# Patient Record
Sex: Male | Born: 1950 | Race: Black or African American | Hispanic: No | Marital: Single | Smoking: Former smoker
Health system: Southern US, Community
[De-identification: ages and names within clinical notes are randomized; demographics above are authoritative.]

## PROBLEM LIST (undated history)

## (undated) ENCOUNTER — Emergency Department (HOSPITAL_COMMUNITY): Admission: EM | Payer: Self-pay | Source: Home / Self Care

## (undated) DIAGNOSIS — N189 Chronic kidney disease, unspecified: Secondary | ICD-10-CM

## (undated) DIAGNOSIS — M549 Dorsalgia, unspecified: Secondary | ICD-10-CM

## (undated) DIAGNOSIS — Z5189 Encounter for other specified aftercare: Secondary | ICD-10-CM

## (undated) DIAGNOSIS — I10 Essential (primary) hypertension: Secondary | ICD-10-CM

## (undated) DIAGNOSIS — D649 Anemia, unspecified: Secondary | ICD-10-CM

## (undated) DIAGNOSIS — E559 Vitamin D deficiency, unspecified: Secondary | ICD-10-CM

## (undated) DIAGNOSIS — E785 Hyperlipidemia, unspecified: Secondary | ICD-10-CM

## (undated) DIAGNOSIS — R7303 Prediabetes: Secondary | ICD-10-CM

## (undated) DIAGNOSIS — I351 Nonrheumatic aortic (valve) insufficiency: Secondary | ICD-10-CM

## (undated) DIAGNOSIS — G8929 Other chronic pain: Secondary | ICD-10-CM

## (undated) DIAGNOSIS — R300 Dysuria: Secondary | ICD-10-CM

## (undated) DIAGNOSIS — N139 Obstructive and reflux uropathy, unspecified: Secondary | ICD-10-CM

## (undated) DIAGNOSIS — A159 Respiratory tuberculosis unspecified: Secondary | ICD-10-CM

## (undated) DIAGNOSIS — K5790 Diverticulosis of intestine, part unspecified, without perforation or abscess without bleeding: Secondary | ICD-10-CM

## (undated) DIAGNOSIS — K648 Other hemorrhoids: Secondary | ICD-10-CM

## (undated) DIAGNOSIS — N390 Urinary tract infection, site not specified: Secondary | ICD-10-CM

## (undated) DIAGNOSIS — R972 Elevated prostate specific antigen [PSA]: Secondary | ICD-10-CM

## (undated) DIAGNOSIS — N289 Disorder of kidney and ureter, unspecified: Secondary | ICD-10-CM

## (undated) DIAGNOSIS — N400 Enlarged prostate without lower urinary tract symptoms: Secondary | ICD-10-CM

## (undated) HISTORY — DX: Hyperlipidemia, unspecified: E78.5

## (undated) HISTORY — DX: Chronic kidney disease, unspecified: N18.9

## (undated) HISTORY — PX: SPLENECTOMY: SUR1306

## (undated) HISTORY — DX: Obstructive and reflux uropathy, unspecified: N13.9

## (undated) HISTORY — PX: COLONOSCOPY: SHX174

## (undated) HISTORY — DX: Other hemorrhoids: K64.8

## (undated) HISTORY — DX: Diverticulosis of intestine, part unspecified, without perforation or abscess without bleeding: K57.90

## (undated) HISTORY — DX: Vitamin D deficiency, unspecified: E55.9

## (undated) HISTORY — DX: Elevated prostate specific antigen (PSA): R97.20

## (undated) HISTORY — DX: Encounter for other specified aftercare: Z51.89

## (undated) HISTORY — PX: CARPAL TUNNEL RELEASE: SHX101

## (undated) HISTORY — PX: HEMORRHOID BANDING: SHX5850

## (undated) HISTORY — DX: Benign prostatic hyperplasia without lower urinary tract symptoms: N40.0

## (undated) HISTORY — PX: PROSTATE SURGERY: SHX751

## (undated) HISTORY — DX: Respiratory tuberculosis unspecified: A15.9

---

## 2001-10-20 ENCOUNTER — Emergency Department (HOSPITAL_COMMUNITY): Admission: EM | Admit: 2001-10-20 | Discharge: 2001-10-20 | Payer: Self-pay | Admitting: Emergency Medicine

## 2003-12-10 ENCOUNTER — Emergency Department (HOSPITAL_COMMUNITY): Admission: EM | Admit: 2003-12-10 | Discharge: 2003-12-10 | Payer: Self-pay | Admitting: Emergency Medicine

## 2004-05-03 ENCOUNTER — Emergency Department (HOSPITAL_COMMUNITY): Admission: EM | Admit: 2004-05-03 | Discharge: 2004-05-03 | Payer: Self-pay | Admitting: Emergency Medicine

## 2004-05-06 ENCOUNTER — Ambulatory Visit: Payer: Self-pay | Admitting: Internal Medicine

## 2004-05-15 ENCOUNTER — Ambulatory Visit: Payer: Self-pay | Admitting: Internal Medicine

## 2004-08-13 ENCOUNTER — Ambulatory Visit: Payer: Self-pay | Admitting: Internal Medicine

## 2004-09-03 ENCOUNTER — Ambulatory Visit: Payer: Self-pay | Admitting: Internal Medicine

## 2004-10-29 ENCOUNTER — Ambulatory Visit: Payer: Self-pay | Admitting: Internal Medicine

## 2005-03-01 ENCOUNTER — Ambulatory Visit: Payer: Self-pay | Admitting: Internal Medicine

## 2005-04-02 ENCOUNTER — Ambulatory Visit: Payer: Self-pay | Admitting: Internal Medicine

## 2005-10-25 ENCOUNTER — Ambulatory Visit: Payer: Self-pay | Admitting: Internal Medicine

## 2006-02-15 ENCOUNTER — Ambulatory Visit: Payer: Self-pay | Admitting: Internal Medicine

## 2006-02-16 ENCOUNTER — Ambulatory Visit: Payer: Self-pay | Admitting: Cardiovascular Disease

## 2006-02-17 ENCOUNTER — Ambulatory Visit: Payer: Self-pay

## 2006-02-21 ENCOUNTER — Emergency Department (HOSPITAL_COMMUNITY): Admission: EM | Admit: 2006-02-21 | Discharge: 2006-02-21 | Payer: Self-pay | Admitting: Emergency Medicine

## 2006-02-21 ENCOUNTER — Ambulatory Visit: Payer: Self-pay | Admitting: Internal Medicine

## 2006-04-01 ENCOUNTER — Ambulatory Visit: Payer: Self-pay | Admitting: Internal Medicine

## 2006-04-01 ENCOUNTER — Ambulatory Visit (HOSPITAL_COMMUNITY): Admission: RE | Admit: 2006-04-01 | Discharge: 2006-04-01 | Payer: Self-pay | Admitting: Internal Medicine

## 2006-04-01 ENCOUNTER — Encounter: Payer: Self-pay | Admitting: Vascular Surgery

## 2006-04-21 ENCOUNTER — Ambulatory Visit: Payer: Self-pay | Admitting: Internal Medicine

## 2006-07-15 ENCOUNTER — Emergency Department (HOSPITAL_COMMUNITY): Admission: EM | Admit: 2006-07-15 | Discharge: 2006-07-15 | Payer: Self-pay | Admitting: Emergency Medicine

## 2006-07-18 ENCOUNTER — Emergency Department (HOSPITAL_COMMUNITY): Admission: EM | Admit: 2006-07-18 | Discharge: 2006-07-18 | Payer: Self-pay | Admitting: Emergency Medicine

## 2006-07-19 ENCOUNTER — Ambulatory Visit: Payer: Self-pay | Admitting: Internal Medicine

## 2006-10-13 ENCOUNTER — Ambulatory Visit: Payer: Self-pay | Admitting: Internal Medicine

## 2006-11-04 ENCOUNTER — Ambulatory Visit: Payer: Self-pay | Admitting: Internal Medicine

## 2006-11-04 LAB — CONVERTED CEMR LAB
BUN: 11 mg/dL (ref 6–23)
CO2: 34 meq/L — ABNORMAL HIGH (ref 19–32)
Calcium: 8.3 mg/dL — ABNORMAL LOW (ref 8.4–10.5)
Chloride: 107 meq/L (ref 96–112)
Cholesterol: 190 mg/dL (ref 0–200)
Creatinine, Ser: 1.3 mg/dL (ref 0.4–1.5)
GFR calc Af Amer: 73 mL/min
GFR calc non Af Amer: 60 mL/min
Glucose, Bld: 90 mg/dL (ref 70–99)
HDL: 46.4 mg/dL (ref >39.0)
LDL Cholesterol: 133 mg/dL — ABNORMAL HIGH (ref 0–99)
Potassium: 3.9 meq/L (ref 3.5–5.1)
Sodium: 144 meq/L (ref 135–145)
Total CHOL/HDL Ratio: 4.1
Triglycerides: 52 mg/dL (ref 0–149)
VLDL: 10 mg/dL (ref 0–40)

## 2006-11-16 ENCOUNTER — Ambulatory Visit: Payer: Self-pay | Admitting: Gastroenterology

## 2006-11-29 ENCOUNTER — Ambulatory Visit: Payer: Self-pay | Admitting: Internal Medicine

## 2006-11-30 ENCOUNTER — Ambulatory Visit: Payer: Self-pay | Admitting: Gastroenterology

## 2007-01-20 ENCOUNTER — Ambulatory Visit: Payer: Self-pay | Admitting: Internal Medicine

## 2007-02-04 ENCOUNTER — Emergency Department (HOSPITAL_COMMUNITY): Admission: EM | Admit: 2007-02-04 | Discharge: 2007-02-04 | Payer: Self-pay | Admitting: Emergency Medicine

## 2007-02-05 ENCOUNTER — Emergency Department (HOSPITAL_COMMUNITY): Admission: EM | Admit: 2007-02-05 | Discharge: 2007-02-05 | Payer: Self-pay | Admitting: Emergency Medicine

## 2007-03-06 ENCOUNTER — Encounter: Payer: Self-pay | Admitting: *Deleted

## 2007-03-06 DIAGNOSIS — I10 Essential (primary) hypertension: Secondary | ICD-10-CM | POA: Insufficient documentation

## 2007-04-07 ENCOUNTER — Encounter: Payer: Self-pay | Admitting: Internal Medicine

## 2007-05-01 ENCOUNTER — Encounter: Payer: Self-pay | Admitting: Internal Medicine

## 2007-05-18 ENCOUNTER — Ambulatory Visit: Payer: Self-pay | Admitting: Internal Medicine

## 2007-10-02 ENCOUNTER — Ambulatory Visit: Payer: Self-pay | Admitting: Internal Medicine

## 2007-10-02 DIAGNOSIS — B351 Tinea unguium: Secondary | ICD-10-CM

## 2007-10-02 DIAGNOSIS — N400 Enlarged prostate without lower urinary tract symptoms: Secondary | ICD-10-CM | POA: Insufficient documentation

## 2007-10-24 ENCOUNTER — Encounter: Payer: Self-pay | Admitting: Internal Medicine

## 2007-12-04 ENCOUNTER — Encounter: Admission: RE | Admit: 2007-12-04 | Discharge: 2007-12-04 | Payer: Self-pay | Admitting: General Practice

## 2007-12-04 ENCOUNTER — Encounter: Payer: Self-pay | Admitting: Internal Medicine

## 2007-12-05 ENCOUNTER — Ambulatory Visit: Payer: Self-pay | Admitting: Internal Medicine

## 2007-12-05 DIAGNOSIS — M159 Polyosteoarthritis, unspecified: Secondary | ICD-10-CM | POA: Insufficient documentation

## 2010-01-29 ENCOUNTER — Ambulatory Visit: Payer: Self-pay | Admitting: Internal Medicine

## 2010-01-29 DIAGNOSIS — R7309 Other abnormal glucose: Secondary | ICD-10-CM

## 2010-01-29 DIAGNOSIS — D693 Immune thrombocytopenic purpura: Secondary | ICD-10-CM

## 2010-01-29 DIAGNOSIS — R609 Edema, unspecified: Secondary | ICD-10-CM

## 2010-01-29 DIAGNOSIS — R9431 Abnormal electrocardiogram [ECG] [EKG]: Secondary | ICD-10-CM

## 2010-01-29 DIAGNOSIS — B353 Tinea pedis: Secondary | ICD-10-CM | POA: Insufficient documentation

## 2010-02-05 ENCOUNTER — Telehealth (INDEPENDENT_AMBULATORY_CARE_PROVIDER_SITE_OTHER): Payer: Self-pay | Admitting: Internal Medicine

## 2010-02-05 ENCOUNTER — Ambulatory Visit: Payer: Self-pay | Admitting: Internal Medicine

## 2010-02-05 ENCOUNTER — Ambulatory Visit: Admission: RE | Admit: 2010-02-05 | Discharge: 2010-02-05 | Payer: Self-pay | Admitting: Internal Medicine

## 2010-02-05 ENCOUNTER — Encounter (INDEPENDENT_AMBULATORY_CARE_PROVIDER_SITE_OTHER): Payer: Self-pay | Admitting: Internal Medicine

## 2010-02-05 ENCOUNTER — Ambulatory Visit: Payer: Self-pay | Admitting: Cardiology

## 2010-02-05 DIAGNOSIS — I498 Other specified cardiac arrhythmias: Secondary | ICD-10-CM | POA: Insufficient documentation

## 2010-02-11 ENCOUNTER — Encounter (INDEPENDENT_AMBULATORY_CARE_PROVIDER_SITE_OTHER): Payer: Self-pay | Admitting: Internal Medicine

## 2010-03-05 ENCOUNTER — Encounter (HOSPITAL_COMMUNITY)
Admission: RE | Admit: 2010-03-05 | Discharge: 2010-03-13 | Payer: Self-pay | Source: Home / Self Care | Admitting: Cardiology

## 2010-03-30 ENCOUNTER — Ambulatory Visit: Payer: Self-pay | Admitting: Internal Medicine

## 2010-04-07 ENCOUNTER — Ambulatory Visit: Payer: Self-pay | Admitting: Internal Medicine

## 2010-06-02 ENCOUNTER — Ambulatory Visit: Payer: Self-pay | Admitting: Internal Medicine

## 2010-07-05 LAB — CONVERTED CEMR LAB
BUN: 15 mg/dL (ref 6–23)
Bilirubin Urine: NEGATIVE
Blood Glucose, Fingerstick: 82
Blood in Urine, dipstick: NEGATIVE
CO2: 32 meq/L (ref 19–32)
Calcium: 9.5 mg/dL (ref 8.4–10.5)
Chloride: 104 meq/L (ref 96–112)
Creatinine, Ser: 1.25 mg/dL (ref 0.40–1.50)
Glucose, Bld: 81 mg/dL (ref 70–99)
Glucose, Urine, Semiquant: NEGATIVE
Hgb A1c MFr Bld: 5.7 %
Ketones, urine, test strip: NEGATIVE
Nitrite: NEGATIVE
Potassium: 3.8 meq/L (ref 3.5–5.3)
Protein, U semiquant: NEGATIVE
Sodium: 145 meq/L (ref 135–145)
Specific Gravity, Urine: 1.01
Urobilinogen, UA: 0.2
pH: 7

## 2010-07-09 NOTE — Letter (Signed)
Summary: *Referral Letter  [facility]  [address] [place], [place] [address]   Phone: [phone]  Fax: [phone]    01/29/2010  Thank you in advance for agreeing to see my patient:  [name] [address] [place], [place]  [address]  Phone: [phone]  Reason for Referral: New patient with long history of hypertension.  Has been off meds for at least many months, if not longer.  Does have sternal CP at times, but not really related to exertion and he does lift weights and walk regularly.  EKG shows Twave changes anterolaterally with borderline LVH.  Not clear if strain or ischemic in nature.  Echo ordered.  Father with probable hx of MI in [place].  Procedures Requested: Evaluation/risk stratification.  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) 5)  DEGENERATIVE JOINT DISEASE, KNEES, BILATERAL (ICD-715.96) 6)  ONYCHOMYCOSIS, TOENAILS (ICD-110.1) 7)  BENIGN PROSTATIC HYPERTROPHY (ICD-600.00) 8)  POSITIVE PPD (ICD-795.5) 9)  HYPERTENSION (ICD-401.9)   Current Medications: 1)  AMLODIPINE BESYLATE 10 MG  TABS (AMLODIPINE BESYLATE) once daily 2)  METOPROLOL TARTRATE 100 MG  TABS (METOPROLOL TARTRATE) Take 1 tablet by mouth once a day 3)  HYDROCHLOROTHIAZIDE 25 MG  TABS (HYDROCHLOROTHIAZIDE) 1 by mouth once daily 4)  FINASTERIDE 5 MG  TABS (FINASTERIDE) 1 by mouth once daily 5)  LOTRIMIN ULTRA 1 % CREA (BUTENAFINE HCL) apply two times a day to feet after washing and drying.   Past Medical History: 1)  POSITIVE PPD (ICD-795.5) 2)  HYPERTENSION (ICD-401.9) 3)  Benign prostatic hypertrophy 4)  hematuria 5)  Physician roster: 6)                   [facility]- Dr. Wanda Plump.   Prior History of Blood Transfusions:   Pertinent Labs:    Thank you again for agreeing to see our patient; please contact us if you  have any further questions or need additional information.  Sincerely,  Julieanne Manson MD

## 2010-07-09 NOTE — Assessment & Plan Note (Signed)
Summary: recheck bp per Dr.Mulberry..cm   Primary Care Provider:  Norins   History of Present Illness: Pt. left before being seen--had to use the bathroom and wanted to be home (this info when he returned)  Allergies: No Known Drug Allergies   Complete Medication List: 1)  Amlodipine Besylate 10 Mg Tabs (Amlodipine besylate) .... Once daily 2)  Metoprolol Tartrate 100 Mg Tabs (Metoprolol tartrate) .... Take 1 tablet by mouth once a day 3)  Hydrochlorothiazide 25 Mg Tabs (Hydrochlorothiazide) .Marland Kitchen.. 1 by mouth once daily 4)  Finasteride 5 Mg Tabs (Finasteride) .Marland Kitchen.. 1 by mouth once daily 5)  Lotrimin Ultra 1 % Crea (Butenafine hcl) .... Apply two times a day to feet after washing and drying.

## 2010-07-09 NOTE — Letter (Signed)
Summary: TRANSTHORACIC ECHOCARDIOGRAPHY  TRANSTHORACIC ECHOCARDIOGRAPHY   Imported By: Arta Bruce 02/12/2010 09:42:03  _____________________________________________________________________  External Attachment:    Type:   Image     Comment:   External Document

## 2010-07-09 NOTE — Progress Notes (Signed)
Summary: cardiology referral   Phone Note Outgoing Call   Summary of Call: Nora--see OV from 8/25 and referral letter--needs sooner rather than later. Initial call taken by: Julieanne Manson MD,  February 05, 2010 2:24 PM  Follow-up for Phone Call        *PT HAVE AN APPT [facility] @ 1:30PM ARRIVAL @ 1PM  [address] PH # [phone]  I CALL PT PH N/A  Follow-up by: Cheryll Dessert,  February 05, 2010 3:52 PM

## 2010-07-09 NOTE — Assessment & Plan Note (Signed)
Summary: NP-DM/HTN /TMM   Vital Signs:  Patient profile:   60 year old male Height:      72 inches Weight:      264.8 pounds BMI:     36.04 Temp:     97.6 degrees F oral Pulse rate:   82 / minute Pulse rhythm:   regular Resp:     18 per minute BP sitting:   160 / 110  (right arm) Cuff size:   large  Vitals Entered By: Michelle Nasuti (January 29, 2010 11:23 AM) CC: PT PRESENTS TO [facility] TO RE-ESTABLISH CARE. HX OF DM AND HTN W/O MEDS X 3 MONTHS Pain Assessment Patient in pain? no      CBG Result 82 CBG Device ID B FASTING  Does patient need assistance? Functional Status Self care Ambulation Normal   Primary Care Provider:  Norins  CC:  PT PRESENTS TO [facility] TO RE-ESTABLISH CARE. HX OF DM AND HTN W/O MEDS X 3 MONTHS.  History of Present Illness: 60 yo male here to establish--contrary to above info, has never been seen here before per pt.  1.  Hyperglycemia:  This diagnosis per pt.  " A trace of it."  Not able to find any elevated sugars in this chart dating back to 2007.  Mother with mild sugar problems in [place], but this when she was quite old per pt.  Pt. gives history of chronic tooth infection and higher weight when he feels sugars were higher.  Had 2 teeth removed 08/2008 with resolution of infection, though does have a chronic swelling in right submandibular area he relates to the infection.  2.  Hypertension:  for at least 18 years.  Off meds for 3 months.  Has been losing well.  Walks regularly and doing some weight lifting.  Has changed diet as well.  Tries to eat a lot of fruit and vegetables.  Avoids sweetened drinks.  3.  Hx of BPH:  was on Finasteride.  Nocturia 2 x nightly.  Stream decreased and does have hesitancy and needs to strain for urination at times.  4.  Flaking of feet with invlovement of some toenails.  Has pigmentation changes in feet secondary to this.  5.  Peripheral edema:  chronic per pt., especially off meds.  Habits &  Providers  Alcohol-Tobacco-Diet     Tobacco Status: never  Allergies (verified): No Known Drug Allergies  Past History:  Past Surgical History: 1.  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 2 Brothers:  1 died--paternal brother:  stroke, possible MI.  Maternal brother:  healthy Sister:  healthy Son, 21:  Healthy, allergies Daughter, 18:  healthy  Social History: From [place] where he was very poor. Secondary education  in his home country.  Originally from [place]. [place] 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 Married, but wife in [place]. Pt. states somehow information was that he had a wife here, which he does not, and have been unable to get a visa for wife because of this. 2 Children live in [place] with [place]. Does not have another partner here. Tobacco:  Rare Cigar.  No cigarettes since 1983 Alcohol:  Rare. Drugs:   Never.Smoking Status:  never  Review of Systems CV:  Mild  midsternal chest pain when directly asked--usually occurs when not physically active--does not have with weight lifting or walking.Marland Kitchen  Physical Exam  General:  overweight male, NAD Neck:  3-4 cm cystic lesion in right submandibular area just below angle of jaw  NT. Lungs:  Normal respiratory effort, chest expands symmetrically. Lungs are clear to auscultation, no crackles or wheezes. Heart:  Normal rate and regular rhythm. S1 and S2 normal without gallop, murmur, click, rub or other extra sounds.  Radial pulses normal and equal Extremities:  mild edema bilaterally  Diffuse flaking of feet--in between toes as well with blotchy pigmentation to same area. A few toenails with thickening and  yellowing.   Impression & Recommendations:  Problem # 1:  HYPERTENSION (ICD-401.9)  Not controlled--restart meds. His updated medication list for this problem includes:    Amlodipine Besylate 10 Mg Tabs (Amlodipine besylate) ..... Once daily    Metoprolol Tartrate 100 Mg Tabs (Metoprolol tartrate) .Marland Kitchen... Take 1 tablet by mouth once a day    Hydrochlorothiazide 25 Mg Tabs (Hydrochlorothiazide) .Marland Kitchen... 1 by mouth once daily  Orders: T-Basic Metabolic Panel [phone]) UA Dipstick w/o Micro (manual) (09811)  Problem # 2:  ABNORMAL ELECTROCARDIOGRAM (ICD-794.31) Flipped T waves anterolaterally Suspect just strain with borderline LVH, but have asked pt. to start baby aspirin daily  Send for echo and refer to Cardiolgy for risk stratification Restart meds as above  Orders: Cardiology Referral (Cardiology) 2 D Echo (2 D Echo)  Problem # 3:  BENIGN PROSTATIC HYPERTROPHY (ICD-600.00) Restart Finasteride  Problem # 4:  TINEA PEDIS (ICD-110.4)  His updated medication list for this problem includes:    Lotrimin Ultra 1 % Crea (Butenafine hcl) .Marland Kitchen... Apply two times a day to feet after washing and drying.  Problem # 5:  HYPERGLYCEMIA (ICD-790.29) No findings in old chart to support, though A1C today in upper limits of normal Pt. to continue to work on weight issues and diet.  Complete Medication List: 1)  Amlodipine Besylate 10 Mg Tabs (Amlodipine besylate) .... Once daily 2)  Metoprolol Tartrate 100 Mg Tabs (Metoprolol tartrate) .... Take 1 tablet by mouth once a day 3)  Hydrochlorothiazide 25 Mg Tabs (Hydrochlorothiazide) .Marland Kitchen.. 1 by mouth once daily 4)  Finasteride 5 Mg Tabs (Finasteride) .Marland Kitchen.. 1 by mouth once daily 5)  Lotrimin Ultra 1 % Crea (Butenafine hcl) .... Apply two times a day to feet after washing and drying.  Other Orders: Capillary Blood Glucose/CBG (91478)  Patient Instructions: 1)  Release of information from [facility] 2)  Visit with Dutch Quint for bp check  after restart of meds. 3)  Follow up with Dr. Delrae Alfred in 3-4  months for CPE. 4)  Spray shoes with Lysol and allow to dry daily--alternate use of shoes every day Prescriptions: LOTRIMIN ULTRA 1 % CREA (BUTENAFINE HCL) apply two times a day to feet after washing and drying.  #60g x 2   Entered and Authorized by:   Julieanne Manson MD   Signed by:   Julieanne Manson MD on 01/29/2010   Method used:   Faxed to ...       [facility] (retail)       [address] [place], [place]  [address]       Ph: [phone]       Fax: [phone]   RxID:   [id number] FINASTERIDE 5 MG  TABS (FINASTERIDE) 1 by mouth once daily  #30 x 11  Entered and Authorized by:   Julieanne Manson MD   Signed by:   Julieanne Manson MD on 01/29/2010   Method used:   Faxed to ...       [facility] - [facility] (retail)       [address] [place], [place]  [address]       Ph: [phone]       Fax: [phone]   RxID:   [id number] HYDROCHLOROTHIAZIDE 25 MG  TABS (HYDROCHLOROTHIAZIDE) 1 by mouth once daily  #30 x 11   Entered and Authorized by:   Julieanne Manson MD   Signed by:   Julieanne Manson MD on 01/29/2010   Method used:   Faxed to ...       [facility] - [facility] (retail)       [address] [place], [place]  [address]       Ph: [phone]       Fax: [phone]   RxID:   [id number] METOPROLOL TARTRATE 100 MG  TABS (METOPROLOL TARTRATE) Take 1 tablet by mouth once a day  #30.0 Each x 11   Entered and Authorized by:   Julieanne Manson MD   Signed by:   Julieanne Manson MD on 01/29/2010   Method used:   Faxed to ...       [facility] - [facility] (retail)       [address] [place], [place]  [address]       Ph: [phone]       Fax: [phone]   RxID:   [id number] AMLODIPINE BESYLATE 10 MG  TABS (AMLODIPINE  BESYLATE) once daily  #30 x 11   Entered and Authorized by:   Julieanne Manson MD   Signed by:   Julieanne Manson MD on 01/29/2010   Method used:   Faxed to ...       [facility] - [facility] (retail)       [address] [place], [place]  [address]       Ph: [phone]       Fax: [phone]   RxID:   [id number]      Last LDL:                                                 133 (11/04/2006 9:50:00 AM)        Diabetic Foot Exam    10-g (5.07) Semmes-Weinstein Monofilament Test Performed by: Michelle Nasuti          Right Foot          Left Foot Visual Inspection     normal           normal Test Control      normal         normal Site 1         normal         normal Site 2         normal         normal Site 3         normal         normal Site 4  normal         normal Site 5         normal         normal Site 6         normal         abnormal Site 7         abnormal         normal Site 8         abnormal         abnormal Site 9         abnormal         abnormal   Laboratory Results   Urine Tests  Date/Time Received: January 29, 2010 11:32 AM   Routine Urinalysis   Color: lt. yellow Appearance: Clear Glucose: negative   (Normal Range: Negative) Bilirubin: negative   (Normal Range: Negative) Ketone: negative   (Normal Range: Negative) Spec. Gravity: 1.010   (Normal Range: 1.003-1.035) Blood: negative   (Normal Range: Negative) pH: 7.0   (Normal Range: 5.0-8.0) Protein: negative   (Normal Range: Negative) Urobilinogen: 0.2   (Normal Range: 0-1) Nitrite: negative   (Normal Range: Negative) Leukocyte Esterace: trace   (Normal Range: Negative)     Blood Tests     HGBA1C: 5.7%   (Normal Range: Non-Diabetic - 3-6%   Control Diabetic - 6-8%) CBG Random:: 82      Appended Document: NP-DM/HTN /TMM    Clinical Lists Changes

## 2010-07-09 NOTE — Progress Notes (Signed)
Summary: Elvated BP at Echo Lab  Phone Note From Other Clinic   Summary of Call: Pt is in Echo Lab and bp was first 170/110 after sitting and resting bp was 180/118 HR 40. please advise as pt is in lobby at Echo lab Initial call taken by: Michelle Nasuti,  February 05, 2010 10:08 AM  Follow-up for Phone Call        per vo Dr. Delrae Alfred pt is to retun to [facility] for triage visit./provider visit denies sob, dizziness c/o mild chest pain pt did take his meds this am Pt states his Metoprolol was at 100mg  "before" currently at 50mg . advised pt to return to [facility] for further evalutation Follow-up by: Michelle Nasuti,  February 05, 2010 10:33 AM  Additional Follow-up for Phone Call Additional follow up Details #1::        Pt. was to be put in with triage nurse. Placed in my schedule as an opening occurred, but pt. left before could be seen.   He has been contacted by Leda Min and asked to return--reportedly was planning to, but have not yet seen him and unable to contact him currently--no answer to phone Additional Follow-up by: Julieanne Manson MD,  February 05, 2010 1:48 PM    Additional Follow-up for Phone Call Additional follow up Details #2::    Called echo--pt's pulse in 40s there--pt. denied any concerning symptoms at the time.  To check echart--echo was to be read before lunch.   And--pt. just walked in the front door to be seen. Follow-up by: Julieanne Manson MD,  February 05, 2010 2:08 PM

## 2010-07-09 NOTE — Assessment & Plan Note (Signed)
Summary: blood pressure check///mc   Nurse Visit   Vital Signs:  Patient profile:   60 year old male Pulse rate:   72 / minute Pulse rhythm:   regular Resp:     20 per minute BP sitting:   156 / 116  (right arm) Cuff size:   large  Vitals Entered By: Dutch Quint RN (March 30, 2010 11:02 AM)  Patient Instructions: 1)  Reviewed with [name] 2)  Return in two weeks for blood pressure check with triage nurse - come on Tuesday, Thursday or Friday so that it can be reviewed with Dr. Delrae Alfred. 3)  Make sure you take all of your medications before you come. 4)  You received your flu vaccine today. 5)  Call if you have any changes or if you have any questions.   Primary Care Provider:  Norins  CC:  BP check .  History of Present Illness: Not taking HCTZ right now, makes him tired.  Also taking finasteride; took finasteride this morning, has not taken other meds.  Denies headache, slight cough, no visual changes.  Wants RX for BP monitor kit and Aqua-check [facility] to check CBGs.  CC: BP check  Is Patient Diabetic? No Pain Assessment Patient in pain? no       Does patient need assistance? Functional Status Self care Ambulation Normal   Review of Systems CV:  Complains of fatigue and swelling of feet; Sometimes slight dizziness comes briefly at times.  .   Physical Exam  Lungs:  normal respiratory effort, normal breath sounds, no crackles, and no wheezes.   Heart:  normal rate and regular rhythm.     Impression & Recommendations:  Problem # 1:  HYPERTENSION (ICD-401.9) Had not taken meds before visit  -- BP 156/116 To return in two weeks for BP check with triage nurse on a Tuesday, Thursday or Friday when Dr. Delrae Alfred is here To take all meds before visit.  His updated medication list for this problem includes:    Amlodipine Besylate 10 Mg Tabs (Amlodipine besylate) ..... Once daily    Metoprolol Tartrate 100 Mg Tabs (Metoprolol tartrate) .Marland Kitchen... Take 1 tablet by  mouth once a day    Hydrochlorothiazide 25 Mg Tabs (Hydrochlorothiazide) .Marland Kitchen... 1 by mouth once daily  Complete Medication List: 1)  Amlodipine Besylate 10 Mg Tabs (Amlodipine besylate) .... Once daily 2)  Metoprolol Tartrate 100 Mg Tabs (Metoprolol tartrate) .... Take 1 tablet by mouth once a day 3)  Hydrochlorothiazide 25 Mg Tabs (Hydrochlorothiazide) .Marland Kitchen.. 1 by mouth once daily 4)  Finasteride 5 Mg Tabs (Finasteride) .Marland Kitchen.. 1 by mouth once daily 5)  Lotrimin Ultra 1 % Crea (Butenafine hcl) .... Apply two times a day to feet after washing and drying. 6)  Nitrostat 0.4 Mg Subl (Nitroglycerin) .Marland Kitchen.. 1 tab sublingual as needed chest pain.  may repeat every 5 minutes x2 if pain continues  Other Orders: Flu Vaccine 39yrs + (11914) Admin 1st Vaccine (78295)    Allergies: No Known Drug Allergies  Immunizations Administered:  Influenza Vaccine # 1:    Vaccine Type: Fluvax 3+    Site: right deltoid    Mfr: GlaxoSmithKline    Dose: 0.5 ml    Route: IM    Given by: Dutch Quint RN    Exp. Date: 12/05/2010    Lot #: [id number]    VIS given: 12/30/09 version given March 30, 2010.  Flu Vaccine Consent Questions:    Do you have a history of severe allergic reactions  to this vaccine? no    Any prior history of allergic reactions to egg and/or gelatin? no    Do you have a sensitivity to the preservative Thimersol? no    Do you have a past history of Guillan-Barre Syndrome? no    Do you currently have an acute febrile illness? no    Have you ever had a severe reaction to latex? no    Vaccine information given and explained to patient? yes  Orders Added: 1)  Flu Vaccine 82yrs + [90658] 2)  Admin 1st Vaccine [90471] 3)  Est. Patient Level I [40102]  Prevention & Chronic Care Immunizations   Influenza vaccine: Fluvax 3+  (03/30/2010)    Tetanus booster: Not documented    Pneumococcal vaccine: Not documented    H. zoster vaccine: Not documented  Colorectal Screening   Hemoccult: Not  documented    Colonoscopy: Not documented  Other Screening   PSA: Not documented   Smoking status: never  (01/29/2010)  Lipids   Total Cholesterol: 190  (11/04/2006)   LDL: 133  (11/04/2006)   LDL Direct: Not documented   HDL: 46.4  (11/04/2006)   Triglycerides: 52  (11/04/2006)  Hypertension   Last Blood Pressure: 156 / 116  (03/30/2010)   Serum creatinine: 1.25  (01/29/2010)   Serum potassium 3.8  (01/29/2010)  Self-Management Support :    Hypertension self-management support: Not documented

## 2010-07-09 NOTE — Letter (Signed)
Summary: PT INFORMATION SHEET  PT INFORMATION SHEET   Imported By: Arta Bruce 01/30/2010 12:31:40  _____________________________________________________________________  External Attachment:    Type:   Image     Comment:   External Document

## 2010-07-09 NOTE — Assessment & Plan Note (Signed)
Summary: BP CHECKA DN EKG//KT   Primary Care Provider:  Norins   History of Present Illness: See previous phone note --pt. was having echo done in echo lab today and noted to have heart rate in 40s and elevated bp--worsened after had time to rest.  Pt. states he had hurried there from home.  Pt. admits to some family issues at home that are stressing him, the fact that he is not working all worrying him as well.  Pt. denies any accompanying dizziness, chest pain, dyspnea, headache associated with low pulse and elevated bp this morning.  He does admit to occasional discomfort in left chest, which was not a complaint last week when seen.  He does not yet have a cardiology appt.   Echo is not yet read.    Pt. initially  Allergies: No Known Drug Allergies  Physical Exam  General:  NAD Lungs:  Normal respiratory effort, chest expands symmetrically. Lungs are clear to auscultation, no crackles or wheezes. Heart:  Normal rate and regular rhythm. S1 and S2 normal without gallop, murmur, click, rub or other extra sounds.  Radial pulses normal and equal   Impression & Recommendations:  Problem # 1:  HYPERTENSION (ICD-401.9) Pt. NOT taking meds as asked over phone when at echo lab, though he does have all of them.  To get started on all meds today. EKG today is unchanged. NTG for chest discomfort. Await cardiology visit. to start baby aspirin daily His updated medication list for this problem includes:    Amlodipine Besylate 10 Mg Tabs (Amlodipine besylate) ..... Once daily    Metoprolol Tartrate 100 Mg Tabs (Metoprolol tartrate) .Marland Kitchen... Take 1 tablet by mouth once a day    Hydrochlorothiazide 25 Mg Tabs (Hydrochlorothiazide) .Marland Kitchen... 1 by mouth once daily  Problem # 2:  BRADYCARDIA (ICD-427.89)  HR fine here--secondary to beta blockade and similar rate to what he had last week on Metoprolol. His updated medication list for this problem includes:    Metoprolol Tartrate 100 Mg Tabs (Metoprolol  tartrate) .Marland Kitchen... Take 1 tablet by mouth once a day  Orders: EKG w/ Interpretation (93000)  Complete Medication List: 1)  Amlodipine Besylate 10 Mg Tabs (Amlodipine besylate) .... Once daily 2)  Metoprolol Tartrate 100 Mg Tabs (Metoprolol tartrate) .... Take 1 tablet by mouth once a day 3)  Hydrochlorothiazide 25 Mg Tabs (Hydrochlorothiazide) .Marland Kitchen.. 1 by mouth once daily 4)  Finasteride 5 Mg Tabs (Finasteride) .Marland Kitchen.. 1 by mouth once daily 5)  Lotrimin Ultra 1 % Crea (Butenafine hcl) .... Apply two times a day to feet after washing and drying. 6)  Nitrostat 0.4 Mg Subl (Nitroglycerin) .Marland Kitchen.. 1 tab sublingual as needed chest pain.  may repeat every 5 minutes x2 if pain continues  Patient Instructions: 1)  Keep nurse visit appt. on the 7th of September. Prescriptions: NITROSTAT 0.4 MG SUBL (NITROGLYCERIN) 1 tab sublingual as needed chest pain.  May repeat every 5 minutes x2 if pain continues  #24 x 1   Entered and Authorized by:   Julieanne Manson MD   Signed by:   Julieanne Manson MD on 02/05/2010   Method used:   Faxed to ...       [facility] - [facility] (retail)       [address] [place], [place]  [address]       Ph: [phone]       Fax: [phone]   RxID:   [id number]

## 2010-08-01 ENCOUNTER — Encounter (INDEPENDENT_AMBULATORY_CARE_PROVIDER_SITE_OTHER): Payer: Self-pay | Admitting: Internal Medicine

## 2010-08-03 ENCOUNTER — Encounter (INDEPENDENT_AMBULATORY_CARE_PROVIDER_SITE_OTHER): Payer: Self-pay | Admitting: Internal Medicine

## 2010-08-04 NOTE — Miscellaneous (Signed)
Summary: Cardiac note update  Clinical Lists Changes  Observations: Added new observation of MIBI STRESS: Dr. Harmon Dun Cardiology:  Reassuring, no ischemia.  EF 45% (02/11/2010 15:15)

## 2010-08-13 NOTE — Miscellaneous (Signed)
Summary: Cardiology update  Clinical Lists Changes  Medications: Added new medication of LISINOPRIL 20 MG TABS (LISINOPRIL) 1 tab by mouth daily --Dr. Anne Fu, Cardiology

## 2010-08-13 NOTE — Letter (Signed)
Summary: [facility]//ABNORMAL EKG/ANTEROLATERAL ISCHEMIA  [facility]//ABNORMAL EKG/ANTEROLATERAL ISCHEMIA   Imported By: Arta Bruce 08/04/2010 14:32:35  _____________________________________________________________________  External Attachment:    Type:   Image     Comment:   External Document

## 2010-08-18 NOTE — Letter (Signed)
Summary: [facility]//ABNORMAL EKG  [facility]//ABNORMAL EKG   Imported By: Arta Bruce 08/11/2010 08:29:40  _____________________________________________________________________  External Attachment:    Type:   Image     Comment:   External Document

## 2010-10-20 NOTE — Assessment & Plan Note (Signed)
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  He  reports he has been having some low grade fevers.  He has been having  some suprapubic and peroneal pain and discomfort.  He has had this  problem in the past.   Patient is reliable.  It is my impression that he might actually have  prostatitis as the cause of his hematuria.   PLAN:  Cipro 250 mg b.i.d. for 14 days.  He is to notify me if his  symptoms do not improve.     Rosalyn Gess Norins, MD  Electronically Signed    MEN/MedQ  DD: 02/04/2007  DT: 02/05/2007  Job #: [id number]

## 2010-10-23 NOTE — Consult Note (Signed)
NAMECOLIE, FUGITT                ACCOUNT NO.:  [id number]   MEDICAL RECORD NO.:  [id number]          PATIENT TYPE:  EMS   LOCATION:  [facility]                         FACILITY:  [facility]   PHYSICIAN:  Rosalyn Gess. 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  The patient was seen on February 15, 2006, in the primary care office where he presented with and discomfort.  Please see that note for complete Past Medical History, Family History,  Social History.  Because of abnormal EKG and discomfort, he was seen  September 12 in the early a.m. by Dr. Excell Seltzer with [facility].  At  that point, it was felt that the patient probably had atypical chest pain,  low probability for angina.  It was felt that the EKG changes were secondary  to left ventricular hypertrophy from a longstanding history of hypertension.  The patient did subsequently come to an adenosine Cardiolite study, the  results of which are not available to me, but the patient never received a  call indicating any positive findings.   The patient called the office late this afternoon to report he was once  again having significant pain in his left chest with some pain in his left  arm and axilla region.  Because of the late hour, he was referred to the ER  for acute evaluation.   Again, please refer to prior dictations for Past Medical History, Family  History, Social History, which was reviewed with the patient for accuracy  and any additions or corrections.   CURRENT MEDICATIONS:  1. Norvasc 10 mg daily.  2. Metoprolol 100 mg daily.  3. Doxazosin 4 mg daily.   PHYSICAL EXAMINATION:  VITAL SIGNS:  Temperature 98.5, blood pressure  132/89, pulse 56, respirations 12, O2 saturation 94%.  GENERAL APPEARANCE:  This  is large boned, overweight [place] gentleman who is  in no acute distress.  CHEST:  Patient is moving air well with no rales, wheezes or rhonchi.  CARDIOVASCULAR: Patient had 2+ radial pulses. No JVD or carotid bruits. He  had a quiet precordium with a regular rate and rhythm without murmurs, rubs  or gallops.  ABDOMEN: Protuberant, soft with a ventral surgical scar that is well healed.  He had no tenderness to deep palpation.   DATABASE:  Chemistries were unremarkable with a potassium 3.6, creatinine  1.4, glucose of 84. Hemoglobin was 15.4 grams. White count was normal.  CK  was negative.  Troponin I was less than 0.05. A 12-lead electrocardiogram  revealed the patient to have sinus bradycardia with a T-wave abnormalities  that were unchanged from previous studies.   Chest x-ray was unremarkable.   FINAL ASSESSMENT:  Noncardiac chest pain.  Patient with probable GI origin  of his discomfort, although this may be musculoskeletal in nature.  He does  not require further cardiac evaluation  at this time.   PLAN:  1. The patient is being discharged from the emergency department.  2. He will be started on Nexium 40 mg p.o. q.a.m.  3. He will be started on Relafen 1000 mg nightly with GI precautions.  4. The patient will need to follow up with Dr. Debby Bud in the office in 1      week.           ______________________________  Rosalyn Gess. Norins, MD     MEN/MEDQ  D:  02/21/2006  T:  02/22/2006  Job:  [id number]

## 2010-10-23 NOTE — Assessment & Plan Note (Signed)
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   The patient reports that he developed some low back pain after walking  approximately three days ago.  He has self-medicated with Tylenol and has  had significant relief of his discomfort.  The patient reports he has had  episodes intermittently of left chest discomfort, which he describes as more  of a heavy-type pain than a lancinating discomfort.  He has had no shortness  of breath or diaphoresis with this.  He has had no exertional component with  this.   CARDIAC RISK FACTORS:  1. Male gender.  2. Hypertension.  3. Obesity.  4. Hyperlipidemia with an LDL cholesterol of 129, last checked May 21, 2003.   PAST SURGICAL HISTORY:  1. Splenectomy for idiopathic cytopenic purpura.  2. Lymph node excision.   PAST MEDICAL HISTORY:  1. Usual childhood diseases.  2. Hypertension.  3. Evaluation for cardiovascular disease with a stress Cardiolite in 1996      that was normal.  2D echocardiogram, which revealed a normal left      ventricular function and normal ejection fraction.  4. 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  5. Multiple episodes of bronchitis.  6. Pruritic skin lesions, followed by Dr. Terri Piedra.   CURRENT MEDICATIONS:  1. Norvasc 10 mg daily.  2. Metoprolol 100 mg daily.  3. Doxazosin 4 mg daily.   FAMILY HISTORY:  No history of heart disease or diabetes known.   SOCIAL HISTORY:  The patient is a single, divorced male.  He has finished  his Ph.D. 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  He  has family in  [place], whom he sees periodically.   PHYSICAL EXAM:  GENERAL:  Temperature was 99, blood pressure 128/79, pulse  64, weight 270 pounds, height 6'1.  Appearance is a very large, over-weight  African gentleman in no acute distress.  CHEST:  The patient is moving air well with no rales, wheezes, or rhonchi.  BACK:  The patient had no CVA tenderness.  He can do straight leg maneuver  in the sitting position.  He had normal gait and station.  He had normal  DTRs at the patellar tendons.  CARDIOVASCULAR:  2+ radial pulse.  Quiet precordium with a regular rate and  rhythm.  I appreciated no murmurs, rubs, or gallops.  The patient did have  some minor tenderness to palpation of the chest wall.   12-lead echocardiogram revealed the patient to have a sinus bradycardia.  He  had T-wave inversion in leads V3-V6 and also T-wave inversion in leads II  and III, all suggestive of anterolateral ischemia, possible inferior  ischemia.  ASSESSMENT AND PLAN:  1. Back pain.  The patient's back pain is probably mild muscle strain.  He      is doing much better at this time.  There are no radicular symptoms.      Continue with Tylenol.  2. Hypertension.  The patient's blood pressure is adequately controlled at      128/79 on his present medical regimen.  He will continue the same.  3. Cardiovascular.  The patient with atypical chest pain.  He seems      medically and hemodynamically stable, and did not require      hospitalization at this point.  However, the patient has significant      cardiac risk factors, as well as an abnormal EKG.  His EKG today was      compared to the last EKG from May 21, 2003 with no significant      change.  In addition, the patient did have a stress study as noted.      However, I am concerned about the patient's cardiac status. The patient      will be referred to the doctor today at [facility], and will      make this referral on Wednesday, February 16, 2006 in  the a.m. for an      afternoon appointment.  The patient is advised to take aspirin 325 mg      tonight and daily.  He is given warning signs of increased chest pain,      chest pressure, discomfort with radiation to his arm or neck, or      diaphoresis, and/or shortness of breath.  If these should occur, he is      to go to [facility] urgently.                                   Rosalyn Gess Norins, MD   MEN/MedQ  DD:  02/15/2006  DT:  02/15/2006  Job #:  [id number]

## 2010-10-23 NOTE — Letter (Signed)
February 16, 2006     Rosalyn Gess. Norins, MD  [address]  [place], [place] [address]   RE:  [name], [name]  MRN:  [id number]  /  DOB:  06/24/1949   Dear Dr. Debby Bud:   It was my pleasure to see [name] in the Cardiology Clinic this  morning.  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  He  describes a 5-day history of pain in the left chest as well as the left  upper arm that feels like a burning sensation.  The pain has been  intermittent over this time period and is unrelated to exertion.  He has not  done any heavy exertion but he has done some walking, over the past several  days and this does not increase his pain.  He has a history of similar pain  in the past but he was more concerned about this episode as he is now older  and has some additional family stresses that have been worrying him.  He  complains of some intermittent dizziness but has no other associated  symptoms.  He specifically denies dyspnea, syncope, palpitations, orthopnea,  PND or edema.   In reviewing his chart it appears you have been treating him for several  years for hypertension, which was poorly controlled many years ago but looks  like it has been under good control under your care.   PAST MEDICAL HISTORY:  1. Is pertinent for a splenectomy in 1988.  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  2. Essential hypertension as described above.  3. He has a history of LVH by EKG criteria and has been treated for      several years for hypertension.   He has had no other surgeries or hospitalizations.   FAMILY HISTORY:  His mother died at age 75 of old age.  His father died of  unknown causes.  He has a brother who died at age 75 of sudden death [place];  the details are unknown.  He has a sister who is alive and well.   SOCIAL HISTORY:  Patient is single.  He has two children.  He does not smoke  cigarettes, he quit back in 1983.  He does not use recreational drugs and  does not drink alcohol.  He drinks only rare caffeine. He is not engaged in  any formal exercise program.   REVIEW OF SYSTEMS:  A complete 12-point review of systems was performed.  The only positive findings were hemorrhoids with occasional bleeding,  fatigue and anxiety.  All other systems were reviewed and were negative  except as described above.   MEDICATIONS:  1. Norvasc 10 mg daily.  2. Metoprolol 100 mg daily.  3. Aspirin 325 mg daily.  4. Flomax 0.4 mg daily.   ALLERGIES:  No known drug allergies.   PHYSICAL EXAMINATION:  GENERAL:  The patient is alert and oriented.  He is  in no acute distress.  VITAL SIGNS:  Height is 6 feet 2 inches.  Weight is 270 pounds.  Blood  pressure is 122/84.  Heart rate is 60.  Respiratory rate is 12.  EYES:  Sclerae anicteric, conjunctivae [name].  Pupils are equal, round and  reactive to light.  ENT:  Oropharynx was clear.  Moist oral mucosa.  NECK:  Normal carotid upstrokes without bruits.  Jugular venous pressure is  normal.  There is no cervical lymphadenopathy.  LUNGS:  Clear to auscultation bilaterally.  CARDIOVASCULAR:  The apex is discrete, non-displaced.  Heart is regular rate  and rhythm without murmurs or gallops.  ABDOMEN:  Soft, non-tender.  No abdominal bruits.  Bowel sounds are present.  EXTREMITIES:  There is no clubbing or cyanosis.  There is trace bilateral  pretibial edema.  Distal pulses are 2+ and equal throughout.  There are no  femoral arterial bruits.  Motor strength is 5/5 and equal bilaterally.   EKG performed in the office demonstrates sinus bradycardia with findings  consistent with left ventricular hypertrophy.  ST-T wave changes are  present, likely secondary to LVH but cannot exclude anterior lateral  ischemia.   ASSESSMENT:  [name] is a 60 year old male  with atypical chest pain.  I  think with his past history of similar pains and the fact that his chest  discomfort is unrelated to exertion, that this is likely non-cardiac.  However, the patient clearly has an abnormal electrocardiogram.  I reviewed  his past electrocardiograms and these findings have been present in past  years dating back to 1999.  My impression is that his electrocardiogram  changes are secondary to left ventricular hypertrophy, although it is  somewhat unusual to see significant T-wave changes even in the anterior  leads with left ventricular hypertrophy.  This can be seen in people with  extensive ventricular hypertrophy and hypertensive heart disease.  I think  since [name] pain is not increasing, and he is currently stable that  we can perform a stress test as an outpatient.  I would like to do an  exercise Myoview to evaluate him for ischemic heart disease.  If he has  abnormal findings, I would have a low threshold to perform a cardiac  catheterization.  I will follow up with him following his stress test  regarding future plans.   Regarding his hypertension, he appears to have very good control on his  current medical regimen.  If his stress test is normal, I would be inclined  to check a surface echocardiogram to evaluate his left ventricular  hypertrophy to evaluate his left ventricular hypertrophy.   He will continue on a daily aspirin and his current medicines for the  present time and I again will followup with him after his stress test is  completed.   Dr. Debby Bud, thank you again for the opportunity to evaluate [name].  Please feel free to call me at any time with questions regarding his care.    Sincerely,      Veverly Fells. Excell Seltzer, MD   MDC/MedQ  DD:  02/16/2006  DT:  02/17/2006  Job #:  [id number]

## 2011-03-19 LAB — URINE CULTURE
Colony Count: NO GROWTH
Culture: NO GROWTH

## 2011-03-19 LAB — DIFFERENTIAL
Basophils Absolute: 0.1
Basophils Relative: 1
Eosinophils Absolute: 0.1
Eosinophils Relative: 1
Lymphocytes Relative: 47 — ABNORMAL HIGH
Lymphs Abs: 3.7 — ABNORMAL HIGH
Monocytes Absolute: 0.7
Monocytes Relative: 8
Neutro Abs: 3.3
Neutrophils Relative %: 43

## 2011-03-19 LAB — URINALYSIS, ROUTINE W REFLEX MICROSCOPIC
Bilirubin Urine: NEGATIVE
Glucose, UA: 100 — AB
Ketones, ur: NEGATIVE
Leukocytes, UA: NEGATIVE
Nitrite: NEGATIVE
Protein, ur: >300 — AB
Specific Gravity, Urine: 1.031 — ABNORMAL HIGH
Urobilinogen, UA: 0.2
pH: 7

## 2011-03-19 LAB — CBC
HCT: 42.2
Hemoglobin: 14.3
MCHC: 33.9
MCV: 87.1
Platelets: 331
RBC: 4.84
RDW: 14.1 — ABNORMAL HIGH
WBC: 7.8

## 2011-03-19 LAB — PROTIME-INR
INR: 1.1
Prothrombin Time: 14.9

## 2011-03-19 LAB — URINE MICROSCOPIC-ADD ON

## 2011-11-06 DIAGNOSIS — R972 Elevated prostate specific antigen [PSA]: Secondary | ICD-10-CM

## 2011-11-06 HISTORY — DX: Elevated prostate specific antigen (PSA): R97.20

## 2011-11-22 ENCOUNTER — Inpatient Hospital Stay (HOSPITAL_COMMUNITY)
Admission: EM | Admit: 2011-11-22 | Discharge: 2011-11-26 | DRG: 683 | Disposition: A | Discharge status: Home / Self Care | Payer: Medicaid Other | Attending: Internal Medicine | Admitting: Internal Medicine

## 2011-11-22 ENCOUNTER — Inpatient Hospital Stay (HOSPITAL_COMMUNITY): Payer: Medicaid Other

## 2011-11-22 ENCOUNTER — Encounter (HOSPITAL_COMMUNITY): Payer: Self-pay | Admitting: Emergency Medicine

## 2011-11-22 ENCOUNTER — Emergency Department (HOSPITAL_COMMUNITY): Payer: Medicaid Other

## 2011-11-22 DIAGNOSIS — I10 Essential (primary) hypertension: Secondary | ICD-10-CM

## 2011-11-22 DIAGNOSIS — Z87891 Personal history of nicotine dependence: Secondary | ICD-10-CM

## 2011-11-22 DIAGNOSIS — N179 Acute kidney failure, unspecified: Principal | ICD-10-CM | POA: Diagnosis present

## 2011-11-22 DIAGNOSIS — N138 Other obstructive and reflux uropathy: Secondary | ICD-10-CM | POA: Diagnosis present

## 2011-11-22 DIAGNOSIS — N1330 Unspecified hydronephrosis: Secondary | ICD-10-CM | POA: Diagnosis present

## 2011-11-22 DIAGNOSIS — N401 Enlarged prostate with lower urinary tract symptoms: Secondary | ICD-10-CM | POA: Diagnosis present

## 2011-11-22 DIAGNOSIS — I129 Hypertensive chronic kidney disease with stage 1 through stage 4 chronic kidney disease, or unspecified chronic kidney disease: Secondary | ICD-10-CM | POA: Diagnosis present

## 2011-11-22 DIAGNOSIS — E876 Hypokalemia: Secondary | ICD-10-CM | POA: Diagnosis present

## 2011-11-22 DIAGNOSIS — Z9089 Acquired absence of other organs: Secondary | ICD-10-CM | POA: Diagnosis not present

## 2011-11-22 DIAGNOSIS — N320 Bladder-neck obstruction: Secondary | ICD-10-CM | POA: Diagnosis present

## 2011-11-22 DIAGNOSIS — Z9119 Patient's noncompliance with other medical treatment and regimen: Secondary | ICD-10-CM | POA: Diagnosis not present

## 2011-11-22 DIAGNOSIS — D638 Anemia in other chronic diseases classified elsewhere: Secondary | ICD-10-CM | POA: Diagnosis not present

## 2011-11-22 DIAGNOSIS — D649 Anemia, unspecified: Secondary | ICD-10-CM | POA: Diagnosis present

## 2011-11-22 DIAGNOSIS — Z91199 Patient's noncompliance with other medical treatment and regimen due to unspecified reason: Secondary | ICD-10-CM

## 2011-11-22 DIAGNOSIS — N1832 Chronic kidney disease, stage 3b: Secondary | ICD-10-CM | POA: Diagnosis present

## 2011-11-22 DIAGNOSIS — N183 Chronic kidney disease, stage 3 unspecified: Secondary | ICD-10-CM | POA: Diagnosis present

## 2011-11-22 DIAGNOSIS — R319 Hematuria, unspecified: Secondary | ICD-10-CM | POA: Diagnosis present

## 2011-11-22 HISTORY — DX: Essential (primary) hypertension: I10

## 2011-11-22 HISTORY — DX: Dysuria: R30.0

## 2011-11-22 HISTORY — DX: Anemia, unspecified: D64.9

## 2011-11-22 LAB — POCT I-STAT, CHEM 8
BUN: 74 mg/dL — ABNORMAL HIGH (ref 6–23)
Chloride: 102 mEq/L (ref 96–112)
Glucose, Bld: 83 mg/dL (ref 70–99)
HCT: 31 % — ABNORMAL LOW (ref 39.0–52.0)
Hemoglobin: 10.5 g/dL — ABNORMAL LOW (ref 13.0–17.0)
Potassium: 3.6 mEq/L (ref 3.5–5.1)
Sodium: 143 mEq/L (ref 135–145)

## 2011-11-22 LAB — CBC
HCT: 27.7 % — ABNORMAL LOW (ref 39.0–52.0)
Hemoglobin: 9.2 g/dL — ABNORMAL LOW (ref 13.0–17.0)
MCH: 29.6 pg (ref 26.0–34.0)
MCHC: 33.2 g/dL (ref 30.0–36.0)
MCV: 89.1 fL (ref 78.0–100.0)
Platelets: 273 10*3/uL (ref 150–400)
RBC: 3.11 MIL/uL — ABNORMAL LOW (ref 4.22–5.81)
RDW: 13.4 % (ref 11.5–15.5)

## 2011-11-22 LAB — PRO B NATRIURETIC PEPTIDE: Pro B Natriuretic peptide (BNP): 6574 pg/mL — ABNORMAL HIGH (ref 0–125)

## 2011-11-22 LAB — URINALYSIS, MICROSCOPIC ONLY
Bilirubin Urine: NEGATIVE
Glucose, UA: NEGATIVE mg/dL
Hgb urine dipstick: NEGATIVE
Ketones, ur: NEGATIVE mg/dL
Protein, ur: NEGATIVE mg/dL
pH: 6.5 (ref 5.0–8.0)

## 2011-11-22 LAB — LIPID PANEL
LDL Cholesterol: 103 mg/dL — ABNORMAL HIGH (ref 0–99)
Total CHOL/HDL Ratio: 2.6 RATIO
VLDL: 12 mg/dL (ref 0–40)

## 2011-11-22 LAB — RETICULOCYTES
RBC.: 3.13 MIL/uL — ABNORMAL LOW (ref 4.22–5.81)
Retic Count, Absolute: 25 10*3/uL (ref 19.0–186.0)
Retic Ct Pct: 0.8 % (ref 0.4–3.1)

## 2011-11-22 LAB — TROPONIN I: Troponin I: <0.3 ng/mL (ref <0.30)

## 2011-11-22 LAB — CARDIAC PANEL(CRET KIN+CKTOT+MB+TROPI)
CK, MB: 1.7 ng/mL (ref 0.3–4.0)
Relative Index: 1.3 (ref 0.0–2.5)
Total CK: 135 U/L (ref 7–232)
Troponin I: <0.3 ng/mL (ref <0.30)

## 2011-11-22 LAB — CK TOTAL AND CKMB (NOT AT ARMC): Total CK: 139 U/L (ref 7–232)

## 2011-11-22 MED ORDER — SODIUM CHLORIDE 0.9 % IV BOLUS (SEPSIS)
1000.0000 mL | Freq: Once | INTRAVENOUS | Status: AC
  Administered 2011-11-22: 1000 mL via INTRAVENOUS

## 2011-11-22 MED ORDER — SODIUM CHLORIDE 0.9 % IJ SOLN
3.0000 mL | Freq: Two times a day (BID) | INTRAMUSCULAR | Status: DC
  Administered 2011-11-22 – 2011-11-26 (×3): 3 mL via INTRAVENOUS

## 2011-11-22 MED ORDER — HYDROCODONE-ACETAMINOPHEN 5-325 MG PO TABS
1.0000 | ORAL_TABLET | ORAL | Status: DC | PRN
  Administered 2011-11-22: 1 via ORAL
  Administered 2011-11-22 – 2011-11-25 (×7): 2 via ORAL
  Filled 2011-11-22 (×6): qty 2
  Filled 2011-11-22: qty 1
  Filled 2011-11-22: qty 2

## 2011-11-22 MED ORDER — HYDRALAZINE HCL 25 MG PO TABS
25.0000 mg | ORAL_TABLET | Freq: Three times a day (TID) | ORAL | Status: DC
  Administered 2011-11-22 – 2011-11-26 (×13): 25 mg via ORAL
  Filled 2011-11-22 (×17): qty 1

## 2011-11-22 MED ORDER — LABETALOL HCL 5 MG/ML IV SOLN
10.0000 mg | Freq: Once | INTRAVENOUS | Status: AC
  Administered 2011-11-22: 10 mg via INTRAVENOUS
  Filled 2011-11-22: qty 4

## 2011-11-22 MED ORDER — LABETALOL HCL 5 MG/ML IV SOLN
10.0000 mg | INTRAVENOUS | Status: DC | PRN
  Administered 2011-11-22: 10 mg via INTRAVENOUS
  Filled 2011-11-22: qty 4

## 2011-11-22 MED ORDER — OXYCODONE-ACETAMINOPHEN 5-325 MG PO TABS
1.0000 | ORAL_TABLET | Freq: Once | ORAL | Status: AC
  Administered 2011-11-22: 1 via ORAL
  Filled 2011-11-22: qty 1

## 2011-11-22 MED ORDER — HYDRALAZINE HCL 20 MG/ML IJ SOLN
10.0000 mg | Freq: Three times a day (TID) | INTRAMUSCULAR | Status: DC | PRN
  Administered 2011-11-22 – 2011-11-23 (×3): 10 mg via INTRAVENOUS
  Filled 2011-11-22: qty 0.5
  Filled 2011-11-22 (×2): qty 1
  Filled 2011-11-22: qty 0.5
  Filled 2011-11-22: qty 1

## 2011-11-22 MED ORDER — ONDANSETRON HCL 4 MG PO TABS: 4.0000 mg | ORAL_TABLET | Freq: Four times a day (QID) | ORAL | Status: DC | PRN

## 2011-11-22 MED ORDER — SODIUM CHLORIDE 0.9 % IV SOLN
INTRAVENOUS | Status: DC
  Administered 2011-11-22 (×2): via INTRAVENOUS

## 2011-11-22 MED ORDER — TAMSULOSIN HCL 0.4 MG PO CAPS
0.4000 mg | ORAL_CAPSULE | Freq: Every day | ORAL | Status: DC
  Administered 2011-11-22 – 2011-11-26 (×5): 0.4 mg via ORAL
  Filled 2011-11-22 (×5): qty 1

## 2011-11-22 MED ORDER — LABETALOL HCL 200 MG PO TABS
200.0000 mg | ORAL_TABLET | Freq: Two times a day (BID) | ORAL | Status: DC
  Administered 2011-11-22 – 2011-11-26 (×8): 200 mg via ORAL
  Filled 2011-11-22 (×10): qty 1

## 2011-11-22 MED ORDER — SIMETHICONE 80 MG PO CHEW
80.0000 mg | CHEWABLE_TABLET | Freq: Four times a day (QID) | ORAL | Status: DC | PRN
  Filled 2011-11-22 (×2): qty 1

## 2011-11-22 MED ORDER — FINASTERIDE 5 MG PO TABS
5.0000 mg | ORAL_TABLET | Freq: Every day | ORAL | Status: DC
  Administered 2011-11-22 – 2011-11-26 (×5): 5 mg via ORAL
  Filled 2011-11-22 (×5): qty 1

## 2011-11-22 MED ORDER — ONDANSETRON HCL 4 MG/2ML IJ SOLN
4.0000 mg | Freq: Four times a day (QID) | INTRAMUSCULAR | Status: DC | PRN
  Administered 2011-11-23: 4 mg via INTRAVENOUS
  Filled 2011-11-22: qty 2

## 2011-11-22 NOTE — ED Notes (Signed)
Pt's urine color in foley has changed from clear yellow to a clear yellow/orange/blood tinged color

## 2011-11-22 NOTE — ED Notes (Signed)
Pt presenting to ed with c/o bilateral leg swelling and pain pt states onset x 2 years. Pt with multiple complaints in triage. Pt states urinary frequency and dysuria. Pt states abdominal pain due to hernia. Pt states he also has back pain. Pt states he has been out of the country and just returned from [place] x 2 weeks ago. Pt also states he has a rash on the right side of his abdomen. Pt states he also needs blood pressure medications.

## 2011-11-22 NOTE — ED Notes (Signed)
Report given to RN on 4E, awaiting doctor to call back about pt's BP still being high, then will transfer.

## 2011-11-22 NOTE — ED Notes (Signed)
Patient steady passing urine through foley catheter. Color change, orange, and cloudy  with presence of blood clots. No malodor noted. RN notified

## 2011-11-22 NOTE — ED Notes (Signed)
Pt states he comes in with lots of problems, states his feet are painful and swelling, tingling in bil legs, headache, high blood pressure, states just started HCTZ yesterday for the high blood pressure.

## 2011-11-22 NOTE — H&P (Signed)
[facility] History and Physical  [name] ZOX:[id number] DOB: 06/24/1949 DOA: 11/22/2011  Referring physician: Dr. Patria Mane from ED PCP: Illene Regulus, MD   Chief Complaint: Progressive weakness  HPI:  Pt is 61 yo male with history of hypertension, who has left the country over one year ago and is now back and reports not taking blood pressure medications while gone, now presents to [facility] with main concern of progressive weakness that initially started several weeks prior to admission and associated with urinary retention and dysuria when able to void. Pt denies chest pain, shortness of breath, no specific abdominal or other urinary concerns. He denies any specific neurologic focal weakness, no changes in vision, no headaches.  Review of Systems:   Constitutional: Negative for fever, chills and malaise/fatigue. Negative for diaphoresis.  HENT: Negative for hearing loss, ear pain, nosebleeds, congestion, sore throat, neck pain, tinnitus and ear discharge.   Eyes: Negative for blurred vision, double vision, photophobia, pain, discharge and redness.  Respiratory: Negative for cough, hemoptysis, sputum production, shortness of breath, wheezing and stridor.   Cardiovascular: Negative for chest pain, palpitations, orthopnea, claudication and leg swelling.  Gastrointestinal: Negative for nausea, vomiting and abdominal pain. Negative for heartburn, constipation, blood in stool and melena.  Genitourinary: Per HPI Musculoskeletal: Negative for myalgias, back pain, joint pain and falls.  Skin: Negative for itching and rash.  Neurological: Negative for dizziness and weakness. Negative for tingling, tremors, sensory change, speech change, focal weakness, loss of consciousness and headaches.  Endo/Heme/Allergies: Negative for environmental allergies and polydipsia. Does not bruise/bleed easily.  Psychiatric/Behavioral: Negative for suicidal ideas. The patient is not nervous/anxious.        Past Medical History  Diagnosis Date  . Hypertension   . Anemia   . Dysuria    Past Surgical History  Procedure Date  . Spleenectomy    Social History:  reports that he has quit smoking. His smoking use included Cigarettes. He has never used smokeless tobacco. He reports that he does not drink alcohol or use illicit drugs.  No Known Allergies  History reviewed. No pertinent family history.  Prior to Admission medications   Medication Sig Start Date End Date Taking? Authorizing Provider  ciprofloxacin (CIPRO) 500 MG tablet Take 500 mg by mouth 2 (two) times daily.   Yes Historical Provider, MD  doxazosin (CARDURA) 4 MG tablet Take 4 mg by mouth daily.   Yes Historical Provider, MD  hydrochlorothiazide (HYDRODIURIL) 25 MG tablet Take 25 mg by mouth every morning.   Yes Historical Provider, MD  ibuprofen (ADVIL,MOTRIN) 800 MG tablet Take 800 mg by mouth every 8 (eight) hours as needed. Pain   Yes Historical Provider, MD   Physical Exam: Filed Vitals:   11/22/11 1032 11/22/11 1119  BP: 191/121 182/125  Pulse: 70 72  Temp: 98 F (36.7 C)   TempSrc: Oral   Resp: 20 18  SpO2: 100% 100%     General:  Sitting in bed and not in acute distress  Eyes: EOMI, PERRLA  ENT: no OP erythema  Neck: supple, no thyroid enlargement  Cardiovascular: Regular rate and rhythm, S1 and S2 present, no murmurs  Respiratory: Clear to auscultation bilaterally, no wheezing  Abdomen: Distended bladder, non tender, bowel sounds present  Skin: Good turgor, no rash  Musculoskeletal: Full range of motion in all extremities and no joint effusions noted  Psychiatric: Normal affect, good judgement demonstrated  Neurologic: Grossly non focal  Labs on Admission:  Basic Metabolic Panel:  Lab 11/22/11  1116  NA 143  K 3.6  CL 102  CO2 --  GLUCOSE 83  BUN 74*  CREATININE 4.80*  CALCIUM --  MG --  PHOS --   CBC:  Lab 11/22/11 1325 11/22/11 1116  WBC 5.8 --  NEUTROABS -- --  HGB 9.2*  10.5*  HCT 27.7* 31.0*  MCV 89.1 --  PLT 273 --   Cardiac Enzymes:  Lab 11/22/11 1325  CKTOTAL --  CKMB --  CKMBINDEX --  TROPONINI <0.30    Radiological Exams on Admission: Dg Chest 2 View 11/22/2011   IMPRESSION:  Cardiomegaly.  No active disease.  Elevation of the right hemidiaphragm.    EKG: pending  Assessment/Plan  Hypertensive urgency, secondary to accelerated HTN, uncontrolled - will admit the pt to telemetry floor for further evaluation and management - this is most likely secondary to medical noncompliance - will initiate BP medication regimen and provide supportive care with IVF, analgesia as needed for adequate pain control  Acute on Chronic renal failure - likely secondary to urinary retention, will ask to place Foley as bladder significantly distended - will obtain urine sodium and urine creatinine - will also check renal US and will follow strict I's and O's - will order IVF and will obtain BMP in AM - may need urology consult  Anemia - likely of chronic disease - will check anemia panel and FOBT - CBC in AM  Code Status: Full Family Communication: Pt at bedside Disposition Plan: Home when medically stable  Debbora Presto, MD  [facility] Pager [phone]  If 7PM-7AM, please contact night-coverage [email or web address] Password [email or web address] 11/22/2011, 3:00 PM

## 2011-11-22 NOTE — ED Notes (Addendum)
Patient aware of need for urine specimen. Patient unable to void at this time. Patient given urinal. Encouraged to call for assistance if needed.   

## 2011-11-22 NOTE — ED Notes (Signed)
US at bedside

## 2011-11-22 NOTE — ED Provider Notes (Signed)
History     CSN: [id number]  Arrival date & time 11/22/11  1016   First MD Initiated Contact with Patient 11/22/11 1039      Chief Complaint  Patient presents with  . Leg Swelling     HPI Patient reports worsening swelling of his lower legs over the past several weeks.  He does report some lower [place] swelling for several years.  He also has had some recent urinary frequency and dysuria for which she was started on ciprofloxacin recently.  He denies chest pain or shortness of breath.  He has recently returned from [place].  He states he is currently out of his blood pressure medications.  Denies dyspnea on exertion and orthopnea.  No paroxysmal nocturnal dyspnea.  He does use ibuprofen as needed for pain.  Denies nausea vomiting or diarrhea.  He has no flank pain.  His symptoms are mild.  He has urinated today.  Denies weakness of his upper lower stiffness.  He has no headache.    Past Medical History  Diagnosis Date  . Hypertension   . Anemia   . Dysuria     Past Surgical History  Procedure Date  . Spleenectomy     No family history on file.  History  Substance Use Topics  . Smoking status: Former Games developer  . Smokeless tobacco: Not on file  . Alcohol Use: No      Review of Systems  All other systems reviewed and are negative.    Allergies  Review of patient's allergies indicates no known allergies.  Home Medications   Current Outpatient Rx  Name Route Sig Dispense Refill  . CIPROFLOXACIN HCL 500 MG PO TABS Oral Take 500 mg by mouth 2 (two) times daily.    Marland Kitchen DOXAZOSIN MESYLATE 4 MG PO TABS Oral Take 4 mg by mouth daily.    Marland Kitchen HYDROCHLOROTHIAZIDE 25 MG PO TABS Oral Take 25 mg by mouth every morning.    . IBUPROFEN 800 MG PO TABS Oral Take 800 mg by mouth every 8 (eight) hours as needed. Pain      BP 182/125  Pulse 72  Temp 98 F (36.7 C) (Oral)  Resp 18  SpO2 100%  Physical Exam  Nursing note and vitals  reviewed. Constitutional: He is oriented to person, place, and time. He appears well-developed and well-nourished.  HENT:  Head: Normocephalic and atraumatic.  Eyes: EOM are normal.  Neck: Normal range of motion.  Cardiovascular: Normal rate, regular rhythm, normal heart sounds and intact distal pulses.   Pulmonary/Chest: Effort normal and breath sounds normal. No respiratory distress.  Abdominal: Soft. He exhibits no distension. There is no tenderness.  Musculoskeletal: He exhibits edema.       2+ edema in his bilateral lower extremities.  Normal pulses in his bilateral feet.  Normal motor and sensory function in his lower extremities  Neurological: He is alert and oriented to person, place, and time.  Skin: Skin is warm and dry.  Psychiatric: He has a normal mood and affect. Judgment normal.    ED Course  Procedures (including critical care time)   Date: 11/22/2011  Rate: 65  Rhythm: normal sinus rhythm  QRS Axis: normal  Intervals: normal  ST/T Wave abnormalities: nonspecific ST and T wave changes  Conduction Disutrbances: none  Narrative Interpretation:   Old EKG Reviewed: No significant changes noted     Labs Reviewed  POCT I-STAT, CHEM 8 - Abnormal; Notable for the following:  BUN 74 (*)     Creatinine, Ser 4.80 (*)     Hemoglobin 10.5 (*)     HCT 31.0 (*)     All other components within normal limits  CBC - Abnormal; Notable for the following:    RBC 3.11 (*)     Hemoglobin 9.2 (*)     HCT 27.7 (*)     All other components within normal limits  PHOSPHORUS - Abnormal; Notable for the following:    Phosphorus 4.8 (*)     All other components within normal limits  PRO B NATRIURETIC PEPTIDE - Abnormal; Notable for the following:    Pro B Natriuretic peptide (BNP) 6574.0 (*)     All other components within normal limits  RETICULOCYTES - Abnormal; Notable for the following:    RBC. 3.13 (*)     All other components within normal limits  TROPONIN I  URINALYSIS,  WITH MICROSCOPIC  MAGNESIUM  CK TOTAL AND CKMB  TSH  CARDIAC PANEL(CRET KIN+CKTOT+MB+TROPI)  HEMOGLOBIN A1C  LIPID PANEL  VITAMIN B12  FOLATE  IRON AND TIBC  FERRITIN  SODIUM, URINE, RANDOM  CREATININE, URINE, RANDOM  BASIC METABOLIC PANEL  CBC      1. Acute renal failure       MDM   Patient with evidence of hypertensive urgency and new acute renal failure.  His blood pressure was 191/121.  2 mg IV labetalol given.  Patient will be admitted for additional workup.  This is likely worsening chronic renal insufficiency.  However you're goes BUN creatinine is normal.  I suspect this has not worsened over the past none week to 2 weeks.  The patient will be admitted overnight for additional workup       Lyanne Co, MD 11/22/11 [phone]

## 2011-11-23 DIAGNOSIS — I10 Essential (primary) hypertension: Secondary | ICD-10-CM

## 2011-11-23 DIAGNOSIS — N179 Acute kidney failure, unspecified: Secondary | ICD-10-CM

## 2011-11-23 LAB — BASIC METABOLIC PANEL
BUN: 62 mg/dL — ABNORMAL HIGH (ref 6–23)
CO2: 29 mEq/L (ref 19–32)
Calcium: 9 mg/dL (ref 8.4–10.5)
GFR calc non Af Amer: 13 mL/min — ABNORMAL LOW (ref >90)
Glucose, Bld: 92 mg/dL (ref 70–99)

## 2011-11-23 LAB — CBC
Hemoglobin: 9.9 g/dL — ABNORMAL LOW (ref 13.0–17.0)
MCH: 28.9 pg (ref 26.0–34.0)
MCHC: 32.5 g/dL (ref 30.0–36.0)
MCV: 88.9 fL (ref 78.0–100.0)
Platelets: 259 10*3/uL (ref 150–400)

## 2011-11-23 LAB — FOLATE: Folate: 8.5 ng/mL

## 2011-11-23 LAB — CARDIAC PANEL(CRET KIN+CKTOT+MB+TROPI)
Relative Index: 1.3 (ref 0.0–2.5)
Total CK: 115 U/L (ref 7–232)

## 2011-11-23 LAB — VITAMIN B12: Vitamin B-12: 372 pg/mL (ref 211–911)

## 2011-11-23 LAB — TSH: TSH: 2.08 u[IU]/mL (ref 0.350–4.500)

## 2011-11-23 LAB — OCCULT BLOOD X 1 CARD TO LAB, STOOL: Fecal Occult Bld: NEGATIVE

## 2011-11-23 LAB — GLUCOSE, CAPILLARY: Glucose-Capillary: 98 mg/dL (ref 70–99)

## 2011-11-23 LAB — CREATININE, URINE, RANDOM: Creatinine, Urine: 52.7 mg/dL

## 2011-11-23 MED ORDER — POTASSIUM CHLORIDE CRYS ER 20 MEQ PO TBCR
40.0000 meq | EXTENDED_RELEASE_TABLET | Freq: Two times a day (BID) | ORAL | Status: AC
  Administered 2011-11-23 (×2): 40 meq via ORAL
  Filled 2011-11-23 (×2): qty 2

## 2011-11-23 MED ORDER — SODIUM CHLORIDE 0.9 % IV SOLN
INTRAVENOUS | Status: DC
  Administered 2011-11-23 – 2011-11-24 (×5): via INTRAVENOUS
  Administered 2011-11-25: 150 mL/h via INTRAVENOUS
  Administered 2011-11-25 – 2011-11-26 (×3): via INTRAVENOUS

## 2011-11-23 NOTE — Progress Notes (Signed)
Patient ID: [name], male   DOB: 06/24/1949, 61 y.o.   MRN: [id number]  [facility] PROGRESS NOTE  [name]:[id number] DOB: 06/24/1949 DOA: 11/22/2011 PCP: Illene Regulus, MD, please note that pt has not seen PCP in > 4 years and is willing to try to get re established if possible.   Brief narrative: Pt is 61 yo male with history of hypertension, who has left the country over one year ago and is now back and reports not taking blood pressure medications while gone, now presents to [facility] with main concern of progressive weakness that initially started several weeks prior to admission and associated with urinary retention and dysuria when able to void. Pt denies chest pain, shortness of breath, no specific abdominal or other urinary concerns. He denies any specific neurologic focal weakness, no changes in vision, no headaches.  Consultants:  Phone consultation with urologist on call. Please see details of recommendations below   Procedures:  none  Antibiotics:  none  HPI/Subjective: Pt reports feeling better today, denies any chest pain, shortness of breath, abdominal pain.   Objective: Filed Vitals:   11/23/11 0913 11/23/11 1209 11/23/11 1400 11/23/11 1427  BP: 150/83 148/86 145/80 148/85  Pulse: 83  70 74  Temp:    97.8 F (36.6 C)  TempSrc:    Oral  Resp:    18  Height:      Weight:      SpO2:    96%    Intake/Output Summary (Last 24 hours) at 11/23/11 2023 Last data filed at 11/23/11 1802  Gross per 24 hour  Intake    963 ml  Output   9710 ml  Net  -8747 ml    Exam:   General:  Pt is alert, follows commands appropriately, not in acute distress  Cardiovascular: Regular rate and rhythm, S1/S2, no murmurs, no rubs, no gallops  Respiratory: Clear to auscultation bilaterally, no wheezing, no crackles, no rhonchi  Abdomen: Soft, non tender, slightly distended, bowel sounds present, no guarding  Extremities: No edema, pulses DP and PT  palpable bilaterally  Neuro: Grossly nonfocal  Data Reviewed: Basic Metabolic Panel:  Lab 11/23/11 9562 11/22/11 1116  NA 142 143  K 3.0* 3.6  CL 101 102  CO2 29 --  GLUCOSE 92 83  BUN 62* 74*  CREATININE 4.46* 4.80*  CALCIUM 9.0 --  MG 2.3 --  PHOS 4.8 --   CBC:  Lab 11/23/11 0510 11/22/11 1325 11/22/11 1116  WBC 5.7 5.8 --  NEUTROABS -- -- --  HGB 9.9* 9.2* 10.5*  HCT 30.5* 27.7* 31.0*  MCV 88.9 89.1 --  PLT 259 273 --   Cardiac Enzymes:  Lab 11/23/11 0640 11/22/11 2109 11/22/11 1325  CKTOTAL 115 135 139  CKMB 1.5 1.7 1.8  CKMBINDEX -- -- --  TROPONINI <0.30 <0.30 <0.30   CBG:  Lab 11/23/11 0717  GLUCAP 98    Studies:  Dg Chest 2 View 11/22/2011    IMPRESSION:  1.  Cardiomegaly.   2.  No active disease.   3.  Elevation of the right hemidiaphragm.   US Renal 11/22/2011  IMPRESSION:   1.  Bilateral moderate hydronephrosis and hydroureter.  A cyst is noted in the lower pole of the right kidney measures 4.3 cm.  2.  A distended urinary bladder is noted.  There is a polypoid filling defect within the urinary bladder measures at least 6.6 x 5 x 4.6 cm.   3.  This may be  due to a bladder mass or polypoid enlarged prostate gland with indentation of urinary bladder base.   4.  Prevoid bladder measures 2136 ml.  The patient was not able to void. Correlation with urology exam and cystoscopy is recommended.    Scheduled Meds:   . finasteride  5 mg Oral Daily  . hydrALAZINE  25 mg Oral Q8H  . labetalol  200 mg Oral BID  . potassium chloride  40 mEq Oral BID  . Tamsulosin HCl  0.4 mg Oral Daily   Continuous Infusions:   . sodium chloride 100 mL/hr at 11/23/11 1244     Assessment/Plan:  Hypertensive urgency, secondary to accelerated HTN, uncontrolled  - this is most likely secondary to medical noncompliance  - pt was started on Labetalol and Hydralazine - he has responded well to the therapy and BP now improving - will increase the dose of Labetalol  before adding new agent in order to simplify the regimen and to increase the likelihood of compliance  Acute on Chronic renal failure  - likely secondary to urinary retention, creatinine is trending down, will continue IVF - urology consult over the phone, recommendation was to keep foley in place for 3-4 weeks with no need for emergent work up at this time - Dr. Margarita Grizzle recommended follow up in outpatient setting in ~ 4 weeks and to make sure that foley stays in upon discharge - recommendation was also to initiate therapy with Finasteride and Tamsulosin as was the pt's medical regimen in the past - medical compliance was discussed with pt and encouraged - pt seems understanding and reports noncompliance was mainly due to financial difficulty  Anemia  - likely of chronic disease  - will check anemia panel and FOBT  - CBC in AM  Hypokalemia - Mg level is within normal limits - will supplement as indicated - BMP in AM  Code Status: Full Family Communication: Pt at bedside Disposition Plan: Home when medically stable  Debbora Presto, MD  [facility] Pager [phone]  If 7PM-7AM, please contact night-coverage [email or web address] Password [email or web address] 11/23/2011, 8:23 PM   LOS: 1 day

## 2011-11-23 NOTE — Progress Notes (Signed)
UR complete 

## 2011-11-24 ENCOUNTER — Inpatient Hospital Stay (HOSPITAL_COMMUNITY): Payer: Medicaid Other

## 2011-11-24 DIAGNOSIS — N1330 Unspecified hydronephrosis: Secondary | ICD-10-CM | POA: Diagnosis present

## 2011-11-24 DIAGNOSIS — D649 Anemia, unspecified: Secondary | ICD-10-CM | POA: Diagnosis present

## 2011-11-24 DIAGNOSIS — N179 Acute kidney failure, unspecified: Secondary | ICD-10-CM

## 2011-11-24 DIAGNOSIS — R319 Hematuria, unspecified: Secondary | ICD-10-CM | POA: Diagnosis present

## 2011-11-24 DIAGNOSIS — N1832 Chronic kidney disease, stage 3b: Secondary | ICD-10-CM | POA: Diagnosis present

## 2011-11-24 DIAGNOSIS — R112 Nausea with vomiting, unspecified: Secondary | ICD-10-CM

## 2011-11-24 DIAGNOSIS — I10 Essential (primary) hypertension: Secondary | ICD-10-CM

## 2011-11-24 LAB — CBC
HCT: 29.6 % — ABNORMAL LOW (ref 39.0–52.0)
MCV: 89.4 fL (ref 78.0–100.0)
RBC: 3.31 MIL/uL — ABNORMAL LOW (ref 4.22–5.81)
RDW: 13.7 % (ref 11.5–15.5)
WBC: 7.9 10*3/uL (ref 4.0–10.5)

## 2011-11-24 LAB — BASIC METABOLIC PANEL
BUN: 63 mg/dL — ABNORMAL HIGH (ref 6–23)
CO2: 27 mEq/L (ref 19–32)
Chloride: 101 mEq/L (ref 96–112)
Creatinine, Ser: 4.72 mg/dL — ABNORMAL HIGH (ref 0.50–1.35)
GFR calc Af Amer: 14 mL/min — ABNORMAL LOW (ref >90)
Potassium: 3.6 mEq/L (ref 3.5–5.1)

## 2011-11-24 LAB — PSA: PSA: 105.7 ng/mL — ABNORMAL HIGH (ref <=4.00)

## 2011-11-24 MED ORDER — DOCUSATE SODIUM 100 MG PO CAPS
200.0000 mg | ORAL_CAPSULE | Freq: Every day | ORAL | Status: DC
  Administered 2011-11-25 – 2011-11-26 (×3): 200 mg via ORAL
  Filled 2011-11-24 (×3): qty 2

## 2011-11-24 MED ORDER — AMLODIPINE BESYLATE 5 MG PO TABS
5.0000 mg | ORAL_TABLET | Freq: Every day | ORAL | Status: DC
  Administered 2011-11-24 – 2011-11-26 (×3): 5 mg via ORAL
  Filled 2011-11-24 (×3): qty 1

## 2011-11-24 NOTE — Progress Notes (Signed)
[name] is a pleasant  61 y.o. male admitted with generalized weakness/abdonminal and back pain, as well as hematuria. I have seen and examined him at bed side and reviewed his chart. Patient found to have ARF thought to be due urinary retention with suggestion of bladder mass and hydronephrosis. GU consulted over the phone per [name]. [name] feels somewhat better, but still has some back pain. I have discussed with [name], who will graciously see patient later today.    1. Acute renal failure     Past Medical History  Diagnosis Date  . Hypertension   . Anemia   . Dysuria    Current Facility-Administered Medications  Medication Dose Route Frequency Provider Last Rate Last Dose  . 0.9 %  sodium chloride infusion   Intravenous Continuous Donald Vavrek, MD 100 mL/hr at 11/24/11 1306    . amLODipine (NORVASC) tablet 5 mg  5 mg Oral Daily Donald Howdyshell, MD      . finasteride (PROSCAR) tablet 5 mg  5 mg Oral Daily Donald Ogle, MD   5 mg at 11/24/11 0916  . hydrALAZINE (APRESOLINE) injection 10 mg  10 mg Intravenous Q8H PRN Donald Ogle, MD   10 mg at 11/23/11 2312  . hydrALAZINE (APRESOLINE) tablet 25 mg  25 mg Oral Q8H Donald Murray, MD   25 mg at 11/24/11 1305  . HYDROcodone-acetaminophen (NORCO) 5-325 MG per tablet 1-2 tablet  1-2 tablet Oral Q4H PRN Donald Ogle, MD   2 tablet at 11/24/11 0916  . labetalol (NORMODYNE) tablet 200 mg  200 mg Oral BID Donald Murray, MD   200 mg at 11/24/11 0916  . labetalol (NORMODYNE,TRANDATE) injection 10 mg  10 mg Intravenous Q2H PRN Donald Murray, MD   10 mg at 11/22/11 1700  . ondansetron (ZOFRAN) tablet 4 mg  4 mg Oral Q6H PRN Donald Ogle, MD       Or  . ondansetron [facility]) injection 4 mg  4 mg Intravenous Q6H PRN Donald Ogle, MD   4 mg at 11/23/11 1156  . potassium chloride SA (K-DUR,KLOR-CON) CR tablet 40 mEq  40 mEq Oral BID Donald Ogle, MD   40 mEq at 11/23/11 2104  . simethicone (MYLICON) chewable tablet 80 mg  80 mg Oral QID  PRN Donald Murray, MD      . sodium chloride 0.9 % injection 3 mL  3 mL Intravenous Q12H Donald Ogle, MD   3 mL at 11/23/11 0912  . Tamsulosin HCl (FLOMAX) capsule 0.4 mg  0.4 mg Oral Daily Donald Ogle, MD   0.4 mg at 11/24/11 0916   No Known Allergies Active Problems:  AKI (acute kidney injury)  Hematuria  Hydronephrosis  HTN (hypertension), benign  Anemia   Vital signs in last 24 hours: Temp:  [97.6 F (36.4 C)-99.4 F (37.4 C)] 97.6 F (36.4 C) (06/19 0505) Pulse Rate:  [70-97] 76  (06/19 0916) Resp:  [18] 18  (06/19 0505) BP: (142-190)/(75-98) 168/96 mmHg (06/19 0915) SpO2:  [96 %-98 %] 98 % (06/19 0505) Weight change:  Last BM Date: 11/22/11  Intake/Output from previous day: 06/18 0701 - 06/19 0700 In: 2101 [P.O.:960; I.V.:1141] Out: 2710 [Urine:2260; Emesis/NG output:450] Intake/Output this shift: Total I/O In: 240 [P.O.:240] Out: 1575 [Urine:1575]  Lab Results:  [name] 11/24/11 0433 11/23/11 0510  WBC 7.9 5.7  HGB 9.7* 9.9*  HCT 29.6* 30.5*  PLT 271 259   BMET  Basename  11/24/11 0433 11/23/11 0510  NA 140 142  K 3.6 3.0*  CL 101 101  CO2 27 29  GLUCOSE 90 92  BUN 63* 62*  CREATININE 4.72* 4.46*  CALCIUM 8.7 9.0    Studies/Results: US Renal  11/22/2011  *RADIOLOGY REPORT*  Clinical Data: Acute renal failure  RENAL/URINARY TRACT ULTRASOUND  Technique: Renal ultrasound  Comparison:  CT scan 03/03/2007  Findings: Right kidney measures 14 cm in length.  There is moderate right hydronephrosis.  No diagnostic renal calculus.  A cyst in the lower pole measures 4.3 by 3.5 cm.  Left kidney measures 13.8 cm.  There is moderate left hydronephrosis.  Bilateral hydroureter is noted.  A distended urinary bladder is noted.  There is a polypoid filling defect within the urinary bladder measures at least 6.6 x 5 x 4.6 cm.  This may be due to a bladder mass or polypoid enlarged prostate gland with indentation of urinary bladder base.  Prevoid bladder measures 2136  ml.  The patient was not able to void. Correlation with urology exam and cystoscopy is recommended.  IMPRESSION:  1.  Bilateral moderate hydronephrosis and hydroureter.  A cyst is noted in the lower pole of the right kidney measures 4.3 cm. 2.A distended urinary bladder is noted.  There is a polypoid filling defect within the urinary bladder measures at least 6.6 x 5 x 4.6 cm.  This may be due to a bladder mass or polypoid enlarged prostate gland with indentation of urinary bladder base.  Prevoid bladder measures 2136 ml.  The patient was not able to void. Correlation with urology exam and cystoscopy is recommended.  This was made a call report.  Original Report Authenticated By: [name], M.D.    Medications: I have reviewed the patient's current medications.   Physical exam GENERAL- alert HEAD- normal atraumatic, no neck masses, normal thyroid, no jvd RESPIRATORY- appears well, vitals normal, no respiratory distress, acyanotic, normal RR, ear and throat exam is normal, neck free of mass or lymphadenopathy, chest clear, no wheezing, crepitations, rhonchi, normal symmetric air entry CVS- regular rate and rhythm, S1, S2 normal, no murmur, click, rub or gallop ABDOMEN- abdomen is soft without significant tenderness, masses, organomegaly or guarding NEURO- Grossly normal EXTREMITIES- extremities normal, atraumatic, no cyanosis or edema. Foley in place.  Plan  *  AKI (acute kidney injury)/ Hematuria/ Hydronephrosis- some slight bump in creatinine. Need better understanding of bladder pathology. Obtain ct abdomen/pelvis without iv contrast, check psa. Continue ivf/foley, and plans per neurology recommendations.. [name] will graciously see later.  *  HTN (hypertension), benign- uncontrolled. Add norvasc. *  Anemia- /blood loss anemia, from hematuria. Anemia panel. Monitor. *  dvt prophylaxis.    [name] 11/24/2011 1:58 PM Pager: [phone].

## 2011-11-24 NOTE — Consult Note (Signed)
[name] 11/24/2011 [name] D Requesting Physician:  Dr. Venetia Constable  Reason for Consult:  REnal failure with bilat hydro HPI: The patient is a 61 y.o. year-old with hx of HTN, CKD with creat 1.3 in 2008 and 2011, admitted 6/17 with gen weakness over several weeks, associated with urinary retention, difficulty voiding. BP was high on admission, controlled now. US showed mod severe bilat hydronephrosis and hydroureter with distended bladder and large mass vs protruding prostate in bladder.  Foley placed, but creat not improving, still in 4-5 range. Patient w/o complaints. Describes long hx of difficulty urinated. Required catheterization 5 yrs ago x 1. Since then always has to strain to empty bladder, and has to push on his lower abdomen as well.  Denies hx of renal failure or dialysis.   Creatinine, Ser  Date/Time Value Range Status  11/24/2011  4:33 AM 4.72* 0.50 - 1.35 mg/dL Final  1/61/0960  4:54 AM 4.46* 0.50 - 1.35 mg/dL Final  0/98/1191 47:82 AM 4.80* 0.50 - 1.35 mg/dL Final  9/56/2130 86:57 AM 1.25  0.40-1.50 mg/dL Final  8/46/9629  5:28 AM 1.3  0.4-1.5 mg/dL Final    Past Medical History:  Past Medical History  Diagnosis Date  . Hypertension   . Anemia   . Dysuria     Past Surgical History:  Past Surgical History  Procedure Date  . Spleenectomy     Family History: History reviewed. No pertinent family history. Social History:  reports that he has quit smoking. His smoking use included Cigarettes. He has never used smokeless tobacco. He reports that he does not drink alcohol or use illicit drugs.  Allergies: No Known Allergies  Home medications: Prior to Admission medications   Medication Sig Start Date End Date Taking? Authorizing Provider  ciprofloxacin (CIPRO) 500 MG tablet Take 500 mg by mouth 2 (two) times daily.   Yes Historical Provider, MD  doxazosin (CARDURA) 4 MG tablet Take 4 mg by mouth daily.   Yes Historical Provider, MD  hydrochlorothiazide  (HYDRODIURIL) 25 MG tablet Take 25 mg by mouth every morning.   Yes Historical Provider, MD  ibuprofen (ADVIL,MOTRIN) 800 MG tablet Take 800 mg by mouth every 8 (eight) hours as needed. Pain   Yes Historical Provider, MD    Inpatient medications:    . amLODipine  5 mg Oral Daily  . finasteride  5 mg Oral Daily  . hydrALAZINE  25 mg Oral Q8H  . labetalol  200 mg Oral BID  . potassium chloride  40 mEq Oral BID  . sodium chloride  3 mL Intravenous Q12H  . Tamsulosin HCl  0.4 mg Oral Daily    Review of Systems Gen:  Denies headache, fever, chills, sweats.  No weight loss. HEENT:  No visual change, sore throat, difficulty swallowing. Resp:  No difficulty breathing, DOE.  No cough or hemoptysis. Cardiac:  No chest pain, orthopnea, PND.  Denies edema. GI:   Denies abdominal pain.   No nausea, vomiting, diarrhea.  No constipation. GU:  Denies difficulty or change in voiding.  No change in urine color.     MS:  Denies joint pain or swelling.   Derm:  Denies skin rash or itching.  No chronic skin conditions.  Neuro:   Denies focal weakness, memory problems, hx stroke or TIA.   Psych:  Denies symptoms of depression of anxiety.  No hallucination.    Labs: Basic Metabolic Panel:  Lab 11/24/11 4132 11/23/11 0510 11/22/11 1510 11/22/11 1116  NA 140 142 --  143  K 3.6 3.0* -- 3.6  CL 101 101 -- 102  CO2 27 29 -- --  GLUCOSE 90 92 -- 83  BUN 63* 62* -- 74*  CREATININE 4.72* 4.46* -- 4.80*  ALB -- -- -- --  CALCIUM 8.7 9.0 -- --  PHOS -- -- 4.8* --   Liver Function Tests: No results found for this basename: AST:3,ALT:3,ALKPHOS:3,BILITOT:3,PROT:3,ALBUMIN:3 in the last 168 hours No results found for this basename: LIPASE:3,AMYLASE:3 in the last 168 hours No results found for this basename: AMMONIA:3 in the last 168 hours CBC:  Lab 11/24/11 0433 11/23/11 0510 11/22/11 1325 11/22/11 1116  WBC 7.9 5.7 5.8 --  NEUTROABS -- -- -- --  HGB 9.7* 9.9* 9.2* 10.5*  HCT 29.6* 30.5* 27.7* 31.0*    MCV 89.4 88.9 89.1 --  PLT 271 259 273 --   PT/INR: @labrcntip (inr:5) Cardiac Enzymes:  Lab 11/23/11 0640 11/22/11 2109 11/22/11 1325  CKTOTAL 115 135 139  CKMB 1.5 1.7 1.8  CKMBINDEX -- -- --  TROPONINI <0.30 <0.30 <0.30   CBG:  Lab 11/24/11 0728 11/23/11 0717  GLUCAP 75 98    Iron Studies:  Lab 11/22/11 1510  IRON 57  TIBC 276  TRANSFERRIN --  FERRITIN 490*    Xrays/Other Studies: No results found.  Physical Exam:  Blood pressure 131/76, pulse 69, temperature 98.3 F (36.8 C), temperature source Oral, resp. rate 16, height 6\' 2"  (1.88 m), weight 90.1 kg (198 lb 10.2 oz), SpO2 98.00%.  Gen: alert, WD WN no distress Skin: no rash, cyanosis HEENT:  EOMI, sclera anicteric, throat clear Neck: no JVD, no bruits or LAN Chest: clear bilat Heart: regular, no rub or gallop Abdomen: soft, nontender, large midline scar from splenectomy GU: foley in place Ext: no edema Neuro: alert, Ox3, no focal deficit Heme/Lymph: no bruising or LAN  2008- SCr 1.3  GFR 60 2011-  SCr 1.25 11/22/11- SCr 4.80 > 4.46 > 4.72 UA- no prot, 0-2 rbc/wbc UNa- 60 [place]- R 14cm, L 13.8cm, bilat moderate hydroneph and hydroureter, distended bladder 2136 mL with 5x6 cm mass    CXR- NAD  Impression 1. Renal failure - due to obstruction obviously.  Not sure how much of this is acute vs subacute vs permanent damage.  He has been having symptoms of outflow obstruction for over 5 years, suggesting a benign (BPH) process. UA is bening. 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 Volume is euvolemic- agree with IVF's and Foley cath which is in place.  Will follow. 2. 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 PSA is pending. Needs urology evaluation. 3. Hx of HTN - avoid ACEI and ARB. Getting CCB, labetalol and hydralazine here.  4. 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    Donald Moselle  MD [facility] [phone] pgr    [phone] cell 11/24/2011, 4:55 PM

## 2011-11-24 NOTE — Progress Notes (Signed)
Patient has slept well during the night.  He is concerned about the blood in his urine.  He is still having some pain in his abdomen/back, but states that it is better than it was last night.  BP was slightly elevated during the night.  Patient was given PRN hydralazine and it was therapeutic.  Call light is with him in bed, will continue to monitor.

## 2011-11-25 DIAGNOSIS — N1330 Unspecified hydronephrosis: Secondary | ICD-10-CM

## 2011-11-25 DIAGNOSIS — I10 Essential (primary) hypertension: Secondary | ICD-10-CM

## 2011-11-25 DIAGNOSIS — N179 Acute kidney failure, unspecified: Secondary | ICD-10-CM

## 2011-11-25 DIAGNOSIS — R112 Nausea with vomiting, unspecified: Secondary | ICD-10-CM

## 2011-11-25 LAB — CBC
HCT: 30.3 % — ABNORMAL LOW (ref 39.0–52.0)
MCV: 89.4 fL (ref 78.0–100.0)
Platelets: 253 10*3/uL (ref 150–400)
RBC: 3.39 MIL/uL — ABNORMAL LOW (ref 4.22–5.81)
RDW: 13.6 % (ref 11.5–15.5)
WBC: 6.6 10*3/uL (ref 4.0–10.5)

## 2011-11-25 LAB — COMPREHENSIVE METABOLIC PANEL
AST: 14 U/L (ref 0–37)
Albumin: 2.9 g/dL — ABNORMAL LOW (ref 3.5–5.2)
Alkaline Phosphatase: 73 U/L (ref 39–117)
CO2: 27 mEq/L (ref 19–32)
Chloride: 102 mEq/L (ref 96–112)
GFR calc non Af Amer: 15 mL/min — ABNORMAL LOW (ref >90)
Potassium: 3.4 mEq/L — ABNORMAL LOW (ref 3.5–5.1)
Total Bilirubin: 0.3 mg/dL (ref 0.3–1.2)

## 2011-11-25 LAB — VITAMIN B12: Vitamin B-12: 590 pg/mL (ref 211–911)

## 2011-11-25 LAB — GLUCOSE, CAPILLARY

## 2011-11-25 MED ORDER — TAMSULOSIN HCL 0.4 MG PO CAPS
0.8000 mg | ORAL_CAPSULE | Freq: Every day | ORAL | Status: DC
  Administered 2011-11-25 – 2011-11-26 (×2): 0.8 mg via ORAL
  Filled 2011-11-25 (×3): qty 2

## 2011-11-25 MED ORDER — GABAPENTIN 100 MG PO CAPS
100.0000 mg | ORAL_CAPSULE | Freq: Two times a day (BID) | ORAL | Status: DC
  Administered 2011-11-25 – 2011-11-26 (×2): 100 mg via ORAL
  Filled 2011-11-25 (×3): qty 1

## 2011-11-25 NOTE — Consult Note (Signed)
Urology Consult  Referring physician: Dr Conley Canal Reason for referral: Bilateral hydronephrosis, enlarged prostate  Chief Complaint: frequency, straining, decreased stream.  History of Present Illness: 61 years old male with several weeks history of progressive weakness.  He has also been complaining of frequency, hesitancy, decreased stream, straining on urination, suprapubic pain for the past 4-5 years. He has been on Doxazosin.  He was evaluated several years ago by Dr Retta Diones and Wanda Plump for hematuria.  His creatinine on admission was 4.80.  It has come down today to 3.8.Renal ultrasound showed moderate bilateral hydronephrosis.  CT scan revealed moderate hydronephrosis, a markedly enlarged prostate. He has an indwelling Foley that is now draining clear urine.   Past Medical History  Diagnosis Date  . Hypertension   . Anemia   . Dysuria    Past Surgical History  Procedure Date  . Spleenectomy     Medications: Cipro, Doxazosin, HCTZ, Ibuprofen Allergies: No Known Allergies  History reviewed. No pertinent family history. Social History:  reports that he has quit smoking. His smoking use included Cigarettes. He has never used smokeless tobacco. He reports that he does not drink alcohol or use illicit drugs.  ROS: All systems are reviewed and negative except as noted.   Physical Exam:  Vital signs in last 24 hours: Temp:  [97.9 F (36.6 C)-98.4 F (36.9 C)] 97.9 F (36.6 C) (06/20 1320) Pulse Rate:  [65-70] 70  (06/20 1320) Resp:  [16-20] 16  (06/20 1320) BP: (135-157)/(80-83) 135/80 mmHg (06/20 1320) SpO2:  [95 %-99 %] 97 % (06/20 1320) Weight:  [197 lb 3.2 oz (89.449 kg)] 197 lb 3.2 oz (89.449 kg) (06/20 0536) ENT: Within normal limits. Neck: Supple, no cervical lymph node, no thyromegaly. Cardiovascular: Skin warm; not flushed Respiratory: Breaths quiet; no shortness of breath Abdomen: No masses Neurological: Normal sensation to touch Musculoskeletal: Normal  motor function arms and legs Lymphatics: No inguinal adenopathy Skin: No rashes Genitourinary:Penis: circumcised.  Testicles are within normal limits. Rectal: Prostate enlarged 80 gm; no nodules, seminal vesicles not palpable.  Laboratory Data:  Results for orders placed during the hospital encounter of 11/22/11 (from the past 72 hour(s))  CARDIAC PANEL(CRET KIN+CKTOT+MB+TROPI)     Status: Normal   Collection Time   11/22/11  9:09 PM      Component Value Range Comment   Total CK 135  7 - 232 U/L    CK, MB 1.7  0.3 - 4.0 ng/mL    Troponin I <0.30  <0.30 ng/mL    Relative Index 1.3  0.0 - 2.5   BASIC METABOLIC PANEL     Status: Abnormal   Collection Time   11/23/11  5:10 AM      Component Value Range Comment   Sodium 142  135 - 145 mEq/L    Potassium 3.0 (*) 3.5 - 5.1 mEq/L    Chloride 101  96 - 112 mEq/L    CO2 29  19 - 32 mEq/L    Glucose, Bld 92  70 - 99 mg/dL    BUN 62 (*) 6 - 23 mg/dL    Creatinine, Ser 1.61 (*) 0.50 - 1.35 mg/dL    Calcium 9.0  8.4 - 09.6 mg/dL    GFR calc non Af Amer 13 (*) >90 mL/min    GFR calc Af Amer 15 (*) >90 mL/min   CBC     Status: Abnormal   Collection Time   11/23/11  5:10 AM      Component Value Range  Comment   WBC 5.7  4.0 - 10.5 K/uL    RBC 3.43 (*) 4.22 - 5.81 MIL/uL    Hemoglobin 9.9 (*) 13.0 - 17.0 g/dL    HCT 46.9 (*) 62.9 - 52.0 %    MCV 88.9  78.0 - 100.0 fL    MCH 28.9  26.0 - 34.0 pg    MCHC 32.5  30.0 - 36.0 g/dL    RDW 52.8  41.3 - 24.4 %    Platelets 259  150 - 400 K/uL   CARDIAC PANEL(CRET KIN+CKTOT+MB+TROPI)     Status: Normal   Collection Time   11/23/11  6:40 AM      Component Value Range Comment   Total CK 115  7 - 232 U/L    CK, MB 1.5  0.3 - 4.0 ng/mL    Troponin I <0.30  <0.30 ng/mL    Relative Index 1.3  0.0 - 2.5   GLUCOSE, CAPILLARY     Status: Normal   Collection Time   11/23/11  7:17 AM      Component Value Range Comment   Glucose-Capillary 98  70 - 99 mg/dL   OCCULT BLOOD X 1 CARD TO LAB, STOOL     Status:  Normal   Collection Time   11/23/11 10:12 PM      Component Value Range Comment   Fecal Occult Bld NEGATIVE     CBC     Status: Abnormal   Collection Time   11/24/11  4:33 AM      Component Value Range Comment   WBC 7.9  4.0 - 10.5 K/uL    RBC 3.31 (*) 4.22 - 5.81 MIL/uL    Hemoglobin 9.7 (*) 13.0 - 17.0 g/dL    HCT 01.0 (*) 27.2 - 52.0 %    MCV 89.4  78.0 - 100.0 fL    MCH 29.3  26.0 - 34.0 pg    MCHC 32.8  30.0 - 36.0 g/dL    RDW 53.6  64.4 - 03.4 %    Platelets 271  150 - 400 K/uL   BASIC METABOLIC PANEL     Status: Abnormal   Collection Time   11/24/11  4:33 AM      Component Value Range Comment   Sodium 140  135 - 145 mEq/L    Potassium 3.6  3.5 - 5.1 mEq/L    Chloride 101  96 - 112 mEq/L    CO2 27  19 - 32 mEq/L    Glucose, Bld 90  70 - 99 mg/dL    BUN 63 (*) 6 - 23 mg/dL    Creatinine, Ser 7.42 (*) 0.50 - 1.35 mg/dL    Calcium 8.7  8.4 - 59.5 mg/dL    GFR calc non Af Amer 12 (*) >90 mL/min    GFR calc Af Amer 14 (*) >90 mL/min   GLUCOSE, CAPILLARY     Status: Normal   Collection Time   11/24/11  7:28 AM      Component Value Range Comment   Glucose-Capillary 75  70 - 99 mg/dL   PSA     Status: Abnormal   Collection Time   11/24/11  2:35 PM      Component Value Range Comment   PSA 105.70 (*) <=4.00 ng/mL   RETICULOCYTES     Status: Abnormal   Collection Time   11/24/11  3:00 PM      Component Value Range Comment   Retic Ct Pct 0.7  0.4 - 3.1 %    RBC. 3.34 (*) 4.22 - 5.81 MIL/uL    Retic Count, Manual 23.4  19.0 - 186.0 K/uL   CBC     Status: Abnormal   Collection Time   11/25/11  4:38 AM      Component Value Range Comment   WBC 6.6  4.0 - 10.5 K/uL    RBC 3.39 (*) 4.22 - 5.81 MIL/uL    Hemoglobin 9.8 (*) 13.0 - 17.0 g/dL    HCT 16.1 (*) 09.6 - 52.0 %    MCV 89.4  78.0 - 100.0 fL    MCH 28.9  26.0 - 34.0 pg    MCHC 32.3  30.0 - 36.0 g/dL    RDW 04.5  40.9 - 81.1 %    Platelets 253  150 - 400 K/uL   COMPREHENSIVE METABOLIC PANEL     Status: Abnormal    Collection Time   11/25/11  4:38 AM      Component Value Range Comment   Sodium 140  135 - 145 mEq/L    Potassium 3.4 (*) 3.5 - 5.1 mEq/L    Chloride 102  96 - 112 mEq/L    CO2 27  19 - 32 mEq/L    Glucose, Bld 84  70 - 99 mg/dL    BUN 56 (*) 6 - 23 mg/dL    Creatinine, Ser 9.14 (*) 0.50 - 1.35 mg/dL    Calcium 8.6  8.4 - 78.2 mg/dL    Total Protein 6.8  6.0 - 8.3 g/dL    Albumin 2.9 (*) 3.5 - 5.2 g/dL    AST 14  0 - 37 U/L    ALT 9  0 - 53 U/L    Alkaline Phosphatase 73  39 - 117 U/L    Total Bilirubin 0.3  0.3 - 1.2 mg/dL    GFR calc non Af Amer 15 (*) >90 mL/min    GFR calc Af Amer 18 (*) >90 mL/min   PROTIME-INR     Status: Abnormal   Collection Time   11/25/11  4:38 AM      Component Value Range Comment   Prothrombin Time 15.4 (*) 11.6 - 15.2 seconds    INR 1.19  0.00 - 1.49   GLUCOSE, CAPILLARY     Status: Normal   Collection Time   11/25/11  8:00 AM      Component Value Range Comment   Glucose-Capillary 80  70 - 99 mg/dL    Comment 1 Notify RN      Comment 2 Documented in Chart      No results found for this or any previous visit (from the past 240 hour(s)). Creatinine:  Basename 11/25/11 0438 11/24/11 0433 11/23/11 0510 11/22/11 1116  CREATININE 3.89* 4.72* 4.46* 4.80*    Xrays: Renal ultrasound showed moderate bilateral hydronephrosis and a distended bladder.  CT scan without contrast revealed bilateral hydronephrosis, markedly enlarged prostate  with Foley in the bladder.  Impression/Assessment:  Bilateral hydronephrosis.  BPH.  Urinary retention.  Plan:  Leave Foley indwelling.  Start Tamsulosin 0.8 mgm daily.  Repeat renal ultrasound to reassess hydronephrosis. Can go home with Foley when medically stable.  Will follow as outpatient.  Heidi Maclin-HENRY 11/25/2011, 7:22 PM

## 2011-11-25 NOTE — Progress Notes (Signed)
SUBJECTIVE feels better. Complaints of some abdominal pain and burning feet.  1. Acute renal failure     Past Medical History  Diagnosis Date  . Hypertension   . Anemia   . Dysuria    Current Facility-Administered Medications  Medication Dose Route Frequency Provider Last Rate Last Dose  . 0.9 %  sodium chloride infusion   Intravenous Continuous Lavren Lewan, MD 150 mL/hr at 11/25/11 1637 150 mL/hr at 11/25/11 1637  . amLODipine (NORVASC) tablet 5 mg  5 mg Oral Daily Jatia Musa, MD   5 mg at 11/25/11 0953  . docusate sodium (COLACE) capsule 200 mg  200 mg Oral Daily Caroline More, NP   200 mg at 11/25/11 0949  . finasteride (PROSCAR) tablet 5 mg  5 mg Oral Daily Dorothea Ogle, MD   5 mg at 11/25/11 4098  . gabapentin (NEURONTIN) capsule 100 mg  100 mg Oral BID Idris Edmundson, MD      . hydrALAZINE (APRESOLINE) injection 10 mg  10 mg Intravenous Q8H PRN Dorothea Ogle, MD   10 mg at 11/23/11 2312  . hydrALAZINE (APRESOLINE) tablet 25 mg  25 mg Oral Q8H Alison Murray, MD   25 mg at 11/25/11 1450  . HYDROcodone-acetaminophen (NORCO) 5-325 MG per tablet 1-2 tablet  1-2 tablet Oral Q4H PRN Dorothea Ogle, MD   2 tablet at 11/25/11 0330  . labetalol (NORMODYNE) tablet 200 mg  200 mg Oral BID Alison Murray, MD   200 mg at 11/25/11 1191  . labetalol (NORMODYNE,TRANDATE) injection 10 mg  10 mg Intravenous Q2H PRN Alison Murray, MD   10 mg at 11/22/11 1700  . ondansetron (ZOFRAN) tablet 4 mg  4 mg Oral Q6H PRN Dorothea Ogle, MD       Or  . ondansetron [facility]) injection 4 mg  4 mg Intravenous Q6H PRN Dorothea Ogle, MD   4 mg at 11/23/11 1156  . simethicone (MYLICON) chewable tablet 80 mg  80 mg Oral QID PRN Alison Murray, MD      . sodium chloride 0.9 % injection 3 mL  3 mL Intravenous Q12H Dorothea Ogle, MD   3 mL at 11/23/11 0912  . Tamsulosin HCl (FLOMAX) capsule 0.4 mg  0.4 mg Oral Daily Dorothea Ogle, MD   0.4 mg at 11/25/11 4782   No Known Allergies Active Problems:  AKI (acute kidney  injury)  Hematuria  Hydronephrosis  HTN (hypertension), benign  Anemia   Vital signs in last 24 hours: Temp:  [97.9 F (36.6 C)-98.4 F (36.9 C)] 97.9 F (36.6 C) (06/20 1320) Pulse Rate:  [65-70] 70  (06/20 1320) Resp:  [16-20] 16  (06/20 1320) BP: (135-157)/(80-83) 135/80 mmHg (06/20 1320) SpO2:  [95 %-99 %] 97 % (06/20 1320) Weight:  [89.449 kg (197 lb 3.2 oz)] 89.449 kg (197 lb 3.2 oz) (06/20 0536) Weight change:  Last BM Date: 11/23/11  Intake/Output from previous day: 06/19 0701 - 06/20 0700 In: 1924 [P.O.:240; I.V.:1684] Out: 5525 [Urine:5525] Intake/Output this shift: Total I/O In: 2040 [P.O.:840; I.V.:1200] Out: 1775 [Urine:1775]  Lab Results:  [facility] 11/25/11 0438 11/24/11 0433  WBC 6.6 7.9  HGB 9.8* 9.7*  HCT 30.3* 29.6*  PLT 253 271   BMET  Basename 11/25/11 0438 11/24/11 0433  NA 140 140  K 3.4* 3.6  CL 102 101  CO2 27 27  GLUCOSE 84 90  BUN 56* 63*  CREATININE 3.89* 4.72*  CALCIUM 8.6 8.7  Studies/Results: Ct Abdomen Pelvis Wo Contrast  11/24/2011  *RADIOLOGY REPORT*  Clinical Data: Worsening swelling at the legs over the past several weeks.  Recent urinary frequency and dysuria, treated with antibiotics currently.  Bladder or prostate mass and bladder distention on a recent ultrasound.  CT ABDOMEN AND PELVIS WITHOUT CONTRAST  Technique:  Multidetector CT imaging of the abdomen and pelvis was performed following the standard protocol without intravenous contrast.  Comparison: Ultrasound dated 11/22/2011 and CT dated 03/03/2007.  Findings: Moderate bilateral hydronephrosis is again demonstrated. This was not present on the previous CT.  No visible dilatation of the ureters.  Visualization of the ureters is limited by the lack of intravenous and oral contrast. Previously noted lower pole right renal cyst.  Inhomogeneous soft tissue density filling the urinary bladder with a small amount of air in the bladder as well as a Foley catheter. The prostate  is markedly enlarged with an interval increase in size.  This appears to be protruding into the urinary bladder, producing much of the soft tissue density.  Post splenectomy changes with associated surgical clips are again demonstrated with stable splenules in the left upper quadrant.  Normal appearing appendix.  No gastrointestinal abnormalities or visualized enlarged lymph nodes.  Assessment of the lymph nodes is difficult due to the lack of intravenous and oral contrast and lack of intra-abdominal and pelvic fat.  Minimal bilateral pleural fluid.  Minimal bilateral lower lobe atelectasis.  Three low density mass-like areas in the dome of the liver on the right.  These measure higher density than expected for cysts.  One of these measures 5.4 x 3.3 cm on image #12.  Another measures 2.9 x 2.0 cm on image #7 and the third measures 2.4 x 1.4 cm on image #9.  The majority of this portion of the liver was not included on the previous CT.  The included portion demonstrated a 2.9 x 2.2 cm mass-like area at the location of the currently demonstrated 5.4 x 3.3 cm area.  Grossly unremarkable noncontrasted appearance of the pancreas. Mild bilateral adrenal hyperplasia.  Small supraumbilical ventral hernia containing herniated peritoneal fluid.  Tortuous distal thoracic descending aorta.  Lumbar and lower thoracic spine degenerative changes.  IMPRESSION:  1.  Markedly enlarged prostate gland protruding into the urinary bladder.  This is concerning for the possibility of a prostate neoplasm.  A bladder mass is less likely based on the current noncontrasted appearance. 2.  Moderate bilateral hydronephrosis. 3.  Nonspecific liver masses. 4.  Minimal bilateral pleural fluid. 5.  Minimal bilateral lower lobe atelectasis. 6.  Small supraumbilical ventral hernia containing peritoneal fluid.  Original Report Authenticated By: Darrol Angel, M.D.    Medications: I have reviewed the patient's current medications.   Physical  exam GENERAL- alert HEAD- normal atraumatic, no neck masses, normal thyroid, no jvd RESPIRATORY- appears well, vitals normal, no respiratory distress, acyanotic, normal RR, ear and throat exam is normal, neck free of mass or lymphadenopathy, chest clear, no wheezing, crepitations, rhonchi, normal symmetric air entry CVS- regular rate and rhythm, S1, S2 normal, no murmur, click, rub or gallop ABDOMEN-some tenderness to deep palpation suprapubic area. NEURO- Grossly normal EXTREMITIES- extremities normal, atraumatic, no cyanosis or edema. Urine clear.  Plan   * AKI (acute kidney injury)/ Hematuria/ Hydronephrosis/Prostatomegaly- PSA 105. Some improvement in bun/cr. Appreciate renal input. Agree with urology consult. Dr Brunilda Payor will graciously see later. Follow renal recommendations. * HTN (hypertension), benign- uncontrolled.better controlled.* Anemia- blood loss anemia, from hematuria. Hb stable. *  Burning feet- says ongoing last 5 years. / peripheral neuropathy versus other causes. Trial of neurontin. Check vitamin b12 level. * dvt prophylaxis.     Myleen Brailsford 11/25/2011 4:56 PM Pager: [phone].

## 2011-11-25 NOTE — Progress Notes (Signed)
Subjective: Feeling good, no complaints, good UOP and creat is down today.   Objective Vital signs in last 24 hours: Filed Vitals:   11/24/11 0916 11/24/11 1418 11/24/11 2122 11/25/11 0536  BP:  131/76 136/83 157/83  Pulse: 76 69 68 65  Temp:  98.3 F (36.8 C) 98.2 F (36.8 C) 98.4 F (36.9 C)  TempSrc:  Oral Oral Oral  Resp:  16 20 18   Height:      Weight:    89.449 kg (197 lb 3.2 oz)  SpO2:  98% 99% 95%   Weight change:   Intake/Output Summary (Last 24 hours) at 11/25/11 1243 Last data filed at 11/25/11 1001  Gross per 24 hour  Intake   2044 ml  Output   5175 ml  Net  -3131 ml   Labs: Basic Metabolic Panel:  Lab 11/25/11 1308 11/24/11 0433 11/23/11 0510 11/22/11 1510 11/22/11 1116  NA 140 140 142 -- 143  K 3.4* 3.6 3.0* -- 3.6  CL 102 101 101 -- 102  CO2 27 27 29  -- --  GLUCOSE 84 90 92 -- 83  BUN 56* 63* 62* -- 74*  CREATININE 3.89* 4.72* 4.46* -- 4.80*  ALB -- -- -- -- --  CALCIUM 8.6 8.7 9.0 -- --  PHOS -- -- -- 4.8* --   Liver Function Tests:  Lab 11/25/11 0438  AST 14  ALT 9  ALKPHOS 73  BILITOT 0.3  PROT 6.8  ALBUMIN 2.9*   No results found for this basename: LIPASE:3,AMYLASE:3 in the last 168 hours No results found for this basename: AMMONIA:3 in the last 168 hours CBC:  Lab 11/25/11 0438 11/24/11 0433 11/23/11 0510 11/22/11 1325  WBC 6.6 7.9 5.7 5.8  NEUTROABS -- -- -- --  HGB 9.8* 9.7* 9.9* 9.2*  HCT 30.3* 29.6* 30.5* 27.7*  MCV 89.4 89.4 88.9 89.1  PLT 253 271 259 273   PT/INR: @labrcntip (inr:5) Cardiac Enzymes:  Lab 11/23/11 0640 11/22/11 2109 11/22/11 1325  CKTOTAL 115 135 139  CKMB 1.5 1.7 1.8  CKMBINDEX -- -- --  TROPONINI <0.30 <0.30 <0.30   CBG:  Lab 11/25/11 0800 11/24/11 0728 11/23/11 0717  GLUCAP 80 75 98    Iron Studies:  Lab 11/22/11 1510  IRON 57  TIBC 276  TRANSFERRIN --  FERRITIN 490*    Physical Exam:  Blood pressure 157/83, pulse 65, temperature 98.4 F (36.9 C), temperature source Oral, resp. rate  18, height 6\' 2"  (1.88 m), weight 89.449 kg (197 lb 3.2 oz), SpO2 95.00%.  Gen: alert, WD WN no distress   Neck: no JVD, no bruits or LAN  Chest: clear bilat  Heart: regular, no rub or gallop  Abdomen: soft, nontender, large midline scar from splenectomy  GU: foley in place  Ext: no edema  Neuro: alert, Ox3, no focal deficit  Heme/Lymph: no bruising or LAN   2008- SCr 1.3 GFR 60  2011- SCr 1.25  11/22/11- SCr 4.80 > 4.46 > 4.72  UA- no prot, 0-2 rbc/wbc  UNa- 60  [place]- R 14cm, L 13.8cm, bilat moderate hydroneph and hydroureter, distended bladder 2136 mL with 5x6 cm mass  CXR- NAD   Impression  1. Renal failure - due to obstruction obviously. He has early stage III CKD at minimum from 2008 and 2011 creatinines. Renal function seems to be improving, creat down and good UOP.  Would continue IVF's for now.  Check creat daily.  2. 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 PSA is pending. Needs urology evaluation. 3. Hx of HTN - avoid ACEI and ARB. Getting CCB, labetalol and hydralazine here.  4. 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

## 2011-11-26 ENCOUNTER — Inpatient Hospital Stay (HOSPITAL_COMMUNITY): Payer: Medicaid Other

## 2011-11-26 DIAGNOSIS — N1330 Unspecified hydronephrosis: Secondary | ICD-10-CM

## 2011-11-26 DIAGNOSIS — R112 Nausea with vomiting, unspecified: Secondary | ICD-10-CM

## 2011-11-26 DIAGNOSIS — N179 Acute kidney failure, unspecified: Secondary | ICD-10-CM

## 2011-11-26 DIAGNOSIS — I10 Essential (primary) hypertension: Secondary | ICD-10-CM

## 2011-11-26 LAB — BASIC METABOLIC PANEL
CO2: 28 mEq/L (ref 19–32)
Chloride: 102 mEq/L (ref 96–112)
GFR calc non Af Amer: 18 mL/min — ABNORMAL LOW (ref >90)
Glucose, Bld: 81 mg/dL (ref 70–99)
Potassium: 3.2 mEq/L — ABNORMAL LOW (ref 3.5–5.1)
Sodium: 140 mEq/L (ref 135–145)

## 2011-11-26 LAB — CBC
HCT: 29.4 % — ABNORMAL LOW (ref 39.0–52.0)
Hemoglobin: 9.7 g/dL — ABNORMAL LOW (ref 13.0–17.0)
RBC: 3.27 MIL/uL — ABNORMAL LOW (ref 4.22–5.81)
WBC: 6.1 10*3/uL (ref 4.0–10.5)

## 2011-11-26 MED ORDER — GABAPENTIN 100 MG PO CAPS: 100.0000 mg | ORAL_CAPSULE | Freq: Two times a day (BID) | ORAL | Status: DC

## 2011-11-26 MED ORDER — HYDRALAZINE HCL 25 MG PO TABS: 25.0000 mg | ORAL_TABLET | Freq: Three times a day (TID) | ORAL | Status: DC

## 2011-11-26 MED ORDER — AMLODIPINE BESYLATE 5 MG PO TABS: 5.0000 mg | ORAL_TABLET | Freq: Every day | ORAL | Status: DC

## 2011-11-26 MED ORDER — TAMSULOSIN HCL 0.4 MG PO CAPS: 0.8000 mg | ORAL_CAPSULE | Freq: Every day | ORAL | Status: DC

## 2011-11-26 MED ORDER — LABETALOL HCL 200 MG PO TABS: 200.0000 mg | ORAL_TABLET | Freq: Two times a day (BID) | ORAL | Status: DC

## 2011-11-26 MED ORDER — HYDROCODONE-ACETAMINOPHEN 5-325 MG PO TABS: 1.0000 | ORAL_TABLET | ORAL | Status: AC | PRN

## 2011-11-26 MED ORDER — FINASTERIDE 5 MG PO TABS: 5.0000 mg | ORAL_TABLET | Freq: Every day | ORAL | Status: DC

## 2011-11-26 NOTE — Progress Notes (Signed)
Subjective: Feeling good, no complaints, good UOP and creat is down today.   Objective Vital signs in last 24 hours: Filed Vitals:   11/25/11 1320 11/25/11 2102 11/25/11 2122 11/26/11 0448  BP: 135/80 157/89  157/99  Pulse: 70 69 72 71  Temp: 97.9 F (36.6 C) 98.7 F (37.1 C)  98.1 F (36.7 C)  TempSrc: Oral Oral  Oral  Resp: 16 18  20   Height:      Weight:    90.81 kg (200 lb 3.2 oz)  SpO2: 97% 100%  97%   Weight change: 1.361 kg (3 lb)  Intake/Output Summary (Last 24 hours) at 11/26/11 1122 Last data filed at 11/26/11 1100  Gross per 24 hour  Intake 3391.67 ml  Output   5351 ml  Net -1959.33 ml   Labs: Basic Metabolic Panel:  Lab 11/26/11 1610 11/25/11 0438 11/24/11 0433 11/23/11 0510 11/22/11 1510 11/22/11 1116  NA 140 140 140 142 -- 143  K 3.2* 3.4* 3.6 3.0* -- 3.6  CL 102 102 101 101 -- 102  CO2 28 27 27 29  -- --  GLUCOSE 81 84 90 92 -- 83  BUN 47* 56* 63* 62* -- 74*  CREATININE 3.44* 3.89* 4.72* 4.46* -- 4.80*  ALB -- -- -- -- -- --  CALCIUM 8.5 8.6 8.7 9.0 -- --  PHOS -- -- -- -- 4.8* --   Liver Function Tests:  Lab 11/25/11 0438  AST 14  ALT 9  ALKPHOS 73  BILITOT 0.3  PROT 6.8  ALBUMIN 2.9*   No results found for this basename: LIPASE:3,AMYLASE:3 in the last 168 hours No results found for this basename: AMMONIA:3 in the last 168 hours CBC:  Lab 11/26/11 0445 11/25/11 0438 11/24/11 0433 11/23/11 0510  WBC 6.1 6.6 7.9 5.7  NEUTROABS -- -- -- --  HGB 9.7* 9.8* 9.7* 9.9*  HCT 29.4* 30.3* 29.6* 30.5*  MCV 89.9 89.4 89.4 88.9  PLT 257 253 271 259   PT/INR: @labrcntip (inr:5) Cardiac Enzymes:  Lab 11/23/11 0640 11/22/11 2109 11/22/11 1325  CKTOTAL 115 135 139  CKMB 1.5 1.7 1.8  CKMBINDEX -- -- --  TROPONINI <0.30 <0.30 <0.30   CBG:  Lab 11/26/11 0911 11/25/11 0800 11/24/11 0728 11/23/11 0717  GLUCAP 82 80 75 98    Iron Studies:   Lab 11/22/11 1510  IRON 57  TIBC 276  TRANSFERRIN --  FERRITIN 490*    Physical Exam:  Blood  pressure 157/99, pulse 71, temperature 98.1 F (36.7 C), temperature source Oral, resp. rate 20, height 6\' 2"  (1.88 m), weight 90.81 kg (200 lb 3.2 oz), SpO2 97.00%.  Gen: alert, WD WN no distress   Neck: no JVD, no bruits or LAN  Chest: clear bilat  Heart: regular, no rub or gallop  Abdomen: soft, nontender, large midline scar from splenectomy  GU: foley in place  Ext: no edema  Neuro: alert, Ox3, no focal deficit  Heme/Lymph: no bruising or LAN   2008- SCr 1.3 GFR 60  2011- SCr 1.25  11/22/11- SCr 4.80 > 4.46 > 4.72  UA- no prot, 0-2 rbc/wbc  UNa- 60  [place]- R 14cm, L 13.8cm, bilat moderate hydroneph and hydroureter, distended bladder 2136 mL with 5x6 cm mass  CXR- NAD   Impression/Plan  1. Renal failure - due to obstruction obviously. He has early stage III CKD (baseline SCr 1.25 in 2011). Renal function improving.  Will sign off, no new recommendations. Have arranged f/u for patient with [facility] #[phone] Dr. Hyman Hopes  Tues July 16th, 8:00 am for acute on CRF. Please give pt appt info at time of discharge.  2. Chronic bladder outlet obstruction with massively enlarged prostate on [place] - per urology/primary.  3. Hx of HTN - avoid ACEI and ARB indefinitely for now for this patient. Getting CCB, labetalol and hydralazine here.  4. 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

## 2011-11-26 NOTE — Discharge Summary (Addendum)
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 He was found to have apparently acute on chronic renal failure with bun/cr of 62/4.46, a bump from 15/1.25 on 01/29/2010. CT abdomen and pelvis and ultrasound eventually showed bilateral hydronephrosis and enlarged prostate, apparently pushing on bladder wall. Patient was seen by urology(Dr [name]) and nephrology(Dr [name]) who helped with his management. The renal function has continued to improve with foley, and ivf, to 47/3.44 today. Mr [name] should keep foley in place and follow with Dr [name] and Dr [name] as scheduled. He also had anemia from hematuria, and complained of burning feet for a number of years. Iro panel was unremarkable, including vitamin b12. He was placed on trial of neurontin. He will d/c today, in stable condition. He should avoid nsaids and fill prescriptions for antihypertensives, etc as given.   Medication List  As of 11/26/2011  3:37 PM   STOP taking these medications         ciprofloxacin 500 MG tablet      doxazosin 4 MG tablet      hydrochlorothiazide 25 MG tablet      ibuprofen 800 MG tablet         TAKE these medications         amLODipine 5 MG tablet   Commonly known as: NORVASC   Take 1 tablet (5 mg total) by mouth daily.      finasteride 5 MG tablet   Commonly known as: PROSCAR   Take 1 tablet (5 mg total) by mouth daily.      gabapentin 100 MG capsule   Commonly known as: NEURONTIN   Take 1 capsule (100 mg total) by mouth 2 (two) times daily.        hydrALAZINE 25 MG tablet   Commonly known as: APRESOLINE   Take 1 tablet (25 mg total) by mouth every 8 (eight) hours.      HYDROcodone-acetaminophen 5-325 MG per tablet   Commonly known as: NORCO   Take 1-2 tablets by mouth every 4 (four) hours as needed.      labetalol 200 MG tablet   Commonly known as: NORMODYNE   Take 1 tablet (200 mg total) by mouth 2 (two) times daily.      Tamsulosin HCl 0.4 MG Caps   Commonly known as: FLOMAX   Take 2 capsules (0.8 mg total) by mouth daily after supper.             Day of Discharge BP 157/99  Pulse 71  Temp 98.1 F (36.7 C) (Oral)  Resp 20  Ht 6\' 2"  (1.88 m)  Wt 90.81 kg (200 lb 3.2 oz)  BMI 25.70 kg/m2  SpO2 97%  Physical Exam: At baseline.  Results for orders placed during the hospital encounter of 11/22/11 (from the past 24 hour(s))  VITAMIN B12     Status: Normal   Collection Time   11/25/11  5:28 PM      Component Value Range   Vitamin B-12 590  211 - 911 pg/mL  CBC     Status: Abnormal   Collection Time   11/26/11  4:45 AM  Component Value Range   WBC 6.1  4.0 - 10.5 K/uL   RBC 3.27 (*) 4.22 - 5.81 MIL/uL   Hemoglobin 9.7 (*) 13.0 - 17.0 g/dL   HCT 16.1 (*) 09.6 - 04.5 %   MCV 89.9  78.0 - 100.0 fL   MCH 29.7  26.0 - 34.0 pg   MCHC 33.0  30.0 - 36.0 g/dL   RDW 40.9  81.1 - 91.4 %   Platelets 257  150 - 400 K/uL  BASIC METABOLIC PANEL     Status: Abnormal   Collection Time   11/26/11  4:45 AM      Component Value Range   Sodium 140  135 - 145 mEq/L   Potassium 3.2 (*) 3.5 - 5.1 mEq/L   Chloride 102  96 - 112 mEq/L   CO2 28  19 - 32 mEq/L   Glucose, Bld 81  70 - 99 mg/dL   BUN 47 (*) 6 - 23 mg/dL   Creatinine, Ser 7.82 (*) 0.50 - 1.35 mg/dL   Calcium 8.5  8.4 - 95.6 mg/dL   GFR calc non Af Amer 18 (*) >90 mL/min   GFR calc Af Amer 20 (*) >90 mL/min  GLUCOSE, CAPILLARY     Status: Normal   Collection Time   11/26/11  9:11 AM      Component Value Range   Glucose-Capillary 82  70 - 99 mg/dL     Disposition: home today.   Follow-up Appts: Discharge Orders    Future Orders Please Complete By Expires   Diet - low sodium heart healthy      Increase activity slowly           Tests Needing Follow-up: Cbc/bmp.  Time spent in discharge (includes decision making & examination of pt): 25 minutes  Signed: Ashley [name] 11/26/2011, 3:37 PM

## 2011-11-26 NOTE — Progress Notes (Signed)
  Subjective: Patient reports Feels better.  No more suprapubic pain.  Objective: Vital signs in last 24 hours: Temp:  [98.1 F (36.7 C)-98.7 F (37.1 C)] 98.1 F (36.7 C) (06/21 0448) Pulse Rate:  [69-72] 71  (06/21 0448) Resp:  [18-20] 20  (06/21 0448) BP: (157)/(89-99) 157/99 mmHg (06/21 0448) SpO2:  [97 %-100 %] 97 % (06/21 0448) Weight:  [200 lb 3.2 oz (90.81 kg)] 200 lb 3.2 oz (90.81 kg) (06/21 0448)  Intake/Output from previous day: 06/20 0701 - 06/21 0700 In: 3991.7 [P.O.:1080; I.V.:2911.7] Out: 5575 [Urine:5575] Intake/Output this shift: Total I/O In: -  Out: 7301 [Urine:7300; Stool:1]  Physical Exam:  General:Alert and oriented. YN:WGNFAOZ soft, non distended, non tender Foley draining clear urine  Lungs - Normal respiratory effort, chest expands symmetrically.  Abdomen - Soft, non-tender & non-distended  Lab Results:  Basename 11/26/11 0445 11/25/11 0438 11/24/11 0433  HGB 9.7* 9.8* 9.7*  HCT 29.4* 30.3* 29.6*   BMET  Basename 11/26/11 0445 11/25/11 0438  NA 140 140  K 3.2* 3.4*  CL 102 102  CO2 28 27  GLUCOSE 81 84  BUN 47* 56*  CREATININE 3.44* 3.89*  CALCIUM 8.5 8.6    Basename 11/25/11 0438  LABPT --  INR 1.19   No results found for this basename: LABURIN:1 in the last 72 hours Results for orders placed during the hospital encounter of 02/05/07  URINE CULTURE     Status: Normal   Collection Time   02/05/07 12:25 PM      Component Value Range Status Comment   Specimen Description URINE, RANDOM   Final    Special Requests NONE   Final    Colony Count NO GROWTH   Final    Culture NO GROWTH   Final    Report Status 02/06/2007 FINAL   Final     Studies/Results: US Renal  11/26/2011  *RADIOLOGY REPORT*  Clinical Data: Bilateral hydronephrosis.  11/22/2011  RENAL/URINARY TRACT ULTRASOUND COMPLETE  Comparison:  CT scan dated 11/24/2011 and renal ultrasound dated 11/22/2011  Findings:  Right Kidney:  12.7 cm in length.  Right hydronephrosis  has diminished since the prior study.  Simple appearing 4.4 cm cyst in the lower pole.  Left Kidney:  13.3 cm in length.  Marked left hydronephrosis is perhaps minimally decreased.  Bladder:  Marked thickening of the bladder wall.  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  The association with the prostate gland is indeterminate.  Foley catheter is in place.  IMPRESSION:  1.  Improved right hydronephrosis. 2.  Perhaps slight improvement in left hydronephrosis. 3.  Lobulated soft tissue mass in the bladder with marked thickening of the bladder wall.  I suspect this is originating from the prostate gland.  Original Report Authenticated By: Gwynn Burly, M.D.    Assessment/Plan:  Urinary retention.  Renal insufficiency, Bilateral hydronephrosis.  Leave Foley indwelling.  Home with catheter.  Will follow as outpatient.  He will need cystoscopy.   LOS: 4 days   Kennita Pavlovich-HENRY 11/26/2011, 6:44 PM

## 2011-11-29 ENCOUNTER — Other Ambulatory Visit (HOSPITAL_COMMUNITY): Payer: Self-pay | Admitting: Internal Medicine

## 2011-12-04 ENCOUNTER — Inpatient Hospital Stay (HOSPITAL_COMMUNITY)
Admission: EM | Admit: 2011-12-04 | Discharge: 2011-12-08 | DRG: 313 | Disposition: A | Discharge status: Home / Self Care | Payer: Medicaid Other | Attending: Internal Medicine | Admitting: Internal Medicine

## 2011-12-04 ENCOUNTER — Encounter (HOSPITAL_COMMUNITY): Payer: Self-pay | Admitting: *Deleted

## 2011-12-04 ENCOUNTER — Emergency Department (HOSPITAL_COMMUNITY): Payer: Medicaid Other

## 2011-12-04 DIAGNOSIS — N179 Acute kidney failure, unspecified: Secondary | ICD-10-CM | POA: Diagnosis present

## 2011-12-04 DIAGNOSIS — R072 Precordial pain: Secondary | ICD-10-CM

## 2011-12-04 DIAGNOSIS — Z79899 Other long term (current) drug therapy: Secondary | ICD-10-CM

## 2011-12-04 DIAGNOSIS — C61 Malignant neoplasm of prostate: Secondary | ICD-10-CM | POA: Diagnosis present

## 2011-12-04 DIAGNOSIS — D649 Anemia, unspecified: Secondary | ICD-10-CM

## 2011-12-04 DIAGNOSIS — N138 Other obstructive and reflux uropathy: Secondary | ICD-10-CM | POA: Diagnosis present

## 2011-12-04 DIAGNOSIS — N1330 Unspecified hydronephrosis: Secondary | ICD-10-CM | POA: Diagnosis present

## 2011-12-04 DIAGNOSIS — D638 Anemia in other chronic diseases classified elsewhere: Secondary | ICD-10-CM | POA: Diagnosis present

## 2011-12-04 DIAGNOSIS — N189 Chronic kidney disease, unspecified: Secondary | ICD-10-CM | POA: Diagnosis present

## 2011-12-04 DIAGNOSIS — N401 Enlarged prostate with lower urinary tract symptoms: Secondary | ICD-10-CM | POA: Diagnosis not present

## 2011-12-04 DIAGNOSIS — N139 Obstructive and reflux uropathy, unspecified: Secondary | ICD-10-CM | POA: Diagnosis present

## 2011-12-04 DIAGNOSIS — D1739 Benign lipomatous neoplasm of skin and subcutaneous tissue of other sites: Secondary | ICD-10-CM | POA: Diagnosis present

## 2011-12-04 DIAGNOSIS — I129 Hypertensive chronic kidney disease with stage 1 through stage 4 chronic kidney disease, or unspecified chronic kidney disease: Secondary | ICD-10-CM | POA: Diagnosis present

## 2011-12-04 DIAGNOSIS — N289 Disorder of kidney and ureter, unspecified: Secondary | ICD-10-CM

## 2011-12-04 DIAGNOSIS — I10 Essential (primary) hypertension: Secondary | ICD-10-CM

## 2011-12-04 DIAGNOSIS — R079 Chest pain, unspecified: Principal | ICD-10-CM

## 2011-12-04 HISTORY — DX: Urinary tract infection, site not specified: N39.0

## 2011-12-04 LAB — URINALYSIS, ROUTINE W REFLEX MICROSCOPIC
Glucose, UA: NEGATIVE mg/dL
Ketones, ur: NEGATIVE mg/dL
Nitrite: NEGATIVE
Protein, ur: 30 mg/dL — AB
pH: 7.5 (ref 5.0–8.0)

## 2011-12-04 LAB — CBC WITH DIFFERENTIAL/PLATELET
Basophils Absolute: 0.1 10*3/uL (ref 0.0–0.1)
Basophils Relative: 1 % (ref 0–1)
Eosinophils Absolute: 0.2 10*3/uL (ref 0.0–0.7)
Eosinophils Relative: 3 % (ref 0–5)
HCT: 28.4 % — ABNORMAL LOW (ref 39.0–52.0)
Lymphocytes Relative: 32 % (ref 12–46)
MCH: 29.9 pg (ref 26.0–34.0)
MCHC: 33.5 g/dL (ref 30.0–36.0)
MCV: 89.3 fL (ref 78.0–100.0)
Monocytes Absolute: 0.3 10*3/uL (ref 0.1–1.0)
Platelets: 258 10*3/uL (ref 150–400)
RDW: 13.1 % (ref 11.5–15.5)
WBC: 7.4 10*3/uL (ref 4.0–10.5)

## 2011-12-04 LAB — COMPREHENSIVE METABOLIC PANEL
ALT: 12 U/L (ref 0–53)
AST: 16 U/L (ref 0–37)
Albumin: 3 g/dL — ABNORMAL LOW (ref 3.5–5.2)
CO2: 30 mEq/L (ref 19–32)
Calcium: 8.8 mg/dL (ref 8.4–10.5)
Creatinine, Ser: 3.24 mg/dL — ABNORMAL HIGH (ref 0.50–1.35)
GFR calc non Af Amer: 19 mL/min — ABNORMAL LOW (ref >90)
Sodium: 140 mEq/L (ref 135–145)
Total Protein: 6.9 g/dL (ref 6.0–8.3)

## 2011-12-04 LAB — CARDIAC PANEL(CRET KIN+CKTOT+MB+TROPI)
CK, MB: 1.1 ng/mL (ref 0.3–4.0)
CK, MB: 1 ng/mL (ref 0.3–4.0)
Total CK: 89 U/L (ref 7–232)
Troponin I: <0.3 ng/mL (ref <0.30)
Troponin I: <0.3 ng/mL (ref <0.30)

## 2011-12-04 LAB — TSH: TSH: 2.564 u[IU]/mL (ref 0.350–4.500)

## 2011-12-04 LAB — PHOSPHORUS: Phosphorus: 3.6 mg/dL (ref 2.3–4.6)

## 2011-12-04 LAB — URINE MICROSCOPIC-ADD ON

## 2011-12-04 LAB — PROTIME-INR: INR: 1.23 (ref 0.00–1.49)

## 2011-12-04 LAB — HEMOGLOBIN A1C
Hgb A1c MFr Bld: 5.5 % (ref <5.7)
Mean Plasma Glucose: 111 mg/dL (ref <117)

## 2011-12-04 MED ORDER — AMLODIPINE BESYLATE 5 MG PO TABS
5.0000 mg | ORAL_TABLET | Freq: Every day | ORAL | Status: DC
  Administered 2011-12-05 – 2011-12-08 (×4): 5 mg via ORAL
  Filled 2011-12-04 (×4): qty 1

## 2011-12-04 MED ORDER — TAMSULOSIN HCL 0.4 MG PO CAPS
0.8000 mg | ORAL_CAPSULE | Freq: Every day | ORAL | Status: DC
  Administered 2011-12-04 – 2011-12-07 (×4): 0.8 mg via ORAL
  Filled 2011-12-04 (×5): qty 2

## 2011-12-04 MED ORDER — ONDANSETRON HCL 4 MG PO TABS: 4.0000 mg | ORAL_TABLET | Freq: Four times a day (QID) | ORAL | Status: DC | PRN

## 2011-12-04 MED ORDER — HYDRALAZINE HCL 25 MG PO TABS
25.0000 mg | ORAL_TABLET | Freq: Three times a day (TID) | ORAL | Status: DC
  Administered 2011-12-04 – 2011-12-08 (×12): 25 mg via ORAL
  Filled 2011-12-04 (×16): qty 1

## 2011-12-04 MED ORDER — LABETALOL HCL 200 MG PO TABS
200.0000 mg | ORAL_TABLET | Freq: Two times a day (BID) | ORAL | Status: DC
  Administered 2011-12-04 – 2011-12-08 (×8): 200 mg via ORAL
  Filled 2011-12-04 (×9): qty 1

## 2011-12-04 MED ORDER — SODIUM CHLORIDE 0.9 % IJ SOLN
3.0000 mL | Freq: Two times a day (BID) | INTRAMUSCULAR | Status: DC
  Administered 2011-12-04 – 2011-12-08 (×8): 3 mL via INTRAVENOUS

## 2011-12-04 MED ORDER — ASPIRIN EC 81 MG PO TBEC
81.0000 mg | DELAYED_RELEASE_TABLET | Freq: Every day | ORAL | Status: DC
  Administered 2011-12-05 – 2011-12-08 (×4): 81 mg via ORAL
  Filled 2011-12-04 (×4): qty 1

## 2011-12-04 MED ORDER — GABAPENTIN 100 MG PO CAPS
100.0000 mg | ORAL_CAPSULE | Freq: Two times a day (BID) | ORAL | Status: DC
  Administered 2011-12-04 – 2011-12-08 (×8): 100 mg via ORAL
  Filled 2011-12-04 (×9): qty 1

## 2011-12-04 MED ORDER — FINASTERIDE 5 MG PO TABS
5.0000 mg | ORAL_TABLET | Freq: Every day | ORAL | Status: DC
  Administered 2011-12-05 – 2011-12-08 (×4): 5 mg via ORAL
  Filled 2011-12-04 (×4): qty 1

## 2011-12-04 MED ORDER — DEXTROSE 5 % IV SOLN
1.0000 g | INTRAVENOUS | Status: DC
  Administered 2011-12-04 – 2011-12-07 (×4): 1 g via INTRAVENOUS
  Filled 2011-12-04 (×5): qty 10

## 2011-12-04 MED ORDER — HYDROCODONE-ACETAMINOPHEN 5-325 MG PO TABS: 1.0000 | ORAL_TABLET | ORAL | Status: DC | PRN

## 2011-12-04 MED ORDER — MORPHINE SULFATE 2 MG/ML IJ SOLN: 1.0000 mg | INTRAMUSCULAR | Status: DC | PRN

## 2011-12-04 MED ORDER — ONDANSETRON HCL 4 MG/2ML IJ SOLN: 4.0000 mg | Freq: Four times a day (QID) | INTRAMUSCULAR | Status: DC | PRN

## 2011-12-04 NOTE — ED Provider Notes (Signed)
History     CSN: [id number]  Arrival date & time 12/04/11  1358   First MD Initiated Contact with Patient 12/04/11 1403      Chief Complaint  Patient presents with  . Chest Pain    (Consider location/radiation/quality/duration/timing/severity/associated sxs/prior treatment) HPI Comments: Recent admission for renal failure hydronephrosis presenting with sharp left-sided chest pain that started one hour ago radiates to the back. No shortness of breath, nausea, vomiting. Patient has no cardiac history. He has had similar pain in the past but not as extensive. He was given nitroglycerin and aspirin by EMS with some improvement. Denies any leg pain or swelling. He reports having a negative stress test >10 years ago through Dr. Debby Bud office.  The history is provided by the patient and the EMS personnel.    Past Medical History  Diagnosis Date  . Hypertension   . Anemia   . Dysuria   . UTI (urinary tract infection)     Past Surgical History  Procedure Date  . Spleenectomy     No family history on file.  History  Substance Use Topics  . Smoking status: Former Smoker    Types: Cigarettes  . Smokeless tobacco: Never Used  . Alcohol Use: No      Review of Systems  Constitutional: Negative for fever, activity change and appetite change.  HENT: Negative for congestion and rhinorrhea.   Respiratory: Positive for chest tightness and shortness of breath. Negative for cough.   Cardiovascular: Positive for chest pain.  Gastrointestinal: Negative for nausea, vomiting and abdominal pain.  Genitourinary: Positive for decreased urine volume and difficulty urinating. Negative for dysuria, hematuria and flank pain.  Musculoskeletal: Negative for back pain.  Neurological: Negative for dizziness, weakness and headaches.    Allergies  Review of patient's allergies indicates no known allergies.  Home Medications   No current outpatient prescriptions on file.  BP 163/99  Pulse 68   Temp 97.5 F (36.4 C) (Oral)  Resp 18  Ht 6\' 2"  (1.88 m)  Wt 200 lb (90.719 kg)  BMI 25.68 kg/m2  SpO2 97%  Physical Exam  Constitutional: He is oriented to person, place, and time. He appears well-developed and well-nourished. No distress.  HENT:  Head: Normocephalic and atraumatic.  Mouth/Throat: Oropharynx is clear and moist. No oropharyngeal exudate.  Eyes: Conjunctivae are normal. Pupils are equal, round, and reactive to light.  Neck: Normal range of motion. Neck supple.  Cardiovascular: Normal rate, regular rhythm and normal heart sounds.   No murmur heard. Pulmonary/Chest: Effort normal and breath sounds normal. No respiratory distress.  Abdominal: Soft. There is no tenderness. There is no rebound and no guarding.  Musculoskeletal: Normal range of motion. He exhibits no edema and no tenderness.  Neurological: He is alert and oriented to person, place, and time. No cranial nerve deficit.  Skin: Skin is warm.    ED Course  Procedures (including critical care time)  Labs Reviewed  PRO B NATRIURETIC PEPTIDE - Abnormal; Notable for the following:    Pro B Natriuretic peptide (BNP) 796.7 (*)     All other components within normal limits  CBC WITH DIFFERENTIAL - Abnormal; Notable for the following:    RBC 3.18 (*)     Hemoglobin 9.5 (*)     HCT 28.4 (*)     All other components within normal limits  COMPREHENSIVE METABOLIC PANEL - Abnormal; Notable for the following:    BUN 51 (*)     Creatinine, Ser 3.24 (*)  Albumin 3.0 (*)     Total Bilirubin 0.2 (*)     GFR calc non Af Amer 19 (*)     GFR calc Af Amer 22 (*)     All other components within normal limits  PROTIME-INR - Abnormal; Notable for the following:    Prothrombin Time 15.8 (*)     All other components within normal limits  URINALYSIS, ROUTINE W REFLEX MICROSCOPIC - Abnormal; Notable for the following:    Hgb urine dipstick SMALL (*)     Protein, ur 30 (*)     Leukocytes, UA LARGE (*)     All other  components within normal limits  CARDIAC PANEL(CRET KIN+CKTOT+MB+TROPI)  URINE MICROSCOPIC-ADD ON  MAGNESIUM  PHOSPHORUS  TSH  PRO B NATRIURETIC PEPTIDE  HEMOGLOBIN A1C  BASIC METABOLIC PANEL  CBC  CARDIAC PANEL(CRET KIN+CKTOT+MB+TROPI)   Dg Chest 2 View  12/04/2011  *RADIOLOGY REPORT*  Clinical Data: Chest pain and shortness of breath  CHEST - 2 VIEW  Comparison: 11/22/11  Findings: Heart size is normal.  No pleural effusion or pulmonary edema.  No airspace consolidation.  Review of the visualized osseous structures is unremarkable.  IMPRESSION:  1.  No active cardiopulmonary abnormalities.  Original Report Authenticated By: Rosealee Albee, M.D.     1. Chest pain   2. Renal insufficiency       MDM  L sided chest pain radiating to back.  No nausea, vomiting, SOB.  No cardiac history.  LVH on EKG. 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 Troponin negative. Pain resolved.  Patient unable to receive contrast 2/2 Cr elevation.  Unable to obtain stress echo in CDU on weekend. Will Admit for ACS rule out.      Date: 12/04/2011  Rate: 61  Rhythm: normal sinus rhythm  QRS Axis: normal  Intervals: normal  ST/T Wave abnormalities: nonspecific ST/T changes  Conduction Disutrbances:none  Narrative Interpretation: LVH, T wave inversions laterally, unchanged  Old EKG Reviewed: unchanged    Glynn Octave, MD 12/04/11 1756

## 2011-12-04 NOTE — H&P (Signed)
[facility] History and Physical  [name] WUJ:[id number] DOB: 06/24/1949 DOA: 12/04/2011  Referring physician: ED doctor PCP: Illene Regulus, MD   Chief Complaint: chest pain  HPI:  Pt is 61 yo male who presents with sudden onset substernal chest pain, intermittent in nature, 7/10 in severity when present, non radiating, no specific aggravating or alleviating factors, no shortness of breath, no fevers, no chill, no specific abdominal or urinary concerns.   Review of Systems:   Constitutional: Negative for fever, chills and malaise/fatigue. Negative for diaphoresis.  HENT: Negative for hearing loss, ear pain, nosebleeds, congestion, sore throat, neck pain, tinnitus and ear discharge.   Eyes: Negative for blurred vision, double vision, photophobia, pain, discharge and redness.  Respiratory: Negative for cough, hemoptysis, sputum production, shortness of breath, wheezing and stridor.   Cardiovascular: Positive for chest pain. Negative for palpitations, orthopnea, claudication and leg swelling.  Gastrointestinal: Negative for nausea, vomiting and abdominal pain. Negative for heartburn, constipation, blood in stool and melena.  Genitourinary: Negative for dysuria, urgency, frequency, hematuria and flank pain.  Musculoskeletal: Negative for myalgias, back pain, joint pain and falls.  Skin: Negative for itching and rash.  Neurological: Negative for dizziness and weakness. Negative for tingling, tremors, sensory change, speech change, focal weakness, loss of consciousness and headaches.  Endo/Heme/Allergies: Negative for environmental allergies and polydipsia. Does not bruise/bleed easily.  Psychiatric/Behavioral: Negative for suicidal ideas. The patient is not nervous/anxious.      Past Medical History  Diagnosis Date  . Hypertension   . Anemia   . Dysuria   . UTI (urinary tract infection)    Past Surgical History  Procedure Date  . Spleenectomy    Social History:  reports  that he has quit smoking. His smoking use included Cigarettes. He has never used smokeless tobacco. He reports that he does not drink alcohol or use illicit drugs.  No Known Allergies  No family history on file.  Prior to Admission medications   Medication Sig Start Date End Date Taking? Authorizing Provider  amLODipine (NORVASC) 5 MG tablet Take 1 tablet (5 mg total) by mouth daily. 11/26/11 11/25/12 Yes Simbiso Ranga, MD  finasteride (PROSCAR) 5 MG tablet Take 1 tablet (5 mg total) by mouth daily. 11/26/11 11/25/12 Yes Simbiso Ranga, MD  gabapentin (NEURONTIN) 100 MG capsule Take 1 capsule (100 mg total) by mouth 2 (two) times daily. 11/26/11 11/25/12 Yes Simbiso Ranga, MD  hydrALAZINE (APRESOLINE) 25 MG tablet Take 1 tablet (25 mg total) by mouth every 8 (eight) hours. 11/26/11 11/25/12 Yes Simbiso Ranga, MD  HYDROcodone-acetaminophen (NORCO) 5-325 MG per tablet Take 1-2 tablets by mouth every 4 (four) hours as needed. 11/26/11 12/06/11 Yes Simbiso Ranga, MD  labetalol (NORMODYNE) 200 MG tablet Take 1 tablet (200 mg total) by mouth 2 (two) times daily. 11/26/11 11/25/12 Yes Simbiso Ranga, MD  Tamsulosin HCl (FLOMAX) 0.4 MG CAPS Take 2 capsules (0.8 mg total) by mouth daily after supper. 11/26/11  Yes Simbiso Ranga, MD   Physical Exam: Filed Vitals:   12/04/11 1410 12/04/11 1616  BP: 114/78 151/97  Pulse: 64   Temp: 97.9 F (36.6 C)   TempSrc: Oral   Resp: 20 18  Height: 6\' 2"  (1.88 m)   Weight: 90.719 kg (200 lb)   SpO2: 100% 100%   BP 151/97  Pulse 62  Temp 97.9 F (36.6 C) (Oral)  Resp 18  Ht 6\' 2"  (1.88 m)  Wt 90.719 kg (200 lb)  BMI 25.68 kg/m2  SpO2 100%  General  Appearance:    Alert, cooperative, no distress, appears stated age  Head:    Normocephalic, without obvious abnormality, atraumatic  Eyes:    PERRL, conjunctiva/corneas clear, EOM's intact, fundi    benign, both eyes       Throat:   Lips, mucosa, and tongue normal; teeth and gums normal  Neck:   Supple, symmetrical,  trachea midline, no adenopathy;       thyroid:  No enlargement/tenderness/nodules; no carotid   bruit or JVD  Lungs:     Clear to auscultation bilaterally, respirations unlabored  Heart:    Regular rate and rhythm, S1 and S2 normal, no murmur, rub   or gallop  Abdomen:     Soft, non-tender, bowel sounds active all four quadrants,    no masses, no organomegaly  Extremities:   Extremities normal, atraumatic, no cyanosis or edema  Pulses:   2+ and symmetric all extremities  Skin:   Skin color, texture, turgor normal, no rashes or lesions  Neurologic:   CNII-XII intact. Normal strength, sensation and reflexes      throughout    Labs on Admission:  Basic Metabolic Panel:  Lab 12/04/11 1610  NA 140  K 4.1  CL 101  CO2 30  GLUCOSE 96  BUN 51*  CREATININE 3.24*  CALCIUM 8.8  MG --  PHOS --   Liver Function Tests:  Lab 12/04/11 1510  AST 16  ALT 12  ALKPHOS 92  BILITOT 0.2*  PROT 6.9  ALBUMIN 3.0*   CBC:  Lab 12/04/11 1510  WBC 7.4  NEUTROABS 4.5  HGB 9.5*  HCT 28.4*  MCV 89.3  PLT 258   Cardiac Enzymes:  Lab 12/04/11 1510  CKTOTAL 89  CKMB 1.1  CKMBINDEX --  TROPONINI <0.30    Radiological Exams on Admission: Dg Chest 2 View  12/04/2011  *RADIOLOGY REPORT*  Clinical Data: Chest pain and shortness of breath  CHEST - 2 VIEW  Comparison: 11/22/11  Findings: Heart size is normal.  No pleural effusion or pulmonary edema.  No airspace consolidation.  Review of the visualized osseous structures is unremarkable.  IMPRESSION:  1.  No active cardiopulmonary abnormalities.  Original Report Authenticated By: Rosealee Albee, M.D.    EKG: pending  Assessment/Plan  Chest pain - will admit to telemetry and will rule out ACS - cycle cardiac enzymes, check TSH - provide supportive care with analgesia and antiemetics as needed  Acute on chronic renal failure - creatinine is trending down  Anemia of chronic disease - Hg and Hct are stable and at pt's baseline - CBC  in AM  Code Status: Full Family Communication: Pt at bedside Disposition Plan: Home when medically ready  Debbora Presto, MD  [facility] Pager [phone]  If 7PM-7AM, please contact night-coverage [email or web address] Password [facility] 12/04/2011, 4:30 PM

## 2011-12-04 NOTE — ED Notes (Signed)
Patient transported to X-ray 

## 2011-12-04 NOTE — ED Notes (Signed)
Pt has foley urine came from port

## 2011-12-04 NOTE — ED Notes (Signed)
Patient reported to have onset of chest pain, left sided, sharp in nature,  Radiating into his back x 1 hour,.  Patient denies n/v.  Patient with sob and diaphoresis.  Patient had recent tx for uti.  Patient has no known cardiac hx.  Patient given nitro x 4 and 325 mg aspirin.  Patient has 18g IV in the left forearm.  Patient with 100 cc fluids infused.  Patient pain with minimal changes with medications

## 2011-12-05 ENCOUNTER — Inpatient Hospital Stay (HOSPITAL_COMMUNITY): Payer: Medicaid Other

## 2011-12-05 DIAGNOSIS — N289 Disorder of kidney and ureter, unspecified: Secondary | ICD-10-CM

## 2011-12-05 DIAGNOSIS — R079 Chest pain, unspecified: Secondary | ICD-10-CM

## 2011-12-05 LAB — CBC
MCV: 89.3 fL (ref 78.0–100.0)
Platelets: 268 10*3/uL (ref 150–400)
RDW: 13.1 % (ref 11.5–15.5)
WBC: 7.2 10*3/uL (ref 4.0–10.5)

## 2011-12-05 LAB — BASIC METABOLIC PANEL
Calcium: 8.7 mg/dL (ref 8.4–10.5)
Creatinine, Ser: 3.03 mg/dL — ABNORMAL HIGH (ref 0.50–1.35)
GFR calc Af Amer: 24 mL/min — ABNORMAL LOW (ref >90)

## 2011-12-05 LAB — CARDIAC PANEL(CRET KIN+CKTOT+MB+TROPI)
CK, MB: 1 ng/mL (ref 0.3–4.0)
Total CK: 75 U/L (ref 7–232)
Troponin I: <0.3 ng/mL (ref <0.30)
Troponin I: <0.3 ng/mL (ref <0.30)

## 2011-12-05 LAB — GLUCOSE, CAPILLARY

## 2011-12-05 LAB — RETICULOCYTES
RBC.: 3.27 MIL/uL — ABNORMAL LOW (ref 4.22–5.81)
Retic Count, Absolute: 29.4 10*3/uL (ref 19.0–186.0)
Retic Ct Pct: 0.9 % (ref 0.4–3.1)

## 2011-12-05 LAB — IRON AND TIBC
Saturation Ratios: 29 % (ref 20–55)
UIBC: 151 ug/dL (ref 125–400)

## 2011-12-05 MED ORDER — ASPIRIN 81 MG PO TBEC: 81.0000 mg | DELAYED_RELEASE_TABLET | Freq: Every day | ORAL | Status: DC

## 2011-12-05 MED ORDER — CIPROFLOXACIN HCL 500 MG PO TABS: 500.0000 mg | ORAL_TABLET | Freq: Two times a day (BID) | ORAL | Status: DC

## 2011-12-05 MED ORDER — HYDROCODONE-ACETAMINOPHEN 5-325 MG PO TABS: 1.0000 | ORAL_TABLET | ORAL | Status: AC | PRN

## 2011-12-05 NOTE — Progress Notes (Signed)
Patient well known to me .... Will accept on my service and round today. Thanks

## 2011-12-05 NOTE — Progress Notes (Signed)
Subjective: Patient admitted with chest pain 7/10. Chart reviewed - he has had a nuclear stress test in Sept '11: IMPRESSION:  1. No evidence of myocardial ischemia.  2. Left ventricular enlargement with mild global hypokinesis.  3. Estimated Q G S ejection fraction 45%.  Last admission was for chest pain and urinary retention. At that time he had a PSA 107, CT abd-pelvis with very large prostate impacting on the bladder. He was discharged with foley catheter and was to see Dr. Brunilda [name] as an outpatient.   Complex social history: he is a native of [place] and was home for the past 18 months during which time he had hematuria, weight loss, night sweats and anemia. He reports a 8 day hospitalization which included 3 unit transfusion. He is unaware of any diagnosis to explain his illness. He did loose a lot of weight. He has returned but faces many obstacles: his employment contract was not renewed; he expended his savings while in hospital; his wife cannot join him due to immigration issues. He is in the process of applying for medicaid and other assistance.    Objective: Lab: Cardiac Panel (last 3 results)  Basename 12/05/11 0643 12/04/11 2235 12/04/11 1510  CKTOTAL 73 86 89  CKMB 0.9 1.0 1.1  TROPONINI <0.30 <0.30 <0.30  RELINDX RELATIVE INDEX IS INVALID RELATIVE INDEX IS INVALID RELATIVE INDEX IS INVALID    Lab Results  Component Value Date   WBC 7.2 12/05/2011   HGB 9.6* 12/05/2011   HCT 28.4* 12/05/2011   MCV 89.3 12/05/2011   PLT 268 12/05/2011   BMET    Component Value Date/Time   NA 140 12/05/2011 0643   K 3.8 12/05/2011 0643   CL 101 12/05/2011 0643   CO2 29 12/05/2011 0643   GLUCOSE 85 12/05/2011 0643   BUN 49* 12/05/2011 0643   CREATININE 3.03* 12/05/2011 0643   CALCIUM 8.7 12/05/2011 0643   GFRNONAA 21* 12/05/2011 0643   GFRAA 24* 12/05/2011 0643    Lab Results  Component Value Date   PSA 105.70* 11/24/2011     Imaging:   Physical Exam: Filed Vitals:   12/05/11 0518  BP:  150/78  Pulse:   Temp:   Resp:     Intake/Output Summary (Last 24 hours) at 12/05/11 1245 Last data filed at 12/05/11 1211  Gross per 24 hour  Intake      0 ml  Output   3651 ml  Net  -3651 ml   Gen'l - large framed African man in no distress but he has visibly lost considerable weight since I last saw him.  HEENT- C&S clear Neck - mass at the angle of the jaw right Cor - 2+ radial, RRR w/o murmur Pulm - normal respirations, no rales or wheezes. Abd- soft Neuro - A&O x 3.    Assessment/Plan: 1. Cardiac - enzymes are negative. He has had peripheral edema. Last EF 45% at Nuc stress.  Plan - 2 D echo to assess LV function, wall motion  2. GU - patient with a very large prostate with obstruction now with foley catheter. Also, very high PSA. 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 Will need outpatient follow up but he does have a financial debt to [name].  3. Anemia - chronic but not well defined. He has had colonoscopy about 10 years ago that was normal. 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 He has had night  sweats, anemia, weight loss. Plan - CT neck, may need biopsy.   [name] 12/05/2011, 12:19 PM

## 2011-12-06 DIAGNOSIS — N289 Disorder of kidney and ureter, unspecified: Secondary | ICD-10-CM

## 2011-12-06 LAB — BASIC METABOLIC PANEL
BUN: 45 mg/dL — ABNORMAL HIGH (ref 6–23)
CO2: 32 mEq/L (ref 19–32)
Glucose, Bld: 115 mg/dL — ABNORMAL HIGH (ref 70–99)
Potassium: 3.4 mEq/L — ABNORMAL LOW (ref 3.5–5.1)
Sodium: 143 mEq/L (ref 135–145)

## 2011-12-06 LAB — OCCULT BLOOD X 1 CARD TO LAB, STOOL: Fecal Occult Bld: NEGATIVE

## 2011-12-06 LAB — VITAMIN B12: Vitamin B-12: 311 pg/mL (ref 211–911)

## 2011-12-06 NOTE — Care Management Note (Unsigned)
    Page 1 of 2   12/08/2011     3:38:07 PM   CARE MANAGEMENT NOTE 12/08/2011  Patient:  [name], [name]   Account Number:  [id number]  Date Initiated:  12/06/2011  Documentation initiated by:  SIMMONS,Brockton Mckesson  Subjective/Objective Assessment:   ADMITTED WITH CHEST PAIN; LIVES AT HOME WITH 2 KIDS; WAS IPTA; USES [facility] ON [address] FOR RX.     Action/Plan:   DISCHARGE PLANNING DISCUSSED AT BEDSIDE.   Anticipated DC Date:  12/08/2011   Anticipated DC Plan:  HOME/SELF CARE  In-house referral  Financial Counselor      DC Planning Services  CM consult      [facility] Choice  DURABLE MEDICAL EQUIPMENT   Choice offered to / List presented to:     DME arranged  [facility]      DME agency  [facility]        Status of service:  In process, will continue to follow Medicare Important Message given?   (If response is "NO", the following Medicare IM given date fields will be blank) Date Medicare IM given:   Date Additional Medicare IM given:    Discharge Disposition:    Per UR Regulation:  Reviewed for med. necessity/level of care/duration of stay  If discussed at Long Length of Stay Meetings, dates discussed:    Comments:  12/08/11  1534  Nashayla Telleria SIMMONS RN, BSN [phone] REFERRAL PLACED TO [facility] WITH [facility] FOR [facility]E FOR DISCHARGE.   12/07/11  1104  Theresea Trautmann SIMMONS RN, BSN [phone] SPOKE WITH PT RE: PAYMENT PLAN FOR UROLOGY C/S; PT STATED HE HAS NO INCOME AND HIS FAMILY SPENT ALL THEIR MONEY FOR TRIP BACK TO [place] FROM [place]; DR NORINS NOTIFIED; WILL HAVE TO AWAIT MEDICAID APPROVAL; PT WILL F/U AT SOCIAL SERVICES [place] AS WILL FINANCIAL COUNSELOR.    12/06/11  1105  Alexyss Balzarini SIMMONS RN, BSN [phone] PT HAS MEDICAID APPLICATION IN PROCESS; REFERRAL PLACED TO FINANCIAL COUNSELOR FOR ASSISTANCE; PT WOULD BENEFIT FROM RECEIVING RX FOR GENERIC MEDS AT D/C; NCM WILL FOLLOW.

## 2011-12-06 NOTE — Progress Notes (Signed)
Subjective: [name] reports that he will have some pain at the tip of the penis, especially when the catheter is moved. No other complaints  Objective: Lab: Fe 63, TIBC 214, % sat 29, folate 6.4, retic % 0.9, absolute retic 29.4, B12 311 (nl)  Imaging: CT soft tissue neck June 30th: IMPRESSION:  Triangular shaped fat density lesion adjacent to the right  mandibular angle, most compatible with lipoma.  Partially calcified right thyroid nodule. This could be further  evaluated and followed as indicated with thyroid ultrasound.  Mucous retention cyst or polyp within the right maxillary sinus.   Physical Exam: Filed Vitals:   12/06/11 0557  BP: 146/76  Pulse:   Temp:   Resp:     Intake/Output Summary (Last 24 hours) at 12/06/11 0752 Last data filed at 12/06/11 0981  Gross per 24 hour  Intake    480 ml  Output   5650 ml  Net  -5170 ml   Gen'l - slender African man in no distress Cor- RRR PUlm - normal respirations     Assessment/Plan: 1. Cardiac - for 2D echo today. 2. GU - no change but there is a question as to whether the foley needs changing out. Plan - will contact Dr. Brunilda Payor 3. Anemia - no iron deficiency and normal range retic count. Question of anemia of chronic disease, i.e. Renal vs malignancy. Renal U/S June 21st with bilateral hydronephrosis from ureteral obstruction due to large prostate. Plan -  epo level 4. 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 No further evaluation indicated.    Casimiro Needle Kala Ambriz 12/06/2011, 7:41 AM

## 2011-12-06 NOTE — Plan of Care (Signed)
Problem: Phase I Progression Outcomes Goal: Voiding-avoid urinary catheter unless indicated Outcome: Not Met (add Reason) retention

## 2011-12-07 LAB — GLUCOSE, CAPILLARY: Glucose-Capillary: 83 mg/dL (ref 70–99)

## 2011-12-07 NOTE — Progress Notes (Signed)
Subjective: Mr. Capo is feeling better and stronger. He has no specific complaints  Objective: Lab: Epo level pending BMET    Component Value Date/Time   NA 143 12/06/2011 0920   K 3.4* 12/06/2011 0920   CL 102 12/06/2011 0920   CO2 32 12/06/2011 0920   GLUCOSE 115* 12/06/2011 0920   BUN 45* 12/06/2011 0920   CREATININE 2.92* 12/06/2011 0920   CALCIUM 9.1 12/06/2011 0920   GFRNONAA 22* 12/06/2011 0920   GFRAA 25* 12/06/2011 0920      Imaging: 2 D echo ordered Sunday June 30this pending.  Physical Exam: Filed Vitals:   12/07/11 0400  BP: 131/86  Pulse: 64  Temp: 98.2 F (36.8 C)  Resp: 18   Slender African man in no distress HEENT - C&S clear Cor- RRR Pulm - normal respirations Abd- soft    Assessment/Plan: 1. Cardiac - 2 D echo pending as completion of eval for cardiomyopathy 2. GU - needs GU follow-up. Will request help from social work service in regard to financial problems barring him from returning to urologist 3. Anemia - epo level pending 4. Lipoma right neck sable 5. CKD - patient with CKD with elevated creatinine - probably acute secondary to hydronephrosis from prostate enlargement and ureteral obstruction. Has foley in place.  Dispo - for D/C in AM   Allyna Pittsley 12/07/2011, 7:58 AM

## 2011-12-07 NOTE — Progress Notes (Signed)
  Echocardiogram 2D Echocardiogram has been performed.  [name] 12/07/2011, 9:12 AM

## 2011-12-08 ENCOUNTER — Other Ambulatory Visit: Payer: Self-pay | Admitting: Internal Medicine

## 2011-12-08 LAB — BASIC METABOLIC PANEL
CO2: 31 mEq/L (ref 19–32)
GFR calc non Af Amer: 22 mL/min — ABNORMAL LOW (ref >90)
Glucose, Bld: 97 mg/dL (ref 70–99)
Potassium: 3.1 mEq/L — ABNORMAL LOW (ref 3.5–5.1)
Sodium: 140 mEq/L (ref 135–145)

## 2011-12-08 MED ORDER — CIPROFLOXACIN HCL 500 MG PO TABS: 500.0000 mg | ORAL_TABLET | Freq: Two times a day (BID) | ORAL | Status: AC

## 2011-12-08 MED ORDER — LABETALOL HCL 200 MG PO TABS: 200.0000 mg | ORAL_TABLET | Freq: Two times a day (BID) | ORAL | Status: DC

## 2011-12-08 MED ORDER — GABAPENTIN 100 MG PO CAPS: 100.0000 mg | ORAL_CAPSULE | Freq: Two times a day (BID) | ORAL | Status: DC

## 2011-12-08 MED ORDER — FINASTERIDE 5 MG PO TABS: 5.0000 mg | ORAL_TABLET | Freq: Every day | ORAL | Status: DC

## 2011-12-08 MED ORDER — HYDRALAZINE HCL 25 MG PO TABS: 25.0000 mg | ORAL_TABLET | Freq: Three times a day (TID) | ORAL | Status: DC

## 2011-12-08 MED ORDER — AMLODIPINE BESYLATE 5 MG PO TABS: 5.0000 mg | ORAL_TABLET | Freq: Every day | ORAL | Status: DC

## 2011-12-08 MED ORDER — TAMSULOSIN HCL 0.4 MG PO CAPS: 0.8000 mg | ORAL_CAPSULE | Freq: Every day | ORAL | Status: DC

## 2011-12-08 NOTE — Discharge Summary (Signed)
[name], [name] NO.:  [id number]  MEDICAL RECORD NO.:  [id number]  LOCATION:  2022                         FACILITY:  [facility]  PHYSICIAN:  Rosalyn Gess. Sirron Francesconi, MD  DATE OF BIRTH:  06/24/1949  DATE OF ADMISSION:  12/04/2011 DATE OF DISCHARGE:                              DISCHARGE SUMMARY   ADDENDUM  PHYSICAL EXAMINATION AT DISCHARGE:  VITAL SIGNS:  Temperature was 98.3, blood pressure 147/88, pulse 68, respirations 19. GENERAL APPEARANCE:  This is a tall, large framed muscular appearing African  gentleman in no acute distress. HEENT:  Normocephalic, atraumatic.  Conjunctivae and sclerae are clear. Pupils are equal, round, and reactive. NECK:  Supple.  He does have a large soft tissue mass in the angle of the jaw on the right which is nontender. CHEST:  No deformity. LUNGS:  The patient is moving air well with no rales, wheezes, or rhonchi. CARDIOVASCULAR:  2+ radial pulses, a quiet precordium with a regular rate and rhythm. ABDOMEN:  The patient has positive bowel sounds in all 4 quadrants.  No guarding or rebound was appreciated. GENITALIA:  The patient has an indwelling Foley catheter. EXTREMITIES:  No deformities were noted.  NEUROLOGIC:  The patient is awake, alert.  He is oriented to person, place, time and context. SKIN:  Clear with no obvious skin lesions.     Rosalyn Gess Kristy Schomburg, MD     MEN/MEDQ  D:  12/08/2011  T:  12/08/2011  Job:  [id number]

## 2011-12-08 NOTE — Discharge Summary (Signed)
[name], [name] NO.:  [id number]  MEDICAL RECORD NO.:  [id number]  LOCATION:  2022                         FACILITY:  [facility]  PHYSICIAN:  Rosalyn Gess. Osaze Hubbert, MD  DATE OF BIRTH:  06/24/1949  DATE OF ADMISSION:  12/04/2011 DATE OF DISCHARGE:                              DISCHARGE SUMMARY   ADMITTING DIAGNOSES: 1. Chest pain, rule out myocardial infarction. 2. Urinary retention with acute renal insufficiency. 3. Abdominal pain. 4. Anemia.  DISCHARGE DIAGNOSES: 1. Chest pain, rule out myocardial infarction. 2. Urinary retention with acute renal insufficiency. 3. Abdominal pain. 4. Anemia.  CONSULTANTS:  None.  PROCEDURES: 1. Chest x-ray, 2-view on day of admission with no active     cardiopulmonary abnormalities. 2. CT scan of the soft tissues of the neck, which revealed a lipoma at     the angle of the jaw on the right with no lymphadenopathy.  Previous studies include a renal ultrasound on November 26, 2011 at the last admission, which revealed improving right hydronephrosis, slight improvement in left hydronephrosis, lobulated soft tissue mass, no bladder with marked thickening of the bladder wall, which was thought to originate from the prostate gland.  Prior procedure was November 24, 2011.  CT scan of the abdomen and pelvis with a markedly enlarged prostate gland protruding into the urinary bladder, concerning for the possibility of prostate neoplasm.  A bladder mass is less likely based on the current noncontrasted appearance. Moderate bilateral hydronephrosis was noted.  Nonspecific liver masses were noted.  Minimal bilateral lower lobe atelectasis was noted, a small supraumbilical ventral hernia containing peritoneal fluid was noted.  HISTORY OF PRESENT ILLNESS:  [name] is a delightful 61 year old native of [place] who was home for the last 18 months.  He reports that while he was in [place], he became quite ill and had an 8-day hospitalization.   He had a profound anemia and did require 3-unit transfusion.  By his report, he never was given an explanation for why he was anemic.  The patient has also had hematuria and difficulty with urinary flow, but again he has very little information about his prior workup.  The patient expended all of his resources to return to this country so he could obtain better medical care.  He was admitted to the hospital several weeks ago.  He presented with a chest pain. His work up for acute renal failure revealed an enlarged prostate suspicious for prostate neoplasm with a PSA that was 107.  He had acute urinary retention due to the urethral obstruction leading to ureteral obstruction and hydronephrosis.  He was seen in consultation by Dr. Patrecia Pace did put in a Foley catheter.  Since decompensation of his bladder, he has had improvement in his hydronephrosis and improvement in his renal function.  The patient returned to hospital on the day of admission complaining of chest pain and chest discomfort.  He also was having persistent lower abdominal pain and discomfort.  He was readmitted to the hospital rule out for MI as well as to continue to follow up on his acute renal insufficiency and anemia.  Please see the H and P and EMR as well  as previous EMR records for past medical history, family history, social history, admission examination.  HOSPITAL COURSE: 1. Cardiac.  The patient had cardiac enzymes that were negative x3.     EKG was unremarkable.  A 2D echo was ordered and results are     pending in regards to cardiac function.  He previously did have a     decreased ejection fraction of 45%, although the etiology of his     cardiomyopathy had never been established.  He has had no cardiac     events that I am aware of.  The patient is doing well.  He is     hemodynamically stable.  He is asymptomatic.  Chest pain has     resolved.  At this point, he is on an adequate medical regimen.   We     will discharge the patient despite not having 2D echo back and we     will follow up as an outpatient with any additional medical     management as needed to maximize cardiac performance.  2. GU.  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  He has done well with Foley catheter and bladder     decompensation.  He has not had a further evaluation by Urology and     had difficulty getting into the office due to financial issues.  At     this point, the patient is stable and can be discharged.  We will     exchange out his Foley catheter given that this current catheter     has been in place for greater than 3 weeks.  We will endeavor to     have him seen by Urology for further evaluation of potential     prostate disease.  At a minimum, I believe he is going to require     TUR, although he may have significant prostate cancer.  We will try     to treat him medically and we will continue his present regimen     that includes tamsulosin.  3. Anemia.  The patient's iron panel revealed he had normal iron     levels.  His iron saturation was normal.  Folate was normal.  B12     level was normal.  Erythropoietin level was normal as well.     Currently, his anemia is unexplained, considered to be anemia of     chronic disease.  His final hemoglobin was 9.6 g.  We will follow     this up as an outpatient.  4. Chronic kidney disease.  The patient at time of his first admission     had a creatinine that was 4.9.  With bladder decompensation and     relief of pressure, his hydronephrosis is improved by ultrasound     and his creatinine has continued to improve.  The patient's last     creatinine on December 06, 2011, was 2.92.  We will obtain a final basic     metabolic panel prior to discharge.  We will follow this up as an     outpatient.  SUMMARY:  This is a very pleasant gentleman with unfortunate financial situation, making it difficult for him to obtain  outpatient medical care.  He does have very large prostate and very possibly prostate cancer, which will need to be attended to.  He does have urinary retention and requiring a Foley catheter long-term.  This  will be continued at time of discharge until he can be seen by Urology.  The patient will be converted to all generic medications as best as possible and we will endeavor to provide him with samples if he is unable to obtain medication.  DISCHARGE MEDICATIONS: 1. Amlodipine 5 mg daily. 2. Aspirin 81 mg daily. 3. Proscar 5 mg daily. 4. Hydralazine 25 mg t.i.d. 5. Labetalol 200 mg b.i.d. 6. Tamsulosin 0.8 mg once daily.  DISPOSITION:  The patient will be discharged to home.  He will be seen in the office for follow up in 7 days.  We will work to get him in to see Dr. Brunilda Payor as an outpatient.  The patient's condition at time of discharge dictation is medically stable, but guarded given his multiple medical problems and difficulty with finances.     Rosalyn Gess Reshunda Strider, MD     MEN/MEDQ  D:  12/08/2011  T:  12/08/2011  Job:  [id number]  cc:   Lindaann Slough, M.D.

## 2011-12-08 NOTE — Plan of Care (Signed)
Problem: Phase III Progression Outcomes Goal: Foley discontinued Outcome: Not Applicable Date Met:  12/08/11 Patient going home with foley

## 2011-12-08 NOTE — Plan of Care (Signed)
Problem: Phase I Progression Outcomes Goal: Voiding-avoid urinary catheter unless indicated Outcome: Not Met (add Reason) Patient going home with foley cath

## 2011-12-08 NOTE — Progress Notes (Signed)
Subjective: Await reporting out of 2 D echo done July 2. Working to arrange GU follow-up.  Objective: Lab: EPO pending Imaging: No new imaging - 2 D echo pending  Physical Exam: Filed Vitals:   12/08/11 0459  BP: 147/88  Pulse: 68  Temp: 98.3 F (36.8 C)  Resp: 19       Assessment/Plan: Problems 1-4 are stable He is ready for d/c,. He will need a change out of foley catheter prior to d/c. He will be seen in 1 week. Will work to arrange GU follow-up.   Dictated # [id number]  Illene Regulus 12/08/2011, 7:41 AM

## 2011-12-14 ENCOUNTER — Telehealth: Payer: Self-pay | Admitting: Internal Medicine

## 2011-12-14 NOTE — Telephone Encounter (Signed)
Message copied by Newell Coral on Tue Dec 14, 2011  2:19 PM ------      Message from: Illene Regulus E      Created: Wed Dec 08, 2011  8:21 AM       Needs follow up in 7 days

## 2011-12-14 NOTE — Telephone Encounter (Signed)
I have tried calling the patient several times [on his work number] ( July 3rd @ 10:18am, July 5th @ 11:10am, July 8th @ 3:30pm, and today July 9th @ 2:20pm).  I have also spoken with his emergency contact, his uncle, and he is unable to provide an alternate number for the pt.  I will keep trying in hopes of reaching the pt.

## 2011-12-14 NOTE — Telephone Encounter (Signed)
Thanks for the effort. He is no longer working. He is staying with family/friends. Might check with the hospital to see if they have a working contact number for him.  Thanks agin.

## 2011-12-14 NOTE — Telephone Encounter (Signed)
Called the hospital and spoke with Medical Records.  They stated the only number he gave while he was in the hospital was [phone]".    i will keep trying! Maybe give his uncle a day and see if he will give [place] more information...   Thanks!

## 2011-12-28 ENCOUNTER — Other Ambulatory Visit (INDEPENDENT_AMBULATORY_CARE_PROVIDER_SITE_OTHER): Payer: Self-pay

## 2011-12-28 ENCOUNTER — Ambulatory Visit (INDEPENDENT_AMBULATORY_CARE_PROVIDER_SITE_OTHER): Payer: Self-pay | Admitting: Internal Medicine

## 2011-12-28 ENCOUNTER — Encounter: Payer: Self-pay | Admitting: Internal Medicine

## 2011-12-28 DIAGNOSIS — N179 Acute kidney failure, unspecified: Secondary | ICD-10-CM

## 2011-12-28 DIAGNOSIS — N1330 Unspecified hydronephrosis: Secondary | ICD-10-CM

## 2011-12-28 DIAGNOSIS — N400 Enlarged prostate without lower urinary tract symptoms: Secondary | ICD-10-CM

## 2011-12-28 DIAGNOSIS — I119 Hypertensive heart disease without heart failure: Secondary | ICD-10-CM

## 2011-12-28 DIAGNOSIS — D649 Anemia, unspecified: Secondary | ICD-10-CM

## 2011-12-28 DIAGNOSIS — D693 Immune thrombocytopenic purpura: Secondary | ICD-10-CM

## 2011-12-28 DIAGNOSIS — I43 Cardiomyopathy in diseases classified elsewhere: Secondary | ICD-10-CM | POA: Insufficient documentation

## 2011-12-28 DIAGNOSIS — I428 Other cardiomyopathies: Secondary | ICD-10-CM

## 2011-12-28 DIAGNOSIS — I10 Essential (primary) hypertension: Secondary | ICD-10-CM

## 2011-12-28 LAB — COMPREHENSIVE METABOLIC PANEL
ALT: 20 U/L (ref 0–53)
AST: 21 U/L (ref 0–37)
Albumin: 3.6 g/dL (ref 3.5–5.2)
BUN: 61 mg/dL — ABNORMAL HIGH (ref 6–23)
Calcium: 8.8 mg/dL (ref 8.4–10.5)
Chloride: 97 mEq/L (ref 96–112)
Potassium: 4 mEq/L (ref 3.5–5.1)

## 2011-12-28 LAB — HEMOGLOBIN AND HEMATOCRIT, BLOOD: HCT: 30.7 % — ABNORMAL LOW (ref 39.0–52.0)

## 2011-12-28 MED ORDER — AMLODIPINE BESYLATE 5 MG PO TABS: 5.0000 mg | ORAL_TABLET | Freq: Every day | ORAL | Status: DC

## 2011-12-28 MED ORDER — TAMSULOSIN HCL 0.4 MG PO CAPS: 0.8000 mg | ORAL_CAPSULE | Freq: Every day | ORAL | Status: DC

## 2011-12-28 MED ORDER — LABETALOL HCL 200 MG PO TABS: 200.0000 mg | ORAL_TABLET | Freq: Two times a day (BID) | ORAL | Status: DC

## 2011-12-28 NOTE — Assessment & Plan Note (Signed)
Last 2 D echo July '13 tudy Conclusions  - Left ventricle: The cavity size was normal. Wall thickness was increased in a pattern of mild LVH. Systolic function was normal. The estimated ejection fraction was in the range of 50% to 55%. Wall motion was normal; there were no regional wall motion abnormalities. Doppler parameters are consistent with abnormal left ventricular relaxation (grade 1 diastolic dysfunction). - Aortic valve: Trileaflet; mildly thickened leaflets with nodular sclerosis. Mild to moderate regurgitation. - Mitral valve: Mild regurgitation. - Left atrium: The atrium was mildly dilated. - Tricuspid valve: Mild regurgitation. - Pulmonic valve: Mild regurgitation. - Pericardium, extracardiac: A trivial pericardial effusion was identified posterior to the heart.  This is improved from previous study with EF 45%  Plan Tight blood pressure control.

## 2011-12-28 NOTE — Assessment & Plan Note (Signed)
BP Readings from Last 3 Encounters:  12/08/11 141/83  11/26/11 157/99  03/30/10 156/116   Currently he does not have all his medications and does not have $$ to purchase them all.  Plan - refer to project access and MAP program

## 2011-12-28 NOTE — Progress Notes (Signed)
Subjective:    Patient ID: [name], male    DOB: 06/24/1949, 61 y.o.   MRN: [id number]  HPI Mr. Olgin presents for hospital follow -up. D/C summary reviewed along with labs. He was admitted with c/p and ruled out for MI. He also had obstruction renal failure that had been improving with placement of Foley catheter. Origin of his problem appears to be a very large prostate with pressure on bladder leading to retention leading to obstructive hydronephrosis and failure. At June adm creatinine was 5, at last discharge creatinine was 3. He still has a foley in place, has seen Dr. Hyman Hopes and evidently had a recurrent rise in creatinine. He was also diagnosed with infection but has been unable to fill Rx due to $$ hardship. He does have a follow-up appointment with Dr. Brunilda Payor July 26th and will have follow-up lab today.  Since d/c he has been feeling ok. He is staying with his children, alternating between his son or his daughter's home. He has food. He has not been able to afford all his medications.   Past Medical History  Diagnosis Date  . Hypertension   . Dysuria   . UTI (urinary tract infection)   . Tuberculosis     h/o PPD +  . BPH (benign prostatic hyperplasia)   . Anemia     normal Fe, nl B12, nl retic, nl EPO July '13  . Chronic kidney disease     CKD III, obstructive nephropathy  . Elevated PSA, greater than or equal to 20 ng/ml June '13    PSA 107   Past Surgical History  Procedure Date  . Spleenectomy    Family History  Problem Relation Age of Onset  . Diabetes Mother   . Stroke Brother   . Hearing loss Brother    History   Social History  . Marital Status: Married    Spouse Name: N/A    Number of Children: 2  . Years of Education: 20   Occupational History  . 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 Smoking status: Former Smoker    Types: Cigarettes  . Smokeless tobacco: Never Used  . Alcohol Use: No  . Drug Use: No  .  Sexually Active: No   Other Topics Concern  . Not on file   Social History Narrative   Native of [place], raised poor farming community. attendedd gymnasium. To [place] '78 - finished College, all but Occupational hygienist. Married - wife in [place], blocked from immigrating to [place]. 1 son '90; 1 dtr '93. 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 Resources depleted.       Review of Systems  Constitutional: Positive for unexpected weight change. Negative for chills, activity change and appetite change.  HENT: Positive for facial swelling. Negative for ear pain, congestion, rhinorrhea, neck pain and neck stiffness.   Eyes: Negative.   Respiratory: Negative.   Cardiovascular: Negative.   Gastrointestinal: Negative.   Genitourinary: Positive for hematuria, decreased urine volume and difficulty urinating.       Obstructed - has foley catheter  Musculoskeletal: Positive for back pain.  Skin: Negative.   Neurological: Negative.   Hematological: Negative.   Psychiatric/Behavioral: Negative.        Objective:   Physical Exam There were no vitals filed for this visit. Wt Readings from Last 3 Encounters:  12/08/11 193 lb 9 oz (87.8 kg)  11/26/11 200 lb 3.2 oz (90.81  kg)  01/29/10 264 lb 12.8 oz (120.112 kg)   Gen'l- tall African man who appears thin - obvious weight loss HEENT- C&S clear, small soft tissue mass at angle of jaw right Cor- 2+ radial RRR PUlm - normal respirations Neuro - A&O x 3, Cn II-XII normal, MS 5/5, cerebellar - using a cane.no tremor       Assessment & Plan:  Urinary track infection - Plan Provided samples of Avalox 400 mg 1 qD x 5 days  Financial issues - patient is uninsured and indigent Plan - refer back to project access - PCP will be Damar Petit

## 2011-12-28 NOTE — Assessment & Plan Note (Signed)
Long h/o BPH now with very large prostate with PSA 107. Renal U/S June 21 '13: IMPRESSION:  1. Improved right hydronephrosis.  2. Perhaps slight improvement in left hydronephrosis.  3. Lobulated soft tissue mass in the bladder with marked  thickening of the bladder wall. I suspect this is originating from  the prostate gland.  CT pelvix June 19, '13: IMPRESSION:  1. Markedly enlarged prostate gland protruding into the urinary  bladder. This is concerning for the possibility of a prostate  neoplasm. A bladder mass is less likely based on the current  noncontrasted appearance.  2. Moderate bilateral hydronephrosis.  3. Nonspecific liver masses.  4. Minimal bilateral pleural fluid.  5. Minimal bilateral lower lobe atelectasis.  6. Small supraumbilical ventral hernia containing peritoneal  Fluid.  Concern for either massive prostate or possible prostate cancer with PSA 107  Plan Follow-up with Dr. Brunilda Payor July 26th

## 2011-12-28 NOTE — Assessment & Plan Note (Signed)
Stable plt count, no purpura. S/p splenectomy

## 2011-12-28 NOTE — Assessment & Plan Note (Signed)
Acute renal failure with creatinine to 5 due to obstructive nephropathy. Showed improvement with placement of foley catheter and drainage. Follows with Dr. Hyman Hopes.  Plan Per Dr. Hyman Hopes  F/u Bmet today  Addendum: BMET    Component Value Date/Time   NA 137 12/28/2011 1404   K 4.0 12/28/2011 1404   CL 97 12/28/2011 1404   CO2 29 12/28/2011 1404   GLUCOSE 102* 12/28/2011 1404   BUN 61* 12/28/2011 1404   CREATININE 3.5* 12/28/2011 1404   CALCIUM 8.8 12/28/2011 1404   GFRNONAA 22* 12/08/2011 0923   GFRAA 25* 12/08/2011 1610

## 2011-12-28 NOTE — Assessment & Plan Note (Signed)
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 As of July 23, '13 has had indwelling foley for 6 weeks.

## 2011-12-28 NOTE — Assessment & Plan Note (Signed)
Developed profound anemia in [place] '13: required hospitalization and transfusion. Eval July '13: normal iron studies, normal B12, normal EPO, nl retic count. Probable anemia of chronic disease.  Plan F/u H/H today  May need hematology consult  Addendum: Hgb 10.3

## 2011-12-29 ENCOUNTER — Other Ambulatory Visit: Payer: Self-pay | Admitting: *Deleted

## 2011-12-29 NOTE — Telephone Encounter (Signed)
REFILL ON NORVASC AND LABETALOL SENT TO [email or web address]

## 2012-01-02 ENCOUNTER — Encounter: Payer: Self-pay | Admitting: Internal Medicine

## 2012-01-03 ENCOUNTER — Telehealth: Payer: Self-pay | Admitting: *Deleted

## 2012-01-03 ENCOUNTER — Encounter: Payer: Self-pay | Admitting: *Deleted

## 2012-01-03 NOTE — Telephone Encounter (Signed)
Lab results faxed and mailed to [facility] To Dr. Elvis Coil

## 2012-01-03 NOTE — Telephone Encounter (Signed)
Message copied by Elnora Morrison on Mon Jan 03, 2012  1:26 PM ------      Message from: Jacques Navy      Created: Sun Jan 02, 2012 10:07 PM       Letter to pt. Send copy of labs to Dr. Hyman Hopes

## 2012-02-08 ENCOUNTER — Other Ambulatory Visit: Payer: Self-pay | Admitting: *Deleted

## 2012-02-08 MED ORDER — GABAPENTIN 100 MG PO CAPS: 100.0000 mg | ORAL_CAPSULE | Freq: Two times a day (BID) | ORAL | Status: DC

## 2012-02-25 ENCOUNTER — Emergency Department (HOSPITAL_COMMUNITY)
Admission: EM | Admit: 2012-02-25 | Discharge: 2012-02-25 | Disposition: A | Discharge status: Home / Self Care | Payer: Medicaid Other | Attending: Emergency Medicine | Admitting: Emergency Medicine

## 2012-02-25 DIAGNOSIS — Z7982 Long term (current) use of aspirin: Secondary | ICD-10-CM | POA: Diagnosis not present

## 2012-02-25 DIAGNOSIS — I129 Hypertensive chronic kidney disease with stage 1 through stage 4 chronic kidney disease, or unspecified chronic kidney disease: Secondary | ICD-10-CM | POA: Diagnosis not present

## 2012-02-25 DIAGNOSIS — R319 Hematuria, unspecified: Secondary | ICD-10-CM | POA: Diagnosis present

## 2012-02-25 DIAGNOSIS — N183 Chronic kidney disease, stage 3 unspecified: Secondary | ICD-10-CM | POA: Insufficient documentation

## 2012-02-25 DIAGNOSIS — Z79899 Other long term (current) drug therapy: Secondary | ICD-10-CM | POA: Diagnosis not present

## 2012-02-25 LAB — CBC WITH DIFFERENTIAL/PLATELET
Basophils Absolute: 0.1 10*3/uL (ref 0.0–0.1)
Basophils Relative: 1 % (ref 0–1)
Eosinophils Absolute: 0.2 10*3/uL (ref 0.0–0.7)
Eosinophils Relative: 3 % (ref 0–5)
HCT: 33.8 % — ABNORMAL LOW (ref 39.0–52.0)
MCH: 28.8 pg (ref 26.0–34.0)
MCHC: 32.8 g/dL (ref 30.0–36.0)
MCV: 87.8 fL (ref 78.0–100.0)
Monocytes Absolute: 0.4 10*3/uL (ref 0.1–1.0)
RDW: 13.5 % (ref 11.5–15.5)

## 2012-02-25 LAB — URINALYSIS, ROUTINE W REFLEX MICROSCOPIC
Glucose, UA: NEGATIVE mg/dL
Specific Gravity, Urine: 1.015 (ref 1.005–1.030)
Urobilinogen, UA: 0.2 mg/dL (ref 0.0–1.0)

## 2012-02-25 LAB — COMPREHENSIVE METABOLIC PANEL
AST: 20 U/L (ref 0–37)
Albumin: 3.2 g/dL — ABNORMAL LOW (ref 3.5–5.2)
Calcium: 8.7 mg/dL (ref 8.4–10.5)
Creatinine, Ser: 2.71 mg/dL — ABNORMAL HIGH (ref 0.50–1.35)

## 2012-02-25 LAB — URINE MICROSCOPIC-ADD ON

## 2012-02-25 MED ORDER — SODIUM CHLORIDE 0.9 % IV BOLUS (SEPSIS)
1000.0000 mL | Freq: Once | INTRAVENOUS | Status: AC
  Administered 2012-02-25: 1000 mL via INTRAVENOUS

## 2012-02-25 NOTE — ED Notes (Signed)
Per EMS, had biopsy of prostate yesterday-has foley, noticed blood around penis and in foley bag this am

## 2012-02-25 NOTE — ED Notes (Signed)
Pt c/o of pain at tip of penis where catheter is inserted. Pt had prostate biopsy yesterday, and is scheduled for a follow up on 9/26. Concerned this AM when awoke to find "small" amount of blood around tip of penis with dull 6/10. Scant amount of blood noted today at tip of penis. None observed in foley bag. Pt also complains of "shooting never" 8/10 pain to bilateral legs on the interior aspect radiating from groin to knees.

## 2012-02-25 NOTE — ED Notes (Signed)
Catheter flowing without difficulty-no blood noted-irrigate prn per MD

## 2012-02-25 NOTE — ED Notes (Signed)
ZOX:[id number]<AV> Expected date:02/25/12<BR> Expected time:12:50 PM<BR> Means of arrival:Ambulance<BR> Comments:<BR> 67yoM, blood in urine

## 2012-02-25 NOTE — ED Provider Notes (Signed)
History     CSN: [id number]  Arrival date & time 02/25/12  1317   First MD Initiated Contact with Patient 02/25/12 1324      Chief Complaint  Patient presents with  . Hematuria    (Consider location/radiation/quality/duration/timing/severity/associated sxs/prior treatment) HPI The patient presents to the ED with concerns of blood in his urine.  Notably, the patient had a prostate biopsy yesterday.  He notes that this morning he developed hematuria and blood around the base of the Foley catheter.  He continues to produce urine.  He denies associated fevers, chills, abdominal pain, lightheadedness, cough, dyspnea, chest pain or any other focal complaints.  Past Medical History  Diagnosis Date  . Hypertension   . Dysuria   . UTI (urinary tract infection)   . Tuberculosis     h/o PPD +  . BPH (benign prostatic hyperplasia)   . Anemia     normal Fe, nl B12, nl retic, nl EPO July '13  . Chronic kidney disease     CKD III, obstructive nephropathy  . Elevated PSA, greater than or equal to 20 ng/ml June '13    PSA 107    Past Surgical History  Procedure Date  . Spleenectomy     Family History  Problem Relation Age of Onset  . Diabetes Mother   . Stroke Brother   . Hearing loss Brother     History  Substance Use Topics  . Smoking status: Former Smoker    Types: Cigarettes  . Smokeless tobacco: Never Used  . Alcohol Use: No      Review of Systems  Constitutional:       Per HPI, otherwise negative  HENT:       Per HPI, otherwise negative  Eyes: Negative.   Respiratory:       Per HPI, otherwise negative  Cardiovascular:       Per HPI, otherwise negative  Gastrointestinal: Negative for vomiting.  Genitourinary:       Hpi   Musculoskeletal:       Per HPI, otherwise negative  Skin: Negative.   Neurological: Negative for syncope.    Allergies  Review of patient's allergies indicates no known allergies.  Home Medications   Current Outpatient Rx  Name  Route Sig Dispense Refill  . AMLODIPINE BESYLATE 5 MG PO TABS Oral Take 5 mg by mouth daily.    . ASPIRIN 81 MG PO TBEC Oral Take 81 mg by mouth daily.    Marland Kitchen FINASTERIDE 5 MG PO TABS Oral Take 5 mg by mouth daily.    Marland Kitchen GABAPENTIN 100 MG PO CAPS Oral Take 100 mg by mouth 2 (two) times daily.    Marland Kitchen LABETALOL HCL 200 MG PO TABS Oral Take 200 mg by mouth 2 (two) times daily.    Marland Kitchen LEVOFLOXACIN 500 MG PO TABS Oral Take 500-1,000 mg by mouth daily.     Marland Kitchen TAMSULOSIN HCL 0.4 MG PO CAPS Oral Take 0.8 mg by mouth daily after supper.      BP 157/96  Pulse 62  Temp 98 F (36.7 C) (Oral)  Resp 18  SpO2 99%  Physical Exam  Nursing note and vitals reviewed. Constitutional: He is oriented to person, place, and time. He appears well-developed. No distress.  HENT:  Head: Normocephalic and atraumatic.  Eyes: Conjunctivae normal and EOM are normal.  Cardiovascular: Normal rate and regular rhythm.   Pulmonary/Chest: Effort normal. No stridor. No respiratory distress.  Abdominal: He exhibits no distension.  Genitourinary:  Circumcised.       Foley catheter in place, draining yellow urine.  There is trace blood around the base of the meatus and about the proximal catheter  Musculoskeletal: He exhibits no edema.  Neurological: He is alert and oriented to person, place, and time.  Skin: Skin is warm and dry.  Psychiatric: He has a normal mood and affect.    ED Course  Procedures (including critical care time)   Labs Reviewed  CBC WITH DIFFERENTIAL  COMPREHENSIVE METABOLIC PANEL  URINALYSIS, ROUTINE W REFLEX MICROSCOPIC   No results found.   No diagnosis found.    MDM  This generally well-appearing male presents with concerns of hematuria, blood in his Foley catheter.  Notably, the patient had a prostate biopsy yesterday.  On exam the patient is in no distress with unremarkable vital signs.  The patient's labs are reassuring.  The patient's presentation is likely secondary to post procedural  effects.  Given the reassuring lab results from the absence of distress, stable vitals the patient was discharged in stable condition with urology followup as previously scheduled.    Gerhard Munch, MD 02/25/12 1517

## 2012-03-01 ENCOUNTER — Other Ambulatory Visit: Payer: Self-pay | Admitting: *Deleted

## 2012-03-01 MED ORDER — FINASTERIDE 5 MG PO TABS: 5.0000 mg | ORAL_TABLET | Freq: Every day | ORAL | Status: DC

## 2012-04-04 DIAGNOSIS — Z0279 Encounter for issue of other medical certificate: Secondary | ICD-10-CM

## 2012-04-08 ENCOUNTER — Other Ambulatory Visit (HOSPITAL_COMMUNITY): Payer: Self-pay | Admitting: Internal Medicine

## 2012-04-24 ENCOUNTER — Emergency Department (INDEPENDENT_AMBULATORY_CARE_PROVIDER_SITE_OTHER)
Admission: EM | Admit: 2012-04-24 | Discharge: 2012-04-24 | Disposition: A | Discharge status: Home / Self Care | Payer: Self-pay | Source: Home / Self Care | Attending: Emergency Medicine | Admitting: Emergency Medicine

## 2012-04-24 ENCOUNTER — Encounter (HOSPITAL_COMMUNITY): Payer: Self-pay | Admitting: *Deleted

## 2012-04-24 DIAGNOSIS — M47817 Spondylosis without myelopathy or radiculopathy, lumbosacral region: Secondary | ICD-10-CM

## 2012-04-24 MED ORDER — TRAMADOL-ACETAMINOPHEN 37.5-325 MG PO TABS: 1.0000 | ORAL_TABLET | Freq: Four times a day (QID) | ORAL | Status: DC | PRN

## 2012-04-24 MED ORDER — CYCLOBENZAPRINE HCL 10 MG PO TABS: 10.0000 mg | ORAL_TABLET | Freq: Two times a day (BID) | ORAL | Status: DC | PRN

## 2012-04-24 NOTE — ED Notes (Signed)
Pt reports generalized body aches especially in his knees and bottom of feet for the past year. Pt does not have regular md r/t insurance problems - referred to clinic here. Denies any known injury - walks assisted with cane

## 2012-04-24 NOTE — ED Provider Notes (Signed)
Medical screening examination/treatment/procedure(s) were performed by non-physician practitioner and as supervising physician I was immediately available for consultation/collaboration.  Leslee Home, M.D.   Reuben Likes, MD 04/24/12 [phone]

## 2012-04-24 NOTE — ED Provider Notes (Signed)
History     CSN: [id number]  Arrival date & time 04/24/12  0940   First MD Initiated Contact with Patient 04/24/12 1133      Chief Complaint  Patient presents with  . Generalized Body Aches    (Consider location/radiation/quality/duration/timing/severity/associated sxs/prior treatment) Patient is a 61 y.o. male presenting with back pain. The history is provided by the patient.  Back Pain  This is a chronic problem. The current episode started more than 2 days ago. The problem occurs daily. The problem has been gradually worsening. The pain is associated with no known injury. The pain is present in the sacro-iliac joint and lumbar spine. The quality of the pain is described as aching. The pain radiates to the right thigh. The pain is at a severity of 8/10. The symptoms are aggravated by bending, twisting and certain positions. The pain is the same all the time. Stiffness is present in the morning. Pertinent negatives include no numbness, no bowel incontinence, no perianal numbness, no tingling and no weakness. He has tried nothing for the symptoms. Risk factors: pmh of sciatica.  unemployed currently  Past Medical History  Diagnosis Date  . Hypertension   . Dysuria   . UTI (urinary tract infection)   . Tuberculosis     h/o PPD +  . BPH (benign prostatic hyperplasia)   . Anemia     normal Fe, nl B12, nl retic, nl EPO July '13  . Chronic kidney disease     CKD III, obstructive nephropathy  . Elevated PSA, greater than or equal to 20 ng/ml June '13    PSA 107    Past Surgical History  Procedure Date  . Spleenectomy     Family History  Problem Relation Age of Onset  . Diabetes Mother   . Stroke Brother   . Hearing loss Brother     History  Substance Use Topics  . Smoking status: Former Smoker    Types: Cigarettes  . Smokeless tobacco: Never Used  . Alcohol Use: No      Review of Systems  Gastrointestinal: Negative for bowel incontinence.  Musculoskeletal:  Positive for back pain.  Neurological: Negative for tingling, weakness and numbness.  All other systems reviewed and are negative.    Allergies  Review of patient's allergies indicates no known allergies.  Home Medications   Current Outpatient Rx  Name  Route  Sig  Dispense  Refill  . AMLODIPINE BESYLATE 5 MG PO TABS   Oral   Take 5 mg by mouth daily.         . ASPIRIN 81 MG PO TBEC   Oral   Take 81 mg by mouth daily.         Marland Kitchen FINASTERIDE 5 MG PO TABS   Oral   Take 1 tablet (5 mg total) by mouth daily.   90 tablet   3   . LABETALOL HCL 200 MG PO TABS   Oral   Take 200 mg by mouth 2 (two) times daily.         Marland Kitchen TAMSULOSIN HCL 0.4 MG PO CAPS   Oral   Take 0.8 mg by mouth daily after supper.         . CYCLOBENZAPRINE HCL 10 MG PO TABS   Oral   Take 1 tablet (10 mg total) by mouth 2 (two) times daily as needed for muscle spasms.   20 tablet   0   . GABAPENTIN 100 MG PO CAPS  Oral   Take 100 mg by mouth 2 (two) times daily.         . TRAMADOL-ACETAMINOPHEN 37.5-325 MG PO TABS   Oral   Take 1 tablet by mouth every 6 (six) hours as needed for pain.   60 tablet   1     BP 141/95  Pulse 63  Temp 97.9 F (36.6 C) (Oral)  Resp 18  SpO2 98%  Physical Exam  Nursing note and vitals reviewed. Constitutional: He is oriented to person, place, and time. Vital signs are normal. He appears well-developed and well-nourished. He is active and cooperative.  HENT:  Head: Normocephalic.  Eyes: Conjunctivae normal are normal. Pupils are equal, round, and reactive to light. No scleral icterus.  Neck: Trachea normal and normal range of motion. Neck supple.  Cardiovascular: Normal rate, regular rhythm, normal heart sounds and intact distal pulses.   Pulmonary/Chest: Effort normal and breath sounds normal.  Musculoskeletal:       Cervical back: Normal.       Thoracic back: Normal.       Lumbar back: He exhibits tenderness. He exhibits normal range of motion, no  bony tenderness, no swelling, no edema, no deformity, no laceration, no pain, no spasm and normal pulse.       Back:  Neurological: He is alert and oriented to person, place, and time. No cranial nerve deficit or sensory deficit.  Skin: Skin is warm and dry.  Psychiatric: He has a normal mood and affect. His speech is normal and behavior is normal. Judgment and thought content normal. Cognition and memory are normal.    ED Course  Procedures (including critical care time)  Labs Reviewed - No data to display No results found.   1. Lumbar and sacral arthritis       MDM  ultracet and flexeril.  Follow up with primary care provider.       Johnsie Kindred, NP 04/24/12 1151

## 2012-05-02 ENCOUNTER — Ambulatory Visit (INDEPENDENT_AMBULATORY_CARE_PROVIDER_SITE_OTHER): Payer: Self-pay | Admitting: Internal Medicine

## 2012-05-02 ENCOUNTER — Encounter: Payer: Self-pay | Admitting: Internal Medicine

## 2012-05-02 VITALS — BP 138/94 | HR 80 | Temp 97.1°F | Resp 10 | Wt 230.1 lb

## 2012-05-02 DIAGNOSIS — N179 Acute kidney failure, unspecified: Secondary | ICD-10-CM

## 2012-05-02 DIAGNOSIS — M159 Polyosteoarthritis, unspecified: Secondary | ICD-10-CM

## 2012-05-02 DIAGNOSIS — I10 Essential (primary) hypertension: Secondary | ICD-10-CM

## 2012-05-02 DIAGNOSIS — D649 Anemia, unspecified: Secondary | ICD-10-CM

## 2012-05-02 DIAGNOSIS — N400 Enlarged prostate without lower urinary tract symptoms: Secondary | ICD-10-CM

## 2012-05-02 NOTE — Patient Instructions (Addendum)
Thank you for coming to see [place]. We hope that we can help with your arthritis pains. We do not think that x-rays and other imaging studies will be helpful at this point because we do not think that this problem warrants a surgical or injection intervention.  Recommendations:  1. Acetaminophen (Tylenol)  **Take 1000 mg 3 times per day  2. Heat pads, massage  **Apply heat and massage affected areas  3. Bengay or other creams  **Apply to affected areas  4. Back exercises and core strengthening  **YouTube.com : search for 'Neck pain and stretch' or 'Neck pain and exercise' and 'Low back pain and stretch'  5. Once you can afford the prescriptions you were given at Urgent Care last week, these will also help.

## 2012-05-02 NOTE — Progress Notes (Signed)
Patient ID: [name], male   DOB: 06/24/1949, 61 y.o.   MRN: [id number]  [name] is a very pleasant 61 yo man from [place] who presents with arthritis and urinary problems. The arthritis is present in his neck, back, knees and feet. He describes it as a banding sensation of the legs and feet. His neck is quite stiff at present. He is not taking anything for it. He was given prescriptions for Flexeril and Ultracet from [facility] last week but has not filled them yet due to finances. He gets particularly stiff after sitting for awhile or with walking. There is not a particular time of day that is worse.   He was hospitalized in [place] in August 2012 for anemia and reports that he has been sick since then. He received 3 pints of blood transfusion while in the hospital. Prior to the hospitalization, he noted blood in his urine. They thought it might be malaria and gave him anti-malarial drugs. He also noted difficulty urinating that started at this time. He came back to the [place] in May of this year. He was hospitalized at [facility] for 5 days in June 2013 and was found to have a massive prostate gland causing an obstructive uropathy and renal failure. He was treated with a foley catheter which relieved the hydronephrosis with subsequent improvement in renal function. He has been seeing a urologist at [facility]. He takes tamsulosin 0.4 mg and finasteride 5 mg daily for his urinary symptoms. At some point, when he has insurance, he is a candidate for surgical intervention. He has been seeing Dr. Hyman Hopes for nephrology and has been relatively stable.   He reports continued problems with urination. He cannot urinate on his own and uses a catheter. He has it changed every 6 weeks.   ROS -Denies fever, chills, night sweats -Denies cough +Weight change (lost 70 lbs unintentionally last year, has gained 30 lbs back) +Weakness, fatigue +Chest pain (slight,  occasional)  Past Medical History  Diagnosis Date  . Hypertension   . Dysuria   . UTI (urinary tract infection)   . Tuberculosis     h/o PPD +  . BPH (benign prostatic hyperplasia)   . Anemia     normal Fe, nl B12, nl retic, nl EPO July '13  . Chronic kidney disease     CKD III, obstructive nephropathy  . Elevated PSA, greater than or equal to 20 ng/ml June '13    PSA 107   Past Surgical History  Procedure Date  . Spleenectomy    Family History  Problem Relation Age of Onset  . Diabetes Mother   . Stroke Brother   . Hearing loss Brother    History   Social History  . Marital Status: Married    Spouse Name: N/A    Number of Children: 2  . Years of Education: 20   Occupational History  . 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 Smoking status: Former Smoker    Types: Cigarettes  . Smokeless tobacco: Never Used  . Alcohol Use: No  . Drug Use: No  . Sexually Active: No   Other Topics Concern  . Not on file   Social History Narrative   Native of [place], raised poor farming community. attendedd gymnasium. To [place] '78 - finished College, all but Occupational hygienist. Married - wife in [place], blocked from immigrating to [place]. 1 son '90; 1  dtr '93. 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 Resources depleted.   Current Outpatient Prescriptions on File Prior to Visit  Medication Sig Dispense Refill  . amLODipine (NORVASC) 5 MG tablet Take 5 mg by mouth daily.      Marland Kitchen aspirin 81 MG EC tablet Take 81 mg by mouth daily.      . cyclobenzaprine (FLEXERIL) 10 MG tablet Take 1 tablet (10 mg total) by mouth 2 (two) times daily as needed for muscle spasms.  20 tablet  0  . finasteride (PROSCAR) 5 MG tablet Take 1 tablet (5 mg total) by mouth daily.  90 tablet  3  . gabapentin (NEURONTIN) 100 MG capsule Take 100 mg by mouth 2 (two) times daily.      Marland Kitchen labetalol (NORMODYNE) 200 MG tablet Take 200 mg by mouth 2 (two) times  daily.      . Tamsulosin HCl (FLOMAX) 0.4 MG CAPS Take 0.8 mg by mouth daily after supper.      . traMADol-acetaminophen (ULTRACET) 37.5-325 MG per tablet Take 1 tablet by mouth every 6 (six) hours as needed for pain.  60 tablet  1   PE General: Pleasant man in NAD CV: Regular rate and rhythm, S1 and S2 present. Regular pulse. Pulm: Lungs clear to auscultation bilaterally Abdomen: Soft, nondistended. Tender in lower region. MSK: Gait limited by stiffness and pain. Limited active ROM on neck extension and rotation. Hip flexion to 90-100 degrees. No deformities of hand joints. Normal grip strength. Full passive ROM at shoulder, elbows, and knees. Grinding sound from R knee with extension. Neuro: Alert and appropriate. No focal deficits. Reflexes 2+.  A/P [name] is a very pleasant 61 yo man from [place] who presents with arthritis and urinary problems  # Arthritis - causing considerable pain and limited ROM in neck, back, knees and feet. Whole body aches. -Consistent with osteoarthritis. No signs of inflammation, joint deformity, synovial fluid. -Cannot take NSAIDs (e.g., Aleve, Advil) due to kidney function -Received prescriptions for Flexeril (cyclobenzaprine) and Ultracet (tramadol) last week, patient has not filled due to finances -Recommend Tylenol (acetaminophen)  # Obstructive uropathy - unable to urinate, using catheter -Seeing urologist Dr. Netta Corrigan; surgery possibility discussed per patient  # Anemia - was hospitalized for this problem in August 2012. Last check 1 month ago (03/27/12) Hgb 11.9, stable -Stable, continue to monitor  Patient interviewed, examined, labs and notes reviewed. Agree with assessment and plan per Ms. Zadie Deemer, MS III

## 2012-05-03 NOTE — Assessment & Plan Note (Signed)
#   Arthritis - causing considerable pain and limited ROM in neck, back, knees and feet. Whole body aches. Knee exam more c/w chondromalacia patella. -Consistent with osteoarthritis. No signs of inflammation, joint deformity, synovial fluid. -Cannot take NSAIDs (e.g., Aleve, Advil) due to kidney function  Plan: -Received prescriptions for Flexeril (cyclobenzaprine) and Ultracet (tramadol) last week, patient has not filled due to finances -Recommend Tylenol (acetaminophen) -refer to YouTube.com for exercise videos: neck and stretch; low back and stretch.

## 2012-05-03 NOTE — Assessment & Plan Note (Signed)
Continues to have Foley cathe

## 2012-05-03 NOTE — Assessment & Plan Note (Signed)
Improving. Follow by Dr. Hyman Hopes. Last Cr 2.48

## 2012-05-03 NOTE — Assessment & Plan Note (Signed)
BP Readings from Last 3 Encounters:  05/02/12 138/94  04/24/12 141/95  02/25/12 150/96   Good control on present medication.

## 2012-05-03 NOTE — Assessment & Plan Note (Signed)
#   Anemia - was hospitalized for this problem in August 2012. Last check 1 month ago (03/27/12) Hgb 11.9, stable -Stable, continue to monitor

## 2012-05-11 ENCOUNTER — Encounter (HOSPITAL_COMMUNITY): Payer: Self-pay | Admitting: *Deleted

## 2012-05-11 ENCOUNTER — Emergency Department (HOSPITAL_COMMUNITY): Payer: Medicaid Other

## 2012-05-11 ENCOUNTER — Emergency Department (HOSPITAL_COMMUNITY)
Admission: EM | Admit: 2012-05-11 | Discharge: 2012-05-12 | Disposition: A | Discharge status: Home / Self Care | Payer: Medicaid Other | Attending: Emergency Medicine | Admitting: Emergency Medicine

## 2012-05-11 DIAGNOSIS — R5383 Other fatigue: Secondary | ICD-10-CM | POA: Insufficient documentation

## 2012-05-11 DIAGNOSIS — Z862 Personal history of diseases of the blood and blood-forming organs and certain disorders involving the immune mechanism: Secondary | ICD-10-CM | POA: Insufficient documentation

## 2012-05-11 DIAGNOSIS — R5381 Other malaise: Secondary | ICD-10-CM | POA: Insufficient documentation

## 2012-05-11 DIAGNOSIS — Z87440 Personal history of urinary (tract) infections: Secondary | ICD-10-CM | POA: Diagnosis not present

## 2012-05-11 DIAGNOSIS — M549 Dorsalgia, unspecified: Secondary | ICD-10-CM | POA: Insufficient documentation

## 2012-05-11 DIAGNOSIS — IMO0002 Reserved for concepts with insufficient information to code with codable children: Secondary | ICD-10-CM | POA: Insufficient documentation

## 2012-05-11 DIAGNOSIS — I129 Hypertensive chronic kidney disease with stage 1 through stage 4 chronic kidney disease, or unspecified chronic kidney disease: Secondary | ICD-10-CM | POA: Diagnosis not present

## 2012-05-11 DIAGNOSIS — M5416 Radiculopathy, lumbar region: Secondary | ICD-10-CM

## 2012-05-11 DIAGNOSIS — R6883 Chills (without fever): Secondary | ICD-10-CM | POA: Insufficient documentation

## 2012-05-11 DIAGNOSIS — M1990 Unspecified osteoarthritis, unspecified site: Secondary | ICD-10-CM | POA: Diagnosis not present

## 2012-05-11 DIAGNOSIS — M4800 Spinal stenosis, site unspecified: Secondary | ICD-10-CM | POA: Insufficient documentation

## 2012-05-11 DIAGNOSIS — N400 Enlarged prostate without lower urinary tract symptoms: Secondary | ICD-10-CM | POA: Insufficient documentation

## 2012-05-11 DIAGNOSIS — Z87891 Personal history of nicotine dependence: Secondary | ICD-10-CM | POA: Diagnosis not present

## 2012-05-11 DIAGNOSIS — N179 Acute kidney failure, unspecified: Secondary | ICD-10-CM | POA: Diagnosis not present

## 2012-05-11 DIAGNOSIS — N183 Chronic kidney disease, stage 3 unspecified: Secondary | ICD-10-CM | POA: Insufficient documentation

## 2012-05-11 HISTORY — DX: Other chronic pain: G89.29

## 2012-05-11 HISTORY — DX: Dorsalgia, unspecified: M54.9

## 2012-05-11 MED ORDER — HYDROMORPHONE HCL PF 1 MG/ML IJ SOLN
1.0000 mg | Freq: Once | INTRAMUSCULAR | Status: AC
  Administered 2012-05-12: 1 mg via INTRAVENOUS
  Filled 2012-05-11: qty 1

## 2012-05-11 NOTE — ED Notes (Addendum)
Pt in c/o lower back pain that radiates down right leg, states he has a history of same but pain is increased today, pt states he has a prescription for pain medication for this but has been unable to purchase this medication. Pt states back pain is from an injury in 1993.

## 2012-05-12 ENCOUNTER — Emergency Department (HOSPITAL_COMMUNITY): Payer: Medicaid Other

## 2012-05-12 LAB — CBC WITH DIFFERENTIAL/PLATELET
Basophils Relative: 0 % (ref 0–1)
Eosinophils Absolute: 0.1 10*3/uL (ref 0.0–0.7)
Hemoglobin: 12.5 g/dL — ABNORMAL LOW (ref 13.0–17.0)
MCH: 29.4 pg (ref 26.0–34.0)
MCHC: 34.2 g/dL (ref 30.0–36.0)
Monocytes Relative: 9 % (ref 3–12)
Neutrophils Relative %: 60 % (ref 43–77)
Platelets: 242 10*3/uL (ref 150–400)
RDW: 13.3 % (ref 11.5–15.5)

## 2012-05-12 LAB — TYPE AND SCREEN
ABO/RH(D): O POS
Antibody Screen: POSITIVE

## 2012-05-12 LAB — URINALYSIS, ROUTINE W REFLEX MICROSCOPIC
Glucose, UA: NEGATIVE mg/dL
pH: 7 (ref 5.0–8.0)

## 2012-05-12 LAB — COMPREHENSIVE METABOLIC PANEL
Albumin: 3.3 g/dL — ABNORMAL LOW (ref 3.5–5.2)
Alkaline Phosphatase: 110 U/L (ref 39–117)
BUN: 33 mg/dL — ABNORMAL HIGH (ref 6–23)
Potassium: 3.7 mEq/L (ref 3.5–5.1)
Total Protein: 7.6 g/dL (ref 6.0–8.3)

## 2012-05-12 LAB — PROTIME-INR: Prothrombin Time: 13.9 seconds (ref 11.6–15.2)

## 2012-05-12 MED ORDER — OXYCODONE-ACETAMINOPHEN 5-325 MG PO TABS
1.0000 | ORAL_TABLET | Freq: Once | ORAL | Status: AC
  Administered 2012-05-12: 1 via ORAL
  Filled 2012-05-12: qty 1

## 2012-05-12 MED ORDER — OXYCODONE-ACETAMINOPHEN 5-325 MG PO TABS: 1.0000 | ORAL_TABLET | ORAL | Status: DC | PRN

## 2012-05-12 MED ORDER — PREDNISONE 20 MG PO TABS
60.0000 mg | ORAL_TABLET | Freq: Once | ORAL | Status: AC
  Administered 2012-05-12: 60 mg via ORAL
  Filled 2012-05-12: qty 3

## 2012-05-12 MED ORDER — PREDNISONE 10 MG PO TABS: ORAL_TABLET | ORAL | Status: DC

## 2012-05-12 NOTE — ED Provider Notes (Signed)
[name] is a 61 y.o. male was being evaluated for back pain. The pain radiates down the right leg. An MRI was done to evaluate the pain. Patient reports his pain is progressive, and that for the last 3 days he is in constant pain. He, states that he cannot move his right leg due to the pain and is unable to climb steps when he attempts to do that. He was treated at an urgent care and prescribed medicines. He, states that he has no funds to buy medicines. He was not taking any medicine at home.    Dg Chest 2 View  05/12/2012  *RADIOLOGY REPORT*  Clinical Data: Severe back pain radiating to right leg.  CHEST - 2 VIEW  Comparison: 12/04/2011  Findings: Mild to moderate cardiomegaly stable as well as ectasia of the thoracic aorta.  No evidence of acute infiltrate or edema. No evidence of pleural effusion.  No mass or lymphadenopathy identified.  Surgical clips are seen in the left upper quadrant the abdomen.  IMPRESSION: Stable cardiomegaly and ectatic aorta.  No active lung disease.   Original Report Authenticated By: Myles Rosenthal, M.D.    Mr Lumbar Spine Wo Contrast  05/12/2012  *RADIOLOGY REPORT*  Clinical Data: Right-sided low back pain and right buttock and leg pain.  Weakness.  MRI LUMBAR SPINE WITHOUT CONTRAST  Technique:  Multiplanar and multiecho pulse sequences of the lumbar spine were obtained without intravenous contrast.  Comparison: CT scan of the abdomen/ pelvis dated 11/24/2011  Findings: The tip of the conus is at L1 and appears normal.  Both kidneys are atrophic with several small renal cysts.  T12-L1 through L2-3:  Normal.  L3-4:  Small far lateral bulge of the right lateral to the neural foramen without neural impingement.  Slight bilateral facet arthritis.  L4-5:  Disc degeneration with degenerative changes of the endplates with a small far lateral disc protrusion and lateral to the right neural foramen adjacent to but not compressing the right L4 nerve. Hypertrophy of the facet joints  and ligamenta flava causes severe bilateral lateral recess stenosis and moderately severe spinal stenosis.  The L5 nerves could be affected by the lateral recess stenosis.  L5 S1:  The disc is normal.  Minimal degenerative changes of the facet joints.  IMPRESSION:  1.  Moderately severe spinal stenosis at L4-5 with severe bilateral lateral recess stenosis. 2.  Small right far lateral disc protrusion at L4-5 without impingement upon the exiting L4 nerve.   Original Report Authenticated By: Francene Boyers, M.D.     The case was discussed with Dr. Jordan Likes, neurosurgeon. He viewed the patient's images. He suggested that patient be treated with a prednisone taper pack and pain medicines. He will see the patient in followup, in the office.   Diagnoses that have been ruled out:  None  Diagnoses that are still under consideration:  None  Final diagnoses:  Back pain  Lumbar radiculopathy  Spinal stenosis  Degenerative joint disease     Plan: Home Medications- Percocet and prednisone; Home Treatments- rest; Recommended follow up- neurosurgery followup one week    Flint Melter, MD 05/12/12 1232

## 2012-05-12 NOTE — ED Notes (Signed)
Patient transported back from MRI. 

## 2012-05-12 NOTE — ED Provider Notes (Signed)
61 year old male with a history of chronic back pain mostly on the right side who states that over the last couple of days he has had an increased in severity of the pain and has difficulty walking secondary to severe pain and some weakness in the right lower extremity. He is unsure if he has any urinary difficulties because he has an indwelling Foley catheter secondary to prostatic hypertrophy. He denies a history of cancer, IV drug use and generally does not have any weakness or numbness in his legs. On exam the patient is a soft abdomen, clear heart and lung sounds, no murmur, normal sensation to light touch and pinprick of the bilateral lower extremities from the hips to the toes. Has normal reflexes at the quadriceps with patellar tendon. He has the ability to dorsiflex and plantar flex the right foot, he is able to extend and flex the right knee though this has some weakness that the patient relates to the increased pain with that movement. He is reducible tenderness to the lower back on the right side. He does not have a spinal tenderness.  Assessment the patient has severe increase in pain, he relates having some weakness in the right leg though it is unclear whether this is related to spinal cord impingement or 2 the pain is experiencing. He is able to do all the movements I ask him to do though he is ginger about this.  He has no fevers, no wrist factors for severe disease other than his age, will obtain MRI in the morning, pain medication given  Medical screening examination/treatment/procedure(s) were conducted as a shared visit with non-physician practitioner(s) and myself.  I personally evaluated the patient during the encounter    Vida Roller, MD 05/12/12 [phone]

## 2012-05-12 NOTE — Progress Notes (Signed)
  CARE MANAGEMENT ED NOTE 05/12/2012  Patient:  [name], [name]   Account Number:  [id number]  Date Initiated:  05/12/2012  Documentation initiated by:  Edd Arbour  Subjective/Objective Assessment:   61 yr old self pay pt with 3 ED visits & 2 admissions in last 6 months c/o back pain radiating down legs EDP discussed with Dr. Jordan Likes, neurosurgeon. He viewed the pt's images. He suggested that pt be treated with a prednisone taper     Subjective/Objective Assessment Detail:   and to be f/u in his office CM consult from [place] for "insurance concerns" Pt voiced concern about his chs bills. Filled for disability and pending. He wanted "hospital to rush disability with DSS" Pt reports he does not have money to get his chs medical records to give to DSS Reports he wants insurance but can not afford confirmed pcp as dr Debby Bud     Action/Plan:   CM spoke with pt about cm consult. CM referred pt to financial counselor via contact number on his chs bill and ED registration staff at d/c to assist with payment plans for chs bill   Action/Plan Detail:   CM informed pt chs does not provided DSS information for disability unless he requests his medical record Encouraged pt to access his medical information via chs website offered at d/c ot assist with providing medical records to DSS.   Anticipated DC Date:  05/12/2012     Status Recommendation to Physician:   Result of Recommendation:    Other ED Services  Consult Working Plan   In-house referral  Financial Counselor   DC Planning Services  CM consult  PCP issues  Outpatient Services - Pt will follow up  Other  [facility] / [facility] (established/new)    Choice offered to / List presented to:            Status of service:  Completed, signed off  ED Comments:  Voiced understanding and appreciative of services and resources offered  ED Comments Detail:  CM also discussed and provided written information for self pay pcps, importance of pcp for f/u  care, www.needymeds.org, discounted pharmacies, and other [place] resources such as financial assistance, DSS and  health department Reviewed Health connect number to assist with finding self pay provider close to pt's residence. Reviewed resources for [facility], general medical clinics, [facility] out patient [facility], housing, and other resources in [employer]

## 2012-05-12 NOTE — ED Provider Notes (Signed)
  Medical screening examination/treatment/procedure(s) were conducted as a shared visit with non-physician practitioner(s) and myself.  I personally evaluated the patient during the encounter  Please see my separate respective documentation pertaining to this patient encounter   Change of shift, care signed out to oncoming physician  Vida Roller, MD 05/12/12 [phone]

## 2012-05-12 NOTE — ED Notes (Signed)
Pt down for MRI.

## 2012-05-12 NOTE — ED Provider Notes (Signed)
History     CSN: [id number]  Arrival date & time 05/11/12  2223   First MD Initiated Contact with Patient 05/11/12 2302      Chief Complaint  Patient presents with  . Back Pain    (Consider location/radiation/quality/duration/timing/severity/associated sxs/prior treatment) HPI Comments: 61 year old male with a history of chronic back pain presents to the emergency department with worsening back pain beginning last night. He has had back pain for many years since an accident in 1993. Patient states this morning he woke up he had severe sharp pain radiating from the right side of his back down through his buttock down his leg. States he feels weak and is unable to walk due to the pain. Last night he was unable to get comfortable since he normally lays on his right side. He has never had pain or weakness down his right leg as severe as this in the past. Denies loss of control of his bowels. Unsure whether he has control of his bladder due to an indwelling catheter for his BPH. Denies numbness or tingling into his leg. Also states that his lower right leg seemed swollen and warm. No history of blood clots. Patient was admitted to the hospital back in June for acute renal failure after traveling to and from his home country of [place]. Currently denies fever or night sweats. Admits to chills. No history of IV drug use cancer.  Patient is a 61 y.o. male presenting with back pain. The history is provided by the patient.  Back Pain  Associated symptoms include weakness. Pertinent negatives include no chest pain, no fever, no numbness and no headaches.    Past Medical History  Diagnosis Date  . Hypertension   . Dysuria   . UTI (urinary tract infection)   . Tuberculosis     h/o PPD +  . BPH (benign prostatic hyperplasia)   . Anemia     normal Fe, nl B12, nl retic, nl EPO July '13  . Chronic kidney disease     CKD III, obstructive nephropathy  . Elevated PSA, greater than or equal to 20 ng/ml  June '13    PSA 107  . Chronic back pain     Past Surgical History  Procedure Date  . Spleenectomy     Family History  Problem Relation Age of Onset  . Diabetes Mother   . Stroke Brother   . Hearing loss Brother     History  Substance Use Topics  . Smoking status: Former Smoker    Types: Cigarettes  . Smokeless tobacco: Never Used  . Alcohol Use: No      Review of Systems  Constitutional: Positive for chills and activity change. Negative for fever.  HENT: Negative for neck pain and neck stiffness.   Respiratory: Negative for shortness of breath.   Cardiovascular: Negative for chest pain.  Gastrointestinal: Negative for nausea and vomiting.  Genitourinary: Negative.   Musculoskeletal: Positive for back pain and gait problem.  Skin: Negative.   Neurological: Positive for weakness. Negative for dizziness, light-headedness, numbness and headaches.  Psychiatric/Behavioral: Negative for confusion.    Allergies  Review of patient's allergies indicates no known allergies.  Home Medications   Current Outpatient Rx  Name  Route  Sig  Dispense  Refill  . AMLODIPINE BESYLATE 5 MG PO TABS   Oral   Take 5 mg by mouth daily.         . ASPIRIN 81 MG PO TBEC   Oral  Take 81 mg by mouth daily.         . CYCLOBENZAPRINE HCL 10 MG PO TABS   Oral   Take 1 tablet (10 mg total) by mouth 2 (two) times daily as needed for muscle spasms.   20 tablet   0   . FINASTERIDE 5 MG PO TABS   Oral   Take 1 tablet (5 mg total) by mouth daily.   90 tablet   3   . GABAPENTIN 100 MG PO CAPS   Oral   Take 100 mg by mouth 2 (two) times daily.         Marland Kitchen LABETALOL HCL 200 MG PO TABS   Oral   Take 200 mg by mouth 2 (two) times daily.         Marland Kitchen TAMSULOSIN HCL 0.4 MG PO CAPS   Oral   Take 0.8 mg by mouth daily after supper.         . TRAMADOL-ACETAMINOPHEN 37.5-325 MG PO TABS   Oral   Take 1 tablet by mouth every 6 (six) hours as needed for pain.   60 tablet   1      BP 168/108  Pulse 88  Temp 98.5 F (36.9 C) (Oral)  Resp 20  SpO2 100%  Physical Exam  Constitutional: He is oriented to person, place, and time. He appears well-developed and well-nourished. No distress.       Uncomfortable sitting in exam chair.  HENT:  Head: Normocephalic and atraumatic.  Mouth/Throat: Oropharynx is clear and moist.  Eyes: Conjunctivae normal and EOM are normal. Pupils are equal, round, and reactive to light.  Neck: Normal range of motion. Neck supple.  Cardiovascular: Normal rate, regular rhythm, normal heart sounds and intact distal pulses.        Non pitting lower extremity edema, slightly more prominent on right than left.  Pulmonary/Chest: Effort normal and breath sounds normal. No respiratory distress.  Abdominal: Soft. Bowel sounds are normal. There is no tenderness.  Musculoskeletal:       Back:       No tenderness of lumbar spine. Marked tenderness of right side of back and into gluteal muscles.  Neurological: He is alert and oriented to person, place, and time. No cranial nerve deficit or sensory deficit.  Reflex Scores:      Patellar reflexes are 2+ on the right side and 2+ on the left side.      Sensation to light tough with soft and sharp objects intact lower extremities bilateral. Decreased strength of right lower extremity due to pain. Patient is able to dorsiflex and plantarflex his foot. Able to flex and extend both legs. Pain exacerbated by any movement.  Skin: Skin is warm and dry.       Mild warmth to right lower extremity compared to left.   Psychiatric: He has a normal mood and affect. His behavior is normal.    ED Course  Procedures (including critical care time)   Labs Reviewed  CBC WITH DIFFERENTIAL  COMPREHENSIVE METABOLIC PANEL  URINALYSIS, ROUTINE W REFLEX MICROSCOPIC  PROTIME-INR  TYPE AND SCREEN   Dg Chest 2 View  05/12/2012  *RADIOLOGY REPORT*  Clinical Data: Severe back pain radiating to right leg.  CHEST - 2 VIEW   Comparison: 12/04/2011  Findings: Mild to moderate cardiomegaly stable as well as ectasia of the thoracic aorta.  No evidence of acute infiltrate or edema. No evidence of pleural effusion.  No mass or lymphadenopathy identified.  Surgical  clips are seen in the left upper quadrant the abdomen.  IMPRESSION: Stable cardiomegaly and ectatic aorta.  No active lung disease.   Original Report Authenticated By: Myles Rosenthal, M.D.      No diagnosis found.    MDM  61 y/o male with worsening back pain since last night. Weakness in right leg could either be from spinal cord pathology or pain. Patient is uncomfortable. Pain medication given. He will obtain MRI in the morning. No concern for immediate need for MRI. No significant past medical history including IVDA or cancer. No fever. Case has been discussed with Dr. Hyacinth Meeker who also evaluated patient. He will monitor patient overnight until he obtains MRI in the morning.        Trevor Mace, PA-C 05/12/12 [phone]

## 2012-06-01 ENCOUNTER — Emergency Department (HOSPITAL_COMMUNITY)
Admission: EM | Admit: 2012-06-01 | Discharge: 2012-06-01 | Disposition: A | Discharge status: Home / Self Care | Payer: Medicaid Other | Attending: Emergency Medicine | Admitting: Emergency Medicine

## 2012-06-01 ENCOUNTER — Emergency Department (HOSPITAL_COMMUNITY): Payer: Medicaid Other

## 2012-06-01 ENCOUNTER — Encounter (HOSPITAL_COMMUNITY): Payer: Self-pay | Admitting: Emergency Medicine

## 2012-06-01 DIAGNOSIS — Z862 Personal history of diseases of the blood and blood-forming organs and certain disorders involving the immune mechanism: Secondary | ICD-10-CM | POA: Diagnosis not present

## 2012-06-01 DIAGNOSIS — J3489 Other specified disorders of nose and nasal sinuses: Secondary | ICD-10-CM | POA: Insufficient documentation

## 2012-06-01 DIAGNOSIS — I129 Hypertensive chronic kidney disease with stage 1 through stage 4 chronic kidney disease, or unspecified chronic kidney disease: Secondary | ICD-10-CM | POA: Diagnosis not present

## 2012-06-01 DIAGNOSIS — Z87891 Personal history of nicotine dependence: Secondary | ICD-10-CM | POA: Diagnosis not present

## 2012-06-01 DIAGNOSIS — Z79899 Other long term (current) drug therapy: Secondary | ICD-10-CM | POA: Diagnosis not present

## 2012-06-01 DIAGNOSIS — Z8619 Personal history of other infectious and parasitic diseases: Secondary | ICD-10-CM | POA: Diagnosis not present

## 2012-06-01 DIAGNOSIS — R51 Headache: Secondary | ICD-10-CM | POA: Diagnosis not present

## 2012-06-01 DIAGNOSIS — Z87440 Personal history of urinary (tract) infections: Secondary | ICD-10-CM | POA: Insufficient documentation

## 2012-06-01 DIAGNOSIS — J069 Acute upper respiratory infection, unspecified: Secondary | ICD-10-CM | POA: Diagnosis not present

## 2012-06-01 DIAGNOSIS — R0789 Other chest pain: Secondary | ICD-10-CM | POA: Insufficient documentation

## 2012-06-01 DIAGNOSIS — Z7982 Long term (current) use of aspirin: Secondary | ICD-10-CM | POA: Diagnosis not present

## 2012-06-01 DIAGNOSIS — R059 Cough, unspecified: Secondary | ICD-10-CM | POA: Insufficient documentation

## 2012-06-01 DIAGNOSIS — N189 Chronic kidney disease, unspecified: Secondary | ICD-10-CM | POA: Diagnosis not present

## 2012-06-01 DIAGNOSIS — R05 Cough: Secondary | ICD-10-CM | POA: Diagnosis not present

## 2012-06-01 MED ORDER — PREDNISONE 50 MG PO TABS: 50.0000 mg | ORAL_TABLET | Freq: Every day | ORAL | Status: DC

## 2012-06-01 MED ORDER — PROMETHAZINE-DM 6.25-15 MG/5ML PO SYRP: 5.0000 mL | ORAL_SOLUTION | Freq: Four times a day (QID) | ORAL | Status: DC | PRN

## 2012-06-01 MED ORDER — ALBUTEROL SULFATE HFA 108 (90 BASE) MCG/ACT IN AERS
INHALATION_SPRAY | RESPIRATORY_TRACT | Status: AC
  Filled 2012-06-01: qty 6.7

## 2012-06-01 MED ORDER — GUAIFENESIN ER 1200 MG PO TB12: 1.0000 | ORAL_TABLET | Freq: Two times a day (BID) | ORAL | Status: DC

## 2012-06-01 MED ORDER — ALBUTEROL SULFATE HFA 108 (90 BASE) MCG/ACT IN AERS
2.0000 | INHALATION_SPRAY | RESPIRATORY_TRACT | Status: DC | PRN
  Administered 2012-06-01: 2 via RESPIRATORY_TRACT

## 2012-06-01 NOTE — ED Provider Notes (Signed)
History   This chart was scribed for Donald Ridge, PA, by Leone Payor, ED Scribe. This patient was seen in room WTR7/WTR7 and the patient's care was started at 1243.   CSN: [id number]  Arrival date & time 06/01/12  1045   First MD Initiated Contact with Patient 06/01/12 1243      Chief Complaint  Patient presents with  . Cough    The history is provided by the patient. No language interpreter was used.   [name] is a 61 y.o. male who presents to the Emergency Department complaining of ongoing, unchanged, moderate cough productive of mucous starting 5 days ago. Pt has associated headache, nasal congestion, rhinorrhea, chest pain with cough. Pt denies taking any OTC medications. Pt denies having a sore throat, nausea, vomiting, ear pain, abdominal pain. Fever, or dizziness.   Pt has h/o HTN, chronic kidney disease.  Pt is a former smoker but denies alcohol use. Past Medical History  Diagnosis Date  . Hypertension   . Dysuria   . UTI (urinary tract infection)   . Tuberculosis     h/o PPD +  . BPH (benign prostatic hyperplasia)   . Anemia     normal Fe, nl B12, nl retic, nl EPO July '13  . Chronic kidney disease     CKD III, obstructive nephropathy  . Elevated PSA, greater than or equal to 20 ng/ml June '13    PSA 107  . Chronic back pain     Past Surgical History  Procedure Date  . Spleenectomy     Family History  Problem Relation Age of Onset  . Diabetes Mother   . Stroke Brother   . Hearing loss Brother     History  Substance Use Topics  . Smoking status: Former Smoker    Types: Cigarettes  . Smokeless tobacco: Never Used  . Alcohol Use: No      Review of Systems  All other systems negative except as documented in the HPI. All pertinent positives and negatives as reviewed in the HPI.  Allergies  Review of patient's allergies indicates no known allergies.  Home Medications   Current Outpatient Rx  Name  Route  Sig  Dispense  Refill  .  AMLODIPINE BESYLATE 5 MG PO TABS   Oral   Take 5 mg by mouth daily.         . ASPIRIN 81 MG PO TBEC   Oral   Take 81 mg by mouth daily.         . CYCLOBENZAPRINE HCL 10 MG PO TABS   Oral   Take 1 tablet (10 mg total) by mouth 2 (two) times daily as needed for muscle spasms.   20 tablet   0   . FINASTERIDE 5 MG PO TABS   Oral   Take 1 tablet (5 mg total) by mouth daily.   90 tablet   3   . GABAPENTIN 100 MG PO CAPS   Oral   Take 100 mg by mouth 2 (two) times daily.         Marland Kitchen LABETALOL HCL 200 MG PO TABS   Oral   Take 200 mg by mouth 2 (two) times daily.         . OXYCODONE-ACETAMINOPHEN 5-325 MG PO TABS   Oral   Take 1 tablet by mouth every 4 (four) hours as needed for pain.   40 tablet   0   . TAMSULOSIN HCL 0.4 MG PO CAPS  Oral   Take 0.8 mg by mouth daily after supper.         . TRAMADOL-ACETAMINOPHEN 37.5-325 MG PO TABS   Oral   Take 1 tablet by mouth every 6 (six) hours as needed for pain.   60 tablet   1     BP 99/70  Pulse 82  Temp 98.1 F (36.7 C) (Oral)  Resp 18  SpO2 100%  Physical Exam  Nursing note and vitals reviewed. Constitutional: He is oriented to person, place, and time. He appears well-developed and well-nourished. No distress.  HENT:  Head: Normocephalic and atraumatic.  Mouth/Throat: Oropharynx is clear and moist. No oropharyngeal exudate.  Eyes: EOM are normal. Pupils are equal, round, and reactive to light. Right eye exhibits no discharge. Left eye exhibits no discharge.  Neck: Neck supple. No tracheal deviation present.       Positive for cervical lymphadenopathy.   Cardiovascular: Normal rate, regular rhythm and normal heart sounds.  Exam reveals no gallop and no friction rub.   No murmur heard. Pulmonary/Chest: Effort normal and breath sounds normal. No respiratory distress. He has no wheezes.  Musculoskeletal: Normal range of motion.  Lymphadenopathy:    He has cervical adenopathy.  Neurological: He is alert and  oriented to person, place, and time.  Skin: Skin is warm and dry. No rash noted.  Psychiatric: He has a normal mood and affect. His behavior is normal.    ED Course  Procedures (including critical care time)  DIAGNOSTIC STUDIES: Oxygen Saturation is 100% on room air, normal by my interpretation.    COORDINATION OF CARE: 12:52 PM Discussed treatment plan which includes chest x-ray with pt at bedside and pt agreed to plan.  The patient most likely has a Viral URI based on his HPI and PE. The patient is advised to follow up with his PCP. The patient told to increase his fluid intake.      MDM  I personally performed the services described in this documentation, which was scribed in my presence. The recorded information has been reviewed and is accurate.      Carlyle Dolly, PA-C 06/01/12 1342

## 2012-06-01 NOTE — ED Notes (Signed)
Patient states that he can not sleep at night because he is coughing so much. The patient reports that he is coughing up mucous

## 2012-06-01 NOTE — ED Notes (Signed)
Patient transported to X-ray 

## 2012-06-01 NOTE — ED Provider Notes (Signed)
Medical screening examination/treatment/procedure(s) were performed by non-physician practitioner and as supervising physician I was immediately available for consultation/collaboration.   Lyanne Co, MD 06/01/12 [phone]

## 2012-06-22 ENCOUNTER — Emergency Department (HOSPITAL_COMMUNITY)
Admission: EM | Admit: 2012-06-22 | Discharge: 2012-06-22 | Disposition: A | Discharge status: Home / Self Care | Payer: Medicaid Other | Attending: Emergency Medicine | Admitting: Emergency Medicine

## 2012-06-22 DIAGNOSIS — G8929 Other chronic pain: Secondary | ICD-10-CM | POA: Diagnosis not present

## 2012-06-22 DIAGNOSIS — Z79899 Other long term (current) drug therapy: Secondary | ICD-10-CM | POA: Diagnosis not present

## 2012-06-22 DIAGNOSIS — Z87448 Personal history of other diseases of urinary system: Secondary | ICD-10-CM | POA: Diagnosis not present

## 2012-06-22 DIAGNOSIS — Z87440 Personal history of urinary (tract) infections: Secondary | ICD-10-CM | POA: Diagnosis not present

## 2012-06-22 DIAGNOSIS — Z7982 Long term (current) use of aspirin: Secondary | ICD-10-CM | POA: Diagnosis not present

## 2012-06-22 DIAGNOSIS — Z87891 Personal history of nicotine dependence: Secondary | ICD-10-CM | POA: Insufficient documentation

## 2012-06-22 DIAGNOSIS — I10 Essential (primary) hypertension: Secondary | ICD-10-CM | POA: Diagnosis not present

## 2012-06-22 DIAGNOSIS — Z862 Personal history of diseases of the blood and blood-forming organs and certain disorders involving the immune mechanism: Secondary | ICD-10-CM | POA: Insufficient documentation

## 2012-06-22 DIAGNOSIS — N138 Other obstructive and reflux uropathy: Secondary | ICD-10-CM | POA: Insufficient documentation

## 2012-06-22 DIAGNOSIS — Z8611 Personal history of tuberculosis: Secondary | ICD-10-CM | POA: Insufficient documentation

## 2012-06-22 DIAGNOSIS — R339 Retention of urine, unspecified: Secondary | ICD-10-CM | POA: Insufficient documentation

## 2012-06-22 DIAGNOSIS — N401 Enlarged prostate with lower urinary tract symptoms: Secondary | ICD-10-CM | POA: Diagnosis not present

## 2012-06-22 DIAGNOSIS — N183 Chronic kidney disease, stage 3 unspecified: Secondary | ICD-10-CM | POA: Insufficient documentation

## 2012-06-22 LAB — URINALYSIS, MICROSCOPIC ONLY
Glucose, UA: NEGATIVE mg/dL
pH: 6.5 (ref 5.0–8.0)

## 2012-06-22 NOTE — ED Notes (Signed)
When pt arrived to room, very anxious d/t lower abdominal/bladder pain, attempted x 2 to void in restroom unable, states went to urologist today and had catheter removed to see if he could void on his own, has had catheter since June d/t being unable to void, pt states was told to drink lots of fluids at home which he did, but has not been able to void since 9 am. Bladder scanner done showed >850 cc. Foley catheter placed, draining clear/yellow urine.

## 2012-06-22 NOTE — ED Provider Notes (Signed)
History     CSN: [id number]  Arrival date & time 06/22/12  1529   First MD Initiated Contact with Patient 06/22/12 1555     Chief complaint: Urinary retention  (Consider location/radiation/quality/duration/timing/severity/associated sxs/prior treatment) The history is provided by the patient.   62 year old male has had an indwelling Foley catheter since last June. He saw his urologist this morning and decided to try to remove the catheter to see if he could tolerate having it out. He is not able to urinate since then and is very uncomfortable.  Past Medical History  Diagnosis Date  . Hypertension   . Dysuria   . UTI (urinary tract infection)   . Tuberculosis     h/o PPD +  . BPH (benign prostatic hyperplasia)   . Anemia     normal Fe, nl B12, nl retic, nl EPO July '13  . Chronic kidney disease     CKD III, obstructive nephropathy  . Elevated PSA, greater than or equal to 20 ng/ml June '13    PSA 107  . Chronic back pain     Past Surgical History  Procedure Date  . Spleenectomy     Family History  Problem Relation Age of Onset  . Diabetes Mother   . Stroke Brother   . Hearing loss Brother     History  Substance Use Topics  . Smoking status: Former Smoker    Types: Cigarettes  . Smokeless tobacco: Never Used  . Alcohol Use: No      Review of Systems  All other systems reviewed and are negative.    Allergies  Review of patient's allergies indicates no known allergies.  Home Medications   Current Outpatient Rx  Name  Route  Sig  Dispense  Refill  . AMLODIPINE BESYLATE 5 MG PO TABS   Oral   Take 5 mg by mouth daily.         . ASPIRIN 81 MG PO TBEC   Oral   Take 81 mg by mouth daily.         . CYCLOBENZAPRINE HCL 10 MG PO TABS   Oral   Take 1 tablet (10 mg total) by mouth 2 (two) times daily as needed for muscle spasms.   20 tablet   0   . FINASTERIDE 5 MG PO TABS   Oral   Take 1 tablet (5 mg total) by mouth daily.   90 tablet   3   .  GABAPENTIN 100 MG PO CAPS   Oral   Take 100 mg by mouth 2 (two) times daily.         . GUAIFENESIN ER 1200 MG PO TB12   Oral   Take 1 tablet (1,200 mg total) by mouth 2 (two) times daily.   20 each   0   . LABETALOL HCL 200 MG PO TABS   Oral   Take 200 mg by mouth 2 (two) times daily.         . OXYCODONE-ACETAMINOPHEN 5-325 MG PO TABS   Oral   Take 1 tablet by mouth every 4 (four) hours as needed for pain.   40 tablet   0   . PREDNISONE 50 MG PO TABS   Oral   Take 1 tablet (50 mg total) by mouth daily.   5 tablet   0   . PROMETHAZINE-DM 6.25-15 MG/5ML PO SYRP   Oral   Take 5 mLs by mouth 4 (four) times daily as needed for cough.  120 mL   0   . TAMSULOSIN HCL 0.4 MG PO CAPS   Oral   Take 0.8 mg by mouth daily after supper.         . TRAMADOL-ACETAMINOPHEN 37.5-325 MG PO TABS   Oral   Take 1 tablet by mouth every 6 (six) hours as needed for pain.   60 tablet   1     BP 163/108  Pulse 70  Temp 98.3 F (36.8 C) (Oral)  Resp 18  SpO2 98%  Physical Exam  Nursing note and vitals reviewed.  62 year old male, resting comfortably and in no acute distress. Vital signs are significant for hypertension with blood pressure 163/108. Oxygen saturation is 98%, which is normal. Head is normocephalic and atraumatic. PERRLA, EOMI. Oropharynx is clear. Neck is nontender and supple without adenopathy or JVD. Back is nontender and there is no CVA tenderness. Lungs are clear without rales, wheezes, or rhonchi. Chest is nontender. Heart has regular rate and rhythm without murmur. Abdomen is soft, flat, nontender without masses or hepatosplenomegaly and peristalsis is normoactive. Bladder is distended. Extremities have no cyanosis or edema, full range of motion is present. Skin is warm and dry without rash. Neurologic: Mental status is normal, cranial nerves are intact, there are no motor or sensory deficits.  ED Course  Procedures (including critical care  time)  Results for orders placed during the hospital encounter of 06/22/12  URINALYSIS, MICROSCOPIC ONLY      Component Value Range   Color, Urine YELLOW  YELLOW   APPearance CLEAR  CLEAR   Specific Gravity, Urine 1.010  1.005 - 1.030   pH 6.5  5.0 - 8.0   Glucose, UA NEGATIVE  NEGATIVE mg/dL   Hgb urine dipstick SMALL (*) NEGATIVE   Bilirubin Urine NEGATIVE  NEGATIVE   Ketones, ur NEGATIVE  NEGATIVE mg/dL   Protein, ur NEGATIVE  NEGATIVE mg/dL   Urobilinogen, UA 0.2  0.0 - 1.0 mg/dL   Nitrite NEGATIVE  NEGATIVE   Leukocytes, UA MODERATE (*) NEGATIVE   WBC, UA 7-10  <3 WBC/hpf   RBC / HPF 3-6  <3 RBC/hpf   Bacteria, UA MANY (*) RARE    1. Urinary retention       MDM  Urinary retention. Foley catheter is placed and is draining about 500 mL of clear urine. He will be discharged to continue to follow up with his urologist.        Dione Booze, MD 06/22/12 1731

## 2012-06-22 NOTE — ED Notes (Signed)
Patient has been self cathing since June, went to [facility] today and was told to come to ED if he had not urinated since 3pm.  Pt reports blood in urine.  Patient has history of prostate problem.

## 2012-06-26 LAB — URINE CULTURE

## 2012-06-27 NOTE — ED Notes (Signed)
+   Urine Chart appended per protocol MD. 

## 2012-06-28 NOTE — ED Notes (Signed)
Chart returned from EDP office . Rx for Ciprofloxacin 250 po BID x 3 days per Jonathon Jordan.

## 2012-07-01 ENCOUNTER — Telehealth (HOSPITAL_COMMUNITY): Payer: Self-pay | Admitting: Emergency Medicine

## 2012-07-02 NOTE — ED Notes (Signed)
Unable to contact patient via phone. Sent letter. °

## 2012-07-14 NOTE — ED Notes (Signed)
Letter returned , Chart appended .

## 2012-08-27 ENCOUNTER — Telehealth (HOSPITAL_COMMUNITY): Payer: Self-pay | Admitting: Emergency Medicine

## 2012-08-27 NOTE — ED Notes (Signed)
No response to letter sent after 30 days. Chart sent to Medical Records. °

## 2012-09-26 ENCOUNTER — Telehealth: Payer: Self-pay | Admitting: Internal Medicine

## 2012-09-26 NOTE — Telephone Encounter (Signed)
Pt wants to know if we have any discount cards on any of his prescriptions

## 2012-09-26 NOTE — Telephone Encounter (Signed)
Phone call to pt and he states he is already here at the office

## 2012-10-12 ENCOUNTER — Encounter: Payer: Self-pay | Admitting: Internal Medicine

## 2012-11-11 ENCOUNTER — Encounter (HOSPITAL_COMMUNITY): Payer: Self-pay | Admitting: Emergency Medicine

## 2012-11-11 ENCOUNTER — Emergency Department (HOSPITAL_COMMUNITY)
Admission: EM | Admit: 2012-11-11 | Discharge: 2012-11-11 | Disposition: A | Discharge status: Home / Self Care | Payer: No Typology Code available for payment source | Attending: Emergency Medicine | Admitting: Emergency Medicine

## 2012-11-11 DIAGNOSIS — N401 Enlarged prostate with lower urinary tract symptoms: Secondary | ICD-10-CM | POA: Insufficient documentation

## 2012-11-11 DIAGNOSIS — G8929 Other chronic pain: Secondary | ICD-10-CM | POA: Insufficient documentation

## 2012-11-11 DIAGNOSIS — M549 Dorsalgia, unspecified: Secondary | ICD-10-CM | POA: Insufficient documentation

## 2012-11-11 DIAGNOSIS — Z862 Personal history of diseases of the blood and blood-forming organs and certain disorders involving the immune mechanism: Secondary | ICD-10-CM | POA: Insufficient documentation

## 2012-11-11 DIAGNOSIS — I129 Hypertensive chronic kidney disease with stage 1 through stage 4 chronic kidney disease, or unspecified chronic kidney disease: Secondary | ICD-10-CM | POA: Insufficient documentation

## 2012-11-11 DIAGNOSIS — R339 Retention of urine, unspecified: Secondary | ICD-10-CM

## 2012-11-11 DIAGNOSIS — N390 Urinary tract infection, site not specified: Secondary | ICD-10-CM | POA: Insufficient documentation

## 2012-11-11 DIAGNOSIS — N138 Other obstructive and reflux uropathy: Secondary | ICD-10-CM | POA: Insufficient documentation

## 2012-11-11 DIAGNOSIS — Z466 Encounter for fitting and adjustment of urinary device: Secondary | ICD-10-CM | POA: Insufficient documentation

## 2012-11-11 DIAGNOSIS — Z79899 Other long term (current) drug therapy: Secondary | ICD-10-CM | POA: Insufficient documentation

## 2012-11-11 DIAGNOSIS — Z7982 Long term (current) use of aspirin: Secondary | ICD-10-CM | POA: Insufficient documentation

## 2012-11-11 DIAGNOSIS — R300 Dysuria: Secondary | ICD-10-CM | POA: Insufficient documentation

## 2012-11-11 DIAGNOSIS — Z87440 Personal history of urinary (tract) infections: Secondary | ICD-10-CM | POA: Insufficient documentation

## 2012-11-11 DIAGNOSIS — Z87891 Personal history of nicotine dependence: Secondary | ICD-10-CM | POA: Insufficient documentation

## 2012-11-11 DIAGNOSIS — R350 Frequency of micturition: Secondary | ICD-10-CM | POA: Insufficient documentation

## 2012-11-11 DIAGNOSIS — N183 Chronic kidney disease, stage 3 unspecified: Secondary | ICD-10-CM | POA: Insufficient documentation

## 2012-11-11 LAB — POCT I-STAT, CHEM 8
BUN: 37 mg/dL — ABNORMAL HIGH (ref 6–23)
Calcium, Ion: 1.19 mmol/L (ref 1.13–1.30)
TCO2: 30 mmol/L (ref 0–100)

## 2012-11-11 LAB — URINALYSIS, ROUTINE W REFLEX MICROSCOPIC
Bilirubin Urine: NEGATIVE
Glucose, UA: NEGATIVE mg/dL
Ketones, ur: NEGATIVE mg/dL
pH: 8 (ref 5.0–8.0)

## 2012-11-11 LAB — URINE MICROSCOPIC-ADD ON

## 2012-11-11 MED ORDER — CIPROFLOXACIN HCL 500 MG PO TABS
500.0000 mg | ORAL_TABLET | Freq: Once | ORAL | Status: AC
  Administered 2012-11-11: 500 mg via ORAL
  Filled 2012-11-11: qty 1

## 2012-11-11 MED ORDER — CIPROFLOXACIN HCL 500 MG PO TABS: 500.0000 mg | ORAL_TABLET | Freq: Two times a day (BID) | ORAL | Status: DC

## 2012-11-11 NOTE — ED Notes (Signed)
Pt call out to nurse to look at what was in the foley thick cream colored secretions mod amt

## 2012-11-11 NOTE — ED Provider Notes (Signed)
History     CSN: [id number]  Arrival date & time 11/11/12  1300   First MD Initiated Contact with Patient 11/11/12 1321      Chief Complaint  Patient presents with  . Urinary Retention    (Consider location/radiation/quality/duration/timing/severity/associated sxs/prior treatment) HPI Comments: Patient reports history of BPH and chronic indwelling Foley for the past one year. He gets is changed every 5 weeks by Dr. Brunilda Payor. It was last changed about 3 weeks ago. This morning when he woke up he noticed the bag was only partially full and is normally followed away. Urine is leaking around his penis. He feels like the catheter is not draining. His abdomen feels distended. Denies any nausea or vomiting. Denies Fever. Denies any weakness in his legs, numbness or tingling. Denies any testicle pain.  The history is provided by the patient.    Past Medical History  Diagnosis Date  . Hypertension   . Dysuria   . UTI (urinary tract infection)   . Tuberculosis     h/o PPD +  . BPH (benign prostatic hyperplasia)   . Anemia     normal Fe, nl B12, nl retic, nl EPO July '13  . Chronic kidney disease     CKD III, obstructive nephropathy  . Elevated PSA, greater than or equal to 20 ng/ml June '13    PSA 107  . Chronic back pain     Past Surgical History  Procedure Laterality Date  . Spleenectomy      Family History  Problem Relation Age of Onset  . Diabetes Mother   . Stroke Brother   . Hearing loss Brother     History  Substance Use Topics  . Smoking status: Former Smoker    Types: Cigarettes  . Smokeless tobacco: Never Used  . Alcohol Use: No      Review of Systems  Constitutional: Negative for fever, activity change and appetite change.  HENT: Negative for congestion and rhinorrhea.   Respiratory: Negative for chest tightness.   Cardiovascular: Negative for chest pain.  Gastrointestinal: Negative for nausea, vomiting and abdominal pain.  Genitourinary: Positive for  dysuria, frequency and difficulty urinating. Negative for discharge and testicular pain.  Musculoskeletal: Negative for back pain.  Neurological: Negative for dizziness, weakness and headaches.  A complete 10 system review of systems was obtained and all systems are negative except as noted in the HPI and PMH.    Allergies  Review of patient's allergies indicates no known allergies.  Home Medications   Current Outpatient Rx  Name  Route  Sig  Dispense  Refill  . amLODipine (NORVASC) 5 MG tablet   Oral   Take 5 mg by mouth daily.         Marland Kitchen aspirin 81 MG EC tablet   Oral   Take 81 mg by mouth daily.         . finasteride (PROSCAR) 5 MG tablet   Oral   Take 1 tablet (5 mg total) by mouth daily.   90 tablet   3   . labetalol (NORMODYNE) 200 MG tablet   Oral   Take 200 mg by mouth daily.          . Tamsulosin HCl (FLOMAX) 0.4 MG CAPS   Oral   Take 0.8 mg by mouth daily after supper.           BP 128/99  Pulse 79  Temp(Src) 98.8 F (37.1 C) (Oral)  Resp 18  SpO2 95%  Physical Exam  Constitutional: He is oriented to person, place, and time. He appears well-developed and well-nourished. No distress.  HENT:  Head: Normocephalic and atraumatic.  Mouth/Throat: Oropharynx is clear and moist. No oropharyngeal exudate.  Eyes: Conjunctivae and EOM are normal. Pupils are equal, round, and reactive to light.  Neck: Normal range of motion. Neck supple.  Cardiovascular: Normal rate, regular rhythm and normal heart sounds.   Pulmonary/Chest: Effort normal and breath sounds normal. No respiratory distress.  Abdominal: Soft. He exhibits distension. There is no tenderness. There is no rebound and no guarding.  Genitourinary:  Foley in place. No testicular tenderness  Musculoskeletal: Normal range of motion. He exhibits no edema and no tenderness.  5/5 strength in bilateral lower extremities. Ankle plantar and dorsiflexion intact. Great toe extension intact bilaterally. +2 DP  and PT pulses. +2 patellar reflexes bilaterally. Normal gait.   Neurological: He is alert and oriented to person, place, and time. No cranial nerve deficit. He exhibits normal muscle tone. Coordination normal.  Skin: Skin is warm.    ED Course  Procedures (including critical care time)  Labs Reviewed  POCT I-STAT, CHEM 8 - Abnormal; Notable for the following:    BUN 37 (*)    Creatinine, Ser 2.50 (*)    Glucose, Bld 106 (*)    All other components within normal limits  URINE CULTURE  URINALYSIS, ROUTINE W REFLEX MICROSCOPIC   No results found.   No diagnosis found.    MDM  Urinary retention with dysfunctioning foley catheter. Vitals stable, no distress, abdomen soft and nonsurgical. No weakness, numbness, tingling, perianal paresthesias.  Foley could not be irrigated. It was replaced by nursing staff. Creatinine is at baseline.  New Foley catheter placed and was draining freely. Patient reports relief in abdominal distention. UA consistent with UTI.  Culture sent and cipro started.     Glynn Octave, MD 11/11/12 [phone]

## 2012-11-11 NOTE — ED Notes (Signed)
Foley irrigated met lots of resistance

## 2012-11-11 NOTE — ED Notes (Addendum)
Pt reports having a catheter placed about three weeks ago due to difficulty urinating. Pt states it has been working normally until this morning, in which he awoke with his collection chamber was only half full, of which it normally is full. Pt reports urine is leaking around the penis and the catheter. Pt abdomen is distended, which he states has decreasing since the catheter was placed, which he fills has increased since this morning, in which he reports feeling pressure.  Pt reports a history of enlarged prostate.

## 2012-11-14 LAB — URINE CULTURE: Colony Count: >=100000

## 2012-11-15 NOTE — ED Notes (Signed)
Post ED Visit - Positive Culture Follow-up  Culture report reviewed by antimicrobial stewardship pharmacist: [x]  Wes Dulaney, Pharm.D., BCPS []  Celedonio Miyamoto, Pharm.D., BCPS []  Georgina Pillion, Pharm.D., BCPS []  [place], [address].D., BCPS, AAHIVP []  Estella Husk, Pharm.D., BCPS, AAHIVP  Positive  urine culture  no further patient follow-up is required at this time.  Larena Sox 11/15/2012, 4:28 PM

## 2012-11-24 ENCOUNTER — Other Ambulatory Visit: Payer: Self-pay

## 2012-11-24 MED ORDER — FINASTERIDE 5 MG PO TABS: 5.0000 mg | ORAL_TABLET | Freq: Every day | ORAL | Status: DC

## 2012-11-24 MED ORDER — AMLODIPINE BESYLATE 5 MG PO TABS: 5.0000 mg | ORAL_TABLET | Freq: Every day | ORAL | Status: DC

## 2012-11-24 MED ORDER — TAMSULOSIN HCL 0.4 MG PO CAPS: 0.8000 mg | ORAL_CAPSULE | Freq: Every day | ORAL | Status: DC

## 2013-01-18 ENCOUNTER — Encounter: Payer: Self-pay | Admitting: Internal Medicine

## 2013-01-18 ENCOUNTER — Ambulatory Visit (INDEPENDENT_AMBULATORY_CARE_PROVIDER_SITE_OTHER): Payer: No Typology Code available for payment source | Admitting: Internal Medicine

## 2013-01-18 VITALS — BP 148/102 | HR 67 | Temp 97.9°F | Wt 247.0 lb

## 2013-01-18 DIAGNOSIS — G609 Hereditary and idiopathic neuropathy, unspecified: Secondary | ICD-10-CM

## 2013-01-18 DIAGNOSIS — N400 Enlarged prostate without lower urinary tract symptoms: Secondary | ICD-10-CM

## 2013-01-18 DIAGNOSIS — R7309 Other abnormal glucose: Secondary | ICD-10-CM

## 2013-01-18 DIAGNOSIS — N179 Acute kidney failure, unspecified: Secondary | ICD-10-CM

## 2013-01-18 DIAGNOSIS — I10 Essential (primary) hypertension: Secondary | ICD-10-CM

## 2013-01-18 MED ORDER — GABAPENTIN 100 MG PO CAPS: 100.0000 mg | ORAL_CAPSULE | Freq: Three times a day (TID) | ORAL | Status: DC

## 2013-01-18 NOTE — Progress Notes (Signed)
  Subjective:    Patient ID: [name], male    DOB: 06/24/1949, 62 y.o.   MRN: [id number]  HPI [name] presents for burning pain in the great toes and legs. This is a long standing problem. He is worried about infection.  He still has a foley cath. Reviewed CT images to demonstrate the size of the prostate and that it is a mechanical problem and he may need surgery to resolve the bladder out let obstruction. He follows with Dr. Brunilda Payor  PMH, FamHx and SocHx reviewed for any changes and relevance. Current Outpatient Prescriptions on File Prior to Visit  Medication Sig Dispense Refill  . amLODipine (NORVASC) 5 MG tablet Take 1 tablet (5 mg total) by mouth daily.  90 tablet  1  . finasteride (PROSCAR) 5 MG tablet Take 1 tablet (5 mg total) by mouth daily.  90 tablet  1  . tamsulosin (FLOMAX) 0.4 MG CAPS Take 2 capsules (0.8 mg total) by mouth daily after supper.  90 capsule  1  . labetalol (NORMODYNE) 200 MG tablet Take 200 mg by mouth daily.        No current facility-administered medications on file prior to visit.       Review of Systems System review is negative for any constitutional, cardiac, pulmonary, GI or neuro symptoms or complaints other than as described in the HPI.     Objective:   Physical Exam Filed Vitals:   01/18/13 1144  BP: 148/102  Pulse: 67  Temp: 97.9 F (36.6 C)   Wt Readings from Last 3 Encounters:  01/18/13 247 lb (112.038 kg)  05/02/12 230 lb 1.3 oz (104.364 kg)  12/08/11 193 lb 9 oz (87.8 kg)   BP Readings from Last 3 Encounters:  01/18/13 148/102  11/11/12 124/69  06/22/12 150/103   Gen'l - Tall African man in no distress HEENT- [place]/AT, C&S clear Cor- RRR Pulm - normal respirations Ext - no edema or deformity Neuro - decreased sensation both feet to light touch, pinprick discrimination, decrease deep vibratory sensation.       Assessment & Plan:

## 2013-01-18 NOTE — Patient Instructions (Addendum)
Prostate enlargement with bladder outlet obstruction - this is a mechanical problem and the solution may be prostate removal so that you can urinate normally.  Burning pain in the feet and legs - no sign of infection. This is a peripheral neuropathy - nerve related damage and pain. Plan Gabapentin 100 mg: start at once a day at bedtime for 5 days, then twice a day for 5 days and then 3 times a day. Let me know if this helps the pain.    Benign Prostatic Hypertrophy  The prostate gland is part of the reproductive system of men. A normal prostate is about the size and shape of a walnut. The prostate gland makes a fluid that is mixed with sperm to make semen. This gland surrounds the urethra and is located in front of the rectum and just below the bladder. The bladder is where urine is stored. The urethra is the tube through which urine passes from the bladder to get out of the body. The prostate grows as a man ages. An enlarged prostate not caused by cancer is called benign prostatic hypertrophy (BPH). This is a common health problem in men over age 10. This condition is a normal part of aging. An enlarged prostate presses on the urethra. This makes it harder to pass urine. In the early stages of enlargement, the bladder can get by with a narrowed urethra by forcing the urine through. If the problem gets worse, medical or surgical treatment may be required.  This condition should be followed by your caregiver. Longstanding back pressure on the kidneys can cause infection. Back pressure and infection can progress to bladder damage and kidney (renal) failure. If needed, your caregiver may refer you to a specialist in kidney and prostate disease (urologist). CAUSES  The exact cause is not known.  SYMPTOMS   You are not able to completely empty your bladder.  Getting up often during the night to urinate.  Need to urinate frequently during the day.  Difficultly in starting urine flow.  Decrease in  size and strength of the urine stream.  Dribbling after urination.  Pain on urination (more common with infection).  Inability to pass your water. This needs immediate treatment. DIAGNOSIS  These tests will help your caregiver understand your problem:  Digital rectal exam (DRE). In a rectal exam, your caregiver checks your prostate by putting a gloved, lubricated finger into the rectum to feel the back of your prostate gland. This exam detects the size of the gland and abnormal lumps or growths.  Urinalysis (exam of the urine). This may include a culture if there is concern about infection.  Prostate Specific Antigen (PSA). This is a blood test used to screen for prostate cancer. It is not used alone for diagnosing prostate cancer.  Rectal ultrasound (sonogram). This test uses sound waves to electronically produce a "picture" of the prostate. It helps examine the prostate gland for cancer. TREATMENT  Mild symptoms may not need treatment. Simple observation and yearly exams may be all that is required. Medications and surgery are options for more severe problems. Your caregiver can help you make an informed decision for what is best. Two classes of medications are available for relief of prostate symptoms:  Medications that shrink the prostate. This helps relieve symptoms.  Uncommon side effects include problems with sexual function.  Medications to relax the muscle of the prostate. This also relieves the obstruction.  Side effects can include dizziness, fatigue, lightheadedness, and retrograde ejaculation (diminished volume  of ejaculate). Several types of surgical treatments are available for relief of prostate symptoms:  Transurethral resection of the prostate (TURP). In this treatment, an instrument is inserted through opening at the tip of the penis. It is used to cut away pieces of the inner core of the prostate. The pieces are removed through the same opening of the penis. This  removes the obstruction and helps get rid of the symptoms.  Transurethral incision (TUIP). In this procedure, small cuts are made in the prostate. This lessens the prostates pressure on the urethra.  Transurethral microwave thermotherapy (TUMT). This procedure uses microwaves to create heat. The heat destroys and removes a small amount of prostate tissue.  Transurethral needle ablation (TUNA). This is a procedure that uses radio frequencies to do the same as TUMT.  Interstitial laser coagulation (ILC). This is a procedure that uses a laser to do the same as TUMT and TUNA.  Transurethral electrovaporization (TUVP). This is a procedure that uses electrodes to do the same as the procedures listed above. Regardless of the method of treatment chosen, you and your caregiver will discuss the options. With this knowledge, you along with your caregiver can decide upon the best treatment for you. SEEK MEDICAL CARE IF:   You develop chills, fever of 100.5 F (38.1 C), or night sweats.  There is unexplained back pain.  Symptoms are not helped by medications prescribed.  You develop medication side effects.  Your urine becomes very dark or has a bad smell. SEEK IMMEDIATE MEDICAL CARE IF:   You are suddenly unable to urinate. This is an emergency. You should be seen immediately.  There are large amounts of blood or clots in the urine.  Your urinary problems become unmanageable.  You develop lightheadedness, severe dizziness, or you feel faint.  You develop moderate to severe low back or flank pain.  You develop chills or fever. Document Released: 05/24/2005 Document Revised: 08/16/2011 Document Reviewed: 02/13/2007 [facility] Patient Information 2014 [place], [place].    Peripheral Neuropathy Peripheral neuropathy is a common disorder of your nerves resulting from damage. CAUSES  This disorder may be caused by a disease of the nerves or illness. Many neuropathies have well known causes  such as:  Diabetes. This is one of the most common causes.   Uremia.   AIDS.   Nutritional deficiencies.   Other causes include mechanical pressures. These may be from:   Compression.   Injury.   Contusions or bruises.   Fracture or dislocated bones.   Pressure involving the nerves close to the surface. Nerves such as the ulnar, or radial can be injured by prolonged use of crutches.  Other injuries may come from:  Tumor.   Hemorrhage or bleeding into a nerve.   Exposure to cold or radiation.   Certain medicines or toxic substances (rare).   Vascular or collagen disorders such as:   Atherosclerosis.   Systemic lupus erythematosus.   Scleroderma.   Sarcoidosis.   Rheumatoid arthritis.   Polyarteritis nodosa.   A large number of cases are of unknown cause.  SYMPTOMS  Common problems include:  Weakness.   Numbness.   Abnormal sensations (paresthesia) such as:   Burning.   Tickling.   Pricking.   Tingling.   Pain in the arms, hands, legs and/or feet.  TREATMENT  Therapy for this disorder differs depending on the cause. It may vary from medical treatment with medications or physical therapy among others.   For example, therapy for this disorder caused  by diabetes involves control of the diabetes.   In cases where a tumor or ruptured disc is the cause, therapy may involve surgery. This would be to remove the tumor or to repair the ruptured disc.   In entrapment or compression neuropathy, treatment may consist of splinting or surgical decompression of the ulnar or median nerves. A common example of entrapment neuropathy is carpal tunnel syndrome. This has become more common because of the increasing use of computers.   Peroneal and radial compression neuropathies may require avoidance of pressure.   Physical therapy and/or splints may be useful in preventing contractures. This is a condition in which shortened muscles around joints cause abnormal and  sometimes painful positioning of the joints.  Document Released: 05/14/2002 Document Revised: 02/03/2011 Document Reviewed: 05/24/2005 [facility] Patient Information 2012 [place], [place].

## 2013-01-22 DIAGNOSIS — G609 Hereditary and idiopathic neuropathy, unspecified: Secondary | ICD-10-CM | POA: Insufficient documentation

## 2013-01-22 NOTE — Assessment & Plan Note (Signed)
Lab Results  Component Value Date   HGBA1C 5.5 12/04/2011   No evidence of diabetes.

## 2013-01-22 NOTE — Assessment & Plan Note (Signed)
Reviewed CT images with him demonstration very large prostate and probable cause of bladder outlet obstruction.  Plan Per Dr. Brunilda Payor - patient needs to consider surgery.

## 2013-01-22 NOTE — Assessment & Plan Note (Signed)
BP Readings from Last 3 Encounters:  01/18/13 148/102  11/11/12 124/69  06/22/12 150/103   OK control. [facility])  Plan Continue present medication

## 2013-01-22 NOTE — Assessment & Plan Note (Signed)
Exam is w/o signs of lesions as source of burning pain. 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  Plan Start low dose gabapentin - working up to 100 mg tid.

## 2013-01-22 NOTE — Assessment & Plan Note (Signed)
Reviewed Dr. Marland Mcalpine last note: [name] is stable with now chronic renal insufficiency.

## 2013-01-30 ENCOUNTER — Encounter (HOSPITAL_COMMUNITY): Payer: Self-pay | Admitting: Vascular Surgery

## 2013-01-30 ENCOUNTER — Emergency Department (HOSPITAL_COMMUNITY)
Admission: EM | Admit: 2013-01-30 | Discharge: 2013-01-31 | Disposition: A | Discharge status: Home / Self Care | Payer: No Typology Code available for payment source | Attending: Emergency Medicine | Admitting: Emergency Medicine

## 2013-01-30 DIAGNOSIS — N400 Enlarged prostate without lower urinary tract symptoms: Secondary | ICD-10-CM | POA: Insufficient documentation

## 2013-01-30 DIAGNOSIS — M549 Dorsalgia, unspecified: Secondary | ICD-10-CM | POA: Insufficient documentation

## 2013-01-30 DIAGNOSIS — Z7982 Long term (current) use of aspirin: Secondary | ICD-10-CM | POA: Insufficient documentation

## 2013-01-30 DIAGNOSIS — Z8611 Personal history of tuberculosis: Secondary | ICD-10-CM | POA: Insufficient documentation

## 2013-01-30 DIAGNOSIS — N183 Chronic kidney disease, stage 3 unspecified: Secondary | ICD-10-CM | POA: Insufficient documentation

## 2013-01-30 DIAGNOSIS — Z87891 Personal history of nicotine dependence: Secondary | ICD-10-CM | POA: Insufficient documentation

## 2013-01-30 DIAGNOSIS — N289 Disorder of kidney and ureter, unspecified: Secondary | ICD-10-CM

## 2013-01-30 DIAGNOSIS — Z79899 Other long term (current) drug therapy: Secondary | ICD-10-CM | POA: Insufficient documentation

## 2013-01-30 DIAGNOSIS — Z87448 Personal history of other diseases of urinary system: Secondary | ICD-10-CM | POA: Insufficient documentation

## 2013-01-30 DIAGNOSIS — I129 Hypertensive chronic kidney disease with stage 1 through stage 4 chronic kidney disease, or unspecified chronic kidney disease: Secondary | ICD-10-CM | POA: Insufficient documentation

## 2013-01-30 DIAGNOSIS — D649 Anemia, unspecified: Secondary | ICD-10-CM | POA: Insufficient documentation

## 2013-01-30 DIAGNOSIS — N390 Urinary tract infection, site not specified: Secondary | ICD-10-CM | POA: Insufficient documentation

## 2013-01-30 DIAGNOSIS — G8929 Other chronic pain: Secondary | ICD-10-CM | POA: Insufficient documentation

## 2013-01-30 LAB — URINALYSIS, ROUTINE W REFLEX MICROSCOPIC
Bilirubin Urine: NEGATIVE
Specific Gravity, Urine: 1.013 (ref 1.005–1.030)
Urobilinogen, UA: 0.2 mg/dL (ref 0.0–1.0)

## 2013-01-30 NOTE — ED Notes (Addendum)
Pt reports to the ED for eval of dizziness x 2 days. Pt denies any N/V/D, fevers, or chills. 12 lead NSR per EMS. Pt was hypertensive en route. Pt has 15 mmHg difference between the right and left arm. Pt A&O x4. No neuro deficits. Pt denies any pain or SOB. Pt has urinary catheter in place for recurrent UTI and kidney infections.

## 2013-01-30 NOTE — ED Provider Notes (Signed)
CSN: [id number]     Arrival date & time 01/30/13  2232 History   First MD Initiated Contact with Patient 01/30/13 2300     Chief Complaint  Patient presents with  . Dizziness  . Hypertension   (Consider location/radiation/quality/duration/timing/severity/associated sxs/prior Treatment) Patient is a 62 y.o. male presenting with hypertension. The history is provided by the patient.  Hypertension  He complains of dizziness which started yesterday and is worse today. Dizziness is described as a sensation like he is going to fall when he is walking. He does not have any propensity to fall in one direction versus another. There is no sense of things spinning and no near syncopal sensation. He denies visual change, nausea, vomiting. He has no difficulty with being off-balance when sitting. He he denies headache and tinnitus. He has not had similar symptoms in the past. He has not tried anything to improve his symptoms.  Past Medical History  Diagnosis Date  . Hypertension   . Dysuria   . UTI (urinary tract infection)   . Tuberculosis     h/o PPD +  . BPH (benign prostatic hyperplasia)   . Anemia     normal Fe, nl B12, nl retic, nl EPO July '13  . Chronic kidney disease     CKD III, obstructive nephropathy  . Elevated PSA, greater than or equal to 20 ng/ml June '13    PSA 107  . Chronic back pain    Past Surgical History  Procedure Laterality Date  . Spleenectomy     Family History  Problem Relation Age of Onset  . Diabetes Mother   . Stroke Brother   . Hearing loss Brother    History  Substance Use Topics  . Smoking status: Former Smoker    Types: Cigarettes  . Smokeless tobacco: Never Used  . Alcohol Use: No    Review of Systems  All other systems reviewed and are negative.    Allergies  Review of patient's allergies indicates no known allergies.  Home Medications   Current Outpatient Rx  Name  Route  Sig  Dispense  Refill  . amLODipine (NORVASC) 5 MG tablet  Oral   Take 1 tablet (5 mg total) by mouth daily.   90 tablet   1   . aspirin 81 MG tablet   Oral   Take 81 mg by mouth daily.         . finasteride (PROSCAR) 5 MG tablet   Oral   Take 1 tablet (5 mg total) by mouth daily.   90 tablet   1   . gabapentin (NEURONTIN) 100 MG capsule   Oral   Take 1 capsule (100 mg total) by mouth 3 (three) times daily.   90 capsule   3   . labetalol (NORMODYNE) 200 MG tablet   Oral   Take 200 mg by mouth daily.          . tamsulosin (FLOMAX) 0.4 MG CAPS   Oral   Take 2 capsules (0.8 mg total) by mouth daily after supper.   90 capsule   1    BP 132/92  Pulse 75  Temp(Src) 98.3 F (36.8 C) (Oral)  Resp 18  Ht 6\' 2"  (1.88 m)  Wt 247 lb (112.038 kg)  BMI 31.7 kg/m2  SpO2 98% Physical Exam  Nursing note and vitals reviewed.  62 year old male, resting comfortably and in no acute distress. Vital signs are significant for hypertension with blood pressure 132/92. Oxygen  saturation is 98%, which is normal. Head is normocephalic and atraumatic. PERRLA, EOMI. Oropharynx is clear. Arcus senilis is present. Fundi show mild arteriolar narrowing but no hemorrhage or exudate or papilledema. Neck is nontender and supple without adenopathy or JVD. No carotid bruits are present. Back is nontender and there is no CVA tenderness. Lungs are clear without rales, wheezes, or rhonchi. Chest is nontender. Heart has regular rate and rhythm without murmur. Abdomen is soft, flat, nontender without masses or hepatosplenomegaly and peristalsis is normoactive. Extremities have no cyanosis or edema, full range of motion is present. Skin is warm and dry without rash. Neurologic: Mental status is normal, cranial nerves are intact, there are no motor or sensory deficits. Cerebellar is intact to finger to nose testing. Romberg shows mild unsteadiness without any directionality. Gait appears normal.  ED Course  Procedures (including critical care time) Results  for orders placed during the hospital encounter of 01/30/13  URINALYSIS, ROUTINE W REFLEX MICROSCOPIC      Result Value Range   Color, Urine YELLOW  YELLOW   APPearance CLOUDY (*) CLEAR   Specific Gravity, Urine 1.013  1.005 - 1.030   pH 5.5  5.0 - 8.0   Glucose, UA NEGATIVE  NEGATIVE mg/dL   Hgb urine dipstick MODERATE (*) NEGATIVE   Bilirubin Urine NEGATIVE  NEGATIVE   Ketones, ur NEGATIVE  NEGATIVE mg/dL   Protein, ur NEGATIVE  NEGATIVE mg/dL   Urobilinogen, UA 0.2  0.0 - 1.0 mg/dL   Nitrite NEGATIVE  NEGATIVE   Leukocytes, UA LARGE (*) NEGATIVE  CBC WITH DIFFERENTIAL      Result Value Range   WBC 6.6  4.0 - 10.5 K/uL   RBC 4.30  4.22 - 5.81 MIL/uL   Hemoglobin 12.5 (*) 13.0 - 17.0 g/dL   HCT 16.1 (*) 09.6 - 04.5 %   MCV 86.0  78.0 - 100.0 fL   MCH 29.1  26.0 - 34.0 pg   MCHC 33.8  30.0 - 36.0 g/dL   RDW 40.9  81.1 - 91.4 %   Platelets 247  150 - 400 K/uL   Neutrophils Relative % 38 (*) 43 - 77 %   Neutro Abs 2.5  1.7 - 7.7 K/uL   Lymphocytes Relative 51 (*) 12 - 46 %   Lymphs Abs 3.4  0.7 - 4.0 K/uL   Monocytes Relative 7  3 - 12 %   Monocytes Absolute 0.5  0.1 - 1.0 K/uL   Eosinophils Relative 4  0 - 5 %   Eosinophils Absolute 0.2  0.0 - 0.7 K/uL   Basophils Relative 1  0 - 1 %   Basophils Absolute 0.1  0.0 - 0.1 K/uL  BASIC METABOLIC PANEL      Result Value Range   Sodium 139  135 - 145 mEq/L   Potassium 3.2 (*) 3.5 - 5.1 mEq/L   Chloride 100  96 - 112 mEq/L   CO2 29  19 - 32 mEq/L   Glucose, Bld 103 (*) 70 - 99 mg/dL   BUN 32 (*) 6 - 23 mg/dL   Creatinine, Ser 7.82 (*) 0.50 - 1.35 mg/dL   Calcium 9.0  8.4 - 95.6 mg/dL   GFR calc non Af Amer 27 (*) >90 mL/min   GFR calc Af Amer 31 (*) >90 mL/min  URINE MICROSCOPIC-ADD ON      Result Value Range   Squamous Epithelial / LPF RARE  RARE   WBC, UA 21-50  <3 WBC/hpf   RBC /  HPF 11-20  <3 RBC/hpf   Bacteria, UA MANY (*) RARE  POCT I-STAT TROPONIN I      Result Value Range   Troponin i, poc 0.01  0.00 - 0.08  ng/mL   Comment 3            Ct Head Wo Contrast  01/31/2013   *RADIOLOGY REPORT*  Clinical Data: Dizziness, hypertension  CT HEAD WITHOUT CONTRAST  Technique:  Contiguous axial images were obtained from the base of the skull through the vertex without contrast.  Comparison: None  Findings:  Mild atrophy with diffuse sulcal prominence.  Scattered periventricular hypodensities suggesting microvascular ischemic disease.  Given background parenchymal abnormalities, there is no CT evidence of acute large territory infarct.  No intraparenchymal or extra-axial mass or hemorrhage.  Intracranial atherosclerosis. There is possible mild ectasia of the basilar and the bilateral internal carotid arteries at the level of siphons, incompletely evaluated on this noncontrast examination.  Polypoid mucosal thickening within the right maxillary sinus.  Remaining paranasal sinuses are normally aerated. No air fluid levels.  The regional soft tissues are normal.  No displaced calvarial fracture.  IMPRESSION: 1.  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  2.  Possible mild ectasia of the intracranial vasculature, incompletely evaluated on this noncontrast examination.  Further evaluation may be obtained with non emergent MRA as clinically indicated.  3. Polypoid mucosal thickening right maxillary sinus without air fluid level.   Original Report Authenticated By: Tacey Ruiz, MD   Images viewed by me.   Date: 01/30/2013  Rate: 61  Rhythm: normal sinus rhythm  QRS Axis: left  Intervals: normal  ST/T Wave abnormalities: nonspecific ST/T changes  Conduction Disutrbances:left anterior fascicular block and Incomplete right bundle-branch block  Narrative Interpretation: Incomplete right bundle-branch block, left anterior fascicular block, left ventricular hypertrophy, nonspecific ST and T changes. When compared with ECG of 11/08/2011, incomplete right bundle-branch block is now present.   Old EKG Reviewed: changes noted   MDM   1. Urinary tract infection    Subjective dizziness with very little noted on physical exam. Note is made of triage report of 15 mm difference in blood pressure between his right and left arm. I checked his blood pressures bilaterally and found 155/103 in the left arm, and 146/104 in the right arm. This difference is not clinically significant. Workup has been initiated including basic metabolic panel and CT of the head.  Workup is significant only for evidence of urinary tract infection. This may indeed be the cause of his feeling of being unsteady on his feet. He is given a dose of ceftriaxone in the emergency department. He was noted to be able to ambulate without difficulty. He is discharged with a prescription for cephalexin and is to followup with his PCP in one week.    Dione Booze, MD 01/31/13 [phone]

## 2013-01-31 ENCOUNTER — Emergency Department (HOSPITAL_COMMUNITY): Payer: No Typology Code available for payment source

## 2013-01-31 ENCOUNTER — Encounter (HOSPITAL_COMMUNITY): Payer: Self-pay | Admitting: Radiology

## 2013-01-31 LAB — CBC WITH DIFFERENTIAL/PLATELET
Eosinophils Absolute: 0.2 10*3/uL (ref 0.0–0.7)
Eosinophils Relative: 4 % (ref 0–5)
HCT: 37 % — ABNORMAL LOW (ref 39.0–52.0)
Hemoglobin: 12.5 g/dL — ABNORMAL LOW (ref 13.0–17.0)
Lymphs Abs: 3.4 10*3/uL (ref 0.7–4.0)
MCH: 29.1 pg (ref 26.0–34.0)
MCV: 86 fL (ref 78.0–100.0)
Monocytes Absolute: 0.5 10*3/uL (ref 0.1–1.0)
Monocytes Relative: 7 % (ref 3–12)
Platelets: 247 10*3/uL (ref 150–400)
RBC: 4.3 MIL/uL (ref 4.22–5.81)

## 2013-01-31 LAB — BASIC METABOLIC PANEL
BUN: 32 mg/dL — ABNORMAL HIGH (ref 6–23)
Calcium: 9 mg/dL (ref 8.4–10.5)
Creatinine, Ser: 2.44 mg/dL — ABNORMAL HIGH (ref 0.50–1.35)
GFR calc non Af Amer: 27 mL/min — ABNORMAL LOW (ref >90)
Glucose, Bld: 103 mg/dL — ABNORMAL HIGH (ref 70–99)

## 2013-01-31 MED ORDER — LIDOCAINE HCL (PF) 1 % IJ SOLN
INTRAMUSCULAR | Status: AC
  Administered 2013-01-31: 06:00:00
  Filled 2013-01-31: qty 5

## 2013-01-31 MED ORDER — ACETAMINOPHEN 325 MG PO TABS
650.0000 mg | ORAL_TABLET | Freq: Once | ORAL | Status: AC
  Administered 2013-01-31: 650 mg via ORAL
  Filled 2013-01-31: qty 2

## 2013-01-31 MED ORDER — CEFTRIAXONE SODIUM 1 G IJ SOLR
1.0000 g | Freq: Once | INTRAMUSCULAR | Status: AC
  Administered 2013-01-31: 1 g via INTRAMUSCULAR
  Filled 2013-01-31: qty 10

## 2013-01-31 MED ORDER — CEPHALEXIN 500 MG PO CAPS: 500.0000 mg | ORAL_CAPSULE | Freq: Four times a day (QID) | ORAL | Status: DC

## 2013-01-31 MED ORDER — POTASSIUM CHLORIDE CRYS ER 20 MEQ PO TBCR
40.0000 meq | EXTENDED_RELEASE_TABLET | Freq: Once | ORAL | Status: AC
  Administered 2013-01-31: 40 meq via ORAL
  Filled 2013-01-31: qty 2

## 2013-01-31 NOTE — ED Notes (Signed)
Pt requesting something mild for pain. Pt states he has pain in both of his arms and in the back of his right leg.

## 2013-01-31 NOTE — ED Notes (Signed)
MD at bedside. 

## 2013-01-31 NOTE — ED Notes (Signed)
Phlebotomy at bedside.

## 2013-01-31 NOTE — ED Notes (Signed)
Pt tolerated ambulation well.

## 2013-02-02 LAB — URINE CULTURE: Colony Count: >=100000

## 2013-05-01 ENCOUNTER — Other Ambulatory Visit: Payer: Self-pay | Admitting: Internal Medicine

## 2013-05-15 ENCOUNTER — Emergency Department (HOSPITAL_COMMUNITY): Payer: No Typology Code available for payment source

## 2013-05-15 ENCOUNTER — Encounter (HOSPITAL_COMMUNITY): Payer: Self-pay | Admitting: Emergency Medicine

## 2013-05-15 ENCOUNTER — Emergency Department (HOSPITAL_COMMUNITY)
Admission: EM | Admit: 2013-05-15 | Discharge: 2013-05-15 | Disposition: A | Discharge status: Home / Self Care | Payer: No Typology Code available for payment source | Attending: Emergency Medicine | Admitting: Emergency Medicine

## 2013-05-15 DIAGNOSIS — Z8611 Personal history of tuberculosis: Secondary | ICD-10-CM | POA: Insufficient documentation

## 2013-05-15 DIAGNOSIS — I129 Hypertensive chronic kidney disease with stage 1 through stage 4 chronic kidney disease, or unspecified chronic kidney disease: Secondary | ICD-10-CM | POA: Insufficient documentation

## 2013-05-15 DIAGNOSIS — M25579 Pain in unspecified ankle and joints of unspecified foot: Secondary | ICD-10-CM | POA: Insufficient documentation

## 2013-05-15 DIAGNOSIS — Z79899 Other long term (current) drug therapy: Secondary | ICD-10-CM | POA: Insufficient documentation

## 2013-05-15 DIAGNOSIS — E876 Hypokalemia: Secondary | ICD-10-CM | POA: Insufficient documentation

## 2013-05-15 DIAGNOSIS — N400 Enlarged prostate without lower urinary tract symptoms: Secondary | ICD-10-CM | POA: Insufficient documentation

## 2013-05-15 DIAGNOSIS — M79609 Pain in unspecified limb: Secondary | ICD-10-CM | POA: Insufficient documentation

## 2013-05-15 DIAGNOSIS — Z862 Personal history of diseases of the blood and blood-forming organs and certain disorders involving the immune mechanism: Secondary | ICD-10-CM | POA: Insufficient documentation

## 2013-05-15 DIAGNOSIS — Z792 Long term (current) use of antibiotics: Secondary | ICD-10-CM | POA: Insufficient documentation

## 2013-05-15 DIAGNOSIS — M7989 Other specified soft tissue disorders: Secondary | ICD-10-CM

## 2013-05-15 DIAGNOSIS — Z87440 Personal history of urinary (tract) infections: Secondary | ICD-10-CM | POA: Insufficient documentation

## 2013-05-15 DIAGNOSIS — G8929 Other chronic pain: Secondary | ICD-10-CM | POA: Insufficient documentation

## 2013-05-15 DIAGNOSIS — M79672 Pain in left foot: Secondary | ICD-10-CM

## 2013-05-15 DIAGNOSIS — Z7982 Long term (current) use of aspirin: Secondary | ICD-10-CM | POA: Insufficient documentation

## 2013-05-15 DIAGNOSIS — M79605 Pain in left leg: Secondary | ICD-10-CM

## 2013-05-15 DIAGNOSIS — I8390 Asymptomatic varicose veins of unspecified lower extremity: Secondary | ICD-10-CM | POA: Insufficient documentation

## 2013-05-15 DIAGNOSIS — Z87891 Personal history of nicotine dependence: Secondary | ICD-10-CM | POA: Insufficient documentation

## 2013-05-15 DIAGNOSIS — N183 Chronic kidney disease, stage 3 unspecified: Secondary | ICD-10-CM | POA: Insufficient documentation

## 2013-05-15 LAB — CBC WITH DIFFERENTIAL/PLATELET
Basophils Absolute: 0.1 10*3/uL (ref 0.0–0.1)
Eosinophils Absolute: 0.1 10*3/uL (ref 0.0–0.7)
HCT: 38 % — ABNORMAL LOW (ref 39.0–52.0)
Hemoglobin: 12.6 g/dL — ABNORMAL LOW (ref 13.0–17.0)
Lymphocytes Relative: 33 % (ref 12–46)
MCV: 85.2 fL (ref 78.0–100.0)
Monocytes Absolute: 0.7 10*3/uL (ref 0.1–1.0)
Monocytes Relative: 9 % (ref 3–12)
Neutro Abs: 4.5 10*3/uL (ref 1.7–7.7)
Neutrophils Relative %: 56 % (ref 43–77)
WBC: 8.1 10*3/uL (ref 4.0–10.5)

## 2013-05-15 LAB — BASIC METABOLIC PANEL
BUN: 33 mg/dL — ABNORMAL HIGH (ref 6–23)
CO2: 31 mEq/L (ref 19–32)
Chloride: 97 mEq/L (ref 96–112)
Creatinine, Ser: 2.19 mg/dL — ABNORMAL HIGH (ref 0.50–1.35)
GFR calc non Af Amer: 30 mL/min — ABNORMAL LOW (ref >90)
Potassium: 3 mEq/L — ABNORMAL LOW (ref 3.5–5.1)

## 2013-05-15 MED ORDER — POTASSIUM CHLORIDE CRYS ER 20 MEQ PO TBCR
40.0000 meq | EXTENDED_RELEASE_TABLET | Freq: Once | ORAL | Status: AC
  Administered 2013-05-15: 40 meq via ORAL
  Filled 2013-05-15: qty 2

## 2013-05-15 NOTE — ED Provider Notes (Signed)
CSN: [id number]     Arrival date & time 05/15/13  1444 History   First MD Initiated Contact with Patient 05/15/13 1459    This chart was scribed for Sharilyn Sites PA-C, a non-physician practitioner working with Hurman Horn, MD by Lewanda Rife, ED Scribe. This patient was seen in room WTR8/WTR8 and the patient's care was started at 3:18 PM   Chief Complaint  Patient presents with  . Leg Pain   (Consider location/radiation/quality/duration/timing/severity/associated sxs/prior Treatment) The history is provided by the patient. No language interpreter was used.   HPI Comments: [name] is a 62 y.o. male who presents to the Emergency Department complaining of constant worsening left foot pain radiating to left calf onset 7 days. Describes pain as moderate in severity, more like a "tightness" and "muscle tension". Reports associated distal LLE swelling. Reports pain is exacerbated by touch and walking. Denies any alleviating factors. Denies recent injury, trauma, or falls. Denies PMHx of gout. Denies recent long travel or extended immobility, prior history of DVT or PE, hemoptysis, or active cancer.   Past Medical History  Diagnosis Date  . Hypertension   . Dysuria   . UTI (urinary tract infection)   . Tuberculosis     h/o PPD +  . BPH (benign prostatic hyperplasia)   . Anemia     normal Fe, nl B12, nl retic, nl EPO July '13  . Chronic kidney disease     CKD III, obstructive nephropathy  . Elevated PSA, greater than or equal to 20 ng/ml June '13    PSA 107  . Chronic back pain    Past Surgical History  Procedure Laterality Date  . Spleenectomy     Family History  Problem Relation Age of Onset  . Diabetes Mother   . Stroke Brother   . Hearing loss Brother    History  Substance Use Topics  . Smoking status: Former Smoker    Types: Cigarettes  . Smokeless tobacco: Never Used  . Alcohol Use: No    Review of Systems  Constitutional: Negative for fever.   Cardiovascular: Positive for leg swelling.  Musculoskeletal:       Left foot pain   Psychiatric/Behavioral: Negative for confusion.  A complete 10 system review of systems was obtained and all systems are negative except as noted in the HPI and PMHx.     Allergies  Review of patient's allergies indicates no known allergies.  Home Medications   Current Outpatient Rx  Name  Route  Sig  Dispense  Refill  . amLODipine (NORVASC) 5 MG tablet      TAKE 1 TABLET DAILY   90 tablet   3   . aspirin 81 MG tablet   Oral   Take 81 mg by mouth daily.         . cephALEXin (KEFLEX) 500 MG capsule   Oral   Take 1 capsule (500 mg total) by mouth 4 (four) times daily.   40 capsule   0   . finasteride (PROSCAR) 5 MG tablet      TAKE 1 TABLET DAILY   90 tablet   3   . gabapentin (NEURONTIN) 100 MG capsule   Oral   Take 1 capsule (100 mg total) by mouth 3 (three) times daily.   90 capsule   3   . EXPIRED: labetalol (NORMODYNE) 200 MG tablet   Oral   Take 200 mg by mouth daily.          Marland Kitchen  tamsulosin (FLOMAX) 0.4 MG CAPS   Oral   Take 2 capsules (0.8 mg total) by mouth daily after supper.   90 capsule   1    BP 142/105  Pulse 86  Temp(Src) 97.9 F (36.6 C) (Oral)  Resp 14  SpO2 98%  Physical Exam  Nursing note and vitals reviewed. Constitutional: He is oriented to person, place, and time. He appears well-developed and well-nourished.  HENT:  Head: Normocephalic and atraumatic.  Mouth/Throat: Oropharynx is clear and moist.  Eyes: Conjunctivae and EOM are normal. Pupils are equal, round, and reactive to light.  Neck: Normal range of motion.  Cardiovascular: Normal rate, regular rhythm and normal heart sounds.   Pulmonary/Chest: Effort normal and breath sounds normal. No respiratory distress. He has no wheezes.  Musculoskeletal: Normal range of motion.       Right foot: He exhibits tenderness, bony tenderness and swelling. He exhibits normal range of motion, normal  capillary refill, no crepitus, no deformity and no laceration.       Feet:  TTP and mild swelling of left lateral foot; no bruising, deformity, or visible signs of trauma; overlying skin cool to touch without erythema or signs of cellulitis 1+ pitting edema of BLE Varicose veins of left posterior calf with some TTP along lateral aspect; negative Homan's sign; strong distal pulse and cap refill; sensation intact  Neurological: He is alert and oriented to person, place, and time.  Skin: Skin is warm and dry.  Psychiatric: He has a normal mood and affect.    ED Course  Procedures (including critical care time)  COORDINATION OF CARE:  Nursing notes reviewed. Vital signs reviewed. Initial pt interview and examination performed.   3:25 PM-Discussed work up plan with pt at bedside, which includes doppler study of LLE, x-ray of left foot, BMP, and CBC with diff panel. Pt agrees with plan.  4:43 PM Nursing Notes Reviewed/ Care Coordinated Applicable Imaging Reviewed  Interpretation of Laboratory Data incorporated into ED treatment Discussed results and treatment plan with pt. Pt demonstrates understanding and agrees with plan.  Treatment plan initiated: Medications  potassium chloride SA (K-DUR,KLOR-CON) CR tablet 40 mEq (not administered)   Preliminary report: Left: No evidence of DVT, superficial thrombosis, or Baker's cyst.  Labs Review Labs Reviewed  CBC WITH DIFFERENTIAL - Abnormal; Notable for the following:    Hemoglobin 12.6 (*)    HCT 38.0 (*)    All other components within normal limits  BASIC METABOLIC PANEL - Abnormal; Notable for the following:    Potassium 3.0 (*)    Glucose, Bld 119 (*)    BUN 33 (*)    Creatinine, Ser 2.19 (*)    GFR calc non Af Amer 30 (*)    GFR calc Af Amer 35 (*)    All other components within normal limits   Imaging Review Dg Foot Complete Left  05/15/2013   CLINICAL DATA:  Left foot pain, no known injury  EXAM: LEFT FOOT - COMPLETE 3+ VIEW   COMPARISON:  None.  FINDINGS: Three views of the left foot submitted. No acute fracture or subluxation. Small plantar and posterior spur of calcaneus.  IMPRESSION: No acute fracture or subluxation. Small plantar and posterior spur of calcaneus.   Electronically Signed   By: Natasha Mead M.D.   On: 05/15/2013 16:08    EKG Interpretation   None       MDM   1. Leg pain, left   2. Hypokalemia   3. Foot pain,  left    X-ray negative for acute findings.  LE duplex negative for DVT.  Renal function at baseline, mild hypokalemia at 3.0-- replaced in the ED.  Sx and presentation not consistent with gout.  Pt instructed to continue taking his anti-inflammatories and gabapentin.  He will FU with his PCP and discuss this ED visit.  Discussed plan with pt, he agreed.  Return precautions advised.  I personally performed the services described in this documentation, which was scribed in my presence. The recorded information has been reviewed and is accurate.  Garlon Hatchet, PA-C 05/15/13 1750

## 2013-05-15 NOTE — Progress Notes (Signed)
VASCULAR LAB PRELIMINARY  PRELIMINARY  PRELIMINARY  PRELIMINARY  Left lower extremity venous duplex completed.    Preliminary report:  Left:  No evidence of DVT, superficial thrombosis, or Baker's cyst.  [name], RVT 05/15/2013, 4:26 PM

## 2013-05-15 NOTE — ED Notes (Addendum)
Pt from home c/o L foot swelling x3 days. Pt denies injury, SOB, CP. Pt is A&O and in NAD Pt has hx of chronic kidney disease with only "30%" function

## 2013-05-17 NOTE — ED Provider Notes (Signed)
Medical screening examination/treatment/procedure(s) were performed by non-physician practitioner and as supervising physician I was immediately available for consultation/collaboration.  Grover Woodfield M Alica Shellhammer, MD 05/17/13 1639 

## 2013-05-21 ENCOUNTER — Encounter: Payer: Self-pay | Admitting: Internal Medicine

## 2013-05-21 ENCOUNTER — Ambulatory Visit (INDEPENDENT_AMBULATORY_CARE_PROVIDER_SITE_OTHER): Payer: No Typology Code available for payment source | Admitting: Internal Medicine

## 2013-05-21 VITALS — BP 128/100 | HR 61 | Temp 97.0°F | Wt 259.0 lb

## 2013-05-21 DIAGNOSIS — I10 Essential (primary) hypertension: Secondary | ICD-10-CM

## 2013-05-21 DIAGNOSIS — G609 Hereditary and idiopathic neuropathy, unspecified: Secondary | ICD-10-CM

## 2013-05-21 DIAGNOSIS — N182 Chronic kidney disease, stage 2 (mild): Secondary | ICD-10-CM

## 2013-05-21 MED ORDER — LABETALOL HCL 200 MG PO TABS: 200.0000 mg | ORAL_TABLET | Freq: Every morning | ORAL | Status: DC

## 2013-05-21 MED ORDER — GABAPENTIN 100 MG PO CAPS: 100.0000 mg | ORAL_CAPSULE | Freq: Three times a day (TID) | ORAL | Status: DC

## 2013-05-21 NOTE — Progress Notes (Signed)
Pre visit review using our clinic review tool, if applicable. No additional management support is needed unless otherwise documented below in the visit note. 

## 2013-05-21 NOTE — Patient Instructions (Signed)
Pleasure seeing you today [name]! Hope you foot begins to fill better.   1) We are going to restart you on gabapentin 100 mg three times a day to help with your foot pain. Hopefully, this will help correct the pain you are feeling in your left foot.   Try elevating your legs when you can at home to improve your swelling. You can try wearing knee-high support elastic stockings to help with the swelling in your legs.   We refilled your labetalol and gabapentin.

## 2013-05-21 NOTE — Progress Notes (Signed)
Subjective:     Patient ID: [name], male   DOB: 06/24/1949, 62 y.o.   MRN: [id number]  HPI [name] is a 62 yo with PMH of cardiomyopathy, chronic renal insufficieny, and HTN who presents today for follow up on his ED visit last week on 12/9 for L left foot and calf pain.   In the ED last week, he had pain extending from the back of his thigh to the front of his foot. The pain was a constant throbbing pain. Both of his legs are affected, but his L leg causes most of his pain.  The top of his L foot was swollen last week. In the ED, they took an x-ray and advised patient to continue to take NSAIDs and gabapentin for his pain. His swelling is slightly improved today. The pain is now manageable, but still present. He reports a dull ache present now. The pain can be achy or occassionally throbbing. He does limp. When he sits down for awhile, fluid builds up in his legs. His left leg swells more than his R leg. Elevating his legs improves his symptoms. He has been out of gabapentin for a month. He requests a refill on this medication.   Xray L Foot 05/15/2013:  IMPRESSION: No acute fracture or subluxation. Small plantar and posterior spur of calcaneus.  Past Medical History  Diagnosis Date  . Hypertension   . Dysuria   . UTI (urinary tract infection)   . Tuberculosis     h/o PPD +  . BPH (benign prostatic hyperplasia)   . Anemia     normal Fe, nl B12, nl retic, nl EPO July '13  . Chronic kidney disease     CKD III, obstructive nephropathy  . Elevated PSA, greater than or equal to 20 ng/ml June '13    PSA 107  . Chronic back pain    Past Surgical History  Procedure Laterality Date  . Spleenectomy     Family History  Problem Relation Age of Onset  . Diabetes Mother   . Stroke Brother   . Hearing loss Brother    History   Social History  . Marital Status: Married    Spouse Name: N/A    Number of Children: 2  . Years of Education: 20   Occupational History  . 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 Smoking status: Former Smoker    Types: Cigarettes  . Smokeless tobacco: Never Used  . Alcohol Use: No  . Drug Use: No  . Sexual Activity: No   Other Topics Concern  . Not on file   Social History Narrative   Native of [place], raised poor farming community. attendedd gymnasium. To [place] '78 - finished College, all but Occupational hygienist. Married - wife in [place], blocked from immigrating to [place]. 1 son '90; 1 dtr '93. 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 Resources depleted.    Current Outpatient Prescriptions on File Prior to Visit  Medication Sig Dispense Refill  . amLODipine (NORVASC) 5 MG tablet TAKE 1 TABLET DAILY  90 tablet  3  . aspirin 81 MG tablet Take 81 mg by mouth daily.      . finasteride (PROSCAR) 5 MG tablet TAKE 1 TABLET DAILY  90 tablet  3  . tamsulosin (FLOMAX) 0.4 MG CAPS Take 2 capsules (0.8 mg total) by mouth daily after supper.  90 capsule  1  . labetalol (  NORMODYNE) 200 MG tablet Take 200 mg by mouth daily.       . naproxen sodium (ANAPROX) 220 MG tablet Take 440 mg by mouth 2 (two) times daily with a meal.       No current facility-administered medications on file prior to visit.     Review of Systems System review is negative for any constitutional, cardiac, pulmonary, GI or neuro symptoms or complaints other than as described in the HPI. .     Objective:   Physical Exam Gen: Well-appearing gentleman in no acute distress.  HEENT: Atraumatic. Antiicteric sclerae.  CV: Regular rate and rhythm. Holosystolic murmur best hear at RUSB.  Pulm: Lungs clear to auscultation bilaterally. No crackles, rales or wheezes. Normal work of breathing. Abd: Soft, non-distended and non-tender to palpation. Ext: 2+ Pitting edema from ankle up to L knee. 1+ Pitting edema from ankle to R Knee.  Neuro: Intact sensation to pinprick and light touch in bilateral feet. Decreased vibration  sensation in L foot. Vibration sense intact at base of L ankle. Normal vibration sense in R foot.      Assessment and Plan:     [name] is a 62 yo with PMH of cardiomyopathy, chronic renal insufficieny, and HTN who presents today for follow up on his ED visit last week on 12/9 for L left foot and calf pain.   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  Plan: Re-start patient on gabapentin 100 mg TID.   2) Chronic renal insufficiency: Patient has foley in place. Patient is against surgery at this time. He is planning to have a trial to test if he can urinate without the catheter next week.  3) HTN: [name] blood pressure was elevated in the office today 128/100.  Plan: Continue labetelol. No changes in his regimen at this time.

## 2013-05-22 NOTE — Assessment & Plan Note (Signed)
Chronic renal insufficiency: Patient has foley in place. Patient is against surgery at this time. He is planning to have a trial to test if he can urinate without the catheter next week.

## 2013-05-22 NOTE — Assessment & Plan Note (Signed)
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  Plan: Re-start patient on gabapentin 100 mg TID.

## 2013-05-22 NOTE — Assessment & Plan Note (Signed)
BP Readings from Last 3 Encounters:  05/21/13 128/100  05/15/13 141/97  01/31/13 159/96   Mr. Peed blood pressure was elevated in the office today 128/100.  Plan: Continue labetelol. No changes in his regimen at this time.

## 2013-08-02 ENCOUNTER — Encounter (HOSPITAL_COMMUNITY): Payer: Self-pay | Admitting: Emergency Medicine

## 2013-08-02 ENCOUNTER — Emergency Department (HOSPITAL_COMMUNITY)
Admission: EM | Admit: 2013-08-02 | Discharge: 2013-08-02 | Disposition: A | Discharge status: Home / Self Care | Payer: Medicare Other | Attending: Emergency Medicine | Admitting: Emergency Medicine

## 2013-08-02 DIAGNOSIS — Z9889 Other specified postprocedural states: Secondary | ICD-10-CM | POA: Insufficient documentation

## 2013-08-02 DIAGNOSIS — R339 Retention of urine, unspecified: Secondary | ICD-10-CM | POA: Diagnosis not present

## 2013-08-02 DIAGNOSIS — Y846 Urinary catheterization as the cause of abnormal reaction of the patient, or of later complication, without mention of misadventure at the time of the procedure: Secondary | ICD-10-CM | POA: Insufficient documentation

## 2013-08-02 DIAGNOSIS — I129 Hypertensive chronic kidney disease with stage 1 through stage 4 chronic kidney disease, or unspecified chronic kidney disease: Secondary | ICD-10-CM | POA: Insufficient documentation

## 2013-08-02 DIAGNOSIS — Z7982 Long term (current) use of aspirin: Secondary | ICD-10-CM | POA: Diagnosis not present

## 2013-08-02 DIAGNOSIS — Z87440 Personal history of urinary (tract) infections: Secondary | ICD-10-CM | POA: Insufficient documentation

## 2013-08-02 DIAGNOSIS — Z791 Long term (current) use of non-steroidal anti-inflammatories (NSAID): Secondary | ICD-10-CM | POA: Diagnosis not present

## 2013-08-02 DIAGNOSIS — Z8611 Personal history of tuberculosis: Secondary | ICD-10-CM | POA: Diagnosis not present

## 2013-08-02 DIAGNOSIS — G8929 Other chronic pain: Secondary | ICD-10-CM | POA: Diagnosis not present

## 2013-08-02 DIAGNOSIS — Z862 Personal history of diseases of the blood and blood-forming organs and certain disorders involving the immune mechanism: Secondary | ICD-10-CM | POA: Diagnosis not present

## 2013-08-02 DIAGNOSIS — N400 Enlarged prostate without lower urinary tract symptoms: Secondary | ICD-10-CM | POA: Insufficient documentation

## 2013-08-02 DIAGNOSIS — T83091A Other mechanical complication of indwelling urethral catheter, initial encounter: Secondary | ICD-10-CM | POA: Insufficient documentation

## 2013-08-02 DIAGNOSIS — N183 Chronic kidney disease, stage 3 unspecified: Secondary | ICD-10-CM | POA: Insufficient documentation

## 2013-08-02 DIAGNOSIS — Z87891 Personal history of nicotine dependence: Secondary | ICD-10-CM | POA: Insufficient documentation

## 2013-08-02 DIAGNOSIS — R109 Unspecified abdominal pain: Secondary | ICD-10-CM | POA: Insufficient documentation

## 2013-08-02 DIAGNOSIS — Z79899 Other long term (current) drug therapy: Secondary | ICD-10-CM | POA: Insufficient documentation

## 2013-08-02 DIAGNOSIS — R338 Other retention of urine: Secondary | ICD-10-CM

## 2013-08-02 LAB — CBC WITH DIFFERENTIAL/PLATELET
BASOS PCT: 1 % (ref 0–1)
Basophils Absolute: 0.1 10*3/uL (ref 0.0–0.1)
EOS PCT: 2 % (ref 0–5)
Eosinophils Absolute: 0.1 10*3/uL (ref 0.0–0.7)
HEMATOCRIT: 42.1 % (ref 39.0–52.0)
HEMOGLOBIN: 14.1 g/dL (ref 13.0–17.0)
LYMPHS ABS: 3.5 10*3/uL (ref 0.7–4.0)
Lymphocytes Relative: 52 % — ABNORMAL HIGH (ref 12–46)
MCH: 28.8 pg (ref 26.0–34.0)
MCHC: 33.5 g/dL (ref 30.0–36.0)
MCV: 86.1 fL (ref 78.0–100.0)
MONO ABS: 0.5 10*3/uL (ref 0.1–1.0)
Monocytes Relative: 8 % (ref 3–12)
NEUTROS PCT: 37 % — AB (ref 43–77)
Neutro Abs: 2.5 10*3/uL (ref 1.7–7.7)
Platelets: 274 10*3/uL (ref 150–400)
RBC: 4.89 MIL/uL (ref 4.22–5.81)
RDW: 14.1 % (ref 11.5–15.5)
WBC: 6.7 10*3/uL (ref 4.0–10.5)

## 2013-08-02 LAB — URINE MICROSCOPIC-ADD ON

## 2013-08-02 LAB — URINALYSIS, ROUTINE W REFLEX MICROSCOPIC
Bilirubin Urine: NEGATIVE
GLUCOSE, UA: NEGATIVE mg/dL
Hgb urine dipstick: NEGATIVE
Ketones, ur: NEGATIVE mg/dL
Nitrite: NEGATIVE
PH: 8.5 — AB (ref 5.0–8.0)
Protein, ur: 100 mg/dL — AB
Specific Gravity, Urine: 1.011 (ref 1.005–1.030)
Urobilinogen, UA: 0.2 mg/dL (ref 0.0–1.0)

## 2013-08-02 LAB — COMPREHENSIVE METABOLIC PANEL
ALBUMIN: 3.4 g/dL — AB (ref 3.5–5.2)
ALK PHOS: 90 U/L (ref 39–117)
ALT: 10 U/L (ref 0–53)
AST: 17 U/L (ref 0–37)
BUN: 27 mg/dL — ABNORMAL HIGH (ref 6–23)
CALCIUM: 9.5 mg/dL (ref 8.4–10.5)
CO2: 30 mEq/L (ref 19–32)
CREATININE: 2.4 mg/dL — AB (ref 0.50–1.35)
Chloride: 100 mEq/L (ref 96–112)
GFR calc non Af Amer: 27 mL/min — ABNORMAL LOW (ref >90)
GFR, EST AFRICAN AMERICAN: 31 mL/min — AB (ref >90)
Glucose, Bld: 116 mg/dL — ABNORMAL HIGH (ref 70–99)
POTASSIUM: 4 meq/L (ref 3.7–5.3)
Sodium: 142 mEq/L (ref 137–147)
TOTAL PROTEIN: 7.6 g/dL (ref 6.0–8.3)
Total Bilirubin: 0.5 mg/dL (ref 0.3–1.2)

## 2013-08-02 NOTE — ED Provider Notes (Signed)
CSN: [id number]     Arrival date & time 08/02/13  1454 History   First MD Initiated Contact with Patient 08/02/13 1501     Chief Complaint  Patient presents with  . Urinary Retention     HPI Patient p/w concern of lower abd pain / decreased urine production. Sx began today, with no clear precipitant. Since onset there has been mild / sore pain in the lower abd. Pain is constant w no clear alleviating / exacerbating factors. There is concurrent lack of urine production through the patient's chronic indwelling catheter. 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 No other current complaints.   Past Medical History  Diagnosis Date  . Hypertension   . Dysuria   . UTI (urinary tract infection)   . Tuberculosis     h/o PPD +  . BPH (benign prostatic hyperplasia)   . Anemia     normal Fe, nl B12, nl retic, nl EPO July '13  . Chronic kidney disease     CKD III, obstructive nephropathy  . Elevated PSA, greater than or equal to 20 ng/ml June '13    PSA 107  . Chronic back pain    Past Surgical History  Procedure Laterality Date  . Spleenectomy     Family History  Problem Relation Age of Onset  . Diabetes Mother   . Stroke Brother   . Hearing loss Brother    History  Substance Use Topics  . Smoking status: Former Smoker    Types: Cigarettes  . Smokeless tobacco: Never Used  . Alcohol Use: No    Review of Systems  Constitutional:       Per HPI, otherwise negative  HENT:       Per HPI, otherwise negative  Respiratory:       Per HPI, otherwise negative  Cardiovascular:       Per HPI, otherwise negative  Gastrointestinal: Negative for vomiting.  Endocrine:       Negative aside from HPI  Genitourinary:       Neg aside from HPI   Musculoskeletal:       Per HPI, otherwise negative  Skin: Negative.   Neurological: Negative for syncope.      Allergies  Review of patient's allergies indicates no known allergies.  Home Medications   Current Outpatient Rx   Name  Route  Sig  Dispense  Refill  . amLODipine (NORVASC) 5 MG tablet      TAKE 1 TABLET DAILY   90 tablet   3   . aspirin 81 MG tablet   Oral   Take 81 mg by mouth daily.         Marland Kitchen gabapentin (NEURONTIN) 100 MG capsule   Oral   Take 1 capsule (100 mg total) by mouth 3 (three) times daily.   270 capsule   3   . EXPIRED: labetalol (NORMODYNE) 200 MG tablet   Oral   Take 200 mg by mouth daily.          Marland Kitchen labetalol (NORMODYNE) 200 MG tablet   Oral   Take 1 tablet (200 mg total) by mouth every morning.   90 tablet   3   . naproxen sodium (ANAPROX) 220 MG tablet   Oral   Take 440 mg by mouth 2 (two) times daily with a meal.         . tamsulosin (FLOMAX) 0.4 MG CAPS   Oral   Take 2 capsules (0.8 mg total) by mouth  daily after supper.   90 capsule   1    BP 178/100  Pulse 66  Temp(Src) 97.7 F (36.5 C) (Oral)  Resp 18  SpO2 97% Physical Exam  Nursing note and vitals reviewed. Constitutional: He is oriented to person, place, and time. He appears well-developed. No distress.  HENT:  Head: Normocephalic and atraumatic.  Eyes: Conjunctivae and EOM are normal.  Cardiovascular: Normal rate and regular rhythm.   Pulmonary/Chest: Effort normal. No stridor. No respiratory distress.  Abdominal: He exhibits no distension.  Minimal TTP about the suprapubic area.  No peritoneal findings.  Genitourinary:  Foley catheter w minimal drainage. Bladder scan demonstrates >216ml in bladder.  Musculoskeletal: He exhibits no edema.  Neurological: He is alert and oriented to person, place, and time.  Skin: Skin is warm and dry.  Psychiatric: He has a normal mood and affect.    ED Course  BLADDER CATHETERIZATION Date/Time: 08/02/2013 5:15 PM Performed by: Carmin Muskrat Authorized by: Carmin Muskrat Consent: Verbal consent obtained. The procedure was performed in an emergent situation. Risks and benefits: risks, benefits and alternatives were discussed Consent given  by: patient Patient understanding: patient states understanding of the procedure being performed Patient consent: the patient's understanding of the procedure matches consent given Procedure consent: procedure consent matches procedure scheduled Relevant documents: relevant documents present and verified Test results: test results available and properly labeled Site marked: the operative site was marked Imaging studies: imaging studies available Required items: required blood products, implants, devices, and special equipment available Patient identity confirmed: verbally with patient Time out: Immediately prior to procedure a "time out" was called to verify the correct patient, procedure, equipment, support staff and site/side marked as required. Indications: urinary retention Local anesthesia used: no Patient sedated: no Preparation: Patient was prepped and draped in the usual sterile fashion. Catheter insertion: indwelling Catheter size: 16 Fr Complicated insertion: no Altered anatomy: no Bladder irrigation: no Number of attempts: 1 Urine characteristics: clear and yellow Patient tolerance: Patient tolerated the procedure well with no immediate complications.   (including critical care time) Labs Review Labs Reviewed  CBC WITH DIFFERENTIAL  COMPREHENSIVE METABOLIC PANEL  URINALYSIS, ROUTINE W REFLEX MICROSCOPIC   Imaging Review Bladder scan w >248ml in bladder.  After the initial eval we attempted to irrigate the bladder.  No success on three attempts.  Catheter will be changed out.   Update: Patient substantially better.  Urine flowing freely.  We discussed return precautions, followup instructions.  Patient will have teaching for catheter irrigation performed prior to discharge.  Urine culture pending. MDM   Final diagnoses:  Acute urinary retention   Patient presents with acute retention.  Notably the patient a chronic Foley catheter.  Patient is in no distress,  with no fever, stable vital signs.  The patient's catheter was changed due to inability to irrigate the catheter.  Subsequently the patient had free-flowing urine, no new complaints, no evidence of distress or infection.  Patient was discharged to follow up with urology.      Carmin Muskrat, MD 08/02/13 [phone]

## 2013-08-02 NOTE — ED Notes (Signed)
Bed: WT88 Expected date:  Expected time:  Means of arrival:  Comments: EMS-urinary retention

## 2013-08-02 NOTE — ED Notes (Addendum)
Per EMS, Pt, from home, c/o urinary retention.  Pt reports indwelling urinary catheter due to renal insufficiency.  Sts he emptied bag prior to going to bed and woke up w/ "very little" urine in bag.  Pt sts he is starting to feel distended.

## 2013-08-02 NOTE — ED Notes (Signed)
Unable to irrigate foley. Foley is obstructed.

## 2013-08-02 NOTE — Discharge Instructions (Signed)
As discussed, it is important that you follow up as soon as possible with your physician for continued management of your condition. ° °If you develop any new, or concerning changes in your condition, please return to the emergency department immediately. ° ° °Acute Urinary Retention, Male °Acute urinary retention is the temporary inability to urinate. °This is a common problem in older men. As men age their prostates become larger and block the flow of urine from the bladder. This is usually a problem that has come on gradually.  °HOME CARE INSTRUCTIONS °If you are sent home with a Foley catheter and a drainage system, you will need to discuss the best course of action with your health care provider. While the catheter is in, maintain a good intake of fluids. Keep the drainage bag emptied and lower than your catheter. This is so that contaminated urine will not flow back into your bladder, which could lead to a urinary tract infection. °There are two main types of drainage bags. One is a large bag that usually is used at night. It has a good capacity that will allow you to sleep through the night without having to empty it. The second type is called a leg bag. It has a smaller capacity, so it needs to be emptied more frequently. However, the main advantage is that it can be attached by a leg strap and can go underneath your clothing, allowing you the freedom to move about or leave your home. °Only take over-the-counter or prescription medicines for pain, discomfort, or fever as directed by your health care provider.  °SEEK MEDICAL CARE IF: °· You develop a low-grade fever. °· You experience spasms or leakage of urine with the spasms. °SEEK IMMEDIATE MEDICAL CARE IF:  °· You develop chills or fever. °· Your catheter stops draining urine. °· Your catheter falls out. °· You start to develop increased bleeding that does not respond to rest and increased fluid intake. °MAKE SURE YOU: °· Understand these  instructions. °· Will watch your condition. °· Will get help right away if you are not doing well or get worse. °Document Released: 08/30/2000 Document Revised: 01/24/2013 Document Reviewed: 11/02/2012 °[employer]® Patient Information ©2014 [employer]. ° °

## 2013-08-21 ENCOUNTER — Emergency Department (HOSPITAL_COMMUNITY)
Admission: EM | Admit: 2013-08-21 | Discharge: 2013-08-21 | Disposition: A | Discharge status: Home / Self Care | Payer: Medicare Other | Attending: Emergency Medicine | Admitting: Emergency Medicine

## 2013-08-21 ENCOUNTER — Encounter (HOSPITAL_COMMUNITY): Payer: Self-pay | Admitting: Emergency Medicine

## 2013-08-21 DIAGNOSIS — N400 Enlarged prostate without lower urinary tract symptoms: Secondary | ICD-10-CM | POA: Insufficient documentation

## 2013-08-21 DIAGNOSIS — Z7982 Long term (current) use of aspirin: Secondary | ICD-10-CM | POA: Diagnosis not present

## 2013-08-21 DIAGNOSIS — Z79899 Other long term (current) drug therapy: Secondary | ICD-10-CM | POA: Insufficient documentation

## 2013-08-21 DIAGNOSIS — Z87891 Personal history of nicotine dependence: Secondary | ICD-10-CM | POA: Insufficient documentation

## 2013-08-21 DIAGNOSIS — R109 Unspecified abdominal pain: Secondary | ICD-10-CM | POA: Insufficient documentation

## 2013-08-21 DIAGNOSIS — Z466 Encounter for fitting and adjustment of urinary device: Secondary | ICD-10-CM | POA: Insufficient documentation

## 2013-08-21 DIAGNOSIS — M549 Dorsalgia, unspecified: Secondary | ICD-10-CM | POA: Diagnosis not present

## 2013-08-21 DIAGNOSIS — N189 Chronic kidney disease, unspecified: Secondary | ICD-10-CM | POA: Diagnosis not present

## 2013-08-21 DIAGNOSIS — T839XXA Unspecified complication of genitourinary prosthetic device, implant and graft, initial encounter: Secondary | ICD-10-CM

## 2013-08-21 DIAGNOSIS — Z8611 Personal history of tuberculosis: Secondary | ICD-10-CM | POA: Insufficient documentation

## 2013-08-21 DIAGNOSIS — G8929 Other chronic pain: Secondary | ICD-10-CM | POA: Insufficient documentation

## 2013-08-21 NOTE — ED Provider Notes (Signed)
CSN: [id number]     Arrival date & time 08/21/13  1043 History   First MD Initiated Contact with Patient 08/21/13 1102     Chief Complaint  Patient presents with  . Foley Catheter Clogged      (Consider location/radiation/quality/duration/timing/severity/associated sxs/prior Treatment) HPI Comments: Catheter clogged. Had catheter for 2 years. Tried to unclog at home but it didn't work. Had changed 3 weeks ago here.  Patient is a 63 y.o. male presenting with male genitourinary complaint. The history is provided by the patient.  Male GU Problem Presenting symptoms: no dysuria, no penile discharge, no penile pain and no scrotal pain   Presenting symptoms comment:  Clogged catheter Context: spontaneously   Relieved by:  Nothing Worsened by:  Nothing tried Ineffective treatments: unclogging kit at home. Associated symptoms: abdominal pain (suprapubic with abdominal distention) and urinary retention   Associated symptoms: no diarrhea, no fever, no nausea, no urinary incontinence (clogged catheter) and no vomiting   Abdominal pain:    Location:  Suprapubic   Quality:  Aching   Severity:  Mild   Onset quality:  Gradual   Timing:  Constant   Progression:  Worsening   Chronicity:  Recurrent   Past Medical History  Diagnosis Date  . Hypertension   . Dysuria   . UTI (urinary tract infection)   . Tuberculosis     h/o PPD +  . BPH (benign prostatic hyperplasia)   . Anemia     normal Fe, nl B12, nl retic, nl EPO July '13  . Chronic kidney disease     CKD III, obstructive nephropathy  . Elevated PSA, greater than or equal to 20 ng/ml June '13    PSA 107  . Chronic back pain    Past Surgical History  Procedure Laterality Date  . Spleenectomy     Family History  Problem Relation Age of Onset  . Diabetes Mother   . Stroke Brother   . Hearing loss Brother    History  Substance Use Topics  . Smoking status: Former Smoker    Types: Cigarettes  . Smokeless tobacco: Never Used   . Alcohol Use: No    Review of Systems  Constitutional: Negative for fever and chills.  Respiratory: Negative for cough and shortness of breath.   Gastrointestinal: Positive for abdominal pain (suprapubic with abdominal distention). Negative for nausea, vomiting and diarrhea.  Genitourinary: Negative for bladder incontinence (clogged catheter), dysuria, discharge and penile pain.  All other systems reviewed and are negative.      Allergies  Review of patient's allergies indicates no known allergies.  Home Medications   Current Outpatient Rx  Name  Route  Sig  Dispense  Refill  . amLODipine (NORVASC) 5 MG tablet   Oral   Take 5 mg by mouth daily.         Marland Kitchen aspirin 81 MG tablet   Oral   Take 81 mg by mouth daily.         . finasteride (PROSCAR) 5 MG tablet   Oral   Take 5 mg by mouth daily.         Marland Kitchen gabapentin (NEURONTIN) 100 MG capsule   Oral   Take 100 mg by mouth daily as needed.         . labetalol (NORMODYNE) 200 MG tablet   Oral   Take 1 tablet (200 mg total) by mouth every morning.   90 tablet   3   . tamsulosin (FLOMAX) 0.4 MG  CAPS   Oral   Take 2 capsules (0.8 mg total) by mouth daily after supper.   90 capsule   1    BP 153/93  Pulse 62  Temp(Src) 98 F (36.7 C) (Oral)  Resp 16  SpO2 99% Physical Exam  Nursing note and vitals reviewed. Constitutional: He is oriented to person, place, and time. He appears well-developed and well-nourished. No distress.  HENT:  Head: Normocephalic and atraumatic.  Mouth/Throat: No oropharyngeal exudate.  Eyes: EOM are normal. Pupils are equal, round, and reactive to light.  Neck: Normal range of motion. Neck supple.  Cardiovascular: Normal rate and regular rhythm.  Exam reveals no friction rub.   No murmur heard. Pulmonary/Chest: Effort normal and breath sounds normal. No respiratory distress. He has no wheezes. He has no rales.  Abdominal: He exhibits no distension. There is tenderness (suprapubic).  There is no rebound.  Musculoskeletal: Normal range of motion. He exhibits no edema.  Neurological: He is alert and oriented to person, place, and time.  Skin: He is not diaphoretic.    ED Course  Procedures (including critical care time) Labs Review Labs Reviewed - No data to display Imaging Review No results found.   EKG Interpretation None      MDM   Final diagnoses:  Complication of Foley catheter    45M with chronic indwelling foley. Clogged this morning with progressively worsening suprapubic pain. Unable to unclog at home. No fever, N/V/D. Will try to flush foley and if unsuccessful, will replace Foley. Foley placed with great relief, stable for discharge.    Osvaldo Shipper, MD 08/21/13 1556

## 2013-08-21 NOTE — ED Notes (Signed)
Pt reports foley catheter clogged since approx 0800. Tried to flush catheter without success. Had foley changed here 3 weeks ago. C/o mild abd pain.

## 2013-09-04 ENCOUNTER — Encounter: Payer: Self-pay | Admitting: Internal Medicine

## 2013-09-04 ENCOUNTER — Ambulatory Visit (INDEPENDENT_AMBULATORY_CARE_PROVIDER_SITE_OTHER): Payer: 59 | Admitting: Internal Medicine

## 2013-09-04 VITALS — BP 130/80 | HR 93 | Temp 98.7°F | Resp 16

## 2013-09-04 DIAGNOSIS — J019 Acute sinusitis, unspecified: Secondary | ICD-10-CM

## 2013-09-04 DIAGNOSIS — J209 Acute bronchitis, unspecified: Secondary | ICD-10-CM

## 2013-09-04 MED ORDER — HYDROCODONE-HOMATROPINE 5-1.5 MG/5ML PO SYRP: 5.0000 mL | ORAL_SOLUTION | Freq: Four times a day (QID) | ORAL | Status: DC | PRN

## 2013-09-04 MED ORDER — AMOXICILLIN 500 MG PO CAPS: 500.0000 mg | ORAL_CAPSULE | Freq: Three times a day (TID) | ORAL | Status: DC

## 2013-09-04 NOTE — Patient Instructions (Signed)

## 2013-09-04 NOTE — Progress Notes (Signed)
   Subjective:    Patient ID: [name], male    DOB: 06/24/1949, 63 y.o.   MRN: [id number]  HPI Symptoms began 3 days ago as rhinitis, cough, chest congestion with yellow sputum.  Reports chills and significant frontal and maxillary pain; has been taking Tylenol.  Cousin and nephew have been sick with similar symptoms.   Review of Systems Denies otalgia, otorrhea, discolored nasal secretions, sore throat.      Objective:   Physical Exam General appearance:good health ;well nourished; no acute distress or increased work of breathing is present. No lymphadenopathy about the head, neck, or axilla noted.  Eyes: No conjunctival inflammation or lid edema is present. There is no scleral icterus. EOMI and PERRLA. Dense arcus. Vision grossly intact Ears: External ear exam shows no significant lesions or deformities. Otoscopic examination reveals clear canals, tympanic membranes are intact bilaterally without bulging, retraction, inflammation or discharge.  Nose: External nasal examination shows no deformity or inflammation. Nasal mucosa are erythematous and moist without exudates; nasal polyps bilaterally. R septal deviation. Mild obstruction to airflow. Hyponasal speech.  Oral exam: Dental hygiene is good; lips and gums are healthy appearing.There is no oropharyngeal erythema or exudate noted.  Neck: No deformities, thyromegaly, masses, or tenderness noted. Supple with full range of motion without pain.  Heart: Normal rate and regular rhythm. S1 and S2 normal without gallop, murmur, click, rub or other extra sounds.  Lungs:Chest clear to auscultation; expiratory wheezing. No increased work of breathing, deep wet cough.  Extremities: No cyanosis, edema, or clubbing noted  Skin: Warm & dry w/o jaundice or tenting.    Assessment & Plan:  #1 sinusitis  #2 bronchitis  Amox, hydromet or tessalon for cough; nasal cleansing

## 2013-09-04 NOTE — Progress Notes (Addendum)
   Subjective:    Patient ID: [name], male    DOB: 06/24/1949, 63 y.o.   MRN: [id number]  HPI Symptoms began 3 days ago as rhinitis, cough, chest congestion with yellow sputum.  Reports chills and significant frontal and maxillary pain; has been taking Tylenol.  Cousin and nephew have been sick with similar symptoms.    Review of Systems Denies otalgia, otorrhea, discolored nasal secretions, sore throat.     Objective:   Physical Exam General appearance:good health ;well nourished; no acute distress or increased work of breathing is present.  No  lymphadenopathy about the head, neck, or axilla noted.  Eyes: No conjunctival inflammation or lid edema is present. There is no scleral icterus. Ears:  External ear exam shows no significant lesions or deformities.  Otoscopic examination reveals clear canals, tympanic membranes are intact bilaterally without bulging, retraction, inflammation or discharge. Nose:  External nasal examination shows no deformity or inflammation. Nasal mucosa are pink and moist without exudates; nasal polyps bilaterally. R septal deviation. Mild obstruction to airflow. Hyponasal speech.  Oral exam: Dental hygiene is good; lips and gums are healthy appearing.There is no oropharyngeal erythema or exudate noted.  Neck:  No deformities, thyromegaly, masses, or tenderness noted.   Supple with full range of motion without pain.  Heart:  Normal rate and regular rhythm. S1 and S2 normal without gallop, murmur, click, rub or other extra sounds.  Lungs:Chest clear to auscultation; no wheezes, rhonchi,rales ,or rubs present.No increased work of breathing.   Extremities:  No cyanosis, edema, or clubbing  noted  Skin: Warm & dry w/o jaundice or tenting.     Assessment & Plan:  #1 sinusitis  #2 bronchitis  amox, hydromet or tessalon for cough; nasal cleansing

## 2013-09-13 IMAGING — US US RENAL
1 series · 13 of 25 positions shown · non-contrast
Comparison: CT scan 03/03/2007

CLINICAL DATA: Acute renal failure

RENAL/URINARY TRACT ULTRASOUND
TECHNIQUE: Renal ultrasound

[Series 1: us renal · 0.30mm/px · 13 of 42 slices shown]
[im 1/42]
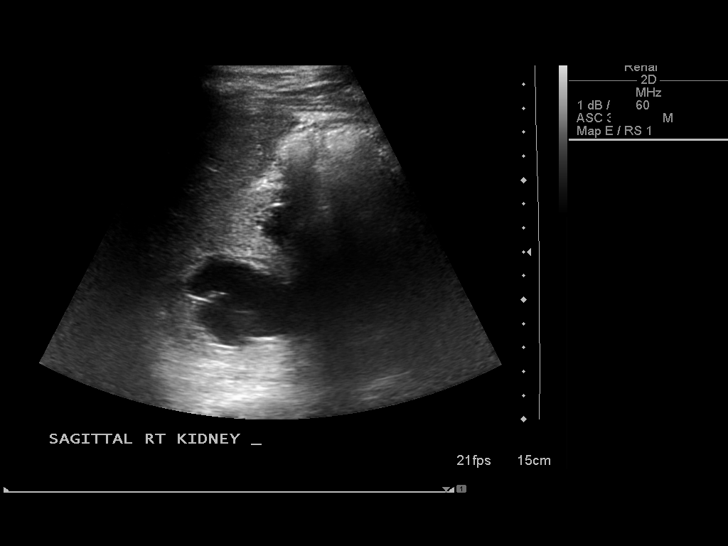
[im 4/42]
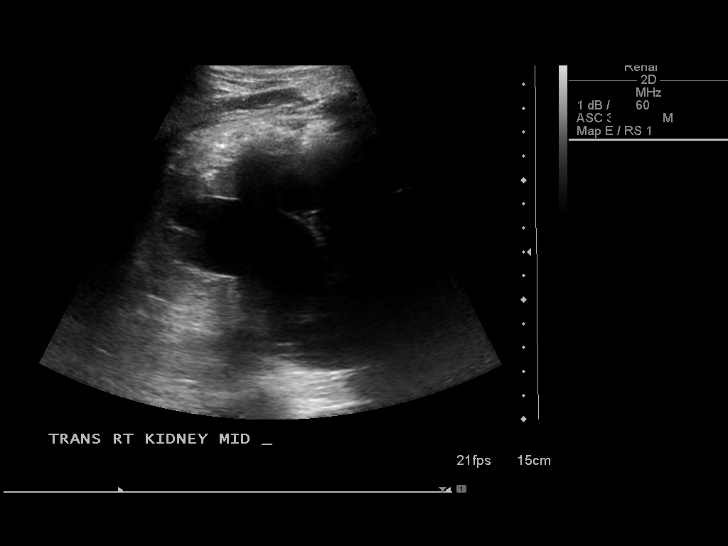
[im 7/42]
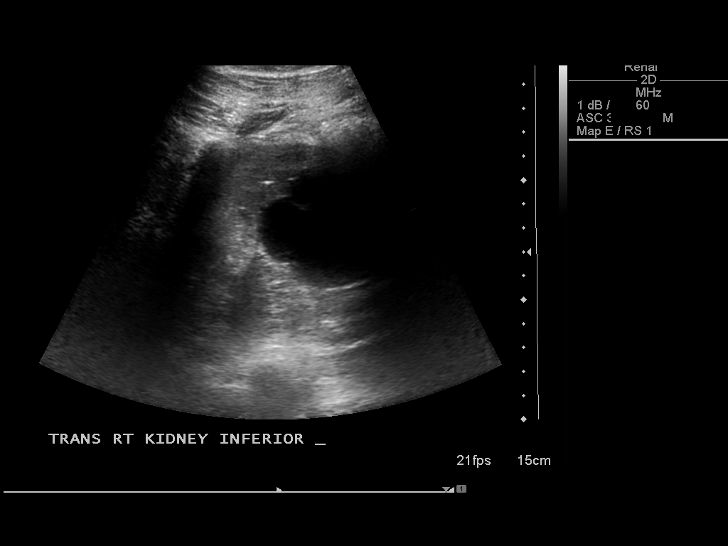
[im 11/42]
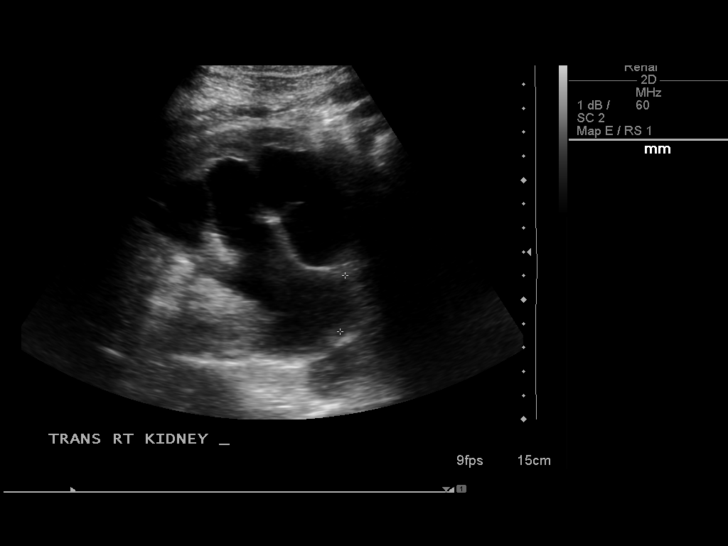
[im 14/42]
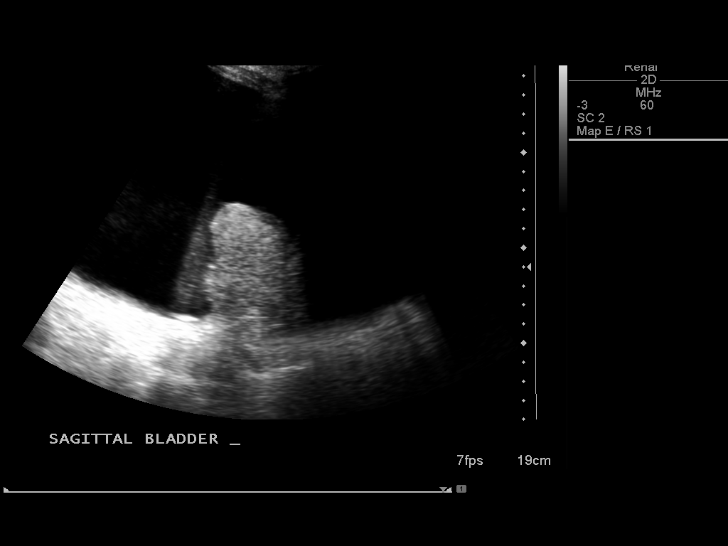
[im 18/42]
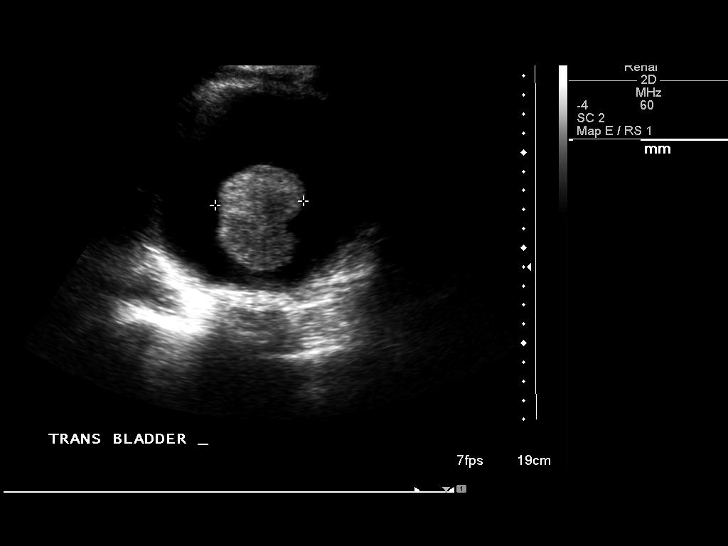
[im 21/42]
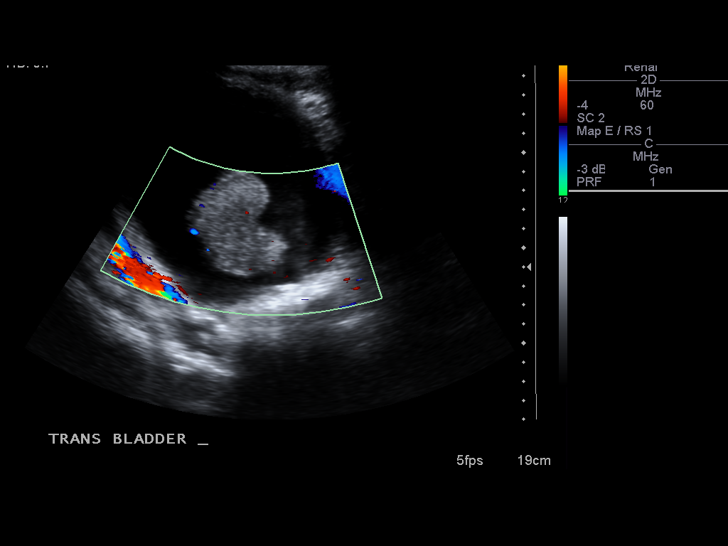
[im 24/42]
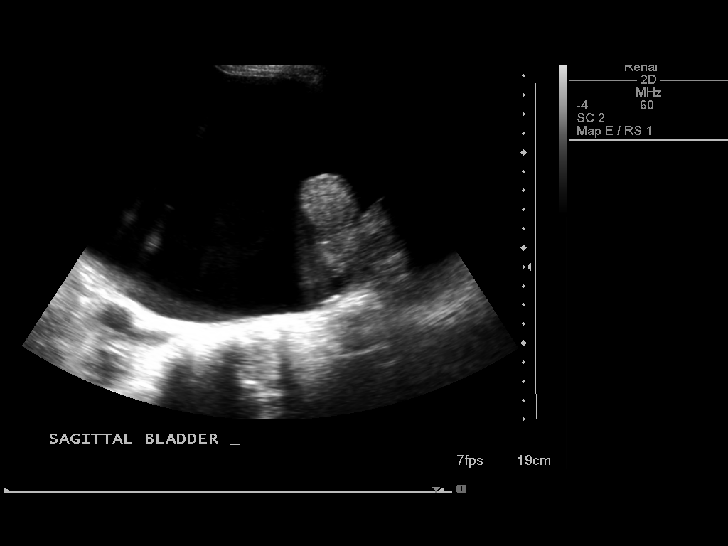
[im 28/42]
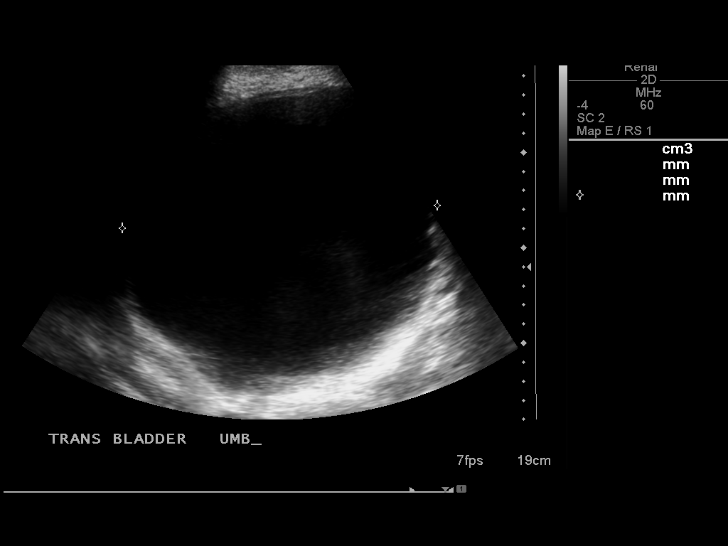
[im 31/42]
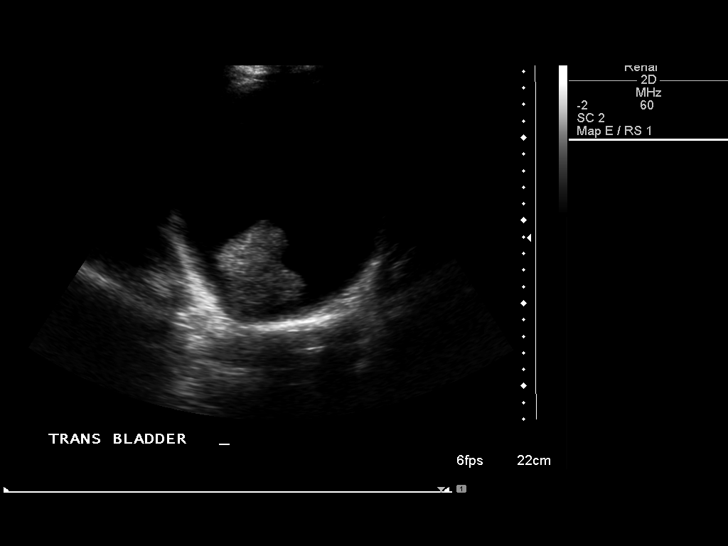
[im 35/42]
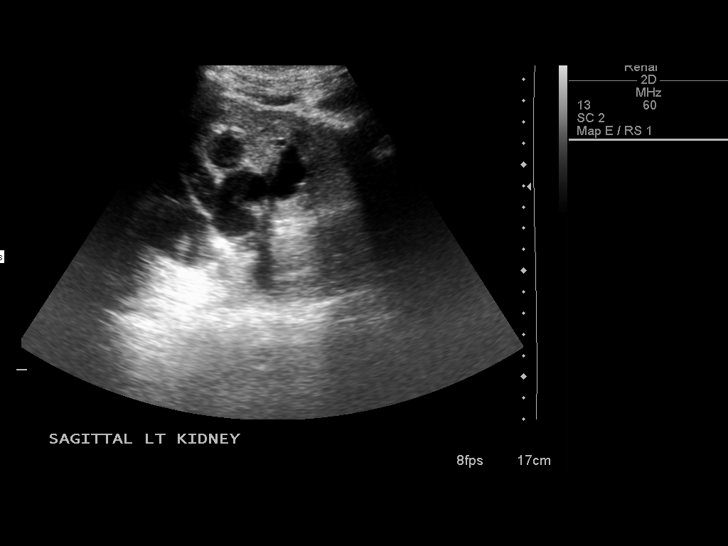
[im 38/42]
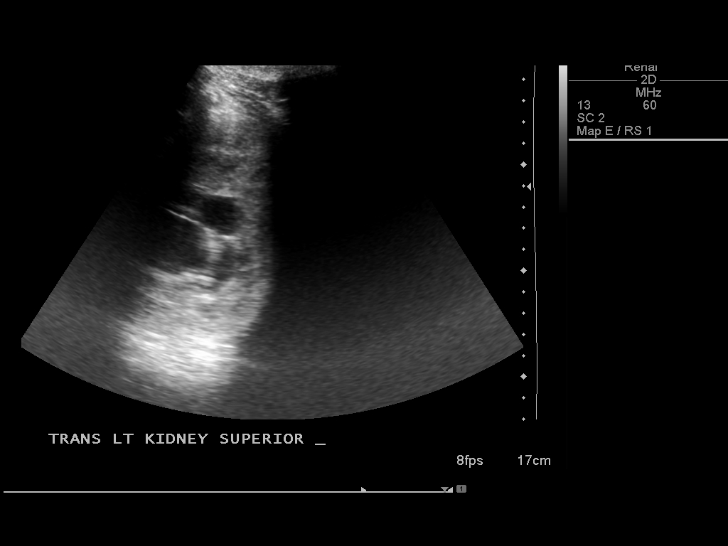
[im 42/42]
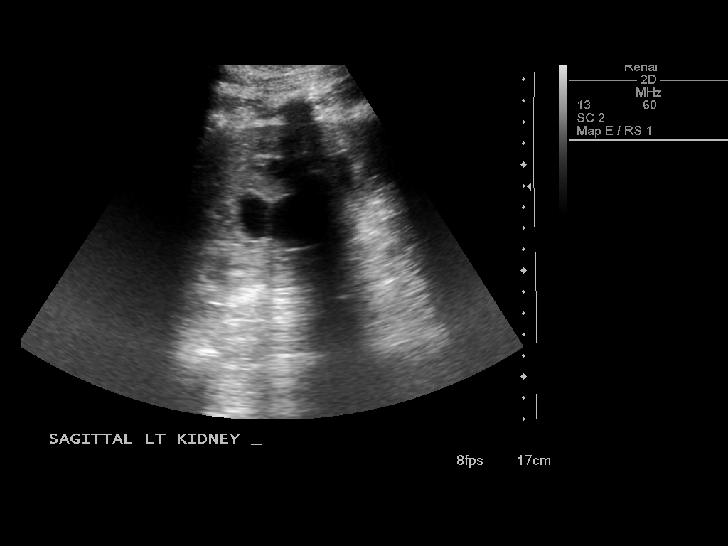

[13 of 25 positions shown; findings below may reference images not displayed]

FINDINGS: Right kidney measures 14 cm in length.  There is moderate
right hydronephrosis.  No diagnostic renal calculus.  A cyst in the
lower pole measures 4.3 by 3.5 cm.

Left kidney measures 13.8 cm.  There is moderate left
hydronephrosis.  Bilateral hydroureter is noted.

A distended urinary bladder is noted.  There is a polypoid filling
defect within the urinary bladder measures at least 6.6 x 5 x
cm.  This may be due to a bladder mass or polypoid enlarged
prostate gland with indentation of urinary bladder base.  Prevoid
bladder measures 6836 ml.  The patient was not able to void.
Correlation with urology exam and cystoscopy is recommended.
IMPRESSION: 1.  Bilateral moderate hydronephrosis and hydroureter.  A cyst is
noted in the lower pole of the right kidney measures 4.3 cm.
2.A distended urinary bladder is noted.  There is a polypoid
filling defect within the urinary bladder measures at least 6.6 x 5
x 4.6 cm.  This may be due to a bladder mass or polypoid enlarged
prostate gland with indentation of urinary bladder base.  Prevoid
bladder measures 6836 ml.  The patient was not able to void.]
Correlation with urology exam and cystoscopy is recommended.

This was made a call report.

## 2013-09-13 IMAGING — CR DG CHEST 2V
2 series · 2 of 2 positions shown · non-contrast
Comparison: 02/21/2006

CLINICAL DATA: Bilateral leg swelling

CHEST - 2 VIEW

[x chest ap]
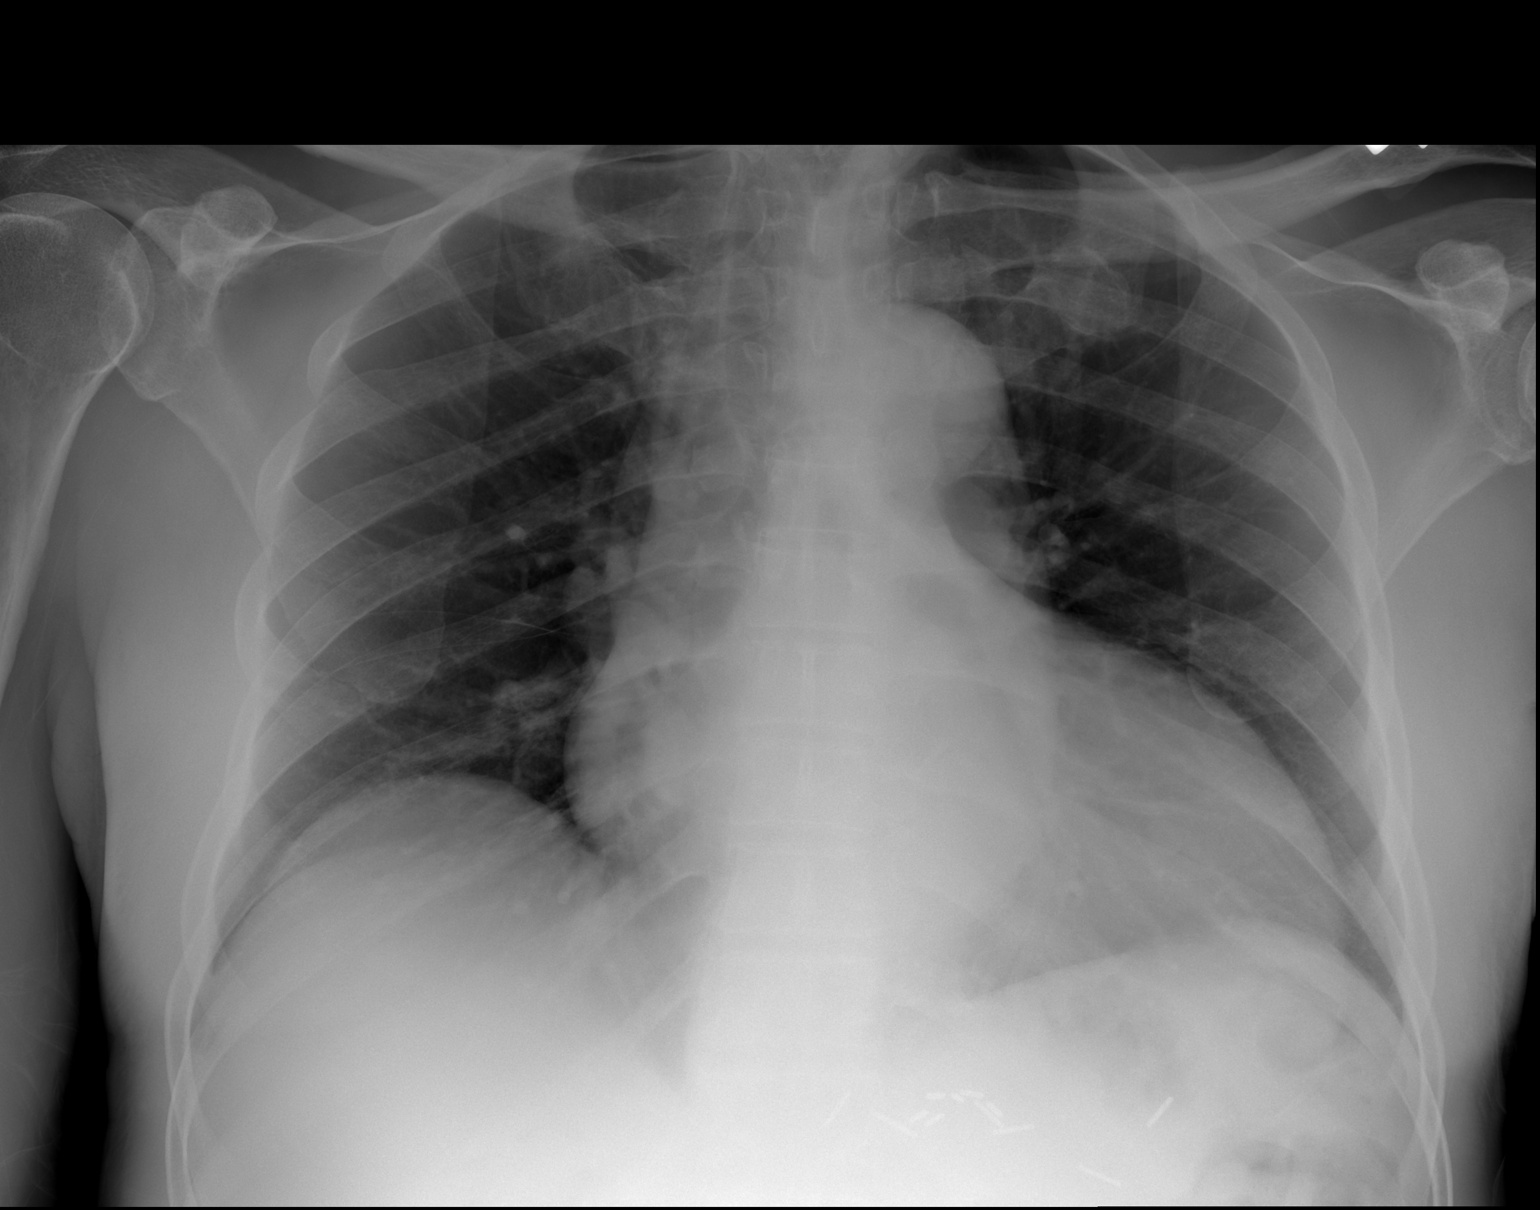

[w chest lat]
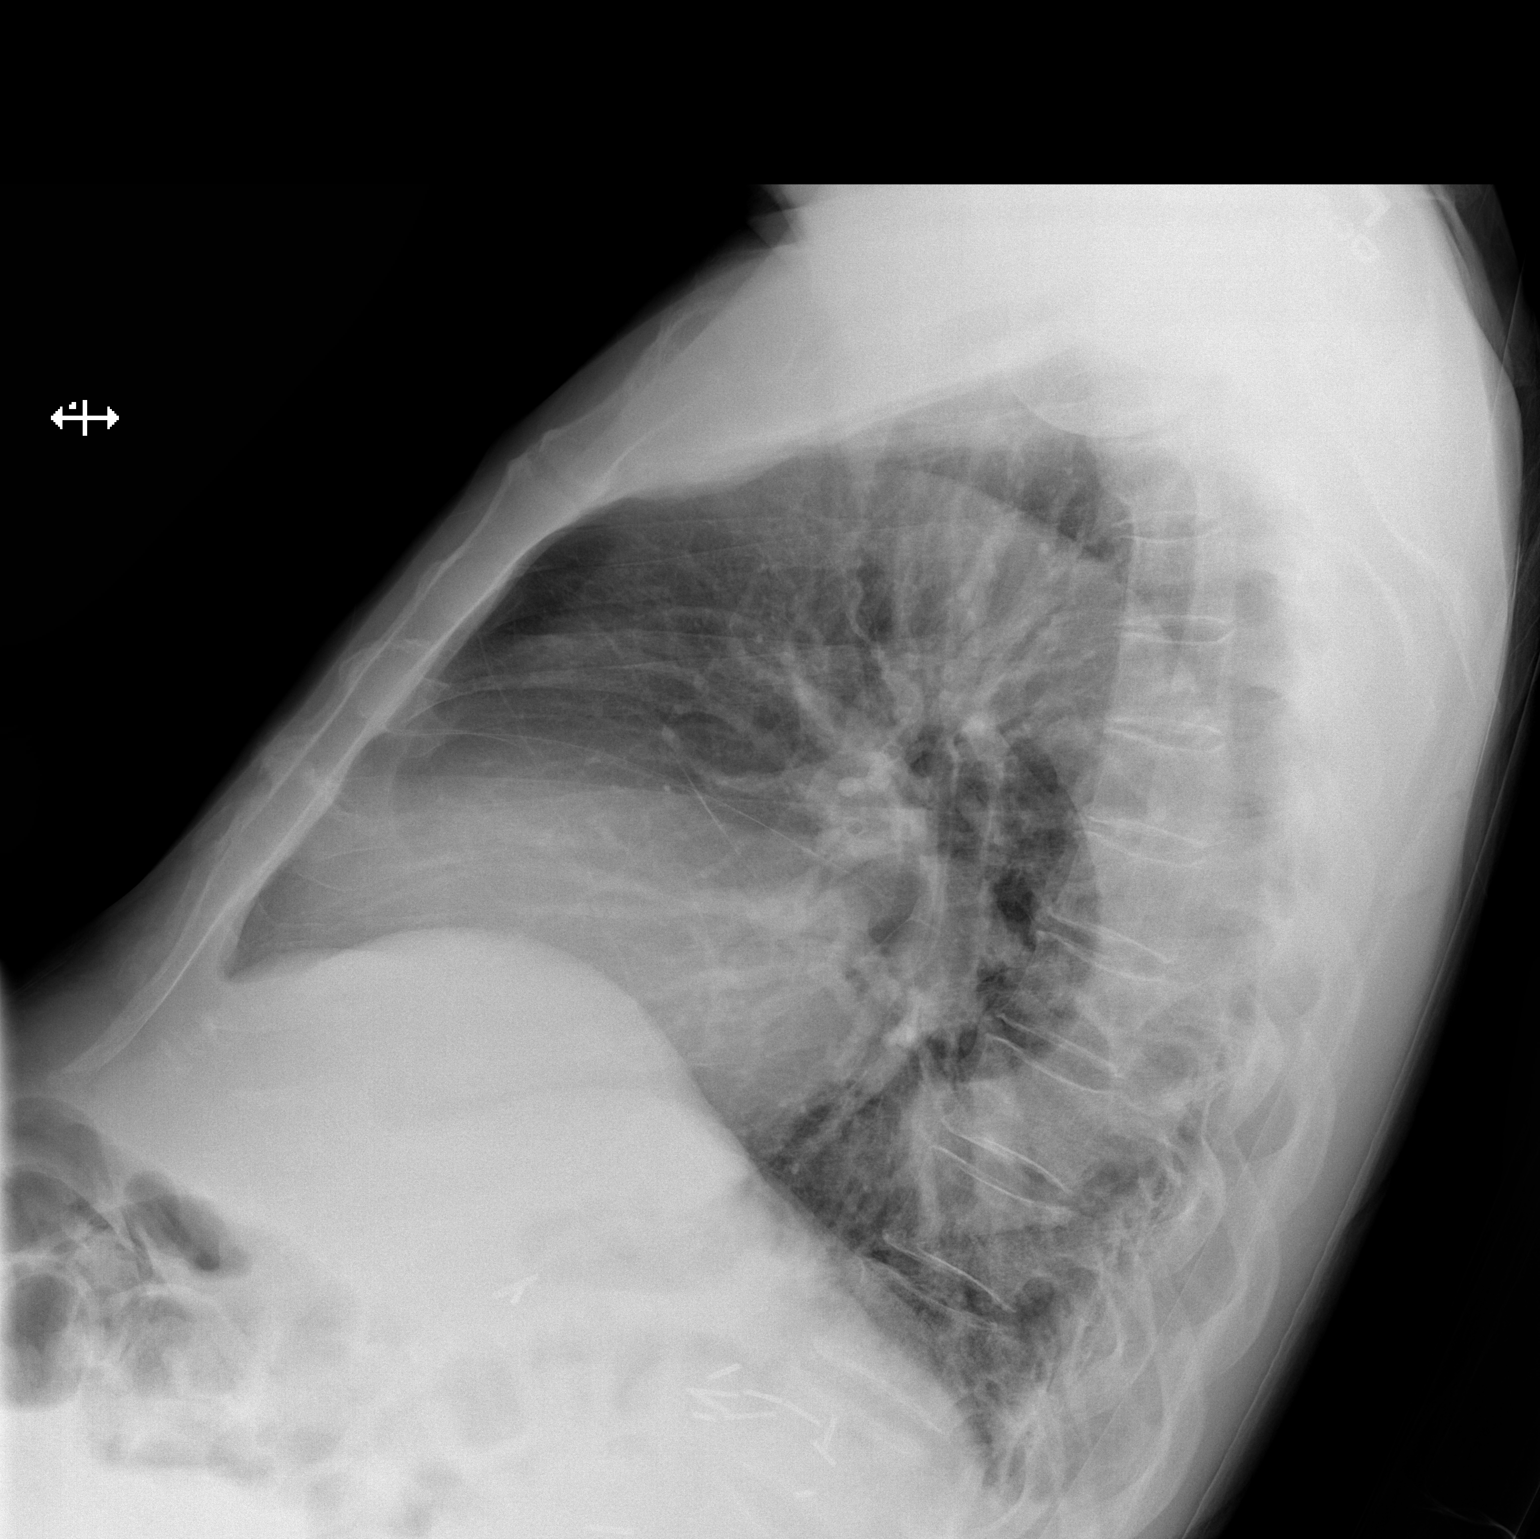

[2 of 2 positions shown; findings below may reference images not displayed]

FINDINGS: Cardiomegaly is noted.  Elevation of the right
hemidiaphragm.  Tortuous thoracic aorta is noted.  No acute
infiltrate or pulmonary edema.  Surgical clips are noted in left
upper abdomen.
IMPRESSION: Cardiomegaly.  No active disease.  Elevation of the right
hemidiaphragm.

## 2013-09-14 DIAGNOSIS — N401 Enlarged prostate with lower urinary tract symptoms: Secondary | ICD-10-CM | POA: Diagnosis not present

## 2013-09-14 DIAGNOSIS — N138 Other obstructive and reflux uropathy: Secondary | ICD-10-CM | POA: Diagnosis not present

## 2013-09-19 DIAGNOSIS — N183 Chronic kidney disease, stage 3 unspecified: Secondary | ICD-10-CM | POA: Diagnosis not present

## 2013-09-25 ENCOUNTER — Telehealth: Payer: Self-pay | Admitting: Internal Medicine

## 2013-09-25 NOTE — Telephone Encounter (Signed)
Patient is having pain under both feet.  He feels it is unbearable to walk and when he sleeps he has to leave foot outside of comforter b/c of the heat.  He is requesting a referral to a foot specialist.

## 2013-09-25 NOTE — Telephone Encounter (Signed)
Patient already had appt.made.  He just needs a referral from [facility].  Referred to [facility].

## 2013-10-12 DIAGNOSIS — R339 Retention of urine, unspecified: Secondary | ICD-10-CM | POA: Diagnosis not present

## 2013-11-01 ENCOUNTER — Ambulatory Visit: Payer: 59 | Admitting: Neurology

## 2013-11-06 ENCOUNTER — Ambulatory Visit (INDEPENDENT_AMBULATORY_CARE_PROVIDER_SITE_OTHER): Payer: Medicare Other | Admitting: Neurology

## 2013-11-06 ENCOUNTER — Encounter: Payer: Self-pay | Admitting: Neurology

## 2013-11-06 VITALS — Ht 73.0 in | Wt 265.0 lb

## 2013-11-06 DIAGNOSIS — N182 Chronic kidney disease, stage 2 (mild): Secondary | ICD-10-CM

## 2013-11-06 DIAGNOSIS — G609 Hereditary and idiopathic neuropathy, unspecified: Secondary | ICD-10-CM

## 2013-11-06 DIAGNOSIS — D649 Anemia, unspecified: Secondary | ICD-10-CM

## 2013-11-06 DIAGNOSIS — I10 Essential (primary) hypertension: Secondary | ICD-10-CM | POA: Diagnosis not present

## 2013-11-06 DIAGNOSIS — R7309 Other abnormal glucose: Secondary | ICD-10-CM

## 2013-11-06 NOTE — Progress Notes (Signed)
PATIENT: [name] DOB: 06/24/1949  HISTORICAL  [name] is a 63 years old right-handed male, referred by his podiatrist Dr. Elby Showers, and the primary care physician Dr. 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  He complains of 2 years history of difficulty initiating urination, has been Self-Cath over the past 2 years, he also complains of worsening low back pain, shooting to bilateral lower extremities, getting worse after prolonged walking,  Complains of bilateral feet paresthesia, starting at the bottom of his feet, now ascending to midshin level,   REVIEW OF SYSTEMS: Full 14 system review of systems performed and notable only for weight gain, fatigue, swelling in legs, blood in stool, constipation, urination problem, feeling hot, joint pain, cramps, numbness, weakness, sleepiness, restless legs, not enough sleep, decreased energy  ALLERGIES: No Known Allergies  HOME MEDICATIONS: Current Outpatient Prescriptions on File Prior to Visit  Medication Sig Dispense Refill  . amLODipine (NORVASC) 5 MG tablet Take 5 mg by mouth daily.      Marland Kitchen amoxicillin (AMOXIL) 500 MG capsule Take 1 capsule (500 mg total) by mouth 3 (three) times daily.  21 capsule  0  . aspirin 81 MG tablet Take 81 mg by mouth daily.      . finasteride (PROSCAR) 5 MG tablet Take 5 mg by mouth daily.      Marland Kitchen gabapentin (NEURONTIN) 100 MG capsule Take 100 mg by mouth daily as needed.      Marland Kitchen HYDROcodone-homatropine (HYDROMET) 5-1.5 MG/5ML syrup Take 5 mLs by mouth every 6 (six) hours as needed for cough.  120 mL  0  . labetalol (NORMODYNE) 200 MG tablet Take 1 tablet (200 mg total) by mouth every morning.  90 tablet  3     PAST MEDICAL  HISTORY: Past Medical History  Diagnosis Date  . Hypertension   . Dysuria   . UTI (urinary tract infection)   . Tuberculosis     h/o PPD +  . BPH (benign prostatic hyperplasia)   . Anemia     normal Fe, nl B12, nl retic, nl EPO Donald '13  . Chronic kidney disease     CKD III, obstructive nephropathy  . Elevated PSA, greater than or equal to 20 ng/ml June '13    PSA 107  . Chronic back pain     PAST SURGICAL HISTORY: Past Surgical History  Procedure Laterality Date  . Spleenectomy      FAMILY HISTORY: Family History  Problem Relation Age of Onset  . Diabetes Mother   . Stroke Brother   . Hearing loss Brother     SOCIAL HISTORY:  History   Social History  . Marital Status: Married    Spouse Name: N/A    Number of Children: 2  . Years of Education: 51   Occupational History    Unemployed, Professor   Social History Main Topics  . Smoking status: Former Smoker    Types: Cigarettes  . Smokeless tobacco: Never Used     Comment: Quit 1981  . Alcohol Use: No     Comment: Quit 2004  . Drug Use: No  . Sexual Activity: No   Other Topics Concern  . Not on file   Social History Narrative  Native of [place], raised poor farming community. . To [place] '78 - finished College, all but Arts administrator. Married - wife in [place], blocked from [place] to [place]. 1 son '90; 1 dtr '93. 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 Resources depleted.   Right handed.   Caffeine Hot daily one daily.   PHYSICAL EXAM   Filed Vitals:   11/06/13 1122  Height: 6\' 1"  (1.854 m)  Weight: 265 lb (120.203 kg)    Not recorded    Body mass index is 34.97 kg/(m^2).   Generalized: In no acute distress  Neck: Supple, no carotid bruits   Cardiac: Regular rate rhythm  Pulmonary: Clear to auscultation bilaterally  Musculoskeletal: No deformity  Neurological examination  Mentation: Alert oriented to time, place, history taking, and causual  conversation  Cranial nerve II-XII: Pupils were equal round reactive to light. Extraocular movements were full.  Visual field were full on confrontational test. Bilateral fundi were sharp.  Facial sensation and strength were normal. Hearing was intact to finger rubbing bilaterally. Uvula tongue midline.  Head turning and shoulder shrug and were normal and symmetric.Tongue protrusion into cheek strength was normal.  Motor: Normal tone, bulk and strength.  Sensory: Length dependent decreased fine touch, pinprick to mid shin level, preserved toe vibratory sensation, and proprioception at toes.  Coordination: Normal finger to nose, heel-to-shin bilaterally there was no truncal ataxia  Gait: Rising up from seated position without assistance, central obesity, normal stance, without trunk ataxia, moderate stride, good arm swing, smooth turning, he has mild difficulty perform tiptoe, and heel walking.   Romberg signs: Negative  Deep tendon reflexes: Brachioradialis3/3, biceps 3/3, triceps 2/2, patellar 3/3, Achilles trace, plantar responses were flexor bilaterally.   DIAGNOSTIC DATA (LABS, IMAGING, TESTING) - I reviewed patient records, labs, notes, testing and imaging myself where available.  Lab Results  Component Value Date   WBC 6.7 08/02/2013   HGB 14.1 08/02/2013   HCT 42.1 08/02/2013   MCV 86.1 08/02/2013   PLT 274 08/02/2013      Component Value Date/Time   NA 142 08/02/2013 1520   K 4.0 08/02/2013 1520   CL 100 08/02/2013 1520   CO2 30 08/02/2013 1520   GLUCOSE 116* 08/02/2013 1520   BUN 27* 08/02/2013 1520   CREATININE 2.40* 08/02/2013 1520   CALCIUM 9.5 08/02/2013 1520   PROT 7.6 08/02/2013 1520   ALBUMIN 3.4* 08/02/2013 1520   AST 17 08/02/2013 1520   ALT 10 08/02/2013 1520   ALKPHOS 90 08/02/2013 1520   BILITOT 0.5 08/02/2013 1520   GFRNONAA 27* 08/02/2013 1520   GFRAA 31* 08/02/2013 1520   Lab Results  Component Value Date   CHOL 188 11/22/2011   HDL 73 11/22/2011   LDLCALC 103*  11/22/2011   TRIG 59 11/22/2011   CHOLHDL 2.6 11/22/2011   Lab Results  Component Value Date   HGBA1C 5.5 12/04/2011   Lab Results  Component Value Date   VITAMINB12 311 12/05/2011   Lab Results  Component Value Date   TSH 2.564 12/04/2011    ASSESSMENT AND PLAN  [name] is a 63 y.o. male complains of chronic low back pain, unsteady gait,, radiating pain to bilateral lower extremity, bilateral feet paresthesia, hyperreflexia on examination,   Differentiation diagnosis including cervical spondylitic myelopathy, lumbar radiculopathy, also need to rule out peripheral neuropathy MRI of cervical, lumbar spine Laboratory evaluations   Marcial Pacas, M.D. Ph.D.  [facility] [address], [place],  [place] [address] [phone]

## 2013-11-08 LAB — IRON AND TIBC
Iron Saturation: 21 % (ref 15–55)
Iron: 50 ug/dL (ref 40–155)
TIBC: 240 ug/dL — ABNORMAL LOW (ref 250–450)
UIBC: 190 ug/dL (ref 150–375)

## 2013-11-08 LAB — PROTEIN ELECTROPHORESIS
A/G Ratio: 1.1 (ref 0.7–2.0)
Albumin ELP: 3.9 g/dL (ref 3.2–5.6)
Alpha 1: 0.2 g/dL (ref 0.1–0.4)
Alpha 2: 0.5 g/dL (ref 0.4–1.2)
Beta: 1.1 g/dL (ref 0.6–1.3)
Gamma Globulin: 1.7 g/dL — ABNORMAL HIGH (ref 0.5–1.6)
Globulin, Total: 3.5 g/dL (ref 2.0–4.5)
Total Protein: 7.4 g/dL (ref 6.0–8.5)

## 2013-11-08 LAB — C-REACTIVE PROTEIN: CRP: 5.6 mg/L — ABNORMAL HIGH (ref 0.0–4.9)

## 2013-11-08 LAB — HIV ANTIBODY (ROUTINE TESTING W REFLEX)
HIV 1/O/2 Abs-Index Value: <1 (ref <1.00)
HIV-1/HIV-2 Ab: NONREACTIVE

## 2013-11-08 LAB — VITAMIN B12: Vitamin B-12: 626 pg/mL (ref 211–946)

## 2013-11-08 LAB — FERRITIN: Ferritin: 391 ng/mL (ref 30–400)

## 2013-11-08 LAB — THYROID PANEL WITH TSH
FREE THYROXINE INDEX: 1.9 (ref 1.2–4.9)
T3 Uptake Ratio: 26 % (ref 24–39)
T4, Total: 7.4 ug/dL (ref 4.5–12.0)
TSH: 1.66 u[IU]/mL (ref 0.450–4.500)

## 2013-11-08 LAB — RPR: RPR: NONREACTIVE

## 2013-11-08 LAB — COPPER, SERUM: Copper: 123 ug/dL (ref 72–166)

## 2013-11-08 LAB — CK: Total CK: 131 U/L (ref 24–204)

## 2013-11-08 LAB — FOLATE: Folate: 11.2 ng/mL (ref >3.0)

## 2013-11-08 LAB — HGB A1C W/O EAG: Hgb A1c MFr Bld: 5.8 % — ABNORMAL HIGH (ref 4.8–5.6)

## 2013-11-08 LAB — ANA W/REFLEX IF POSITIVE: Anti Nuclear Antibody(ANA): NEGATIVE

## 2013-11-08 LAB — SEDIMENTATION RATE: Sed Rate: 6 mm/hr (ref 0–30)

## 2013-11-09 DIAGNOSIS — R339 Retention of urine, unspecified: Secondary | ICD-10-CM | POA: Diagnosis not present

## 2013-11-19 ENCOUNTER — Ambulatory Visit
Admission: RE | Admit: 2013-11-19 | Discharge: 2013-11-19 | Disposition: A | Discharge status: Home / Self Care | Payer: Medicare Other | Source: Ambulatory Visit | Attending: Neurology | Admitting: Neurology

## 2013-11-19 DIAGNOSIS — I10 Essential (primary) hypertension: Secondary | ICD-10-CM

## 2013-11-19 DIAGNOSIS — D649 Anemia, unspecified: Secondary | ICD-10-CM

## 2013-11-19 DIAGNOSIS — R209 Unspecified disturbances of skin sensation: Secondary | ICD-10-CM

## 2013-11-19 DIAGNOSIS — N182 Chronic kidney disease, stage 2 (mild): Secondary | ICD-10-CM

## 2013-11-19 DIAGNOSIS — G609 Hereditary and idiopathic neuropathy, unspecified: Secondary | ICD-10-CM

## 2013-11-19 DIAGNOSIS — M4802 Spinal stenosis, cervical region: Secondary | ICD-10-CM | POA: Diagnosis not present

## 2013-11-19 DIAGNOSIS — M48061 Spinal stenosis, lumbar region without neurogenic claudication: Secondary | ICD-10-CM | POA: Diagnosis not present

## 2013-11-19 DIAGNOSIS — M47817 Spondylosis without myelopathy or radiculopathy, lumbosacral region: Secondary | ICD-10-CM | POA: Diagnosis not present

## 2013-11-19 DIAGNOSIS — M5020 Other cervical disc displacement, unspecified cervical region: Secondary | ICD-10-CM | POA: Diagnosis not present

## 2013-11-19 DIAGNOSIS — M47812 Spondylosis without myelopathy or radiculopathy, cervical region: Secondary | ICD-10-CM | POA: Diagnosis not present

## 2013-11-19 DIAGNOSIS — M5126 Other intervertebral disc displacement, lumbar region: Secondary | ICD-10-CM | POA: Diagnosis not present

## 2013-11-20 ENCOUNTER — Ambulatory Visit (INDEPENDENT_AMBULATORY_CARE_PROVIDER_SITE_OTHER): Payer: Medicare Other | Admitting: Neurology

## 2013-11-20 ENCOUNTER — Encounter (INDEPENDENT_AMBULATORY_CARE_PROVIDER_SITE_OTHER): Payer: Self-pay

## 2013-11-20 DIAGNOSIS — N182 Chronic kidney disease, stage 2 (mild): Secondary | ICD-10-CM

## 2013-11-20 DIAGNOSIS — M48061 Spinal stenosis, lumbar region without neurogenic claudication: Secondary | ICD-10-CM | POA: Diagnosis not present

## 2013-11-20 DIAGNOSIS — D649 Anemia, unspecified: Secondary | ICD-10-CM

## 2013-11-20 DIAGNOSIS — Z0289 Encounter for other administrative examinations: Secondary | ICD-10-CM

## 2013-11-20 DIAGNOSIS — I10 Essential (primary) hypertension: Secondary | ICD-10-CM

## 2013-11-20 DIAGNOSIS — G609 Hereditary and idiopathic neuropathy, unspecified: Secondary | ICD-10-CM

## 2013-11-20 NOTE — Progress Notes (Signed)
PATIENT: [name] DOB: 06/24/1949  HISTORICAL  [name] is a 63 years old right-handed male, referred by his podiatrist Dr. Elby Showers, and the primary care physician Dr. 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  He complains of 2 years history of difficulty initiating urination, was diagnosed with prostate hypertrophy, had Forley catheter over the past 2 years, he also complains of worsening low back pain, shooting to bilateral lower extremities, getting worse after prolonged walking,  Complains of bilateral feet paresthesia, starting at the bottom of his feet, now ascending to midshin level,  UPDATE June 16th 2015: He returns for electrodiagnostic study today, which has showed chronic neuropathic changes at bilateral lumbosacral myotomes, there was no evidence of peripheral neuropathy  We have reviewed the MRI together, there was evidence of 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, moderate at L 3-4, moderate to severe foraminal stenosis at bilateral L 3 4, L4-5, L5-S1, mild degenerative disc disease at MRI cervical, there was no significant canal stenosis,  Laboratory evaluation showed normal or negative Z36, RPR, HIV, folic acid, ESR, C-reactive protein, thyroid function test  REVIEW OF SYSTEMS: Full 14 system review of systems performed and notable only for low back pain, gait difficulty, difficulty initiating urine, lower back muscle spasm  ALLERGIES: No Known Allergies  HOME MEDICATIONS: Current Outpatient Prescriptions on File Prior to Visit  Medication Sig Dispense Refill  . amLODipine (NORVASC) 5 MG tablet Take 5 mg by mouth daily.      Marland Kitchen amoxicillin (AMOXIL) 500 MG capsule Take 1 capsule (500 mg total) by  mouth 3 (three) times daily.  21 capsule  0  . aspirin 81 MG tablet Take 81 mg by mouth daily.      . finasteride (PROSCAR) 5 MG tablet Take 5 mg by mouth daily.      Marland Kitchen gabapentin (NEURONTIN) 100 MG capsule Take 100 mg by mouth daily as needed.      Marland Kitchen HYDROcodone-homatropine (HYDROMET) 5-1.5 MG/5ML syrup Take 5 mLs by mouth every 6 (six) hours as needed for cough.  120 mL  0  . labetalol (NORMODYNE) 200 MG tablet Take 1 tablet (200 mg total) by mouth every morning.  90 tablet  3     PAST MEDICAL HISTORY: Past Medical History  Diagnosis Date  . Hypertension   . Dysuria   . UTI (urinary tract infection)   . Tuberculosis     h/o PPD +  . BPH (benign prostatic hyperplasia)   . Anemia     normal Fe, nl B12, nl retic, nl EPO July '13  . Chronic kidney disease     CKD III, obstructive nephropathy  . Elevated PSA, greater than or equal to 20 ng/ml June '13    PSA 107  . Chronic back pain     PAST SURGICAL HISTORY: Past Surgical History  Procedure Laterality Date  . Spleenectomy      FAMILY HISTORY: Family History  Problem Relation Age of Onset  . Diabetes Mother   . Stroke Brother   . Hearing loss Brother     SOCIAL HISTORY:  History   Social History  . Marital Status: Married    Spouse Name: N/A    Number of Children: 2  .  Years of Education: 72   Occupational History    Unemployed, Professor   Social History Main Topics  . Smoking status: Former Smoker    Types: Cigarettes  . Smokeless tobacco: Never Used     Comment: Quit 1981  . Alcohol Use: No     Comment: Quit 2004  . Drug Use: No  . Sexual Activity: No   Other Topics Concern  . Not on file   Social History Narrative   Native of [place], raised poor farming community. . To [place] '78 - finished College, all but Arts administrator. Married - wife in [place], blocked from [place] to [place]. 1 son '90; 1 dtr '93. 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 Resources depleted.   Right  handed.   Caffeine Hot daily one daily.   PHYSICAL EXAM   There were no vitals filed for this visit.  Not recorded    There is no weight on file to calculate BMI.   Generalized: In no acute distress  Neck: Supple, no carotid bruits   Cardiac: Regular rate rhythm  Pulmonary: Clear to auscultation bilaterally  Musculoskeletal: No deformity  Neurological examination  Mentation: Alert oriented to time, place, history taking, and causual conversation  Cranial nerve II-XII: Pupils were equal round reactive to light. Extraocular movements were full.  Visual field were full on confrontational test. Bilateral fundi were sharp.  Facial sensation and strength were normal. Hearing was intact to finger rubbing bilaterally. Uvula tongue midline.  Head turning and shoulder shrug and were normal and symmetric.Tongue protrusion into cheek strength was normal.  Motor: Normal tone, bulk and strength, with exception of mild bilateral toe extension, and flexion weakness  Sensory: Length dependent decreased fine touch, pinprick to mid shin level, preserved toe vibratory sensation, and proprioception at toes.  Coordination: Normal finger to nose, heel-to-shin bilaterally there was no truncal ataxia  Gait: Rising up from seated position pushing on chair arm, cautious, difficulty standing up on tiptoe, heels, Romberg signs: Negative  Deep tendon reflexes: Brachioradialis3/3, biceps 3/3, triceps 2/2, patellar 3/3, Achilles trace, plantar responses were flexor bilaterally.   DIAGNOSTIC DATA (LABS, IMAGING, TESTING) - I reviewed patient records, labs, notes, testing and imaging myself where available.  Lab Results  Component Value Date   WBC 6.7 08/02/2013   HGB 14.1 08/02/2013   HCT 42.1 08/02/2013   MCV 86.1 08/02/2013   PLT 274 08/02/2013      Component Value Date/Time   NA 142 08/02/2013 1520   K 4.0 08/02/2013 1520   CL 100 08/02/2013 1520   CO2 30 08/02/2013 1520   GLUCOSE 116* 08/02/2013  1520   BUN 27* 08/02/2013 1520   CREATININE 2.40* 08/02/2013 1520   CALCIUM 9.5 08/02/2013 1520   PROT 7.4 11/06/2013 1221   PROT 7.6 08/02/2013 1520   ALBUMIN 3.4* 08/02/2013 1520   AST 17 08/02/2013 1520   ALT 10 08/02/2013 1520   ALKPHOS 90 08/02/2013 1520   BILITOT 0.5 08/02/2013 1520   GFRNONAA 27* 08/02/2013 1520   GFRAA 31* 08/02/2013 1520   Lab Results  Component Value Date   CHOL 188 11/22/2011   HDL 73 11/22/2011   LDLCALC 103* 11/22/2011   TRIG 59 11/22/2011   CHOLHDL 2.6 11/22/2011   Lab Results  Component Value Date   HGBA1C 5.8* 11/06/2013   Lab Results  Component Value Date   LTJQZESP23 300 11/06/2013   Lab Results  Component Value Date   TSH 1.660 11/06/2013  ASSESSMENT AND PLAN  [name] is a 63 y.o. male complains of difficulty initiating urine, Forley catheter dependent, chronic low back pain, unsteady gait,, radiating pain to bilateral lower extremity, bilateral feet paresthesia, electrodiagnostic study has demonstrated chronic neuropathic changes involving bilateral lumbosacral myotomes, consistent with chronic bilateral lumbosacral radiculopathy, MRI also demonstrated severe canal stenosis at L4-5, moderate stenosis at L3-4, moderate to severe bilateral foraminal stenosis at L3-4, L4-5, L5-S1,  I will refer him to neurosurgeon for decompression Return to clinic in 6 months     Marcial Pacas, M.D. Ph.D.  [facility] [address], [place], [place] [address] [phone]

## 2013-11-20 NOTE — Patient Instructions (Signed)
Request an appointment Online request form [phone] [place] area [email or web address]   [facility]

## 2013-11-20 NOTE — Procedures (Signed)
   NCS (NERVE CONDUCTION STUDY) WITH EMG (ELECTROMYOGRAPHY) REPORT   STUDY DATE: June 16th 2015 PATIENT NAME: [name] DOB: 06/24/1949 MRN: [id number]    TECHNOLOGIST: Laretta Alstrom ELECTROMYOGRAPHER: Marcial Pacas M.D.  CLINICAL INFORMATION:   63 years old [place] American male, with history of chronic low back pain, radiating pain to his bilateral lower extremity, getting worse over the past 2 years, also difficulty initiating urination  On exam: Mild bilateral total flexion, extension weakness, length dependent sensory changes, trace ankle reflexes  FINDINGS: NERVE CONDUCTION STUDY: Bilateral peroneal sensory responses were normal. Bilateral peroneal EDB, tibial motor responses were normal. Bilateral tibial H. reflexes were normal and symmetric  NEEDLE ELECTROMYOGRAPHY: Selected needle examination was performed at bilateral lower extremity muscles, bilateral lumbosacral paraspinal muscles.  Bilateral tibialis anterior, tibialis posterior, vastus lateralis, normally insertion activity, no spontaneous activity, slightly enlarged motor unit potential, with mildly decreased recruitment patterns  There was active denervation set bilateral L4-5 lumbar paraspinal muscles, 1-2 plus, there was no spontaneous activity at the levels above, and below, L 3 4, 5 S1,  IMPRESSION:   This is an abnormal study. There is electrodiagnostic evidence of chronic neuropathic changes involving bilateral lumbosacral myotomes, L4 L5-S1, consistent with chronic bilateral lumbar radiculopathies,   INTERPRETING PHYSICIAN:   Marcial Pacas M.D. Ph.D. [facility] [address] [place], [place] [address] [phone]

## 2013-12-04 ENCOUNTER — Ambulatory Visit (INDEPENDENT_AMBULATORY_CARE_PROVIDER_SITE_OTHER): Payer: Medicare Other | Admitting: Internal Medicine

## 2013-12-04 ENCOUNTER — Other Ambulatory Visit (INDEPENDENT_AMBULATORY_CARE_PROVIDER_SITE_OTHER): Payer: Medicare Other

## 2013-12-04 ENCOUNTER — Encounter: Payer: Self-pay | Admitting: Internal Medicine

## 2013-12-04 VITALS — BP 120/88 | HR 89 | Temp 98.4°F | Ht 74.0 in | Wt 264.0 lb

## 2013-12-04 DIAGNOSIS — R7309 Other abnormal glucose: Secondary | ICD-10-CM | POA: Diagnosis not present

## 2013-12-04 DIAGNOSIS — I10 Essential (primary) hypertension: Secondary | ICD-10-CM

## 2013-12-04 DIAGNOSIS — Z23 Encounter for immunization: Secondary | ICD-10-CM

## 2013-12-04 DIAGNOSIS — N182 Chronic kidney disease, stage 2 (mild): Secondary | ICD-10-CM | POA: Diagnosis not present

## 2013-12-04 DIAGNOSIS — Z0000 Encounter for general adult medical examination without abnormal findings: Secondary | ICD-10-CM | POA: Diagnosis not present

## 2013-12-04 LAB — URINALYSIS, ROUTINE W REFLEX MICROSCOPIC
BILIRUBIN URINE: NEGATIVE
KETONES UR: NEGATIVE
Nitrite: NEGATIVE
Specific Gravity, Urine: 1.01 (ref 1.000–1.030)
Total Protein, Urine: 30 — AB
UROBILINOGEN UA: 0.2 (ref 0.0–1.0)
Urine Glucose: NEGATIVE
pH: 8 (ref 5.0–8.0)

## 2013-12-04 LAB — HEPATIC FUNCTION PANEL
ALT: 13 U/L (ref 0–53)
AST: 18 U/L (ref 0–37)
Albumin: 3.8 g/dL (ref 3.5–5.2)
Alkaline Phosphatase: 82 U/L (ref 39–117)
BILIRUBIN TOTAL: 0.5 mg/dL (ref 0.2–1.2)
Bilirubin, Direct: 0.1 mg/dL (ref 0.0–0.3)
Total Protein: 7.8 g/dL (ref 6.0–8.3)

## 2013-12-04 LAB — CBC WITH DIFFERENTIAL/PLATELET
BASOS ABS: 0.1 10*3/uL (ref 0.0–0.1)
Basophils Relative: 2.1 % (ref 0.0–3.0)
Eosinophils Absolute: 0.2 10*3/uL (ref 0.0–0.7)
Eosinophils Relative: 2.7 % (ref 0.0–5.0)
HEMATOCRIT: 41.7 % (ref 39.0–52.0)
Hemoglobin: 13.7 g/dL (ref 13.0–17.0)
LYMPHS ABS: 3.3 10*3/uL (ref 0.7–4.0)
Lymphocytes Relative: 48.4 % — ABNORMAL HIGH (ref 12.0–46.0)
MCHC: 32.8 g/dL (ref 30.0–36.0)
MCV: 86.9 fl (ref 78.0–100.0)
MONO ABS: 0.5 10*3/uL (ref 0.1–1.0)
Monocytes Relative: 7.2 % (ref 3.0–12.0)
Neutro Abs: 2.7 10*3/uL (ref 1.4–7.7)
Neutrophils Relative %: 39.6 % — ABNORMAL LOW (ref 43.0–77.0)
PLATELETS: 274 10*3/uL (ref 150.0–400.0)
RBC: 4.8 Mil/uL (ref 4.22–5.81)
RDW: 15.2 % (ref 11.5–15.5)
WBC: 6.7 10*3/uL (ref 4.0–10.5)

## 2013-12-04 LAB — LIPID PANEL
Cholesterol: 210 mg/dL — ABNORMAL HIGH (ref 0–200)
HDL: 48.9 mg/dL (ref >39.00)
LDL Cholesterol: 135 mg/dL — ABNORMAL HIGH (ref 0–99)
NonHDL: 161.1
TRIGLYCERIDES: 129 mg/dL (ref 0.0–149.0)
Total CHOL/HDL Ratio: 4
VLDL: 25.8 mg/dL (ref 0.0–40.0)

## 2013-12-04 LAB — TSH: TSH: 1.36 u[IU]/mL (ref 0.35–4.50)

## 2013-12-04 LAB — BASIC METABOLIC PANEL
BUN: 32 mg/dL — ABNORMAL HIGH (ref 6–23)
CO2: 29 mEq/L (ref 19–32)
Calcium: 9.2 mg/dL (ref 8.4–10.5)
Chloride: 99 mEq/L (ref 96–112)
Creatinine, Ser: 2.6 mg/dL — ABNORMAL HIGH (ref 0.4–1.5)
GFR: 32.07 mL/min — AB (ref >60.00)
GLUCOSE: 140 mg/dL — AB (ref 70–99)
POTASSIUM: 3.3 meq/L — AB (ref 3.5–5.1)
Sodium: 137 mEq/L (ref 135–145)

## 2013-12-04 LAB — HEMOGLOBIN A1C: Hgb A1c MFr Bld: 5.7 % (ref 4.6–6.5)

## 2013-12-04 MED ORDER — GABAPENTIN 100 MG PO CAPS: 100.0000 mg | ORAL_CAPSULE | Freq: Three times a day (TID) | ORAL | Status: DC

## 2013-12-04 NOTE — Patient Instructions (Addendum)
You had the Tdap tetanus shot today  You will be contacted regarding the referral for: colonoscopy  Your gabapentin was increased to three times per day  Please continue all other medications as before, and refills have been done if requested.  Please have the pharmacy call with any other refills you may need.  Please continue your efforts at being more active, low cholesterol diet, and weight control.  You are otherwise up to date with prevention measures today.  Please keep your appointments with your specialists as you may have planned  Please go to the LAB in the Basement (turn left off the elevator) for the tests to be done today  You will be contacted by phone if any changes need to be made immediately.  Otherwise, you will receive a letter about your results with an explanation, but please check with MyChart first.  Please remember to sign up for MyChart if you have not done so, as this will be important to you in the future with finding out test results, communicating by private email, and scheduling acute appointments online when needed.  Please return in 1 year for your yearly visit, or sooner if needed

## 2013-12-04 NOTE — Assessment & Plan Note (Signed)
Asympt, for a1c today, cont diet and wt control

## 2013-12-04 NOTE — Progress Notes (Signed)
Subjective:    Patient ID: [name], male    DOB: 06/24/1949, 63 y.o.   MRN: [id number]  HPI  Here for yearly f/u;  Overall doing ok;  Pt denies CP, worsening SOB, DOE, wheezing, orthopnea, PND, worsening LE edema, palpitations, dizziness or syncope.  Pt denies neurological change such as new headache, facial or extremity weakness.  Pt denies polydipsia, polyuria, or low sugar symptoms. Pt states overall good compliance with treatment and medications, good tolerability, and has been trying to follow lower cholesterol diet.  Pt denies worsening depressive symptoms, suicidal ideation or panic. No fever, night sweats, wt loss, loss of appetite, or other constitutional symptoms.  Pt states good ability with ADL's, has low fall risk, home safety reviewed and adequate, no other significant changes in hearing or vision, and only occasionally active with exercise.  Asks for increased dose of gabapentin for cont'd burning let pain. Sees Dr Janice Norrie of urology, has chronic catheter as he has deferred prostate surgury due to risk of impotence..  Pt deferring back surgury for now. Past Medical History  Diagnosis Date  . Hypertension   . Dysuria   . UTI (urinary tract infection)   . Tuberculosis     h/o PPD +  . BPH (benign prostatic hyperplasia)   . Anemia     normal Fe, nl B12, nl retic, nl EPO July '13  . Chronic kidney disease     CKD III, obstructive nephropathy  . Elevated PSA, greater than or equal to 20 ng/ml June '13    PSA 107  . Chronic back pain    Past Surgical History  Procedure Laterality Date  . Spleenectomy      reports that he has quit smoking. His smoking use included Cigarettes. He smoked 0.00 packs per day. He has never used smokeless tobacco. He reports that he does not drink alcohol or use illicit drugs. family history includes Diabetes in his mother; Hearing loss in his brother; Stroke in his brother. No Known Allergies Current Outpatient Prescriptions on File Prior to Visit    Medication Sig Dispense Refill  . amLODipine (NORVASC) 5 MG tablet Take 5 mg by mouth daily.      Marland Kitchen aspirin 81 MG tablet Take 81 mg by mouth daily.      . finasteride (PROSCAR) 5 MG tablet Take 5 mg by mouth daily.      Marland Kitchen labetalol (NORMODYNE) 200 MG tablet Take 1 tablet (200 mg total) by mouth every morning.  90 tablet  3  . lidocaine (XYLOCAINE) 5 % ointment Apply 1 application topically as needed.       No current facility-administered medications on file prior to visit.    Review of Systems  Constitutional: Negative for unusual diaphoresis or other sweats  HENT: Negative for ringing in ear Eyes: Negative for double vision or worsening visual disturbance.  Respiratory: Negative for choking and stridor.   Gastrointestinal: Negative for vomiting or other signifcant bowel change Genitourinary: Negative for hematuria or decreased urine volume.  Musculoskeletal: Negative for other MSK pain or swelling Skin: Negative for color change and worsening wound.  Neurological: Negative for tremors and numbness other than noted  Psychiatric/Behavioral: Negative for decreased concentration or agitation other than above       Objective:   Physical Exam BP 120/88  Pulse 89  Temp(Src) 98.4 F (36.9 C) (Oral)  Ht 6\' 2"  (1.88 m)  Wt 264 lb (119.75 kg)  BMI 33.88 kg/m2  SpO2 95% VS noted,  Constitutional: Pt appears well-developed, well-nourished.  HENT: Head: NCAT.  Right Ear: External ear normal.  Left Ear: External ear normal.  Eyes: . Pupils are equal, round, and reactive to light. Conjunctivae and EOM are normal Neck: Normal range of motion. Neck supple.  Cardiovascular: Normal rate and regular rhythm.   Pulmonary/Chest: Effort normal and breath sounds normal.  Abd:  Soft, NT, ND, + BS Neurological: Pt is alert. Not confused , motor grossly intact Skin: Skin is warm. No rash Psychiatric: Pt behavior is normal. No agitation.     Assessment & Plan:

## 2013-12-04 NOTE — Progress Notes (Signed)
Pre visit review using our clinic review tool, if applicable. No additional management support is needed unless otherwise documented below in the visit note. 

## 2013-12-05 ENCOUNTER — Encounter: Payer: Self-pay | Admitting: Internal Medicine

## 2013-12-05 ENCOUNTER — Other Ambulatory Visit: Payer: Self-pay | Admitting: Internal Medicine

## 2013-12-05 DIAGNOSIS — N184 Chronic kidney disease, stage 4 (severe): Secondary | ICD-10-CM

## 2013-12-05 MED ORDER — ATORVASTATIN CALCIUM 10 MG PO TABS: 10.0000 mg | ORAL_TABLET | Freq: Every day | ORAL | Status: DC

## 2013-12-10 DIAGNOSIS — R339 Retention of urine, unspecified: Secondary | ICD-10-CM | POA: Diagnosis not present

## 2013-12-10 NOTE — Assessment & Plan Note (Signed)
stable overall by history and exam,, and pt to continue medical treatment as before,  to f/u any worsening symptoms or concerns, for fu lab, may need nephrology referral

## 2013-12-10 NOTE — Assessment & Plan Note (Signed)
stable overall by history and exam, recent data reviewed with pt, and pt to continue medical treatment as before,  to f/u any worsening symptoms or concerns BP Readings from Last 3 Encounters:  12/04/13 120/88  09/04/13 130/80  08/21/13 164/110

## 2013-12-25 DIAGNOSIS — M5030 Other cervical disc degeneration, unspecified cervical region: Secondary | ICD-10-CM | POA: Diagnosis not present

## 2013-12-25 DIAGNOSIS — M48062 Spinal stenosis, lumbar region with neurogenic claudication: Secondary | ICD-10-CM | POA: Diagnosis not present

## 2013-12-25 DIAGNOSIS — M545 Low back pain, unspecified: Secondary | ICD-10-CM | POA: Diagnosis not present

## 2013-12-25 DIAGNOSIS — M47817 Spondylosis without myelopathy or radiculopathy, lumbosacral region: Secondary | ICD-10-CM | POA: Diagnosis not present

## 2013-12-25 DIAGNOSIS — M542 Cervicalgia: Secondary | ICD-10-CM | POA: Diagnosis not present

## 2013-12-25 DIAGNOSIS — M5137 Other intervertebral disc degeneration, lumbosacral region: Secondary | ICD-10-CM | POA: Diagnosis not present

## 2014-01-07 DIAGNOSIS — N401 Enlarged prostate with lower urinary tract symptoms: Secondary | ICD-10-CM | POA: Diagnosis not present

## 2014-01-07 DIAGNOSIS — R339 Retention of urine, unspecified: Secondary | ICD-10-CM | POA: Diagnosis not present

## 2014-01-07 DIAGNOSIS — N138 Other obstructive and reflux uropathy: Secondary | ICD-10-CM | POA: Diagnosis not present

## 2014-01-18 ENCOUNTER — Telehealth: Payer: Self-pay | Admitting: *Deleted

## 2014-01-18 MED ORDER — AMLODIPINE BESYLATE 5 MG PO TABS: 5.0000 mg | ORAL_TABLET | Freq: Every day | ORAL | Status: DC

## 2014-01-18 MED ORDER — FINASTERIDE 5 MG PO TABS: 5.0000 mg | ORAL_TABLET | Freq: Every day | ORAL | Status: DC

## 2014-01-18 NOTE — Telephone Encounter (Signed)
Left msg on triage requesting refills on his amlodipine & furosemide sent to [facility]...[name]

## 2014-01-18 NOTE — Telephone Encounter (Signed)
Notified pt clarified if he was needing furosemide. Pt is need finesteride. Inform pt will send to [facility]...Johny Chess

## 2014-02-07 DIAGNOSIS — R339 Retention of urine, unspecified: Secondary | ICD-10-CM | POA: Diagnosis not present

## 2014-03-07 DIAGNOSIS — R339 Retention of urine, unspecified: Secondary | ICD-10-CM | POA: Diagnosis not present

## 2014-04-11 DIAGNOSIS — R339 Retention of urine, unspecified: Secondary | ICD-10-CM | POA: Diagnosis not present

## 2014-05-09 DIAGNOSIS — N2581 Secondary hyperparathyroidism of renal origin: Secondary | ICD-10-CM | POA: Diagnosis not present

## 2014-05-09 DIAGNOSIS — Z23 Encounter for immunization: Secondary | ICD-10-CM | POA: Diagnosis not present

## 2014-05-09 DIAGNOSIS — D631 Anemia in chronic kidney disease: Secondary | ICD-10-CM | POA: Diagnosis not present

## 2014-05-09 DIAGNOSIS — N184 Chronic kidney disease, stage 4 (severe): Secondary | ICD-10-CM | POA: Diagnosis not present

## 2014-05-09 DIAGNOSIS — N189 Chronic kidney disease, unspecified: Secondary | ICD-10-CM | POA: Diagnosis not present

## 2014-05-10 ENCOUNTER — Telehealth: Payer: Self-pay | Admitting: Internal Medicine

## 2014-05-10 DIAGNOSIS — R339 Retention of urine, unspecified: Secondary | ICD-10-CM | POA: Diagnosis not present

## 2014-05-10 MED ORDER — AMLODIPINE BESYLATE 5 MG PO TABS: 5.0000 mg | ORAL_TABLET | Freq: Every day | ORAL | Status: DC

## 2014-05-10 MED ORDER — LABETALOL HCL 200 MG PO TABS: 200.0000 mg | ORAL_TABLET | Freq: Every morning | ORAL | Status: DC

## 2014-05-10 NOTE — Telephone Encounter (Signed)
Pt came by requesting a refill on his BP med.

## 2014-05-23 ENCOUNTER — Encounter: Payer: Self-pay | Admitting: Neurology

## 2014-05-23 ENCOUNTER — Ambulatory Visit (INDEPENDENT_AMBULATORY_CARE_PROVIDER_SITE_OTHER): Payer: Medicare Other | Admitting: Neurology

## 2014-05-23 VITALS — BP 126/85 | HR 65 | Ht 72.0 in | Wt 260.0 lb

## 2014-05-23 DIAGNOSIS — M4806 Spinal stenosis, lumbar region: Secondary | ICD-10-CM | POA: Diagnosis not present

## 2014-05-23 DIAGNOSIS — M5416 Radiculopathy, lumbar region: Secondary | ICD-10-CM | POA: Diagnosis not present

## 2014-05-23 DIAGNOSIS — I10 Essential (primary) hypertension: Secondary | ICD-10-CM | POA: Diagnosis not present

## 2014-05-23 DIAGNOSIS — M48061 Spinal stenosis, lumbar region without neurogenic claudication: Secondary | ICD-10-CM

## 2014-05-23 MED ORDER — GABAPENTIN 300 MG PO CAPS: 300.0000 mg | ORAL_CAPSULE | Freq: Three times a day (TID) | ORAL | Status: DC

## 2014-05-23 NOTE — Progress Notes (Signed)
PATIENT: [name] DOB: 06/24/1949  HISTORICAL (initial visit June 2015)  [name] is a 63 years old right-handed male, referred by his podiatrist Dr. Elby Showers, and the primary care physician Dr. 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  He complains of 2 years history of difficulty initiating urination, has been Self-Cath since 2013, he also complains of worsening low back pain, shooting to bilateral lower extremities, getting worse after prolonged walking,  Complains of bilateral feet paresthesia, starting at the bottom of his feet, now ascending to midshin level,  EMG/NCS in June 2015: Evidence of chronic neuropathic changes involving bilateral lumbosacral myotomes, L4 L5-S1, consistent with chronic bilateral lumbar radiculopathies, There was no evidence of large fiber peripheral neuropathy  MRI lumbar:   L4-5: disc bulging, facet hypertrophy, with moderate-severe spinal stenosis, moderate right and moderate-severe left foraminal stenosis  L3-4: disc bulging, facet and ligamentum flavum hypertrophy, with mild spinal stenosis, moderate right and moderate-severe left foraminal stenosis  L5-S1: disc bulging and facet hypertrophy with mild-moderate biforaminal stenosis  Compared to MRI on 05/12/12, there has been some progression of degenerative spine disease at L3-4 and L4-5 levels.  MRI cervical spine:  C6-7: disc bulging and uncovertebral joint hypertrophy with moderate right foraminal stenosis   C7-T1: disc bulging and facet hypertrophy with mild biforaminal stenosis   Laboratory evaluation showed normal or negative I26, RPR, HIV, folic acid, ESR, C-reactive protein, thyroid function test  Neurosurgical  refer in November 20 2013 by Dr. Rita Ohara, he is not a good surgical candidate  He is also under the care of urologist Dr. Janice Norrie, planning on to have suprapubic catheter, he now has indwelling Foley Catheter  He has mild slow worsening gait difficulty, due to low back pain, bilateral knee pain, bilateral lower extremity paresthesia burning pain, swelling,    REVIEW OF SYSTEMS: Full 14 system review of systems performed and notable only for chill, fatigue, light sensitivity, abdominal pain, restless leg, heat intolerance, difficulty urinating, joints pain, low back pain, walking difficulty, weakness  ALLERGIES: No Known Allergies  HOME MEDICATIONS: Current Outpatient Prescriptions on File Prior to Visit  Medication Sig Dispense Refill  . amLODipine (NORVASC) 5 MG tablet Take 5 mg by mouth daily.      Marland Kitchen amoxicillin (AMOXIL) 500 MG capsule Take 1 capsule (500 mg total) by mouth 3 (three) times daily.  21 capsule  0  . aspirin 81 MG tablet Take 81 mg by mouth daily.      . finasteride (PROSCAR) 5 MG tablet Take 5 mg by mouth daily.      Marland Kitchen gabapentin (NEURONTIN) 100 MG capsule Take 100 mg by mouth daily as needed.      Marland Kitchen HYDROcodone-homatropine (HYDROMET) 5-1.5 MG/5ML syrup Take 5 mLs by mouth every 6 (six) hours as needed for cough.  120 mL  0  . labetalol (NORMODYNE) 200 MG tablet Take 1 tablet (200 mg total) by mouth every morning.  90 tablet  3     PAST MEDICAL HISTORY: Past Medical History  Diagnosis Date  . Hypertension   . Dysuria   . UTI (urinary tract infection)   . Tuberculosis     h/o PPD +  . BPH (benign prostatic hyperplasia)   . Anemia  normal Fe, nl B12, nl retic, nl EPO July '13  . Chronic kidney disease     CKD III, obstructive nephropathy  . Elevated PSA, greater than or equal to 20 ng/ml June '13    PSA 107  . Chronic back pain     PAST SURGICAL HISTORY: Past Surgical History  Procedure Laterality Date  . Spleenectomy      FAMILY HISTORY: Family History    Problem Relation Age of Onset  . Diabetes Mother   . Stroke Brother   . Hearing loss Brother     SOCIAL HISTORY:  History   Social History  . Marital Status: Married    Spouse Name: N/A    Number of Children: 2  . Years of Education: 14   Occupational History    Unemployed, Professor   Social History Main Topics  . Smoking status: Former Smoker    Types: Cigarettes  . Smokeless tobacco: Never Used     Comment: Quit 1981  . Alcohol Use: No     Comment: Quit 2004  . Drug Use: No  . Sexual Activity: No   Other Topics Concern  . Not on file   Social History Narrative   Native of [place], raised poor farming community. . To [place] '78 - finished College, all but Arts administrator. Married - wife in [place], blocked from [place] to [place]. 1 son '90; 1 dtr '93. 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 Resources depleted.   Right handed.   Caffeine Hot daily one daily.   PHYSICAL EXAM   Filed Vitals:   05/23/14 1131  BP: 126/85  Pulse: 65  Height: 6' (1.829 m)  Weight: 260 lb (117.935 kg)    Not recorded      Body mass index is 35.25 kg/(m^2).   Generalized: In no acute distress  Neck: Supple, no carotid bruits   Cardiac: Regular rate rhythm  Pulmonary: Clear to auscultation bilaterally  Musculoskeletal: No deformity  Neurological examination  Mentation: Alert oriented to time, place, history taking, and causual conversation  Cranial nerve II-XII: Pupils were equal round reactive to light. Extraocular movements were full.  Visual field were full on confrontational test. Bilateral fundi were sharp.  Facial sensation and strength were normal. Hearing was intact to finger rubbing bilaterally. Uvula tongue midline.  Head turning and shoulder shrug and were normal and symmetric.Tongue protrusion into cheek strength was normal.  Motor: Normal tone, bulk and strength, exception of mild bilateral toe extension weakness  Sensory: Length  dependent decreased fine touch, pinprick to mid shin level, preserved toe vibratory sensation, and proprioception at toes.  Coordination: Normal finger to nose, heel-to-shin bilaterally there was no truncal ataxia  Gait: Need to push on chair arm to get up from seated position, cautious, bilateral valgrus knee, atalgic Romberg signs: Negative  Deep tendon reflexes: Brachioradialis3/3, biceps 3/3, triceps 2/2, patellar 3/3, Achilles trace, plantar responses were flexor bilaterally.   DIAGNOSTIC DATA (LABS, IMAGING, TESTING) - I reviewed patient records, labs, notes, testing and imaging myself where available.  Lab Results  Component Value Date   WBC 6.7 12/04/2013   HGB 13.7 12/04/2013   HCT 41.7 12/04/2013   MCV 86.9 12/04/2013   PLT 274.0 12/04/2013      Component Value Date/Time   NA 137 12/04/2013 1647   K 3.3* 12/04/2013 1647   CL 99 12/04/2013 1647   CO2 29 12/04/2013 1647   GLUCOSE 140* 12/04/2013 1647   BUN 32* 12/04/2013  1647   CREATININE 2.6* 12/04/2013 1647   CALCIUM 9.2 12/04/2013 1647   PROT 7.8 12/04/2013 1647   PROT 7.4 11/06/2013 1221   ALBUMIN 3.8 12/04/2013 1647   AST 18 12/04/2013 1647   ALT 13 12/04/2013 1647   ALKPHOS 82 12/04/2013 1647   BILITOT 0.5 12/04/2013 1647   GFRNONAA 27* 08/02/2013 1520   GFRAA 31* 08/02/2013 1520   Lab Results  Component Value Date   CHOL 210* 12/04/2013   HDL 48.90 12/04/2013   LDLCALC 135* 12/04/2013   TRIG 129.0 12/04/2013   CHOLHDL 4 12/04/2013   Lab Results  Component Value Date   HGBA1C 5.7 12/04/2013   Lab Results  Component Value Date   VITAMINB12 626 11/06/2013   Lab Results  Component Value Date   TSH 1.36 12/04/2013    ASSESSMENT AND PLAN  [name] is a 63 y.o. male complains of chronic low back pain, unsteady gait,, radiating pain to bilateral lower extremity, bilateral feet paresthesia, he has urinary incontinence, electrodiagnostic study showed evidence of chronic bilateral lumbosacral  radiculopathy, MRI of lumbar spine showed L4-5, L3-4 moderate to severe stenosis, he was evaluated by Dr. Sherwood Gambler, not a good surgical candidate, he is also under the care of urologist Dr. Janice Norrie, planning on suprapubic catheter, he had indwelling [place] catheter since 2013  He continues to complain of low back pain, radiating pain to bilateral lower extremity, consistent with chronic lumbar radiculopathy, also bilateral knee pain  Refer him to physical therapy Increase gabapentin to 300 mg 3 times a day Return to clinic with Hoyle Sauer in 6 months   Marcial Pacas, M.D. Ph.D.  [facility] [address] [place], [place] [address] [phone]

## 2014-05-29 ENCOUNTER — Encounter: Payer: Self-pay | Admitting: Internal Medicine

## 2014-05-29 ENCOUNTER — Ambulatory Visit (INDEPENDENT_AMBULATORY_CARE_PROVIDER_SITE_OTHER): Payer: Medicare Other | Admitting: Internal Medicine

## 2014-05-29 ENCOUNTER — Other Ambulatory Visit (INDEPENDENT_AMBULATORY_CARE_PROVIDER_SITE_OTHER): Payer: Medicare Other

## 2014-05-29 ENCOUNTER — Encounter: Payer: Self-pay | Admitting: Physical Therapy

## 2014-05-29 ENCOUNTER — Ambulatory Visit: Payer: Medicare Other | Attending: Neurology | Admitting: Physical Therapy

## 2014-05-29 ENCOUNTER — Other Ambulatory Visit: Payer: Self-pay | Admitting: Internal Medicine

## 2014-05-29 VITALS — BP 130/88 | HR 61 | Temp 98.1°F | Ht 74.0 in | Wt 256.5 lb

## 2014-05-29 DIAGNOSIS — R269 Unspecified abnormalities of gait and mobility: Secondary | ICD-10-CM

## 2014-05-29 DIAGNOSIS — E785 Hyperlipidemia, unspecified: Secondary | ICD-10-CM | POA: Diagnosis not present

## 2014-05-29 DIAGNOSIS — K649 Unspecified hemorrhoids: Secondary | ICD-10-CM | POA: Diagnosis not present

## 2014-05-29 DIAGNOSIS — N390 Urinary tract infection, site not specified: Secondary | ICD-10-CM | POA: Insufficient documentation

## 2014-05-29 DIAGNOSIS — M545 Low back pain: Secondary | ICD-10-CM | POA: Diagnosis not present

## 2014-05-29 DIAGNOSIS — R6889 Other general symptoms and signs: Secondary | ICD-10-CM | POA: Insufficient documentation

## 2014-05-29 DIAGNOSIS — I10 Essential (primary) hypertension: Secondary | ICD-10-CM

## 2014-05-29 DIAGNOSIS — R7309 Other abnormal glucose: Secondary | ICD-10-CM

## 2014-05-29 HISTORY — DX: Hyperlipidemia, unspecified: E78.5

## 2014-05-29 LAB — BASIC METABOLIC PANEL
BUN: 28 mg/dL — ABNORMAL HIGH (ref 6–23)
CALCIUM: 9.1 mg/dL (ref 8.4–10.5)
CO2: 31 mEq/L (ref 19–32)
Chloride: 101 mEq/L (ref 96–112)
Creatinine, Ser: 2.1 mg/dL — ABNORMAL HIGH (ref 0.4–1.5)
GFR: 41.43 mL/min — AB (ref >60.00)
Glucose, Bld: 82 mg/dL (ref 70–99)
POTASSIUM: 3.9 meq/L (ref 3.5–5.1)
SODIUM: 140 meq/L (ref 135–145)

## 2014-05-29 LAB — HEPATIC FUNCTION PANEL
ALK PHOS: 79 U/L (ref 39–117)
ALT: 17 U/L (ref 0–53)
AST: 19 U/L (ref 0–37)
Albumin: 3.8 g/dL (ref 3.5–5.2)
BILIRUBIN TOTAL: 0.7 mg/dL (ref 0.2–1.2)
Bilirubin, Direct: 0.1 mg/dL (ref 0.0–0.3)
Total Protein: 7.5 g/dL (ref 6.0–8.3)

## 2014-05-29 LAB — HEMOGLOBIN A1C: HEMOGLOBIN A1C: 6 % (ref 4.6–6.5)

## 2014-05-29 LAB — LIPID PANEL
CHOL/HDL RATIO: 5
Cholesterol: 215 mg/dL — ABNORMAL HIGH (ref 0–200)
HDL: 40.9 mg/dL (ref >39.00)
LDL CALC: 160 mg/dL — AB (ref 0–99)
NonHDL: 174.1
Triglycerides: 69 mg/dL (ref 0.0–149.0)
VLDL: 13.8 mg/dL (ref 0.0–40.0)

## 2014-05-29 MED ORDER — OXYBUTYNIN CHLORIDE 5 MG PO TABS: 5.0000 mg | ORAL_TABLET | Freq: Every day | ORAL | Status: DC

## 2014-05-29 MED ORDER — ATORVASTATIN CALCIUM 20 MG PO TABS: 20.0000 mg | ORAL_TABLET | Freq: Every day | ORAL | Status: DC

## 2014-05-29 NOTE — Patient Instructions (Addendum)
Please continue all other medications as before, and refills have been done if requested.  Please have the pharmacy call with any other refills you may need.  Please continue your efforts at being more active, low cholesterol diet, and weight control.  You are otherwise up to date with prevention measures today.  Please keep your appointments with your specialists as you may have planned  You will be contacted regarding the referral for: Gastroenterology (for office visit)  Please go to the LAB in the Basement (turn left off the elevator) for the tests to be done today  You will be contacted by phone if any changes need to be made immediately.  Otherwise, you will receive a letter about your results with an explanation, but please check with MyChart first.  Please remember to sign up for MyChart if you have not done so, as this will be important to you in the future with finding out test results, communicating by private email, and scheduling acute appointments online when needed.  Please return in 6 months, or sooner if needed

## 2014-05-29 NOTE — Progress Notes (Signed)
Subjective:    Patient ID: [name], male    DOB: 06/24/1949, 63 y.o.   MRN: [id number]  HPI  Here to f/u; overall doing ok,  Pt denies chest pain, increased sob or doe, wheezing, orthopnea, PND, increased LE swelling, palpitations, dizziness or syncope.  Pt denies polydipsia, polyuria, or low sugar symptoms such as weakness or confusion improved with po intake.  Pt denies new neurological symptoms such as new headache, or facial or extremity weakness or numbness.   Pt states overall good compliance with meds, has been trying to follow lower cholesterol, diabetic diet, with wt overall stable,  but little exercise however. No new complaints Has seen Dr Webb/nephrology, f/u renal labs some improved per pt report today.  Did see Dr Lahoma Crocker as well, for PT only, consider ESI in future, but to f/u prn. Past Medical History  Diagnosis Date  . Hypertension   . Dysuria   . UTI (urinary tract infection)   . Tuberculosis     h/o PPD +  . BPH (benign prostatic hyperplasia)   . Anemia     normal Fe, nl B12, nl retic, nl EPO July '13  . Chronic kidney disease     CKD III, obstructive nephropathy  . Elevated PSA, greater than or equal to 20 ng/ml June '13    PSA 107  . Chronic back pain   . Hyperlipidemia 05/29/2014   Past Surgical History  Procedure Laterality Date  . Spleenectomy      reports that he has quit smoking. His smoking use included Cigarettes. He smoked 0.00 packs per day. He has never used smokeless tobacco. He reports that he does not drink alcohol or use illicit drugs. family history includes Diabetes in his mother; Hearing loss in his brother; Stroke in his brother. No Known Allergies Current Outpatient Prescriptions on File Prior to Visit  Medication Sig Dispense Refill  . amLODipine (NORVASC) 5 MG tablet Take 1 tablet (5 mg total) by mouth daily. 90 tablet 3  . aspirin 81 MG tablet Take 81 mg by mouth daily.    . finasteride (PROSCAR) 5 MG tablet Take 1 tablet (5 mg  total) by mouth daily. 90 tablet 2  . gabapentin (NEURONTIN) 300 MG capsule Take 1 capsule (300 mg total) by mouth 3 (three) times daily. 90 capsule 11  . labetalol (NORMODYNE) 200 MG tablet Take 1 tablet (200 mg total) by mouth every morning. 90 tablet 3  . lidocaine (XYLOCAINE) 5 % ointment Apply 1 application topically as needed.     No current facility-administered medications on file prior to visit.     Review of Systems  Constitutional: Negative for unusual diaphoresis or other sweats  HENT: Negative for ringing in ear Eyes: Negative for double vision or worsening visual disturbance.  Respiratory: Negative for choking and stridor.   Gastrointestinal: Negative for vomiting or other signifcant bowel change Genitourinary: Negative for hematuria or decreased urine volume.  Musculoskeletal: Negative for other MSK pain or swelling Skin: Negative for color change and worsening wound.  Neurological: Negative for tremors and numbness other than noted  Psychiatric/Behavioral: Negative for decreased concentration or agitation other than above       Objective:   Physical Exam BP 130/88 mmHg  Pulse 61  Temp(Src) 98.1 F (36.7 C) (Oral)  Ht 6\' 2"  (1.88 m)  Wt 256 lb 8 oz (116.348 kg)  BMI 32.92 kg/m2  SpO2 92% VS noted,  Constitutional: Pt appears well-developed, well-nourished.  HENT: Head: NCAT.  Right Ear: External ear normal.  Left Ear: External ear normal.  Eyes: . Pupils are equal, round, and reactive to light. Conjunctivae and EOM are normal Neck: Normal range of motion. Neck supple.  Cardiovascular: Normal rate and regular rhythm.   Pulmonary/Chest: Effort normal and breath sounds without rales or wheezing.  Neurological: Pt is alert. Not confused , motor grossly intact Skin: Skin is warm. No rash Psychiatric: Pt behavior is normal. No agitation.     Assessment & Plan:

## 2014-05-29 NOTE — Progress Notes (Signed)
Pre visit review using our clinic review tool, if applicable. No additional management support is needed unless otherwise documented below in the visit note. 

## 2014-05-29 NOTE — Therapy (Signed)
[facility] [address] [place], [place], [address] Phone: [phone]   Fax:  [phone]  Physical Therapy Evaluation  Patient Details  Name: [name] MRN: [id number] Date of Birth: 06/24/1949  Encounter Date: 05/29/2014      PT End of Session - 05/29/14 1720    Visit Number 1  G1   Number of Visits 9   Date for PT Re-Evaluation 07/29/14   Authorization Type Medicare requires G-code every 10th visit   PT Start Time 1108   PT Stop Time 1145   PT Time Calculation (min) 37 min      Past Medical History  Diagnosis Date  . Hypertension   . Dysuria   . UTI (urinary tract infection)   . Tuberculosis     h/o PPD +  . BPH (benign prostatic hyperplasia)   . Anemia     normal Fe, nl B12, nl retic, nl EPO July '13  . Chronic kidney disease     CKD III, obstructive nephropathy  . Elevated PSA, greater than or equal to 20 ng/ml June '13    PSA 107  . Chronic back pain   . Hyperlipidemia 05/29/2014    Past Surgical History  Procedure Laterality Date  . Spleenectomy      There were no vitals taken for this visit.  Visit Diagnosis:  Abnormality of gait  Bilateral low back pain, with sciatica presence unspecified  Decreased activity tolerance      Subjective Assessment - 05/29/14 1115    Symptoms Pt is a 63 year old male who presents to OP PT with lower extremity swelling, neuropathy, pain in legs, difficulty walking.  Pt c/o low back pain, with occasional radiating pain into buttocks.  Pt has had no falls, but pt does use cane.   Patient Stated Goals Pt's goal for therapy is to improve leg and back pain.   Currently in Pain? Yes   Pain Score 8    Pain Location Leg  also lateral side of R knee pt has pain   Pain Orientation Right;Left   Pain Descriptors / Indicators Aching   Pain Type Chronic pain   Pain Onset More than a month ago   Pain Frequency Constant   Aggravating Factors  Sitting too long  with fluid accumulating in lower legs   Pain Relieving Factors Elevating legs  PT to monitor pain, will attempt to address through exercises and postural education   Multiple Pain Sites Yes   Pain Score 7   Pain Location Back   Pain Orientation Right;Left;Lower   Pain Descriptors / Indicators Aching   Pain Frequency Constant  Aggravated by sitting too long on hard surface ; alleviated by lying down          [facility] PT Assessment - 05/29/14 1123    Assessment   Medical Diagnosis low back pain; gait difficulty   Precautions   Precautions Fall  Avoid lifting heavy objects; has catheter   Balance Screen   Has the patient fallen in the past 6 months No   Has the patient had a decrease in activity level because of a fear of falling?  No   Is the patient reluctant to leave their home because of a fear of falling?  No   Home Environment   Living Enviornment Private residence   Living Arrangements Other (Comment)  Roommate   Available Help at Discharge Family   Type of [place] Access Level  entry   [place] Two level;Bed/bath upstairs   Alternate Level Stairs-Rails Left   Home Equipment Cane - single point   Observation/Other Assessments   Focus on Therapeutic Outcomes (FOTO)  FOTO intake score 50; Activities of balance confidence scale:  10%   Strength   Right Hip Flexion 4/5   Left Hip Extension 4/5   Right Knee Flexion 4/5   Right Knee Extension 4/5   Left Knee Flexion 4/5   Left Knee Extension 4/5   Right Ankle Dorsiflexion 5/5   Right Ankle Plantar Flexion 5/5   Ambulation/Gait   Ambulation/Gait Yes   Ambulation/Gait Assistance 5: Supervision  Pt performs gait testing without cane   Ambulation Distance (Feet) 200 Feet   Assistive device None  Presents to eval with cane; doesn't use for testing   Gait Pattern Wide base of support;Decreased stance time - right  R lateral lean, R knee valgus   Gait velocity 12.11 sec = 2.71 ft/sec   Standardized Balance  Assessment   Standardized Balance Assessment Timed Up and Go Test;Dynamic Gait Index   Dynamic Gait Index   Level Surface Mild Impairment   Change in Gait Speed Moderate Impairment   Gait with Horizontal Head Turns Mild Impairment   Gait with Vertical Head Turns Mild Impairment   Gait and Pivot Turn Moderate Impairment   Step Over Obstacle Moderate Impairment   Step Around Obstacles Mild Impairment   Steps Moderate Impairment   Total Score 12   Timed Up and Go Test   TUG Normal TUG   Normal TUG (seconds) 18.45                               PT Long Term Goals - 05/29/14 1724    PT LONG TERM GOAL #1   Title Pt will verbalize understanding of fall prevention in the home environment. (Target 06/28/14)   Time 4   Period Weeks   Status New   PT LONG TERM GOAL #2   Title Pt will verbalize/demonstrate understanding of body mechanics/posture with ADLs to address pain.   Time 4   Period Weeks   Status New   PT LONG TERM GOAL #3   Title Pt will be independent with HEP for improved strength, balance, gait.   Time 4   Period Weeks   Status New   PT LONG TERM GOAL #4   Title improve Timed Up and Go score to less than or equal to 13.5 seconds for decreased fall risk.   Time 4   Period Weeks   Status New   PT LONG TERM GOAL #5   Title improve Dynamic Gait index score to at least 17/24 for decreased fall risk.   Time 4   Period Weeks   Status New   Additional Long Term Goals   Additional Long Term Goals Yes   PT LONG TERM GOAL #6   Title Pt will improve Activities of Balance Confidence Scale by at least 10 points for improved balance and confidence with functional activities.   Time 4   Period Weeks   Status New               Plan - 05/29/14 1721    Clinical Impression Statement Pt is a 63 year old male who presents to OP with bilateral lower leg, R knee, and back pain.  He reports balance and walking difficulty.  He has had no falls,  but he uses a  cane.  He feels this started following hospitalization in 2012, but Dr. Krista Blue was concerned at most recent visit.     Pt will benefit from skilled therapeutic intervention in order to improve on the following deficits Abnormal gait;Decreased balance;Decreased mobility;Decreased strength;Difficulty walking;Pain   Rehab Potential Good   Clinical Impairments Affecting Rehab Potential longstanding back and leg pain (due to kidney disease per pt report)   PT Frequency 2x / week   PT Duration 4 weeks  plus evaluation   PT Treatment/Interventions ADLs/Self Care Home Management;Stair training;Gait training;Balance training;Neuromuscular re-education;Therapeutic exercise;Therapeutic activities;Patient/family education   PT Next Visit Plan Initiate HEP to address lower extremity strengthening, balance activities; body mechanics with ADLs to address pain   PT Home Exercise Plan lower extremity strengthening and balance   Consulted and Agree with Plan of Care Patient          G-Codes - 06/09/2014 1729    Functional Assessment Tool Used TUG (18.45 sec), Dynamic Gait Index 12/24, Activities of Balance confidence scale:  10%   Functional Limitation Mobility: Walking and moving around   Mobility: Walking and Moving Around Current Status [phone]) At least 40 percent but less than 60 percent impaired, limited or restricted   Mobility: Walking and Moving Around Goal Status [phone]) At least 20 percent but less than 40 percent impaired, limited or restricted       Problem List Patient Active Problem List   Diagnosis Date Noted  . Hemorrhoid June 09, 2014  . 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 Hyperlipidemia 06-09-2014  . Lumbar radiculopathy 05/23/2014  . Lumbar stenosis 11/20/2013  . Abnormal glucose 11/06/2013  . Unspecified hereditary and idiopathic peripheral neuropathy 01/22/2013  . 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 Chronic renal insufficiency, stage II (mild) 11/24/2011  . Hematuria 11/24/2011  .  Hydronephrosis 11/24/2011  . Anemia 11/24/2011  . BRADYCARDIA 02/05/2010  . TINEA PEDIS 01/29/2010  . 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 PERIPHERAL EDEMA 01/29/2010  . ABNORMAL ELECTROCARDIOGRAM 01/29/2010  . 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  . Osteoarthrosis, generalized, 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  . ONYCHOMYCOSIS, TOENAILS 10/02/2007  . BENIGN PROSTATIC HYPERTROPHY 10/02/2007  . Essential hypertension 03/06/2007    Bland Rudzinski W. 06/09/14, 5:31 PM   Mady Haagensen, PT 06/09/14 5:32 PM Phone: [phone] Fax: [facility] [address] [place], [place], [address] Phone: [phone]   Fax:  [phone]

## 2014-05-29 NOTE — Assessment & Plan Note (Signed)
Pt requetst GI referral, also due for colonscpoy for routine screening

## 2014-05-30 ENCOUNTER — Encounter: Payer: Self-pay | Admitting: Internal Medicine

## 2014-06-02 NOTE — Assessment & Plan Note (Signed)
stable overall by history and exam, recent data reviewed with pt, and pt to continue medical treatment as before,  to f/u any worsening symptoms or concerns, consider statin for LDL > 100 

## 2014-06-02 NOTE — Assessment & Plan Note (Signed)
stable overall by history and exam, recent data reviewed with pt, and pt to continue medical treatment as before,  to f/u any worsening symptoms or concerns Lab Results  Component Value Date   HGBA1C 6.0 05/29/2014

## 2014-06-02 NOTE — Assessment & Plan Note (Signed)
stable overall by history and exam, recent data reviewed with pt, and pt to continue medical treatment as before,  to f/u any worsening symptoms or concerns BP Readings from Last 3 Encounters:  05/29/14 130/88  05/23/14 126/85  12/04/13 120/88

## 2014-06-05 ENCOUNTER — Ambulatory Visit: Payer: Medicare Other | Admitting: Internal Medicine

## 2014-06-05 ENCOUNTER — Ambulatory Visit: Payer: Medicare Other | Admitting: Physical Therapy

## 2014-06-05 ENCOUNTER — Encounter: Payer: Self-pay | Admitting: Physical Therapy

## 2014-06-05 DIAGNOSIS — M545 Low back pain: Secondary | ICD-10-CM

## 2014-06-05 DIAGNOSIS — R6889 Other general symptoms and signs: Secondary | ICD-10-CM

## 2014-06-05 DIAGNOSIS — R269 Unspecified abnormalities of gait and mobility: Secondary | ICD-10-CM

## 2014-06-05 NOTE — Therapy (Signed)
[facility] [address] [place], [place], [address] Phone: [phone]   Fax:  [phone]  Physical Therapy Treatment  Patient Details  Name: [name] MRN: [id number] Date of Birth: 06/24/1949  Encounter Date: 06/05/2014      PT End of Session - 06/05/14 1627    Visit Number 2  G2   Number of Visits 9   Date for PT Re-Evaluation 07/29/14   Authorization Type Medicare requires G-code every 10th visit   PT Start Time 1319   PT Stop Time 1404   PT Time Calculation (min) 45 min   Activity Tolerance Patient tolerated treatment well      Past Medical History  Diagnosis Date  . Hypertension   . Dysuria   . UTI (urinary tract infection)   . Tuberculosis     h/o PPD +  . BPH (benign prostatic hyperplasia)   . Anemia     normal Fe, nl B12, nl retic, nl EPO July '13  . Chronic kidney disease     CKD III, obstructive nephropathy  . Elevated PSA, greater than or equal to 20 ng/ml June '13    PSA 107  . Chronic back pain   . Hyperlipidemia 05/29/2014    Past Surgical History  Procedure Laterality Date  . Spleenectomy      There were no vitals taken for this visit.  Visit Diagnosis:  Abnormality of gait  Bilateral low back pain, with sciatica presence unspecified  Decreased activity tolerance      Subjective Assessment - 06/05/14 1323    Symptoms Pt reports he's tried more walking without cane, and he seems to have more R knee pain   Currently in Pain? Yes   Pain Score 8    Pain Location Leg  R knee   Pain Orientation Right   Pain Descriptors / Indicators Aching   Pain Type Chronic pain   Aggravating Factors  sitting too long   Pain Relieving Factors medication                    [facility] Adult PT Treatment/Exercise - 06/05/14 1328    Exercises   Exercises Knee/Hip;Lumbar   Lumbar Exercises: Stretches   Pelvic Tilt 5 reps  supine with manual assistance   Pelvic Tilt Limitations  10 reps in sitting with manual cues   Lumbar Exercises: Aerobic   Stationary Bike SciFit, Level 1.2, 4 extremities x 7 minutes   Knee/Hip Exercises: Supine   Quad Sets AROM;Strengthening;10 reps;Right;Left   Quad Sets Limitations pain in R knee with knee flexion for repositioning   Short Arc Quad Sets AROM;Strengthening;Right;Left;10 reps   Heel Slides AROM;Right;Left;10 reps   Bridges 10 reps   Straight Leg Raises Right;5 reps  attempted-pt c/o back pain                PT Education - 06/05/14 1626    Education provided Yes   Education Details HEP-See instructions   Person(s) Educated Patient   Methods Explanation;Demonstration;Handout   Comprehension Verbalized understanding;Returned demonstration             PT Long Term Goals - 05/29/14 1724    PT LONG TERM GOAL #1   Title Pt will verbalize understanding of fall prevention in the home environment. (Target 06/28/14)   Time 4   Period Weeks   Status New   PT LONG TERM GOAL #2   Title Pt will verbalize/demonstrate understanding of body mechanics/posture with ADLs  to address pain.   Time 4   Period Weeks   Status New   PT LONG TERM GOAL #3   Title Pt will be independent with HEP for improved strength, balance, gait.   Time 4   Period Weeks   Status New   PT LONG TERM GOAL #4   Title improve Timed Up and Go score to less than or equal to 13.5 seconds for decreased fall risk.   Time 4   Period Weeks   Status New   PT LONG TERM GOAL #5   Title improve Dynamic Gait index score to at least 17/24 for decreased fall risk.   Time 4   Period Weeks   Status New   Additional Long Term Goals   Additional Long Term Goals Yes   PT LONG TERM GOAL #6   Title Pt will improve Activities of Balance Confidence Scale by at least 10 points for improved balance and confidence with functional activities.   Time 4   Period Weeks   Status New               Plan - 06/05/14 1628    Clinical Impression Statement  Iniated HEP today to address lower extremity strength as well as back flexibility.  He reports knee pain continues during exercises, but does not increase.  Pt noted to have decreased pelvic mobility with attempts at pelvic tilts.   Pt will benefit from skilled therapeutic intervention in order to improve on the following deficits Abnormal gait;Decreased balance;Decreased mobility;Decreased strength;Difficulty walking;Pain   Rehab Potential Good   Clinical Impairments Affecting Rehab Potential longstanding back and leg pain (due to kidney disease per pt report)   PT Frequency 2x / week   PT Duration 4 weeks  wk 1 of 4   PT Treatment/Interventions ADLs/Self Care Home Management;Stair training;Gait training;Balance training;Neuromuscular re-education;Therapeutic exercise;Therapeutic activities;Patient/family education   PT Next Visit Plan Review HEP; continue progressing strengthening and balance exercises; discuss posture and body mechanics   Consulted and Agree with Plan of Care Patient        Problem List Patient Active Problem List   Diagnosis Date Noted  . Hemorrhoid 05/29/2014  . 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 Hyperlipidemia 05/29/2014  . Lumbar radiculopathy 05/23/2014  . Lumbar stenosis 11/20/2013  . Abnormal glucose 11/06/2013  . Unspecified hereditary and idiopathic peripheral neuropathy 01/22/2013  . 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 Chronic renal insufficiency, stage II (mild) 11/24/2011  . Hematuria 11/24/2011  . Hydronephrosis 11/24/2011  . Anemia 11/24/2011  . BRADYCARDIA 02/05/2010  . TINEA PEDIS 01/29/2010  . 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 PERIPHERAL EDEMA 01/29/2010  . ABNORMAL ELECTROCARDIOGRAM 01/29/2010  . 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  . Osteoarthrosis, generalized, 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  . ONYCHOMYCOSIS, TOENAILS 10/02/2007  . BENIGN PROSTATIC HYPERTROPHY 10/02/2007  . Essential hypertension 03/06/2007    MARRIOTT,AMY  W. 06/05/2014, 4:34 PM   Mady Haagensen, PT 06/05/2014 4:35 PM Phone: [phone] Fax: [facility] [address] [place], [place], [address] Phone: [phone]   Fax:  [phone]

## 2014-06-05 NOTE — Patient Instructions (Signed)
KNEE: Extension (Isometric)   Lie on back, legs straight. Tighten quadriceps by pushing back of knee into surface. Hold _3 seconds. _10 reps per set, _2 sets per day   Copyright  VHI. All rights reserved.  KNEE: Extension, Short Arc Quads - Supine   Place bolster (folded up towel or pillow) under knees. Raise one leg until knee is straight. _10__ reps per set, _2__ sets per day  Copyright  VHI. All rights reserved.  Heel Slides   Slide left heel along bed towards bottom. Hold for _4__ seconds. Slide back to flat knee position. Repeat 10__ times. Do _2_ times a day. Repeat with other leg.    Copyright  VHI. All rights reserved.  Bridging   Slowly raise buttocks from floor, keeping stomach tight. Repeat __10__ times per set. Do _2__ sets per session. Do __1-2__ sessions per day.  http://orth.exer.us/1096   Copyright  VHI. All rights reserved.  PELVIC TILT: Anterior   Start in slumped position. Roll pelvis forward to arch back. 10___ reps per set, 2___ sets per day  Copyright  VHI. All rights reserved.

## 2014-06-06 ENCOUNTER — Ambulatory Visit: Payer: Medicare Other | Admitting: Physical Therapy

## 2014-06-06 ENCOUNTER — Encounter: Payer: Self-pay | Admitting: Physical Therapy

## 2014-06-06 DIAGNOSIS — R269 Unspecified abnormalities of gait and mobility: Secondary | ICD-10-CM

## 2014-06-06 DIAGNOSIS — R6889 Other general symptoms and signs: Secondary | ICD-10-CM

## 2014-06-06 DIAGNOSIS — M545 Low back pain: Secondary | ICD-10-CM

## 2014-06-06 NOTE — Therapy (Signed)
[facility] [address] [place], [place], [address] Phone: [phone]   Fax:  [phone]  Physical Therapy Treatment  Patient Details  Name: [name] MRN: [id number] Date of Birth: 06/24/1949  Encounter Date: 06/06/2014      PT End of Session - 06/06/14 1125    Visit Number 3   Number of Visits 9   Date for PT Re-Evaluation 07/29/14   Authorization Type Medicare requires G-code every 10th visit   PT Start Time 0930   PT Stop Time 3614   PT Time Calculation (min) 44 min   Activity Tolerance Patient tolerated treatment well      Past Medical History  Diagnosis Date  . Hypertension   . Dysuria   . UTI (urinary tract infection)   . Tuberculosis     h/o PPD +  . BPH (benign prostatic hyperplasia)   . Anemia     normal Fe, nl B12, nl retic, nl EPO July '13  . Chronic kidney disease     CKD III, obstructive nephropathy  . Elevated PSA, greater than or equal to 20 ng/ml June '13    PSA 107  . Chronic back pain   . Hyperlipidemia 05/29/2014    Past Surgical History  Procedure Laterality Date  . Spleenectomy      There were no vitals taken for this visit.  Visit Diagnosis:  Abnormality of gait  Bilateral low back pain, with sciatica presence unspecified  Decreased activity tolerance      Subjective Assessment - 06/06/14 0936    Symptoms Pt sore from exercises and R knee painful   Pain Score 8    Pain Location Knee   Pain Orientation Right   Pain Descriptors / Indicators Aching   Pain Type Chronic pain   Pain Onset More than a month ago   Pain Frequency Constant   Pain Relieving Factors elevation   Pain Score 7   Pain Location Back   Pain Orientation Right;Lower   Pain Descriptors / Indicators Aching   Pain Frequency Constant          [facility] Adult PT Treatment/Exercise - 06/06/14 0940    Bed Mobility   Supine to Sit 5: Supervision;HOB flat   Supine to Sit Details (indicate cue  type and reason) cues for best body mechanics for back   Sit to Supine 5: Supervision   Sit to Supine - Details (indicate cue type and reason) cues for best body mechanics with back   Lumbar Exercises: Stretches   Single Knee to Chest Stretch 3 reps;30 seconds;Other (comment)  both sides   Lower Trunk Rotation 3 reps;20 seconds   Pelvic Tilt Other (comment)  10 reps with 5 second hold supine   Lumbar Exercises: Seated   Other Seated Lumbar Exercises pelvic tilts x 10 seated edge of mat   Knee/Hip Exercises: Supine   Quad Sets AROM;Strengthening;10 reps;Right;Left   Short Arc Quad Sets AROM;Strengthening;Right;Left;10 reps   Heel Slides AROM;Right;Left;10 reps   Bridges 10 reps   Straight Leg Raises Both;AROM;5 reps          PT Education - 06/06/14 1124    Education provided Yes   Education Details Continue HEP as tolerated, Body mechanics during bed mobility   Person(s) Educated Patient   Methods Explanation   Comprehension Verbalized understanding          PT Long Term Goals - 05/29/14 1724    PT LONG TERM GOAL #1  Title Pt will verbalize understanding of fall prevention in the home environment. (Target 06/28/14)   Time 4   Period Weeks   Status New   PT LONG TERM GOAL #2   Title Pt will verbalize/demonstrate understanding of body mechanics/posture with ADLs to address pain.   Time 4   Period Weeks   Status New   PT LONG TERM GOAL #3   Title Pt will be independent with HEP for improved strength, balance, gait.   Time 4   Period Weeks   Status New   PT LONG TERM GOAL #4   Title improve Timed Up and Go score to less than or equal to 13.5 seconds for decreased fall risk.   Time 4   Period Weeks   Status New   PT LONG TERM GOAL #5   Title improve Dynamic Gait index score to at least 17/24 for decreased fall risk.   Time 4   Period Weeks   Status New   Additional Long Term Goals   Additional Long Term Goals Yes   PT LONG TERM GOAL #6   Title Pt will improve  Activities of Balance Confidence Scale by at least 10 points for improved balance and confidence with functional activities.   Time 4   Period Weeks   Status New           Plan - 06/06/14 1126    Clinical Impression Statement Pt c/o some pain today but then describes as muscle soreness from "not using those muscles for a while.".  Continue PT per POC as pt tolerates   Pt will benefit from skilled therapeutic intervention in order to improve on the following deficits Abnormal gait;Decreased balance;Decreased mobility;Decreased strength;Difficulty walking;Pain   Rehab Potential Good   Clinical Impairments Affecting Rehab Potential longstanding back and leg pain (due to kidney disease per pt report)   PT Frequency 2x / week   PT Duration 4 weeks   PT Treatment/Interventions ADLs/Self Care Home Management;Stair training;Gait training;Balance training;Neuromuscular re-education;Therapeutic exercise;Therapeutic activities;Patient/family education   PT Next Visit Plan Continue strengthening and balance as tolerated.  Discuss posture and body mechanics.   Consulted and Agree with Plan of Care Patient        Problem List Patient Active Problem List   Diagnosis Date Noted  . Hemorrhoid 05/29/2014  . 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 Hyperlipidemia 05/29/2014  . Lumbar radiculopathy 05/23/2014  . Lumbar stenosis 11/20/2013  . Abnormal glucose 11/06/2013  . Unspecified hereditary and idiopathic peripheral neuropathy 01/22/2013  . 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 Chronic renal insufficiency, stage II (mild) 11/24/2011  . Hematuria 11/24/2011  . Hydronephrosis 11/24/2011  . Anemia 11/24/2011  . BRADYCARDIA 02/05/2010  . TINEA PEDIS 01/29/2010  . 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 PERIPHERAL EDEMA 01/29/2010  . ABNORMAL ELECTROCARDIOGRAM 01/29/2010  . 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  . Osteoarthrosis, generalized, 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  . ONYCHOMYCOSIS, TOENAILS  10/02/2007  . BENIGN PROSTATIC HYPERTROPHY 10/02/2007  . Essential hypertension 03/06/2007    Narda Bonds 06/06/2014, 11:30 AM  [facility] [address] [place], [place], [address] Phone: [phone]   Fax:  [place] [place] 06/06/2014 11:31 AM Phone: [phone] Fax: [phone]

## 2014-06-12 ENCOUNTER — Ambulatory Visit: Payer: Medicare Other | Attending: Neurology

## 2014-06-12 DIAGNOSIS — M545 Low back pain: Secondary | ICD-10-CM | POA: Diagnosis not present

## 2014-06-12 DIAGNOSIS — R6889 Other general symptoms and signs: Secondary | ICD-10-CM | POA: Diagnosis not present

## 2014-06-12 DIAGNOSIS — R269 Unspecified abnormalities of gait and mobility: Secondary | ICD-10-CM | POA: Diagnosis not present

## 2014-06-12 NOTE — Patient Instructions (Signed)
Supine Knee to Chest   Lie on back. Gently pull one knee toward opposide side of chest. Hold 30 seconds.  Repeat 3 times per session on each leg. Do 1-2 sessions per day.  Copyright  VHI. All rights reserved.  Piriformis (Supine)   Lay on back with knees bent and feet on bed. Cross the left ankle over the right knee. Press the left knee away from your body. Hold 30 seconds. Perform 3x on each leg.   http://orth.exer.us/676   Copyright  VHI. All rights reserved.

## 2014-06-12 NOTE — Therapy (Signed)
[facility] [address] [place], [place], [address] Phone: [phone]   Fax:  [phone]  Physical Therapy Treatment  Patient Details  Name: [name] MRN: [id number] Date of Birth: 06/24/1949  Encounter Date: 06/12/2014      PT End of Session - 06/12/14 1211    Visit Number 4   Number of Visits 9   Date for PT Re-Evaluation 07/29/14   Authorization Type Medicare requires G-code every 10th visit   PT Start Time 1017   PT Stop Time 1101   PT Time Calculation (min) 44 min      Past Medical History  Diagnosis Date  . Hypertension   . Dysuria   . UTI (urinary tract infection)   . Tuberculosis     h/o PPD +  . BPH (benign prostatic hyperplasia)   . Anemia     normal Fe, nl B12, nl retic, nl EPO July '13  . Chronic kidney disease     CKD III, obstructive nephropathy  . Elevated PSA, greater than or equal to 20 ng/ml June '13    PSA 107  . Chronic back pain   . Hyperlipidemia 05/29/2014    Past Surgical History  Procedure Laterality Date  . Spleenectomy      There were no vitals taken for this visit.  Visit Diagnosis:  Abnormality of gait  Bilateral low back pain, with sciatica presence unspecified  Decreased activity tolerance      Subjective Assessment - 06/12/14 1022    Symptoms Pain is constant in back but is worse with walking.  Reports HEP is going ok, he is working on it at home.   Currently in Pain? Yes   Pain Score --  "not too bad"   Aggravating Factors  walking and cold weather     Gait assessment x200' with mild impairment of foot clearance with ambulation. Otherwise normal gait pattern. Pt reporting bilateral buttock pain and right knee pain while walking.  Piriformis stretch 3x30 seconds each leg with therapist providing overpressure  Gluteus maximus stretch 3x30 seconds each leg  Manual LLE nerve adhesion technique x5 minutes  Supine quad/hip flexor stretch LLE 3x30  seconds  Supine with BLE propped: 30x ankle pumps and 30 x bilateral long arc quads                       PT Education - 06/12/14 1211    Education provided Yes   Education Details HEP   Person(s) Educated Patient   Methods Explanation   Comprehension Verbalized understanding;Returned demonstration             PT Long Term Goals - 05/29/14 1724    PT LONG TERM GOAL #1   Title Pt will verbalize understanding of fall prevention in the home environment. (Target 06/28/14)   Time 4   Period Weeks   Status New   PT LONG TERM GOAL #2   Title Pt will verbalize/demonstrate understanding of body mechanics/posture with ADLs to address pain.   Time 4   Period Weeks   Status New   PT LONG TERM GOAL #3   Title Pt will be independent with HEP for improved strength, balance, gait.   Time 4   Period Weeks   Status New   PT LONG TERM GOAL #4   Title improve Timed Up and Go score to less than or equal to 13.5 seconds for decreased fall risk.   Time 4  Period Weeks   Status New   PT LONG TERM GOAL #5   Title improve Dynamic Gait index score to at least 17/24 for decreased fall risk.   Time 4   Period Weeks   Status New   Additional Long Term Goals   Additional Long Term Goals Yes   PT LONG TERM GOAL #6   Title Pt will improve Activities of Balance Confidence Scale by at least 10 points for improved balance and confidence with functional activities.   Time 4   Period Weeks   Status New               Plan - 06/12/14 1212    Clinical Impression Statement Pt's pain appears to be multifactorial, may have cutaneous nerve adhesion especially in the RLE, has very very tight gluts, tight hamstrings worse on RLE, and tight quads bilaterally. In addition he has lower extremity swelling which limits movement of the legs and may be further aggrevating pain. Continue per plan of care to decrease pain and maximize funcition.        Problem List Patient Active  Problem List   Diagnosis Date Noted  . Hemorrhoid 05/29/2014  . 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 Hyperlipidemia 05/29/2014  . Lumbar radiculopathy 05/23/2014  . Lumbar stenosis 11/20/2013  . Abnormal glucose 11/06/2013  . Unspecified hereditary and idiopathic peripheral neuropathy 01/22/2013  . 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 Chronic renal insufficiency, stage II (mild) 11/24/2011  . Hematuria 11/24/2011  . Hydronephrosis 11/24/2011  . Anemia 11/24/2011  . BRADYCARDIA 02/05/2010  . TINEA PEDIS 01/29/2010  . 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 PERIPHERAL EDEMA 01/29/2010  . ABNORMAL ELECTROCARDIOGRAM 01/29/2010  . 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  . Osteoarthrosis, generalized, 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  . ONYCHOMYCOSIS, TOENAILS 10/02/2007  . BENIGN PROSTATIC HYPERTROPHY 10/02/2007  . Essential hypertension 03/06/2007    Delrae Sawyers D 06/12/2014, 12:22 PM  [facility] [address] [place], [place], [address] Phone: [phone]   Fax:  [phone]

## 2014-06-14 ENCOUNTER — Ambulatory Visit: Payer: Medicare Other

## 2014-06-14 DIAGNOSIS — M545 Low back pain: Secondary | ICD-10-CM

## 2014-06-14 DIAGNOSIS — R269 Unspecified abnormalities of gait and mobility: Secondary | ICD-10-CM

## 2014-06-14 DIAGNOSIS — R6889 Other general symptoms and signs: Secondary | ICD-10-CM

## 2014-06-14 NOTE — Therapy (Signed)
[facility] [address] [place], [place], [address] Phone: [phone]   Fax:  [phone]  Physical Therapy Treatment  Patient Details  Name: [name] MRN: [id number] Date of Birth: 06/24/1949 Referring Provider:  Biagio Borg, MD  Encounter Date: 06/14/2014      PT End of Session - 06/14/14 1232    Visit Number 5   Number of Visits 9   Date for PT Re-Evaluation 07/29/14   Authorization Type Medicare requires G-code every 10th visit   PT Start Time 1147   PT Stop Time 1227   PT Time Calculation (min) 40 min   Equipment Utilized During Treatment Gait belt   Activity Tolerance Patient tolerated treatment well   Behavior During Therapy [facility] for tasks assessed/performed      Past Medical History  Diagnosis Date  . Hypertension   . Dysuria   . UTI (urinary tract infection)   . Tuberculosis     h/o PPD +  . BPH (benign prostatic hyperplasia)   . Anemia     normal Fe, nl B12, nl retic, nl EPO July '13  . Chronic kidney disease     CKD III, obstructive nephropathy  . Elevated PSA, greater than or equal to 20 ng/ml June '13    PSA 107  . Chronic back pain   . Hyperlipidemia 05/29/2014    Past Surgical History  Procedure Laterality Date  . Spleenectomy      There were no vitals taken for this visit.  Visit Diagnosis:  Abnormality of gait  Bilateral low back pain, with sciatica presence unspecified  Decreased activity tolerance      Subjective Assessment - 06/14/14 1150    Symptoms Pt denied falls or changes since last visit. Pt reported he was very sore after last visit.    Patient Stated Goals Pt's goal for therapy is to improve leg and back pain.   Currently in Pain? Yes   Pain Score 8    Pain Location Back   Pain Orientation Lower;Right   Pain Descriptors / Indicators Aching   Pain Type Chronic pain   Pain Onset More than a month ago   Pain Frequency Constant   Aggravating Factors   sitting down for long periods of time       Therex: -Standing hip abduction BLE 2x10 reps. VC's for technique and to keep feet in neutral vs. ER. Pt had difficulty performing without lateral trunk flexion. -Sidelying clamshells B LE x10. VC's and tactile cue to keep hips forward and for technique. Pt had difficulty performing in full ROM due to weakness. -Hooklying hip abduction without band x10, with red theraband x5 (pt reported "it was too easy), 3x10 with blue theraband. VC's for technique.   Pt reported difficulty bending at knees due to weak legs, when PT instructed pt on correct body mechanics for lifting to decrease back pain. -Mini wall squats x12. VC's and demonstration for technique.  Pt required rest beaks during session due to pain, fatigue, and "dry mouth".          [facility] Adult PT Treatment/Exercise - 06/14/14 1152    Balance   Balance Assessed Yes   Dynamic Standing Balance   Dynamic Standing - Balance Support Right upper extremity supported   Dynamic Standing - Level of Assistance 5: Stand by assistance;Other (comment)  min guard   Dynamic Standing - Balance Activities Other (comment)   Dynamic Standing - Comments Balance activities at counter with 0-1  UE support: foward ambulation with head turns, marching, backwards ambulation and sidestepping. 4x7'/activity. VC's to improve heel strike, stride lenghth, and to decrease lateral trunk flexion.                PT Education - 06/14/14 1231    Education provided Yes   Education Details PT instructed pt on proper body mechanics for lifting and to not "twist" spine while performing ADLs, as pt reported increased pain yesterday. PT encouraged pt to perform TrA contraction while lifting and during transfer to support low back in order to decrease pain, and on the importance of upright posture.   Person(s) Educated Patient   Methods Explanation   Comprehension Verbalized understanding             PT Long  Term Goals - 06/14/14 1241    PT LONG TERM GOAL #1   Title Pt will verbalize understanding of fall prevention in the home environment. (Target 06/28/14)   Time 4   Period Weeks   Status On-going   PT LONG TERM GOAL #2   Title Pt will verbalize/demonstrate understanding of body mechanics/posture with ADLs to address pain.   Time 4   Period Weeks   Status On-going   PT LONG TERM GOAL #3   Title Pt will be independent with HEP for improved strength, balance, gait.   Time 4   Period Weeks   Status On-going   PT LONG TERM GOAL #4   Title improve Timed Up and Go score to less than or equal to 13.5 seconds for decreased fall risk.   Time 4   Period Weeks   Status On-going   PT LONG TERM GOAL #5   Title improve Dynamic Gait index score to at least 17/24 for decreased fall risk.   Time 4   Period Weeks   Status On-going   PT LONG TERM GOAL #6   Title Pt will improve Activities of Balance Confidence Scale by at least 10 points for improved balance and confidence with functional activities.   Time 4   Period Weeks   Status On-going               Plan - 06/14/14 1232    Clinical Impression Statement Pt demonstrated progress as he tolerated dynamic standing balance activites and resistance strengthening exercises. Pt continues to require rest breaks due to back pain, and requires redirection as he occasionally perservates on back pain even though he reported it was decreased today. Continue with POC to decrease pain and improve safety during functional mobility.   Pt will benefit from skilled therapeutic intervention in order to improve on the following deficits Abnormal gait;Decreased balance;Decreased mobility;Decreased strength;Difficulty walking;Pain   Rehab Potential Good   Clinical Impairments Affecting Rehab Potential longstanding back and leg pain (due to kidney disease per pt report)   PT Frequency 2x / week   PT Duration 4 weeks   PT Treatment/Interventions ADLs/Self Care  Home Management;Stair training;Gait training;Balance training;Neuromuscular re-education;Therapeutic exercise;Therapeutic activities;Patient/family education   PT Next Visit Plan Add hooklying hip abduction with blue theraband and mini wall squats if pt reports no increase in pain after last session. Continue dynamic balance/gait as tolerated.   PT Home Exercise Plan lower extremity strengthening and balance   Consulted and Agree with Plan of Care Patient        Problem List Patient Active Problem List   Diagnosis Date Noted  . Hemorrhoid 05/29/2014  . 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 Hyperlipidemia 05/29/2014  .  Lumbar radiculopathy 05/23/2014  . Lumbar stenosis 11/20/2013  . Abnormal glucose 11/06/2013  . Unspecified hereditary and idiopathic peripheral neuropathy 01/22/2013  . 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 Chronic renal insufficiency, stage II (mild) 11/24/2011  . Hematuria 11/24/2011  . Hydronephrosis 11/24/2011  . Anemia 11/24/2011  . BRADYCARDIA 02/05/2010  . TINEA PEDIS 01/29/2010  . 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 PERIPHERAL EDEMA 01/29/2010  . ABNORMAL ELECTROCARDIOGRAM 01/29/2010  . 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  . Osteoarthrosis, generalized, 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  . ONYCHOMYCOSIS, TOENAILS 10/02/2007  . BENIGN PROSTATIC HYPERTROPHY 10/02/2007  . Essential hypertension 03/06/2007    Miller,Jennifer L 06/14/2014, 12:42 PM  [facility] [address] [place], [place], [address] Phone: [phone]   Fax:  [phone]    Geoffry Paradise, PT,DPT 06/14/2014 12:42 PM Phone: [phone] Fax: [phone]

## 2014-06-17 DIAGNOSIS — R339 Retention of urine, unspecified: Secondary | ICD-10-CM | POA: Diagnosis not present

## 2014-06-19 ENCOUNTER — Ambulatory Visit: Payer: Medicare Other | Admitting: Physical Therapy

## 2014-06-19 DIAGNOSIS — R6889 Other general symptoms and signs: Secondary | ICD-10-CM | POA: Diagnosis not present

## 2014-06-19 DIAGNOSIS — R269 Unspecified abnormalities of gait and mobility: Secondary | ICD-10-CM | POA: Diagnosis not present

## 2014-06-19 DIAGNOSIS — M545 Low back pain: Secondary | ICD-10-CM | POA: Diagnosis not present

## 2014-06-19 NOTE — Therapy (Signed)
[facility] [address] [place], [place], [address] Phone: [phone]   Fax:  [phone]  Physical Therapy Treatment  Patient Details  Name: [name] MRN: [id number] Date of Birth: 06/24/1949 Referring Provider:  Biagio Borg, MD  Encounter Date: 06/19/2014      PT End of Session - 06/20/14 1417    Visit Number 6   Number of Visits 9   Date for PT Re-Evaluation 07/29/14   Authorization Type Medicare requires G-code every 10th visit   PT Start Time 1105   PT Stop Time 1145   PT Time Calculation (min) 40 min   Activity Tolerance Patient limited by pain  takes several rest breaks due to pain      Past Medical History  Diagnosis Date  . Hypertension   . Dysuria   . UTI (urinary tract infection)   . Tuberculosis     h/o PPD +  . BPH (benign prostatic hyperplasia)   . Anemia     normal Fe, nl B12, nl retic, nl EPO July '13  . Chronic kidney disease     CKD III, obstructive nephropathy  . Elevated PSA, greater than or equal to 20 ng/ml June '13    PSA 107  . Chronic back pain   . Hyperlipidemia 05/29/2014    Past Surgical History  Procedure Laterality Date  . Spleenectomy      There were no vitals taken for this visit.  Visit Diagnosis:  Abnormality of gait  Bilateral low back pain, with sciatica presence unspecified  Decreased activity tolerance      Subjective Assessment - 06/19/14 1107    Symptoms Pt reports some increase in pain resulting from last session.  No falls   Currently in Pain? Yes   Pain Score 7   7-8/10   Pain Location Abdomen  and R knee   Pain Orientation Right;Lower   Pain Descriptors / Indicators Aching   Pain Type Chronic pain   Pain Onset More than a month ago   Pain Frequency Constant   Aggravating Factors  walking long distances aggravates   Pain Relieving Factors Medications     TherEx:  Hooklying hip abduction, 10 reps x 3 sets with blue theraband.  Bridging x 10 reps, 2 sets for hip strengthening.  With bridging, pt c/o increased back and RLE pain.  Trunk rotation 10 reps each side, in supine.  Standing mini squats 2 sets x 10 reps, with cues for abdominal activation, glut and quads activation upon return to standing.  Pt c/o increased pain in R knee with mini squats.  Provided patient with blue theraband to use with hooklying abduction at home.  Neuro Reeducation:  Balance activities at counter-sidestepping, forward/backward walking 2 sets x 12 ft with intermittent UE support, with cues provided for abdominal activation to maintain upright trunk with standing exercises.  Marching in place x 10 reps with intermittent UE support.  Standing with feet apart on solid surface with head turns, then head nods x 10 reps each for improved balance.  Pt requires several seated rest breaks due to knee pain.  Self Care:  Discussed with patient continued knee and back pain, in relation to PT plan of care>PT attempting to address pain through stretching and strengthening; however, pt continues to remain fairly constant.  Discussed knee pain and relation to back pain:  Increased R knee valgus is contributing to decreased R knee stability, which is contributing to lateral trunk lean with  gait, which may be contributing to pain.  Encouraged patient to discuss ongoing R knee pain and valgus knee positioning with physician as means of discussing future management to prevent knee instability and potential for falls.                       PT Education - 06/20/14 1416    Education provided Yes   Education Details knee pain as it relates to back pain, knee instability>recommended pt speak with physician   Person(s) Educated Patient   Methods Explanation   Comprehension Verbalized understanding    Provided verbal instruction for hooklying abduction with blue theraband, 10 reps x 2-3 sets each day.         PT Long Term Goals - 06/14/14 1241    PT  LONG TERM GOAL #1   Title Pt will verbalize understanding of fall prevention in the home environment. (Target 06/28/14)   Time 4   Period Weeks   Status On-going   PT LONG TERM GOAL #2   Title Pt will verbalize/demonstrate understanding of body mechanics/posture with ADLs to address pain.   Time 4   Period Weeks   Status On-going   PT LONG TERM GOAL #3   Title Pt will be independent with HEP for improved strength, balance, gait.   Time 4   Period Weeks   Status On-going   PT LONG TERM GOAL #4   Title improve Timed Up and Go score to less than or equal to 13.5 seconds for decreased fall risk.   Time 4   Period Weeks   Status On-going   PT LONG TERM GOAL #5   Title improve Dynamic Gait index score to at least 17/24 for decreased fall risk.   Time 4   Period Weeks   Status On-going   PT LONG TERM GOAL #6   Title Pt will improve Activities of Balance Confidence Scale by at least 10 points for improved balance and confidence with functional activities.   Time 4   Period Weeks   Status On-going               Plan - 06/20/14 1417    Clinical Impression Statement Pt reports no significant increase at end of today's session, but pain appears to limit participation and progression of standing activities.  Pt would benefit from speaking with physician regarding ongoing plans to manage knee pain.  Pt will continue to benefit from further skilled PT to address strength, balance and gait.   Pt will benefit from skilled therapeutic intervention in order to improve on the following deficits Abnormal gait;Decreased balance;Decreased mobility;Decreased strength;Difficulty walking;Pain   Rehab Potential Good   Clinical Impairments Affecting Rehab Potential longstanding back and leg pain (due to kidney disease per pt report)   PT Frequency 2x / week   PT Duration 4 weeks  wk 3 of 4   PT Treatment/Interventions ADLs/Self Care Home Management;Stair training;Gait training;Balance  training;Neuromuscular re-education;Therapeutic exercise;Therapeutic activities;Patient/family education   PT Next Visit Plan Balance, gait activities; review HEP        Problem List Patient Active Problem List   Diagnosis Date Noted  . Hemorrhoid 05/29/2014  . 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 Hyperlipidemia 05/29/2014  . Lumbar radiculopathy 05/23/2014  . Lumbar stenosis 11/20/2013  . Abnormal glucose 11/06/2013  . Unspecified hereditary and idiopathic peripheral neuropathy 01/22/2013  . 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 Chronic renal insufficiency, stage II (mild) 11/24/2011  . Hematuria 11/24/2011  .  Hydronephrosis 11/24/2011  . Anemia 11/24/2011  . BRADYCARDIA 02/05/2010  . TINEA PEDIS 01/29/2010  . 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 PERIPHERAL EDEMA 01/29/2010  . ABNORMAL ELECTROCARDIOGRAM 01/29/2010  . 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  . Osteoarthrosis, generalized, 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  . ONYCHOMYCOSIS, TOENAILS 10/02/2007  . BENIGN PROSTATIC HYPERTROPHY 10/02/2007  . Essential hypertension 03/06/2007    Frazier Butt. 06/20/2014, 2:21 PM  [facility] [address] [place], [place], [address] Phone: [phone]   Fax:  [phone]

## 2014-06-21 ENCOUNTER — Emergency Department (HOSPITAL_COMMUNITY)
Admission: EM | Admit: 2014-06-21 | Discharge: 2014-06-21 | Disposition: A | Discharge status: Home / Self Care | Payer: Medicare Other | Attending: Emergency Medicine | Admitting: Emergency Medicine

## 2014-06-21 ENCOUNTER — Ambulatory Visit: Payer: Medicare Other | Admitting: Physical Therapy

## 2014-06-21 ENCOUNTER — Encounter (HOSPITAL_COMMUNITY): Payer: Self-pay

## 2014-06-21 ENCOUNTER — Emergency Department (HOSPITAL_COMMUNITY): Payer: Medicare Other

## 2014-06-21 DIAGNOSIS — Z87440 Personal history of urinary (tract) infections: Secondary | ICD-10-CM | POA: Insufficient documentation

## 2014-06-21 DIAGNOSIS — N183 Chronic kidney disease, stage 3 (moderate): Secondary | ICD-10-CM | POA: Insufficient documentation

## 2014-06-21 DIAGNOSIS — M79672 Pain in left foot: Secondary | ICD-10-CM | POA: Diagnosis not present

## 2014-06-21 DIAGNOSIS — Z7982 Long term (current) use of aspirin: Secondary | ICD-10-CM | POA: Insufficient documentation

## 2014-06-21 DIAGNOSIS — I129 Hypertensive chronic kidney disease with stage 1 through stage 4 chronic kidney disease, or unspecified chronic kidney disease: Secondary | ICD-10-CM | POA: Diagnosis not present

## 2014-06-21 DIAGNOSIS — Z8611 Personal history of tuberculosis: Secondary | ICD-10-CM | POA: Insufficient documentation

## 2014-06-21 DIAGNOSIS — Z79899 Other long term (current) drug therapy: Secondary | ICD-10-CM | POA: Diagnosis not present

## 2014-06-21 DIAGNOSIS — Z862 Personal history of diseases of the blood and blood-forming organs and certain disorders involving the immune mechanism: Secondary | ICD-10-CM | POA: Insufficient documentation

## 2014-06-21 DIAGNOSIS — R52 Pain, unspecified: Secondary | ICD-10-CM

## 2014-06-21 DIAGNOSIS — E785 Hyperlipidemia, unspecified: Secondary | ICD-10-CM | POA: Diagnosis not present

## 2014-06-21 DIAGNOSIS — Z87891 Personal history of nicotine dependence: Secondary | ICD-10-CM | POA: Diagnosis not present

## 2014-06-21 DIAGNOSIS — G8929 Other chronic pain: Secondary | ICD-10-CM | POA: Insufficient documentation

## 2014-06-21 DIAGNOSIS — Z87438 Personal history of other diseases of male genital organs: Secondary | ICD-10-CM | POA: Insufficient documentation

## 2014-06-21 MED ORDER — HYDROCODONE-ACETAMINOPHEN 5-325 MG PO TABS: 1.0000 | ORAL_TABLET | ORAL | Status: DC | PRN

## 2014-06-21 MED ORDER — HYDROCODONE-ACETAMINOPHEN 5-325 MG PO TABS
2.0000 | ORAL_TABLET | Freq: Once | ORAL | Status: AC
  Administered 2014-06-21: 2 via ORAL
  Filled 2014-06-21: qty 2

## 2014-06-21 NOTE — Discharge Instructions (Signed)
If you were given medicines take as directed.  If you are on coumadin or contraceptives realize their levels and effectiveness is altered by many different medicines.  If you have any reaction (rash, tongues swelling, other) to the medicines stop taking and see a physician.   For severe pain take norco or vicodin however realize they have the potential for addiction and it can make you sleepy and has tylenol in it.  No operating machinery while taking.  Please follow up as directed and return to the ER or see a physician for new or worsening symptoms.  Thank you. Filed Vitals:   06/21/14 0919 06/21/14 0923  BP:  131/104  Pulse: 74   Temp: 97.9 F (36.6 C)   TempSrc: Oral   Resp: 18   SpO2: 98%

## 2014-06-21 NOTE — ED Notes (Signed)
Pain starting in left foot yesterday. Difficult to walk

## 2014-06-21 NOTE — ED Provider Notes (Signed)
CSN: [id number]     Arrival date & time 06/21/14  3664 History   First MD Initiated Contact with Patient 06/21/14 [phone]     Chief Complaint  Patient presents with  . 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 Patient denies known history of joint issues, denies gout history. Patient has osteoarthritis history. No injury recalled, pain with walking and palpation. No fevers or chills. No cold-like sensation. Mild swelling lateral left foot.  Patient is a 64 y.o. male presenting with lower extremity pain. The history is provided by the patient.  Foot Pain Pertinent negatives include no chest pain, no abdominal pain, no headaches and no shortness of breath.    Past Medical History  Diagnosis Date  . Hypertension   . Dysuria   . UTI (urinary tract infection)   . Tuberculosis     h/o PPD +  . BPH (benign prostatic hyperplasia)   . Anemia     normal Fe, nl B12, nl retic, nl EPO July '13  . Chronic kidney disease     CKD III, obstructive nephropathy  . Elevated PSA, greater than or equal to 20 ng/ml June '13    PSA 107  . Chronic back pain   . Hyperlipidemia 05/29/2014   Past Surgical History  Procedure Laterality Date  . Spleenectomy     Family History  Problem Relation Age of Onset  . Diabetes Mother   . Stroke Brother   . Hearing loss Brother    History  Substance Use Topics  . Smoking status: Former Smoker    Types: Cigarettes  . Smokeless tobacco: Never Used     Comment: Quit 1981  . Alcohol Use: No     Comment: Quit 2004    Review of Systems  Constitutional: Negative for fever and chills.  HENT: Negative for congestion.   Eyes: Negative for visual disturbance.  Respiratory: Negative  for shortness of breath.   Cardiovascular: Negative for chest pain.  Gastrointestinal: Negative for vomiting and abdominal pain.  Genitourinary: Negative for dysuria and flank pain.  Musculoskeletal: Positive for joint swelling and gait problem. Negative for back pain, neck pain and neck stiffness.  Skin: Negative for rash.  Neurological: Negative for weakness, light-headedness, numbness and headaches.      Allergies  Review of patient's allergies indicates no known allergies.  Home Medications   Prior to Admission medications   Medication Sig Start Date End Date Taking? Authorizing Provider  amLODipine (NORVASC) 5 MG tablet Take 1 tablet (5 mg total) by mouth daily. 05/10/14   Biagio Borg, MD  aspirin 81 MG tablet Take 81 mg by mouth daily.    Historical Provider, MD  atorvastatin (LIPITOR) 20 MG tablet Take 1 tablet (20 mg total) by mouth daily. 05/29/14 05/29/15  Biagio Borg, MD  finasteride (PROSCAR) 5 MG tablet Take 1 tablet (5 mg total) by mouth daily. 01/18/14   Biagio Borg, MD  gabapentin (NEURONTIN) 300 MG capsule Take 1 capsule (300 mg total) by mouth 3 (three) times daily. 05/23/14   Marcial Pacas, MD  HYDROcodone-acetaminophen (NORCO) 5-325 MG per tablet Take 1-2 tablets by mouth every 4 (four) hours as needed. 06/21/14   Mariea Clonts, MD  labetalol (NORMODYNE) 200 MG tablet Take 1 tablet (200 mg total) by  mouth every morning. 05/10/14   Biagio Borg, MD  lidocaine (XYLOCAINE) 5 % ointment Apply 1 application topically as needed.    Historical Provider, MD  oxybutynin (DITROPAN) 5 MG tablet Take 1 tablet (5 mg total) by mouth daily. 05/29/14   Biagio Borg, MD   BP 131/104 mmHg  Pulse 74  Temp(Src) 97.9 F (36.6 C) (Oral)  Resp 18  SpO2 98% Physical Exam  Constitutional: He is oriented to person, place, and time. He appears well-developed and well-nourished.  HENT:  Head: Normocephalic and atraumatic.  Eyes: Conjunctivae are normal. Right eye exhibits no discharge.  Left eye exhibits no discharge.  Neck: Normal range of motion. Neck supple. No tracheal deviation present.  Cardiovascular: Normal rate and regular rhythm.   Pulmonary/Chest: Effort normal and breath sounds normal.  Abdominal: Soft. He exhibits no distension. There is no tenderness. There is no guarding.  Musculoskeletal: He exhibits edema and tenderness.  Patient with mild/moderate tenderness distal lateral left foot, no signs of infection, no crepitus, full range of motion, very mild swelling. Neurovascular intact left lower extremity. Good cap refill  Neurological: He is alert and oriented to person, place, and time.  Skin: Skin is warm. No rash noted.  Psychiatric: He has a normal mood and affect.  Nursing note and vitals reviewed.   ED Course  Procedures (including critical care time) Labs Review Labs Reviewed - No data to display  Imaging Review Dg Foot Complete Left  06/21/2014   CLINICAL DATA:  Left lateral foot pain with no known trauma  EXAM: LEFT FOOT - COMPLETE 3+ VIEW  COMPARISON:  None.  FINDINGS: No fracture or dislocation of mid foot or forefoot. The phalanges are normal. The calcaneus is normal. No soft tissue abnormality. Bony spurring of the calcaneus. Accessory os perineum noted adjacent to the fifth metatarsal  IMPRESSION: No acute osseous abnormality.   Electronically Signed   By: Suzy Bouchard M.D.   On: 06/21/2014 09:53     EKG Interpretation None      MDM   Final diagnoses:  Left foot pain   Patient with clinically musculoskeletal pain for 1 day. Plan for screening x-ray, very low suspicion for infection at this time however stressed very close follow-up to monitor symptoms. Discussed reasons to return, pain medicines given in the ER. No signs of acute ischemia.  Pain improved in the ER, x-ray reviewed no acute fracture. Results and differential diagnosis were discussed with the patient/parent/guardian. Close follow up outpatient was discussed,  comfortable with the plan.   Medications  HYDROcodone-acetaminophen (NORCO/VICODIN) 5-325 MG per tablet 2 tablet (2 tablets Oral Given 06/21/14 0923)    Filed Vitals:   06/21/14 0919 06/21/14 0923  BP:  131/104  Pulse: 74   Temp: 97.9 F (36.6 C)   TempSrc: Oral   Resp: 18   SpO2: 98%     Final diagnoses:  Left foot pain       Mariea Clonts, MD 06/21/14 1000

## 2014-06-26 ENCOUNTER — Ambulatory Visit: Payer: Medicare Other | Admitting: Physical Therapy

## 2014-06-26 DIAGNOSIS — M545 Low back pain: Secondary | ICD-10-CM

## 2014-06-26 DIAGNOSIS — R269 Unspecified abnormalities of gait and mobility: Secondary | ICD-10-CM

## 2014-06-26 DIAGNOSIS — R6889 Other general symptoms and signs: Secondary | ICD-10-CM

## 2014-06-28 ENCOUNTER — Ambulatory Visit: Payer: Medicare Other | Admitting: Physical Therapy

## 2014-07-02 NOTE — Therapy (Signed)
[facility] [address] [place], [place], [address] Phone: [phone]   Fax:  [phone]  Physical Therapy Treatment  Patient Details  Name: [name] MRN: [id number] Date of Birth: 06/24/1949 Referring Provider:  Biagio Borg, MD  Encounter Date: 06/26/2014      PT End of Session - 07/02/14 1332    Visit Number 7   Number of Visits 9   Date for PT Re-Evaluation 07/29/14   Authorization Type Medicare requires G-code every 10th visit   PT Start Time 1117  Pt arrives late for session   PT Stop Time 1148   PT Time Calculation (min) 31 min   Activity Tolerance Patient limited by pain      Past Medical History  Diagnosis Date  . Hypertension   . Dysuria   . UTI (urinary tract infection)   . Tuberculosis     h/o PPD +  . BPH (benign prostatic hyperplasia)   . Anemia     normal Fe, nl B12, nl retic, nl EPO July '13  . Chronic kidney disease     CKD III, obstructive nephropathy  . Elevated PSA, greater than or equal to 20 ng/ml June '13    PSA 107  . Chronic back pain   . Hyperlipidemia 05/29/2014    Past Surgical History  Procedure Laterality Date  . Spleenectomy      There were no vitals taken for this visit.  Visit Diagnosis:  Abnormality of gait  Bilateral low back pain, with sciatica presence unspecified  Decreased activity tolerance        [facility] PT Assessment - 07/02/14 1338    Ambulation/Gait   Ambulation/Gait Yes   Gait velocity 12.02 sec =    Dynamic Gait Index   Level Surface Mild Impairment   Change in Gait Speed Mild Impairment   Gait with Horizontal Head Turns Mild Impairment   Gait with Vertical Head Turns Mild Impairment   Gait and Pivot Turn Normal   Step Over Obstacle Moderate Impairment   Step Around Obstacles Mild Impairment   Steps Moderate Impairment   Total Score 15   Timed Up and Go Test   TUG Normal TUG   Normal TUG (seconds) 18.35  16.84 seconds with cane                                PT Long Term Goals - 07/02/14 1339    PT LONG TERM GOAL #1   Title Pt will verbalize understanding of fall prevention in the home environment. ([employer] 06/28/14)   Status On-going   PT LONG TERM GOAL #2   Title Pt will verbalize/demonstrate understanding of body mechanics/posture with ADLs to address pain.   PT LONG TERM GOAL #3   Title Pt will be independent with HEP for improved strength, balance, gait.   Status On-going   PT LONG TERM GOAL #4   Title improve Timed Up and Go score to less than or equal to 13.5 seconds for decreased fall risk.   Status Not Met  18.35 sec   PT LONG TERM GOAL #5   Title improve Dynamic Gait index score to at least 17/24 for decreased fall risk.   Status Not Met  DGI 15/24, improved from 12/24               Plan - 07/02/14 1333    Clinical Impression Statement Pt has  not met LTG #4 or #5.  Discussed possibility of checking leg length difference/trying heel wedge next visit to help offset strong lateral lean towards R side.  Discussed plans to discharge next visits to  allow patient to follow up with orthopedic physician due to knee pain preventing progression of therapy.   Pt will benefit from skilled therapeutic intervention in order to improve on the following deficits Abnormal gait;Decreased balance;Decreased mobility;Decreased strength;Difficulty walking;Pain   Rehab Potential Good   Clinical Impairments Affecting Rehab Potential longstanding back and leg pain (due to kidney disease per pt report)   PT Frequency 2x / week   PT Duration 4 weeks  wk 4 of 4   PT Next Visit Plan Check remaining goals and plan for discharge   Consulted and Agree with Plan of Care Patient        Problem List Patient Active Problem List   Diagnosis Date Noted  . Hemorrhoid 05/29/2014  . 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 Hyperlipidemia 05/29/2014  . Lumbar radiculopathy 05/23/2014  . Lumbar stenosis 11/20/2013   . Abnormal glucose 11/06/2013  . Unspecified hereditary and idiopathic peripheral neuropathy 01/22/2013  . 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 Chronic renal insufficiency, stage II (mild) 11/24/2011  . Hematuria 11/24/2011  . Hydronephrosis 11/24/2011  . Anemia 11/24/2011  . BRADYCARDIA 02/05/2010  . TINEA PEDIS 01/29/2010  . 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 PERIPHERAL EDEMA 01/29/2010  . ABNORMAL ELECTROCARDIOGRAM 01/29/2010  . 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  . Osteoarthrosis, generalized, 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  . ONYCHOMYCOSIS, TOENAILS 10/02/2007  . BENIGN PROSTATIC HYPERTROPHY 10/02/2007  . Essential hypertension 03/06/2007    Fox Salminen W. 07/02/2014, 1:41 PM  For treatment performed on 06/26/14 Mady Haagensen, PT 07/02/2014 1:42 PM Phone: [phone] Fax: [facility] [address] [place], [place], [address] Phone: [phone]   Fax:  [phone]

## 2014-07-11 ENCOUNTER — Encounter (HOSPITAL_COMMUNITY): Payer: Self-pay | Admitting: Emergency Medicine

## 2014-07-11 ENCOUNTER — Emergency Department (HOSPITAL_COMMUNITY)
Admission: EM | Admit: 2014-07-11 | Discharge: 2014-07-11 | Disposition: A | Discharge status: Home / Self Care | Payer: Medicare Other | Attending: Emergency Medicine | Admitting: Emergency Medicine

## 2014-07-11 DIAGNOSIS — Z79899 Other long term (current) drug therapy: Secondary | ICD-10-CM | POA: Insufficient documentation

## 2014-07-11 DIAGNOSIS — N390 Urinary tract infection, site not specified: Secondary | ICD-10-CM | POA: Insufficient documentation

## 2014-07-11 DIAGNOSIS — Z87891 Personal history of nicotine dependence: Secondary | ICD-10-CM | POA: Diagnosis not present

## 2014-07-11 DIAGNOSIS — N183 Chronic kidney disease, stage 3 (moderate): Secondary | ICD-10-CM | POA: Diagnosis not present

## 2014-07-11 DIAGNOSIS — R339 Retention of urine, unspecified: Secondary | ICD-10-CM

## 2014-07-11 DIAGNOSIS — Z8611 Personal history of tuberculosis: Secondary | ICD-10-CM | POA: Insufficient documentation

## 2014-07-11 DIAGNOSIS — G8929 Other chronic pain: Secondary | ICD-10-CM | POA: Diagnosis not present

## 2014-07-11 DIAGNOSIS — Z7982 Long term (current) use of aspirin: Secondary | ICD-10-CM | POA: Diagnosis not present

## 2014-07-11 DIAGNOSIS — E785 Hyperlipidemia, unspecified: Secondary | ICD-10-CM | POA: Insufficient documentation

## 2014-07-11 DIAGNOSIS — T8351XA Infection and inflammatory reaction due to indwelling urinary catheter, initial encounter: Secondary | ICD-10-CM | POA: Diagnosis not present

## 2014-07-11 DIAGNOSIS — I129 Hypertensive chronic kidney disease with stage 1 through stage 4 chronic kidney disease, or unspecified chronic kidney disease: Secondary | ICD-10-CM | POA: Insufficient documentation

## 2014-07-11 DIAGNOSIS — T83511A Infection and inflammatory reaction due to indwelling urethral catheter, initial encounter: Secondary | ICD-10-CM

## 2014-07-11 DIAGNOSIS — B9689 Other specified bacterial agents as the cause of diseases classified elsewhere: Secondary | ICD-10-CM | POA: Diagnosis not present

## 2014-07-11 LAB — URINALYSIS, ROUTINE W REFLEX MICROSCOPIC
BILIRUBIN URINE: NEGATIVE
Glucose, UA: NEGATIVE mg/dL
KETONES UR: NEGATIVE mg/dL
NITRITE: NEGATIVE
Protein, ur: NEGATIVE mg/dL
Specific Gravity, Urine: 1.01 (ref 1.005–1.030)
UROBILINOGEN UA: 0.2 mg/dL (ref 0.0–1.0)
pH: 6 (ref 5.0–8.0)

## 2014-07-11 LAB — URINE MICROSCOPIC-ADD ON

## 2014-07-11 MED ORDER — CIPROFLOXACIN HCL 500 MG PO TABS
500.0000 mg | ORAL_TABLET | Freq: Once | ORAL | Status: AC
  Administered 2014-07-11: 500 mg via ORAL
  Filled 2014-07-11: qty 1

## 2014-07-11 MED ORDER — LIDOCAINE HCL 2 % EX GEL
CUTANEOUS | Status: AC
  Filled 2014-07-11: qty 10

## 2014-07-11 MED ORDER — CIPROFLOXACIN HCL 500 MG PO TABS: 500.0000 mg | ORAL_TABLET | Freq: Two times a day (BID) | ORAL | Status: DC

## 2014-07-11 NOTE — ED Notes (Signed)
Pt w/ hx of indwelling catheter.  Was supposed to see his urologist today to have his catheter removed however when he went to the office, they said his insurance was incorrect so they would not see him.  Pt decided to deflate the balloon and remove the catheter himself.  Pt removed the catheter at 1100 and has not been able to urinate since.

## 2014-07-11 NOTE — ED Provider Notes (Signed)
CSN: [id number]     Arrival date & time 07/11/14  1655 History   First MD Initiated Contact with Patient 07/11/14 1709     Chief Complaint  Patient presents with  . Urinary Retention     (Consider location/radiation/quality/duration/timing/severity/associated sxs/prior Treatment) HPI Comments: Patient with history of enlarged prostate, indwelling catheter use 3 years -- presents with urinary retention. Patient was due to have his catheter changed at his urologist office today. He was unable to be seen because of insurance problems. Patient removed the catheter at home at approximately 11 AM. He wanted to see if he was able to urinate. He was not. Patient presents with complaint of lower abdominal pain and urinary retention. No recent fever, nausea or vomiting. Patient has chronic back problems which are at baseline. The onset of this condition was acute. The course is constant. Aggravating factors: none. Alleviating factors: none.    The history is provided by the patient.    Past Medical History  Diagnosis Date  . Hypertension   . Dysuria   . UTI (urinary tract infection)   . Tuberculosis     h/o PPD +  . BPH (benign prostatic hyperplasia)   . Anemia     normal Fe, nl B12, nl retic, nl EPO July '13  . Chronic kidney disease     CKD III, obstructive nephropathy  . Elevated PSA, greater than or equal to 20 ng/ml June '13    PSA 107  . Chronic back pain   . Hyperlipidemia 05/29/2014   Past Surgical History  Procedure Laterality Date  . Spleenectomy     Family History  Problem Relation Age of Onset  . Diabetes Mother   . Stroke Brother   . Hearing loss Brother    History  Substance Use Topics  . Smoking status: Former Smoker    Types: Cigarettes  . Smokeless tobacco: Never Used     Comment: Quit 1981  . Alcohol Use: No     Comment: Quit 2004    Review of Systems  Constitutional: Negative for fever.  HENT: Negative for rhinorrhea and sore throat.   Eyes: Negative  for redness.  Respiratory: Negative for cough.   Cardiovascular: Negative for chest pain.  Gastrointestinal: Negative for nausea, vomiting, abdominal pain and diarrhea.  Genitourinary: Positive for decreased urine volume and difficulty urinating. Negative for dysuria, hematuria and penile pain.  Musculoskeletal: Negative for myalgias.  Skin: Negative for rash.  Neurological: Negative for headaches.      Allergies  Review of patient's allergies indicates no known allergies.  Home Medications   Prior to Admission medications   Medication Sig Start Date End Date Taking? Authorizing Provider  amLODipine (NORVASC) 5 MG tablet Take 1 tablet (5 mg total) by mouth daily. 05/10/14  Yes Biagio Borg, MD  aspirin 81 MG tablet Take 81 mg by mouth daily.   Yes Historical Provider, MD  atorvastatin (LIPITOR) 20 MG tablet Take 1 tablet (20 mg total) by mouth daily. 05/29/14 05/29/15 Yes Biagio Borg, MD  finasteride (PROSCAR) 5 MG tablet Take 1 tablet (5 mg total) by mouth daily. 01/18/14  Yes Biagio Borg, MD  gabapentin (NEURONTIN) 300 MG capsule Take 1 capsule (300 mg total) by mouth 3 (three) times daily. 05/23/14  Yes Marcial Pacas, MD  labetalol (NORMODYNE) 200 MG tablet Take 1 tablet (200 mg total) by mouth every morning. 05/10/14  Yes Biagio Borg, MD  lidocaine (XYLOCAINE) 5 % ointment Apply 1 application topically  as needed for mild pain.    Yes Historical Provider, MD  oxybutynin (DITROPAN) 5 MG tablet Take 1 tablet (5 mg total) by mouth daily. 05/29/14  Yes Biagio Borg, MD  HYDROcodone-acetaminophen (NORCO) 5-325 MG per tablet Take 1-2 tablets by mouth every 4 (four) hours as needed. Patient not taking: Reported on 07/11/2014 06/21/14   Mariea Clonts, MD   BP 140/99 mmHg  Pulse 78  Temp(Src) 98.7 F (37.1 C) (Oral)  Resp 18  SpO2 100%   Physical Exam  Constitutional: He appears well-developed and well-nourished.  HENT:  Head: Normocephalic and atraumatic.  Eyes: Conjunctivae are  normal. Right eye exhibits no discharge. Left eye exhibits no discharge.  Neck: Normal range of motion. Neck supple.  Cardiovascular: Normal rate, regular rhythm and normal heart sounds.   Pulmonary/Chest: Effort normal and breath sounds normal.  Abdominal: Soft. Bowel sounds are normal. There is tenderness (Mild) in the suprapubic area. There is no CVA tenderness.  Neurological: He is alert.  Skin: Skin is warm and dry.  Psychiatric: He has a normal mood and affect.  Nursing note and vitals reviewed.   ED Course  Procedures (including critical care time) Labs Review Labs Reviewed  URINALYSIS, ROUTINE W REFLEX MICROSCOPIC - Abnormal; Notable for the following:    APPearance CLOUDY (*)    Hgb urine dipstick MODERATE (*)    Leukocytes, UA LARGE (*)    All other components within normal limits  URINE MICROSCOPIC-ADD ON - Abnormal; Notable for the following:    Bacteria, UA MANY (*)    All other components within normal limits  URINE CULTURE    Imaging Review No results found.   EKG Interpretation None      5:45 PM Patient seen and examined. Bladder scan > 200 and foley placed. Patient is feeling much better. Pending UA.   Vital signs reviewed and are as follows: BP 140/99 mmHg  Pulse 78  Temp(Src) 98.7 F (37.1 C) (Oral)  Resp 18  SpO2 100%   6:01 PM Discussed with Dr. Zenia Resides.   7:42 PM Discussed UA results with Dr. Zenia Resides. Patient continues to do well. Will treat with cipro. Will have patient follow-up.   Patient urged to return with worsening symptoms or other concerns. Patient verbalized understanding and agrees with plan.     MDM   Final diagnoses:  Urinary retention  Urinary tract infection associated with catheterization of urinary tract, initial encounter   Pt with history of urinary retention secondary to enlarged prostate. No symptoms of UTI, however urine taken from new catheter is grossly infected. Culture sent. Antibiotics initiated.    Carlisle Cater, PA-C 07/11/14 1943  Leota Jacobsen, MD 07/15/14 [phone]

## 2014-07-11 NOTE — Discharge Instructions (Signed)
Please read and follow all provided instructions.  Your diagnoses today include:  1. Urinary retention   2. Urinary tract infection associated with catheterization of urinary tract, initial encounter     Tests performed today include:  Urine test - suggests that you have an infection in your bladder  Vital signs. See below for your results today.   Medications prescribed:   Ciprofloxacin - antibiotic  You have been prescribed an antibiotic medicine: take the entire course of medicine even if you are feeling better. Stopping early can cause the antibiotic not to work.  Home care instructions:  Follow any educational materials contained in this packet.  Follow-up instructions: Please follow-up with your urologist in 7 days.  Return instructions:   Please return to the Emergency Department if you experience worsening symptoms.   Return with fever, worsening pain, persistent vomiting, worsening pain in your back.   Please return if you have any other emergent concerns.  Additional Information:  Your vital signs today were: BP 151/100 mmHg   Pulse 62   Temp(Src) 98.5 F (36.9 C) (Oral)   Resp 16   SpO2 100% If your blood pressure (BP) was elevated above 135/85 this visit, please have this repeated by your doctor within one month. --------------

## 2014-07-13 LAB — URINE CULTURE: Colony Count: >=100000

## 2014-07-16 ENCOUNTER — Emergency Department (HOSPITAL_COMMUNITY)
Admission: EM | Admit: 2014-07-16 | Discharge: 2014-07-16 | Disposition: A | Discharge status: Home / Self Care | Payer: Medicare Other | Attending: Emergency Medicine | Admitting: Emergency Medicine

## 2014-07-16 ENCOUNTER — Encounter (HOSPITAL_COMMUNITY): Payer: Self-pay

## 2014-07-16 DIAGNOSIS — Z79899 Other long term (current) drug therapy: Secondary | ICD-10-CM | POA: Diagnosis not present

## 2014-07-16 DIAGNOSIS — T83038A Leakage of other indwelling urethral catheter, initial encounter: Secondary | ICD-10-CM | POA: Insufficient documentation

## 2014-07-16 DIAGNOSIS — Y846 Urinary catheterization as the cause of abnormal reaction of the patient, or of later complication, without mention of misadventure at the time of the procedure: Secondary | ICD-10-CM | POA: Diagnosis not present

## 2014-07-16 DIAGNOSIS — N183 Chronic kidney disease, stage 3 (moderate): Secondary | ICD-10-CM | POA: Insufficient documentation

## 2014-07-16 DIAGNOSIS — Z862 Personal history of diseases of the blood and blood-forming organs and certain disorders involving the immune mechanism: Secondary | ICD-10-CM | POA: Insufficient documentation

## 2014-07-16 DIAGNOSIS — Z87440 Personal history of urinary (tract) infections: Secondary | ICD-10-CM | POA: Diagnosis not present

## 2014-07-16 DIAGNOSIS — E785 Hyperlipidemia, unspecified: Secondary | ICD-10-CM | POA: Insufficient documentation

## 2014-07-16 DIAGNOSIS — T839XXA Unspecified complication of genitourinary prosthetic device, implant and graft, initial encounter: Secondary | ICD-10-CM | POA: Diagnosis not present

## 2014-07-16 DIAGNOSIS — Z7982 Long term (current) use of aspirin: Secondary | ICD-10-CM | POA: Diagnosis not present

## 2014-07-16 DIAGNOSIS — N400 Enlarged prostate without lower urinary tract symptoms: Secondary | ICD-10-CM | POA: Diagnosis not present

## 2014-07-16 DIAGNOSIS — Z87891 Personal history of nicotine dependence: Secondary | ICD-10-CM | POA: Diagnosis not present

## 2014-07-16 DIAGNOSIS — Z792 Long term (current) use of antibiotics: Secondary | ICD-10-CM | POA: Diagnosis not present

## 2014-07-16 DIAGNOSIS — G8929 Other chronic pain: Secondary | ICD-10-CM | POA: Diagnosis not present

## 2014-07-16 DIAGNOSIS — I129 Hypertensive chronic kidney disease with stage 1 through stage 4 chronic kidney disease, or unspecified chronic kidney disease: Secondary | ICD-10-CM | POA: Diagnosis not present

## 2014-07-16 DIAGNOSIS — Z8611 Personal history of tuberculosis: Secondary | ICD-10-CM | POA: Diagnosis not present

## 2014-07-16 NOTE — ED Provider Notes (Signed)
CSN: [id number]     Arrival date & time 07/16/14  1219 History   First MD Initiated Contact with Patient 07/16/14 1302     Chief Complaint  Patient presents with  . Catheter Leaking      (Consider location/radiation/quality/duration/timing/severity/associated sxs/prior Treatment) HPI Comments: The patient is a 64 year old male with history of BPH, indwelling catheter for 3 years presenting to the emergency department with a chief complaint of catheter problems.  The patient reports mild drainage from tubing since last night. He reports increases with increase pressure of leg bag. He denies drainage from the meatus. He reports good urine output, change in bag, patient is unable to see his urologist due to insurance problem at this time. He reports he is able to flush catheter without issues or resistance. Foley catheter was placed 07/11/2014. Urologist: Nesi  The history is provided by the patient. No language interpreter was used.    Past Medical History  Diagnosis Date  . Hypertension   . Dysuria   . UTI (urinary tract infection)   . Tuberculosis     h/o PPD +  . BPH (benign prostatic hyperplasia)   . Anemia     normal Fe, nl B12, nl retic, nl EPO July '13  . Chronic kidney disease     CKD III, obstructive nephropathy  . Elevated PSA, greater than or equal to 20 ng/ml June '13    PSA 107  . Chronic back pain   . Hyperlipidemia 05/29/2014   Past Surgical History  Procedure Laterality Date  . Spleenectomy     Family History  Problem Relation Age of Onset  . Diabetes Mother   . Stroke Brother   . Hearing loss Brother    History  Substance Use Topics  . Smoking status: Former Smoker    Types: Cigarettes  . Smokeless tobacco: Never Used     Comment: Quit 1981  . Alcohol Use: No     Comment: Quit 2004    Review of Systems  Gastrointestinal: Negative for abdominal pain and abdominal distention.  Genitourinary: Negative for decreased urine volume and difficulty urinating.       Allergies  Review of patient's allergies indicates no known allergies.  Home Medications   Prior to Admission medications   Medication Sig Start Date End Date Taking? Authorizing Provider  amLODipine (NORVASC) 5 MG tablet Take 1 tablet (5 mg total) by mouth daily. 05/10/14   Biagio Borg, MD  aspirin 81 MG tablet Take 81 mg by mouth daily.    Historical Provider, MD  atorvastatin (LIPITOR) 20 MG tablet Take 1 tablet (20 mg total) by mouth daily. 05/29/14 05/29/15  Biagio Borg, MD  ciprofloxacin (CIPRO) 500 MG tablet Take 1 tablet (500 mg total) by mouth 2 (two) times daily. 07/11/14   Carlisle Cater, PA-C  finasteride (PROSCAR) 5 MG tablet Take 1 tablet (5 mg total) by mouth daily. 01/18/14   Biagio Borg, MD  gabapentin (NEURONTIN) 300 MG capsule Take 1 capsule (300 mg total) by mouth 3 (three) times daily. 05/23/14   Marcial Pacas, MD  HYDROcodone-acetaminophen (NORCO) 5-325 MG per tablet Take 1-2 tablets by mouth every 4 (four) hours as needed. Patient not taking: Reported on 07/11/2014 06/21/14   Mariea Clonts, MD  labetalol (NORMODYNE) 200 MG tablet Take 1 tablet (200 mg total) by mouth every morning. 05/10/14   Biagio Borg, MD  lidocaine (XYLOCAINE) 5 % ointment Apply 1 application topically as needed for mild pain.  Historical Provider, MD  oxybutynin (DITROPAN) 5 MG tablet Take 1 tablet (5 mg total) by mouth daily. 05/29/14   Biagio Borg, MD   BP 134/84 mmHg  Pulse 66  Temp(Src) 98 F (36.7 C) (Oral)  Resp 16  SpO2 98% Physical Exam  Constitutional: He is oriented to person, place, and time. He appears well-developed and well-nourished. No distress.  HENT:  Head: Normocephalic and atraumatic.  Neck: Neck supple.  Pulmonary/Chest: Effort normal. No respiratory distress.  Abdominal: Soft. He exhibits no distension. There is no tenderness. There is no rebound.  Genitourinary:  Foley catheter in place, no obvious full or signs of leakage to tubing at this time. Urine and  urine bag. No sediment seen.  Neurological: He is oriented to person, place, and time.  Skin: Skin is warm and dry.  Psychiatric: He has a normal mood and affect. His behavior is normal.  Nursing note and vitals reviewed.   ED Course  Procedures (including critical care time) Labs Review Labs Reviewed - No data to display  Imaging Review No results found.   EKG Interpretation None      MDM   Final diagnoses:  Foley catheter problem, initial encounter   Patient with questional leak from Foley will replace. Foley catheter placed waiting urinalysis no sign of obstruction patient is not complaining of obstructive symptoms. Foley catheter with urine output. Encouraged patient to follow-up with his urologist.   Harvie Heck, PA-C 07/16/14 [place], MD 07/17/14 [phone]

## 2014-07-16 NOTE — ED Notes (Signed)
Pt reports urinary catheter leaking x 1 day.  Pt reports have catheter changed at [facility] x 5 days ago and has not followed up w/ Urologist.

## 2014-07-16 NOTE — Discharge Instructions (Signed)
Call for a follow up appointment with a Family or Primary Care Provider.  Call for a follow-up appointment with your urologist. Return if Symptoms worsen.   Take medication as prescribed.

## 2014-07-30 ENCOUNTER — Encounter: Payer: Self-pay | Admitting: Internal Medicine

## 2014-07-30 ENCOUNTER — Ambulatory Visit (INDEPENDENT_AMBULATORY_CARE_PROVIDER_SITE_OTHER): Payer: Medicare Other | Admitting: Internal Medicine

## 2014-07-30 VITALS — BP 128/88 | HR 72 | Resp 16 | Ht 73.0 in | Wt 259.0 lb

## 2014-07-30 DIAGNOSIS — K625 Hemorrhage of anus and rectum: Secondary | ICD-10-CM

## 2014-07-30 DIAGNOSIS — K648 Other hemorrhoids: Secondary | ICD-10-CM | POA: Diagnosis not present

## 2014-07-30 DIAGNOSIS — K5909 Other constipation: Secondary | ICD-10-CM | POA: Diagnosis not present

## 2014-07-30 NOTE — Progress Notes (Signed)
  Referred by Dr. Jenny Reichmann Subjective:    Patient ID: [name], male    DOB: 06/24/1949, 64 y.o.   MRN: [id number] Chief complaint is hemorrhoids HPI The patient is a very nice and from [place], with intermittent rectal bleeding. He has symptoms consistent with protrusion and prolapse of hemorrhoids with manual reduction. This is worse when he has a hard stool. He does suffer with straining at times. He has not tried any specific treatments. He has increased vegetables in his diet but he is not taking a fiber supplement. He has mild anal discomfort but no sharp pain. He is complaining of feeling gas and bloating symptoms as well. He had a colonoscopy by Dr. Verl Blalock in June 2008 which demonstrated diverticulosis. GI review of systems is otherwise unremarkable.  Medications, allergies, past medical history, past surgical history, family history and social history are reviewed and updated in the EMR.   Review of Systems As per history of present illness,  he has had some intermittent epistaxis, pedal edema joint and muscle pains. All other review of systems are negative. He has a chronic indwelling Foley catheter.    Objective:   Physical Exam  BP 128/88 mmHg  Pulse 72  Resp 16  Ht 6\' 1"  (1.854 m)  Wt 259 lb (117.482 kg)  BMI 34.18 kg/m2  General:  Well-developed, well-nourished and in no acute distress Eyes:  anicteric. ENT:   Mouth and posterior pharynx free of lesions.  Neck:   supple w/o thyromegaly or mass.  Lungs: Clear to auscultation bilaterally. Heart:  S1S2, no rubs, murmurs, gallops. Abdomen:  soft, non-tender, no hepatosplenomegaly, hernia, or mass and BS+.   GU - Foley catheter in  Rectal:   Anoderm inspection revealed normal anoderm Digital exam revealed normal resting tone No mass  Present but long anal canal   Anoscopy was performed with the patient in the left lateral decubitus position was present and revealed Grade 2-3 internal hemorrhoids RA and LL - RP  not seen well with long canal   Lymph:  no cervical or supraclavicular adenopathy. Extremities:   no edema Skin   no rash. Neuro:  A&O x 3.  Psych:  appropriate mood and  Affect.   Data Reviewed: Primary care notes, 2008 colonoscopy with photos. Abdomen the EMR      Assessment & Plan:  Rectal bleeding  Internal hemorrhoids with complication - bleeding and prolapse  Other constipation   1. Colonoscopy (last 2008) 2. benefiber 2 tbsp/day 3. Eventual banding - reviewed today The risks and benefits as well as alternatives of endoscopic procedure(s) have been discussed and reviewed. All questions answered. The patient agrees to proceed. I appreciate the opportunity to care for this patient. CC: Cathlean Cower, MD

## 2014-07-30 NOTE — Patient Instructions (Addendum)
You have been scheduled for a colonoscopy. Please follow written instructions given to you at your visit today.  Please pick up your prep supplies at the pharmacy. If you use inhalers (even only as needed), please bring them with you on the day of your procedure.  Today you have been given a handout to read and follow on benefiber.  Today you have been given information to read on hemorrhoid banding.  I appreciate the opportunity to care for you.

## 2014-07-31 ENCOUNTER — Encounter: Payer: Self-pay | Admitting: Internal Medicine

## 2014-08-22 ENCOUNTER — Ambulatory Visit (AMBULATORY_SURGERY_CENTER): Payer: Medicare Other | Admitting: Internal Medicine

## 2014-08-22 ENCOUNTER — Encounter: Payer: Self-pay | Admitting: Internal Medicine

## 2014-08-22 VITALS — BP 131/66 | HR 54 | Temp 98.0°F | Resp 23 | Ht 73.0 in | Wt 259.0 lb

## 2014-08-22 DIAGNOSIS — K625 Hemorrhage of anus and rectum: Secondary | ICD-10-CM

## 2014-08-22 DIAGNOSIS — K5730 Diverticulosis of large intestine without perforation or abscess without bleeding: Secondary | ICD-10-CM

## 2014-08-22 DIAGNOSIS — K648 Other hemorrhoids: Secondary | ICD-10-CM

## 2014-08-22 MED ORDER — SODIUM CHLORIDE 0.9 % IV SOLN: 500.0000 mL | INTRAVENOUS | Status: DC

## 2014-08-22 NOTE — Op Note (Signed)
[facility]  [address]. [place], [id number]   COLONOSCOPY PROCEDURE REPORT  PATIENT: Donald, [name]  MR#: [id number] BIRTHDATE: 06/24/1949 , 73  yrs. old GENDER: male ENDOSCOPIST: Gatha Mayer, MD, [facility] PROCEDURE DATE:  08/22/2014 PROCEDURE:   Colonoscopy, diagnostic First Screening Colonoscopy - Avg.  risk and is 50 yrs.  old or older - No.  Prior Negative Screening - Now for repeat screening. N/A  History of Adenoma - Now for follow-up colonoscopy & has been > or = to 3 yrs.  N/A ASA CLASS:   Class II INDICATIONS:Evaluation of unexplained GI bleeding and Colorectal Neoplasm Risk Assessment for this procedure is average risk. MEDICATIONS: Propofol 250 mg IV and Monitored anesthesia care  DESCRIPTION OF PROCEDURE:   After the risks benefits and alternatives of the procedure were thoroughly explained, informed consent was obtained.  The digital rectal exam revealed no rectal mass.   The LB [id number]  endoscope was introduced through the anus and advanced to the cecum, which was identified by both the appendix and ileocecal valve. No adverse events experienced. The quality of the prep was good.  (MiraLax was used)  The instrument was then slowly withdrawn as the colon was fully examined.  COLON FINDINGS: There was mild diverticulosis noted throughout the entire examined colon.   Internal hemorrhoids were found.   The colon was redundant.  Manual abdominal counter-pressure was used to reach the cecum.   The examination was otherwise normal. Retroflexed views revealed internal hemorrhoids. The time to cecum = 7.9 Withdrawal time = 9.5   The scope was withdrawn and the procedure completed. COMPLICATIONS: There were no immediate complications.  ENDOSCOPIC IMPRESSION: 1.   There was mild diverticulosis noted throughout the entire examined colon 2.   Internal hemorrhoids 3.   The colon was redundant 4.   The examination was otherwise  normal  RECOMMENDATIONS: 1.  Repeat colonoscopy 10 years. 2.  My office will arrange hemorrhoid banding appointment.  eSigned:  Gatha Mayer, MD, [facility] 08/22/2014 3:07 PM   cc: The Patient and Cathlean Cower, MD

## 2014-08-22 NOTE — Progress Notes (Signed)
A/ox3, pleased with MAC, report to RN 

## 2014-08-22 NOTE — Progress Notes (Signed)
No egg or soy allergy No issues with past sedation

## 2014-08-22 NOTE — Patient Instructions (Addendum)
No polyps or cancer.  I saw the hemorrhoids. You also have a condition called diverticulosis - common and not usually a problem. Please read the handout provided.  Next routine colonoscopy in 10 years - 2026  My office will call you about returning to have the hemorrhoids banded.  I appreciate the opportunity to care for you. Gatha Mayer, MD, [facility]   Discharge instructions given. Handouts on diverticulosis and hemorrhoids. Resume previous medications. YOU HAD AN ENDOSCOPIC PROCEDURE TODAY AT [facility]:   Refer to the procedure report that was given to you for any specific questions about what was found during the examination.  If the procedure report does not answer your questions, please call your gastroenterologist to clarify.  If you requested that your care partner not be given the details of your procedure findings, then the procedure report has been included in a sealed envelope for you to review at your convenience later.  YOU SHOULD EXPECT: Some feelings of bloating in the abdomen. Passage of more gas than usual.  Walking can help get rid of the air that was put into your GI tract during the procedure and reduce the bloating. If you had a lower endoscopy (such as a colonoscopy or flexible sigmoidoscopy) you may notice spotting of blood in your stool or on the toilet paper. If you underwent a bowel prep for your procedure, you may not have a normal bowel movement for a few days.  Please Note:  You might notice some irritation and congestion in your nose or some drainage.  This is from the oxygen used during your procedure.  There is no need for concern and it should clear up in a day or so.  SYMPTOMS TO REPORT IMMEDIATELY:   Following lower endoscopy (colonoscopy or flexible sigmoidoscopy):  Excessive amounts of blood in the stool  Significant tenderness or worsening of abdominal pains  Swelling of the abdomen that is new, acute  Fever of 100F or  higher   For urgent or emergent issues, a gastroenterologist can be reached at any hour by calling [phone].   DIET: Your first meal following the procedure should be a small meal and then it is ok to progress to your normal diet. Heavy or fried foods are harder to digest and may make you feel nauseous or bloated.  Likewise, meals heavy in dairy and vegetables can increase bloating.  Drink plenty of fluids but you should avoid alcoholic beverages for 24 hours.  ACTIVITY:  You should plan to take it easy for the rest of today and you should NOT DRIVE or use heavy machinery until tomorrow (because of the sedation medicines used during the test).    FOLLOW UP: Our staff will call the number listed on your records the next business day following your procedure to check on you and address any questions or concerns that you may have regarding the information given to you following your procedure. If we do not reach you, we will leave a message.  However, if you are feeling well and you are not experiencing any problems, there is no need to return our call.  We will assume that you have returned to your regular daily activities without incident.  If any biopsies were taken you will be contacted by phone or by letter within the next 1-3 weeks.  Please call us at [phone] if you have not heard about the biopsies in 3 weeks.    SIGNATURES/CONFIDENTIALITY: You and/or your  care partner have signed paperwork which will be entered into your electronic medical record.  These signatures attest to the fact that that the information above on your After Visit Summary has been reviewed and is understood.  Full responsibility of the confidentiality of this discharge information lies with you and/or your care-partner.

## 2014-08-23 ENCOUNTER — Telehealth: Payer: Self-pay | Admitting: *Deleted

## 2014-08-23 NOTE — Telephone Encounter (Signed)
  Follow up Call-  Call back number 08/22/2014  Post procedure Call Back phone  # [phone]  Permission to leave phone message Yes     Patient questions:  Do you have a fever, pain , or abdominal swelling? No. Pain Score  0 *  Have you tolerated food without any problems? Yes.    Have you been able to return to your normal activities? Yes.    Do you have any questions about your discharge instructions: Diet   No. Medications  No. Follow up visit  No.  Do you have questions or concerns about your Care? No.  Actions: * If pain score is 4 or above: No action needed, pain <4.

## 2014-09-04 ENCOUNTER — Emergency Department (HOSPITAL_COMMUNITY): Payer: Medicare Other

## 2014-09-04 ENCOUNTER — Emergency Department (HOSPITAL_COMMUNITY)
Admission: EM | Admit: 2014-09-04 | Discharge: 2014-09-04 | Disposition: A | Discharge status: Home / Self Care | Payer: Medicare Other | Attending: Emergency Medicine | Admitting: Emergency Medicine

## 2014-09-04 ENCOUNTER — Encounter (HOSPITAL_COMMUNITY): Payer: Self-pay | Admitting: *Deleted

## 2014-09-04 DIAGNOSIS — Z8611 Personal history of tuberculosis: Secondary | ICD-10-CM | POA: Diagnosis not present

## 2014-09-04 DIAGNOSIS — Z87891 Personal history of nicotine dependence: Secondary | ICD-10-CM | POA: Diagnosis not present

## 2014-09-04 DIAGNOSIS — Z862 Personal history of diseases of the blood and blood-forming organs and certain disorders involving the immune mechanism: Secondary | ICD-10-CM | POA: Diagnosis not present

## 2014-09-04 DIAGNOSIS — Z79899 Other long term (current) drug therapy: Secondary | ICD-10-CM | POA: Insufficient documentation

## 2014-09-04 DIAGNOSIS — I129 Hypertensive chronic kidney disease with stage 1 through stage 4 chronic kidney disease, or unspecified chronic kidney disease: Secondary | ICD-10-CM | POA: Insufficient documentation

## 2014-09-04 DIAGNOSIS — R079 Chest pain, unspecified: Secondary | ICD-10-CM | POA: Insufficient documentation

## 2014-09-04 DIAGNOSIS — N183 Chronic kidney disease, stage 3 (moderate): Secondary | ICD-10-CM | POA: Insufficient documentation

## 2014-09-04 DIAGNOSIS — E785 Hyperlipidemia, unspecified: Secondary | ICD-10-CM | POA: Insufficient documentation

## 2014-09-04 DIAGNOSIS — Z87440 Personal history of urinary (tract) infections: Secondary | ICD-10-CM | POA: Insufficient documentation

## 2014-09-04 DIAGNOSIS — Z792 Long term (current) use of antibiotics: Secondary | ICD-10-CM | POA: Diagnosis not present

## 2014-09-04 DIAGNOSIS — M7989 Other specified soft tissue disorders: Secondary | ICD-10-CM | POA: Diagnosis not present

## 2014-09-04 DIAGNOSIS — L03115 Cellulitis of right lower limb: Secondary | ICD-10-CM | POA: Insufficient documentation

## 2014-09-04 DIAGNOSIS — Z7982 Long term (current) use of aspirin: Secondary | ICD-10-CM | POA: Diagnosis not present

## 2014-09-04 DIAGNOSIS — Z8719 Personal history of other diseases of the digestive system: Secondary | ICD-10-CM | POA: Diagnosis not present

## 2014-09-04 DIAGNOSIS — R2241 Localized swelling, mass and lump, right lower limb: Secondary | ICD-10-CM | POA: Diagnosis present

## 2014-09-04 DIAGNOSIS — G8929 Other chronic pain: Secondary | ICD-10-CM | POA: Insufficient documentation

## 2014-09-04 HISTORY — DX: Disorder of kidney and ureter, unspecified: N28.9

## 2014-09-04 LAB — CBC
HCT: 42.4 % (ref 39.0–52.0)
Hemoglobin: 13.9 g/dL (ref 13.0–17.0)
MCH: 28.8 pg (ref 26.0–34.0)
MCHC: 32.8 g/dL (ref 30.0–36.0)
MCV: 88 fL (ref 78.0–100.0)
Platelets: 233 10*3/uL (ref 150–400)
RBC: 4.82 MIL/uL (ref 4.22–5.81)
RDW: 14 % (ref 11.5–15.5)
WBC: 8.5 10*3/uL (ref 4.0–10.5)

## 2014-09-04 LAB — BASIC METABOLIC PANEL
Anion gap: 9 (ref 5–15)
BUN: 37 mg/dL — ABNORMAL HIGH (ref 6–23)
CALCIUM: 9.1 mg/dL (ref 8.4–10.5)
CO2: 33 mmol/L — ABNORMAL HIGH (ref 19–32)
Chloride: 98 mmol/L (ref 96–112)
Creatinine, Ser: 2.28 mg/dL — ABNORMAL HIGH (ref 0.50–1.35)
GFR calc Af Amer: 33 mL/min — ABNORMAL LOW (ref >90)
GFR, EST NON AFRICAN AMERICAN: 28 mL/min — AB (ref >90)
Glucose, Bld: 117 mg/dL — ABNORMAL HIGH (ref 70–99)
POTASSIUM: 3.7 mmol/L (ref 3.5–5.1)
Sodium: 140 mmol/L (ref 135–145)

## 2014-09-04 LAB — I-STAT TROPONIN, ED: Troponin i, poc: 0 ng/mL (ref 0.00–0.08)

## 2014-09-04 LAB — BRAIN NATRIURETIC PEPTIDE: B Natriuretic Peptide: 79.6 pg/mL (ref 0.0–100.0)

## 2014-09-04 LAB — D-DIMER, QUANTITATIVE (NOT AT ARMC): D DIMER QUANT: 1.08 ug{FEU}/mL — AB (ref 0.00–0.48)

## 2014-09-04 MED ORDER — CLINDAMYCIN HCL 300 MG PO CAPS: 300.0000 mg | ORAL_CAPSULE | Freq: Four times a day (QID) | ORAL | Status: DC

## 2014-09-04 MED ORDER — SODIUM CHLORIDE 0.9 % IV BOLUS (SEPSIS)
1000.0000 mL | Freq: Once | INTRAVENOUS | Status: AC
  Administered 2014-09-04: 1000 mL via INTRAVENOUS

## 2014-09-04 MED ORDER — IOHEXOL 350 MG/ML SOLN
80.0000 mL | Freq: Once | INTRAVENOUS | Status: AC | PRN
  Administered 2014-09-04: 80 mL via INTRAVENOUS

## 2014-09-04 NOTE — ED Notes (Signed)
Pt c/o RLE pain/swelling x1 week.  Patient also endorses substernal chest pain 3 days ago and SOB with exertion yesterday.  On exam, patient's lung sounds are diminished and clear.  Respiratory effort is normal with bilateral rise/fall of chest and no accessory muscle use.  +3 radial pulses, +2 pedal pulses.  Non-pitting edema to RLE.  Patient in NAD at this time.

## 2014-09-04 NOTE — ED Notes (Signed)
Dr. Debby Freiberg aware of pts. Renal function, Spoke with Dr. Jeneen Rinks Maxwell/Radiologist @15 :20/3-30-16,  regarding Mr. Batterton creatinine levels, Creatinine 2.28 bun 37, 22 lbs/64 years old 11'1", GFR 63, Dr. Lorriane Shire approved to give pt. IV cm to him. Mr. Burgen had questions for Dr. Colin Rhein. Dr. Debby Freiberg notified of Radiologist's decision and also explained to the pt.the risks and benefits of our IV cm after, pt. Agreed to proceed with his CTA of his Chest.Pt. Also stated his is not on dialysis

## 2014-09-04 NOTE — ED Notes (Signed)
Dr. Debby Freiberg aware of pts. Renal function, Spoke with Dr. Jeneen Rinks Maxwell/Radiologist @15 :20/3-30-16,  regarding [name] creatinine levels, Creatinine 2.28 bun 37, 54 lbs/64 years old 24'1", GFR 61, Dr. Lorriane Shire approved to give pt. IV cm to him. [name] had questions for Dr. Colin Rhein. Dr. Debby Freiberg notified of Radiologist's decision and also explained to the pt.the risks and benefits of our IV cm after, pt. Agreed to proceed with his CTA of his Chest.

## 2014-09-04 NOTE — ED Notes (Signed)
Patient transported to CT 

## 2014-09-04 NOTE — ED Provider Notes (Signed)
CSN: [id number]     Arrival date & time 09/04/14  1225 History   First MD Initiated Contact with Patient 09/04/14 1234     Chief Complaint  Patient presents with  . Foot Swelling  . Chest Pain     (Consider location/radiation/quality/duration/timing/severity/associated sxs/prior Treatment) Patient is a 64 y.o. male presenting with chest pain and leg pain.  Chest Pain Pain location:  Substernal area Pain quality: sharp   Pain radiates to:  Does not radiate Pain radiates to the back: no   Pain severity:  Moderate Onset quality:  Sudden Duration: very brief. Timing:  Intermittent Progression:  Waxing and waning Chronicity:  New Context comment:  With RLE swelling Relieved by:  Nothing Worsened by:  Nothing tried Ineffective treatments:  None tried Associated symptoms: no abdominal pain, no cough, no fever, no nausea, no shortness of breath and not vomiting   Leg Pain Location:  Ankle Time since incident:  1 week Injury: no   Ankle location:  R ankle Pain details:    Quality:  Aching   Radiates to:  Does not radiate   Severity:  Moderate   Onset quality:  Gradual   Duration:  1 week   Timing:  Constant   Progression:  Worsening Chronicity:  New Dislocation: no   Foreign body present:  No foreign bodies Prior injury to area:  No Relieved by:  Nothing Worsened by:  Abduction, bearing weight, flexion and extension Ineffective treatments:  None tried Associated symptoms: no fever     Past Medical History  Diagnosis Date  . Hypertension   . Dysuria   . UTI (urinary tract infection)   . Tuberculosis     h/o PPD +  . BPH (benign prostatic hyperplasia)   . Anemia     normal Fe, nl B12, nl retic, nl EPO July '13  . Chronic kidney disease     CKD III, obstructive nephropathy  . Elevated PSA, greater than or equal to 20 ng/ml June '13    PSA 107  . Chronic back pain   . Hyperlipidemia 05/29/2014  . Diverticulosis   . Blood transfusion without reported diagnosis    . Renal insufficiency    Past Surgical History  Procedure Laterality Date  . Spleenectomy    . Colonoscopy    . Carpal tunnel release Right    Family History  Problem Relation Age of Onset  . Diabetes Mother   . Stroke Brother   . Hearing loss Brother   . Colon cancer Neg Hx   . Rectal cancer Neg Hx   . Stomach cancer Neg Hx   . Esophageal cancer Neg Hx    History  Substance Use Topics  . Smoking status: Former Smoker    Types: Cigarettes  . Smokeless tobacco: Never Used     Comment: Quit 1981  . Alcohol Use: No     Comment: Quit 2004    Review of Systems  Constitutional: Negative for fever.  Respiratory: Negative for cough and shortness of breath.   Cardiovascular: Positive for chest pain.  Gastrointestinal: Negative for nausea, vomiting and abdominal pain.  All other systems reviewed and are negative.     Allergies  Review of patient's allergies indicates no known allergies.  Home Medications   Prior to Admission medications   Medication Sig Start Date End Date Taking? Authorizing Provider  acetaminophen (TYLENOL) 500 MG tablet Take 1,000 mg by mouth every 6 (six) hours as needed for moderate pain or headache.  Yes Historical Provider, MD  amLODipine (NORVASC) 5 MG tablet Take 1 tablet (5 mg total) by mouth daily. 05/10/14  Yes Biagio Borg, MD  aspirin 81 MG tablet Take 81 mg by mouth daily.   Yes Historical Provider, MD  atorvastatin (LIPITOR) 20 MG tablet Take 1 tablet (20 mg total) by mouth daily. 05/29/14 05/29/15 Yes Biagio Borg, MD  finasteride (PROSCAR) 5 MG tablet Take 1 tablet (5 mg total) by mouth daily. 01/18/14  Yes Biagio Borg, MD  gabapentin (NEURONTIN) 300 MG capsule Take 1 capsule (300 mg total) by mouth 3 (three) times daily. 05/23/14  Yes Marcial Pacas, MD  ibuprofen (ADVIL,MOTRIN) 200 MG tablet Take 400 mg by mouth every 6 (six) hours as needed for headache or moderate pain.   Yes Historical Provider, MD  labetalol (NORMODYNE) 200 MG tablet  Take 1 tablet (200 mg total) by mouth every morning. 05/10/14  Yes Biagio Borg, MD  Menthol, Topical Analgesic, (BENGAY EX) Apply 1 application topically 2 (two) times daily.   Yes Historical Provider, MD  Menthol, Topical Analgesic, (ICY HOT EX) Apply 1 application topically 2 (two) times daily.   Yes Historical Provider, MD  oxybutynin (DITROPAN) 5 MG tablet Take 1 tablet (5 mg total) by mouth daily. 05/29/14  Yes Biagio Borg, MD  tamsulosin (FLOMAX) 0.4 MG CAPS capsule Take 0.4 mg by mouth daily.   Yes Historical Provider, MD  clindamycin (CLEOCIN) 300 MG capsule Take 1 capsule (300 mg total) by mouth 4 (four) times daily. X 7 days 09/04/14   Debby Freiberg, MD   BP 161/106 mmHg  Pulse 64  Temp(Src) 98.2 F (36.8 C) (Oral)  Resp 19  SpO2 97% Physical Exam  Constitutional: He is oriented to person, place, and time. He appears well-developed and well-nourished.  HENT:  Head: Normocephalic and atraumatic.  Eyes: Conjunctivae and EOM are normal.  Neck: Normal range of motion. Neck supple.  Cardiovascular: Normal rate, regular rhythm and normal heart sounds.   Pulmonary/Chest: Effort normal and breath sounds normal. No respiratory distress.  Abdominal: He exhibits no distension. There is no tenderness. There is no rebound and no guarding.  Musculoskeletal: Normal range of motion.       Left ankle: He exhibits swelling. He exhibits normal range of motion.       Right lower leg: He exhibits tenderness and swelling.       Right foot: There is tenderness and swelling. There is normal range of motion and no bony tenderness.  Neurological: He is alert and oriented to person, place, and time.  Skin: Skin is warm and dry.  Vitals reviewed.   ED Course  Procedures (including critical care time) Labs Review Labs Reviewed  BASIC METABOLIC PANEL - Abnormal; Notable for the following:    CO2 33 (*)    Glucose, Bld 117 (*)    BUN 37 (*)    Creatinine, Ser 2.28 (*)    GFR calc non Af Amer 28  (*)    GFR calc Af Amer 33 (*)    All other components within normal limits  D-DIMER, QUANTITATIVE - Abnormal; Notable for the following:    D-Dimer, Quant 1.08 (*)    All other components within normal limits  CBC  BRAIN NATRIURETIC PEPTIDE  I-STAT TROPOININ, ED    Imaging Review Ct Angio Chest Pe W/cm &/or Wo Cm  09/04/2014   CLINICAL DATA:  Right lower extremity pain and swelling for 1 week. Sub sternal chest pain  3 days ago and short of breath with exertion today. Question pulmonary embolism.  EXAM: CT ANGIOGRAPHY CHEST WITH CONTRAST  TECHNIQUE: Multidetector CT imaging of the chest was performed using the standard protocol during bolus administration of intravenous contrast. Multiplanar CT image reconstructions and MIPs were obtained to evaluate the vascular anatomy.  CONTRAST:  45mL OMNIPAQUE IOHEXOL 350 MG/ML SOLN  COMPARISON:  Chest radiograph 06/03/2012.  No prior CT.  FINDINGS: Mediastinum/Nodes: The quality of this exam for evaluation of pulmonary embolism is moderate. Suboptimal bolus timing, with contrast centered in the SVC. No pulmonary embolism to the large segmental level.  Suspect a subcentimeter nonspecific right-sided thyroid nodule. Aorta not well opacified. No aneurysm or dominant dissection. Tortuous descending thoracic aorta. Moderate cardiomegaly, without pericardial effusion. No mediastinal or hilar adenopathy.  Lungs/Pleura: Trace right-sided pleural fluid. Minimal left pleural fluid or thickening. Minimal exclusion of the lung bases. Left lower lobe pulmonary nodule which measures 1.0 cm on image 59 of series 8 and coronal image 78. Sagittal image 127. Present and similar in retrospect on 11/24/2011, image 3.  No lobar consolidation.  Upper abdomen: Normal imaged portions of the liver, stomach. Splenectomy with left upper quadrant splenosis.  Musculoskeletal: No acute osseous abnormality.  Review of the MIP images confirms the above findings.  IMPRESSION: 1. Moderate quality  examination for pulmonary embolism, secondary to suboptimal bolus timing. No large pulmonary embolism identified. 2. Trace right greater than left pleural effusions. 3. Cardiomegaly. 4. Left lower lobe pulmonary nodule, present back to 11/24/2011, most consistent with a benign etiology.   Electronically Signed   By: Abigail Miyamoto M.D.   On: 09/04/2014 16:28   Dg Foot Complete Right  09/04/2014   CLINICAL DATA:  Right foot swelling and pain 1 week, no known injury  EXAM: RIGHT FOOT COMPLETE - 3+ VIEW  COMPARISON:  None.  FINDINGS: Negative for fracture.  Mild degenerative change in the first MTP joint. Moderate degenerative change in the midfoot. Calcaneal spurring.  IMPRESSION: Negative for fracture.   Electronically Signed   By: Franchot Gallo M.D.   On: 09/04/2014 13:34     EKG Interpretation None      MDM   Final diagnoses:  Cellulitis of right lower extremity    64 y.o. male with pertinent PMH of HTN, CKD(? Obstructive by records, pt unaware of etiology) presents with chest pain and RLE swelling.  Physical exam as above.  No recent travel or other concerning risk for DVT.  DDimer checked and elevated.  [place] without DVT.  Exam did demonstrated erythema and warmth as above.  Given concurrent pleuritic chest pain on L, will obtain CT scan to ro PE.  I discussed potential for worsening renal function with CT IV and with shared decision making we agreed that risk of missing PE outweighed risk of renal impairment.  NS bolus given prior to and after CT scan to attempt to minimize risk.   Negative PE, dc home in stable condition  I have reviewed all laboratory and imaging studies if ordered as above  1. Cellulitis of right lower extremity         Debby Freiberg, MD 09/05/14 [phone]

## 2014-09-04 NOTE — ED Notes (Addendum)
Pt complains of pain and swelling in his left foot for 1 week. Pt denies injury but states he has difficulty walking. Pain is worse when bearing weight. Pt states he had a similar issue with his left foot 6 weeks ago, was seen at [facility]. Pt is using his cam walker boot from his visit 6 weeks ago.

## 2014-09-04 NOTE — Progress Notes (Signed)
*  PRELIMINARY RESULTS* Vascular Ultrasound Right lower extremity venous duplex has been completed.  Preliminary findings: No evidence of DVT.  Landry Mellow, RDMS, RVT  09/04/2014, 2:51 PM

## 2014-09-04 NOTE — Discharge Instructions (Signed)

## 2014-09-04 NOTE — ED Provider Notes (Signed)
MSE was initiated and I personally evaluated the patient and placed orders (if any) at  12:47 PM on September 04, 2014.  Patient with history of hypertension, CK D, hyperlipidemia and anemia presenting with right foot swelling 1 week. No known injury or trauma. Pain occasionally wakes him up at night. Since the onset of the foot swelling, he's been experiencing intermittent episodes of a sharp, stabbing pain "threw my heart". Last episode was yesterday. Admits to occasional shortness of breath on exertion. I do not feel patient is fast track appropriate, and will be moved to the main side of the emergency departments. Labs, EKG and chest x-ray pending.  The patient appears stable so that the remainder of the MSE may be completed by another provider.  Carman Ching, PA-C 09/04/14 1249  Debby Freiberg, MD 09/05/14 [phone]

## 2014-09-05 ENCOUNTER — Encounter: Payer: Medicare Other | Admitting: Internal Medicine

## 2014-09-19 ENCOUNTER — Encounter: Payer: Self-pay | Admitting: Internal Medicine

## 2014-09-19 ENCOUNTER — Ambulatory Visit (INDEPENDENT_AMBULATORY_CARE_PROVIDER_SITE_OTHER): Payer: Medicare Other | Admitting: Internal Medicine

## 2014-09-19 VITALS — BP 126/80 | HR 64 | Wt 260.0 lb

## 2014-09-19 DIAGNOSIS — K648 Other hemorrhoids: Secondary | ICD-10-CM

## 2014-09-19 HISTORY — DX: Other hemorrhoids: K64.8

## 2014-09-19 NOTE — Assessment & Plan Note (Signed)
RA banded Benefiber 2 tbsp daily RTC May

## 2014-09-19 NOTE — Progress Notes (Signed)
Patient ID: [name], male   DOB: 06/24/1949, 64 y.o.   MRN: [id number]        Hx chronic recurrent rectal bleeding and prolapse.  Rectal:  NL anoderm Long anal canal, no sig spasm, no mass   Anoscopy was performed with the patient in the left lateral decubitus position while a chaperone was present and revealed Grade 2 internal hemorrhoids in all positions.  PROCEDURE NOTE: The patient presents with symptomatic grade 2  hemorrhoids, requesting rubber band ligation of his/her hemorrhoidal disease.  All risks, benefits and alternative forms of therapy were described and informed consent was obtained.   The decision was made to band the RA internal hemorrhoid, and the [place]ington was used to perform band ligation without complication.  Digital anorectal examination was then performed to assure proper positioning of the band, and to adjust the banded tissue as required.  The patient was discharged home without pain or other issues.  Dietary and behavioral recommendations were given and along with follow-up instructions.     The following adjunctive treatments were recommended:  Benefiber  The patient will return in May for  follow-up and possible additional banding as required. No complications were encountered and the patient tolerated the procedure well.

## 2014-09-19 NOTE — Patient Instructions (Signed)
Your follow up banding appointment with Dr. Carlean Purl is on 10/17/14 at 1:45pm.   Pleas take Benefiber 2 tablespoons daily.   HEMORRHOID BANDING PROCEDURE    FOLLOW-UP CARE   1. The procedure you have had should have been relatively painless since the banding of the area involved does not have nerve endings and there is no pain sensation.  The rubber band cuts off the blood supply to the hemorrhoid and the band may fall off as soon as 48 hours after the banding (the band may occasionally be seen in the toilet bowl following a bowel movement). You may notice a temporary feeling of fullness in the rectum which should respond adequately to plain Tylenol or Motrin.  2. Following the banding, avoid strenuous exercise that evening and resume full activity the next day.  A sitz bath (soaking in a warm tub) or bidet is soothing, and can be useful for cleansing the area after bowel movements.     3. To avoid constipation, take two tablespoons of natural wheat bran, natural oat bran, flax, Benefiber or any over the counter fiber supplement and increase your water intake to 7-8 glasses daily.    4. Unless you have been prescribed anorectal medication, do not put anything inside your rectum for two weeks: No suppositories, enemas, fingers, etc.  5. Occasionally, you may have more bleeding than usual after the banding procedure.  This is often from the untreated hemorrhoids rather than the treated one.  Don't be concerned if there is a tablespoon or so of blood.  If there is more blood than this, lie flat with your bottom higher than your head and apply an ice pack to the area. If the bleeding does not stop within a half an hour or if you feel faint, call our office at [phone] or go to the emergency room.  6. Problems are not common; however, if there is a substantial amount of bleeding, severe pain, chills, fever or difficulty passing urine (very rare) or other problems, you should call us at [phone]  [phone] or report to the nearest emergency room.  7. Do not stay seated continuously for more than 2-3 hours for a day or two after the procedure.  Tighten your buttock muscles 10-15 times every two hours and take 10-15 deep breaths every 1-2 hours.  Do not spend more than a few minutes on the toilet if you cannot empty your bowel; instead re-visit the toilet at a later time.

## 2014-09-25 ENCOUNTER — Encounter: Payer: Self-pay | Admitting: Internal Medicine

## 2014-09-25 ENCOUNTER — Ambulatory Visit (INDEPENDENT_AMBULATORY_CARE_PROVIDER_SITE_OTHER): Payer: Medicare Other | Admitting: Internal Medicine

## 2014-09-25 VITALS — BP 128/80 | HR 78 | Temp 98.8°F | Resp 18 | Ht 73.0 in | Wt 259.0 lb

## 2014-09-25 DIAGNOSIS — R059 Cough, unspecified: Secondary | ICD-10-CM

## 2014-09-25 DIAGNOSIS — R05 Cough: Secondary | ICD-10-CM | POA: Diagnosis not present

## 2014-09-25 DIAGNOSIS — I10 Essential (primary) hypertension: Secondary | ICD-10-CM

## 2014-09-25 DIAGNOSIS — R7309 Other abnormal glucose: Secondary | ICD-10-CM | POA: Diagnosis not present

## 2014-09-25 MED ORDER — HYDROCODONE-HOMATROPINE 5-1.5 MG/5ML PO SYRP: 5.0000 mL | ORAL_SOLUTION | Freq: Four times a day (QID) | ORAL | Status: DC | PRN

## 2014-09-25 MED ORDER — LEVOFLOXACIN 250 MG PO TABS: 250.0000 mg | ORAL_TABLET | Freq: Every day | ORAL | Status: DC

## 2014-09-25 NOTE — Progress Notes (Signed)
Pre visit review using our clinic review tool, if applicable. No additional management support is needed unless otherwise documented below in the visit note. 

## 2014-09-25 NOTE — Assessment & Plan Note (Signed)
stable overall by history and exam, recent data reviewed with pt, and pt to continue medical treatment as before,  to f/u any worsening symptoms or concerns Lab Results  Component Value Date   HGBA1C 6.0 05/29/2014   To call for onset polys with illness

## 2014-09-25 NOTE — Assessment & Plan Note (Signed)
stable overall by history and exam, recent data reviewed with pt, and pt to continue medical treatment as before,  to f/u any worsening symptoms or concerns BP Readings from Last 3 Encounters:  09/25/14 128/80  09/19/14 126/80  09/04/14 161/106

## 2014-09-25 NOTE — Assessment & Plan Note (Signed)
Likely infectious, ? bronchtis vs pna, Mild to mod, for antibx course,  to f/u any worsening symptoms or concerns

## 2014-09-25 NOTE — Patient Instructions (Signed)
Please take all new medication as prescribed - the antibiotic, and cough medicine  Please continue all other medications as before, and refills have been done if requested.  Please have the pharmacy call with any other refills you may need.  Please keep your appointments with your specialists as you may have planned  Please return in 6 months, or sooner if needed

## 2014-09-25 NOTE — Progress Notes (Signed)
Subjective:    Patient ID: [name], male    DOB: 06/24/1949, 64 y.o.   MRN: [id number]  HPI  Here with acute onset mild to mod 2-3 days ST, HA, general weakness and malaise, with prod cough greenish sputum, but Pt denies chest pain, increased sob or doe, wheezing, orthopnea, PND, increased LE swelling, palpitations, dizziness or syncope.  Had chest CTA mar 30 - no acute.  Pt denies new neurological symptoms such as new headache, or facial or extremity weakness or numbness   Pt denies polydipsia, polyuria Past Medical History  Diagnosis Date  . Hypertension   . Dysuria   . UTI (urinary tract infection)   . Tuberculosis     h/o PPD +  . BPH (benign prostatic hyperplasia)   . Anemia     normal Fe, nl B12, nl retic, nl EPO July '13  . Chronic kidney disease     CKD III, obstructive nephropathy  . Elevated PSA, greater than or equal to 20 ng/ml June '13    PSA 107  . Chronic back pain   . Hyperlipidemia 05/29/2014  . Diverticulosis   . Blood transfusion without reported diagnosis   . Renal insufficiency   . Hemorrhoids, internal, with bleeding, prolapse 09/19/2014   Past Surgical History  Procedure Laterality Date  . Splenectomy    . Colonoscopy    . Carpal tunnel release Right   . Hemorrhoid banding      reports that he has quit smoking. His smoking use included Cigarettes. He has never used smokeless tobacco. He reports that he does not drink alcohol or use illicit drugs. family history includes Diabetes in his mother; Hearing loss in his brother; Stroke in his brother. There is no history of Colon cancer, Rectal cancer, Stomach cancer, or Esophageal cancer. No Known Allergies Current Outpatient Prescriptions on File Prior to Visit  Medication Sig Dispense Refill  . acetaminophen (TYLENOL) 500 MG tablet Take 1,000 mg by mouth every 6 (six) hours as needed for moderate pain or headache.    Marland Kitchen amLODipine (NORVASC) 5 MG tablet Take 1 tablet (5 mg total) by mouth daily. 90 tablet  3  . aspirin 81 MG tablet Take 81 mg by mouth daily.    Marland Kitchen atorvastatin (LIPITOR) 20 MG tablet Take 1 tablet (20 mg total) by mouth daily. 90 tablet 3  . clindamycin (CLEOCIN) 300 MG capsule Take 1 capsule (300 mg total) by mouth 4 (four) times daily. X 7 days 28 capsule 0  . finasteride (PROSCAR) 5 MG tablet Take 1 tablet (5 mg total) by mouth daily. 90 tablet 2  . gabapentin (NEURONTIN) 300 MG capsule Take 1 capsule (300 mg total) by mouth 3 (three) times daily. 90 capsule 11  . ibuprofen (ADVIL,MOTRIN) 200 MG tablet Take 400 mg by mouth every 6 (six) hours as needed for headache or moderate pain.    Marland Kitchen labetalol (NORMODYNE) 200 MG tablet Take 1 tablet (200 mg total) by mouth every morning. 90 tablet 3  . Menthol, Topical Analgesic, (BENGAY EX) Apply 1 application topically 2 (two) times daily.    . Menthol, Topical Analgesic, (ICY HOT EX) Apply 1 application topically 2 (two) times daily.    Marland Kitchen oxybutynin (DITROPAN) 5 MG tablet Take 1 tablet (5 mg total) by mouth daily. 90 tablet 3  . tamsulosin (FLOMAX) 0.4 MG CAPS capsule Take 0.4 mg by mouth daily.     No current facility-administered medications on file prior to visit.    Review  of Systems  Constitutional: Negative for unusual diaphoresis or night sweats HENT: Negative for ringing in ear or discharge Eyes: Negative for double vision or worsening visual disturbance.  Respiratory: Negative for choking and stridor.   Gastrointestinal: Negative for vomiting or other signifcant bowel change Genitourinary: Negative for hematuria or change in urine volume.  Musculoskeletal: Negative for other MSK pain or swelling Skin: Negative for color change and worsening wound.  Neurological: Negative for tremors and numbness other than noted  Psychiatric/Behavioral: Negative for decreased concentration or agitation other than above       Objective:   Physical Exam BP 128/80 mmHg  Pulse 78  Temp(Src) 98.8 F (37.1 C) (Oral)  Resp 18  Ht 6\' 1"   (1.854 m)  Wt 259 lb 0.6 oz (117.5 kg)  BMI 34.18 kg/m2  SpO2 94% VS noted, mild ill Constitutional: Pt appears in no significant distress HENT: Head: NCAT.  Right Ear: External ear normal.  Left Ear: External ear normal.  Bilat tm's with mild erythema.  Max sinus areas non tender.  Pharynx with mild erythema, no exudate Eyes: . Pupils are equal, round, and reactive to light. Conjunctivae and EOM are normal Neck: Normal range of motion. Neck supple.  Cardiovascular: Normal rate and regular rhythm.   Pulmonary/Chest: Effort normal and breath sounds without rales or wheezing.  Neurological: Pt is alert. Not confused , motor grossly intact Skin: Skin is warm. No rash, no LE edema Psychiatric: Pt behavior is normal. No agitation.      Assessment & Plan:

## 2014-09-30 DIAGNOSIS — Z0279 Encounter for issue of other medical certificate: Secondary | ICD-10-CM

## 2014-10-17 ENCOUNTER — Encounter: Payer: Self-pay | Admitting: Internal Medicine

## 2014-10-17 ENCOUNTER — Ambulatory Visit (INDEPENDENT_AMBULATORY_CARE_PROVIDER_SITE_OTHER): Payer: Medicare Other | Admitting: Internal Medicine

## 2014-10-17 VITALS — BP 116/80 | HR 64 | Ht 72.25 in | Wt 255.0 lb

## 2014-10-17 DIAGNOSIS — K648 Other hemorrhoids: Secondary | ICD-10-CM | POA: Diagnosis not present

## 2014-10-17 NOTE — Progress Notes (Signed)
Patient ID: [name], male   DOB: 06/24/1949, 64 y.o.   MRN: [id number]        Reports less prolapse and bleeding and better cleansing since RA banding  PROCEDURE NOTE: The patient presents with symptomatic grade 2  hemorrhoids, requesting rubber band ligation of his/her hemorrhoidal disease.  All risks, benefits and alternative forms of therapy were described and informed consent was obtained.   The anorectum was pre-medicated with0.125% NTG and 5% lidocaineThe decision was made to band the LL and RP internal hemorrhoid, and the [place] was used to perform band ligation without complication.  Digital anorectal examination was then performed to assure proper positioning of the band, and to adjust the banded tissue as required.  The patient was discharged home without pain or other issues.  Dietary and behavioral recommendations were given and along with follow-up instructions.     The following adjunctive treatments were recommended:  Benefiber  The patient will return in prn for  follow-up and possible additional banding as required. No complications were encountered and the patient tolerated the procedure well.

## 2014-10-17 NOTE — Patient Instructions (Addendum)
HEMORRHOID BANDING PROCEDURE    FOLLOW-UP CARE   1. The procedure you have had should have been relatively painless since the banding of the area involved does not have nerve endings and there is no pain sensation.  The rubber band cuts off the blood supply to the hemorrhoid and the band may fall off as soon as 48 hours after the banding (the band may occasionally be seen in the toilet bowl following a bowel movement). You may notice a temporary feeling of fullness in the rectum which should respond adequately to plain Tylenol or Motrin.  2. Following the banding, avoid strenuous exercise that evening and resume full activity the next day.  A sitz bath (soaking in a warm tub) or bidet is soothing, and can be useful for cleansing the area after bowel movements.     3. To avoid constipation, take two tablespoons of natural wheat bran, natural oat bran, flax, Benefiber or any over the counter fiber supplement and increase your water intake to 7-8 glasses daily.    4. Unless you have been prescribed anorectal medication, do not put anything inside your rectum for two weeks: No suppositories, enemas, fingers, etc.  5. Occasionally, you may have more bleeding than usual after the banding procedure.  This is often from the untreated hemorrhoids rather than the treated one.  Don't be concerned if there is a tablespoon or so of blood.  If there is more blood than this, lie flat with your bottom higher than your head and apply an ice pack to the area. If the bleeding does not stop within a half an hour or if you feel faint, call our office at [phone] or go to the emergency room.  6. Problems are not common; however, if there is a substantial amount of bleeding, severe pain, chills, fever or difficulty passing urine (very rare) or other problems, you should call us at [phone] or report to the nearest emergency room.  7. Do not stay seated continuously for more than 2-3 hours for a day or two  after the procedure.  Tighten your buttock muscles 10-15 times every two hours and take 10-15 deep breaths every 1-2 hours.  Do not spend more than a few minutes on the toilet if you cannot empty your bowel; instead re-visit the toilet at a later time.   After 2 months if you are still having problems call our office at [phone]   I appreciate the opportunity to care for you.

## 2014-10-17 NOTE — Assessment & Plan Note (Addendum)
LL and RP banded F/u prn

## 2014-10-20 ENCOUNTER — Encounter (HOSPITAL_COMMUNITY): Payer: Self-pay | Admitting: Emergency Medicine

## 2014-10-20 ENCOUNTER — Emergency Department (HOSPITAL_COMMUNITY)
Admission: EM | Admit: 2014-10-20 | Discharge: 2014-10-20 | Disposition: A | Discharge status: Home / Self Care | Payer: Medicare Other | Attending: Emergency Medicine | Admitting: Emergency Medicine

## 2014-10-20 DIAGNOSIS — K6289 Other specified diseases of anus and rectum: Secondary | ICD-10-CM

## 2014-10-20 DIAGNOSIS — Z7982 Long term (current) use of aspirin: Secondary | ICD-10-CM | POA: Diagnosis not present

## 2014-10-20 DIAGNOSIS — N183 Chronic kidney disease, stage 3 (moderate): Secondary | ICD-10-CM | POA: Diagnosis not present

## 2014-10-20 DIAGNOSIS — G8929 Other chronic pain: Secondary | ICD-10-CM | POA: Diagnosis not present

## 2014-10-20 DIAGNOSIS — Z87891 Personal history of nicotine dependence: Secondary | ICD-10-CM | POA: Diagnosis not present

## 2014-10-20 DIAGNOSIS — K625 Hemorrhage of anus and rectum: Secondary | ICD-10-CM

## 2014-10-20 DIAGNOSIS — Z8719 Personal history of other diseases of the digestive system: Secondary | ICD-10-CM | POA: Diagnosis not present

## 2014-10-20 DIAGNOSIS — Z862 Personal history of diseases of the blood and blood-forming organs and certain disorders involving the immune mechanism: Secondary | ICD-10-CM | POA: Diagnosis not present

## 2014-10-20 DIAGNOSIS — Z79899 Other long term (current) drug therapy: Secondary | ICD-10-CM | POA: Diagnosis not present

## 2014-10-20 DIAGNOSIS — I129 Hypertensive chronic kidney disease with stage 1 through stage 4 chronic kidney disease, or unspecified chronic kidney disease: Secondary | ICD-10-CM | POA: Insufficient documentation

## 2014-10-20 DIAGNOSIS — Z87438 Personal history of other diseases of male genital organs: Secondary | ICD-10-CM | POA: Insufficient documentation

## 2014-10-20 DIAGNOSIS — E785 Hyperlipidemia, unspecified: Secondary | ICD-10-CM | POA: Insufficient documentation

## 2014-10-20 DIAGNOSIS — Z87440 Personal history of urinary (tract) infections: Secondary | ICD-10-CM | POA: Diagnosis not present

## 2014-10-20 LAB — URINALYSIS, ROUTINE W REFLEX MICROSCOPIC
BILIRUBIN URINE: NEGATIVE
Glucose, UA: NEGATIVE mg/dL
Ketones, ur: NEGATIVE mg/dL
Nitrite: POSITIVE — AB
PROTEIN: NEGATIVE mg/dL
Specific Gravity, Urine: 1.01 (ref 1.005–1.030)
UROBILINOGEN UA: 0.2 mg/dL (ref 0.0–1.0)
pH: 6 (ref 5.0–8.0)

## 2014-10-20 LAB — URINE MICROSCOPIC-ADD ON

## 2014-10-20 LAB — COMPREHENSIVE METABOLIC PANEL
ALT: 12 U/L — ABNORMAL LOW (ref 17–63)
ANION GAP: 10 (ref 5–15)
AST: 17 U/L (ref 15–41)
Albumin: 3.6 g/dL (ref 3.5–5.0)
Alkaline Phosphatase: 81 U/L (ref 38–126)
BILIRUBIN TOTAL: 0.7 mg/dL (ref 0.3–1.2)
BUN: 23 mg/dL — ABNORMAL HIGH (ref 6–20)
CALCIUM: 8.8 mg/dL — AB (ref 8.9–10.3)
CO2: 29 mmol/L (ref 22–32)
CREATININE: 2.09 mg/dL — AB (ref 0.61–1.24)
Chloride: 102 mmol/L (ref 101–111)
GFR calc Af Amer: 37 mL/min — ABNORMAL LOW (ref >60)
GFR calc non Af Amer: 32 mL/min — ABNORMAL LOW (ref >60)
Glucose, Bld: 87 mg/dL (ref 65–99)
Potassium: 3.7 mmol/L (ref 3.5–5.1)
SODIUM: 141 mmol/L (ref 135–145)
Total Protein: 7.4 g/dL (ref 6.5–8.1)

## 2014-10-20 LAB — CBC WITH DIFFERENTIAL/PLATELET
Basophils Absolute: 0 10*3/uL (ref 0.0–0.1)
Basophils Relative: 1 % (ref 0–1)
EOS ABS: 0.3 10*3/uL (ref 0.0–0.7)
EOS PCT: 4 % (ref 0–5)
HEMATOCRIT: 41.9 % (ref 39.0–52.0)
Hemoglobin: 13.8 g/dL (ref 13.0–17.0)
Lymphocytes Relative: 41 % (ref 12–46)
Lymphs Abs: 2.6 10*3/uL (ref 0.7–4.0)
MCH: 28.6 pg (ref 26.0–34.0)
MCHC: 32.9 g/dL (ref 30.0–36.0)
MCV: 86.9 fL (ref 78.0–100.0)
MONOS PCT: 9 % (ref 3–12)
Monocytes Absolute: 0.6 10*3/uL (ref 0.1–1.0)
Neutro Abs: 2.9 10*3/uL (ref 1.7–7.7)
Neutrophils Relative %: 45 % (ref 43–77)
PLATELETS: 268 10*3/uL (ref 150–400)
RBC: 4.82 MIL/uL (ref 4.22–5.81)
RDW: 14.2 % (ref 11.5–15.5)
WBC: 6.5 10*3/uL (ref 4.0–10.5)

## 2014-10-20 MED ORDER — POLYETHYLENE GLYCOL 3350 17 G PO PACK: 17.0000 g | PACK | Freq: Every day | ORAL | Status: DC | PRN

## 2014-10-20 MED ORDER — HYDROMORPHONE HCL 1 MG/ML IJ SOLN
0.5000 mg | Freq: Once | INTRAMUSCULAR | Status: AC
Start: 2014-10-20 — End: 2014-10-20
  Administered 2014-10-20: 0.5 mg via INTRAMUSCULAR
  Filled 2014-10-20: qty 1

## 2014-10-20 MED ORDER — HYDROMORPHONE HCL 1 MG/ML IJ SOLN: 1.0000 mg | Freq: Once | INTRAMUSCULAR | Status: DC

## 2014-10-20 MED ORDER — OXYCODONE-ACETAMINOPHEN 5-325 MG PO TABS: 1.0000 | ORAL_TABLET | Freq: Three times a day (TID) | ORAL | Status: DC | PRN

## 2014-10-20 NOTE — Discharge Instructions (Signed)
Bloody Stools °Bloody stools means there is blood in your poop (stool). It is a sign that there is a problem somewhere in the digestive system. It is important for your doctor to find the cause of your bleeding, so the problem can be treated.  °HOME CARE °· Only take medicine as told by your doctor. °· Eat foods with fiber (prunes, bran cereals). °· Drink enough fluids to keep your pee (urine) clear or pale yellow. °· Sit in warm water (sitz bath) for 10 to 15 minutes as told by your doctor. °· Know how to take your medicines (enemas, suppositories) if advised by your doctor. °· Watch for signs that you are getting better or getting worse. °GET HELP RIGHT AWAY IF:  °· You are not getting better. °· You start to get better but then get worse again. °· You have new problems. °· You have severe bleeding from the place where poop comes out (rectum) that does not stop. °· You throw up (vomit) blood. °· You feel weak or pass out (faint). °· You have a fever. °MAKE SURE YOU:  °· Understand these instructions. °· Will watch your condition. °· Will get help right away if you are not doing well or get worse. °Document Released: 05/12/2009 Document Revised: 08/16/2011 Document Reviewed: 10/09/2010 °[employer]® Patient Information ©2015 [employer]. This information is not intended to replace advice given to you by your health care provider. Make sure you discuss any questions you have with your health care provider. ° °

## 2014-10-20 NOTE — ED Notes (Signed)
Urinal given

## 2014-10-20 NOTE — ED Provider Notes (Signed)
CSN: [id number]     Arrival date & time 10/20/14  1227 History   First MD Initiated Contact with Patient 10/20/14 1319     Chief Complaint  Patient presents with  . Rectal Bleeding     (Consider location/radiation/quality/duration/timing/severity/associated sxs/prior Treatment) Patient is a 64 y.o. male presenting with hematochezia. The history is provided by the patient.  Rectal Bleeding Quality:  Bright red Amount:  Moderate Duration:  2 days Timing:  Intermittent Progression:  Unchanged Chronicity:  New Context comment:  Recent rubber band ligation of hemorrhoids. Similar prior episodes: no   Relieved by:  Nothing Worsened by:  Nothing tried Ineffective treatments: nsaids, benefiber. Associated symptoms: no abdominal pain, no fever and no vomiting     Past Medical History  Diagnosis Date  . Hypertension   . Dysuria   . UTI (urinary tract infection)   . Tuberculosis     h/o PPD +  . BPH (benign prostatic hyperplasia)   . Anemia     normal Fe, nl B12, nl retic, nl EPO July '13  . Chronic kidney disease     CKD III, obstructive nephropathy  . Elevated PSA, greater than or equal to 20 ng/ml June '13    PSA 107  . Chronic back pain   . Hyperlipidemia 05/29/2014  . Diverticulosis   . Blood transfusion without reported diagnosis   . Renal insufficiency   . Hemorrhoids, internal, with bleeding, prolapse 09/19/2014   Past Surgical History  Procedure Laterality Date  . Splenectomy    . Colonoscopy    . Carpal tunnel release Right   . Hemorrhoid banding     Family History  Problem Relation Age of Onset  . Diabetes Mother   . Stroke Brother   . Hearing loss Brother   . Colon cancer Neg Hx   . Rectal cancer Neg Hx   . Stomach cancer Neg Hx   . Esophageal cancer Neg Hx    History  Substance Use Topics  . Smoking status: Former Smoker    Types: Cigarettes  . Smokeless tobacco: Never Used     Comment: Quit 1981  . Alcohol Use: No     Comment: Quit 2004     Review of Systems  Constitutional: Negative for fever.  HENT: Negative for drooling and rhinorrhea.   Eyes: Negative for pain.  Respiratory: Negative for cough and shortness of breath.   Cardiovascular: Negative for chest pain and leg swelling.  Gastrointestinal: Positive for hematochezia. Negative for nausea, vomiting, abdominal pain and diarrhea.  Genitourinary: Negative for dysuria and hematuria.       Rectal pain, bloody stools  Musculoskeletal: Negative for gait problem and neck pain.  Skin: Negative for color change.  Neurological: Negative for numbness and headaches.  Hematological: Negative for adenopathy.  Psychiatric/Behavioral: Negative for behavioral problems.  All other systems reviewed and are negative.     Allergies  Review of patient's allergies indicates no known allergies.  Home Medications   Prior to Admission medications   Medication Sig Start Date End Date Taking? Authorizing Provider  amLODipine (NORVASC) 5 MG tablet Take 1 tablet (5 mg total) by mouth daily. 05/10/14  Yes Biagio Borg, MD  aspirin 81 MG tablet Take 81 mg by mouth daily.   Yes Historical Provider, MD  finasteride (PROSCAR) 5 MG tablet Take 1 tablet (5 mg total) by mouth daily. 01/18/14  Yes Biagio Borg, MD  gabapentin (NEURONTIN) 300 MG capsule Take 1 capsule (300 mg total) by  mouth 3 (three) times daily. 05/23/14  Yes Marcial Pacas, MD  ibuprofen (ADVIL,MOTRIN) 200 MG tablet Take 400 mg by mouth every 6 (six) hours as needed for headache or moderate pain.   Yes Historical Provider, MD  labetalol (NORMODYNE) 200 MG tablet Take 1 tablet (200 mg total) by mouth every morning. 05/10/14  Yes Biagio Borg, MD  Menthol, Topical Analgesic, (BENGAY EX) Apply 1 application topically 2 (two) times daily.   Yes Historical Provider, MD  Menthol, Topical Analgesic, (ICY HOT EX) Apply 1 application topically 2 (two) times daily.   Yes Historical Provider, MD  oxybutynin (DITROPAN) 5 MG tablet Take 1 tablet  (5 mg total) by mouth daily. Patient taking differently: Take 5 mg by mouth daily as needed for bladder spasms.  05/29/14  Yes Biagio Borg, MD  tamsulosin (FLOMAX) 0.4 MG CAPS capsule Take 0.4 mg by mouth daily.   Yes Historical Provider, MD  atorvastatin (LIPITOR) 20 MG tablet Take 1 tablet (20 mg total) by mouth daily. 05/29/14 05/29/15  Biagio Borg, MD  levofloxacin (LEVAQUIN) 250 MG tablet Take 1 tablet (250 mg total) by mouth daily. Patient not taking: Reported on 10/20/2014 09/25/14   Biagio Borg, MD   BP 137/102 mmHg  Pulse 72  Temp(Src) 97.6 F (36.4 C) (Oral)  Resp 18  SpO2 100% Physical Exam  Constitutional: He is oriented to person, place, and time. He appears well-developed and well-nourished.  HENT:  Head: Normocephalic and atraumatic.  Right Ear: External ear normal.  Left Ear: External ear normal.  Nose: Nose normal.  Mouth/Throat: Oropharynx is clear and moist. No oropharyngeal exudate.  Eyes: Conjunctivae and EOM are normal. Pupils are equal, round, and reactive to light.  Neck: Normal range of motion. Neck supple.  Cardiovascular: Normal rate, regular rhythm, normal heart sounds and intact distal pulses.  Exam reveals no gallop and no friction rub.   No murmur heard. Pulmonary/Chest: Effort normal and breath sounds normal. No respiratory distress. He has no wheezes.  Abdominal: Soft. Bowel sounds are normal. He exhibits no distension. There is no tenderness. There is no rebound and no guarding.  Genitourinary:     Musculoskeletal: Normal range of motion. He exhibits no edema or tenderness.  Neurological: He is alert and oriented to person, place, and time.  Skin: Skin is warm and dry.  Psychiatric: He has a normal mood and affect. His behavior is normal.  Nursing note and vitals reviewed.   ED Course  Procedures (including critical care time) Labs Review Labs Reviewed  COMPREHENSIVE METABOLIC PANEL - Abnormal; Notable for the following:    BUN 23 (*)     Creatinine, Ser 2.09 (*)    Calcium 8.8 (*)    ALT 12 (*)    GFR calc non Af Amer 32 (*)    GFR calc Af Amer 37 (*)    All other components within normal limits  CBC WITH DIFFERENTIAL/PLATELET  URINALYSIS, ROUTINE W REFLEX MICROSCOPIC    Imaging Review No results found.   EKG Interpretation None      MDM   Final diagnoses:  Rectal bleeding  Rectal pain    1:42 PM 64 y.o. male w hx of HTN, CKD, HLP, hemorrhoids who pw rectal pain/bleeding. He is status post rubber band ligation of 2 hemorrhoids on 10/17/2014 by Dr.Gessner. He states that he has had some rectal pain since that time. He had a hard stool resulting in a bloody bowel movement yesterday and today. He presents  now due to the ongoing bloody bowel movements. He has been taking Benefiber without significant softening of stools.he is afebrile and vital signs are unremarkable here. Rectal exam shows noninflamed external hemorrhoid at with mild pain during rectal exam. Some mild dark red seen mixed w/ brown stool on my exam. We'll get screening lab work. Likely plan on pain control and additional stool softeners.  3:04 PM: Pain improved. I interpreted/reviewed the labs and/or imaging which were non-contributory.  While narcotics will worsen constipation I think it is reasonable to provide him a small Rx as long as he uses them sparingly and concurrently starts Miralax in addition to the [place] he is already taking. I have discussed the diagnosis/risks/treatment options with the patient and believe the pt to be eligible for discharge home to follow-up with Dr. Carlean Purl as needed. We also discussed returning to the ED immediately if new or worsening sx occur. We discussed the sx which are most concerning (e.g., worsening bloody stools, worsening rectal pain, fever) that necessitate immediate return. Medications administered to the patient during their visit and any new prescriptions provided to the patient are listed  below.  Medications given during this visit Medications  HYDROmorphone (DILAUDID) injection 0.5 mg (0.5 mg Intramuscular Given 10/20/14 1354)    New Prescriptions   OXYCODONE-ACETAMINOPHEN (PERCOCET) 5-325 MG PER TABLET    Take 1 tablet by mouth every 8 (eight) hours as needed for moderate pain.   POLYETHYLENE GLYCOL (MIRALAX / GLYCOLAX) PACKET    Take 17 g by mouth daily as needed for mild constipation.     Pamella Pert, MD 10/20/14 [phone]

## 2014-10-20 NOTE — ED Notes (Signed)
Pt states that he had hemorrhoids removed on Thursday. Starting yesterday pt had rectal bleeding with stools.  Pt states that stools are very hard and has to strain to get them out. Pt states that the blood was red.  Pt was told to come into ED if continuing.

## 2014-11-21 ENCOUNTER — Ambulatory Visit: Payer: Medicare Other | Admitting: Nurse Practitioner

## 2014-11-22 ENCOUNTER — Encounter: Payer: Self-pay | Admitting: Internal Medicine

## 2014-11-22 ENCOUNTER — Ambulatory Visit (INDEPENDENT_AMBULATORY_CARE_PROVIDER_SITE_OTHER): Payer: Medicare Other | Admitting: Internal Medicine

## 2014-11-22 VITALS — BP 128/88 | HR 63 | Temp 97.7°F | Ht 73.0 in | Wt 256.0 lb

## 2014-11-22 DIAGNOSIS — E785 Hyperlipidemia, unspecified: Secondary | ICD-10-CM | POA: Diagnosis not present

## 2014-11-22 DIAGNOSIS — J019 Acute sinusitis, unspecified: Secondary | ICD-10-CM

## 2014-11-22 DIAGNOSIS — I10 Essential (primary) hypertension: Secondary | ICD-10-CM

## 2014-11-22 MED ORDER — AZITHROMYCIN 250 MG PO TABS: ORAL_TABLET | ORAL | Status: DC

## 2014-11-22 NOTE — Assessment & Plan Note (Signed)
With myalgias, ok to try Lipitor qod,  to f/u any worsening symptoms or concerns

## 2014-11-22 NOTE — Assessment & Plan Note (Signed)
stable overall by history and exam, recent data reviewed with pt, and pt to continue medical treatment as before,  to f/u any worsening symptoms or concerns BP Readings from Last 3 Encounters:  11/22/14 128/88  10/20/14 150/91  10/17/14 116/80

## 2014-11-22 NOTE — Assessment & Plan Note (Signed)
Mild to mod, for antibx course,  to f/u any worsening symptoms or concerns 

## 2014-11-22 NOTE — Patient Instructions (Addendum)
Please take all new medication as prescribed - the antibiotic  Ok to try taking the Lipitor every other day  Please continue all other medications as before, and refills have been done if requested.  Please have the pharmacy call with any other refills you may need.  Please keep your appointments with your specialists as you may have planned

## 2014-11-22 NOTE — Progress Notes (Signed)
Subjective:    Patient ID: [name], male    DOB: [birth year], 64 y.o.   MRN: [id number]  HPI   Here with 2-3 days acute onset fever, facial pain, pressure, headache, general weakness and malaise, and greenish d/c, with mild ST and cough, but pt denies chest pain, wheezing, increased sob or doe, orthopnea, PND, increased LE swelling, palpitations, dizziness or syncope.  Has a knot to right elbow with pain in the past wk, now improved last 2 days.  Pt continues to have recurring LBP without change in severity, bowel or bladder change, fever, wt loss,  worsening LE pain/numbness/weakness, gait change or falls.  Also with myalgias though with taking the lipitor daily. Past Medical History  Diagnosis Date  . Hypertension   . Dysuria   . UTI (urinary tract infection)   . Tuberculosis     h/o PPD +  . BPH (benign prostatic hyperplasia)   . Anemia     normal Fe, nl B12, nl retic, nl EPO July '13  . Chronic kidney disease     CKD III, obstructive nephropathy  . Elevated PSA, greater than or equal to 20 ng/ml June '13    PSA 107  . Chronic back pain   . Hyperlipidemia 05/29/2014  . Diverticulosis   . Blood transfusion without reported diagnosis   . Renal insufficiency   . Hemorrhoids, internal, with bleeding, prolapse 09/19/2014   Past Surgical History  Procedure Laterality Date  . Splenectomy    . Colonoscopy    . Carpal tunnel release Right   . Hemorrhoid banding      reports that he has quit smoking. His smoking use included Cigarettes. He has never used smokeless tobacco. He reports that he does not drink alcohol or use illicit drugs. family history includes Diabetes in his mother; Hearing loss in his brother; Stroke in his brother. There is no history of Colon cancer, Rectal cancer, Stomach cancer, or Esophageal cancer. No Known Allergies Current Outpatient Prescriptions on File Prior to Visit  Medication Sig Dispense Refill  . amLODipine (NORVASC) 5 MG tablet Take 1 tablet (5  mg total) by mouth daily. 90 tablet 3  . aspirin 81 MG tablet Take 81 mg by mouth daily.    . finasteride (PROSCAR) 5 MG tablet Take 1 tablet (5 mg total) by mouth daily. 90 tablet 2  . gabapentin (NEURONTIN) 300 MG capsule Take 1 capsule (300 mg total) by mouth 3 (three) times daily. 90 capsule 11  . ibuprofen (ADVIL,MOTRIN) 200 MG tablet Take 400 mg by mouth every 6 (six) hours as needed for headache or moderate pain.    Marland Kitchen labetalol (NORMODYNE) 200 MG tablet Take 1 tablet (200 mg total) by mouth every morning. 90 tablet 3  . Menthol, Topical Analgesic, (BENGAY EX) Apply 1 application topically 2 (two) times daily.    . Menthol, Topical Analgesic, (ICY HOT EX) Apply 1 application topically 2 (two) times daily.    Marland Kitchen oxybutynin (DITROPAN) 5 MG tablet Take 1 tablet (5 mg total) by mouth daily. (Patient taking differently: Take 5 mg by mouth daily as needed for bladder spasms. ) 90 tablet 3  . oxyCODONE-acetaminophen (PERCOCET) 5-325 MG per tablet Take 1 tablet by mouth every 8 (eight) hours as needed for moderate pain. 10 tablet 0  . polyethylene glycol (MIRALAX / GLYCOLAX) packet Take 17 g by mouth daily as needed for mild constipation. 14 each 0  . tamsulosin (FLOMAX) 0.4 MG CAPS capsule Take 0.4 mg by  mouth daily.    Marland Kitchen atorvastatin (LIPITOR) 20 MG tablet Take 1 tablet (20 mg total) by mouth daily. (Patient not taking: Reported on 11/22/2014) 90 tablet 3  . levofloxacin (LEVAQUIN) 250 MG tablet Take 1 tablet (250 mg total) by mouth daily. (Patient not taking: Reported on 10/20/2014) 10 tablet 0   No current facility-administered medications on file prior to visit.   Review of Systems  Constitutional: Negative for unusual diaphoresis or night sweats HENT: Negative for ringing in ear or discharge Eyes: Negative for double vision or worsening visual disturbance.  Respiratory: Negative for choking and stridor.   Gastrointestinal: Negative for vomiting or other signifcant bowel change Genitourinary:  Negative for hematuria or change in urine volume.  Musculoskeletal: Negative for other MSK pain or swelling Skin: Negative for color change and worsening wound.  Neurological: Negative for tremors and numbness other than noted  Psychiatric/Behavioral: Negative for decreased concentration or agitation other than above       Objective:   Physical Exam BP 128/88 mmHg  Pulse 63  Temp(Src) 97.7 F (36.5 C) (Oral)  Ht 6\' 1"  (1.854 m)  Wt 256 lb (116.121 kg)  BMI 33.78 kg/m2  SpO2 97% VS noted,  Constitutional: Pt appears in no significant distress HENT: Head: NCAT.  Right Ear: External ear normal.  Left Ear: External ear normal.  Bilat tm's with mild erythema.  Max sinus areas mild tender.  Pharynx with mild erythema, no exudate Eyes: . Pupils are equal, round, and reactive to light. Conjunctivae and EOM are normal Neck: Normal range of motion. Neck supple.  Cardiovascular: Normal rate and regular rhythm.   Pulmonary/Chest: Effort normal and breath sounds without rales or wheezing.  Abd:  Soft, NT, ND, + BS Spine nontender Right elbow with mild nontender olec bursa swelling, no redness or drainage Neurological: Pt is alert. Not confused , motor grossly intact Skin: Skin is warm. No rash, no LE edema Psychiatric: Pt behavior is normal. No agitation.      Assessment & Plan:

## 2014-11-22 NOTE — Progress Notes (Signed)
Pre visit review using our clinic review tool, if applicable. No additional management support is needed unless otherwise documented below in the visit note. 

## 2014-11-28 ENCOUNTER — Telehealth: Payer: Self-pay | Admitting: Internal Medicine

## 2014-11-28 ENCOUNTER — Ambulatory Visit: Payer: Medicare Other | Admitting: Internal Medicine

## 2014-11-28 MED ORDER — PREDNISONE 10 MG PO TABS: ORAL_TABLET | ORAL | Status: DC

## 2014-11-28 NOTE — Telephone Encounter (Signed)
Notified pt md sent rx to pharmacy.../lmb 

## 2014-11-28 NOTE — Telephone Encounter (Signed)
Patient is calling to advise that he believes he is experiencing gout. He states that he brought this to you on the 17th, and it appears to be nothing when you looked. He states that he is swollen and in pain. Does the patient need an OV for this or can we call something in, given that you looked before?

## 2014-11-28 NOTE — Telephone Encounter (Signed)
Ok for prednisone - done erx 

## 2014-12-16 ENCOUNTER — Encounter (HOSPITAL_COMMUNITY): Payer: Self-pay | Admitting: Emergency Medicine

## 2014-12-16 ENCOUNTER — Emergency Department (INDEPENDENT_AMBULATORY_CARE_PROVIDER_SITE_OTHER): Payer: Medicare Other

## 2014-12-16 ENCOUNTER — Emergency Department (INDEPENDENT_AMBULATORY_CARE_PROVIDER_SITE_OTHER)
Admission: EM | Admit: 2014-12-16 | Discharge: 2014-12-16 | Disposition: A | Discharge status: Home / Self Care | Payer: Medicare Other | Source: Home / Self Care | Attending: Family Medicine | Admitting: Family Medicine

## 2014-12-16 DIAGNOSIS — M79671 Pain in right foot: Secondary | ICD-10-CM | POA: Diagnosis not present

## 2014-12-16 MED ORDER — NAPROXEN 500 MG PO TABS: 500.0000 mg | ORAL_TABLET | Freq: Two times a day (BID) | ORAL | Status: DC

## 2014-12-16 NOTE — Discharge Instructions (Signed)
Wear shoe and use ice and medicine as needed for pain, see your doctor if further problems.

## 2014-12-16 NOTE — ED Provider Notes (Signed)
CSN: [id number]     Arrival date & time 12/16/14  1301 History   First MD Initiated Contact with Patient 12/16/14 1317     Chief Complaint  Patient presents with  . Foot Pain  . Knee Pain   (Consider location/radiation/quality/duration/timing/severity/associated sxs/prior Treatment) Patient is a 64 y.o. male presenting with lower extremity pain. The history is provided by the patient.  Foot Pain This is a recurrent problem. The current episode started more than 2 days ago. The problem has been gradually worsening. Associated symptoms comments: NKI, s/p prednisone without relief.. The symptoms are aggravated by walking.    Past Medical History  Diagnosis Date  . Hypertension   . Dysuria   . UTI (urinary tract infection)   . Tuberculosis     h/o PPD +  . BPH (benign prostatic hyperplasia)   . Anemia     normal Fe, nl B12, nl retic, nl EPO July '13  . Chronic kidney disease     CKD III, obstructive nephropathy  . Elevated PSA, greater than or equal to 20 ng/ml June '13    PSA 107  . Chronic back pain   . Hyperlipidemia 05/29/2014  . Diverticulosis   . Blood transfusion without reported diagnosis   . Renal insufficiency   . Hemorrhoids, internal, with bleeding, prolapse 09/19/2014   Past Surgical History  Procedure Laterality Date  . Splenectomy    . Colonoscopy    . Carpal tunnel release Right   . Hemorrhoid banding     Family History  Problem Relation Age of Onset  . Diabetes Mother   . Stroke Brother   . Hearing loss Brother   . Colon cancer Neg Hx   . Rectal cancer Neg Hx   . Stomach cancer Neg Hx   . Esophageal cancer Neg Hx    History  Substance Use Topics  . Smoking status: Former Smoker    Types: Cigarettes  . Smokeless tobacco: Never Used     Comment: Quit 1981  . Alcohol Use: No     Comment: Quit 2004    Review of Systems  Constitutional: Negative.   Musculoskeletal: Positive for gait problem. Negative for joint swelling.  Skin: Negative.      Allergies  Review of patient's allergies indicates no known allergies.  Home Medications   Prior to Admission medications   Medication Sig Start Date End Date Taking? Authorizing Provider  amLODipine (NORVASC) 5 MG tablet Take 1 tablet (5 mg total) by mouth daily. 05/10/14  Yes Biagio Borg, MD  aspirin 81 MG tablet Take 81 mg by mouth daily.   Yes Historical Provider, MD  atorvastatin (LIPITOR) 20 MG tablet Take 1 tablet (20 mg total) by mouth daily. 05/29/14 05/29/15 Yes Biagio Borg, MD  finasteride (PROSCAR) 5 MG tablet Take 1 tablet (5 mg total) by mouth daily. 01/18/14  Yes Biagio Borg, MD  gabapentin (NEURONTIN) 300 MG capsule Take 1 capsule (300 mg total) by mouth 3 (three) times daily. 05/23/14  Yes Marcial Pacas, MD  labetalol (NORMODYNE) 200 MG tablet Take 1 tablet (200 mg total) by mouth every morning. 05/10/14  Yes Biagio Borg, MD  Menthol, Topical Analgesic, (BENGAY EX) Apply 1 application topically 2 (two) times daily.   Yes Historical Provider, MD  Menthol, Topical Analgesic, (ICY HOT EX) Apply 1 application topically 2 (two) times daily.   Yes Historical Provider, MD  oxybutynin (DITROPAN) 5 MG tablet Take 1 tablet (5 mg total) by mouth daily.  Patient taking differently: Take 5 mg by mouth daily as needed for bladder spasms.  05/29/14  Yes Biagio Borg, MD  polyethylene glycol [facility] / Floria Raveling) packet Take 17 g by mouth daily as needed for mild constipation. 10/20/14  Yes Pamella Pert, MD  tamsulosin (FLOMAX) 0.4 MG CAPS capsule Take 0.4 mg by mouth daily.   Yes Historical Provider, MD  azithromycin (ZITHROMAX Z-PAK) 250 MG tablet Use as directed 11/22/14   Biagio Borg, MD  ibuprofen (ADVIL,MOTRIN) 200 MG tablet Take 400 mg by mouth every 6 (six) hours as needed for headache or moderate pain.    Historical Provider, MD  levofloxacin (LEVAQUIN) 250 MG tablet Take 1 tablet (250 mg total) by mouth daily. Patient not taking: Reported on 10/20/2014 09/25/14   Biagio Borg, MD   naproxen (NAPROSYN) 500 MG tablet Take 1 tablet (500 mg total) by mouth 2 (two) times daily. 12/16/14   Billy Fischer, MD  oxyCODONE-acetaminophen (PERCOCET) 5-325 MG per tablet Take 1 tablet by mouth every 8 (eight) hours as needed for moderate pain. 10/20/14   Pamella Pert, MD  predniSONE (DELTASONE) 10 MG tablet 2 tabs by mouth per day for 5 days 11/28/14   Biagio Borg, MD   BP 139/90 mmHg  Pulse 66  Temp(Src) 98.2 F (36.8 C) (Oral)  Resp 16  SpO2 99% Physical Exam  Constitutional: He is oriented to person, place, and time. He appears well-developed and well-nourished.  Musculoskeletal: He exhibits tenderness.       Right foot: There is tenderness. There is normal range of motion, no bony tenderness, no swelling and no deformity.       Feet:  Neurological: He is alert and oriented to person, place, and time.  Skin: Skin is warm and dry.  Nursing note and vitals reviewed.   ED Course  Procedures (including critical care time) Labs Review Labs Reviewed - No data to display  Imaging Review Dg Foot Complete Right  12/16/2014   CLINICAL DATA:  Right lateral foot pain for 3 months.  EXAM: RIGHT FOOT COMPLETE - 3+ VIEW  COMPARISON:  09/04/2014  FINDINGS: There is no fracture or dislocation. Appeared dorsal degenerative osteophytes in the midfoot and on the calcaneus. 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  IMPRESSION: No acute abnormality. Degenerative changes, stable since the prior study.   Electronically Signed   By: Lorriane Shire M.D.   On: 12/16/2014 14:00   X-rays reviewed and report per radiologist.   MDM   1. Right foot pain        Billy Fischer, MD 12/16/14 1409

## 2014-12-16 NOTE — ED Notes (Signed)
Pt is having intermittent pain in his right foot and knee.  He states he was given prednisone recently for the pain due to possible gout, but the pain has persisted.  He is also worried about the swelling in his left jaw that has been there for over ten years and a bump on his right elbow.

## 2015-01-08 ENCOUNTER — Other Ambulatory Visit: Payer: Self-pay | Admitting: Internal Medicine

## 2015-01-30 ENCOUNTER — Telehealth: Payer: Self-pay

## 2015-01-30 MED ORDER — NAPROXEN 500 MG PO TABS: 500.0000 mg | ORAL_TABLET | Freq: Two times a day (BID) | ORAL | Status: DC

## 2015-01-30 NOTE — Telephone Encounter (Signed)
Patient came in to office and requested naprosyn 500mg  tab to be refilled, i have sent rx to [facility] on [address] per patient request---tried to call patient on cell phone [phone], no answer, no way to leave message

## 2015-03-13 ENCOUNTER — Ambulatory Visit (INDEPENDENT_AMBULATORY_CARE_PROVIDER_SITE_OTHER): Payer: Medicare Other

## 2015-03-13 DIAGNOSIS — Z23 Encounter for immunization: Secondary | ICD-10-CM

## 2015-04-16 ENCOUNTER — Other Ambulatory Visit: Payer: Self-pay

## 2015-04-16 MED ORDER — LABETALOL HCL 200 MG PO TABS: 200.0000 mg | ORAL_TABLET | Freq: Every morning | ORAL | Status: DC

## 2015-04-28 ENCOUNTER — Other Ambulatory Visit: Payer: Self-pay | Admitting: Internal Medicine

## 2015-05-17 ENCOUNTER — Other Ambulatory Visit: Payer: Self-pay | Admitting: Internal Medicine

## 2015-05-18 ENCOUNTER — Emergency Department (HOSPITAL_COMMUNITY)
Admission: EM | Admit: 2015-05-18 | Discharge: 2015-05-18 | Disposition: A | Discharge status: Home / Self Care | Payer: Medicare Other | Attending: Emergency Medicine | Admitting: Emergency Medicine

## 2015-05-18 ENCOUNTER — Encounter (HOSPITAL_COMMUNITY): Payer: Self-pay | Admitting: *Deleted

## 2015-05-18 DIAGNOSIS — Z7982 Long term (current) use of aspirin: Secondary | ICD-10-CM | POA: Diagnosis not present

## 2015-05-18 DIAGNOSIS — R39198 Other difficulties with micturition: Secondary | ICD-10-CM | POA: Insufficient documentation

## 2015-05-18 DIAGNOSIS — N400 Enlarged prostate without lower urinary tract symptoms: Secondary | ICD-10-CM | POA: Diagnosis not present

## 2015-05-18 DIAGNOSIS — Z8611 Personal history of tuberculosis: Secondary | ICD-10-CM | POA: Insufficient documentation

## 2015-05-18 DIAGNOSIS — Z87440 Personal history of urinary (tract) infections: Secondary | ICD-10-CM | POA: Diagnosis not present

## 2015-05-18 DIAGNOSIS — Z79899 Other long term (current) drug therapy: Secondary | ICD-10-CM | POA: Insufficient documentation

## 2015-05-18 DIAGNOSIS — I129 Hypertensive chronic kidney disease with stage 1 through stage 4 chronic kidney disease, or unspecified chronic kidney disease: Secondary | ICD-10-CM | POA: Diagnosis not present

## 2015-05-18 DIAGNOSIS — Z87891 Personal history of nicotine dependence: Secondary | ICD-10-CM | POA: Diagnosis not present

## 2015-05-18 DIAGNOSIS — Y658 Other specified misadventures during surgical and medical care: Secondary | ICD-10-CM | POA: Insufficient documentation

## 2015-05-18 DIAGNOSIS — R339 Retention of urine, unspecified: Secondary | ICD-10-CM | POA: Diagnosis not present

## 2015-05-18 DIAGNOSIS — Z862 Personal history of diseases of the blood and blood-forming organs and certain disorders involving the immune mechanism: Secondary | ICD-10-CM | POA: Diagnosis not present

## 2015-05-18 DIAGNOSIS — N183 Chronic kidney disease, stage 3 (moderate): Secondary | ICD-10-CM | POA: Diagnosis not present

## 2015-05-18 DIAGNOSIS — R34 Anuria and oliguria: Secondary | ICD-10-CM | POA: Diagnosis not present

## 2015-05-18 DIAGNOSIS — E785 Hyperlipidemia, unspecified: Secondary | ICD-10-CM | POA: Insufficient documentation

## 2015-05-18 DIAGNOSIS — Z8719 Personal history of other diseases of the digestive system: Secondary | ICD-10-CM | POA: Insufficient documentation

## 2015-05-18 DIAGNOSIS — G8929 Other chronic pain: Secondary | ICD-10-CM | POA: Insufficient documentation

## 2015-05-18 DIAGNOSIS — T83031A Leakage of indwelling urethral catheter, initial encounter: Secondary | ICD-10-CM | POA: Diagnosis present

## 2015-05-18 LAB — URINALYSIS, ROUTINE W REFLEX MICROSCOPIC
Bilirubin Urine: NEGATIVE
Glucose, UA: NEGATIVE mg/dL
Ketones, ur: NEGATIVE mg/dL
Nitrite: NEGATIVE
PH: 7.5 (ref 5.0–8.0)
Protein, ur: NEGATIVE mg/dL
SPECIFIC GRAVITY, URINE: 1.008 (ref 1.005–1.030)

## 2015-05-18 LAB — URINE MICROSCOPIC-ADD ON: RBC / HPF: NONE SEEN RBC/hpf (ref 0–5)

## 2015-05-18 NOTE — ED Notes (Signed)
PT states that his catheter has not been draining since 2300; pt states that the urine is leaking around it some; pt c/o pressure to his bladder; pt states that he has flushed the catheter twice without relief

## 2015-05-18 NOTE — ED Provider Notes (Addendum)
CSN: [id number]     Arrival date & time 05/18/15  0410 History   First MD Initiated Contact with Patient 05/18/15 [phone]     Chief Complaint  Patient presents with  . Catheter Problems      (Consider location/radiation/quality/duration/timing/severity/associated sxs/prior Treatment) HPI Comments: Patient presents to the ER for evaluation of problems with his Foley catheter. Patient has a chronic indwelling Foley catheter. He reports that it has not been draining since 11 PM last night. Patient reports increasing pressure in the area of the bladder and now urine is leaking around catheter. He tried to flush at home but will not drain.   Past Medical History  Diagnosis Date  . Hypertension   . Dysuria   . UTI (urinary tract infection)   . Tuberculosis     h/o PPD +  . BPH (benign prostatic hyperplasia)   . Anemia     normal Fe, nl B12, nl retic, nl EPO July '13  . Chronic kidney disease     CKD III, obstructive nephropathy  . Elevated PSA, greater than or equal to 20 ng/ml June '13    PSA 107  . Chronic back pain   . Hyperlipidemia 05/29/2014  . Diverticulosis   . Blood transfusion without reported diagnosis   . Renal insufficiency   . Hemorrhoids, internal, with bleeding, prolapse 09/19/2014   Past Surgical History  Procedure Laterality Date  . Splenectomy    . Colonoscopy    . Carpal tunnel release Right   . Hemorrhoid banding     Family History  Problem Relation Age of Onset  . Diabetes Mother   . Stroke Brother   . Hearing loss Brother   . Colon cancer Neg Hx   . Rectal cancer Neg Hx   . Stomach cancer Neg Hx   . Esophageal cancer Neg Hx    Social History  Substance Use Topics  . Smoking status: Former Smoker    Types: Cigarettes  . Smokeless tobacco: Never Used     Comment: Quit 1981  . Alcohol Use: No     Comment: Quit 2004    Review of Systems  Genitourinary: Positive for decreased urine volume and difficulty urinating.  All other systems reviewed and  are negative.     Allergies  Review of patient's allergies indicates no known allergies.  Home Medications   Prior to Admission medications   Medication Sig Start Date End Date Taking? Authorizing Provider  amLODipine (NORVASC) 5 MG tablet Take 1 tablet (5 mg total) by mouth daily. 05/10/14   Biagio Borg, MD  aspirin 81 MG tablet Take 81 mg by mouth daily.    Historical Provider, MD  atorvastatin (LIPITOR) 20 MG tablet Take 1 tablet (20 mg total) by mouth daily. 05/29/14 05/29/15  Biagio Borg, MD  azithromycin (ZITHROMAX Z-PAK) 250 MG tablet Use as directed 11/22/14   Biagio Borg, MD  finasteride (PROSCAR) 5 MG tablet TAKE ONE TABLET BY MOUTH ONCE DAILY 01/09/15   Biagio Borg, MD  gabapentin (NEURONTIN) 300 MG capsule Take 1 capsule (300 mg total) by mouth 3 (three) times daily. 05/23/14   Marcial Pacas, MD  ibuprofen (ADVIL,MOTRIN) 200 MG tablet Take 400 mg by mouth every 6 (six) hours as needed for headache or moderate pain.    Historical Provider, MD  labetalol (NORMODYNE) 200 MG tablet Take 1 tablet (200 mg total) by mouth every morning. 04/16/15   Biagio Borg, MD  levofloxacin (LEVAQUIN) 250 MG tablet  Take 1 tablet (250 mg total) by mouth daily. Patient not taking: Reported on 10/20/2014 09/25/14   Biagio Borg, MD  Menthol, Topical Analgesic, (BENGAY EX) Apply 1 application topically 2 (two) times daily.    Historical Provider, MD  Menthol, Topical Analgesic, (ICY HOT EX) Apply 1 application topically 2 (two) times daily.    Historical Provider, MD  naproxen (NAPROSYN) 500 MG tablet TAKE ONE TABLET BY MOUTH TWICE DAILY 04/29/15   Biagio Borg, MD  oxybutynin (DITROPAN) 5 MG tablet Take 1 tablet (5 mg total) by mouth daily. Patient taking differently: Take 5 mg by mouth daily as needed for bladder spasms.  05/29/14   Biagio Borg, MD  oxyCODONE-acetaminophen (PERCOCET) 5-325 MG per tablet Take 1 tablet by mouth every 8 (eight) hours as needed for moderate pain. 10/20/14   Pamella Pert,  MD  polyethylene glycol (MIRALAX / Floria Raveling) packet Take 17 g by mouth daily as needed for mild constipation. 10/20/14   Pamella Pert, MD  predniSONE (DELTASONE) 10 MG tablet 2 tabs by mouth per day for 5 days 11/28/14   Biagio Borg, MD  tamsulosin (FLOMAX) 0.4 MG CAPS capsule Take 0.4 mg by mouth daily.    Historical Provider, MD   BP 147/95 mmHg  Pulse 72  Temp(Src) 97.5 F (36.4 C) (Oral)  Resp 20  SpO2 98% Physical Exam  Constitutional: He is oriented to person, place, and time. He appears well-developed and well-nourished. No distress.  HENT:  Head: Normocephalic and atraumatic.  Right Ear: Hearing normal.  Left Ear: Hearing normal.  Nose: Nose normal.  Mouth/Throat: Oropharynx is clear and moist and mucous membranes are normal.  Eyes: Conjunctivae and EOM are normal. Pupils are equal, round, and reactive to light.  Neck: Normal range of motion. Neck supple.  Cardiovascular: Regular rhythm, S1 normal and S2 normal.  Exam reveals no gallop and no friction rub.   No murmur heard. Pulmonary/Chest: Effort normal and breath sounds normal. No respiratory distress. He exhibits no tenderness.  Abdominal: Soft. Normal appearance and bowel sounds are normal. There is no hepatosplenomegaly. There is tenderness in the suprapubic area. There is no rebound, no guarding, no tenderness at McBurney's point and negative Murphy's sign. No hernia.  Musculoskeletal: Normal range of motion.  Neurological: He is alert and oriented to person, place, and time. He has normal strength. No cranial nerve deficit or sensory deficit. Coordination normal. GCS eye subscore is 4. GCS verbal subscore is 5. GCS motor subscore is 6.  Skin: Skin is warm, dry and intact. No rash noted. No cyanosis.  Psychiatric: He has a normal mood and affect. His speech is normal and behavior is normal. Thought content normal.  Nursing note and vitals reviewed.   ED Course  Procedures (including critical care time) Labs  Review Labs Reviewed  URINALYSIS, ROUTINE W REFLEX MICROSCOPIC (NOT AT [facility])    Imaging Review No results found. I have personally reviewed and evaluated these images and lab results as part of my medical decision-making.   EKG Interpretation None      MDM   Final diagnoses:  None   urinary retention  Patient with chronic indwelling Foley catheter presents with increased pelvic discomfort and no urine draining into his catheter since 11 PM last night. Patient reports that urine is now draining around the catheter, consistent with blockage. Foley catheter replaced by nursing staff.  Urinalysis is grossly abnormal. Reviewing the patient's previous urinalysis results reveals that it has always been similar.  This includes cultures that have not grown out specific pathogens in the past. Will reculture, no treatment necessary.  Orpah Greek, MD 05/18/15 [place], MD 05/18/15 [phone]

## 2015-05-18 NOTE — Discharge Instructions (Signed)
Acute Urinary Retention, Male °Acute urinary retention is the temporary inability to urinate. °This is a common problem in older men. As men age their prostates become larger and block the flow of urine from the bladder. This is usually a problem that has come on gradually.  °HOME CARE INSTRUCTIONS °If you are sent home with a Foley catheter and a drainage system, you will need to discuss the best course of action with your health care provider. While the catheter is in, maintain a good intake of fluids. Keep the drainage bag emptied and lower than your catheter. This is so that contaminated urine will not flow back into your bladder, which could lead to a urinary tract infection. °There are two main types of drainage bags. One is a large bag that usually is used at night. It has a good capacity that will allow you to sleep through the night without having to empty it. The second type is called a leg bag. It has a smaller capacity, so it needs to be emptied more frequently. However, the main advantage is that it can be attached by a leg strap and can go underneath your clothing, allowing you the freedom to move about or leave your home. °Only take over-the-counter or prescription medicines for pain, discomfort, or fever as directed by your health care provider.  °SEEK MEDICAL CARE IF: °· You develop a low-grade fever. °· You experience spasms or leakage of urine with the spasms. °SEEK IMMEDIATE MEDICAL CARE IF:  °· You develop chills or fever. °· Your catheter stops draining urine. °· Your catheter falls out. °· You start to develop increased bleeding that does not respond to rest and increased fluid intake. °MAKE SURE YOU: °· Understand these instructions. °· Will watch your condition. °· Will get help right away if you are not doing well or get worse. °  °This information is not intended to replace advice given to you by your health care provider. Make sure you discuss any questions you have with your health care  provider. °  °Document Released: 08/30/2000 Document Revised: 10/08/2014 Document Reviewed: 11/02/2012 °Elsevier Interactive Patient Education ©2016 [employer] ° °

## 2015-05-21 LAB — URINE CULTURE

## 2015-05-22 ENCOUNTER — Telehealth (HOSPITAL_BASED_OUTPATIENT_CLINIC_OR_DEPARTMENT_OTHER): Payer: Self-pay | Admitting: Emergency Medicine

## 2015-05-22 NOTE — Telephone Encounter (Signed)
Post ED Visit - Positive Culture Follow-up  Culture report reviewed by antimicrobial stewardship pharmacist:  [x]  Elenor Quinones, Pharm.D. []  Heide Guile, Pharm.D., BCPS []  Parks Neptune, Pharm.D. []  Alycia Rossetti, Pharm.D., BCPS []  [place], Pharm.D., BCPS, AAHIVP []  Legrand Como, Pharm.D., BCPS, AAHIVP []  Milus Glazier, Pharm.D. []  Stephens November, Pharm.D.  Positive urine culture  Treated with Proteus, Serratia, organism sensitive to the same and no further patient follow-up is required at this time.  Hazle Nordmann 05/22/2015, 9:39 AM

## 2015-05-22 NOTE — Progress Notes (Signed)
ED Antimicrobial Stewardship Positive Culture Follow Up   [name] is an 64 y.o. male who presented to [facility] on 05/18/2015 with a chief complaint of  Chief Complaint  Patient presents with  . Catheter Problems     Recent Results (from the past 720 hour(s))  Urine culture     Status: None   Collection Time: 05/18/15  4:47 AM  Result Value Ref Range Status   Specimen Description URINE, CATHETERIZED  Final   Special Requests NONE  Final   Culture   Final    >=100,000 COLONIES/mL SERRATIA MARCESCENS >=100,000 COLONIES/mL PROTEUS MIRABILIS Performed at [facility]    Report Status 05/21/2015 FINAL  Final   Organism ID, Bacteria SERRATIA MARCESCENS  Final   Organism ID, Bacteria PROTEUS MIRABILIS  Final      Susceptibility   Proteus mirabilis - MIC*    AMPICILLIN <=2 SENSITIVE Sensitive     CEFAZOLIN <=4 SENSITIVE Sensitive     CEFTRIAXONE <=1 SENSITIVE Sensitive     CIPROFLOXACIN <=0.25 SENSITIVE Sensitive     GENTAMICIN <=1 SENSITIVE Sensitive     IMIPENEM 1 SENSITIVE Sensitive     NITROFURANTOIN 128 RESISTANT Resistant     TRIMETH/SULFA <=20 SENSITIVE Sensitive     AMPICILLIN/SULBACTAM <=2 SENSITIVE Sensitive     PIP/TAZO <=4 SENSITIVE Sensitive     * >=100,000 COLONIES/mL PROTEUS MIRABILIS   Serratia marcescens - MIC*    CEFAZOLIN >=64 RESISTANT Resistant     CEFTRIAXONE <=1 SENSITIVE Sensitive     CIPROFLOXACIN <=0.25 SENSITIVE Sensitive     GENTAMICIN <=1 SENSITIVE Sensitive     NITROFURANTOIN 128 RESISTANT Resistant     TRIMETH/SULFA <=20 SENSITIVE Sensitive     * >=100,000 COLONIES/mL SERRATIA MARCESCENS   Patient has a chronic foley catheter. No real signs or symptoms of UTI. Seems to have been more of a mechanical blockage than an infection. New catheter placed. Will consider colonization.  No treatment necessary  ED Provider: Margarita Mail PA-C   Reginia Naas 05/22/2015, 9:14 AM Infectious Diseases Pharmacist Phone#  [phone]

## 2015-05-23 ENCOUNTER — Emergency Department (HOSPITAL_COMMUNITY)
Admission: EM | Admit: 2015-05-23 | Discharge: 2015-05-23 | Disposition: A | Discharge status: Home / Self Care | Payer: Medicare Other | Attending: Emergency Medicine | Admitting: Emergency Medicine

## 2015-05-23 ENCOUNTER — Encounter (HOSPITAL_COMMUNITY): Payer: Self-pay | Admitting: *Deleted

## 2015-05-23 DIAGNOSIS — Z8639 Personal history of other endocrine, nutritional and metabolic disease: Secondary | ICD-10-CM | POA: Diagnosis not present

## 2015-05-23 DIAGNOSIS — Z7982 Long term (current) use of aspirin: Secondary | ICD-10-CM | POA: Insufficient documentation

## 2015-05-23 DIAGNOSIS — G8929 Other chronic pain: Secondary | ICD-10-CM | POA: Diagnosis not present

## 2015-05-23 DIAGNOSIS — Z87891 Personal history of nicotine dependence: Secondary | ICD-10-CM | POA: Insufficient documentation

## 2015-05-23 DIAGNOSIS — R319 Hematuria, unspecified: Secondary | ICD-10-CM | POA: Diagnosis not present

## 2015-05-23 DIAGNOSIS — N183 Chronic kidney disease, stage 3 (moderate): Secondary | ICD-10-CM | POA: Diagnosis not present

## 2015-05-23 DIAGNOSIS — Z8719 Personal history of other diseases of the digestive system: Secondary | ICD-10-CM | POA: Insufficient documentation

## 2015-05-23 DIAGNOSIS — D649 Anemia, unspecified: Secondary | ICD-10-CM | POA: Insufficient documentation

## 2015-05-23 DIAGNOSIS — I129 Hypertensive chronic kidney disease with stage 1 through stage 4 chronic kidney disease, or unspecified chronic kidney disease: Secondary | ICD-10-CM | POA: Diagnosis not present

## 2015-05-23 DIAGNOSIS — Z87448 Personal history of other diseases of urinary system: Secondary | ICD-10-CM | POA: Diagnosis not present

## 2015-05-23 DIAGNOSIS — Z87440 Personal history of urinary (tract) infections: Secondary | ICD-10-CM | POA: Diagnosis not present

## 2015-05-23 DIAGNOSIS — Z79899 Other long term (current) drug therapy: Secondary | ICD-10-CM | POA: Insufficient documentation

## 2015-05-23 DIAGNOSIS — N12 Tubulo-interstitial nephritis, not specified as acute or chronic: Secondary | ICD-10-CM | POA: Insufficient documentation

## 2015-05-23 DIAGNOSIS — Z8611 Personal history of tuberculosis: Secondary | ICD-10-CM | POA: Insufficient documentation

## 2015-05-23 DIAGNOSIS — N400 Enlarged prostate without lower urinary tract symptoms: Secondary | ICD-10-CM | POA: Insufficient documentation

## 2015-05-23 DIAGNOSIS — R1084 Generalized abdominal pain: Secondary | ICD-10-CM | POA: Diagnosis not present

## 2015-05-23 LAB — URINALYSIS, ROUTINE W REFLEX MICROSCOPIC
Bilirubin Urine: NEGATIVE
GLUCOSE, UA: NEGATIVE mg/dL
KETONES UR: NEGATIVE mg/dL
Nitrite: NEGATIVE
PROTEIN: 30 mg/dL — AB
Specific Gravity, Urine: 1.008 (ref 1.005–1.030)
pH: 7 (ref 5.0–8.0)

## 2015-05-23 LAB — BASIC METABOLIC PANEL
Anion gap: 12 (ref 5–15)
BUN: 34 mg/dL — AB (ref 6–20)
CALCIUM: 9.3 mg/dL (ref 8.9–10.3)
CO2: 26 mmol/L (ref 22–32)
Chloride: 102 mmol/L (ref 101–111)
Creatinine, Ser: 2.62 mg/dL — ABNORMAL HIGH (ref 0.61–1.24)
GFR calc Af Amer: 28 mL/min — ABNORMAL LOW (ref >60)
GFR, EST NON AFRICAN AMERICAN: 24 mL/min — AB (ref >60)
GLUCOSE: 115 mg/dL — AB (ref 65–99)
Potassium: 3.7 mmol/L (ref 3.5–5.1)
Sodium: 140 mmol/L (ref 135–145)

## 2015-05-23 LAB — DIFFERENTIAL
BASOS ABS: 0 10*3/uL (ref 0.0–0.1)
BASOS PCT: 0 %
EOS ABS: 0.1 10*3/uL (ref 0.0–0.7)
Eosinophils Relative: 1 %
Lymphocytes Relative: 9 %
Lymphs Abs: 1 10*3/uL (ref 0.7–4.0)
MONOS PCT: 6 %
Monocytes Absolute: 0.6 10*3/uL (ref 0.1–1.0)
NEUTROS PCT: 84 %
Neutro Abs: 9 10*3/uL — ABNORMAL HIGH (ref 1.7–7.7)

## 2015-05-23 LAB — CBC
HEMATOCRIT: 42 % (ref 39.0–52.0)
Hemoglobin: 13.9 g/dL (ref 13.0–17.0)
MCH: 29 pg (ref 26.0–34.0)
MCHC: 33.1 g/dL (ref 30.0–36.0)
MCV: 87.5 fL (ref 78.0–100.0)
Platelets: 260 10*3/uL (ref 150–400)
RBC: 4.8 MIL/uL (ref 4.22–5.81)
RDW: 13.8 % (ref 11.5–15.5)
WBC: 10.6 10*3/uL — ABNORMAL HIGH (ref 4.0–10.5)

## 2015-05-23 LAB — URINE MICROSCOPIC-ADD ON: Squamous Epithelial / LPF: NONE SEEN

## 2015-05-23 MED ORDER — ONDANSETRON 4 MG PO TBDP: 4.0000 mg | ORAL_TABLET | Freq: Three times a day (TID) | ORAL | Status: DC | PRN

## 2015-05-23 MED ORDER — ACETAMINOPHEN 325 MG PO TABS
650.0000 mg | ORAL_TABLET | Freq: Once | ORAL | Status: AC
  Administered 2015-05-23: 650 mg via ORAL
  Filled 2015-05-23: qty 2

## 2015-05-23 MED ORDER — HYDROCODONE-ACETAMINOPHEN 5-325 MG PO TABS
1.0000 | ORAL_TABLET | Freq: Once | ORAL | Status: AC
  Administered 2015-05-23: 1 via ORAL
  Filled 2015-05-23: qty 1

## 2015-05-23 MED ORDER — DEXTROSE 5 % IV SOLN
1.0000 g | Freq: Once | INTRAVENOUS | Status: AC
  Administered 2015-05-23: 1 g via INTRAVENOUS
  Filled 2015-05-23: qty 10

## 2015-05-23 MED ORDER — LIDOCAINE HCL 2 % EX GEL
1.0000 "application " | Freq: Once | CUTANEOUS | Status: AC | PRN
  Administered 2015-05-23: 1 via URETHRAL
  Filled 2015-05-23: qty 11

## 2015-05-23 MED ORDER — SODIUM CHLORIDE 0.9 % IV BOLUS (SEPSIS)
1000.0000 mL | Freq: Once | INTRAVENOUS | Status: AC
  Administered 2015-05-23: 1000 mL via INTRAVENOUS

## 2015-05-23 MED ORDER — ACETAMINOPHEN 325 MG PO TABS: 650.0000 mg | ORAL_TABLET | Freq: Four times a day (QID) | ORAL | Status: DC | PRN

## 2015-05-23 MED ORDER — CEPHALEXIN 500 MG PO CAPS: 500.0000 mg | ORAL_CAPSULE | Freq: Two times a day (BID) | ORAL | Status: DC

## 2015-05-23 NOTE — ED Notes (Signed)
MD at bedside. 

## 2015-05-23 NOTE — ED Notes (Signed)
Bed: [id number] Expected date:  Expected time:  Means of arrival:  Comments: EMS 63 yo male blood in urine/chronic foley, low back pain x 2 days, left flank pain, fever 101, tylenol

## 2015-05-23 NOTE — Discharge Instructions (Signed)
You have a kidney infection. Please take the antibiotics prescribed and see your doctor in 1 week.  Please return to the ER if your symptoms worsen; you have increased pain, fevers, chills, inability to keep any medications down, confusion. Otherwise see the outpatient doctor as requested.   Pyelonephritis, Adult Pyelonephritis is a kidney infection. The kidneys are the organs that filter a person's blood and move waste out of the bloodstream and into the urine. Urine passes from the kidneys, through the ureters, and into the bladder. There are two main types of pyelonephritis:  Infections that come on quickly without any warning (acute pyelonephritis).  Infections that last for a long period of time (chronic pyelonephritis). In most cases, the infection clears up with treatment and does not cause further problems. More severe infections or chronic infections can sometimes spread to the bloodstream or lead to other problems with the kidneys. CAUSES This condition is usually caused by:  Bacteria traveling from the bladder to the kidney through infected urine. The urine in the bladder can become infected with bacteria from:  Bladder infection (cystitis).  Inflammation of the prostate gland (prostatitis).  Sexual intercourse, in females.  Bacteria traveling from the bloodstream to the kidney. RISK FACTORS This condition is more likely to develop in:  Pregnant women.  Older people.  People who have diabetes.  People who have kidney stones or bladder stones.  People who have other abnormalities of the kidney or ureter.  People who have a catheter placed in the bladder.  People who have cancer.  People who are sexually active.  Women who use spermicides.  People who have had a prior urinary tract infection. SYMPTOMS Symptoms of this condition include:  Frequent urination.  Strong or persistent urge to urinate.  Burning or stinging when urinating.  Abdominal  pain.  Back pain.  Pain in the side or flank area.  Fever.  Chills.  Blood in the urine, or dark urine.  Nausea.  Vomiting. DIAGNOSIS This condition may be diagnosed based on:  Medical history and physical exam.  Urine tests.  Blood tests. You may also have imaging tests of the kidneys, such as an ultrasound or CT scan. TREATMENT Treatment for this condition may depend on the severity of the infection.  If the infection is mild and is found early, you may be treated with antibiotic medicines taken by mouth. You will need to drink fluids to remain hydrated.  If the infection is more severe, you may need to stay in the hospital and receive antibiotics given directly into a vein through an IV tube. You may also need to receive fluids through an IV tube if you are not able to remain hydrated. After your hospital stay, you may need to take oral antibiotics for a period of time. Other treatments may be required, depending on the cause of the infection. HOME CARE INSTRUCTIONS Medicines  Take over-the-counter and prescription medicines only as told by your health care provider.  If you were prescribed an antibiotic medicine, take it as told by your health care provider. Do not stop taking the antibiotic even if you start to feel better. General Instructions  Drink enough fluid to keep your urine clear or pale yellow.  Avoid caffeine, tea, and carbonated beverages. They tend to irritate the bladder.  Urinate often. Avoid holding in urine for long periods of time.  Urinate before and after sex.  After a bowel movement, women should cleanse from front to back. Use each tissue only  once.  Keep all follow-up visits as told by your health care provider. This is important. SEEK MEDICAL CARE IF:  Your symptoms do not get better after 2 days of treatment.  Your symptoms get worse.  You have a fever. SEEK IMMEDIATE MEDICAL CARE IF:  You are unable to take your antibiotics or  fluids.  You have shaking chills.  You vomit.  You have severe flank or back pain.  You have extreme weakness or fainting.   This information is not intended to replace advice given to you by your health care provider. Make sure you discuss any questions you have with your health care provider.   Document Released: 05/24/2005 Document Revised: 02/12/2015 Document Reviewed: 09/16/2014 Elsevier Interactive Patient Education [employer].

## 2015-05-23 NOTE — ED Provider Notes (Signed)
CSN: [id number]     Arrival date & time 05/23/15  0308 History  By signing my name below, I, Emmanuella Mensah, attest that this documentation has been prepared under the direction and in the presence of Varney Biles, MD. Electronically Signed: Judithann Sauger, ED Scribe. 05/23/2015. 4:07 AM.    Chief Complaint  Patient presents with  . Back Pain  . Hematuria  . Fever   The history is provided by the patient. No language interpreter was used.   HPI Comments: [name] is a 64 y.o. male with a hx of HTN, UTI, and chronic kidney disease who presents to the Emergency Department complaining of gradually worsening lower left back pain and abdominal pain onset yesterday. He reports associated pink hematuria with fever and chills onset several hours PTA. He denies any n/v/d. Pt reports with a foley catheter and states that he has it due to an enlarged prostate. He also reports a hx of UTIs.   He also c/o of gradually improving area of swelling to the right elbow.    Past Medical History  Diagnosis Date  . Hypertension   . Dysuria   . UTI (urinary tract infection)   . Tuberculosis     h/o PPD +  . BPH (benign prostatic hyperplasia)   . Anemia     normal Fe, nl B12, nl retic, nl EPO July '13  . Chronic kidney disease     CKD III, obstructive nephropathy  . Elevated PSA, greater than or equal to 20 ng/ml June '13    PSA 107  . Chronic back pain   . Hyperlipidemia 05/29/2014  . Diverticulosis   . Blood transfusion without reported diagnosis   . Renal insufficiency   . Hemorrhoids, internal, with bleeding, prolapse 09/19/2014   Past Surgical History  Procedure Laterality Date  . Splenectomy    . Colonoscopy    . Carpal tunnel release Right   . Hemorrhoid banding     Family History  Problem Relation Age of Onset  . Diabetes Mother   . Stroke Brother   . Hearing loss Brother   . Colon cancer Neg Hx   . Rectal cancer Neg Hx   . Stomach cancer Neg Hx   . Esophageal cancer  Neg Hx    Social History  Substance Use Topics  . Smoking status: Former Smoker    Types: Cigarettes  . Smokeless tobacco: Never Used     Comment: Quit 1981  . Alcohol Use: No     Comment: Quit 2004    Review of Systems  Constitutional: Positive for fever and chills.  Gastrointestinal: Positive for abdominal pain. Negative for nausea and vomiting.  Genitourinary: Positive for hematuria. Negative for dysuria.  Musculoskeletal: Positive for back pain.      Allergies  Review of patient's allergies indicates no known allergies.  Home Medications   Prior to Admission medications   Medication Sig Start Date End Date Taking? Authorizing Provider  amLODipine (NORVASC) 5 MG tablet TAKE ONE TABLET BY MOUTH ONCE DAILY 05/19/15  Yes Biagio Borg, MD  aspirin 81 MG tablet Take 81 mg by mouth daily.   Yes Historical Provider, MD  finasteride (PROSCAR) 5 MG tablet TAKE ONE TABLET BY MOUTH ONCE DAILY 01/09/15  Yes Biagio Borg, MD  ibuprofen (ADVIL,MOTRIN) 200 MG tablet Take 400 mg by mouth every 6 (six) hours as needed for headache or moderate pain.   Yes Historical Provider, MD  labetalol (NORMODYNE) 200 MG tablet Take 1  tablet (200 mg total) by mouth every morning. 04/16/15  Yes Biagio Borg, MD  oxybutynin (DITROPAN) 5 MG tablet Take 1 tablet (5 mg total) by mouth daily. Patient taking differently: Take 5 mg by mouth daily as needed for bladder spasms.  05/29/14  Yes Biagio Borg, MD  tamsulosin (FLOMAX) 0.4 MG CAPS capsule Take 0.4 mg by mouth daily.   Yes Historical Provider, MD  acetaminophen (TYLENOL) 325 MG tablet Take 2 tablets (650 mg total) by mouth every 6 (six) hours as needed. 05/23/15   Varney Biles, MD  atorvastatin (LIPITOR) 20 MG tablet Take 1 tablet (20 mg total) by mouth daily. Patient not taking: Reported on 05/18/2015 05/29/14 05/29/15  Biagio Borg, MD  cephALEXin (KEFLEX) 500 MG capsule Take 1 capsule (500 mg total) by mouth 2 (two) times daily. 05/23/15   Varney Biles, MD  gabapentin (NEURONTIN) 300 MG capsule Take 1 capsule (300 mg total) by mouth 3 (three) times daily. Patient not taking: Reported on 05/18/2015 05/23/14   Marcial Pacas, MD  ondansetron (ZOFRAN-ODT) 4 MG disintegrating tablet Take 1 tablet (4 mg total) by mouth every 8 (eight) hours as needed for nausea. 05/23/15   Basma Buchner, MD   BP 133/90 mmHg  Pulse 72  Temp(Src) 99.9 F (37.7 C) (Axillary)  Resp 16  SpO2 97% Physical Exam  Constitutional: He is oriented to person, place, and time. He appears well-developed and well-nourished. No distress.  HENT:  Head: Normocephalic and atraumatic.  Eyes: Conjunctivae and EOM are normal.  Neck: Neck supple. No tracheal deviation present.  Cardiovascular: Normal rate and regular rhythm.   Pulmonary/Chest: Effort normal. No respiratory distress.  Abdominal:  Suprapubic and LLQ tenderness.  Positive left flank tenderness  Musculoskeletal: Normal range of motion.  Neurological: He is alert and oriented to person, place, and time.  Skin: Skin is warm and dry.  Psychiatric: He has a normal mood and affect. His behavior is normal.  Nursing note and vitals reviewed.   ED Course  Procedures (including critical care time) DIAGNOSTIC STUDIES: Oxygen Saturation is 94% on RA, adequate by my interpretation.    COORDINATION OF CARE: 3:34 AM- Pt advised of plan for treatment and pt agrees. Pt will receive IV antibiotics and a change of foley bag. Pt will also receive blood work.    Labs Review Labs Reviewed  URINALYSIS, ROUTINE W REFLEX MICROSCOPIC (NOT AT [facility]) - Abnormal; Notable for the following:    APPearance CLOUDY (*)    Hgb urine dipstick LARGE (*)    Protein, ur 30 (*)    Leukocytes, UA LARGE (*)    All other components within normal limits  BASIC METABOLIC PANEL - Abnormal; Notable for the following:    Glucose, Bld 115 (*)    BUN 34 (*)    Creatinine, Ser 2.62 (*)    GFR calc non Af Amer 24 (*)    GFR calc Af Amer 28 (*)     All other components within normal limits  CBC - Abnormal; Notable for the following:    WBC 10.6 (*)    All other components within normal limits  DIFFERENTIAL - Abnormal; Notable for the following:    Neutro Abs 9.0 (*)    All other components within normal limits  URINE MICROSCOPIC-ADD ON - Abnormal; Notable for the following:    Bacteria, UA MANY (*)    All other components within normal limits  URINE CULTURE    Varney Biles, MD has personally  reviewed and evaluated these lab results as part of his medical decision-making.  MDM   Final diagnoses:  Pyelonephritis    I personally performed the services described in this documentation, which was scribed in my presence. The recorded information has been reviewed and is accurate.  Pt comes in with cc of back pain, fevers, chills. Has chronic indwelling foley catheter due to BPH. Pt also has CKD, but he is immunocompetent. Pt's symptoms are consistent with acute pyelonephritis. Pt was seen on 12/11 with some urinary retention, and at that time his urine was sent for cultures. Pt's urine is + for proteus and serratia microbes - both susceptible to ceftriaxone. We will give iv ceftriaxone here. He has a low grade fever, we will check his Cr and his CBC. Lactate not indicated right now. Pt will also get po challenge, and is he passes, he will be discharged.  Varney Biles, MD 05/23/15 [phone]

## 2015-05-23 NOTE — ED Notes (Signed)
Pt arrived via EMS for progressing pain since yesterday, progressing this morning. Tenderness per palpation to left back. He has radiating pain to front left back and distention to L and R lower quandrant.  and fever. Pt EMS not abnormal to have blood in his urine but he notice more blood that normal. He has a h/o of HTN.   Tylenol 1000 mg  Given in route by EMS

## 2015-05-24 LAB — URINE CULTURE: Special Requests: NORMAL

## 2015-05-27 ENCOUNTER — Other Ambulatory Visit (INDEPENDENT_AMBULATORY_CARE_PROVIDER_SITE_OTHER): Payer: Medicare Other

## 2015-05-27 ENCOUNTER — Ambulatory Visit (INDEPENDENT_AMBULATORY_CARE_PROVIDER_SITE_OTHER)
Admission: RE | Admit: 2015-05-27 | Discharge: 2015-05-27 | Disposition: A | Discharge status: Home / Self Care | Payer: Medicare Other | Source: Ambulatory Visit | Attending: Internal Medicine | Admitting: Internal Medicine

## 2015-05-27 ENCOUNTER — Encounter: Payer: Self-pay | Admitting: Internal Medicine

## 2015-05-27 ENCOUNTER — Ambulatory Visit (INDEPENDENT_AMBULATORY_CARE_PROVIDER_SITE_OTHER): Payer: Medicare Other | Admitting: Internal Medicine

## 2015-05-27 VITALS — BP 128/86 | HR 55 | Temp 98.3°F | Ht 73.0 in | Wt 257.0 lb

## 2015-05-27 DIAGNOSIS — N10 Acute tubulo-interstitial nephritis: Secondary | ICD-10-CM | POA: Diagnosis not present

## 2015-05-27 DIAGNOSIS — K5909 Other constipation: Secondary | ICD-10-CM | POA: Insufficient documentation

## 2015-05-27 DIAGNOSIS — K5900 Constipation, unspecified: Secondary | ICD-10-CM

## 2015-05-27 DIAGNOSIS — Z0189 Encounter for other specified special examinations: Secondary | ICD-10-CM

## 2015-05-27 DIAGNOSIS — N179 Acute kidney failure, unspecified: Secondary | ICD-10-CM

## 2015-05-27 DIAGNOSIS — Z0000 Encounter for general adult medical examination without abnormal findings: Secondary | ICD-10-CM

## 2015-05-27 DIAGNOSIS — R079 Chest pain, unspecified: Secondary | ICD-10-CM

## 2015-05-27 DIAGNOSIS — R140 Abdominal distension (gaseous): Secondary | ICD-10-CM

## 2015-05-27 LAB — CBC WITH DIFFERENTIAL/PLATELET
BASOS PCT: 0.5 % (ref 0.0–3.0)
Basophils Absolute: 0 10*3/uL (ref 0.0–0.1)
EOS ABS: 0.3 10*3/uL (ref 0.0–0.7)
EOS PCT: 4.5 % (ref 0.0–5.0)
HCT: 41.3 % (ref 39.0–52.0)
HEMOGLOBIN: 13.6 g/dL (ref 13.0–17.0)
LYMPHS ABS: 2.2 10*3/uL (ref 0.7–4.0)
Lymphocytes Relative: 37.7 % (ref 12.0–46.0)
MCHC: 33 g/dL (ref 30.0–36.0)
MCV: 85.9 fl (ref 78.0–100.0)
MONO ABS: 0.9 10*3/uL (ref 0.1–1.0)
Monocytes Relative: 16.1 % — ABNORMAL HIGH (ref 3.0–12.0)
NEUTROS ABS: 2.4 10*3/uL (ref 1.4–7.7)
NEUTROS PCT: 41.2 % — AB (ref 43.0–77.0)
PLATELETS: 178 10*3/uL (ref 150.0–400.0)
RBC: 4.82 Mil/uL (ref 4.22–5.81)
RDW: 15.1 % (ref 11.5–15.5)
WBC: 5.8 10*3/uL (ref 4.0–10.5)

## 2015-05-27 LAB — URINALYSIS, ROUTINE W REFLEX MICROSCOPIC
Bilirubin Urine: NEGATIVE
Ketones, ur: NEGATIVE
Nitrite: NEGATIVE
PH: 6 (ref 5.0–8.0)
URINE GLUCOSE: NEGATIVE
UROBILINOGEN UA: 0.2 (ref 0.0–1.0)

## 2015-05-27 NOTE — Patient Instructions (Addendum)
Your EKG was [facility] today  Please continue all other medications as before, including the cephalexin  Please have the pharmacy call with any other refills you may need.  Please keep your appointments with your specialists as you may have planned  Please go to the XRAY Department in the Basement (go straight as you get off the elevator) for the x-ray testing  Please go to the LAB in the Basement (turn left off the elevator) for the tests to be done today  You will be contacted by phone if any changes need to be made immediately.  Otherwise, you will receive a letter about your results with an explanation, but please check with MyChart first.  Please remember to sign up for MyChart if you have not done so, as this will be important to you in the future with finding out test results, communicating by private email, and scheduling acute appointments online when needed.  Please return in 6 months, or sooner if needed, with Lab testing done 3-5 days before

## 2015-05-27 NOTE — Progress Notes (Signed)
Pre visit review using our clinic review tool, if applicable. No additional management support is needed unless otherwise documented below in the visit note. 

## 2015-05-27 NOTE — Progress Notes (Signed)
Subjective:    Patient ID: [name], male    DOB: Feb 15, 1951, 64 y.o.   MRN: [id number]  HPI  Here to f/u dec 16 episode acute pyelonephritis with fever chills, which have improved, has taken the cephalexin, Denies urinary symptoms such as dysuria, frequency, urgency, flank pain, hematuria or n/v, but has discomfort at end of penis slightly worse than before, abd bloating with mild worsening consipation (no n/v or worsening pain) and gurgling/bubbling in the abd, also with several days intermittent some mid SSCP mild, + pleuritic o/w nonpositonal, nonexertional, and not assoc with diaphoresis, n/v, sob, palp or dizziness.  Denies worsening reflux, abd pain, dysphagia, or blood.  Also noted worsening AKI at ER visit dec 16 with cr 2.62 (baseline about 2.0-2.2).  Taking po ok.  Urine cx with mult species - not definitive Past Medical History  Diagnosis Date  . Hypertension   . Dysuria   . UTI (urinary tract infection)   . Tuberculosis     h/o PPD +  . BPH (benign prostatic hyperplasia)   . Anemia     normal Fe, nl B12, nl retic, nl EPO July '13  . Chronic kidney disease     CKD III, obstructive nephropathy  . Elevated PSA, greater than or equal to 20 ng/ml June '13    PSA 107  . Chronic back pain   . Hyperlipidemia 05/29/2014  . Diverticulosis   . Blood transfusion without reported diagnosis   . Renal insufficiency   . Hemorrhoids, internal, with bleeding, prolapse 09/19/2014   Past Surgical History  Procedure Laterality Date  . Splenectomy    . Colonoscopy    . Carpal tunnel release Right   . Hemorrhoid banding      reports that he has quit smoking. His smoking use included Cigarettes. He has never used smokeless tobacco. He reports that he does not drink alcohol or use illicit drugs. family history includes Diabetes in his mother; Hearing loss in his brother; Stroke in his brother. There is no history of Colon cancer, Rectal cancer, Stomach cancer, or Esophageal cancer. No  Known Allergies Current Outpatient Prescriptions on File Prior to Visit  Medication Sig Dispense Refill  . acetaminophen (TYLENOL) 325 MG tablet Take 2 tablets (650 mg total) by mouth every 6 (six) hours as needed. 12 tablet 0  . amLODipine (NORVASC) 5 MG tablet TAKE ONE TABLET BY MOUTH ONCE DAILY 90 tablet 1  . aspirin 81 MG tablet Take 81 mg by mouth daily.    . cephALEXin (KEFLEX) 500 MG capsule Take 1 capsule (500 mg total) by mouth 2 (two) times daily. 28 capsule 0  . finasteride (PROSCAR) 5 MG tablet TAKE ONE TABLET BY MOUTH ONCE DAILY 90 tablet 1  . labetalol (NORMODYNE) 200 MG tablet Take 1 tablet (200 mg total) by mouth every morning. 90 tablet 3  . ondansetron (ZOFRAN-ODT) 4 MG disintegrating tablet Take 1 tablet (4 mg total) by mouth every 8 (eight) hours as needed for nausea. 14 tablet 0  . oxybutynin (DITROPAN) 5 MG tablet Take 1 tablet (5 mg total) by mouth daily. (Patient taking differently: Take 5 mg by mouth daily as needed for bladder spasms. ) 90 tablet 3  . tamsulosin (FLOMAX) 0.4 MG CAPS capsule Take 0.4 mg by mouth daily.    Marland Kitchen atorvastatin (LIPITOR) 20 MG tablet Take 1 tablet (20 mg total) by mouth daily. (Patient not taking: Reported on 05/18/2015) 90 tablet 3  . gabapentin (NEURONTIN) 300 MG capsule Take  1 capsule (300 mg total) by mouth 3 (three) times daily. (Patient not taking: Reported on 05/18/2015) 90 capsule 11  . ibuprofen (ADVIL,MOTRIN) 200 MG tablet Take 400 mg by mouth every 6 (six) hours as needed for headache or moderate pain. Reported on 05/27/2015     No current facility-administered medications on file prior to visit.   Review of Systems  Constitutional: Negative for unusual diaphoresis or night sweats HENT: Negative for ringing in ear or discharge Eyes: Negative for double vision or worsening visual disturbance.  Respiratory: Negative for choking and stridor.   Gastrointestinal: Negative for vomiting  Genitourinary: Negative for hematuria or change in  urine volume.  Musculoskeletal: Negative for other MSK pain or swelling Skin: Negative for color change and worsening wound.  Neurological: Negative for tremors and numbness other than noted  Psychiatric/Behavioral: Negative for decreased concentration or agitation other than above       Objective:   Physical Exam BP 128/86 mmHg  Pulse 55  Temp(Src) 98.3 F (36.8 C) (Oral)  Ht 6\' 1"  (1.854 m)  Wt 257 lb (116.574 kg)  BMI 33.91 kg/m2  SpO2 95% VS noted, nontoxic, fatigued appearing Constitutional: Pt appears in no significant distress HENT: Head: NCAT.  Right Ear: External ear normal.  Left Ear: External ear normal.  Eyes: . Pupils are equal, round, and reactive to light. Conjunctivae and EOM are normal Neck: Normal range of motion. Neck supple.  Cardiovascular: Normal rate and regular rhythm.   Pulmonary/Chest: Effort normal and breath sounds without rales or wheezing.  Abd:  Soft,  + BS with mild distension, mild mid epiastric discomfort to palpation but very mild, withou guardin or rebuond Neurological: Pt is alert. Not confused , motor grossly intact Skin: Skin is warm. No rash, no LE edema Psychiatric: Pt behavior is normal. No agitation.  + tender left flank     Assessment & Plan:

## 2015-05-28 ENCOUNTER — Encounter: Payer: Self-pay | Admitting: Internal Medicine

## 2015-05-28 LAB — HEPATIC FUNCTION PANEL
ALT: 18 U/L (ref 0–53)
AST: 25 U/L (ref 0–37)
Albumin: 3.6 g/dL (ref 3.5–5.2)
Alkaline Phosphatase: 95 U/L (ref 39–117)
BILIRUBIN DIRECT: 0 mg/dL (ref 0.0–0.3)
BILIRUBIN TOTAL: 0.4 mg/dL (ref 0.2–1.2)
Total Protein: 7.6 g/dL (ref 6.0–8.3)

## 2015-05-28 LAB — BASIC METABOLIC PANEL
BUN: 42 mg/dL — ABNORMAL HIGH (ref 6–23)
CALCIUM: 9.2 mg/dL (ref 8.4–10.5)
CHLORIDE: 100 meq/L (ref 96–112)
CO2: 31 meq/L (ref 19–32)
CREATININE: 2.82 mg/dL — AB (ref 0.40–1.50)
GFR: 29.16 mL/min — AB (ref >60.00)
Glucose, Bld: 86 mg/dL (ref 70–99)
POTASSIUM: 4.5 meq/L (ref 3.5–5.1)
SODIUM: 139 meq/L (ref 135–145)

## 2015-05-28 NOTE — Assessment & Plan Note (Addendum)
Pain atypical, ECG reviewed as per emr, etiology unclear, most likely related to current illness and/or GI related, doubt cardiac, for cxr as well today with plain films abd

## 2015-05-30 LAB — URINE CULTURE

## 2015-05-30 NOTE — Progress Notes (Unsigned)
   Subjective:    Patient ID: [name], male    DOB: [birth year], 64 y.o.   MRN: [id number]  HPI    Review of Systems     Objective:   Physical Exam        Assessment & Plan:

## 2015-06-02 NOTE — Assessment & Plan Note (Signed)
Also for lab f/u as cr had increased somewhat with last ED visit, to cont fluids, most likely related to infection, consider u/s

## 2015-06-02 NOTE — Assessment & Plan Note (Signed)
Etiology and significance unclear, likely some slower GI fxn due to infection above, cont to monitor, for plain films - r/o early obstruction or ileus

## 2015-06-02 NOTE — Assessment & Plan Note (Addendum)
Etiology unclear as urine cx not helpful, pt not septic but with at least mild persistent GU symtpoms and left flank tender, for repeat urine cx, cont same tx for now but consider change antibx pending results  Note:  Total time for pt hx, exam, review of record with pt in the room, determination of diagnoses and plan for further eval and tx is > 40 min, with over 50% spent in coordination and counseling of patient

## 2015-06-02 NOTE — Assessment & Plan Note (Signed)
Possibly related to the bloating but has intermittent mild for some months, for miralax asd,  to f/u any worsening symptoms or concerns

## 2015-06-03 ENCOUNTER — Other Ambulatory Visit (INDEPENDENT_AMBULATORY_CARE_PROVIDER_SITE_OTHER): Payer: Medicare Other

## 2015-06-03 ENCOUNTER — Encounter: Payer: Self-pay | Admitting: Internal Medicine

## 2015-06-03 ENCOUNTER — Ambulatory Visit (INDEPENDENT_AMBULATORY_CARE_PROVIDER_SITE_OTHER): Payer: Medicare Other | Admitting: Internal Medicine

## 2015-06-03 VITALS — BP 118/78 | HR 63 | Temp 98.0°F | Ht 73.0 in | Wt 258.0 lb

## 2015-06-03 DIAGNOSIS — R7309 Other abnormal glucose: Secondary | ICD-10-CM

## 2015-06-03 DIAGNOSIS — N10 Acute tubulo-interstitial nephritis: Secondary | ICD-10-CM

## 2015-06-03 DIAGNOSIS — I10 Essential (primary) hypertension: Secondary | ICD-10-CM | POA: Diagnosis not present

## 2015-06-03 DIAGNOSIS — N179 Acute kidney failure, unspecified: Secondary | ICD-10-CM | POA: Diagnosis not present

## 2015-06-03 MED ORDER — LEVOFLOXACIN 500 MG PO TABS: 500.0000 mg | ORAL_TABLET | Freq: Every day | ORAL | Status: DC

## 2015-06-03 NOTE — Patient Instructions (Signed)
Please take all new medication as prescribed - the levaquin  Please continue all other medications as before, and refills have been done if requested.  Please have the pharmacy call with any other refills you may need.  Please continue your efforts at being more active, low cholesterol diet, and weight control.  Please keep your appointments with your specialists as you may have planned  Please go to the LAB in the Basement (turn left off the elevator) for the tests to be done today  You will be contacted by phone if any changes need to be made immediately.  Otherwise, you will receive a letter about your results with an explanation, but please check with MyChart first.  Please remember to sign up for MyChart if you have not done so, as this will be important to you in the future with finding out test results, communicating by private email, and scheduling acute appointments online when needed.

## 2015-06-03 NOTE — Assessment & Plan Note (Signed)
stable overall by history and exam, recent data reviewed with pt, and pt to continue medical treatment as before,  to f/u any worsening symptoms or concerns Lab Results  Component Value Date   HGBA1C 6.0 05/29/2014    

## 2015-06-03 NOTE — Assessment & Plan Note (Signed)
stable overall by history and exam, recent data reviewed with pt, and pt to continue medical treatment as before,  to f/u any worsening symptoms or concerns BP Readings from Last 3 Encounters:  06/03/15 118/78  05/27/15 128/86  05/23/15 133/90

## 2015-06-03 NOTE — Progress Notes (Signed)
Pre visit review using our clinic review tool, if applicable. No additional management support is needed unless otherwise documented below in the visit note. 

## 2015-06-03 NOTE — Assessment & Plan Note (Addendum)
Not improving well with cephalexin, 2nd urine cx abnormal, change to levaquin asd,  For f/u cx, to f/u any worsening symptoms or concerns  Note:  Total time for pt hx, exam, review of record with pt in the room, determination of diagnoses and plan for further eval and tx is > 40 min, with over 50% spent in coordination and counseling of patient

## 2015-06-03 NOTE — Assessment & Plan Note (Signed)
Mod worsening recent, for f/u lab today, suspect related to infection, doubt significant volume depletion,  to f/u any worsening symptoms or concerns

## 2015-06-03 NOTE — Progress Notes (Signed)
Subjective:    Patient ID: [name], male    DOB: May 13, 1951, 64 y.o.   MRN: [id number]  HPI  Here to f/u, unfort never started the levaquin so far, denies fever but still with vague left flank and diffuse lower abd discomfort, Denies urinary symptoms such as dysuria, frequency, urgency, flank pain, hematuria or n/v, fever, chills.  gastroesophageal reflux disease except for the above. Pt denies chest pain, increased sob or doe, wheezing, orthopnea, PND, increased LE swelling, palpitations, dizziness or syncope.   Pt denies polydipsia, polyuria Past Medical History  Diagnosis Date  . Hypertension   . Dysuria   . UTI (urinary tract infection)   . Tuberculosis     h/o PPD +  . BPH (benign prostatic hyperplasia)   . Anemia     normal Fe, nl B12, nl retic, nl EPO July '13  . Chronic kidney disease     CKD III, obstructive nephropathy  . Elevated PSA, greater than or equal to 20 ng/ml June '13    PSA 107  . Chronic back pain   . Hyperlipidemia 05/29/2014  . Diverticulosis   . Blood transfusion without reported diagnosis   . Renal insufficiency   . Hemorrhoids, internal, with bleeding, prolapse 09/19/2014   Past Surgical History  Procedure Laterality Date  . Splenectomy    . Colonoscopy    . Carpal tunnel release Right   . Hemorrhoid banding      reports that he has quit smoking. His smoking use included Cigarettes. He has never used smokeless tobacco. He reports that he does not drink alcohol or use illicit drugs. family history includes Diabetes in his mother; Hearing loss in his brother; Stroke in his brother. There is no history of Colon cancer, Rectal cancer, Stomach cancer, or Esophageal cancer. No Known Allergies Current Outpatient Prescriptions on File Prior to Visit  Medication Sig Dispense Refill  . acetaminophen (TYLENOL) 325 MG tablet Take 2 tablets (650 mg total) by mouth every 6 (six) hours as needed. 12 tablet 0  . amLODipine (NORVASC) 5 MG tablet TAKE ONE TABLET  BY MOUTH ONCE DAILY 90 tablet 1  . aspirin 81 MG tablet Take 81 mg by mouth daily.    . cephALEXin (KEFLEX) 500 MG capsule Take 1 capsule (500 mg total) by mouth 2 (two) times daily. 28 capsule 0  . finasteride (PROSCAR) 5 MG tablet TAKE ONE TABLET BY MOUTH ONCE DAILY 90 tablet 1  . ibuprofen (ADVIL,MOTRIN) 200 MG tablet Take 400 mg by mouth every 6 (six) hours as needed for headache or moderate pain. Reported on 05/27/2015    . labetalol (NORMODYNE) 200 MG tablet Take 1 tablet (200 mg total) by mouth every morning. 90 tablet 3  . ondansetron (ZOFRAN-ODT) 4 MG disintegrating tablet Take 1 tablet (4 mg total) by mouth every 8 (eight) hours as needed for nausea. 14 tablet 0  . oxybutynin (DITROPAN) 5 MG tablet Take 1 tablet (5 mg total) by mouth daily. (Patient taking differently: Take 5 mg by mouth daily as needed for bladder spasms. ) 90 tablet 3  . tamsulosin (FLOMAX) 0.4 MG CAPS capsule Take 0.4 mg by mouth daily.    Marland Kitchen gabapentin (NEURONTIN) 300 MG capsule Take 1 capsule (300 mg total) by mouth 3 (three) times daily. (Patient not taking: Reported on 05/18/2015) 90 capsule 11   No current facility-administered medications on file prior to visit.     Review of Systems  Constitutional: Negative for unusual diaphoresis or night sweats  HENT: Negative for ringing in ear or discharge Eyes: Negative for double vision or worsening visual disturbance.  Respiratory: Negative for choking and stridor.   Gastrointestinal: Negative for vomiting or other signifcant bowel change Genitourinary: Negative for hematuria or change in urine volume.  Musculoskeletal: Negative for other MSK pain or swelling Skin: Negative for color change and worsening wound.  Neurological: Negative for tremors and numbness other than noted  Psychiatric/Behavioral: Negative for decreased concentration or agitation other than above       Objective:   Physical Exam BP 118/78 mmHg  Pulse 63  Temp(Src) 98 F (36.7 C) (Oral)   Ht 6\' 1"  (1.854 m)  Wt 258 lb (117.028 kg)  BMI 34.05 kg/m2  SpO2 96% VS noted,  Constitutional: Pt appears in no significant distress HENT: Head: NCAT.  Right Ear: External ear normal.  Left Ear: External ear normal.  Eyes: . Pupils are equal, round, and reactive to light. Conjunctivae and EOM are normal Neck: Normal range of motion. Neck supple.  Cardiovascular: Normal rate and regular rhythm.   Pulmonary/Chest: Effort normal and breath sounds without rales or wheezing.  Abd:  Soft, NT, ND, + BS Neurological: Pt is alert. Not confused , motor grossly intact Skin: Skin is warm. No rash, no LE edema Psychiatric: Pt behavior is normal. No agitation.     7d ago    Culture ENTEROBACTER CLOACAE  STENOTROPHOMONAS MALTOPHILIA       Colony Count >=100,000 COLONIES/ML   Organism ID, Bacteria ENTEROBACTER CLOACAE   Organism ID, Bacteria STENOTROPHOMONAS MALTOPHILIA   Resulting Agency [facility]    Culture & Susceptibility      ENTEROBACTER CLOACAE     Antibiotic Sensitivity Microscan Status    AMOX/CLAVULANIC Resistant >=32 Final    CEFAZOLIN Resistant >=64 Final    CEFEPIME Sensitive <=1 Final    CEFTAZIDIME Sensitive 4 Final    CEFTRIAXONE Intermediate 2 Final    CIPROFLOXACIN Intermediate 2 Final    GENTAMICIN Sensitive <=1 Final    IMIPENEM Sensitive 0.5 Final    LEVOFLOXACIN Intermediate 4 Final    NITROFURANTOIN Intermediate 64 Final    PIP/TAZO Intermediate 64 Final    TOBRAMYCIN Sensitive <=1 Final    TRIMETH/SULFA Sensitive 40 Final    Comment: NR=NOT REPORTABLE,SEE COMMENT ORAL therapy:A cefazolin MIC of <32 predicts  susceptibility to the oral agents cefaclor, cefdinir,cefpodoxime,cefprozil,cefuroxime, cephalexin,and loracarbef when used for therapy  of uncomplicated UTIs due to E.coli,K.pneumomiae, and P.mirabilis. PARENTERAL therapy: A cefazolin MIC of >8 indicates resistance to parenteral cefazolin. An alternate test  method must be performed to confirm susceptibility to parenteral cefazolin.              STENOTROPHOMONAS MALTOPHILIA     Antibiotic Sensitivity Microscan Status    LEVOFLOXACIN Sensitive 1 Final    TRIMETH/SULFA Sensitive <=20 Final    Comment: NR=NOT REPORTABLE,SEE COMMENT ORAL therapy:A cefazolin MIC of <32 predicts  susceptibility to the oral agents cefaclor, cefdinir,cefpodoxime,cefprozil,cefuroxime, cephalexin,and loracarbef when used for therapy  of uncomplicated UTIs due to E.coli,K.pneumomiae, and P.mirabilis. PARENTERAL therapy: A cefazolin MIC of >8 indicates resistance to parenteral cefazolin. An alternate test method must be performed to confirm susceptibility to parenteral cefazolin.               Assessment & Plan:

## 2015-06-04 ENCOUNTER — Other Ambulatory Visit: Payer: Self-pay | Admitting: Internal Medicine

## 2015-06-04 ENCOUNTER — Encounter: Payer: Self-pay | Admitting: Physical Therapy

## 2015-06-04 DIAGNOSIS — N289 Disorder of kidney and ureter, unspecified: Secondary | ICD-10-CM

## 2015-06-04 LAB — BASIC METABOLIC PANEL
BUN: 39 mg/dL — ABNORMAL HIGH (ref 6–23)
CALCIUM: 8.9 mg/dL (ref 8.4–10.5)
CO2: 31 mEq/L (ref 19–32)
Chloride: 100 mEq/L (ref 96–112)
Creatinine, Ser: 2.58 mg/dL — ABNORMAL HIGH (ref 0.40–1.50)
GFR: 32.31 mL/min — AB (ref >60.00)
GLUCOSE: 94 mg/dL (ref 70–99)
Potassium: 4.1 mEq/L (ref 3.5–5.1)
SODIUM: 139 meq/L (ref 135–145)

## 2015-06-04 NOTE — Therapy (Signed)
[facility] [address] [place], [place], [address] Phone: [phone]   Fax:  [phone]  Patient Details  Name: [name] MRN: [id number] Date of Birth: [birth year] Referring Provider:  No ref. provider found  Encounter Date: 06/04/2015   PHYSICAL THERAPY DISCHARGE SUMMARY  Visits from Start of Care: 7  Current functional level related to goals / functional outcomes:     PT Long Term Goals - 07/02/14 1339    PT LONG TERM GOAL #1   Title Pt will verbalize understanding of fall prevention in the home environment. (Target 06/28/14)   Status On-going   PT LONG TERM GOAL #2   Title Pt will verbalize/demonstrate understanding of body mechanics/posture with ADLs to address pain.   PT LONG TERM GOAL #3   Title Pt will be independent with HEP for improved strength, balance, gait.   Status On-going   PT LONG TERM GOAL #4   Title improve Timed Up and Go score to less than or equal to 13.5 seconds for decreased fall risk.   Status Not Met  18.35 sec   PT LONG TERM GOAL #5   Title improve Dynamic Gait index score to at least 17/24 for decreased fall risk.   Status Not Met  DGI 15/24, improved from 12/24    Goals not able to be fully assessed, as pt did not return to therapy after ED visit 06/21/14.   Remaining deficits: Back pain, balance   Education / Equipment: Educated in [facility]  Plan: Patient agrees to discharge.  Patient goals were not met. Patient is being discharged due to not returning since the last visit.  ?????      Shikita Vaillancourt W. 06/04/2015, 8:36 AM [facility] [address] [place], [place], [address] Phone: [phone]   Fax:  [phone]

## 2015-06-06 ENCOUNTER — Ambulatory Visit
Admission: RE | Admit: 2015-06-06 | Discharge: 2015-06-06 | Disposition: A | Discharge status: Home / Self Care | Payer: Medicare Other | Source: Ambulatory Visit | Attending: Internal Medicine | Admitting: Internal Medicine

## 2015-06-06 DIAGNOSIS — N289 Disorder of kidney and ureter, unspecified: Secondary | ICD-10-CM

## 2015-06-10 ENCOUNTER — Other Ambulatory Visit (INDEPENDENT_AMBULATORY_CARE_PROVIDER_SITE_OTHER): Payer: Medicare Other

## 2015-06-10 DIAGNOSIS — Z0189 Encounter for other specified special examinations: Secondary | ICD-10-CM | POA: Diagnosis not present

## 2015-06-10 DIAGNOSIS — N400 Enlarged prostate without lower urinary tract symptoms: Secondary | ICD-10-CM | POA: Diagnosis not present

## 2015-06-10 DIAGNOSIS — Z0000 Encounter for general adult medical examination without abnormal findings: Secondary | ICD-10-CM

## 2015-06-10 DIAGNOSIS — E785 Hyperlipidemia, unspecified: Secondary | ICD-10-CM | POA: Diagnosis not present

## 2015-06-10 LAB — HEPATIC FUNCTION PANEL
ALBUMIN: 3.7 g/dL (ref 3.5–5.2)
ALT: 16 U/L (ref 0–53)
AST: 15 U/L (ref 0–37)
Alkaline Phosphatase: 81 U/L (ref 39–117)
Bilirubin, Direct: 0.1 mg/dL (ref 0.0–0.3)
TOTAL PROTEIN: 7.6 g/dL (ref 6.0–8.3)
Total Bilirubin: 0.5 mg/dL (ref 0.2–1.2)

## 2015-06-10 LAB — URINALYSIS, ROUTINE W REFLEX MICROSCOPIC
Bilirubin Urine: NEGATIVE
KETONES UR: NEGATIVE
NITRITE: NEGATIVE
Specific Gravity, Urine: <=1.005 — AB (ref 1.000–1.030)
TOTAL PROTEIN, URINE-UPE24: NEGATIVE
URINE GLUCOSE: NEGATIVE
UROBILINOGEN UA: 0.2 (ref 0.0–1.0)
pH: 6 (ref 5.0–8.0)

## 2015-06-10 LAB — BASIC METABOLIC PANEL
BUN: 41 mg/dL — ABNORMAL HIGH (ref 6–23)
CALCIUM: 9.2 mg/dL (ref 8.4–10.5)
CO2: 30 mEq/L (ref 19–32)
CREATININE: 2.29 mg/dL — AB (ref 0.40–1.50)
Chloride: 101 mEq/L (ref 96–112)
GFR: 37.07 mL/min — AB (ref >60.00)
GLUCOSE: 112 mg/dL — AB (ref 70–99)
Potassium: 3.6 mEq/L (ref 3.5–5.1)
SODIUM: 138 meq/L (ref 135–145)

## 2015-06-10 LAB — CBC WITH DIFFERENTIAL/PLATELET
BASOS ABS: 0.1 10*3/uL (ref 0.0–0.1)
Basophils Relative: 1 % (ref 0.0–3.0)
EOS PCT: 0.9 % (ref 0.0–5.0)
Eosinophils Absolute: 0.1 10*3/uL (ref 0.0–0.7)
HEMATOCRIT: 39.2 % (ref 39.0–52.0)
HEMOGLOBIN: 12.8 g/dL — AB (ref 13.0–17.0)
LYMPHS PCT: 26.3 % (ref 12.0–46.0)
Lymphs Abs: 2.4 10*3/uL (ref 0.7–4.0)
MCHC: 32.6 g/dL (ref 30.0–36.0)
MCV: 86.7 fl (ref 78.0–100.0)
MONOS PCT: 9.6 % (ref 3.0–12.0)
Monocytes Absolute: 0.9 10*3/uL (ref 0.1–1.0)
NEUTROS PCT: 62.2 % (ref 43.0–77.0)
Neutro Abs: 5.6 10*3/uL (ref 1.4–7.7)
Platelets: 405 10*3/uL — ABNORMAL HIGH (ref 150.0–400.0)
RBC: 4.52 Mil/uL (ref 4.22–5.81)
RDW: 15.2 % (ref 11.5–15.5)
WBC: 9 10*3/uL (ref 4.0–10.5)

## 2015-06-10 LAB — TSH: TSH: 1.26 u[IU]/mL (ref 0.35–4.50)

## 2015-06-10 LAB — LIPID PANEL
CHOLESTEROL: 179 mg/dL (ref 0–200)
HDL: 42.6 mg/dL (ref >39.00)
LDL CALC: 125 mg/dL — AB (ref 0–99)
NonHDL: 136.27
Total CHOL/HDL Ratio: 4
Triglycerides: 54 mg/dL (ref 0.0–149.0)
VLDL: 10.8 mg/dL (ref 0.0–40.0)

## 2015-06-10 LAB — PSA: PSA: 16.56 ng/mL — AB (ref 0.10–4.00)

## 2015-06-11 ENCOUNTER — Encounter: Payer: Self-pay | Admitting: Internal Medicine

## 2015-06-11 ENCOUNTER — Other Ambulatory Visit: Payer: Self-pay | Admitting: Internal Medicine

## 2015-06-11 DIAGNOSIS — N4289 Other specified disorders of prostate: Secondary | ICD-10-CM

## 2015-06-19 DIAGNOSIS — N401 Enlarged prostate with lower urinary tract symptoms: Secondary | ICD-10-CM | POA: Diagnosis not present

## 2015-06-25 DIAGNOSIS — N184 Chronic kidney disease, stage 4 (severe): Secondary | ICD-10-CM | POA: Diagnosis not present

## 2015-06-25 DIAGNOSIS — N2581 Secondary hyperparathyroidism of renal origin: Secondary | ICD-10-CM | POA: Diagnosis not present

## 2015-06-25 DIAGNOSIS — D631 Anemia in chronic kidney disease: Secondary | ICD-10-CM | POA: Diagnosis not present

## 2015-07-23 DIAGNOSIS — R338 Other retention of urine: Secondary | ICD-10-CM | POA: Diagnosis not present

## 2015-08-20 DIAGNOSIS — R338 Other retention of urine: Secondary | ICD-10-CM | POA: Diagnosis not present

## 2015-09-24 DIAGNOSIS — N320 Bladder-neck obstruction: Secondary | ICD-10-CM | POA: Diagnosis not present

## 2015-10-23 DIAGNOSIS — R338 Other retention of urine: Secondary | ICD-10-CM | POA: Diagnosis not present

## 2015-10-27 ENCOUNTER — Other Ambulatory Visit: Payer: Self-pay | Admitting: Internal Medicine

## 2015-11-05 ENCOUNTER — Other Ambulatory Visit: Payer: Self-pay | Admitting: Internal Medicine

## 2015-11-25 ENCOUNTER — Ambulatory Visit: Payer: Medicare Other | Admitting: Internal Medicine

## 2015-11-26 DIAGNOSIS — R338 Other retention of urine: Secondary | ICD-10-CM | POA: Diagnosis not present

## 2015-11-27 ENCOUNTER — Encounter: Payer: Self-pay | Admitting: Internal Medicine

## 2015-11-27 ENCOUNTER — Other Ambulatory Visit: Payer: Self-pay | Admitting: Internal Medicine

## 2015-11-27 ENCOUNTER — Other Ambulatory Visit (INDEPENDENT_AMBULATORY_CARE_PROVIDER_SITE_OTHER): Payer: Medicare Other

## 2015-11-27 ENCOUNTER — Ambulatory Visit (INDEPENDENT_AMBULATORY_CARE_PROVIDER_SITE_OTHER): Payer: Medicare Other | Admitting: Internal Medicine

## 2015-11-27 VITALS — BP 122/76 | HR 60 | Temp 98.4°F | Resp 20 | Wt 251.0 lb

## 2015-11-27 DIAGNOSIS — R6889 Other general symptoms and signs: Secondary | ICD-10-CM | POA: Diagnosis not present

## 2015-11-27 DIAGNOSIS — R7309 Other abnormal glucose: Secondary | ICD-10-CM

## 2015-11-27 DIAGNOSIS — Z1159 Encounter for screening for other viral diseases: Secondary | ICD-10-CM

## 2015-11-27 DIAGNOSIS — Z0001 Encounter for general adult medical examination with abnormal findings: Secondary | ICD-10-CM

## 2015-11-27 DIAGNOSIS — R972 Elevated prostate specific antigen [PSA]: Secondary | ICD-10-CM | POA: Diagnosis not present

## 2015-11-27 DIAGNOSIS — Z23 Encounter for immunization: Secondary | ICD-10-CM | POA: Diagnosis not present

## 2015-11-27 DIAGNOSIS — N189 Chronic kidney disease, unspecified: Secondary | ICD-10-CM | POA: Diagnosis not present

## 2015-11-27 DIAGNOSIS — I10 Essential (primary) hypertension: Secondary | ICD-10-CM | POA: Diagnosis not present

## 2015-11-27 DIAGNOSIS — N182 Chronic kidney disease, stage 2 (mild): Secondary | ICD-10-CM

## 2015-11-27 DIAGNOSIS — N139 Obstructive and reflux uropathy, unspecified: Secondary | ICD-10-CM | POA: Insufficient documentation

## 2015-11-27 DIAGNOSIS — E785 Hyperlipidemia, unspecified: Secondary | ICD-10-CM | POA: Diagnosis not present

## 2015-11-27 HISTORY — DX: Obstructive and reflux uropathy, unspecified: N13.9

## 2015-11-27 LAB — HEPATIC FUNCTION PANEL
ALBUMIN: 3.9 g/dL (ref 3.5–5.2)
ALK PHOS: 77 U/L (ref 39–117)
ALT: 10 U/L (ref 0–53)
AST: 15 U/L (ref 0–37)
Bilirubin, Direct: 0.1 mg/dL (ref 0.0–0.3)
TOTAL PROTEIN: 7 g/dL (ref 6.0–8.3)
Total Bilirubin: 0.7 mg/dL (ref 0.2–1.2)

## 2015-11-27 LAB — URINALYSIS, ROUTINE W REFLEX MICROSCOPIC
Bilirubin Urine: NEGATIVE
KETONES UR: NEGATIVE
Nitrite: NEGATIVE
PH: 6 (ref 5.0–8.0)
Total Protein, Urine: 30 — AB
Urine Glucose: NEGATIVE
Urobilinogen, UA: 0.2 (ref 0.0–1.0)

## 2015-11-27 LAB — LIPID PANEL
CHOLESTEROL: 196 mg/dL (ref 0–200)
HDL: 46.8 mg/dL (ref >39.00)
LDL CALC: 135 mg/dL — AB (ref 0–99)
NonHDL: 149.1
TRIGLYCERIDES: 70 mg/dL (ref 0.0–149.0)
Total CHOL/HDL Ratio: 4
VLDL: 14 mg/dL (ref 0.0–40.0)

## 2015-11-27 LAB — CBC WITH DIFFERENTIAL/PLATELET
BASOS ABS: 0.1 10*3/uL (ref 0.0–0.1)
Basophils Relative: 1.1 % (ref 0.0–3.0)
EOS PCT: 3.7 % (ref 0.0–5.0)
Eosinophils Absolute: 0.2 10*3/uL (ref 0.0–0.7)
HCT: 41.4 % (ref 39.0–52.0)
HEMOGLOBIN: 13.8 g/dL (ref 13.0–17.0)
LYMPHS ABS: 2.9 10*3/uL (ref 0.7–4.0)
Lymphocytes Relative: 50.1 % — ABNORMAL HIGH (ref 12.0–46.0)
MCHC: 33.3 g/dL (ref 30.0–36.0)
MCV: 85.4 fl (ref 78.0–100.0)
MONOS PCT: 9.6 % (ref 3.0–12.0)
Monocytes Absolute: 0.6 10*3/uL (ref 0.1–1.0)
NEUTROS PCT: 35.5 % — AB (ref 43.0–77.0)
Neutro Abs: 2.1 10*3/uL (ref 1.4–7.7)
Platelets: 255 10*3/uL (ref 150.0–400.0)
RBC: 4.85 Mil/uL (ref 4.22–5.81)
RDW: 14.3 % (ref 11.5–15.5)
WBC: 5.8 10*3/uL (ref 4.0–10.5)

## 2015-11-27 LAB — BASIC METABOLIC PANEL
BUN: 38 mg/dL — AB (ref 6–23)
CALCIUM: 8.8 mg/dL (ref 8.4–10.5)
CO2: 33 meq/L — AB (ref 19–32)
CREATININE: 2.83 mg/dL — AB (ref 0.40–1.50)
Chloride: 102 mEq/L (ref 96–112)
GFR: 29 mL/min — AB (ref >60.00)
GLUCOSE: 82 mg/dL (ref 70–99)
Potassium: 3.2 mEq/L — ABNORMAL LOW (ref 3.5–5.1)
Sodium: 137 mEq/L (ref 135–145)

## 2015-11-27 LAB — TSH: TSH: 1.57 u[IU]/mL (ref 0.35–4.50)

## 2015-11-27 LAB — PSA: PSA: 10.05 ng/mL — ABNORMAL HIGH (ref 0.10–4.00)

## 2015-11-27 LAB — HEMOGLOBIN A1C: HEMOGLOBIN A1C: 5.8 % (ref 4.6–6.5)

## 2015-11-27 MED ORDER — ATORVASTATIN CALCIUM 10 MG PO TABS: 10.0000 mg | ORAL_TABLET | Freq: Every day | ORAL | Status: DC

## 2015-11-27 NOTE — Assessment & Plan Note (Addendum)
Lab Results  Component Value Date   PSA 16.56* 06/10/2015   PSA 105.70* 11/24/2011   No hx of prostate ca, followed by urology, has BPH with obstructive uropathy hx, declines TURP, has recurrent infections, for /fu psa today per pt request

## 2015-11-27 NOTE — Progress Notes (Signed)
Pre visit review using our clinic review tool, if applicable. No additional management support is needed unless otherwise documented below in the visit note. 

## 2015-11-27 NOTE — Patient Instructions (Signed)
You had the Prevnar pneumonia shot today  Please continue all other medications as before, and refills have been done if requested.  Please have the pharmacy call with any other refills you may need.  Please continue your efforts at being more active, low cholesterol diet, and weight control.  You are otherwise up to date with prevention measures today.  Please keep your appointments with your specialists as you may have planned  Please go to the LAB in the Basement (turn left off the elevator) for the tests to be done today  You will be contacted by phone if any changes need to be made immediately.  Otherwise, you will receive a letter about your results with an explanation, but please check with MyChart first.  Please remember to sign up for MyChart if you have not done so, as this will be important to you in the future with finding out test results, communicating by private email, and scheduling acute appointments online when needed.  Please return in 6 months, or sooner if needed 

## 2015-11-27 NOTE — Progress Notes (Signed)
Subjective:    Patient ID: [name], male    DOB: Aug 06, 1950, 65 y.o.   MRN: [id number]  HPI   Here for wellness and f/u;  Overall doing ok;  Pt denies Chest pain, worsening SOB, DOE, wheezing, orthopnea, PND, worsening LE edema, palpitations, dizziness or syncope.  Pt denies neurological change such as new headache, facial or extremity weakness  Pt denies worsening depressive symptoms, suicidal ideation or panic. No fever, night sweats, wt loss, loss of appetite, or other constitutional symptoms.  Pt states good ability with ADL's, has low fall risk, home safety reviewed and adequate, no other significant changes in hearing or vision, and only occasionally active with exercise. Wt Readings from Last 3 Encounters:  11/27/15 251 lb (113.853 kg)  06/03/15 258 lb (117.028 kg)  05/27/15 257 lb (116.574 kg)  Sees urology monthly, and renal every 6 mo. Denies urinary symptoms such as dysuria, frequency, urgency, flank pain, hematuria or n/v, fever, chills.  Has 1-2 times per night nocturia  Denies worsening reflux, abd pain, dysphagia, n/v, bowel change or blood.   Pt denies polydipsia, polyuria, or low sugar symptoms such as weakness or confusion improved with po intake.  Pt states overall good compliance with meds, trying to follow lower cholesterol, diabetic diet, wt overall stable but little exercise however.     Past Medical History  Diagnosis Date  . Hypertension   . Dysuria   . UTI (urinary tract infection)   . Tuberculosis     h/o PPD +  . BPH (benign prostatic hyperplasia)   . Anemia     normal Fe, nl B12, nl retic, nl EPO July '13  . Chronic kidney disease     CKD III, obstructive nephropathy  . Elevated PSA, greater than or equal to 20 ng/ml June '13    PSA 107  . Chronic back pain   . Hyperlipidemia 05/29/2014  . Diverticulosis   . Blood transfusion without reported diagnosis   . Renal insufficiency   . Hemorrhoids, internal, with bleeding, prolapse 09/19/2014  .  Obstructive uropathy 11/27/2015   Past Surgical History  Procedure Laterality Date  . Splenectomy    . Colonoscopy    . Carpal tunnel release Right   . Hemorrhoid banding      reports that he has quit smoking. His smoking use included Cigarettes. He has never used smokeless tobacco. He reports that he does not drink alcohol or use illicit drugs. family history includes Diabetes in his mother; Hearing loss in his brother; Stroke in his brother. There is no history of Colon cancer, Rectal cancer, Stomach cancer, or Esophageal cancer. No Known Allergies Current Outpatient Prescriptions on File Prior to Visit  Medication Sig Dispense Refill  . acetaminophen (TYLENOL) 325 MG tablet Take 2 tablets (650 mg total) by mouth every 6 (six) hours as needed. 12 tablet 0  . amLODipine (NORVASC) 5 MG tablet TAKE ONE TABLET BY MOUTH ONCE DAILY 90 tablet 1  . aspirin 81 MG tablet Take 81 mg by mouth daily.    . finasteride (PROSCAR) 5 MG tablet TAKE ONE TABLET BY MOUTH ONCE DAILY 90 tablet 1  . gabapentin (NEURONTIN) 300 MG capsule Take 1 capsule (300 mg total) by mouth 3 (three) times daily. 90 capsule 11  . ibuprofen (ADVIL,MOTRIN) 200 MG tablet Take 400 mg by mouth every 6 (six) hours as needed for headache or moderate pain. Reported on 05/27/2015    . labetalol (NORMODYNE) 200 MG tablet Take 1 tablet (200  mg total) by mouth every morning. 90 tablet 3  . naproxen (NAPROSYN) 500 MG tablet TAKE ONE TABLET BY MOUTH TWICE DAILY 30 tablet 0  . naproxen (NAPROSYN) 500 MG tablet TAKE ONE TABLET BY MOUTH TWICE DAILY 30 tablet 0  . ondansetron (ZOFRAN-ODT) 4 MG disintegrating tablet Take 1 tablet (4 mg total) by mouth every 8 (eight) hours as needed for nausea. 14 tablet 0  . oxybutynin (DITROPAN) 5 MG tablet Take 1 tablet (5 mg total) by mouth daily. (Patient taking differently: Take 5 mg by mouth daily as needed for bladder spasms. ) 90 tablet 3  . tamsulosin (FLOMAX) 0.4 MG CAPS capsule Take 0.4 mg by mouth  daily.     No current facility-administered medications on file prior to visit.   Review of Systems Constitutional: Negative for increased diaphoresis, or other activity, appetite or siginficant weight change other than noted HENT: Negative for worsening hearing loss, ear pain, facial swelling, mouth sores and neck stiffness.   Eyes: Negative for other worsening pain, redness or visual disturbance.  Respiratory: Negative for choking or stridor Cardiovascular: Negative for other chest pain and palpitations.  Gastrointestinal: Negative for worsening diarrhea, blood in stool, or abdominal distention Genitourinary: Negative for hematuria, flank pain or change in urine volume.  Musculoskeletal: Negative for myalgias or other joint complaints.  Skin: Negative for other color change and wound or drainage.  Neurological: Negative for syncope and numbness. other than noted Hematological: Negative for adenopathy. or other swelling Psychiatric/Behavioral: Negative for hallucinations, SI, self-injury, decreased concentration or other worsening agitation.      Objective:   Physical Exam BP 122/76 mmHg  Pulse 60  Temp(Src) 98.4 F (36.9 C) (Oral)  Resp 20  Wt 251 lb (113.853 kg)  SpO2 95% VS noted, obese Constitutional: Pt is oriented to person, place, and time. Appears well-developed and well-nourished, in no significant distress Head: Normocephalic and atraumatic  Eyes: Conjunctivae and EOM are normal. Pupils are equal, round, and reactive to light Right Ear: External ear normal.  Left Ear: External ear normal Nose: Nose normal.  Mouth/Throat: Oropharynx is clear and moist  Neck: Normal range of motion. Neck supple. No JVD present. No tracheal deviation present or significant neck LA or mass Cardiovascular: Normal rate, regular rhythm, normal heart sounds and intact distal pulses.   Pulmonary/Chest: Effort normal and breath sounds without rales or wheezing  Abdominal: Soft. Bowel sounds  are normal. NT. No HSM  Musculoskeletal: Normal range of motion. Exhibits no edema Lymphadenopathy: Has no cervical adenopathy.  Neurological: Pt is alert and oriented to person, place, and time. Pt has normal reflexes. No cranial nerve deficit. Motor grossly intact Skin: Skin is warm and dry. No rash noted or new ulcers Psychiatric:  Has normal mood and affect. Behavior is normal.      Assessment & Plan:

## 2015-11-28 LAB — HEPATITIS C ANTIBODY: HCV Ab: NEGATIVE

## 2015-11-30 NOTE — Assessment & Plan Note (Signed)

## 2015-11-30 NOTE — Assessment & Plan Note (Signed)
stable overall by history and exam, recent data reviewed with pt, and pt to continue medical treatment as before,  to f/u any worsening symptoms or concerns Lab Results  Component Value Date   CREATININE 2.83* 11/27/2015

## 2015-11-30 NOTE — Assessment & Plan Note (Addendum)
Goal ldl < 100, has been persistently mild elevated, willing to consider statin, for lab today  In addition to the time spent performing CPE, I spent an additional 15 minutes face to face,in which greater than 50% of this time was spent in counseling and coordination of care for patient's illness as documented.

## 2015-11-30 NOTE — Assessment & Plan Note (Signed)
stable overall by history and exam, recent data reviewed with pt, and pt to continue medical treatment as before,  to f/u any worsening symptoms or concerns BP Readings from Last 3 Encounters:  11/27/15 122/76  06/03/15 118/78  05/27/15 128/86

## 2015-11-30 NOTE — Assessment & Plan Note (Signed)
stable overall by history and exam, recent data reviewed with pt, and pt to continue medical treatment as before,  to f/u any worsening symptoms or concerns Lab Results  Component Value Date   HGBA1C 5.8 11/27/2015    

## 2015-12-05 ENCOUNTER — Other Ambulatory Visit: Payer: Self-pay | Admitting: Internal Medicine

## 2015-12-25 DIAGNOSIS — R339 Retention of urine, unspecified: Secondary | ICD-10-CM | POA: Diagnosis not present

## 2016-01-10 ENCOUNTER — Other Ambulatory Visit: Payer: Self-pay | Admitting: Internal Medicine

## 2016-01-21 DIAGNOSIS — R338 Other retention of urine: Secondary | ICD-10-CM | POA: Diagnosis not present

## 2016-02-05 DIAGNOSIS — N184 Chronic kidney disease, stage 4 (severe): Secondary | ICD-10-CM | POA: Diagnosis not present

## 2016-02-05 DIAGNOSIS — N2581 Secondary hyperparathyroidism of renal origin: Secondary | ICD-10-CM | POA: Diagnosis not present

## 2016-02-05 DIAGNOSIS — D631 Anemia in chronic kidney disease: Secondary | ICD-10-CM | POA: Diagnosis not present

## 2016-02-20 LAB — GLUCOSE, POCT (MANUAL RESULT ENTRY): POC GLUCOSE: 95 mg/dL (ref 70–99)

## 2016-02-24 ENCOUNTER — Other Ambulatory Visit: Payer: Self-pay | Admitting: Internal Medicine

## 2016-02-25 DIAGNOSIS — R338 Other retention of urine: Secondary | ICD-10-CM | POA: Diagnosis not present

## 2016-03-17 ENCOUNTER — Other Ambulatory Visit: Payer: Self-pay | Admitting: Internal Medicine

## 2016-03-23 ENCOUNTER — Other Ambulatory Visit: Payer: Self-pay | Admitting: *Deleted

## 2016-03-23 MED ORDER — TAMSULOSIN HCL 0.4 MG PO CAPS: 0.4000 mg | ORAL_CAPSULE | Freq: Every day | ORAL | 2 refills | Status: DC

## 2016-03-23 MED ORDER — LABETALOL HCL 200 MG PO TABS: 200.0000 mg | ORAL_TABLET | Freq: Every morning | ORAL | 2 refills | Status: DC

## 2016-03-23 MED ORDER — FINASTERIDE 5 MG PO TABS: 5.0000 mg | ORAL_TABLET | Freq: Every day | ORAL | 2 refills | Status: DC

## 2016-03-23 MED ORDER — AMLODIPINE BESYLATE 5 MG PO TABS: 5.0000 mg | ORAL_TABLET | Freq: Every day | ORAL | 2 refills | Status: DC

## 2016-03-23 MED ORDER — ATORVASTATIN CALCIUM 10 MG PO TABS: 10.0000 mg | ORAL_TABLET | Freq: Every day | ORAL | 2 refills | Status: DC

## 2016-03-24 DIAGNOSIS — R339 Retention of urine, unspecified: Secondary | ICD-10-CM | POA: Diagnosis not present

## 2016-03-25 ENCOUNTER — Other Ambulatory Visit: Payer: Self-pay | Admitting: Internal Medicine

## 2016-04-08 ENCOUNTER — Ambulatory Visit (INDEPENDENT_AMBULATORY_CARE_PROVIDER_SITE_OTHER): Payer: Medicare Other

## 2016-04-08 DIAGNOSIS — Z23 Encounter for immunization: Secondary | ICD-10-CM | POA: Diagnosis not present

## 2016-04-22 DIAGNOSIS — R310 Gross hematuria: Secondary | ICD-10-CM | POA: Diagnosis not present

## 2016-04-22 DIAGNOSIS — R339 Retention of urine, unspecified: Secondary | ICD-10-CM | POA: Diagnosis not present

## 2016-04-22 DIAGNOSIS — N3001 Acute cystitis with hematuria: Secondary | ICD-10-CM | POA: Diagnosis not present

## 2016-04-29 ENCOUNTER — Emergency Department (HOSPITAL_COMMUNITY)
Admission: EM | Admit: 2016-04-29 | Discharge: 2016-04-29 | Disposition: A | Discharge status: Home / Self Care | Payer: Medicare Other | Attending: Emergency Medicine | Admitting: Emergency Medicine

## 2016-04-29 ENCOUNTER — Encounter (HOSPITAL_COMMUNITY): Payer: Self-pay

## 2016-04-29 DIAGNOSIS — Z7982 Long term (current) use of aspirin: Secondary | ICD-10-CM | POA: Insufficient documentation

## 2016-04-29 DIAGNOSIS — Z79899 Other long term (current) drug therapy: Secondary | ICD-10-CM | POA: Insufficient documentation

## 2016-04-29 DIAGNOSIS — N390 Urinary tract infection, site not specified: Secondary | ICD-10-CM

## 2016-04-29 DIAGNOSIS — R339 Retention of urine, unspecified: Secondary | ICD-10-CM | POA: Insufficient documentation

## 2016-04-29 DIAGNOSIS — I129 Hypertensive chronic kidney disease with stage 1 through stage 4 chronic kidney disease, or unspecified chronic kidney disease: Secondary | ICD-10-CM | POA: Insufficient documentation

## 2016-04-29 DIAGNOSIS — Y738 Miscellaneous gastroenterology and urology devices associated with adverse incidents, not elsewhere classified: Secondary | ICD-10-CM | POA: Diagnosis not present

## 2016-04-29 DIAGNOSIS — T839XXA Unspecified complication of genitourinary prosthetic device, implant and graft, initial encounter: Secondary | ICD-10-CM

## 2016-04-29 DIAGNOSIS — Z87891 Personal history of nicotine dependence: Secondary | ICD-10-CM | POA: Diagnosis not present

## 2016-04-29 DIAGNOSIS — T83098A Other mechanical complication of other indwelling urethral catheter, initial encounter: Secondary | ICD-10-CM | POA: Insufficient documentation

## 2016-04-29 DIAGNOSIS — N183 Chronic kidney disease, stage 3 (moderate): Secondary | ICD-10-CM | POA: Insufficient documentation

## 2016-04-29 LAB — URINE MICROSCOPIC-ADD ON

## 2016-04-29 LAB — URINALYSIS, ROUTINE W REFLEX MICROSCOPIC
Bilirubin Urine: NEGATIVE
Glucose, UA: NEGATIVE mg/dL
KETONES UR: NEGATIVE mg/dL
NITRITE: NEGATIVE
PH: 7 (ref 5.0–8.0)
PROTEIN: NEGATIVE mg/dL
Specific Gravity, Urine: 1.011 (ref 1.005–1.030)

## 2016-04-29 MED ORDER — LIDOCAINE HCL 2 % EX GEL
1.0000 "application " | Freq: Once | CUTANEOUS | Status: AC | PRN
  Administered 2016-04-29: 1 via URETHRAL
  Filled 2016-04-29: qty 11

## 2016-04-29 MED ORDER — SULFAMETHOXAZOLE-TRIMETHOPRIM 800-160 MG PO TABS
1.0000 | ORAL_TABLET | Freq: Once | ORAL | Status: AC
  Administered 2016-04-29: 1 via ORAL
  Filled 2016-04-29: qty 1

## 2016-04-29 MED ORDER — SULFAMETHOXAZOLE-TRIMETHOPRIM 800-160 MG PO TABS: 1.0000 | ORAL_TABLET | Freq: Two times a day (BID) | ORAL | 0 refills | Status: AC

## 2016-04-29 NOTE — ED Triage Notes (Signed)
Pt has a permanent catheter and today is has been blocked, he tried to flush it with no success, pt complains of abd pain

## 2016-04-29 NOTE — ED Notes (Signed)
Unable to irrigate foley-removed and will replace

## 2016-04-29 NOTE — Discharge Instructions (Signed)
Your urine has been cultured, if the antibiotic is ineffective  you will be notified in the next 1-2 day other wise take until all tablets taken.  Make an appointment with your PCP for a repeat urine next week

## 2016-04-29 NOTE — ED Provider Notes (Addendum)
[facility] DEPT Provider Note   CSN: [id number] Arrival date & time: 04/29/16  2014   By signing my name below, I, Macon Large, attest that this documentation has been prepared under the direction and in the presence of  Junius Creamer, NP. Electronically Signed: Macon Large, ED Scribe. 04/29/16. 8:36 PM.  History   Chief Complaint Chief Complaint  Patient presents with  . Urinary Retention    The history is provided by the patient. No language interpreter was used.   HPI Comments: [name] is a 65 y.o. male who presents to the Emergency Department complaining of moderate, constant, abdominal pain onset today. Pt states he has had a catheter in for years for Hx of chronic urinary tract infections. Per pt, he notes his catheter was changed a week ago. Pt denies self-catheterization. He states he has had some green drainage and believes there is a blockage. He reports he has not had any urinary  drainage for ~4-5 hours. He notes attempting to flush it but was unsuccessful. Pt notes having abdominal pain and believes it might be due to unsuccessful drainage of catheter. No modifying factors noted. He denies nausea. No additional complaints at this time.   Past Medical History:  Diagnosis Date  . Anemia    normal Fe, nl B12, nl retic, nl EPO July '13  . Blood transfusion without reported diagnosis   . BPH (benign prostatic hyperplasia)   . Chronic back pain   . Chronic kidney disease    CKD III, obstructive nephropathy  . Diverticulosis   . Dysuria   . Elevated PSA, greater than or equal to 20 ng/ml June '13   PSA 107  . Hemorrhoids, internal, with bleeding, prolapse 09/19/2014  . Hyperlipidemia 05/29/2014  . Hypertension   . Obstructive uropathy 11/27/2015  . Renal insufficiency   . Tuberculosis    h/o PPD +  . UTI (urinary tract infection)     Patient Active Problem List   Diagnosis Date Noted  . Encounter for well adult exam with abnormal findings 11/27/2015   . Obstructive uropathy 11/27/2015  . Elevated PSA 11/27/2015  . Acute pyelonephritis 05/27/2015  . Generalized bloating 05/27/2015  . Constipation 05/27/2015  . Chest pain 05/27/2015  . AKI (acute kidney injury) ([place]) 05/27/2015  . Acute sinus infection 11/22/2014  . Cough 09/25/2014  . Hemorrhoids, internal, with bleeding, prolapse 09/19/2014  . 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 Hyperlipidemia 05/29/2014  . Lumbar radiculopathy 05/23/2014  . Lumbar stenosis 11/20/2013  . Abnormal glucose 11/06/2013  . Unspecified hereditary and idiopathic peripheral neuropathy 01/22/2013  . 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 Chronic renal insufficiency, stage II (mild) 11/24/2011  . Hematuria 11/24/2011  . Hydronephrosis 11/24/2011  . Anemia 11/24/2011  . BRADYCARDIA 02/05/2010  . TINEA PEDIS 01/29/2010  . 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 PERIPHERAL EDEMA 01/29/2010  . ABNORMAL ELECTROCARDIOGRAM 01/29/2010  . 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  . Osteoarthrosis, generalized, 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  . ONYCHOMYCOSIS, TOENAILS 10/02/2007  . BENIGN PROSTATIC HYPERTROPHY 10/02/2007  . Essential hypertension 03/06/2007    Past Surgical History:  Procedure Laterality Date  . CARPAL TUNNEL RELEASE Right   . COLONOSCOPY    . HEMORRHOID BANDING    . SPLENECTOMY         Home Medications    Prior to Admission medications   Medication Sig Start Date End Date Taking? Authorizing Provider  acetaminophen (TYLENOL) 325 MG tablet Take 2 tablets (650 mg total) by mouth every 6 (six)  hours as needed. Patient taking differently: Take 650 mg by mouth every 6 (six) hours as needed for mild pain or moderate pain.  05/23/15  Yes Varney Biles, MD  amLODipine (NORVASC) 5 MG tablet Take 1 tablet (5 mg total) by mouth daily. 03/23/16  Yes Biagio Borg, MD  aspirin 81 MG tablet Take 81 mg by mouth daily.   Yes Historical Provider, MD  atorvastatin (LIPITOR) 20 MG tablet TAKE ONE  TABLET BY MOUTH ONCE DAILY Patient taking differently: TAKE 20 MG  BY MOUTH ONCE DAILY 03/18/16  Yes Biagio Borg, MD  finasteride (PROSCAR) 5 MG tablet Take 1 tablet (5 mg total) by mouth daily. 03/23/16  Yes Biagio Borg, MD  labetalol (NORMODYNE) 200 MG tablet Take 1 tablet (200 mg total) by mouth every morning. 03/23/16  Yes Biagio Borg, MD  naproxen (NAPROSYN) 500 MG tablet TAKE ONE TABLET BY MOUTH TWICE DAILY Patient taking differently: TAKE 500 MG BY MOUTH TWICE DAILY AS NEEDED FOR PAIN 03/25/16  Yes Biagio Borg, MD  oxybutynin (DITROPAN) 5 MG tablet Take 1 tablet (5 mg total) by mouth daily. Patient taking differently: Take 5 mg by mouth daily as needed for bladder spasms.  05/29/14  Yes Biagio Borg, MD  tamsulosin (FLOMAX) 0.4 MG CAPS capsule Take 1 capsule (0.4 mg total) by mouth daily. 03/23/16  Yes Biagio Borg, MD  atorvastatin (LIPITOR) 10 MG tablet Take 1 tablet (10 mg total) by mouth daily. Patient not taking: Reported on 04/29/2016 03/23/16   Biagio Borg, MD  gabapentin (NEURONTIN) 300 MG capsule Take 1 capsule (300 mg total) by mouth 3 (three) times daily. Patient not taking: Reported on 04/29/2016 05/23/14   Marcial Pacas, MD  sulfamethoxazole-trimethoprim (BACTRIM DS,SEPTRA DS) 800-160 MG tablet Take 1 tablet by mouth 2 (two) times daily. 04/29/16 05/06/16  Junius Creamer, NP    Family History Family History  Problem Relation Age of Onset  . Diabetes Mother   . Stroke Brother   . Hearing loss Brother   . Colon cancer Neg Hx   . Rectal cancer Neg Hx   . Stomach cancer Neg Hx   . Esophageal cancer Neg Hx     Social History Social History  Substance Use Topics  . Smoking status: Former Smoker    Types: Cigarettes  . Smokeless tobacco: Never Used     Comment: Quit 1981  . Alcohol use No     Comment: Quit 2004     Allergies   Patient has no known allergies.   Review of Systems Review of Systems  Constitutional: Negative for activity change, appetite change  and fever.  HENT: Negative.   Eyes: Negative.   Respiratory: Negative for shortness of breath.   Cardiovascular: Negative for chest pain and leg swelling.  Gastrointestinal: Positive for abdominal pain. Negative for nausea.  Endocrine: Negative.   Genitourinary: Positive for decreased urine volume.  Musculoskeletal: Negative.   Allergic/Immunologic: Negative.   Neurological: Negative.   Hematological: Negative.   Psychiatric/Behavioral: Negative.   All other systems reviewed and are negative.    Physical Exam Updated Vital Signs BP 150/93   Pulse 64   Temp 98.5 F (36.9 C) (Oral)   Resp 18   SpO2 99%   Physical Exam  Constitutional: He appears well-developed and well-nourished.  Eyes: Pupils are equal, round, and reactive to light.  Neck: Normal range of motion.  Cardiovascular: Normal rate.   Pulmonary/Chest: Effort normal.  Abdominal: Soft.  Genitourinary:  Penis normal.  Genitourinary Comments: Foley in place with little drainage   Musculoskeletal: Normal range of motion.  Neurological: He is alert.  Skin: Skin is warm.  Nursing note and vitals reviewed.    ED Treatments / Results   DIAGNOSTIC STUDIES: Oxygen Saturation is 95% on RA, adequate by my interpretation.    COORDINATION OF CARE: 8:25 PM Discussed treatment plan with pt at bedside and pt agreed to plan.   Labs (all labs ordered are listed, but only abnormal results are displayed) Labs Reviewed  URINALYSIS, ROUTINE W REFLEX MICROSCOPIC (NOT AT [facility]) - Abnormal; Notable for the following:       Result Value   APPearance CLOUDY (*)    Hgb urine dipstick LARGE (*)    Leukocytes, UA LARGE (*)    All other components within normal limits  URINE MICROSCOPIC-ADD ON - Abnormal; Notable for the following:    Squamous Epithelial / LPF 0-5 (*)    Bacteria, UA MANY (*)    All other components within normal limits  URINE CULTURE    EKG  EKG Interpretation None       Radiology No results  found.  Procedures Procedures (including critical care time)  Medications Ordered in ED Medications  sulfamethoxazole-trimethoprim (BACTRIM DS,SEPTRA DS) 800-160 MG per tablet 1 tablet (not administered)  lidocaine (XYLOCAINE) 2 % jelly 1 application (1 application Urethral Given 04/29/16 2059)     Initial Impression / Assessment and Plan / ED Course  I have reviewed the triage vital signs and the nursing notes.  Pertinent labs & imaging results that were available during my care of the patient were reviewed by me and considered in my medical decision making (see chart for details).  Clinical Course      Will have nurse attempt irrigation if unsuccessful will change out catheter  Urine shows many bacteria and leukocytosis will treat with Bactrium as last culture showed sensativity  Will FU with PCP next week for repeat UA Final Clinical Impressions(s) / ED Diagnoses   Final diagnoses:  Lower urinary tract infectious disease  Problem with Foley catheter, initial encounter ([place])    New Prescriptions New Prescriptions   SULFAMETHOXAZOLE-TRIMETHOPRIM (BACTRIM DS,SEPTRA DS) 800-160 MG TABLET    Take 1 tablet by mouth 2 (two) times daily.    I personally performed the services described in this documentation, which was scribed in my presence. The recorded information has been reviewed and is accurate.    Junius Creamer, NP 04/29/16 2159    Junius Creamer, NP 04/29/16 [place], MD 05/01/16 [place], NP 05/05/16 2129    Quintella Reichert, MD 05/07/16 [phone]

## 2016-05-01 LAB — URINE CULTURE

## 2016-05-25 DIAGNOSIS — N401 Enlarged prostate with lower urinary tract symptoms: Secondary | ICD-10-CM | POA: Diagnosis not present

## 2016-05-28 ENCOUNTER — Encounter: Payer: Self-pay | Admitting: Internal Medicine

## 2016-05-28 ENCOUNTER — Ambulatory Visit (INDEPENDENT_AMBULATORY_CARE_PROVIDER_SITE_OTHER): Payer: Medicare Other | Admitting: Internal Medicine

## 2016-05-28 ENCOUNTER — Other Ambulatory Visit (INDEPENDENT_AMBULATORY_CARE_PROVIDER_SITE_OTHER): Payer: Medicare Other

## 2016-05-28 VITALS — BP 126/70 | HR 81 | Temp 98.1°F | Resp 20 | Wt 255.0 lb

## 2016-05-28 DIAGNOSIS — E785 Hyperlipidemia, unspecified: Secondary | ICD-10-CM

## 2016-05-28 DIAGNOSIS — I10 Essential (primary) hypertension: Secondary | ICD-10-CM | POA: Diagnosis not present

## 2016-05-28 DIAGNOSIS — R972 Elevated prostate specific antigen [PSA]: Secondary | ICD-10-CM

## 2016-05-28 DIAGNOSIS — N182 Chronic kidney disease, stage 2 (mild): Secondary | ICD-10-CM | POA: Diagnosis not present

## 2016-05-28 DIAGNOSIS — Z0001 Encounter for general adult medical examination with abnormal findings: Secondary | ICD-10-CM

## 2016-05-28 DIAGNOSIS — R7309 Other abnormal glucose: Secondary | ICD-10-CM

## 2016-05-28 DIAGNOSIS — E876 Hypokalemia: Secondary | ICD-10-CM

## 2016-05-28 LAB — BASIC METABOLIC PANEL
BUN: 28 mg/dL — ABNORMAL HIGH (ref 6–23)
CO2: 34 meq/L — AB (ref 19–32)
Calcium: 9.1 mg/dL (ref 8.4–10.5)
Chloride: 101 mEq/L (ref 96–112)
Creatinine, Ser: 2.4 mg/dL — ABNORMAL HIGH (ref 0.40–1.50)
GFR: 35.02 mL/min — AB (ref >60.00)
GLUCOSE: 96 mg/dL (ref 70–99)
POTASSIUM: 3.6 meq/L (ref 3.5–5.1)
SODIUM: 139 meq/L (ref 135–145)

## 2016-05-28 LAB — HEPATIC FUNCTION PANEL
ALBUMIN: 4 g/dL (ref 3.5–5.2)
ALK PHOS: 80 U/L (ref 39–117)
ALT: 11 U/L (ref 0–53)
AST: 17 U/L (ref 0–37)
BILIRUBIN DIRECT: 0.1 mg/dL (ref 0.0–0.3)
TOTAL PROTEIN: 7.7 g/dL (ref 6.0–8.3)
Total Bilirubin: 0.8 mg/dL (ref 0.2–1.2)

## 2016-05-28 LAB — LIPID PANEL
CHOLESTEROL: 160 mg/dL (ref 0–200)
HDL: 53.2 mg/dL (ref >39.00)
LDL Cholesterol: 96 mg/dL (ref 0–99)
NonHDL: 107.12
Total CHOL/HDL Ratio: 3
Triglycerides: 56 mg/dL (ref 0.0–149.0)
VLDL: 11.2 mg/dL (ref 0.0–40.0)

## 2016-05-28 NOTE — Assessment & Plan Note (Signed)
Lab Results  Component Value Date   HGBA1C 5.8 11/27/2015   stable overall by history and exam, recent data reviewed with pt, and pt to continue medical treatment as before,  to f/u any worsening symptoms or concerns

## 2016-05-28 NOTE — Assessment & Plan Note (Signed)
stable overall by history and exam, recent data reviewed with pt, and pt to continue medical treatment as before,  to f/u any worsening symptoms or concerns BP Readings from Last 3 Encounters:  05/28/16 126/70  04/29/16 (!) 127/111  02/20/16 133/87

## 2016-05-28 NOTE — Progress Notes (Signed)
Subjective:    Patient ID: [name], male    DOB: 01/04/1951, 65 y.o.   MRN: [id number]  HPI Here to f/u; overall doing ok,  Pt denies chest pain, increasing sob or doe, wheezing, orthopnea, PND, increased LE swelling, palpitations, dizziness or syncope.  Pt denies new neurological symptoms such as new headache, or facial or extremity weakness or numbness.  Pt denies polydipsia, polyuria, or low sugar episode.   Pt denies new neurological symptoms such as new headache, or facial or extremity weakness or numbness.   Pt states overall good compliance with meds, mostly trying to follow appropriate diet, with wt overall stable,  but little exercise however. Still sees renal and urology regularly  Denies urinary symptoms such as dysuria, frequency, urgency, flank pain, hematuria or n/v, fever, chills. UTI nov 23 resolved.  Tolerating increased lipitor.   Past Medical History:  Diagnosis Date  . Anemia    normal Fe, nl B12, nl retic, nl EPO July '13  . Blood transfusion without reported diagnosis   . BPH (benign prostatic hyperplasia)   . Chronic back pain   . Chronic kidney disease    CKD III, obstructive nephropathy  . Diverticulosis   . Dysuria   . Elevated PSA, greater than or equal to 20 ng/ml June '13   PSA 107  . Hemorrhoids, internal, with bleeding, prolapse 09/19/2014  . Hyperlipidemia 05/29/2014  . Hypertension   . Obstructive uropathy 11/27/2015  . Renal insufficiency   . Tuberculosis    h/o PPD +  . UTI (urinary tract infection)    Past Surgical History:  Procedure Laterality Date  . CARPAL TUNNEL RELEASE Right   . COLONOSCOPY    . HEMORRHOID BANDING    . SPLENECTOMY      reports that he has quit smoking. His smoking use included Cigarettes. He has never used smokeless tobacco. He reports that he does not drink alcohol or use drugs. family history includes Diabetes in his mother; Hearing loss in his brother; Stroke in his brother. No Known Allergies Current Outpatient  Prescriptions on File Prior to Visit  Medication Sig Dispense Refill  . acetaminophen (TYLENOL) 325 MG tablet Take 2 tablets (650 mg total) by mouth every 6 (six) hours as needed. (Patient taking differently: Take 650 mg by mouth every 6 (six) hours as needed for mild pain or moderate pain. ) 12 tablet 0  . amLODipine (NORVASC) 5 MG tablet Take 1 tablet (5 mg total) by mouth daily. 90 tablet 2  . aspirin 81 MG tablet Take 81 mg by mouth daily.    Marland Kitchen atorvastatin (LIPITOR) 20 MG tablet TAKE ONE TABLET BY MOUTH ONCE DAILY (Patient taking differently: TAKE 20 MG  BY MOUTH ONCE DAILY) 90 tablet 0  . finasteride (PROSCAR) 5 MG tablet Take 1 tablet (5 mg total) by mouth daily. 90 tablet 2  . gabapentin (NEURONTIN) 300 MG capsule Take 1 capsule (300 mg total) by mouth 3 (three) times daily. 90 capsule 11  . labetalol (NORMODYNE) 200 MG tablet Take 1 tablet (200 mg total) by mouth every morning. 90 tablet 2  . naproxen (NAPROSYN) 500 MG tablet TAKE ONE TABLET BY MOUTH TWICE DAILY (Patient taking differently: TAKE 500 MG BY MOUTH TWICE DAILY AS NEEDED FOR PAIN) 30 tablet 0  . oxybutynin (DITROPAN) 5 MG tablet Take 1 tablet (5 mg total) by mouth daily. (Patient taking differently: Take 5 mg by mouth daily as needed for bladder spasms. ) 90 tablet 3  .  tamsulosin (FLOMAX) 0.4 MG CAPS capsule Take 1 capsule (0.4 mg total) by mouth daily. 90 capsule 2   No current facility-administered medications on file prior to visit.    Review of Systems  Constitutional: Negative for unusual diaphoresis or night sweats HENT: Negative for ear swelling or discharge Eyes: Negative for worsening visual haziness  Respiratory: Negative for choking and stridor.   Gastrointestinal: Negative for distension or worsening eructation Genitourinary: Negative for retention or change in urine volume.  Musculoskeletal: Negative for other MSK pain or swelling Skin: Negative for color change and worsening wound Neurological: Negative for  tremors and numbness other than noted  Psychiatric/Behavioral: Negative for decreased concentration or agitation other than above   All other system neg per pt    Objective:   Physical Exam BP 126/70   Pulse 81   Temp 98.1 F (36.7 C) (Oral)   Resp 20   Wt 255 lb (115.7 kg)   SpO2 96%   BMI 33.64 kg/m  VS noted,  Constitutional: Pt appears in no apparent distress HENT: Head: NCAT.  Right Ear: External ear normal.  Left Ear: External ear normal.  Eyes: . Pupils are equal, round, and reactive to light. Conjunctivae and EOM are normal Neck: Normal range of motion. Neck supple.  Cardiovascular: Normal rate and regular rhythm.   Pulmonary/Chest: Effort normal and breath sounds without rales or wheezing.  Neurological: Pt is alert. Not confused , motor grossly intact Skin: Skin is warm. No rash, no LE edema Psychiatric: Pt behavior is normal. No agitation.  No other new exam findings    Assessment & Plan:

## 2016-05-28 NOTE — Assessment & Plan Note (Signed)
Mild persistent but overall stable,  to f/u any worsening symptoms or concerns  Lab Results  Component Value Date   PSA 10.05 (H) 11/27/2015   PSA 16.56 (H) 06/10/2015   PSA 105.70 (H) 11/24/2011

## 2016-05-28 NOTE — Assessment & Plan Note (Signed)
stable overall by history and exam, recent data reviewed with pt, and pt to continue medical treatment as before,  to f/u any worsening symptoms or concerns Lab Results  Component Value Date   CREATININE 2.40 (H) 05/28/2016   Cont to f/u renal

## 2016-05-28 NOTE — Progress Notes (Signed)
Pre visit review using our clinic review tool, if applicable. No additional management support is needed unless otherwise documented below in the visit note. 

## 2016-05-28 NOTE — Assessment & Plan Note (Signed)
Mild, for f/u K,  to f/u any worsening symptoms or concerns

## 2016-05-28 NOTE — Assessment & Plan Note (Signed)
stable overall by history and exam, recent data reviewed with pt, and pt to continue medical treatment as before,  to f/u any worsening symptoms or concerns Lab Results  Component Value Date   LDLCALC 96 05/28/2016   Improved with increased lipitor

## 2016-05-28 NOTE — Patient Instructions (Signed)
Please continue all other medications as before, and refills have been done if requested.  Please have the pharmacy call with any other refills you may need.  Please continue your efforts at being more active, low cholesterol diet, and weight control.  You are otherwise up to date with prevention measures today.  Please keep your appointments with your specialists as you may have planned  Please return in 6 months, or sooner if needed, with Lab testing done 3-5 days before  

## 2016-06-11 ENCOUNTER — Other Ambulatory Visit: Payer: Self-pay | Admitting: Internal Medicine

## 2016-06-15 DIAGNOSIS — R339 Retention of urine, unspecified: Secondary | ICD-10-CM | POA: Diagnosis not present

## 2016-07-28 DIAGNOSIS — N184 Chronic kidney disease, stage 4 (severe): Secondary | ICD-10-CM | POA: Diagnosis not present

## 2016-07-28 DIAGNOSIS — N2581 Secondary hyperparathyroidism of renal origin: Secondary | ICD-10-CM | POA: Diagnosis not present

## 2016-07-28 DIAGNOSIS — D631 Anemia in chronic kidney disease: Secondary | ICD-10-CM | POA: Diagnosis not present

## 2016-08-03 NOTE — Progress Notes (Signed)
Pre visit review using our clinic review tool, if applicable. No additional management support is needed unless otherwise documented below in the visit note. 

## 2016-08-03 NOTE — Progress Notes (Addendum)
Subjective:   [name] is a 66 y.o. male who presents for an Initial Medicare Annual Wellness Visit.  Review of Systems  No ROS.  Medicare Wellness Visit.   Cardiac Risk Factors include: diabetes mellitus;dyslipidemia;male gender;advanced age (>22men, >47 women);hypertension Sleep patterns: no sleep issues, feels rested on waking and sleeps 6-8 hours nightly.   Home Safety/Smoke Alarms:  Feels safe in home. Smoke alarms in place.   Living environment; residence and Firearm Safety: 1-story house/ trailer, no firearms. Lives with roommate Seat Belt Safety/Bike Helmet: Wears seat belt.   Counseling:   Eye Exam- Last 15 years ago.  Dental- 5 years ago resources given   Male:   CCS-  Last 08/22/14, diverticulosis and hemorrhoids, recall 10 years  PSA-  Lab Results  Component Value Date   PSA 10.05 (H) 11/27/2015   PSA 16.56 (H) 06/10/2015   PSA 105.70 (H) 11/24/2011       Objective:    Today's Vitals   08/04/16 1311 08/04/16 1315  BP: (!) 144/90   Pulse: 69   Resp: 20   SpO2: 99%   Weight: 255 lb (115.7 kg)   Height: 6\' 1"  (1.854 m)   PainSc:  7    Body mass index is 33.64 kg/m.  Current Medications (verified) Outpatient Encounter Prescriptions as of 08/04/2016  Medication Sig  . acetaminophen (TYLENOL) 325 MG tablet Take 2 tablets (650 mg total) by mouth every 6 (six) hours as needed. (Patient taking differently: Take 650 mg by mouth every 6 (six) hours as needed for mild pain or moderate pain. )  . amLODipine (NORVASC) 5 MG tablet Take 1 tablet (5 mg total) by mouth daily.  Marland Kitchen aspirin 81 MG tablet Take 81 mg by mouth daily.  Marland Kitchen atorvastatin (LIPITOR) 20 MG tablet TAKE ONE TABLET BY MOUTH ONCE DAILY (Patient taking differently: TAKE 20 MG  BY MOUTH ONCE DAILY)  . labetalol (NORMODYNE) 200 MG tablet Take 1 tablet (200 mg total) by mouth every morning.  Marland Kitchen oxybutynin (DITROPAN) 5 MG tablet Take 1 tablet (5 mg total) by mouth daily. (Patient taking differently: Take 5  mg by mouth daily as needed for bladder spasms. )  . naproxen (NAPROSYN) 500 MG tablet TAKE ONE TABLET BY MOUTH TWICE DAILY (Patient not taking: Reported on 08/04/2016)  . [DISCONTINUED] finasteride (PROSCAR) 5 MG tablet Take 1 tablet (5 mg total) by mouth daily. (Patient not taking: Reported on 08/04/2016)  . [DISCONTINUED] finasteride (PROSCAR) 5 MG tablet TAKE ONE TABLET BY MOUTH ONCE DAILY (Patient not taking: Reported on 08/04/2016)  . [DISCONTINUED] gabapentin (NEURONTIN) 300 MG capsule Take 1 capsule (300 mg total) by mouth 3 (three) times daily. (Patient not taking: Reported on 08/04/2016)  . [DISCONTINUED] labetalol (NORMODYNE) 200 MG tablet TAKE ONE TABLET BY MOUTH IN THE MORNING  . [DISCONTINUED] tamsulosin (FLOMAX) 0.4 MG CAPS capsule Take 1 capsule (0.4 mg total) by mouth daily. (Patient not taking: Reported on 08/04/2016)   No facility-administered encounter medications on file as of 08/04/2016.     Allergies (verified) Patient has no known allergies.   History: Past Medical History:  Diagnosis Date  . Anemia    normal Fe, nl B12, nl retic, nl EPO July '13  . Blood transfusion without reported diagnosis   . BPH (benign prostatic hyperplasia)   . Chronic back pain   . Chronic kidney disease    CKD III, obstructive nephropathy  . Diverticulosis   . Dysuria   . Elevated PSA, greater than or equal  to 20 ng/ml June '13   PSA 107  . Hemorrhoids, internal, with bleeding, prolapse 09/19/2014  . Hyperlipidemia 05/29/2014  . Hypertension   . Obstructive uropathy 11/27/2015  . Renal insufficiency   . Tuberculosis    h/o PPD +  . UTI (urinary tract infection)    Past Surgical History:  Procedure Laterality Date  . CARPAL TUNNEL RELEASE Right   . COLONOSCOPY    . HEMORRHOID BANDING    . PROSTATE SURGERY    . SPLENECTOMY     Family History  Problem Relation Age of Onset  . Diabetes Mother   . Stroke Brother   . Hearing loss Brother   . Colon cancer Neg Hx   . Rectal cancer  Neg Hx   . Stomach cancer Neg Hx   . Esophageal cancer Neg Hx    Social History   Occupational History  . [employer]    unemployed   Social History Main Topics  . Smoking status: Former Smoker    Types: Cigarettes  . Smokeless tobacco: Never Used     Comment: Quit 1981  . Alcohol use No     Comment: Quit 2004  . Drug use: No  . Sexual activity: No   Tobacco Counseling Counseling given: Not Answered   Activities of Daily Living In your present state of health, do you have any difficulty performing the following activities: 08/04/2016  Hearing? N  Vision? N  Difficulty concentrating or making decisions? N  Walking or climbing stairs? N  Dressing or bathing? N  Doing errands, shopping? N  Preparing Food and eating ? N  Using the Toilet? N  In the past six months, have you accidently leaked urine? Y  Do you have problems with loss of bowel control? N  Managing your Medications? N  Managing your Finances? N  Housekeeping or managing your Housekeeping? N  Some recent data might be hidden    Immunizations and Health Maintenance Immunization History  Administered Date(s) Administered  . Influenza Whole 06/09/1999, 05/18/2007, 03/30/2010  . Influenza, High Dose Seasonal PF 04/08/2016  . Influenza,inj,Quad PF,36+ Mos 03/13/2015  . Influenza-Unspecified 05/07/2014  . Pneumococcal Conjugate-13 11/27/2015  . Tdap 12/04/2013   There are no preventive care reminders to display for this patient.  Patient Care Team: Biagio Borg, MD as PCP - General (Internal Medicine) Jana Half, DPM as Consulting Physician (Podiatry) Nickie Retort, MD as Consulting Physician (Urology)  Indicate any recent Medical Services you may have received from other than [facility] providers in the past year (date may be approximate).    Assessment:   This is a routine wellness examination for [name]. Physical assessment deferred to PCP.   Hearing/Vision screen Hearing  Screening Comments: Able to hear conversational tones w/o difficulty. No issues reported.   Vision Screening Comments: Wears glasses  Dietary issues and exercise activities discussed: Current Exercise Habits: Home exercise routine, Type of exercise: walking, Time (Minutes): 40, Frequency (Times/Week): 6, Weekly Exercise (Minutes/Week): 240, Intensity: Moderate  Diet (meal preparation, eat out, water intake, caffeinated beverages, dairy products, fruits and vegetables): in general, a "healthy" diet  , diabetic, low fat/ cholesterol, low salt  Water 6-8 cups per day.   Goals    . lose 20 pounds          Continue walking, do sit-ups and eat healthy.      Depression Screen PHQ 2/9 Scores 08/04/2016 11/27/2015 05/29/2014 12/04/2013  PHQ - 2 Score 0 0 0  1    Fall Risk Fall Risk  08/04/2016 11/27/2015 05/29/2014 12/04/2013  Falls in the past year? Yes No No No  Number falls in past yr: 1 - - -  Injury with Fall? No - - -    Cognitive Function:       Ad8 score reviewed for issues:  Issues making decisions: no  Less interest in hobbies / activities: no  Repeats questions, stories (family complaining): no  Trouble using ordinary gadgets (microwave, computer, phone): no  Forgets the month or year: no  Mismanaging finances: no  Remembering appts: no  Daily problems with thinking and/or memory: no Ad8 score is= 0     Screening Tests Health Maintenance  Topic Date Due  . PNA vac Low Risk Adult (2 of 2 - PPSV23) 11/26/2016  . TETANUS/TDAP  12/05/2023  . COLONOSCOPY  08/21/2024  . INFLUENZA VACCINE  Addressed  . Hepatitis C Screening  Completed        Plan:    Continue to eat heart healthy diet (full of fruits, vegetables, whole grains, lean protein, water--limit salt, fat, and sugar intake) and increase physical activity as tolerated.  Continue doing brain stimulating activities (puzzles, reading, adult coloring books, staying active) to keep memory sharp.   During  the course of the visit Damonte was educated and counseled about the following appropriate screening and preventive services:   Vaccines to include Pneumoccal, Influenza, Hepatitis B, Td, Zostavax, HCV  Colorectal cancer screening  Cardiovascular disease screening  Diabetes screening  Glaucoma screening  Nutrition counseling  Prostate cancer screening  Patient Instructions (the written plan) were given to the patient.   Michiel Cowboy, RN   08/04/2016    Medical screening examination/treatment/procedure(s) were performed by non-physician practitioner and as supervising physician I was immediately available for consultation/collaboration. I agree with above. Cathlean Cower, MD

## 2016-08-04 ENCOUNTER — Ambulatory Visit (INDEPENDENT_AMBULATORY_CARE_PROVIDER_SITE_OTHER): Payer: Medicare Other | Admitting: *Deleted

## 2016-08-04 VITALS — BP 144/90 | HR 69 | Resp 20 | Ht 73.0 in | Wt 255.0 lb

## 2016-08-04 DIAGNOSIS — Z0000 Encounter for general adult medical examination without abnormal findings: Secondary | ICD-10-CM | POA: Diagnosis not present

## 2016-08-04 NOTE — Patient Instructions (Addendum)
Continue to eat heart healthy diet (full of fruits, vegetables, whole grains, lean protein, water--limit salt, fat, and sugar intake) and increase physical activity as tolerated.  Continue doing brain stimulating activities (puzzles, reading, adult coloring books, staying active) to keep memory sharp.   [name] , Thank you for taking time to come for your Medicare Wellness Visit. I appreciate your ongoing commitment to your health goals. Please review the following plan we discussed and let me know if I can assist you in the future.   These are the goals we discussed: Goals    . lose 20 pounds          Continue walking, do sit-ups and eat healthy.       This is a list of the screening recommended for you and due dates:  Health Maintenance  Topic Date Due  . Pneumonia vaccines (2 of 2 - PPSV23) 11/26/2016  . Tetanus Vaccine  12/05/2023  . Colon Cancer Screening  08/21/2024  . Flu Shot  Addressed  .  Hepatitis C: One time screening is recommended by Center for Disease Control  (CDC) for  adults born from [birth year] through 1965.   Completed        High-Fiber Diet Fiber, also called dietary fiber, is a type of carbohydrate found in fruits, vegetables, whole grains, and beans. A high-fiber diet can have many health benefits. Your health care provider may recommend a high-fiber diet to help:  Prevent constipation. Fiber can make your bowel movements more regular.  Lower your cholesterol.  Relieve hemorrhoids, uncomplicated diverticulosis, or irritable bowel syndrome.  Prevent overeating as part of a weight-loss plan.  Prevent heart disease, type 2 diabetes, and certain cancers. What is my plan? The recommended daily intake of fiber includes:  38 grams for men under age 58.  24 grams for men over age 24.  77 grams for women under age 83.  80 grams for women over age 76. You can get the recommended daily intake of dietary fiber by eating a variety of fruits, vegetables,  grains, and beans. Your health care provider may also recommend a fiber supplement if it is not possible to get enough fiber through your diet. What do I need to know about a high-fiber diet?  Fiber supplements have not been widely studied for their effectiveness, so it is better to get fiber through food sources.  Always check the fiber content on thenutrition facts label of any prepackaged food. Look for foods that contain at least 5 grams of fiber per serving.  Ask your dietitian if you have questions about specific foods that are related to your condition, especially if those foods are not listed in the following section.  Increase your daily fiber consumption gradually. Increasing your intake of dietary fiber too quickly may cause bloating, cramping, or gas.  Drink plenty of water. Water helps you to digest fiber. What foods can I eat? Grains  Whole-grain breads. Multigrain cereal. Oats and oatmeal. Brown rice. Barley. Bulgur wheat. [place]. Bran muffins. Popcorn. Rye wafer crackers. Vegetables  Sweet potatoes. Spinach. Kale. Artichokes. Cabbage. Broccoli. Green peas. Carrots. Squash. Fruits  Berries. Pears. Apples. Oranges. Avocados. Prunes and raisins. Dried figs. Meats and Other Protein Sources  Navy, kidney, pinto, and soy beans. Split peas. Lentils. Nuts and seeds. Dairy  Fiber-fortified yogurt. Beverages  Fiber-fortified soy milk. Fiber-fortified orange juice. Other  Fiber bars. The items listed above may not be a complete list of recommended foods or beverages. Contact your dietitian for  more options.  What foods are not recommended? Grains  White bread. Pasta made with refined flour. White rice. Vegetables  Fried potatoes. Canned vegetables. Well-cooked vegetables. Fruits  Fruit juice. Cooked, strained fruit. Meats and Other Protein Sources  Fatty cuts of meat. Fried Sales executive or fried fish. Dairy  Milk. Yogurt. Cream cheese. Sour cream. Beverages  Soft  drinks. Other  Cakes and pastries. Butter and oils. The items listed above may not be a complete list of foods and beverages to avoid. Contact your dietitian for more information.  What are some tips for including high-fiber foods in my diet?  Eat a wide variety of high-fiber foods.  Make sure that half of all grains consumed each day are whole grains.  Replace breads and cereals made from refined flour or white flour with whole-grain breads and cereals.  Replace white rice with brown rice, bulgur wheat, or millet.  Start the day with a breakfast that is high in fiber, such as a cereal that contains at least 5 grams of fiber per serving.  Use beans in place of meat in soups, salads, or pasta.  Eat high-fiber snacks, such as berries, raw vegetables, nuts, or popcorn. This information is not intended to replace advice given to you by your health care provider. Make sure you discuss any questions you have with your health care provider. Document Released: 05/24/2005 Document Revised: 10/30/2015 Document Reviewed: 11/06/2013 Elsevier Interactive Patient Education  2017 [employer].

## 2016-08-16 ENCOUNTER — Other Ambulatory Visit: Payer: Self-pay | Admitting: Internal Medicine

## 2016-09-04 ENCOUNTER — Encounter (HOSPITAL_COMMUNITY): Payer: Self-pay

## 2016-09-04 ENCOUNTER — Emergency Department (HOSPITAL_COMMUNITY)
Admission: EM | Admit: 2016-09-04 | Discharge: 2016-09-04 | Disposition: A | Discharge status: Home / Self Care | Payer: Medicare Other | Attending: Emergency Medicine | Admitting: Emergency Medicine

## 2016-09-04 DIAGNOSIS — N183 Chronic kidney disease, stage 3 (moderate): Secondary | ICD-10-CM | POA: Insufficient documentation

## 2016-09-04 DIAGNOSIS — R339 Retention of urine, unspecified: Secondary | ICD-10-CM

## 2016-09-04 DIAGNOSIS — N3000 Acute cystitis without hematuria: Secondary | ICD-10-CM | POA: Diagnosis not present

## 2016-09-04 DIAGNOSIS — Z87891 Personal history of nicotine dependence: Secondary | ICD-10-CM | POA: Insufficient documentation

## 2016-09-04 DIAGNOSIS — Z79899 Other long term (current) drug therapy: Secondary | ICD-10-CM | POA: Insufficient documentation

## 2016-09-04 DIAGNOSIS — I129 Hypertensive chronic kidney disease with stage 1 through stage 4 chronic kidney disease, or unspecified chronic kidney disease: Secondary | ICD-10-CM | POA: Diagnosis not present

## 2016-09-04 DIAGNOSIS — Z7982 Long term (current) use of aspirin: Secondary | ICD-10-CM | POA: Diagnosis not present

## 2016-09-04 LAB — URINALYSIS, ROUTINE W REFLEX MICROSCOPIC
Bilirubin Urine: NEGATIVE
GLUCOSE, UA: NEGATIVE mg/dL
KETONES UR: NEGATIVE mg/dL
NITRITE: POSITIVE — AB
PH: 8 (ref 5.0–8.0)
PROTEIN: NEGATIVE mg/dL
Specific Gravity, Urine: 1.011 (ref 1.005–1.030)
Squamous Epithelial / LPF: NONE SEEN

## 2016-09-04 MED ORDER — CEPHALEXIN 500 MG PO CAPS: 1000.0000 mg | ORAL_CAPSULE | Freq: Two times a day (BID) | ORAL | 0 refills | Status: DC

## 2016-09-04 MED ORDER — CEPHALEXIN 500 MG PO CAPS
1000.0000 mg | ORAL_CAPSULE | Freq: Once | ORAL | Status: AC
  Administered 2016-09-04: 1000 mg via ORAL
  Filled 2016-09-04: qty 2

## 2016-09-04 NOTE — ED Triage Notes (Signed)
He c/o his chronic catheter wasn't draining, so he "took it out at home" about an hour p.t.a. He secondarily c/o chronic left neck swelling just below jaw (?lymphadenopathy?) which he states he has had since he pulled his own tooth about five years ago.

## 2016-09-04 NOTE — ED Provider Notes (Signed)
[facility] DEPT Provider Note   CSN: [id number] Arrival date & time: 09/04/16  1351     History   Chief Complaint Chief Complaint  Patient presents with  . Urinary Retention    HPI [name] is a 66 y.o. male.  HPI Patient chronically has indwelling Foley catheter. He reports this morning it stopped draining. He tried to irrigate it twice with no outcome. He reports that because it wasn't draining he removed it himself. He reports he has not been able to pass any urine. He has pressure over his bladder. No other associated symptoms.  As a separate issue patient identifies a nodule under his right mandible is been present for almost 10 years. He reports it used to be very large at a time when he had a dental infection. It never went away completely and he worries as a source of constant infection in his body. It is not tender or has any other associated symptoms. It has not been enlarging. Past Medical History:  Diagnosis Date  . Anemia    normal Fe, nl B12, nl retic, nl EPO July '13  . Blood transfusion without reported diagnosis   . BPH (benign prostatic hyperplasia)   . Chronic back pain   . Chronic kidney disease    CKD III, obstructive nephropathy  . Diverticulosis   . Dysuria   . Elevated PSA, greater than or equal to 20 ng/ml June '13   PSA 107  . Hemorrhoids, internal, with bleeding, prolapse 09/19/2014  . Hyperlipidemia 05/29/2014  . Hypertension   . Obstructive uropathy 11/27/2015  . Renal insufficiency   . Tuberculosis    h/o PPD +  . UTI (urinary tract infection)     Patient Active Problem List   Diagnosis Date Noted  . Hypokalemia 05/28/2016  . Encounter for well adult exam with abnormal findings 11/27/2015  . Obstructive uropathy 11/27/2015  . Elevated PSA 11/27/2015  . Acute pyelonephritis 05/27/2015  . Generalized bloating 05/27/2015  . Constipation 05/27/2015  . Chest pain 05/27/2015  . AKI (acute kidney injury) ([place]) 05/27/2015  . Acute  sinus infection 11/22/2014  . Cough 09/25/2014  . Hemorrhoids, internal, with bleeding, prolapse 09/19/2014  . 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 Hyperlipidemia 05/29/2014  . Lumbar radiculopathy 05/23/2014  . Lumbar stenosis 11/20/2013  . Abnormal glucose 11/06/2013  . Unspecified hereditary and idiopathic peripheral neuropathy 01/22/2013  . 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 Chronic renal insufficiency, stage II (mild) 11/24/2011  . Hematuria 11/24/2011  . Hydronephrosis 11/24/2011  . Anemia 11/24/2011  . BRADYCARDIA 02/05/2010  . TINEA PEDIS 01/29/2010  . 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 PERIPHERAL EDEMA 01/29/2010  . ABNORMAL ELECTROCARDIOGRAM 01/29/2010  . 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  . Osteoarthrosis, generalized, 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  . ONYCHOMYCOSIS, TOENAILS 10/02/2007  . BENIGN PROSTATIC HYPERTROPHY 10/02/2007  . Essential hypertension 03/06/2007    Past Surgical History:  Procedure Laterality Date  . CARPAL TUNNEL RELEASE Right   . COLONOSCOPY    . HEMORRHOID BANDING    . PROSTATE SURGERY    . SPLENECTOMY         Home Medications    Prior to Admission medications   Medication Sig Start Date End Date Taking? Authorizing Provider  acetaminophen (TYLENOL) 325 MG tablet Take 2 tablets (650 mg total) by mouth every 6 (six) hours as needed. Patient taking differently: Take 650 mg by mouth every 6 (six) hours as needed for mild pain or moderate pain.  05/23/15  Varney Biles, MD  amLODipine (NORVASC) 5 MG tablet Take 1 tablet (5 mg total) by mouth daily. 03/23/16   Biagio Borg, MD  aspirin 81 MG tablet Take 81 mg by mouth daily.    Historical Provider, MD  atorvastatin (LIPITOR) 20 MG tablet TAKE ONE TABLET BY MOUTH ONCE DAILY Patient taking differently: TAKE 20 MG  BY MOUTH ONCE DAILY 03/18/16   Biagio Borg, MD  cephALEXin (KEFLEX) 500 MG capsule Take 2 capsules (1,000 mg total) by mouth 2 (two) times daily. 09/04/16   Charlesetta Shanks, MD  labetalol (NORMODYNE) 200 MG tablet Take 1 tablet (200 mg total) by mouth every morning. 03/23/16   Biagio Borg, MD  naproxen (NAPROSYN) 500 MG tablet TAKE ONE TABLET BY MOUTH TWICE DAILY 08/17/16   Biagio Borg, MD  oxybutynin (DITROPAN) 5 MG tablet Take 1 tablet (5 mg total) by mouth daily. Patient taking differently: Take 5 mg by mouth daily as needed for bladder spasms.  05/29/14   Biagio Borg, MD    Family History Family History  Problem Relation Age of Onset  . Diabetes Mother   . Stroke Brother   . Hearing loss Brother   . Colon cancer Neg Hx   . Rectal cancer Neg Hx   . Stomach cancer Neg Hx   . Esophageal cancer Neg Hx     Social History Social History  Substance Use Topics  . Smoking status: Former Smoker    Types: Cigarettes  . Smokeless tobacco: Never Used     Comment: Quit 1981  . Alcohol use No     Comment: Quit 2004     Allergies   Patient has no known allergies.   Review of Systems Review of Systems 10 Systems reviewed and are negative for acute change except as noted in the HPI.   Physical Exam Updated Vital Signs BP 139/85 (BP Location: Left Arm)   Pulse 60   Temp 98.6 F (37 C)   Resp 12   SpO2 98%   Physical Exam  Constitutional: He is oriented to person, place, and time. He appears well-developed and well-nourished. No distress.  HENT:  Head: Normocephalic and atraumatic.  1.5 cm smooth nodule in the right submandibular space at about the angle of the mandible. Nontender. Has smooth margins. It is not adherent to the mandible. No overlying skin changes.  Eyes: Conjunctivae and EOM are normal.  Neck: Neck supple.  Cardiovascular: Normal rate and regular rhythm.   No murmur heard. Pulmonary/Chest: Effort normal and breath sounds normal. No respiratory distress.  Abdominal: Soft. There is tenderness.  Suprapubic tenderness but do not appreciate firm mass.  Genitourinary: Penis normal.  Musculoskeletal: He exhibits edema.    Trace edema and feet and skin thinning consistent with chronic venous stasis.  Neurological: He is alert and oriented to person, place, and time. No cranial nerve deficit. He exhibits normal muscle tone. Coordination normal.  Skin: Skin is warm and dry.  Psychiatric: He has a normal mood and affect.  Nursing note and vitals reviewed.    ED Treatments / Results  Labs (all labs ordered are listed, but only abnormal results are displayed) Labs Reviewed  URINALYSIS, ROUTINE W REFLEX MICROSCOPIC - Abnormal; Notable for the following:       Result Value   APPearance HAZY (*)    Hgb urine dipstick SMALL (*)    Nitrite POSITIVE (*)    Leukocytes, UA LARGE (*)    Bacteria, UA MANY (*)  All other components within normal limits  URINE CULTURE    EKG  EKG Interpretation None       Radiology No results found.  Procedures Procedures (including critical care time)  Medications Ordered in ED Medications  cephALEXin (KEFLEX) capsule 1,000 mg (not administered)     Initial Impression / Assessment and Plan / ED Course  I have reviewed the triage vital signs and the nursing notes.  Pertinent labs & imaging results that were available during my care of the patient were reviewed by me and considered in my medical decision making (see chart for details).     Final Clinical Impressions(s) / ED Diagnoses   Final diagnoses:  Urinary retention  Acute cystitis without hematuria   Patient much relieved with placement of Foley catheter. Follow-up instructions are given. Patient will follow up with ENT for long-standing neck nodule. No acute findings in association with this. Patient's urinalysis is grossly positive nitrite and WBCs too numerous to count. Will initiate antibiotics and obtain culture. No signs of associated constitutional illness. New Prescriptions New Prescriptions   CEPHALEXIN (KEFLEX) 500 MG CAPSULE    Take 2 capsules (1,000 mg total) by mouth 2 (two) times daily.      Charlesetta Shanks, MD 09/04/16 [phone]

## 2016-09-04 NOTE — ED Notes (Signed)
Urine cloudy with foul odor

## 2016-09-04 NOTE — ED Notes (Signed)
Bladder scan 200 ml 

## 2016-09-06 LAB — URINE CULTURE: Culture: >=100000 — AB

## 2016-09-07 ENCOUNTER — Telehealth: Payer: Self-pay | Admitting: Emergency Medicine

## 2016-09-07 NOTE — Telephone Encounter (Signed)
Post ED Visit - Positive Culture Follow-up  Culture report reviewed by antimicrobial stewardship pharmacist:  []  Elenor Quinones, Pharm.D. []  Heide Guile, Pharm.D., BCPS AQ-ID []  Parks Neptune, Pharm.D., BCPS []  Alycia Rossetti, Pharm.D., BCPS []  [place], Pharm.D., BCPS, AAHIVP []  Legrand Como, Pharm.D., BCPS, AAHIVP []  Salome Arnt, PharmD, BCPS []  Dimitri Ped, PharmD, BCPS []  Vincenza Hews, PharmD, BCPS  Positive urine culture Treated with cephalexin organism colonized per Alyse Low PA advised to stop cephalexin, patient states antibiotic x 2 days seemed to make him feel better, advised per PA recommendations  Hazle Nordmann 09/07/2016, 2:39 PM

## 2016-10-01 ENCOUNTER — Other Ambulatory Visit: Payer: Self-pay | Admitting: Internal Medicine

## 2016-10-12 ENCOUNTER — Ambulatory Visit (INDEPENDENT_AMBULATORY_CARE_PROVIDER_SITE_OTHER): Payer: Medicare Other | Admitting: Internal Medicine

## 2016-10-12 ENCOUNTER — Other Ambulatory Visit (INDEPENDENT_AMBULATORY_CARE_PROVIDER_SITE_OTHER): Payer: Medicare Other

## 2016-10-12 ENCOUNTER — Ambulatory Visit (INDEPENDENT_AMBULATORY_CARE_PROVIDER_SITE_OTHER)
Admission: RE | Admit: 2016-10-12 | Discharge: 2016-10-12 | Disposition: A | Discharge status: Home / Self Care | Payer: Medicare Other | Source: Ambulatory Visit | Attending: Internal Medicine | Admitting: Internal Medicine

## 2016-10-12 ENCOUNTER — Encounter: Payer: Self-pay | Admitting: Internal Medicine

## 2016-10-12 DIAGNOSIS — I10 Essential (primary) hypertension: Secondary | ICD-10-CM

## 2016-10-12 DIAGNOSIS — N182 Chronic kidney disease, stage 2 (mild): Secondary | ICD-10-CM | POA: Diagnosis not present

## 2016-10-12 DIAGNOSIS — M25561 Pain in right knee: Secondary | ICD-10-CM

## 2016-10-12 DIAGNOSIS — M25569 Pain in unspecified knee: Secondary | ICD-10-CM | POA: Insufficient documentation

## 2016-10-12 DIAGNOSIS — M1711 Unilateral primary osteoarthritis, right knee: Secondary | ICD-10-CM | POA: Diagnosis not present

## 2016-10-12 LAB — BASIC METABOLIC PANEL
BUN: 37 mg/dL — ABNORMAL HIGH (ref 6–23)
CALCIUM: 9.3 mg/dL (ref 8.4–10.5)
CO2: 33 mEq/L — ABNORMAL HIGH (ref 19–32)
Chloride: 100 mEq/L (ref 96–112)
Creatinine, Ser: 2.41 mg/dL — ABNORMAL HIGH (ref 0.40–1.50)
GFR: 34.81 mL/min — AB (ref >60.00)
Glucose, Bld: 81 mg/dL (ref 70–99)
Potassium: 3.6 mEq/L (ref 3.5–5.1)
SODIUM: 139 meq/L (ref 135–145)

## 2016-10-12 LAB — TSH: TSH: 1.44 u[IU]/mL (ref 0.35–4.50)

## 2016-10-12 LAB — SEDIMENTATION RATE: Sed Rate: 22 mm/hr — ABNORMAL HIGH (ref 0–20)

## 2016-10-12 LAB — URIC ACID: URIC ACID, SERUM: 10.5 mg/dL — AB (ref 4.0–7.8)

## 2016-10-12 MED ORDER — ACETAMINOPHEN-CODEINE 300-30 MG PO TABS: 0.5000 | ORAL_TABLET | Freq: Four times a day (QID) | ORAL | 0 refills | Status: DC | PRN

## 2016-10-12 MED ORDER — METHYLPREDNISOLONE 4 MG PO TBPK: ORAL_TABLET | ORAL | 0 refills | Status: DC

## 2016-10-12 MED ORDER — METHYLPREDNISOLONE ACETATE 80 MG/ML IJ SUSP
80.0000 mg | Freq: Once | INTRAMUSCULAR | Status: AC
  Administered 2016-10-12: 80 mg via INTRA_ARTICULAR

## 2016-10-12 MED ORDER — AMLODIPINE BESYLATE 5 MG PO TABS: 5.0000 mg | ORAL_TABLET | Freq: Every day | ORAL | 2 refills | Status: DC

## 2016-10-12 NOTE — Addendum Note (Signed)
Addended by: Karren Cobble on: 10/12/2016 03:19 PM   Modules accepted: Orders

## 2016-10-12 NOTE — Patient Instructions (Addendum)
Dr Jenny Reichmann in 1 week   Postprocedure instructions :    A Band-Aid should be left on for 12 hours. Injection therapy is not a cure itself. It is used in conjunction with other modalities. You can use nonsteroidal anti-inflammatories like ibuprofen , hot and cold compresses. Rest is recommended in the next 24 hours. You need to report immediately  if fever, chills or any signs of infection develop.

## 2016-10-12 NOTE — Progress Notes (Signed)
Pre visit review using our clinic review tool, if applicable. No additional management support is needed unless otherwise documented below in the visit note. 

## 2016-10-12 NOTE — Assessment & Plan Note (Addendum)
R knee pain ?etiology See procedure  Medrol dose pack T#3 Labs

## 2016-10-12 NOTE — Assessment & Plan Note (Signed)
Labs D/c Aleve  

## 2016-10-12 NOTE — Assessment & Plan Note (Signed)
BP Readings from Last 3 Encounters:  10/12/16 112/70  09/04/16 140/82  08/04/16 (!) 144/90

## 2016-10-12 NOTE — Progress Notes (Signed)
Subjective:  Patient ID: [name], male    DOB: 03/04/1951  Age: 66 y.o. MRN: [id number]  CC: No chief complaint on file.   HPI [name] presents for R knee pain and and swelling x 1 week+  Outpatient Medications Prior to Visit  Medication Sig Dispense Refill  . acetaminophen (TYLENOL) 325 MG tablet Take 2 tablets (650 mg total) by mouth every 6 (six) hours as needed. (Patient taking differently: Take 650 mg by mouth every 6 (six) hours as needed for mild pain or moderate pain. ) 12 tablet 0  . aspirin 81 MG tablet Take 81 mg by mouth daily.    Marland Kitchen atorvastatin (LIPITOR) 20 MG tablet Take 1 tablet (20 mg total) by mouth daily. 90 tablet 2  . cephALEXin (KEFLEX) 500 MG capsule Take 2 capsules (1,000 mg total) by mouth 2 (two) times daily. 28 capsule 0  . labetalol (NORMODYNE) 200 MG tablet Take 1 tablet (200 mg total) by mouth every morning. 90 tablet 2  . oxybutynin (DITROPAN) 5 MG tablet Take 1 tablet (5 mg total) by mouth daily. (Patient taking differently: Take 5 mg by mouth daily as needed for bladder spasms. ) 90 tablet 3  . amLODipine (NORVASC) 5 MG tablet Take 1 tablet (5 mg total) by mouth daily. (Patient not taking: Reported on 10/12/2016) 90 tablet 2  . amLODipine (NORVASC) 5 MG tablet TAKE ONE TABLET BY MOUTH ONCE DAILY 90 tablet 2  . naproxen (NAPROSYN) 500 MG tablet TAKE ONE TABLET BY MOUTH TWICE DAILY (Patient not taking: Reported on 10/12/2016) 60 tablet 2   No facility-administered medications prior to visit.     ROS Review of Systems  Constitutional: Negative for appetite change, fatigue and unexpected weight change.  HENT: Negative for congestion, nosebleeds, sneezing, sore throat and trouble swallowing.   Eyes: Negative for itching and visual disturbance.  Respiratory: Negative for cough.   Cardiovascular: Negative for chest pain, palpitations and leg swelling.  Gastrointestinal: Negative for abdominal distention, blood in stool, diarrhea and nausea.    Genitourinary: Negative for frequency and hematuria.  Musculoskeletal: Positive for arthralgias and gait problem. Negative for back pain, joint swelling and neck pain.  Skin: Negative for rash.  Neurological: Negative for dizziness, tremors, speech difficulty and weakness.  Psychiatric/Behavioral: Negative for agitation, dysphoric mood and sleep disturbance. The patient is not nervous/anxious.     Objective:  BP 112/70 (BP Location: Left Arm, Patient Position: Sitting, Cuff Size: Large)   Pulse 62   Temp 97.8 F (36.6 C) (Oral)   Ht 6\' 1"  (1.854 m)   Wt 254 lb 1.9 oz (115.3 kg)   SpO2 99%   BMI 33.53 kg/m   BP Readings from Last 3 Encounters:  10/12/16 112/70  09/04/16 140/82  08/04/16 (!) 144/90    Wt Readings from Last 3 Encounters:  10/12/16 254 lb 1.9 oz (115.3 kg)  08/04/16 255 lb (115.7 kg)  05/28/16 255 lb (115.7 kg)    Physical Exam  Constitutional: He is oriented to person, place, and time. He appears well-developed. No distress.  NAD  HENT:  Mouth/Throat: Oropharynx is clear and moist.  Eyes: Conjunctivae are normal. Pupils are equal, round, and reactive to light.  Neck: Normal range of motion. No JVD present. No thyromegaly present.  Cardiovascular: Normal rate, regular rhythm, normal heart sounds and intact distal pulses.  Exam reveals no gallop and no friction rub.   No murmur heard. Pulmonary/Chest: Effort normal and breath sounds normal. No respiratory distress. He  has no wheezes. He has no rales. He exhibits no tenderness.  Abdominal: Soft. Bowel sounds are normal. He exhibits no distension and no mass. There is no tenderness. There is no rebound and no guarding.  Musculoskeletal: Normal range of motion. He exhibits edema and tenderness.  Lymphadenopathy:    He has no cervical adenopathy.  Neurological: He is alert and oriented to person, place, and time. He has normal reflexes. No cranial nerve deficit. He exhibits normal muscle tone. He displays a  negative Romberg sign. Coordination and gait normal.  Skin: Skin is warm and dry. No rash noted. No erythema.  Psychiatric: He has a normal mood and affect. His behavior is normal. Judgment and thought content normal.   R knee is a little swollen and very tender B trace ankle edema    Options to treat discussed. See Meds. I was asked to tap the knee and to inject it w/steroids   Procedure Note :     Procedure : Joint Injection, R  knee   Indication:  Joint osteoarthritis with refractory  chronic pain.   Risks including unsuccessful procedure , bleeding, infection, bruising, skin atrophy, "steroid flare-up" and others were explained to the patient in detail as well as the benefits. Informed consent was obtained.  Tthe patient was placed in a comfortable position. Lateral approach was used. Skin was prepped with Betadine and alcohol  and anesthetized a cooling spray. Then, a 5 cc syringe with a 1.5 inch long 25-gauge needle was used for a joint injection.. The needle was advanced  Into the knee joint cavity. I aspirated a small amount of intra-articular fluid to confirm correct placement of the needle and injected the joint with 3 mL of 2% lidocaine and 80 mg of Depo-Medrol .  Band-Aid was applied.   Tolerated well. Complications: None. Some pain relief following the procedure.   Postprocedure instructions :    A Band-Aid should be left on for 12 hours. Injection therapy is not a cure itself. It is used in conjunction with other modalities. You can use nonsteroidal anti-inflammatories like ibuprofen , hot and cold compresses. Rest is recommended in the next 24 hours. You need to report immediately  if fever, chills or any signs of infection develop.      Lab Results  Component Value Date   WBC 5.8 11/27/2015   HGB 13.8 11/27/2015   HCT 41.4 11/27/2015   PLT 255.0 11/27/2015   GLUCOSE 96 05/28/2016   CHOL 160 05/28/2016   TRIG 56.0 05/28/2016   HDL 53.20 05/28/2016   LDLCALC 96  05/28/2016   ALT 11 05/28/2016   AST 17 05/28/2016   NA 139 05/28/2016   K 3.6 05/28/2016   CL 101 05/28/2016   CREATININE 2.40 (H) 05/28/2016   BUN 28 (H) 05/28/2016   CO2 34 (H) 05/28/2016   TSH 1.57 11/27/2015   PSA 10.05 (H) 11/27/2015   INR 1.08 05/12/2012   HGBA1C 5.8 11/27/2015    No results found.  Assessment & Plan:   There are no diagnoses linked to this encounter. I have discontinued Mr. Primo amLODipine, naproxen, and amLODipine. I am also having him maintain his aspirin, oxybutynin, acetaminophen, labetalol, cephALEXin, and atorvastatin.  No orders of the defined types were placed in this encounter.    Follow-up: No Follow-up on file.  Walker Kehr, MD

## 2016-10-13 LAB — RHEUMATOID FACTOR

## 2016-10-20 ENCOUNTER — Other Ambulatory Visit: Payer: Self-pay | Admitting: Internal Medicine

## 2016-10-20 ENCOUNTER — Other Ambulatory Visit (INDEPENDENT_AMBULATORY_CARE_PROVIDER_SITE_OTHER): Payer: Medicare Other

## 2016-10-20 ENCOUNTER — Encounter: Payer: Self-pay | Admitting: Internal Medicine

## 2016-10-20 ENCOUNTER — Ambulatory Visit (INDEPENDENT_AMBULATORY_CARE_PROVIDER_SITE_OTHER): Payer: Medicare Other | Admitting: Internal Medicine

## 2016-10-20 VITALS — BP 108/62 | HR 66 | Temp 98.0°F | Resp 18 | Ht 73.0 in | Wt 255.4 lb

## 2016-10-20 DIAGNOSIS — R972 Elevated prostate specific antigen [PSA]: Secondary | ICD-10-CM

## 2016-10-20 DIAGNOSIS — N183 Chronic kidney disease, stage 3 unspecified: Secondary | ICD-10-CM

## 2016-10-20 DIAGNOSIS — M109 Gout, unspecified: Secondary | ICD-10-CM | POA: Insufficient documentation

## 2016-10-20 DIAGNOSIS — R221 Localized swelling, mass and lump, neck: Secondary | ICD-10-CM | POA: Diagnosis not present

## 2016-10-20 DIAGNOSIS — Z0001 Encounter for general adult medical examination with abnormal findings: Secondary | ICD-10-CM

## 2016-10-20 DIAGNOSIS — E785 Hyperlipidemia, unspecified: Secondary | ICD-10-CM

## 2016-10-20 DIAGNOSIS — M159 Polyosteoarthritis, unspecified: Secondary | ICD-10-CM | POA: Diagnosis not present

## 2016-10-20 DIAGNOSIS — Z23 Encounter for immunization: Secondary | ICD-10-CM | POA: Diagnosis not present

## 2016-10-20 LAB — URINALYSIS, ROUTINE W REFLEX MICROSCOPIC
BILIRUBIN URINE: NEGATIVE
KETONES UR: NEGATIVE
NITRITE: NEGATIVE
Specific Gravity, Urine: <=1.005 — AB (ref 1.000–1.030)
URINE GLUCOSE: NEGATIVE
Urobilinogen, UA: 0.2 (ref 0.0–1.0)
pH: >=9 — AB (ref 5.0–8.0)

## 2016-10-20 LAB — BASIC METABOLIC PANEL
BUN: 39 mg/dL — ABNORMAL HIGH (ref 6–23)
CHLORIDE: 99 meq/L (ref 96–112)
CO2: 34 meq/L — AB (ref 19–32)
Calcium: 8.9 mg/dL (ref 8.4–10.5)
Creatinine, Ser: 2.44 mg/dL — ABNORMAL HIGH (ref 0.40–1.50)
GFR: 34.31 mL/min — ABNORMAL LOW (ref >60.00)
Glucose, Bld: 86 mg/dL (ref 70–99)
POTASSIUM: 3.8 meq/L (ref 3.5–5.1)
Sodium: 139 mEq/L (ref 135–145)

## 2016-10-20 LAB — HEPATIC FUNCTION PANEL
ALK PHOS: 90 U/L (ref 39–117)
ALT: 15 U/L (ref 0–53)
AST: 13 U/L (ref 0–37)
Albumin: 3.9 g/dL (ref 3.5–5.2)
Bilirubin, Direct: 0.1 mg/dL (ref 0.0–0.3)
Total Bilirubin: 0.6 mg/dL (ref 0.2–1.2)
Total Protein: 7 g/dL (ref 6.0–8.3)

## 2016-10-20 LAB — TSH: TSH: 2.35 u[IU]/mL (ref 0.35–4.50)

## 2016-10-20 LAB — LIPID PANEL
CHOL/HDL RATIO: 2
Cholesterol: 127 mg/dL (ref 0–200)
HDL: 55 mg/dL (ref >39.00)
LDL Cholesterol: 64 mg/dL (ref 0–99)
NONHDL: 72.32
Triglycerides: 43 mg/dL (ref 0.0–149.0)
VLDL: 8.6 mg/dL (ref 0.0–40.0)

## 2016-10-20 LAB — CBC WITH DIFFERENTIAL/PLATELET
Basophils Absolute: 0.2 10*3/uL — ABNORMAL HIGH (ref 0.0–0.1)
Basophils Relative: 2 % (ref 0.0–3.0)
EOS PCT: 4 % (ref 0.0–5.0)
Eosinophils Absolute: 0.4 10*3/uL (ref 0.0–0.7)
HEMATOCRIT: 40.4 % (ref 39.0–52.0)
HEMOGLOBIN: 13.1 g/dL (ref 13.0–17.0)
LYMPHS PCT: 38.3 % (ref 12.0–46.0)
Lymphs Abs: 3.5 10*3/uL (ref 0.7–4.0)
MCHC: 32.4 g/dL (ref 30.0–36.0)
MCV: 86.2 fl (ref 78.0–100.0)
Monocytes Absolute: 0.9 10*3/uL (ref 0.1–1.0)
Monocytes Relative: 10.4 % (ref 3.0–12.0)
Neutro Abs: 4.1 10*3/uL (ref 1.4–7.7)
Neutrophils Relative %: 45.3 % (ref 43.0–77.0)
Platelets: 316 10*3/uL (ref 150.0–400.0)
RBC: 4.69 Mil/uL (ref 4.22–5.81)
RDW: 14.7 % (ref 11.5–15.5)
WBC: 9 10*3/uL (ref 4.0–10.5)

## 2016-10-20 LAB — PSA: PSA: 45.5 ng/mL — AB (ref 0.10–4.00)

## 2016-10-20 MED ORDER — ALLOPURINOL 100 MG PO TABS: 100.0000 mg | ORAL_TABLET | Freq: Every day | ORAL | 3 refills | Status: DC

## 2016-10-20 MED ORDER — ATORVASTATIN CALCIUM 40 MG PO TABS: 40.0000 mg | ORAL_TABLET | Freq: Every day | ORAL | 3 refills | Status: DC

## 2016-10-20 NOTE — Progress Notes (Signed)
Subjective:    Patient ID: [name], male    DOB: 1950/09/19, 66 y.o.   MRN: [id number]  HPI   Here for wellness and f/u;  Overall doing ok;  Pt denies Chest pain, worsening SOB, DOE, wheezing, orthopnea, PND, worsening LE edema, palpitations, dizziness or syncope.  Pt denies neurological change such as new headache, facial or extremity weakness.  Pt denies polydipsia, polyuria, or low sugar symptoms. Pt states overall good compliance with treatment and medications, good tolerability, and has been trying to follow appropriate diet.  Pt denies worsening depressive symptoms, suicidal ideation or panic. No fever, night sweats, wt loss, loss of appetite, or other constitutional symptoms.  Pt states good ability with ADL's, has low fall risk, home safety reviewed and adequate, no other significant changes in hearing or vision, and only occasionally active with exercise.   Was seen recently per Dr Alain Marion with acute gout now improved. But still with significant pain to multiiple joints mostly bilat ankles and knees.  Has hx of CKD, needs better lipid control, has planned f/u every 6 mo with renal.  Right neck mass has been stable in size and NT per pt.   Past Medical History:  Diagnosis Date  . Anemia    normal Fe, nl B12, nl retic, nl EPO July '13  . Blood transfusion without reported diagnosis   . BPH (benign prostatic hyperplasia)   . Chronic back pain   . Chronic kidney disease    CKD III, obstructive nephropathy  . Diverticulosis   . Dysuria   . Elevated PSA, greater than or equal to 20 ng/ml June '13   PSA 107  . Hemorrhoids, internal, with bleeding, prolapse 09/19/2014  . Hyperlipidemia 05/29/2014  . Hypertension   . Obstructive uropathy 11/27/2015  . Renal insufficiency   . Tuberculosis    h/o PPD +  . UTI (urinary tract infection)    Past Surgical History:  Procedure Laterality Date  . CARPAL TUNNEL RELEASE Right   . COLONOSCOPY    . HEMORRHOID BANDING    . PROSTATE SURGERY     . SPLENECTOMY      reports that he has quit smoking. His smoking use included Cigarettes. He has never used smokeless tobacco. He reports that he does not drink alcohol or use drugs. family history includes Diabetes in his mother; Hearing loss in his brother; Stroke in his brother. No Known Allergies Current Outpatient Prescriptions on File Prior to Visit  Medication Sig Dispense Refill  . acetaminophen (TYLENOL) 325 MG tablet Take 2 tablets (650 mg total) by mouth every 6 (six) hours as needed. (Patient taking differently: Take 650 mg by mouth every 6 (six) hours as needed for mild pain or moderate pain. ) 12 tablet 0  . Acetaminophen-Codeine (TYLENOL/CODEINE #3) 300-30 MG tablet Take 0.5-1 tablets by mouth every 6 (six) hours as needed for pain. 30 tablet 0  . amLODipine (NORVASC) 5 MG tablet Take 1 tablet (5 mg total) by mouth daily. 90 tablet 2  . aspirin 81 MG tablet Take 81 mg by mouth daily.    Marland Kitchen labetalol (NORMODYNE) 200 MG tablet Take 1 tablet (200 mg total) by mouth every morning. 90 tablet 2  . methylPREDNISolone (MEDROL DOSEPAK) 4 MG TBPK tablet As directed 21 tablet 0  . oxybutynin (DITROPAN) 5 MG tablet Take 1 tablet (5 mg total) by mouth daily. (Patient taking differently: Take 5 mg by mouth daily as needed for bladder spasms. ) 90 tablet 3  No current facility-administered medications on file prior to visit.    Review of Systems Constitutional: Negative for other unusual diaphoresis, sweats, appetite or weight changes HENT: Negative for other worsening hearing loss, ear pain, facial swelling, mouth sores or neck stiffness.   Eyes: Negative for other worsening pain, redness or other visual disturbance.  Respiratory: Negative for other stridor or swelling Cardiovascular: Negative for other palpitations or other chest pain  Gastrointestinal: Negative for worsening diarrhea or loose stools, blood in stool, distention or other pain Genitourinary: Negative for hematuria, flank  pain or other change in urine volume.  Musculoskeletal: Negative for myalgias or other joint swelling.  Skin: Negative for other color change, or other wound or worsening drainage.  Neurological: Negative for other syncope or numbness. Hematological: Negative for other adenopathy or swelling Psychiatric/Behavioral: Negative for hallucinations, other worsening agitation, SI, self-injury, or new decreased concentration All other system neg per pt    Objective:   Physical Exam BP 108/62   Pulse 66   Temp 98 F (36.7 C)   Resp 18   Ht 6\' 1"  (1.854 m)   Wt 255 lb 6.4 oz (115.8 kg)   SpO2 96%   BMI 33.70 kg/m  VS noted,  Constitutional: Pt is oriented to person, place, and time. Appears well-developed and well-nourished, in no significant distress and comfortable Head: Normocephalic and atraumatic  Eyes: Conjunctivae and EOM are normal. Pupils are equal, round, and reactive to light Right Ear: External ear normal without discharge Left Ear: External ear normal without discharge Nose: Nose without discharge or deformity Mouth/Throat: Oropharynx is without other ulcerations and moist  Neck: Normal range of motion. Neck supple. No JVD present. No tracheal deviation present or significant neck LA or mass except for chronic stable right submandib mass NT per pt Cardiovascular: Normal rate, regular rhythm, normal heart sounds and intact distal pulses.   Pulmonary/Chest: WOB normal and breath sounds without rales or wheezing  Abdominal: Soft. Bowel sounds are normal. NT. No HSM  Musculoskeletal: Normal range of motion. Exhibits no edema, no joint effusions and NT Lymphadenopathy: Has no other cervical adenopathy.  Neurological: Pt is alert and oriented to person, place, and time. Pt has normal reflexes. No cranial nerve deficit. Motor grossly intact, Gait intact Skin: Skin is warm and dry. No rash noted or new ulcerations Psychiatric:  Has normal mood and affect. Behavior is normal without  agitation No other exam findings Lab Results  Component Value Date   WBC 9.0 10/20/2016   HGB 13.1 10/20/2016   HCT 40.4 10/20/2016   PLT 316.0 10/20/2016   GLUCOSE 86 10/20/2016   CHOL 127 10/20/2016   TRIG 43.0 10/20/2016   HDL 55.00 10/20/2016   LDLCALC 64 10/20/2016   ALT 15 10/20/2016   AST 13 10/20/2016   NA 139 10/20/2016   K 3.8 10/20/2016   CL 99 10/20/2016   CREATININE 2.44 (H) 10/20/2016   BUN 39 (H) 10/20/2016   CO2 34 (H) 10/20/2016   TSH 2.35 10/20/2016   PSA 45.50 (H) 10/20/2016   INR 1.08 05/12/2012   HGBA1C 5.8 11/27/2015       Assessment & Plan:

## 2016-10-20 NOTE — Patient Instructions (Addendum)
You had the Pneumovax pneumonia shot today  OK to increase the lipitor to 40 mg per day  Please take all new medication as prescribed - the allopurinol  Please continue all other medications as before, and refills have been done if requested.  Please have the pharmacy call with any other refills you may need.  Please continue your efforts at being more active, low cholesterol diet, and weight control.  You are otherwise up to date with prevention measures today.  You will be contacted regarding the referral for: Dr Smith/sports medicine for the knees and ankles (or you can make an appt as you leave today at the checkout desk)  Please keep your appointments with your specialists as you may have planned - kidney doctor every 6 months  Please remember to call for your yearly eye doctor appointment as well  Please go to the LAB in the Basement (turn left off the elevator) for the tests to be done today  You will be contacted by phone if any changes need to be made immediately.  Otherwise, you will receive a letter about your results with an explanation, but please check with MyChart first.  Please remember to sign up for MyChart if you have not done so, as this will be important to you in the future with finding out test results, communicating by private email, and scheduling acute appointments online when needed.  Please return in 6 months, or sooner if needed

## 2016-10-21 ENCOUNTER — Telehealth: Payer: Self-pay

## 2016-10-21 NOTE — Telephone Encounter (Signed)
-----   Message from Biagio Borg, MD sent at 10/20/2016  6:42 PM EDT ----- Left message on MyChart, pt to cont same tx, except  The test results show that your current treatment is OK, except the PSA is quite a bit elevated today compared to the last.  You have seen [facility] in the past for urinary retention.  We will need to have you return to address the elevated PSA as well.  You should hear from the office about this as well.     Deirdra Heumann to please inform pt, I will do referral

## 2016-10-21 NOTE — Telephone Encounter (Signed)
Pt has been informed and expressed understanding.  

## 2016-10-23 NOTE — Assessment & Plan Note (Signed)

## 2016-10-23 NOTE — Assessment & Plan Note (Signed)
Joint symptoms of acute gout improved, but still OA pain - for sport med referral

## 2016-10-23 NOTE — Assessment & Plan Note (Signed)
with marked elevated uric acid, for start allopurinol 100 qd preventive

## 2016-10-23 NOTE — Assessment & Plan Note (Addendum)
With goal ldl < 70 - for increased lipitor to 40 qd  In addition to the time spent performing CPE, I spent an additional 25 minutes face to face,in which greater than 50% of this time was spent in counseling and coordination of care for patient's acute illness as documented, including the differential diagnosis, evaluation and management options for HLD, right neck mass, Gout, OA multiple joints and CKD

## 2016-10-23 NOTE — Assessment & Plan Note (Signed)
Chronic stable, declines ENT referral

## 2016-10-23 NOTE — Assessment & Plan Note (Signed)
To cont renal f/u as planned 

## 2016-10-24 ENCOUNTER — Observation Stay (HOSPITAL_COMMUNITY)
Admission: EM | Admit: 2016-10-24 | Discharge: 2016-10-26 | Disposition: A | Discharge status: Home / Self Care | Payer: Medicare Other | Attending: Internal Medicine | Admitting: Internal Medicine

## 2016-10-24 ENCOUNTER — Emergency Department (HOSPITAL_COMMUNITY): Payer: Medicare Other

## 2016-10-24 ENCOUNTER — Observation Stay (HOSPITAL_COMMUNITY): Payer: Medicare Other

## 2016-10-24 ENCOUNTER — Observation Stay (HOSPITAL_BASED_OUTPATIENT_CLINIC_OR_DEPARTMENT_OTHER): Payer: Medicare Other

## 2016-10-24 ENCOUNTER — Emergency Department (HOSPITAL_BASED_OUTPATIENT_CLINIC_OR_DEPARTMENT_OTHER)
Admit: 2016-10-24 | Discharge: 2016-10-24 | Disposition: A | Discharge status: Home / Self Care | Payer: Medicare Other | Attending: Emergency Medicine | Admitting: Emergency Medicine

## 2016-10-24 DIAGNOSIS — E1122 Type 2 diabetes mellitus with diabetic chronic kidney disease: Secondary | ICD-10-CM | POA: Diagnosis not present

## 2016-10-24 DIAGNOSIS — M25461 Effusion, right knee: Secondary | ICD-10-CM | POA: Insufficient documentation

## 2016-10-24 DIAGNOSIS — N401 Enlarged prostate with lower urinary tract symptoms: Secondary | ICD-10-CM | POA: Diagnosis not present

## 2016-10-24 DIAGNOSIS — Z7982 Long term (current) use of aspirin: Secondary | ICD-10-CM | POA: Diagnosis not present

## 2016-10-24 DIAGNOSIS — I2510 Atherosclerotic heart disease of native coronary artery without angina pectoris: Secondary | ICD-10-CM | POA: Insufficient documentation

## 2016-10-24 DIAGNOSIS — G8929 Other chronic pain: Secondary | ICD-10-CM | POA: Diagnosis not present

## 2016-10-24 DIAGNOSIS — M159 Polyosteoarthritis, unspecified: Secondary | ICD-10-CM | POA: Diagnosis present

## 2016-10-24 DIAGNOSIS — N179 Acute kidney failure, unspecified: Secondary | ICD-10-CM | POA: Diagnosis not present

## 2016-10-24 DIAGNOSIS — N138 Other obstructive and reflux uropathy: Secondary | ICD-10-CM | POA: Diagnosis not present

## 2016-10-24 DIAGNOSIS — N400 Enlarged prostate without lower urinary tract symptoms: Secondary | ICD-10-CM | POA: Diagnosis present

## 2016-10-24 DIAGNOSIS — R52 Pain, unspecified: Secondary | ICD-10-CM

## 2016-10-24 DIAGNOSIS — Z79899 Other long term (current) drug therapy: Secondary | ICD-10-CM | POA: Insufficient documentation

## 2016-10-24 DIAGNOSIS — I129 Hypertensive chronic kidney disease with stage 1 through stage 4 chronic kidney disease, or unspecified chronic kidney disease: Secondary | ICD-10-CM | POA: Insufficient documentation

## 2016-10-24 DIAGNOSIS — R071 Chest pain on breathing: Secondary | ICD-10-CM | POA: Diagnosis not present

## 2016-10-24 DIAGNOSIS — Z87891 Personal history of nicotine dependence: Secondary | ICD-10-CM | POA: Insufficient documentation

## 2016-10-24 DIAGNOSIS — N183 Chronic kidney disease, stage 3 unspecified: Secondary | ICD-10-CM | POA: Diagnosis present

## 2016-10-24 DIAGNOSIS — R599 Enlarged lymph nodes, unspecified: Secondary | ICD-10-CM

## 2016-10-24 DIAGNOSIS — M25462 Effusion, left knee: Secondary | ICD-10-CM | POA: Insufficient documentation

## 2016-10-24 DIAGNOSIS — R0602 Shortness of breath: Secondary | ICD-10-CM

## 2016-10-24 DIAGNOSIS — N1832 Chronic kidney disease, stage 3b: Secondary | ICD-10-CM | POA: Diagnosis present

## 2016-10-24 DIAGNOSIS — R609 Edema, unspecified: Secondary | ICD-10-CM | POA: Diagnosis not present

## 2016-10-24 DIAGNOSIS — R338 Other retention of urine: Secondary | ICD-10-CM | POA: Diagnosis not present

## 2016-10-24 DIAGNOSIS — I10 Essential (primary) hypertension: Secondary | ICD-10-CM | POA: Diagnosis present

## 2016-10-24 DIAGNOSIS — R079 Chest pain, unspecified: Principal | ICD-10-CM | POA: Diagnosis present

## 2016-10-24 DIAGNOSIS — I351 Nonrheumatic aortic (valve) insufficiency: Secondary | ICD-10-CM

## 2016-10-24 DIAGNOSIS — R591 Generalized enlarged lymph nodes: Secondary | ICD-10-CM

## 2016-10-24 DIAGNOSIS — E049 Nontoxic goiter, unspecified: Secondary | ICD-10-CM | POA: Diagnosis not present

## 2016-10-24 DIAGNOSIS — R600 Localized edema: Secondary | ICD-10-CM | POA: Insufficient documentation

## 2016-10-24 DIAGNOSIS — M109 Gout, unspecified: Secondary | ICD-10-CM

## 2016-10-24 DIAGNOSIS — E785 Hyperlipidemia, unspecified: Secondary | ICD-10-CM | POA: Insufficient documentation

## 2016-10-24 DIAGNOSIS — J449 Chronic obstructive pulmonary disease, unspecified: Secondary | ICD-10-CM | POA: Diagnosis not present

## 2016-10-24 DIAGNOSIS — R42 Dizziness and giddiness: Secondary | ICD-10-CM | POA: Diagnosis not present

## 2016-10-24 LAB — COMPREHENSIVE METABOLIC PANEL
ALBUMIN: 3.1 g/dL — AB (ref 3.5–5.0)
ALT: 12 U/L — AB (ref 17–63)
AST: 16 U/L (ref 15–41)
Alkaline Phosphatase: 64 U/L (ref 38–126)
Anion gap: 8 (ref 5–15)
BUN: 33 mg/dL — ABNORMAL HIGH (ref 6–20)
CHLORIDE: 100 mmol/L — AB (ref 101–111)
CO2: 25 mmol/L (ref 22–32)
CREATININE: 2.69 mg/dL — AB (ref 0.61–1.24)
Calcium: 8.2 mg/dL — ABNORMAL LOW (ref 8.9–10.3)
GFR calc Af Amer: 27 mL/min — ABNORMAL LOW (ref >60)
GFR calc non Af Amer: 23 mL/min — ABNORMAL LOW (ref >60)
GLUCOSE: 135 mg/dL — AB (ref 65–99)
POTASSIUM: 3.6 mmol/L (ref 3.5–5.1)
SODIUM: 133 mmol/L — AB (ref 135–145)
Total Bilirubin: 1.4 mg/dL — ABNORMAL HIGH (ref 0.3–1.2)
Total Protein: 6.6 g/dL (ref 6.5–8.1)

## 2016-10-24 LAB — CBC WITH DIFFERENTIAL/PLATELET
Basophils Absolute: 0 10*3/uL (ref 0.0–0.1)
Basophils Relative: 0 %
Eosinophils Absolute: 0.2 10*3/uL (ref 0.0–0.7)
Eosinophils Relative: 2 %
HEMATOCRIT: 37.9 % — AB (ref 39.0–52.0)
HEMOGLOBIN: 12 g/dL — AB (ref 13.0–17.0)
LYMPHS ABS: 2.8 10*3/uL (ref 0.7–4.0)
Lymphocytes Relative: 21 %
MCH: 27.8 pg (ref 26.0–34.0)
MCHC: 31.7 g/dL (ref 30.0–36.0)
MCV: 87.7 fL (ref 78.0–100.0)
MONO ABS: 1.4 10*3/uL — AB (ref 0.1–1.0)
Monocytes Relative: 10 %
NEUTROS ABS: 8.8 10*3/uL — AB (ref 1.7–7.7)
NEUTROS PCT: 67 %
Platelets: 232 10*3/uL (ref 150–400)
RBC: 4.32 MIL/uL (ref 4.22–5.81)
RDW: 14.2 % (ref 11.5–15.5)
WBC: 13.2 10*3/uL — AB (ref 4.0–10.5)

## 2016-10-24 LAB — URINALYSIS, ROUTINE W REFLEX MICROSCOPIC
Bilirubin Urine: NEGATIVE
GLUCOSE, UA: NEGATIVE mg/dL
KETONES UR: NEGATIVE mg/dL
NITRITE: NEGATIVE
PH: 8 (ref 5.0–8.0)
PROTEIN: 30 mg/dL — AB
Specific Gravity, Urine: 1.014 (ref 1.005–1.030)

## 2016-10-24 LAB — ECHOCARDIOGRAM COMPLETE
Height: 73 in
Weight: 4080 oz

## 2016-10-24 LAB — I-STAT TROPONIN, ED: Troponin i, poc: 0 ng/mL (ref 0.00–0.08)

## 2016-10-24 LAB — TROPONIN I
Troponin I: <0.03 ng/mL (ref <0.03)
Troponin I: <0.03 ng/mL (ref <0.03)

## 2016-10-24 LAB — GLUCOSE, CAPILLARY: Glucose-Capillary: 138 mg/dL — ABNORMAL HIGH (ref 65–99)

## 2016-10-24 LAB — D-DIMER, QUANTITATIVE: D-Dimer, Quant: 3.27 ug/mL-FEU — ABNORMAL HIGH (ref 0.00–0.50)

## 2016-10-24 LAB — BRAIN NATRIURETIC PEPTIDE: B Natriuretic Peptide: 51 pg/mL (ref 0.0–100.0)

## 2016-10-24 MED ORDER — ALLOPURINOL 100 MG PO TABS
100.0000 mg | ORAL_TABLET | Freq: Every day | ORAL | Status: DC
  Administered 2016-10-25 – 2016-10-26 (×2): 100 mg via ORAL
  Filled 2016-10-24 (×2): qty 1

## 2016-10-24 MED ORDER — ASPIRIN EC 325 MG PO TBEC
325.0000 mg | DELAYED_RELEASE_TABLET | Freq: Every day | ORAL | Status: DC
  Administered 2016-10-24 – 2016-10-26 (×3): 325 mg via ORAL
  Filled 2016-10-24 (×3): qty 1

## 2016-10-24 MED ORDER — ACETAMINOPHEN-CODEINE #3 300-30 MG PO TABS
1.0000 | ORAL_TABLET | Freq: Four times a day (QID) | ORAL | Status: DC | PRN
  Administered 2016-10-25 – 2016-10-26 (×4): 1 via ORAL
  Filled 2016-10-24 (×4): qty 1

## 2016-10-24 MED ORDER — ASPIRIN 81 MG PO TABS: 81.0000 mg | ORAL_TABLET | Freq: Every day | ORAL | Status: DC

## 2016-10-24 MED ORDER — ENOXAPARIN SODIUM 30 MG/0.3ML ~~LOC~~ SOLN
30.0000 mg | SUBCUTANEOUS | Status: DC
  Administered 2016-10-24: 30 mg via SUBCUTANEOUS
  Filled 2016-10-24: qty 0.3

## 2016-10-24 MED ORDER — OXYBUTYNIN CHLORIDE 5 MG PO TABS: 5.0000 mg | ORAL_TABLET | Freq: Every day | ORAL | Status: DC | PRN

## 2016-10-24 MED ORDER — MORPHINE SULFATE (PF) 4 MG/ML IV SOLN
4.0000 mg | Freq: Once | INTRAVENOUS | Status: AC
  Administered 2016-10-24: 4 mg via INTRAVENOUS
  Filled 2016-10-24: qty 1

## 2016-10-24 MED ORDER — TECHNETIUM TC 99M DIETHYLENETRIAME-PENTAACETIC ACID
32.5000 | Freq: Once | INTRAVENOUS | Status: AC | PRN
  Administered 2016-10-24: 32.5 via RESPIRATORY_TRACT

## 2016-10-24 MED ORDER — ATORVASTATIN CALCIUM 40 MG PO TABS
40.0000 mg | ORAL_TABLET | Freq: Every day | ORAL | Status: DC
  Administered 2016-10-25 – 2016-10-26 (×2): 40 mg via ORAL
  Filled 2016-10-24 (×2): qty 1

## 2016-10-24 MED ORDER — TECHNETIUM TO 99M ALBUMIN AGGREGATED
4.0900 | Freq: Once | INTRAVENOUS | Status: AC | PRN
  Administered 2016-10-24: 4.09 via INTRAVENOUS

## 2016-10-24 MED ORDER — NITROGLYCERIN 0.4 MG SL SUBL: 0.4000 mg | SUBLINGUAL_TABLET | SUBLINGUAL | Status: DC | PRN

## 2016-10-24 MED ORDER — ONDANSETRON HCL 4 MG/2ML IJ SOLN: 4.0000 mg | Freq: Four times a day (QID) | INTRAMUSCULAR | Status: DC | PRN

## 2016-10-24 MED ORDER — METOPROLOL TARTRATE 25 MG PO TABS
25.0000 mg | ORAL_TABLET | Freq: Two times a day (BID) | ORAL | Status: DC
  Administered 2016-10-24 – 2016-10-26 (×4): 25 mg via ORAL
  Filled 2016-10-24 (×4): qty 1

## 2016-10-24 MED ORDER — ACETAMINOPHEN 325 MG PO TABS
650.0000 mg | ORAL_TABLET | Freq: Four times a day (QID) | ORAL | Status: DC | PRN
  Administered 2016-10-25: 650 mg via ORAL
  Filled 2016-10-24: qty 2

## 2016-10-24 MED ORDER — LABETALOL HCL 200 MG PO TABS: 200.0000 mg | ORAL_TABLET | Freq: Every morning | ORAL | Status: DC

## 2016-10-24 MED ORDER — AMLODIPINE BESYLATE 5 MG PO TABS
5.0000 mg | ORAL_TABLET | Freq: Every day | ORAL | Status: DC
  Administered 2016-10-24 – 2016-10-26 (×3): 5 mg via ORAL
  Filled 2016-10-24 (×3): qty 1

## 2016-10-24 MED ORDER — MORPHINE SULFATE (PF) 4 MG/ML IV SOLN
2.0000 mg | INTRAVENOUS | Status: DC | PRN
  Administered 2016-10-25 (×3): 2 mg via INTRAVENOUS
  Filled 2016-10-24 (×3): qty 1

## 2016-10-24 MED ORDER — GI COCKTAIL ~~LOC~~: 30.0000 mL | Freq: Four times a day (QID) | ORAL | Status: DC | PRN

## 2016-10-24 MED ORDER — OXYCODONE-ACETAMINOPHEN 5-325 MG PO TABS
2.0000 | ORAL_TABLET | Freq: Once | ORAL | Status: AC
  Administered 2016-10-24: 2 via ORAL
  Filled 2016-10-24: qty 2

## 2016-10-24 NOTE — ED Notes (Signed)
Patient transported to V/Q scan 

## 2016-10-24 NOTE — ED Notes (Signed)
Urine obtained from pt foley that he arrived to ED with from [facility]. Urine is malodorous and cloudy, MD informed and advised to send sample to lab.

## 2016-10-24 NOTE — ED Triage Notes (Signed)
Pt reports new onset leg edema 3 days ago that has gradually gotten worse. Pt also reports this morning he was sitting down, had just eaten breakfast and began experiencing centralized chest pain that radiates to the back. Pt reports he began to feel dizzy and had a near syncopal episode. Called EMS. Chest pain 6/10. Given 324 Asprin by EMS and 1 nitro with no relief. Bilateral leg pain 10/10.

## 2016-10-24 NOTE — ED Notes (Signed)
Patient transported to X-ray 

## 2016-10-24 NOTE — Progress Notes (Signed)
VASCULAR LAB PRELIMINARY  PRELIMINARY  PRELIMINARY  PRELIMINARY  Bilateral lower extremity venous duplex completed.    Preliminary report:  There is no DVT or SVT noted in the bilateral lower extremities.  There is interstitial fluid noted throughout.  Enlarged inguinal lymph nodes noted.   Called report to Dr. Lawanda Cousins, [facility], RVT 10/24/2016, 10:23 AM

## 2016-10-24 NOTE — H&P (Signed)
History and Physical    [name] SWF:[id number] DOB: 07-Oct-1950 DOA: 10/24/2016  PCP: Biagio Borg, MD (Confirm with patient/family/NH records and if not entered, this has to be entered at [facility] point of entry) Patient coming from: Home  I have personally briefly reviewed patient's old medical records in [place]  Chief Complaint: Chest pain which occurred this morning  HPI: [name] is a 66 y.o. male with medical history significant of CKD, HTN, HLD here with leg swelling and chest pain. Pt states his sx started as 1-2 weeks of bilateral, L>R leg pain and swelling. Pain started in his bilateral ankles but now extends to his b/l calves and popliteal region. He was seen by his PCP and started on allopurinol for possible gout which did not improve his sx. Over the past day, he has developed worsening b/l leg pain that is aching, throbbing, worse with movement. Earlier today, he also had acute onset of sharp, pleuritic, left-sided chest pain. Pain worse with movement, deep inspiration. It occurred after he had started eating. No nausea or vomiting. He had associated SOB and diaphoresis. Pain improved with ntiro and ASA by EMS. No other known relieving factors.  Patient states the pain was so sharp that he slid to the floor on his knees and decided to call EMS.  ED Course: Troponin was unremarkable VQ scan was ordered due to elevated d-dimer. In the emergency department chest pain was improved and referral was made to [facility] for further evaluation and management of chest pain and possible pulmonary embolism VQ scan is pending.  Review of Systems: As per HPI otherwise 10 point review of systems negative.   Review of Systems  Constitutional: Negative for chills and fever.  HENT: Negative for congestion and sinus pain.   Eyes: Negative for blurred vision and double vision.  Respiratory: Negative for cough, hemoptysis and sputum production.   Cardiovascular: Positive for  chest pain and leg swelling. Negative for palpitations, orthopnea, claudication and PND.  Gastrointestinal: Negative for heartburn, nausea and vomiting.  Genitourinary: Negative for dysuria, frequency and urgency.  Musculoskeletal: Negative for back pain, myalgias and neck pain.  Skin: Negative for itching and rash.  Neurological: Negative for dizziness and headaches.  Endo/Heme/Allergies: Negative for environmental allergies. Does not bruise/bleed easily.  Psychiatric/Behavioral: Negative for depression and suicidal ideas.   Past Medical History:  Diagnosis Date  . Anemia    normal Fe, nl B12, nl retic, nl EPO July '13  . Blood transfusion without reported diagnosis   . BPH (benign prostatic hyperplasia)   . Chronic back pain   . Chronic kidney disease    CKD III, obstructive nephropathy  . Diverticulosis   . Dysuria   . Elevated PSA, greater than or equal to 20 ng/ml June '13   PSA 107  . Hemorrhoids, internal, with bleeding, prolapse 09/19/2014  . Hyperlipidemia 05/29/2014  . Hypertension   . Obstructive uropathy 11/27/2015  . Renal insufficiency   . Tuberculosis    h/o PPD +  . UTI (urinary tract infection)     Past Surgical History:  Procedure Laterality Date  . CARPAL TUNNEL RELEASE Right   . COLONOSCOPY    . HEMORRHOID BANDING    . PROSTATE SURGERY    . SPLENECTOMY       reports that he has quit smoking. His smoking use included Cigarettes. He has never used smokeless tobacco. He reports that he does not drink alcohol or use drugs.  No Known Allergies  Family History  Problem Relation Age of Onset  . Diabetes Mother   . Stroke Brother   . Hearing loss Brother   . Colon cancer Neg Hx   . Rectal cancer Neg Hx   . Stomach cancer Neg Hx   . Esophageal cancer Neg Hx     Prior to Admission medications   Medication Sig Start Date End Date Taking? Authorizing Provider  acetaminophen (TYLENOL) 325 MG tablet Take 2 tablets (650 mg total) by mouth every 6 (six)  hours as needed. Patient taking differently: Take 650 mg by mouth every 6 (six) hours as needed for mild pain or moderate pain.  05/23/15  Yes Varney Biles, MD  Acetaminophen-Codeine (TYLENOL/CODEINE #3) 300-30 MG tablet Take 0.5-1 tablets by mouth every 6 (six) hours as needed for pain. 10/12/16  Yes Plotnikov, Evie Lacks, MD  allopurinol (ZYLOPRIM) 100 MG tablet Take 1 tablet (100 mg total) by mouth daily. 10/20/16  Yes Biagio Borg, MD  amLODipine (NORVASC) 5 MG tablet Take 1 tablet (5 mg total) by mouth daily. 10/12/16  Yes Plotnikov, Evie Lacks, MD  aspirin 81 MG tablet Take 81 mg by mouth daily.   Yes [provider]  atorvastatin (LIPITOR) 40 MG tablet Take 1 tablet (40 mg total) by mouth daily. 10/20/16  Yes Biagio Borg, MD  labetalol (NORMODYNE) 200 MG tablet Take 1 tablet (200 mg total) by mouth every morning. 03/23/16  Yes Biagio Borg, MD  oxybutynin (DITROPAN) 5 MG tablet Take 1 tablet (5 mg total) by mouth daily. Patient taking differently: Take 5 mg by mouth daily as needed for bladder spasms.  05/29/14  Yes Biagio Borg, MD  methylPREDNISolone (MEDROL DOSEPAK) 4 MG TBPK tablet As directed Patient not taking: Reported on 10/24/2016 10/12/16   Cassandria Anger, MD    Physical Exam: Vitals:   10/24/16 1130 10/24/16 1145 10/24/16 1230 10/24/16 1300  BP: 109/78 114/75 114/77 116/71  Pulse: 64 65 65 65  Resp: 16 19 17 18   Temp:      TempSrc:      SpO2: 94% 96% 93% 96%  Weight:      Height:        Constitutional: NAD, calm, comfortable Vitals:   10/24/16 1130 10/24/16 1145 10/24/16 1230 10/24/16 1300  BP: 109/78 114/75 114/77 116/71  Pulse: 64 65 65 65  Resp: 16 19 17 18   Temp:      TempSrc:      SpO2: 94% 96% 93% 96%  Weight:      Height:       Eyes: PERRL, lids and conjunctivae normal ENMT: Mucous membranes are moist. Posterior pharynx clear of any exudate or lesions.Normal dentition.  Neck: normal, supple, no masses, no thyromegaly Respiratory: clear to  auscultation bilaterally, no wheezing, no crackles. Normal respiratory effort. No accessory muscle use.  Cardiovascular: Regular rate and rhythm, no murmurs / rubs / gallops. No extremity edema. 2+ pedal pulses. No carotid bruits.  Abdomen: no tenderness, no masses palpated. No hepatosplenomegaly. Bowel sounds positive. GU examination with chronic indwelling Foley catheter Musculoskeletal: no clubbing / cyanosis. No joint deformity upper and lower extremities. Good ROM, no contractures. Normal muscle tone.  Skin: no rashes, lesions, ulcers. No induration.  Bilateral 1+ lower extremity pitting edema to the knees Neurologic: CN 2-12 grossly intact. Sensation intact, DTR normal. Strength 5/5 in all 4.  Psychiatric: Normal judgment and insight. Alert and oriented x 3. Normal mood.    Labs on Admission: I have  personally reviewed following labs and imaging studies  CBC:  Recent Labs Lab 10/20/16 1555 10/24/16 0925  WBC 9.0 13.2*  NEUTROABS 4.1 8.8*  HGB 13.1 12.0*  HCT 40.4 37.9*  MCV 86.2 87.7  PLT 316.0 465   Basic Metabolic Panel:  Recent Labs Lab 10/20/16 1555 10/24/16 0925  NA 139 133*  K 3.8 3.6  CL 99 100*  CO2 34* 25  GLUCOSE 86 135*  BUN 39* 33*  CREATININE 2.44* 2.69*  CALCIUM 8.9 8.2*   GFR: Estimated Creatinine Clearance: 36 mL/min (A) (by C-G formula based on SCr of 2.69 mg/dL (H)). Liver Function Tests:  Recent Labs Lab 10/20/16 1555 10/24/16 0925  AST 13 16  ALT 15 12*  ALKPHOS 90 64  BILITOT 0.6 1.4*  PROT 7.0 6.6  ALBUMIN 3.9 3.1*       Component Value Date/Time   COLORURINE YELLOW 10/24/2016 1208   APPEARANCEUR CLOUDY (A) 10/24/2016 1208   LABSPEC 1.014 10/24/2016 1208   PHURINE 8.0 10/24/2016 1208   GLUCOSEU NEGATIVE 10/24/2016 [place] 10/20/2016 1555   HGBUR SMALL (A) 10/24/2016 1208   HGBUR negative 01/29/2010 0000   BILIRUBINUR NEGATIVE 10/24/2016 [place] 10/24/2016 1208   PROTEINUR 30 (A)  10/24/2016 1208   UROBILINOGEN 0.2 10/20/2016 1555   NITRITE NEGATIVE 10/24/2016 1208   LEUKOCYTESUR LARGE (A) 10/24/2016 1208    Radiological Exams on Admission: Dg Chest 2 View  Result Date: 10/24/2016 CLINICAL DATA:  Chest pain, shortness of breath EXAM: CHEST  2 VIEW COMPARISON:  05/27/2015 FINDINGS: Lungs are clear.  No pleural effusion or pneumothorax. Cardiomegaly. Visualized osseous structures are within normal limits. IMPRESSION: No evidence of acute cardiopulmonary disease. Electronically Signed   By: Julian Hy M.D.   On: 10/24/2016 11:37    EKG: Independently reviewed. Sinus rhythm Abnormal R-wave progression, early transition Left ventricular hypertrophy Nonspecific T abnormalities, lateral leads Since last EKG, TWI in inferior leads has improved Otherwise no significant change  Assessment/Plan Principal Problem:   Chest pain Active Problems:   PERIPHERAL EDEMA   Essential hypertension   BPH (benign prostatic hyperplasia)   CKD (chronic kidney disease) stage 3, GFR 30-59 ml/min   Urinary retention due to benign prostatic hyperplasia   Osteoarthrosis, generalized, multiple joints  (please populate well all problems here in Problem List. (For example, if patient is on BP meds at home and you resume or decide to hold them, it is a problem that needs to be her. Same for CAD, COPD, HLD and so on)   1. Chest pain: Patient will be admitted to the hospital and her chest pain protocol. We'll obtain serial enzymes and EKGs. If these are unremarkable patient will proceed to nuclear medicine stress testing. Also plan to rule out pulmonary embolism given elevated d-dimer. Although this may elevate in the face of renal failure given the patient's acute onset and severe onset of chest pain it would be valuable to evaluate. He will undergo VQ scanning. He is not a candidate for CT scan due to renal failure.  2. Peripheral edema: Etiology is unclear does not appear to be related  to gout will therefore discontinue gout medications. Will check echocardiogram and determine whether or not patient may have some systolic or diastolic congestive heart failure contributing to problem. He does have chronic indwelling Foley but the Foley is here for BPH and not for tumor and therefore I do not think that there is any obstruction of lymphatic flow from  the legs.  3. Essential hypertension we'll hold patient's home labetalol and start metoprolol as this is more cardioprotective.  4. Benign prostatic hyperplasia this is noted patient has chronic indwelling Foley catheter.  5. Chronic kidney disease stage III: We'll continue home medications and avoid nephrotoxic agents. Patient is unable to get IV dye and therefore will be getting a VQ scan instead of a CT a of the chest also will not obtain CT of the neck but will do a ultrasound of the neck.  6. Urinary retention due to benign prostatic hyperplasia patient has chronic indwelling Foley catheter.  7. Osteoarthrosis of generalized multiple joints noted. Continue home medications.  DVT prophylaxis: Renal dose Lovenox Code Status: Full code Family Communication: Patient with no family present Disposition Plan: Likely home in 48 hours Consults called: None Admission status: Observation   Lady Deutscher MD [facility] Pager [phone]  If 7PM-7AM, please contact night-coverage [email or web address] Password [email or web address]  10/24/2016, 1:36 PM

## 2016-10-24 NOTE — ED Notes (Signed)
Pt returned from [facility].

## 2016-10-24 NOTE — ED Provider Notes (Signed)
[facility] DEPT Provider Note   CSN: [id number] Arrival date & time: 10/24/16  0917     History   Chief Complaint Chief Complaint  Patient presents with  . Chest Pain  . Near Syncope  . Leg Swelling    HPI [name] is a 66 y.o. male.  HPI   66 yo M with PMHx as below including CKD, HTN, HLD here with leg swelling and chest pain. Pt states his sx started as 1-2 weeks of bilateral, L>R leg pain and swelling. Pain started in his bilateral ankles but now extends to his b/l calves and popliteal region. He was seen by his PCP and started on allopurinol for possible gout which did not improve his sx. Over the past day, he has developed worsening b/l leg pain that is aching, throbbing, worse with movement. Earlier today, he also had acute onset of sharp, pleuritic, left-sided chest pain. Pain worse with movement, deep inspiration. It occurred after he had started eating. No nausea or vomiting. He had associated SOB and diaphoresis. Pain improved with ntiro and ASA by EMS. No other known relieving factors.  Past Medical History:  Diagnosis Date  . Anemia    normal Fe, nl B12, nl retic, nl EPO July '13  . Blood transfusion without reported diagnosis   . BPH (benign prostatic hyperplasia)   . Chronic back pain   . Chronic kidney disease    CKD III, obstructive nephropathy  . Diverticulosis   . Dysuria   . Elevated PSA, greater than or equal to 20 ng/ml June '13   PSA 107  . Hemorrhoids, internal, with bleeding, prolapse 09/19/2014  . Hyperlipidemia 05/29/2014  . Hypertension   . Obstructive uropathy 11/27/2015  . Renal insufficiency   . Tuberculosis    h/o PPD +  . UTI (urinary tract infection)     Patient Active Problem List   Diagnosis Date Noted  . Mass of right side of neck 10/20/2016  . Gout 10/20/2016  . Knee pain, acute 10/12/2016  . Hypokalemia 05/28/2016  . Encounter for well adult exam with abnormal findings 11/27/2015  . Obstructive uropathy 11/27/2015  .  Elevated PSA 11/27/2015  . Acute pyelonephritis 05/27/2015  . Generalized bloating 05/27/2015  . Constipation 05/27/2015  . Chest pain 05/27/2015  . AKI (acute kidney injury) ([name]) 05/27/2015  . Acute sinus infection 11/22/2014  . Cough 09/25/2014  . Hemorrhoids, internal, with bleeding, prolapse 09/19/2014  . 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 Hyperlipidemia 05/29/2014  . Lumbar radiculopathy 05/23/2014  . Lumbar stenosis 11/20/2013  . Abnormal glucose 11/06/2013  . Unspecified hereditary and idiopathic peripheral neuropathy 01/22/2013  . 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 CKD (chronic kidney disease) stage 3, GFR 30-59 ml/min 11/24/2011  . Hematuria 11/24/2011  . Hydronephrosis 11/24/2011  . Anemia 11/24/2011  . BRADYCARDIA 02/05/2010  . TINEA PEDIS 01/29/2010  . 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 PERIPHERAL EDEMA 01/29/2010  . ABNORMAL ELECTROCARDIOGRAM 01/29/2010  . 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  . Osteoarthrosis, generalized, 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  . ONYCHOMYCOSIS, TOENAILS 10/02/2007  . BENIGN PROSTATIC HYPERTROPHY 10/02/2007  . Essential hypertension 03/06/2007    Past Surgical History:  Procedure Laterality Date  . CARPAL TUNNEL RELEASE Right   . COLONOSCOPY    . HEMORRHOID BANDING    . PROSTATE SURGERY    . SPLENECTOMY         Home Medications    Prior to Admission medications   Medication Sig Start Date End Date Taking? Authorizing  Provider  acetaminophen (TYLENOL) 325 MG tablet Take 2 tablets (650 mg total) by mouth every 6 (six) hours as needed. Patient taking differently: Take 650 mg by mouth every 6 (six) hours as needed for mild pain or moderate pain.  05/23/15   Varney Biles, MD  Acetaminophen-Codeine (TYLENOL/CODEINE #3) 300-30 MG tablet Take 0.5-1 tablets by mouth every 6 (six) hours as needed for pain. 10/12/16   Plotnikov, Evie Lacks, MD  allopurinol (ZYLOPRIM) 100 MG tablet Take 1 tablet (100 mg total) by mouth daily.  10/20/16   Biagio Borg, MD  amLODipine (NORVASC) 5 MG tablet Take 1 tablet (5 mg total) by mouth daily. 10/12/16   Plotnikov, Evie Lacks, MD  aspirin 81 MG tablet Take 81 mg by mouth daily.    [provider]  atorvastatin (LIPITOR) 40 MG tablet Take 1 tablet (40 mg total) by mouth daily. 10/20/16   Biagio Borg, MD  labetalol (NORMODYNE) 200 MG tablet Take 1 tablet (200 mg total) by mouth every morning. 03/23/16   Biagio Borg, MD  methylPREDNISolone (MEDROL DOSEPAK) 4 MG TBPK tablet As directed 10/12/16   Plotnikov, Evie Lacks, MD  oxybutynin (DITROPAN) 5 MG tablet Take 1 tablet (5 mg total) by mouth daily. Patient taking differently: Take 5 mg by mouth daily as needed for bladder spasms.  05/29/14   Biagio Borg, MD    Family History Family History  Problem Relation Age of Onset  . Diabetes Mother   . Stroke Brother   . Hearing loss Brother   . Colon cancer Neg Hx   . Rectal cancer Neg Hx   . Stomach cancer Neg Hx   . Esophageal cancer Neg Hx     Social History Social History  Substance Use Topics  . Smoking status: Former Smoker    Types: Cigarettes  . Smokeless tobacco: Never Used     Comment: Quit 1981  . Alcohol use No     Comment: Quit 2004     Allergies   Patient has no known allergies.   Review of Systems Review of Systems  Constitutional: Positive for diaphoresis and fatigue.  Respiratory: Positive for cough, chest tightness and shortness of breath.   Cardiovascular: Positive for chest pain and leg swelling.  Neurological: Positive for weakness.  All other systems reviewed and are negative.    Physical Exam Updated Vital Signs BP 106/66   Pulse 73   Temp 98.4 F (36.9 C) (Oral)   Resp (!) 21   Ht 6\' 1"  (1.854 m)   Wt 255 lb (115.7 kg)   SpO2 95%   BMI 33.64 kg/m   Physical Exam  Constitutional: He is oriented to person, place, and time. He appears well-developed and well-nourished. No distress.  HENT:  Head: Normocephalic and  atraumatic.  Eyes: Conjunctivae are normal.  Neck: Neck supple.  Cardiovascular: Normal rate, regular rhythm and normal heart sounds.  Exam reveals no friction rub.   No murmur heard. Pulmonary/Chest: Effort normal and breath sounds normal. No respiratory distress. He has no wheezes. He has no rales. He exhibits no tenderness (no chest wall TPP).  Abdominal: Soft. He exhibits no distension.  Musculoskeletal: He exhibits edema (bilateral 1+ pitting edema bilaterally, moderate left>right popliteal TTP).  No joint effusions, warmth, or redness though there is TTP over bilateral first MTPs  Neurological: He is alert and oriented to person, place, and time. He exhibits normal muscle tone.  Skin: Skin is warm. Capillary refill takes less than 2  seconds.  Psychiatric: He has a normal mood and affect.  Nursing note and vitals reviewed.    ED Treatments / Results  Labs (all labs ordered are listed, but only abnormal results are displayed) Labs Reviewed  CBC WITH DIFFERENTIAL/PLATELET  COMPREHENSIVE METABOLIC PANEL  BRAIN NATRIURETIC PEPTIDE  URINALYSIS, ROUTINE W REFLEX MICROSCOPIC  D-DIMER, QUANTITATIVE (NOT AT [facility])  I-STAT TROPOININ, ED    EKG  EKG Interpretation  Date/Time:  Sunday Oct 24 2016 09:18:09 EDT Ventricular Rate:  70 PR Interval:    QRS Duration: 98 QT Interval:  370 QTC Calculation: 400 R Axis:   -37 Text Interpretation:  Sinus rhythm Abnormal R-wave progression, early transition Left ventricular hypertrophy Nonspecific T abnormalities, lateral leads Since last EKG, TWI in inferior leads has improved Otherwise no significant change Confirmed by Crissa Sowder MD, Mckenize Mezera ([id number]) on 10/24/2016 9:21:08 AM       Radiology No results found.  Procedures Procedures (including critical care time)  Medications Ordered in ED Medications  nitroGLYCERIN (NITROSTAT) SL tablet 0.4 mg (not administered)  morphine 4 MG/ML injection 4 mg (not administered)     Initial Impression  / Assessment and Plan / ED Course  I have reviewed the triage vital signs and the nursing notes.  Pertinent labs & imaging results that were available during my care of the patient were reviewed by me and considered in my medical decision making (see chart for details).     66  yo M with PMHx CKD, HTN, HLD, obesity here with leg swelling x 1-2 weeks and now acute onset sharp CP. EKG without acute changes. DDx broad, primary considerations include DVT/PE, ACS, systemic gout/inflammatory arthritis, CHF, worsening renal failure. Will check labs, bilateral U/S and D-Dimer given known CKD - may need V/Q if remainder of work-up unremarkable. He does have some typical components to his CP as well and HEART score is 5 - will likely need provocative testing/rule out.  Labs as above. Pt has worsening of baseline CKD, o/w unremarkable labs with exception of mild leukocytosis. BNP normal, doubt CHF. D-Dimer markedly elevated and LE venous neg - will need V/Q given CKD. CXR is clear, so V/Q should be appropriately sensitive. Otherwise, his CP is improved here. Will admit for high-risk (HEART score 5) CP evaluation, trending kidney function given already severe baseline CKD, and V/Q scanning/PE rule out.  Final Clinical Impressions(s) / ED Diagnoses   Final diagnoses:  SOB (shortness of breath)  Chest pain with high risk for cardiac etiology  Acute renal failure superimposed on stage 3 chronic kidney disease, unspecified acute renal failure type [place])    New Prescriptions New Prescriptions   No medications on file     Duffy Bruce, MD 10/24/16 2030

## 2016-10-24 NOTE — ED Notes (Signed)
Patient transported to Ultrasound 

## 2016-10-25 ENCOUNTER — Observation Stay (HOSPITAL_COMMUNITY): Payer: Medicare Other

## 2016-10-25 ENCOUNTER — Encounter (HOSPITAL_COMMUNITY): Payer: Self-pay

## 2016-10-25 DIAGNOSIS — M25461 Effusion, right knee: Secondary | ICD-10-CM | POA: Diagnosis not present

## 2016-10-25 DIAGNOSIS — R079 Chest pain, unspecified: Secondary | ICD-10-CM | POA: Diagnosis not present

## 2016-10-25 DIAGNOSIS — M1711 Unilateral primary osteoarthritis, right knee: Secondary | ICD-10-CM | POA: Diagnosis not present

## 2016-10-25 DIAGNOSIS — N183 Chronic kidney disease, stage 3 (moderate): Secondary | ICD-10-CM | POA: Diagnosis not present

## 2016-10-25 DIAGNOSIS — M25462 Effusion, left knee: Secondary | ICD-10-CM | POA: Diagnosis not present

## 2016-10-25 DIAGNOSIS — I129 Hypertensive chronic kidney disease with stage 1 through stage 4 chronic kidney disease, or unspecified chronic kidney disease: Secondary | ICD-10-CM | POA: Diagnosis not present

## 2016-10-25 DIAGNOSIS — R599 Enlarged lymph nodes, unspecified: Secondary | ICD-10-CM

## 2016-10-25 DIAGNOSIS — N179 Acute kidney failure, unspecified: Secondary | ICD-10-CM | POA: Diagnosis not present

## 2016-10-25 DIAGNOSIS — M159 Polyosteoarthritis, unspecified: Secondary | ICD-10-CM

## 2016-10-25 DIAGNOSIS — R338 Other retention of urine: Secondary | ICD-10-CM | POA: Diagnosis not present

## 2016-10-25 DIAGNOSIS — N401 Enlarged prostate with lower urinary tract symptoms: Secondary | ICD-10-CM

## 2016-10-25 DIAGNOSIS — M1712 Unilateral primary osteoarthritis, left knee: Secondary | ICD-10-CM | POA: Diagnosis not present

## 2016-10-25 DIAGNOSIS — I10 Essential (primary) hypertension: Secondary | ICD-10-CM | POA: Diagnosis not present

## 2016-10-25 DIAGNOSIS — K439 Ventral hernia without obstruction or gangrene: Secondary | ICD-10-CM | POA: Diagnosis not present

## 2016-10-25 DIAGNOSIS — M1039 Gout due to renal impairment, multiple sites: Secondary | ICD-10-CM | POA: Diagnosis not present

## 2016-10-25 DIAGNOSIS — E1122 Type 2 diabetes mellitus with diabetic chronic kidney disease: Secondary | ICD-10-CM | POA: Diagnosis not present

## 2016-10-25 LAB — CBC
HCT: 39.1 % (ref 39.0–52.0)
Hemoglobin: 12.3 g/dL — ABNORMAL LOW (ref 13.0–17.0)
MCH: 27.6 pg (ref 26.0–34.0)
MCHC: 31.5 g/dL (ref 30.0–36.0)
MCV: 87.7 fL (ref 78.0–100.0)
PLATELETS: 252 10*3/uL (ref 150–400)
RBC: 4.46 MIL/uL (ref 4.22–5.81)
RDW: 14.4 % (ref 11.5–15.5)
WBC: 9.9 10*3/uL (ref 4.0–10.5)

## 2016-10-25 LAB — BASIC METABOLIC PANEL
Anion gap: 9 (ref 5–15)
BUN: 31 mg/dL — AB (ref 6–20)
CO2: 28 mmol/L (ref 22–32)
CREATININE: 2.37 mg/dL — AB (ref 0.61–1.24)
Calcium: 8.4 mg/dL — ABNORMAL LOW (ref 8.9–10.3)
Chloride: 101 mmol/L (ref 101–111)
GFR calc Af Amer: 31 mL/min — ABNORMAL LOW (ref >60)
GFR, EST NON AFRICAN AMERICAN: 27 mL/min — AB (ref >60)
GLUCOSE: 121 mg/dL — AB (ref 65–99)
POTASSIUM: 3.7 mmol/L (ref 3.5–5.1)
Sodium: 138 mmol/L (ref 135–145)

## 2016-10-25 LAB — SYNOVIAL CELL COUNT + DIFF, W/ CRYSTALS
LYMPHOCYTES-SYNOVIAL FLD: 1 % (ref 0–20)
Lymphocytes-Synovial Fld: 2 % (ref 0–20)
MONOCYTE-MACROPHAGE-SYNOVIAL FLUID: 24 % — AB (ref 50–90)
MONOCYTE-MACROPHAGE-SYNOVIAL FLUID: 6 % — AB (ref 50–90)
NEUTROPHIL, SYNOVIAL: 93 % — AB (ref 0–25)
Neutrophil, Synovial: 74 % — ABNORMAL HIGH (ref 0–25)
WBC, Synovial: 8450 /mm3 — ABNORMAL HIGH (ref 0–200)
WBC, Synovial: 3325 /mm3 — ABNORMAL HIGH (ref 0–200)

## 2016-10-25 MED ORDER — IOPAMIDOL (ISOVUE-300) INJECTION 61%
INTRAVENOUS | Status: AC
  Filled 2016-10-25: qty 100

## 2016-10-25 MED ORDER — BUPIVACAINE HCL 0.5 % IJ SOLN
10.0000 mL | Freq: Once | INTRAMUSCULAR | Status: AC
  Administered 2016-10-25: 10 mL
  Filled 2016-10-25 (×2): qty 10

## 2016-10-25 MED ORDER — IOPAMIDOL (ISOVUE-300) INJECTION 61%
INTRAVENOUS | Status: AC
  Filled 2016-10-25: qty 30

## 2016-10-25 MED ORDER — ENOXAPARIN SODIUM 40 MG/0.4ML ~~LOC~~ SOLN
40.0000 mg | SUBCUTANEOUS | Status: DC
  Administered 2016-10-25: 40 mg via SUBCUTANEOUS
  Filled 2016-10-25: qty 0.4

## 2016-10-25 MED ORDER — LIDOCAINE HCL 2 % IJ SOLN
10.0000 mL | Freq: Once | INTRAMUSCULAR | Status: AC
  Administered 2016-10-25: 200 mg
  Filled 2016-10-25: qty 10

## 2016-10-25 MED ORDER — METHYLPREDNISOLONE ACETATE 40 MG/ML IJ SUSP
80.0000 mg | Freq: Once | INTRAMUSCULAR | Status: AC
  Administered 2016-10-25: 80 mg via INTRA_ARTICULAR
  Filled 2016-10-25: qty 2

## 2016-10-25 NOTE — Care Management Obs Status (Signed)
[facility] NOTIFICATION   Patient Details  Name: [name] MRN: [id number] Date of Birth: April 27, 1951   Medicare Observation Status Notification Given:  Yes    Dawayne Patricia, RN 10/25/2016, 5:31 PM

## 2016-10-25 NOTE — Progress Notes (Signed)
PROGRESS NOTE    [name]  XMI:[id number] DOB: 11-Sep-1950 DOA: 10/24/2016 PCP: Biagio Borg, MD   Outpatient Specialists:     Brief Narrative:  [name] is a 66 y.o. male with medical history significant of CKD, HTN, HLD here with leg swelling and chest pain. Pt states his sx started as 1-2 weeks of bilateral, L>R leg pain and swelling. Pain started in his bilateral ankles but now extends to his b/l calves and popliteal region. He was seen by his PCP and started on allopurinol for possible gout which did not improve his sx. Over the past day, he has developed worsening b/l leg pain that is aching, throbbing, worse with movement. Earlier today, he also had acute onset of sharp, pleuritic, left-sided chest pain. Pain worse with movement, deep inspiration. It occurred after he had started eating. No nausea or vomiting. He had associated SOB and diaphoresis. Pain improved with ntiro and ASA by EMS. No other known relieving factors.  Patient states the pain was so sharp that he slid to the floor on his knees and decided to call EMS.  ED Course: Troponin was unremarkable VQ scan was ordered due to elevated d-dimer. In the emergency department chest pain was improved and referral was made to [facility] for further evaluation and management of chest pain and possible pulmonary embolism VQ scan is pending.   Assessment & Plan:   Principal Problem:   Chest pain Active Problems:   Essential hypertension   BPH (benign prostatic hyperplasia)   Osteoarthrosis, generalized, multiple joints   PERIPHERAL EDEMA   CKD (chronic kidney disease) stage 3, GFR 30-59 ml/min   Urinary retention due to benign prostatic hyperplasia   Chest pain: -CE negative -V/Q low risk  Peripheral edema:  -CT Scan of abd/pelvis -duplex negative for DVT- + for lymph nodes -PSA elevated -? Compression on IVC  B/l knee pain due to osteoarthritis -check x rays -left with swelling-- ? Gout-- will ask  ortho to tap and send for crystals  Essential hypertension -meotprolol  Benign prostatic hyperplasia -chronic indwelling Foley catheter.  Chronic kidney disease stage III:  - avoid nephrotoxic agents  20+ years of right parotid swelling -U/S negative for mass      DVT prophylaxis:  Lovenox   Code Status: Full Code   Family Communication:   Disposition Plan:     Consultants:      Subjective: C/o left > right knee swelling  Objective: Vitals:   10/24/16 2002 10/25/16 0024 10/25/16 0405 10/25/16 1059  BP: 137/87 134/81 (!) 130/92 (!) 140/95  Pulse: (!) 58 66 66 82  Resp: 18 17 18    Temp: 98.7 F (37.1 C) 99.4 F (37.4 C) 99.2 F (37.3 C)   TempSrc: Oral Oral Oral   SpO2: 96% 97% 96%   Weight:      Height:        Intake/Output Summary (Last 24 hours) at 10/25/16 1147 Last data filed at 10/25/16 1053  Gross per 24 hour  Intake                0 ml  Output             1775 ml  Net            -1775 ml   Filed Weights   10/24/16 0921  Weight: 115.7 kg (255 lb)    Examination:  General exam: Appears calm and comfortable  Respiratory system: Clear to auscultation. Respiratory effort normal. Cardiovascular  system: S1 & S2 heard, RRR. No JVD, murmurs, rubs, gallops or clicks. + edema in b/l LE. Gastrointestinal system: Abdomen is nondistended, soft and nontender. No organomegaly or masses felt. Normal bowel sounds heard. Central nervous system: Alert and oriented. No focal neurological deficits. Extremities: Symmetric 5 x 5 power. Psychiatry: Judgement and insight appear normal. Mood & affect appropriate.     Data Reviewed: I have personally reviewed following labs and imaging studies  CBC:  Recent Labs Lab 10/20/16 1555 10/24/16 0925 10/25/16 0916  WBC 9.0 13.2* 9.9  NEUTROABS 4.1 8.8*  --   HGB 13.1 12.0* 12.3*  HCT 40.4 37.9* 39.1  MCV 86.2 87.7 87.7  PLT 316.0 232 182   Basic Metabolic Panel:  Recent Labs Lab  10/20/16 1555 10/24/16 0925 10/25/16 0916  NA 139 133* 138  K 3.8 3.6 3.7  CL 99 100* 101  CO2 34* 25 28  GLUCOSE 86 135* 121*  BUN 39* 33* 31*  CREATININE 2.44* 2.69* 2.37*  CALCIUM 8.9 8.2* 8.4*   GFR: Estimated Creatinine Clearance: 40.9 mL/min (A) (by C-G formula based on SCr of 2.37 mg/dL (H)). Liver Function Tests:  Recent Labs Lab 10/20/16 1555 10/24/16 0925  AST 13 16  ALT 15 12*  ALKPHOS 90 64  BILITOT 0.6 1.4*  PROT 7.0 6.6  ALBUMIN 3.9 3.1*   No results for input(s): LIPASE, AMYLASE in the last 168 hours. No results for input(s): AMMONIA in the last 168 hours. Coagulation Profile: No results for input(s): INR, PROTIME in the last 168 hours. Cardiac Enzymes:  Recent Labs Lab 10/24/16 1622 10/24/16 1848 10/24/16 2134  TROPONINI <0.03 <0.03 <0.03   BNP (last 3 results) No results for input(s): PROBNP in the last 8760 hours. HbA1C: No results for input(s): HGBA1C in the last 72 hours. CBG:  Recent Labs Lab 10/24/16 2050  GLUCAP 138*   Lipid Profile: No results for input(s): CHOL, HDL, LDLCALC, TRIG, CHOLHDL, LDLDIRECT in the last 72 hours. Thyroid Function Tests: No results for input(s): TSH, T4TOTAL, FREET4, T3FREE, THYROIDAB in the last 72 hours. Anemia Panel: No results for input(s): VITAMINB12, FOLATE, FERRITIN, TIBC, IRON, RETICCTPCT in the last 72 hours. Urine analysis:    Component Value Date/Time   COLORURINE YELLOW 10/24/2016 1208   APPEARANCEUR CLOUDY (A) 10/24/2016 1208   LABSPEC 1.014 10/24/2016 1208   PHURINE 8.0 10/24/2016 1208   GLUCOSEU NEGATIVE 10/24/2016 1208   GLUCOSEU NEGATIVE 10/20/2016 1555   HGBUR SMALL (A) 10/24/2016 1208   HGBUR negative 01/29/2010 0000   BILIRUBINUR NEGATIVE 10/24/2016 [place] 10/24/2016 1208   PROTEINUR 30 (A) 10/24/2016 1208   UROBILINOGEN 0.2 10/20/2016 1555   NITRITE NEGATIVE 10/24/2016 1208   LEUKOCYTESUR LARGE (A) 10/24/2016 1208     )No results found for this or any  previous visit (from the past 240 hour(s)).    Anti-infectives    None       Radiology Studies: Dg Chest 2 View  Result Date: 10/24/2016 CLINICAL DATA:  Chest pain, shortness of breath EXAM: CHEST  2 VIEW COMPARISON:  05/27/2015 FINDINGS: Lungs are clear.  No pleural effusion or pneumothorax. Cardiomegaly. Visualized osseous structures are within normal limits. IMPRESSION: No evidence of acute cardiopulmonary disease. Electronically Signed   By: Julian Hy M.D.   On: 10/24/2016 11:37   Dg Knee 1-2 Views Left  Result Date: 10/25/2016 CLINICAL DATA:  She swelling, bilateral knee pain, left greater than right EXAM: LEFT KNEE - 1-2 VIEW COMPARISON:  None. FINDINGS:  Mild to moderate degenerative changes in all 3 compartments, most pronounced in the patellofemoral compartment. Large joint effusion. No acute bony abnormality. IMPRESSION: Mild to moderate tricompartment degenerative changes. Large joint effusion. No acute bony abnormality. Electronically Signed   By: Rolm Baptise M.D.   On: 10/25/2016 10:46   Dg Knee 1-2 Views Right  Result Date: 10/25/2016 CLINICAL DATA:  Swelling of the bilateral knees, left more than right. EXAM: RIGHT KNEE - 1-2 VIEW COMPARISON:  12/04/2007 and 10/12/2016 FINDINGS: Tricompartmental with moderate marginal spurring and generalized joint narrowing. Degenerative changes have progressed from 2009. Small if any joint effusion. No fracture deformity, erosion, or malalignment. IMPRESSION: Moderate tricompartmental osteoarthritis. No acute superimposed finding. Electronically Signed   By: Monte Fantasia M.D.   On: 10/25/2016 10:47   Nm Pulmonary Perf And Vent  Result Date: 10/24/2016 CLINICAL DATA:  New onset leg edema, which has gotten worse. Some centralized chest pain. Dizziness with a near syncopal episode. EXAM: NUCLEAR MEDICINE VENTILATION - PERFUSION LUNG SCAN TECHNIQUE: Ventilation images were obtained in multiple projections using inhaled aerosol Tc-74m  DTPA. Perfusion images were obtained in multiple projections after intravenous injection of Tc-50m MAA. RADIOPHARMACEUTICALS:  32.5 mCi Technetium-75m DTPA aerosol inhalation and 4.09 mCi Technetium-58m MAA IV COMPARISON:  Current chest radiograph FINDINGS: Ventilation: There are small patchy areas of relative decreased ventilation in the lungs. Ventilation otherwise unremarkable. Perfusion: There are small nonsegmental patchy areas of relative decreased profusion in the lungs which matches the areas of relative decreased ventilation. There are no segmental perfusion defects, mismatched to ventilation, to suggest pulmonary embolism. IMPRESSION: Low probability ventilation-perfusion study for pulmonary thromboembolism. Electronically Signed   By: Lajean Manes M.D.   On: 10/24/2016 13:42   US Soft Tissue Neck  Result Date: 10/24/2016 CLINICAL DATA:  Goiter. Palpable neck mass felt by the patient for 15 years. EXAM: ULTRASOUND OF HEAD/NECK SOFT TISSUES TECHNIQUE: Ultrasound examination of the head and neck soft tissues was performed in the area of clinical concern. COMPARISON:  None. FINDINGS: The palpable abnormality corresponds to the right parotid gland. No parotid gland mass. There are no adjacent masses or enlarged lymph nodes. A single normal size normal morphology node lies in close proximity, measuring 11 mm in long axis and 3.5 mm in short axis. IMPRESSION: 1. The palpable abnormality appears to correspond to the right parotid gland, which is normal appearance on ultrasound. No parotid mass. No enlarged or abnormal lymph nodes. Electronically Signed   By: Lajean Manes M.D.   On: 10/24/2016 14:18        Scheduled Meds: . allopurinol  100 mg Oral Daily  . amLODipine  5 mg Oral Daily  . aspirin EC  325 mg Oral Daily  . atorvastatin  40 mg Oral Daily  . enoxaparin (LOVENOX) injection  40 mg Subcutaneous Q24H  . iopamidol      . metoprolol tartrate  25 mg Oral BID   Continuous Infusions:    LOS: 0 days    Time spent: 25 min    [place], DO [facility] Pager [phone]  If 7PM-7AM, please contact night-coverage [email or web address] Password [facility] 10/25/2016, 11:47 AM

## 2016-10-25 NOTE — Consult Note (Signed)
Reason for Consult:Bilateral knee effusions Referring Physician: Willow [name] is an 66 y.o. male.  HPI: [name] began to have severe bilateral LE pain and swelling about 3d prior to admission. It has gradually gotten worse. It had been bothering him for about a week before that but not to this severity. He has had episodes in the past, again never this bad. Prior to this year it would happen 1-2x/year. This year it seems like it's happening monthly. He was given some allopurinol but it did not help and he sought care in the ED. He had some associated N/V, chest pain, SOB, and diaphoresis. Cardiopulmonary workup has been negative. X-rays showed bilateral arthritis and significant effusions and orthopedic surgery was consulted.  Past Medical History:  Diagnosis Date  . Anemia    normal Fe, nl B12, nl retic, nl EPO July '13  . Blood transfusion without reported diagnosis   . BPH (benign prostatic hyperplasia)   . Chronic back pain   . Chronic kidney disease    CKD III, obstructive nephropathy  . Diverticulosis   . Dysuria   . Elevated PSA, greater than or equal to 20 ng/ml June '13   PSA 107  . Hemorrhoids, internal, with bleeding, prolapse 09/19/2014  . Hyperlipidemia 05/29/2014  . Hypertension   . Obstructive uropathy 11/27/2015  . Renal insufficiency   . Tuberculosis    h/o PPD +  . UTI (urinary tract infection)     Past Surgical History:  Procedure Laterality Date  . CARPAL TUNNEL RELEASE Right   . COLONOSCOPY    . HEMORRHOID BANDING    . PROSTATE SURGERY    . SPLENECTOMY      Family History  Problem Relation Age of Onset  . Diabetes Mother   . Stroke Brother   . Hearing loss Brother   . Colon cancer Neg Hx   . Rectal cancer Neg Hx   . Stomach cancer Neg Hx   . Esophageal cancer Neg Hx     Social History:  reports that he has quit smoking. His smoking use included Cigarettes. He has never used smokeless tobacco. He reports that he does not drink alcohol or use  drugs.  Allergies: No Known Allergies  Medications: I have reviewed the patient's current medications.  Results for orders placed or performed during the hospital encounter of 10/24/16 (from the past 48 hour(s))  CBC with Differential     Status: Abnormal   Collection Time: 10/24/16  9:25 AM  Result Value Ref Range   WBC 13.2 (H) 4.0 - 10.5 K/uL   RBC 4.32 4.22 - 5.81 MIL/uL   Hemoglobin 12.0 (L) 13.0 - 17.0 g/dL   HCT 37.9 (L) 39.0 - 52.0 %   MCV 87.7 78.0 - 100.0 fL   MCH 27.8 26.0 - 34.0 pg   MCHC 31.7 30.0 - 36.0 g/dL   RDW 14.2 11.5 - 15.5 %   Platelets 232 150 - 400 K/uL   Neutrophils Relative % 67 %   Neutro Abs 8.8 (H) 1.7 - 7.7 K/uL   Lymphocytes Relative 21 %   Lymphs Abs 2.8 0.7 - 4.0 K/uL   Monocytes Relative 10 %   Monocytes Absolute 1.4 (H) 0.1 - 1.0 K/uL   Eosinophils Relative 2 %   Eosinophils Absolute 0.2 0.0 - 0.7 K/uL   Basophils Relative 0 %   Basophils Absolute 0.0 0.0 - 0.1 K/uL  Comprehensive metabolic panel     Status: Abnormal   Collection Time:  10/24/16  9:25 AM  Result Value Ref Range   Sodium 133 (L) 135 - 145 mmol/L   Potassium 3.6 3.5 - 5.1 mmol/L   Chloride 100 (L) 101 - 111 mmol/L   CO2 25 22 - 32 mmol/L   Glucose, Bld 135 (H) 65 - 99 mg/dL   BUN 33 (H) 6 - 20 mg/dL   Creatinine, Ser 2.69 (H) 0.61 - 1.24 mg/dL   Calcium 8.2 (L) 8.9 - 10.3 mg/dL   Total Protein 6.6 6.5 - 8.1 g/dL   Albumin 3.1 (L) 3.5 - 5.0 g/dL   AST 16 15 - 41 U/L   ALT 12 (L) 17 - 63 U/L   Alkaline Phosphatase 64 38 - 126 U/L   Total Bilirubin 1.4 (H) 0.3 - 1.2 mg/dL   GFR calc non Af Amer 23 (L) >60 mL/min   GFR calc Af Amer 27 (L) >60 mL/min    Comment: (NOTE) The eGFR has been calculated using the CKD EPI equation. This calculation has not been validated in all clinical situations. eGFR's persistently <60 mL/min signify possible Chronic Kidney Disease.    Anion gap 8 5 - 15  Brain natriuretic peptide     Status: None   Collection Time: 10/24/16  9:25 AM   Result Value Ref Range   B Natriuretic Peptide 51.0 0.0 - 100.0 pg/mL  D-dimer, quantitative (not at [facility])     Status: Abnormal   Collection Time: 10/24/16  9:25 AM  Result Value Ref Range   D-Dimer, Quant 3.27 (H) 0.00 - 0.50 ug/mL-FEU    Comment: (NOTE) At the manufacturer cut-off of 0.50 ug/mL FEU, this assay has been documented to exclude PE with a sensitivity and negative predictive value of 97 to 99%.  At this time, this assay has not been approved by the FDA to exclude DVT/VTE. Results should be correlated with clinical presentation.   I-Stat Troponin, ED (not at [facility])     Status: None   Collection Time: 10/24/16  9:41 AM  Result Value Ref Range   Troponin i, poc 0.00 0.00 - 0.08 ng/mL   Comment 3            Comment: Due to the release kinetics of cTnI, a negative result within the first hours of the onset of symptoms does not rule out myocardial infarction with certainty. If myocardial infarction is still suspected, repeat the test at appropriate intervals.   Urinalysis, Routine w reflex microscopic     Status: Abnormal   Collection Time: 10/24/16 12:08 PM  Result Value Ref Range   Color, Urine YELLOW YELLOW   APPearance CLOUDY (A) CLEAR   Specific Gravity, Urine 1.014 1.005 - 1.030   pH 8.0 5.0 - 8.0   Glucose, UA NEGATIVE NEGATIVE mg/dL   Hgb urine dipstick SMALL (A) NEGATIVE   Bilirubin Urine NEGATIVE NEGATIVE   Ketones, ur NEGATIVE NEGATIVE mg/dL   Protein, ur 30 (A) NEGATIVE mg/dL   Nitrite NEGATIVE NEGATIVE   Leukocytes, UA LARGE (A) NEGATIVE   RBC / HPF 0-5 0 - 5 RBC/hpf   WBC, UA 6-30 0 - 5 WBC/hpf   Bacteria, UA RARE (A) NONE SEEN   Squamous Epithelial / LPF 0-5 (A) NONE SEEN   Mucous PRESENT    Triple Phosphate Crystal PRESENT   Troponin I-serum (0, 3, 6 hours)     Status: None   Collection Time: 10/24/16  4:22 PM  Result Value Ref Range   Troponin I <0.03 <0.03 ng/mL  Troponin  I-serum (0, 3, 6 hours)     Status: None   Collection Time: 10/24/16   6:48 PM  Result Value Ref Range   Troponin I <0.03 <0.03 ng/mL  Glucose, capillary     Status: Abnormal   Collection Time: 10/24/16  8:50 PM  Result Value Ref Range   Glucose-Capillary 138 (H) 65 - 99 mg/dL  Troponin I-serum (0, 3, 6 hours)     Status: None   Collection Time: 10/24/16  9:34 PM  Result Value Ref Range   Troponin I <0.03 <0.03 ng/mL  CBC     Status: Abnormal   Collection Time: 10/25/16  9:16 AM  Result Value Ref Range   WBC 9.9 4.0 - 10.5 K/uL   RBC 4.46 4.22 - 5.81 MIL/uL   Hemoglobin 12.3 (L) 13.0 - 17.0 g/dL   HCT 39.1 39.0 - 52.0 %   MCV 87.7 78.0 - 100.0 fL   MCH 27.6 26.0 - 34.0 pg   MCHC 31.5 30.0 - 36.0 g/dL   RDW 14.4 11.5 - 15.5 %   Platelets 252 150 - 400 K/uL  Basic metabolic panel     Status: Abnormal   Collection Time: 10/25/16  9:16 AM  Result Value Ref Range   Sodium 138 135 - 145 mmol/L   Potassium 3.7 3.5 - 5.1 mmol/L   Chloride 101 101 - 111 mmol/L   CO2 28 22 - 32 mmol/L   Glucose, Bld 121 (H) 65 - 99 mg/dL   BUN 31 (H) 6 - 20 mg/dL   Creatinine, Ser 2.37 (H) 0.61 - 1.24 mg/dL   Calcium 8.4 (L) 8.9 - 10.3 mg/dL   GFR calc non Af Amer 27 (L) >60 mL/min   GFR calc Af Amer 31 (L) >60 mL/min    Comment: (NOTE) The eGFR has been calculated using the CKD EPI equation. This calculation has not been validated in all clinical situations. eGFR's persistently <60 mL/min signify possible Chronic Kidney Disease.    Anion gap 9 5 - 15    Dg Chest 2 View  Result Date: 10/24/2016 CLINICAL DATA:  Chest pain, shortness of breath EXAM: CHEST  2 VIEW COMPARISON:  05/27/2015 FINDINGS: Lungs are clear.  No pleural effusion or pneumothorax. Cardiomegaly. Visualized osseous structures are within normal limits. IMPRESSION: No evidence of acute cardiopulmonary disease. Electronically Signed   By: Julian Hy M.D.   On: 10/24/2016 11:37   Dg Knee 1-2 Views Left  Result Date: 10/25/2016 CLINICAL DATA:  She swelling, bilateral knee pain, left greater  than right EXAM: LEFT KNEE - 1-2 VIEW COMPARISON:  None. FINDINGS: Mild to moderate degenerative changes in all 3 compartments, most pronounced in the patellofemoral compartment. Large joint effusion. No acute bony abnormality. IMPRESSION: Mild to moderate tricompartment degenerative changes. Large joint effusion. No acute bony abnormality. Electronically Signed   By: Rolm Baptise M.D.   On: 10/25/2016 10:46   Dg Knee 1-2 Views Right  Result Date: 10/25/2016 CLINICAL DATA:  Swelling of the bilateral knees, left more than right. EXAM: RIGHT KNEE - 1-2 VIEW COMPARISON:  12/04/2007 and 10/12/2016 FINDINGS: Tricompartmental with moderate marginal spurring and generalized joint narrowing. Degenerative changes have progressed from 2009. Small if any joint effusion. No fracture deformity, erosion, or malalignment. IMPRESSION: Moderate tricompartmental osteoarthritis. No acute superimposed finding. Electronically Signed   By: Monte Fantasia M.D.   On: 10/25/2016 10:47   Nm Pulmonary Perf And Vent  Result Date: 10/24/2016 CLINICAL DATA:  New onset leg edema, which has  gotten worse. Some centralized chest pain. Dizziness with a near syncopal episode. EXAM: NUCLEAR MEDICINE VENTILATION - PERFUSION LUNG SCAN TECHNIQUE: Ventilation images were obtained in multiple projections using inhaled aerosol Tc-80mDTPA. Perfusion images were obtained in multiple projections after intravenous injection of Tc-985mAA. RADIOPHARMACEUTICALS:  32.5 mCi Technetium-9956mPA aerosol inhalation and 4.09 mCi Technetium-38m2m IV COMPARISON:  Current chest radiograph FINDINGS: Ventilation: There are small patchy areas of relative decreased ventilation in the lungs. Ventilation otherwise unremarkable. Perfusion: There are small nonsegmental patchy areas of relative decreased profusion in the lungs which matches the areas of relative decreased ventilation. There are no segmental perfusion defects, mismatched to ventilation, to suggest  pulmonary embolism. IMPRESSION: Low probability ventilation-perfusion study for pulmonary thromboembolism. Electronically Signed   By: DaviLajean Manes.   On: 10/24/2016 13:42   Us SKoreat Tissue Neck  Result Date: 10/24/2016 CLINICAL DATA:  Goiter. Palpable neck mass felt by the patient for 15 years. EXAM: ULTRASOUND OF HEAD/NECK SOFT TISSUES TECHNIQUE: Ultrasound examination of the head and neck soft tissues was performed in the area of clinical concern. COMPARISON:  None. FINDINGS: The palpable abnormality corresponds to the right parotid gland. No parotid gland mass. There are no adjacent masses or enlarged lymph nodes. A single normal size normal morphology node lies in close proximity, measuring 11 mm in long axis and 3.5 mm in short axis. IMPRESSION: 1. The palpable abnormality appears to correspond to the right parotid gland, which is normal appearance on ultrasound. No parotid mass. No enlarged or abnormal lymph nodes. Electronically Signed   By: DaviLajean Manes.   On: 10/24/2016 14:18    Review of Systems  Constitutional: Positive for diaphoresis. Negative for weight loss.  HENT: Negative for ear discharge, ear pain, hearing loss and tinnitus.   Eyes: Negative for blurred vision, double vision, photophobia and pain.  Respiratory: Positive for shortness of breath. Negative for cough and sputum production.   Cardiovascular: Positive for chest pain.  Gastrointestinal: Positive for nausea and vomiting. Negative for abdominal pain.  Genitourinary: Positive for dysuria. Negative for flank pain, frequency and urgency.  Musculoskeletal: Positive for joint pain (Bilateral knees/ankles/feet). Negative for back pain, falls, myalgias and neck pain.  Neurological: Negative for dizziness, tingling, sensory change, focal weakness, loss of consciousness and headaches.  Endo/Heme/Allergies: Does not bruise/bleed easily.  Psychiatric/Behavioral: Negative for depression, memory loss and substance abuse. The  patient is not nervous/anxious.    Blood pressure (!) 140/95, pulse 82, temperature 99.2 F (37.3 C), temperature source Oral, resp. rate 18, height 6' 1" (1.854 m), weight 115.7 kg (255 lb), SpO2 96 %. Physical Exam  Constitutional: He appears well-developed and well-nourished. No distress.  HENT:  Head: Normocephalic.  Eyes: Conjunctivae are normal. Right eye exhibits no discharge. Left eye exhibits no discharge. No scleral icterus.  Cardiovascular: Normal rate and regular rhythm.   Respiratory: Effort normal. No respiratory distress.  Musculoskeletal:  RLE No traumatic wounds, ecchymosis, or rash  Foot/ankle severe TTP, knee moderate TTP  Moderate effusion, no erythema or warmth  Knee stable to varus/ valgus and anterior/posterior stress but painful  Sens DPN, SPN, TN paresthetic but unable to qualify  Motor EHL, ext, flex, evers 5/5  DP 2+, PT 2+, No significant edema   LLE No traumatic wounds, ecchymosis, or rash  Foot/ankle severe TTP, knee mild TTP  Moderate effusion  Knee stable to varus/ valgus and anterior/posterior stress but moderately painful  Sens DPN, SPN, TN paresthetic but unable to qualify  Motor  EHL, ext, flex, evers 5/5  DP 2+, PT 2+, No significant edema  Neurological: He is alert.  Skin: Skin is warm and dry. He is not diaphoretic.  Psychiatric: He has a normal mood and affect. His behavior is normal.    Assessment/Plan: Bilateral knee arthritis with effusions -- Will plan aspiration and injection with anesthetic/steroid mixture if not septic.    Lisette Abu, PA-C Orthopedic Surgery [phone] 10/25/2016, 12:37 PM

## 2016-10-25 NOTE — Procedures (Signed)
Procedure: Bilateral knee aspiration and injection  Indication: Bilateral knee effusions  Surgeon: Silvestre Gunner, PA-C  Assist: None  Anesthesia: None  EBL: None  Complications: None  Findings: After risks/benefits explained patient desires to undergo procedure. Consent obtained and time out performed. Left knee was sterilely prepped and aspirated. Nearly 60ml clear synovial fluid obtained, 91ml mixture of lidocaine, marcaine, and 40mg  steroid injected. Attention then turned to right knee. Same procedure followed, about 57ml aspirated and then same medications injected. Pt tolerated the procedure well.    Lisette Abu, PA-C Orthopedic Surgery [phone]

## 2016-10-26 DIAGNOSIS — I10 Essential (primary) hypertension: Secondary | ICD-10-CM | POA: Diagnosis not present

## 2016-10-26 DIAGNOSIS — M1039 Gout due to renal impairment, multiple sites: Secondary | ICD-10-CM

## 2016-10-26 DIAGNOSIS — M25462 Effusion, left knee: Secondary | ICD-10-CM | POA: Diagnosis not present

## 2016-10-26 DIAGNOSIS — M25461 Effusion, right knee: Secondary | ICD-10-CM | POA: Diagnosis not present

## 2016-10-26 DIAGNOSIS — N183 Chronic kidney disease, stage 3 (moderate): Secondary | ICD-10-CM | POA: Diagnosis not present

## 2016-10-26 DIAGNOSIS — R079 Chest pain, unspecified: Secondary | ICD-10-CM | POA: Diagnosis not present

## 2016-10-26 DIAGNOSIS — N401 Enlarged prostate with lower urinary tract symptoms: Secondary | ICD-10-CM | POA: Diagnosis not present

## 2016-10-26 LAB — BASIC METABOLIC PANEL
Anion gap: 10 (ref 5–15)
BUN: 35 mg/dL — AB (ref 6–20)
CALCIUM: 8.6 mg/dL — AB (ref 8.9–10.3)
CHLORIDE: 99 mmol/L — AB (ref 101–111)
CO2: 26 mmol/L (ref 22–32)
CREATININE: 2.56 mg/dL — AB (ref 0.61–1.24)
GFR calc Af Amer: 28 mL/min — ABNORMAL LOW (ref >60)
GFR calc non Af Amer: 25 mL/min — ABNORMAL LOW (ref >60)
Glucose, Bld: 164 mg/dL — ABNORMAL HIGH (ref 65–99)
Potassium: 4.6 mmol/L (ref 3.5–5.1)
Sodium: 135 mmol/L (ref 135–145)

## 2016-10-26 LAB — CBC
HEMATOCRIT: 39.6 % (ref 39.0–52.0)
HEMOGLOBIN: 12.6 g/dL — AB (ref 13.0–17.0)
MCH: 27.8 pg (ref 26.0–34.0)
MCHC: 31.8 g/dL (ref 30.0–36.0)
MCV: 87.4 fL (ref 78.0–100.0)
Platelets: 238 10*3/uL (ref 150–400)
RBC: 4.53 MIL/uL (ref 4.22–5.81)
RDW: 14.3 % (ref 11.5–15.5)
WBC: 12.3 10*3/uL — ABNORMAL HIGH (ref 4.0–10.5)

## 2016-10-26 MED ORDER — PREDNISONE 20 MG PO TABS: ORAL_TABLET | ORAL | 0 refills | Status: DC

## 2016-10-26 MED ORDER — PREDNISONE 20 MG PO TABS
20.0000 mg | ORAL_TABLET | Freq: Once | ORAL | Status: AC
  Administered 2016-10-26: 20 mg via ORAL

## 2016-10-26 MED ORDER — METOPROLOL TARTRATE 25 MG PO TABS: 25.0000 mg | ORAL_TABLET | Freq: Two times a day (BID) | ORAL | 0 refills | Status: DC

## 2016-10-26 MED ORDER — PREDNISONE 20 MG PO TABS
60.0000 mg | ORAL_TABLET | Freq: Every day | ORAL | Status: DC
  Administered 2016-10-26: 60 mg via ORAL

## 2016-10-26 MED ORDER — PREDNISONE 20 MG PO TABS
40.0000 mg | ORAL_TABLET | Freq: Every day | ORAL | Status: DC
  Administered 2016-10-26: 40 mg via ORAL
  Filled 2016-10-26: qty 2

## 2016-10-26 NOTE — Care Management Note (Signed)
Case Management Note Marvetta Gibbons RN, BSN Unit 2W-Case Manager [phone]  Patient Details  Name: [name] MRN: [id number] Date of Birth: April 15, 1951  Subjective/Objective:    Pt presented with chest pain and knee pain                Action/Plan: PTA pt lived at home- has a roommate,  plan to return home- per PT eval recommendation for [facility] services- order has been placed for HHPT- and DME- RW/3n1- spoke with pt at bedside- choice offered for [facility] agencies in [facility]- per pt choice he would like to use [facility] for services- referral called to Santiago Glad with [facility] - referral accepted- also notified karen for DME needs- RW and 3n1- these will be delivered to room prior to discharge.   Expected Discharge Date:  10/26/16               Expected Discharge Plan:  [place]  In-House Referral:     Discharge planning Services     Post Acute Care Choice:  Durable Medical Equipment, Home Health Choice offered to:  Patient  DME Arranged:  3-N-1, Walker rolling DME Agency:  [place]:  PT [facility] Agency:  [place]  Status of Service:  Completed, signed off  If discussed at [place] of Stay Meetings, dates discussed:    Discharge Disposition: home with home health   Additional Comments:  Dawayne Patricia, RN 10/26/2016, 4:22 PM

## 2016-10-26 NOTE — Evaluation (Signed)
Physical Therapy Evaluation Patient Details Name: [name] MRN: [id number] DOB: 08-26-1950 Today's Date: 10/26/2016   History of Present Illness  Donald Nyadzoris a 66 y.o.malewith medical history significant ofCKD, HTN, HLD here with leg swelling and chest pain. Pt underwent bilat knee aspirations on 5/21.  Clinical Impression  Pt mobility limited by bilat knee pain. Pt currently required RW for safe ambulation. Pt to benefit from HHPT to progress bilat knee ROM and improve indep with mobility.    Follow Up Recommendations Home health PT;Supervision - Intermittent    Equipment Recommendations  Rolling walker with 5" wheels;3in1 (PT)    Recommendations for Other Services       Precautions / Restrictions Precautions Precautions: Fall Precaution Comments: limited bilat knee ROM Restrictions Weight Bearing Restrictions: No (bilat LE WBAT, limited by pain)      Mobility  Bed Mobility Overal bed mobility: Modified Independent             General bed mobility comments: used bed rail, HOB elevated, increased time, able to bring legs off EOB  Transfers Overall transfer level: Needs assistance Equipment used: Rolling walker (2 wheeled) Transfers: Sit to/from Stand Sit to Stand: Min guard         General transfer comment: v/c's for safe hand placement, increased time. pt elevated bed very high  Ambulation/Gait Ambulation/Gait assistance: Supervision Ambulation Distance (Feet): 225 Feet Assistive device: Rolling walker (2 wheeled) Gait Pattern/deviations: Step-through pattern;Decreased stride length;Antalgic Gait velocity: slow Gait velocity interpretation: Below normal speed for age/gender General Gait Details: dependent on UE WBing to off weight LEs, trunk flexed, no episode of LOB but requires RW  Stairs Stairs: Yes Stairs assistance: Min guard Stair Management: One rail Left;Sideways;Step to pattern Number of Stairs: 4 General stair comments: slow and  really had to pull up on railing but able to complete, sequencign up with the R down with the L  Wheelchair Mobility    Modified Rankin (Stroke Patients Only)       Balance Overall balance assessment:  (needs RW for safe amb)                                           Pertinent Vitals/Pain Pain Assessment: 0-10 Pain Score: 8  Pain Location: bilat knees Pain Descriptors / Indicators: Aching Pain Intervention(s): Monitored during session    Home Living Family/patient expects to be discharged to:: Private residence Living Arrangements: Non-relatives/Friends (roomate) Available Help at Discharge: Friend(s);Available PRN/intermittently Type of Home:  (townhome) Home Access: Level entry     Home Layout: Two level Home Equipment: Cane - single point      Prior Function Level of Independence: Independent         Comments: needed to use cane the last week     Hand Dominance   Dominant Hand: Right    Extremity/Trunk Assessment   Upper Extremity Assessment Upper Extremity Assessment: Overall WFL for tasks assessed    Lower Extremity Assessment Lower Extremity Assessment: RLE deficits/detail;LLE deficits/detail RLE Deficits / Details: grossly 3+/5, limited knee flexion due to onset of pain, unable to resist knee flexion/ext due to pain LLE Deficits / Details: noted edema, limited knee flexion to 45 deg due to pain/swelling,     Cervical / Trunk Assessment Cervical / Trunk Assessment: Normal  Communication   Communication: No difficulties  Cognition Arousal/Alertness: Awake/alert Behavior During Therapy: WFL for tasks assessed/performed Overall  Cognitive Status: Within Functional Limits for tasks assessed                                        General Comments General comments (skin integrity, edema, etc.): pt with bilat knee edema L worse than R    Exercises     Assessment/Plan    PT Assessment Patient needs continued PT  services  PT Problem List Decreased strength;Decreased range of motion;Decreased activity tolerance;Decreased balance;Decreased mobility;Decreased knowledge of use of DME       PT Treatment Interventions DME instruction;Gait training;Stair training;Functional mobility training;Therapeutic activities;Therapeutic exercise;Balance training;Neuromuscular re-education    PT Goals (Current goals can be found in the Care Plan section)  Acute Rehab PT Goals Patient Stated Goal: stop the pain and go home in the morning PT Goal Formulation: With patient Time For Goal Achievement: 11/02/16 Potential to Achieve Goals: Good    Frequency Min 3X/week   Barriers to discharge Decreased caregiver support home alone most of time    Co-evaluation               AM-PAC PT "6 Clicks" Daily Activity  Outcome Measure Difficulty turning over in bed (including adjusting bedclothes, sheets and blankets)?: None Difficulty moving from lying on back to sitting on the side of the bed? : None Difficulty sitting down on and standing up from a chair with arms (e.g., wheelchair, bedside commode, etc,.)?: A Little Help needed moving to and from a bed to chair (including a wheelchair)?: A Little Help needed walking in hospital room?: A Little Help needed climbing 3-5 steps with a railing? : A Little 6 Click Score: 20    End of Session Equipment Utilized During Treatment: Gait belt Activity Tolerance: Patient tolerated treatment well Patient left: in chair;with call bell/phone within reach Nurse Communication: Mobility status PT Visit Diagnosis: Pain;Difficulty in walking, not elsewhere classified (R26.2) Pain - Right/Left: Left Pain - part of body: Knee    Time: 2458-0998 PT Time Calculation (min) (ACUTE ONLY): 25 min   Charges:   PT Evaluation $PT Eval Moderate Complexity: 1 Procedure PT Treatments $Gait Training: 8-22 mins   PT G Codes:   PT G-Codes **NOT FOR INPATIENT CLASS** Functional  Assessment Tool Used: Clinical judgement Functional Limitation: Mobility: Walking and moving around Mobility: Walking and Moving Around Current Status (P3825): At least 40 percent but less than 60 percent impaired, limited or restricted Mobility: Walking and Moving Around Goal Status [phone]): At least 1 percent but less than 20 percent impaired, limited or restricted    Kittie Plater, PT, DPT Pager #: [phone] Office #: [phone]   [place] 10/26/2016, 12:48 PM

## 2016-10-26 NOTE — Progress Notes (Signed)
Patient ID: [name], male   DOB: 21-Dec-1950, 66 y.o.   MRN: [id number]   LOS: 0 days   Subjective: [name] is feeling much better this morning. Pain is greatly improved, especially on the right. He was able to bear weight this morning.   Objective: Vital signs in last 24 hours: Temp:  [98 F (36.7 C)-102.2 F (39 C)] 98 F (36.7 C) (05/22 0620) Pulse Rate:  [64-89] 64 (05/22 0620) Resp:  [17-20] 17 (05/22 0620) BP: (133-157)/(79-95) 138/88 (05/22 0620) SpO2:  [95 %-98 %] 95 % (05/22 0620) Last BM Date: 10/23/16   Laboratory  CBC  Recent Labs  10/25/16 0916 10/26/16 0204  WBC 9.9 12.3*  HGB 12.3* 12.6*  HCT 39.1 39.6  PLT 252 238   BMET  Recent Labs  10/25/16 0916 10/26/16 0204  NA 138 135  K 3.7 4.6  CL 101 99*  CO2 28 26  GLUCOSE 121* 164*  BUN 31* 35*  CREATININE 2.37* 2.56*  CALCIUM 8.4* 8.6*    Physical Exam General appearance: alert and no distress RLE No traumatic wounds, ecchymosis, or rash  Tenderness improved though still present in foot/calf  Effusion continues  Knee stable to varus/ valgus and anterior/posterior stress  Sens DPN, SPN, TN intact  Motor EHL, ext, flex, evers 5/5  DP 2+, PT 2+, No significant edema   LLE No traumatic wounds, ecchymosis, or rash  Tenderness improved though still present in foot/calf  Effusion continues  Knee stable to varus/ valgus and anterior/posterior stress  Sens DPN, SPN, TN intact  Motor EHL, ext, flex, evers 5/5  DP 2+, PT 2+, No significant edema   Assessment/Plan: Gout flare -- Synovial analysis c/w gout. NSAID's and colchicine relatively contraindicated with his CKD. Will treat with oral steroids (I see IM has already instituted this, might recommend 60mg  a day for 10d then a short taper). Continue allopurinol. May follow up with Dr. Marlou Sa as needed though his PCP can also manage. Orthopedics will sign off.    Lisette Abu, PA-C Orthopedic Surgery [phone] 10/26/2016

## 2016-10-26 NOTE — Discharge Summary (Signed)
Physician Discharge Summary  [name]:[id number] DOB: [birth year] DOA: 10/24/2016  PCP: Biagio Borg, MD  Admit date: 10/24/2016 Discharge date: 10/26/2016   Recommendations for Outpatient Follow-Up:   1. Steroid taper for gout flare 2. Home health   Discharge Diagnosis:   Principal Problem:   Chest pain Active Problems:   Essential hypertension   BPH (benign prostatic hyperplasia)   Osteoarthrosis, generalized, multiple joints   PERIPHERAL EDEMA   CKD (chronic kidney disease) stage 3, GFR 30-59 ml/min   Urinary retention due to benign prostatic hyperplasia   Discharge disposition:  Home  Discharge Condition: Improved.  Diet recommendation: Low sodium, heart healthy.  Carbohydrate-modified  Wound care: None.   History of Present Illness:   [name] is a 66 y.o. male with medical history significant of CKD, HTN, HLD here with leg swelling and chest pain. Pt states his sx started as 1-2 weeks of bilateral, L>R leg pain and swelling. Pain started in his bilateral ankles but now extends to his b/l calves and popliteal region. He was seen by his PCP and started on allopurinol for possible gout which did not improve his sx. Over the past day, he has developed worsening b/l leg pain that is aching, throbbing, worse with movement. Earlier today, he also had acute onset of sharp, pleuritic, left-sided chest pain. Pain worse with movement, deep inspiration. It occurred after he had started eating. No nausea or vomiting. He had associated SOB and diaphoresis. Pain improved with ntiro and ASA by EMS. No other known relieving factors.  Patient states the pain was so sharp that he slid to the floor on his knees and decided to call EMS.    Hospital Course by Problem:   Chest pain: -CE negative -V/Q low risk -resolved  Gout: Unable to get NSAIDs -prednisone taper per ortho (while on prednisone patient to follow diabetic diet)  Peripheral edema:  -CT Scan of  abd/pelvis no obstruction -duplex negative for DVT- + for lymph nodes -PSA elevated-- has urology follow up already  B/l knee pain due to osteoarthritis/gout -x ray shows fluid -s/p tap and consistent with gout  Essential hypertension -metoprolol  Benign prostatic hyperplasia -chronic indwelling Foley catheter. -large prostate- has follow up with urology  Chronic kidney disease stage III:  - avoid nephrotoxic agents  20+ years of right parotid swelling -U/S negative for mass -outpatient follow up    Medical Consultants:    ortho   Discharge Exam:   Vitals:   10/26/16 0620 10/26/16 1028  BP: 138/88 135/88  Pulse: 64 (!) 59  Resp: 17   Temp: 98 F (36.7 C)    Vitals:   10/25/16 1716 10/25/16 2011 10/26/16 0620 10/26/16 1028  BP:  133/79 138/88 135/88  Pulse:  71 64 (!) 59  Resp:  18 17   Temp: 99.8 F (37.7 C) 98.3 F (36.8 C) 98 F (36.7 C)   TempSrc:  Oral Oral   SpO2:  96% 95%   Weight:      Height:        Gen:  NAD- moving better   The results of significant diagnostics from this hospitalization (including imaging, microbiology, ancillary and laboratory) are listed below for reference.     Procedures and Diagnostic Studies:   Ct Abdomen Pelvis Wo Contrast  Result Date: 10/25/2016 CLINICAL DATA:  Inpatient admitted with lower extremity swelling and chest pain. Reported history is "lymph nodes enlarged." History splenectomy, chronic kidney disease and prostate surgery. EXAM: CT ABDOMEN  AND PELVIS WITHOUT CONTRAST TECHNIQUE: Multidetector CT imaging of the abdomen and pelvis was performed following the standard protocol without IV contrast. COMPARISON:  06/06/2015 renal sonogram. 05/27/2015 abdominal radiographs. 11/24/2011 CT abdomen/ pelvis. FINDINGS: Motion degraded scan. Lower chest: Trace dependent pleural effusions, right greater than left with minimal dependent bilateral lower lobe atelectasis. Mild cardiomegaly. Hepatobiliary: Normal liver  size. No appreciable liver mass. The previously visualized liver cysts on 11/24/2011 CT are not discretely visualized on this scan. Normal gallbladder with no radiopaque cholelithiasis. No biliary ductal dilatation. Pancreas: Normal, with no mass or duct dilation. Spleen: Status post splenectomy. A few clustered splenules in the anterior left upper quadrant measuring up to 4.5 cm. Adrenals/Urinary Tract: No discrete adrenal nodules. Lobulated renal contours. Severe right and moderate left renal atrophy. Suggestion of a 1.2 cm simple renal cyst in the posterior lower left kidney. No additional contour deforming renal mass. No renal stones. Fullness of the right renal collecting system without overt right hydronephrosis. No left hydronephrosis. Bladder is completely collapsed by indwelling Foley catheter. Suggestion of stable chronic diffuse bladder wall thickening. Stomach/Bowel: Grossly normal stomach. Normal caliber small bowel with no small bowel wall thickening. Normal appendix. Oral contrast progresses to the transverse colon. Mild diverticulosis of the splenic flexure of the colon, with no large bowel wall thickening or pericolonic fat stranding. Vascular/Lymphatic: Atherosclerotic nonaneurysmal abdominal aorta. No pathologically enlarged lymph nodes in the abdomen or pelvis. Reproductive: Markedly enlarged prostate. Prostate dimensions 9.2 x 8.7 x 6.7 cm (volume = 280 cm^3). Other: No pneumoperitoneum, ascites or focal fluid collection. Multiple (at least 3) small fat containing supraumbilical ventral abdominal hernias near the midline. Musculoskeletal: No aggressive appearing focal osseous lesions. Moderate thoracolumbar spondylosis. IMPRESSION: 1. No abdominopelvic lymphadenopathy. 2. No evidence of bowel obstruction or acute bowel inflammation. Mild distal colonic diverticulosis. 3. Bilateral renal atrophy, asymmetrically prominent on the right. No overt hydronephrosis. 4. Chronic diffuse bladder wall  thickening. Bladder collapsed by indwelling Foley catheter. Markedly enlarged prostate. 5. Cardiomegaly.  Trace dependent bilateral pleural effusions. 6. Multiple small fat containing supraumbilical ventral abdominal hernias near the midline. Electronically Signed   By: Ilona Sorrel M.D.   On: 10/25/2016 17:16   Dg Chest 2 View  Result Date: 10/24/2016 CLINICAL DATA:  Chest pain, shortness of breath EXAM: CHEST  2 VIEW COMPARISON:  05/27/2015 FINDINGS: Lungs are clear.  No pleural effusion or pneumothorax. Cardiomegaly. Visualized osseous structures are within normal limits. IMPRESSION: No evidence of acute cardiopulmonary disease. Electronically Signed   By: Julian Hy M.D.   On: 10/24/2016 11:37   Dg Knee 1-2 Views Left  Result Date: 10/25/2016 CLINICAL DATA:  She swelling, bilateral knee pain, left greater than right EXAM: LEFT KNEE - 1-2 VIEW COMPARISON:  None. FINDINGS: Mild to moderate degenerative changes in all 3 compartments, most pronounced in the patellofemoral compartment. Large joint effusion. No acute bony abnormality. IMPRESSION: Mild to moderate tricompartment degenerative changes. Large joint effusion. No acute bony abnormality. Electronically Signed   By: Rolm Baptise M.D.   On: 10/25/2016 10:46   Dg Knee 1-2 Views Right  Result Date: 10/25/2016 CLINICAL DATA:  Swelling of the bilateral knees, left more than right. EXAM: RIGHT KNEE - 1-2 VIEW COMPARISON:  12/04/2007 and 10/12/2016 FINDINGS: Tricompartmental with moderate marginal spurring and generalized joint narrowing. Degenerative changes have progressed from 2009. Small if any joint effusion. No fracture deformity, erosion, or malalignment. IMPRESSION: Moderate tricompartmental osteoarthritis. No acute superimposed finding. Electronically Signed   By: Neva Seat.D.  On: 10/25/2016 10:47   Nm Pulmonary Perf And Vent  Result Date: 10/24/2016 CLINICAL DATA:  New onset leg edema, which has gotten worse. Some  centralized chest pain. Dizziness with a near syncopal episode. EXAM: NUCLEAR MEDICINE VENTILATION - PERFUSION LUNG SCAN TECHNIQUE: Ventilation images were obtained in multiple projections using inhaled aerosol Tc-66m DTPA. Perfusion images were obtained in multiple projections after intravenous injection of Tc-62m MAA. RADIOPHARMACEUTICALS:  32.5 mCi Technetium-14m DTPA aerosol inhalation and 4.09 mCi Technetium-80m MAA IV COMPARISON:  Current chest radiograph FINDINGS: Ventilation: There are small patchy areas of relative decreased ventilation in the lungs. Ventilation otherwise unremarkable. Perfusion: There are small nonsegmental patchy areas of relative decreased profusion in the lungs which matches the areas of relative decreased ventilation. There are no segmental perfusion defects, mismatched to ventilation, to suggest pulmonary embolism. IMPRESSION: Low probability ventilation-perfusion study for pulmonary thromboembolism. Electronically Signed   By: Lajean Manes M.D.   On: 10/24/2016 13:42   US Soft Tissue Neck  Result Date: 10/24/2016 CLINICAL DATA:  Goiter. Palpable neck mass felt by the patient for 15 years. EXAM: ULTRASOUND OF HEAD/NECK SOFT TISSUES TECHNIQUE: Ultrasound examination of the head and neck soft tissues was performed in the area of clinical concern. COMPARISON:  None. FINDINGS: The palpable abnormality corresponds to the right parotid gland. No parotid gland mass. There are no adjacent masses or enlarged lymph nodes. A single normal size normal morphology node lies in close proximity, measuring 11 mm in long axis and 3.5 mm in short axis. IMPRESSION: 1. The palpable abnormality appears to correspond to the right parotid gland, which is normal appearance on ultrasound. No parotid mass. No enlarged or abnormal lymph nodes. Electronically Signed   By: Lajean Manes M.D.   On: 10/24/2016 14:18     Labs:   Basic Metabolic Panel:  Recent Labs Lab 10/20/16 1555 10/24/16 0925  10/25/16 0916 10/26/16 0204  NA 139 133* 138 135  K 3.8 3.6 3.7 4.6  CL 99 100* 101 99*  CO2 34* 25 28 26   GLUCOSE 86 135* 121* 164*  BUN 39* 33* 31* 35*  CREATININE 2.44* 2.69* 2.37* 2.56*  CALCIUM 8.9 8.2* 8.4* 8.6*   GFR Estimated Creatinine Clearance: 37.8 mL/min (A) (by C-G formula based on SCr of 2.56 mg/dL (H)). Liver Function Tests:  Recent Labs Lab 10/20/16 1555 10/24/16 0925  AST 13 16  ALT 15 12*  ALKPHOS 90 64  BILITOT 0.6 1.4*  PROT 7.0 6.6  ALBUMIN 3.9 3.1*   No results for input(s): LIPASE, AMYLASE in the last 168 hours. No results for input(s): AMMONIA in the last 168 hours. Coagulation profile No results for input(s): INR, PROTIME in the last 168 hours.  CBC:  Recent Labs Lab 10/20/16 1555 10/24/16 0925 10/25/16 0916 10/26/16 0204  WBC 9.0 13.2* 9.9 12.3*  NEUTROABS 4.1 8.8*  --   --   HGB 13.1 12.0* 12.3* 12.6*  HCT 40.4 37.9* 39.1 39.6  MCV 86.2 87.7 87.7 87.4  PLT 316.0 232 252 238   Cardiac Enzymes:  Recent Labs Lab 10/24/16 1622 10/24/16 1848 10/24/16 2134  TROPONINI <0.03 <0.03 <0.03   BNP: Invalid input(s): POCBNP CBG:  Recent Labs Lab 10/24/16 2050  GLUCAP 138*   D-Dimer  Recent Labs  10/24/16 0925  DDIMER 3.27*   Hgb A1c No results for input(s): HGBA1C in the last 72 hours. Lipid Profile No results for input(s): CHOL, HDL, LDLCALC, TRIG, CHOLHDL, LDLDIRECT in the last 72 hours. Thyroid function studies No results  for input(s): TSH, T4TOTAL, T3FREE, THYROIDAB in the last 72 hours.  Invalid input(s): FREET3 Anemia work up No results for input(s): VITAMINB12, FOLATE, FERRITIN, TIBC, IRON, RETICCTPCT in the last 72 hours. Microbiology Recent Results (from the past 240 hour(s))  Body fluid culture     Status: None (Preliminary result)   Collection Time: 10/25/16  2:57 PM  Result Value Ref Range Status   Specimen Description SYNOVIAL RIGHT KNEE  Final   Special Requests NONE  Final   Gram Stain   Final     ABUNDANT WBC PRESENT, PREDOMINANTLY PMN NO ORGANISMS SEEN    Culture NO GROWTH < 24 HOURS  Final   Report Status PENDING  Incomplete  Body fluid culture     Status: None (Preliminary result)   Collection Time: 10/25/16  2:57 PM  Result Value Ref Range Status   Specimen Description SYNOVIAL LEFT KNEE  Final   Special Requests NONE  Final   Gram Stain   Final    ABUNDANT WBC PRESENT, PREDOMINANTLY PMN NO ORGANISMS SEEN    Culture NO GROWTH < 24 HOURS  Final   Report Status PENDING  Incomplete     Discharge Instructions:   Discharge Instructions    Diet - low sodium heart healthy    Complete by:  As directed    Diet Carb Modified    Complete by:  As directed    Discharge instructions    Complete by:  As directed    Follow a diabetic diet while on steroids   Increase activity slowly    Complete by:  As directed      Allergies as of 10/26/2016   No Known Allergies     Medication List    STOP taking these medications   labetalol 200 MG tablet Commonly known as:  NORMODYNE     TAKE these medications   acetaminophen 325 MG tablet Commonly known as:  TYLENOL Take 2 tablets (650 mg total) by mouth every 6 (six) hours as needed. What changed:  reasons to take this   Acetaminophen-Codeine 300-30 MG tablet Commonly known as:  TYLENOL/CODEINE #3 Take 0.5-1 tablets by mouth every 6 (six) hours as needed for pain.   allopurinol 100 MG tablet Commonly known as:  ZYLOPRIM Take 1 tablet (100 mg total) by mouth daily.   amLODipine 5 MG tablet Commonly known as:  NORVASC Take 1 tablet (5 mg total) by mouth daily.   aspirin 81 MG tablet Take 81 mg by mouth daily.   atorvastatin 40 MG tablet Commonly known as:  LIPITOR Take 1 tablet (40 mg total) by mouth daily.   metoprolol tartrate 25 MG tablet Commonly known as:  LOPRESSOR Take 1 tablet (25 mg total) by mouth 2 (two) times daily.   oxybutynin 5 MG tablet Commonly known as:  DITROPAN Take 1 tablet (5 mg total) by  mouth daily. What changed:  when to take this  reasons to take this   predniSONE 20 MG tablet Commonly known as:  DELTASONE 60 mg x 7 days then 40 mg x 2 days then 20 mg x 2 days then 10 mg x 2 days then stop      Follow-up Information    Biagio Borg, MD Follow up in 1 week(s).   Specialties:  Internal Medicine, Radiology Contact information: [place] [address] [phone]        Meredith Pel, MD Follow up.   Specialty:  Orthopedic Surgery Why:  as needed Contact information: [place] [address] [phone]            Time coordinating discharge: 25 min  Signed:  Linda Grimmer Alison Stalling   [facility] 10/26/2016, 11:51 AM

## 2016-10-26 NOTE — Progress Notes (Signed)
Knees aspirated Will check today

## 2016-10-28 LAB — BODY FLUID CULTURE
CULTURE: NO GROWTH
Culture: NO GROWTH

## 2016-10-29 DIAGNOSIS — R600 Localized edema: Secondary | ICD-10-CM | POA: Diagnosis not present

## 2016-10-29 DIAGNOSIS — Z466 Encounter for fitting and adjustment of urinary device: Secondary | ICD-10-CM | POA: Diagnosis not present

## 2016-10-29 DIAGNOSIS — Z87891 Personal history of nicotine dependence: Secondary | ICD-10-CM | POA: Diagnosis not present

## 2016-10-29 DIAGNOSIS — K5790 Diverticulosis of intestine, part unspecified, without perforation or abscess without bleeding: Secondary | ICD-10-CM | POA: Diagnosis not present

## 2016-10-29 DIAGNOSIS — E785 Hyperlipidemia, unspecified: Secondary | ICD-10-CM | POA: Diagnosis not present

## 2016-10-29 DIAGNOSIS — M109 Gout, unspecified: Secondary | ICD-10-CM | POA: Diagnosis not present

## 2016-10-29 DIAGNOSIS — N183 Chronic kidney disease, stage 3 (moderate): Secondary | ICD-10-CM | POA: Diagnosis not present

## 2016-10-29 DIAGNOSIS — K648 Other hemorrhoids: Secondary | ICD-10-CM | POA: Diagnosis not present

## 2016-10-29 DIAGNOSIS — D649 Anemia, unspecified: Secondary | ICD-10-CM | POA: Diagnosis not present

## 2016-10-29 DIAGNOSIS — R339 Retention of urine, unspecified: Secondary | ICD-10-CM | POA: Diagnosis not present

## 2016-10-29 DIAGNOSIS — M150 Primary generalized (osteo)arthritis: Secondary | ICD-10-CM | POA: Diagnosis not present

## 2016-10-29 DIAGNOSIS — I129 Hypertensive chronic kidney disease with stage 1 through stage 4 chronic kidney disease, or unspecified chronic kidney disease: Secondary | ICD-10-CM | POA: Diagnosis not present

## 2016-10-29 DIAGNOSIS — N401 Enlarged prostate with lower urinary tract symptoms: Secondary | ICD-10-CM | POA: Diagnosis not present

## 2016-10-29 DIAGNOSIS — N139 Obstructive and reflux uropathy, unspecified: Secondary | ICD-10-CM | POA: Diagnosis not present

## 2016-10-30 LAB — CULTURE, BLOOD (ROUTINE X 2)
CULTURE: NO GROWTH
Culture: NO GROWTH
Special Requests: ADEQUATE
Special Requests: ADEQUATE

## 2016-11-01 DIAGNOSIS — E785 Hyperlipidemia, unspecified: Secondary | ICD-10-CM | POA: Diagnosis not present

## 2016-11-01 DIAGNOSIS — Z466 Encounter for fitting and adjustment of urinary device: Secondary | ICD-10-CM | POA: Diagnosis not present

## 2016-11-01 DIAGNOSIS — Z87891 Personal history of nicotine dependence: Secondary | ICD-10-CM | POA: Diagnosis not present

## 2016-11-01 DIAGNOSIS — K648 Other hemorrhoids: Secondary | ICD-10-CM | POA: Diagnosis not present

## 2016-11-01 DIAGNOSIS — I129 Hypertensive chronic kidney disease with stage 1 through stage 4 chronic kidney disease, or unspecified chronic kidney disease: Secondary | ICD-10-CM | POA: Diagnosis not present

## 2016-11-01 DIAGNOSIS — N401 Enlarged prostate with lower urinary tract symptoms: Secondary | ICD-10-CM | POA: Diagnosis not present

## 2016-11-01 DIAGNOSIS — R600 Localized edema: Secondary | ICD-10-CM | POA: Diagnosis not present

## 2016-11-01 DIAGNOSIS — N183 Chronic kidney disease, stage 3 (moderate): Secondary | ICD-10-CM | POA: Diagnosis not present

## 2016-11-01 DIAGNOSIS — M109 Gout, unspecified: Secondary | ICD-10-CM | POA: Diagnosis not present

## 2016-11-01 DIAGNOSIS — R339 Retention of urine, unspecified: Secondary | ICD-10-CM | POA: Diagnosis not present

## 2016-11-01 DIAGNOSIS — K5790 Diverticulosis of intestine, part unspecified, without perforation or abscess without bleeding: Secondary | ICD-10-CM | POA: Diagnosis not present

## 2016-11-01 DIAGNOSIS — D649 Anemia, unspecified: Secondary | ICD-10-CM | POA: Diagnosis not present

## 2016-11-01 DIAGNOSIS — M150 Primary generalized (osteo)arthritis: Secondary | ICD-10-CM | POA: Diagnosis not present

## 2016-11-01 DIAGNOSIS — N139 Obstructive and reflux uropathy, unspecified: Secondary | ICD-10-CM | POA: Diagnosis not present

## 2016-11-09 DIAGNOSIS — I129 Hypertensive chronic kidney disease with stage 1 through stage 4 chronic kidney disease, or unspecified chronic kidney disease: Secondary | ICD-10-CM | POA: Diagnosis not present

## 2016-11-09 DIAGNOSIS — K5790 Diverticulosis of intestine, part unspecified, without perforation or abscess without bleeding: Secondary | ICD-10-CM | POA: Diagnosis not present

## 2016-11-09 DIAGNOSIS — Z466 Encounter for fitting and adjustment of urinary device: Secondary | ICD-10-CM | POA: Diagnosis not present

## 2016-11-09 DIAGNOSIS — N183 Chronic kidney disease, stage 3 (moderate): Secondary | ICD-10-CM | POA: Diagnosis not present

## 2016-11-09 DIAGNOSIS — M150 Primary generalized (osteo)arthritis: Secondary | ICD-10-CM | POA: Diagnosis not present

## 2016-11-09 DIAGNOSIS — M109 Gout, unspecified: Secondary | ICD-10-CM | POA: Diagnosis not present

## 2016-11-09 DIAGNOSIS — E785 Hyperlipidemia, unspecified: Secondary | ICD-10-CM | POA: Diagnosis not present

## 2016-11-09 DIAGNOSIS — N401 Enlarged prostate with lower urinary tract symptoms: Secondary | ICD-10-CM | POA: Diagnosis not present

## 2016-11-09 DIAGNOSIS — R339 Retention of urine, unspecified: Secondary | ICD-10-CM | POA: Diagnosis not present

## 2016-11-09 DIAGNOSIS — R600 Localized edema: Secondary | ICD-10-CM | POA: Diagnosis not present

## 2016-11-09 DIAGNOSIS — N139 Obstructive and reflux uropathy, unspecified: Secondary | ICD-10-CM | POA: Diagnosis not present

## 2016-11-09 DIAGNOSIS — D649 Anemia, unspecified: Secondary | ICD-10-CM | POA: Diagnosis not present

## 2016-11-10 ENCOUNTER — Encounter (HOSPITAL_COMMUNITY): Payer: Self-pay | Admitting: Emergency Medicine

## 2016-11-10 ENCOUNTER — Emergency Department (HOSPITAL_COMMUNITY)
Admission: EM | Admit: 2016-11-10 | Discharge: 2016-11-10 | Disposition: A | Discharge status: Home / Self Care | Payer: Medicare Other | Attending: Emergency Medicine | Admitting: Emergency Medicine

## 2016-11-10 DIAGNOSIS — I129 Hypertensive chronic kidney disease with stage 1 through stage 4 chronic kidney disease, or unspecified chronic kidney disease: Secondary | ICD-10-CM | POA: Diagnosis not present

## 2016-11-10 DIAGNOSIS — R52 Pain, unspecified: Secondary | ICD-10-CM | POA: Diagnosis not present

## 2016-11-10 DIAGNOSIS — Z87891 Personal history of nicotine dependence: Secondary | ICD-10-CM | POA: Insufficient documentation

## 2016-11-10 DIAGNOSIS — M10061 Idiopathic gout, right knee: Secondary | ICD-10-CM | POA: Insufficient documentation

## 2016-11-10 DIAGNOSIS — N183 Chronic kidney disease, stage 3 (moderate): Secondary | ICD-10-CM | POA: Diagnosis not present

## 2016-11-10 DIAGNOSIS — M109 Gout, unspecified: Secondary | ICD-10-CM

## 2016-11-10 DIAGNOSIS — Z79899 Other long term (current) drug therapy: Secondary | ICD-10-CM | POA: Diagnosis not present

## 2016-11-10 DIAGNOSIS — M79606 Pain in leg, unspecified: Secondary | ICD-10-CM | POA: Diagnosis not present

## 2016-11-10 DIAGNOSIS — M79604 Pain in right leg: Secondary | ICD-10-CM | POA: Diagnosis present

## 2016-11-10 LAB — CBC WITH DIFFERENTIAL/PLATELET
Basophils Absolute: 0 10*3/uL (ref 0.0–0.1)
Basophils Relative: 0 %
Eosinophils Absolute: 0 10*3/uL (ref 0.0–0.7)
Eosinophils Relative: 0 %
HCT: 42.4 % (ref 39.0–52.0)
HEMOGLOBIN: 14 g/dL (ref 13.0–17.0)
Lymphocytes Relative: 17 %
Lymphs Abs: 1.9 10*3/uL (ref 0.7–4.0)
MCH: 28.6 pg (ref 26.0–34.0)
MCHC: 33 g/dL (ref 30.0–36.0)
MCV: 86.7 fL (ref 78.0–100.0)
MONOS PCT: 17 %
Monocytes Absolute: 1.9 10*3/uL — ABNORMAL HIGH (ref 0.1–1.0)
NEUTROS PCT: 66 %
Neutro Abs: 7.5 10*3/uL (ref 1.7–7.7)
Platelets: 274 10*3/uL (ref 150–400)
RBC: 4.89 MIL/uL (ref 4.22–5.81)
RDW: 15.3 % (ref 11.5–15.5)
WBC: 11.3 10*3/uL — ABNORMAL HIGH (ref 4.0–10.5)

## 2016-11-10 LAB — COMPREHENSIVE METABOLIC PANEL
ALBUMIN: 3.2 g/dL — AB (ref 3.5–5.0)
ALT: 17 U/L (ref 17–63)
AST: 16 U/L (ref 15–41)
Alkaline Phosphatase: 74 U/L (ref 38–126)
Anion gap: 8 (ref 5–15)
BUN: 31 mg/dL — AB (ref 6–20)
CO2: 33 mmol/L — ABNORMAL HIGH (ref 22–32)
Calcium: 8.6 mg/dL — ABNORMAL LOW (ref 8.9–10.3)
Chloride: 99 mmol/L — ABNORMAL LOW (ref 101–111)
Creatinine, Ser: 2.36 mg/dL — ABNORMAL HIGH (ref 0.61–1.24)
GFR calc Af Amer: 31 mL/min — ABNORMAL LOW (ref >60)
GFR calc non Af Amer: 27 mL/min — ABNORMAL LOW (ref >60)
GLUCOSE: 123 mg/dL — AB (ref 65–99)
POTASSIUM: 3.4 mmol/L — AB (ref 3.5–5.1)
Sodium: 140 mmol/L (ref 135–145)
TOTAL PROTEIN: 6.7 g/dL (ref 6.5–8.1)
Total Bilirubin: 1.1 mg/dL (ref 0.3–1.2)

## 2016-11-10 MED ORDER — PREDNISONE 20 MG PO TABS
60.0000 mg | ORAL_TABLET | Freq: Every day | ORAL | Status: DC
  Administered 2016-11-10: 60 mg via ORAL
  Filled 2016-11-10: qty 3

## 2016-11-10 MED ORDER — PREDNISONE 10 MG PO TABS: ORAL_TABLET | ORAL | 0 refills | Status: DC

## 2016-11-10 MED ORDER — OXYCODONE-ACETAMINOPHEN 5-325 MG PO TABS: 2.0000 | ORAL_TABLET | ORAL | 0 refills | Status: DC | PRN

## 2016-11-10 MED ORDER — OXYCODONE-ACETAMINOPHEN 5-325 MG PO TABS
2.0000 | ORAL_TABLET | Freq: Once | ORAL | Status: AC
  Administered 2016-11-10: 2 via ORAL
  Filled 2016-11-10: qty 2

## 2016-11-10 MED ORDER — OXYCODONE-ACETAMINOPHEN 5-325 MG PO TABS: 2.0000 | ORAL_TABLET | Freq: Once | ORAL | Status: DC

## 2016-11-10 NOTE — Discharge Instructions (Signed)
Return if any problems.

## 2016-11-10 NOTE — ED Triage Notes (Signed)
Pt brought in by EMS with c/o bilateral leg pain  Pt was in the hospital in May and discharged on the 22nd with diagnoses of chest pain  Pt states he completed his prednisone on Tuesday and then started to have leg pain and swelling to his feet

## 2016-11-10 NOTE — ED Notes (Signed)
Bed: GC62 Expected date:  Expected time:  Means of arrival:  Comments: No staff

## 2016-11-10 NOTE — ED Provider Notes (Signed)
[facility] DEPT Provider Note   CSN: [id number] Arrival date & time: 11/10/16  0442     History   Chief Complaint Chief Complaint  Patient presents with  . Leg Pain    HPI [name] is a 66 y.o. male.  The history is provided by the patient. No language interpreter was used.  Leg Pain   This is a new problem. The current episode started more than 1 week ago. The problem occurs constantly. The problem has been gradually worsening. The pain is present in the right lower leg. The quality of the pain is described as aching. The pain is severe. Pertinent negatives include no numbness. He has tried nothing for the symptoms. The treatment provided no relief.  Pt was recently hospitalized for chest pain and gout to his leg.  Pt reports no chest complaints but he is having continued leg pain.  Pt reports he improved on prednisone but pain returned when he stopped.   Past Medical History:  Diagnosis Date  . Anemia    normal Fe, nl B12, nl retic, nl EPO July '13  . Blood transfusion without reported diagnosis   . BPH (benign prostatic hyperplasia)   . Chronic back pain   . Chronic kidney disease    CKD III, obstructive nephropathy  . Diverticulosis   . Dysuria   . Elevated PSA, greater than or equal to 20 ng/ml June '13   PSA 107  . Hemorrhoids, internal, with bleeding, prolapse 09/19/2014  . Hyperlipidemia 05/29/2014  . Hypertension   . Obstructive uropathy 11/27/2015  . Renal insufficiency   . Tuberculosis    h/o PPD +  . UTI (urinary tract infection)     Patient Active Problem List   Diagnosis Date Noted  . Urinary retention due to benign prostatic hyperplasia 10/24/2016  . Mass of right side of neck 10/20/2016  . Gout 10/20/2016  . Knee pain, acute 10/12/2016  . Hypokalemia 05/28/2016  . Encounter for well adult exam with abnormal findings 11/27/2015  . Obstructive uropathy 11/27/2015  . Elevated PSA 11/27/2015  . Acute pyelonephritis 05/27/2015  . Generalized  bloating 05/27/2015  . Constipation 05/27/2015  . Chest pain 05/27/2015  . AKI (acute kidney injury) ([place]) 05/27/2015  . Acute sinus infection 11/22/2014  . Cough 09/25/2014  . Hemorrhoids, internal, with bleeding, prolapse 09/19/2014  . 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 Hyperlipidemia 05/29/2014  . Lumbar radiculopathy 05/23/2014  . Lumbar stenosis 11/20/2013  . Abnormal glucose 11/06/2013  . Unspecified hereditary and idiopathic peripheral neuropathy 01/22/2013  . 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 CKD (chronic kidney disease) stage 3, GFR 30-59 ml/min 11/24/2011  . Hematuria 11/24/2011  . Hydronephrosis 11/24/2011  . Anemia 11/24/2011  . BRADYCARDIA 02/05/2010  . TINEA PEDIS 01/29/2010  . 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 PERIPHERAL EDEMA 01/29/2010  . ABNORMAL ELECTROCARDIOGRAM 01/29/2010  . 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  . Osteoarthrosis, generalized, 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  . ONYCHOMYCOSIS, TOENAILS 10/02/2007  . BPH (benign prostatic hyperplasia) 10/02/2007  . Essential hypertension 03/06/2007    Past Surgical History:  Procedure Laterality Date  . CARPAL TUNNEL RELEASE Right   . COLONOSCOPY    . HEMORRHOID BANDING    . PROSTATE SURGERY    . SPLENECTOMY         Home Medications    Prior to Admission medications   Medication Sig Start Date End Date Taking? Authorizing Provider  Acetaminophen-Codeine (TYLENOL/CODEINE #3) 300-30 MG tablet Take 0.5-1 tablets by mouth every 6 (  six) hours as needed for pain. 10/12/16  Yes Plotnikov, Evie Lacks, MD  allopurinol (ZYLOPRIM) 100 MG tablet Take 1 tablet (100 mg total) by mouth daily. 10/20/16  Yes Biagio Borg, MD  amLODipine (NORVASC) 5 MG tablet Take 1 tablet (5 mg total) by mouth daily. 10/12/16  Yes Plotnikov, Evie Lacks, MD  aspirin 81 MG tablet Take 81 mg by mouth daily.   Yes [provider]  atorvastatin (LIPITOR) 40 MG tablet Take 1 tablet (40 mg total) by mouth daily. 10/20/16  Yes  Biagio Borg, MD  finasteride (PROSCAR) 5 MG tablet Take 5 mg by mouth daily.   Yes [provider]  metoprolol tartrate (LOPRESSOR) 25 MG tablet Take 1 tablet (25 mg total) by mouth 2 (two) times daily. 10/26/16  Yes Eulogio Bear U, DO  oxybutynin (DITROPAN) 5 MG tablet Take 1 tablet (5 mg total) by mouth daily. Patient taking differently: Take 5 mg by mouth daily as needed for bladder spasms.  05/29/14  Yes Biagio Borg, MD  tamsulosin (FLOMAX) 0.4 MG CAPS capsule Take 1 capsule by mouth 2 (two) times daily. 10/22/16  Yes [provider]  acetaminophen (TYLENOL) 325 MG tablet Take 2 tablets (650 mg total) by mouth every 6 (six) hours as needed. Patient not taking: Reported on 11/10/2016 05/23/15   Varney Biles, MD  predniSONE (DELTASONE) 20 MG tablet 60 mg x 7 days then 40 mg x 2 days then 20 mg x 2 days then 10 mg x 2 days then stop Patient not taking: Reported on 11/10/2016 10/26/16   Geradine Girt, DO    Family History Family History  Problem Relation Age of Onset  . Diabetes Mother   . Stroke Brother   . Hearing loss Brother   . Colon cancer Neg Hx   . Rectal cancer Neg Hx   . Stomach cancer Neg Hx   . Esophageal cancer Neg Hx     Social History Social History  Substance Use Topics  . Smoking status: Former Smoker    Types: Cigarettes  . Smokeless tobacco: Never Used     Comment: Quit 1981  . Alcohol use No     Comment: Quit 2004     Allergies   Patient has no known allergies.   Review of Systems Review of Systems  Neurological: Negative for numbness.  All other systems reviewed and are negative.    Physical Exam Updated Vital Signs BP 130/77 (BP Location: Right Arm)   Pulse 96   Temp 98.3 F (36.8 C) (Oral)   Resp 18   SpO2 97%   Physical Exam  Constitutional: He appears well-developed and well-nourished.  HENT:  Head: Normocephalic and atraumatic.  Eyes: Conjunctivae are normal.  Neck: Neck supple.  Cardiovascular: Normal rate  and regular rhythm.   No murmur heard. Pulmonary/Chest: Effort normal and breath sounds normal. No respiratory distress.  Abdominal: Soft. There is no tenderness.  Musculoskeletal: He exhibits edema and tenderness.  Tender right knee and right leg.   Neurological: He is alert.  Skin: Skin is warm and dry.  Psychiatric: He has a normal mood and affect.  Nursing note and vitals reviewed.    ED Treatments / Results  Labs (all labs ordered are listed, but only abnormal results are displayed) Labs Reviewed  CBC WITH DIFFERENTIAL/PLATELET - Abnormal; Notable for the following:       Result Value   WBC 11.3 (*)    Monocytes Absolute 1.9 (*)  All other components within normal limits  COMPREHENSIVE METABOLIC PANEL - Abnormal; Notable for the following:    Potassium 3.4 (*)    Chloride 99 (*)    CO2 33 (*)    Glucose, Bld 123 (*)    BUN 31 (*)    Creatinine, Ser 2.36 (*)    Calcium 8.6 (*)    Albumin 3.2 (*)    GFR calc non Af Amer 27 (*)    GFR calc Af Amer 31 (*)    All other components within normal limits    EKG  EKG Interpretation None       Radiology No results found.  Procedures Procedures (including critical care time)  Medications Ordered in ED Medications  predniSONE (DELTASONE) tablet 60 mg (60 mg Oral Given 11/10/16 0801)  oxyCODONE-acetaminophen (PERCOCET/ROXICET) 5-325 MG per tablet 2 tablet (2 tablets Oral Given 11/10/16 4917)     Initial Impression / Assessment and Plan / ED Course  I have reviewed the triage vital signs and the nursing notes.  Pertinent labs & imaging results that were available during my care of the patient were reviewed by me and considered in my medical decision making (see chart for details).  Clinical Course as of Nov 11 818  Wed Nov 10, 2016  0730 Potassium: (!) 3.4 [LS]    Clinical Course User Index [LS] Fransico Meadow, [place]      Final Clinical Impressions(s) / ED Diagnoses   Final diagnoses:  Acute gout of  right knee, unspecified cause    New Prescriptions New Prescriptions   OXYCODONE-ACETAMINOPHEN (PERCOCET/ROXICET) 5-325 MG TABLET    Take 2 tablets by mouth every 4 (four) hours as needed for severe pain.   PREDNISONE (DELTASONE) 10 MG TABLET    6,5,4,3,2,1 taper     Fransico Meadow, PA-C 11/10/16 9150    Dorie Rank, MD 11/10/16 [phone]

## 2016-11-11 ENCOUNTER — Telehealth: Payer: Self-pay | Admitting: Internal Medicine

## 2016-11-11 DIAGNOSIS — E785 Hyperlipidemia, unspecified: Secondary | ICD-10-CM | POA: Diagnosis not present

## 2016-11-11 DIAGNOSIS — K648 Other hemorrhoids: Secondary | ICD-10-CM | POA: Diagnosis not present

## 2016-11-11 DIAGNOSIS — Z466 Encounter for fitting and adjustment of urinary device: Secondary | ICD-10-CM | POA: Diagnosis not present

## 2016-11-11 DIAGNOSIS — N401 Enlarged prostate with lower urinary tract symptoms: Secondary | ICD-10-CM | POA: Diagnosis not present

## 2016-11-11 DIAGNOSIS — N183 Chronic kidney disease, stage 3 (moderate): Secondary | ICD-10-CM | POA: Diagnosis not present

## 2016-11-11 DIAGNOSIS — M150 Primary generalized (osteo)arthritis: Secondary | ICD-10-CM | POA: Diagnosis not present

## 2016-11-11 DIAGNOSIS — K5790 Diverticulosis of intestine, part unspecified, without perforation or abscess without bleeding: Secondary | ICD-10-CM | POA: Diagnosis not present

## 2016-11-11 DIAGNOSIS — R600 Localized edema: Secondary | ICD-10-CM | POA: Diagnosis not present

## 2016-11-11 DIAGNOSIS — I129 Hypertensive chronic kidney disease with stage 1 through stage 4 chronic kidney disease, or unspecified chronic kidney disease: Secondary | ICD-10-CM | POA: Diagnosis not present

## 2016-11-11 DIAGNOSIS — M109 Gout, unspecified: Secondary | ICD-10-CM | POA: Diagnosis not present

## 2016-11-11 DIAGNOSIS — Z87891 Personal history of nicotine dependence: Secondary | ICD-10-CM | POA: Diagnosis not present

## 2016-11-11 DIAGNOSIS — N139 Obstructive and reflux uropathy, unspecified: Secondary | ICD-10-CM | POA: Diagnosis not present

## 2016-11-11 DIAGNOSIS — D649 Anemia, unspecified: Secondary | ICD-10-CM | POA: Diagnosis not present

## 2016-11-11 DIAGNOSIS — R339 Retention of urine, unspecified: Secondary | ICD-10-CM | POA: Diagnosis not present

## 2016-11-11 NOTE — Telephone Encounter (Signed)
Nees verbals for skilled nursing evaluation  for med management and gout control 1x1

## 2016-11-11 NOTE — Telephone Encounter (Signed)
Ok for verbals 

## 2016-11-11 NOTE — Telephone Encounter (Signed)
Called [name], left VM with ok for verbal orders.

## 2016-11-15 DIAGNOSIS — Z466 Encounter for fitting and adjustment of urinary device: Secondary | ICD-10-CM | POA: Diagnosis not present

## 2016-11-15 DIAGNOSIS — M109 Gout, unspecified: Secondary | ICD-10-CM | POA: Diagnosis not present

## 2016-11-15 DIAGNOSIS — K5790 Diverticulosis of intestine, part unspecified, without perforation or abscess without bleeding: Secondary | ICD-10-CM | POA: Diagnosis not present

## 2016-11-15 DIAGNOSIS — E785 Hyperlipidemia, unspecified: Secondary | ICD-10-CM | POA: Diagnosis not present

## 2016-11-15 DIAGNOSIS — K648 Other hemorrhoids: Secondary | ICD-10-CM | POA: Diagnosis not present

## 2016-11-15 DIAGNOSIS — D649 Anemia, unspecified: Secondary | ICD-10-CM | POA: Diagnosis not present

## 2016-11-15 DIAGNOSIS — R600 Localized edema: Secondary | ICD-10-CM | POA: Diagnosis not present

## 2016-11-15 DIAGNOSIS — I129 Hypertensive chronic kidney disease with stage 1 through stage 4 chronic kidney disease, or unspecified chronic kidney disease: Secondary | ICD-10-CM | POA: Diagnosis not present

## 2016-11-15 DIAGNOSIS — M150 Primary generalized (osteo)arthritis: Secondary | ICD-10-CM | POA: Diagnosis not present

## 2016-11-15 DIAGNOSIS — N139 Obstructive and reflux uropathy, unspecified: Secondary | ICD-10-CM | POA: Diagnosis not present

## 2016-11-15 DIAGNOSIS — N183 Chronic kidney disease, stage 3 (moderate): Secondary | ICD-10-CM | POA: Diagnosis not present

## 2016-11-15 DIAGNOSIS — Z87891 Personal history of nicotine dependence: Secondary | ICD-10-CM | POA: Diagnosis not present

## 2016-11-15 DIAGNOSIS — R339 Retention of urine, unspecified: Secondary | ICD-10-CM | POA: Diagnosis not present

## 2016-11-15 DIAGNOSIS — N401 Enlarged prostate with lower urinary tract symptoms: Secondary | ICD-10-CM | POA: Diagnosis not present

## 2016-11-18 ENCOUNTER — Ambulatory Visit (INDEPENDENT_AMBULATORY_CARE_PROVIDER_SITE_OTHER): Payer: Medicare Other | Admitting: Family Medicine

## 2016-11-18 ENCOUNTER — Encounter: Payer: Self-pay | Admitting: Family Medicine

## 2016-11-18 ENCOUNTER — Ambulatory Visit: Payer: Self-pay

## 2016-11-18 VITALS — BP 150/80 | HR 68 | Ht 73.0 in | Wt 257.0 lb

## 2016-11-18 DIAGNOSIS — M170 Bilateral primary osteoarthritis of knee: Secondary | ICD-10-CM

## 2016-11-18 DIAGNOSIS — G8929 Other chronic pain: Secondary | ICD-10-CM

## 2016-11-18 DIAGNOSIS — M25562 Pain in left knee: Secondary | ICD-10-CM

## 2016-11-18 DIAGNOSIS — M25561 Pain in right knee: Secondary | ICD-10-CM | POA: Diagnosis not present

## 2016-11-18 NOTE — Progress Notes (Signed)
[name] [address], [place] [address] Phone: [phone] Subjective:    I'm seeing this patient by the request  of:  Biagio Borg, MD   CC: Knee pain  [id number]  [name] is a 66 y.o. male coming in with complaint of knee pain. Past medical history significant gout. Patient recently had severe swelling of the right knee pain. Had to go to the emergency department. Was given prednisone as well as oxycodone. This was one week ago. Patient states  Both knees are hurting him at this time. Also having some ankle pain. States that it is just chronic. Describes the pain as a dull, throbbing aching pain. Patient states sometimes is associated with worsening swelling. Walks with the aid of a cane. Does not take anti-inflammatories and patient's kidney disease.    Past Medical History:  Diagnosis Date  . Anemia    normal Fe, nl B12, nl retic, nl EPO July '13  . Blood transfusion without reported diagnosis   . BPH (benign prostatic hyperplasia)   . Chronic back pain   . Chronic kidney disease    CKD III, obstructive nephropathy  . Diverticulosis   . Dysuria   . Elevated PSA, greater than or equal to 20 ng/ml June '13   PSA 107  . Hemorrhoids, internal, with bleeding, prolapse 09/19/2014  . Hyperlipidemia 05/29/2014  . Hypertension   . Obstructive uropathy 11/27/2015  . Renal insufficiency   . Tuberculosis    h/o PPD +  . UTI (urinary tract infection)    Past Surgical History:  Procedure Laterality Date  . CARPAL TUNNEL RELEASE Right   . COLONOSCOPY    . HEMORRHOID BANDING    . PROSTATE SURGERY    . SPLENECTOMY     Social History   Social History  . Marital status: Married    Spouse name: N/A  . Number of children: 2  . Years of education: 20   Occupational History  . [employer]    unemployed   Social History Main Topics  . Smoking status: Former Smoker    Types: Cigarettes  . Smokeless tobacco:  Never Used     Comment: Quit 1981  . Alcohol use No     Comment: Quit 2004  . Drug use: No  . Sexual activity: No   Other Topics Concern  . None   Social History Narrative   Native of [place], raised poor farming community. . To [place] '78 - finished College, all but Arts administrator. Married - wife in [place], blocked from [place] to [place]. 1 son '90; 1 dtr '93. 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 Resources depleted.   Right handed.   Caffeine Hot daily one daily.   No Known Allergies Family History  Problem Relation Age of Onset  . Diabetes Mother   . Stroke Brother   . Hearing loss Brother   . Colon cancer Neg Hx   . Rectal cancer Neg Hx   . Stomach cancer Neg Hx   . Esophageal cancer Neg Hx     Past medical history, social, surgical and family history all reviewed in electronic medical record.  No pertanent information unless stated regarding to the chief complaint.   Review of Systems:Review of systems updated and as accurate as of 11/18/16  No headache, visual changes, nausea, vomiting, diarrhea, constipation, abdominal pain, skin rash, fevers, chills, night sweats, weight loss, swollen lymph nodes,, chest  pain, shortness of breath, mood changes.  Positive joint swelling, muscle aches, body aches mild dizziness  Objective  Blood pressure (!) 150/80, pulse 68, height 6\' 1"  (1.854 m), weight 257 lb (116.6 kg), SpO2 96 %. Systems examined below as of 11/18/16   General: No apparent distress alert and oriented x3 mood and affect normal, dressed appropriately.  HEENT: Pupils equal, extraocular movements intact  Respiratory: Patient's speak in full sentences and does not appear short of breath  Cardiovascular: No lower extremity edema, non tender, no erythema  Skin: Warm dry intact with no signs of infection or rash on extremities or on axial skeleton.  Abdomen: Soft nontender  Neuro: Cranial nerves II through XII are intact, neurovascularly  intact in all extremities with 2+ DTRs and 2+ pulses.  Lymph: No lymphadenopathy of posterior or anterior cervical chain or axillae bilaterally.  Gait Antalgic gait walks with the aid of a cane MSK:  tender with full range of motion and good stability and symmetric strength and tone of shoulders, elbows, wrist, hip, and ankles bilaterally. Severe arthritic changes of multiple joints Knee: Bilateral valgus deformity noted. Large thigh to calf ratio. Trace effusion noted Tender to palpation over medial and PF joint line.  ROM full in flexion and extension and lower leg rotation. instability with valgus force.  painful patellar compression. Patellar glide with severe crepitus. Patellar and quadriceps tendons unremarkable. Hamstring and quadriceps strength is normal.   MSK US performed of: Bilateral This study was ordered, performed, and interpreted by Charlann Boxer D.O.  Knee: Bilateral Limited study shows the patient does have severe bone-on-bone patellofemoral arthritis. Mild pain and narrowing of the lateral joint line. Patient discusses some mild degenerative changes of the meniscus.  IMPRESSION:  Arthritis of the knees bilaterally    Impression and Recommendations:     This case required medical decision making of moderate complexity.      Note: This dictation was prepared with Dragon dictation along with smaller phrase technology. Any transcriptional errors that result from this process are unintentional.

## 2016-11-18 NOTE — Patient Instructions (Addendum)
Good to see you  This will be a little balancing act with your other problems Avoid seafood, alcohol and please stay hydrated.  pennsaid pinkie amount topically 2 times daily as needed.  Ice 20 minutes 2 times daily. Usually after activity and before bed. allopurinol daily  Over the counter tart cherry extract any dose at night Exercises 3 times a week.  See me again in 4 weeks and if not better we will inject the knees.

## 2016-11-18 NOTE — Assessment & Plan Note (Signed)
Degenerative knee arthritis bilaterally. Patient will try conservative therapy with topical anti-inflammatories. We discussed over-the-counter medications a could be safe and help him with some of the underlying gout. We discussed diet changes that could be beneficial. We discussed icing regimen. We discussed proper shoes. Patient will follow-up again in 4 weeks. Worsening symptoms consider injections. Due to the instability as well as the thigh to calf ratio patient will be fitted for custom brace.

## 2016-11-20 DIAGNOSIS — E785 Hyperlipidemia, unspecified: Secondary | ICD-10-CM | POA: Diagnosis not present

## 2016-11-20 DIAGNOSIS — M150 Primary generalized (osteo)arthritis: Secondary | ICD-10-CM | POA: Diagnosis not present

## 2016-11-20 DIAGNOSIS — R339 Retention of urine, unspecified: Secondary | ICD-10-CM | POA: Diagnosis not present

## 2016-11-20 DIAGNOSIS — Z87891 Personal history of nicotine dependence: Secondary | ICD-10-CM | POA: Diagnosis not present

## 2016-11-20 DIAGNOSIS — K648 Other hemorrhoids: Secondary | ICD-10-CM | POA: Diagnosis not present

## 2016-11-20 DIAGNOSIS — N401 Enlarged prostate with lower urinary tract symptoms: Secondary | ICD-10-CM | POA: Diagnosis not present

## 2016-11-20 DIAGNOSIS — M109 Gout, unspecified: Secondary | ICD-10-CM | POA: Diagnosis not present

## 2016-11-20 DIAGNOSIS — R600 Localized edema: Secondary | ICD-10-CM | POA: Diagnosis not present

## 2016-11-20 DIAGNOSIS — I129 Hypertensive chronic kidney disease with stage 1 through stage 4 chronic kidney disease, or unspecified chronic kidney disease: Secondary | ICD-10-CM | POA: Diagnosis not present

## 2016-11-20 DIAGNOSIS — D649 Anemia, unspecified: Secondary | ICD-10-CM | POA: Diagnosis not present

## 2016-11-20 DIAGNOSIS — Z466 Encounter for fitting and adjustment of urinary device: Secondary | ICD-10-CM | POA: Diagnosis not present

## 2016-11-20 DIAGNOSIS — K5790 Diverticulosis of intestine, part unspecified, without perforation or abscess without bleeding: Secondary | ICD-10-CM | POA: Diagnosis not present

## 2016-11-20 DIAGNOSIS — N139 Obstructive and reflux uropathy, unspecified: Secondary | ICD-10-CM | POA: Diagnosis not present

## 2016-11-20 DIAGNOSIS — N183 Chronic kidney disease, stage 3 (moderate): Secondary | ICD-10-CM | POA: Diagnosis not present

## 2016-11-21 ENCOUNTER — Emergency Department (HOSPITAL_COMMUNITY)
Admission: EM | Admit: 2016-11-21 | Discharge: 2016-11-21 | Disposition: A | Discharge status: Home / Self Care | Payer: Medicare Other | Attending: Emergency Medicine | Admitting: Emergency Medicine

## 2016-11-21 ENCOUNTER — Encounter (HOSPITAL_COMMUNITY): Payer: Self-pay | Admitting: Nurse Practitioner

## 2016-11-21 DIAGNOSIS — T83098A Other mechanical complication of other indwelling urethral catheter, initial encounter: Secondary | ICD-10-CM | POA: Diagnosis not present

## 2016-11-21 DIAGNOSIS — T839XXA Unspecified complication of genitourinary prosthetic device, implant and graft, initial encounter: Secondary | ICD-10-CM

## 2016-11-21 DIAGNOSIS — I129 Hypertensive chronic kidney disease with stage 1 through stage 4 chronic kidney disease, or unspecified chronic kidney disease: Secondary | ICD-10-CM | POA: Diagnosis not present

## 2016-11-21 DIAGNOSIS — R339 Retention of urine, unspecified: Secondary | ICD-10-CM | POA: Diagnosis present

## 2016-11-21 DIAGNOSIS — N3000 Acute cystitis without hematuria: Secondary | ICD-10-CM | POA: Diagnosis not present

## 2016-11-21 DIAGNOSIS — Z7982 Long term (current) use of aspirin: Secondary | ICD-10-CM | POA: Diagnosis not present

## 2016-11-21 DIAGNOSIS — Z79899 Other long term (current) drug therapy: Secondary | ICD-10-CM | POA: Insufficient documentation

## 2016-11-21 DIAGNOSIS — Y732 Prosthetic and other implants, materials and accessory gastroenterology and urology devices associated with adverse incidents: Secondary | ICD-10-CM | POA: Diagnosis not present

## 2016-11-21 DIAGNOSIS — Z87891 Personal history of nicotine dependence: Secondary | ICD-10-CM | POA: Diagnosis not present

## 2016-11-21 DIAGNOSIS — N183 Chronic kidney disease, stage 3 (moderate): Secondary | ICD-10-CM | POA: Insufficient documentation

## 2016-11-21 LAB — URINALYSIS, ROUTINE W REFLEX MICROSCOPIC
Bilirubin Urine: NEGATIVE
GLUCOSE, UA: NEGATIVE mg/dL
KETONES UR: NEGATIVE mg/dL
NITRITE: POSITIVE — AB
PROTEIN: NEGATIVE mg/dL
Specific Gravity, Urine: 1.01 (ref 1.005–1.030)
pH: 8.5 — ABNORMAL HIGH (ref 5.0–8.0)

## 2016-11-21 LAB — URINALYSIS, MICROSCOPIC (REFLEX)

## 2016-11-21 MED ORDER — LIDOCAINE HCL 2 % EX GEL
1.0000 "application " | Freq: Once | CUTANEOUS | Status: AC | PRN
  Administered 2016-11-21: 1 via URETHRAL
  Filled 2016-11-21: qty 11

## 2016-11-21 MED ORDER — CEPHALEXIN 500 MG PO CAPS: 500.0000 mg | ORAL_CAPSULE | Freq: Two times a day (BID) | ORAL | 0 refills | Status: DC

## 2016-11-21 NOTE — ED Provider Notes (Signed)
[facility] DEPT Provider Note   CSN: [id number] Arrival date & time: 11/21/16  0259     History   Chief Complaint Chief Complaint  Patient presents with  . Urinary Retention    HPI Nova Evett is a 66 y.o. male.  The history is provided by the patient and medical records.    66 year old male with history of anemia, BPH, chronic kidney disease, hyperlipidemia, hypertension, obstructive uropathy with chronic Foley catheter, presenting to the ED with urinary retention. Patient states around 7 PM last evening he noticed that his catheter was not draining very much. States he tried to flush it a few times, but still did not have any urine collection in the back. States he went to bed but woke up around 2 AM with abdominal fullness. States he felt urge to urinate, but still had no urine output. States he tried again to flush his catheter without results. He eventually pulled out the catheter himself and came here to have a new one placed. States he was due for catheter change in 2 days anyway. He denies any fever or chills. He has not had any back pain. He is followed by [facility], Dr. Pilar Jarvis.  Past Medical History:  Diagnosis Date  . Anemia    normal Fe, nl B12, nl retic, nl EPO July '13  . Blood transfusion without reported diagnosis   . BPH (benign prostatic hyperplasia)   . Chronic back pain   . Chronic kidney disease    CKD III, obstructive nephropathy  . Diverticulosis   . Dysuria   . Elevated PSA, greater than or equal to 20 ng/ml June '13   PSA 107  . Hemorrhoids, internal, with bleeding, prolapse 09/19/2014  . Hyperlipidemia 05/29/2014  . Hypertension   . Obstructive uropathy 11/27/2015  . Renal insufficiency   . Tuberculosis    h/o PPD +  . UTI (urinary tract infection)     Patient Active Problem List   Diagnosis Date Noted  . Degenerative arthritis of knee, bilateral 11/18/2016  . Urinary retention due to benign prostatic hyperplasia 10/24/2016  . Mass  of right side of neck 10/20/2016  . Gout 10/20/2016  . Knee pain, acute 10/12/2016  . Hypokalemia 05/28/2016  . Encounter for well adult exam with abnormal findings 11/27/2015  . Obstructive uropathy 11/27/2015  . Elevated PSA 11/27/2015  . Acute pyelonephritis 05/27/2015  . Generalized bloating 05/27/2015  . Constipation 05/27/2015  . Chest pain 05/27/2015  . AKI (acute kidney injury) ([place]) 05/27/2015  . Acute sinus infection 11/22/2014  . Cough 09/25/2014  . Hemorrhoids, internal, with bleeding, prolapse 09/19/2014  . 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 Hyperlipidemia 05/29/2014  . Lumbar radiculopathy 05/23/2014  . Lumbar stenosis 11/20/2013  . Abnormal glucose 11/06/2013  . Unspecified hereditary and idiopathic peripheral neuropathy 01/22/2013  . 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 CKD (chronic kidney disease) stage 3, GFR 30-59 ml/min 11/24/2011  . Hematuria 11/24/2011  . Hydronephrosis 11/24/2011  . Anemia 11/24/2011  . BRADYCARDIA 02/05/2010  . TINEA PEDIS 01/29/2010  . 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 PERIPHERAL EDEMA 01/29/2010  . ABNORMAL ELECTROCARDIOGRAM 01/29/2010  . 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  . Osteoarthrosis, generalized, 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  . ONYCHOMYCOSIS, TOENAILS 10/02/2007  . BPH (benign prostatic hyperplasia) 10/02/2007  . Essential hypertension 03/06/2007    Past Surgical History:  Procedure Laterality Date  . CARPAL TUNNEL RELEASE Right   . COLONOSCOPY    . HEMORRHOID BANDING    . PROSTATE SURGERY    .  SPLENECTOMY         Home Medications    Prior to Admission medications   Medication Sig Start Date End Date Taking? Authorizing Provider  acetaminophen (TYLENOL) 325 MG tablet Take 2 tablets (650 mg total) by mouth every 6 (six) hours as needed. 05/23/15   Varney Biles, MD  Acetaminophen-Codeine (TYLENOL/CODEINE #3) 300-30 MG tablet Take 0.5-1 tablets by mouth every 6 (six) hours as needed for pain.  10/12/16   Plotnikov, Evie Lacks, MD  allopurinol (ZYLOPRIM) 100 MG tablet Take 1 tablet (100 mg total) by mouth daily. 10/20/16   Biagio Borg, MD  amLODipine (NORVASC) 5 MG tablet Take 1 tablet (5 mg total) by mouth daily. 10/12/16   Plotnikov, Evie Lacks, MD  aspirin 81 MG tablet Take 81 mg by mouth daily.    [provider]  atorvastatin (LIPITOR) 40 MG tablet Take 1 tablet (40 mg total) by mouth daily. 10/20/16   Biagio Borg, MD  finasteride (PROSCAR) 5 MG tablet Take 5 mg by mouth daily.    [provider]  metoprolol tartrate (LOPRESSOR) 25 MG tablet Take 1 tablet (25 mg total) by mouth 2 (two) times daily. 10/26/16   Geradine Girt, DO  oxybutynin (DITROPAN) 5 MG tablet Take 1 tablet (5 mg total) by mouth daily. Patient taking differently: Take 5 mg by mouth daily as needed for bladder spasms.  05/29/14   Biagio Borg, MD  oxyCODONE-acetaminophen (PERCOCET/ROXICET) 5-325 MG tablet Take 2 tablets by mouth every 4 (four) hours as needed for severe pain. 11/10/16   Fransico Meadow, PA-C  predniSONE (DELTASONE) 10 MG tablet 6,5,4,3,2,1 taper 11/10/16   Fransico Meadow, PA-C  tamsulosin (FLOMAX) 0.4 MG CAPS capsule Take 1 capsule by mouth 2 (two) times daily. 10/22/16   [provider]    Family History Family History  Problem Relation Age of Onset  . Diabetes Mother   . Stroke Brother   . Hearing loss Brother   . Colon cancer Neg Hx   . Rectal cancer Neg Hx   . Stomach cancer Neg Hx   . Esophageal cancer Neg Hx     Social History Social History  Substance Use Topics  . Smoking status: Former Smoker    Types: Cigarettes  . Smokeless tobacco: Never Used     Comment: Quit 1981  . Alcohol use No     Comment: Quit 2004     Allergies   Patient has no known allergies.   Review of Systems Review of Systems  Genitourinary: Positive for decreased urine volume.  All other systems reviewed and are negative.    Physical Exam Updated Vital Signs BP (!)  151/98 (BP Location: Right Arm)   Pulse 87   Temp 98.3 F (36.8 C) (Oral)   Ht 6\' 1"  (1.854 m)   Wt 116.6 kg (257 lb)   SpO2 97%   BMI 33.91 kg/m   Physical Exam  Constitutional: He is oriented to person, place, and time. He appears well-developed and well-nourished.  HENT:  Head: Normocephalic and atraumatic.  Mouth/Throat: Oropharynx is clear and moist.  Eyes: Conjunctivae and EOM are normal. Pupils are equal, round, and reactive to light.  Neck: Normal range of motion.  Cardiovascular: Normal rate, regular rhythm and normal heart sounds.   Pulmonary/Chest: Effort normal and breath sounds normal. No respiratory distress. He has no wheezes.  Abdominal: Soft. Bowel sounds are normal. There is no tenderness. There is no rebound.  Genitourinary:  Genitourinary  Comments: new Foley catheter has been placed, there is approximately 800cc yellow urine noted in collection bag at this time  Musculoskeletal: Normal range of motion.  Neurological: He is alert and oriented to person, place, and time.  Skin: Skin is warm and dry.  Psychiatric: He has a normal mood and affect.  Nursing note and vitals reviewed.    ED Treatments / Results  Labs (all labs ordered are listed, but only abnormal results are displayed) Labs Reviewed  URINALYSIS, ROUTINE W REFLEX MICROSCOPIC - Abnormal; Notable for the following:       Result Value   pH 8.5 (*)    Hgb urine dipstick SMALL (*)    Nitrite POSITIVE (*)    Leukocytes, UA LARGE (*)    All other components within normal limits  URINALYSIS, MICROSCOPIC (REFLEX) - Abnormal; Notable for the following:    Bacteria, UA MANY (*)    Squamous Epithelial / LPF 0-5 (*)    All other components within normal limits    EKG  EKG Interpretation None       Radiology No results found.  Procedures Procedures (including critical care time)  Medications Ordered in ED Medications  lidocaine (XYLOCAINE) 2 % jelly 1 application (1 application Urethral  Given 11/21/16 0457)     Initial Impression / Assessment and Plan / ED Course  I have reviewed the triage vital signs and the nursing notes.  Pertinent labs & imaging results that were available during my care of the patient were reviewed by me and considered in my medical decision making (see chart for details).  66 year old male here with urinary retention. Reports his catheter at home, "clogged". He did flush it without success. He ultimately removed it himself. On arrival here to catheter was placed with approximately 900 mL of yellow urine returned. Patient reports his abdominal fullness has resolved. UA specimen obtained revealing many bacteria, nitrite positive.  Will send for culture. His past few cultures have grown out a few different studies, most of which have been sensitive to cephalosporins so will start on a course of Keflex. I've encouraged him to follow-up closely with his urologist.  Discussed plan with patient, he acknowledged understanding and agreed with plan of care.  Return precautions given for new or worsening symptoms.  Final Clinical Impressions(s) / ED Diagnoses   Final diagnoses:  Foley catheter problem, initial encounter ([place])  Acute cystitis without hematuria    New Prescriptions New Prescriptions   CEPHALEXIN (KEFLEX) 500 MG CAPSULE    Take 1 capsule (500 mg total) by mouth 2 (two) times daily.     Larene Pickett, PA-C 11/21/16 Dawson, [place], DO 11/22/16 1200

## 2016-11-21 NOTE — Discharge Instructions (Signed)
Your urine did show some signs of infection today.  We have sent it for culture to make sure the antibiotics I prescribed for you will be effective. Take the prescribed medication as directed.  If your medicine needs to be changed, we will call your. Follow-up with your urologist if any ongoing issues. Return to the ED for new or worsening symptoms.

## 2016-11-21 NOTE — ED Triage Notes (Signed)
Patient came in [place] with c/o of urinary retention. States he generally see his urologist and has a chronic catheter placed, however he noticed he hadn't had any urinary output in over 5 hours so he attempted to flush catheter. He stated his bladder felt full which made him pay attention to no output. When he attempted to flush several times the catheter would not flush so he decided to take the catheter out himself and come to ER to have another one inserted. C/o abdominal/bladder fullness. Denies fever or chills. Denies urinary symptoms prior to retention. States he has an appointment with his urologist on Tuesday and was supposed to have catheter changed out then.

## 2016-11-22 LAB — URINE CULTURE

## 2016-11-25 DIAGNOSIS — R600 Localized edema: Secondary | ICD-10-CM | POA: Diagnosis not present

## 2016-11-25 DIAGNOSIS — D649 Anemia, unspecified: Secondary | ICD-10-CM | POA: Diagnosis not present

## 2016-11-25 DIAGNOSIS — Z87891 Personal history of nicotine dependence: Secondary | ICD-10-CM | POA: Diagnosis not present

## 2016-11-25 DIAGNOSIS — Z466 Encounter for fitting and adjustment of urinary device: Secondary | ICD-10-CM | POA: Diagnosis not present

## 2016-11-25 DIAGNOSIS — M109 Gout, unspecified: Secondary | ICD-10-CM | POA: Diagnosis not present

## 2016-11-25 DIAGNOSIS — E785 Hyperlipidemia, unspecified: Secondary | ICD-10-CM | POA: Diagnosis not present

## 2016-11-25 DIAGNOSIS — R339 Retention of urine, unspecified: Secondary | ICD-10-CM | POA: Diagnosis not present

## 2016-11-25 DIAGNOSIS — K648 Other hemorrhoids: Secondary | ICD-10-CM | POA: Diagnosis not present

## 2016-11-25 DIAGNOSIS — N139 Obstructive and reflux uropathy, unspecified: Secondary | ICD-10-CM | POA: Diagnosis not present

## 2016-11-25 DIAGNOSIS — N401 Enlarged prostate with lower urinary tract symptoms: Secondary | ICD-10-CM | POA: Diagnosis not present

## 2016-11-25 DIAGNOSIS — K5790 Diverticulosis of intestine, part unspecified, without perforation or abscess without bleeding: Secondary | ICD-10-CM | POA: Diagnosis not present

## 2016-11-25 DIAGNOSIS — M150 Primary generalized (osteo)arthritis: Secondary | ICD-10-CM | POA: Diagnosis not present

## 2016-11-25 DIAGNOSIS — I129 Hypertensive chronic kidney disease with stage 1 through stage 4 chronic kidney disease, or unspecified chronic kidney disease: Secondary | ICD-10-CM | POA: Diagnosis not present

## 2016-11-25 DIAGNOSIS — N183 Chronic kidney disease, stage 3 (moderate): Secondary | ICD-10-CM | POA: Diagnosis not present

## 2016-11-26 ENCOUNTER — Ambulatory Visit: Payer: Medicare Other | Admitting: Internal Medicine

## 2016-11-27 ENCOUNTER — Telehealth: Payer: Self-pay | Admitting: Family Medicine

## 2016-11-27 ENCOUNTER — Emergency Department (HOSPITAL_COMMUNITY)
Admission: EM | Admit: 2016-11-27 | Discharge: 2016-11-27 | Disposition: A | Discharge status: Home / Self Care | Payer: Medicare Other | Attending: Emergency Medicine | Admitting: Emergency Medicine

## 2016-11-27 ENCOUNTER — Encounter (HOSPITAL_COMMUNITY): Payer: Self-pay

## 2016-11-27 ENCOUNTER — Emergency Department (HOSPITAL_COMMUNITY): Payer: Medicare Other

## 2016-11-27 DIAGNOSIS — N183 Chronic kidney disease, stage 3 (moderate): Secondary | ICD-10-CM | POA: Insufficient documentation

## 2016-11-27 DIAGNOSIS — D649 Anemia, unspecified: Secondary | ICD-10-CM | POA: Diagnosis not present

## 2016-11-27 DIAGNOSIS — K5790 Diverticulosis of intestine, part unspecified, without perforation or abscess without bleeding: Secondary | ICD-10-CM | POA: Diagnosis not present

## 2016-11-27 DIAGNOSIS — Z7982 Long term (current) use of aspirin: Secondary | ICD-10-CM | POA: Insufficient documentation

## 2016-11-27 DIAGNOSIS — M109 Gout, unspecified: Secondary | ICD-10-CM | POA: Diagnosis not present

## 2016-11-27 DIAGNOSIS — M150 Primary generalized (osteo)arthritis: Secondary | ICD-10-CM | POA: Diagnosis not present

## 2016-11-27 DIAGNOSIS — R339 Retention of urine, unspecified: Secondary | ICD-10-CM | POA: Diagnosis not present

## 2016-11-27 DIAGNOSIS — R600 Localized edema: Secondary | ICD-10-CM | POA: Diagnosis not present

## 2016-11-27 DIAGNOSIS — K648 Other hemorrhoids: Secondary | ICD-10-CM | POA: Diagnosis not present

## 2016-11-27 DIAGNOSIS — Z466 Encounter for fitting and adjustment of urinary device: Secondary | ICD-10-CM | POA: Diagnosis not present

## 2016-11-27 DIAGNOSIS — I129 Hypertensive chronic kidney disease with stage 1 through stage 4 chronic kidney disease, or unspecified chronic kidney disease: Secondary | ICD-10-CM | POA: Insufficient documentation

## 2016-11-27 DIAGNOSIS — N401 Enlarged prostate with lower urinary tract symptoms: Secondary | ICD-10-CM | POA: Diagnosis not present

## 2016-11-27 DIAGNOSIS — Z79899 Other long term (current) drug therapy: Secondary | ICD-10-CM | POA: Diagnosis not present

## 2016-11-27 DIAGNOSIS — Z87891 Personal history of nicotine dependence: Secondary | ICD-10-CM | POA: Diagnosis not present

## 2016-11-27 DIAGNOSIS — M7989 Other specified soft tissue disorders: Secondary | ICD-10-CM | POA: Diagnosis present

## 2016-11-27 DIAGNOSIS — M19072 Primary osteoarthritis, left ankle and foot: Secondary | ICD-10-CM | POA: Diagnosis not present

## 2016-11-27 DIAGNOSIS — N139 Obstructive and reflux uropathy, unspecified: Secondary | ICD-10-CM | POA: Diagnosis not present

## 2016-11-27 DIAGNOSIS — E785 Hyperlipidemia, unspecified: Secondary | ICD-10-CM | POA: Diagnosis not present

## 2016-11-27 LAB — CBC WITH DIFFERENTIAL/PLATELET
BASOS ABS: 0 10*3/uL (ref 0.0–0.1)
Basophils Relative: 0 %
Eosinophils Absolute: 0.1 10*3/uL (ref 0.0–0.7)
Eosinophils Relative: 1 %
HEMATOCRIT: 37.5 % — AB (ref 39.0–52.0)
Hemoglobin: 12.3 g/dL — ABNORMAL LOW (ref 13.0–17.0)
Lymphocytes Relative: 32 %
Lymphs Abs: 2.5 10*3/uL (ref 0.7–4.0)
MCH: 28.7 pg (ref 26.0–34.0)
MCHC: 32.8 g/dL (ref 30.0–36.0)
MCV: 87.4 fL (ref 78.0–100.0)
Monocytes Absolute: 1.1 10*3/uL — ABNORMAL HIGH (ref 0.1–1.0)
Monocytes Relative: 14 %
NEUTROS ABS: 4 10*3/uL (ref 1.7–7.7)
Neutrophils Relative %: 53 %
Platelets: 206 10*3/uL (ref 150–400)
RBC: 4.29 MIL/uL (ref 4.22–5.81)
RDW: 15.3 % (ref 11.5–15.5)
WBC: 7.7 10*3/uL (ref 4.0–10.5)

## 2016-11-27 LAB — BASIC METABOLIC PANEL
ANION GAP: 7 (ref 5–15)
BUN: 24 mg/dL — ABNORMAL HIGH (ref 6–20)
CHLORIDE: 102 mmol/L (ref 101–111)
CO2: 29 mmol/L (ref 22–32)
Calcium: 8.6 mg/dL — ABNORMAL LOW (ref 8.9–10.3)
Creatinine, Ser: 2.21 mg/dL — ABNORMAL HIGH (ref 0.61–1.24)
GFR calc Af Amer: 34 mL/min — ABNORMAL LOW (ref >60)
GFR calc non Af Amer: 29 mL/min — ABNORMAL LOW (ref >60)
GLUCOSE: 117 mg/dL — AB (ref 65–99)
POTASSIUM: 3.4 mmol/L — AB (ref 3.5–5.1)
Sodium: 138 mmol/L (ref 135–145)

## 2016-11-27 MED ORDER — COLCHICINE 0.6 MG PO TABS: 0.6000 mg | ORAL_TABLET | Freq: Two times a day (BID) | ORAL | 0 refills | Status: DC

## 2016-11-27 NOTE — Telephone Encounter (Signed)
Entered in delay. Received call from team health asking to connect me to [facility] nurse to discuss possible cellulitis in patients foot. She reports his left foot is swollen, warm, erythematous, and tender. He also has a temperature of 100.1 degrees F. She also reports patient is on keflex for UTI. I advised given description and elevated temperature patient should be evaluated today for this in the ED to determine if he would need IV antibiotics. She noted she would advise the patient of this.

## 2016-11-27 NOTE — ED Provider Notes (Signed)
[facility] DEPT Provider Note   CSN: [id number] Arrival date & time: 11/27/16  1349     History   Chief Complaint Chief Complaint  Patient presents with  . Fever  . Foot Swelling    HPI [name] is a 66 y.o. male.  HPI Pt presents to the ED for evaluation of foot pain and swelling.   Pt normally it is larger than his right foot.  It has been that way for years.   In the last couple of days however it got worse.   Pt had a home health nurse check on him.  His temp was elevated.  He was sent to the ED. Past Medical History:  Diagnosis Date  . Anemia    normal Fe, nl B12, nl retic, nl EPO July '13  . Blood transfusion without reported diagnosis   . BPH (benign prostatic hyperplasia)   . Chronic back pain   . Chronic kidney disease    CKD III, obstructive nephropathy  . Diverticulosis   . Dysuria   . Elevated PSA, greater than or equal to 20 ng/ml June '13   PSA 107  . Hemorrhoids, internal, with bleeding, prolapse 09/19/2014  . Hyperlipidemia 05/29/2014  . Hypertension   . Obstructive uropathy 11/27/2015  . Renal insufficiency   . Tuberculosis    h/o PPD +  . UTI (urinary tract infection)     Patient Active Problem List   Diagnosis Date Noted  . Degenerative arthritis of knee, bilateral 11/18/2016  . Urinary retention due to benign prostatic hyperplasia 10/24/2016  . Mass of right side of neck 10/20/2016  . Gout 10/20/2016  . Knee pain, acute 10/12/2016  . Hypokalemia 05/28/2016  . Encounter for well adult exam with abnormal findings 11/27/2015  . Obstructive uropathy 11/27/2015  . Elevated PSA 11/27/2015  . Acute pyelonephritis 05/27/2015  . Generalized bloating 05/27/2015  . Constipation 05/27/2015  . Chest pain 05/27/2015  . AKI (acute kidney injury) ([place]) 05/27/2015  . Acute sinus infection 11/22/2014  . Cough 09/25/2014  . Hemorrhoids, internal, with bleeding, prolapse 09/19/2014  . 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 Hyperlipidemia 05/29/2014  . Lumbar  radiculopathy 05/23/2014  . Lumbar stenosis 11/20/2013  . Abnormal glucose 11/06/2013  . Unspecified hereditary and idiopathic peripheral neuropathy 01/22/2013  . 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 CKD (chronic kidney disease) stage 3, GFR 30-59 ml/min 11/24/2011  . Hematuria 11/24/2011  . Hydronephrosis 11/24/2011  . Anemia 11/24/2011  . BRADYCARDIA 02/05/2010  . TINEA PEDIS 01/29/2010  . 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 PERIPHERAL EDEMA 01/29/2010  . ABNORMAL ELECTROCARDIOGRAM 01/29/2010  . 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  . Osteoarthrosis, generalized, 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  . ONYCHOMYCOSIS, TOENAILS 10/02/2007  . BPH (benign prostatic hyperplasia) 10/02/2007  . Essential hypertension 03/06/2007    Past Surgical History:  Procedure Laterality Date  . CARPAL TUNNEL RELEASE Right   . COLONOSCOPY    . HEMORRHOID BANDING    . PROSTATE SURGERY    . SPLENECTOMY         Home Medications    Prior to Admission medications   Medication Sig Start Date End Date Taking? Authorizing Provider  acetaminophen (TYLENOL) 325 MG tablet Take 2 tablets (650 mg total) by mouth every 6 (six) hours as needed. 05/23/15   Varney Biles, MD  Acetaminophen-Codeine (TYLENOL/CODEINE #3) 300-30 MG tablet Take 0.5-1 tablets by mouth every 6 (six) hours as needed for pain. 10/12/16   Plotnikov, Evie Lacks, MD  allopurinol (ZYLOPRIM) 100  MG tablet Take 1 tablet (100 mg total) by mouth daily. 10/20/16   Biagio Borg, MD  amLODipine (NORVASC) 5 MG tablet Take 1 tablet (5 mg total) by mouth daily. 10/12/16   Plotnikov, Evie Lacks, MD  aspirin 81 MG tablet Take 81 mg by mouth daily.    [provider]  atorvastatin (LIPITOR) 40 MG tablet Take 1 tablet (40 mg total) by mouth daily. 10/20/16   Biagio Borg, MD  cephALEXin (KEFLEX) 500 MG capsule Take 1 capsule (500 mg total) by mouth 2 (two) times daily. 11/21/16   Larene Pickett, PA-C  colchicine 0.6 MG tablet Take 1  tablet (0.6 mg total) by mouth 2 (two) times daily. 11/27/16   Dorie Rank, MD  finasteride (PROSCAR) 5 MG tablet Take 5 mg by mouth daily.    [provider]  metoprolol tartrate (LOPRESSOR) 25 MG tablet Take 1 tablet (25 mg total) by mouth 2 (two) times daily. 10/26/16   Geradine Girt, DO  oxybutynin (DITROPAN) 5 MG tablet Take 1 tablet (5 mg total) by mouth daily. Patient taking differently: Take 5 mg by mouth daily as needed for bladder spasms.  05/29/14   Biagio Borg, MD  oxyCODONE-acetaminophen (PERCOCET/ROXICET) 5-325 MG tablet Take 2 tablets by mouth every 4 (four) hours as needed for severe pain. 11/10/16   Fransico Meadow, PA-C  predniSONE (DELTASONE) 10 MG tablet 6,5,4,3,2,1 taper 11/10/16   Fransico Meadow, PA-C  tamsulosin (FLOMAX) 0.4 MG CAPS capsule Take 1 capsule by mouth 2 (two) times daily. 10/22/16   [provider]    Family History Family History  Problem Relation Age of Onset  . Diabetes Mother   . Stroke Brother   . Hearing loss Brother   . Colon cancer Neg Hx   . Rectal cancer Neg Hx   . Stomach cancer Neg Hx   . Esophageal cancer Neg Hx     Social History Social History  Substance Use Topics  . Smoking status: Former Smoker    Types: Cigarettes  . Smokeless tobacco: Never Used     Comment: Quit 1981  . Alcohol use No     Comment: Quit 2004     Allergies   Patient has no known allergies.   Review of Systems Review of Systems  All other systems reviewed and are negative.    Physical Exam Updated Vital Signs BP (!) 147/98 (BP Location: Left Arm)   Pulse 72   Temp 99.2 F (37.3 C) (Oral)   Resp 16   SpO2 95%   Physical Exam  Constitutional: He appears well-developed and well-nourished. No distress.  HENT:  Head: Normocephalic and atraumatic.  Right Ear: External ear normal.  Left Ear: External ear normal.  Eyes: Conjunctivae are normal. Right eye exhibits no discharge. Left eye exhibits no discharge. No scleral icterus.    Neck: Neck supple. No tracheal deviation present.  Cardiovascular: Normal rate, regular rhythm and normal heart sounds.   Pulmonary/Chest: Effort normal and breath sounds normal. No stridor. No respiratory distress. He has no wheezes. He has no rales.  Abdominal: Soft. He exhibits no distension. There is no tenderness. There is no guarding.  Musculoskeletal: He exhibits edema.       Left lower leg: Normal.       Left foot: There is tenderness and swelling. There is no deformity.  Neurological: He is alert. Cranial nerve deficit: no gross deficits.  Skin: Skin is warm and dry. No rash noted.  Psychiatric: He has a normal mood and affect.  Nursing note and vitals reviewed.    ED Treatments / Results  Labs (all labs ordered are listed, but only abnormal results are displayed) Labs Reviewed  CBC WITH DIFFERENTIAL/PLATELET - Abnormal; Notable for the following:       Result Value   Hemoglobin 12.3 (*)    HCT 37.5 (*)    Monocytes Absolute 1.1 (*)    All other components within normal limits  BASIC METABOLIC PANEL - Abnormal; Notable for the following:    Potassium 3.4 (*)    Glucose, Bld 117 (*)    BUN 24 (*)    Creatinine, Ser 2.21 (*)    Calcium 8.6 (*)    GFR calc non Af Amer 29 (*)    GFR calc Af Amer 34 (*)    All other components within normal limits     Radiology Dg Foot Complete Left  Result Date: 11/27/2016 CLINICAL DATA:  Swelling and pain in left foot for 'forever' per pt; no injury, most pain in dorsal aspect of left oot EXAM: LEFT FOOT - COMPLETE 3+ VIEW COMPARISON:  None. FINDINGS: No fracture or dislocation.  No bone lesion. There is marginal spurring dorsally at the navicular cuneiform articulation. Minor osteoarthritic changes are noted involving the first metatarsophalangeal joint. Bones are demineralized. There are small dorsal and plantar calcaneal spurs. Soft tissues are unremarkable. IMPRESSION: 1. No fracture or dislocation.  No acute finding. 2. Mild  degenerative changes along the dorsal aspect of the midfoot. Minor first metatarsophalangeal joint osteoarthritis. Heel spurs. Electronically Signed   By: Lajean Manes M.D.   On: 11/27/2016 15:25    Procedures Procedures (including critical care time)  Medications Ordered in ED Medications - No data to display   Initial Impression / Assessment and Plan / ED Course  I have reviewed the triage vital signs and the nursing notes.  Pertinent labs & imaging results that were available during my care of the patient were reviewed by me and considered in my medical decision making (see chart for details).  Clinical Course as of Nov 27 1709  Sat Nov 27, 2016  1448 10/24/16 Pt had doppler studies of both extremities to rule out DVT  [JK]    Clinical Course User Index [JK] Dorie Rank, MD    Patient presented to the emergency room with possible fever at home associated with foot swelling and tenderness. Patient is afebrile here in the emergency room. CBC and OXY panel are unremarkable. He had a previous Doppler test within the last few weeks that was negative.  I doubt acute infection such as cellulitis or osteomyelitis. I doubt DVT or PE  .  He Does have degenerative changes noted in the midfoot. He also has history of gout. I suspect his symptoms are related to gout. Final Clinical Impressions(s) / ED Diagnoses   Final diagnoses:  Gout, arthritis    New Prescriptions New Prescriptions   COLCHICINE 0.6 MG TABLET    Take 1 tablet (0.6 mg total) by mouth 2 (two) times daily.     Dorie Rank, MD 11/27/16 [phone]

## 2016-11-27 NOTE — Discharge Instructions (Signed)
Take the medications as prescribed, follow-up with her primary doctor next week to make sure your symptoms are improving

## 2016-11-27 NOTE — ED Triage Notes (Signed)
Pt has home health.  Aide today found pt to have fever 100.7.  Pt has foley cath chronic for kidneys.  Pt states no new cough.  No n/v/d.  Has chronic bilateral lower edema.  Pt states very painful.

## 2016-11-30 DIAGNOSIS — M109 Gout, unspecified: Secondary | ICD-10-CM | POA: Diagnosis not present

## 2016-11-30 DIAGNOSIS — R339 Retention of urine, unspecified: Secondary | ICD-10-CM | POA: Diagnosis not present

## 2016-11-30 DIAGNOSIS — E785 Hyperlipidemia, unspecified: Secondary | ICD-10-CM | POA: Diagnosis not present

## 2016-11-30 DIAGNOSIS — N183 Chronic kidney disease, stage 3 (moderate): Secondary | ICD-10-CM | POA: Diagnosis not present

## 2016-11-30 DIAGNOSIS — M150 Primary generalized (osteo)arthritis: Secondary | ICD-10-CM | POA: Diagnosis not present

## 2016-11-30 DIAGNOSIS — D649 Anemia, unspecified: Secondary | ICD-10-CM | POA: Diagnosis not present

## 2016-11-30 DIAGNOSIS — N139 Obstructive and reflux uropathy, unspecified: Secondary | ICD-10-CM | POA: Diagnosis not present

## 2016-11-30 DIAGNOSIS — Z87891 Personal history of nicotine dependence: Secondary | ICD-10-CM | POA: Diagnosis not present

## 2016-11-30 DIAGNOSIS — N401 Enlarged prostate with lower urinary tract symptoms: Secondary | ICD-10-CM | POA: Diagnosis not present

## 2016-11-30 DIAGNOSIS — K5790 Diverticulosis of intestine, part unspecified, without perforation or abscess without bleeding: Secondary | ICD-10-CM | POA: Diagnosis not present

## 2016-11-30 DIAGNOSIS — I129 Hypertensive chronic kidney disease with stage 1 through stage 4 chronic kidney disease, or unspecified chronic kidney disease: Secondary | ICD-10-CM | POA: Diagnosis not present

## 2016-11-30 DIAGNOSIS — Z466 Encounter for fitting and adjustment of urinary device: Secondary | ICD-10-CM | POA: Diagnosis not present

## 2016-11-30 DIAGNOSIS — R600 Localized edema: Secondary | ICD-10-CM | POA: Diagnosis not present

## 2016-11-30 DIAGNOSIS — K648 Other hemorrhoids: Secondary | ICD-10-CM | POA: Diagnosis not present

## 2016-12-01 ENCOUNTER — Encounter: Payer: Self-pay | Admitting: Internal Medicine

## 2016-12-01 ENCOUNTER — Telehealth: Payer: Self-pay

## 2016-12-01 ENCOUNTER — Ambulatory Visit (INDEPENDENT_AMBULATORY_CARE_PROVIDER_SITE_OTHER): Payer: Medicare Other | Admitting: Internal Medicine

## 2016-12-01 VITALS — BP 138/84 | HR 67 | Ht 73.0 in | Wt 247.0 lb

## 2016-12-01 DIAGNOSIS — R7309 Other abnormal glucose: Secondary | ICD-10-CM | POA: Diagnosis not present

## 2016-12-01 DIAGNOSIS — I10 Essential (primary) hypertension: Secondary | ICD-10-CM

## 2016-12-01 DIAGNOSIS — M109 Gout, unspecified: Secondary | ICD-10-CM | POA: Insufficient documentation

## 2016-12-01 MED ORDER — METHYLPREDNISOLONE ACETATE 80 MG/ML IJ SUSP
80.0000 mg | Freq: Once | INTRAMUSCULAR | Status: AC
  Administered 2016-12-01: 80 mg via INTRAMUSCULAR

## 2016-12-01 MED ORDER — PREDNISONE 10 MG PO TABS: ORAL_TABLET | ORAL | 0 refills | Status: DC

## 2016-12-01 MED ORDER — OXYCODONE-ACETAMINOPHEN 5-325 MG PO TABS: 2.0000 | ORAL_TABLET | ORAL | 0 refills | Status: DC | PRN

## 2016-12-01 NOTE — Progress Notes (Signed)
Subjective:    Patient ID: [name], male    DOB: 29-Mar-1951, 66 y.o.   MRN: [id number]  HPI  Here after being seen in ED June 23 with fever and foot swelling acute on chronic, also mentioned  Temp 100.1 with home health nurse then 100.7 just prior to ED visit.  Lab eval with cbc was not concern, has had previous fairly recent neg right leg venous doppler test. Was found to have evidence for mid foot arthritis but symptoms felt c/w acute gouty arthritis.   Was tx with colchine but no better after good med compliance, in fact even worse now per pt, pain is severe, and ankle with excruciating pain and swelling so that he limps to walk.  No further fever or chills since ED visit. Pt denies chest pain, increased sob or doe, wheezing, orthopnea, PND, increased LE swelling, palpitations, dizziness or syncope.   Pt denies polydipsia, polyuria. Pt denies new neurological symptoms such as new headache, or facial or extremity weakness or numbness Past Medical History:  Diagnosis Date  . Anemia    normal Fe, nl B12, nl retic, nl EPO July '13  . Blood transfusion without reported diagnosis   . BPH (benign prostatic hyperplasia)   . Chronic back pain   . Chronic kidney disease    CKD III, obstructive nephropathy  . Diverticulosis   . Dysuria   . Elevated PSA, greater than or equal to 20 ng/ml June '13   PSA 107  . Hemorrhoids, internal, with bleeding, prolapse 09/19/2014  . Hyperlipidemia 05/29/2014  . Hypertension   . Obstructive uropathy 11/27/2015  . Renal insufficiency   . Tuberculosis    h/o PPD +  . UTI (urinary tract infection)    Past Surgical History:  Procedure Laterality Date  . CARPAL TUNNEL RELEASE Right   . COLONOSCOPY    . HEMORRHOID BANDING    . PROSTATE SURGERY    . SPLENECTOMY      reports that he has quit smoking. His smoking use included Cigarettes. He has never used smokeless tobacco. He reports that he does not drink alcohol or use drugs. family history includes  Diabetes in his mother; Hearing loss in his brother; Stroke in his brother. No Known Allergies Current Outpatient Prescriptions on File Prior to Visit  Medication Sig Dispense Refill  . acetaminophen (TYLENOL) 325 MG tablet Take 2 tablets (650 mg total) by mouth every 6 (six) hours as needed. 12 tablet 0  . Acetaminophen-Codeine (TYLENOL/CODEINE #3) 300-30 MG tablet Take 0.5-1 tablets by mouth every 6 (six) hours as needed for pain. 30 tablet 0  . allopurinol (ZYLOPRIM) 100 MG tablet Take 1 tablet (100 mg total) by mouth daily. 90 tablet 3  . amLODipine (NORVASC) 5 MG tablet Take 1 tablet (5 mg total) by mouth daily. 90 tablet 2  . aspirin 81 MG tablet Take 81 mg by mouth daily.    Marland Kitchen atorvastatin (LIPITOR) 40 MG tablet Take 1 tablet (40 mg total) by mouth daily. 90 tablet 3  . colchicine 0.6 MG tablet Take 1 tablet (0.6 mg total) by mouth 2 (two) times daily. 14 tablet 0  . finasteride (PROSCAR) 5 MG tablet Take 5 mg by mouth daily.    . metoprolol tartrate (LOPRESSOR) 25 MG tablet Take 1 tablet (25 mg total) by mouth 2 (two) times daily. 60 tablet 0  . oxybutynin (DITROPAN) 5 MG tablet Take 1 tablet (5 mg total) by mouth daily. (Patient taking differently: Take 5 mg  by mouth daily as needed for bladder spasms. ) 90 tablet 3  . tamsulosin (FLOMAX) 0.4 MG CAPS capsule Take 1 capsule by mouth 2 (two) times daily.     No current facility-administered medications on file prior to visit.    Review of Systems  Constitutional: Negative for other unusual diaphoresis or sweats HENT: Negative for ear discharge or swelling Eyes: Negative for other worsening visual disturbances Respiratory: Negative for stridor or other swelling  Gastrointestinal: Negative for worsening distension or other blood Genitourinary: Negative for retention or other urinary change Musculoskeletal: Negative for other MSK pain or swelling Skin: Negative for color change or other new lesions Neurological: Negative for worsening  tremors and other numbness  Psychiatric/Behavioral: Negative for worsening agitation or other fatigue All other system neg per pt    Objective:   Physical Exam BP 138/84   Pulse 67   Ht 6\' 1"  (1.854 m)   Wt 247 lb (112 kg)   SpO2 98%   BMI 32.59 kg/m  VS noted,  Constitutional: Pt appears in NAD HENT: Head: NCAT.  Right Ear: External ear normal.  Left Ear: External ear normal.  Eyes: . Pupils are equal, round, and reactive to light. Conjunctivae and EOM are normal Nose: without d/c or deformity Neck: Neck supple. Gross normal ROM Cardiovascular: Normal rate and regular rhythm.   Pulmonary/Chest: Effort normal and breath sounds without rales or wheezing.  Neurological: Pt is alert. At baseline orientation, motor grossly intact Skin: Skin is warm. No rashes, other new lesions, no RLE edema, but has 3+ red,tender, swelling, warmth primarily to ankle And knee with all but tender element is assoc with right foot swelling and leg swelling to above the knee; no assoc  Psychiatric: Pt behavior is normal without agitation  No other exam findings     Assessment & Plan:

## 2016-12-01 NOTE — Assessment & Plan Note (Signed)
Unfortunately worse today with suspected distal foot, ankle and left knee involvement with diffuse leg involvement, tx limited by CKD as cannot take higher dose allopurinol or nsaid prn; will tx with depomedrol 80 IM, predpac asd, percocet refill for limited prn use

## 2016-12-01 NOTE — Telephone Encounter (Signed)
-----   Message from Biagio Borg, MD sent at 12/01/2016  3:03 PM EDT ----- Ok to drink more fluids in the next few days ----- Message ----- From: Juliet Rude, CMA Sent: 12/01/2016   2:50 PM To: Biagio Borg, MD  Pt wanted me to let you know that he has been having dizziness when he stands up from a sitting position. Please advise. He would like for me to call him.

## 2016-12-01 NOTE — Patient Instructions (Signed)
You had the steroid shot today  Please take all new medication as prescribed - the prednisone, and the percocet as needed for pain  Please continue all other medications as before, and refills have been done if requested.  Please have the pharmacy call with any other refills you may need.  Please keep your appointments with your specialists as you may have planned

## 2016-12-01 NOTE — Telephone Encounter (Signed)
Pt has been informed and expressed understanding.  

## 2016-12-02 DIAGNOSIS — M170 Bilateral primary osteoarthritis of knee: Secondary | ICD-10-CM | POA: Diagnosis not present

## 2016-12-04 DIAGNOSIS — M150 Primary generalized (osteo)arthritis: Secondary | ICD-10-CM | POA: Diagnosis not present

## 2016-12-04 DIAGNOSIS — E785 Hyperlipidemia, unspecified: Secondary | ICD-10-CM | POA: Diagnosis not present

## 2016-12-04 DIAGNOSIS — K648 Other hemorrhoids: Secondary | ICD-10-CM | POA: Diagnosis not present

## 2016-12-04 DIAGNOSIS — I129 Hypertensive chronic kidney disease with stage 1 through stage 4 chronic kidney disease, or unspecified chronic kidney disease: Secondary | ICD-10-CM | POA: Diagnosis not present

## 2016-12-04 DIAGNOSIS — K5790 Diverticulosis of intestine, part unspecified, without perforation or abscess without bleeding: Secondary | ICD-10-CM | POA: Diagnosis not present

## 2016-12-04 DIAGNOSIS — R600 Localized edema: Secondary | ICD-10-CM | POA: Diagnosis not present

## 2016-12-04 DIAGNOSIS — M109 Gout, unspecified: Secondary | ICD-10-CM | POA: Diagnosis not present

## 2016-12-04 DIAGNOSIS — Z466 Encounter for fitting and adjustment of urinary device: Secondary | ICD-10-CM | POA: Diagnosis not present

## 2016-12-04 DIAGNOSIS — D649 Anemia, unspecified: Secondary | ICD-10-CM | POA: Diagnosis not present

## 2016-12-04 DIAGNOSIS — N401 Enlarged prostate with lower urinary tract symptoms: Secondary | ICD-10-CM | POA: Diagnosis not present

## 2016-12-04 DIAGNOSIS — R339 Retention of urine, unspecified: Secondary | ICD-10-CM | POA: Diagnosis not present

## 2016-12-04 DIAGNOSIS — N139 Obstructive and reflux uropathy, unspecified: Secondary | ICD-10-CM | POA: Diagnosis not present

## 2016-12-04 DIAGNOSIS — N183 Chronic kidney disease, stage 3 (moderate): Secondary | ICD-10-CM | POA: Diagnosis not present

## 2016-12-04 DIAGNOSIS — Z87891 Personal history of nicotine dependence: Secondary | ICD-10-CM | POA: Diagnosis not present

## 2016-12-04 NOTE — Assessment & Plan Note (Signed)
stable overall by history and exam, recent data reviewed with pt, and pt to continue medical treatment as before,  to f/u any worsening symptoms or concerns Lab Results  Component Value Date   HGBA1C 5.8 11/27/2015

## 2016-12-07 DIAGNOSIS — K5790 Diverticulosis of intestine, part unspecified, without perforation or abscess without bleeding: Secondary | ICD-10-CM | POA: Diagnosis not present

## 2016-12-07 DIAGNOSIS — I129 Hypertensive chronic kidney disease with stage 1 through stage 4 chronic kidney disease, or unspecified chronic kidney disease: Secondary | ICD-10-CM | POA: Diagnosis not present

## 2016-12-07 DIAGNOSIS — N183 Chronic kidney disease, stage 3 (moderate): Secondary | ICD-10-CM | POA: Diagnosis not present

## 2016-12-07 DIAGNOSIS — M109 Gout, unspecified: Secondary | ICD-10-CM | POA: Diagnosis not present

## 2016-12-07 DIAGNOSIS — Z87891 Personal history of nicotine dependence: Secondary | ICD-10-CM | POA: Diagnosis not present

## 2016-12-07 DIAGNOSIS — N139 Obstructive and reflux uropathy, unspecified: Secondary | ICD-10-CM | POA: Diagnosis not present

## 2016-12-07 DIAGNOSIS — R339 Retention of urine, unspecified: Secondary | ICD-10-CM | POA: Diagnosis not present

## 2016-12-07 DIAGNOSIS — Z466 Encounter for fitting and adjustment of urinary device: Secondary | ICD-10-CM | POA: Diagnosis not present

## 2016-12-07 DIAGNOSIS — N401 Enlarged prostate with lower urinary tract symptoms: Secondary | ICD-10-CM | POA: Diagnosis not present

## 2016-12-07 DIAGNOSIS — R600 Localized edema: Secondary | ICD-10-CM | POA: Diagnosis not present

## 2016-12-07 DIAGNOSIS — E785 Hyperlipidemia, unspecified: Secondary | ICD-10-CM | POA: Diagnosis not present

## 2016-12-07 DIAGNOSIS — K648 Other hemorrhoids: Secondary | ICD-10-CM | POA: Diagnosis not present

## 2016-12-07 DIAGNOSIS — M150 Primary generalized (osteo)arthritis: Secondary | ICD-10-CM | POA: Diagnosis not present

## 2016-12-07 DIAGNOSIS — D649 Anemia, unspecified: Secondary | ICD-10-CM | POA: Diagnosis not present

## 2016-12-10 ENCOUNTER — Telehealth: Payer: Self-pay | Admitting: Internal Medicine

## 2016-12-10 DIAGNOSIS — N139 Obstructive and reflux uropathy, unspecified: Secondary | ICD-10-CM | POA: Diagnosis not present

## 2016-12-10 DIAGNOSIS — R600 Localized edema: Secondary | ICD-10-CM | POA: Diagnosis not present

## 2016-12-10 DIAGNOSIS — R339 Retention of urine, unspecified: Secondary | ICD-10-CM | POA: Diagnosis not present

## 2016-12-10 DIAGNOSIS — Z466 Encounter for fitting and adjustment of urinary device: Secondary | ICD-10-CM | POA: Diagnosis not present

## 2016-12-10 DIAGNOSIS — D649 Anemia, unspecified: Secondary | ICD-10-CM | POA: Diagnosis not present

## 2016-12-10 DIAGNOSIS — E785 Hyperlipidemia, unspecified: Secondary | ICD-10-CM | POA: Diagnosis not present

## 2016-12-10 DIAGNOSIS — M109 Gout, unspecified: Secondary | ICD-10-CM | POA: Diagnosis not present

## 2016-12-10 DIAGNOSIS — N401 Enlarged prostate with lower urinary tract symptoms: Secondary | ICD-10-CM | POA: Diagnosis not present

## 2016-12-10 DIAGNOSIS — Z87891 Personal history of nicotine dependence: Secondary | ICD-10-CM | POA: Diagnosis not present

## 2016-12-10 DIAGNOSIS — N183 Chronic kidney disease, stage 3 (moderate): Secondary | ICD-10-CM | POA: Diagnosis not present

## 2016-12-10 DIAGNOSIS — K648 Other hemorrhoids: Secondary | ICD-10-CM | POA: Diagnosis not present

## 2016-12-10 DIAGNOSIS — M150 Primary generalized (osteo)arthritis: Secondary | ICD-10-CM | POA: Diagnosis not present

## 2016-12-10 DIAGNOSIS — K5790 Diverticulosis of intestine, part unspecified, without perforation or abscess without bleeding: Secondary | ICD-10-CM | POA: Diagnosis not present

## 2016-12-10 DIAGNOSIS — K1121 Acute sialoadenitis: Secondary | ICD-10-CM | POA: Diagnosis not present

## 2016-12-10 DIAGNOSIS — I129 Hypertensive chronic kidney disease with stage 1 through stage 4 chronic kidney disease, or unspecified chronic kidney disease: Secondary | ICD-10-CM | POA: Diagnosis not present

## 2016-12-10 MED ORDER — COLCHICINE 0.6 MG PO TABS: 0.6000 mg | ORAL_TABLET | Freq: Every day | ORAL | 11 refills | Status: DC

## 2016-12-10 NOTE — Telephone Encounter (Signed)
Ok, done erx 

## 2016-12-10 NOTE — Telephone Encounter (Signed)
Called stating the Pt is having severe pain for his Gout, he has taken Colchicine previously, and she would like to know if he can go back on this Med

## 2016-12-10 NOTE — Telephone Encounter (Signed)
Informed [name] at [facility]

## 2016-12-14 DIAGNOSIS — M150 Primary generalized (osteo)arthritis: Secondary | ICD-10-CM | POA: Diagnosis not present

## 2016-12-14 DIAGNOSIS — Z87891 Personal history of nicotine dependence: Secondary | ICD-10-CM | POA: Diagnosis not present

## 2016-12-14 DIAGNOSIS — N401 Enlarged prostate with lower urinary tract symptoms: Secondary | ICD-10-CM | POA: Diagnosis not present

## 2016-12-14 DIAGNOSIS — E785 Hyperlipidemia, unspecified: Secondary | ICD-10-CM | POA: Diagnosis not present

## 2016-12-14 DIAGNOSIS — Z466 Encounter for fitting and adjustment of urinary device: Secondary | ICD-10-CM | POA: Diagnosis not present

## 2016-12-14 DIAGNOSIS — R339 Retention of urine, unspecified: Secondary | ICD-10-CM | POA: Diagnosis not present

## 2016-12-14 DIAGNOSIS — N183 Chronic kidney disease, stage 3 (moderate): Secondary | ICD-10-CM | POA: Diagnosis not present

## 2016-12-14 DIAGNOSIS — R600 Localized edema: Secondary | ICD-10-CM | POA: Diagnosis not present

## 2016-12-14 DIAGNOSIS — M109 Gout, unspecified: Secondary | ICD-10-CM | POA: Diagnosis not present

## 2016-12-14 DIAGNOSIS — K648 Other hemorrhoids: Secondary | ICD-10-CM | POA: Diagnosis not present

## 2016-12-14 DIAGNOSIS — N139 Obstructive and reflux uropathy, unspecified: Secondary | ICD-10-CM | POA: Diagnosis not present

## 2016-12-14 DIAGNOSIS — D649 Anemia, unspecified: Secondary | ICD-10-CM | POA: Diagnosis not present

## 2016-12-14 DIAGNOSIS — K5790 Diverticulosis of intestine, part unspecified, without perforation or abscess without bleeding: Secondary | ICD-10-CM | POA: Diagnosis not present

## 2016-12-14 DIAGNOSIS — I129 Hypertensive chronic kidney disease with stage 1 through stage 4 chronic kidney disease, or unspecified chronic kidney disease: Secondary | ICD-10-CM | POA: Diagnosis not present

## 2016-12-16 ENCOUNTER — Ambulatory Visit: Payer: Medicare Other | Admitting: Family Medicine

## 2016-12-16 ENCOUNTER — Ambulatory Visit (INDEPENDENT_AMBULATORY_CARE_PROVIDER_SITE_OTHER): Payer: Medicare Other | Admitting: Family Medicine

## 2016-12-16 ENCOUNTER — Encounter: Payer: Self-pay | Admitting: Family Medicine

## 2016-12-16 DIAGNOSIS — M170 Bilateral primary osteoarthritis of knee: Secondary | ICD-10-CM | POA: Diagnosis not present

## 2016-12-16 NOTE — Assessment & Plan Note (Signed)
Stable, responding to conservative therapy. Follow-up again in 6-8 weeks.

## 2016-12-16 NOTE — Progress Notes (Signed)
[name] [place],  [address] Phone: [phone] Subjective:    I'm seeing this patient by the request  of:  Biagio Borg, MD   CC: Knee pain f/u  [id number]  [name] is a 66 y.o. male coming in with complaint of knee pain. Past medical history significant gout. Found to have more of an gouty arthropathy with the knee effusions. Patient elected do conservative therapy including over-the-counter medications, diet controlled, and home exercises. Patient states that he is doing approximately 50% better. Still carries a cane but does not use it a significant amount when he is walking. Denies any numbness, denies any new symptoms. Still has some mild instability but very minimal he states.    Past Medical History:  Diagnosis Date  . Anemia    normal Fe, nl B12, nl retic, nl EPO July '13  . Blood transfusion without reported diagnosis   . BPH (benign prostatic hyperplasia)   . Chronic back pain   . Chronic kidney disease    CKD III, obstructive nephropathy  . Diverticulosis   . Dysuria   . Elevated PSA, greater than or equal to 20 ng/ml June '13   PSA 107  . Hemorrhoids, internal, with bleeding, prolapse 09/19/2014  . Hyperlipidemia 05/29/2014  . Hypertension   . Obstructive uropathy 11/27/2015  . Renal insufficiency   . Tuberculosis    h/o PPD +  . UTI (urinary tract infection)    Past Surgical History:  Procedure Laterality Date  . CARPAL TUNNEL RELEASE Right   . COLONOSCOPY    . HEMORRHOID BANDING    . PROSTATE SURGERY    . SPLENECTOMY     Social History   Social History  . Marital status: Married    Spouse name: N/A  . Number of children: 2  . Years of education: 20   Occupational History  . [employer]    unemployed   Social History Main Topics  . Smoking status: Former Smoker    Types: Cigarettes  . Smokeless tobacco: Never Used     Comment: Quit 1981  . Alcohol use No   Comment: Quit 2004  . Drug use: No  . Sexual activity: No   Other Topics Concern  . None   Social History Narrative   Native of [place], raised poor farming community. . To [place] '78 - finished College, all but Arts administrator. Married - wife in [place], blocked from [place] to [place]. 1 son '90; 1 dtr '93. 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 Resources depleted.   Right handed.   Caffeine Hot daily one daily.   No Known Allergies Family History  Problem Relation Age of Onset  . Diabetes Mother   . Stroke Brother   . Hearing loss Brother   . Colon cancer Neg Hx   . Rectal cancer Neg Hx   . Stomach cancer Neg Hx   . Esophageal cancer Neg Hx     Past medical history, social, surgical and family history all reviewed in electronic medical record.  No pertanent information unless stated regarding to the chief complaint.   Review of Systems: No headache, visual changes, nausea, vomiting, diarrhea, constipation, dizziness, abdominal pain, skin rash, fevers, chills, night sweats, weight loss, swollen lymph nodes, body aches, joint swelling,  chest pain, shortness of breath, mood changes.  Positive muscle aches  Objective  Blood pressure 114/74, pulse (!) 58, weight 252 lb (  114.3 kg).   Systems examined below as of 12/16/16 General: NAD A&O x3 mood, affect normal  HEENT: Pupils equal, extraocular movements intact no nystagmus Respiratory: not short of breath at rest or with speaking Cardiovascular: No lower extremity edema, non tender Skin: Warm dry intact with no signs of infection or rash on extremities or on axial skeleton. Abdomen: Soft nontender, no masses Neuro: Cranial nerves  intact, neurovascularly intact in all extremities with 2+ DTRs and 2+ pulses. Lymph: No lymphadenopathy appreciated today  Gait Mild antalgic gait but improved  MSK: Non tender with full range of motion and good stability and symmetric strength and tone of shoulders,  elbows, wrist,  hips and ankles bilaterally.  Arthritic changes of multiple joints Knee: Bilateral valgus deformity noted. Large thigh to calf ratio.  Tender to palpation over medial and PF joint line. Less tenderness than previous exam ROM full in flexion and extension and lower leg rotation. instability with valgus force.  painful patellar compression. Patellar glide with moderate crepitus. Patellar and quadriceps tendons unremarkable. Hamstring and quadriceps strength is normal.       Note: This dictation was prepared with Dragon dictation along with smaller phrase technology. Any transcriptional errors that result from this process are unintentional.

## 2016-12-16 NOTE — Patient Instructions (Signed)
Great to see you  Keep it up  Ice is your friend.  Stay active pennsaid pinkie amount topically 2 times daily as needed.  See me again in 4-6 weeks and lets make sure you are on the right path!

## 2016-12-22 DIAGNOSIS — R339 Retention of urine, unspecified: Secondary | ICD-10-CM | POA: Diagnosis not present

## 2016-12-23 DIAGNOSIS — N139 Obstructive and reflux uropathy, unspecified: Secondary | ICD-10-CM | POA: Diagnosis not present

## 2016-12-23 DIAGNOSIS — N183 Chronic kidney disease, stage 3 (moderate): Secondary | ICD-10-CM | POA: Diagnosis not present

## 2016-12-23 DIAGNOSIS — Z466 Encounter for fitting and adjustment of urinary device: Secondary | ICD-10-CM | POA: Diagnosis not present

## 2016-12-23 DIAGNOSIS — R339 Retention of urine, unspecified: Secondary | ICD-10-CM | POA: Diagnosis not present

## 2016-12-23 DIAGNOSIS — M109 Gout, unspecified: Secondary | ICD-10-CM | POA: Diagnosis not present

## 2016-12-23 DIAGNOSIS — E785 Hyperlipidemia, unspecified: Secondary | ICD-10-CM | POA: Diagnosis not present

## 2016-12-23 DIAGNOSIS — I129 Hypertensive chronic kidney disease with stage 1 through stage 4 chronic kidney disease, or unspecified chronic kidney disease: Secondary | ICD-10-CM | POA: Diagnosis not present

## 2016-12-23 DIAGNOSIS — K648 Other hemorrhoids: Secondary | ICD-10-CM | POA: Diagnosis not present

## 2016-12-23 DIAGNOSIS — N401 Enlarged prostate with lower urinary tract symptoms: Secondary | ICD-10-CM | POA: Diagnosis not present

## 2016-12-23 DIAGNOSIS — Z87891 Personal history of nicotine dependence: Secondary | ICD-10-CM | POA: Diagnosis not present

## 2016-12-23 DIAGNOSIS — D649 Anemia, unspecified: Secondary | ICD-10-CM | POA: Diagnosis not present

## 2016-12-23 DIAGNOSIS — R600 Localized edema: Secondary | ICD-10-CM | POA: Diagnosis not present

## 2016-12-23 DIAGNOSIS — M150 Primary generalized (osteo)arthritis: Secondary | ICD-10-CM | POA: Diagnosis not present

## 2016-12-23 DIAGNOSIS — K5790 Diverticulosis of intestine, part unspecified, without perforation or abscess without bleeding: Secondary | ICD-10-CM | POA: Diagnosis not present

## 2016-12-29 ENCOUNTER — Telehealth: Payer: Self-pay | Admitting: Internal Medicine

## 2016-12-29 MED ORDER — METOPROLOL TARTRATE 25 MG PO TABS: 25.0000 mg | ORAL_TABLET | Freq: Two times a day (BID) | ORAL | 2 refills | Status: DC

## 2016-12-29 NOTE — Telephone Encounter (Signed)
Reviewed chart pt is up-to-date notified pt rx sent to [place]...[name]

## 2016-12-29 NOTE — Telephone Encounter (Signed)
Pt is requesting a refill on metoprolol tartrate (LOPRESSOR) 25 MG tablet to be sent to [facility] on [facility].

## 2017-01-20 ENCOUNTER — Ambulatory Visit: Payer: Medicare Other | Admitting: Family Medicine

## 2017-01-24 ENCOUNTER — Telehealth: Payer: Self-pay | Admitting: *Deleted

## 2017-01-24 ENCOUNTER — Encounter: Payer: Self-pay | Admitting: Family Medicine

## 2017-01-24 ENCOUNTER — Ambulatory Visit (INDEPENDENT_AMBULATORY_CARE_PROVIDER_SITE_OTHER): Payer: Medicare Other | Admitting: Family Medicine

## 2017-01-24 ENCOUNTER — Ambulatory Visit (HOSPITAL_COMMUNITY)
Admission: RE | Admit: 2017-01-24 | Discharge: 2017-01-24 | Disposition: A | Discharge status: Home / Self Care | Payer: Medicare Other | Source: Ambulatory Visit | Attending: Family Medicine | Admitting: Family Medicine

## 2017-01-24 VITALS — BP 120/80 | HR 62 | Wt 244.0 lb

## 2017-01-24 DIAGNOSIS — M170 Bilateral primary osteoarthritis of knee: Secondary | ICD-10-CM

## 2017-01-24 DIAGNOSIS — R2241 Localized swelling, mass and lump, right lower limb: Secondary | ICD-10-CM | POA: Insufficient documentation

## 2017-01-24 DIAGNOSIS — M79604 Pain in right leg: Secondary | ICD-10-CM

## 2017-01-24 DIAGNOSIS — R600 Localized edema: Secondary | ICD-10-CM

## 2017-01-24 MED ORDER — FUROSEMIDE 20 MG PO TABS: 20.0000 mg | ORAL_TABLET | Freq: Every day | ORAL | 3 refills | Status: DC

## 2017-01-24 NOTE — Telephone Encounter (Signed)
[place] spoke with pharmacist. Cancelled furosemide 20mg .

## 2017-01-24 NOTE — Progress Notes (Signed)
[name] [address] [place], [place] [address] Phone: [phone] Subjective:    I'm seeing this patient by the request  of:  Biagio Borg, MD   CC: Knee pain f/u  IEP:PIRJJOACZY  [name] is a 66 y.o. male coming in with complaint of knee pain. Past medical history significant gout. Found to have more of an gouty arthropathy with the knee effusions. Patient was doing conservative therapy. Having worsening symptoms overall. Patient isright lower Extremity swelling more and 2. Patient is walking with the aid of a cane. Trying to stay active and finding it difficult.  Patient feels that the right lower cavity is also swollen. Safe in this is giving him more discomfort. No numbness. Has not changed in medication.    Past Medical History:  Diagnosis Date  . Anemia    normal Fe, nl B12, nl retic, nl EPO July '13  . Blood transfusion without reported diagnosis   . BPH (benign prostatic hyperplasia)   . Chronic back pain   . Chronic kidney disease    CKD III, obstructive nephropathy  . Diverticulosis   . Dysuria   . Elevated PSA, greater than or equal to 20 ng/ml June '13   PSA 107  . Hemorrhoids, internal, with bleeding, prolapse 09/19/2014  . Hyperlipidemia 05/29/2014  . Hypertension   . Obstructive uropathy 11/27/2015  . Renal insufficiency   . Tuberculosis    h/o PPD +  . UTI (urinary tract infection)    Past Surgical History:  Procedure Laterality Date  . CARPAL TUNNEL RELEASE Right   . COLONOSCOPY    . HEMORRHOID BANDING    . PROSTATE SURGERY    . SPLENECTOMY     Social History   Social History  . Marital status: Married    Spouse name: N/A  . Number of children: 2  . Years of education: 20   Occupational History  . [employer]    unemployed   Social History Main Topics  . Smoking status: Former Smoker    Types: Cigarettes  . Smokeless tobacco: Never Used     Comment: Quit 1981  . Alcohol use No   Comment: Quit 2004  . Drug use: No  . Sexual activity: No   Other Topics Concern  . None   Social History Narrative   Native of [place], raised poor farming community. . To [place] '78 - finished College, all but Arts administrator. Married - wife in [place], blocked from [place] to [place]. 1 son '90; 1 dtr '93. 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 Resources depleted.   Right handed.   Caffeine Hot daily one daily.   No Known Allergies Family History  Problem Relation Age of Onset  . Diabetes Mother   . Stroke Brother   . Hearing loss Brother   . Colon cancer Neg Hx   . Rectal cancer Neg Hx   . Stomach cancer Neg Hx   . Esophageal cancer Neg Hx     Past medical history, social, surgical and family history all reviewed in electronic medical record.  No pertanent information unless stated regarding to the chief complaint.   Review of Systems: No headache, visual changes, nausea, vomiting, diarrhea, constipation, dizziness, abdominal pain, skin rash, fevers, chills, night sweats, weight loss, swollen lymph nodes, body aches, joint swelling, muscle aches, chest pain, shortness of breath, mood changes.  Positive muscle aches  Objective  Blood pressure 120/80,  pulse 62, weight 244 lb (110.7 kg).   Systems examined below as of 01/24/17 General: NAD A&O x3 mood, affect normal  HEENT: Pupils equal, extraocular movements intact no nystagmus Respiratory: not short of breath at rest or with speaking Cardiovascular: Trace lower extremity edema bilaterally but worse on the right side of the left considerably, non tender Skin: Warm dry intact with no signs of infection or rash on extremities or on axial skeleton. Abdomen: Soft nontender, no masses Neuro: Cranial nerves  intact, neurovascularly intact in all extremities with 2+ DTRs and 2+ pulses. Lymph: No lymphadenopathy appreciated today  Gait Mild antalgic gait but Worsening MSK: Mild tender with full range of  motion and good stability and symmetric strength and tone of shoulders, elbows, wrist,  hips and ankles bilaterally.  Arthritic changes of multiple joints Knee: Bilateral valgus deformity noted. Large thigh to calf ratio. Trace increase in swelling from previous exam Tender to palpation over medial and PF joint line. Less tenderness than previous exam ROM full in flexion and extension and lower leg rotation. instability with valgus force.  painful patellar compression. Patellar glide with moderate crepitus. Patellar and quadriceps tendons unremarkable. Once again swelling noted of the lower extremity on the right Hamstring and quadriceps strength is normal.  After informed written and verbal consent, patient was seated on exam table. Right knee was prepped with alcohol swab and utilizing anterolateral approach, patient's right knee space was injected with 4:1  marcaine 0.5%: Kenalog 40mg /dL. Patient tolerated the procedure well without immediate complications.  After informed written and verbal consent, patient was seated on exam table. Left knee was prepped with alcohol swab and utilizing anterolateral approach, patient's left knee space was injected with 4:1  marcaine 0.5%: Kenalog 40mg /dL. Patient tolerated the procedure well without immediate complications.       Note: This dictation was prepared with Dragon dictation along with smaller phrase technology. Any transcriptional errors that result from this process are unintentional.

## 2017-01-24 NOTE — Patient Instructions (Signed)
Good to see you  Alvera Singh is your friend.  We will get test on the right leg to make sure no blood clot.  Lasix daily for 5 days then as needed for swelling.  See me again in 4-6 weeks.

## 2017-01-24 NOTE — Assessment & Plan Note (Signed)
Bilateral injections given today. Discussed icing regimen and home exercises. Discussed which activities doing which ones to avoid. Patient would be a candidate for viscous supplementation if this does not seem to help. Asian is also having some right lower extremity swelling. Mild pain with compression of the calf. Doppler ordered today to rule out any type of clot that could be contribute in. Patient denies any shortness of breath. If normal patient is having more of the lower extremity edema. Given some very mild Lasix. Warned of potential side effects. Patient has had a history of hypokalemia and we'll monitor. Patient will come back and see me again in 4 weeks.

## 2017-01-24 NOTE — Assessment & Plan Note (Signed)
Was going to attempt possible diuretic with patient's past medical history concern for electrolyte disturbances as well as potentially worsening kidney failure. Patient will hold on this medication. Because of the asymmetry patient will be sent for a Doppler of the right lower extremity to make sure that there is no blood clot that can be contribute in. We discussed icing regimen, compression, follow-up again in 4 weeks

## 2017-01-25 DIAGNOSIS — R972 Elevated prostate specific antigen [PSA]: Secondary | ICD-10-CM | POA: Diagnosis not present

## 2017-01-26 DIAGNOSIS — D631 Anemia in chronic kidney disease: Secondary | ICD-10-CM | POA: Diagnosis not present

## 2017-01-26 DIAGNOSIS — N184 Chronic kidney disease, stage 4 (severe): Secondary | ICD-10-CM | POA: Diagnosis not present

## 2017-01-26 DIAGNOSIS — N2581 Secondary hyperparathyroidism of renal origin: Secondary | ICD-10-CM | POA: Diagnosis not present

## 2017-02-19 ENCOUNTER — Encounter (HOSPITAL_COMMUNITY): Payer: Self-pay | Admitting: Emergency Medicine

## 2017-02-19 ENCOUNTER — Emergency Department (HOSPITAL_COMMUNITY)
Admission: EM | Admit: 2017-02-19 | Discharge: 2017-02-20 | Disposition: A | Discharge status: Home / Self Care | Payer: Medicare Other | Attending: Emergency Medicine | Admitting: Emergency Medicine

## 2017-02-19 DIAGNOSIS — N3001 Acute cystitis with hematuria: Secondary | ICD-10-CM | POA: Insufficient documentation

## 2017-02-19 DIAGNOSIS — I129 Hypertensive chronic kidney disease with stage 1 through stage 4 chronic kidney disease, or unspecified chronic kidney disease: Secondary | ICD-10-CM | POA: Diagnosis not present

## 2017-02-19 DIAGNOSIS — Z87891 Personal history of nicotine dependence: Secondary | ICD-10-CM | POA: Insufficient documentation

## 2017-02-19 DIAGNOSIS — Z79899 Other long term (current) drug therapy: Secondary | ICD-10-CM | POA: Diagnosis not present

## 2017-02-19 DIAGNOSIS — N183 Chronic kidney disease, stage 3 (moderate): Secondary | ICD-10-CM | POA: Diagnosis not present

## 2017-02-19 DIAGNOSIS — R339 Retention of urine, unspecified: Secondary | ICD-10-CM | POA: Diagnosis present

## 2017-02-19 DIAGNOSIS — Y829 Unspecified medical devices associated with adverse incidents: Secondary | ICD-10-CM | POA: Insufficient documentation

## 2017-02-19 DIAGNOSIS — T83098A Other mechanical complication of other indwelling urethral catheter, initial encounter: Secondary | ICD-10-CM | POA: Insufficient documentation

## 2017-02-19 DIAGNOSIS — T839XXA Unspecified complication of genitourinary prosthetic device, implant and graft, initial encounter: Secondary | ICD-10-CM

## 2017-02-19 LAB — URINALYSIS, ROUTINE W REFLEX MICROSCOPIC
BILIRUBIN URINE: NEGATIVE
Glucose, UA: NEGATIVE mg/dL
Ketones, ur: NEGATIVE mg/dL
NITRITE: NEGATIVE
Protein, ur: 100 mg/dL — AB
SPECIFIC GRAVITY, URINE: 1.012 (ref 1.005–1.030)
pH: 8 (ref 5.0–8.0)

## 2017-02-19 MED ORDER — CIPROFLOXACIN HCL 250 MG PO TABS: 250.0000 mg | ORAL_TABLET | Freq: Two times a day (BID) | ORAL | 0 refills | Status: AC

## 2017-02-19 NOTE — ED Triage Notes (Signed)
Patient complaining of having problems with urinating. Patient has had a urinary catheter in for three weeks. Patient stated he did not have any urine in the bag so he flushed it. He still did not get anything so he took it out to see if he could urinate on his on. Patient states it has been over 6 hours since he has urinated.

## 2017-02-19 NOTE — ED Provider Notes (Signed)
[facility] DEPT Provider Note   CSN: [id number] Arrival date & time: 02/19/17  2033     History   Chief Complaint Chief Complaint  Patient presents with  . Urinary Retention    HPI Avinash Maltos is a 66 y.o. male w PMHx BPH w chronic indwelling foley catheter, presenting to ED with acute urinary retention 2/t foley catheter dysfunction. Patient states this morning he noticed his urine bag did not have any urine in it. At that time he attempted to flush his catheter, however was unsuccessful. He states he then removed his catheter and noticed the end of the tubing had sediment in it and was firm. Reports he has been unable to urinate since that time. Reports associated lower abdominal discomfort. Denies fever, chills, nausea, vomiting, flank pain or any other complaints today. Patient is seen by [facility].  The history is provided by the patient.    Past Medical History:  Diagnosis Date  . Anemia    normal Fe, nl B12, nl retic, nl EPO July '13  . Blood transfusion without reported diagnosis   . BPH (benign prostatic hyperplasia)   . Chronic back pain   . Chronic kidney disease    CKD III, obstructive nephropathy  . Diverticulosis   . Dysuria   . Elevated PSA, greater than or equal to 20 ng/ml June '13   PSA 107  . Hemorrhoids, internal, with bleeding, prolapse 09/19/2014  . Hyperlipidemia 05/29/2014  . Hypertension   . Obstructive uropathy 11/27/2015  . Renal insufficiency   . Tuberculosis    h/o PPD +  . UTI (urinary tract infection)     Patient Active Problem List   Diagnosis Date Noted  . Acute gouty arthritis 12/01/2016  . Degenerative arthritis of knee, bilateral 11/18/2016  . Urinary retention due to benign prostatic hyperplasia 10/24/2016  . Mass of right side of neck 10/20/2016  . Gout 10/20/2016  . Knee pain, acute 10/12/2016  . Hypokalemia 05/28/2016  . Encounter for well adult exam with abnormal findings 11/27/2015  . Obstructive uropathy  11/27/2015  . Elevated PSA 11/27/2015  . Acute pyelonephritis 05/27/2015  . Generalized bloating 05/27/2015  . Constipation 05/27/2015  . Chest pain 05/27/2015  . AKI (acute kidney injury) ([place]) 05/27/2015  . Acute sinus infection 11/22/2014  . Cough 09/25/2014  . Hemorrhoids, internal, with bleeding, prolapse 09/19/2014  . 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 Hyperlipidemia 05/29/2014  . Lumbar radiculopathy 05/23/2014  . Lumbar stenosis 11/20/2013  . Abnormal glucose 11/06/2013  . Unspecified hereditary and idiopathic peripheral neuropathy 01/22/2013  . 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 CKD (chronic kidney disease) stage 3, GFR 30-59 ml/min 11/24/2011  . Hematuria 11/24/2011  . Hydronephrosis 11/24/2011  . Anemia 11/24/2011  . BRADYCARDIA 02/05/2010  . TINEA PEDIS 01/29/2010  . 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 PERIPHERAL EDEMA 01/29/2010  . ABNORMAL ELECTROCARDIOGRAM 01/29/2010  . 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  . Osteoarthrosis, generalized, 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  . ONYCHOMYCOSIS, TOENAILS 10/02/2007  . BPH (benign prostatic hyperplasia) 10/02/2007  . Essential hypertension 03/06/2007    Past Surgical History:  Procedure Laterality Date  . CARPAL TUNNEL RELEASE Right   . COLONOSCOPY    . HEMORRHOID BANDING    . PROSTATE SURGERY    . SPLENECTOMY         Home Medications    Prior to Admission medications   Medication Sig Start Date End Date Taking? Authorizing Provider  acetaminophen (TYLENOL) 325 MG tablet Take 2 tablets (650  mg total) by mouth every 6 (six) hours as needed. 05/23/15  Yes Varney Biles, MD  allopurinol (ZYLOPRIM) 100 MG tablet Take 1 tablet (100 mg total) by mouth daily. 10/20/16  Yes Biagio Borg, MD  amLODipine (NORVASC) 5 MG tablet Take 1 tablet (5 mg total) by mouth daily. 10/12/16  Yes Plotnikov, Evie Lacks, MD  aspirin 81 MG tablet Take 81 mg by mouth daily.   Yes [provider]  atorvastatin (LIPITOR)  40 MG tablet Take 1 tablet (40 mg total) by mouth daily. Patient taking differently: Take 40 mg by mouth daily as needed [facility] he feels is needed).  10/20/16  Yes Biagio Borg, MD  metoprolol tartrate (LOPRESSOR) 25 MG tablet Take 1 tablet (25 mg total) by mouth 2 (two) times daily. 12/29/16  Yes Biagio Borg, MD  oxybutynin (DITROPAN) 5 MG tablet Take 1 tablet (5 mg total) by mouth daily. Patient taking differently: Take 5 mg by mouth daily as needed for bladder spasms.  05/29/14  Yes Biagio Borg, MD  oxyCODONE-acetaminophen (PERCOCET/ROXICET) 5-325 MG tablet Take 2 tablets by mouth every 4 (four) hours as needed for severe pain. 12/01/16  Yes Biagio Borg, MD  Acetaminophen-Codeine (TYLENOL/CODEINE #3) 300-30 MG tablet Take 0.5-1 tablets by mouth every 6 (six) hours as needed for pain. Patient not taking: Reported on 02/19/2017 10/12/16   Plotnikov, Evie Lacks, MD  ciprofloxacin (CIPRO) 250 MG tablet Take 1 tablet (250 mg total) by mouth 2 (two) times daily. 02/19/17 02/22/17  Russo, [place] N, PA-C  colchicine 0.6 MG tablet Take 1 tablet (0.6 mg total) by mouth daily. Patient not taking: Reported on 02/19/2017 12/10/16   Biagio Borg, MD  predniSONE (DELTASONE) 10 MG tablet 4 tabs by mouth x3day,3tab x 3day,2tab x 3day,1tab x 3day Patient not taking: Reported on 02/19/2017 12/01/16   Biagio Borg, MD    Family History Family History  Problem Relation Age of Onset  . Diabetes Mother   . Stroke Brother   . Hearing loss Brother   . Colon cancer Neg Hx   . Rectal cancer Neg Hx   . Stomach cancer Neg Hx   . Esophageal cancer Neg Hx     Social History Social History  Substance Use Topics  . Smoking status: Former Smoker    Types: Cigarettes  . Smokeless tobacco: Never Used     Comment: Quit 1981  . Alcohol use No     Comment: Quit 2004     Allergies   Patient has no known allergies.   Review of Systems Review of Systems  Constitutional: Negative for chills and fever.    Gastrointestinal: Negative for nausea and vomiting.  Genitourinary: Positive for difficulty urinating. Negative for flank pain.  All other systems reviewed and are negative.    Physical Exam Updated Vital Signs BP (!) 143/97 (BP Location: Left Arm)   Pulse 61   Temp 98.2 F (36.8 C) (Oral)   Resp 19   Ht 6\' 1"  (1.854 m)   Wt 113.4 kg (250 lb)   SpO2 98%   BMI 32.98 kg/m   Physical Exam  Constitutional: He appears well-developed and well-nourished. No distress.  HENT:  Head: Normocephalic and atraumatic.  Eyes: Conjunctivae are normal.  Cardiovascular: Normal rate.   Pulmonary/Chest: Effort normal.  Abdominal: Soft. Bowel sounds are normal. There is no rebound and no guarding.  Abdomen with old midline surgical scar, mildly distended with suprapubic tenderness. No CVA tenderness.  Neurological: He  is alert.  Skin: Skin is warm.  Psychiatric: He has a normal mood and affect. His behavior is normal.  Nursing note and vitals reviewed.    ED Treatments / Results  Labs (all labs ordered are listed, but only abnormal results are displayed) Labs Reviewed  URINALYSIS, ROUTINE W REFLEX MICROSCOPIC - Abnormal; Notable for the following:       Result Value   APPearance CLOUDY (*)    Hgb urine dipstick SMALL (*)    Protein, ur 100 (*)    Leukocytes, UA LARGE (*)    Bacteria, UA RARE (*)    Squamous Epithelial / LPF 0-5 (*)    Non Squamous Epithelial 0-5 (*)    Crystals PRESENT (*)    All other components within normal limits  URINE CULTURE    EKG  EKG Interpretation None       Radiology No results found.  Procedures Procedures (including critical care time)  Medications Ordered in ED Medications - No data to display   Initial Impression / Assessment and Plan / ED Course  I have reviewed the triage vital signs and the nursing notes.  Pertinent labs & imaging results that were available during my care of the patient were reviewed by me and considered in my  medical decision making (see chart for details).      Pt presenting with urinary retention secondary to Foley catheter dysfunction. Bladder scan showing 497 mL. Foley catheter replaced. UA showing signs of infection. Will treat with ciprofloxacin, given recent urine culture in March 2018 showing resistance to keflex and macrobid. Given pt's renal function, Bactrim is also not recommended. He is afebrile, without nausea or flank pain. Patient is safe for discharge with urology of follow-up as needed.   Patient discussed with and seen by Dr. Tamera Punt.  Discussed results, findings, treatment and follow up. Patient advised of return precautions. Patient verbalized understanding and agreed with plan.   Final Clinical Impressions(s) / ED Diagnoses   Final diagnoses:  Complication of Foley catheter, initial encounter ([place])  Acute cystitis with hematuria    New Prescriptions New Prescriptions   CIPROFLOXACIN (CIPRO) 250 MG TABLET    Take 1 tablet (250 mg total) by mouth 2 (two) times daily.     Russo, [place] N, PA-C 02/19/17 2350    Malvin Johns, MD 02/19/17 [id number]

## 2017-02-19 NOTE — Discharge Instructions (Signed)
Please read instructions below. Take your antibiotic, Ciprofloxacin, as directed until it is gone. Drink plenty of water. Schedule an appointment with your Urologist for follow up. Return to the ER if you develop a fever, have nausea, back pain, or new or concerning symptoms.

## 2017-02-22 LAB — URINE CULTURE

## 2017-03-01 ENCOUNTER — Encounter: Payer: Self-pay | Admitting: Internal Medicine

## 2017-03-01 ENCOUNTER — Ambulatory Visit (INDEPENDENT_AMBULATORY_CARE_PROVIDER_SITE_OTHER): Payer: Medicare Other | Admitting: Internal Medicine

## 2017-03-01 VITALS — BP 122/84 | HR 71 | Temp 97.9°F | Ht 73.0 in | Wt 237.0 lb

## 2017-03-01 DIAGNOSIS — I10 Essential (primary) hypertension: Secondary | ICD-10-CM

## 2017-03-01 DIAGNOSIS — E785 Hyperlipidemia, unspecified: Secondary | ICD-10-CM

## 2017-03-01 DIAGNOSIS — Z23 Encounter for immunization: Secondary | ICD-10-CM | POA: Diagnosis not present

## 2017-03-01 DIAGNOSIS — N183 Chronic kidney disease, stage 3 unspecified: Secondary | ICD-10-CM

## 2017-03-01 DIAGNOSIS — Z0000 Encounter for general adult medical examination without abnormal findings: Secondary | ICD-10-CM | POA: Diagnosis not present

## 2017-03-01 NOTE — Progress Notes (Signed)
Subjective:    Patient ID: [name], male    DOB: [birth year], 66 y.o.   MRN: [id number]  HPI Here to f/u; overall doing ok,  Pt denies chest pain, increasing sob or doe, wheezing, orthopnea, PND, increased LE swelling, palpitations, dizziness or syncope.  Pt denies new neurological symptoms such as new headache, or facial or extremity weakness or numbness.  Pt denies polydipsia, polyuria, or low sugar episode.  Pt states overall good compliance with meds, mostly trying to follow appropriate diet, with wt overall stable,  but little exercise however.Walks 3 miles daily Wt Readings from Last 3 Encounters:  03/01/17 237 lb (107.5 kg)  02/19/17 250 lb (113.4 kg)  01/24/17 244 lb (110.7 kg)  Has just seen nephrology last month, declines further labs today Past Medical History:  Diagnosis Date  . Anemia    normal Fe, nl B12, nl retic, nl EPO July '13  . Blood transfusion without reported diagnosis   . BPH (benign prostatic hyperplasia)   . Chronic back pain   . Chronic kidney disease    CKD III, obstructive nephropathy  . Diverticulosis   . Dysuria   . Elevated PSA, greater than or equal to 20 ng/ml June '13   PSA 107  . Hemorrhoids, internal, with bleeding, prolapse 09/19/2014  . Hyperlipidemia 05/29/2014  . Hypertension   . Obstructive uropathy 11/27/2015  . Renal insufficiency   . Tuberculosis    h/o PPD +  . UTI (urinary tract infection)    Past Surgical History:  Procedure Laterality Date  . CARPAL TUNNEL RELEASE Right   . COLONOSCOPY    . HEMORRHOID BANDING    . PROSTATE SURGERY    . SPLENECTOMY      reports that he has quit smoking. His smoking use included Cigarettes. He has never used smokeless tobacco. He reports that he does not drink alcohol or use drugs. family history includes Diabetes in his mother; Hearing loss in his brother; Stroke in his brother. No Known Allergies Current Outpatient Prescriptions on File Prior to Visit  Medication Sig Dispense Refill  .  acetaminophen (TYLENOL) 325 MG tablet Take 2 tablets (650 mg total) by mouth every 6 (six) hours as needed. 12 tablet 0  . Acetaminophen-Codeine (TYLENOL/CODEINE #3) 300-30 MG tablet Take 0.5-1 tablets by mouth every 6 (six) hours as needed for pain. 30 tablet 0  . allopurinol (ZYLOPRIM) 100 MG tablet Take 1 tablet (100 mg total) by mouth daily. 90 tablet 3  . amLODipine (NORVASC) 5 MG tablet Take 1 tablet (5 mg total) by mouth daily. 90 tablet 2  . aspirin 81 MG tablet Take 81 mg by mouth daily.    Marland Kitchen atorvastatin (LIPITOR) 40 MG tablet Take 1 tablet (40 mg total) by mouth daily. (Patient taking differently: Take 40 mg by mouth daily as needed [facility] he feels is needed). ) 90 tablet 3  . colchicine 0.6 MG tablet Take 1 tablet (0.6 mg total) by mouth daily. 30 tablet 11  . metoprolol tartrate (LOPRESSOR) 25 MG tablet Take 1 tablet (25 mg total) by mouth 2 (two) times daily. 180 tablet 2  . oxybutynin (DITROPAN) 5 MG tablet Take 1 tablet (5 mg total) by mouth daily. (Patient taking differently: Take 5 mg by mouth daily as needed for bladder spasms. ) 90 tablet 3  . oxyCODONE-acetaminophen (PERCOCET/ROXICET) 5-325 MG tablet Take 2 tablets by mouth every 4 (four) hours as needed for severe pain. 20 tablet 0   No current facility-administered medications  on file prior to visit.    Review of Systems  Constitutional: Negative for other unusual diaphoresis or sweats HENT: Negative for ear discharge or swelling Eyes: Negative for other worsening visual disturbances Respiratory: Negative for stridor or other swelling  Gastrointestinal: Negative for worsening distension or other blood Genitourinary: Negative for retention or other urinary change Musculoskeletal: Negative for other MSK pain or swelling Skin: Negative for color change or other new lesions Neurological: Negative for worsening tremors and other numbness  Psychiatric/Behavioral: Negative for worsening agitation or other fatigue All other  system neg per pt    Objective:   Physical Exam BP 122/84   Pulse 71   Temp 97.9 F (36.6 C) (Oral)   Ht 6\' 1"  (1.854 m)   Wt 237 lb (107.5 kg)   SpO2 99%   BMI 31.27 kg/m  VS noted,  Constitutional: Pt appears in NAD HENT: Head: NCAT.  Right Ear: External ear normal.  Left Ear: External ear normal.  Eyes: . Pupils are equal, round, and reactive to light. Conjunctivae and EOM are normal Nose: without d/c or deformity Neck: Neck supple. Gross normal ROM Cardiovascular: Normal rate and regular rhythm.   Pulmonary/Chest: Effort normal and breath sounds without rales or wheezing.  Neurological: Pt is alert. At baseline orientation, motor grossly intact Skin: Skin is warm. No rashes, other new lesions, no LE edema Psychiatric: Pt behavior is normal without agitation  No other exam findings Lab Results  Component Value Date   WBC 7.7 11/27/2016   HGB 12.3 (L) 11/27/2016   HCT 37.5 (L) 11/27/2016   PLT 206 11/27/2016   GLUCOSE 117 (H) 11/27/2016   CHOL 127 10/20/2016   TRIG 43.0 10/20/2016   HDL 55.00 10/20/2016   LDLCALC 64 10/20/2016   ALT 17 11/10/2016   AST 16 11/10/2016   NA 138 11/27/2016   K 3.4 (L) 11/27/2016   CL 102 11/27/2016   CREATININE 2.21 (H) 11/27/2016   BUN 24 (H) 11/27/2016   CO2 29 11/27/2016   TSH 2.35 10/20/2016   PSA 45.50 (H) 10/20/2016   INR 1.08 05/12/2012   HGBA1C 5.8 11/27/2015      Assessment & Plan:

## 2017-03-01 NOTE — Assessment & Plan Note (Signed)
Lab Results  Component Value Date   LDLCALC 64 10/20/2016  stable overall by history and exam, recent data reviewed with pt, and pt to continue medical treatment as before,  to f/u any worsening symptoms or concerns

## 2017-03-01 NOTE — Assessment & Plan Note (Signed)
stable overall by history and exam, recent data reviewed with pt, and pt to continue medical treatment as before,  to f/u any worsening symptoms or concerns  

## 2017-03-01 NOTE — Patient Instructions (Signed)
Please continue all other medications as before, and refills have been done if requested.  Please have the pharmacy call with any other refills you may need.  Please continue your efforts at being more active, low cholesterol diet, and weight control..  Please keep your appointments with your specialists as you may have planned  Please return in 6 months, or sooner if needed, with Lab testing done 3-5 days before  

## 2017-03-07 ENCOUNTER — Ambulatory Visit (INDEPENDENT_AMBULATORY_CARE_PROVIDER_SITE_OTHER): Payer: Medicare Other | Admitting: Family Medicine

## 2017-03-07 ENCOUNTER — Encounter: Payer: Self-pay | Admitting: Family Medicine

## 2017-03-07 DIAGNOSIS — M170 Bilateral primary osteoarthritis of knee: Secondary | ICD-10-CM | POA: Diagnosis not present

## 2017-03-07 NOTE — Assessment & Plan Note (Signed)
Patient was given an injection. Tolerated the procedure well. We discussed icing regimen and home exercises. We discussed which activities doing which ones to avoid. We'll see patient doesn't follow up in 4-6 weeks. We once again discussed the possibility for patient having poor blood flow to the legs and needing further workup. Patient declined at this time.

## 2017-03-07 NOTE — Progress Notes (Signed)
[name] [place], [place] [address] Phone: [phone] Subjective:    I'm seeing this patient by the request  of:    CC: Bilateral leg pain  OIZ:[id number]  [name] is a 66 y.o. male coming in with complaint of bilateral leg pain. Patient does have many different comorbidities. Found to have severe osteophytic changes of the knees. Was given steroid injection 6 weeks ago. Mild pain and worsening pain again. Patient states that it continues to have to walk with the aid of a cane. Patient was supposed to get the Dopplers an ABI which patient declined and still does not want to do those testings at this time. Patient feels that some of it is coming from the knees.      Past Medical History:  Diagnosis Date  . Anemia    normal Fe, nl B12, nl retic, nl EPO July '13  . Blood transfusion without reported diagnosis   . BPH (benign prostatic hyperplasia)   . Chronic back pain   . Chronic kidney disease    CKD III, obstructive nephropathy  . Diverticulosis   . Dysuria   . Elevated PSA, greater than or equal to 20 ng/ml June '13   PSA 107  . Hemorrhoids, internal, with bleeding, prolapse 09/19/2014  . Hyperlipidemia 05/29/2014  . Hypertension   . Obstructive uropathy 11/27/2015  . Renal insufficiency   . Tuberculosis    h/o PPD +  . UTI (urinary tract infection)    Past Surgical History:  Procedure Laterality Date  . CARPAL TUNNEL RELEASE Right   . COLONOSCOPY    . HEMORRHOID BANDING    . PROSTATE SURGERY    . SPLENECTOMY     Social History   Social History  . Marital status: Married    Spouse name: N/A  . Number of children: 2  . Years of education: 20   Occupational History  . [place]    unemployed   Social History Main Topics  . Smoking status: Former Smoker    Types: Cigarettes  . Smokeless tobacco: Never Used     Comment: Quit 1981  . Alcohol use No     Comment: Quit 2004  . Drug use: No    . Sexual activity: No   Other Topics Concern  . None   Social History Narrative   Native of [place], raised poor farming community. . To [place] '78 - finished College, all but Arts administrator. Married - wife in [place], blocked from [place] to [place]. 1 son '90; 1 dtr '93. 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 Resources depleted.   Right handed.   Caffeine Hot daily one daily.   No Known Allergies Family History  Problem Relation Age of Onset  . Diabetes Mother   . Stroke Brother   . Hearing loss Brother   . Colon cancer Neg Hx   . Rectal cancer Neg Hx   . Stomach cancer Neg Hx   . Esophageal cancer Neg Hx      Past medical history, social, surgical and family history all reviewed in electronic medical record.  No pertanent information unless stated regarding to the chief complaint.   Review of Systems:Review of systems updated and as accurate as of 03/07/17  No headache, visual changes, nausea, vomiting, diarrhea, constipation, dizziness, abdominal pain, skin rash, fevers, chills, night sweats, weight loss, swollen lymph nodes, body aches, joint swelling, muscle aches, chest pain,  shortness of breath, mood changes. Positive muscle aches  Objective  Blood pressure 130/80, pulse 64, height 6\' 1"  (1.854 m), weight 237 lb (107.5 kg), SpO2 97 %. Systems examined below as of 03/07/17   General: No apparent distress alert and oriented x3 mood and affect normal, dressed appropriately.  HEENT: Pupils equal, extraocular movements intact  Respiratory: Patient's speak in full sentences and does not appear short of breath  Cardiovascular: No lower extremity edema, non tender, no erythema  Skin: Warm dry intact with no signs of infection or rash on extremities or on axial skeleton.  Abdomen: Soft nontender  Neuro: Cranial nerves II through XII are intact, neurovascularly intact in all extremities with 2+ DTRs and 2+ pulses.  Lymph: No lymphadenopathy of  posterior or anterior cervical chain or axillae bilaterally.  Gait Antalgic gait MSK:  Non tender with full range of motion and good stability and symmetric strength and tone of shoulders, elbows, wrist, hip, and ankles bilaterally. Severe arthritic changes of multiple joints  Knee: Bilateral valgus deformity noted. Large thigh to calf ratio. Trace effusion noted Tender to palpation over medial and PF joint line.  ROM full in flexion and extension and lower leg rotation. instability with valgus force.  painful patellar compression. Patellar glide with moderate crepitus. Patellar and quadriceps tendons unremarkable. Hamstring and quadriceps strength is normal.  After informed written and verbal consent, patient was seated on exam table. Right knee was prepped with alcohol swab and utilizing anterolateral approach, patient's right knee space was injected with a prefilled monovisc 22 mg/mL. Patient tolerated the procedure well without immediate complications.  After informed written and verbal consent, patient was seated on exam table. Left knee was prepped with alcohol swab and utilizing anterolateral approach, patient's left knee space was injected with monovisc injection (22mg /mL)  Patient tolerated the procedure well without immediate complications.    Impression and Recommendations:     This case required medical decision making of moderate complexity.      Note: This dictation was prepared with Dragon dictation along with smaller phrase technology. Any transcriptional errors that result from this process are unintentional.

## 2017-03-07 NOTE — Patient Instructions (Addendum)
Good to see you  If you are not better in 2 weeks you need to call me and we will need to check the blood flow in the legs.  Ice is your friend Will take about 2 weeks to notice benefits in the knee.  Keep doing all the other vitamins and medicines See me again in 4 weeks to make sure you are improving.

## 2017-03-08 ENCOUNTER — Encounter (HOSPITAL_COMMUNITY): Payer: Self-pay | Admitting: Internal Medicine

## 2017-03-08 ENCOUNTER — Emergency Department (HOSPITAL_COMMUNITY)
Admission: EM | Admit: 2017-03-08 | Discharge: 2017-03-08 | Disposition: A | Discharge status: Home / Self Care | Payer: Medicare Other | Attending: Emergency Medicine | Admitting: Emergency Medicine

## 2017-03-08 DIAGNOSIS — R338 Other retention of urine: Secondary | ICD-10-CM | POA: Insufficient documentation

## 2017-03-08 DIAGNOSIS — N183 Chronic kidney disease, stage 3 (moderate): Secondary | ICD-10-CM | POA: Insufficient documentation

## 2017-03-08 DIAGNOSIS — Z7982 Long term (current) use of aspirin: Secondary | ICD-10-CM | POA: Insufficient documentation

## 2017-03-08 DIAGNOSIS — T839XXA Unspecified complication of genitourinary prosthetic device, implant and graft, initial encounter: Secondary | ICD-10-CM | POA: Insufficient documentation

## 2017-03-08 DIAGNOSIS — T83098A Other mechanical complication of other indwelling urethral catheter, initial encounter: Secondary | ICD-10-CM | POA: Diagnosis not present

## 2017-03-08 DIAGNOSIS — Z79899 Other long term (current) drug therapy: Secondary | ICD-10-CM | POA: Diagnosis not present

## 2017-03-08 DIAGNOSIS — Z87891 Personal history of nicotine dependence: Secondary | ICD-10-CM | POA: Diagnosis not present

## 2017-03-08 DIAGNOSIS — R339 Retention of urine, unspecified: Secondary | ICD-10-CM | POA: Diagnosis not present

## 2017-03-08 DIAGNOSIS — Y828 Other medical devices associated with adverse incidents: Secondary | ICD-10-CM | POA: Insufficient documentation

## 2017-03-08 DIAGNOSIS — I129 Hypertensive chronic kidney disease with stage 1 through stage 4 chronic kidney disease, or unspecified chronic kidney disease: Secondary | ICD-10-CM | POA: Diagnosis not present

## 2017-03-08 DIAGNOSIS — Y658 Other specified misadventures during surgical and medical care: Secondary | ICD-10-CM | POA: Insufficient documentation

## 2017-03-08 LAB — URINALYSIS, ROUTINE W REFLEX MICROSCOPIC
Bilirubin Urine: NEGATIVE
Glucose, UA: NEGATIVE mg/dL
KETONES UR: NEGATIVE mg/dL
Nitrite: POSITIVE — AB
PH: 8 (ref 5.0–8.0)
Protein, ur: 30 mg/dL — AB
Specific Gravity, Urine: 1.01 (ref 1.005–1.030)
Squamous Epithelial / LPF: NONE SEEN

## 2017-03-08 NOTE — ED Provider Notes (Signed)
[facility] DEPT Provider Note   CSN: [id number] Arrival date & time: 03/08/17  1520     History   Chief Complaint Chief Complaint  Patient presents with  . urinary catheter issues  . Urinary Retention    HPI Savior Himebaugh is a 66 y.o. male.  HPI Patient presents emergency department complaining of acute urinary retention.  He has a chronic indwelling Foley catheter.  He states this stopped draining this morning.  He's had no output since then.  He feels swelling in his lower abdomen.  Denies fevers chills.  Denies nausea and vomiting.  No upper abdominal pain.  No back pain.  Symptoms are mild in severity   Past Medical History:  Diagnosis Date  . Anemia    normal Fe, nl B12, nl retic, nl EPO July '13  . Blood transfusion without reported diagnosis   . BPH (benign prostatic hyperplasia)   . Chronic back pain   . Chronic kidney disease    CKD III, obstructive nephropathy  . Diverticulosis   . Dysuria   . Elevated PSA, greater than or equal to 20 ng/ml June '13   PSA 107  . Hemorrhoids, internal, with bleeding, prolapse 09/19/2014  . Hyperlipidemia 05/29/2014  . Hypertension   . Obstructive uropathy 11/27/2015  . Renal insufficiency   . Tuberculosis    h/o PPD +  . UTI (urinary tract infection)     Patient Active Problem List   Diagnosis Date Noted  . Acute gouty arthritis 12/01/2016  . Degenerative arthritis of knee, bilateral 11/18/2016  . Urinary retention due to benign prostatic hyperplasia 10/24/2016  . Mass of right side of neck 10/20/2016  . Gout 10/20/2016  . Knee pain, acute 10/12/2016  . Hypokalemia 05/28/2016  . Encounter for well adult exam with abnormal findings 11/27/2015  . Obstructive uropathy 11/27/2015  . Elevated PSA 11/27/2015  . Acute pyelonephritis 05/27/2015  . Generalized bloating 05/27/2015  . Constipation 05/27/2015  . Chest pain 05/27/2015  . AKI (acute kidney injury) ([facility]) 05/27/2015  . Acute sinus infection 11/22/2014  .  Cough 09/25/2014  . Hemorrhoids, internal, with bleeding, prolapse 09/19/2014  . 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 Hyperlipidemia 05/29/2014  . Lumbar radiculopathy 05/23/2014  . Lumbar stenosis 11/20/2013  . Abnormal glucose 11/06/2013  . Unspecified hereditary and idiopathic peripheral neuropathy 01/22/2013  . 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 CKD (chronic kidney disease) stage 3, GFR 30-59 ml/min (H[facility]) 11/24/2011  . Hematuria 11/24/2011  . Hydronephrosis 11/24/2011  . Anemia 11/24/2011  . BRADYCARDIA 02/05/2010  . TINEA PEDIS 01/29/2010  . 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 PERIPHERAL EDEMA 01/29/2010  . ABNORMAL ELECTROCARDIOGRAM 01/29/2010  . 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  . Osteoarthrosis, generalized, 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  . ONYCHOMYCOSIS, TOENAILS 10/02/2007  . BPH (benign prostatic hyperplasia) 10/02/2007  . Essential hypertension 03/06/2007    Past Surgical History:  Procedure Laterality Date  . CARPAL TUNNEL RELEASE Right   . COLONOSCOPY    . HEMORRHOID BANDING    . PROSTATE SURGERY    . SPLENECTOMY         Home Medications    Prior to Admission medications   Medication Sig Start Date End Date Taking? Authorizing Provider  acetaminophen (TYLENOL) 325 MG tablet Take 2 tablets (650 mg total) by mouth every 6 (six) hours as needed. 05/23/15   Varney Biles, MD  Acetaminophen-Codeine (TYLENOL/CODEINE #3) 300-30 MG tablet Take 0.5-1 tablets by mouth every 6 (six) hours as needed for pain. 10/12/16  Plotnikov, Evie Lacks, MD  allopurinol (ZYLOPRIM) 100 MG tablet Take 1 tablet (100 mg total) by mouth daily. 10/20/16   Biagio Borg, MD  amLODipine (NORVASC) 5 MG tablet Take 1 tablet (5 mg total) by mouth daily. 10/12/16   Plotnikov, Evie Lacks, MD  aspirin 81 MG tablet Take 81 mg by mouth daily.    [provider]  atorvastatin (LIPITOR) 40 MG tablet Take 1 tablet (40 mg total) by mouth daily. Patient taking differently: Take 40 mg  by mouth daily as needed [facility] he feels is needed).  10/20/16   Biagio Borg, MD  colchicine 0.6 MG tablet Take 1 tablet (0.6 mg total) by mouth daily. 12/10/16   Biagio Borg, MD  metoprolol tartrate (LOPRESSOR) 25 MG tablet Take 1 tablet (25 mg total) by mouth 2 (two) times daily. 12/29/16   Biagio Borg, MD  oxybutynin (DITROPAN) 5 MG tablet Take 1 tablet (5 mg total) by mouth daily. Patient taking differently: Take 5 mg by mouth daily as needed for bladder spasms.  05/29/14   Biagio Borg, MD  oxyCODONE-acetaminophen (PERCOCET/ROXICET) 5-325 MG tablet Take 2 tablets by mouth every 4 (four) hours as needed for severe pain. 12/01/16   Biagio Borg, MD    Family History Family History  Problem Relation Age of Onset  . Diabetes Mother   . Stroke Brother   . Hearing loss Brother   . Colon cancer Neg Hx   . Rectal cancer Neg Hx   . Stomach cancer Neg Hx   . Esophageal cancer Neg Hx     Social History Social History  Substance Use Topics  . Smoking status: Former Smoker    Types: Cigarettes  . Smokeless tobacco: Never Used     Comment: Quit 1981  . Alcohol use No     Comment: Quit 2004     Allergies   Patient has no known allergies.   Review of Systems Review of Systems  All other systems reviewed and are negative.    Physical Exam Updated Vital Signs BP 123/86 (BP Location: Right Arm)   Pulse 75   Temp 97.9 F (36.6 C) (Oral)   Wt 107.5 kg (237 lb)   SpO2 98%   BMI 31.27 kg/m   Physical Exam  Constitutional: He is oriented to person, place, and time. He appears well-developed and well-nourished.  HENT:  Head: Normocephalic.  Eyes: EOM are normal.  Neck: Normal range of motion.  Cardiovascular: Normal rate.   Pulmonary/Chest: Effort normal.  Abdominal: He exhibits no distension.  Musculoskeletal: Normal range of motion.  Neurological: He is alert and oriented to person, place, and time.  Psychiatric: He has a normal mood and affect.  Nursing note and  vitals reviewed.    ED Treatments / Results  Labs (all labs ordered are listed, but only abnormal results are displayed) Labs Reviewed  URINE CULTURE  URINALYSIS, ROUTINE W REFLEX MICROSCOPIC    EKG  EKG Interpretation None       Radiology No results found.  Procedures Procedures (including critical care time)  Medications Ordered in ED Medications - No data to display   Initial Impression / Assessment and Plan / ED Course  I have reviewed the triage vital signs and the nursing notes.  Pertinent labs & imaging results that were available during my care of the patient were reviewed by me and considered in my medical decision making (see chart for details).     Catheter placed  without difficulty.  450 cc of urine returned.  Patient feels much better.   Final Clinical Impressions(s) / ED Diagnoses   Final diagnoses:  Acute urinary retention  Complication of Foley catheter, initial encounter [facility])    New Prescriptions New Prescriptions   No medications on file     Jola Schmidt, MD 03/08/17 1625

## 2017-03-08 NOTE — ED Triage Notes (Addendum)
Pt's urinary catheter will not drain properly and there has been no urinary output all day yesterday and today. Pt reports "white crystallized substance" came out of it but no urine. Pt reports pain and pressure when attempting to urinate.

## 2017-03-10 LAB — URINE CULTURE: Culture: 70000 — AB

## 2017-03-11 ENCOUNTER — Telehealth: Payer: Self-pay | Admitting: *Deleted

## 2017-03-11 NOTE — Telephone Encounter (Signed)
Post ED Visit - Positive Culture Follow-up  Culture report reviewed by antimicrobial stewardship pharmacist:  []  Elenor Quinones, Pharm.D. []  Heide Guile, Pharm.D., BCPS AQ-ID []  Parks Neptune, Pharm.D., BCPS []  Alycia Rossetti, Pharm.D., BCPS []  [place], Pharm.D., BCPS, AAHIVP []  Legrand Como, Pharm.D., BCPS, AAHIVP []  Salome Arnt, PharmD, BCPS []  Dimitri Ped, PharmD, BCPS []  Vincenza Hews, PharmD, BCPS  Positive urine culture, reviewed by Benedetto Goad, PA-C No further patient follow-up is required at this time.  Harlon Flor [facility] 03/11/2017, 10:11 AM

## 2017-03-11 NOTE — Progress Notes (Signed)
ED Antimicrobial Stewardship Positive Culture Follow Up   [name] is an 66 y.o. male who presented to [facility] on 03/08/2017 with a chief complaint of  Chief Complaint  Patient presents with  . urinary catheter issues  . Urinary Retention    Recent Results (from the past 720 hour(s))  Urine culture     Status: Abnormal   Collection Time: 02/19/17 10:33 PM  Result Value Ref Range Status   Specimen Description URINE, RANDOM  Final   Special Requests NONE  Final   Culture MULTIPLE SPECIES PRESENT, SUGGEST RECOLLECTION (A)  Final   Report Status 02/22/2017 FINAL  Final  Urine culture     Status: Abnormal   Collection Time: 03/08/17  4:20 PM  Result Value Ref Range Status   Specimen Description URINE, CATHETERIZED  Final   Special Requests NONE  Final   Culture 70,000 COLONIES/mL PROTEUS MIRABILIS (A)  Final   Report Status 03/10/2017 FINAL  Final   Organism ID, Bacteria PROTEUS MIRABILIS (A)  Final      Susceptibility   Proteus mirabilis - MIC*    AMPICILLIN <=2 SENSITIVE Sensitive     CEFAZOLIN 8 SENSITIVE Sensitive     CEFTRIAXONE <=1 SENSITIVE Sensitive     CIPROFLOXACIN <=0.25 SENSITIVE Sensitive     GENTAMICIN <=1 SENSITIVE Sensitive     IMIPENEM 4 SENSITIVE Sensitive     NITROFURANTOIN 128 RESISTANT Resistant     TRIMETH/SULFA <=20 SENSITIVE Sensitive     AMPICILLIN/SULBACTAM <=2 SENSITIVE Sensitive     PIP/TAZO <=4 SENSITIVE Sensitive     * 70,000 COLONIES/mL PROTEUS MIRABILIS    Pt presented with acute urinary retention, has chronic indwelling foley catheter.  No urinary symptoms in the ED, felt better after replacing the catheter.  No treatment needed.  ED Provider: Benedetto Goad, Arnell Asal 03/11/2017, 9:02 AM PharmD Candidate  Phone# [phone]

## 2017-04-03 NOTE — Progress Notes (Deleted)
[name] [place], [place] [address] Phone: [phone] Subjective:    I'm seeing this patient by the request  of:    CC: Bilateral knee pain follow-up  OAC:[id number]  [name] is a 66 y.o. male coming in with complaint of bilateral knee pain. Found to have severe osteoarthritic changes of the knees. Patient failed all conservative therapy and given viscous supplementation on 03/07/2017. Patient states       Past Medical History:  Diagnosis Date  . Anemia    normal Fe, nl B12, nl retic, nl EPO July '13  . Blood transfusion without reported diagnosis   . BPH (benign prostatic hyperplasia)   . Chronic back pain   . Chronic kidney disease    CKD III, obstructive nephropathy  . Diverticulosis   . Dysuria   . Elevated PSA, greater than or equal to 20 ng/ml June '13   PSA 107  . Hemorrhoids, internal, with bleeding, prolapse 09/19/2014  . Hyperlipidemia 05/29/2014  . Hypertension   . Obstructive uropathy 11/27/2015  . Renal insufficiency   . Tuberculosis    h/o PPD +  . UTI (urinary tract infection)    Past Surgical History:  Procedure Laterality Date  . CARPAL TUNNEL RELEASE Right   . COLONOSCOPY    . HEMORRHOID BANDING    . PROSTATE SURGERY    . SPLENECTOMY     Social History   Social History  . Marital status: Married    Spouse name: N/A  . Number of children: 2  . Years of education: 20   Occupational History  . [employer]    unemployed   Social History Main Topics  . Smoking status: Former Smoker    Types: Cigarettes  . Smokeless tobacco: Never Used     Comment: Quit 1981  . Alcohol use No     Comment: Quit 2004  . Drug use: No  . Sexual activity: No   Other Topics Concern  . Not on file   Social History Narrative   Native of [place], raised poor farming community. . To [place] '78 - finished College, all but Arts administrator. Married - wife in [place], blocked from [place] to  [place]. 1 son '90; 1 dtr '93. 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 Resources depleted.   Right handed.   Caffeine Hot daily one daily.   No Known Allergies Family History  Problem Relation Age of Onset  . Diabetes Mother   . Stroke Brother   . Hearing loss Brother   . Colon cancer Neg Hx   . Rectal cancer Neg Hx   . Stomach cancer Neg Hx   . Esophageal cancer Neg Hx      Past medical history, social, surgical and family history all reviewed in electronic medical record.  No pertanent information unless stated regarding to the chief complaint.   Review of Systems:Review of systems updated and as accurate as of 04/03/17  No headache, visual changes, nausea, vomiting, diarrhea, constipation, dizziness, abdominal pain, skin rash, fevers, chills, night sweats, weight loss, swollen lymph nodes, body aches, joint swelling, muscle aches, chest pain, shortness of breath, mood changes.   Objective  There were no vitals taken for this visit. Systems examined below as of 04/03/17   General: No apparent distress alert and oriented x3 mood and affect normal, dressed appropriately.  HEENT: Pupils equal, extraocular movements intact  Respiratory: Patient's speak in full sentences  and does not appear short of breath  Cardiovascular: No lower extremity edema, non tender, no erythema  Skin: Warm dry intact with no signs of infection or rash on extremities or on axial skeleton.  Abdomen: Soft nontender  Neuro: Cranial nerves II through XII are intact, neurovascularly intact in all extremities with 2+ DTRs and 2+ pulses.  Lymph: No lymphadenopathy of posterior or anterior cervical chain or axillae bilaterally.  Gait normal with good balance and coordination.  MSK:  Non tender with full range of motion and good stability and symmetric strength and tone of shoulders, elbows, wrist, hip, knee and ankles bilaterally.  Knee: valgus deformity noted. Large thigh to calf ratio.    Tender to palpation over medial and PF joint line.  ROM full in flexion and extension and lower leg rotation. instability with valgus force.  painful patellar compression. Patellar glide with moderate crepitus. Patellar and quadriceps tendons unremarkable. Hamstring and quadriceps strength is normal. Contralateral knee shows     Impression and Recommendations:     This case required medical decision making of moderate complexity.      Note: This dictation was prepared with Dragon dictation along with smaller phrase technology. Any transcriptional errors that result from this process are unintentional.

## 2017-04-04 ENCOUNTER — Ambulatory Visit: Payer: Medicare Other | Admitting: Family Medicine

## 2017-04-11 ENCOUNTER — Encounter: Payer: Self-pay | Admitting: Family Medicine

## 2017-04-11 ENCOUNTER — Ambulatory Visit: Payer: Medicare Other | Admitting: Family Medicine

## 2017-04-11 DIAGNOSIS — M170 Bilateral primary osteoarthritis of knee: Secondary | ICD-10-CM

## 2017-04-11 NOTE — Assessment & Plan Note (Signed)
Severe arthritis with acute gouty arthritis.  Patient likely will need to continue the medication.  Hopefully the [place] supplementation helps for some time.  Can repeat steroid injections if needed.  Patient will be leaving the country for multiple months potentially and will come back before then.

## 2017-04-11 NOTE — Patient Instructions (Signed)
Good to see you  Ice 20 minutes 2 times daily. Usually after activity and before bed. Continue the allopurinol and the colchicine  Lets see how long the other injections work.  See me again in 4 weeks and if needed we can do the other steroid injections.

## 2017-04-11 NOTE — Progress Notes (Signed)
[name] [name], [place] [address] Phone: [phone] Subjective:    I'm seeing this patient by the request  of:    CC: Bilateral knee pain  IOE:[id number]  [name] is a 66 y.o. male coming in with complaint of bilateral knee pain.  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  Patient elected to try conservative therapy but did start Visco supplementation after failing all conservative therapy.  Patient states he is feeling significantly better.  Has noticed much swelling.  Still having instability.  Patient will likely be leaving the country in the next 3 weeks and wants to hold on any other type of interventions at this time.      Past Medical History:  Diagnosis Date  . Anemia    normal Fe, nl B12, nl retic, nl EPO July '13  . Blood transfusion without reported diagnosis   . BPH (benign prostatic hyperplasia)   . Chronic back pain   . Chronic kidney disease    CKD III, obstructive nephropathy  . Diverticulosis   . Dysuria   . Elevated PSA, greater than or equal to 20 ng/ml June '13   PSA 107  . Hemorrhoids, internal, with bleeding, prolapse 09/19/2014  . Hyperlipidemia 05/29/2014  . Hypertension   . Obstructive uropathy 11/27/2015  . Renal insufficiency   . Tuberculosis    h/o PPD +  . UTI (urinary tract infection)    Past Surgical History:  Procedure Laterality Date  . CARPAL TUNNEL RELEASE Right   . COLONOSCOPY    . HEMORRHOID BANDING    . PROSTATE SURGERY    . SPLENECTOMY     Social History   Socioeconomic History  . Marital status: Married    Spouse name: None  . Number of children: 2  . Years of education: 73  . Highest education level: None  Social Needs  . Financial resource strain: None  . Food insecurity - worry: None  . Food insecurity - inability: None  . Transportation needs - medical: None  . Transportation needs - non-medical: None  Occupational History  .  Occupation: Lobbyist: [employer]    Comment: unemployed  Tobacco Use  . Smoking status: Former Smoker    Types: Cigarettes  . Smokeless tobacco: Never Used  . Tobacco comment: Quit 1981  Substance and Sexual Activity  . Alcohol use: No    Alcohol/week: 0.0 oz    Comment: Quit 2004  . Drug use: No  . Sexual activity: No  Other Topics Concern  . None  Social History Narrative   Native of [place], raised poor farming community. . To [place] '78 - finished College, all but Arts administrator. Married - wife in [place], blocked from [place] to [place]. 1 son '90; 1 dtr '93. 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 Resources depleted.   Right handed.   Caffeine Hot daily one daily.   No Known Allergies Family History  Problem Relation Age of Onset  . Diabetes Mother   . Stroke Brother   . Hearing loss Brother   . Colon cancer Neg Hx   . Rectal cancer Neg Hx   . Stomach cancer Neg Hx   . Esophageal cancer Neg Hx      Past medical history, social, surgical and family history all reviewed in electronic medical record.  No pertanent information unless stated regarding to the chief complaint.  Review of Systems:Review of systems updated and as accurate as of 04/11/17  No headache, visual changes, nausea, vomiting, diarrhea, constipation, dizziness, abdominal pain, skin rash, fevers, chills, night sweats, weight loss, swollen lymph nodes, body aches, joint swelling,  chest pain, shortness of breath, mood changes.  Positive muscle aches  Objective  Blood pressure (!) 158/90, pulse 63, height 6\' 1"  (1.854 m), weight 235 lb (106.6 kg), SpO2 98 %. Systems examined below as of 04/11/17   General: No apparent distress alert and oriented x3 mood and affect normal, dressed appropriately.  HEENT: Pupils equal, extraocular movements intact  Respiratory: Patient's speak in full sentences and does not appear short of breath  Cardiovascular: No lower  extremity edema, non tender, no erythema  Skin: Warm dry intact with no signs of infection or rash on extremities or on axial skeleton.  Abdomen: Soft nontender  Neuro: Cranial nerves II through XII are intact, neurovascularly intact in all extremities with 2+ DTRs and 2+ pulses.  Lymph: No lymphadenopathy of posterior or anterior cervical chain or axillae bilaterally.  Gait antalgic gait MSK:  Non tender with full range of motion and good stability and symmetric strength and tone of shoulders, elbows, wrist, hip, and ankles bilaterally.  Severe arthritic changes in multiple joints Knee: Lateral valgus deformity noted. Large thigh to calf ratio.  Less swelling than previous exam Tender to palpation over medial and PF joint line.  ROM full in flexion and extension and lower leg rotation. instability with valgus force.  painful patellar compression.  Less tenderness Patellar glide with moderate crepitus. Patellar and quadriceps tendons unremarkable. Hamstring and quadriceps strength is normal.    Impression and Recommendations:     This case required medical decision making of moderate complexity.      Note: This dictation was prepared with Dragon dictation along with smaller phrase technology. Any transcriptional errors that result from this process are unintentional.

## 2017-04-17 ENCOUNTER — Emergency Department (HOSPITAL_COMMUNITY)
Admission: EM | Admit: 2017-04-17 | Discharge: 2017-04-17 | Disposition: A | Discharge status: Home / Self Care | Payer: Medicare Other | Attending: Emergency Medicine | Admitting: Emergency Medicine

## 2017-04-17 ENCOUNTER — Encounter (HOSPITAL_COMMUNITY): Payer: Self-pay | Admitting: Nurse Practitioner

## 2017-04-17 DIAGNOSIS — N183 Chronic kidney disease, stage 3 (moderate): Secondary | ICD-10-CM | POA: Diagnosis not present

## 2017-04-17 DIAGNOSIS — T83098A Other mechanical complication of other indwelling urethral catheter, initial encounter: Secondary | ICD-10-CM | POA: Insufficient documentation

## 2017-04-17 DIAGNOSIS — Z7982 Long term (current) use of aspirin: Secondary | ICD-10-CM | POA: Diagnosis not present

## 2017-04-17 DIAGNOSIS — Z79899 Other long term (current) drug therapy: Secondary | ICD-10-CM | POA: Diagnosis not present

## 2017-04-17 DIAGNOSIS — Y829 Unspecified medical devices associated with adverse incidents: Secondary | ICD-10-CM | POA: Insufficient documentation

## 2017-04-17 DIAGNOSIS — T839XXA Unspecified complication of genitourinary prosthetic device, implant and graft, initial encounter: Secondary | ICD-10-CM

## 2017-04-17 DIAGNOSIS — Z87891 Personal history of nicotine dependence: Secondary | ICD-10-CM | POA: Insufficient documentation

## 2017-04-17 DIAGNOSIS — I129 Hypertensive chronic kidney disease with stage 1 through stage 4 chronic kidney disease, or unspecified chronic kidney disease: Secondary | ICD-10-CM | POA: Diagnosis not present

## 2017-04-17 NOTE — ED Provider Notes (Signed)
[facility] DEPT Provider Note   CSN: [id number] Arrival date & time: 04/17/17  2028     History   Chief Complaint Chief Complaint  Patient presents with  . Foley Cath Complication    HPI [name] is a 66 y.o. male.  66 year old male with past medical history including indwelling Foley catheter, CKD, HTN who p/w Foley catheter dysfunction.  He states that his afternoon he began noticing that his catheter was not draining as well and he did not have much urine in his bag.  He thought it was clogged and tried to fix it but eventually took it out himself. He reports mild abdominal discomfort because of the bladder distention but otherwise he denies any other complaints.  No fevers, vomiting, or recent illness.  He has otherwise been well recently.   The history is provided by the patient.    Past Medical History:  Diagnosis Date  . Anemia    normal Fe, nl B12, nl retic, nl EPO July '13  . Blood transfusion without reported diagnosis   . BPH (benign prostatic hyperplasia)   . Chronic back pain   . Chronic kidney disease    CKD III, obstructive nephropathy  . Diverticulosis   . Dysuria   . Elevated PSA, greater than or equal to 20 ng/ml June '13   PSA 107  . Hemorrhoids, internal, with bleeding, prolapse 09/19/2014  . Hyperlipidemia 05/29/2014  . Hypertension   . Obstructive uropathy 11/27/2015  . Renal insufficiency   . Tuberculosis    h/o PPD +  . UTI (urinary tract infection)     Patient Active Problem List   Diagnosis Date Noted  . Acute gouty arthritis 12/01/2016  . Degenerative arthritis of knee, bilateral 11/18/2016  . Urinary retention due to benign prostatic hyperplasia 10/24/2016  . Mass of right side of neck 10/20/2016  . Gout 10/20/2016  . Knee pain, acute 10/12/2016  . Hypokalemia 05/28/2016  . Encounter for well adult exam with abnormal findings 11/27/2015  . Obstructive uropathy 11/27/2015  . Elevated PSA 11/27/2015   . Acute pyelonephritis 05/27/2015  . Generalized bloating 05/27/2015  . Constipation 05/27/2015  . Chest pain 05/27/2015  . AKI (acute kidney injury) ([place]) 05/27/2015  . Acute sinus infection 11/22/2014  . Cough 09/25/2014  . Hemorrhoids, internal, with bleeding, prolapse 09/19/2014  . 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 Hyperlipidemia 05/29/2014  . Lumbar radiculopathy 05/23/2014  . Lumbar stenosis 11/20/2013  . Abnormal glucose 11/06/2013  . Unspecified hereditary and idiopathic peripheral neuropathy 01/22/2013  . 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 CKD (chronic kidney disease) stage 3, GFR 30-59 ml/min (HCC) 11/24/2011  . Hematuria 11/24/2011  . Hydronephrosis 11/24/2011  . Anemia 11/24/2011  . BRADYCARDIA 02/05/2010  . TINEA PEDIS 01/29/2010  . 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 PERIPHERAL EDEMA 01/29/2010  . ABNORMAL ELECTROCARDIOGRAM 01/29/2010  . 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  . Osteoarthrosis, generalized, 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  . ONYCHOMYCOSIS, TOENAILS 10/02/2007  . BPH (benign prostatic hyperplasia) 10/02/2007  . Essential hypertension 03/06/2007    Past Surgical History:  Procedure Laterality Date  . CARPAL TUNNEL RELEASE Right   . COLONOSCOPY    . HEMORRHOID BANDING    . PROSTATE SURGERY    . SPLENECTOMY         Home Medications    Prior to Admission medications   Medication Sig Start Date End Date Taking? Authorizing Provider  acetaminophen (TYLENOL) 325 MG tablet Take 2 tablets (650 mg total)  by mouth every 6 (six) hours as needed. 05/23/15   Varney Biles, MD  Acetaminophen-Codeine (TYLENOL/CODEINE #3) 300-30 MG tablet Take 0.5-1 tablets by mouth every 6 (six) hours as needed for pain. 10/12/16   Plotnikov, Evie Lacks, MD  allopurinol (ZYLOPRIM) 100 MG tablet Take 1 tablet (100 mg total) by mouth daily. 10/20/16   Biagio Borg, MD  amLODipine (NORVASC) 5 MG tablet Take 1 tablet (5 mg total) by mouth daily. 10/12/16   Plotnikov,  Evie Lacks, MD  aspirin 81 MG tablet Take 81 mg by mouth daily.    [provider]  atorvastatin (LIPITOR) 40 MG tablet Take 1 tablet (40 mg total) by mouth daily. Patient taking differently: Take 40 mg by mouth daily as needed [facility] he feels is needed).  10/20/16   Biagio Borg, MD  colchicine 0.6 MG tablet Take 1 tablet (0.6 mg total) by mouth daily. 12/10/16   Biagio Borg, MD  metoprolol tartrate (LOPRESSOR) 25 MG tablet Take 1 tablet (25 mg total) by mouth 2 (two) times daily. 12/29/16   Biagio Borg, MD  oxybutynin (DITROPAN) 5 MG tablet Take 1 tablet (5 mg total) by mouth daily. Patient taking differently: Take 5 mg by mouth daily as needed for bladder spasms.  05/29/14   Biagio Borg, MD  oxyCODONE-acetaminophen (PERCOCET/ROXICET) 5-325 MG tablet Take 2 tablets by mouth every 4 (four) hours as needed for severe pain. 12/01/16   Biagio Borg, MD    Family History Family History  Problem Relation Age of Onset  . Diabetes Mother   . Stroke Brother   . Hearing loss Brother   . Colon cancer Neg Hx   . Rectal cancer Neg Hx   . Stomach cancer Neg Hx   . Esophageal cancer Neg Hx     Social History Social History   Tobacco Use  . Smoking status: Former Smoker    Types: Cigarettes  . Smokeless tobacco: Never Used  . Tobacco comment: Quit 1981  Substance Use Topics  . Alcohol use: No    Alcohol/week: 0.0 oz    Comment: Quit 2004  . Drug use: No     Allergies   Patient has no known allergies.   Review of Systems Review of Systems All other systems reviewed and are negative except that which was mentioned in HPI   Physical Exam Updated Vital Signs BP (!) 153/96 (BP Location: Left Arm)   Pulse 62   Temp 98.2 F (36.8 C) (Oral)   Resp 14   SpO2 99%   Physical Exam  Constitutional: He is oriented to person, place, and time. He appears well-developed and well-nourished. No distress.  HENT:  Head: Normocephalic and atraumatic.  Eyes: Conjunctivae are normal.   Neck: Neck supple.  Cardiovascular: Normal rate, regular rhythm and normal heart sounds.  No murmur heard. Pulmonary/Chest: Effort normal and breath sounds normal.  Abdominal: Soft. Bowel sounds are normal. He exhibits distension (mild suprapubic). There is tenderness (mild suprapubic). There is no rebound and no guarding.  Musculoskeletal: He exhibits no edema.  Neurological: He is alert and oriented to person, place, and time.  Fluent speech  Skin: Skin is warm and dry.  Psychiatric: He has a normal mood and affect. Judgment normal.  Nursing note and vitals reviewed.    ED Treatments / Results  Labs (all labs ordered are listed, but only abnormal results are displayed) Labs Reviewed - No data to display  EKG  EKG Interpretation None  Radiology No results found.  Procedures Procedures (including critical care time)  Medications Ordered in ED Medications - No data to display   Initial Impression / Assessment and Plan / ED Course  I have reviewed the triage vital signs and the nursing notes.   Foley malfunctioned, removed it at home. 245ml post-void residual, therefore foley replaced by RN Rica Mote. Pt comfortable and clear urine on repeat exam. Discussed return precautions.  Final Clinical Impressions(s) / ED Diagnoses   Final diagnoses:  Foley catheter problem, initial encounter [facility])    ED Discharge Orders    None       Little, Wenda Overland, MD 04/17/17 2224

## 2017-04-17 NOTE — ED Triage Notes (Addendum)
Pt states he has a chronic indwelling foley catheter. Adds that he noticed it was not draining as well as it normally would, he tried to trouble shoot it by flushing it but suspecting it may be "blown," he removed it and came in for evaluation. A post void bladder scan shows about 200 mL. Pt denies pain at this time.

## 2017-04-18 ENCOUNTER — Encounter: Payer: Self-pay | Admitting: *Deleted

## 2017-04-18 ENCOUNTER — Other Ambulatory Visit: Payer: Self-pay | Admitting: *Deleted

## 2017-04-18 NOTE — Patient Outreach (Signed)
[facility]) Care Management  04/18/2017  Karter Hellmer 1951-03-02 [id number]   Telephone Screen  Referral Date: 04/18/2017 Referral Source: [facility] ED Census Reason for Referral: 6 or more ED visits in the past 6 months Insurance: [employer] Attempt: Successful telephone outreach to patient.  HIPAA verified.  Telephone screening completed with patient.  Social: Patient originally from [place].  Has been here in the [place] since 1978.  Considers English as his primary language.  Wife still resides in [place].  Patient lives with a roommate.  States he is independent with his ADLs and needs some assistance with his IADLs.  Does not drive currently.  He utilizes public transportation/bus to get around town.  He seeks support from a close family friend when needed.  Conditions:  Per discussion and chart review, PMH: chronic kidney disease, BPH, obstructive uropathy, chronic indwelling foley catheter, hypertension, hyperlipidemia, gout, and anemia.  Patient states he has had indwelling foley catheter for the last 5 years related to frequent urinary tract infections and urinary retention.  He see Dr. Pilar Jarvis with [facility].  Patient states he typically has his foley catheter exchanged every month in the MD office.  Admits to having to go to the emergency room on several occasions to have catheter replaced due to foley clogging and he is unable to unclog it at home himself.  [facility] RN consult placed to assist with education on management of chronic indwelling catheter.  Medications: Patient states he is having trouble affording his medications. States some of his co pays are high.  The most concerning for the patient is his co pay for colchicine.  [place] consult will be placed.  Appointments: Patient last seen his primary care provider on 03/01/2017 and sees him about every 6 months. His next scheduled appointment is August 30, 2017.   Patient states he has been seeing orthopedics recently for knee pain.  States he gets to his doctor's appointments utilizing public transportation/city bus.  Advanced Directives: States he does not have an advanced directive in place at this time.   Consent: Patient verbally consents to [place]: [place] will notify [facility] administrative assistant of case status. RN Health Coach will send successful outreach letter to patient. RN Health Coach will send referral to [facility] RN for further in home eval/assessment of care needs and management of chronic conditions. RN Health Coach will send [place] referral for medication assistance.   [place] [phone] [email or web address]

## 2017-04-19 ENCOUNTER — Other Ambulatory Visit: Payer: Self-pay | Admitting: *Deleted

## 2017-04-19 NOTE — Patient Outreach (Signed)
[facility]) Care Management  04/19/2017  [name] 14-Sep-1950 [id number]   Initial outreach attempt unsuccessful however RN able to leave a HIPAA approved voice message requesting a call back. Will further assess on call back and attempt to call the following business day (tomorrow) per policy if no response on today's call.   Raina Mina, RN Care Management Coordinator [place] Office [phone]

## 2017-04-20 ENCOUNTER — Other Ambulatory Visit: Payer: Self-pay | Admitting: *Deleted

## 2017-04-20 NOTE — Patient Outreach (Addendum)
[facility]) Care Management  04/20/2017  [name] 11-Jun-1950 [id number]    This is the 2nd outreach attempt to reach pt however only able to leave a HIPAA approved voice message requesting a call back. Will inquired on possible needs at that time. Will continue attempt to contact this pt with another call tomorrow.   Raina Mina, RN Care Management Coordinator [place] Office [phone]

## 2017-04-21 ENCOUNTER — Encounter: Payer: Self-pay | Admitting: *Deleted

## 2017-04-21 ENCOUNTER — Telehealth: Payer: Self-pay | Admitting: Pharmacist

## 2017-04-21 ENCOUNTER — Other Ambulatory Visit: Payer: Self-pay | Admitting: *Deleted

## 2017-04-21 NOTE — Patient Outreach (Signed)
[place] [facility]) Care Management  [name]   04/21/2017  [name] [birth year] [id number]  Subjective: Called patient regarding medication assistance per referral.  HIPAA identifiers were obtained.  Patient is a 66 year old male with multiple medical conditions including but not limited to:  Gout, hypertension, degenerative arthritis of the knee, hyperlipidemia and lumbar stenosis.  Patient es Objective:   Encounter Medications: Outpatient Encounter Medications as of 04/21/2017  Medication Sig Note  . acetaminophen (TYLENOL) 325 MG tablet Take 2 tablets (650 mg total) by mouth every 6 (six) hours as needed.   Marland Kitchen allopurinol (ZYLOPRIM) 100 MG tablet Take 1 tablet (100 mg total) by mouth daily.   Marland Kitchen amLODipine (NORVASC) 5 MG tablet Take 1 tablet (5 mg total) by mouth daily.   Marland Kitchen aspirin 81 MG tablet Take 81 mg by mouth daily.   Marland Kitchen atorvastatin (LIPITOR) 40 MG tablet Take 1 tablet (40 mg total) by mouth daily. (Patient taking differently: Take 40 mg by mouth daily as needed [facility] he feels is needed). )   . colchicine 0.6 MG tablet Take 1 tablet (0.6 mg total) by mouth daily.   . metoprolol tartrate (LOPRESSOR) 25 MG tablet Take 1 tablet (25 mg total) by mouth 2 (two) times daily.   Marland Kitchen oxybutynin (DITROPAN) 5 MG tablet Take 1 tablet (5 mg total) by mouth daily. (Patient taking differently: Take 5 mg by mouth daily as needed for bladder spasms. ) 04/21/2017: Only takes PRN  . tamsulosin (FLOMAX) 0.4 MG CAPS capsule Take 1 capsule 2 (two) times daily by mouth.   . [DISCONTINUED] Acetaminophen-Codeine (TYLENOL/CODEINE #3) 300-30 MG tablet Take 0.5-1 tablets by mouth every 6 (six) hours as needed for pain. (Patient not taking: Reported on 04/21/2017)   . [DISCONTINUED] oxyCODONE-acetaminophen (PERCOCET/ROXICET) 5-325 MG tablet Take 2 tablets by mouth every 4 (four) hours as needed for severe pain. (Patient not taking: Reported on 04/21/2017)    No facility-administered  encounter medications on file as of 04/21/2017.     Functional Status: In your present state of health, do you have any difficulty performing the following activities: 10/24/2016 10/24/2016  Hearing? - N  Vision? - N  Difficulty concentrating or making decisions? - N  Walking or climbing stairs? - N  Dressing or bathing? - N  Doing errands, shopping? N -  Preparing Food and eating ? - -  Using the Toilet? - -  In the past six months, have you accidently leaked urine? - -  Comment - -  Do you have problems with loss of bowel control? - -  Managing your Medications? - -  Managing your Finances? - -  Housekeeping or managing your Housekeeping? - -  Some recent data might be hidden    Fall/Depression Screening: Fall Risk  04/21/2017 04/18/2017 10/20/2016  Falls in the past year? No No No  Number falls in past yr: - - -  Injury with Fall? - - -   PHQ 2/9 Scores 04/18/2017 10/20/2016 08/04/2016 11/27/2015 05/29/2014 12/04/2013  PHQ - 2 Score 0 0 0 0 0 1      Assessment: Patient's medications were reconciled via telephone.   Drugs sorted by system:  Cardiovascular: Amlodipine Atorvastatin Metoprolol Aspirin  Urology Tamsulosin Oxybutynin  Miscellaneous: Allopurinol Colchicine  Pain: Acetaminophen  Medication Assistance Findings: -patient reported he could not afford colchicine and that when he went to get it the last time it was >$100 -patient's pharmacy was called. The pharmacist was able to get a claim  to go through for $45 for 30 tablets  -patient was called back to see if $45 was within his budget but he did not answer the phone -the pharmacist reported the cash price for 15 tablets when the patient had it filled in August was >$100 but his copay was only $22   Plan: Call patient back to review the $45 copay.   Elayne Guerin, PharmD, [place] Clinical Pharmacist [phone]

## 2017-04-21 NOTE — Patient Outreach (Signed)
[place] [facility]) Care Management  04/21/2017  Nicola Quesnell 06/02/1951 [id number]    RN attempted a 18rd outreach call to pt today however remains unsuccessful. Will send outreach letter and allow time for pt to response. Note RN able to leave a HIPAA approved voice messagae requested a call back. Will further engage with [facility] services at that time.   Raina Mina, RN Care Management Coordinator [place] Office [phone]

## 2017-04-22 ENCOUNTER — Ambulatory Visit: Payer: Medicare Other | Admitting: Pharmacist

## 2017-04-23 ENCOUNTER — Encounter: Payer: Self-pay | Admitting: Family Medicine

## 2017-04-23 ENCOUNTER — Ambulatory Visit (INDEPENDENT_AMBULATORY_CARE_PROVIDER_SITE_OTHER): Payer: Medicare Other | Admitting: Family Medicine

## 2017-04-23 VITALS — BP 120/80 | HR 67 | Temp 97.8°F | Ht 73.0 in | Wt 243.5 lb

## 2017-04-23 DIAGNOSIS — J209 Acute bronchitis, unspecified: Secondary | ICD-10-CM

## 2017-04-23 MED ORDER — AZITHROMYCIN 250 MG PO TABS: ORAL_TABLET | ORAL | 0 refills | Status: DC

## 2017-04-23 NOTE — Progress Notes (Signed)
   Subjective:    Patient ID: [name], male    DOB: Jul 16, 1950, 66 y.o.   MRN: [id number]  HPI Here for 4 days of PND, chest congestion, and coughing up yellow sputum. No fever. Using Coricidin.    Review of Systems  Constitutional: Negative.   HENT: Positive for congestion and postnasal drip. Negative for sinus pressure, sinus pain and sore throat.   Eyes: Negative.   Respiratory: Positive for cough and chest tightness.        Objective:   Physical Exam  Constitutional: He appears well-developed and well-nourished.  HENT:  Right Ear: External ear normal.  Left Ear: External ear normal.  Nose: Nose normal.  Mouth/Throat: Oropharynx is clear and moist.  Eyes: Conjunctivae are normal.  Neck: No thyromegaly present.  Pulmonary/Chest: Effort normal. No respiratory distress. He has no wheezes. He has no rales.  Scattered rhonchi   Lymphadenopathy:    He has no cervical adenopathy.          Assessment & Plan:  Bronchitis, treat with a Zpack.  Alysia Penna, MD

## 2017-04-25 ENCOUNTER — Ambulatory Visit: Payer: Medicare Other | Admitting: Family Medicine

## 2017-04-26 ENCOUNTER — Ambulatory Visit: Payer: Self-pay | Admitting: Pharmacist

## 2017-04-26 ENCOUNTER — Other Ambulatory Visit: Payer: Self-pay | Admitting: Pharmacist

## 2017-04-27 NOTE — Patient Outreach (Signed)
[facility]) Care Management  04/27/2017  [name] 10/15/1950 [id number]   Called patient to follow up on getting colchicine.  Unfortunately, he did not answer.  HIPAA compliant message left on the patient's voicemail.  Plan:  Call patient back in 7-10 business days.  Elayne Guerin, PharmD, [place] Clinical Pharmacist [phone]

## 2017-05-05 ENCOUNTER — Ambulatory Visit: Payer: Self-pay | Admitting: Pharmacist

## 2017-05-06 ENCOUNTER — Other Ambulatory Visit: Payer: Self-pay | Admitting: Pharmacist

## 2017-05-06 ENCOUNTER — Encounter: Payer: Self-pay | Admitting: *Deleted

## 2017-05-06 ENCOUNTER — Other Ambulatory Visit: Payer: Self-pay | Admitting: *Deleted

## 2017-05-06 NOTE — Patient Outreach (Signed)
[place]) Care Management  05/06/2017  [name] 01-27-1951 [id number]   Patient was called to follow up on colchicine. HIPAA identifiers were obtained.  Patient confirmed he was able to pick up the colchicine refill and was able to pay the $45 copay.    Plan: Close patient's pharmacy case as his needs have been met. Patient is still active with [place] Management.   Elayne Guerin, PharmD, [place] Clinical Pharmacist [phone]

## 2017-05-06 NOTE — Patient Outreach (Signed)
[place] [facility]) Care Management  05/06/2017  [name] [birth year] [id number]    Due to unsuccessful outreach calls and no response to the outreach letter mailed earlier this month. This case will be closed and primary provider will be notified.  Raina Mina, RN Care Management Coordinator [place] Office [phone]

## 2017-05-11 DIAGNOSIS — R972 Elevated prostate specific antigen [PSA]: Secondary | ICD-10-CM | POA: Diagnosis not present

## 2017-05-21 ENCOUNTER — Emergency Department (HOSPITAL_COMMUNITY)
Admission: EM | Admit: 2017-05-21 | Discharge: 2017-05-21 | Disposition: A | Discharge status: Home / Self Care | Payer: Medicare Other | Attending: Emergency Medicine | Admitting: Emergency Medicine

## 2017-05-21 ENCOUNTER — Encounter (HOSPITAL_COMMUNITY): Payer: Self-pay

## 2017-05-21 ENCOUNTER — Other Ambulatory Visit: Payer: Self-pay

## 2017-05-21 DIAGNOSIS — Z79899 Other long term (current) drug therapy: Secondary | ICD-10-CM | POA: Insufficient documentation

## 2017-05-21 DIAGNOSIS — R338 Other retention of urine: Secondary | ICD-10-CM | POA: Insufficient documentation

## 2017-05-21 DIAGNOSIS — R339 Retention of urine, unspecified: Secondary | ICD-10-CM

## 2017-05-21 DIAGNOSIS — T839XXA Unspecified complication of genitourinary prosthetic device, implant and graft, initial encounter: Secondary | ICD-10-CM

## 2017-05-21 DIAGNOSIS — Z87891 Personal history of nicotine dependence: Secondary | ICD-10-CM | POA: Insufficient documentation

## 2017-05-21 DIAGNOSIS — T83098A Other mechanical complication of other indwelling urethral catheter, initial encounter: Secondary | ICD-10-CM | POA: Diagnosis not present

## 2017-05-21 DIAGNOSIS — N183 Chronic kidney disease, stage 3 (moderate): Secondary | ICD-10-CM | POA: Diagnosis not present

## 2017-05-21 DIAGNOSIS — R1030 Lower abdominal pain, unspecified: Secondary | ICD-10-CM | POA: Diagnosis present

## 2017-05-21 DIAGNOSIS — I129 Hypertensive chronic kidney disease with stage 1 through stage 4 chronic kidney disease, or unspecified chronic kidney disease: Secondary | ICD-10-CM | POA: Insufficient documentation

## 2017-05-21 DIAGNOSIS — Z466 Encounter for fitting and adjustment of urinary device: Secondary | ICD-10-CM | POA: Insufficient documentation

## 2017-05-21 LAB — URINALYSIS, ROUTINE W REFLEX MICROSCOPIC
Bilirubin Urine: NEGATIVE
GLUCOSE, UA: NEGATIVE mg/dL
Ketones, ur: NEGATIVE mg/dL
NITRITE: POSITIVE — AB
Protein, ur: 30 mg/dL — AB
SPECIFIC GRAVITY, URINE: 1.006 (ref 1.005–1.030)
Squamous Epithelial / LPF: NONE SEEN
pH: 8 (ref 5.0–8.0)

## 2017-05-21 MED ORDER — LIDOCAINE HCL 2 % EX GEL
1.0000 "application " | Freq: Once | CUTANEOUS | Status: AC | PRN
  Administered 2017-05-21: 1 via URETHRAL
  Filled 2017-05-21: qty 11

## 2017-05-21 NOTE — ED Triage Notes (Signed)
Pt has a catheter and states it's been clogged since noon, hes' had the foley for three weeks

## 2017-05-21 NOTE — ED Notes (Signed)
Pt given a [place] sandwich, graham crackers, and orange juice.

## 2017-05-21 NOTE — ED Provider Notes (Signed)
[facility] DEPT Provider Note   CSN: [id number] Arrival date & time: 05/21/17  1954     History   Chief Complaint Chief Complaint  Patient presents with  . Urinary Retention    HPI [name] is a 66 y.o. male.  HPI Patient reports around noon his catheter stopped draining.  He started having severe pain in his suprapubic area.  This has happened previously.  He was not having fever nausea vomiting or generalized symptoms leading up to this.  He reports he has multiple medical problems but there is no immediate problems other than his catheter. Past Medical History:  Diagnosis Date  . Anemia    normal Fe, nl B12, nl retic, nl EPO July '13  . Blood transfusion without reported diagnosis   . BPH (benign prostatic hyperplasia)   . Chronic back pain   . Chronic kidney disease    CKD III, obstructive nephropathy  . Diverticulosis   . Dysuria   . Elevated PSA, greater than or equal to 20 ng/ml June '13   PSA 107  . Hemorrhoids, internal, with bleeding, prolapse 09/19/2014  . Hyperlipidemia 05/29/2014  . Hypertension   . Obstructive uropathy 11/27/2015  . Renal insufficiency   . Tuberculosis    h/o PPD +  . UTI (urinary tract infection)     Patient Active Problem List   Diagnosis Date Noted  . Acute gouty arthritis 12/01/2016  . Degenerative arthritis of knee, bilateral 11/18/2016  . Urinary retention due to benign prostatic hyperplasia 10/24/2016  . Mass of right side of neck 10/20/2016  . Gout 10/20/2016  . Knee pain, acute 10/12/2016  . Hypokalemia 05/28/2016  . Encounter for well adult exam with abnormal findings 11/27/2015  . Obstructive uropathy 11/27/2015  . Elevated PSA 11/27/2015  . Acute pyelonephritis 05/27/2015  . Generalized bloating 05/27/2015  . Constipation 05/27/2015  . Chest pain 05/27/2015  . AKI (acute kidney injury) ([place]) 05/27/2015  . Acute sinus infection 11/22/2014  . Cough 09/25/2014  . Hemorrhoids,  internal, with bleeding, prolapse 09/19/2014  . 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 Hyperlipidemia 05/29/2014  . Lumbar radiculopathy 05/23/2014  . Lumbar stenosis 11/20/2013  . Abnormal glucose 11/06/2013  . Unspecified hereditary and idiopathic peripheral neuropathy 01/22/2013  . 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 CKD (chronic kidney disease) stage 3, GFR 30-59 ml/min (HCC) 11/24/2011  . Hematuria 11/24/2011  . Hydronephrosis 11/24/2011  . Anemia 11/24/2011  . BRADYCARDIA 02/05/2010  . TINEA PEDIS 01/29/2010  . 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 PERIPHERAL EDEMA 01/29/2010  . ABNORMAL ELECTROCARDIOGRAM 01/29/2010  . 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  . Osteoarthrosis, generalized, 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  . ONYCHOMYCOSIS, TOENAILS 10/02/2007  . BPH (benign prostatic hyperplasia) 10/02/2007  . Essential hypertension 03/06/2007    Past Surgical History:  Procedure Laterality Date  . CARPAL TUNNEL RELEASE Right   . COLONOSCOPY    . HEMORRHOID BANDING    . PROSTATE SURGERY    . SPLENECTOMY         Home Medications    Prior to Admission medications   Medication Sig Start Date End Date Taking? Authorizing Provider  acetaminophen (TYLENOL) 325 MG tablet Take 2 tablets (650 mg total) by mouth every 6 (six) hours as needed. 05/23/15   Varney Biles, MD  allopurinol (ZYLOPRIM) 100 MG tablet Take 1 tablet (100 mg total) by mouth daily. 10/20/16   Biagio Borg, MD  amLODipine (NORVASC) 5 MG tablet Take 1 tablet (5 mg total)  by mouth daily. 10/12/16   Plotnikov, Evie Lacks, MD  aspirin 81 MG tablet Take 81 mg by mouth daily.    [provider]  atorvastatin (LIPITOR) 40 MG tablet Take 1 tablet (40 mg total) by mouth daily. Patient taking differently: Take 40 mg by mouth daily as needed [facility] he feels is needed).  10/20/16   Biagio Borg, MD  azithromycin [facility]) 250 MG tablet As directed 04/23/17   Laurey Morale, MD  colchicine 0.6 MG tablet Take 1  tablet (0.6 mg total) by mouth daily. 12/10/16   Biagio Borg, MD  metoprolol tartrate (LOPRESSOR) 25 MG tablet Take 1 tablet (25 mg total) by mouth 2 (two) times daily. 12/29/16   Biagio Borg, MD  oxybutynin (DITROPAN) 5 MG tablet Take 1 tablet (5 mg total) by mouth daily. Patient taking differently: Take 5 mg by mouth daily as needed for bladder spasms.  05/29/14   Biagio Borg, MD  tamsulosin (FLOMAX) 0.4 MG CAPS capsule Take 1 capsule 2 (two) times daily by mouth. 04/11/17   Nickie Retort, MD    Family History Family History  Problem Relation Age of Onset  . Diabetes Mother   . Stroke Brother   . Hearing loss Brother   . Colon cancer Neg Hx   . Rectal cancer Neg Hx   . Stomach cancer Neg Hx   . Esophageal cancer Neg Hx     Social History Social History   Tobacco Use  . Smoking status: Former Smoker    Types: Cigarettes  . Smokeless tobacco: Never Used  . Tobacco comment: Quit 1981  Substance Use Topics  . Alcohol use: No    Alcohol/week: 0.0 oz    Comment: Quit 2004  . Drug use: No     Allergies   Patient has no known allergies.   Review of Systems Review of Systems 10 Systems reviewed and are negative for acute change except as noted in the HPI.   Physical Exam Updated Vital Signs BP (!) 149/102   Pulse 67   Temp 98 F (36.7 C) (Oral)   Resp 15   SpO2 99%   Physical Exam  Constitutional: He is oriented to person, place, and time. He appears well-developed and well-nourished.  HENT:  Head: Normocephalic and atraumatic.  Eyes: Conjunctivae are normal.  Neck: Neck supple.  Cardiovascular: Normal rate, regular rhythm and normal heart sounds.  No murmur heard. Pulmonary/Chest: Effort normal and breath sounds normal. No respiratory distress.  Abdominal: Soft. There is no tenderness.  Musculoskeletal: He exhibits no edema.  Neurological: He is alert and oriented to person, place, and time. No cranial nerve deficit. He exhibits normal muscle tone.  Coordination normal.  Skin: Skin is warm and dry.  Psychiatric: He has a normal mood and affect.  Nursing note and vitals reviewed.    ED Treatments / Results  Labs (all labs ordered are listed, but only abnormal results are displayed) Labs Reviewed  URINALYSIS, ROUTINE W REFLEX MICROSCOPIC - Abnormal; Notable for the following components:      Result Value   APPearance CLOUDY (*)    Hgb urine dipstick LARGE (*)    Protein, ur 30 (*)    Nitrite POSITIVE (*)    Leukocytes, UA LARGE (*)    Bacteria, UA MANY (*)    All other components within normal limits    EKG  EKG Interpretation None       Radiology No results found.  Procedures  Procedures (including critical care time)  Medications Ordered in ED Medications  lidocaine (XYLOCAINE) 2 % jelly 1 application (1 application Urethral Given 05/21/17 2107)     Initial Impression / Assessment and Plan / ED Course  I have reviewed the triage vital signs and the nursing notes.  Pertinent labs & imaging results that were available during my care of the patient were reviewed by me and considered in my medical decision making (see chart for details).      Final Clinical Impressions(s) / ED Diagnoses   Final diagnoses:  Urinary retention   Once Foley catheter draining, patient felt much relieved.  Urinalysis is grossly positive.  This has been a recurrent finding.  Suspect bacterial colonization.  Patient does not have any constitutional symptoms.  He is counseled on return symptoms and contacting his urologist on Monday. ED Discharge Orders    None       Charlesetta Shanks, MD 05/21/17 2324

## 2017-05-21 NOTE — Discharge Instructions (Signed)
1. Call your urologist Monday to recheck your urine specimen.  2. Return if you felt fever, generally feeling sick, having abdominal pain, problems with your catheter or other concerns.

## 2017-06-03 ENCOUNTER — Encounter (HOSPITAL_COMMUNITY): Payer: Self-pay | Admitting: Emergency Medicine

## 2017-06-03 ENCOUNTER — Emergency Department (HOSPITAL_COMMUNITY)
Admission: EM | Admit: 2017-06-03 | Discharge: 2017-06-03 | Disposition: A | Discharge status: Home / Self Care | Payer: Medicare Other | Attending: Emergency Medicine | Admitting: Emergency Medicine

## 2017-06-03 DIAGNOSIS — I129 Hypertensive chronic kidney disease with stage 1 through stage 4 chronic kidney disease, or unspecified chronic kidney disease: Secondary | ICD-10-CM | POA: Insufficient documentation

## 2017-06-03 DIAGNOSIS — T839XXA Unspecified complication of genitourinary prosthetic device, implant and graft, initial encounter: Secondary | ICD-10-CM | POA: Diagnosis not present

## 2017-06-03 DIAGNOSIS — Y731 Therapeutic (nonsurgical) and rehabilitative gastroenterology and urology devices associated with adverse incidents: Secondary | ICD-10-CM | POA: Insufficient documentation

## 2017-06-03 DIAGNOSIS — Z7982 Long term (current) use of aspirin: Secondary | ICD-10-CM | POA: Diagnosis not present

## 2017-06-03 DIAGNOSIS — Z87891 Personal history of nicotine dependence: Secondary | ICD-10-CM | POA: Diagnosis not present

## 2017-06-03 DIAGNOSIS — Z79899 Other long term (current) drug therapy: Secondary | ICD-10-CM | POA: Diagnosis not present

## 2017-06-03 DIAGNOSIS — T83098A Other mechanical complication of other indwelling urethral catheter, initial encounter: Secondary | ICD-10-CM | POA: Diagnosis not present

## 2017-06-03 DIAGNOSIS — N183 Chronic kidney disease, stage 3 (moderate): Secondary | ICD-10-CM | POA: Diagnosis not present

## 2017-06-03 DIAGNOSIS — R338 Other retention of urine: Secondary | ICD-10-CM | POA: Diagnosis not present

## 2017-06-03 DIAGNOSIS — R339 Retention of urine, unspecified: Secondary | ICD-10-CM | POA: Diagnosis not present

## 2017-06-03 NOTE — ED Provider Notes (Signed)
[facility] DEPT Provider Note   CSN: [id number] Arrival date & time: 06/03/17  1353     History   Chief Complaint Chief Complaint  Patient presents with  . blocked urinary catheter    HPI [name] is a 66 y.o. male with a history of BPH, obstructive uropathy, CKD3, who presents today for evaluation of clogged foley.  He has been using a foley for the past 5 plus years.  He reports he has issues with fine sediment in his foley clogging it.  He reports that around 6am he woke up and realized he didn't have much urine in his bag.  He reports he tried flushing the catheter with out significant improvement and then removed the catheter.  He reports he uses a normal foley size 16.  He reports significant discomfort in his lower abdomen, and is unable to empty his bladder.    HPI  Past Medical History:  Diagnosis Date  . Anemia    normal Fe, nl B12, nl retic, nl EPO July '13  . Blood transfusion without reported diagnosis   . BPH (benign prostatic hyperplasia)   . Chronic back pain   . Chronic kidney disease    CKD III, obstructive nephropathy  . Diverticulosis   . Dysuria   . Elevated PSA, greater than or equal to 20 ng/ml June '13   PSA 107  . Hemorrhoids, internal, with bleeding, prolapse 09/19/2014  . Hyperlipidemia 05/29/2014  . Hypertension   . Obstructive uropathy 11/27/2015  . Renal insufficiency   . Tuberculosis    h/o PPD +  . UTI (urinary tract infection)     Patient Active Problem List   Diagnosis Date Noted  . Acute gouty arthritis 12/01/2016  . Degenerative arthritis of knee, bilateral 11/18/2016  . Urinary retention due to benign prostatic hyperplasia 10/24/2016  . Mass of right side of neck 10/20/2016  . Gout 10/20/2016  . Knee pain, acute 10/12/2016  . Hypokalemia 05/28/2016  . Encounter for well adult exam with abnormal findings 11/27/2015  . Obstructive uropathy 11/27/2015  . Elevated PSA 11/27/2015  . Acute  pyelonephritis 05/27/2015  . Generalized bloating 05/27/2015  . Constipation 05/27/2015  . Chest pain 05/27/2015  . AKI (acute kidney injury) ([place]) 05/27/2015  . Acute sinus infection 11/22/2014  . Cough 09/25/2014  . Hemorrhoids, internal, with bleeding, prolapse 09/19/2014  . 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 Hyperlipidemia 05/29/2014  . Lumbar radiculopathy 05/23/2014  . Lumbar stenosis 11/20/2013  . Abnormal glucose 11/06/2013  . Unspecified hereditary and idiopathic peripheral neuropathy 01/22/2013  . 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 CKD (chronic kidney disease) stage 3, GFR 30-59 ml/min (HCC) 11/24/2011  . Hematuria 11/24/2011  . Hydronephrosis 11/24/2011  . Anemia 11/24/2011  . BRADYCARDIA 02/05/2010  . TINEA PEDIS 01/29/2010  . 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 PERIPHERAL EDEMA 01/29/2010  . ABNORMAL ELECTROCARDIOGRAM 01/29/2010  . 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  . Osteoarthrosis, generalized, 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  . ONYCHOMYCOSIS, TOENAILS 10/02/2007  . BPH (benign prostatic hyperplasia) 10/02/2007  . Essential hypertension 03/06/2007    Past Surgical History:  Procedure Laterality Date  . CARPAL TUNNEL RELEASE Right   . COLONOSCOPY    . HEMORRHOID BANDING    . PROSTATE SURGERY    . SPLENECTOMY         Home Medications    Prior to Admission medications   Medication Sig Start Date End Date Taking? Authorizing Provider  acetaminophen (TYLENOL) 325 MG tablet Take  2 tablets (650 mg total) by mouth every 6 (six) hours as needed. 05/23/15   Varney Biles, MD  allopurinol (ZYLOPRIM) 100 MG tablet Take 1 tablet (100 mg total) by mouth daily. 10/20/16   Biagio Borg, MD  amLODipine (NORVASC) 5 MG tablet Take 1 tablet (5 mg total) by mouth daily. 10/12/16   Plotnikov, Evie Lacks, MD  aspirin 81 MG tablet Take 81 mg by mouth daily.    [provider]  atorvastatin (LIPITOR) 40 MG tablet Take 1 tablet (40 mg total) by mouth  daily. Patient taking differently: Take 40 mg by mouth daily as needed [facility] he feels is needed).  10/20/16   Biagio Borg, MD  azithromycin [facility]) 250 MG tablet As directed 04/23/17   Laurey Morale, MD  colchicine 0.6 MG tablet Take 1 tablet (0.6 mg total) by mouth daily. 12/10/16   Biagio Borg, MD  metoprolol tartrate (LOPRESSOR) 25 MG tablet Take 1 tablet (25 mg total) by mouth 2 (two) times daily. 12/29/16   Biagio Borg, MD  oxybutynin (DITROPAN) 5 MG tablet Take 1 tablet (5 mg total) by mouth daily. Patient taking differently: Take 5 mg by mouth daily as needed for bladder spasms.  05/29/14   Biagio Borg, MD  tamsulosin (FLOMAX) 0.4 MG CAPS capsule Take 1 capsule 2 (two) times daily by mouth. 04/11/17   Nickie Retort, MD    Family History Family History  Problem Relation Age of Onset  . Diabetes Mother   . Stroke Brother   . Hearing loss Brother   . Colon cancer Neg Hx   . Rectal cancer Neg Hx   . Stomach cancer Neg Hx   . Esophageal cancer Neg Hx     Social History Social History   Tobacco Use  . Smoking status: Former Smoker    Types: Cigarettes  . Smokeless tobacco: Never Used  . Tobacco comment: Quit 1981  Substance Use Topics  . Alcohol use: No    Alcohol/week: 0.0 oz    Comment: Quit 2004  . Drug use: No     Allergies   Patient has no known allergies.   Review of Systems Review of Systems  Constitutional: Negative for chills and fever.  HENT: Negative for ear pain and sore throat.   Eyes: Negative for pain and visual disturbance.  Respiratory: Negative for cough and shortness of breath.   Cardiovascular: Negative for chest pain and palpitations.  Gastrointestinal: Negative for abdominal pain and vomiting.  Genitourinary: Positive for decreased urine volume and difficulty urinating. Negative for dysuria and hematuria.       Unable to urinate.   Musculoskeletal: Negative for arthralgias and back pain.  Skin: Negative for color change and  rash.  Neurological: Negative for seizures and syncope.  All other systems reviewed and are negative.    Physical Exam Updated Vital Signs BP (!) 156/103 (BP Location: Right Arm)   Pulse 72   Temp 98 F (36.7 C) (Oral)   Resp 18   SpO2 100%   Physical Exam  Constitutional: He is oriented to person, place, and time. He appears well-developed and well-nourished. No distress.  HENT:  Head: Normocephalic and atraumatic.  Eyes: Conjunctivae are normal. Right eye exhibits no discharge. Left eye exhibits no discharge. No scleral icterus.  Neck: Normal range of motion.  Cardiovascular: Normal rate and regular rhythm.  Pulmonary/Chest: Effort normal. No stridor. No respiratory distress.  Abdominal: Soft. Bowel sounds are normal. He exhibits no  distension. There is tenderness in the suprapubic area.  Genitourinary: Penis normal.  Musculoskeletal: He exhibits no edema or deformity.  Neurological: He is alert and oriented to person, place, and time. He exhibits normal muscle tone.  Skin: Skin is warm and dry. He is not diaphoretic.  Psychiatric: He has a normal mood and affect. His behavior is normal.  Nursing note and vitals reviewed.    ED Treatments / Results  Labs (all labs ordered are listed, but only abnormal results are displayed) Labs Reviewed - No data to display  EKG  EKG Interpretation None       Radiology No results found.  Procedures Procedures (including critical care time)  Medications Ordered in ED Medications - No data to display   Initial Impression / Assessment and Plan / ED Course  I have reviewed the triage vital signs and the nursing notes.  Pertinent labs & imaging results that were available during my care of the patient were reviewed by me and considered in my medical decision making (see chart for details).  Clinical Course as of Jun 04 249  Fri Jun 03, 2017  1909 In room, bladder scan over 800 ml.  NT placing foley.   [EH]  1913 Foley  placed, urine draining.   [EH]  1958 Patient re-evaluated, reports all symptoms resolved. 877ml urine in bag  [EH]    Clinical Course User Index [EH] Lorin Glass, PA-C   Patient presents today for concern of blocked urinary catheter which she removed prior to arrival.  Bladder scan was performed showing over 800 mL of urine.  Foley catheter was placed which had about 800 mL urine.  After this patient was observed, and his abdominal discomfort fully resolved.  He did follow-up with his nephrologist and urologist to see if there are ways to prevent this, as this is the second time within approximately 1 month that this has happened.  Patient discharged home.   Final Clinical Impressions(s) / ED Diagnoses   Final diagnoses:  Problem with Foley catheter, initial encounter [facility])  Acute urinary retention    ED Discharge Orders    None       Ollen Gross 06/04/17 0252    Dorie Rank, MD 06/04/17 [id number]

## 2017-06-03 NOTE — ED Triage Notes (Addendum)
Pt c/o blocked urinary catheter. Pt states he noticed sediment in the urine. Pt attempted to flush catheter but no improvement. Pt c/o feeling distended in abdomen and states he has only had approximately 50 ml output since last night and feels that he is not able to empty his bladder.

## 2017-06-03 NOTE — Discharge Instructions (Signed)
Please follow up with your doctors to see if there is any thing that can be done to reduce the sediment.

## 2017-06-03 NOTE — ED Triage Notes (Signed)
Pt called,no answer.

## 2017-06-06 ENCOUNTER — Ambulatory Visit: Payer: Medicare Other | Admitting: Family Medicine

## 2017-06-06 DIAGNOSIS — Z87891 Personal history of nicotine dependence: Secondary | ICD-10-CM | POA: Insufficient documentation

## 2017-06-06 DIAGNOSIS — N183 Chronic kidney disease, stage 3 (moderate): Secondary | ICD-10-CM | POA: Insufficient documentation

## 2017-06-06 DIAGNOSIS — Z79899 Other long term (current) drug therapy: Secondary | ICD-10-CM | POA: Insufficient documentation

## 2017-06-06 DIAGNOSIS — R05 Cough: Secondary | ICD-10-CM | POA: Diagnosis not present

## 2017-06-06 DIAGNOSIS — I129 Hypertensive chronic kidney disease with stage 1 through stage 4 chronic kidney disease, or unspecified chronic kidney disease: Secondary | ICD-10-CM | POA: Diagnosis not present

## 2017-06-06 DIAGNOSIS — J069 Acute upper respiratory infection, unspecified: Secondary | ICD-10-CM | POA: Diagnosis not present

## 2017-06-06 DIAGNOSIS — Z7982 Long term (current) use of aspirin: Secondary | ICD-10-CM | POA: Insufficient documentation

## 2017-06-07 ENCOUNTER — Encounter (HOSPITAL_COMMUNITY): Payer: Self-pay

## 2017-06-07 ENCOUNTER — Emergency Department (HOSPITAL_COMMUNITY): Payer: Medicare Other

## 2017-06-07 ENCOUNTER — Emergency Department (HOSPITAL_COMMUNITY)
Admission: EM | Admit: 2017-06-07 | Discharge: 2017-06-07 | Disposition: A | Discharge status: Home / Self Care | Payer: Medicare Other | Attending: Emergency Medicine | Admitting: Emergency Medicine

## 2017-06-07 DIAGNOSIS — R05 Cough: Secondary | ICD-10-CM

## 2017-06-07 DIAGNOSIS — J069 Acute upper respiratory infection, unspecified: Secondary | ICD-10-CM

## 2017-06-07 DIAGNOSIS — R059 Cough, unspecified: Secondary | ICD-10-CM

## 2017-06-07 MED ORDER — FLUTICASONE PROPIONATE 50 MCG/ACT NA SUSP: 1.0000 | Freq: Every day | NASAL | 2 refills | Status: DC

## 2017-06-07 MED ORDER — BENZONATATE 100 MG PO CAPS: 100.0000 mg | ORAL_CAPSULE | Freq: Three times a day (TID) | ORAL | 0 refills | Status: DC

## 2017-06-07 NOTE — ED Notes (Signed)
Bed: [facility] Expected date:  Expected time:  Means of arrival:  Comments: 

## 2017-06-07 NOTE — Discharge Instructions (Signed)
Take Tessalon perles as directed.   Use Flonase as needed for congestion.   You can use Coricidin cough medication. That is safe in blood pressure.   Follow-up with your primary care doctor in the next 24-48 hours for further evaluation.   Return to the Emergency Departmetn for any worsening cough, fever, vomiting, chest pain or any other worsening or concerning symptoms.

## 2017-06-07 NOTE — ED Triage Notes (Signed)
Pt complains of flulike sx for two days He states that he has a dry cough and a headache

## 2017-06-07 NOTE — ED Provider Notes (Signed)
[facility] DEPT Provider Note   CSN: [id number] Arrival date & time: 06/06/17  2222     History   Chief Complaint Chief Complaint  Patient presents with  . URI    HPI [name] is a 67 y.o. male resents for evaluation of 2 days of nasal congestion, rhinorrhea, cough.  Patient reports that cough is intermittently productive of yellow sputum.  Sometimes it is a dry cough.  He states that he has tried Tylenol and over-the-counter Delsym with no improvement in cough.  He states that he is having some secondary chest soreness only when he coughs.  Denies any chest pain at rest.  Patient reports he has not tried any other medications.  Patient denies any fevers, chest pain, difficulty breathing, vomiting, abdominal pain.  The history is provided by the patient.    Past Medical History:  Diagnosis Date  . Anemia    normal Fe, nl B12, nl retic, nl EPO July '13  . Blood transfusion without reported diagnosis   . BPH (benign prostatic hyperplasia)   . Chronic back pain   . Chronic kidney disease    CKD III, obstructive nephropathy  . Diverticulosis   . Dysuria   . Elevated PSA, greater than or equal to 20 ng/ml June '13   PSA 107  . Hemorrhoids, internal, with bleeding, prolapse 09/19/2014  . Hyperlipidemia 05/29/2014  . Hypertension   . Obstructive uropathy 11/27/2015  . Renal insufficiency   . Tuberculosis    h/o PPD +  . UTI (urinary tract infection)     Patient Active Problem List   Diagnosis Date Noted  . Acute gouty arthritis 12/01/2016  . Degenerative arthritis of knee, bilateral 11/18/2016  . Urinary retention due to benign prostatic hyperplasia 10/24/2016  . Mass of right side of neck 10/20/2016  . Gout 10/20/2016  . Knee pain, acute 10/12/2016  . Hypokalemia 05/28/2016  . Encounter for well adult exam with abnormal findings 11/27/2015  . Obstructive uropathy 11/27/2015  . Elevated PSA 11/27/2015  . Acute pyelonephritis  05/27/2015  . Generalized bloating 05/27/2015  . Constipation 05/27/2015  . Chest pain 05/27/2015  . AKI (acute kidney injury) ([place]) 05/27/2015  . Acute sinus infection 11/22/2014  . Cough 09/25/2014  . Hemorrhoids, internal, with bleeding, prolapse 09/19/2014  . 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 Hyperlipidemia 05/29/2014  . Lumbar radiculopathy 05/23/2014  . Lumbar stenosis 11/20/2013  . Abnormal glucose 11/06/2013  . Unspecified hereditary and idiopathic peripheral neuropathy 01/22/2013  . Cardiomyopathy due to hypertension ([place]) 12/28/2011  . CKD (chronic kidney disease) stage 3, GFR 30-59 ml/min (HCC) 11/24/2011  . Hematuria 11/24/2011  . Hydronephrosis 11/24/2011  . Anemia 11/24/2011  . BRADYCARDIA 02/05/2010  . TINEA PEDIS 01/29/2010  . 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 PERIPHERAL EDEMA 01/29/2010  . ABNORMAL ELECTROCARDIOGRAM 01/29/2010  . 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  . Osteoarthrosis, generalized, 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  . ONYCHOMYCOSIS, TOENAILS 10/02/2007  . BPH (benign prostatic hyperplasia) 10/02/2007  . Essential hypertension 03/06/2007    Past Surgical History:  Procedure Laterality Date  . CARPAL TUNNEL RELEASE Right   . COLONOSCOPY    . HEMORRHOID BANDING    . PROSTATE SURGERY    . SPLENECTOMY         Home Medications    Prior to Admission medications   Medication Sig Start Date End Date Taking? Authorizing Provider  acetaminophen (TYLENOL) 325 MG tablet Take 2 tablets (650 mg total) by mouth every 6 (six) hours as  needed. 05/23/15   Varney Biles, MD  allopurinol (ZYLOPRIM) 100 MG tablet Take 1 tablet (100 mg total) by mouth daily. 10/20/16   Biagio Borg, MD  amLODipine (NORVASC) 5 MG tablet Take 1 tablet (5 mg total) by mouth daily. 10/12/16   Plotnikov, Evie Lacks, MD  aspirin 81 MG tablet Take 81 mg by mouth daily.    [provider]  atorvastatin (LIPITOR) 40 MG tablet Take 1 tablet (40 mg total) by mouth daily. Patient  taking differently: Take 40 mg by mouth daily as needed [facility] he feels is needed).  10/20/16   Biagio Borg, MD  azithromycin [facility]) 250 MG tablet As directed 04/23/17   Laurey Morale, MD  benzonatate (TESSALON) 100 MG capsule Take 1 capsule (100 mg total) by mouth every 8 (eight) hours. 06/07/17   Volanda Napoleon, PA-C  colchicine 0.6 MG tablet Take 1 tablet (0.6 mg total) by mouth daily. 12/10/16   Biagio Borg, MD  fluticasone (FLONASE) 50 MCG/ACT nasal spray Place 1 spray into both nostrils daily. 06/07/17   Volanda Napoleon, PA-C  metoprolol tartrate (LOPRESSOR) 25 MG tablet Take 1 tablet (25 mg total) by mouth 2 (two) times daily. 12/29/16   Biagio Borg, MD  oxybutynin (DITROPAN) 5 MG tablet Take 1 tablet (5 mg total) by mouth daily. Patient taking differently: Take 5 mg by mouth daily as needed for bladder spasms.  05/29/14   Biagio Borg, MD  tamsulosin (FLOMAX) 0.4 MG CAPS capsule Take 1 capsule 2 (two) times daily by mouth. 04/11/17   Nickie Retort, MD    Family History Family History  Problem Relation Age of Onset  . Diabetes Mother   . Stroke Brother   . Hearing loss Brother   . Colon cancer Neg Hx   . Rectal cancer Neg Hx   . Stomach cancer Neg Hx   . Esophageal cancer Neg Hx     Social History Social History   Tobacco Use  . Smoking status: Former Smoker    Types: Cigarettes  . Smokeless tobacco: Never Used  . Tobacco comment: Quit 1981  Substance Use Topics  . Alcohol use: No    Alcohol/week: 0.0 oz    Comment: Quit 2004  . Drug use: No     Allergies   Patient has no known allergies.   Review of Systems Review of Systems  Constitutional: Negative for fever.  HENT: Positive for congestion and rhinorrhea.   Respiratory: Positive for cough. Negative for shortness of breath.   Cardiovascular: Negative for chest pain.  Gastrointestinal: Negative for abdominal pain, nausea and vomiting.     Physical Exam Updated Vital Signs BP (!) 157/106 (BP  Location: Left Arm)   Pulse 74   Temp 98.9 F (37.2 C) (Oral)   Resp 18   Wt 115.7 kg (255 lb)   SpO2 96%   BMI 34.58 kg/m   Physical Exam  Constitutional: He appears well-developed and well-nourished.  HENT:  Head: Normocephalic and atraumatic.  Nose: Mucosal edema and rhinorrhea present.  Eyes: Conjunctivae and EOM are normal. Right eye exhibits no discharge. Left eye exhibits no discharge. No scleral icterus.  Pulmonary/Chest: Effort normal and breath sounds normal.  No evidence of respiratory distress. Able to speak in full sentences without difficulty.  Neurological: He is alert.  Skin: Skin is warm and dry.  Psychiatric: He has a normal mood and affect. His speech is normal and behavior is normal.  Nursing note  and vitals reviewed.    ED Treatments / Results  Labs (all labs ordered are listed, but only abnormal results are displayed) Labs Reviewed - No data to display  EKG  EKG Interpretation None       Radiology Dg Chest 2 View  Result Date: 06/07/2017 CLINICAL DATA:  Acute onset of cough and congestion.  Headache. EXAM: CHEST  2 VIEW COMPARISON:  Chest radiograph performed 10/24/2016 FINDINGS: The lungs are well-aerated. Minimal left basilar scarring is noted. There is no evidence of focal opacification, pleural effusion or pneumothorax. The heart is normal in size; the mediastinal contour is within normal limits. No acute osseous abnormalities are seen. Scattered clips are noted about the upper abdomen. IMPRESSION: No acute cardiopulmonary process seen. Electronically Signed   By: Garald Balding M.D.   On: 06/07/2017 01:27    Procedures Procedures (including critical care time)  Medications Ordered in ED Medications - No data to display   Initial Impression / Assessment and Plan / ED Course  I have reviewed the triage vital signs and the nursing notes.  Pertinent labs & imaging results that were available during my care of the patient were reviewed by me  and considered in my medical decision making (see chart for details).     67 y.o. M who presents duration of 2-3 days of nasal congestion, rhinorrhea and cough.  Cough is intermittently productive with phlegm.  No fevers, vomiting, abdominal pain. Patient is afebrile, non-toxic appearing, sitting comfortably on examination table. Vital signs reviewed.  Patient is slightly hypertensive.  He has a baseline history of hypertension.  I suspect that it is secondary to chronic hypertension but also he has been taking over-the-counter cough medications and suspect that might be additionally raising his blood pressure.  He has a baseline history of hypertension.  Consider viral URI.  History/physical exam not concerning for PE.  Low suspicion for pneumonia given history/physical exam but given cough, will plan to check chest x-ray.  Chest x-ray reviewed.  Negative for any acute infectious etiology.  Discussed results with patient.  Instructed patient to stop taking over-the-counter cough medication list is Coricidin that does not interfere with his blood pressure.  We will plan to give Ladona Ridgel for symptomatic relief.  Patient instructed on at home conservative therapies.  Instructed patient to follow-up with primary care doctor the next 24-48 hours for further evaluation. Patient had ample opportunity for questions and discussion. All patient's questions were answered with full understanding. Strict return precautions discussed. Patient expresses understanding and agreement to plan.    Final Clinical Impressions(s) / ED Diagnoses   Final diagnoses:  Upper respiratory tract infection, unspecified type  Cough    ED Discharge Orders        Ordered    fluticasone (FLONASE) 50 MCG/ACT nasal spray  Daily     06/07/17 0137    benzonatate (TESSALON) 100 MG capsule  Every 8 hours     06/07/17 0137       Volanda Napoleon, PA-C 06/07/17 6734    Jola Schmidt, MD 06/07/17 [phone]

## 2017-06-27 ENCOUNTER — Encounter (HOSPITAL_COMMUNITY): Payer: Self-pay | Admitting: Nurse Practitioner

## 2017-06-27 ENCOUNTER — Emergency Department (HOSPITAL_COMMUNITY)
Admission: EM | Admit: 2017-06-27 | Discharge: 2017-06-27 | Disposition: A | Discharge status: Home / Self Care | Payer: Medicare Other | Attending: Emergency Medicine | Admitting: Emergency Medicine

## 2017-06-27 DIAGNOSIS — T83511A Infection and inflammatory reaction due to indwelling urethral catheter, initial encounter: Secondary | ICD-10-CM | POA: Diagnosis not present

## 2017-06-27 DIAGNOSIS — N390 Urinary tract infection, site not specified: Secondary | ICD-10-CM | POA: Diagnosis not present

## 2017-06-27 DIAGNOSIS — Z79899 Other long term (current) drug therapy: Secondary | ICD-10-CM | POA: Diagnosis not present

## 2017-06-27 DIAGNOSIS — Z7982 Long term (current) use of aspirin: Secondary | ICD-10-CM | POA: Insufficient documentation

## 2017-06-27 DIAGNOSIS — N183 Chronic kidney disease, stage 3 (moderate): Secondary | ICD-10-CM | POA: Insufficient documentation

## 2017-06-27 DIAGNOSIS — R1030 Lower abdominal pain, unspecified: Secondary | ICD-10-CM | POA: Insufficient documentation

## 2017-06-27 DIAGNOSIS — Y828 Other medical devices associated with adverse incidents: Secondary | ICD-10-CM | POA: Diagnosis not present

## 2017-06-27 DIAGNOSIS — I129 Hypertensive chronic kidney disease with stage 1 through stage 4 chronic kidney disease, or unspecified chronic kidney disease: Secondary | ICD-10-CM | POA: Insufficient documentation

## 2017-06-27 DIAGNOSIS — R339 Retention of urine, unspecified: Secondary | ICD-10-CM | POA: Diagnosis present

## 2017-06-27 DIAGNOSIS — Z87891 Personal history of nicotine dependence: Secondary | ICD-10-CM | POA: Insufficient documentation

## 2017-06-27 DIAGNOSIS — T83091A Other mechanical complication of indwelling urethral catheter, initial encounter: Secondary | ICD-10-CM

## 2017-06-27 LAB — URINALYSIS, ROUTINE W REFLEX MICROSCOPIC
Bilirubin Urine: NEGATIVE
GLUCOSE, UA: NEGATIVE mg/dL
KETONES UR: NEGATIVE mg/dL
NITRITE: POSITIVE — AB
PH: 9 — AB (ref 5.0–8.0)
Protein, ur: 100 mg/dL — AB
Specific Gravity, Urine: 1.01 (ref 1.005–1.030)
Squamous Epithelial / LPF: NONE SEEN

## 2017-06-27 MED ORDER — CEPHALEXIN 500 MG PO CAPS
1000.0000 mg | ORAL_CAPSULE | Freq: Once | ORAL | Status: AC
  Administered 2017-06-27: 1000 mg via ORAL
  Filled 2017-06-27: qty 2

## 2017-06-27 MED ORDER — CEPHALEXIN 500 MG PO CAPS: 500.0000 mg | ORAL_CAPSULE | Freq: Four times a day (QID) | ORAL | 0 refills | Status: DC

## 2017-06-27 NOTE — Progress Notes (Signed)
Patient complain of bladder pressure. Bladder scanned showed >337ml. Pt stated the last time he had urine output was last night when he removed his foley cath at 10 pm.

## 2017-06-27 NOTE — ED Provider Notes (Signed)
[name] DEPT Provider Note: Georgena Spurling, MD, FACEP  CSN: [id number] MRN: [id number] ARRIVAL: 06/27/17 at [place]: [place]  Foley Catheter Problem   HISTORY OF PRESENT ILLNESS  06/27/17 4:43 AM [name] is a 67 y.o. male who has had an indwelling Foley for several weeks due to urinary retention.  It was last changed on June 03, 2017.  He has a follow-up appointment with urology later this week.  He removed his own Foley yesterday due to the Foley not draining any urine draining around the Foley catheter.  He also reports crystals in the Foley catheter.  He has subsequently been unable to void except for the passage of a small amount of blood and clots.  Nursing staff did a bedside bladder scan and found him to have over 300 mL of retained urine.  He denies fever, chills, nausea, vomiting or diarrhea.  He rates his pain as a 10 out of 10 at its worst although it comes and goes.  Pain is exacerbated by attempting to urinate.   Past Medical History:  Diagnosis Date  . Anemia    normal Fe, nl B12, nl retic, nl EPO July '13  . Blood transfusion without reported diagnosis   . BPH (benign prostatic hyperplasia)   . Chronic back pain   . Chronic kidney disease    CKD III, obstructive nephropathy  . Diverticulosis   . Dysuria   . Elevated PSA, greater than or equal to 20 ng/ml June '13   PSA 107  . Hemorrhoids, internal, with bleeding, prolapse 09/19/2014  . Hyperlipidemia 05/29/2014  . Hypertension   . Obstructive uropathy 11/27/2015  . Renal insufficiency   . Tuberculosis    h/o PPD +  . UTI (urinary tract infection)     Past Surgical History:  Procedure Laterality Date  . CARPAL TUNNEL RELEASE Right   . COLONOSCOPY    . HEMORRHOID BANDING    . PROSTATE SURGERY    . SPLENECTOMY      Family History  Problem Relation Age of Onset  . Diabetes Mother   . Stroke Brother   . Hearing loss Brother   . Colon cancer Neg Hx   . Rectal cancer  Neg Hx   . Stomach cancer Neg Hx   . Esophageal cancer Neg Hx     Social History   Tobacco Use  . Smoking status: Former Smoker    Types: Cigarettes  . Smokeless tobacco: Never Used  . Tobacco comment: Quit 1981  Substance Use Topics  . Alcohol use: No    Alcohol/week: 0.0 oz    Comment: Quit 2004  . Drug use: No    Prior to Admission medications   Medication Sig Start Date End Date Taking? Authorizing Provider  acetaminophen (TYLENOL) 325 MG tablet Take 2 tablets (650 mg total) by mouth every 6 (six) hours as needed. Patient taking differently: Take 650 mg by mouth every 6 (six) hours as needed for mild pain, moderate pain or headache.  05/23/15  Yes Nanavati, Ankit, MD  amLODipine (NORVASC) 5 MG tablet Take 1 tablet (5 mg total) by mouth daily. 10/12/16  Yes Plotnikov, Evie Lacks, MD  aspirin 81 MG tablet Take 81 mg by mouth daily.   Yes [provider]  atorvastatin (LIPITOR) 40 MG tablet Take 1 tablet (40 mg total) by mouth daily. 10/20/16  Yes Biagio Borg, MD  colchicine 0.6 MG tablet Take 1 tablet (0.6 mg total) by mouth  daily. 12/10/16  Yes Biagio Borg, MD  fluticasone [facility]) 50 MCG/ACT nasal spray Place 1 spray into both nostrils daily. 06/07/17  Yes Providence Lanius A, PA-C  metoprolol tartrate (LOPRESSOR) 25 MG tablet Take 1 tablet (25 mg total) by mouth 2 (two) times daily. 12/29/16  Yes Biagio Borg, MD  oxybutynin (DITROPAN) 5 MG tablet Take 1 tablet (5 mg total) by mouth daily. Patient taking differently: Take 5 mg by mouth daily as needed for bladder spasms.  05/29/14  Yes Biagio Borg, MD  tamsulosin (FLOMAX) 0.4 MG CAPS capsule Take 0.4 mg by mouth 2 (two) times daily.  04/11/17  Yes Nickie Retort, MD  allopurinol (ZYLOPRIM) 100 MG tablet Take 1 tablet (100 mg total) by mouth daily. Patient not taking: Reported on 06/27/2017 10/20/16   Biagio Borg, MD  azithromycin [facility]) 250 MG tablet As directed Patient not taking: Reported on 06/27/2017 04/23/17    Laurey Morale, MD  benzonatate (TESSALON) 100 MG capsule Take 1 capsule (100 mg total) by mouth every 8 (eight) hours. Patient not taking: Reported on 06/27/2017 06/07/17   Volanda Napoleon, PA-C    Allergies Patient has no known allergies.   REVIEW OF SYSTEMS  Negative except as noted here or in the History of Present Illness.   PHYSICAL EXAMINATION  Initial Vital Signs Blood pressure (!) 134/102, pulse 77, temperature 98.2 F (36.8 C), temperature source Oral, resp. rate 20, SpO2 98 %.  Examination General: Well-developed, well-nourished male in no acute distress; appearance consistent with age of record HENT: normocephalic; atraumatic Eyes: pupils equal, round and reactive to light; extraocular muscles intact; arcus senilis bilaterally Neck: supple Heart: regular rate and rhythm Lungs: clear to auscultation bilaterally Abdomen: soft; distended, tender bladder; bowel sounds present GU: Tanner V male, uncircumcised Extremities: No deformity; full range of motion; pulses normal; trace edema of lower legs Neurologic: Awake, alert and oriented; motor function intact in all extremities and symmetric; no facial droop Skin: Warm and dry Psychiatric: Normal mood and affect   RESULTS  Summary of this visit's results, reviewed by myself:   EKG Interpretation  Date/Time:    Ventricular Rate:    PR Interval:    QRS Duration:   QT Interval:    QTC Calculation:   R Axis:     Text Interpretation:        Laboratory Studies: Results for orders placed or performed during the hospital encounter of 06/27/17 (from the past 24 hour(s))  Urinalysis, Routine w reflex microscopic     Status: Abnormal   Collection Time: 06/27/17  4:56 AM  Result Value Ref Range   Color, Urine YELLOW YELLOW   APPearance CLOUDY (A) CLEAR   Specific Gravity, Urine 1.010 1.005 - 1.030   pH 9.0 (H) 5.0 - 8.0   Glucose, UA NEGATIVE NEGATIVE mg/dL   Hgb urine dipstick LARGE (A) NEGATIVE   Bilirubin  Urine NEGATIVE NEGATIVE   Ketones, ur NEGATIVE NEGATIVE mg/dL   Protein, ur 100 (A) NEGATIVE mg/dL   Nitrite POSITIVE (A) NEGATIVE   Leukocytes, UA LARGE (A) NEGATIVE   RBC / HPF TOO NUMEROUS TO COUNT 0 - 5 RBC/hpf   WBC, UA TOO NUMEROUS TO COUNT 0 - 5 WBC/hpf   Bacteria, UA MANY (A) NONE SEEN   Squamous Epithelial / LPF NONE SEEN NONE SEEN   Amorphous Crystal PRESENT    Triple Phosphate Crystal PRESENT    Non Squamous Epithelial 0-5 (A) NONE SEEN   Imaging Studies:  No results found.  ED COURSE  Nursing notes and initial vitals signs, including pulse oximetry, reviewed.  Vitals:   06/27/17 0115 06/27/17 0500  BP: (!) 134/102 (!) 133/98  Pulse: 77 66  Resp: 20 16  Temp: 98.2 F (36.8 C)   TempSrc: Oral   SpO2: 98% 100%   4:56 AM Foley catheter placed by nursing staff.  450 mL of slightly cloudy amber urine obtained.  Patient had significant relief of discomfort.  5:58 AM Urinalysis consistent with urinary tract infection.  We will give Keflex for catheter associated UTI.  Urine sent for culture.  PROCEDURES    ED DIAGNOSES     ICD-10-CM   1. Obstructed Foley catheter, initial encounter ([place]) T83.091A   2. Urinary tract infection associated with indwelling urethral catheter, initial encounter [facility]) T83.511A    N39.0        Shanon Rosser, MD 06/27/17 [phone]

## 2017-06-27 NOTE — ED Triage Notes (Signed)
Pt states that his foley catheter has crystals in it. Adds that he has not been able to f/u with urology since it was changed here.

## 2017-06-28 ENCOUNTER — Encounter: Payer: Self-pay | Admitting: *Deleted

## 2017-06-28 ENCOUNTER — Other Ambulatory Visit: Payer: Self-pay | Admitting: *Deleted

## 2017-06-28 ENCOUNTER — Other Ambulatory Visit: Payer: Self-pay

## 2017-06-28 LAB — URINE CULTURE

## 2017-06-28 NOTE — Patient Outreach (Signed)
[facility]) Care Management  06/28/2017  Kenai Fluegel [birth year] [id number]   CSW was able to make initial contact with patient today to perform phone assessment, as well as assess and assist with social work needs and services.  CSW introduced self, explained role and types of services provided through [facility] ([place] [facility]).  CSW further explained to patient that CSW works with patient's [place], also with [facility] [facility], Lazaro Arms. CSW then explained the reason for the call, indicating that Mrs. Cheryll Cockayne thought that patient would benefit from social work services and resources to assist with getting new prescription glasses.  CSW obtained two HIPAA compliant identifiers from patient, which included patient's name and date of birth. Patient admits that he is unable to afford to buy himself new prescription glasses and that the last time he purchased prescription eye ware was roughly 15 years ago.  CSW provided patient with the following resources, in hopes that one of the agencies will be able to assist patient with obtaining prescription eye glasses: [place] [facility] [employer] [facility] Patient took down the contact information and agreed to begin calling these resources today. CSW was able to ensure that patient has the correct contact information for CSW, encouraging patient to contact CSW directly if none of these resources are able to assist him, or if additional social work needs arise in the near future.  Patient voiced understanding and was agreeable to this plan. CSW will perform a case closure on patient, as all goals of treatment have been met from social work standpoint and no additional social work needs have been identified at this time.  CSW will notify patient's [place] with [facility], Lazaro Arms of CSW's plans to close patient's  case.  CSW will fax an update to patient's Primary Care Physician, Dr. Cathlean Cower to ensure that they are aware of CSW's involvement with patient's plan of care.  CSW will submit a case closure request to Alycia Rossetti, Care Management Assistant with [place] [facility], in the form of an [employer].  CSW will ensure that Mrs. Arelia Sneddon is aware of Mrs. Cheryll Cockayne, [place] with [facility], continued involvement with patient's care. Nat Christen, BSW, MSW, LCSW  Licensed Education officer, [facility]  Mailing [place] [address] [place], [place] [address] Physical Address-[address], [place],  [address] Toll Free Main # [phone] Fax # [phone] Cell # [phone]  Office # [phone] [email or web address]

## 2017-06-28 NOTE — Patient Outreach (Signed)
[facility]) Care Management  06/28/2017  Prather Failla [birth year] [id number]   Telephone Screen Referral Date : 06/28/2017 Referral Source: [facility] ED Census Referral Reason: ED Utilization Insurance: [place] attempt # 1 To patient.   Social: The patient lives with a roommate. He states that his is doing ok but, has had some bouts with dizziness.   RN Health Coach asked if he had notified his physician and he stated no. He was advised to call and notify his PCP.  The patient verbalized understanding. He is able to perform his ADLS/IADLS. He states that he has had a fall three weeks ago his knees buckled  and he fell.  Bruising his hands no other injury. The patient states that he takes public transportation to his appointments. The patient has a rollator, cane and he wears eyeglasses. He stated that his glasses  are 67 years old and needs another pair but is unable to afford them. I explained to him that I would make a referral to social work to see if there were any resources to help him. The patient agreed.  Conditions:Per chart review the patient has HTN, CKD III, Anemia and BPH.  Medications: The patient states that he is on ten medications.  He is able to manage his medications.  He has no questions regarding them.  He does have problems paying for his colchicine 0.6 mg.  He states that his co pay is 90 dollars.  I notified him that I will make a referral to pharmacy to see if they can offer any help.  The patient agreed.  Appointments: The patient states that his next appointment with his primary care physician will be in march.  Consent/ Services: HIPAA verified with patient. Discussed and offered [facility] care management services. Patient verbally agreed to services.     Plan: RN Health Coach will send a successful letter to the patient with a pamphlet and magnet.  A referral will be placed to pharmacy for medication assistance. A referral will be placed to social  work for [employer].   Lazaro Arms RN, BSN, [place] Direct Dial:  [phone] Fax: [phone]

## 2017-06-30 ENCOUNTER — Telehealth: Payer: Self-pay | Admitting: Pharmacist

## 2017-06-30 ENCOUNTER — Encounter: Payer: Self-pay | Admitting: Internal Medicine

## 2017-06-30 ENCOUNTER — Other Ambulatory Visit (INDEPENDENT_AMBULATORY_CARE_PROVIDER_SITE_OTHER): Payer: Medicare Other

## 2017-06-30 ENCOUNTER — Telehealth: Payer: Self-pay

## 2017-06-30 ENCOUNTER — Ambulatory Visit (INDEPENDENT_AMBULATORY_CARE_PROVIDER_SITE_OTHER): Payer: Medicare Other | Admitting: Internal Medicine

## 2017-06-30 VITALS — BP 118/76 | HR 62 | Temp 97.7°F | Ht 72.0 in | Wt 248.0 lb

## 2017-06-30 DIAGNOSIS — E785 Hyperlipidemia, unspecified: Secondary | ICD-10-CM

## 2017-06-30 DIAGNOSIS — Z0001 Encounter for general adult medical examination with abnormal findings: Secondary | ICD-10-CM

## 2017-06-30 DIAGNOSIS — M545 Low back pain: Secondary | ICD-10-CM | POA: Diagnosis not present

## 2017-06-30 DIAGNOSIS — R42 Dizziness and giddiness: Secondary | ICD-10-CM | POA: Insufficient documentation

## 2017-06-30 DIAGNOSIS — M25561 Pain in right knee: Secondary | ICD-10-CM

## 2017-06-30 DIAGNOSIS — R7309 Other abnormal glucose: Secondary | ICD-10-CM

## 2017-06-30 DIAGNOSIS — N183 Chronic kidney disease, stage 3 unspecified: Secondary | ICD-10-CM

## 2017-06-30 DIAGNOSIS — G8929 Other chronic pain: Secondary | ICD-10-CM | POA: Diagnosis not present

## 2017-06-30 DIAGNOSIS — I10 Essential (primary) hypertension: Secondary | ICD-10-CM

## 2017-06-30 LAB — CBC WITH DIFFERENTIAL/PLATELET
BASOS PCT: 1 % (ref 0.0–3.0)
Basophils Absolute: 0.1 10*3/uL (ref 0.0–0.1)
EOS ABS: 0.1 10*3/uL (ref 0.0–0.7)
EOS PCT: 2.2 % (ref 0.0–5.0)
HEMATOCRIT: 40.7 % (ref 39.0–52.0)
HEMOGLOBIN: 13.4 g/dL (ref 13.0–17.0)
LYMPHS PCT: 53.9 % — AB (ref 12.0–46.0)
Lymphs Abs: 3.1 10*3/uL (ref 0.7–4.0)
MCHC: 33 g/dL (ref 30.0–36.0)
MCV: 86.5 fl (ref 78.0–100.0)
Monocytes Absolute: 0.5 10*3/uL (ref 0.1–1.0)
Monocytes Relative: 9.3 % (ref 3.0–12.0)
Neutro Abs: 1.9 10*3/uL (ref 1.4–7.7)
Neutrophils Relative %: 33.6 % — ABNORMAL LOW (ref 43.0–77.0)
Platelets: 363 10*3/uL (ref 150.0–400.0)
RBC: 4.71 Mil/uL (ref 4.22–5.81)
RDW: 14.8 % (ref 11.5–15.5)
WBC: 5.7 10*3/uL (ref 4.0–10.5)

## 2017-06-30 LAB — HEMOGLOBIN A1C: HEMOGLOBIN A1C: 5.9 % (ref 4.6–6.5)

## 2017-06-30 LAB — HEPATIC FUNCTION PANEL
ALK PHOS: 99 U/L (ref 39–117)
ALT: 16 U/L (ref 0–53)
AST: 18 U/L (ref 0–37)
Albumin: 3.7 g/dL (ref 3.5–5.2)
BILIRUBIN DIRECT: 0.2 mg/dL (ref 0.0–0.3)
TOTAL PROTEIN: 6.9 g/dL (ref 6.0–8.3)
Total Bilirubin: 0.7 mg/dL (ref 0.2–1.2)

## 2017-06-30 LAB — LIPID PANEL
CHOLESTEROL: 134 mg/dL (ref 0–200)
HDL: 52.9 mg/dL (ref >39.00)
LDL Cholesterol: 68 mg/dL (ref 0–99)
NONHDL: 81.27
Total CHOL/HDL Ratio: 3
Triglycerides: 67 mg/dL (ref 0.0–149.0)
VLDL: 13.4 mg/dL (ref 0.0–40.0)

## 2017-06-30 LAB — BASIC METABOLIC PANEL
BUN: 27 mg/dL — AB (ref 6–23)
CALCIUM: 8.8 mg/dL (ref 8.4–10.5)
CO2: 33 mEq/L — ABNORMAL HIGH (ref 19–32)
Chloride: 101 mEq/L (ref 96–112)
Creatinine, Ser: 2.15 mg/dL — ABNORMAL HIGH (ref 0.40–1.50)
GFR: 39.62 mL/min — ABNORMAL LOW (ref >60.00)
GLUCOSE: 77 mg/dL (ref 70–99)
POTASSIUM: 3.8 meq/L (ref 3.5–5.1)
Sodium: 140 mEq/L (ref 135–145)

## 2017-06-30 LAB — TSH: TSH: 1.82 u[IU]/mL (ref 0.35–4.50)

## 2017-06-30 LAB — VITAMIN D 25 HYDROXY (VIT D DEFICIENCY, FRACTURES): VITD: 18.6 ng/mL — AB (ref 30.00–100.00)

## 2017-06-30 LAB — PSA: PSA: 9.64 ng/mL — ABNORMAL HIGH (ref 0.10–4.00)

## 2017-06-30 NOTE — Progress Notes (Signed)
Subjective:    Patient ID: [name], male    DOB: Oct 29, 1950, 67 y.o.   MRN: [id number]  HPI  Here for wellness and f/u;  Overall doing ok;  Pt denies Chest pain, worsening SOB, DOE, wheezing, orthopnea, PND, worsening LE edema, palpitations.  Pt denies neurological change such as new headache, facial or extremity weakness.  Pt denies polydipsia, polyuria, or low sugar symptoms. Pt states overall good compliance with treatment and medications, good tolerability, and has been trying to follow appropriate diet.  Pt denies worsening depressive symptoms, suicidal ideation or panic. No fever, night sweats, wt loss, loss of appetite, or other constitutional symptoms.  Pt states good ability with ADL's, has low fall risk, home safety reviewed and adequate, no other significant changes in hearing or vision, and only occasionally active with exercise. Alo has onset mild intermittent dizziness lightheaded, taking po ok, no fever or syncope, seems worse after takes meds in the AM.  Nothing makes better except maybe lying down.   Denies urinary symptoms such as dysuria, frequency, urgency, flank pain, hematuria or n/v, fever, chills, now on 3/7 days cipro for UTI,  Leaving for [place] next mo to see family. Also Pt continues to have recurring LBP without change in severity, bowel or bladder change, fever, wt loss,  worsening LE pain/numbness/weakness, gait change or falls.  Also with right knee buckling and pain with walking more frequently in the past month with several near falls. Past Medical History:  Diagnosis Date  . Anemia    normal Fe, nl B12, nl retic, nl EPO July '13  . Blood transfusion without reported diagnosis   . BPH (benign prostatic hyperplasia)   . Chronic back pain   . Chronic kidney disease    CKD III, obstructive nephropathy  . Diverticulosis   . Dysuria   . Elevated PSA, greater than or equal to 20 ng/ml June '13   PSA 107  . Hemorrhoids, internal, with bleeding, prolapse 09/19/2014   . Hyperlipidemia 05/29/2014  . Hypertension   . Obstructive uropathy 11/27/2015  . Renal insufficiency   . Tuberculosis    h/o PPD +  . UTI (urinary tract infection)    Past Surgical History:  Procedure Laterality Date  . CARPAL TUNNEL RELEASE Right   . COLONOSCOPY    . HEMORRHOID BANDING    . PROSTATE SURGERY    . SPLENECTOMY      reports that he has quit smoking. His smoking use included cigarettes. he has never used smokeless tobacco. He reports that he does not drink alcohol or use drugs. family history includes Diabetes in his mother; Hearing loss in his brother; Stroke in his brother. No Known Allergies Current Outpatient Medications on File Prior to Visit  Medication Sig Dispense Refill  . acetaminophen (TYLENOL) 325 MG tablet Take 2 tablets (650 mg total) by mouth every 6 (six) hours as needed. (Patient taking differently: Take 650 mg by mouth every 6 (six) hours as needed for mild pain, moderate pain or headache. ) 12 tablet 0  . aspirin 81 MG tablet Take 81 mg by mouth daily.    Marland Kitchen atorvastatin (LIPITOR) 40 MG tablet Take 1 tablet (40 mg total) by mouth daily. 90 tablet 3  . cephALEXin (KEFLEX) 500 MG capsule Take 1 capsule (500 mg total) by mouth 4 (four) times daily. 28 capsule 0  . colchicine 0.6 MG tablet Take 1 tablet (0.6 mg total) by mouth daily. 30 tablet 11  . fluticasone (FLONASE) 50 MCG/ACT  nasal spray Place 1 spray into both nostrils daily. 16 g 2  . metoprolol tartrate (LOPRESSOR) 25 MG tablet Take 1 tablet (25 mg total) by mouth 2 (two) times daily. 180 tablet 2  . oxybutynin (DITROPAN) 5 MG tablet Take 1 tablet (5 mg total) by mouth daily. (Patient taking differently: Take 5 mg by mouth daily as needed for bladder spasms. ) 90 tablet 3  . tamsulosin (FLOMAX) 0.4 MG CAPS capsule Take 0.4 mg by mouth 2 (two) times daily.      No current facility-administered medications on file prior to visit.    Review of Systems Constitutional: Negative for other unusual  diaphoresis, sweats, appetite or weight changes HENT: Negative for other worsening hearing loss, ear pain, facial swelling, mouth sores or neck stiffness.   Eyes: Negative for other worsening pain, redness or other visual disturbance.  Respiratory: Negative for other stridor or swelling Cardiovascular: Negative for other palpitations or other chest pain  Gastrointestinal: Negative for worsening diarrhea or loose stools, blood in stool, distention or other pain Genitourinary: Negative for hematuria, flank pain or other change in urine volume.  Musculoskeletal: Negative for myalgias or other joint swelling.  Skin: Negative for other color change, or other wound or worsening drainage.  Neurological: Negative for other syncope or numbness. Hematological: Negative for other adenopathy or swelling Psychiatric/Behavioral: Negative for hallucinations, other worsening agitation, SI, self-injury, or new decreased concentration All other system neg per pt    Objective:   Physical Exam BP 118/76   Pulse 62   Temp 97.7 F (36.5 C) (Oral)   Ht 6' (1.829 m)   Wt 248 lb (112.5 kg)   SpO2 99%   BMI 33.63 kg/m  VS noted,  Constitutional: Pt is oriented to person, place, and time. Appears well-developed and well-nourished, in no significant distress and comfortable Head: Normocephalic and atraumatic  Eyes: Conjunctivae and EOM are normal. Pupils are equal, round, and reactive to light Right Ear: External ear normal without discharge Left Ear: External ear normal without discharge Nose: Nose without discharge or deformity Mouth/Throat: Oropharynx is without other ulcerations and moist  Neck: Normal range of motion. Neck supple. No JVD present. No tracheal deviation present or significant neck LA or mass Cardiovascular: Normal rate, regular rhythm, normal heart sounds and intact distal pulses.   Pulmonary/Chest: WOB normal and breath sounds without rales or wheezing  Abdominal: Soft. Bowel sounds are  normal. NT. No HSM  Musculoskeletal: Normal range of motion. Lymphadenopathy: Has no other cervical adenopathy.  Neurological: Pt is alert and oriented to person, place, and time. Pt has normal reflexes. No cranial nerve deficit. Motor grossly intact, Gait intact Skin: Skin is warm and dry. No rash noted or new ulcerations Psychiatric:  Has normal mood and affect. Behavior is normal without agitation Trace LLE swelling No other exam findings  ECG done today I have personally interpreted: Sinus Bradycardia 53 with non specific ST T wave changes    Assessment & Plan:

## 2017-06-30 NOTE — Patient Outreach (Signed)
[facility]) Care Management  [place]   06/30/2017  [name] [birth year] [id number]  Subjective: Patient was called regarding medication assistance. HIPAA identifiers were obtained. Patient is a 67 year old male with multiple medical conditions including but not limited to: Gout, hypertension, degenerative arthritis of the knee, hyperlipidemia, lumbar stenosis and most recently urinary retention requiring a foley catheter.  Patient reported Colchicine had a copay of $140 when he tried to get it filled earlier this week.    Objective:   Encounter Medications: Outpatient Encounter Medications as of 06/30/2017  Medication Sig Note  . acetaminophen (TYLENOL) 325 MG tablet Take 2 tablets (650 mg total) by mouth every 6 (six) hours as needed. (Patient taking differently: Take 650 mg by mouth every 6 (six) hours as needed for mild pain, moderate pain or headache. )   . amLODipine (NORVASC) 5 MG tablet Take 1 tablet (5 mg total) by mouth daily.   Marland Kitchen aspirin 81 MG tablet Take 81 mg by mouth daily.   Marland Kitchen atorvastatin (LIPITOR) 40 MG tablet Take 1 tablet (40 mg total) by mouth daily.   . cephALEXin (KEFLEX) 500 MG capsule Take 1 capsule (500 mg total) by mouth 4 (four) times daily.   . colchicine 0.6 MG tablet Take 1 tablet (0.6 mg total) by mouth daily.   . fluticasone (FLONASE) 50 MCG/ACT nasal spray Place 1 spray into both nostrils daily.   . metoprolol tartrate (LOPRESSOR) 25 MG tablet Take 1 tablet (25 mg total) by mouth 2 (two) times daily.   Marland Kitchen oxybutynin (DITROPAN) 5 MG tablet Take 1 tablet (5 mg total) by mouth daily. (Patient taking differently: Take 5 mg by mouth daily as needed for bladder spasms. ) 04/21/2017: Only takes PRN  . tamsulosin (FLOMAX) 0.4 MG CAPS capsule Take 0.4 mg by mouth 2 (two) times daily.     No facility-administered encounter medications on file as of 06/30/2017.     Functional Status: In your present state of health, do you have any  difficulty performing the following activities: 06/28/2017 10/24/2016  Hearing? N -  Vision? Y -  Difficulty concentrating or making decisions? N -  Walking or climbing stairs? N -  Dressing or bathing? N -  Doing errands, shopping? N N  Preparing Food and eating ? N -  Using the Toilet? N -  In the past six months, have you accidently leaked urine? N -  Comment - -  Do you have problems with loss of bowel control? N -  Managing your Medications? N -  Managing your Finances? Y -  Housekeeping or managing your Housekeeping? N -  Some recent data might be hidden    Fall/Depression Screening: Fall Risk  06/28/2017 04/21/2017 04/18/2017  Falls in the past year? No No No  Number falls in past yr: - - -  Injury with Fall? - - -   PHQ 2/9 Scores 06/28/2017 06/28/2017 04/18/2017 10/20/2016 08/04/2016 11/27/2015 05/29/2014  PHQ - 2 Score 1 1 0 0 0 0 0      Assessment:  Drugs sorted by system:  Cardiovascular: Amlodipine Atorvastatin Metoprolol Aspirin  Urology Tamsulosin Oxybutynin  Miscellaneous: Allopurinol Colchicine  Pain: Acetaminophen  Infections Disease Cephalexin  Medication Assistance Findings Patient has AARP Medicare Complete Plan 2 His plan has a $95 deductible for tier 3-5 medications Colchicine is a tier 3 medication per his insurance company's formulary With the deuctible at $95 and the patient's regular copayment of $45 plus the $95 deductible  is how the price totaled $140. This was explained to the patient and he communicated understanding.  Plan: Close pharmacy case.  Elayne Guerin, PharmD, [place] Clinical Pharmacist [phone]        Plan:

## 2017-06-30 NOTE — Telephone Encounter (Signed)
-----   Message from Biagio Borg, MD sent at 06/30/2017  1:25 PM EST ----- Letter sent, cont same tx except  The test results show that your current treatment is OK, except the Vitamin D level is low.  This is important to avoid bone loss related to the kidneys.  Please start OTC  Vitamin D3 2000 units per day, and take indefinitely.  The PSA is much improved over 8 months ago.  All the other tests are acceptable as is.  I see no reason to put off travel to [place] as you have planned.  Sh[place] to please inform pt

## 2017-06-30 NOTE — Patient Instructions (Signed)
Your Blood Pressures and EKG were OK today  OK to stop the amlodipine 5 mg per day for now as this may be causing the dizziness  Please continue all other medications as before, and refills have been done if requested.  Please have the pharmacy call with any other refills you may need.  Please continue your efforts at being more active, low cholesterol diet, and weight control.  You are otherwise up to date with prevention measures today.  Please keep your appointments with your specialists as you may have planned - Dr Justin Mend for kidney  Please go to the LAB in the Basement (turn left off the elevator) for the tests to be done today  You will be contacted by phone if any changes need to be made immediately.  Otherwise, you will receive a letter about your results with an explanation, but please check with MyChart first.  Please remember to sign up for MyChart if you have not done so, as this will be important to you in the future with finding out test results, communicating by private email, and scheduling acute appointments online when needed.

## 2017-06-30 NOTE — Telephone Encounter (Signed)
Pt has been informed and expressed understanding.  

## 2017-07-03 DIAGNOSIS — M545 Low back pain, unspecified: Secondary | ICD-10-CM | POA: Insufficient documentation

## 2017-07-03 NOTE — Assessment & Plan Note (Signed)
Asympt, mild, for a1c with next labs

## 2017-07-03 NOTE — Assessment & Plan Note (Addendum)
Suspect due to overcontrolled HTN, for d/c amlodipine, follow symtpoms  In addition to the time spent performing CPE, I spent an additional 25 minutes face to face,in which greater than 50% of this time was spent in counseling and coordination of care for patient's acute illness as documented, including the differential dx, treatment, further evaluation and other management of dizziness, HTN, chronic lbp, right knee pain, CKD, and hyperglycemia

## 2017-07-03 NOTE — Assessment & Plan Note (Signed)

## 2017-07-03 NOTE — Assessment & Plan Note (Signed)
Now more chronic with recurrent buckling symptoms and near falls, for sports med referral

## 2017-07-03 NOTE — Assessment & Plan Note (Signed)
Chronic intermittent stable,  to f/u any worsening symptoms or concerns

## 2017-07-03 NOTE — Assessment & Plan Note (Signed)
stable overall by history and exam, recent data reviewed with pt, and pt to continue medical treatment as before,  to f/u any worsening symptoms or concerns Lab Results  Component Value Date   CREATININE 2.15 (H) 06/30/2017

## 2017-07-03 NOTE — Assessment & Plan Note (Signed)
Lab Results  Component Value Date   LDLCALC 68 06/30/2017   stable overall by history and exam, recent data reviewed with pt, and pt to continue medical treatment as before,  to f/u any worsening symptoms or concerns

## 2017-07-03 NOTE — Assessment & Plan Note (Signed)
stable overall by history and exam, recent data reviewed with pt, and pt to continue medical treatment as before,  to f/u any worsening symptoms or concerns BP Readings from Last 3 Encounters:  06/30/17 118/76  06/27/17 (!) 144/92  06/06/17 (!) 157/106

## 2017-07-06 NOTE — Progress Notes (Signed)
[name] [address], [place] [address] Phone: [phone] Subjective:     CC: Bilateral knee pain  FWY:[id number]  [name] is a 67 y.o. male coming in with complaint of known to have severe arthritis.  Had bilateral Visco supplementation in October.  Patient has had difficulty with his Foley catheter recently. His right knee is noticeably swollen and patient states that his knee has had been painful for the past 2-3 weeks. He feels like he is going to fall due to the pain.      Past Medical History:  Diagnosis Date  . Anemia    normal Fe, nl B12, nl retic, nl EPO July '13  . Blood transfusion without reported diagnosis   . BPH (benign prostatic hyperplasia)   . Chronic back pain   . Chronic kidney disease    CKD III, obstructive nephropathy  . Diverticulosis   . Dysuria   . Elevated PSA, greater than or equal to 20 ng/ml June '13   PSA 107  . Hemorrhoids, internal, with bleeding, prolapse 09/19/2014  . Hyperlipidemia 05/29/2014  . Hypertension   . Obstructive uropathy 11/27/2015  . Renal insufficiency   . Tuberculosis    h/o PPD +  . UTI (urinary tract infection)    Past Surgical History:  Procedure Laterality Date  . CARPAL TUNNEL RELEASE Right   . COLONOSCOPY    . HEMORRHOID BANDING    . PROSTATE SURGERY    . SPLENECTOMY     Social History   Socioeconomic History  . Marital status: Married    Spouse name: None  . Number of children: 2  . Years of education: 55  . Highest education level: None  Social Needs  . Financial resource strain: None  . Food insecurity - worry: None  . Food insecurity - inability: None  . Transportation needs - medical: None  . Transportation needs - non-medical: None  Occupational History  . Occupation: Lobbyist: [employer]    Comment: unemployed  Tobacco Use  . Smoking status: Former Smoker    Types: Cigarettes  . Smokeless tobacco: Never Used  . Tobacco  comment: Quit 1981  Substance and Sexual Activity  . Alcohol use: No    Alcohol/week: 0.0 oz    Comment: Quit 2004  . Drug use: No  . Sexual activity: No  Other Topics Concern  . None  Social History Narrative   Native of [place], raised poor farming community. . To [place] '78 - finished College, all but Arts administrator. Married - wife in [place], blocked from [place] to [place]. 1 son '90; 1 dtr '93. 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 Resources depleted.   Right handed.   Caffeine Hot daily one daily.   No Known Allergies Family History  Problem Relation Age of Onset  . Diabetes Mother   . Stroke Brother   . Hearing loss Brother   . Colon cancer Neg Hx   . Rectal cancer Neg Hx   . Stomach cancer Neg Hx   . Esophageal cancer Neg Hx      Past medical history, social, surgical and family history all reviewed in electronic medical record.  No pertanent information unless stated regarding to the chief complaint.   Review of Systems:Review of systems updated and as accurate  No headache, visual changes, nausea, vomiting, diarrhea, constipation, dizziness, abdominal pain, skin rash, fevers, chills, night sweats, weight loss,  swollen lymph nodes, body aches, joint swelling, muscle aches, chest pain, shortness of breath, mood changes.   Objective  Blood pressure 138/80, pulse (!) 56, height 6\' 1"  (1.854 m), weight 247 lb (112 kg), SpO2 98 %.   General: No apparent distress alert and oriented x3 mood and affect normal, dressed appropriately.  HEENT: Pupils equal, extraocular movements intact  Respiratory: Patient's speak in full sentences and does not appear short of breath  Cardiovascular: No lower extremity edema, non tender, no erythema  Skin: Warm dry intact with no signs of infection or rash on extremities or on axial skeleton.  Abdomen: Soft nontender  Neuro: Cranial nerves II through XII are intact, neurovascularly intact in all extremities with 2+  DTRs and 2+ pulses.  Lymph: No lymphadenopathy of posterior or anterior cervical chain or axillae bilaterally.  Gait normal with good balance and coordination.  MSK:  Non tender with full range of motion and good stability and symmetric strength and tone of shoulders, elbows, wrist, hip, and ankles bilaterally.  Knee: Bilateral valgus deformity noted. Large thigh to calf ratio.  Large effusion of the right compared to the contralateral side. Tender to palpation over medial and PF joint line.  Decreased range of motion of both significantly more on the right side with lack of 10 degrees of the final extension and only 90 degrees of flexion instability with valgus force.  painful patellar compression. Patellar glide with moderate crepitus. Patellar and quadriceps tendons unremarkable. Hamstring and quadriceps strength is normal.  Procedure: Real-time Ultrasound Guided Injection of right knee Device: GE Logiq Q7 Ultrasound guided injection is preferred based studies that show increased duration, increased effect, greater accuracy, decreased procedural pain, increased response rate, and decreased cost with ultrasound guided versus blind injection.  Verbal informed consent obtained.  Time-out conducted.  Noted no overlying erythema, induration, or other signs of local infection.  Skin prepped in a sterile fashion.  Local anesthesia: Topical Ethyl chloride.  With sterile technique and under real time ultrasound guidance: With a 22-gauge 2 inch needle patient was injected with 4 cc of 0.5% Marcaine and aspirated 60 cc of strawlike color fluid 1 cc of Kenalog 40 mg/dL. This was from a superior lateral approach.  Completed without difficulty  Pain immediately resolved suggesting accurate placement of the medication.  Advised to call if fevers/chills, erythema, induration, drainage, or persistent bleeding.  Images permanently stored and available for review in the ultrasound unit.  Impression:  Technically successful ultrasound guided injection.  Procedure: Real-time Ultrasound Guided Injection of left knee Device: GE Logiq Q7 Ultrasound guided injection is preferred based studies that show increased duration, increased effect, greater accuracy, decreased procedural pain, increased response rate, and decreased cost with ultrasound guided versus blind injection.  Verbal informed consent obtained.  Time-out conducted.  Noted no overlying erythema, induration, or other signs of local infection.  Skin prepped in a sterile fashion.  Local anesthesia: Topical Ethyl chloride.  With sterile technique and under real time ultrasound guidance: With a 22-gauge 2 inch needle patient was injected with 4 cc of 0.5% Marcaine and aspirated 20 cc of straw-colored fluid 1 cc of Kenalog 40 mg/dL. This was from a superior lateral approach.  Completed without difficulty  Pain immediately resolved suggesting accurate placement of the medication.  Advised to call if fevers/chills, erythema, induration, drainage, or persistent bleeding.  Images permanently stored and available for review in the ultrasound unit.  Impression: Technically successful ultrasound guided injection.   Impression and Recommendations:  This case required medical decision making of moderate complexity.      Note: This dictation was prepared with Dragon dictation along with smaller phrase technology. Any transcriptional errors that result from this process are unintentional.

## 2017-07-07 ENCOUNTER — Ambulatory Visit: Payer: Self-pay

## 2017-07-07 ENCOUNTER — Encounter: Payer: Self-pay | Admitting: Family Medicine

## 2017-07-07 ENCOUNTER — Ambulatory Visit: Payer: Medicare Other | Admitting: Family Medicine

## 2017-07-07 ENCOUNTER — Other Ambulatory Visit: Payer: Medicare Other

## 2017-07-07 VITALS — BP 138/80 | HR 56 | Ht 73.0 in | Wt 247.0 lb

## 2017-07-07 DIAGNOSIS — M109 Gout, unspecified: Secondary | ICD-10-CM

## 2017-07-07 DIAGNOSIS — M25561 Pain in right knee: Principal | ICD-10-CM

## 2017-07-07 DIAGNOSIS — M170 Bilateral primary osteoarthritis of knee: Secondary | ICD-10-CM

## 2017-07-07 DIAGNOSIS — G8929 Other chronic pain: Secondary | ICD-10-CM

## 2017-07-07 NOTE — Patient Instructions (Signed)
Good to see you  [name] is your friend.  Stay active.  See me again before you leave.

## 2017-07-08 LAB — SYNOVIAL CELL COUNT + DIFF, W/ CRYSTALS
BASOPHILS, %: 0 %
Eosinophils-Synovial: 0 % (ref 0–2)
Lymphocytes-Synovial Fld: 24 % (ref 0–74)
Monocyte/Macrophage: 24 % (ref 0–69)
NEUTROPHIL, SYNOVIAL: 52 % — AB (ref 0–24)
Synoviocytes, %: 0 % (ref 0–15)
WBC, Synovial: 185 cells/uL — ABNORMAL HIGH (ref <150)

## 2017-07-08 LAB — TIQ-NTM

## 2017-07-08 NOTE — Assessment & Plan Note (Signed)
Bilateral injections given today.  Tolerated the procedure well.  Possible gout attack based on the appearance of the fluid will be sent to lab discussed with patient again about diet.  With be careful with medication secondary to patient's comorbidities for gout.  History of severe kidney disease.  Patient will be following up in 3-4 weeks to see how patient is responding.

## 2017-07-20 ENCOUNTER — Ambulatory Visit: Payer: Self-pay | Admitting: *Deleted

## 2017-07-20 NOTE — Telephone Encounter (Signed)
Pt called stating that he has "bitterness in his mouth and he has no taste for food at all, he feels dizziness, his eyes are hurting,  pain in his forehead and his whole body feels weak"; nurse triage initiated and recommendations made to include pt going to ED; he states that he would like to be seen in the office tomorrow; pt offered and accepted appointment with Dr Cathlean Cower at [facility] 07/21/17 at 1420; pt verbalizes understanding.  Reason for Disposition . Patient sounds very sick or weak to the triager  Answer Assessment - Initial Assessment Questions 1. LOCATION: "Where does it hurt?"      Across his forehead 2. ONSET: "When did the headache start?" (Minutes, hours or days)      07/17/17 3. PATTERN: "Does the pain come and go, or has it been constant since it started?"     constant 4. SEVERITY: "How bad is the pain?" and "What does it keep you from doing?"  (e.g., Scale 1-10; mild, moderate, or severe)   - MILD (1-3): doesn't interfere with normal activities    - MODERATE (4-7): interferes with normal activities or awakens from sleep    - SEVERE (8-10): excruciating pain, unable to do any normal activities        Rated 8 out of 10 5. RECURRENT SYMPTOM: "Have you ever had headaches before?" If so, ask: "When was the last time?" and "What happened that time?"      no 6. CAUSE: "What do you think is causing the headache?"    unsure 7. MIGRAINE: "Have you been diagnosed with migraine headaches?" If so, ask: "Is this headache similar?"      no 8. HEAD INJURY: "Has there been any recent injury to the head?"      no 9. OTHER SYMPTOMS: "Do you have any other symptoms?" (fever, stiff neck, eye pain, sore throat, cold symptoms)     Dizziness, no appetite, weakness 10. PREGNANCY: "Is there any chance you are pregnant?" "When was your last menstrual period?"       n/a  Protocols used: HEADACHE-A-AH

## 2017-07-21 ENCOUNTER — Encounter: Payer: Self-pay | Admitting: Internal Medicine

## 2017-07-21 ENCOUNTER — Other Ambulatory Visit (INDEPENDENT_AMBULATORY_CARE_PROVIDER_SITE_OTHER): Payer: Medicare Other

## 2017-07-21 ENCOUNTER — Ambulatory Visit (INDEPENDENT_AMBULATORY_CARE_PROVIDER_SITE_OTHER): Payer: Medicare Other | Admitting: Internal Medicine

## 2017-07-21 VITALS — BP 116/80 | HR 83 | Temp 98.9°F | Ht 73.0 in | Wt 245.0 lb

## 2017-07-21 DIAGNOSIS — N390 Urinary tract infection, site not specified: Secondary | ICD-10-CM

## 2017-07-21 DIAGNOSIS — I10 Essential (primary) hypertension: Secondary | ICD-10-CM | POA: Diagnosis not present

## 2017-07-21 DIAGNOSIS — N183 Chronic kidney disease, stage 3 unspecified: Secondary | ICD-10-CM

## 2017-07-21 LAB — URINALYSIS, ROUTINE W REFLEX MICROSCOPIC
Ketones, ur: NEGATIVE
Nitrite: NEGATIVE
Specific Gravity, Urine: 1.02
Total Protein, Urine: 100 — AB
Urine Glucose: NEGATIVE
Urobilinogen, UA: 0.2
pH: 6 (ref 5.0–8.0)

## 2017-07-21 MED ORDER — CEFTRIAXONE SODIUM 1 G IJ SOLR
1.0000 g | Freq: Once | INTRAMUSCULAR | Status: AC
  Administered 2017-07-21: 1 g via INTRAMUSCULAR

## 2017-07-21 MED ORDER — LEVOFLOXACIN 500 MG PO TABS: 500.0000 mg | ORAL_TABLET | Freq: Every day | ORAL | 0 refills | Status: AC

## 2017-07-21 NOTE — Progress Notes (Signed)
Subjective:    Patient ID: [name], male    DOB: 1951-01-01, 67 y.o.   MRN: [id number]  HPI  Here with c/o being ill x 2 days but o/w somewhat vague, with numerous symptoms including urinary frequency with weakness and dizziness, headache with crown soreness, no appetite and assoc with bloating, as well as back pain without n/v or diarrhea; has long hx of urinary obstruction, sees urology regularly and does self cath every 3-4 hrs.  Pt denies other chest pain, increased sob or doe, wheezing, orthopnea, PND, increased LE swelling, palpitations, dizziness or syncope.   Pt denies polydipsia, polyuria,  Past Medical History:  Diagnosis Date  . Anemia    normal Fe, nl B12, nl retic, nl EPO July '13  . Blood transfusion without reported diagnosis   . BPH (benign prostatic hyperplasia)   . Chronic back pain   . Chronic kidney disease    CKD III, obstructive nephropathy  . Diverticulosis   . Dysuria   . Elevated PSA, greater than or equal to 20 ng/ml June '13   PSA 107  . Hemorrhoids, internal, with bleeding, prolapse 09/19/2014  . Hyperlipidemia 05/29/2014  . Hypertension   . Obstructive uropathy 11/27/2015  . Renal insufficiency   . Tuberculosis    h/o PPD +  . UTI (urinary tract infection)    Past Surgical History:  Procedure Laterality Date  . CARPAL TUNNEL RELEASE Right   . COLONOSCOPY    . HEMORRHOID BANDING    . PROSTATE SURGERY    . SPLENECTOMY      reports that he has quit smoking. His smoking use included cigarettes. he has never used smokeless tobacco. He reports that he does not drink alcohol or use drugs. family history includes Diabetes in his mother; Hearing loss in his brother; Stroke in his brother. No Known Allergies Current Outpatient Medications on File Prior to Visit  Medication Sig Dispense Refill  . acetaminophen (TYLENOL) 325 MG tablet Take 2 tablets (650 mg total) by mouth every 6 (six) hours as needed. (Patient taking differently: Take 650 mg by mouth  every 6 (six) hours as needed for mild pain, moderate pain or headache. ) 12 tablet 0  . aspirin 81 MG tablet Take 81 mg by mouth daily.    Marland Kitchen atorvastatin (LIPITOR) 40 MG tablet Take 1 tablet (40 mg total) by mouth daily. 90 tablet 3  . cephALEXin (KEFLEX) 500 MG capsule Take 1 capsule (500 mg total) by mouth 4 (four) times daily. 28 capsule 0  . colchicine 0.6 MG tablet Take 1 tablet (0.6 mg total) by mouth daily. 30 tablet 11  . fluticasone (FLONASE) 50 MCG/ACT nasal spray Place 1 spray into both nostrils daily. 16 g 2  . metoprolol tartrate (LOPRESSOR) 25 MG tablet Take 1 tablet (25 mg total) by mouth 2 (two) times daily. 180 tablet 2  . oxybutynin (DITROPAN) 5 MG tablet Take 1 tablet (5 mg total) by mouth daily. (Patient taking differently: Take 5 mg by mouth daily as needed for bladder spasms. ) 90 tablet 3  . tamsulosin (FLOMAX) 0.4 MG CAPS capsule Take 0.4 mg by mouth 2 (two) times daily.     . Vitamin D, Cholecalciferol, 1000 units CAPS Take 2,000 Units by mouth.     No current facility-administered medications on file prior to visit.    Review of Systems  Constitutional: Negative for other unusual diaphoresis or sweats HENT: Negative for ear discharge or swelling Eyes: Negative for other worsening visual  disturbances Respiratory: Negative for stridor or other swelling  Gastrointestinal: Negative for worsening distension or other blood Genitourinary: Negative for retention or other urinary change Musculoskeletal: Negative for other MSK pain or swelling Skin: Negative for color change or other new lesions Neurological: Negative for worsening tremors and other numbness  Psychiatric/Behavioral: Negative for worsening agitation or other fatigue All other system neg per pt    Objective:   Physical Exam BP 116/80   Pulse 83   Temp 98.9 F (37.2 C) (Oral)   Ht 6\' 1"  (1.854 m)   Wt 245 lb (111.1 kg)   SpO2 99%   BMI 32.32 kg/m  VS noted, mild ill, fatigued, general  weakness Constitutional: Pt appears in NAD HENT: Head: NCAT.  Right Ear: External ear normal.  Left Ear: External ear normal.  Eyes: . Pupils are equal, round, and reactive to light. Conjunctivae and EOM are normal Nose: without d/c or deformity Neck: Neck supple. Gross normal ROM Cardiovascular: Normal rate and regular rhythm.   Pulmonary/Chest: Effort normal and breath sounds without rales or wheezing.  Abd:  Soft, ND, + BS, no organomegaly with low mid abd tender, without guarding or rebound, and no flank tender Neurological: Pt is alert. At baseline orientation Skin: Skin is warm. No rashes, other new lesions, no LE edema Psychiatric: Pt behavior is normal without agitation  No other exam findings    Assessment & Plan:

## 2017-07-21 NOTE — Patient Instructions (Signed)
You had the antibiotic shot today  Please take all new medication as prescribed - the levaquin pills (antibiotic)  Please continue all other medications as before, and refills have been done if requested.  Please have the pharmacy call with any other refills you may need.  Please keep your appointments with your specialists as you may have planned  Please go to the LAB in the Basement (turn left off the elevator) for the tests to be done today  You will be contacted by phone if any changes need to be made immediately.  Otherwise, you will receive a letter about your results with an explanation, but please check with MyChart first.  Please remember to sign up for MyChart if you have not done so, as this will be important to you in the future with finding out test results, communicating by private email, and scheduling acute appointments online when needed.

## 2017-07-22 DIAGNOSIS — N3000 Acute cystitis without hematuria: Secondary | ICD-10-CM | POA: Diagnosis not present

## 2017-07-22 DIAGNOSIS — N3001 Acute cystitis with hematuria: Secondary | ICD-10-CM | POA: Diagnosis not present

## 2017-07-23 DIAGNOSIS — N390 Urinary tract infection, site not specified: Secondary | ICD-10-CM | POA: Insufficient documentation

## 2017-07-23 NOTE — Assessment & Plan Note (Signed)
Declines further f/u lab today,  Lab Results  Component Value Date   CREATININE 2.15 (H) 06/30/2017  stable overall by history and exam, recent data reviewed with pt, and pt to continue medical treatment as before,  to f/u any worsening symptoms or concerns

## 2017-07-23 NOTE — Assessment & Plan Note (Signed)
stable overall by history and exam, recent data reviewed with pt, and pt to continue medical treatment as before,  to f/u any worsening symptoms or concerns BP Readings from Last 3 Encounters:  07/21/17 116/80  07/07/17 138/80  06/30/17 118/76

## 2017-07-23 NOTE — Assessment & Plan Note (Signed)
High suspicion for acute on chronic UTI related to daily mult self cath due to BPH and outlet obstruction, declines urology, for urine cx, for rocephin IM 1 gm, levaquin asd, and f/u cx results

## 2017-07-24 LAB — URINE CULTURE
MICRO NUMBER:: 90199582
SPECIMEN QUALITY:: ADEQUATE

## 2017-07-25 ENCOUNTER — Encounter: Payer: Self-pay | Admitting: Internal Medicine

## 2017-08-08 ENCOUNTER — Other Ambulatory Visit: Payer: Self-pay | Admitting: Internal Medicine

## 2017-08-22 DIAGNOSIS — I129 Hypertensive chronic kidney disease with stage 1 through stage 4 chronic kidney disease, or unspecified chronic kidney disease: Secondary | ICD-10-CM | POA: Diagnosis not present

## 2017-08-22 DIAGNOSIS — N2581 Secondary hyperparathyroidism of renal origin: Secondary | ICD-10-CM | POA: Diagnosis not present

## 2017-08-22 DIAGNOSIS — D631 Anemia in chronic kidney disease: Secondary | ICD-10-CM | POA: Diagnosis not present

## 2017-08-22 DIAGNOSIS — N184 Chronic kidney disease, stage 4 (severe): Secondary | ICD-10-CM | POA: Diagnosis not present

## 2017-08-22 DIAGNOSIS — N400 Enlarged prostate without lower urinary tract symptoms: Secondary | ICD-10-CM | POA: Diagnosis not present

## 2017-08-30 ENCOUNTER — Ambulatory Visit: Payer: Medicare Other | Admitting: Internal Medicine

## 2017-09-07 ENCOUNTER — Ambulatory Visit: Payer: Medicare Other | Admitting: Internal Medicine

## 2017-09-13 ENCOUNTER — Ambulatory Visit (INDEPENDENT_AMBULATORY_CARE_PROVIDER_SITE_OTHER): Payer: Medicare Other | Admitting: Internal Medicine

## 2017-09-13 ENCOUNTER — Encounter: Payer: Self-pay | Admitting: Internal Medicine

## 2017-09-13 VITALS — BP 128/86 | HR 63 | Temp 98.0°F | Ht 73.0 in | Wt 251.0 lb

## 2017-09-13 DIAGNOSIS — N183 Chronic kidney disease, stage 3 unspecified: Secondary | ICD-10-CM

## 2017-09-13 DIAGNOSIS — N390 Urinary tract infection, site not specified: Secondary | ICD-10-CM | POA: Diagnosis not present

## 2017-09-13 DIAGNOSIS — Z0000 Encounter for general adult medical examination without abnormal findings: Secondary | ICD-10-CM

## 2017-09-13 DIAGNOSIS — E559 Vitamin D deficiency, unspecified: Secondary | ICD-10-CM | POA: Diagnosis not present

## 2017-09-13 DIAGNOSIS — I10 Essential (primary) hypertension: Secondary | ICD-10-CM

## 2017-09-13 DIAGNOSIS — E785 Hyperlipidemia, unspecified: Secondary | ICD-10-CM

## 2017-09-13 DIAGNOSIS — R7309 Other abnormal glucose: Secondary | ICD-10-CM | POA: Diagnosis not present

## 2017-09-13 HISTORY — DX: Vitamin D deficiency, unspecified: E55.9

## 2017-09-13 NOTE — Progress Notes (Signed)
Subjective:    Patient ID: [name], male    DOB: 18-Apr-1951, 67 y.o.   MRN: [id number]  HPI  Here to f/u; overall doing ok,  Pt denies chest pain, increasing sob or doe, wheezing, orthopnea, PND, increased LE swelling, palpitations, dizziness or syncope.  Pt denies new neurological symptoms such as new headache, or facial or extremity weakness or numbness.  Pt denies polydipsia, polyuria, or low sugar episode.  Pt states overall good compliance with meds, mostly trying to follow appropriate diet, with wt overall stable,   S/p UTI tx feb 2019. For acute on chronic UTi, followed by urology.  Also had moderate right elbow bursitis last wk, now improved without specific tx. Denies urinary symptoms such as dysuria, frequency, urgency, flank pain, hematuria or n/v, fever, chills. No other interval hx or new complaint Past Medical History:  Diagnosis Date  . Anemia    normal Fe, nl B12, nl retic, nl EPO July '13  . Blood transfusion without reported diagnosis   . BPH (benign prostatic hyperplasia)   . Chronic back pain   . Chronic kidney disease    CKD III, obstructive nephropathy  . Diverticulosis   . Dysuria   . Elevated PSA, greater than or equal to 20 ng/ml June '13   PSA 107  . Hemorrhoids, internal, with bleeding, prolapse 09/19/2014  . Hyperlipidemia 05/29/2014  . Hypertension   . Obstructive uropathy 11/27/2015  . Renal insufficiency   . Tuberculosis    h/o PPD +  . UTI (urinary tract infection)   . Vitamin D deficiency 09/13/2017   Past Surgical History:  Procedure Laterality Date  . CARPAL TUNNEL RELEASE Right   . COLONOSCOPY    . HEMORRHOID BANDING    . PROSTATE SURGERY    . SPLENECTOMY      reports that he has quit smoking. His smoking use included cigarettes. He has never used smokeless tobacco. He reports that he does not drink alcohol or use drugs. family history includes Diabetes in his mother; Hearing loss in his brother; Stroke in his brother. No Known  Allergies Current Outpatient Medications on File Prior to Visit  Medication Sig Dispense Refill  . acetaminophen (TYLENOL) 325 MG tablet Take 2 tablets (650 mg total) by mouth every 6 (six) hours as needed. (Patient taking differently: Take 650 mg by mouth every 6 (six) hours as needed for mild pain, moderate pain or headache. ) 12 tablet 0  . aspirin 81 MG tablet Take 81 mg by mouth daily.    Marland Kitchen atorvastatin (LIPITOR) 40 MG tablet Take 1 tablet (40 mg total) by mouth daily. 90 tablet 3  . cephALEXin (KEFLEX) 500 MG capsule Take 1 capsule (500 mg total) by mouth 4 (four) times daily. 28 capsule 0  . colchicine 0.6 MG tablet Take 1 tablet (0.6 mg total) by mouth daily. 30 tablet 11  . finasteride (PROSCAR) 5 MG tablet TAKE ONE TABLET BY MOUTH ONCE DAILY 90 tablet 0  . fluticasone (FLONASE) 50 MCG/ACT nasal spray Place 1 spray into both nostrils daily. 16 g 2  . metoprolol tartrate (LOPRESSOR) 25 MG tablet Take 1 tablet (25 mg total) by mouth 2 (two) times daily. 180 tablet 2  . oxybutynin (DITROPAN) 5 MG tablet Take 1 tablet (5 mg total) by mouth daily. (Patient taking differently: Take 5 mg by mouth daily as needed for bladder spasms. ) 90 tablet 3  . tamsulosin (FLOMAX) 0.4 MG CAPS capsule Take 0.4 mg by mouth 2 (two)  times daily.     . Vitamin D, Cholecalciferol, 1000 units CAPS Take 2,000 Units by mouth.     No current facility-administered medications on file prior to visit.    Review of Systems  Constitutional: Negative for other unusual diaphoresis or sweats HENT: Negative for ear discharge or swelling Eyes: Negative for other worsening visual disturbances Respiratory: Negative for stridor or other swelling  Gastrointestinal: Negative for worsening distension or other blood Genitourinary: Negative for retention or other urinary change Musculoskeletal: Negative for other MSK pain or swelling Skin: Negative for color change or other new lesions Neurological: Negative for worsening  tremors and other numbness  Psychiatric/Behavioral: Negative for worsening agitation or other fatigue All other system neg per pt    Objective:   Physical Exam BP 128/86   Pulse 63   Temp 98 F (36.7 C) (Oral)   Ht 6\' 1"  (1.854 m)   Wt 251 lb (113.9 kg)   SpO2 95%   BMI 33.12 kg/m  VS noted,  Constitutional: Pt appears in NAD HENT: Head: NCAT.  Right Ear: External ear normal.  Left Ear: External ear normal.  Eyes: . Pupils are equal, round, and reactive to light. Conjunctivae and EOM are normal Nose: without d/c or deformity Neck: Neck supple. Gross normal ROM Cardiovascular: Normal rate and regular rhythm.   Pulmonary/Chest: Effort normal and breath sounds without rales or wheezing.  Neurological: Pt is alert. At baseline orientation, motor grossly intact Skin: Skin is warm. No rashes, other new lesions, no LE edema Psychiatric: Pt behavior is normal without agitation  Right elbow with mild swelling tender bursa type  No other exam findings    Assessment & Plan:

## 2017-09-13 NOTE — Patient Instructions (Signed)
Please continue all other medications as before, and refills have been done if requested.  Please have the pharmacy call with any other refills you may need.  Please continue your efforts at being more active, low cholesterol diet, and weight control.  You are otherwise up to date with prevention measures today.  Please keep your appointments with your specialists as you may have planned  Please return in 1 year for your yearly visit, or sooner if needed, with Lab testing done 3-5 days before  

## 2017-09-14 NOTE — Assessment & Plan Note (Signed)
To cont f/u with urology, now stable

## 2017-09-14 NOTE — Assessment & Plan Note (Signed)
stable overall by history and exam, recent data reviewed with pt, and pt to continue medical treatment as before,  to f/u any worsening symptoms or concerns Lab Results  Component Value Date   LDLCALC 68 06/30/2017

## 2017-09-14 NOTE — Assessment & Plan Note (Signed)
BP Readings from Last 3 Encounters:  09/13/17 128/86  07/21/17 116/80  07/07/17 138/80  stable overall by history and exam, recent data reviewed with pt, and pt to continue medical treatment as before,  to f/u any worsening symptoms or concerns

## 2017-09-14 NOTE — Assessment & Plan Note (Signed)
Lab Results  Component Value Date   HGBA1C 5.9 06/30/2017  stable overall by history and exam, recent data reviewed with pt, and pt to continue medical treatment as before,  to f/u any worsening symptoms or concerns

## 2017-09-14 NOTE — Assessment & Plan Note (Signed)
stable overall by history and exam, recent data reviewed with pt, and pt to continue medical treatment as before,  to f/u any worsening symptoms or concerns Lab Results  Component Value Date   CREATININE 2.15 (H) 06/30/2017

## 2017-09-14 NOTE — Assessment & Plan Note (Signed)
Also for vit d lab next visit

## 2017-11-02 ENCOUNTER — Other Ambulatory Visit: Payer: Self-pay | Admitting: Internal Medicine

## 2017-11-26 ENCOUNTER — Ambulatory Visit (INDEPENDENT_AMBULATORY_CARE_PROVIDER_SITE_OTHER): Payer: Medicare Other | Admitting: Family Medicine

## 2017-11-26 ENCOUNTER — Other Ambulatory Visit: Payer: Self-pay | Admitting: Internal Medicine

## 2017-11-26 ENCOUNTER — Encounter: Payer: Self-pay | Admitting: Family Medicine

## 2017-11-26 VITALS — BP 124/86 | HR 63 | Temp 97.9°F | Wt 244.0 lb

## 2017-11-26 DIAGNOSIS — N390 Urinary tract infection, site not specified: Secondary | ICD-10-CM

## 2017-11-26 DIAGNOSIS — R3914 Feeling of incomplete bladder emptying: Secondary | ICD-10-CM

## 2017-11-26 DIAGNOSIS — N401 Enlarged prostate with lower urinary tract symptoms: Secondary | ICD-10-CM | POA: Diagnosis not present

## 2017-11-26 DIAGNOSIS — R310 Gross hematuria: Secondary | ICD-10-CM | POA: Diagnosis not present

## 2017-11-26 DIAGNOSIS — N3001 Acute cystitis with hematuria: Secondary | ICD-10-CM

## 2017-11-26 LAB — POC URINALSYSI DIPSTICK (AUTOMATED)
Bilirubin, UA: NEGATIVE
GLUCOSE UA: NEGATIVE
Ketones, UA: NEGATIVE
Leukocytes, UA: NEGATIVE
Nitrite, UA: NEGATIVE
Protein, UA: POSITIVE — AB
SPEC GRAV UA: 1.02 (ref 1.010–1.025)
UROBILINOGEN UA: 0.2 U/dL
pH, UA: 6 (ref 5.0–8.0)

## 2017-11-26 MED ORDER — LEVOFLOXACIN 250 MG PO TABS: 250.0000 mg | ORAL_TABLET | Freq: Every day | ORAL | 0 refills | Status: DC

## 2017-11-26 NOTE — Progress Notes (Signed)
OFFICE VISIT  11/26/2017   CC:  Chief Complaint  Patient presents with  . Hematuria    x 1 day...denies flank pain... denies chills/fever denies abd pain   HPI:    Patient is a 67 y.o. African-American male with BPH with outlet obstruction who presents for blood in urine. He must cath himself in order to empty bladder. Noted blood in urine yesterday.  He did feel a bit of pain while doing cath that he doesn't usually feel. Continues to note blood today.  He feels some unusual (new) pain/discomfort in prostate gland region between scrotum and anus.  No fever.  No f/c/malaise. Has long hx of recurrent UTIs. Most recent about 4 mo ago and antibiotics helped all sx's resolve.  Past Medical History:  Diagnosis Date  . Anemia    normal Fe, nl B12, nl retic, nl EPO July '13  . Blood transfusion without reported diagnosis   . BPH (benign prostatic hyperplasia)   . Chronic back pain   . Chronic kidney disease    CKD III, obstructive nephropathy  . Diverticulosis   . Dysuria   . Elevated PSA, greater than or equal to 20 ng/ml June '13   PSA 107  . Hemorrhoids, internal, with bleeding, prolapse 09/19/2014  . Hyperlipidemia 05/29/2014  . Hypertension   . Obstructive uropathy 11/27/2015  . Renal insufficiency   . Tuberculosis    h/o PPD +  . UTI (urinary tract infection)   . Vitamin D deficiency 09/13/2017    Past Surgical History:  Procedure Laterality Date  . CARPAL TUNNEL RELEASE Right   . COLONOSCOPY    . HEMORRHOID BANDING    . PROSTATE SURGERY    . SPLENECTOMY      Outpatient Medications Prior to Visit  Medication Sig Dispense Refill  . acetaminophen (TYLENOL) 325 MG tablet Take 2 tablets (650 mg total) by mouth every 6 (six) hours as needed. (Patient taking differently: Take 650 mg by mouth every 6 (six) hours as needed for mild pain, moderate pain or headache. ) 12 tablet 0  . allopurinol (ZYLOPRIM) 100 MG tablet TAKE 1 TABLET BY MOUTH ONCE DAILY 90 tablet 3  . aspirin  81 MG tablet Take 81 mg by mouth daily.    Marland Kitchen atorvastatin (LIPITOR) 40 MG tablet Take 1 tablet (40 mg total) by mouth daily. 90 tablet 3  . colchicine 0.6 MG tablet Take 1 tablet (0.6 mg total) by mouth daily. 30 tablet 11  . finasteride (PROSCAR) 5 MG tablet TAKE ONE TABLET BY MOUTH ONCE DAILY 90 tablet 0  . fluticasone (FLONASE) 50 MCG/ACT nasal spray Place 1 spray into both nostrils daily. 16 g 2  . metoprolol tartrate (LOPRESSOR) 25 MG tablet Take 1 tablet (25 mg total) by mouth 2 (two) times daily. 180 tablet 2  . oxybutynin (DITROPAN) 5 MG tablet Take 1 tablet (5 mg total) by mouth daily. (Patient taking differently: Take 5 mg by mouth daily as needed for bladder spasms. ) 90 tablet 3  . tamsulosin (FLOMAX) 0.4 MG CAPS capsule Take 0.4 mg by mouth 2 (two) times daily.     . Vitamin D, Cholecalciferol, 1000 units CAPS Take 2,000 Units by mouth.    . cephALEXin (KEFLEX) 500 MG capsule Take 1 capsule (500 mg total) by mouth 4 (four) times daily. 28 capsule 0   No facility-administered medications prior to visit.     No Known Allergies  ROS As per HPI  PE: Blood pressure 124/86, pulse 63,  temperature 97.9 F (36.6 C), temperature source Oral, weight 244 lb (110.7 kg), SpO2 97 %. Gen: Alert, well appearing.  Patient is oriented to person, place, time, and situation. AFFECT: pleasant, lucid thought and speech. CV: RRR, no m/r/g.   LUNGS: CTA bilat, nonlabored resps, good aeration in all lung fields. ABD: soft, ND/NT.  BACK: no CVA or flank tenderness.  LABS:  Cath UA: blood, otherwise normal.  IMPRESSION AND PLAN:  Acute on chronic UTI:  Urine clx 07/21/17 showed pseudomonas and all his sx's resolved with 10d course of levaquin 250mg  qd. I rx'd him the same course of abx today.  An After Visit Summary was printed and given to the patient.  FOLLOW UP: Return if symptoms worsen or fail to improve.  Signed:  Crissie Sickles, MD           11/26/2017

## 2018-01-09 ENCOUNTER — Other Ambulatory Visit: Payer: Self-pay | Admitting: Internal Medicine

## 2018-01-24 ENCOUNTER — Emergency Department (HOSPITAL_COMMUNITY)
Admission: EM | Admit: 2018-01-24 | Discharge: 2018-01-24 | Disposition: A | Discharge status: Home / Self Care | Payer: Medicare Other | Attending: Emergency Medicine | Admitting: Emergency Medicine

## 2018-01-24 ENCOUNTER — Encounter (HOSPITAL_COMMUNITY): Payer: Self-pay | Admitting: Student

## 2018-01-24 ENCOUNTER — Emergency Department (HOSPITAL_COMMUNITY): Payer: Medicare Other

## 2018-01-24 DIAGNOSIS — I129 Hypertensive chronic kidney disease with stage 1 through stage 4 chronic kidney disease, or unspecified chronic kidney disease: Secondary | ICD-10-CM | POA: Diagnosis not present

## 2018-01-24 DIAGNOSIS — N183 Chronic kidney disease, stage 3 (moderate): Secondary | ICD-10-CM | POA: Insufficient documentation

## 2018-01-24 DIAGNOSIS — M109 Gout, unspecified: Secondary | ICD-10-CM | POA: Diagnosis not present

## 2018-01-24 DIAGNOSIS — M10061 Idiopathic gout, right knee: Secondary | ICD-10-CM | POA: Diagnosis not present

## 2018-01-24 DIAGNOSIS — M25561 Pain in right knee: Secondary | ICD-10-CM | POA: Diagnosis not present

## 2018-01-24 DIAGNOSIS — R509 Fever, unspecified: Secondary | ICD-10-CM | POA: Diagnosis not present

## 2018-01-24 DIAGNOSIS — Z87891 Personal history of nicotine dependence: Secondary | ICD-10-CM | POA: Diagnosis not present

## 2018-01-24 DIAGNOSIS — Z7982 Long term (current) use of aspirin: Secondary | ICD-10-CM | POA: Insufficient documentation

## 2018-01-24 DIAGNOSIS — M7989 Other specified soft tissue disorders: Secondary | ICD-10-CM | POA: Diagnosis not present

## 2018-01-24 DIAGNOSIS — S80919A Unspecified superficial injury of unspecified knee, initial encounter: Secondary | ICD-10-CM | POA: Diagnosis not present

## 2018-01-24 DIAGNOSIS — Z79899 Other long term (current) drug therapy: Secondary | ICD-10-CM | POA: Diagnosis not present

## 2018-01-24 DIAGNOSIS — I10 Essential (primary) hypertension: Secondary | ICD-10-CM | POA: Diagnosis not present

## 2018-01-24 MED ORDER — PREDNISONE 20 MG PO TABS
60.0000 mg | ORAL_TABLET | Freq: Once | ORAL | Status: AC
  Administered 2018-01-24: 60 mg via ORAL
  Filled 2018-01-24: qty 3

## 2018-01-24 MED ORDER — OXYCODONE-ACETAMINOPHEN 5-325 MG PO TABS: 1.0000 | ORAL_TABLET | Freq: Four times a day (QID) | ORAL | 0 refills | Status: DC | PRN

## 2018-01-24 MED ORDER — PREDNISONE 50 MG PO TABS: 50.0000 mg | ORAL_TABLET | Freq: Every day | ORAL | 0 refills | Status: DC

## 2018-01-24 MED ORDER — LIDOCAINE-EPINEPHRINE (PF) 2 %-1:200000 IJ SOLN
10.0000 mL | Freq: Once | INTRAMUSCULAR | Status: AC
  Administered 2018-01-24: 10 mL
  Filled 2018-01-24: qty 20

## 2018-01-24 MED ORDER — OXYCODONE-ACETAMINOPHEN 5-325 MG PO TABS
1.0000 | ORAL_TABLET | Freq: Once | ORAL | Status: AC
  Administered 2018-01-24: 1 via ORAL
  Filled 2018-01-24: qty 1

## 2018-01-24 NOTE — ED Triage Notes (Signed)
Transported by [facility] from home--right knee pain/swelling x 2-3 days, hx of gout. Pain 10/10. Area is warm to touch.

## 2018-01-24 NOTE — Discharge Instructions (Addendum)
You are seen in the emergency department today for pain to your right knee.  It appears that you have a flare of your gout.  Your x-ray did not show fractures or dislocations, it did show fluid around the knee and osteoarthritis.  We drained a portion of this fluid in the emergency department.  We also gave you this is of prednisone and Percocet which we are sending you home with.  You do not need to take prednisone today, take this the first time tomorrow.   Prednisone is a steroid to help treat the inflammation and pain.  Please take this daily for the next 5 days. Percocet is a narcotic/controlled substance medication that has potential addicting qualities.  We recommend that you take 1-2 tablets every 6 hours as needed for severe pain.  Do not drive or operate heavy machinery when taking this medicine as it can be sedating. Do not drink alcohol or take other sedating medications when taking this medicine for safety reasons.  Keep this out of reach of small children.  Please be aware this medicine has Tylenol in it (325 mg/tab) do not exceed the maximum dose of Tylenol in a day per over the counter recommendations should you decide to supplement with Tylenol over the counter.   We have prescribed you new medication(s) today. Discuss the medications prescribed today with your pharmacist as they can have adverse effects and interactions with your other medicines including over the counter and prescribed medications. Seek medical evaluation if you start to experience new or abnormal symptoms after taking one of these medicines, seek care immediately if you start to experience difficulty breathing, feeling of your throat closing, facial swelling, or rash as these could be indications of a more serious allergic reaction  We placed a wrap around your knee, please keep this applied for the next 24-48 hours. Please place ice 20 minutes on 40 minutes off over the bandage for the 24-48 hours. If the wrap feels too  tight you may loosen it, if it feels that your foot is numb/tingling or blue take the wrap off and come back to the Er.    We would like you to follow-up with your primary care provider  or orthopedist for reevaluation in 3 to 5 days.  Return to ER for new or worsening symptoms including but not limited to worsening pain, worsening swelling, fever, redness around the area, warmth, or any other concerns that you may have.

## 2018-01-24 NOTE — ED Provider Notes (Signed)
[facility] DEPT Provider Note   CSN: [id number] Arrival date & time: 01/24/18  1056   History   Chief Complaint Chief Complaint  Patient presents with  . Joint Swelling    HPI [name] is a 67 y.o. male with a history of hyperlipidemia, BPH, stage III CKD, ITP, and gout who presents to the emergency department with acute on chronic right knee pain for the past 3 days.  Patient states he has some baseline discomfort and swelling to the right knee which intermittently flares.  He states that current flare has been going on for 3 days, pain and associated swelling are constant, worse with attempts at movement. Feels it is stiff and he cannot move it much.  He reports this feels similar to previous gout flares.  He states that he recently had some purine rich food which he believes triggered this event.  Pain is gone to the point that he feels he is unable to walk on the leg.  Denies fever, erythema, nausea, vomiting, numbness, weakness, or recent surgical procedures or injuries to the knee. Patient taking Allopurinol for prevention-taking as prescribed.  He states that 3 years ago he had a flare in his right knee which he was seen in the emergency department for, he is unsure what medication they gave him, but it felt much better.  HPI  Past Medical History:  Diagnosis Date  . Anemia    normal Fe, nl B12, nl retic, nl EPO July '13  . Blood transfusion without reported diagnosis   . BPH (benign prostatic hyperplasia)   . Chronic back pain   . Chronic kidney disease    CKD III, obstructive nephropathy  . Diverticulosis   . Dysuria   . Elevated PSA, greater than or equal to 20 ng/ml June '13   PSA 107  . Hemorrhoids, internal, with bleeding, prolapse 09/19/2014  . Hyperlipidemia 05/29/2014  . Hypertension   . Obstructive uropathy 11/27/2015  . Renal insufficiency   . Tuberculosis    h/o PPD +  . UTI (urinary tract infection)   . Vitamin D deficiency  09/13/2017    Patient Active Problem List   Diagnosis Date Noted  . Vitamin D deficiency 09/13/2017  . Urinary tract infection without hematuria 07/23/2017  . Low back pain 07/03/2017  . Dizziness 06/30/2017  . Acute gouty arthritis 12/01/2016  . Degenerative arthritis of knee, bilateral 11/18/2016  . Urinary retention due to benign prostatic hyperplasia 10/24/2016  . Mass of right side of neck 10/20/2016  . Gout 10/20/2016  . Knee pain, acute 10/12/2016  . Hypokalemia 05/28/2016  . Encounter for well adult exam with abnormal findings 11/27/2015  . Obstructive uropathy 11/27/2015  . Elevated PSA 11/27/2015  . Acute pyelonephritis 05/27/2015  . Generalized bloating 05/27/2015  . Constipation 05/27/2015  . Chest pain 05/27/2015  . AKI (acute kidney injury) ([place]) 05/27/2015  . Acute sinus infection 11/22/2014  . Cough 09/25/2014  . Hemorrhoids, internal, with bleeding, prolapse 09/19/2014  . 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 Hyperlipidemia 05/29/2014  . Lumbar radiculopathy 05/23/2014  . Lumbar stenosis 11/20/2013  . Abnormal glucose 11/06/2013  . Unspecified hereditary and idiopathic peripheral neuropathy 01/22/2013  . Cardiomyopathy due to hypertension ([place]) 12/28/2011  . CKD (chronic kidney disease) stage 3, GFR 30-59 ml/min (HCC) 11/24/2011  . Hematuria 11/24/2011  . Hydronephrosis 11/24/2011  . Anemia 11/24/2011  . BRADYCARDIA 02/05/2010  . TINEA PEDIS 01/29/2010  . 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 PERIPHERAL EDEMA  01/29/2010  . ABNORMAL ELECTROCARDIOGRAM 01/29/2010  . 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  . Osteoarthrosis, generalized, 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  . ONYCHOMYCOSIS, TOENAILS 10/02/2007  . BPH (benign prostatic hyperplasia) 10/02/2007  . Essential hypertension 03/06/2007    Past Surgical History:  Procedure Laterality Date  . CARPAL TUNNEL RELEASE Right   . COLONOSCOPY    . HEMORRHOID BANDING    . PROSTATE SURGERY    . SPLENECTOMY           Home Medications    Prior to Admission medications   Medication Sig Start Date End Date Taking? Authorizing Provider  acetaminophen (TYLENOL) 325 MG tablet Take 2 tablets (650 mg total) by mouth every 6 (six) hours as needed. Patient taking differently: Take 650 mg by mouth every 6 (six) hours as needed for mild pain, moderate pain or headache.  05/23/15   Varney Biles, MD  allopurinol (ZYLOPRIM) 100 MG tablet TAKE 1 TABLET BY MOUTH ONCE DAILY 11/02/17   Biagio Borg, MD  aspirin 81 MG tablet Take 81 mg by mouth daily.    [provider]  atorvastatin (LIPITOR) 40 MG tablet TAKE 1 TABLET BY MOUTH ONCE DAILY 01/10/18   Biagio Borg, MD  colchicine 0.6 MG tablet Take 1 tablet (0.6 mg total) by mouth daily. 12/10/16   Biagio Borg, MD  finasteride (PROSCAR) 5 MG tablet TAKE ONE TABLET BY MOUTH ONCE DAILY 08/08/17   Biagio Borg, MD  fluticasone [facility]) 50 MCG/ACT nasal spray Place 1 spray into both nostrils daily. 06/07/17   Volanda Napoleon, PA-C  levofloxacin (LEVAQUIN) 250 MG tablet Take 1 tablet (250 mg total) by mouth daily. 11/26/17   McGowen, Adrian Blackwater, MD  metoprolol tartrate (LOPRESSOR) 25 MG tablet Take 1 tablet (25 mg total) by mouth 2 (two) times daily. 12/29/16   Biagio Borg, MD  oxybutynin (DITROPAN) 5 MG tablet Take 1 tablet (5 mg total) by mouth daily. Patient taking differently: Take 5 mg by mouth daily as needed for bladder spasms.  05/29/14   Biagio Borg, MD  tamsulosin (FLOMAX) 0.4 MG CAPS capsule Take 0.4 mg by mouth 2 (two) times daily.  04/11/17   Nickie Retort, MD  Vitamin D, Cholecalciferol, 1000 units CAPS Take 2,000 Units by mouth.    [provider]    Family History Family History  Problem Relation Age of Onset  . Diabetes Mother   . Stroke Brother   . Hearing loss Brother   . Colon cancer Neg Hx   . Rectal cancer Neg Hx   . Stomach cancer Neg Hx   . Esophageal cancer Neg Hx     Social History Social History   Tobacco  Use  . Smoking status: Former Smoker    Types: Cigarettes  . Smokeless tobacco: Never Used  . Tobacco comment: Quit 1981  Substance Use Topics  . Alcohol use: No    Alcohol/week: 0.0 standard drinks    Comment: Quit 2004  . Drug use: No     Allergies   Patient has no known allergies.   Review of Systems Review of Systems  Constitutional: Negative for chills and fever.  Musculoskeletal: Positive for arthralgias (R knee) and joint swelling (R knee).  Skin: Negative for wound.  Neurological: Negative for weakness and numbness.  All other systems reviewed and are negative.    Physical Exam Updated Vital Signs BP (!) 157/99   Pulse 69   Temp 98.9 F (37.2 C)   Resp 18  SpO2 96%   Physical Exam  Constitutional: He appears well-developed and well-nourished. No distress.  HENT:  Head: Normocephalic and atraumatic.  Eyes: Conjunctivae are normal. Right eye exhibits no discharge. Left eye exhibits no discharge.  Cardiovascular: Normal rate and regular rhythm.  Pulses:      Dorsalis pedis pulses are 2+ on the right side, and 2+ on the left side.       Posterior tibial pulses are 2+ on the right side, and 2+ on the left side.  Pulmonary/Chest: Effort normal and breath sounds normal.  Musculoskeletal:  Lower extremities: Patient has a fairly large joint effusion to the right knee on exam.  No overlying open wound, erythema, or increased warmth.  Patient has normal range of motion to all joints of the lower extremities with the exception of the right knee.  Patient is unable to fully flex or fully extend, he states this is secondary to pain.  He is able to do each of these motions somewhat.  His right knee is diffusely tender without point/focal bony tenderness.  Compartments are soft.  Calves are nontender.  Neurological: He is alert.  Clear speech. Sensation grossly intact to bilateral lower extremities. 5/5 strength with plantar/dorsiflexion bilaterally.   Skin: Skin is warm  and dry. Capillary refill takes less than 2 seconds.  Psychiatric: He has a normal mood and affect. His behavior is normal. Thought content normal.  Nursing note and vitals reviewed.  ED Treatments / Results  Labs (all labs ordered are listed, but only abnormal results are displayed) Labs Reviewed - No data to display  EKG None  Radiology Dg Knee Complete 4 Views Right  Result Date: 01/24/2018 CLINICAL DATA:  Knee pain and swelling EXAM: RIGHT KNEE - COMPLETE 4+ VIEW COMPARISON:  10/25/2016 FINDINGS: Tricompartmental degenerative change with joint space narrowing and spurring most severe in the lateral compartment. Large joint effusion. Possible Baker's cyst. Negative for fracture or loose body.  Fabella noted. IMPRESSION: Tricompartmental degenerative changes in the knee with large joint effusion. Electronically Signed   By: Franchot Gallo M.D.   On: 01/24/2018 11:32    Procedures .Joint Aspiration/Arthrocentesis Date/Time: 01/24/2018 1:30 PM Performed by: Amaryllis Dyke, PA-C Authorized by: Amaryllis Dyke, PA-C   Consent:    Consent obtained:  Verbal   Consent given by:  Patient   Risks discussed:  Bleeding, incomplete drainage, nerve damage, infection and pain   Alternatives discussed:  No treatment and alternative treatment Location:    Location:  Knee   Knee:  R knee Anesthesia (see MAR for exact dosages):    Anesthesia method:  Local infiltration   Local anesthetic:  Lidocaine 2% WITH epi Procedure details:    Preparation: Patient was prepped and draped in usual sterile fashion     Needle gauge:  18 G   Approach:  Lateral   Aspirate amount:  50ccs   Aspirate characteristics:  Yellow   Steroid injected: no     Specimen collected: no   Post-procedure details:    Dressing:  Adhesive bandage   Patient tolerance of procedure:  Tolerated well, no immediate complications Comments:     Ace wrap applied following procedure. Performed with supervising  physician Dr. Maryan Rued.    (including critical care time)  Medications Ordered in ED Medications  predniSONE (DELTASONE) tablet 60 mg (60 mg Oral Given 01/24/18 1229)  oxyCODONE-acetaminophen (PERCOCET/ROXICET) 5-325 MG per tablet 1 tablet (1 tablet Oral Given 01/24/18 1235)  lidocaine-EPINEPHrine (XYLOCAINE W/EPI) 2 %-1:200000 (PF) injection  10 mL (10 mLs Infiltration Given by Other 01/24/18 1235)    Initial Impression / Assessment and Plan / ED Course  I have reviewed the triage vital signs and the nursing notes.  Pertinent labs & imaging results that were available during my care of the patient were reviewed by me and considered in my medical decision making (see chart for details).   Patient presents to the emergency department with complaints of right knee pain/swelling for the past 3 days which he attributes to being a gout flare.  Patient has history of gout, on allopurinol preventatively, he did have synovial fluid analysis of the right knee performed in 2018 which confirmed intracellular monosodium urate crystals.  He does report having consumed fish which she believes triggered this flare.  There is no fever, overlying erythema, warmth, wounds, or recent surgical procedures to raise concern for septic joint.  No recent traumatic injuries.  X-ray obtained reveals tricompartmental degenerative changes in the knee with large joint effusion, consistent with exam. Given size of effusion and level of patient discomfort therapeutic arthrocentesis performed with supervising physician Dr. Maryan Rued, this was not meant for diagnostic purposes given hx of similar with prior confirmed gout with synovial fluid analysis.  Patient  neurovascularly intact distally prior to and following procedure. History of CKD, NSAIDs avoided.  Will treat with steroids and Percocet as this has worked well for patient in the past. [place] Controlled Substance reporting System queried/  I discussed results, treatment  plan, need for  follow-up, and return precautions with the patient. Provided opportunity for questions, patient confirmed understanding and is in agreement with plan.   Findings and plan of care discussed with supervising physician Dr. Maryan Rued who personally evaluated and examined this patient and is in agreement.    Final Clinical Impressions(s) / ED Diagnoses   Final diagnoses:  Acute gout of right knee, unspecified cause    ED Discharge Orders         Ordered    predniSONE (DELTASONE) 50 MG tablet  Daily with breakfast     01/24/18 1217    oxyCODONE-acetaminophen (PERCOCET/ROXICET) 5-325 MG tablet  Every 6 hours PRN     01/24/18 1217           Eliyahu Bille, Glynda Jaeger, PA-C 01/24/18 1508    Blanchie Dessert, MD 01/24/18 2114

## 2018-02-24 ENCOUNTER — Other Ambulatory Visit: Payer: Self-pay | Admitting: Internal Medicine

## 2018-03-11 ENCOUNTER — Other Ambulatory Visit: Payer: Self-pay

## 2018-03-11 ENCOUNTER — Emergency Department (HOSPITAL_COMMUNITY)
Admission: EM | Admit: 2018-03-11 | Discharge: 2018-03-11 | Disposition: A | Discharge status: Home / Self Care | Payer: Medicare Other | Attending: Emergency Medicine | Admitting: Emergency Medicine

## 2018-03-11 ENCOUNTER — Encounter (HOSPITAL_COMMUNITY): Payer: Self-pay

## 2018-03-11 DIAGNOSIS — Z79899 Other long term (current) drug therapy: Secondary | ICD-10-CM | POA: Diagnosis not present

## 2018-03-11 DIAGNOSIS — N183 Chronic kidney disease, stage 3 (moderate): Secondary | ICD-10-CM | POA: Diagnosis not present

## 2018-03-11 DIAGNOSIS — I129 Hypertensive chronic kidney disease with stage 1 through stage 4 chronic kidney disease, or unspecified chronic kidney disease: Secondary | ICD-10-CM | POA: Insufficient documentation

## 2018-03-11 DIAGNOSIS — M25561 Pain in right knee: Secondary | ICD-10-CM | POA: Diagnosis not present

## 2018-03-11 DIAGNOSIS — Z87891 Personal history of nicotine dependence: Secondary | ICD-10-CM | POA: Insufficient documentation

## 2018-03-11 DIAGNOSIS — N369 Urethral disorder, unspecified: Secondary | ICD-10-CM | POA: Diagnosis not present

## 2018-03-11 DIAGNOSIS — Z7982 Long term (current) use of aspirin: Secondary | ICD-10-CM | POA: Insufficient documentation

## 2018-03-11 DIAGNOSIS — N368 Other specified disorders of urethra: Secondary | ICD-10-CM

## 2018-03-11 DIAGNOSIS — R300 Dysuria: Secondary | ICD-10-CM | POA: Diagnosis present

## 2018-03-11 LAB — CBC WITH DIFFERENTIAL/PLATELET
BASOS ABS: 0 10*3/uL (ref 0.0–0.1)
Basophils Relative: 1 %
EOS PCT: 1 %
Eosinophils Absolute: 0.1 10*3/uL (ref 0.0–0.7)
HCT: 43.5 % (ref 39.0–52.0)
Hemoglobin: 13.9 g/dL (ref 13.0–17.0)
LYMPHS PCT: 50 %
Lymphs Abs: 3.1 10*3/uL (ref 0.7–4.0)
MCH: 28.5 pg (ref 26.0–34.0)
MCHC: 32 g/dL (ref 30.0–36.0)
MCV: 89.1 fL (ref 78.0–100.0)
MONO ABS: 0.3 10*3/uL (ref 0.1–1.0)
Monocytes Relative: 6 %
Neutro Abs: 2.6 10*3/uL (ref 1.7–7.7)
Neutrophils Relative %: 42 %
PLATELETS: 269 10*3/uL (ref 150–400)
RBC: 4.88 MIL/uL (ref 4.22–5.81)
RDW: 14.7 % (ref 11.5–15.5)
WBC: 6.1 10*3/uL (ref 4.0–10.5)

## 2018-03-11 LAB — BASIC METABOLIC PANEL
Anion gap: 8 (ref 5–15)
BUN: 30 mg/dL — AB (ref 8–23)
CALCIUM: 9 mg/dL (ref 8.9–10.3)
CO2: 32 mmol/L (ref 22–32)
Chloride: 103 mmol/L (ref 98–111)
Creatinine, Ser: 2.17 mg/dL — ABNORMAL HIGH (ref 0.61–1.24)
GFR calc Af Amer: 34 mL/min — ABNORMAL LOW (ref >60)
GFR, EST NON AFRICAN AMERICAN: 30 mL/min — AB (ref >60)
GLUCOSE: 90 mg/dL (ref 70–99)
Potassium: 3.9 mmol/L (ref 3.5–5.1)
Sodium: 143 mmol/L (ref 135–145)

## 2018-03-11 LAB — URINALYSIS, ROUTINE W REFLEX MICROSCOPIC
Bacteria, UA: NONE SEEN
Bilirubin Urine: NEGATIVE
GLUCOSE, UA: NEGATIVE mg/dL
KETONES UR: NEGATIVE mg/dL
Leukocytes, UA: NEGATIVE
NITRITE: NEGATIVE
PROTEIN: NEGATIVE mg/dL
Specific Gravity, Urine: 1.013 (ref 1.005–1.030)
pH: 7 (ref 5.0–8.0)

## 2018-03-11 LAB — URIC ACID: Uric Acid, Serum: 9.4 mg/dL — ABNORMAL HIGH (ref 3.7–8.6)

## 2018-03-11 MED ORDER — PREDNISONE 20 MG PO TABS: 40.0000 mg | ORAL_TABLET | Freq: Every day | ORAL | 0 refills | Status: DC

## 2018-03-11 MED ORDER — LIDOCAINE (ANORECTAL) 5 % EX GEL: 1.0000 "application " | CUTANEOUS | 0 refills | Status: DC | PRN

## 2018-03-11 MED ORDER — LIDOCAINE HCL URETHRAL/MUCOSAL 2 % EX GEL
1.0000 "application " | Freq: Once | CUTANEOUS | Status: AC
  Administered 2018-03-11: 1 via URETHRAL
  Filled 2018-03-11: qty 5

## 2018-03-11 NOTE — ED Triage Notes (Addendum)
Pt states that he self caths. Pt states that he has been having bleeding from his penis .  Pt states that it was very slight yesterday, and has progressively gotten worse. Pt states the blood has not been in the urine  Pt states he has noticed some swelling in his knees as well.

## 2018-03-11 NOTE — ED Provider Notes (Signed)
[facility] DEPT Provider Note   CSN: [id number] Arrival date & time: 03/11/18  1418     History   Chief Complaint Chief Complaint  Patient presents with  . Hematuria  . Joint Swelling    HPI [name] is a 67 y.o. male.  HPI  Patient presents with concern of blood at the urethral meatus, dysuria, and concern for right knee swelling. Patient has history of chronic self-catheterization due to prostatic hyperplasia, as well as a history of gout. He notes that beginning about 1 week ago he has began to notice blood at the meatus, both with catheterization and without. Some associated mild dysuria, no fever, chills, nausea, vomiting. No abdominal pain either. No additional medication taken for relief of the bleeding. Patient secondary concern of knee pain is related to progression of discomfort, swelling, worse with ambulation and motion. Patient was previously on colchicine, but is currently using allopurinol for gout relief. No distal loss of sensation or strength.  Past Medical History:  Diagnosis Date  . Anemia    normal Fe, nl B12, nl retic, nl EPO July '13  . Blood transfusion without reported diagnosis   . BPH (benign prostatic hyperplasia)   . Chronic back pain   . Chronic kidney disease    CKD III, obstructive nephropathy  . Diverticulosis   . Dysuria   . Elevated PSA, greater than or equal to 20 ng/ml June '13   PSA 107  . Hemorrhoids, internal, with bleeding, prolapse 09/19/2014  . Hyperlipidemia 05/29/2014  . Hypertension   . Obstructive uropathy 11/27/2015  . Renal insufficiency   . Tuberculosis    h/o PPD +  . UTI (urinary tract infection)   . Vitamin D deficiency 09/13/2017    Patient Active Problem List   Diagnosis Date Noted  . Vitamin D deficiency 09/13/2017  . Urinary tract infection without hematuria 07/23/2017  . Low back pain 07/03/2017  . Dizziness 06/30/2017  . Acute gouty arthritis 12/01/2016  .  Degenerative arthritis of knee, bilateral 11/18/2016  . Urinary retention due to benign prostatic hyperplasia 10/24/2016  . Mass of right side of neck 10/20/2016  . Gout 10/20/2016  . Knee pain, acute 10/12/2016  . Hypokalemia 05/28/2016  . Encounter for well adult exam with abnormal findings 11/27/2015  . Obstructive uropathy 11/27/2015  . Elevated PSA 11/27/2015  . Acute pyelonephritis 05/27/2015  . Generalized bloating 05/27/2015  . Constipation 05/27/2015  . Chest pain 05/27/2015  . AKI (acute kidney injury) ([place]) 05/27/2015  . Acute sinus infection 11/22/2014  . Cough 09/25/2014  . Hemorrhoids, internal, with bleeding, prolapse 09/19/2014  . 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 Hyperlipidemia 05/29/2014  . Lumbar radiculopathy 05/23/2014  . Lumbar stenosis 11/20/2013  . Abnormal glucose 11/06/2013  . Unspecified hereditary and idiopathic peripheral neuropathy 01/22/2013  . Cardiomyopathy due to hypertension ([place]) 12/28/2011  . CKD (chronic kidney disease) stage 3, GFR 30-59 ml/min ([facility]) 11/24/2011  . Hematuria 11/24/2011  . Hydronephrosis 11/24/2011  . Anemia 11/24/2011  . BRADYCARDIA 02/05/2010  . TINEA PEDIS 01/29/2010  . 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 PERIPHERAL EDEMA 01/29/2010  . ABNORMAL ELECTROCARDIOGRAM 01/29/2010  . 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  . Osteoarthrosis, generalized, 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  . ONYCHOMYCOSIS, TOENAILS 10/02/2007  . BPH (benign prostatic hyperplasia) 10/02/2007  . Essential hypertension 03/06/2007    Past Surgical History:  Procedure Laterality Date  . CARPAL TUNNEL RELEASE Right   . COLONOSCOPY    . HEMORRHOID BANDING    .  PROSTATE SURGERY    . SPLENECTOMY          Home Medications    Prior to Admission medications   Medication Sig Start Date End Date Taking? Authorizing Provider  acetaminophen (TYLENOL) 325 MG tablet Take 2 tablets (650 mg total) by mouth every 6 (six) hours as needed. Patient taking differently:  Take 650 mg by mouth every 6 (six) hours as needed for mild pain, moderate pain or headache.  05/23/15   Varney Biles, MD  allopurinol (ZYLOPRIM) 100 MG tablet TAKE 1 TABLET BY MOUTH ONCE DAILY 11/02/17   Biagio Borg, MD  aspirin 81 MG tablet Take 81 mg by mouth daily.    [provider]  atorvastatin (LIPITOR) 40 MG tablet TAKE 1 TABLET BY MOUTH ONCE DAILY 01/10/18   Biagio Borg, MD  colchicine 0.6 MG tablet TAKE 1 TABLET BY MOUTH ONCE DAILY 02/24/18   Biagio Borg, MD  finasteride (PROSCAR) 5 MG tablet TAKE ONE TABLET BY MOUTH ONCE DAILY 08/08/17   Biagio Borg, MD  FOLIC ACID PO Take 1 tablet by mouth daily.    [provider]  metoprolol tartrate (LOPRESSOR) 25 MG tablet Take 1 tablet (25 mg total) by mouth 2 (two) times daily. 12/29/16   Biagio Borg, MD  oxyCODONE-acetaminophen (PERCOCET/ROXICET) 5-325 MG tablet Take 1-2 tablets by mouth every 6 (six) hours as needed for severe pain. 01/24/18   Petrucelli, Samantha R, PA-C  POTASSIUM PO Take 1 tablet by mouth daily.    [provider]  predniSONE (DELTASONE) 50 MG tablet Take 1 tablet (50 mg total) by mouth daily with breakfast. 01/24/18   Petrucelli, Samantha R, PA-C  tamsulosin (FLOMAX) 0.4 MG CAPS capsule Take 0.4 mg by mouth 2 (two) times daily.  04/11/17   Nickie Retort, MD  Vitamin D, Cholecalciferol, 1000 units CAPS Take 2,000 Units by mouth.    [provider]    Family History Family History  Problem Relation Age of Onset  . Diabetes Mother   . Stroke Brother   . Hearing loss Brother   . Colon cancer Neg Hx   . Rectal cancer Neg Hx   . Stomach cancer Neg Hx   . Esophageal cancer Neg Hx     Social History Social History   Tobacco Use  . Smoking status: Former Smoker    Types: Cigarettes  . Smokeless tobacco: Never Used  . Tobacco comment: Quit 1981  Substance Use Topics  . Alcohol use: No    Alcohol/week: 0.0 standard drinks    Comment: Quit 2004  . Drug use: No      Allergies   Patient has no known allergies.   Review of Systems Review of Systems  Constitutional:       Per HPI, otherwise negative  HENT:       Per HPI, otherwise negative  Respiratory:       Per HPI, otherwise negative  Cardiovascular:       Per HPI, otherwise negative  Gastrointestinal: Negative for vomiting.  Endocrine:       Negative aside from HPI  Genitourinary:       Neg aside from HPI   Musculoskeletal:       Per HPI, otherwise negative  Skin: Negative.   Neurological: Negative for syncope.     Physical Exam Updated Vital Signs BP (!) 160/103 (BP Location: Right Arm)   Pulse (!) 55   Temp 97.9 F (36.6 C) (Oral)  Resp 18   Ht 6\' 1"  (1.854 m)   Wt 111.1 kg   SpO2 95%   BMI 32.32 kg/m   Physical Exam  Constitutional: He is oriented to person, place, and time. He appears well-developed. No distress.  HENT:  Head: Normocephalic and atraumatic.  Eyes: Conjunctivae and EOM are normal.  Cardiovascular: Normal rate and regular rhythm.  Pulmonary/Chest: Effort normal. No stridor. No respiratory distress.  Abdominal: He exhibits no distension.  Genitourinary:     Musculoskeletal: He exhibits no edema.       Legs: Neurological: He is alert and oriented to person, place, and time.  Skin: Skin is warm and dry.  Psychiatric: He has a normal mood and affect.  Nursing note and vitals reviewed.    ED Treatments / Results  Labs (all labs ordered are listed, but only abnormal results are displayed) Labs Reviewed  URINALYSIS, ROUTINE W REFLEX MICROSCOPIC - Abnormal; Notable for the following components:      Result Value   Hgb urine dipstick MODERATE (*)    All other components within normal limits  BASIC METABOLIC PANEL - Abnormal; Notable for the following components:   BUN 30 (*)    Creatinine, Ser 2.17 (*)    GFR calc non Af Amer 30 (*)    GFR calc Af Amer 34 (*)    All other components within normal limits  URIC ACID - Abnormal; Notable  for the following components:   Uric Acid, Serum 9.4 (*)    All other components within normal limits  CBC WITH DIFFERENTIAL/PLATELET    Procedures Procedures (including critical care time)  Medications Ordered in ED Medications  lidocaine (XYLOCAINE) 2 % jelly 1 application (1 application Urethral Given 03/11/18 1615)     Initial Impression / Assessment and Plan / ED Course  I have reviewed the triage vital signs and the nursing notes.  Pertinent labs & imaging results that were available during my care of the patient were reviewed by me and considered in my medical decision making (see chart for details).     5:34 PM Patient in no distress awake, alert, speaking clearly, sitting upright.  We discussed all findings including reassuring urinalysis, labs consistent with menstruation of CKD, but no progression.  We discussed therapy for improvement, including topical lidocaine for catheterization, and short course of steroids for knee pain. No evidence for septic arthritis, or substantial progression of disease per Patient will follow-up with primary care and orthopedics.  Final Clinical Impressions(s) / ED Diagnoses   Final diagnoses:  Urethral bleeding  Acute pain of right knee    ED Discharge Orders         Ordered    predniSONE (DELTASONE) 20 MG tablet  Daily with breakfast     03/11/18 1742    Lidocaine, Anorectal, 5 % GEL  As needed     03/11/18 1742           Carmin Muskrat, MD 03/11/18 2315

## 2018-03-11 NOTE — Discharge Instructions (Signed)
As discussed, your evaluation today has been largely reassuring.  But, it is important that you monitor your condition carefully, and do not hesitate to return to the ED if you develop new, or concerning changes in your condition. ? ?Otherwise, please follow-up with your physician for appropriate ongoing care. ? ?

## 2018-03-14 DIAGNOSIS — N183 Chronic kidney disease, stage 3 (moderate): Secondary | ICD-10-CM | POA: Diagnosis not present

## 2018-03-14 DIAGNOSIS — N2581 Secondary hyperparathyroidism of renal origin: Secondary | ICD-10-CM | POA: Diagnosis not present

## 2018-03-14 DIAGNOSIS — I129 Hypertensive chronic kidney disease with stage 1 through stage 4 chronic kidney disease, or unspecified chronic kidney disease: Secondary | ICD-10-CM | POA: Diagnosis not present

## 2018-03-14 DIAGNOSIS — D631 Anemia in chronic kidney disease: Secondary | ICD-10-CM | POA: Diagnosis not present

## 2018-03-21 ENCOUNTER — Emergency Department (HOSPITAL_COMMUNITY)
Admission: EM | Admit: 2018-03-21 | Discharge: 2018-03-21 | Disposition: A | Discharge status: Home / Self Care | Payer: Medicare Other | Attending: Emergency Medicine | Admitting: Emergency Medicine

## 2018-03-21 ENCOUNTER — Encounter (HOSPITAL_COMMUNITY): Payer: Self-pay | Admitting: *Deleted

## 2018-03-21 ENCOUNTER — Other Ambulatory Visit: Payer: Self-pay

## 2018-03-21 DIAGNOSIS — I129 Hypertensive chronic kidney disease with stage 1 through stage 4 chronic kidney disease, or unspecified chronic kidney disease: Secondary | ICD-10-CM | POA: Insufficient documentation

## 2018-03-21 DIAGNOSIS — N183 Chronic kidney disease, stage 3 (moderate): Secondary | ICD-10-CM | POA: Insufficient documentation

## 2018-03-21 DIAGNOSIS — Z79899 Other long term (current) drug therapy: Secondary | ICD-10-CM | POA: Diagnosis not present

## 2018-03-21 DIAGNOSIS — Z87891 Personal history of nicotine dependence: Secondary | ICD-10-CM | POA: Diagnosis not present

## 2018-03-21 DIAGNOSIS — M10061 Idiopathic gout, right knee: Secondary | ICD-10-CM | POA: Insufficient documentation

## 2018-03-21 DIAGNOSIS — M25561 Pain in right knee: Secondary | ICD-10-CM | POA: Diagnosis present

## 2018-03-21 DIAGNOSIS — Z7982 Long term (current) use of aspirin: Secondary | ICD-10-CM | POA: Diagnosis not present

## 2018-03-21 LAB — BASIC METABOLIC PANEL
ANION GAP: 7 (ref 5–15)
BUN: 30 mg/dL — ABNORMAL HIGH (ref 8–23)
CALCIUM: 8.4 mg/dL — AB (ref 8.9–10.3)
CO2: 33 mmol/L — AB (ref 22–32)
Chloride: 102 mmol/L (ref 98–111)
Creatinine, Ser: 2.08 mg/dL — ABNORMAL HIGH (ref 0.61–1.24)
GFR, EST AFRICAN AMERICAN: 36 mL/min — AB (ref >60)
GFR, EST NON AFRICAN AMERICAN: 31 mL/min — AB (ref >60)
GLUCOSE: 100 mg/dL — AB (ref 70–99)
POTASSIUM: 4.6 mmol/L (ref 3.5–5.1)
Sodium: 142 mmol/L (ref 135–145)

## 2018-03-21 LAB — CBC WITH DIFFERENTIAL/PLATELET
ABS IMMATURE GRANULOCYTES: 0.02 10*3/uL (ref 0.00–0.07)
BASOS ABS: 0 10*3/uL (ref 0.0–0.1)
BASOS PCT: 0 %
Eosinophils Absolute: 0.1 10*3/uL (ref 0.0–0.5)
Eosinophils Relative: 1 %
HEMATOCRIT: 42.7 % (ref 39.0–52.0)
Hemoglobin: 13.3 g/dL (ref 13.0–17.0)
IMMATURE GRANULOCYTES: 0 %
LYMPHS PCT: 35 %
Lymphs Abs: 2.6 10*3/uL (ref 0.7–4.0)
MCH: 28.1 pg (ref 26.0–34.0)
MCHC: 31.1 g/dL (ref 30.0–36.0)
MCV: 90.3 fL (ref 80.0–100.0)
Monocytes Absolute: 1 10*3/uL (ref 0.1–1.0)
Monocytes Relative: 13 %
NEUTROS ABS: 3.7 10*3/uL (ref 1.7–7.7)
NEUTROS PCT: 51 %
PLATELETS: 281 10*3/uL (ref 150–400)
RBC: 4.73 MIL/uL (ref 4.22–5.81)
RDW: 14.8 % (ref 11.5–15.5)
WBC: 7.4 10*3/uL (ref 4.0–10.5)
nRBC: 0 % (ref 0.0–0.2)

## 2018-03-21 LAB — SYNOVIAL CELL COUNT + DIFF, W/ CRYSTALS
EOSINOPHILS-SYNOVIAL: 0 % (ref 0–1)
Lymphocytes-Synovial Fld: 1 % (ref 0–20)
Monocyte-Macrophage-Synovial Fluid: 19 % — ABNORMAL LOW (ref 50–90)
Neutrophil, Synovial: 80 % — ABNORMAL HIGH (ref 0–25)
WBC, Synovial: 33500 /mm3 — ABNORMAL HIGH (ref 0–200)

## 2018-03-21 MED ORDER — LIDOCAINE HCL 2 % IJ SOLN
20.0000 mL | Freq: Once | INTRAMUSCULAR | Status: AC
  Administered 2018-03-21: 400 mg
  Filled 2018-03-21: qty 20

## 2018-03-21 MED ORDER — COLCHICINE 0.6 MG PO TABS
0.6000 mg | ORAL_TABLET | Freq: Once | ORAL | Status: AC
  Administered 2018-03-21: 0.6 mg via ORAL
  Filled 2018-03-21: qty 1

## 2018-03-21 MED ORDER — COLCHICINE 0.6 MG PO TABS
1.2000 mg | ORAL_TABLET | Freq: Once | ORAL | Status: AC
  Administered 2018-03-21: 1.2 mg via ORAL
  Filled 2018-03-21: qty 2

## 2018-03-21 MED ORDER — COLCHICINE 0.6 MG PO TABS
1.2000 mg | ORAL_TABLET | Freq: Once | ORAL | Status: DC
  Filled 2018-03-21: qty 2

## 2018-03-21 NOTE — Discharge Instructions (Addendum)
Please read instructions below. Apply ice to your knee for 20 minutes at a time. Wear the ace wrap for compression and elevate it as much as possible. You can take tylenol every 4 hours as needed for pain. Call you primary care provider tomorrow to schedule an appointment for follow up on your visit today.  Return to the ER for fever, or new or concerning symptoms.

## 2018-03-21 NOTE — ED Provider Notes (Signed)
[facility] DEPT Provider Note   CSN: [id number] Arrival date & time: 03/21/18  1404     History   Chief Complaint Chief Complaint  Patient presents with  . Leg Pain    bilateral    HPI [name] is a 67 y.o. male w PMHx gout, CKD, HTN, Presenting to the ED with complaint of persistent worsening chronic pain of right knee. Pain is worse with movement and he has very limited movement. Assoc significant swelling. Hx of gout and severe osteoarthritis. States pain feels worse. Denies recent injury or fever. He was evaluated on 03/11/18 and prescribed prednisone, which provided him relief, though when it ran out his pain returned. Has also been taking tylenol for pain.   The history is provided by the patient.    Past Medical History:  Diagnosis Date  . Anemia    normal Fe, nl B12, nl retic, nl EPO July '13  . Blood transfusion without reported diagnosis   . BPH (benign prostatic hyperplasia)   . Chronic back pain   . Chronic kidney disease    CKD III, obstructive nephropathy  . Diverticulosis   . Dysuria   . Elevated PSA, greater than or equal to 20 ng/ml June '13   PSA 107  . Hemorrhoids, internal, with bleeding, prolapse 09/19/2014  . Hyperlipidemia 05/29/2014  . Hypertension   . Obstructive uropathy 11/27/2015  . Renal insufficiency   . Tuberculosis    h/o PPD +  . UTI (urinary tract infection)   . Vitamin D deficiency 09/13/2017    Patient Active Problem List   Diagnosis Date Noted  . Vitamin D deficiency 09/13/2017  . Urinary tract infection without hematuria 07/23/2017  . Low back pain 07/03/2017  . Dizziness 06/30/2017  . Acute gouty arthritis 12/01/2016  . Degenerative arthritis of knee, bilateral 11/18/2016  . Urinary retention due to benign prostatic hyperplasia 10/24/2016  . Mass of right side of neck 10/20/2016  . Gout 10/20/2016  . Knee pain, acute 10/12/2016  . Hypokalemia 05/28/2016  . Encounter for well adult exam  with abnormal findings 11/27/2015  . Obstructive uropathy 11/27/2015  . Elevated PSA 11/27/2015  . Acute pyelonephritis 05/27/2015  . Generalized bloating 05/27/2015  . Constipation 05/27/2015  . Chest pain 05/27/2015  . AKI (acute kidney injury) ([place]) 05/27/2015  . Acute sinus infection 11/22/2014  . Cough 09/25/2014  . Hemorrhoids, internal, with bleeding, prolapse 09/19/2014  . 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 Hyperlipidemia 05/29/2014  . Lumbar radiculopathy 05/23/2014  . Lumbar stenosis 11/20/2013  . Abnormal glucose 11/06/2013  . Unspecified hereditary and idiopathic peripheral neuropathy 01/22/2013  . 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 CKD (chronic kidney disease) stage 3, GFR 30-59 ml/min ([facility]) 11/24/2011  . Hematuria 11/24/2011  . Hydronephrosis 11/24/2011  . Anemia 11/24/2011  . BRADYCARDIA 02/05/2010  . TINEA PEDIS 01/29/2010  . 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 PERIPHERAL EDEMA 01/29/2010  . ABNORMAL ELECTROCARDIOGRAM 01/29/2010  . 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  . Osteoarthrosis, generalized, 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  . ONYCHOMYCOSIS, TOENAILS 10/02/2007  . BPH (benign prostatic hyperplasia) 10/02/2007  . Essential hypertension 03/06/2007    Past Surgical History:  Procedure Laterality Date  . CARPAL TUNNEL RELEASE Right   . COLONOSCOPY    . HEMORRHOID BANDING    . PROSTATE SURGERY    . SPLENECTOMY          Home Medications    Prior to Admission medications   Medication Sig Start Date End  Date Taking? Authorizing Provider  acetaminophen (TYLENOL) 325 MG tablet Take 2 tablets (650 mg total) by mouth every 6 (six) hours as needed. Patient taking differently: Take 650 mg by mouth every 6 (six) hours as needed for mild pain, moderate pain or headache.  05/23/15  Yes Varney Biles, MD  allopurinol (ZYLOPRIM) 100 MG tablet TAKE 1 TABLET BY MOUTH ONCE DAILY 11/02/17  Yes Biagio Borg, MD  aspirin 81 MG tablet Take 81 mg by mouth  daily.   Yes [provider]  atorvastatin (LIPITOR) 40 MG tablet TAKE 1 TABLET BY MOUTH ONCE DAILY 01/10/18  Yes Biagio Borg, MD  finasteride (PROSCAR) 5 MG tablet TAKE ONE TABLET BY MOUTH ONCE DAILY 08/08/17  Yes Biagio Borg, MD  metoprolol tartrate (LOPRESSOR) 25 MG tablet Take 1 tablet (25 mg total) by mouth 2 (two) times daily. 12/29/16  Yes Biagio Borg, MD  colchicine 0.6 MG tablet TAKE 1 TABLET BY MOUTH ONCE DAILY Patient not taking: Reported on 03/21/2018 02/24/18   Biagio Borg, MD  Lidocaine, Anorectal, 5 % GEL Apply 1 application topically as needed (use as needed for urinary catheterization). Patient not taking: Reported on 03/21/2018 03/11/18   Carmin Muskrat, MD  oxyCODONE-acetaminophen (PERCOCET/ROXICET) 5-325 MG tablet Take 1-2 tablets by mouth every 6 (six) hours as needed for severe pain. Patient not taking: Reported on 03/21/2018 01/24/18   Petrucelli, Glynda Jaeger, PA-C  predniSONE (DELTASONE) 20 MG tablet Take 2 tablets (40 mg total) by mouth daily with breakfast. For the next four days Patient not taking: Reported on 03/21/2018 03/11/18   Carmin Muskrat, MD    Family History Family History  Problem Relation Age of Onset  . Diabetes Mother   . Stroke Brother   . Hearing loss Brother   . Colon cancer Neg Hx   . Rectal cancer Neg Hx   . Stomach cancer Neg Hx   . Esophageal cancer Neg Hx     Social History Social History   Tobacco Use  . Smoking status: Former Smoker    Types: Cigarettes  . Smokeless tobacco: Never Used  . Tobacco comment: Quit 1981  Substance Use Topics  . Alcohol use: No    Alcohol/week: 0.0 standard drinks    Comment: Quit 2004  . Drug use: No     Allergies   Patient has no known allergies.   Review of Systems Review of Systems  Constitutional: Negative for chills, fatigue and fever.  Musculoskeletal: Positive for arthralgias and joint swelling.  Skin: Negative for color change and wound.  All other systems reviewed and  are negative.    Physical Exam Updated Vital Signs BP (!) 157/111 (BP Location: Right Arm)   Pulse 77   Temp 98.2 F (36.8 C) (Oral)   Resp 18   Ht 6\' 1"  (1.854 m)   Wt 111.1 kg   SpO2 96%   BMI 32.32 kg/m   Physical Exam  Constitutional: He appears well-developed and well-nourished. No distress.  HENT:  Head: Normocephalic and atraumatic.  Eyes: Conjunctivae are normal.  Cardiovascular: Normal rate and intact distal pulses.  Pulmonary/Chest: Effort normal.  Abdominal: Soft.  Musculoskeletal:  Right knee with significant edema and palpable effusion.  No erythema.  Slightly warmer than the left.  Decreased range of motion secondary to swelling and pain.  Normal sensation distally.  Neurological: He is alert.  Skin: Skin is warm.  Psychiatric: He has a normal mood and affect. His behavior is normal.  Nursing  note and vitals reviewed.    ED Treatments / Results  Labs (all labs ordered are listed, but only abnormal results are displayed) Labs Reviewed  SYNOVIAL CELL COUNT + DIFF, W/ CRYSTALS - Abnormal; Notable for the following components:      Result Value   Color, Synovial AMBER (*)    Appearance-Synovial TURBID (*)    WBC, Synovial 33,500 (*)    Neutrophil, Synovial 80 (*)    Monocyte-Macrophage-Synovial Fluid 19 (*)    All other components within normal limits  BASIC METABOLIC PANEL - Abnormal; Notable for the following components:   CO2 33 (*)    Glucose, Bld 100 (*)    BUN 30 (*)    Creatinine, Ser 2.08 (*)    Calcium 8.4 (*)    GFR calc non Af Amer 31 (*)    GFR calc Af Amer 36 (*)    All other components within normal limits  BODY FLUID CULTURE  CBC WITH DIFFERENTIAL/PLATELET  GLUCOSE, BODY FLUID OTHER  PROTEIN, BODY FLUID (OTHER)  URIC ACID, BODY FLUID    EKG None  Radiology No results found.  Procedures .Joint Aspiration/Arthrocentesis Date/Time: 03/21/2018 5:57 PM Performed by: Bryen Hinderman, [place] N, PA-C Authorized by: Kursten Kruk,  [place] N, PA-C   Consent:    Consent obtained:  Verbal   Consent given by:  Patient   Risks discussed:  Bleeding, infection and pain   Alternatives discussed:  Alternative treatment Location:    Location:  Knee   Knee:  R knee Anesthesia (see MAR for exact dosages):    Anesthesia method:  Local infiltration   Local anesthetic:  Lidocaine 2% w/o epi Procedure details:    Preparation: Patient was prepped and draped in usual sterile fashion     Needle gauge:  18 G   Ultrasound guidance: no     Approach:  Inferior   Aspirate amount:  40cc   Aspirate characteristics:  Serous and blood-tinged   Steroid injected: no (10cc lidocaine injected)     Specimen collected: yes   Post-procedure details:    Dressing:  Adhesive bandage   Patient tolerance of procedure:  Tolerated well, no immediate complications Comments:     Initially attempted lateral approach however unsuccessful, landmarks difficult to palpate secondary to swelling.  Inferior approach was successful with Dr. Tyrone Nine at bedside.  Injected about 10 cc of 2% lidocaine without epi into the joint following aspiration.   (including critical care time)  Medications Ordered in ED Medications  colchicine tablet 0.6 mg (has no administration in time range)  lidocaine (XYLOCAINE) 2 % (with pres) injection 400 mg (400 mg Other Given 03/21/18 1556)  colchicine tablet 1.2 mg (1.2 mg Oral Given 03/21/18 2015)     Initial Impression / Assessment and Plan / ED Course  I have reviewed the triage vital signs and the nursing notes.  Pertinent labs & imaging results that were available during my care of the patient were reviewed by me and considered in my medical decision making (see chart for details).     Patient with history of gout and severe osteoarthritis of bilateral knees, presented to the ED with worsening right knee pain and swelling.  Seen on the fifth and prescribed prednisone which provided relief however symptoms returned.   Patient endorses swelling and pain with range of motion.  Denies fever.  On exam, he is afebrile and well-appearing.  Right knee with significant swelling and decreased range of motion.  No erythema there is some warmth.  Arthrocentesis performed with synovial fluid analysis showing monosodium urate crystals and WBC less than 50,000.  No organisms seen on Gram stain.  Normal white blood cell count. Not consistent with septic arthritis.  Creatinine appears to be at baseline if not improved.  Treated with colchicine for gout.  Discussed close follow-up with PCP and strict return precautions.  Safe for discharge.  Patient discussed with and seen by Dr. Tyrone Nine, who guided treatment and agrees with care plan.  Discussed results, findings, treatment and follow up. Patient advised of return precautions. Patient verbalized understanding and agreed with plan.  Final Clinical Impressions(s) / ED Diagnoses   Final diagnoses:  Idiopathic gout of right knee, unspecified chronicity    ED Discharge Orders    None       Akshat Minehart, [place] N, PA-C 03/21/18 [place], [place], DO 03/21/18 2312

## 2018-03-21 NOTE — ED Triage Notes (Signed)
Pt reports bilateral knee swelling.Pt reports bilateral leg pain. Pt was seen for similar symptoms last week and was prescribed medications that were helpful.  Pt states his pain came back and hasn't been able to get in with his MD.  Pt also reports that one for one of the medications, he cannot afford the copay for.

## 2018-03-23 DIAGNOSIS — D170 Benign lipomatous neoplasm of skin and subcutaneous tissue of head, face and neck: Secondary | ICD-10-CM | POA: Diagnosis not present

## 2018-03-23 LAB — GLUCOSE, BODY FLUID OTHER: GLUCOSE, BODY FLUID OTHER: 13 mg/dL

## 2018-03-24 ENCOUNTER — Ambulatory Visit (INDEPENDENT_AMBULATORY_CARE_PROVIDER_SITE_OTHER): Payer: Medicare Other | Admitting: Internal Medicine

## 2018-03-24 ENCOUNTER — Inpatient Hospital Stay: Payer: Medicare Other | Admitting: Family Medicine

## 2018-03-24 ENCOUNTER — Encounter: Payer: Self-pay | Admitting: Internal Medicine

## 2018-03-24 VITALS — BP 126/88 | HR 74 | Temp 97.8°F | Ht 73.0 in

## 2018-03-24 DIAGNOSIS — N183 Chronic kidney disease, stage 3 unspecified: Secondary | ICD-10-CM

## 2018-03-24 DIAGNOSIS — R7309 Other abnormal glucose: Secondary | ICD-10-CM | POA: Diagnosis not present

## 2018-03-24 DIAGNOSIS — Z23 Encounter for immunization: Secondary | ICD-10-CM | POA: Diagnosis not present

## 2018-03-24 DIAGNOSIS — M109 Gout, unspecified: Secondary | ICD-10-CM | POA: Diagnosis not present

## 2018-03-24 LAB — PROTEIN, BODY FLUID (OTHER): Total Protein, Body Fluid Other: 2.2 g/dL

## 2018-03-24 MED ORDER — METHYLPREDNISOLONE ACETATE 80 MG/ML IJ SUSP
80.0000 mg | Freq: Once | INTRAMUSCULAR | Status: AC
  Administered 2018-03-24: 80 mg via INTRAMUSCULAR

## 2018-03-24 MED ORDER — PREDNISONE 10 MG PO TABS: ORAL_TABLET | ORAL | 0 refills | Status: DC

## 2018-03-24 NOTE — Patient Instructions (Addendum)
You had the steroid shot today  Please take all new medication as prescribed - the prednisone  Please continue all other medications as before, and refills have been done if requested.  Please have the pharmacy call with any other refills you may need.  Please continue your efforts at being more active, low cholesterol diet, and weight control.  Please keep your appointments with your specialists as you may have planned   

## 2018-03-24 NOTE — Progress Notes (Signed)
Subjective:    Patient ID: [name], male    DOB: 05/05/1951, 67 y.o.   MRN: [id number]  HPI  Here to f/u recent ED visit with 3 days severe sharp right knee pain and swelling, with neg tap for abnormal gram strain and culture neg; was tx with colchicine in ED only; he feels overall somewhat improved today but still severe and walking with crutches to get in the door here, and now in wheelchair to be examined.  Pt denies chest pain, increased sob or doe, wheezing, orthopnea, PND, increased LE swelling, palpitations, dizziness or syncope.  Pt denies new neurological symptoms such as new headache, or facial or extremity weakness or numbness   Pt denies polydipsia, polyuria  Found colchcine too expensive at the pharmacy. Also,  Saw nephrology last wk, stable, no further testing.  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  Pt going for second opinion  Pt denies fever, wt loss, night sweats, loss of appetite, or other constitutional symptoms Past Medical History:  Diagnosis Date  . Anemia    normal Fe, nl B12, nl retic, nl EPO July '13  . Blood transfusion without reported diagnosis   . BPH (benign prostatic hyperplasia)   . Chronic back pain   . Chronic kidney disease    CKD III, obstructive nephropathy  . Diverticulosis   . Dysuria   . Elevated PSA, greater than or equal to 20 ng/ml June '13   PSA 107  . Hemorrhoids, internal, with bleeding, prolapse 09/19/2014  . Hyperlipidemia 05/29/2014  . Hypertension   . Obstructive uropathy 11/27/2015  . Renal insufficiency   . Tuberculosis    h/o PPD +  . UTI (urinary tract infection)   . Vitamin D deficiency 09/13/2017   Past Surgical History:  Procedure Laterality Date  . CARPAL TUNNEL RELEASE Right   . COLONOSCOPY    . HEMORRHOID BANDING    . PROSTATE SURGERY    . SPLENECTOMY      reports that he has quit smoking. His smoking use  included cigarettes. He has never used smokeless tobacco. He reports that he does not drink alcohol or use drugs. family history includes Diabetes in his mother; Hearing loss in his brother; Stroke in his brother. No Known Allergies Current Outpatient Medications on File Prior to Visit  Medication Sig Dispense Refill  . acetaminophen (TYLENOL) 325 MG tablet Take 2 tablets (650 mg total) by mouth every 6 (six) hours as needed. (Patient taking differently: Take 650 mg by mouth every 6 (six) hours as needed for mild pain, moderate pain or headache. ) 12 tablet 0  . allopurinol (ZYLOPRIM) 100 MG tablet TAKE 1 TABLET BY MOUTH ONCE DAILY 90 tablet 3  . aspirin 81 MG tablet Take 81 mg by mouth daily.    Marland Kitchen atorvastatin (LIPITOR) 40 MG tablet TAKE 1 TABLET BY MOUTH ONCE DAILY 90 tablet 3  . colchicine 0.6 MG tablet TAKE 1 TABLET BY MOUTH ONCE DAILY 30 tablet 11  . finasteride (PROSCAR) 5 MG tablet TAKE ONE TABLET BY MOUTH ONCE DAILY 90 tablet 0  . Lidocaine, Anorectal, 5 % GEL Apply 1 application topically as needed (use as needed for urinary catheterization). 113 g 0  . metoprolol tartrate (LOPRESSOR) 25 MG tablet Take 1 tablet (25 mg total) by mouth 2 (two) times daily. 180 tablet 2  . oxyCODONE-acetaminophen (PERCOCET/ROXICET) 5-325 MG tablet Take 1-2 tablets  by mouth every 6 (six) hours as needed for severe pain. 6 tablet 0   No current facility-administered medications on file prior to visit.    Review of Systems  Constitutional: Negative for other unusual diaphoresis or sweats HENT: Negative for ear discharge or swelling Eyes: Negative for other worsening visual disturbances Respiratory: Negative for stridor or other swelling  Gastrointestinal: Negative for worsening distension or other blood Genitourinary: Negative for retention or other urinary change Musculoskeletal: Negative for other MSK pain or swelling Skin: Negative for color change or other new lesions Neurological: Negative for  worsening tremors and other numbness  Psychiatric/Behavioral: Negative for worsening agitation or other fatigue All other system neg per pt    Objective:   Physical Exam BP 126/88   Pulse 74   Temp 97.8 F (36.6 C) (Oral)   Ht 6\' 1"  (1.854 m)   SpO2 97%   BMI 32.32 kg/m  VS noted,  Constitutional: Pt appears in NAD HENT: Head: NCAT.  Right Ear: External ear normal.  Left Ear: External ear normal.  Eyes: . Pupils are equal, round, and reactive to light. Conjunctivae and EOM are normal Nose: without d/c or deformity Neck: Neck supple. Gross normal ROM Cardiovascular: Normal rate and regular rhythm.   Pulmonary/Chest: Effort normal and breath sounds without rales or wheezing.  Right knee with 2+ swelling effusion and reduced ROM, marked tender to palpate, has marked reduced ROM due to pain Neurological: Pt is alert. At baseline orientation, motor grossly intact Skin: Skin is warm. No rashes, other new lesions, no LE edema Psychiatric: Pt behavior is normal without agitation  No other exam findings Lab Results  Component Value Date   WBC 7.4 03/21/2018   HGB 13.3 03/21/2018   HCT 42.7 03/21/2018   PLT 281 03/21/2018   GLUCOSE 100 (H) 03/21/2018   CHOL 134 06/30/2017   TRIG 67.0 06/30/2017   HDL 52.90 06/30/2017   LDLCALC 68 06/30/2017   ALT 16 06/30/2017   AST 18 06/30/2017   NA 142 03/21/2018   K 4.6 03/21/2018   CL 102 03/21/2018   CREATININE 2.08 (H) 03/21/2018   BUN 30 (H) 03/21/2018   CO2 33 (H) 03/21/2018   TSH 1.82 06/30/2017   PSA 9.64 (H) 06/30/2017   INR 1.08 05/12/2012   HGBA1C 5.9 06/30/2017       Assessment & Plan:

## 2018-03-25 LAB — BODY FLUID CULTURE: Culture: NO GROWTH

## 2018-03-25 NOTE — Assessment & Plan Note (Signed)
High suspicion for acute gouty arthritis, for depomedrol IM 80, predpac asd, pain control, should be off crutches I suspect in 3 days; to f/u with sports medicine if not improved

## 2018-03-25 NOTE — Assessment & Plan Note (Signed)
stable overall by history and exam, recent data reviewed with pt, and pt to continue medical treatment as before,  to f/u any worsening symptoms or concerns  

## 2018-03-27 LAB — URIC ACID, BODY FLUID: URIC ACID BODY FLUID: 8 mg/dL

## 2018-03-31 DIAGNOSIS — R221 Localized swelling, mass and lump, neck: Secondary | ICD-10-CM | POA: Diagnosis not present

## 2018-04-20 ENCOUNTER — Ambulatory Visit: Payer: Medicare Other | Admitting: Internal Medicine

## 2018-04-20 ENCOUNTER — Ambulatory Visit: Payer: Self-pay | Admitting: *Deleted

## 2018-04-20 NOTE — Telephone Encounter (Signed)
This encounter was created in error - please disregard.

## 2018-04-20 NOTE — Telephone Encounter (Signed)
Pt calling after experiencing blood coming from his penis since self catheterization this morning between 9-10 am. Pt states he has a paper towel currently around his penis and is still experiencing some bleeding. Pt also notes that there is some blood in his urine and has pain in his penis that he is currently rating between 6-7. Pt states he is currently at home and has to catch the bus to make an appt. Pt advised to seek treatment in the ED due to not having transportation to come in for an appt today. Pt verbalized understanding and states if he is able to find transportation he will return call to the office to schedule appt.   Reason for Disposition . Bleeding around catheter (e.g., from penis or male urethra)  Answer Assessment - Initial Assessment Questions 1. SYMPTOMS: "What symptoms are you concerned about?"     Pt states she is having bleeding from around his penis after self catherization 2. ONSET:  "When did the symptoms start?"     Self catheterized this morning between 9-10 am and pt states he is still experiencing some bleeding to the penis and has been using paper towel around it 3. FEVER: "Is there a fever?" If so, ask: "What is the temperature, how was it measured, and when did it start?"     No 4. ABDOMINAL PAIN: "Is there any abdominal pain?" (e.g., Scale 1-10; or mild, moderate, severe)     Pt did not voice abdominal pain, but states he is experiencing some pain to his penis and is rating it between 6-7 5. URINE COLOR: "What color is the urine?"  "Is there blood present in the urine?" (e.g., clear, yellow, cloudy, tea-colored, blood streaks, bright red)     Pt states he does have a little blood mixed in with his urine but does not specify what color 6. ONSET: "When was the catheter inserted?"     Pt has to perform self catheterization and the last time was between 9-10 am today 7. OTHER SYMPTOMS: "Do you have any other symptoms?" (e.g., back pain, bad urine odor)      No  other symptoms voiced at this time 8. PREGNANCY: "Is there any chance you are pregnant?" "When was your last menstrual period?"     n/a  Protocols used: URINARY CATHETER SYMPTOMS AND QUESTIONS-A-AH

## 2018-04-20 NOTE — Telephone Encounter (Signed)
Agree wit ed visit as we would not be able to assess him adequately in the office, and may need urgent urology consult

## 2018-04-20 NOTE — Telephone Encounter (Signed)
Patient returned call and says he wants an appointment for tomorrow. I advised it was recommended to go to the ED by a previous [place], he says that the blood is not as much and he would like an appointment, but if it gets worse, he will go to the ED. Appointment scheduled today at 1620 with Dr. Jenny Reichmann, patient verbalized understanding.

## 2018-04-21 ENCOUNTER — Encounter (HOSPITAL_COMMUNITY): Payer: Self-pay | Admitting: Emergency Medicine

## 2018-04-21 ENCOUNTER — Emergency Department (HOSPITAL_COMMUNITY)
Admission: EM | Admit: 2018-04-21 | Discharge: 2018-04-21 | Disposition: A | Discharge status: Home / Self Care | Payer: Medicare Other | Attending: Emergency Medicine | Admitting: Emergency Medicine

## 2018-04-21 ENCOUNTER — Ambulatory Visit: Payer: Medicare Other | Admitting: Internal Medicine

## 2018-04-21 DIAGNOSIS — R310 Gross hematuria: Secondary | ICD-10-CM | POA: Insufficient documentation

## 2018-04-21 DIAGNOSIS — Z7982 Long term (current) use of aspirin: Secondary | ICD-10-CM | POA: Diagnosis not present

## 2018-04-21 DIAGNOSIS — M25512 Pain in left shoulder: Secondary | ICD-10-CM | POA: Diagnosis not present

## 2018-04-21 DIAGNOSIS — R319 Hematuria, unspecified: Secondary | ICD-10-CM | POA: Diagnosis present

## 2018-04-21 DIAGNOSIS — Z79899 Other long term (current) drug therapy: Secondary | ICD-10-CM | POA: Diagnosis not present

## 2018-04-21 DIAGNOSIS — N183 Chronic kidney disease, stage 3 (moderate): Secondary | ICD-10-CM | POA: Insufficient documentation

## 2018-04-21 DIAGNOSIS — Z87891 Personal history of nicotine dependence: Secondary | ICD-10-CM | POA: Diagnosis not present

## 2018-04-21 DIAGNOSIS — I129 Hypertensive chronic kidney disease with stage 1 through stage 4 chronic kidney disease, or unspecified chronic kidney disease: Secondary | ICD-10-CM | POA: Diagnosis not present

## 2018-04-21 LAB — URINALYSIS, ROUTINE W REFLEX MICROSCOPIC
Bilirubin Urine: NEGATIVE
Glucose, UA: NEGATIVE mg/dL
Ketones, ur: NEGATIVE mg/dL
Nitrite: NEGATIVE
Protein, ur: 30 mg/dL — AB
RBC / HPF: >50 RBC/hpf — ABNORMAL HIGH (ref 0–5)
Specific Gravity, Urine: 1.014 (ref 1.005–1.030)
pH: 6 (ref 5.0–8.0)

## 2018-04-21 MED ORDER — CYCLOBENZAPRINE HCL 10 MG PO TABS: 10.0000 mg | ORAL_TABLET | Freq: Two times a day (BID) | ORAL | 0 refills | Status: DC | PRN

## 2018-04-21 MED ORDER — LIDOCAINE HCL URETHRAL/MUCOSAL 2 % EX GEL: 1.0000 "application " | CUTANEOUS | 0 refills | Status: DC | PRN

## 2018-04-21 NOTE — Telephone Encounter (Signed)
Pt has been informed and has agreed to go to the ED.

## 2018-04-21 NOTE — ED Triage Notes (Signed)
Pt adds that he having left shoulder pains for 4-5 days. Reports did sit ups that many days ago but doesn't think that could cause the pains.  Pt tried using lidocaine patches.

## 2018-04-21 NOTE — ED Notes (Addendum)
Discharge instructions reviewed with patient. Patient verbalizes understanding. VSS- PA aware of hypertension. Patient reports he will follow up with his PCP for HTN care.

## 2018-04-21 NOTE — ED Provider Notes (Signed)
[facility] DEPT Provider Note   CSN: [id number] Arrival date & time: 04/21/18  1349     History   Chief Complaint Chief Complaint  Patient presents with  . Hematuria  . Shoulder Pain    HPI [name] is a 66 y.o. male who presents with left shoulder pain and hematuria. PMH significant for BPH with obstructive uropathy (pt self caths), CKD, HTN, HLD, gout. The patient states that he is here for 2 reasons. One is his shoulder. It started hurting 4 days ago. It is on the left side. Movement of the arm and neck make it hurt more. Tylenol helps a little bit. He also tried a lidocaine patch and icy hot without significant relief. The pain does not radiate. He does not have chest pain or SOB. He denies heavy lifting or injury. He may have slept wrong on it.   Additionally he is having blood coming from his penis when he self-caths. He's been self-cathing for about 1 year now due to enlarged prostate. He sees Dr. Pilar Jarvis with [facility]. He was seen in the ED about a month ago for the same problem. He was prescribed lidocaine but wasn't able to obtain this because of cost. He has some pain with catheterization but his main concern is the bleeding. It bleeds every time he caths. It is mostly in the beginning and he has some clots in the urine. The urine does not appear to be bloody. No fever or vomiting. He called his PCP's office yesterday and they advised him to come to the ED because they felt they couldn't adequately assess him and that he may need urology consult.   HPI  Past Medical History:  Diagnosis Date  . Anemia    normal Fe, nl B12, nl retic, nl EPO July '13  . Blood transfusion without reported diagnosis   . BPH (benign prostatic hyperplasia)   . Chronic back pain   . Chronic kidney disease    CKD III, obstructive nephropathy  . Diverticulosis   . Dysuria   . Elevated PSA, greater than or equal to 20 ng/ml June '13   PSA 107  .  Hemorrhoids, internal, with bleeding, prolapse 09/19/2014  . Hyperlipidemia 05/29/2014  . Hypertension   . Obstructive uropathy 11/27/2015  . Renal insufficiency   . Tuberculosis    h/o PPD +  . UTI (urinary tract infection)   . Vitamin D deficiency 09/13/2017    Patient Active Problem List   Diagnosis Date Noted  . Vitamin D deficiency 09/13/2017  . Urinary tract infection without hematuria 07/23/2017  . Low back pain 07/03/2017  . Dizziness 06/30/2017  . Acute gouty arthritis 12/01/2016  . Degenerative arthritis of knee, bilateral 11/18/2016  . Urinary retention due to benign prostatic hyperplasia 10/24/2016  . Mass of right side of neck 10/20/2016  . Gout 10/20/2016  . Knee pain, acute 10/12/2016  . Hypokalemia 05/28/2016  . Encounter for well adult exam with abnormal findings 11/27/2015  . Obstructive uropathy 11/27/2015  . Elevated PSA 11/27/2015  . Acute pyelonephritis 05/27/2015  . Generalized bloating 05/27/2015  . Constipation 05/27/2015  . Chest pain 05/27/2015  . AKI (acute kidney injury) ([place]) 05/27/2015  . Acute sinus infection 11/22/2014  . Cough 09/25/2014  . Hemorrhoids, internal, with bleeding, prolapse 09/19/2014  . 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 Hyperlipidemia 05/29/2014  . Lumbar radiculopathy 05/23/2014  . Lumbar stenosis 11/20/2013  . Abnormal glucose 11/06/2013  . Unspecified hereditary and idiopathic peripheral  neuropathy 01/22/2013  . 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 CKD (chronic kidney disease) stage 3, GFR 30-59 ml/min ([facility]) 11/24/2011  . Hematuria 11/24/2011  . Hydronephrosis 11/24/2011  . Anemia 11/24/2011  . BRADYCARDIA 02/05/2010  . TINEA PEDIS 01/29/2010  . 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 PERIPHERAL EDEMA 01/29/2010  . ABNORMAL ELECTROCARDIOGRAM 01/29/2010  . 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  . Osteoarthrosis, generalized, 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  . ONYCHOMYCOSIS, TOENAILS 10/02/2007  . BPH (benign  prostatic hyperplasia) 10/02/2007  . Essential hypertension 03/06/2007    Past Surgical History:  Procedure Laterality Date  . CARPAL TUNNEL RELEASE Right   . COLONOSCOPY    . HEMORRHOID BANDING    . PROSTATE SURGERY    . SPLENECTOMY          Home Medications    Prior to Admission medications   Medication Sig Start Date End Date Taking? Authorizing Provider  acetaminophen (TYLENOL) 325 MG tablet Take 2 tablets (650 mg total) by mouth every 6 (six) hours as needed. Patient taking differently: Take 650 mg by mouth every 6 (six) hours as needed for mild pain, moderate pain or headache.  05/23/15  Yes Varney Biles, MD  allopurinol (ZYLOPRIM) 100 MG tablet TAKE 1 TABLET BY MOUTH ONCE DAILY 11/02/17  Yes Biagio Borg, MD  aspirin 81 MG tablet Take 81 mg by mouth daily.   Yes [provider]  atorvastatin (LIPITOR) 40 MG tablet TAKE 1 TABLET BY MOUTH ONCE DAILY Patient taking differently: Take 40 mg by mouth daily at 6 PM.  01/10/18  Yes Biagio Borg, MD  finasteride (PROSCAR) 5 MG tablet TAKE ONE TABLET BY MOUTH ONCE DAILY Patient taking differently: Take 5 mg by mouth daily as needed (prostate).  08/08/17  Yes Biagio Borg, MD  metoprolol tartrate (LOPRESSOR) 25 MG tablet Take 1 tablet (25 mg total) by mouth 2 (two) times daily. 12/29/16  Yes Biagio Borg, MD  Misc Natural Products (TART CHERRY ADVANCED PO) Take 1 tablet by mouth daily.   Yes [provider]  POTASSIUM PO Take 1 tablet by mouth daily.   Yes [provider]  colchicine 0.6 MG tablet TAKE 1 TABLET BY MOUTH ONCE DAILY Patient not taking: Reported on 04/21/2018 02/24/18   Biagio Borg, MD  Lidocaine, Anorectal, 5 % GEL Apply 1 application topically as needed (use as needed for urinary catheterization). 03/11/18   Carmin Muskrat, MD  oxyCODONE-acetaminophen (PERCOCET/ROXICET) 5-325 MG tablet Take 1-2 tablets by mouth every 6 (six) hours as needed for severe pain. Patient not taking: Reported on  04/21/2018 01/24/18   Petrucelli, Glynda Jaeger, PA-C  predniSONE (DELTASONE) 10 MG tablet 3 tabs by mouth per day for 3 days,2tabs per day for 3 days,1tab per day for 3 days Patient not taking: Reported on 04/21/2018 03/24/18   Biagio Borg, MD    Family History Family History  Problem Relation Age of Onset  . Diabetes Mother   . Stroke Brother   . Hearing loss Brother   . Colon cancer Neg Hx   . Rectal cancer Neg Hx   . Stomach cancer Neg Hx   . Esophageal cancer Neg Hx     Social History Social History   Tobacco Use  . Smoking status: Former Smoker    Types: Cigarettes  . Smokeless tobacco: Never Used  . Tobacco comment: Quit 1981  Substance Use Topics  . Alcohol use: No    Alcohol/week: 0.0 standard drinks  Comment: Quit 2004  . Drug use: No     Allergies   Patient has no known allergies.   Review of Systems Review of Systems  Constitutional: Negative for fever.  Respiratory: Negative for shortness of breath.   Cardiovascular: Negative for chest pain.  Gastrointestinal: Negative for abdominal pain and vomiting.  Genitourinary: Positive for difficulty urinating, discharge (bloody) and hematuria. Negative for dysuria, flank pain, penile pain and testicular pain.  Musculoskeletal: Positive for myalgias and neck pain. Negative for arthralgias.  All other systems reviewed and are negative.    Physical Exam Updated Vital Signs BP (!) 139/98 (BP Location: Right Arm)   Pulse 65   Temp 97.6 F (36.4 C) (Oral)   Resp 18   SpO2 99%   Physical Exam  Constitutional: He is oriented to person, place, and time. He appears well-developed and well-nourished. No distress.  HENT:  Head: Normocephalic and atraumatic.  Eyes: Pupils are equal, round, and reactive to light. Conjunctivae are normal. Right eye exhibits no discharge. Left eye exhibits no discharge. No scleral icterus.  Neck: Normal range of motion.  Cardiovascular: Normal rate and regular rhythm.    Pulmonary/Chest: Effort normal and breath sounds normal. No respiratory distress.  Abdominal: He exhibits no distension.  Genitourinary:  Genitourinary Comments: I was present while the patient performed self-cath. There was initially a small amount of blood at the beginning of the stream. The rest of the urine was clear. He did not have significant pain with self-cath. After pulling the catheter out of the penis there was a small clot and some blood at the end. Chaperone was present. (Zvi, NT)  Musculoskeletal:  Left shoulder: No obvious swelling or deformity of the shoulder. Tenderness to palpation of left cervical paraspinal muscles and trapezius. FROM of shoulder. 5/5 grip strength. N/V intact.   Neurological: He is alert and oriented to person, place, and time.  Skin: Skin is warm and dry.  Psychiatric: He has a normal mood and affect. His behavior is normal.  Nursing note and vitals reviewed.    ED Treatments / Results  Labs (all labs ordered are listed, but only abnormal results are displayed) Labs Reviewed  URINALYSIS, ROUTINE W REFLEX MICROSCOPIC - Abnormal; Notable for the following components:      Result Value   APPearance HAZY (*)    Hgb urine dipstick LARGE (*)    Protein, ur 30 (*)    Leukocytes, UA TRACE (*)    RBC / HPF >50 (*)    Bacteria, UA RARE (*)    All other components within normal limits  URINE CULTURE    EKG None  Radiology No results found.  Procedures Procedures (including critical care time)  Medications Ordered in ED Medications - No data to display   Initial Impression / Assessment and Plan / ED Course  I have reviewed the triage vital signs and the nursing notes.  Pertinent labs & imaging results that were available during my care of the patient were reviewed by me and considered in my medical decision making (see chart for details).  67 year old male presents with L shoulder pain and hematuria. He is hypertensive but otherwise vitals  are normal. On exam his L shoulder pain is muscular. He has reproducible tenderness over the trapezius and neck and it hurts with movement. He does not have chest pain. Advised continuing OTC and topical meds. Will add Flexeril.  Regarding his hematuria. It appears to be from a urethral source. He does  not have significant pain. Doubt a stricture. UA here shows large hgb, trace leukocytes, 30 protein, >50 RBC, 6-10 WBC. Will send off culture. I think he likely has a small injury from doing multiple caths. He has a urologist and was encouraged to f/u with them. He was given strict return precautions.  Final Clinical Impressions(s) / ED Diagnoses   Final diagnoses:  Gross hematuria  Acute pain of left shoulder    ED Discharge Orders    None       Iris Pert 04/21/18 2221    Jola Schmidt, MD 04/24/18 [phone]

## 2018-04-21 NOTE — ED Triage Notes (Signed)
Pt reports that he noticed when self cath, will notice bright red blood on end of catheter when removing it for the past 3-4 days. Reports has been self cathing since March-April this year. Is taking ASA daily. Pt c/o pains inside urinary tract and has some resistance.

## 2018-04-21 NOTE — Discharge Instructions (Addendum)
Please try Flexeril (muscle relaxer) for your shoulder pain along with continuing Tylenol. You can also try a heating pad or massage Please make an appointment with your urologist for follow up. Please return if you develop a fever, vomiting, or have a lot more bleeding and clots

## 2018-04-23 LAB — URINE CULTURE: Culture: >=100000 — AB

## 2018-04-24 ENCOUNTER — Telehealth: Payer: Self-pay | Admitting: *Deleted

## 2018-04-24 ENCOUNTER — Other Ambulatory Visit: Payer: Self-pay | Admitting: Otolaryngology

## 2018-04-24 DIAGNOSIS — R221 Localized swelling, mass and lump, neck: Secondary | ICD-10-CM

## 2018-04-24 NOTE — Progress Notes (Signed)
ED Antimicrobial Stewardship Positive Culture Follow Up   [name] is an 67 y.o. male who presented to [facility] on 04/21/2018 with a chief complaint of  Chief Complaint  Patient presents with  . Hematuria  . Shoulder Pain    Recent Results (from the past 720 hour(s))  Urine culture     Status: Abnormal   Collection Time: 04/21/18  3:58 PM  Result Value Ref Range Status   Specimen Description   Final    URINE, RANDOM Performed at [facility] [address] [place], [place] [address]    Special Requests   Final    NONE Performed at [facility], [place] [address], [place], [place] [address]    Culture >=100,000 COLONIES/mL ENTEROCOCCUS FAECALIS (A)  Final   Report Status 04/23/2018 FINAL  Final   Organism ID, Bacteria ENTEROCOCCUS FAECALIS (A)  Final      Susceptibility   Enterococcus faecalis - MIC*    AMPICILLIN <=2 SENSITIVE Sensitive     LEVOFLOXACIN 1 SENSITIVE Sensitive     NITROFURANTOIN <=16 SENSITIVE Sensitive     VANCOMYCIN 2 SENSITIVE Sensitive     * >=100,000 COLONIES/mL ENTEROCOCCUS FAECALIS    []  Treated with N/A, organism resistant to prescribed antimicrobial [x]  Patient discharged originally without antimicrobial agent and treatment is now indicated  New antibiotic prescription: If pt with UTI symptoms, treat with amoxicillin 500mg  PO TID x 5 days  ED Provider: Lenn Sink, PA   [name], [name] 04/24/2018, 10:39 AM Clinical Pharmacist Monday - Friday phone -  [phone] Saturday - Sunday phone - [phone]

## 2018-04-24 NOTE — Telephone Encounter (Signed)
Post ED Visit - Positive Culture Follow-up: Successful Patient Follow-Up  Culture assessed and recommendations reviewed by:  []  Elenor Quinones, Pharm.D. []  Heide Guile, Pharm.D., BCPS AQ-ID []  Parks Neptune, Pharm.D., BCPS []  Alycia Rossetti, Pharm.D., BCPS []  [place], Pharm.D., BCPS, AAHIVP []  Legrand Como, Pharm.D., BCPS, AAHIVP [x]  Salome Arnt, PharmD, BCPS []  Johnnette Gourd, PharmD, BCPS []  Hughes Better, PharmD, BCPS []  Leeroy Cha, PharmD  Positive urine culture  [x]  Patient discharged without antimicrobial prescription and treatment is now indicated []  Organism is resistant to prescribed ED discharge antimicrobial []  Patient with positive blood cultures  Changes discussed with ED provider:Jeff Hedges, PA-C New antibiotic prescription Amoxicillin 500mg  PO TID x 5 days Called to [facility]  Contacted patient, date 04/24/2018, time [place] 04/24/2018, 11:02 AM

## 2018-04-27 ENCOUNTER — Ambulatory Visit
Admission: RE | Admit: 2018-04-27 | Discharge: 2018-04-27 | Disposition: A | Discharge status: Home / Self Care | Payer: Medicare Other | Source: Ambulatory Visit | Attending: Otolaryngology | Admitting: Otolaryngology

## 2018-04-27 DIAGNOSIS — D4989 Neoplasm of unspecified behavior of other specified sites: Secondary | ICD-10-CM | POA: Diagnosis not present

## 2018-04-27 DIAGNOSIS — R221 Localized swelling, mass and lump, neck: Secondary | ICD-10-CM

## 2018-05-10 ENCOUNTER — Telehealth: Payer: Self-pay | Admitting: Internal Medicine

## 2018-05-10 MED ORDER — ALLOPURINOL 100 MG PO TABS: 100.0000 mg | ORAL_TABLET | Freq: Every day | ORAL | 3 refills | Status: DC

## 2018-05-10 NOTE — Telephone Encounter (Signed)
Patient says he was told to increase pills to 2 per day and that's why he ran out. No note to support this found at last OV note.

## 2018-05-10 NOTE — Telephone Encounter (Signed)
Copied from [place] [id number]. Topic: Quick Communication - Rx Refill/Question >> May 10, 2018  9:47 AM Burchel, Abbi R wrote: Medication: allopurinol (ZYLOPRIM) 100 MG tablet  Pt states he was instructed to increase the dosage of this medication to 2 tabs twice daily, and now needs a refill sooner than expected.  Please advise.   Preferred Pharmacy: [place] [facility], [address] [place] [address] Phone: [phone] Fax: [phone]  Pt was advised that RX refills may take up to 3 business days. We ask that you follow-up with your pharmacy.

## 2018-05-12 MED ORDER — ALLOPURINOL 100 MG PO TABS: 200.0000 mg | ORAL_TABLET | Freq: Every day | ORAL | 3 refills | Status: DC

## 2018-05-12 NOTE — Addendum Note (Signed)
Addended by: Biagio Borg on: 05/12/2018 12:58 PM   Modules accepted: Orders

## 2018-05-12 NOTE — Telephone Encounter (Signed)
Ok - done erx for 2 tabs per day

## 2018-05-12 NOTE — Telephone Encounter (Signed)
Pt called because pharmacy cannot fill RX for allopurinol. I called [facility] and Lonn [place] stated pt refilled 03/16/18 for #90 with instructions to take 1/day and it is too early for refill. Pt stated he increased use to 2/day and asking if Dr. Jenny Reichmann will change RX. Please advise.

## 2018-05-30 ENCOUNTER — Other Ambulatory Visit: Payer: Self-pay | Admitting: Internal Medicine

## 2018-06-02 ENCOUNTER — Ambulatory Visit: Payer: Self-pay

## 2018-06-02 ENCOUNTER — Encounter: Payer: Self-pay | Admitting: Family Medicine

## 2018-06-02 ENCOUNTER — Ambulatory Visit (INDEPENDENT_AMBULATORY_CARE_PROVIDER_SITE_OTHER): Payer: Medicare Other | Admitting: Family Medicine

## 2018-06-02 VITALS — BP 138/88 | HR 69 | Ht 73.0 in | Wt 249.0 lb

## 2018-06-02 DIAGNOSIS — M25561 Pain in right knee: Secondary | ICD-10-CM

## 2018-06-02 DIAGNOSIS — M109 Gout, unspecified: Secondary | ICD-10-CM

## 2018-06-02 NOTE — Assessment & Plan Note (Signed)
Likely an acute exacerbation of his underlying gouty change.  Last uric acid was elevated at 8.0.  May need to increase the allopurinol.  Staying away from anti-inflammatories with his elevated creatinine -Aspiration and injection today. -May need to give a course of prednisone if pain is still occurring.

## 2018-06-02 NOTE — Patient Instructions (Signed)
Nice to meet you  Please try the naproxen if you still have pain  Please try ice on the knee  Please see me back in 1-2 weeks if you still have pain  Happy holidays.

## 2018-06-02 NOTE — Progress Notes (Signed)
[name] - 67 y.o. male MRN [id number]  Date of birth: [birth year]  SUBJECTIVE:  Including CC & ROS.  No chief complaint on file.   [name] is a 67 y.o. male that is presenting with a right knee pain.  This pain started 2 days ago.  The pain is over the entire knee.  Is localized to the knee.  He is having some swelling and warmth.  He has a history of gout.  He takes allopurinol on a regular basis.  He cannot afford to take the colchicine.  He is creatinine is usually elevated so staying away from anti-inflammatories.  Denies any mechanical symptoms.  Denies any inciting event.  Denies any fevers.  Has pain at rest.  Pain is sharp and stabbing.  Pain is moderate to severe.  Independent review of the right knee x-ray from 8/20 shows degenerative changes of the medial lateral joint line.   Review of Systems  Constitutional: Negative for fever.  HENT: Negative for congestion.   Respiratory: Negative for cough.   Cardiovascular: Negative for chest pain.  Gastrointestinal: Negative for abdominal pain.  Musculoskeletal: Positive for arthralgias, gait problem and joint swelling.  Skin: Negative for color change.  Neurological: Negative for weakness.  Hematological: Negative for adenopathy.  Psychiatric/Behavioral: Negative for agitation.    HISTORY: Past Medical, Surgical, Social, and Family History Reviewed & Updated per EMR.   Pertinent Historical Findings include:  Past Medical History:  Diagnosis Date  . Anemia    normal Fe, nl B12, nl retic, nl EPO July '13  . Blood transfusion without reported diagnosis   . BPH (benign prostatic hyperplasia)   . Chronic back pain   . Chronic kidney disease    CKD III, obstructive nephropathy  . Diverticulosis   . Dysuria   . Elevated PSA, greater than or equal to 20 ng/ml June '13   PSA 107  . Hemorrhoids, internal, with bleeding, prolapse 09/19/2014  . Hyperlipidemia 05/29/2014  . Hypertension   . Obstructive uropathy 11/27/2015  .  Renal insufficiency   . Tuberculosis    h/o PPD +  . UTI (urinary tract infection)   . Vitamin D deficiency 09/13/2017    Past Surgical History:  Procedure Laterality Date  . CARPAL TUNNEL RELEASE Right   . COLONOSCOPY    . HEMORRHOID BANDING    . PROSTATE SURGERY    . SPLENECTOMY      No Known Allergies  Family History  Problem Relation Age of Onset  . Diabetes Mother   . Stroke Brother   . Hearing loss Brother   . Colon cancer Neg Hx   . Rectal cancer Neg Hx   . Stomach cancer Neg Hx   . Esophageal cancer Neg Hx      Social History   Socioeconomic History  . Marital status: Married    Spouse name: Not on file  . Number of children: 2  . Years of education: 13  . Highest education level: Not on file  Occupational History  . 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 Financial resource strain: Not on file  . Food insecurity:    Worry: Not on file    Inability: Not on file  . Transportation needs:    Medical: Not on file    Non-medical: Not on file  Tobacco Use  . Smoking status: Former Smoker    Types: Cigarettes  . Smokeless tobacco: Never  Used  . Tobacco comment: Quit 1981  Substance and Sexual Activity  . Alcohol use: No    Alcohol/week: 0.0 standard drinks    Comment: Quit 2004  . Drug use: No  . Sexual activity: Never  Lifestyle  . Physical activity:    Days per week: Not on file    Minutes per session: Not on file  . Stress: Not on file  Relationships  . Social connections:    Talks on phone: Not on file    Gets together: Not on file    Attends religious service: Not on file    Active member of club or organization: Not on file    Attends meetings of clubs or organizations: Not on file    Relationship status: Not on file  . Intimate partner violence:    Fear of current or ex partner: Not on file    Emotionally abused: Not on file    Physically abused: Not on file    Forced sexual  activity: Not on file  Other Topics Concern  . Not on file  Social History Narrative   Native of [place], raised poor farming community. . To [place] '78 - finished College, all but Arts administrator. Married - wife in [place], blocked from [place] to [place]. 1 son '90; 1 dtr '93. 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 Resources depleted.   Right handed.   Caffeine Hot daily one daily.     PHYSICAL EXAM:  VS: BP 138/88   Pulse 69   Ht 6\' 1"  (1.854 m)   Wt 249 lb (112.9 kg)   SpO2 96%   BMI 32.85 kg/m  Physical Exam Gen: NAD, alert, cooperative with exam, well-appearing ENT: normal lips, normal nasal mucosa,  Eye: normal EOM, normal conjunctiva and lids CV:  no edema, +2 pedal pulses   Resp: no accessory muscle use, non-labored,  Skin: no rashes, no areas of induration  Neuro: normal tone, normal sensation to touch Psych:  normal insight, alert and oriented MSK:  Right knee: Mild effusion. No significant tenderness to palpation of the medial lateral joint line. Normal range of motion. Normal strength resistance. Some warmth appreciated over the joint. Negative McMurray's test. Neurovascular intact    Aspiration/Injection Procedure Note [name] Aug 25, 1950  Procedure: Injection Indications: right knee pain   Procedure Details Consent: Risks of procedure as well as the alternatives and risks of each were explained to the (patient/caregiver).  Consent for procedure obtained. Time Out: Verified patient identification, verified procedure, site/side was marked, verified correct patient position, special equipment/implants available, medications/allergies/relevent history reviewed, required imaging and test results available.  Performed.  The area was cleaned with iodine and alcohol swabs.    The right knee superior lateral suprapatellar pouch was injected 4 cc of 1% lidocaine to anesthetize the skin in the track on a 25-gauge inch and half  needle.  An 18-gauge inch and a half needle was inserted under ultrasound guidance to achieve aspiration.  The syringe was then switched and a mixture containing 1 cc's of 40 mg Kenalog and 4 cc's of 0.5% bupivacaine was injected.  Ultrasound was used. Images were obtained in Long views showing the injection.    Amount of Fluid Aspirated: 6mL Character of Fluid: clear and straw colored Fluid was sent for:n/a  A sterile dressing was applied.  Patient did tolerate procedure well.    ASSESSMENT & PLAN:   Acute gouty arthritis Likely an acute exacerbation of his underlying gouty  change.  Last uric acid was elevated at 8.0.  May need to increase the allopurinol.  Staying away from anti-inflammatories with his elevated creatinine -Aspiration and injection today. -May need to give a course of prednisone if pain is still occurring.

## 2018-06-10 ENCOUNTER — Other Ambulatory Visit: Payer: Self-pay | Admitting: Internal Medicine

## 2018-07-04 ENCOUNTER — Encounter: Payer: Self-pay | Admitting: Internal Medicine

## 2018-07-04 ENCOUNTER — Ambulatory Visit (INDEPENDENT_AMBULATORY_CARE_PROVIDER_SITE_OTHER): Payer: Medicare Other | Admitting: Internal Medicine

## 2018-07-04 VITALS — BP 118/78 | HR 58 | Temp 97.8°F | Ht 73.0 in | Wt 248.0 lb

## 2018-07-04 DIAGNOSIS — M109 Gout, unspecified: Secondary | ICD-10-CM | POA: Diagnosis not present

## 2018-07-04 DIAGNOSIS — E785 Hyperlipidemia, unspecified: Secondary | ICD-10-CM | POA: Diagnosis not present

## 2018-07-04 DIAGNOSIS — R079 Chest pain, unspecified: Secondary | ICD-10-CM | POA: Diagnosis not present

## 2018-07-04 DIAGNOSIS — I10 Essential (primary) hypertension: Secondary | ICD-10-CM | POA: Diagnosis not present

## 2018-07-04 DIAGNOSIS — Z0001 Encounter for general adult medical examination with abnormal findings: Secondary | ICD-10-CM | POA: Diagnosis not present

## 2018-07-04 MED ORDER — ZOSTER VAC RECOMB ADJUVANTED 50 MCG/0.5ML IM SUSR: 0.5000 mL | Freq: Once | INTRAMUSCULAR | 1 refills | Status: AC

## 2018-07-04 NOTE — Progress Notes (Signed)
Subjective:    Patient ID: [name], male    DOB: Aug 20, 1950, 68 y.o.   MRN: [id number]  HPI  Here for wellness and f/u;  Overall doing ok;  Pt denies worsening SOB, DOE, wheezing, orthopnea, PND, worsening LE edema, palpitations, dizziness or syncope.  Pt denies neurological change such as new headache, facial or extremity weakness.  Pt denies polydipsia, polyuria, or low sugar symptoms. Pt states overall good compliance with treatment and medications, good tolerability, and has been trying to follow appropriate diet.  Pt denies worsening depressive symptoms, suicidal ideation or panic. No fever, night sweats, wt loss, loss of appetite, or other constitutional symptoms.  Pt states good ability with ADL's, has low fall risk, home safety reviewed and adequate, no other significant changes in hearing or vision, and  occasionally active with exercise, tries to walk 2 miles per day, but gout recently has been omore active despite the 200 mg allopurinol, cannot afford the colchicine, and not able for nsaid due to ckd.   Wt Readings from Last 3 Encounters:  07/04/18 248 lb (112.5 kg)  06/02/18 249 lb (112.9 kg)  03/21/18 245 lb (111.1 kg)   BP Readings from Last 3 Encounters:  07/04/18 118/78  06/02/18 138/88  04/21/18 (!) 170/114  Does c/o intermittent left CP, dull with some radiation numb like to the left shoulder, mild, intermittent for several months.  Has listed hx of CM due to HTN, no known CAD.  Is planning possible trip to [place] soon, wife has been there for 7 yrs. Past Medical History:  Diagnosis Date  . Anemia    normal Fe, nl B12, nl retic, nl EPO July '13  . Blood transfusion without reported diagnosis   . BPH (benign prostatic hyperplasia)   . Chronic back pain   . Chronic kidney disease    CKD III, obstructive nephropathy  . Diverticulosis   . Dysuria   . Elevated PSA, greater than or equal to 20 ng/ml June '13   PSA 107  . Hemorrhoids, internal, with bleeding, prolapse  09/19/2014  . Hyperlipidemia 05/29/2014  . Hypertension   . Obstructive uropathy 11/27/2015  . Renal insufficiency   . Tuberculosis    h/o PPD +  . UTI (urinary tract infection)   . Vitamin D deficiency 09/13/2017   Past Surgical History:  Procedure Laterality Date  . CARPAL TUNNEL RELEASE Right   . COLONOSCOPY    . HEMORRHOID BANDING    . PROSTATE SURGERY    . SPLENECTOMY      reports that he has quit smoking. His smoking use included cigarettes. He has never used smokeless tobacco. He reports that he does not drink alcohol or use drugs. family history includes Diabetes in his mother; Hearing loss in his brother; Stroke in his brother. No Known Allergies Current Outpatient Medications on File Prior to Visit  Medication Sig Dispense Refill  . acetaminophen (TYLENOL) 325 MG tablet Take 2 tablets (650 mg total) by mouth every 6 (six) hours as needed. (Patient taking differently: Take 650 mg by mouth every 6 (six) hours as needed for mild pain, moderate pain or headache. ) 12 tablet 0  . allopurinol (ZYLOPRIM) 100 MG tablet Take 2 tablets (200 mg total) by mouth daily. 180 tablet 3  . aspirin 81 MG tablet Take 81 mg by mouth daily.    Marland Kitchen atorvastatin (LIPITOR) 40 MG tablet TAKE 1 TABLET BY MOUTH ONCE DAILY (Patient taking differently: Take 40 mg by mouth daily at 6  PM. ) 90 tablet 3  . cyclobenzaprine (FLEXERIL) 10 MG tablet Take 1 tablet (10 mg total) by mouth 2 (two) times daily as needed for muscle spasms. 20 tablet 0  . finasteride (PROSCAR) 5 MG tablet TAKE 1 TABLET BY MOUTH ONCE DAILY 90 tablet 0  . lidocaine (XYLOCAINE) 2 % jelly Place 1 application into the urethra as needed. 30 mL 0  . metoprolol tartrate (LOPRESSOR) 25 MG tablet TAKE 1 TABLET BY MOUTH TWICE DAILY 180 tablet 0  . Misc Natural Products (TART CHERRY ADVANCED PO) Take 1 tablet by mouth daily.    Marland Kitchen POTASSIUM PO Take 1 tablet by mouth daily.     No current facility-administered medications on file prior to visit.     Review of Systems Constitutional: Negative for other unusual diaphoresis, sweats, appetite or weight changes HENT: Negative for other worsening hearing loss, ear pain, facial swelling, mouth sores or neck stiffness.   Eyes: Negative for other worsening pain, redness or other visual disturbance.  Respiratory: Negative for other stridor or swelling Cardiovascular: Negative for other palpitations or other chest pain  Gastrointestinal: Negative for worsening diarrhea or loose stools, blood in stool, distention or other pain Genitourinary: Negative for hematuria, flank pain or other change in urine volume.  Musculoskeletal: Negative for myalgias or other joint swelling.  Skin: Negative for other color change, or other wound or worsening drainage.  Neurological: Negative for other syncope or numbness. Hematological: Negative for other adenopathy or swelling Psychiatric/Behavioral: Negative for hallucinations, other worsening agitation, SI, self-injury, or new decreased concentration All other system neg per pt    Objective:   Physical Exam BP 118/78   Pulse (!) 58   Temp 97.8 F (36.6 C) (Oral)   Ht 6\' 1"  (1.854 m)   Wt 248 lb (112.5 kg)   SpO2 98%   BMI 32.72 kg/m  VS noted,  Constitutional: Pt is oriented to person, place, and time. Appears well-developed and well-nourished, in no significant distress and comfortable Head: Normocephalic and atraumatic  Eyes: Conjunctivae and EOM are normal. Pupils are equal, round, and reactive to light Right Ear: External ear normal without discharge Left Ear: External ear normal without discharge Nose: Nose without discharge or deformity Mouth/Throat: Oropharynx is without other ulcerations and moist  Neck: Normal range of motion. Neck supple. No JVD present. No tracheal deviation present or significant neck LA or mass Cardiovascular: Normal rate, regular rhythm, normal heart sounds and intact distal pulses.   Pulmonary/Chest: WOB normal and  breath sounds without rales or wheezing  Abdominal: Soft. Bowel sounds are normal. NT. No HSM  Musculoskeletal: Normal range of motion. Exhibits no edema Lymphadenopathy: Has no other cervical adenopathy.  Neurological: Pt is alert and oriented to person, place, and time. Pt has normal reflexes. No cranial nerve deficit. Motor grossly intact, Gait intact Skin: Skin is warm and dry. No rash noted or new ulcerations Psychiatric:  Has normal mood and affect. Behavior is normal without agitation Right knee with trace effusion, warmth No other exam findings  Lab Results  Component Value Date   WBC 7.4 03/21/2018   HGB 13.3 03/21/2018   HCT 42.7 03/21/2018   PLT 281 03/21/2018   GLUCOSE 100 (H) 03/21/2018   CHOL 134 06/30/2017   TRIG 67.0 06/30/2017   HDL 52.90 06/30/2017   LDLCALC 68 06/30/2017   ALT 16 06/30/2017   AST 18 06/30/2017   NA 142 03/21/2018   K 4.6 03/21/2018   CL 102 03/21/2018   CREATININE  2.08 (H) 03/21/2018   BUN 30 (H) 03/21/2018   CO2 33 (H) 03/21/2018   TSH 1.82 06/30/2017   PSA 9.64 (H) 06/30/2017   INR 1.08 05/12/2012   HGBA1C 5.9 06/30/2017   ECG today I have personally intepreted NSR with 3 beat run PAT, nonspecific T wave changes     Assessment & Plan:

## 2018-07-04 NOTE — Assessment & Plan Note (Signed)
stable overall by history and exam, recent data reviewed with pt, and pt to continue medical treatment as before,  to f/u any worsening symptoms or concerns  

## 2018-07-04 NOTE — Assessment & Plan Note (Signed)

## 2018-07-04 NOTE — Assessment & Plan Note (Addendum)
Atypical, to be scheduled for nuc med stress test  In addition to the time spent performing CPE, I spent an additional 25 minutes face to face,in which greater than 50% of this time was spent in counseling and coordination of care for patient's acute illness as documented, including the differential dx, treatment, further evaluation and other management of chest pain, HTN, HLD, and acute gouty arthritis

## 2018-07-04 NOTE — Assessment & Plan Note (Signed)
Chronic persistent mild worse recently, plans to f/u with Dr Tamala Julian for possible repeat cortisone

## 2018-07-04 NOTE — Patient Instructions (Addendum)
Your shingles shot prescription was sent to the pharmacy  Your EKG was OK today  You will be contacted regarding the referral for: Heart Stress test  Please continue all other medications as before, and refills have been done if requested.  Please have the pharmacy call with any other refills you may need.  Please continue your efforts at being more active, low cholesterol diet, and weight control.  You are otherwise up to date with prevention measures today.  Please keep your appointments with your specialists as you may have planned  Please go to the LAB in the Basement (turn left off the elevator) for the tests to be done tomorrow  You will be contacted by phone if any changes need to be made immediately.  Otherwise, you will receive a letter about your results with an explanation, but please check with MyChart first.  Please remember to sign up for MyChart if you have not done so, as this will be important to you in the future with finding out test results, communicating by private email, and scheduling acute appointments online when needed.  Please return in 6 months, or sooner if needed

## 2018-07-04 NOTE — Assessment & Plan Note (Signed)
stable overall by history and exam, recent data reviewed with pt, and pt to continue medical treatment as before,  to f/u any worsening symptoms or concerns, for f/u lipids with labs 

## 2018-07-13 ENCOUNTER — Encounter: Payer: Self-pay | Admitting: Internal Medicine

## 2018-07-18 ENCOUNTER — Telehealth: Payer: Self-pay

## 2018-07-18 NOTE — Telephone Encounter (Signed)
New message    Just an FYI.  We have made several attempts to contact this patient including sending a letter to schedule or reschedule their MYOCARDIAL PERFUSION. We will be removing the patient from the WQ.   Thank you 

## 2018-08-04 IMAGING — DX DG KNEE 1-2V*R*
2 series · 2 of 2 positions shown · non-contrast
Comparison: Right knee series of December 04, 2007

CLINICAL DATA: Right knee pain and swelling for the past 2 weeks
without known injury

EXAM:
RIGHT KNEE - 1-2 VIEW

[knee ap]
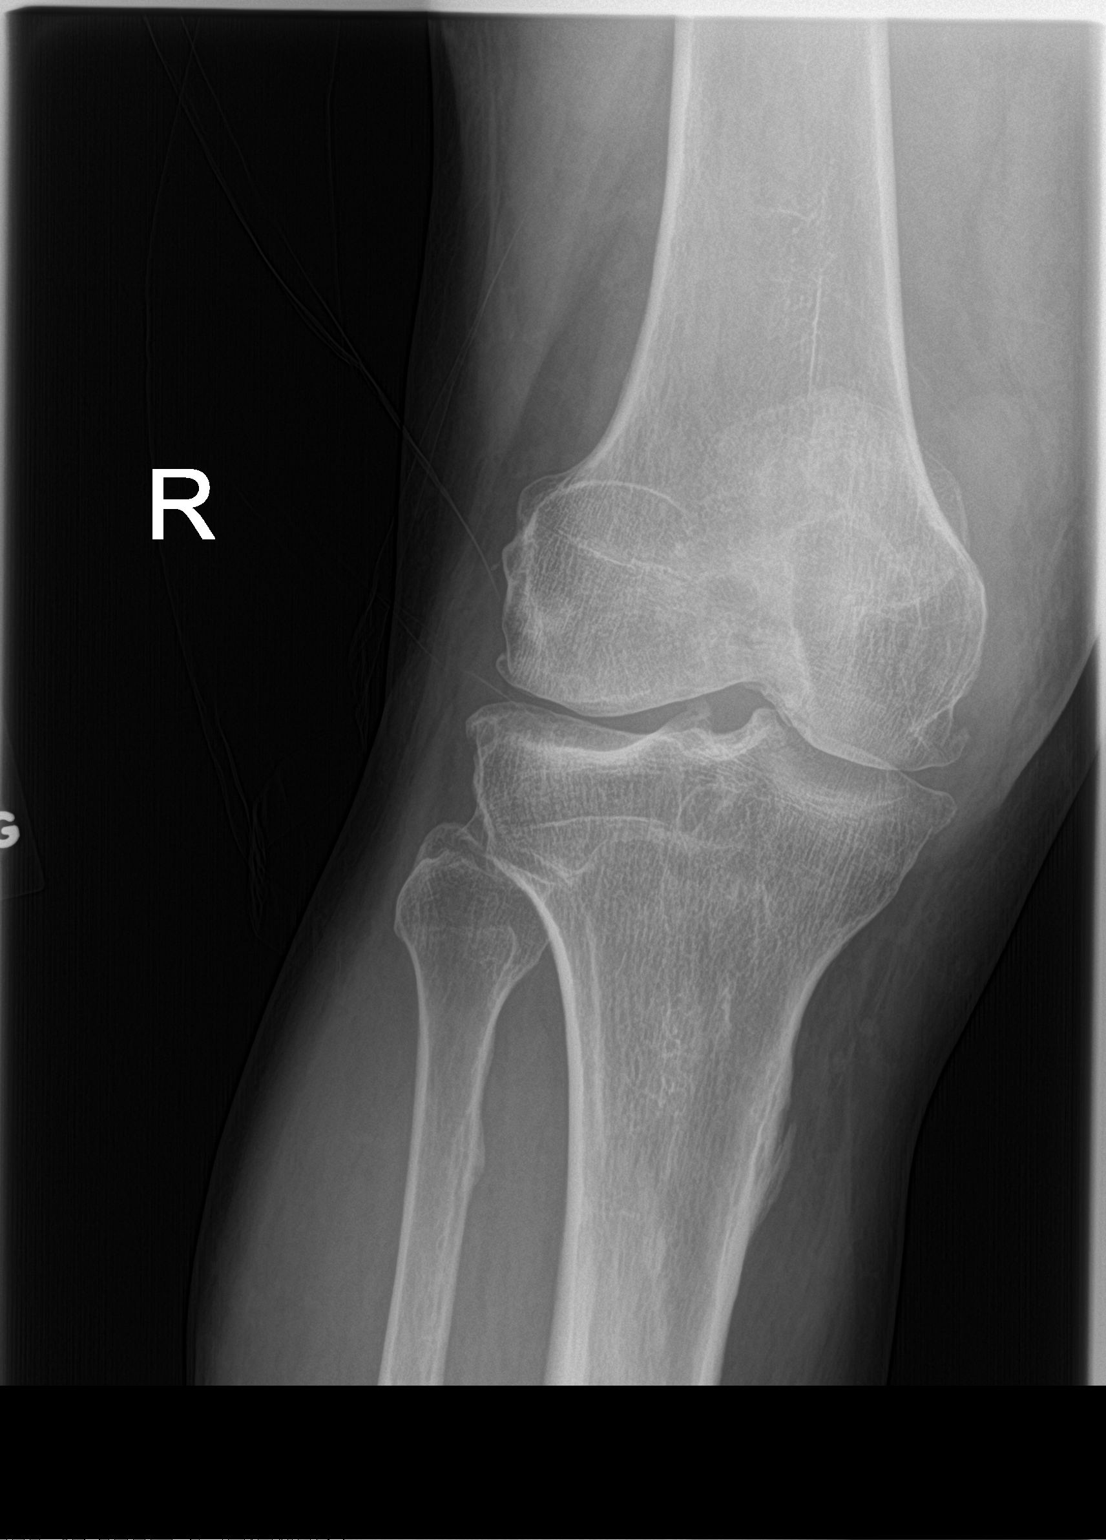

[knee lat]
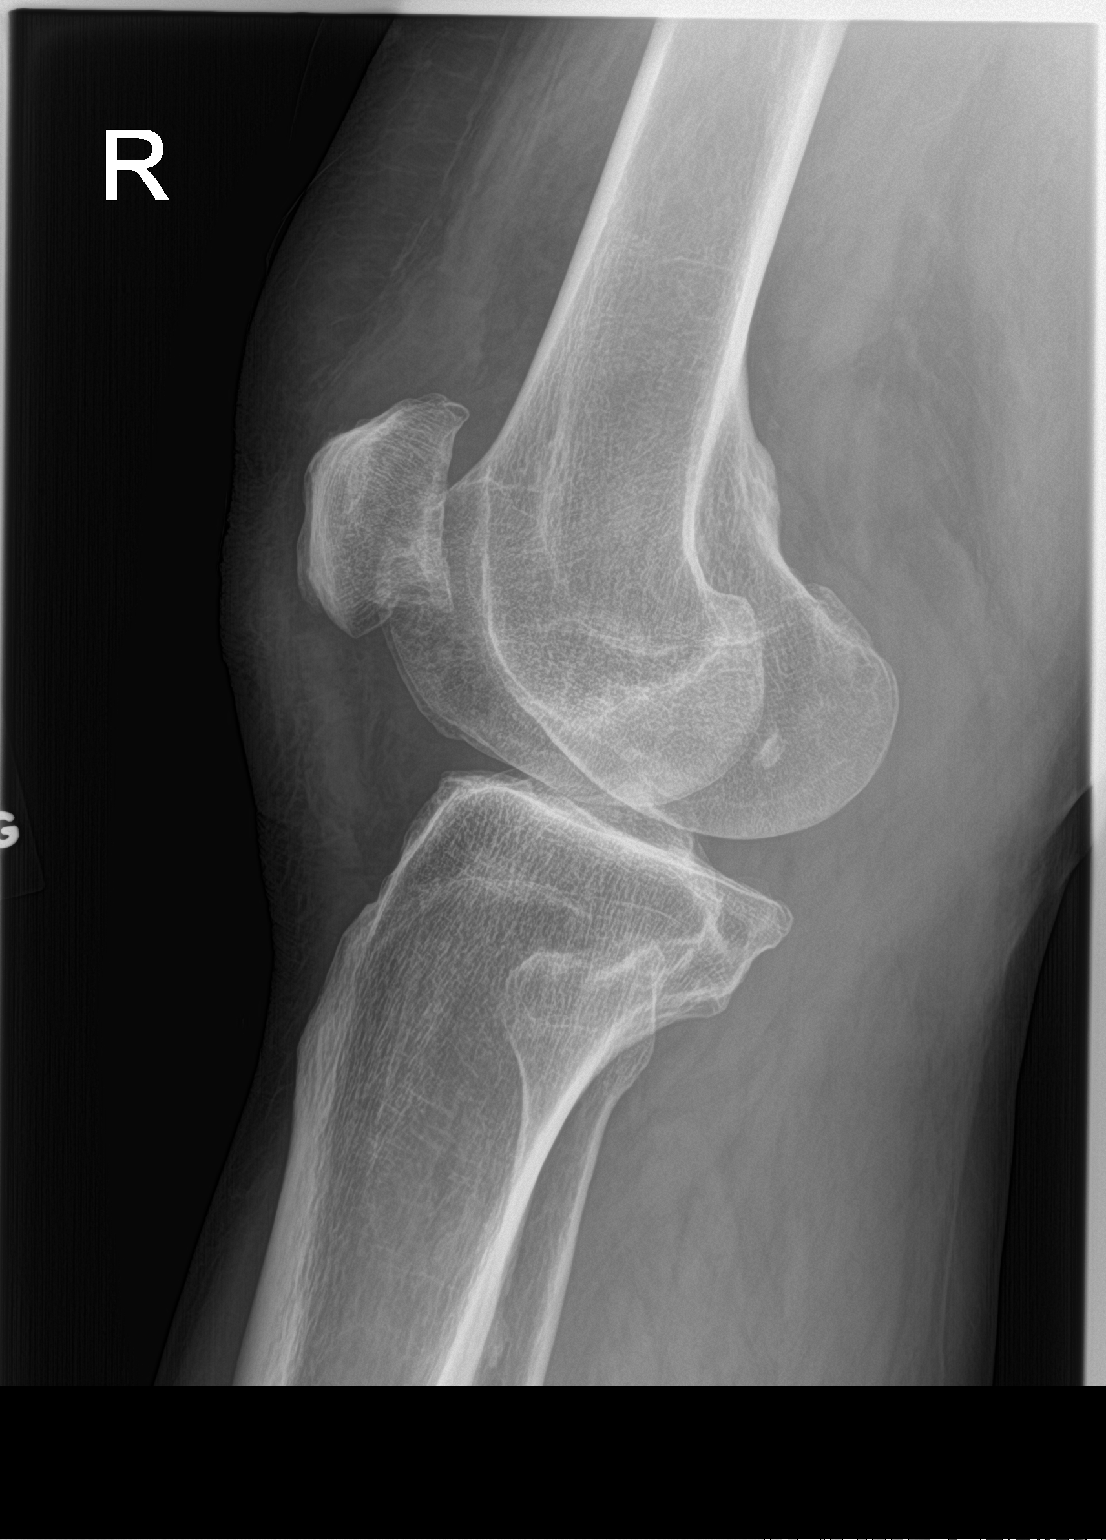

[2 of 2 positions shown; findings below may reference images not displayed]

FINDINGS: The bones are subjectively adequately mineralized. The joint spaces
are reasonably well-maintained. There is beaking of the tibial
spines. Spurs arise from the articular margins of the femoral
condyles and the tibial plateaus. Spurs arise from the articular
margins of the patella. There is no joint effusion. No acute
fracture or dislocation is observed.
IMPRESSION: Mild tricompartmental osteoarthritic change manifested by osteophyte
formation without significant joint space loss. No acute bony
abnormality.

## 2018-08-21 DIAGNOSIS — R339 Retention of urine, unspecified: Secondary | ICD-10-CM | POA: Diagnosis not present

## 2018-08-23 ENCOUNTER — Other Ambulatory Visit: Payer: Self-pay | Admitting: Internal Medicine

## 2018-09-19 ENCOUNTER — Telehealth: Payer: Self-pay | Admitting: *Deleted

## 2018-09-19 NOTE — Telephone Encounter (Signed)
Called patient to schedule AWV, no one answered the phone and unable to LVM.

## 2018-10-03 DIAGNOSIS — R339 Retention of urine, unspecified: Secondary | ICD-10-CM | POA: Diagnosis not present

## 2018-10-11 ENCOUNTER — Other Ambulatory Visit: Payer: Self-pay

## 2018-10-11 ENCOUNTER — Ambulatory Visit (INDEPENDENT_AMBULATORY_CARE_PROVIDER_SITE_OTHER): Payer: Medicare Other

## 2018-10-11 ENCOUNTER — Ambulatory Visit (INDEPENDENT_AMBULATORY_CARE_PROVIDER_SITE_OTHER): Payer: Medicare Other | Admitting: Family Medicine

## 2018-10-11 ENCOUNTER — Encounter: Payer: Self-pay | Admitting: Family Medicine

## 2018-10-11 VITALS — BP 138/82 | HR 60 | Temp 97.6°F | Ht 73.0 in | Wt 255.0 lb

## 2018-10-11 DIAGNOSIS — M109 Gout, unspecified: Secondary | ICD-10-CM

## 2018-10-11 MED ORDER — PREDNISONE 5 MG PO TABS: ORAL_TABLET | ORAL | 0 refills | Status: DC

## 2018-10-11 NOTE — Patient Instructions (Signed)
Good to see you  Please take tylenol for pain  Please use ice if needed  Please send me a message on MyChart with any questions or updates.

## 2018-10-11 NOTE — Assessment & Plan Note (Signed)
Acute on chronic exacerbation. Having multiple joints affected.  - knee injection today  - supplied prednisone for other joints  - counseled on supportive care - may need to extend the course of prednisone.

## 2018-10-11 NOTE — Progress Notes (Signed)
[name] - 68 y.o. male MRN [id number]  Date of birth: [birth year]  SUBJECTIVE:  Including CC & ROS.  Chief Complaint  Patient presents with  . Pain    Right knee, ankle, and big toe pain (flare up)/ left knee flare up    [name] is a 68 y.o. male that is  Presenting with acute on chronic worsening right knee pain. The pain is generalized. Has received steroid injections in the past which have improved his pain. Has a history of gout. No inciting event. Pain is moderate to severe. Pain is constant and worse with movement or when touched. Also having pain in his toe and ankle. Having swelling in knee, ankle, and toe. Is taking allopurinol. No new or different medications. .    Review of Systems  Constitutional: Negative for fever.  HENT: Negative for congestion.   Respiratory: Negative for cough.   Cardiovascular: Negative for chest pain.  Gastrointestinal: Negative for abdominal pain.  Musculoskeletal: Positive for gait problem and joint swelling.  Skin: Negative for color change.  Neurological: Negative for weakness.  Hematological: Negative for adenopathy.  Psychiatric/Behavioral: Negative for agitation.    HISTORY: Past Medical, Surgical, Social, and Family History Reviewed & Updated per EMR.   Pertinent Historical Findings include:  Past Medical History:  Diagnosis Date  . Anemia    normal Fe, nl B12, nl retic, nl EPO July '13  . Blood transfusion without reported diagnosis   . BPH (benign prostatic hyperplasia)   . Chronic back pain   . Chronic kidney disease    CKD III, obstructive nephropathy  . Diverticulosis   . Dysuria   . Elevated PSA, greater than or equal to 20 ng/ml June '13   PSA 107  . Hemorrhoids, internal, with bleeding, prolapse 09/19/2014  . Hyperlipidemia 05/29/2014  . Hypertension   . Obstructive uropathy 11/27/2015  . Renal insufficiency   . Tuberculosis    h/o PPD +  . UTI (urinary tract infection)   . Vitamin D deficiency 09/13/2017     Past Surgical History:  Procedure Laterality Date  . CARPAL TUNNEL RELEASE Right   . COLONOSCOPY    . HEMORRHOID BANDING    . PROSTATE SURGERY    . SPLENECTOMY      No Known Allergies  Family History  Problem Relation Age of Onset  . Diabetes Mother   . Stroke Brother   . Hearing loss Brother   . Colon cancer Neg Hx   . Rectal cancer Neg Hx   . Stomach cancer Neg Hx   . Esophageal cancer Neg Hx      Social History   Socioeconomic History  . Marital status: Married    Spouse name: Not on file  . Number of children: 2  . Years of education: 61  . Highest education level: Not on file  Occupational History  . 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 Financial resource strain: Not on file  . Food insecurity:    Worry: Not on file    Inability: Not on file  . Transportation needs:    Medical: Not on file    Non-medical: Not on file  Tobacco Use  . Smoking status: Former Smoker    Types: Cigarettes  . Smokeless tobacco: Never Used  . Tobacco comment: Quit 1981  Substance and Sexual Activity  . Alcohol use: No    Alcohol/week: 0.0 standard drinks  Comment: Quit 2004  . Drug use: No  . Sexual activity: Never  Lifestyle  . Physical activity:    Days per week: Not on file    Minutes per session: Not on file  . Stress: Not on file  Relationships  . Social connections:    Talks on phone: Not on file    Gets together: Not on file    Attends religious service: Not on file    Active member of club or organization: Not on file    Attends meetings of clubs or organizations: Not on file    Relationship status: Not on file  . Intimate partner violence:    Fear of current or ex partner: Not on file    Emotionally abused: Not on file    Physically abused: Not on file    Forced sexual activity: Not on file  Other Topics Concern  . Not on file  Social History Narrative   Native of [place], raised poor farming  community. . To [place] '78 - finished College, all but Arts administrator. Married - wife in [place], blocked from [place] to [place]. 1 son '90; 1 dtr '93. 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 Resources depleted.   Right handed.   Caffeine Hot daily one daily.     PHYSICAL EXAM:  VS: BP 138/82   Pulse 60   Temp 97.6 F (36.4 C) (Oral)   Ht 6\' 1"  (1.854 m)   Wt 255 lb (115.7 kg)   SpO2 98%   BMI 33.64 kg/m  Physical Exam Gen: NAD, alert, cooperative with exam, well-appearing ENT: normal lips, normal nasal mucosa,  Eye: normal EOM, normal conjunctiva and lids CV:  no edema, +2 pedal pulses   Resp: no accessory muscle use, non-labored,   Skin: no rashes, no areas of induration  Neuro: normal tone, normal sensation to touch Psych:  normal insight, alert and oriented MSK:  Right knee:  Mild to moderate effusion  Normal ROM  Normal strength to resistance  Mild instability with valgus and varus testing  Negative McMurray's test  Right ankle/foot:  Swelling of the great toe.  Limited great toe ROM  Limited ankle ROM  Neurovascularly intact.  Aspiration/Injection Procedure Note [name] [birth year]  Procedure: Injection Indications: right knee pain   Procedure Details Consent: Risks of procedure as well as the alternatives and risks of each were explained to the (patient/caregiver).  Consent for procedure obtained. Time Out: Verified patient identification, verified procedure, site/side was marked, verified correct patient position, special equipment/implants available, medications/allergies/relevent history reviewed, required imaging and test results available.  Performed.  The area was cleaned with iodine and alcohol swabs.    The right knee superiorlateral suprapatellar pouch was injected using 1 cc's of 40 mg  kenalog and 4 cc's of 0.25% bupivacaine with a 22 1 1/2" needle.  Ultrasound was used. Images were obtained in long views showing the  injection.     A sterile dressing was applied.  Patient did tolerate procedure well.     ASSESSMENT & PLAN:   Acute gouty arthritis Acute on chronic exacerbation. Having multiple joints affected.  - knee injection today  - supplied prednisone for other joints  - counseled on supportive care - may need to extend the course of prednisone.

## 2018-10-18 ENCOUNTER — Other Ambulatory Visit: Payer: Self-pay | Admitting: Urology

## 2018-10-18 DIAGNOSIS — R972 Elevated prostate specific antigen [PSA]: Secondary | ICD-10-CM

## 2018-11-04 ENCOUNTER — Encounter (HOSPITAL_COMMUNITY): Payer: Self-pay | Admitting: Emergency Medicine

## 2018-11-04 ENCOUNTER — Emergency Department (HOSPITAL_COMMUNITY)
Admission: EM | Admit: 2018-11-04 | Discharge: 2018-11-04 | Disposition: A | Discharge status: Home / Self Care | Payer: Medicare Other | Attending: Emergency Medicine | Admitting: Emergency Medicine

## 2018-11-04 ENCOUNTER — Emergency Department (HOSPITAL_COMMUNITY): Payer: Medicare Other

## 2018-11-04 ENCOUNTER — Other Ambulatory Visit: Payer: Self-pay

## 2018-11-04 DIAGNOSIS — R072 Precordial pain: Secondary | ICD-10-CM | POA: Diagnosis not present

## 2018-11-04 DIAGNOSIS — N183 Chronic kidney disease, stage 3 unspecified: Secondary | ICD-10-CM

## 2018-11-04 DIAGNOSIS — Z87891 Personal history of nicotine dependence: Secondary | ICD-10-CM | POA: Insufficient documentation

## 2018-11-04 DIAGNOSIS — I10 Essential (primary) hypertension: Secondary | ICD-10-CM

## 2018-11-04 DIAGNOSIS — Z7982 Long term (current) use of aspirin: Secondary | ICD-10-CM | POA: Insufficient documentation

## 2018-11-04 DIAGNOSIS — Z79899 Other long term (current) drug therapy: Secondary | ICD-10-CM | POA: Diagnosis not present

## 2018-11-04 DIAGNOSIS — R42 Dizziness and giddiness: Secondary | ICD-10-CM | POA: Diagnosis not present

## 2018-11-04 DIAGNOSIS — I129 Hypertensive chronic kidney disease with stage 1 through stage 4 chronic kidney disease, or unspecified chronic kidney disease: Secondary | ICD-10-CM | POA: Insufficient documentation

## 2018-11-04 DIAGNOSIS — E876 Hypokalemia: Secondary | ICD-10-CM | POA: Insufficient documentation

## 2018-11-04 DIAGNOSIS — R079 Chest pain, unspecified: Secondary | ICD-10-CM | POA: Diagnosis not present

## 2018-11-04 DIAGNOSIS — R55 Syncope and collapse: Secondary | ICD-10-CM | POA: Diagnosis not present

## 2018-11-04 LAB — CBC
HCT: 43.6 % (ref 39.0–52.0)
Hemoglobin: 14 g/dL (ref 13.0–17.0)
MCH: 28.8 pg (ref 26.0–34.0)
MCHC: 32.1 g/dL (ref 30.0–36.0)
MCV: 89.7 fL (ref 80.0–100.0)
Platelets: 210 10*3/uL (ref 150–400)
RBC: 4.86 MIL/uL (ref 4.22–5.81)
RDW: 13.7 % (ref 11.5–15.5)
WBC: 6.2 10*3/uL (ref 4.0–10.5)
nRBC: 0 % (ref 0.0–0.2)

## 2018-11-04 LAB — BASIC METABOLIC PANEL
Anion gap: 12 (ref 5–15)
BUN: 26 mg/dL — ABNORMAL HIGH (ref 8–23)
CO2: 27 mmol/L (ref 22–32)
Calcium: 9 mg/dL (ref 8.9–10.3)
Chloride: 101 mmol/L (ref 98–111)
Creatinine, Ser: 2.26 mg/dL — ABNORMAL HIGH (ref 0.61–1.24)
GFR calc Af Amer: 33 mL/min — ABNORMAL LOW (ref >60)
GFR calc non Af Amer: 29 mL/min — ABNORMAL LOW (ref >60)
Glucose, Bld: 132 mg/dL — ABNORMAL HIGH (ref 70–99)
Potassium: 3.4 mmol/L — ABNORMAL LOW (ref 3.5–5.1)
Sodium: 140 mmol/L (ref 135–145)

## 2018-11-04 LAB — TROPONIN I
Troponin I: <0.03 ng/mL (ref <0.03)
Troponin I: <0.03 ng/mL (ref <0.03)

## 2018-11-04 MED ORDER — SODIUM CHLORIDE 0.9% FLUSH: 3.0000 mL | Freq: Once | INTRAVENOUS | Status: DC

## 2018-11-04 MED ORDER — ACETAMINOPHEN 325 MG PO TABS
650.0000 mg | ORAL_TABLET | Freq: Once | ORAL | Status: AC
  Administered 2018-11-04: 650 mg via ORAL
  Filled 2018-11-04: qty 2

## 2018-11-04 NOTE — Discharge Instructions (Addendum)
It was our pleasure to provide your ER care today - we hope that you feel better.  From today's labs, your heart tests x 2 were normal.   Also from the lab tests, you do have persistent chronic kidney insufficiency (creatinine 2.2) - it is very important that you continue to work with your doctor to control your blood pressure. Also, make sure to avoid taking anti-inflammatory type medication such as ibuprofen/motrin, naprosyn/aleve. Continue your blood pressure medication, limit salt intake, and follow up with your doctor this week.  Your potassium level is slightly low (3.4) - eat plenty of fruits and vegetables, follow up with your doctor.   For chest discomfort, follow up cardiologist in the coming week - call office Monday AM to arrange appointment.   Return to ER right away if worse, new symptoms, increased trouble breathing, persistent or recurrent chest pain, fevers, other concern.

## 2018-11-04 NOTE — ED Notes (Signed)
Patient verbalizes understanding of discharge instructions. Opportunity for questioning and answers were provided. Armband removed by staff, pt discharged from ED ambulatory.   

## 2018-11-04 NOTE — ED Triage Notes (Signed)
Patient in via [facility] from home c/o central, non-radiating CP onset at 11am upon waking up. Also endorses dizziness. Denies having same before. Patient took 324mg  ASA PTA and was given 1 SL NTG with some relief. Denies shortness of breath, cough, fevers/chills, N/V. Resp e/u, skin w/d.   EMS VS: 156/104, HR 60 NSR, 98% RA, 97.55F tympanic, CBG 119. 20g. PIV LAC.

## 2018-11-04 NOTE — ED Notes (Signed)
Pt tolerated PO water well.

## 2018-11-04 NOTE — ED Provider Notes (Signed)
[facility] EMERGENCY DEPARTMENT Provider Note   CSN: [id number] Arrival date & time: 11/04/18  1414    History   Chief Complaint Chief Complaint  Patient presents with  . Chest Pain    HPI [name] is a 68 y.o. male.     Patient c/o mid chest pain at rest today. States felt as if he had to go to bathroom, and then transiently felt sweaty.  Symptoms persistent, constant, dull, acute onset, non radiating. Unsure of similar symptoms in past. Pain is not pleuritic. No cough or fever. Denies sob or unusual doe. Denies leg pain or swelling. Denies any other recent cp or exertional cp.   The history is provided by the patient.  Chest Pain  Associated symptoms: no abdominal pain, no back pain, no cough, no fever, no headache and no vomiting     Past Medical History:  Diagnosis Date  . Anemia    normal Fe, nl B12, nl retic, nl EPO July '13  . Blood transfusion without reported diagnosis   . BPH (benign prostatic hyperplasia)   . Chronic back pain   . Chronic kidney disease    CKD III, obstructive nephropathy  . Diverticulosis   . Dysuria   . Elevated PSA, greater than or equal to 20 ng/ml June '13   PSA 107  . Hemorrhoids, internal, with bleeding, prolapse 09/19/2014  . Hyperlipidemia 05/29/2014  . Hypertension   . Obstructive uropathy 11/27/2015  . Renal insufficiency   . Tuberculosis    h/o PPD +  . UTI (urinary tract infection)   . Vitamin D deficiency 09/13/2017    Patient Active Problem List   Diagnosis Date Noted  . Vitamin D deficiency 09/13/2017  . Urinary tract infection without hematuria 07/23/2017  . Low back pain 07/03/2017  . Dizziness 06/30/2017  . Acute gouty arthritis 12/01/2016  . Degenerative arthritis of knee, bilateral 11/18/2016  . Urinary retention due to benign prostatic hyperplasia 10/24/2016  . Mass of right side of neck 10/20/2016  . Gout 10/20/2016  . Knee pain, acute 10/12/2016  . Hypokalemia 05/28/2016  . Encounter  for well adult exam with abnormal findings 11/27/2015  . Obstructive uropathy 11/27/2015  . Elevated PSA 11/27/2015  . Acute pyelonephritis 05/27/2015  . Generalized bloating 05/27/2015  . Constipation 05/27/2015  . Chest pain 05/27/2015  . AKI (acute kidney injury) ([place]) 05/27/2015  . Acute sinus infection 11/22/2014  . Cough 09/25/2014  . Hemorrhoids, internal, with bleeding, prolapse 09/19/2014  . 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 Hyperlipidemia 05/29/2014  . Lumbar radiculopathy 05/23/2014  . Lumbar stenosis 11/20/2013  . Abnormal glucose 11/06/2013  . Unspecified hereditary and idiopathic peripheral neuropathy 01/22/2013  . Cardiomyopathy due to hypertension ([place]) 12/28/2011  . CKD (chronic kidney disease) stage 3, GFR 30-59 ml/min (H[facility]) 11/24/2011  . Hematuria 11/24/2011  . Hydronephrosis 11/24/2011  . Anemia 11/24/2011  . BRADYCARDIA 02/05/2010  . TINEA PEDIS 01/29/2010  . 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 PERIPHERAL EDEMA 01/29/2010  . ABNORMAL ELECTROCARDIOGRAM 01/29/2010  . 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  . Osteoarthrosis, generalized, 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  . ONYCHOMYCOSIS, TOENAILS 10/02/2007  . BPH (benign prostatic hyperplasia) 10/02/2007  . Essential hypertension 03/06/2007    Past Surgical History:  Procedure Laterality Date  . CARPAL TUNNEL RELEASE Right   . COLONOSCOPY    . HEMORRHOID BANDING    . PROSTATE SURGERY    . SPLENECTOMY          Home Medications  Prior to Admission medications   Medication Sig Start Date End Date Taking? Authorizing Provider  acetaminophen (TYLENOL) 500 MG tablet Take 1,000 mg by mouth every 6 (six) hours as needed (pain).   Yes [provider]  allopurinol (ZYLOPRIM) 100 MG tablet Take 2 tablets (200 mg total) by mouth daily. Patient taking differently: Take 100 mg by mouth daily.  05/12/18  Yes Biagio Borg, MD  aspirin EC 81 MG tablet Take 81 mg by mouth daily.   Yes [provider]   atorvastatin (LIPITOR) 40 MG tablet TAKE 1 TABLET BY MOUTH ONCE DAILY Patient taking differently: Take 40 mg by mouth at bedtime.  01/10/18  Yes Biagio Borg, MD  cholecalciferol (VITAMIN D3) 25 MCG (1000 UT) tablet Take 1,000 Units by mouth daily.   Yes [provider]  colchicine 0.6 MG tablet Take 0.6 mg by mouth daily. 10/21/18  Yes [provider]  finasteride (PROSCAR) 5 MG tablet TAKE 1 TABLET BY MOUTH ONCE DAILY Patient taking differently: Take 5 mg by mouth daily.  06/12/18  Yes Biagio Borg, MD  FOLIC ACID PO Take 1 tablet by mouth daily.   Yes [provider]  lidocaine (XYLOCAINE) 2 % jelly Place 1 application into the urethra as needed. Patient taking differently: Place 1 application into the urethra daily as needed (pain from disposable catheter).  04/21/18  Yes Recardo Evangelist, PA-C  metoprolol tartrate (LOPRESSOR) 25 MG tablet Take 1 tablet by mouth twice daily Patient taking differently: Take 25 mg by mouth 2 (two) times daily. (BETA BLOCKER) 08/23/18  Yes Biagio Borg, MD  Misc Natural Products (TART CHERRY ADVANCED PO) Take 1 tablet by mouth daily.   Yes [provider]  POTASSIUM PO Take 1 tablet by mouth daily.   Yes [provider]  tamsulosin (FLOMAX) 0.4 MG CAPS capsule Take 0.4 mg by mouth daily. 10/17/18  Yes [provider]  acetaminophen (TYLENOL) 325 MG tablet Take 2 tablets (650 mg total) by mouth every 6 (six) hours as needed. Patient not taking: Reported on 11/04/2018 05/23/15   Varney Biles, MD  cyclobenzaprine (FLEXERIL) 10 MG tablet Take 1 tablet (10 mg total) by mouth 2 (two) times daily as needed for muscle spasms. Patient not taking: Reported on 11/04/2018 04/21/18   Recardo Evangelist, PA-C    Family History Family History  Problem Relation Age of Onset  . Diabetes Mother   . Stroke Brother   . Hearing loss Brother   . Colon cancer Neg Hx   . Rectal cancer Neg Hx   . Stomach cancer Neg Hx   .  Esophageal cancer Neg Hx     Social History Social History   Tobacco Use  . Smoking status: Former Smoker    Types: Cigarettes  . Smokeless tobacco: Never Used  . Tobacco comment: Quit 1981  Substance Use Topics  . Alcohol use: No    Alcohol/week: 0.0 standard drinks    Comment: Quit 2004  . Drug use: No     Allergies   Patient has no known allergies.   Review of Systems Review of Systems  Constitutional: Negative for fever.  HENT: Negative for sore throat.   Eyes: Negative for redness.  Respiratory: Negative for cough.   Cardiovascular: Positive for chest pain. Negative for leg swelling.  Gastrointestinal: Negative for abdominal pain and vomiting.  Genitourinary: Negative for flank pain.  Musculoskeletal: Negative for back pain and neck pain.  Skin: Negative for rash.  Neurological: Negative for headaches.  Hematological: Does not bruise/bleed easily.  Psychiatric/Behavioral: Negative for confusion.     Physical Exam Updated Vital Signs BP (!) 173/95   Pulse (!) 47   Temp 97.9 F (36.6 C) (Oral)   Resp 13   Ht 1.854 m (6\' 1" )   Wt 113.4 kg   SpO2 99%   BMI 32.98 kg/m   Physical Exam Vitals signs and nursing note reviewed.  Constitutional:      Appearance: Normal appearance. He is well-developed.  HENT:     Head: Atraumatic.     Nose: Nose normal.     Mouth/Throat:     Mouth: Mucous membranes are moist.     Pharynx: Oropharynx is clear.  Eyes:     General: No scleral icterus.    Conjunctiva/sclera: Conjunctivae normal.     Pupils: Pupils are equal, round, and reactive to light.  Neck:     Musculoskeletal: Normal range of motion and neck supple. No neck rigidity.     Trachea: No tracheal deviation.  Cardiovascular:     Rate and Rhythm: Normal rate and regular rhythm.     Pulses: Normal pulses.     Heart sounds: Normal heart sounds. No murmur. No friction rub. No gallop.   Pulmonary:     Effort: Pulmonary effort is normal. No accessory muscle  usage or respiratory distress.     Breath sounds: Normal breath sounds.  Chest:     Chest wall: No tenderness.  Abdominal:     General: Bowel sounds are normal. There is no distension.     Palpations: Abdomen is soft.     Tenderness: There is no abdominal tenderness. There is no guarding.  Genitourinary:    Comments: No cva tenderness. Musculoskeletal:        General: No swelling.  Skin:    General: Skin is warm and dry.     Findings: No rash.  Neurological:     Mental Status: He is alert.     Comments: Alert, speech clear.   Psychiatric:        Mood and Affect: Mood normal.      ED Treatments / Results  Labs (all labs ordered are listed, but only abnormal results are displayed) Results for orders placed or performed during the hospital encounter of 99/24/26  Basic metabolic panel  Result Value Ref Range   Sodium 140 135 - 145 mmol/L   Potassium 3.4 (L) 3.5 - 5.1 mmol/L   Chloride 101 98 - 111 mmol/L   CO2 27 22 - 32 mmol/L   Glucose, Bld 132 (H) 70 - 99 mg/dL   BUN 26 (H) 8 - 23 mg/dL   Creatinine, Ser 2.26 (H) 0.61 - 1.24 mg/dL   Calcium 9.0 8.9 - 10.3 mg/dL   GFR calc non Af Amer 29 (L) >60 mL/min   GFR calc Af Amer 33 (L) >60 mL/min   Anion gap 12 5 - 15  CBC  Result Value Ref Range   WBC 6.2 4.0 - 10.5 K/uL   RBC 4.86 4.22 - 5.81 MIL/uL   Hemoglobin 14.0 13.0 - 17.0 g/dL   HCT 43.6 39.0 - 52.0 %   MCV 89.7 80.0 - 100.0 fL   MCH 28.8 26.0 - 34.0 pg   MCHC 32.1 30.0 - 36.0 g/dL   RDW 13.7 11.5 - 15.5 %   Platelets 210 150 - 400 K/uL   nRBC 0.0 0.0 - 0.2 %  Troponin I - ONCE - STAT  Result Value Ref Range   Troponin I <0.03 <0.03 ng/mL  Troponin I - ONCE - STAT  Result Value Ref Range   Troponin I <0.03 <0.03 ng/mL   Dg Chest 2 View  Result Date: 11/04/2018 CLINICAL DATA:  Chest pain EXAM: CHEST - 2 VIEW COMPARISON:  06/07/2017 FINDINGS: Stable cardiomegaly. Minor basilar atelectasis. No focal pneumonia, collapse or consolidation. Negative for edema,  effusion or pneumothorax. Trachea midline. Aorta is ectatic and tortuous as before. Postop changes in the upper abdomen. IMPRESSION: Stable chest exam. No interval change or superimposed acute process. Electronically Signed   By: Jerilynn Mages.  Shick M.D.   On: 11/04/2018 14:58   [place] Rt Lower Extrem Ltd Soft Tissue Non Vascular  Result Date: 10/24/2018 Aspiration/Injection Procedure Note Samel Bruna Sep 05, 1950  Procedure: Injection Indications: right knee pain  Procedure Details Consent: Risks of procedure as well as the alternatives and risks of each were explained to the (patient/caregiver).  Consent for procedure obtained. Time Out: Verified patient identification, verified procedure, site/side was marked, verified correct patient position, special equipment/implants available, medications/allergies/relevent history reviewed, required imaging and test results available.  Performed.  The area was cleaned with iodine and alcohol swabs.   The right knee superiorlateral suprapatellar pouch was injected using 1 cc's of 40 mg  kenalog and 4 cc's of 0.25% bupivacaine with a 22 1 1/2" needle.  Ultrasound was used. Images were obtained in long views showing the injection.    A sterile dressing was applied.  Patient did tolerate procedure well.   EKG EKG Interpretation  Date/Time:  Saturday Nov 04 2018 14:11:10 EDT Ventricular Rate:  63 PR Interval:  204 QRS Duration: 150 QT Interval:  474 QTC Calculation: 485 R Axis:   -46 Text Interpretation:  Normal sinus rhythm Right bundle branch block Left anterior fascicular block Bifascicular block Non-specific ST-t changes Confirmed by Lajean Saver [phone]) on 11/04/2018 2:55:28 PM   Radiology Dg Chest 2 View  Result Date: 11/04/2018 CLINICAL DATA:  Chest pain EXAM: CHEST - 2 VIEW COMPARISON:  06/07/2017 FINDINGS: Stable cardiomegaly. Minor basilar atelectasis. No focal pneumonia, collapse or consolidation. Negative for edema, effusion or pneumothorax. Trachea  midline. Aorta is ectatic and tortuous as before. Postop changes in the upper abdomen. IMPRESSION: Stable chest exam. No interval change or superimposed acute process. Electronically Signed   By: Jerilynn Mages.  Shick M.D.   On: 11/04/2018 14:58    Procedures Procedures (including critical care time)  Medications Ordered in ED Medications  sodium chloride flush (NS) 0.9 % injection 3 mL (3 mLs Intravenous Not Given 11/04/18 1456)  acetaminophen (TYLENOL) tablet 650 mg (650 mg Oral Given 11/04/18 1539)     Initial Impression / Assessment and Plan / ED Course  I have reviewed the triage vital signs and the nursing notes.  Pertinent labs & imaging results that were available during my care of the patient were reviewed by me and considered in my medical decision making (see chart for details).  Iv. Ecg. Monitor. Labs. Cxr.   Reviewed nursing notes and prior charts for additional history.   Labs reviewed by me initial trop normal. K sl low - kcl po.  Acetaminophen po, po fluids.   Recheck pt, no chest pain. No sob.   Await delta trop.  cxr reviewed by me - no pna.  Delta trop is normal.   Recheck pt - remains symptom free, and stable for d/c.  rec close outpt cardiology f/u.  Return precautions discussed/provided.     Final Clinical  Impressions(s) / ED Diagnoses   Final diagnoses:  None    ED Discharge Orders    None       Lajean Saver, MD 11/04/18 1924

## 2018-11-06 ENCOUNTER — Telehealth: Payer: Self-pay | Admitting: *Deleted

## 2018-11-06 DIAGNOSIS — R079 Chest pain, unspecified: Secondary | ICD-10-CM

## 2018-11-06 NOTE — Telephone Encounter (Signed)
Copied from [place] [phone]. Topic: Referral - Request for Referral >> Nov 06, 2018  1:46 PM Alanda Slim E wrote: Has patient seen PCP for this complaint? Yes  *If NO, is insurance requiring patient see PCP for this issue before PCP can refer them? Referral for which specialty: cardiologist  Preferred provider/office: [phone]/ [facility]  Reason for referral: chest pain / Pt was told to see a cardiologist for a follow up

## 2018-11-06 NOTE — Telephone Encounter (Signed)
Ok this is done 

## 2018-11-10 DIAGNOSIS — R339 Retention of urine, unspecified: Secondary | ICD-10-CM | POA: Diagnosis not present

## 2018-11-11 ENCOUNTER — Ambulatory Visit
Admission: RE | Admit: 2018-11-11 | Discharge: 2018-11-11 | Disposition: A | Discharge status: Home / Self Care | Payer: Medicare Other | Source: Ambulatory Visit | Attending: Urology | Admitting: Urology

## 2018-11-11 ENCOUNTER — Other Ambulatory Visit: Payer: Self-pay

## 2018-11-11 DIAGNOSIS — N3289 Other specified disorders of bladder: Secondary | ICD-10-CM | POA: Diagnosis not present

## 2018-11-11 DIAGNOSIS — R972 Elevated prostate specific antigen [PSA]: Secondary | ICD-10-CM

## 2018-11-11 MED ORDER — GADOBENATE DIMEGLUMINE 529 MG/ML IV SOLN
10.0000 mL | Freq: Once | INTRAVENOUS | Status: AC | PRN
  Administered 2018-11-11: 10:00:00 10 mL via INTRAVENOUS

## 2018-11-22 ENCOUNTER — Ambulatory Visit: Payer: Self-pay | Admitting: *Deleted

## 2018-11-22 DIAGNOSIS — R310 Gross hematuria: Secondary | ICD-10-CM

## 2018-11-22 NOTE — Addendum Note (Signed)
Addended by: Biagio Borg on: 11/22/2018 07:20 PM   Modules accepted: Orders

## 2018-11-22 NOTE — Telephone Encounter (Addendum)
Ok for urine studies in am as ordered if no pain or fever  I will also refer to urology  Pt should go to ED for worsening pain, fever or bleeding

## 2018-11-22 NOTE — Telephone Encounter (Signed)
Pt calling with complaints of dark colored bloody urine that has happened twice since 3 am this morning. Pt denies any pain, dizziness or other symptoms at this time. Pt advised that if symptoms become worse during the night to seek treatment in the Urgent Care/ED. Pt verbalized understanding. Pt can be contacted at [phone] to be scheduled for appt.   Reason for Disposition . Blood in urine  (Exception: could be normal menstrual bleeding)  Answer Assessment - Initial Assessment Questions 1. COLOR of URINE: "Describe the color of the urine."  (e.g., tea-colored, pink, red, blood clots, bloody)     Dark in color 2. ONSET: "When did the bleeding start?"      This morning around 3 am 3. EPISODES: "How many times has there been blood in the urine?" or "How many times today?"     2 times today 4. PAIN with URINATION: "Is there any pain with passing your urine?" If so, ask: "How bad is the pain?"  (Scale 1-10; or mild, moderate, severe)    - MILD - complains slightly about urination hurting    - MODERATE - interferes with normal activities      - SEVERE - excruciating, unwilling or unable to urinate because of the pain      No pain 5. FEVER: "Do you have a fever?" If so, ask: "What is your temperature, how was it measured, and when did it start?"     No 6. ASSOCIATED SYMPTOMS: "Are you passing urine more frequently than usual?"     No 7. OTHER SYMPTOMS: "Do you have any other symptoms?" (e.g., back/flank pain, abdominal pain, vomiting)     no 8. PREGNANCY: "Is there any chance you are pregnant?" "When was your last menstrual period?"     n/a  Protocols used: URINE - BLOOD IN-A-AH

## 2018-11-23 ENCOUNTER — Other Ambulatory Visit: Payer: Medicare Other

## 2018-11-23 NOTE — Telephone Encounter (Signed)
Pt has been informed and expressed understanding.  

## 2018-11-29 ENCOUNTER — Encounter (HOSPITAL_COMMUNITY): Payer: Self-pay | Admitting: Cardiovascular Disease

## 2018-11-29 ENCOUNTER — Telehealth: Payer: Self-pay

## 2018-11-29 ENCOUNTER — Other Ambulatory Visit: Payer: Self-pay

## 2018-11-29 ENCOUNTER — Telehealth (INDEPENDENT_AMBULATORY_CARE_PROVIDER_SITE_OTHER): Payer: Medicare Other | Admitting: Cardiovascular Disease

## 2018-11-29 ENCOUNTER — Encounter: Payer: Self-pay | Admitting: Cardiovascular Disease

## 2018-11-29 VITALS — Ht 73.0 in | Wt 250.0 lb

## 2018-11-29 DIAGNOSIS — R079 Chest pain, unspecified: Secondary | ICD-10-CM

## 2018-11-29 DIAGNOSIS — Z7189 Other specified counseling: Secondary | ICD-10-CM

## 2018-11-29 NOTE — Telephone Encounter (Signed)
Virtual Visit Pre-Appointment Phone Call  "(Name), I am calling you today to discuss your upcoming appointment. We are currently trying to limit exposure to the virus that causes COVID-19 by seeing patients at home rather than in the office."  1. "What is the BEST phone number to call the day of the visit?" - include this in appointment notes  2. "Do you have or have access to (through a family member/friend) a smartphone with video capability that we can use for your visit?" a. If yes - list this number in appt notes as "cell" (if different from BEST phone #) and list the appointment type as a VIDEO visit in appointment notes b. If no - list the appointment type as a PHONE visit in appointment notes  3. Confirm consent - "In the setting of the current Covid19 crisis, you are scheduled for a (phone or video) visit with your provider on (date) at (time).  Just as we do with many in-office visits, in order for you to participate in this visit, we must obtain consent.  If you'd like, I can send this to your mychart (if signed up) or email for you to review.  Otherwise, I can obtain your verbal consent now.  All virtual visits are billed to your insurance company just like a normal visit would be.  By agreeing to a virtual visit, we'd like you to understand that the technology does not allow for your provider to perform an examination, and thus may limit your provider's ability to fully assess your condition. If your provider identifies any concerns that need to be evaluated in person, we will make arrangements to do so.  Finally, though the technology is pretty good, we cannot assure that it will always work on either your or our end, and in the setting of a video visit, we may have to convert it to a phone-only visit.  In either situation, we cannot ensure that we have a secure connection.  Are you willing to proceed?" STAFF: Did the patient verbally acknowledge consent to telehealth visit? Document  YES/NO here: YES  4. Advise patient to be prepared - "Two hours prior to your appointment, go ahead and check your blood pressure, pulse, oxygen saturation, and your weight (if you have the equipment to check those) and write them all down. When your visit starts, your provider will ask you for this information. If you have an Apple Watch or Kardia device, please plan to have heart rate information ready on the day of your appointment. Please have a pen and paper handy nearby the day of the visit as well."  5. Give patient instructions for MyChart download to smartphone OR Doximity/[email or web address] as below if video visit (depending on what platform provider is using)  6. Inform patient they will receive a phone call 15 minutes prior to their appointment time (may be from unknown caller ID) so they should be prepared to answer    TELEPHONE CALL NOTE  [name] has been deemed a candidate for a follow-up tele-health visit to limit community exposure during the Covid-19 pandemic. I spoke with the patient via phone to ensure availability of phone/video source, confirm preferred email & phone number, and discuss instructions and expectations.  I reminded [name] to be prepared with any vital sign and/or heart rhythm information that could potentially be obtained via home monitoring, at the time of his visit. I reminded [name] to expect a phone call prior to his visit.  [name], [place] 11/29/2018 10:31 AM   INSTRUCTIONS FOR DOWNLOADING THE MYCHART APP TO SMARTPHONE  - The patient must first make sure to have activated MyChart and know their login information - If Apple, go to [employer] and type in MyChart in the search bar and download the app. If Android, ask patient to go to [employer] and type in [place] in the search bar and download the app. The app is free but as with any other app downloads, their phone may require them to verify saved payment information or Apple/Android  password.  - The patient will need to then log into the app with their MyChart username and password, and select [facility] as their healthcare provider to link the account. When it is time for your visit, go to the MyChart app, find appointments, and click Begin Video Visit. Be sure to Select Allow for your device to access the Microphone and Camera for your visit. You will then be connected, and your provider will be with you shortly.  **If they have any issues connecting, or need assistance please contact MyChart service desk [phone])**  **If using a computer, in order to ensure the best quality for their visit they will need to use either of the following Internet Browsers: [employer], or Google Chrome**  IF USING DOXIMITY or DOXY.ME - The patient will receive a link just prior to their visit by text.     FULL LENGTH CONSENT FOR TELE-HEALTH VISIT   I hereby voluntarily request, consent and authorize [facility] and its employed or contracted physicians, physician assistants, nurse practitioners or other licensed health care professionals (the Practitioner), to provide me with telemedicine health care services (the "Services") as deemed necessary by the treating Practitioner. I acknowledge and consent to receive the Services by the Practitioner via telemedicine. I understand that the telemedicine visit will involve communicating with the Practitioner through live audiovisual communication technology and the disclosure of certain medical information by electronic transmission. I acknowledge that I have been given the opportunity to request an in-person assessment or other available alternative prior to the telemedicine visit and am voluntarily participating in the telemedicine visit.  I understand that I have the right to withhold or withdraw my consent to the use of telemedicine in the course of my care at any time, without affecting my right to future care or treatment,  and that the Practitioner or I may terminate the telemedicine visit at any time. I understand that I have the right to inspect all information obtained and/or recorded in the course of the telemedicine visit and may receive copies of available information for a reasonable fee.  I understand that some of the potential risks of receiving the Services via telemedicine include:  Marland Kitchen Delay or interruption in medical evaluation due to technological equipment failure or disruption; . Information transmitted may not be sufficient (e.g. poor resolution of images) to allow for appropriate medical decision making by the Practitioner; and/or  . In rare instances, security protocols could fail, causing a breach of personal health information.  Furthermore, I acknowledge that it is my responsibility to provide information about my medical history, conditions and care that is complete and accurate to the best of my ability. I acknowledge that Practitioner's advice, recommendations, and/or decision may be based on factors not within their control, such as incomplete or inaccurate data provided by me or distortions of diagnostic images or specimens that may result from electronic transmissions. I understand that the  practice of medicine is not an Chief Strategy Officer and that Practitioner makes no warranties or guarantees regarding treatment outcomes. I acknowledge that I will receive a copy of this consent concurrently upon execution via email to the email address I last provided but may also request a printed copy by calling the office of [facility].    I understand that my insurance will be billed for this visit.   I have read or had this consent read to me. . I understand the contents of this consent, which adequately explains the benefits and risks of the Services being provided via telemedicine.  . I have been provided ample opportunity to ask questions regarding this consent and the Services and have had my questions  answered to my satisfaction. . I give my informed consent for the services to be provided through the use of telemedicine in my medical care  By participating in this telemedicine visit I agree to the above.

## 2018-11-29 NOTE — Progress Notes (Signed)
Virtual Visit via Telephone Note   This visit type was conducted due to national recommendations for restrictions regarding the COVID-19 Pandemic (e.g. social distancing) in an effort to limit this patient's exposure and mitigate transmission in our community.  Due to his co-morbid illnesses, this patient is at least at moderate risk for complications without adequate follow up.  This format is felt to be most appropriate for this patient at this time.  The patient did not have access to video technology/had technical difficulties with video requiring transitioning to audio format only (telephone).  All issues noted in this document were discussed and addressed.  No physical exam could be performed with this format.  Please refer to the patient's chart for his  consent to telehealth for [facility].   Date:  11/29/2018   ID:  [name], DOB Jan 24, 1951, MRN [id number]  Patient Location: Home Provider Location: Home  PCP:  Biagio Borg, MD  Cardiologist:  New, Kelson Queenan  Electrophysiologist:  None   Evaluation Performed:  Consultation - [name] was referred by  Dr. Cathlean Cower  for the evaluation of chest pain .  Chief Complaint:  Chest pain   November 29, 2018    [name] is a 68 y.o. male who was seen in the ED on May 30 with mild SSCP  Originally from [place]   Hx of chronic diastolic CHF Hx of CKD . DM Hyperlipidemia  The patient was seen in the emergency department on May 30 for chest pain.  The pain was associated with some diaphoresis.  Was described as a constant, dull chest pain.  It was nonradiating.  There was no fever or cough.  The pain was not pleuritic.  It was not associated with exercise.  The EKG was unremarkable.  His troponin levels were negative x2.  Has not had any further episodes of CP Walks 2 miles a day .  No CP .  Occasional episodes of dyspnea.   This shortness of breath has gradually worsened over the past couple of months .  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     The patient does not have symptoms concerning for COVID-19 infection (fever, chills, cough, or new shortness of breath).    Past Medical History:  Diagnosis Date  . Anemia    normal Fe, nl B12, nl retic, nl EPO July '13  . Blood transfusion without reported diagnosis   . BPH (benign prostatic hyperplasia)   . Chronic back pain   . Chronic kidney disease    CKD III, obstructive nephropathy  . Diverticulosis   . Dysuria   . Elevated PSA, greater than or equal to 20 ng/ml June '13   PSA 107  . Hemorrhoids, internal, with bleeding, prolapse 09/19/2014  . Hyperlipidemia 05/29/2014  . Hypertension   . Obstructive uropathy 11/27/2015  . Renal insufficiency   . Tuberculosis    h/o PPD +  . UTI (urinary tract infection)   . Vitamin D deficiency 09/13/2017   Past Surgical History:  Procedure Laterality Date  . CARPAL TUNNEL RELEASE Right   . COLONOSCOPY    . HEMORRHOID BANDING    . PROSTATE SURGERY    . SPLENECTOMY       Current Meds  Medication Sig  . acetaminophen (TYLENOL) 500 MG tablet Take 1,000 mg by mouth every 6 (six) hours as needed (pain).  Marland Kitchen allopurinol (ZYLOPRIM) 100 MG tablet Take 2 tablets (200 mg total) by mouth daily. (Patient  taking differently: Take 100 mg by mouth daily. )  . aspirin EC 81 MG tablet Take 81 mg by mouth daily.  Marland Kitchen atorvastatin (LIPITOR) 40 MG tablet TAKE 1 TABLET BY MOUTH ONCE DAILY (Patient taking differently: Take 40 mg by mouth at bedtime. )  . cholecalciferol (VITAMIN D3) 25 MCG (1000 UT) tablet Take 1,000 Units by mouth daily.  . colchicine 0.6 MG tablet Take 0.6 mg by mouth daily.  . finasteride (PROSCAR) 5 MG tablet TAKE 1 TABLET BY MOUTH ONCE DAILY (Patient taking differently: Take 5 mg by mouth daily. )  . FOLIC ACID PO Take 1 tablet by mouth daily.  Marland Kitchen lidocaine (XYLOCAINE) 2 % jelly Place 1 application into the urethra as needed. (Patient taking differently: Place 1 application into the urethra daily as  needed (pain from disposable catheter). )  . metoprolol tartrate (LOPRESSOR) 25 MG tablet Take 1 tablet by mouth twice daily (Patient taking differently: Take 25 mg by mouth 2 (two) times daily. (BETA BLOCKER))  . Misc Natural Products (TART CHERRY ADVANCED PO) Take 1 tablet by mouth daily.  Marland Kitchen POTASSIUM PO Take 1 tablet by mouth daily.  . tamsulosin (FLOMAX) 0.4 MG CAPS capsule Take 0.4 mg by mouth daily.     Allergies:   Patient has no known allergies.   Social History   Tobacco Use  . Smoking status: Former Smoker    Types: Cigarettes  . Smokeless tobacco: Never Used  . Tobacco comment: Quit 1981  Substance Use Topics  . Alcohol use: No    Alcohol/week: 0.0 standard drinks    Comment: Quit 2004  . Drug use: No     Family Hx: The patient's family history includes Diabetes in his mother; Hearing loss in his brother; Stroke in his brother. There is no history of Colon cancer, Rectal cancer, Stomach cancer, or Esophageal cancer.  ROS:   Please see the history of present illness.     All other systems reviewed and are negative.   Prior CV studies:   The following studies were reviewed today:    Labs/Other Tests and Data Reviewed:    EKG:  An ECG dated Nov 04, 2018 was personally reviewed today and demonstrated:  NSR, RBBB, LAFB,  HR is 63.  Recent Labs: 11/04/2018: BUN 26; Creatinine, Ser 2.26; Hemoglobin 14.0; Platelets 210; Potassium 3.4; Sodium 140   Recent Lipid Panel Lab Results  Component Value Date/Time   CHOL 134 06/30/2017 10:49 AM   TRIG 67.0 06/30/2017 10:49 AM   HDL 52.90 06/30/2017 10:49 AM   CHOLHDL 3 06/30/2017 10:49 AM   LDLCALC 68 06/30/2017 10:49 AM    Wt Readings from Last 3 Encounters:  11/29/18 250 lb (113.4 kg)  11/04/18 250 lb (113.4 kg)  10/11/18 255 lb (115.7 kg)     Objective:    Vital Signs:  Ht 6\' 1"  (1.854 m)   Wt 250 lb (113.4 kg)   BMI 32.98 kg/m    No exam available, - telephone visit  ASSESSMENT & PLAN:    1. Chest  pain: The patient presents with episodes of chest pain and pressure.  These episodes lasted for several hours.  He went to the emergency room and his work-up was unremarkable.  He does have a history of diabetes mellitus.  I think that we should evaluate him further with a Lexiscan Myoview study.  2.  Chronic diastolic congestive heart failure: Patient has a history of chronic diastolic congestive heart failure.  He has normal left  ventricular systolic function by echo.  3.  Chronic kidney disease: This is likely due to his diabetes mellitus  COVID-19 Education: The signs and symptoms of COVID-19 were discussed with the patient and how to seek care for testing (follow up with PCP or arrange E-visit).  The importance of social distancing was discussed today.  Time:   Today, I have spent  31  minutes with the patient with telehealth technology discussing the above problems.     Medication Adjustments/Labs and Tests Ordered: Current medicines are reviewed at length with the patient today.  Concerns regarding medicines are outlined above.   Tests Ordered: Orders Placed This Encounter  Procedures  . MYOCARDIAL PERFUSION IMAGING    Medication Changes: No orders of the defined types were placed in this encounter.   Follow Up:  In Person in 3 month(s)  Signed, Mertie Moores, MD  11/29/2018 12:25 PM    [facility]

## 2018-11-29 NOTE — Patient Instructions (Signed)
Medication Instructions:   If you need a refill on your cardiac medications before your next appointment, please call your pharmacy.   Lab work:  If you have labs (blood work) drawn today and your tests are completely normal, you will receive your results only by: Marland Kitchen MyChart Message (if you have MyChart) OR . A paper copy in the mail If you have any lab test that is abnormal or we need to change your treatment, we will call you to review the results.  Testing/Procedures: Your physician has requested that you have a lexiscan myoview. For further information please visit [email or web address]. Please follow instruction sheet, as given.  Follow-Up: At [facility], you and your health needs are our priority.  As part of our continuing mission to provide you with exceptional heart care, we have created designated Provider Care Teams.  These Care Teams include your primary Cardiologist (physician) and Advanced Practice Providers (APPs -  Physician Assistants and Nurse Practitioners) who all work together to provide you with the care you need, when you need it. You will need a follow up appointment in:  3 months. You may see Dr. Acie Fredrickson or one of the following Advanced Practice Providers on your designated Care Team: Richardson Dopp, PA-C [place], [place] . Daune Perch, NP

## 2018-12-05 ENCOUNTER — Telehealth (HOSPITAL_COMMUNITY): Payer: Self-pay | Admitting: *Deleted

## 2018-12-05 NOTE — Telephone Encounter (Signed)
Patient given detailed instructions per Myocardial Perfusion Study Information Sheet for the test on 12/07/18 at 7:45. Patient notified to arrive 15 minutes early and that it is imperative to arrive on time for appointment to keep from having the test rescheduled.  If you need to cancel or reschedule your appointment, please call the office within 24 hours of your appointment. . Patient verbalized understanding.[name]

## 2018-12-07 ENCOUNTER — Ambulatory Visit (HOSPITAL_COMMUNITY): Payer: Medicare Other

## 2018-12-07 ENCOUNTER — Encounter (HOSPITAL_COMMUNITY): Payer: Self-pay | Admitting: *Deleted

## 2018-12-07 ENCOUNTER — Other Ambulatory Visit: Payer: Self-pay

## 2018-12-11 ENCOUNTER — Telehealth (HOSPITAL_COMMUNITY): Payer: Self-pay

## 2018-12-11 NOTE — Telephone Encounter (Signed)
Spoke with the patient, instructions given. He stated that he understood and would be here for his test. S.Zyree Traynham EMTP

## 2018-12-14 ENCOUNTER — Ambulatory Visit (HOSPITAL_COMMUNITY): Payer: Medicare Other | Attending: Cardiology

## 2018-12-14 ENCOUNTER — Encounter (HOSPITAL_COMMUNITY): Payer: Self-pay

## 2018-12-14 ENCOUNTER — Other Ambulatory Visit: Payer: Self-pay

## 2018-12-14 DIAGNOSIS — R339 Retention of urine, unspecified: Secondary | ICD-10-CM | POA: Diagnosis not present

## 2018-12-14 DIAGNOSIS — R9431 Abnormal electrocardiogram [ECG] [EKG]: Secondary | ICD-10-CM | POA: Diagnosis not present

## 2018-12-14 DIAGNOSIS — R079 Chest pain, unspecified: Secondary | ICD-10-CM | POA: Insufficient documentation

## 2018-12-14 DIAGNOSIS — R338 Other retention of urine: Secondary | ICD-10-CM | POA: Diagnosis not present

## 2018-12-14 LAB — MYOCARDIAL PERFUSION IMAGING
LV dias vol: 183 mL (ref 62–150)
LV sys vol: 114 mL
Peak HR: 76 {beats}/min
Rest HR: 45 {beats}/min
SDS: 1
SRS: 2
SSS: 3
TID: 1

## 2018-12-14 MED ORDER — REGADENOSON 0.4 MG/5ML IV SOLN
0.4000 mg | Freq: Once | INTRAVENOUS | Status: AC
  Administered 2018-12-14: 0.4 mg via INTRAVENOUS

## 2018-12-14 MED ORDER — TECHNETIUM TC 99M TETROFOSMIN IV KIT
32.7000 | PACK | Freq: Once | INTRAVENOUS | Status: AC | PRN
  Administered 2018-12-14: 32.7 via INTRAVENOUS
  Filled 2018-12-14: qty 33

## 2018-12-14 MED ORDER — TECHNETIUM TC 99M TETROFOSMIN IV KIT
10.9000 | PACK | Freq: Once | INTRAVENOUS | Status: AC | PRN
  Administered 2018-12-14: 10.9 via INTRAVENOUS
  Filled 2018-12-14: qty 11

## 2019-01-04 ENCOUNTER — Other Ambulatory Visit: Payer: Self-pay

## 2019-01-04 ENCOUNTER — Encounter (HOSPITAL_COMMUNITY): Payer: Self-pay

## 2019-01-04 ENCOUNTER — Emergency Department (HOSPITAL_COMMUNITY)
Admission: EM | Admit: 2019-01-04 | Discharge: 2019-01-04 | Disposition: A | Discharge status: Home / Self Care | Payer: Medicare Other | Attending: Emergency Medicine | Admitting: Emergency Medicine

## 2019-01-04 DIAGNOSIS — Z7982 Long term (current) use of aspirin: Secondary | ICD-10-CM | POA: Insufficient documentation

## 2019-01-04 DIAGNOSIS — Z87891 Personal history of nicotine dependence: Secondary | ICD-10-CM | POA: Insufficient documentation

## 2019-01-04 DIAGNOSIS — I129 Hypertensive chronic kidney disease with stage 1 through stage 4 chronic kidney disease, or unspecified chronic kidney disease: Secondary | ICD-10-CM | POA: Diagnosis not present

## 2019-01-04 DIAGNOSIS — R310 Gross hematuria: Secondary | ICD-10-CM | POA: Diagnosis not present

## 2019-01-04 DIAGNOSIS — R339 Retention of urine, unspecified: Secondary | ICD-10-CM | POA: Insufficient documentation

## 2019-01-04 DIAGNOSIS — Z79899 Other long term (current) drug therapy: Secondary | ICD-10-CM | POA: Diagnosis not present

## 2019-01-04 DIAGNOSIS — Z466 Encounter for fitting and adjustment of urinary device: Secondary | ICD-10-CM | POA: Diagnosis not present

## 2019-01-04 DIAGNOSIS — N183 Chronic kidney disease, stage 3 (moderate): Secondary | ICD-10-CM | POA: Insufficient documentation

## 2019-01-04 DIAGNOSIS — R338 Other retention of urine: Secondary | ICD-10-CM | POA: Diagnosis not present

## 2019-01-04 DIAGNOSIS — R319 Hematuria, unspecified: Secondary | ICD-10-CM | POA: Diagnosis present

## 2019-01-04 LAB — BASIC METABOLIC PANEL
Anion gap: 9 (ref 5–15)
BUN: 30 mg/dL — ABNORMAL HIGH (ref 8–23)
CO2: 28 mmol/L (ref 22–32)
Calcium: 8.8 mg/dL — ABNORMAL LOW (ref 8.9–10.3)
Chloride: 103 mmol/L (ref 98–111)
Creatinine, Ser: 2.42 mg/dL — ABNORMAL HIGH (ref 0.61–1.24)
GFR calc Af Amer: 31 mL/min — ABNORMAL LOW (ref >60)
GFR calc non Af Amer: 26 mL/min — ABNORMAL LOW (ref >60)
Glucose, Bld: 89 mg/dL (ref 70–99)
Potassium: 3.6 mmol/L (ref 3.5–5.1)
Sodium: 140 mmol/L (ref 135–145)

## 2019-01-04 LAB — CBC WITH DIFFERENTIAL/PLATELET
Abs Immature Granulocytes: 0 10*3/uL (ref 0.00–0.07)
Basophils Absolute: 0.1 10*3/uL (ref 0.0–0.1)
Basophils Relative: 1 %
Eosinophils Absolute: 0.1 10*3/uL (ref 0.0–0.5)
Eosinophils Relative: 2 %
HCT: 44.8 % (ref 39.0–52.0)
Hemoglobin: 14.3 g/dL (ref 13.0–17.0)
Immature Granulocytes: 0 %
Lymphocytes Relative: 55 %
Lymphs Abs: 2.9 10*3/uL (ref 0.7–4.0)
MCH: 29.1 pg (ref 26.0–34.0)
MCHC: 31.9 g/dL (ref 30.0–36.0)
MCV: 91.2 fL (ref 80.0–100.0)
Monocytes Absolute: 0.5 10*3/uL (ref 0.1–1.0)
Monocytes Relative: 9 %
Neutro Abs: 1.7 10*3/uL (ref 1.7–7.7)
Neutrophils Relative %: 33 %
Platelets: 220 10*3/uL (ref 150–400)
RBC: 4.91 MIL/uL (ref 4.22–5.81)
RDW: 14 % (ref 11.5–15.5)
WBC: 5.3 10*3/uL (ref 4.0–10.5)
nRBC: 0 % (ref 0.0–0.2)

## 2019-01-04 LAB — URINALYSIS, MICROSCOPIC (REFLEX): RBC / HPF: >50 RBC/hpf (ref 0–5)

## 2019-01-04 LAB — URINALYSIS, ROUTINE W REFLEX MICROSCOPIC

## 2019-01-04 LAB — PROTIME-INR
INR: 1 (ref 0.8–1.2)
Prothrombin Time: 13.5 seconds (ref 11.4–15.2)

## 2019-01-04 MED ORDER — LIDOCAINE HCL URETHRAL/MUCOSAL 2 % EX GEL
1.0000 "application " | Freq: Once | CUTANEOUS | Status: AC
  Administered 2019-01-04: 1 via URETHRAL

## 2019-01-04 NOTE — ED Provider Notes (Signed)
[facility] DEPT Provider Note   CSN: [id number] Arrival date & time: 01/04/19  0913    History   Chief Complaint Chief Complaint  Patient presents with  . Hematuria    HPI [name] is a 68 y.o. male.     68 year old male with history of chronic kidney disease, enlarged prostate/self caths presents with complaint of blood in his urine onset last night.  Patient reports difficulty cathing today due to clots obstructing his catheter.  Patient reports suprapubic pressure, denies fevers, chills, sweats, abdominal pain or flank pain, nausea or vomiting or changes in bowel habits.  Patient takes a 81mg  ASA daily, no other anticoagulants.  No other complaints or concerns.  Patient has had this happen in the past however not this bad with the clots.     Past Medical History:  Diagnosis Date  . Anemia    normal Fe, nl B12, nl retic, nl EPO July '13  . Blood transfusion without reported diagnosis   . BPH (benign prostatic hyperplasia)   . Chronic back pain   . Chronic kidney disease    CKD III, obstructive nephropathy  . Diverticulosis   . Dysuria   . Elevated PSA, greater than or equal to 20 ng/ml June '13   PSA 107  . Hemorrhoids, internal, with bleeding, prolapse 09/19/2014  . Hyperlipidemia 05/29/2014  . Hypertension   . Obstructive uropathy 11/27/2015  . Renal insufficiency   . Tuberculosis    h/o PPD +  . UTI (urinary tract infection)   . Vitamin D deficiency 09/13/2017    Patient Active Problem List   Diagnosis Date Noted  . Vitamin D deficiency 09/13/2017  . Urinary tract infection without hematuria 07/23/2017  . Low back pain 07/03/2017  . Dizziness 06/30/2017  . Acute gouty arthritis 12/01/2016  . Degenerative arthritis of knee, bilateral 11/18/2016  . Urinary retention due to benign prostatic hyperplasia 10/24/2016  . Mass of right side of neck 10/20/2016  . Gout 10/20/2016  . Knee pain, acute 10/12/2016  . Hypokalemia  05/28/2016  . Encounter for well adult exam with abnormal findings 11/27/2015  . Obstructive uropathy 11/27/2015  . Elevated PSA 11/27/2015  . Acute pyelonephritis 05/27/2015  . Generalized bloating 05/27/2015  . Constipation 05/27/2015  . Chest pain 05/27/2015  . AKI (acute kidney injury) ([place]) 05/27/2015  . Acute sinus infection 11/22/2014  . Cough 09/25/2014  . Hemorrhoids, internal, with bleeding, prolapse 09/19/2014  . 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 Hyperlipidemia 05/29/2014  . Lumbar radiculopathy 05/23/2014  . Lumbar stenosis 11/20/2013  . Abnormal glucose 11/06/2013  . Unspecified hereditary and idiopathic peripheral neuropathy 01/22/2013  . 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 CKD (chronic kidney disease) stage 3, GFR 30-59 ml/min ([facility]) 11/24/2011  . Hematuria 11/24/2011  . Hydronephrosis 11/24/2011  . Anemia 11/24/2011  . BRADYCARDIA 02/05/2010  . TINEA PEDIS 01/29/2010  . 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 PERIPHERAL EDEMA 01/29/2010  . ABNORMAL ELECTROCARDIOGRAM 01/29/2010  . 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  . Osteoarthrosis, generalized, 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  . ONYCHOMYCOSIS, TOENAILS 10/02/2007  . BPH (benign prostatic hyperplasia) 10/02/2007  . Essential hypertension 03/06/2007    Past Surgical History:  Procedure Laterality Date  . CARPAL TUNNEL RELEASE Right   . COLONOSCOPY    . HEMORRHOID BANDING    . PROSTATE SURGERY    . SPLENECTOMY          Home Medications    Prior to Admission medications   Medication Sig  Start Date End Date Taking? Authorizing Provider  acetaminophen (TYLENOL) 500 MG tablet Take 1,000 mg by mouth every 6 (six) hours as needed (pain).    [provider]  allopurinol (ZYLOPRIM) 100 MG tablet Take 2 tablets (200 mg total) by mouth daily. Patient taking differently: Take 100 mg by mouth daily.  05/12/18   Biagio Borg, MD  aspirin EC 81 MG tablet Take 81 mg by mouth daily.    [provider]  atorvastatin (LIPITOR) 40 MG tablet TAKE 1 TABLET BY MOUTH ONCE DAILY Patient taking differently: Take 40 mg by mouth at bedtime.  01/10/18   Biagio Borg, MD  cholecalciferol (VITAMIN D3) 25 MCG (1000 UT) tablet Take 1,000 Units by mouth daily.    [provider]  colchicine 0.6 MG tablet Take 0.6 mg by mouth daily. 10/21/18   [provider]  finasteride (PROSCAR) 5 MG tablet TAKE 1 TABLET BY MOUTH ONCE DAILY Patient taking differently: Take 5 mg by mouth daily.  06/12/18   Biagio Borg, MD  FOLIC ACID PO Take 1 tablet by mouth daily.    [provider]  lidocaine (XYLOCAINE) 2 % jelly Place 1 application into the urethra as needed. Patient taking differently: Place 1 application into the urethra daily as needed (pain from disposable catheter).  04/21/18   Recardo Evangelist, PA-C  metoprolol tartrate (LOPRESSOR) 25 MG tablet Take 1 tablet by mouth twice daily Patient taking differently: Take 25 mg by mouth 2 (two) times daily. (BETA BLOCKER) 08/23/18   Biagio Borg, MD  Misc Natural Products (TART CHERRY ADVANCED PO) Take 1 tablet by mouth daily.    [provider]  POTASSIUM PO Take 1 tablet by mouth daily.    [provider]  tamsulosin (FLOMAX) 0.4 MG CAPS capsule Take 0.4 mg by mouth daily. 10/17/18   [provider]    Family History Family History  Problem Relation Age of Onset  . Diabetes Mother   . Stroke Brother   . Hearing loss Brother   . Colon cancer Neg Hx   . Rectal cancer Neg Hx   . Stomach cancer Neg Hx   . Esophageal cancer Neg Hx     Social History Social History   Tobacco Use  . Smoking status: Former Smoker    Types: Cigarettes  . Smokeless tobacco: Never Used  . Tobacco comment: Quit 1981  Substance Use Topics  . Alcohol use: No    Alcohol/week: 0.0 standard drinks    Comment: Quit 2004  . Drug use: No     Allergies   Patient has no known allergies.   Review of Systems Review of  Systems  Constitutional: Negative for chills and fever.  Gastrointestinal: Negative for abdominal pain, constipation, diarrhea, nausea and vomiting.  Genitourinary: Positive for difficulty urinating.  Skin: Negative for rash and wound.  Allergic/Immunologic: Negative for immunocompromised state.  Hematological: Does not bruise/bleed easily.  All other systems reviewed and are negative.    Physical Exam Updated Vital Signs BP (!) 194/116   Pulse (!) 52   Temp 98.5 F (36.9 C) (Oral)   Resp 15   Ht 6\' 1"  (1.854 m)   Wt 113.4 kg   SpO2 96%   BMI 32.98 kg/m   Physical Exam Vitals signs and nursing note reviewed.  Constitutional:      General: He is not in acute distress.    Appearance: He is well-developed. He is not diaphoretic.  HENT:  Head: Normocephalic and atraumatic.  Cardiovascular:     Rate and Rhythm: Normal rate and regular rhythm.     Pulses: Normal pulses.     Heart sounds: Normal heart sounds.  Pulmonary:     Effort: Pulmonary effort is normal.     Breath sounds: Normal breath sounds.  Abdominal:     Tenderness: There is abdominal tenderness in the suprapubic area. There is no right CVA tenderness or left CVA tenderness.  Musculoskeletal:     Right lower leg: No edema.     Left lower leg: No edema.  Skin:    General: Skin is warm and dry.     Findings: No erythema or rash.  Neurological:     Mental Status: He is alert and oriented to person, place, and time.  Psychiatric:        Behavior: Behavior normal.      ED Treatments / Results  Labs (all labs ordered are listed, but only abnormal results are displayed) Labs Reviewed  URINALYSIS, ROUTINE W REFLEX MICROSCOPIC - Abnormal; Notable for the following components:      Result Value   Color, Urine RED (*)    APPearance TURBID (*)    Glucose, UA   (*)    Value: TEST NOT REPORTED DUE TO COLOR INTERFERENCE OF URINE PIGMENT   Hgb urine dipstick   (*)    Value: TEST NOT REPORTED DUE TO COLOR  INTERFERENCE OF URINE PIGMENT   Bilirubin Urine   (*)    Value: TEST NOT REPORTED DUE TO COLOR INTERFERENCE OF URINE PIGMENT   Ketones, ur   (*)    Value: TEST NOT REPORTED DUE TO COLOR INTERFERENCE OF URINE PIGMENT   Protein, ur   (*)    Value: TEST NOT REPORTED DUE TO COLOR INTERFERENCE OF URINE PIGMENT   Nitrite   (*)    Value: TEST NOT REPORTED DUE TO COLOR INTERFERENCE OF URINE PIGMENT   Leukocytes,Ua   (*)    Value: TEST NOT REPORTED DUE TO COLOR INTERFERENCE OF URINE PIGMENT   All other components within normal limits  BASIC METABOLIC PANEL - Abnormal; Notable for the following components:   BUN 30 (*)    Creatinine, Ser 2.42 (*)    Calcium 8.8 (*)    GFR calc non Af Amer 26 (*)    GFR calc Af Amer 31 (*)    All other components within normal limits  URINALYSIS, MICROSCOPIC (REFLEX) - Abnormal; Notable for the following components:   Bacteria, UA FEW (*)    All other components within normal limits  CBC WITH DIFFERENTIAL/PLATELET  PROTIME-INR    EKG None  Radiology No results found.  Procedures Procedures (including critical care time)  Medications Ordered in ED Medications  lidocaine (XYLOCAINE) 2 % jelly 1 application (1 application Urethral Given by Other 01/04/19 1251)     Initial Impression / Assessment and Plan / ED Course  I have reviewed the triage vital signs and the nursing notes.  Pertinent labs & imaging results that were available during my care of the patient were reviewed by me and considered in my medical decision making (see chart for details).  Clinical Course as of Jan 03 1438  Thu Jul 30, 509  [id number] 68 year old male presents with complaint of blood in his urine.  Patient self caths with a 55 [place] daily due to enlarged prostate, first noticed blood in the urine last night, states today he is unable to get much urine out  and reports clots in his catheter.  Bladder scan shows 250 mL's of urine in the bladder, nursing staff unable to pass a  Foley.  I have attempted with a 51 [place] coud unsuccessfully.  Urology paged for consult.  Case discussed with Dr. Junious Silk with neurology who will come and see the patient.   [LM]  1439 Care signed out to Gertie Fey, [place] pending UA evaluation and treatment plan.    [LM]    Clinical Course User Index [LM] Tacy Learn, PA-C      Final Clinical Impressions(s) / ED Diagnoses   Final diagnoses:  None    ED Discharge Orders    None       Tacy Learn, PA-C 01/04/19 1440    Maudie Flakes, MD 01/05/19 [phone]

## 2019-01-04 NOTE — ED Triage Notes (Signed)
Patient reports that he self cath's himself and noted blood in his urine last night. Patient also c/o abdominal discomfort.

## 2019-01-04 NOTE — Discharge Instructions (Signed)
Please remove foley in 3 days if your urine is no longer bloody.  If you have any concerns, please call and follow up with urologist for further care.

## 2019-01-04 NOTE — Consult Note (Signed)
Consultation: Gross hematuria, difficult Foley Requested by: Dr. Carmin [name]  History of Present Illness: [name] had sore follows with Dr. Lovena [name] for BPH (about a 300 g prostate), elevated PSA, atonic bladder and retention.  A recent prostate biopsy was negative.  The patient either keeps a catheter and sometimes does CIC.  He had trouble doing CIC earlier today and presented to emergency department.  The patient and nurse had trouble placing a catheter.  Urology was consulted.  Since that time the patient was able to do an in and out catheter and he got 900 cc of light red urine.  Sitting in the urinal.  I talked to him about simply placing a catheter to drain the bladder and let any bleeding from the prostate or prostatic urethra settle down and he elected to proceed.  Otherwise the patient feels well.  He has had no fever.  The patient is considering a robotic simple prostatectomy versus prostate artery embolization.  Past Medical History:  Diagnosis Date  . Anemia    normal Fe, nl B12, nl retic, nl EPO July '13  . Blood transfusion without reported diagnosis   . BPH (benign prostatic hyperplasia)   . Chronic back pain   . Chronic kidney disease    CKD III, obstructive nephropathy  . Diverticulosis   . Dysuria   . Elevated PSA, greater than or equal to 20 ng/ml June '13   PSA 107  . Hemorrhoids, internal, with bleeding, prolapse 09/19/2014  . Hyperlipidemia 05/29/2014  . Hypertension   . Obstructive uropathy 11/27/2015  . Renal insufficiency   . Tuberculosis    h/o PPD +  . UTI (urinary tract infection)   . Vitamin D deficiency 09/13/2017   Past Surgical History:  Procedure Laterality Date  . CARPAL TUNNEL RELEASE Right   . COLONOSCOPY    . HEMORRHOID BANDING    . PROSTATE SURGERY    . SPLENECTOMY      Home Medications:  (Not in a hospital admission)  Allergies: No Known Allergies  Family History  Problem Relation Age of Onset  . Diabetes Mother   . Stroke Brother    . Hearing loss Brother   . Colon cancer Neg Hx   . Rectal cancer Neg Hx   . Stomach cancer Neg Hx   . Esophageal cancer Neg Hx    Social History:  reports that he has quit smoking. His smoking use included cigarettes. He has never used smokeless tobacco. He reports that he does not drink alcohol or use drugs.  ROS: A complete review of systems was performed.  All systems are negative except for pertinent findings as noted. Review of Systems  All other systems reviewed and are negative.    Physical Exam:  Vital signs in last 24 hours: Temp:  [98.5 F (36.9 C)] 98.5 F (36.9 C) (07/30 0925) Pulse Rate:  [51-64] 52 (07/30 1342) Resp:  [15-16] 15 (07/30 1342) BP: (140-194)/(98-116) 194/116 (07/30 1342) SpO2:  [96 %-99 %] 96 % (07/30 1342) Weight:  [113.4 kg] 113.4 kg (07/30 0928) General:  Alert and oriented, No acute distress HEENT: Normocephalic, atraumatic Neck: No JVD or lymphadenopathy Cardiovascular: Regular rate and rhythm Lungs: Regular rate and effort Abdomen: Soft, nontender, nondistended, no abdominal masses Back: No CVA tenderness Extremities: No edema Neurologic: Grossly intact GU: Penis circumcised and without mass or lesion.  Scrotum normal.  Procedure: The patient was prepped and draped in the usual sterile fashion.  I passed a 20 [place] coud  catheter without difficulty and this drained light pink to purple urine.  There were no clots.  Drained about 300 cc.  Laboratory Data:  Results for orders placed or performed during the hospital encounter of 01/04/19 (from the past 24 hour(s))  Urinalysis, Routine w reflex microscopic- may I&O cath if menses     Status: Abnormal   Collection Time: 01/04/19  1:04 PM  Result Value Ref Range   Color, Urine RED (A) YELLOW   APPearance TURBID (A) CLEAR   Specific Gravity, Urine  1.005 - 1.030    TEST NOT REPORTED DUE TO COLOR INTERFERENCE OF URINE PIGMENT   pH  5.0 - 8.0    TEST NOT REPORTED DUE TO COLOR INTERFERENCE OF  URINE PIGMENT   Glucose, UA (A) NEGATIVE mg/dL    TEST NOT REPORTED DUE TO COLOR INTERFERENCE OF URINE PIGMENT   Hgb urine dipstick (A) NEGATIVE    TEST NOT REPORTED DUE TO COLOR INTERFERENCE OF URINE PIGMENT   Bilirubin Urine (A) NEGATIVE    TEST NOT REPORTED DUE TO COLOR INTERFERENCE OF URINE PIGMENT   Ketones, ur (A) NEGATIVE mg/dL    TEST NOT REPORTED DUE TO COLOR INTERFERENCE OF URINE PIGMENT   Protein, ur (A) NEGATIVE mg/dL    TEST NOT REPORTED DUE TO COLOR INTERFERENCE OF URINE PIGMENT   Nitrite (A) NEGATIVE    TEST NOT REPORTED DUE TO COLOR INTERFERENCE OF URINE PIGMENT   Leukocytes,Ua (A) NEGATIVE    TEST NOT REPORTED DUE TO COLOR INTERFERENCE OF URINE PIGMENT  Urinalysis, Microscopic (reflex)     Status: Abnormal   Collection Time: 01/04/19  1:04 PM  Result Value Ref Range   RBC / HPF >50 0 - 5 RBC/hpf   WBC, UA 6-10 0 - 5 WBC/hpf   Bacteria, UA FEW (A) NONE SEEN   Squamous Epithelial / LPF 11-20 0 - 5   Urine-Other MICROSCOPIC EXAM PERFORMED ON UNCONCENTRATED URINE   Basic metabolic panel     Status: Abnormal   Collection Time: 01/04/19  1:44 PM  Result Value Ref Range   Sodium 140 135 - 145 mmol/L   Potassium 3.6 3.5 - 5.1 mmol/L   Chloride 103 98 - 111 mmol/L   CO2 28 22 - 32 mmol/L   Glucose, Bld 89 70 - 99 mg/dL   BUN 30 (H) 8 - 23 mg/dL   Creatinine, Ser 2.42 (H) 0.61 - 1.24 mg/dL   Calcium 8.8 (L) 8.9 - 10.3 mg/dL   GFR calc non Af Amer 26 (L) >60 mL/min   GFR calc Af Amer 31 (L) >60 mL/min   Anion gap 9 5 - 15  CBC with Differential     Status: None   Collection Time: 01/04/19  1:44 PM  Result Value Ref Range   WBC 5.3 4.0 - 10.5 K/uL   RBC 4.91 4.22 - 5.81 MIL/uL   Hemoglobin 14.3 13.0 - 17.0 g/dL   HCT 44.8 39.0 - 52.0 %   MCV 91.2 80.0 - 100.0 fL   MCH 29.1 26.0 - 34.0 pg   MCHC 31.9 30.0 - 36.0 g/dL   RDW 14.0 11.5 - 15.5 %   Platelets 220 150 - 400 K/uL   nRBC 0.0 0.0 - 0.2 %   Neutrophils Relative % 33 %   Neutro Abs 1.7 1.7 - 7.7 K/uL    Lymphocytes Relative 55 %   Lymphs Abs 2.9 0.7 - 4.0 K/uL   Monocytes Relative 9 %   Monocytes Absolute 0.5  0.1 - 1.0 K/uL   Eosinophils Relative 2 %   Eosinophils Absolute 0.1 0.0 - 0.5 K/uL   Basophils Relative 1 %   Basophils Absolute 0.1 0.0 - 0.1 K/uL   Immature Granulocytes 0 %   Abs Immature Granulocytes 0.00 0.00 - 0.07 K/uL  Protime-INR     Status: None   Collection Time: 01/04/19  1:44 PM  Result Value Ref Range   Prothrombin Time 13.5 11.4 - 15.2 seconds   INR 1.0 0.8 - 1.2   No results found for this or any previous visit (from the past 240 hour(s)). Creatinine: Recent Labs    01/04/19 1344  CREATININE 2.42*    Impression/Assessment/plan:  Gross hematuria-likely from prostatic bleeding.  I discussed with the patient and reminded he follow-up with Dr. Lovena [name].  Urinary retention/atonic bladder- continue Foley catheter.  The patient is well versed with catheters and CIC.  Once the urine clears he can remove the catheter and resume CIC.  BPH-we will continue tamsulosin and finasteride.  We talked about an robotic simple versus prostate artery embolization.  Unfortunately the patient has chronic kidney disease and he may not be a good candidate for PAE as a lot of contrast is used during this procedure.  Preserving fertility is important to the patient.  His wife is younger.  It does not sound like he is ejaculating now.  If he does ejaculate we discussed he should bank sperm.  Patient can be discharged.  Discussed with PA Rona Ravens.  He can follow-up with Dr. Lovena [name] as planned.  Festus Aloe 01/04/2019, 3:38 PM

## 2019-01-04 NOTE — ED Notes (Signed)
Bladder scan shows 230 ml. Unable to place 14g foley without resistance and pain for pt. Attempted to irrigate, bo return of urine or blood/clots. MD notified.

## 2019-01-04 NOTE — ED Notes (Signed)
Provider also attempted foley/coude catheter with no success. Pt tried to self cath, got about 1 ml of bloody urine

## 2019-01-04 NOTE — ED Notes (Signed)
Leg bag applied to pt

## 2019-01-04 NOTE — ED Provider Notes (Signed)
Received signout from previous provider, please refer to her full note.  Patient presents with urinary obstruction.  History of self cath but unable to cath himself requiring ER visit.  Was then ER, he was able to self cath and able to relieve approximately 900 cc of blood-tinged urine.  Urologist, Dr. Junious Silk has seen evaluate patient and proceed to insert an indwelling Foley.  Patient is stable to go home.  Current urine shows blood-tinged but improved from prior.  Patient likely may remove his Foley in 3 days if his symptoms resolved.  Otherwise, he can follow-up with urology for further care.  Patient was found to be hypertensive with a blood pressure of 194/116, suspect secondary to pain from having difficulty urinating.  Will recheck blood pressure.  BP (!) 155/86   Pulse (!) 56   Temp 98.5 F (36.9 C) (Oral)   Resp 17   Ht 6\' 1"  (1.854 m)   Wt 113.4 kg   SpO2 98%   BMI 32.98 kg/m    Results for orders placed or performed during the hospital encounter of 01/04/19  Urinalysis, Routine w reflex microscopic- may I&O cath if menses  Result Value Ref Range   Color, Urine RED (A) YELLOW   APPearance TURBID (A) CLEAR   Specific Gravity, Urine  1.005 - 1.030    TEST NOT REPORTED DUE TO COLOR INTERFERENCE OF URINE PIGMENT   pH  5.0 - 8.0    TEST NOT REPORTED DUE TO COLOR INTERFERENCE OF URINE PIGMENT   Glucose, UA (A) NEGATIVE mg/dL    TEST NOT REPORTED DUE TO COLOR INTERFERENCE OF URINE PIGMENT   Hgb urine dipstick (A) NEGATIVE    TEST NOT REPORTED DUE TO COLOR INTERFERENCE OF URINE PIGMENT   Bilirubin Urine (A) NEGATIVE    TEST NOT REPORTED DUE TO COLOR INTERFERENCE OF URINE PIGMENT   Ketones, ur (A) NEGATIVE mg/dL    TEST NOT REPORTED DUE TO COLOR INTERFERENCE OF URINE PIGMENT   Protein, ur (A) NEGATIVE mg/dL    TEST NOT REPORTED DUE TO COLOR INTERFERENCE OF URINE PIGMENT   Nitrite (A) NEGATIVE    TEST NOT REPORTED DUE TO COLOR INTERFERENCE OF URINE PIGMENT   Leukocytes,Ua (A)  NEGATIVE    TEST NOT REPORTED DUE TO COLOR INTERFERENCE OF URINE PIGMENT  Basic metabolic panel  Result Value Ref Range   Sodium 140 135 - 145 mmol/L   Potassium 3.6 3.5 - 5.1 mmol/L   Chloride 103 98 - 111 mmol/L   CO2 28 22 - 32 mmol/L   Glucose, Bld 89 70 - 99 mg/dL   BUN 30 (H) 8 - 23 mg/dL   Creatinine, Ser 2.42 (H) 0.61 - 1.24 mg/dL   Calcium 8.8 (L) 8.9 - 10.3 mg/dL   GFR calc non Af Amer 26 (L) >60 mL/min   GFR calc Af Amer 31 (L) >60 mL/min   Anion gap 9 5 - 15  CBC with Differential  Result Value Ref Range   WBC 5.3 4.0 - 10.5 K/uL   RBC 4.91 4.22 - 5.81 MIL/uL   Hemoglobin 14.3 13.0 - 17.0 g/dL   HCT 44.8 39.0 - 52.0 %   MCV 91.2 80.0 - 100.0 fL   MCH 29.1 26.0 - 34.0 pg   MCHC 31.9 30.0 - 36.0 g/dL   RDW 14.0 11.5 - 15.5 %   Platelets 220 150 - 400 K/uL   nRBC 0.0 0.0 - 0.2 %   Neutrophils Relative % 33 %  Neutro Abs 1.7 1.7 - 7.7 K/uL   Lymphocytes Relative 55 %   Lymphs Abs 2.9 0.7 - 4.0 K/uL   Monocytes Relative 9 %   Monocytes Absolute 0.5 0.1 - 1.0 K/uL   Eosinophils Relative 2 %   Eosinophils Absolute 0.1 0.0 - 0.5 K/uL   Basophils Relative 1 %   Basophils Absolute 0.1 0.0 - 0.1 K/uL   Immature Granulocytes 0 %   Abs Immature Granulocytes 0.00 0.00 - 0.07 K/uL  Protime-INR  Result Value Ref Range   Prothrombin Time 13.5 11.4 - 15.2 seconds   INR 1.0 0.8 - 1.2  Urinalysis, Microscopic (reflex)  Result Value Ref Range   RBC / HPF >50 0 - 5 RBC/hpf   WBC, UA 6-10 0 - 5 WBC/hpf   Bacteria, UA FEW (A) NONE SEEN   Squamous Epithelial / LPF 11-20 0 - 5   Urine-Other MICROSCOPIC EXAM PERFORMED ON UNCONCENTRATED URINE    No results found.     Domenic Moras, PA-C 01/04/19 1618    Carmin Muskrat, MD 01/04/19 2125

## 2019-01-11 DIAGNOSIS — R310 Gross hematuria: Secondary | ICD-10-CM | POA: Diagnosis not present

## 2019-01-26 DIAGNOSIS — R338 Other retention of urine: Secondary | ICD-10-CM | POA: Diagnosis not present

## 2019-02-06 ENCOUNTER — Other Ambulatory Visit: Payer: Self-pay | Admitting: Urology

## 2019-02-09 ENCOUNTER — Inpatient Hospital Stay (HOSPITAL_COMMUNITY)
Admission: EM | Admit: 2019-02-09 | Discharge: 2019-02-11 | DRG: 726 | Disposition: A | Discharge status: Home / Self Care | Payer: Medicare Other | Attending: Urology | Admitting: Urology

## 2019-02-09 ENCOUNTER — Other Ambulatory Visit: Payer: Self-pay

## 2019-02-09 ENCOUNTER — Encounter (HOSPITAL_COMMUNITY): Payer: Self-pay | Admitting: *Deleted

## 2019-02-09 DIAGNOSIS — I10 Essential (primary) hypertension: Secondary | ICD-10-CM | POA: Diagnosis not present

## 2019-02-09 DIAGNOSIS — N390 Urinary tract infection, site not specified: Secondary | ICD-10-CM | POA: Diagnosis not present

## 2019-02-09 DIAGNOSIS — Z9081 Acquired absence of spleen: Secondary | ICD-10-CM | POA: Diagnosis not present

## 2019-02-09 DIAGNOSIS — Z87891 Personal history of nicotine dependence: Secondary | ICD-10-CM

## 2019-02-09 DIAGNOSIS — R338 Other retention of urine: Secondary | ICD-10-CM | POA: Diagnosis not present

## 2019-02-09 DIAGNOSIS — R61 Generalized hyperhidrosis: Secondary | ICD-10-CM | POA: Diagnosis not present

## 2019-02-09 DIAGNOSIS — R319 Hematuria, unspecified: Secondary | ICD-10-CM | POA: Diagnosis not present

## 2019-02-09 DIAGNOSIS — Z20828 Contact with and (suspected) exposure to other viral communicable diseases: Secondary | ICD-10-CM | POA: Diagnosis present

## 2019-02-09 DIAGNOSIS — N3289 Other specified disorders of bladder: Secondary | ICD-10-CM | POA: Diagnosis not present

## 2019-02-09 DIAGNOSIS — Z7982 Long term (current) use of aspirin: Secondary | ICD-10-CM

## 2019-02-09 DIAGNOSIS — E559 Vitamin D deficiency, unspecified: Secondary | ICD-10-CM | POA: Diagnosis not present

## 2019-02-09 DIAGNOSIS — N029 Recurrent and persistent hematuria with unspecified morphologic changes: Secondary | ICD-10-CM | POA: Diagnosis not present

## 2019-02-09 DIAGNOSIS — N183 Chronic kidney disease, stage 3 unspecified: Secondary | ICD-10-CM | POA: Diagnosis present

## 2019-02-09 DIAGNOSIS — M25461 Effusion, right knee: Secondary | ICD-10-CM | POA: Diagnosis not present

## 2019-02-09 DIAGNOSIS — Z79899 Other long term (current) drug therapy: Secondary | ICD-10-CM

## 2019-02-09 DIAGNOSIS — D649 Anemia, unspecified: Secondary | ICD-10-CM | POA: Diagnosis not present

## 2019-02-09 DIAGNOSIS — M109 Gout, unspecified: Secondary | ICD-10-CM | POA: Diagnosis present

## 2019-02-09 DIAGNOSIS — D62 Acute posthemorrhagic anemia: Secondary | ICD-10-CM | POA: Diagnosis not present

## 2019-02-09 DIAGNOSIS — M549 Dorsalgia, unspecified: Secondary | ICD-10-CM | POA: Diagnosis not present

## 2019-02-09 DIAGNOSIS — T83098A Other mechanical complication of other indwelling urethral catheter, initial encounter: Secondary | ICD-10-CM | POA: Diagnosis not present

## 2019-02-09 DIAGNOSIS — Z87440 Personal history of urinary (tract) infections: Secondary | ICD-10-CM | POA: Diagnosis not present

## 2019-02-09 DIAGNOSIS — N189 Chronic kidney disease, unspecified: Secondary | ICD-10-CM | POA: Diagnosis not present

## 2019-02-09 DIAGNOSIS — N401 Enlarged prostate with lower urinary tract symptoms: Secondary | ICD-10-CM | POA: Diagnosis not present

## 2019-02-09 DIAGNOSIS — G8929 Other chronic pain: Secondary | ICD-10-CM | POA: Diagnosis present

## 2019-02-09 DIAGNOSIS — E785 Hyperlipidemia, unspecified: Secondary | ICD-10-CM | POA: Diagnosis present

## 2019-02-09 DIAGNOSIS — Z03818 Encounter for observation for suspected exposure to other biological agents ruled out: Secondary | ICD-10-CM | POA: Diagnosis not present

## 2019-02-09 DIAGNOSIS — R310 Gross hematuria: Secondary | ICD-10-CM | POA: Diagnosis present

## 2019-02-09 DIAGNOSIS — I129 Hypertensive chronic kidney disease with stage 1 through stage 4 chronic kidney disease, or unspecified chronic kidney disease: Secondary | ICD-10-CM | POA: Diagnosis present

## 2019-02-09 DIAGNOSIS — R1084 Generalized abdominal pain: Secondary | ICD-10-CM | POA: Diagnosis not present

## 2019-02-09 DIAGNOSIS — N138 Other obstructive and reflux uropathy: Secondary | ICD-10-CM | POA: Diagnosis not present

## 2019-02-09 DIAGNOSIS — R339 Retention of urine, unspecified: Secondary | ICD-10-CM

## 2019-02-09 DIAGNOSIS — N320 Bladder-neck obstruction: Secondary | ICD-10-CM | POA: Diagnosis not present

## 2019-02-09 DIAGNOSIS — N1832 Chronic kidney disease, stage 3b: Secondary | ICD-10-CM | POA: Diagnosis present

## 2019-02-09 LAB — CBC
HCT: 43.9 % (ref 39.0–52.0)
Hemoglobin: 13.9 g/dL (ref 13.0–17.0)
MCH: 28.7 pg (ref 26.0–34.0)
MCHC: 31.7 g/dL (ref 30.0–36.0)
MCV: 90.7 fL (ref 80.0–100.0)
Platelets: 243 10*3/uL (ref 150–400)
RBC: 4.84 MIL/uL (ref 4.22–5.81)
RDW: 13.3 % (ref 11.5–15.5)
WBC: 7.7 10*3/uL (ref 4.0–10.5)
nRBC: 0 % (ref 0.0–0.2)

## 2019-02-09 LAB — BASIC METABOLIC PANEL
Anion gap: 11 (ref 5–15)
BUN: 37 mg/dL — ABNORMAL HIGH (ref 8–23)
CO2: 27 mmol/L (ref 22–32)
Calcium: 8.9 mg/dL (ref 8.9–10.3)
Chloride: 105 mmol/L (ref 98–111)
Creatinine, Ser: 2.72 mg/dL — ABNORMAL HIGH (ref 0.61–1.24)
GFR calc Af Amer: 27 mL/min — ABNORMAL LOW (ref >60)
GFR calc non Af Amer: 23 mL/min — ABNORMAL LOW (ref >60)
Glucose, Bld: 139 mg/dL — ABNORMAL HIGH (ref 70–99)
Potassium: 3.2 mmol/L — ABNORMAL LOW (ref 3.5–5.1)
Sodium: 143 mmol/L (ref 135–145)

## 2019-02-09 LAB — PROTIME-INR
INR: 1.2 (ref 0.8–1.2)
Prothrombin Time: 14.7 seconds (ref 11.4–15.2)

## 2019-02-09 MED ORDER — ONDANSETRON HCL 4 MG/2ML IJ SOLN: 4.0000 mg | INTRAMUSCULAR | Status: DC | PRN

## 2019-02-09 MED ORDER — ATORVASTATIN CALCIUM 40 MG PO TABS
40.0000 mg | ORAL_TABLET | Freq: Every day | ORAL | Status: DC
  Administered 2019-02-10: 18:00:00 40 mg via ORAL
  Filled 2019-02-09: qty 1

## 2019-02-09 MED ORDER — DIPHENHYDRAMINE HCL 50 MG/ML IJ SOLN: 12.5000 mg | Freq: Four times a day (QID) | INTRAMUSCULAR | Status: DC | PRN

## 2019-02-09 MED ORDER — MORPHINE SULFATE (PF) 4 MG/ML IV SOLN
4.0000 mg | Freq: Once | INTRAVENOUS | Status: AC
  Administered 2019-02-09: 4 mg via INTRAVENOUS
  Filled 2019-02-09: qty 1

## 2019-02-09 MED ORDER — ACETAMINOPHEN 500 MG PO TABS: 1000.0000 mg | ORAL_TABLET | Freq: Four times a day (QID) | ORAL | Status: DC | PRN

## 2019-02-09 MED ORDER — DOCUSATE SODIUM 100 MG PO CAPS
100.0000 mg | ORAL_CAPSULE | Freq: Two times a day (BID) | ORAL | Status: DC
  Administered 2019-02-09 – 2019-02-11 (×4): 100 mg via ORAL
  Filled 2019-02-09 (×5): qty 1

## 2019-02-09 MED ORDER — ALLOPURINOL 100 MG PO TABS
200.0000 mg | ORAL_TABLET | Freq: Every day | ORAL | Status: DC
  Administered 2019-02-10 – 2019-02-11 (×2): 200 mg via ORAL
  Filled 2019-02-09 (×2): qty 2

## 2019-02-09 MED ORDER — METOPROLOL TARTRATE 25 MG PO TABS
25.0000 mg | ORAL_TABLET | Freq: Two times a day (BID) | ORAL | Status: DC
  Administered 2019-02-09 – 2019-02-11 (×4): 25 mg via ORAL
  Filled 2019-02-09 (×4): qty 1

## 2019-02-09 MED ORDER — FINASTERIDE 5 MG PO TABS
5.0000 mg | ORAL_TABLET | Freq: Every day | ORAL | Status: DC
  Administered 2019-02-10 – 2019-02-11 (×2): 5 mg via ORAL
  Filled 2019-02-09 (×2): qty 1

## 2019-02-09 MED ORDER — COLCHICINE 0.6 MG PO TABS
0.6000 mg | ORAL_TABLET | Freq: Every day | ORAL | Status: DC
  Administered 2019-02-10 – 2019-02-11 (×2): 0.6 mg via ORAL
  Filled 2019-02-09 (×2): qty 1

## 2019-02-09 MED ORDER — DIPHENHYDRAMINE HCL 12.5 MG/5ML PO ELIX: 12.5000 mg | ORAL_SOLUTION | Freq: Four times a day (QID) | ORAL | Status: DC | PRN

## 2019-02-09 MED ORDER — HYDROCODONE-ACETAMINOPHEN 5-325 MG PO TABS
1.0000 | ORAL_TABLET | ORAL | Status: DC | PRN
  Administered 2019-02-09 (×2): 1 via ORAL
  Administered 2019-02-11: 2 via ORAL
  Filled 2019-02-09 (×2): qty 2

## 2019-02-09 MED ORDER — ZOLPIDEM TARTRATE 5 MG PO TABS: 5.0000 mg | ORAL_TABLET | Freq: Every evening | ORAL | Status: DC | PRN

## 2019-02-09 MED ORDER — BELLADONNA ALKALOIDS-OPIUM 16.2-60 MG RE SUPP
1.0000 | Freq: Four times a day (QID) | RECTAL | Status: DC | PRN
  Filled 2019-02-09: qty 1

## 2019-02-09 MED ORDER — SODIUM CHLORIDE 0.9 % IV SOLN
INTRAVENOUS | Status: DC
  Administered 2019-02-09: 23:00:00 via INTRAVENOUS
  Administered 2019-02-09: 20:00:00 75 mL/h via INTRAVENOUS

## 2019-02-09 NOTE — ED Notes (Signed)
Called floor to give report nurse not ready told will call back ed for report

## 2019-02-09 NOTE — H&P (Signed)
H&P  Chief Complaint: Hematuria with clot retention  History of Present Illness: [name] is a 68 y.o. year old with CKD (baseline Cr 2.4) and a very enlarged prostate/BPH with retention and hematuria.  He is scheduled to undergo a robotic simple prostatectomy by Dr. Alyson Ingles in early October.  He removed his indwelling catheter with plans to restart CIC (which he has done for years) but noted increased blood per urethra and was unable to catheterize.  Multiple attempts to catheterize him by the ER staff have been unsuccessful.  He is currently in acute pain with SP and back pain that is severe.  Past Medical History:  Diagnosis Date  . Anemia    normal Fe, nl B12, nl retic, nl EPO July '13  . Blood transfusion without reported diagnosis   . BPH (benign prostatic hyperplasia)   . Chronic back pain   . Chronic kidney disease    CKD III, obstructive nephropathy  . Diverticulosis   . Dysuria   . Elevated PSA, greater than or equal to 20 ng/ml June '13   PSA 107  . Hemorrhoids, internal, with bleeding, prolapse 09/19/2014  . Hyperlipidemia 05/29/2014  . Hypertension   . Obstructive uropathy 11/27/2015  . Renal insufficiency   . Tuberculosis    h/o PPD +  . UTI (urinary tract infection)   . Vitamin D deficiency 09/13/2017    Past Surgical History:  Procedure Laterality Date  . CARPAL TUNNEL RELEASE Right   . COLONOSCOPY    . HEMORRHOID BANDING    . PROSTATE SURGERY    . SPLENECTOMY      Home Medications:  No current facility-administered medications on file prior to encounter.    Current Outpatient Medications on File Prior to Encounter  Medication Sig Dispense Refill  . acetaminophen (TYLENOL) 500 MG tablet Take 1,000 mg by mouth every 6 (six) hours as needed (pain).    Marland Kitchen allopurinol (ZYLOPRIM) 100 MG tablet Take 2 tablets (200 mg total) by mouth daily. (Patient taking differently: Take 100 mg by mouth daily. ) 180 tablet 3  . aspirin EC 81 MG tablet Take 81 mg by mouth  daily.    Marland Kitchen atorvastatin (LIPITOR) 40 MG tablet TAKE 1 TABLET BY MOUTH ONCE DAILY (Patient taking differently: Take 40 mg by mouth at bedtime. ) 90 tablet 3  . cholecalciferol (VITAMIN D3) 25 MCG (1000 UT) tablet Take 1,000 Units by mouth daily.    . colchicine 0.6 MG tablet Take 0.6 mg by mouth daily.    . finasteride (PROSCAR) 5 MG tablet TAKE 1 TABLET BY MOUTH ONCE DAILY (Patient taking differently: Take 5 mg by mouth daily. ) 90 tablet 0  . FOLIC ACID PO Take 1 tablet by mouth daily.    Marland Kitchen lidocaine (XYLOCAINE) 2 % jelly Place 1 application into the urethra as needed. (Patient taking differently: Place 1 application into the urethra daily as needed (pain from disposable catheter). ) 30 mL 0  . metoprolol tartrate (LOPRESSOR) 25 MG tablet Take 1 tablet by mouth twice daily (Patient taking differently: Take 25 mg by mouth 2 (two) times daily. (BETA BLOCKER)) 180 tablet 0  . Misc Natural Products (TART CHERRY ADVANCED PO) Take 1 tablet by mouth daily.    Marland Kitchen POTASSIUM PO Take 1 tablet by mouth daily.    . tamsulosin (FLOMAX) 0.4 MG CAPS capsule Take 0.4 mg by mouth daily.       Allergies: No Known Allergies  Family History  Problem Relation Age of  Onset  . Diabetes Mother   . Stroke Brother   . Hearing loss Brother   . Colon cancer Neg Hx   . Rectal cancer Neg Hx   . Stomach cancer Neg Hx   . Esophageal cancer Neg Hx     Social History:  reports that he has quit smoking. His smoking use included cigarettes. He has never used smokeless tobacco. He reports that he does not drink alcohol or use drugs.  ROS: A complete review of systems was performed.  All systems are negative except for pertinent findings as noted.  Physical Exam:  Vital signs in last 24 hours: Temp:  [98 F (36.7 C)] 98 F (36.7 C) (09/04 1237) Pulse Rate:  [92-102] 92 (09/04 1539) Resp:  [18] 18 (09/04 1539) BP: (143-165)/(95-127) 143/95 (09/04 1539) SpO2:  [96 %-97 %] 97 % (09/04 1539) Weight:  [113.4 kg] 113.4  kg (09/04 1243) Constitutional:  Alert and oriented, No acute distress Cardiovascular: Regular rate and rhythm, No JVD Respiratory: Normal respiratory effort GI: Abdomen is soft, nontender, nondistended, no abdominal masses GU: No CVA tenderness, Normal male phallus with blood at meatus Lymphatic: No lymphadenopathy Neurologic: Grossly intact, no focal deficits Psychiatric: Normal mood and affect   Laboratory Data:  Recent Labs    02/09/19 1336  WBC 7.7  HGB 13.9  HCT 43.9  PLT 243    Recent Labs    02/09/19 1336  NA 143  K 3.2*  CL 105  GLUCOSE 139*  BUN 37*  CALCIUM 8.9  CREATININE 2.72*     Results for orders placed or performed during the hospital encounter of 02/09/19 (from the past 24 hour(s))  CBC     Status: None   Collection Time: 02/09/19  1:36 PM  Result Value Ref Range   WBC 7.7 4.0 - 10.5 K/uL   RBC 4.84 4.22 - 5.81 MIL/uL   Hemoglobin 13.9 13.0 - 17.0 g/dL   HCT 43.9 39.0 - 52.0 %   MCV 90.7 80.0 - 100.0 fL   MCH 28.7 26.0 - 34.0 pg   MCHC 31.7 30.0 - 36.0 g/dL   RDW 13.3 11.5 - 15.5 %   Platelets 243 150 - 400 K/uL   nRBC 0.0 0.0 - 0.2 %  Basic metabolic panel     Status: Abnormal   Collection Time: 02/09/19  1:36 PM  Result Value Ref Range   Sodium 143 135 - 145 mmol/L   Potassium 3.2 (L) 3.5 - 5.1 mmol/L   Chloride 105 98 - 111 mmol/L   CO2 27 22 - 32 mmol/L   Glucose, Bld 139 (H) 70 - 99 mg/dL   BUN 37 (H) 8 - 23 mg/dL   Creatinine, Ser 2.72 (H) 0.61 - 1.24 mg/dL   Calcium 8.9 8.9 - 10.3 mg/dL   GFR calc non Af Amer 23 (L) >60 mL/min   GFR calc Af Amer 27 (L) >60 mL/min   Anion gap 11 5 - 15  Protime-INR     Status: None   Collection Time: 02/09/19  1:36 PM  Result Value Ref Range   Prothrombin Time 14.7 11.4 - 15.2 seconds   INR 1.2 0.8 - 1.2   No results found for this or any previous visit (from the past 240 hour(s)).  Renal Function: Recent Labs    02/09/19 1336  CREATININE 2.72*   Estimated Creatinine Clearance: 34.8  mL/min (A) (by C-G formula based on SCr of 2.72 mg/dL (H)).  Procedure: I inserted a 22  Fr 3 way hematuria catheter under sterile conditions.  I hand irrigated about 1 L of gross clot.  His urine remained quite red.  No further clot was able to be irrigated.  I placed more sterile water in his catheter balloon up to 50 cc total and placed him on traction.  I started CBI with subsequent clearing of urine.   Impression/Assessment:  Hematuria due to BPH with clot retention  Plan:  1) Admit for CBI and titrate as tolerated.  Remain on traction tonight.  Will check Hgb and Cr in AM.  Hgb currently 13.9.  Hold ASA.  Dutch Gray 02/09/2019, 4:41 PM  Pryor Curia MD

## 2019-02-09 NOTE — ED Notes (Signed)
Urologist at bedside.

## 2019-02-09 NOTE — ED Notes (Signed)
Pt yelling out, rolling around in bed.  When this RN entered room, pt was half way out of the bed.  This RN attempted to reposition pt in the bed, lift one bed rail for safety.  Pt began asking for rail to be lowered.  Pt continuing to writhe in bed.  Pt reporting that he needs to have bowel movement, can't sit still due to need for bathroom.  Pt assisted out of bed and was able to ambulate independently to bathroom.  While walking pt requesting foley catheter, pain medication.  This RN informed pt that provider must assess pt prior to interventions being performed.

## 2019-02-09 NOTE — ED Notes (Signed)
3 L bright red fluid emptied from foley bag. Will continue to monitor.

## 2019-02-09 NOTE — ED Provider Notes (Signed)
[place] DEPT Provider Note   CSN: [id number] Arrival date & time: 02/09/19  1228     History   Chief Complaint Chief Complaint  Patient presents with  . Hematuria  . Hypertension    HPI Ronyn Dziadosz is a 68 y.o. male.     HPI Patient presents to the emergency room for evaluation of abdominal pain and inability to urinate.  Patient has a history of urinary retention and gross hematuria.  Patient was in the emergency room on July 30.  Patient was treated and released.  He was also seen by Dr. Junious Silk, urology.  Patient states he followed up with his urologist and has been using a Foley catheter for the last month.  He had been instructed that he could take his catheter out and resume doing intermittent self-catheterization as he had been doing previously.  Patient took the catheter out yesterday.  Patient this morning had the urge to urinate but was unable to do so.  He started to notice some blood from the tip of his penis.  He attempted to self catheterize but was unable to do so.  Patient began having severe pain.  He was having diaphoresis and felt lightheaded.  While he was here in the ED the patient ended up self catheterizing himself.  He had at least approximately 600 to 700 cc of dark bloody urine.  Patient is feeling much better now after that. Past Medical History:  Diagnosis Date  . Anemia    normal Fe, nl B12, nl retic, nl EPO July '13  . Blood transfusion without reported diagnosis   . BPH (benign prostatic hyperplasia)   . Chronic back pain   . Chronic kidney disease    CKD III, obstructive nephropathy  . Diverticulosis   . Dysuria   . Elevated PSA, greater than or equal to 20 ng/ml June '13   PSA 107  . Hemorrhoids, internal, with bleeding, prolapse 09/19/2014  . Hyperlipidemia 05/29/2014  . Hypertension   . Obstructive uropathy 11/27/2015  . Renal insufficiency   . Tuberculosis    h/o PPD +  . UTI (urinary tract infection)   .  Vitamin D deficiency 09/13/2017    Patient Active Problem List   Diagnosis Date Noted  . Vitamin D deficiency 09/13/2017  . Urinary tract infection without hematuria 07/23/2017  . Low back pain 07/03/2017  . Dizziness 06/30/2017  . Acute gouty arthritis 12/01/2016  . Degenerative arthritis of knee, bilateral 11/18/2016  . Urinary retention due to benign prostatic hyperplasia 10/24/2016  . Mass of right side of neck 10/20/2016  . Gout 10/20/2016  . Knee pain, acute 10/12/2016  . Hypokalemia 05/28/2016  . Encounter for well adult exam with abnormal findings 11/27/2015  . Obstructive uropathy 11/27/2015  . Elevated PSA 11/27/2015  . Acute pyelonephritis 05/27/2015  . Generalized bloating 05/27/2015  . Constipation 05/27/2015  . Chest pain 05/27/2015  . AKI (acute kidney injury) ([place]) 05/27/2015  . Acute sinus infection 11/22/2014  . Cough 09/25/2014  . Hemorrhoids, internal, with bleeding, prolapse 09/19/2014  . 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 Hyperlipidemia 05/29/2014  . Lumbar radiculopathy 05/23/2014  . Lumbar stenosis 11/20/2013  . Abnormal glucose 11/06/2013  . Unspecified hereditary and idiopathic peripheral neuropathy 01/22/2013  . 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 CKD (chronic kidney disease) stage 3, GFR 30-59 ml/min (HCC) 11/24/2011  . Hematuria 11/24/2011  . Hydronephrosis 11/24/2011  . Anemia 11/24/2011  . BRADYCARDIA 02/05/2010  . TINEA PEDIS  01/29/2010  . 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 PERIPHERAL EDEMA 01/29/2010  . ABNORMAL ELECTROCARDIOGRAM 01/29/2010  . 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  . Osteoarthrosis, generalized, 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  . ONYCHOMYCOSIS, TOENAILS 10/02/2007  . BPH (benign prostatic hyperplasia) 10/02/2007  . Essential hypertension 03/06/2007    Past Surgical History:  Procedure Laterality Date  . CARPAL TUNNEL RELEASE Right   . COLONOSCOPY    . HEMORRHOID BANDING    . PROSTATE SURGERY    .  SPLENECTOMY          Home Medications    Prior to Admission medications   Medication Sig Start Date End Date Taking? Authorizing Provider  acetaminophen (TYLENOL) 500 MG tablet Take 1,000 mg by mouth every 6 (six) hours as needed (pain).    [provider]  allopurinol (ZYLOPRIM) 100 MG tablet Take 2 tablets (200 mg total) by mouth daily. Patient taking differently: Take 100 mg by mouth daily.  05/12/18   Biagio Borg, MD  aspirin EC 81 MG tablet Take 81 mg by mouth daily.    [provider]  atorvastatin (LIPITOR) 40 MG tablet TAKE 1 TABLET BY MOUTH ONCE DAILY Patient taking differently: Take 40 mg by mouth at bedtime.  01/10/18   Biagio Borg, MD  cholecalciferol (VITAMIN D3) 25 MCG (1000 UT) tablet Take 1,000 Units by mouth daily.    [provider]  colchicine 0.6 MG tablet Take 0.6 mg by mouth daily. 10/21/18   [provider]  finasteride (PROSCAR) 5 MG tablet TAKE 1 TABLET BY MOUTH ONCE DAILY Patient taking differently: Take 5 mg by mouth daily.  06/12/18   Biagio Borg, MD  FOLIC ACID PO Take 1 tablet by mouth daily.    [provider]  lidocaine (XYLOCAINE) 2 % jelly Place 1 application into the urethra as needed. Patient taking differently: Place 1 application into the urethra daily as needed (pain from disposable catheter).  04/21/18   Recardo Evangelist, PA-C  metoprolol tartrate (LOPRESSOR) 25 MG tablet Take 1 tablet by mouth twice daily Patient taking differently: Take 25 mg by mouth 2 (two) times daily. (BETA BLOCKER) 08/23/18   Biagio Borg, MD  Misc Natural Products (TART CHERRY ADVANCED PO) Take 1 tablet by mouth daily.    [provider]  POTASSIUM PO Take 1 tablet by mouth daily.    [provider]  tamsulosin (FLOMAX) 0.4 MG CAPS capsule Take 0.4 mg by mouth daily. 10/17/18   [provider]    Family History Family History  Problem Relation Age of Onset  . Diabetes Mother   . Stroke Brother    . Hearing loss Brother   . Colon cancer Neg Hx   . Rectal cancer Neg Hx   . Stomach cancer Neg Hx   . Esophageal cancer Neg Hx     Social History Social History   Tobacco Use  . Smoking status: Former Smoker    Types: Cigarettes  . Smokeless tobacco: Never Used  . Tobacco comment: Quit 1981  Substance Use Topics  . Alcohol use: No    Alcohol/week: 0.0 standard drinks    Comment: Quit 2004  . Drug use: No     Allergies   Patient has no known allergies.   Review of Systems Review of Systems  All other systems reviewed and are negative.    Physical Exam Updated Vital Signs BP (!) 143/95   Pulse 92   Temp 98 F (36.7 C) (Oral)  Resp 18   Ht 1.88 m (6\' 2" )   Wt 113.4 kg   SpO2 97%   BMI 32.10 kg/m   Physical Exam Vitals signs and nursing note reviewed.  Constitutional:      Appearance: He is well-developed. He is not diaphoretic.  HENT:     Head: Normocephalic and atraumatic.     Right Ear: External ear normal.     Left Ear: External ear normal.  Eyes:     General: No scleral icterus.       Right eye: No discharge.        Left eye: No discharge.     Conjunctiva/sclera: Conjunctivae normal.  Neck:     Musculoskeletal: Neck supple.     Trachea: No tracheal deviation.  Cardiovascular:     Rate and Rhythm: Normal rate and regular rhythm.  Pulmonary:     Effort: Pulmonary effort is normal. No respiratory distress.     Breath sounds: Normal breath sounds. No stridor. No wheezing or rales.  Abdominal:     General: Bowel sounds are normal. There is no distension.     Palpations: Abdomen is soft.     Tenderness: There is no abdominal tenderness. There is no guarding or rebound.  Musculoskeletal:        General: No tenderness.  Skin:    General: Skin is warm and dry.     Findings: No rash.  Neurological:     Mental Status: He is alert.     Cranial Nerves: No cranial nerve deficit (no facial droop, extraocular movements intact, no slurred speech).      Sensory: No sensory deficit.     Motor: No abnormal muscle tone or seizure activity.     Coordination: Coordination normal.      ED Treatments / Results  Labs (all labs ordered are listed, but only abnormal results are displayed) Labs Reviewed  BASIC METABOLIC PANEL - Abnormal; Notable for the following components:      Result Value   Potassium 3.2 (*)    Glucose, Bld 139 (*)    BUN 37 (*)    Creatinine, Ser 2.72 (*)    GFR calc non Af Amer 23 (*)    GFR calc Af Amer 27 (*)    All other components within normal limits  CBC  PROTIME-INR  URINALYSIS, ROUTINE W REFLEX MICROSCOPIC    EKG None  Radiology No results found.  Procedures Procedures (including critical care time)  Medications Ordered in ED Medications  morphine 4 MG/ML injection 4 mg (4 mg Intravenous Given 02/09/19 1535)     Initial Impression / Assessment and Plan / ED Course  I have reviewed the triage vital signs and the nursing notes.  Pertinent labs & imaging results that were available during my care of the patient were reviewed by me and considered in my medical decision making (see chart for details).  Clinical Course as of Feb 08 1725  Fri Feb 09, 2019  1554 RN staff were unable to get any outflow from the catheter or irrigate a catheter at all. DIscussed with Dr Alinda Money.  He will come evaluate the patient in the ED.      [id number] Labs reviewed.  Chronic renal insufficiency noted.  Stable CBC.   [JK]    Clinical Course User Index [JK] Dorie Rank, MD     Patient presented with recurrent urinary retention and hematuria.  Patient was able to self catheterize and get some urine out but  the staff was unable to insert a Foley catheter earlier irrigate his bladder.  Dr. Alinda Money urology evaluated the patient in the ED.  He was able to successfully insert a catheter but the patient continued to have gross hematuria even after irrigation.  He suspect he has ongoing hematuria and plans to bring him into the  hospital for continuous bladder irrigation  Final Clinical Impressions(s) / ED Diagnoses   Final diagnoses:  Gross hematuria  Urinary retention    ED Discharge Orders    None       Dorie Rank, MD 02/10/19 1207

## 2019-02-09 NOTE — ED Notes (Signed)
Pt provided with meal tray.

## 2019-02-09 NOTE — ED Notes (Signed)
ED TO INPATIENT HANDOFF REPORT  ED Nurse Name and Phone #: jon wled   S Name/Age/Gender Michel Harrow 68 y.o. male Room/Bed: WA22/WA22  Code Status   Code Status: Full Code  Home/SNF/Other Home Patient oriented to: self, place, time and situation Is this baseline? Yes   Triage Complete: Triage complete  Chief Complaint hematuria  Triage Note Pt BIBA from home.   Per EMS- Pt reports having foley removed yesterday, tried to use "disposable catheter today and the blood clogged it up." Pt reports bleeding started this AM, pt c/o abd being swollen and pain in abdomen and penis.  EMS reports pt is diaphoretic, hypertensive. Pt AOx4, ambulatory on scene.    Allergies No Known Allergies  Level of Care/Admitting Diagnosis ED Disposition    ED Disposition Condition Comment   Admit  Hospital Area: [facility] [[id number]]  Level of Care: Med-Surg [16]  Covid Evaluation: Asymptomatic Screening Protocol (No Symptoms)  Diagnosis: Hematuria [id number]  Admitting Physician: Raynelle Bring [[id number]]  Attending Physician: Raynelle Bring [[id number]]  Estimated length of stay: past midnight tomorrow  Certification:: I certify this patient will need inpatient services for at least 2 midnights  PT Class (Do Not Modify): Inpatient [101]  PT Acc Code (Do Not Modify): Private [1]       B Medical/Surgery History Past Medical History:  Diagnosis Date  . Anemia    normal Fe, nl B12, nl retic, nl EPO July '13  . Blood transfusion without reported diagnosis   . BPH (benign prostatic hyperplasia)   . Chronic back pain   . Chronic kidney disease    CKD III, obstructive nephropathy  . Diverticulosis   . Dysuria   . Elevated PSA, greater than or equal to 20 ng/ml June '13   PSA 107  . Hemorrhoids, internal, with bleeding, prolapse 09/19/2014  . Hyperlipidemia 05/29/2014  . Hypertension   . Obstructive uropathy 11/27/2015  . Renal insufficiency   . Tuberculosis    h/o PPD +  .  UTI (urinary tract infection)   . Vitamin D deficiency 09/13/2017   Past Surgical History:  Procedure Laterality Date  . CARPAL TUNNEL RELEASE Right   . COLONOSCOPY    . HEMORRHOID BANDING    . PROSTATE SURGERY    . SPLENECTOMY       A IV Location/Drains/Wounds Patient Lines/Drains/Airways Status   Active Line/Drains/Airways    Name:   Placement date:   Placement time:   Site:   Days:   Peripheral IV 02/09/19 Right Antecubital   02/09/19    1337    Antecubital   less than 1   Urethral Catheter Alinda Money, MD Coude 22 Fr.   02/09/19    -    Coude   less than 1          Intake/Output Last 24 hours  Intake/Output Summary (Last 24 hours) at 02/09/2019 2003 Last data filed at 02/09/2019 1932 Gross per 24 hour  Intake -  Output 1900 ml  Net -1900 ml    Labs/Imaging Results for orders placed or performed during the hospital encounter of 02/09/19 (from the past 48 hour(s))  CBC     Status: None   Collection Time: 02/09/19  1:36 PM  Result Value Ref Range   WBC 7.7 4.0 - 10.5 K/uL   RBC 4.84 4.22 - 5.81 MIL/uL   Hemoglobin 13.9 13.0 - 17.0 g/dL   HCT 43.9 39.0 - 52.0 %   MCV 90.7 80.0 - 100.0 fL  MCH 28.7 26.0 - 34.0 pg   MCHC 31.7 30.0 - 36.0 g/dL   RDW 13.3 11.5 - 15.5 %   Platelets 243 150 - 400 K/uL   nRBC 0.0 0.0 - 0.2 %    Comment: Performed at [facility], [place] [address], [place], [place] [id number]  Basic metabolic panel     Status: Abnormal   Collection Time: 02/09/19  1:36 PM  Result Value Ref Range   Sodium 143 135 - 145 mmol/L   Potassium 3.2 (L) 3.5 - 5.1 mmol/L   Chloride 105 98 - 111 mmol/L   CO2 27 22 - 32 mmol/L   Glucose, Bld 139 (H) 70 - 99 mg/dL   BUN 37 (H) 8 - 23 mg/dL   Creatinine, Ser 2.72 (H) 0.61 - 1.24 mg/dL   Calcium 8.9 8.9 - 10.3 mg/dL   GFR calc non Af Amer 23 (L) >60 mL/min   GFR calc Af Amer 27 (L) >60 mL/min   Anion gap 11 5 - 15    Comment: Performed at [facility] [address]  [place], [place] [address]  Protime-INR     Status: None   Collection Time: 02/09/19  1:36 PM  Result Value Ref Range   Prothrombin Time 14.7 11.4 - 15.2 seconds   INR 1.2 0.8 - 1.2    Comment: (NOTE) INR goal varies based on device and disease states. Performed at [facility], [place] [address], [place], [place] [address]    No results found.  Pending Labs [employer] (From admission, onward)    Start     Ordered   02/09/19 1653  SARS CORONAVIRUS 2 (TAT 6-24 HRS) Nasopharyngeal Nasopharyngeal Swab  (Asymptomatic/Tier 2)  Once,   STAT    Question Answer Comment  Is this test for diagnosis or screening Screening   Symptomatic for COVID-19 as defined by CDC No   Hospitalized for COVID-19 No   Admitted to ICU for COVID-19 No   Previously tested for COVID-19 No   Resident in a congregate (group) care setting No   Employed in healthcare setting No      02/09/19 1653   02/09/19 1323  Urinalysis, Routine w reflex microscopic  Once,   STAT     02/09/19 1323   Signed and Held  HIV antibody (Routine Testing)  Once,   R     Signed and Held   Signed and Held  Basic metabolic panel  Tomorrow morning,   R     Signed and Held   Signed and Held  Hemoglobin and hematocrit, blood  Tomorrow morning,   R     Signed and Held          Vitals/Pain Today's Vitals   02/09/19 1539 02/09/19 1730 02/09/19 1747 02/09/19 1933  BP: (!) 143/95  (!) 154/105 (!) 183/121  Pulse: 92  87 98  Resp: 18  18 18   Temp:      TempSrc:      SpO2: 97%  97% 97%  Weight:      Height:      PainSc:  4       Isolation Precautions No active isolations  Medications Medications  0.9 %  sodium chloride infusion (75 mL/hr Intravenous New Bag/Given 02/09/19 1955)  HYDROcodone-acetaminophen (NORCO/VICODIN) 5-325 MG per tablet 1-2 tablet (has no administration in time range)  morphine 4 MG/ML injection 4 mg (4 mg Intravenous Given 02/09/19 1535)    Mobility walks  Low fall risk   Focused  Assessments     R Recommendations: See Admitting Provider Note  Report given to:   Additional Notes: CBI going

## 2019-02-09 NOTE — ED Notes (Signed)
RN/Writer attempted to insert 10 F urinary cath, no urinary output was noted and cath was clogged by blood clot, pt felt discomfort with initiating of balloon inflation, Cath removed, Cari, RN made aware that pt would need a coude 3 way. Pt tolerated well, verbalized understanding.

## 2019-02-09 NOTE — ED Triage Notes (Signed)
Pt BIBA from home.   Per EMS- Pt reports having foley removed yesterday, tried to use "disposable catheter today and the blood clogged it up." Pt reports bleeding started this AM, pt c/o abd being swollen and pain in abdomen and penis.  EMS reports pt is diaphoretic, hypertensive. Pt AOx4, ambulatory on scene.

## 2019-02-10 LAB — BASIC METABOLIC PANEL
Anion gap: 7 (ref 5–15)
BUN: 39 mg/dL — ABNORMAL HIGH (ref 8–23)
CO2: 27 mmol/L (ref 22–32)
Calcium: 8.2 mg/dL — ABNORMAL LOW (ref 8.9–10.3)
Chloride: 107 mmol/L (ref 98–111)
Creatinine, Ser: 2.58 mg/dL — ABNORMAL HIGH (ref 0.61–1.24)
GFR calc Af Amer: 28 mL/min — ABNORMAL LOW (ref >60)
GFR calc non Af Amer: 24 mL/min — ABNORMAL LOW (ref >60)
Glucose, Bld: 99 mg/dL (ref 70–99)
Potassium: 3.5 mmol/L (ref 3.5–5.1)
Sodium: 141 mmol/L (ref 135–145)

## 2019-02-10 LAB — HEMOGLOBIN AND HEMATOCRIT, BLOOD
HCT: 35.4 % — ABNORMAL LOW (ref 39.0–52.0)
Hemoglobin: 11.1 g/dL — ABNORMAL LOW (ref 13.0–17.0)

## 2019-02-10 LAB — SARS CORONAVIRUS 2 (TAT 6-24 HRS): SARS Coronavirus 2: NEGATIVE

## 2019-02-10 NOTE — Progress Notes (Signed)
Patient ID: [name], male   DOB: 07/26/1950, 68 y.o.   MRN: [id number]    Subjective: Pt feeling well.  Did not require hand irrigation overnight.  Objective: Vital signs in last 24 hours: Temp:  [97.8 F (36.6 C)-98.7 F (37.1 C)] 97.8 F (36.6 C) (09/05 0626) Pulse Rate:  [55-102] 55 (09/05 0626) Resp:  [18-20] 18 (09/05 0626) BP: (134-183)/(82-127) 134/82 (09/05 0626) SpO2:  [96 %-99 %] 97 % (09/05 0626) Weight:  [113.4 kg] 113.4 kg (09/04 1243)  Intake/Output from previous day: 09/04 0701 - 09/05 0700 In: -  Out: [id number] [Urine:10375] Intake/Output this shift: Total I/O In: -  Out: 1600 [Urine:1600]  Physical Exam:  General: Alert and oriented Abdomen: Soft, ND GU: Urine clear on mild to moderate CBI Ext: NT, No erythema  Lab Results: Recent Labs    02/09/19 1336 02/10/19 0530  HGB 13.9 11.1*  HCT 43.9 35.4*   BMET Recent Labs    02/09/19 1336 02/10/19 0530  NA 143 141  K 3.2* 3.5  CL 105 107  CO2 27 27  GLUCOSE 139* 99  BUN 37* 39*  CREATININE 2.72* 2.58*  CALCIUM 8.9 8.2*     Studies/Results: No results found.  Assessment/Plan: 1) Hematuria with clot retention: Continue to titrate CBI off.  Monitor Hgb but no significant active bleeding likely now.  If CBI able to be titrated off and Hgb stable tomorrow, will be OK to d/c home with catheter.  2) CKD: Renal function and similar to baseline.   LOS: 1 day   [name] 02/10/2019, 8:29 AM

## 2019-02-11 ENCOUNTER — Inpatient Hospital Stay (HOSPITAL_COMMUNITY)
Admission: EM | Admit: 2019-02-11 | Discharge: 2019-02-18 | DRG: 699 | Disposition: A | Discharge status: Home / Self Care | Payer: Medicare Other | Attending: Urology | Admitting: Urology

## 2019-02-11 ENCOUNTER — Other Ambulatory Visit: Payer: Self-pay

## 2019-02-11 ENCOUNTER — Encounter (HOSPITAL_COMMUNITY): Payer: Self-pay | Admitting: *Deleted

## 2019-02-11 DIAGNOSIS — N183 Chronic kidney disease, stage 3 (moderate): Secondary | ICD-10-CM | POA: Diagnosis present

## 2019-02-11 DIAGNOSIS — R339 Retention of urine, unspecified: Secondary | ICD-10-CM

## 2019-02-11 DIAGNOSIS — Z9081 Acquired absence of spleen: Secondary | ICD-10-CM | POA: Diagnosis not present

## 2019-02-11 DIAGNOSIS — N390 Urinary tract infection, site not specified: Secondary | ICD-10-CM | POA: Diagnosis present

## 2019-02-11 DIAGNOSIS — M109 Gout, unspecified: Secondary | ICD-10-CM | POA: Diagnosis not present

## 2019-02-11 DIAGNOSIS — N189 Chronic kidney disease, unspecified: Secondary | ICD-10-CM | POA: Diagnosis not present

## 2019-02-11 DIAGNOSIS — I129 Hypertensive chronic kidney disease with stage 1 through stage 4 chronic kidney disease, or unspecified chronic kidney disease: Secondary | ICD-10-CM | POA: Diagnosis present

## 2019-02-11 DIAGNOSIS — Y846 Urinary catheterization as the cause of abnormal reaction of the patient, or of later complication, without mention of misadventure at the time of the procedure: Secondary | ICD-10-CM | POA: Diagnosis present

## 2019-02-11 DIAGNOSIS — N3289 Other specified disorders of bladder: Secondary | ICD-10-CM | POA: Diagnosis present

## 2019-02-11 DIAGNOSIS — N320 Bladder-neck obstruction: Secondary | ICD-10-CM | POA: Diagnosis present

## 2019-02-11 DIAGNOSIS — D649 Anemia, unspecified: Secondary | ICD-10-CM | POA: Diagnosis not present

## 2019-02-11 DIAGNOSIS — D62 Acute posthemorrhagic anemia: Secondary | ICD-10-CM | POA: Diagnosis present

## 2019-02-11 DIAGNOSIS — N029 Recurrent and persistent hematuria with unspecified morphologic changes: Secondary | ICD-10-CM | POA: Diagnosis not present

## 2019-02-11 DIAGNOSIS — M549 Dorsalgia, unspecified: Secondary | ICD-10-CM | POA: Diagnosis not present

## 2019-02-11 DIAGNOSIS — M25461 Effusion, right knee: Secondary | ICD-10-CM | POA: Diagnosis present

## 2019-02-11 DIAGNOSIS — N401 Enlarged prostate with lower urinary tract symptoms: Secondary | ICD-10-CM | POA: Diagnosis present

## 2019-02-11 DIAGNOSIS — E559 Vitamin D deficiency, unspecified: Secondary | ICD-10-CM | POA: Diagnosis not present

## 2019-02-11 DIAGNOSIS — T83098A Other mechanical complication of other indwelling urethral catheter, initial encounter: Secondary | ICD-10-CM | POA: Diagnosis present

## 2019-02-11 DIAGNOSIS — Z87440 Personal history of urinary (tract) infections: Secondary | ICD-10-CM | POA: Diagnosis not present

## 2019-02-11 DIAGNOSIS — Z87891 Personal history of nicotine dependence: Secondary | ICD-10-CM | POA: Diagnosis not present

## 2019-02-11 DIAGNOSIS — R338 Other retention of urine: Secondary | ICD-10-CM | POA: Diagnosis present

## 2019-02-11 DIAGNOSIS — Z79899 Other long term (current) drug therapy: Secondary | ICD-10-CM

## 2019-02-11 DIAGNOSIS — E785 Hyperlipidemia, unspecified: Secondary | ICD-10-CM | POA: Diagnosis not present

## 2019-02-11 DIAGNOSIS — N138 Other obstructive and reflux uropathy: Secondary | ICD-10-CM | POA: Diagnosis present

## 2019-02-11 LAB — URINALYSIS, ROUTINE W REFLEX MICROSCOPIC
Bilirubin Urine: NEGATIVE
Glucose, UA: NEGATIVE mg/dL
Ketones, ur: NEGATIVE mg/dL
Nitrite: POSITIVE — AB
Protein, ur: 100 mg/dL — AB
Specific Gravity, Urine: 1.011 (ref 1.005–1.030)
pH: 6 (ref 5.0–8.0)

## 2019-02-11 LAB — BASIC METABOLIC PANEL
Anion gap: 11 (ref 5–15)
BUN: 39 mg/dL — ABNORMAL HIGH (ref 8–23)
CO2: 27 mmol/L (ref 22–32)
Calcium: 8.4 mg/dL — ABNORMAL LOW (ref 8.9–10.3)
Chloride: 100 mmol/L (ref 98–111)
Creatinine, Ser: 2.84 mg/dL — ABNORMAL HIGH (ref 0.61–1.24)
GFR calc Af Amer: 25 mL/min — ABNORMAL LOW (ref >60)
GFR calc non Af Amer: 22 mL/min — ABNORMAL LOW (ref >60)
Glucose, Bld: 198 mg/dL — ABNORMAL HIGH (ref 70–99)
Potassium: 3.7 mmol/L (ref 3.5–5.1)
Sodium: 138 mmol/L (ref 135–145)

## 2019-02-11 LAB — CBC
HCT: 33.3 % — ABNORMAL LOW (ref 39.0–52.0)
Hemoglobin: 10.5 g/dL — ABNORMAL LOW (ref 13.0–17.0)
MCH: 29.1 pg (ref 26.0–34.0)
MCHC: 31.5 g/dL (ref 30.0–36.0)
MCV: 92.2 fL (ref 80.0–100.0)
Platelets: 178 10*3/uL (ref 150–400)
RBC: 3.61 MIL/uL — ABNORMAL LOW (ref 4.22–5.81)
RDW: 13.5 % (ref 11.5–15.5)
WBC: 11.7 10*3/uL — ABNORMAL HIGH (ref 4.0–10.5)
nRBC: 0 % (ref 0.0–0.2)

## 2019-02-11 LAB — HIV ANTIBODY (ROUTINE TESTING W REFLEX): HIV Screen 4th Generation wRfx: NONREACTIVE

## 2019-02-11 LAB — HEMOGLOBIN AND HEMATOCRIT, BLOOD
HCT: 32.7 % — ABNORMAL LOW (ref 39.0–52.0)
Hemoglobin: 10.3 g/dL — ABNORMAL LOW (ref 13.0–17.0)

## 2019-02-11 MED ORDER — SODIUM CHLORIDE 0.9 % IV SOLN
1.0000 g | Freq: Every day | INTRAVENOUS | Status: DC
  Administered 2019-02-11 – 2019-02-15 (×5): 1 g via INTRAVENOUS
  Filled 2019-02-11 (×2): qty 1
  Filled 2019-02-11: qty 10
  Filled 2019-02-11 (×3): qty 1

## 2019-02-11 MED ORDER — BELLADONNA ALKALOIDS-OPIUM 16.2-60 MG RE SUPP
1.0000 | Freq: Four times a day (QID) | RECTAL | Status: DC | PRN
  Administered 2019-02-12 – 2019-02-13 (×2): 1 via RECTAL
  Filled 2019-02-11 (×3): qty 1

## 2019-02-11 MED ORDER — VITAMIN D 25 MCG (1000 UNIT) PO TABS
1000.0000 [IU] | ORAL_TABLET | Freq: Every day | ORAL | Status: DC
  Administered 2019-02-12 – 2019-02-18 (×7): 1000 [IU] via ORAL
  Filled 2019-02-11 (×7): qty 1

## 2019-02-11 MED ORDER — OXYCODONE HCL 5 MG PO TABS
5.0000 mg | ORAL_TABLET | ORAL | Status: DC | PRN
  Administered 2019-02-12 – 2019-02-17 (×12): 5 mg via ORAL
  Filled 2019-02-11 (×12): qty 1

## 2019-02-11 MED ORDER — ATORVASTATIN CALCIUM 40 MG PO TABS
40.0000 mg | ORAL_TABLET | Freq: Every day | ORAL | Status: DC
  Administered 2019-02-11 – 2019-02-17 (×7): 40 mg via ORAL
  Filled 2019-02-11 (×7): qty 1

## 2019-02-11 MED ORDER — POTASSIUM CHLORIDE IN NACL 20-0.45 MEQ/L-% IV SOLN
INTRAVENOUS | Status: DC
  Administered 2019-02-11: via INTRAVENOUS
  Filled 2019-02-11 (×2): qty 1000

## 2019-02-11 MED ORDER — ACETAMINOPHEN 325 MG PO TABS
650.0000 mg | ORAL_TABLET | ORAL | Status: DC | PRN
  Administered 2019-02-13 – 2019-02-14 (×3): 650 mg via ORAL
  Filled 2019-02-11 (×3): qty 2

## 2019-02-11 MED ORDER — ONDANSETRON HCL 4 MG/2ML IJ SOLN: 4.0000 mg | INTRAMUSCULAR | Status: DC | PRN

## 2019-02-11 MED ORDER — SODIUM CHLORIDE 0.9 % IR SOLN
3000.0000 mL | Status: DC
  Administered 2019-02-11 – 2019-02-14 (×28): 3000 mL

## 2019-02-11 MED ORDER — HYDROMORPHONE HCL 1 MG/ML IJ SOLN
1.0000 mg | Freq: Once | INTRAMUSCULAR | Status: AC
  Administered 2019-02-11: 1 mg via INTRAVENOUS
  Filled 2019-02-11: qty 1

## 2019-02-11 MED ORDER — DOCUSATE SODIUM 100 MG PO CAPS
100.0000 mg | ORAL_CAPSULE | Freq: Two times a day (BID) | ORAL | Status: DC
  Administered 2019-02-11 – 2019-02-18 (×14): 100 mg via ORAL
  Filled 2019-02-11 (×14): qty 1

## 2019-02-11 MED ORDER — ZOLPIDEM TARTRATE 5 MG PO TABS
5.0000 mg | ORAL_TABLET | Freq: Every evening | ORAL | Status: DC | PRN
  Administered 2019-02-16: 5 mg via ORAL
  Filled 2019-02-11: qty 1

## 2019-02-11 MED ORDER — METOPROLOL TARTRATE 25 MG PO TABS
25.0000 mg | ORAL_TABLET | Freq: Two times a day (BID) | ORAL | Status: DC
  Administered 2019-02-12 – 2019-02-18 (×13): 25 mg via ORAL
  Filled 2019-02-11 (×13): qty 1

## 2019-02-11 MED ORDER — HYDROMORPHONE HCL 1 MG/ML IJ SOLN
0.5000 mg | INTRAMUSCULAR | Status: DC | PRN
  Administered 2019-02-13 – 2019-02-14 (×5): 1 mg via INTRAVENOUS
  Filled 2019-02-11 (×5): qty 1

## 2019-02-11 MED ORDER — SENNOSIDES-DOCUSATE SODIUM 8.6-50 MG PO TABS: 1.0000 | ORAL_TABLET | Freq: Every evening | ORAL | Status: DC | PRN

## 2019-02-11 MED ORDER — BISACODYL 10 MG RE SUPP: 10.0000 mg | Freq: Every day | RECTAL | Status: DC | PRN

## 2019-02-11 MED ORDER — ALLOPURINOL 100 MG PO TABS
200.0000 mg | ORAL_TABLET | Freq: Every day | ORAL | Status: DC
  Administered 2019-02-12 – 2019-02-18 (×7): 200 mg via ORAL
  Filled 2019-02-11 (×7): qty 2

## 2019-02-11 MED ORDER — FINASTERIDE 5 MG PO TABS
5.0000 mg | ORAL_TABLET | Freq: Every day | ORAL | Status: DC
  Administered 2019-02-12 – 2019-02-18 (×7): 5 mg via ORAL
  Filled 2019-02-11 (×7): qty 1

## 2019-02-11 MED ORDER — COLCHICINE 0.6 MG PO TABS
0.6000 mg | ORAL_TABLET | Freq: Every day | ORAL | Status: DC | PRN
  Administered 2019-02-13 – 2019-02-14 (×2): 0.6 mg via ORAL
  Filled 2019-02-11 (×2): qty 1

## 2019-02-11 MED ORDER — SODIUM CHLORIDE 0.9 % IR SOLN
1000.0000 mL | Status: DC
  Administered 2019-02-15 – 2019-02-16 (×2): 1000 mL

## 2019-02-11 MED ORDER — HYOSCYAMINE SULFATE SL 0.125 MG SL SUBL: 0.1250 mg | SUBLINGUAL_TABLET | Freq: Four times a day (QID) | SUBLINGUAL | 2 refills | Status: DC | PRN

## 2019-02-11 MED ORDER — FLEET ENEMA 7-19 GM/118ML RE ENEM: 1.0000 | ENEMA | Freq: Once | RECTAL | Status: DC | PRN

## 2019-02-11 NOTE — ED Notes (Signed)
ED TO INPATIENT HANDOFF REPORT  ED Nurse Name and Phone #: Judye Bos Name/Age/Gender [name] 68 y.o. male Room/Bed: WA11/WA11  Code Status   Code Status: Prior  Home/SNF/Other Home Patient oriented to: self, place, time and situation Is this baseline? Yes   Triage Complete: Triage complete  Chief Complaint Clogged catheter, urinary difficulties, no relief, bloody, abd pain  Triage Note Pt has blood in leg bag, states he believes his foley catheter is clogged. Pt discharged from hospital this morning, hospitalized for hematuria and had foley placed.   Allergies No Known Allergies  Level of Care/Admitting Diagnosis ED Disposition    ED Disposition Condition Comment   Admit  Hospital Area: [place] [[id number]]  Level of Care: Med-Surg [16]  Covid Evaluation: Confirmed COVID Negative  Diagnosis: Clot retention of urine [id number]  Admitting Physician: Irine Seal [[id number]]  Attending Physician: Irine Seal [id number]  PT Class (Do Not Modify): Observation [104]  PT Acc Code (Do Not Modify): Observation [10022]       B Medical/Surgery History Past Medical History:  Diagnosis Date  . Anemia    normal Fe, nl B12, nl retic, nl EPO July '13  . Blood transfusion without reported diagnosis   . BPH (benign prostatic hyperplasia)   . Chronic back pain   . Chronic kidney disease    CKD III, obstructive nephropathy  . Diverticulosis   . Dysuria   . Elevated PSA, greater than or equal to 20 ng/ml June '13   PSA 107  . Hemorrhoids, internal, with bleeding, prolapse 09/19/2014  . Hyperlipidemia 05/29/2014  . Hypertension   . Obstructive uropathy 11/27/2015  . Renal insufficiency   . Tuberculosis    h/o PPD +  . UTI (urinary tract infection)   . Vitamin D deficiency 09/13/2017   Past Surgical History:  Procedure Laterality Date  . CARPAL TUNNEL RELEASE Right   . COLONOSCOPY    . HEMORRHOID BANDING    . PROSTATE SURGERY    . SPLENECTOMY       A IV  Location/Drains/Wounds Patient Lines/Drains/Airways Status   Active Line/Drains/Airways    Name:   Placement date:   Placement time:   Site:   Days:   Peripheral IV 02/11/19 Left Antecubital   02/11/19    2145    Antecubital   less than 1   Urethral Catheter Alinda Money, MD Coude 22 Fr.   02/09/19    -    Coude   2          Intake/Output Last 24 hours No intake or output data in the 24 hours ending 02/11/19 2219  Labs/Imaging Results for orders placed or performed during the hospital encounter of 02/11/19 (from the past 48 hour(s))  CBC     Status: Abnormal   Collection Time: 02/11/19  8:01 PM  Result Value Ref Range   WBC 11.7 (H) 4.0 - 10.5 K/uL   RBC 3.61 (L) 4.22 - 5.81 MIL/uL   Hemoglobin 10.5 (L) 13.0 - 17.0 g/dL   HCT 33.3 (L) 39.0 - 52.0 %   MCV 92.2 80.0 - 100.0 fL   MCH 29.1 26.0 - 34.0 pg   MCHC 31.5 30.0 - 36.0 g/dL   RDW 13.5 11.5 - 15.5 %   Platelets 178 150 - 400 K/uL   nRBC 0.0 0.0 - 0.2 %    Comment: Performed at [facility], [place] [address] [place], [place] [id number]  Basic metabolic panel  Status: Abnormal   Collection Time: 02/11/19  8:01 PM  Result Value Ref Range   Sodium 138 135 - 145 mmol/L   Potassium 3.7 3.5 - 5.1 mmol/L   Chloride 100 98 - 111 mmol/L   CO2 27 22 - 32 mmol/L   Glucose, Bld 198 (H) 70 - 99 mg/dL   BUN 39 (H) 8 - 23 mg/dL   Creatinine, Ser 2.84 (H) 0.61 - 1.24 mg/dL   Calcium 8.4 (L) 8.9 - 10.3 mg/dL   GFR calc non Af Amer 22 (L) >60 mL/min   GFR calc Af Amer 25 (L) >60 mL/min   Anion gap 11 5 - 15    Comment: Performed at [facility], [place] [address], [place], [place] [address]   No results found.  Pending Labs [employer] (From admission, onward)    Start     Ordered   Signed and Held  CBC  Tomorrow morning,   R     Signed and Held   Signed and Occupational hygienist morning,   R     Signed and Held   Signed and Held  Type and screen [place]   Once,   R    Comments: [place]    Signed and Held   Signed and Held  Hemoglobin and hematocrit, blood  Once,   R     Signed and Held   Signed and Held  Hemoglobin and hematocrit, blood  Once,   R    Comments: Call if Hgb < 8.0    Signed and Held          Vitals/Pain Today's Vitals   02/11/19 1831 02/11/19 1837 02/11/19 2130  BP: (!) 162/82  116/81  Pulse: (!) 116  96  Resp: 18    SpO2: 98%  94%  PainSc:  10-Worst pain ever     Isolation Precautions No active isolations  Medications Medications  sodium chloride irrigation 0.9 % 1,000 mL (has no administration in time range)  HYDROmorphone (DILAUDID) injection 1 mg (1 mg Intravenous Given 02/11/19 2002)    Mobility walks     Focused Assessments N/A   R Recommendations: See Admitting Provider Note  Report given to:   Additional Notes: NA

## 2019-02-11 NOTE — H&P (View-Only) (Signed)
Subjective: CC: suprapubic pain.  Hx:  [name] was discharged this morning with a foley and clear urine after being admitted for clot retention over the weekend.  After [name] arrived home [name] began to have hematuria again and eventually developed clot obstruction of the catheter.  [name] returned to the ER and the foley was irrigated without success and then exchanged but irrigation still failed so I was asked to see the patient.  [name] is in acute distress with retention and bladder pain but no other complaints.  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  [name] is on finasteride and is scheduled for a simple prostatectomy by Dr. Alyson Ingles in October.   ROS:  Review of Systems  Genitourinary: Positive for hematuria and urgency.  All other systems reviewed and are negative.   No Known Allergies  Past Medical History:  Diagnosis Date  . Anemia    normal Fe, nl B12, nl retic, nl EPO July '13  . Blood transfusion without reported diagnosis   . BPH (benign prostatic hyperplasia)   . Chronic back pain   . Chronic kidney disease    CKD III, obstructive nephropathy  . Diverticulosis   . Dysuria   . Elevated PSA, greater than or equal to 20 ng/ml June '13   PSA 107  . Hemorrhoids, internal, with bleeding, prolapse 09/19/2014  . Hyperlipidemia 05/29/2014  . Hypertension   . Obstructive uropathy 11/27/2015  . Renal insufficiency   . Tuberculosis    h/o PPD +  . UTI (urinary tract infection)   . Vitamin D deficiency 09/13/2017    Past Surgical History:  Procedure Laterality Date  . CARPAL TUNNEL RELEASE Right   . COLONOSCOPY    . HEMORRHOID BANDING    . PROSTATE SURGERY    . SPLENECTOMY      Social History   Socioeconomic History  . Marital status: Married    Spouse name: Not on file  . Number of children: 2  . Years of education: 46  . Highest education level: Not on file  Occupational History  . 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 Financial resource strain: Not on file  . Food insecurity    Worry: Not on file    Inability: Not on file  . Transportation needs    Medical: Not on file    Non-medical: Not on file  Tobacco Use  . Smoking status: Former Smoker    Types: Cigarettes  . Smokeless tobacco: Never Used  . Tobacco comment: Quit 1981  Substance and Sexual Activity  . Alcohol use: No    Alcohol/week: 0.0 standard drinks    Comment: Quit 2004  . Drug use: No  . Sexual activity: Never  Lifestyle  . Physical activity    Days per week: Not on file    Minutes per session: Not on file  . Stress: Not on file  Relationships  . Social Herbalist on phone: Not on file    Gets together: Not on file    Attends religious service: Not on file    Active member of club or organization: Not on file    Attends meetings of clubs or organizations: Not on file    Relationship status: Not on file  . Intimate partner violence    Fear of current or ex partner: Not on file    Emotionally abused: Not on file  Physically abused: Not on file    Forced sexual activity: Not on file  Other Topics Concern  . Not on file  Social History Narrative   Native of [place], raised poor farming community. . To [place] '78 - finished College, all but Arts administrator. Married - wife in [place], blocked from [place] to [place]. 1 son '90; 1 dtr '93. 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 Resources depleted.   Right handed.   Caffeine Hot daily one daily.    Family History  Problem Relation Age of Onset  . Diabetes Mother   . Stroke Brother   . Hearing loss Brother   . Colon cancer Neg Hx   . Rectal cancer Neg Hx   . Stomach cancer Neg Hx   . Esophageal cancer Neg Hx     Anti-infectives: Anti-infectives (From admission, onward)   None      Current Facility-Administered Medications  Medication Dose Route Frequency Provider Last Rate Last Dose  . sodium chloride irrigation 0.9  % 1,000 mL  1,000 mL Irrigation Continuous Dorie Rank, MD       Current Outpatient Medications  Medication Sig Dispense Refill  . acetaminophen (TYLENOL) 500 MG tablet Take 1,000 mg by mouth every 6 (six) hours as needed (pain).    Marland Kitchen allopurinol (ZYLOPRIM) 100 MG tablet Take 2 tablets (200 mg total) by mouth daily. (Patient taking differently: Take 100 mg by mouth daily. ) 180 tablet 3  . atorvastatin (LIPITOR) 40 MG tablet TAKE 1 TABLET BY MOUTH ONCE DAILY (Patient taking differently: Take 40 mg by mouth at bedtime. ) 90 tablet 3  . cholecalciferol (VITAMIN D3) 25 MCG (1000 UT) tablet Take 1,000 Units by mouth daily.    . colchicine 0.6 MG tablet Take 0.6 mg by mouth daily as needed (gout).     . finasteride (PROSCAR) 5 MG tablet TAKE 1 TABLET BY MOUTH ONCE DAILY (Patient taking differently: Take 5 mg by mouth daily. ) 90 tablet 0  . FOLIC ACID PO Take 1 tablet by mouth daily.    Marland Kitchen Hyoscyamine Sulfate SL (LEVSIN/SL) 0.125 MG SUBL Place 0.125 mg under the tongue every 6 (six) hours as needed (bladder spasm). 30 tablet 2  . lidocaine (XYLOCAINE) 2 % jelly Place 1 application into the urethra as needed. (Patient taking differently: Place 1 application into the urethra daily as needed (pain from disposable catheter). ) 30 mL 0  . metoprolol tartrate (LOPRESSOR) 25 MG tablet Take 1 tablet by mouth twice daily (Patient taking differently: Take 25 mg by mouth 2 (two) times daily. (BETA BLOCKER)) 180 tablet 0  . Misc Natural Products (TART CHERRY ADVANCED PO) Take 1 tablet by mouth daily.    Marland Kitchen POTASSIUM PO Take 1 tablet by mouth daily.    . tamsulosin (FLOMAX) 0.4 MG CAPS capsule Take 0.4 mg by mouth daily.       Objective: Vital signs in last 24 hours: Temp:  [99.1 F (37.3 C)] 99.1 F (37.3 C) (09/06 0653) Pulse Rate:  [68-116] 116 (09/06 1831) Resp:  [17-18] 18 (09/06 1831) BP: (154-162)/(82-98) 162/82 (09/06 1831) SpO2:  [94 %-98 %] 98 % (09/06 1831)  Intake/Output from previous day: No  intake/output data recorded. Intake/Output this shift: No intake/output data recorded.   Physical Exam Constitutional:      Appearance: Normal appearance. [name] is ill-appearing.  HENT:     Head: Normocephalic and atraumatic.  Neck:     Musculoskeletal: Normal range of motion and  neck supple.  Cardiovascular:     Rate and Rhythm: Regular rhythm. Tachycardia present.  Pulmonary:     Effort: Pulmonary effort is normal. No respiratory distress.  Abdominal:     Palpations: Abdomen is soft.     Tenderness: Tenderness: in SP area.     Comments: Well healed lower midline scar  Genitourinary:    Comments: Foley at the meatus with bloody urine.  Otherwise normal genitalia.  Musculoskeletal:        General: Swelling and tenderness (in left knee from gout) present.  Skin:    General: Skin is warm and dry.  Neurological:     General: No focal deficit present.     Mental Status: [name] is alert and oriented to person, place, and time.  Psychiatric:        Behavior: Behavior normal.        Thought Content: Thought content normal.     Lab Results:  Recent Labs    02/09/19 1336  02/11/19 0624 02/11/19 2001  WBC 7.7  --   --  11.7*  HGB 13.9   < > 10.3* 10.5*  HCT 43.9   < > 32.7* 33.3*  PLT 243  --   --  178   < > = values in this interval not displayed.   BMET Recent Labs    02/10/19 0530 02/11/19 2001  NA 141 138  K 3.5 3.7  CL 107 100  CO2 27 27  GLUCOSE 99 198*  BUN 39* 39*  CREATININE 2.58* 2.84*  CALCIUM 8.2* 8.4*   PT/INR Recent Labs    02/09/19 1336  LABPROT 14.7  INR 1.2   ABG No results for input(s): PHART, HCO3 in the last 72 hours.  Invalid input(s): PCO2, PO2  Studies/Results: No results found.   His catheter was hand irrigated with return of several hundred ml of fresh clot.  The urine cleared partially and remains pink on CBI.     30 minutes of critical care time at bedside during irrigation.    Assessment: BPH with recurrent hematuria and  clot retention.    His Hgb is stable but I will follow closely and keep him NPO p MN.  If the bleeding persists, I will consider going to the OR for cystoscopy and fulguration.  I will type and screen as I suspect his Hgb will drift down.   [name] will be kept on CBI.     Rocephin ordered for empiric coverage.  [name] has a mild leukocytosis and a very low grade fever.  CKD with Cr up to 2.84.   [name] was Covid negative on 02/09/19.   CC: Dr. Nicolette Bang.      Irine Seal 02/11/2019 [phone]

## 2019-02-11 NOTE — H&P (Signed)
Subjective: CC: suprapubic pain.  Hx:  Mr. Breining was discharged this morning with a foley and clear urine after being admitted for clot retention over the weekend.  After he arrived home he began to have hematuria again and eventually developed clot obstruction of the catheter.  He returned to the ER and the foley was irrigated without success and then exchanged but irrigation still failed so I was asked to see the patient.  He is in acute distress with retention and bladder pain but no other complaints.  He has BPH with BOO and a 375ml prostate.  He is on finasteride and is scheduled for a simple prostatectomy by Dr. Alyson Ingles in October.   ROS:  Review of Systems  Genitourinary: Positive for hematuria and urgency.  All other systems reviewed and are negative.   No Known Allergies  Past Medical History:  Diagnosis Date  . Anemia    normal Fe, nl B12, nl retic, nl EPO July '13  . Blood transfusion without reported diagnosis   . BPH (benign prostatic hyperplasia)   . Chronic back pain   . Chronic kidney disease    CKD III, obstructive nephropathy  . Diverticulosis   . Dysuria   . Elevated PSA, greater than or equal to 20 ng/ml June '13   PSA 107  . Hemorrhoids, internal, with bleeding, prolapse 09/19/2014  . Hyperlipidemia 05/29/2014  . Hypertension   . Obstructive uropathy 11/27/2015  . Renal insufficiency   . Tuberculosis    h/o PPD +  . UTI (urinary tract infection)   . Vitamin D deficiency 09/13/2017    Past Surgical History:  Procedure Laterality Date  . CARPAL TUNNEL RELEASE Right   . COLONOSCOPY    . HEMORRHOID BANDING    . PROSTATE SURGERY    . SPLENECTOMY      Social History   Socioeconomic History  . Marital status: Married    Spouse name: Not on file  . Number of children: 2  . Years of education: 66  . Highest education level: Not on file  Occupational History  . 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 Financial resource strain: Not on file  . Food insecurity    Worry: Not on file    Inability: Not on file  . Transportation needs    Medical: Not on file    Non-medical: Not on file  Tobacco Use  . Smoking status: Former Smoker    Types: Cigarettes  . Smokeless tobacco: Never Used  . Tobacco comment: Quit 1981  Substance and Sexual Activity  . Alcohol use: No    Alcohol/week: 0.0 standard drinks    Comment: Quit 2004  . Drug use: No  . Sexual activity: Never  Lifestyle  . Physical activity    Days per week: Not on file    Minutes per session: Not on file  . Stress: Not on file  Relationships  . Social Herbalist on phone: Not on file    Gets together: Not on file    Attends religious service: Not on file    Active member of club or organization: Not on file    Attends meetings of clubs or organizations: Not on file    Relationship status: Not on file  . Intimate partner violence    Fear of current or ex partner: Not on file    Emotionally abused: Not on file  Physically abused: Not on file    Forced sexual activity: Not on file  Other Topics Concern  . Not on file  Social History Narrative   Native of [place], raised poor farming community. . To [place] '78 - finished College, all but Arts administrator. Married - wife in [place], blocked from [place] to [place]. 1 son '90; 1 dtr '93. 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 Resources depleted.   Right handed.   Caffeine Hot daily one daily.    Family History  Problem Relation Age of Onset  . Diabetes Mother   . Stroke Brother   . Hearing loss Brother   . Colon cancer Neg Hx   . Rectal cancer Neg Hx   . Stomach cancer Neg Hx   . Esophageal cancer Neg Hx     Anti-infectives: Anti-infectives (From admission, onward)   None      Current Facility-Administered Medications  Medication Dose Route Frequency Provider Last Rate Last Dose  . sodium chloride irrigation 0.9  % 1,000 mL  1,000 mL Irrigation Continuous Dorie Rank, MD       Current Outpatient Medications  Medication Sig Dispense Refill  . acetaminophen (TYLENOL) 500 MG tablet Take 1,000 mg by mouth every 6 (six) hours as needed (pain).    Marland Kitchen allopurinol (ZYLOPRIM) 100 MG tablet Take 2 tablets (200 mg total) by mouth daily. (Patient taking differently: Take 100 mg by mouth daily. ) 180 tablet 3  . atorvastatin (LIPITOR) 40 MG tablet TAKE 1 TABLET BY MOUTH ONCE DAILY (Patient taking differently: Take 40 mg by mouth at bedtime. ) 90 tablet 3  . cholecalciferol (VITAMIN D3) 25 MCG (1000 UT) tablet Take 1,000 Units by mouth daily.    . colchicine 0.6 MG tablet Take 0.6 mg by mouth daily as needed (gout).     . finasteride (PROSCAR) 5 MG tablet TAKE 1 TABLET BY MOUTH ONCE DAILY (Patient taking differently: Take 5 mg by mouth daily. ) 90 tablet 0  . FOLIC ACID PO Take 1 tablet by mouth daily.    Marland Kitchen Hyoscyamine Sulfate SL (LEVSIN/SL) 0.125 MG SUBL Place 0.125 mg under the tongue every 6 (six) hours as needed (bladder spasm). 30 tablet 2  . lidocaine (XYLOCAINE) 2 % jelly Place 1 application into the urethra as needed. (Patient taking differently: Place 1 application into the urethra daily as needed (pain from disposable catheter). ) 30 mL 0  . metoprolol tartrate (LOPRESSOR) 25 MG tablet Take 1 tablet by mouth twice daily (Patient taking differently: Take 25 mg by mouth 2 (two) times daily. (BETA BLOCKER)) 180 tablet 0  . Misc Natural Products (TART CHERRY ADVANCED PO) Take 1 tablet by mouth daily.    Marland Kitchen POTASSIUM PO Take 1 tablet by mouth daily.    . tamsulosin (FLOMAX) 0.4 MG CAPS capsule Take 0.4 mg by mouth daily.       Objective: Vital signs in last 24 hours: Temp:  [99.1 F (37.3 C)] 99.1 F (37.3 C) (09/06 0653) Pulse Rate:  [68-116] 116 (09/06 1831) Resp:  [17-18] 18 (09/06 1831) BP: (154-162)/(82-98) 162/82 (09/06 1831) SpO2:  [94 %-98 %] 98 % (09/06 1831)  Intake/Output from previous day: No  intake/output data recorded. Intake/Output this shift: No intake/output data recorded.   Physical Exam Constitutional:      Appearance: Normal appearance. He is ill-appearing.  HENT:     Head: Normocephalic and atraumatic.  Neck:     Musculoskeletal: Normal range of motion and  neck supple.  Cardiovascular:     Rate and Rhythm: Regular rhythm. Tachycardia present.  Pulmonary:     Effort: Pulmonary effort is normal. No respiratory distress.  Abdominal:     Palpations: Abdomen is soft.     Tenderness: Tenderness: in SP area.     Comments: Well healed lower midline scar  Genitourinary:    Comments: Foley at the meatus with bloody urine.  Otherwise normal genitalia.  Musculoskeletal:        General: Swelling and tenderness (in left knee from gout) present.  Skin:    General: Skin is warm and dry.  Neurological:     General: No focal deficit present.     Mental Status: He is alert and oriented to person, place, and time.  Psychiatric:        Behavior: Behavior normal.        Thought Content: Thought content normal.     Lab Results:  Recent Labs    02/09/19 1336  02/11/19 0624 02/11/19 2001  WBC 7.7  --   --  11.7*  HGB 13.9   < > 10.3* 10.5*  HCT 43.9   < > 32.7* 33.3*  PLT 243  --   --  178   < > = values in this interval not displayed.   BMET Recent Labs    02/10/19 0530 02/11/19 2001  NA 141 138  K 3.5 3.7  CL 107 100  CO2 27 27  GLUCOSE 99 198*  BUN 39* 39*  CREATININE 2.58* 2.84*  CALCIUM 8.2* 8.4*   PT/INR Recent Labs    02/09/19 1336  LABPROT 14.7  INR 1.2   ABG No results for input(s): PHART, HCO3 in the last 72 hours.  Invalid input(s): PCO2, PO2  Studies/Results: No results found.   His catheter was hand irrigated with return of several hundred ml of fresh clot.  The urine cleared partially and remains pink on CBI.     30 minutes of critical care time at bedside during irrigation.    Assessment: BPH with recurrent hematuria and  clot retention.    His Hgb is stable but I will follow closely and keep him NPO p MN.  If the bleeding persists, I will consider going to the OR for cystoscopy and fulguration.  I will type and screen as I suspect his Hgb will drift down.   He will be kept on CBI.     Rocephin ordered for empiric coverage.  He has a mild leukocytosis and a very low grade fever.  CKD with Cr up to 2.84.   He was Covid negative on 02/09/19.   CC: Dr. Nicolette Bang.      Irine Seal 02/11/2019 [phone]

## 2019-02-11 NOTE — Progress Notes (Signed)
Patient states he will put leg bag at home. Patient provided with supplies.

## 2019-02-11 NOTE — ED Notes (Signed)
Attempted to call report. Receiving RN in a room.

## 2019-02-11 NOTE — Discharge Instructions (Signed)
Indwelling Urinary Catheter Care, Adult °An indwelling urinary catheter is a thin tube that is put into your bladder. The tube helps to drain pee (urine) out of your body. The tube goes in through your urethra. Your urethra is where pee comes out of your body. Your pee will come out through the catheter, then it will go into a bag (drainage bag). °Take good care of your catheter so it will work well. °How to wear your catheter and bag °Supplies needed °· Sticky tape (adhesive tape) or a leg strap. °· Alcohol wipe or soap and water (if you use tape). °· A clean towel (if you use tape). °· Large overnight bag. °· Smaller bag (leg bag). °Wearing your catheter °Attach your catheter to your leg with tape or a leg strap. °· Make sure the catheter is not pulled tight. °· If a leg strap gets wet, take it off and put on a dry strap. °· If you use tape to hold the bag on your leg: °1. Use an alcohol wipe or soap and water to wash your skin where the tape made it sticky before. °2. Use a clean towel to pat-dry that skin. °3. Use new tape to make the bag stay on your leg. °Wearing your bags °You should have been given a large overnight bag. °· You may wear the overnight bag in the day or night. °· Always have the overnight bag lower than your bladder.  Do not let the bag touch the floor. °· Before you go to sleep, put a clean plastic bag in a wastebasket. Then hang the overnight bag inside the wastebasket. °You should also have a smaller leg bag that fits under your clothes. °· Always wear the leg bag below your knee. °· Do not wear your leg bag at night. °How to care for your skin and catheter °Supplies needed °· A clean washcloth. °· Water and mild soap. °· A clean towel. °Caring for your skin and catheter ° °  ° °· Clean the skin around your catheter every day: °? Wash your hands with soap and water. °? Wet a clean washcloth in warm water and mild soap. °? Clean the skin around your urethra. °? If you are male: °? Gently  spread the folds of skin around your vagina (labia). °? With the washcloth in your other hand, wipe the inner side of your labia on each side. Wipe from front to back. °? If you are male: °? Pull back any skin that covers the end of your penis (foreskin). °? With the washcloth in your other hand, wipe your penis in small circles. Start wiping at the tip of your penis, then move away from the catheter. °? Move the foreskin back in place, if needed. °? With your free hand, hold the catheter close to where it goes into your body. °? Keep holding the catheter during cleaning so it does not get pulled out. °? With the washcloth in your other hand, clean the catheter. °? Only wipe downward on the catheter. °? Do not wipe upward toward your body. Doing this may push germs into your urethra and cause infection. °? Use a clean towel to pat-dry the catheter and the skin around it. Make sure to wipe off all soap. °? Wash your hands with soap and water. °· Shower every day. Do not take baths. °· Do not use cream, ointment, or lotion on the area where the catheter goes into your body, unless your doctor tells you   to. °· Do not use powders, sprays, or lotions on your genital area. °· Check your skin around the catheter every day for signs of infection. Check for: °? Redness, swelling, or pain. °? Fluid or blood. °? Warmth. °? Pus or a bad smell. °How to empty the bag °Supplies needed °· Rubbing alcohol. °· Gauze pad or cotton ball. °· Tape or a leg strap. °Emptying the bag °Pour the pee out of your bag when it is ?-½ full, or at least 2-3 times a day. Do this for your overnight bag and your leg bag. °1. Wash your hands with soap and water. °2. Separate (detach) the bag from your leg. °3. Hold the bag over the toilet or a clean pail. Keep the bag lower than your hips and bladder. This is so the pee (urine) does not go back into the tube. °4. Open the pour spout. It is at the bottom of the bag. °5. Empty the pee into the toilet or  pail. Do not let the pour spout touch any surface. °6. Put rubbing alcohol on a gauze pad or cotton ball. °7. Use the gauze pad or cotton ball to clean the pour spout. °8. Close the pour spout. °9. Attach the bag to your leg with tape or a leg strap. °10. Wash your hands with soap and water. °Follow instructions for cleaning the drainage bag: °· From the product maker. °· As told by your doctor. °How to change the bag °Supplies needed °· Alcohol wipes. °· A clean bag. °· Tape or a leg strap. °Changing the bag °Replace your bag when it starts to leak, smell bad, or look dirty. °1. Wash your hands with soap and water. °2. Separate the dirty bag from your leg. °3. Pinch the catheter with your fingers so that pee does not spill out. °4. Separate the catheter tube from the bag tube where these tubes connect (at the connection valve). Do not let the tubes touch any surface. °5. Clean the end of the catheter tube with an alcohol wipe. Use a different alcohol wipe to clean the end of the bag tube. °6. Connect the catheter tube to the tube of the clean bag. °7. Attach the clean bag to your leg with tape or a leg strap. Do not make the bag tight on your leg. °8. Wash your hands with soap and water. °General rules ° °· Never pull on your catheter. Never try to take it out. Doing that can hurt you. °· Always wash your hands before and after you touch your catheter or bag. Use a mild, fragrance-free soap. If you do not have soap and water, use hand sanitizer. °· Always make sure there are no twists or bends (kinks) in the catheter tube. °· Always make sure there are no leaks in the catheter or bag. °· Drink enough fluid to keep your pee pale yellow. °· Do not take baths, swim, or use a hot tub. °· If you are male, wipe from front to back after you poop (have a bowel movement). °Contact a doctor if: °· Your pee is cloudy. °· Your pee smells worse than usual. °· Your catheter gets clogged. °· Your catheter leaks. °· Your bladder  feels full. °Get help right away if: °· You have redness, swelling, or pain where the catheter goes into your body. °· You have fluid, blood, pus, or a bad smell coming from the area where the catheter goes into your body. °· Your skin feels warm where   the catheter goes into your body. °· You have a fever. °· You have pain in your: °? Belly (abdomen). °? Legs. °? Lower back. °? Bladder. °· You see blood in the catheter. °· Your pee is pink or red. °· You feel sick to your stomach (nauseous). °· You throw up (vomit). °· You have chills. °· Your pee is not draining into the bag. °· Your catheter gets pulled out. °Summary °· An indwelling urinary catheter is a thin tube that is placed into the bladder to help drain pee (urine) out of the body. °· The catheter is placed into the part of the body that drains pee from the bladder (urethra). °· Taking good care of your catheter will keep it working properly and help prevent problems. °· Always wash your hands before and after touching your catheter or bag. °· Never pull on your catheter or try to take it out. °This information is not intended to replace advice given to you by your health care provider. Make sure you discuss any questions you have with your health care provider. °Document Released: 09/18/2012 Document Revised: 09/15/2018 Document Reviewed: 01/07/2017 °Elsevier Patient Education © 2020 [employer] ° °

## 2019-02-11 NOTE — Discharge Summary (Signed)
Physician Discharge Summary  Patient ID: [name] MRN: [id number] DOB/AGE: March 13, 1951 68 y.o.  Admit date: 02/09/2019 Discharge date: 02/11/2019  Admission Diagnoses:  Hematuria  Discharge Diagnoses:  Principal Problem:   Hematuria Active Problems:   CKD (chronic kidney disease) stage 3, GFR 30-59 ml/min (HCC)   Gout   Urinary retention due to benign prostatic hyperplasia   Past Medical History:  Diagnosis Date  . Anemia    normal Fe, nl B12, nl retic, nl EPO July '13  . Blood transfusion without reported diagnosis   . BPH (benign prostatic hyperplasia)   . Chronic back pain   . Chronic kidney disease    CKD III, obstructive nephropathy  . Diverticulosis   . Dysuria   . Elevated PSA, greater than or equal to 20 ng/ml June '13   PSA 107  . Hemorrhoids, internal, with bleeding, prolapse 09/19/2014  . Hyperlipidemia 05/29/2014  . Hypertension   . Obstructive uropathy 11/27/2015  . Renal insufficiency   . Tuberculosis    h/o PPD +  . UTI (urinary tract infection)   . Vitamin D deficiency 09/13/2017    Surgeries:  on * No surgery found *   Consultants (if any):   Discharged Condition: Improved  Hospital Course: [name] is an 68 y.o. male who was admitted 02/09/2019 with a diagnosis of Hematuria  With clot retention and acute blood loss anemia secondary to massive BPH with BOO that was being managed with CIC and finasteride.  He is scheduled for simple prostatectomy in early October by Dr. Alyson Ingles.   He was successfully irrigated free of clots and his urine is clear today on a minimal drip and he is felt to be ready for discharge.  His Hgb declined from 13.9 to 10.3.   He has CKD with a Cr of 2.58.   He has some bladder spasms and also has issues with gout in the right knee with pain and is on colchicine for that.  He is ready for discharge and will be sent home with a script for levsin for the spasms and he will be kept off of aspirin.  He was encouraged to contact the  office to come in later this week for a catheter change to a smaller caliber.     He was given perioperative antibiotics:  Anti-infectives (From admission, onward)   None    .  He was given sequential compression devices for DVT prophylaxis.  He benefited maximally from the hospital stay and there were no complications.    Recent vital signs:  Vitals:   02/10/19 2053 02/11/19 0653  BP: (!) 151/98 (!) 154/98  Pulse: 67 68  Resp: 17 17  Temp: 98.6 F (37 C) 99.1 F (37.3 C)  SpO2: 97% 94%    Recent laboratory studies:  Lab Results  Component Value Date   HGB 10.3 (L) 02/11/2019   HGB 11.1 (L) 02/10/2019   HGB 13.9 02/09/2019   Lab Results  Component Value Date   WBC 7.7 02/09/2019   PLT 243 02/09/2019   Lab Results  Component Value Date   INR 1.2 02/09/2019   Lab Results  Component Value Date   NA 141 02/10/2019   K 3.5 02/10/2019   CL 107 02/10/2019   CO2 27 02/10/2019   BUN 39 (H) 02/10/2019   CREATININE 2.58 (H) 02/10/2019   GLUCOSE 99 02/10/2019    Discharge Medications:   Allergies as of 02/11/2019   No Known Allergies  Medication List    STOP taking these medications   aspirin EC 81 MG tablet     TAKE these medications   acetaminophen 500 MG tablet Commonly known as: TYLENOL Take 1,000 mg by mouth every 6 (six) hours as needed (pain).   allopurinol 100 MG tablet Commonly known as: ZYLOPRIM Take 2 tablets (200 mg total) by mouth daily. What changed: how much to take   atorvastatin 40 MG tablet Commonly known as: LIPITOR TAKE 1 TABLET BY MOUTH ONCE DAILY What changed: when to take this   cholecalciferol 25 MCG (1000 UT) tablet Commonly known as: VITAMIN D3 Take 1,000 Units by mouth daily.   colchicine 0.6 MG tablet Take 0.6 mg by mouth daily as needed (gout).   finasteride 5 MG tablet Commonly known as: PROSCAR TAKE 1 TABLET BY MOUTH ONCE DAILY   FOLIC ACID PO Take 1 tablet by mouth daily.   Hyoscyamine Sulfate SL 0.125 MG  Subl Commonly known as: Levsin/SL Place 0.125 mg under the tongue every 6 (six) hours as needed (bladder spasm).   lidocaine 2 % jelly Commonly known as: XYLOCAINE Place 1 application into the urethra as needed. What changed:   when to take this  reasons to take this   metoprolol tartrate 25 MG tablet Commonly known as: LOPRESSOR Take 1 tablet by mouth twice daily What changed: additional instructions   POTASSIUM PO Take 1 tablet by mouth daily.   tamsulosin 0.4 MG Caps capsule Commonly known as: FLOMAX Take 0.4 mg by mouth daily.   TART CHERRY ADVANCED PO Take 1 tablet by mouth daily.       Diagnostic Studies: No results found.  Disposition: Discharge disposition: 01-Home or Self Care       Discharge Instructions    Discontinue IV   Complete by: As directed    Urinary leg bag   Complete by: As directed       Follow-up Information    [facility]. Call.   Why: Please call the office to come in this week to have the catheter changed to a smaller caliber.  You can see a nurse of a Designer, jewellery.  Contact information: [place]           Signed: Irine Seal 02/11/2019, 8:59 AM

## 2019-02-11 NOTE — ED Notes (Addendum)
Attempted to call report. Nurse in room, said to call back in 5 minutes.

## 2019-02-11 NOTE — ED Triage Notes (Addendum)
Pt has blood in leg bag, states he believes his foley catheter is clogged. Pt discharged from hospital this morning, hospitalized for hematuria and had foley placed.

## 2019-02-11 NOTE — ED Provider Notes (Signed)
[facility] DEPT Provider Note   CSN: [id number] Arrival date & time: 02/11/19  1821     History   Chief Complaint Chief Complaint  Patient presents with  . Urinary Retention  . Hematuria    HPI Kaylor Bort is a 68 y.o. male.     HPI Patient presents the emergency room for evaluation of Foley catheter obstruction.  Patient was seen by me in the emergency room on September 4 for gross hematuria and urinary obstruction.  Patient was evaluated by Dr. Alinda Money, urology, in the emergency room as nursing staff was unable to place a catheter.  Patient was admitted to the hospital for continuous bladder irrigation.  Patient was noted to have a drop in his hemoglobin from 13.9-10.3.  Patient's urine was clear after irrigation and he was discharged this morning.  Patient returns to the ED this evening because he states he feels like his catheter is clogged again.  Patient is having some bloody urinary drainage from the tip of his penis around the catheter.  He does not feel like the catheter bag is filling properly he is having discomfort in his bladder again. Past Medical History:  Diagnosis Date  . Anemia    normal Fe, nl B12, nl retic, nl EPO July '13  . Blood transfusion without reported diagnosis   . BPH (benign prostatic hyperplasia)   . Chronic back pain   . Chronic kidney disease    CKD III, obstructive nephropathy  . Diverticulosis   . Dysuria   . Elevated PSA, greater than or equal to 20 ng/ml June '13   PSA 107  . Hemorrhoids, internal, with bleeding, prolapse 09/19/2014  . Hyperlipidemia 05/29/2014  . Hypertension   . Obstructive uropathy 11/27/2015  . Renal insufficiency   . Tuberculosis    h/o PPD +  . UTI (urinary tract infection)   . Vitamin D deficiency 09/13/2017    Patient Active Problem List   Diagnosis Date Noted  . Clot retention of urine 02/11/2019  . Vitamin D deficiency 09/13/2017  . Urinary tract infection without hematuria  07/23/2017  . Low back pain 07/03/2017  . Dizziness 06/30/2017  . Acute gouty arthritis 12/01/2016  . Degenerative arthritis of knee, bilateral 11/18/2016  . Urinary retention due to benign prostatic hyperplasia 10/24/2016  . Mass of right side of neck 10/20/2016  . Gout 10/20/2016  . Knee pain, acute 10/12/2016  . Hypokalemia 05/28/2016  . Encounter for well adult exam with abnormal findings 11/27/2015  . Obstructive uropathy 11/27/2015  . Elevated PSA 11/27/2015  . Acute pyelonephritis 05/27/2015  . Generalized bloating 05/27/2015  . Constipation 05/27/2015  . Chest pain 05/27/2015  . AKI (acute kidney injury) ([place]) 05/27/2015  . Acute sinus infection 11/22/2014  . Cough 09/25/2014  . Hemorrhoids, internal, with bleeding, prolapse 09/19/2014  . 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 Hyperlipidemia 05/29/2014  . Lumbar radiculopathy 05/23/2014  . Lumbar stenosis 11/20/2013  . Abnormal glucose 11/06/2013  . Unspecified hereditary and idiopathic peripheral neuropathy 01/22/2013  . Cardiomyopathy due to hypertension ([place]) 12/28/2011  . CKD (chronic kidney disease) stage 3, GFR 30-59 ml/min (HCC) 11/24/2011  . Hematuria 11/24/2011  . Hydronephrosis 11/24/2011  . Anemia 11/24/2011  . BRADYCARDIA 02/05/2010  . TINEA PEDIS 01/29/2010  . 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 PERIPHERAL EDEMA 01/29/2010  . ABNORMAL ELECTROCARDIOGRAM 01/29/2010  . 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  . Osteoarthrosis, generalized, 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  . ONYCHOMYCOSIS, TOENAILS 10/02/2007  . BPH (benign prostatic hyperplasia)  10/02/2007  . Essential hypertension 03/06/2007    Past Surgical History:  Procedure Laterality Date  . CARPAL TUNNEL RELEASE Right   . COLONOSCOPY    . HEMORRHOID BANDING    . PROSTATE SURGERY    . SPLENECTOMY          Home Medications    Prior to Admission medications   Medication Sig Start Date End Date Taking? Authorizing Provider  acetaminophen (TYLENOL) 500 MG  tablet Take 1,000 mg by mouth every 6 (six) hours as needed (pain).   Yes [provider]  allopurinol (ZYLOPRIM) 100 MG tablet Take 2 tablets (200 mg total) by mouth daily. Patient taking differently: Take 100 mg by mouth daily.  05/12/18  Yes Biagio Borg, MD  atorvastatin (LIPITOR) 40 MG tablet TAKE 1 TABLET BY MOUTH ONCE DAILY Patient taking differently: Take 40 mg by mouth at bedtime.  01/10/18  Yes Biagio Borg, MD  cholecalciferol (VITAMIN D3) 25 MCG (1000 UT) tablet Take 1,000 Units by mouth daily.   Yes [provider]  colchicine 0.6 MG tablet Take 0.6 mg by mouth daily as needed (gout).  10/21/18  Yes [provider]  finasteride (PROSCAR) 5 MG tablet TAKE 1 TABLET BY MOUTH ONCE DAILY Patient taking differently: Take 5 mg by mouth daily.  06/12/18  Yes Biagio Borg, MD  FOLIC ACID PO Take 1 tablet by mouth daily.   Yes [provider]  Hyoscyamine Sulfate SL (LEVSIN/SL) 0.125 MG SUBL Place 0.125 mg under the tongue every 6 (six) hours as needed (bladder spasm). 02/11/19  Yes Irine Seal, MD  lidocaine (XYLOCAINE) 2 % jelly Place 1 application into the urethra as needed. Patient taking differently: Place 1 application into the urethra daily as needed (pain from disposable catheter).  04/21/18  Yes Recardo Evangelist, PA-C  metoprolol tartrate (LOPRESSOR) 25 MG tablet Take 1 tablet by mouth twice daily Patient taking differently: Take 25 mg by mouth 2 (two) times daily. (BETA BLOCKER) 08/23/18  Yes Biagio Borg, MD  Misc Natural Products (TART CHERRY ADVANCED PO) Take 1 tablet by mouth daily.   Yes [provider]  POTASSIUM PO Take 1 tablet by mouth daily.   Yes [provider]  tamsulosin (FLOMAX) 0.4 MG CAPS capsule Take 0.4 mg by mouth daily. 10/17/18  Yes [provider]    Family History Family History  Problem Relation Age of Onset  . Diabetes Mother   . Stroke Brother   . Hearing loss Brother   . Colon cancer Neg Hx    . Rectal cancer Neg Hx   . Stomach cancer Neg Hx   . Esophageal cancer Neg Hx     Social History Social History   Tobacco Use  . Smoking status: Former Smoker    Types: Cigarettes  . Smokeless tobacco: Never Used  . Tobacco comment: Quit 1981  Substance Use Topics  . Alcohol use: No    Alcohol/week: 0.0 standard drinks    Comment: Quit 2004  . Drug use: No     Allergies   Patient has no known allergies.   Review of Systems Review of Systems  All other systems reviewed and are negative.    Physical Exam Updated Vital Signs BP 116/81   Pulse 96   Resp 18   SpO2 94%   Physical Exam Vitals signs and nursing note reviewed.  Constitutional:      Appearance: He is well-developed. He is ill-appearing.  HENT:  Head: Normocephalic and atraumatic.     Right Ear: External ear normal.     Left Ear: External ear normal.  Eyes:     General: No scleral icterus.       Right eye: No discharge.        Left eye: No discharge.     Conjunctiva/sclera: Conjunctivae normal.  Neck:     Musculoskeletal: Neck supple.     Trachea: No tracheal deviation.  Cardiovascular:     Rate and Rhythm: Tachycardia present.  Pulmonary:     Effort: Pulmonary effort is normal. No respiratory distress.     Breath sounds: No stridor.  Abdominal:     General: There is no distension.     Tenderness: There is abdominal tenderness.     Comments: Suprapubic region  Genitourinary:    Comments: Gross blood in the catheter bag, some bloody urinary drainage from around the tip of the penis, catheter is in place Musculoskeletal:        General: No swelling or deformity.  Skin:    General: Skin is warm and dry.     Findings: No rash.  Neurological:     Mental Status: He is alert.     Cranial Nerves: Cranial nerve deficit: no gross deficits.      ED Treatments / Results  Labs (all labs ordered are listed, but only abnormal results are displayed) Labs Reviewed  CBC - Abnormal; Notable for  the following components:      Result Value   WBC 11.7 (*)    RBC 3.61 (*)    Hemoglobin 10.5 (*)    HCT 33.3 (*)    All other components within normal limits  BASIC METABOLIC PANEL - Abnormal; Notable for the following components:   Glucose, Bld 198 (*)    BUN 39 (*)    Creatinine, Ser 2.84 (*)    Calcium 8.4 (*)    GFR calc non Af Amer 22 (*)    GFR calc Af Amer 25 (*)    All other components within normal limits    EKG None  Radiology No results found.  Procedures Procedures (including critical care time)  Medications Ordered in ED Medications  sodium chloride irrigation 0.9 % 1,000 mL (has no administration in time range)  HYDROmorphone (DILAUDID) injection 1 mg (1 mg Intravenous Given 02/11/19 2002)     Initial Impression / Assessment and Plan / ED Course  I have reviewed the triage vital signs and the nursing notes.  Pertinent labs & imaging results that were available during my care of the patient were reviewed by me and considered in my medical decision making (see chart for details).  Clinical Course as of Feb 11 2156  Nancy Fetter Feb 11, 2019  1946 RN staff attempted to irrigate foley unsuccessfully.  New foley placed and still unable to get a urine output.  Pt noted to have large clots.  Concerned his has bladder outlet obstruction again from his gross hematuria and blood clots.  WIll consult urology.   [JK]  2109 Dr. Joya Gaskins is at the bedside.  Patient is receiving continuous bladder irrigation   [JK]    Clinical Course User Index [JK] Dorie Rank, MD     Patient unfortunately return for recurrent hematuria resulting in urinary retention after just being discharged from the hospital today.  Patient required urology evaluation again.  Dr. Jeffie Pollock evaluated the patient in the ED and performed continuous bladder irrigation.  Patient will be readmitted to the  hospital for further treatment.  Final Clinical Impressions(s) / ED Diagnoses   Final diagnoses:  Urinary  retention  Chronic kidney disease, unspecified CKD stage  Anemia, unspecified type    ED Discharge Orders    None       Dorie Rank, MD 02/11/19 2158

## 2019-02-11 NOTE — ED Notes (Signed)
Attempted to irrigate catheter with Catalina Antigua, RN. 30 mL return of clots. Catheter became clogged and stopped returning all fluid. Provider ordered the removal of current cath and placement of a new cath. New cath placed. No return.

## 2019-02-12 ENCOUNTER — Other Ambulatory Visit: Payer: Self-pay | Admitting: Internal Medicine

## 2019-02-12 DIAGNOSIS — Y846 Urinary catheterization as the cause of abnormal reaction of the patient, or of later complication, without mention of misadventure at the time of the procedure: Secondary | ICD-10-CM | POA: Diagnosis present

## 2019-02-12 DIAGNOSIS — M10061 Idiopathic gout, right knee: Secondary | ICD-10-CM | POA: Diagnosis not present

## 2019-02-12 DIAGNOSIS — Z87891 Personal history of nicotine dependence: Secondary | ICD-10-CM | POA: Diagnosis not present

## 2019-02-12 DIAGNOSIS — N183 Chronic kidney disease, stage 3 (moderate): Secondary | ICD-10-CM | POA: Diagnosis present

## 2019-02-12 DIAGNOSIS — R339 Retention of urine, unspecified: Secondary | ICD-10-CM | POA: Diagnosis present

## 2019-02-12 DIAGNOSIS — E785 Hyperlipidemia, unspecified: Secondary | ICD-10-CM | POA: Diagnosis present

## 2019-02-12 DIAGNOSIS — M549 Dorsalgia, unspecified: Secondary | ICD-10-CM | POA: Diagnosis present

## 2019-02-12 DIAGNOSIS — I129 Hypertensive chronic kidney disease with stage 1 through stage 4 chronic kidney disease, or unspecified chronic kidney disease: Secondary | ICD-10-CM | POA: Diagnosis present

## 2019-02-12 DIAGNOSIS — M109 Gout, unspecified: Secondary | ICD-10-CM | POA: Diagnosis present

## 2019-02-12 DIAGNOSIS — N3289 Other specified disorders of bladder: Secondary | ICD-10-CM | POA: Diagnosis not present

## 2019-02-12 DIAGNOSIS — R310 Gross hematuria: Secondary | ICD-10-CM | POA: Diagnosis not present

## 2019-02-12 DIAGNOSIS — Z79899 Other long term (current) drug therapy: Secondary | ICD-10-CM | POA: Diagnosis not present

## 2019-02-12 DIAGNOSIS — R338 Other retention of urine: Secondary | ICD-10-CM | POA: Diagnosis not present

## 2019-02-12 DIAGNOSIS — N320 Bladder-neck obstruction: Secondary | ICD-10-CM | POA: Diagnosis present

## 2019-02-12 DIAGNOSIS — N138 Other obstructive and reflux uropathy: Secondary | ICD-10-CM | POA: Diagnosis present

## 2019-02-12 DIAGNOSIS — Z87440 Personal history of urinary (tract) infections: Secondary | ICD-10-CM | POA: Diagnosis not present

## 2019-02-12 DIAGNOSIS — N029 Recurrent and persistent hematuria with unspecified morphologic changes: Secondary | ICD-10-CM | POA: Diagnosis present

## 2019-02-12 DIAGNOSIS — T83098A Other mechanical complication of other indwelling urethral catheter, initial encounter: Secondary | ICD-10-CM | POA: Diagnosis present

## 2019-02-12 DIAGNOSIS — M25461 Effusion, right knee: Secondary | ICD-10-CM | POA: Diagnosis not present

## 2019-02-12 DIAGNOSIS — Z9081 Acquired absence of spleen: Secondary | ICD-10-CM | POA: Diagnosis not present

## 2019-02-12 DIAGNOSIS — N390 Urinary tract infection, site not specified: Secondary | ICD-10-CM | POA: Diagnosis not present

## 2019-02-12 DIAGNOSIS — N401 Enlarged prostate with lower urinary tract symptoms: Secondary | ICD-10-CM | POA: Diagnosis present

## 2019-02-12 DIAGNOSIS — D62 Acute posthemorrhagic anemia: Secondary | ICD-10-CM | POA: Diagnosis present

## 2019-02-12 DIAGNOSIS — R1084 Generalized abdominal pain: Secondary | ICD-10-CM | POA: Diagnosis not present

## 2019-02-12 DIAGNOSIS — E559 Vitamin D deficiency, unspecified: Secondary | ICD-10-CM | POA: Diagnosis present

## 2019-02-12 DIAGNOSIS — M1711 Unilateral primary osteoarthritis, right knee: Secondary | ICD-10-CM | POA: Diagnosis not present

## 2019-02-12 LAB — CBC
HCT: 26.9 % — ABNORMAL LOW (ref 39.0–52.0)
Hemoglobin: 8.3 g/dL — ABNORMAL LOW (ref 13.0–17.0)
MCH: 28.7 pg (ref 26.0–34.0)
MCHC: 30.9 g/dL (ref 30.0–36.0)
MCV: 93.1 fL (ref 80.0–100.0)
Platelets: 143 10*3/uL — ABNORMAL LOW (ref 150–400)
RBC: 2.89 MIL/uL — ABNORMAL LOW (ref 4.22–5.81)
RDW: 13.6 % (ref 11.5–15.5)
WBC: 10.8 10*3/uL — ABNORMAL HIGH (ref 4.0–10.5)
nRBC: 0 % (ref 0.0–0.2)

## 2019-02-12 LAB — BASIC METABOLIC PANEL
Anion gap: 8 (ref 5–15)
BUN: 41 mg/dL — ABNORMAL HIGH (ref 8–23)
CO2: 26 mmol/L (ref 22–32)
Calcium: 8.1 mg/dL — ABNORMAL LOW (ref 8.9–10.3)
Chloride: 105 mmol/L (ref 98–111)
Creatinine, Ser: 2.79 mg/dL — ABNORMAL HIGH (ref 0.61–1.24)
GFR calc Af Amer: 26 mL/min — ABNORMAL LOW (ref >60)
GFR calc non Af Amer: 22 mL/min — ABNORMAL LOW (ref >60)
Glucose, Bld: 157 mg/dL — ABNORMAL HIGH (ref 70–99)
Potassium: 4.3 mmol/L (ref 3.5–5.1)
Sodium: 139 mmol/L (ref 135–145)

## 2019-02-12 LAB — HEMOGLOBIN AND HEMATOCRIT, BLOOD
HCT: 24.5 % — ABNORMAL LOW (ref 39.0–52.0)
HCT: 28 % — ABNORMAL LOW (ref 39.0–52.0)
HCT: 28.9 % — ABNORMAL LOW (ref 39.0–52.0)
Hemoglobin: 7.8 g/dL — ABNORMAL LOW (ref 13.0–17.0)
Hemoglobin: 8.2 g/dL — ABNORMAL LOW (ref 13.0–17.0)
Hemoglobin: 8.8 g/dL — ABNORMAL LOW (ref 13.0–17.0)

## 2019-02-12 LAB — PREPARE RBC (CROSSMATCH)

## 2019-02-12 MED ORDER — SODIUM CHLORIDE 0.9 % IV SOLN
INTRAVENOUS | Status: DC | PRN
  Administered 2019-02-12: 950 mL via INTRAVENOUS
  Administered 2019-02-17: 250 mL via INTRAVENOUS

## 2019-02-12 MED ORDER — SODIUM CHLORIDE 0.9% IV SOLUTION: Freq: Once | INTRAVENOUS | Status: DC

## 2019-02-12 MED ORDER — ESTROGENS CONJUGATED 1.25 MG PO TABS
5.0000 mg | ORAL_TABLET | Freq: Once | ORAL | Status: AC
  Administered 2019-02-12: 5 mg via ORAL
  Filled 2019-02-12: qty 4

## 2019-02-12 MED ORDER — KCL IN DEXTROSE-NACL 10-5-0.45 MEQ/L-%-% IV SOLN
INTRAVENOUS | Status: DC
  Administered 2019-02-12 – 2019-02-18 (×11): via INTRAVENOUS
  Filled 2019-02-12 (×13): qty 1000

## 2019-02-12 MED ORDER — SODIUM CHLORIDE 0.9 % IV SOLN
20.0000 ug | Freq: Once | INTRAVENOUS | Status: AC
  Administered 2019-02-12: 20 ug via INTRAVENOUS
  Filled 2019-02-12: qty 5

## 2019-02-12 NOTE — Progress Notes (Signed)
Upon arrival to floor, red urine noted in pt's foley tubing. Irrigation bags clamped. Pt c/o distention and discomfort. Per ED nurse, attempted to irrigate but unsuccessful. 30 cc Normal Saline used to irrigate cathter by this RN. Multiple clots returned and catheter flowing. Pt immediately verbalized relief. CBI running fast. Urine currently a pink color with no clots noted. Will continue to monitor closely.

## 2019-02-12 NOTE — Progress Notes (Signed)
The hematuria persists but it is not severe on a slow CBI.  The CBI rate was increased and I discussed his case with nephrology because of possible platelet dysfunction related to his CKD.   I will give him DDAVP 36mcg IV x 1 and 5mg  of conjugated estrogen po x 1.   I will reassess later today.    Sips and chips ordered.

## 2019-02-12 NOTE — Progress Notes (Signed)
Subjective: He is doing better this morning.  The urine has cleared on CBI with a moderately fast drip.  He is complaining of back pain and some bladder spasms.  His Hgb has dropped to 8.3 from 10.5 on admission.   ROS:  Review of Systems  All other systems reviewed and are negative.   Anti-infectives: Anti-infectives (From admission, onward)   Start     Dose/Rate Route Frequency Ordered Stop   02/11/19 2330  cefTRIAXone (ROCEPHIN) 1 g in sodium chloride 0.9 % 100 mL IVPB     1 g 200 mL/hr over 30 Minutes Intravenous Daily at bedtime 02/11/19 2315        Current Facility-Administered Medications  Medication Dose Route Frequency Provider Last Rate Last Dose  . 0.45 % NaCl with KCl 20 mEq / L infusion   Intravenous Continuous Irine Seal, MD 100 mL/hr at 02/12/19 0615    . acetaminophen (TYLENOL) tablet 650 mg  650 mg Oral Q4H PRN Irine Seal, MD      . allopurinol (ZYLOPRIM) tablet 200 mg  200 mg Oral Daily Irine Seal, MD      . atorvastatin (LIPITOR) tablet 40 mg  40 mg Oral QHS Irine Seal, MD   40 mg at 02/11/19 2347  . bisacodyl (DULCOLAX) suppository 10 mg  10 mg Rectal Daily PRN Irine Seal, MD      . cefTRIAXone (ROCEPHIN) 1 g in sodium chloride 0.9 % 100 mL IVPB  1 g Intravenous QHS Irine Seal, MD   Stopped at 02/12/19 0004  . cholecalciferol (VITAMIN D3) tablet 1,000 Units  1,000 Units Oral Daily Irine Seal, MD      . colchicine tablet 0.6 mg  0.6 mg Oral Daily PRN Irine Seal, MD      . docusate sodium (COLACE) capsule 100 mg  100 mg Oral BID Irine Seal, MD   100 mg at 02/11/19 2347  . finasteride (PROSCAR) tablet 5 mg  5 mg Oral Daily Irine Seal, MD      . HYDROmorphone (DILAUDID) injection 0.5-1 mg  0.5-1 mg Intravenous Q2H PRN Irine Seal, MD      . metoprolol tartrate (LOPRESSOR) tablet 25 mg  25 mg Oral BID Irine Seal, MD      . ondansetron [facility]) injection 4 mg  4 mg Intravenous Q4H PRN Irine Seal, MD      . opium-belladonna (B&O SUPPRETTES) 16.2-60 MG  suppository 1 suppository  1 suppository Rectal Q6H PRN Irine Seal, MD      . oxyCODONE (Oxy IR/ROXICODONE) immediate release tablet 5 mg  5 mg Oral Q4H PRN Irine Seal, MD      . senna-docusate (Senokot-S) tablet 1 tablet  1 tablet Oral QHS PRN Irine Seal, MD      . sodium chloride irrigation 0.9 % 1,000 mL  1,000 mL Irrigation Continuous Dorie Rank, MD      . sodium chloride irrigation 0.9 % 3,000 mL  3,000 mL Irrigation Continuous Irine Seal, MD 0 mL/hr at 02/12/19 0230 3,000 mL at 02/12/19 0649  . sodium phosphate (FLEET) 7-19 GM/118ML enema 1 enema  1 enema Rectal Once PRN Irine Seal, MD      . zolpidem (AMBIEN) tablet 5 mg  5 mg Oral QHS PRN Irine Seal, MD         Objective: Vital signs in last 24 hours: Temp:  [98.5 F (36.9 C)-99.1 F (37.3 C)] 99.1 F (37.3 C) (09/07 0506) Pulse Rate:  [86-116] 86 (09/07 0506) Resp:  [18] 18 (  09/06 2324) BP: (96-162)/(62-82) 115/81 (09/07 0506) SpO2:  [94 %-99 %] 99 % (09/07 0506) Weight:  [110.7 kg] 110.7 kg (09/06 2312)  Intake/Output from previous day: 09/06 0701 - 09/07 0700 In: 24721 [I.V.:591; IV Piggyback:100] Out: 23600 [Urine:23600] Intake/Output this shift: Total I/O In: -  Out: 1850 [Urine:1850]   Physical Exam Vitals signs reviewed.  Constitutional:      Appearance: Normal appearance.  Cardiovascular:     Rate and Rhythm: Normal rate and regular rhythm.  Pulmonary:     Effort: Pulmonary effort is normal. No respiratory distress.  Genitourinary:    Comments: Urine clear in foley Musculoskeletal: Normal range of motion.  Neurological:     General: No focal deficit present.     Mental Status: He is alert and oriented to person, place, and time.  Psychiatric:        Mood and Affect: Mood normal.        Behavior: Behavior normal.     Lab Results:  Recent Labs    02/11/19 2001 02/12/19 0030 02/12/19 0524  WBC 11.7*  --  10.8*  HGB 10.5* 8.8* 8.3*  HCT 33.3* 28.0* 26.9*  PLT 178  --  143*    BMET Recent Labs    02/11/19 2001 02/12/19 0524  NA 138 139  K 3.7 4.3  CL 100 105  CO2 27 26  GLUCOSE 198* 157*  BUN 39* 41*  CREATININE 2.84* 2.79*  CALCIUM 8.4* 8.1*   PT/INR Recent Labs    02/09/19 1336  LABPROT 14.7  INR 1.2   ABG No results for input(s): PHART, HCO3 in the last 72 hours.  Invalid input(s): PCO2, PO2  Studies/Results: No results found.   Assessment and Plan: BPH with Clot retention.   The urine has cleared on CBI but will pink up when the rate is reduced.   I am going to keep him NPO and reassess later this morning to determine if he needs to go to the OR.  Acute blood loss anemia with a decline in the Hgb to 8.3 from 10.5 on admission.  I will repeat later this morning and consider transfusion if there is a further decline.    CKD with stable Cr.  K is up slightly but still normal.  I will reduce K in IVF.     LOS: 0 days    Irine Seal 02/12/2019 [id number] ID: [name], male   DOB: 1950-12-15, 68 y.o.   MRN: [id number]

## 2019-02-12 NOTE — Progress Notes (Signed)
His hgb is down to 7.8.   I am going to transfuse one unit and have called him to review the risks.  I will give him a diet and continue to monitor for now.

## 2019-02-13 ENCOUNTER — Telehealth: Payer: Medicare Other | Admitting: Cardiovascular Disease

## 2019-02-13 LAB — TYPE AND SCREEN
ABO/RH(D): O POS
Antibody Screen: NEGATIVE
Unit division: 0

## 2019-02-13 LAB — BASIC METABOLIC PANEL
Anion gap: 5 (ref 5–15)
BUN: 34 mg/dL — ABNORMAL HIGH (ref 8–23)
CO2: 27 mmol/L (ref 22–32)
Calcium: 7.7 mg/dL — ABNORMAL LOW (ref 8.9–10.3)
Chloride: 105 mmol/L (ref 98–111)
Creatinine, Ser: 2.48 mg/dL — ABNORMAL HIGH (ref 0.61–1.24)
GFR calc Af Amer: 30 mL/min — ABNORMAL LOW (ref >60)
GFR calc non Af Amer: 26 mL/min — ABNORMAL LOW (ref >60)
Glucose, Bld: 126 mg/dL — ABNORMAL HIGH (ref 70–99)
Potassium: 3.9 mmol/L (ref 3.5–5.1)
Sodium: 137 mmol/L (ref 135–145)

## 2019-02-13 LAB — BPAM RBC
Blood Product Expiration Date: 202009122359
ISSUE DATE / TIME: 202009071546
Unit Type and Rh: 5100

## 2019-02-13 LAB — CBC
HCT: 25.2 % — ABNORMAL LOW (ref 39.0–52.0)
Hemoglobin: 7.9 g/dL — ABNORMAL LOW (ref 13.0–17.0)
MCH: 29.4 pg (ref 26.0–34.0)
MCHC: 31.3 g/dL (ref 30.0–36.0)
MCV: 93.7 fL (ref 80.0–100.0)
Platelets: 133 10*3/uL — ABNORMAL LOW (ref 150–400)
RBC: 2.69 MIL/uL — ABNORMAL LOW (ref 4.22–5.81)
RDW: 14.1 % (ref 11.5–15.5)
WBC: 11.5 10*3/uL — ABNORMAL HIGH (ref 4.0–10.5)
nRBC: 0 % (ref 0.0–0.2)

## 2019-02-13 MED ORDER — SODIUM CHLORIDE 0.9 % IV SOLN: 1.0000 g | Freq: Four times a day (QID) | INTRAVENOUS | Status: DC

## 2019-02-13 MED ORDER — SODIUM CHLORIDE 0.9 % IV SOLN
1.0000 g | Freq: Four times a day (QID) | INTRAVENOUS | Status: DC
  Administered 2019-02-13 – 2019-02-18 (×17): 1 g via INTRAVENOUS
  Filled 2019-02-13 (×4): qty 1
  Filled 2019-02-13: qty 1000
  Filled 2019-02-13 (×4): qty 1
  Filled 2019-02-13 (×3): qty 1000
  Filled 2019-02-13 (×2): qty 1
  Filled 2019-02-13: qty 1000
  Filled 2019-02-13: qty 1
  Filled 2019-02-13 (×2): qty 1000
  Filled 2019-02-13 (×2): qty 1

## 2019-02-13 NOTE — Plan of Care (Signed)
  Problem: Education: Goal: Knowledge of General Education information will improve Description: Including pain rating scale, medication(s)/side effects and non-pharmacologic comfort measures Outcome: Progressing   Problem: Clinical Measurements: Goal: Respiratory complications will improve Outcome: Progressing Goal: Cardiovascular complication will be avoided Outcome: Progressing   Problem: Elimination: Goal: Will not experience complications related to urinary retention Outcome: Progressing   Problem: Pain Managment: Goal: General experience of comfort will improve Outcome: Progressing   Problem: Safety: Goal: Ability to remain free from injury will improve Outcome: Progressing   

## 2019-02-13 NOTE — Progress Notes (Signed)
Pt at this time resting quietly. Three way Foley with CBI at a fast rate Urine light pink at this time. Maintain current plan of care and continue to monitor Pt closely

## 2019-02-13 NOTE — Progress Notes (Signed)
At 0515 patient's triple lumen urinary catheter became occluded. CBI stopped, catheter hand irrigated. Multiple blood clots removed. CBI briefly started to flow before occluding again. Hand irrigated multiple times with multiple clots removed.  CBI now flowing without problem, color of urine light pink, patient comfortable will continue to monitor.   Urology Charge RN called to bedside to assist with CBI from [phone].

## 2019-02-13 NOTE — Progress Notes (Signed)
  Subjective: Patient required multiple hand irrigations last night. Foley stopped draining this morning and I replaced it with a 20fr. Mild abdomainl pain  Objective: Vital signs in last 24 hours: Temp:  [99 F (37.2 C)-100.7 F (38.2 C)] 100.2 F (37.9 C) (09/08 0700) Pulse Rate:  [83-91] 84 (09/08 0519) Resp:  [18-22] 18 (09/08 0519) BP: (110-161)/(81-98) 140/87 (09/08 0519) SpO2:  [95 %-100 %] 95 % (09/08 0519)  Intake/Output from previous day: 09/07 0701 - 09/08 0700 In: 18906.6 [P.O.:770; I.V.:1445.6; Blood:316; IV Piggyback:100] Out: 29215 [Urine:29215] Intake/Output this shift: Total I/O In: -  Out: 3300 [Urine:3300]  Physical Exam:  General:alert, cooperative and appears stated age GI: soft, non tender, normal bowel sounds, no palpable masses, no organomegaly, no inguinal hernia Male genitalia: no penile lesions or discharge no testicular masses no bladder distension noted Extremities: extremities normal, atraumatic, no cyanosis or edema  Lab Results: Recent Labs    02/12/19 1040 02/12/19 2017 02/13/19 0413  HGB 7.8* 8.2* 7.9*  HCT 24.5* 28.9* 25.2*   BMET Recent Labs    02/12/19 0524 02/13/19 0413  NA 139 137  K 4.3 3.9  CL 105 105  CO2 26 27  GLUCOSE 157* 126*  BUN 41* 34*  CREATININE 2.79* 2.48*  CALCIUM 8.1* 7.7*   No results for input(s): LABPT, INR in the last 72 hours. No results for input(s): LABURIN in the last 72 hours. Results for orders placed or performed during the hospital encounter of 02/09/19  SARS CORONAVIRUS 2 (TAT 6-24 HRS) Nasopharyngeal Nasopharyngeal Swab     Status: None   Collection Time: 02/09/19  4:53 PM   Specimen: Nasopharyngeal Swab  Result Value Ref Range Status   SARS Coronavirus 2 NEGATIVE NEGATIVE Final    Comment: (NOTE) SARS-CoV-2 target nucleic acids are NOT DETECTED. The SARS-CoV-2 RNA is generally detectable in upper and lower respiratory specimens during the acute phase of infection. Negative results  do not preclude SARS-CoV-2 infection, do not rule out co-infections with other pathogens, and should not be used as the sole basis for treatment or other patient management decisions. Negative results must be combined with clinical observations, patient history, and epidemiological information. The expected result is Negative. Fact Sheet for Patients: SugarRoll.be Fact Sheet for Healthcare Providers: https://www.woods-mathews.com/ This test is not yet approved or cleared by the [place] FDA and  has been authorized for detection and/or diagnosis of SARS-CoV-2 by FDA under an Emergency Use Authorization (EUA). This EUA will remain  in effect (meaning this test can be used) for the duration of the COVID-19 declaration under Section 56 4(b)(1) of the Act, 21 U.S.C. section 360bbb-3(b)(1), unless the authorization is terminated or revoked sooner. Performed at [facility], [place] [address] [place], [place] [address]     Studies/Results: No results found.  Assessment/Plan: Gross hematurias likely related to UTI  1. Continue rocephin 2. Continue CBI, wean as tolerated   LOS: 1 day   Nicolette Bang 02/13/2019, 9:43 AM

## 2019-02-13 NOTE — Progress Notes (Signed)
This encounter was created in error - please disregard.

## 2019-02-13 NOTE — Progress Notes (Signed)
Pt's 3 way foley draining at this time with high flow CBI going. Urine color light red. Maintain current plan of care for Pt.

## 2019-02-13 NOTE — Consult Note (Signed)
   [facility] CM Inpatient Consult   02/13/2019  [name] 1950-10-11 [id number]   Patient chart has been reviewed for readmissions less than 30 days and for high risk score, 26%, for unplanned readmissions.  Patient assessed for community [place] Management follow up needs.  Spoke with [name] by telephone. HIPAA verified. Patient had been engaged with [facility] CM services in the past with a pharmacist and Education officer, museum. Explained the nature of the services to him again and verified PCP. Inquired if he may have any community needs. Patient stated that he was not feeling best right now but would appreciate a community RN follow up when he returns home to assess for any needs. Will place referral for [facility] CM community RN follow up.  Of note, [facility] Care Management services does not replace or interfere with any services that are arranged by inpatient case management or social work.  Netta Cedars, MSN, [facility] Liaison Nurse Mobile Phone [phone]  Toll free office [phone]

## 2019-02-13 NOTE — Progress Notes (Signed)
   Vital Signs MEWS/VS Documentation      02/13/2019 0700 02/13/2019 1054 02/13/2019 1248 02/13/2019 2023   MEWS Score:  0  0  1  3   MEWS Score Color:  Green  Green  Green  Yellow   Resp:  -  -  20  18   Pulse:  -  80  99  (!) 101   BP:  -  (!) 142/86  115/87  127/85   Temp:  100.2 F (37.9 C)  -  (!) 101.4 F (38.6 C)  (!) 101.9 F (38.8 C)   O2 Device:  -  -  Room Air  -       PRN Tylenol administered and yellow MEWS protocol initiated.     Kenesha Moshier P Tarris Delbene 02/13/2019,8:28 PM

## 2019-02-13 NOTE — Progress Notes (Signed)
Pt 's three way catheter has clotted of. CBI running at high speed. CBI stopped and foley hand irrigated with NS.Multiple medium sized blood clots removed. CBI then briefly started to run and then stopped and Pt with c/o of fullness and discomfort, CBI stopped and hand irrigated again with multiple clots removed. CBI now flowing at this time. MD paged. Pt comfortable at this time.

## 2019-02-13 NOTE — Progress Notes (Signed)
Pt with c/o bladder spasm  Three way foley with NS CBI going at a fast rate now not running. Foley catheter irrigated with NS and several medium sized clots removed. B/O suppository given per rectum.After foley irrigated CBI now running at a fast speed Monitor pt closely

## 2019-02-13 NOTE — Progress Notes (Signed)
At 0235, Patient's triple lumen urinary catheter became occluded, CBI stopped. Hand irrigated, CBI flowing without problem, color of urine is light pink. RN will continue to monitor. Urology Charge RN called to bedside to assist in hand irrigation from 0235-0300.

## 2019-02-13 NOTE — Treatment Plan (Signed)
Notified by RN of rising temp (38.8) at vitals check. Has been rising throughout day. Sending cultures (urine, blood x2) although suspect urine culture of limited utility given CBI and indwelling catheter. Broadening antibiotics to include ampicillin given history of Enterococcus faecalis UTI. Continue ceftriaxone, which covers multiple other prior urine culture organisms as well.

## 2019-02-13 NOTE — Plan of Care (Signed)

## 2019-02-14 ENCOUNTER — Inpatient Hospital Stay (HOSPITAL_COMMUNITY): Payer: Medicare Other

## 2019-02-14 ENCOUNTER — Other Ambulatory Visit: Payer: Self-pay

## 2019-02-14 DIAGNOSIS — M109 Gout, unspecified: Secondary | ICD-10-CM

## 2019-02-14 LAB — URINE CULTURE: Culture: NO GROWTH

## 2019-02-14 MED ORDER — COLCHICINE 0.6 MG PO TABS
0.6000 mg | ORAL_TABLET | Freq: Two times a day (BID) | ORAL | Status: DC
  Administered 2019-02-14 – 2019-02-18 (×8): 0.6 mg via ORAL
  Filled 2019-02-14 (×8): qty 1

## 2019-02-14 NOTE — Progress Notes (Signed)
  Subjective: Patient noted worsening right knee pain and swelling. Urine light pink on slow drip CBI  Objective: Vital signs in last 24 hours: Temp:  [98.9 F (37.2 C)-101.9 F (38.8 C)] 100.2 F (37.9 C) (09/09 1358) Pulse Rate:  [86-101] 89 (09/09 1358) Resp:  [18-20] 18 (09/09 1358) BP: (114-146)/(70-94) 117/94 (09/09 1358) SpO2:  [94 %-97 %] 96 % (09/09 1358)  Intake/Output from previous day: 09/08 0701 - 09/09 0700 In: 25306.2 [I.V.:906.1; IV Piggyback:400.1] Out: 26075 [Urine:26075] Intake/Output this shift: Total I/O In: 6030 [Other:6030] Out: [id number] [Urine:16375]  Physical Exam:  General:alert, cooperative and appears stated age GI: soft, non tender, normal bowel sounds, no palpable masses, no organomegaly, no inguinal hernia Male genitalia: no penile lesions or discharge no testicular masses no bladder distension noted Extremities: extremities normal, atraumatic, no cyanosis or edema  Lab Results: Recent Labs    02/12/19 1040 02/12/19 2017 02/13/19 0413  HGB 7.8* 8.2* 7.9*  HCT 24.5* 28.9* 25.2*   BMET Recent Labs    02/12/19 0524 02/13/19 0413  NA 139 137  K 4.3 3.9  CL 105 105  CO2 26 27  GLUCOSE 157* 126*  BUN 41* 34*  CREATININE 2.79* 2.48*  CALCIUM 8.1* 7.7*   No results for input(s): LABPT, INR in the last 72 hours. No results for input(s): LABURIN in the last 72 hours. Results for orders placed or performed during the hospital encounter of 02/11/19  Culture, blood (routine x 2)     Status: None (Preliminary result)   Collection Time: 02/13/19  9:14 PM   Specimen: BLOOD  Result Value Ref Range Status   Specimen Description   Final    BLOOD LEFT HAND Performed at [facility] [address], [place], [place] [address]    Special Requests   Final    BOTTLES DRAWN AEROBIC AND ANAEROBIC Blood Culture adequate volume Performed at [facility] [address], [place], [place] [address]    Culture    Final    NO GROWTH < 24 HOURS Performed at [facility] [address], [place], [place] [address]    Report Status PENDING  Incomplete  Culture, blood (routine x 2)     Status: None (Preliminary result)   Collection Time: 02/13/19  9:14 PM   Specimen: BLOOD  Result Value Ref Range Status   Specimen Description   Final    BLOOD RIGHT ANTECUBITAL Performed at [facility] [address] [place], [place] [address]    Special Requests   Final    BOTTLES DRAWN AEROBIC ONLY Blood Culture adequate volume Performed at [facility] [address] [employer], [place] [address]    Culture   Final    NO GROWTH < 24 HOURS Performed at [facility] [address], [place], [place] [address]    Report Status PENDING  Incomplete    Studies/Results: No results found.  Assessment/Plan: Gross hematurias likely related to UTI  1. Continue rocephin 2. CBI to off 3. Consult hospitalist for worsening gout in right knee   LOS: 2 days   Nicolette Bang 02/14/2019, 4:25 PM

## 2019-02-14 NOTE — Progress Notes (Signed)
CBI running at moderate rate.  Urine now pink to yellow.  No clots present.  Patient denies spasms, feeling of fullness.  Patient complains of right knee pain.  States that he has "water taken off it every few months by orthopedic.  Patient has received pain and gout medication with no relief per patient.  Dr. Alyson Ingles paged and notified.  Dr. Alyson Ingles to consult hospitalist for evaluation.

## 2019-02-14 NOTE — Progress Notes (Addendum)
Triple lumen catheter not flowing.  Patient reports "feeling full".  Irrigated with 30 ml NS.  No clots.  CBI moderate flow and urine now emptying into bag.  Urine light pink in color.  Will continue to monitor.

## 2019-02-14 NOTE — Consult Note (Signed)
TRH this is a no charge note  consult  38 AA male BPH retention hematuria admitted initially 02/09/2019 by urology service secondary to gross hematuria 1 L gross clot-had a 86 French three-way catheter placed by Dr. Dreama Saa discharge 9/6 by Dr. Jeffie Pollock only to come back with clot obstruction to the catheter he was in acute distress in the ED and is scheduled to have a simple prostatectomy by Dr. Alyson Ingles 03/2019 It appears in the interim he was given DDAVP conjugated estrogen and 1 unit of packed red blood cells for consistent bleeding he also required significant hand irrigations overnight and had a replacement of the catheter 24 French 9/8  Hospitalist service was consulted this afternoon regarding patient having acute swelling of the knee consistent with his prior episodes of gout  His other past medical history is pertinent for having 20-year plus history of right parotid swelling, CKD 3, HTN, peripheral edema and gout It appears that the last time he was admitted was in 2018 he was placed on a prednisone taper as per orthopedic surgery Impression  Symptomatic gout with large right knee effusion Patient has been seen in the past by [facility] which is the primary care sports medicine practice and does not's come to the hospital therefore spoke with Dr. Alma Friendly of orthopedics who will see the patient in the morning recommends icing the joint AP lateral x-ray knee Given the patient has possible nidus of infection with hematuria and is currently on ceftriaxone and amoxicillin it is probably not a good idea to use steroids at this juncture  The rest of this gentleman's medical conditions are stable at this time we will sign off and will not follow thank you for the consult  Subjective/interval events   In severe pain Dilaudid being given to the patient with reduction in pain from 8/10 downwards Patient tells me he used to be a professor taught at the business school at [employer]  been here for 14 years his main concern right now is the swelling in his right knee, it sounds like the plan from urology perspective Mr. continue CBT until he can make it to Dr. Noland Fordyce outpatient elective surgery that scheduled for him  Objective     BP (!) 117/94 (BP Location: Right Arm)   Pulse 89   Temp 100.2 F (37.9 C) (Oral)   Resp 18   Ht 6\' 1"  (1.854 m)   Wt 110.7 kg   SpO2 96%   BMI 32.20 kg/m      Intake/Output Summary (Last 24 hours) at 02/14/2019 1738 Last data filed at 02/14/2019 1715 Gross per 24 hour  Intake 20289.52 ml  Output 29050 ml  Net -8760.48 ml    GPE Awake alert coherent no distress EOMI NCAT thick neck Mallampati 2 neck soft supple S1-S2 no murmur rub or gallop Chest clear Large effusion right knee which is exquisitely tender Neurologically intact

## 2019-02-14 NOTE — Patient Outreach (Signed)
[facility]) Care Management  02/14/2019  Irwin Rubio [birth year] [id number]   Care coordination:  Referral received from Netta Cedars, [place] hospital liaison to follow post hospital stay for complex case management.  Patient currently inpatient.   PLAN; RNCM will continue to monitor for patients disposition and contact him once discharged from the hospital.   Quinn Plowman RN,BSN,CCM [facility] Telephonic  [phone]

## 2019-02-15 NOTE — Progress Notes (Signed)
Subjective: Patient noted worsening right knee pain and swelling. Ortho consulted and recommended aspiration and cortisone injection  Objective: Vital signs in last 24 hours: Temp:  [99.4 F (37.4 C)-102.3 F (39.1 C)] 99.6 F (37.6 C) (09/10 2017) Pulse Rate:  [89-97] 89 (09/10 2017) Resp:  [20-22] 20 (09/10 2017) BP: (119-150)/(70-87) 144/86 (09/10 2017) SpO2:  [95 %-100 %] 98 % (09/10 2017)  Intake/Output from previous day: 09/09 0701 - 09/10 0700 In: 6983.4 [I.V.:953.4] Out: [id number] [Urine:24375] Intake/Output this shift: Total I/O In: -  Out: 2400 [Urine:2400]  Physical Exam:  General:alert, cooperative and appears stated age GI: soft, non tender, normal bowel sounds, no palpable masses, no organomegaly, no inguinal hernia Male genitalia: no penile lesions or discharge no testicular masses no bladder distension noted Extremities: extremities normal, atraumatic, no cyanosis or edema  Lab Results: Recent Labs    02/13/19 0413  HGB 7.9*  HCT 25.2*   BMET Recent Labs    02/13/19 0413  NA 137  K 3.9  CL 105  CO2 27  GLUCOSE 126*  BUN 34*  CREATININE 2.48*  CALCIUM 7.7*   No results for input(s): LABPT, INR in the last 72 hours. No results for input(s): LABURIN in the last 72 hours. Results for orders placed or performed during the hospital encounter of 02/11/19  Culture, Urine     Status: None   Collection Time: 02/13/19  8:57 PM   Specimen: Urine, Random  Result Value Ref Range Status   Specimen Description   Final    URINE, RANDOM Performed at [facility] [address], [place], [place] [address]    Special Requests   Final    NONE Performed at [facility], [place] [address], [place], [place] [address]    Culture   Final    NO GROWTH Performed at [facility], [place] [address], [place], [place] [address]    Report Status 02/14/2019 FINAL  Final  Culture, blood (routine x 2)     Status: None  (Preliminary result)   Collection Time: 02/13/19  9:14 PM   Specimen: BLOOD  Result Value Ref Range Status   Specimen Description   Final    BLOOD LEFT HAND Performed at [facility] [address], [place], [place] [address]    Special Requests   Final    BOTTLES DRAWN AEROBIC AND ANAEROBIC Blood Culture adequate volume Performed at [facility] [address], [place], [place] [address]    Culture   Final    NO GROWTH < 24 HOURS Performed at [facility] [address], [place], [place] [address]    Report Status PENDING  Incomplete  Culture, blood (routine x 2)     Status: None (Preliminary result)   Collection Time: 02/13/19  9:14 PM   Specimen: BLOOD  Result Value Ref Range Status   Specimen Description   Final    BLOOD RIGHT ANTECUBITAL Performed at [facility] [address] [place], [place] [address]    Special Requests   Final    BOTTLES DRAWN AEROBIC ONLY Blood Culture adequate volume Performed at [facility] [address] [place], [place] [address]    Culture   Final    NO GROWTH < 24 HOURS Performed at [facility] [address], [place], [place] [address]    Report Status PENDING  Incomplete    Studies/Results: Dg Knee 1-2 Views  Right  Result Date: 02/14/2019 CLINICAL DATA:  Right knee swelling and pain EXAM: RIGHT KNEE - 1-2 VIEW COMPARISON:  01/24/2018 FINDINGS: No fracture or malalignment. Mild patellofemoral and medial joint space degenerative change. Moderate degenerative change of the lateral compartment. Small moderate knee effusion. 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   On: 02/14/2019 19:52    Assessment/Plan: Gross hematurias likely related to UTI  1. Continue rocephin 2. CBI to off 3. recs for ortho for right knee 4. Likely Saturday of urine is clear  LOS: 3 days    Nicolette Bang 02/15/2019, 10:22 PM

## 2019-02-15 NOTE — Consult Note (Signed)
[name] is an 68 y.o. male.    Chief Complaint: right knee pain  HPI: 68 y/o male with admission for fever and hematuria. Patient states he has history of knee pain for many years and history of gout. C/o swelling in right knee for the past 7 days with similar history with previous gout attacks. Denies any falls or injuries. Usually has injections at [facility]. Last injection was 4 months ago. Recently had a fever noted from last night.  PCP:  Biagio Borg, MD  PMH: Past Medical History:  Diagnosis Date  . Anemia    normal Fe, nl B12, nl retic, nl EPO July '13  . Blood transfusion without reported diagnosis   . BPH (benign prostatic hyperplasia)   . Chronic back pain   . Chronic kidney disease    CKD III, obstructive nephropathy  . Diverticulosis   . Dysuria   . Elevated PSA, greater than or equal to 20 ng/ml June '13   PSA 107  . Hemorrhoids, internal, with bleeding, prolapse 09/19/2014  . Hyperlipidemia 05/29/2014  . Hypertension   . Obstructive uropathy 11/27/2015  . Renal insufficiency   . Tuberculosis    h/o PPD +  . UTI (urinary tract infection)   . Vitamin D deficiency 09/13/2017    PSes:  No Known Allergies  Medications: Current Facility-Administered Medications  Medication Dose Route Frequency Provider Last Rate Last Dose  . 0.9 %  sodium chloride infusion (Manually program via Guardrails IV Fluids)   Intravenous Once Irine Seal, MD      . 0.9 %  sodium chloride infusion   Intravenous PRN Irine Seal, MD 10 mL/hr at 02/12/19 2240 950 mL at 02/12/19 2240  . acetaminophen (TYLENOL) tablet 650 mg  650 mg Oral Q4H PRN Irine Seal, MD   650 mg at 02/14/19 2143  . allopurinol (ZYLOPRIM) tablet 200 mg  200 mg Oral Daily Irine Seal, MD   200 mg at 02/14/19 0926  . ampicillin (OMNIPEN) 1 g in sodium chloride 0.9 % 100 mL IVPB  1 g Intravenous Q6H Haskel Schroeder, MD 300 mL/hr at 02/15/19 0627 1 g at 02/15/19 0627  . atorvastatin (LIPITOR) tablet 40  mg  40 mg Oral QHS Irine Seal, MD   40 mg at 02/14/19 2141  . bisacodyl (DULCOLAX) suppository 10 mg  10 mg Rectal Daily PRN Irine Seal, MD      . cefTRIAXone (ROCEPHIN) 1 g in sodium chloride 0.9 % 100 mL IVPB  1 g Intravenous QHS Irine Seal, MD 200 mL/hr at 02/14/19 2149 1 g at 02/14/19 2149  . cholecalciferol (VITAMIN D3) tablet 1,000 Units  1,000 Units Oral Daily Irine Seal, MD   1,000 Units at 02/14/19 [phone]  . colchicine tablet 0.6 mg  0.6 mg Oral BID Nita Sells, MD   0.6 mg at 02/14/19 2142  . dextrose 5 % and 0.45 % NaCl with KCl 10 mEq/L infusion   Intravenous Continuous Irine Seal, MD 100 mL/hr at 02/15/19 [phone]    . docusate sodium (COLACE) capsule 100 mg  100 mg Oral BID Irine Seal, MD   100 mg at 02/14/19 2141  . finasteride (PROSCAR) tablet 5 mg  5 mg Oral Daily Irine Seal, MD   5 mg at 02/14/19 0926  . HYDROmorphone (DILAUDID) injection 0.5-1 mg  0.5-1 mg Intravenous Q2H PRN Irine Seal, MD   1 mg at 02/14/19 2136  . metoprolol tartrate (LOPRESSOR) tablet 25 mg  25 mg Oral  BID Irine Seal, MD   25 mg at 02/14/19 2141  . ondansetron (ZOFRAN) injection 4 mg  4 mg Intravenous Q4H PRN Irine Seal, MD      . opium-belladonna (B&O SUPPRETTES) 16.2-60 MG suppository 1 suppository  1 suppository Rectal Q6H PRN Irine Seal, MD   1 suppository at 02/13/19 1446  . oxyCODONE (Oxy IR/ROXICODONE) immediate release tablet 5 mg  5 mg Oral Q4H PRN Irine Seal, MD   5 mg at 02/15/19 0154  . senna-docusate (Senokot-S) tablet 1 tablet  1 tablet Oral QHS PRN Irine Seal, MD      . sodium chloride irrigation 0.9 % 1,000 mL  1,000 mL Irrigation Continuous Dorie Rank, MD      . sodium chloride irrigation 0.9 % 3,000 mL  3,000 mL Irrigation Continuous Irine Seal, MD 0 mL/hr at 02/12/19 0230 3,000 mL at 02/14/19 2151  . sodium phosphate (FLEET) 7-19 GM/118ML enema 1 enema  1 enema Rectal Once PRN Irine Seal, MD      . zolpidem Lorrin Mais) tablet 5 mg  5 mg Oral QHS PRN Irine Seal, MD         Results for orders placed or performed during the hospital encounter of 02/11/19 (from the past 48 hour(s))  Culture, Urine     Status: None   Collection Time: 02/13/19  8:57 PM   Specimen: Urine, Random  Result Value Ref Range   Specimen Description      URINE, RANDOM Performed at [place] [address] [place], [place] [address]    Special Requests      NONE Performed at [facility], [place] [address], [place], [place] [address]    Culture      NO GROWTH Performed at [facility], [place] [address] [place], [place] [address]    Report Status 02/14/2019 FINAL   Culture, blood (routine x 2)     Status: None (Preliminary result)   Collection Time: 02/13/19  9:14 PM   Specimen: BLOOD  Result Value Ref Range   Specimen Description      BLOOD LEFT HAND Performed at [place] [address], [place], [place] [address]    Special Requests      BOTTLES DRAWN AEROBIC AND ANAEROBIC Blood Culture adequate volume Performed at [place] [address], [place], [place] [address]    Culture      NO GROWTH < 24 HOURS Performed at [place] [address], [place], [place] [address]    Report Status PENDING   Culture, blood (routine x 2)     Status: None (Preliminary result)   Collection Time: 02/13/19  9:14 PM   Specimen: BLOOD  Result Value Ref Range   Specimen Description      BLOOD RIGHT ANTECUBITAL Performed at [facility], [place] [address] [place], [place] [address]    Special Requests      BOTTLES DRAWN AEROBIC ONLY Blood Culture adequate volume Performed at [place] [address] [place], [place] [address]    Culture      NO GROWTH < 24 HOURS Performed at [place] [address] [place],  [address]    Report Status PENDING    Dg Knee 1-2 Views Right  Result Date: 02/14/2019 CLINICAL DATA:  Right knee  swelling and pain EXAM: RIGHT KNEE - 1-2 VIEW COMPARISON:  01/24/2018 FINDINGS: No fracture or malalignment. Mild  patellofemoral and medial joint space degenerative change. Moderate degenerative change of the lateral compartment. Small moderate knee effusion. 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   On: 02/14/2019 19:52    ROS: ROS Right knee pain and swelling Recent fever and hematuria  Physical Exam: Alert and oriented 68 y/o male in no acute distress Right knee with large effusion Mild medial and lateral joint line tenderness Decreased rom due to effusion and guarding nv intact distally No rashes or edema  Assessment/Plan Assessment: right knee effusion consistent with gout  Plan: Recommend aspiration and cortisone injection for pain and symptom relief today Pt in agreement F/u with [place] or our office for further symptoms  Procedure: after sterile prep and consent 120ccs of cloudy/turbid fluid aspirated  Pt injected with 3/3/1 marcaine/lidocaine/kenalog for pain relief  Dressing applied  Brad Mikenzi Raysor PA-C, [place] is now [employer] [facility] [address] [place], [place],  [address] Phone: [phone] [email or web address] Facebook  [employer]

## 2019-02-15 NOTE — Progress Notes (Signed)
Pt w/ temp of 102.3 this AM and a yellow MEWS score. No prn tylenol available. RN notified on call urologist, Azucena Fallen. Verbal orders received to cool pt w/ ice packs until next Tylenol dose could be given and monitor for change. Other vitals stable and pt comfortable apart from knee pain.

## 2019-02-15 NOTE — Care Management Important Message (Signed)
Important Message  Patient Details IM Letter given to Dessa Phi RN to present to the Patient Name: [name] MRN: [id number] Date of Birth: 04-21-1951   Medicare Important Message Given:  Yes     Kerin Salen 02/15/2019, 10:20 AM

## 2019-02-16 NOTE — Progress Notes (Signed)
Subjective: Patient notes improvement in right knee pain. Urine clear on slow drip CBI. No suprapubic pain  Objective: Vital signs in last 24 hours: Temp:  [97.9 F (36.6 C)-99.6 F (37.6 C)] 97.9 F (36.6 C) (09/11 0518) Pulse Rate:  [77-89] 77 (09/11 0518) Resp:  [18-20] 18 (09/11 0518) BP: (143-144)/(85-86) 143/85 (09/11 0518) SpO2:  [97 %-98 %] 97 % (09/11 0518)  Intake/Output from previous day: 09/10 0701 - 09/11 0700 In: 2321.9 [P.O.:720; I.V.:801.9; IV Piggyback:799.9] Out: 7975 [Urine:7975] Intake/Output this shift: Total I/O In: 240 [P.O.:240] Out: 1700 [Urine:1700]  Physical Exam:  General:alert, cooperative and appears stated age GI: soft, non tender, normal bowel sounds, no palpable masses, no organomegaly, no inguinal hernia Male genitalia: no penile lesions or discharge no testicular masses no bladder distension noted Extremities: extremities normal, atraumatic, no cyanosis or edema  Lab Results: No results for input(s): HGB, HCT in the last 72 hours. BMET No results for input(s): NA, K, CL, CO2, GLUCOSE, BUN, CREATININE, CALCIUM in the last 72 hours. No results for input(s): LABPT, INR in the last 72 hours. No results for input(s): LABURIN in the last 72 hours. Results for orders placed or performed during the hospital encounter of 02/11/19  Culture, Urine     Status: None   Collection Time: 02/13/19  8:57 PM   Specimen: Urine, Random  Result Value Ref Range Status   Specimen Description   Final    URINE, RANDOM Performed at [place] [address] [place], [place] [address]    Special Requests   Final    NONE Performed at [facility], [place] [address] [place], [place] [address]    Culture   Final    NO GROWTH Performed at [facility], [place] [address] [place], [place] [address]    Report Status 02/14/2019 FINAL  Final  Culture, blood (routine x 2)     Status: None (Preliminary result)   Collection Time: 02/13/19  9:14 PM   Specimen: BLOOD  Result Value Ref Range Status   Specimen Description   Final    BLOOD LEFT HAND Performed at [place] [address] [place], [place] [address]    Special Requests   Final    BOTTLES DRAWN AEROBIC AND ANAEROBIC Blood Culture adequate volume Performed at [place] [address] [place], [place] [address]    Culture   Final    NO GROWTH 3 DAYS Performed at [facility], [place] [address] [place], [place] [address]    Report Status PENDING  Incomplete  Culture, blood (routine x 2)     Status: None (Preliminary result)   Collection Time: 02/13/19  9:14 PM   Specimen: BLOOD  Result Value Ref Range Status   Specimen Description   Final    BLOOD RIGHT ANTECUBITAL Performed at [place] [address], [place], [place] [address]    Special Requests   Final    BOTTLES DRAWN AEROBIC ONLY Blood Culture adequate volume Performed at [place] [address], [place], [place] [address]    Culture   Final    NO GROWTH 3 DAYS Performed at [facility], [place] [address] [place], [place] [address]    Report Status PENDING  Incomplete    Studies/Results: Dg Knee 1-2 Views Right  Result Date: 02/14/2019 CLINICAL DATA:  Right knee swelling and pain EXAM: RIGHT KNEE - 1-2 VIEW COMPARISON:  01/24/2018 FINDINGS: No fracture or malalignment. Mild patellofemoral and medial joint space degenerative change. Moderate degenerative change of the lateral compartment. Small moderate knee effusion. 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   On: 02/14/2019 19:52    Assessment/Plan: Gross hematurias likely related to UTI  1. D/c CBI 2. D/c rocephin 3. Likely discharge Saturday if urine remains clear  LOS: 4 days   Nicolette Bang 02/16/2019, 1:49 PM

## 2019-02-16 NOTE — Progress Notes (Signed)
   Subjective:    Recheck right knee s/p aspiration and injection yesterday  improved pain and range of motion after the injection Pt c/o mild pain in the left knee now that the right knee feels better Pt has dropped his temperature since yesterday No other new symptoms  Patient reports pain as mild.  Objective:   VITALS:   Vitals:   02/15/19 2017 02/16/19 0518  BP: (!) 144/86 (!) 143/85  Pulse: 89 77  Resp: 20 18  Temp: 99.6 F (37.6 C) 97.9 F (36.6 C)  SpO2: 98% 97%    Right knee: improved rom without pain No effusion nv intact distally No rashes or edema Left knee mild effusion Decent rom of left knee without pain  LABS No results for input(s): HGB, HCT, WBC, PLT in the last 72 hours.  No results for input(s): NA, K, BUN, CREATININE, GLUCOSE in the last 72 hours.   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

## 2019-02-17 NOTE — Progress Notes (Signed)
Subjective: The patient finally able to get some sleep, feeling significantly better.  Urine remains clear on a very slow CBI drip.  He is not complaining of any significant pain.  Objective: Vital signs in last 24 hours: Temp:  [98.1 F (36.7 C)-98.4 F (36.9 C)] 98.3 F (36.8 C) (09/12 0611) Pulse Rate:  [66-72] 66 (09/12 0611) Resp:  [14-20] 20 (09/12 0611) BP: (122-147)/(77-90) 122/77 (09/12 0611) SpO2:  [96 %-98 %] 96 % (09/12 0611)  Intake/Output from previous day: 09/11 0701 - 09/12 0700 In: 6055 [P.O.:600; I.V.:2155; IV Piggyback:300] Out: [id number] [Urine:4350] Intake/Output this shift: No intake/output data recorded.  Physical Exam:  General:alert, cooperative and appears stated age GI: soft, non tender, normal bowel sounds, no palpable masses, no organomegaly, no inguinal hernia Male genitalia: no penile lesions or discharge no testicular masses no bladder distension noted Extremities: extremities normal, atraumatic, no cyanosis or edema  Lab Results: No results for input(s): HGB, HCT in the last 72 hours. BMET No results for input(s): NA, K, CL, CO2, GLUCOSE, BUN, CREATININE, CALCIUM in the last 72 hours. No results for input(s): LABPT, INR in the last 72 hours. No results for input(s): LABURIN in the last 72 hours. Results for orders placed or performed during the hospital encounter of 02/11/19  Culture, Urine     Status: None   Collection Time: 02/13/19  8:57 PM   Specimen: Urine, Random  Result Value Ref Range Status   Specimen Description   Final    URINE, RANDOM Performed at [place] [address], [place], [place] [address]    Special Requests   Final    NONE Performed at [facility], [place] [address], [place], [place] [address]    Culture   Final    NO GROWTH Performed at [facility], [place] [address] [place], [place] [address]    Report Status 02/14/2019 FINAL  Final  Culture, blood (routine x 2)      Status: None (Preliminary result)   Collection Time: 02/13/19  9:14 PM   Specimen: BLOOD  Result Value Ref Range Status   Specimen Description   Final    BLOOD LEFT HAND Performed at [place] [address], [place], [place] [address]    Special Requests   Final    BOTTLES DRAWN AEROBIC AND ANAEROBIC Blood Culture adequate volume Performed at [place] [address], [place], [place] [address]    Culture   Final    NO GROWTH 4 DAYS Performed at [facility], [place] [address], [place], [place] [address]    Report Status PENDING  Incomplete  Culture, blood (routine x 2)     Status: None (Preliminary result)   Collection Time: 02/13/19  9:14 PM   Specimen: BLOOD  Result Value Ref Range Status   Specimen Description   Final    BLOOD RIGHT ANTECUBITAL Performed at [place] [address] [place], [place] [address]    Special Requests   Final    BOTTLES DRAWN AEROBIC ONLY Blood Culture adequate volume Performed at [place] [address] [place], [place] [address]    Culture   Final    NO GROWTH 4 DAYS Performed at [facility], [place] [address] [place],  [address]    Report Status PENDING  Incomplete    Studies/Results: No results found.  Assessment/Plan: The patient's hematuria has resolved, which appears to  have been from a urinary tract infection and associated clean intermittent catheterization.  Our plan is to have him stop his continuous bladder irrigation and ambulate today.  We will Hep-Lock his IV fluid sure he has good pain control.  We will plan to send him home tomorrow if he remains clear off of CBI.    LOS: 5 days   Ardis Hughs 02/17/2019, 9:07 AM

## 2019-02-18 LAB — CULTURE, BLOOD (ROUTINE X 2)
Culture: NO GROWTH
Culture: NO GROWTH
Special Requests: ADEQUATE
Special Requests: ADEQUATE

## 2019-02-18 NOTE — Discharge Instructions (Signed)

## 2019-02-18 NOTE — Progress Notes (Signed)
Subjective: CBI has been off overnight and there have been no issues.  The patient is feeling well this morning.  He is tolerating a regular diet.  He is ambulating on his own.  Objective: Vital signs in last 24 hours: Temp:  [97.5 F (36.4 C)-98.4 F (36.9 C)] 97.5 F (36.4 C) (09/13 0518) Pulse Rate:  [64-67] 64 (09/13 0518) Resp:  [15-17] 16 (09/13 0518) BP: (125-144)/(77-85) 131/77 (09/13 0518) SpO2:  [98 %-100 %] 98 % (09/13 0518)  Intake/Output from previous day: 09/12 0701 - 09/13 0700 In: 3506.9 [P.O.:960; I.V.:2246.9; IV Piggyback:300] Out: 2000 [Urine:2000] Intake/Output this shift: No intake/output data recorded.  Physical Exam:  General:alert, cooperative and appears stated age GI: soft, non tender, normal bowel sounds, no palpable masses, no organomegaly, no inguinal hernia Male genitalia: no penile lesions or discharge no testicular masses no bladder distension noted Extremities: extremities normal, atraumatic, no cyanosis or edema  Lab Results: No results for input(s): HGB, HCT in the last 72 hours. BMET No results for input(s): NA, K, CL, CO2, GLUCOSE, BUN, CREATININE, CALCIUM in the last 72 hours. No results for input(s): LABPT, INR in the last 72 hours. No results for input(s): LABURIN in the last 72 hours. Results for orders placed or performed during the hospital encounter of 02/11/19  Culture, Urine     Status: None   Collection Time: 02/13/19  8:57 PM   Specimen: Urine, Random  Result Value Ref Range Status   Specimen Description   Final    URINE, RANDOM Performed at [place] [address] [place], [place] [address]    Special Requests   Final    NONE Performed at [facility], [place] [address], [place], [place] [address]    Culture   Final    NO GROWTH Performed at [facility], [place] [address], [place], [place] [address]    Report Status 02/14/2019 FINAL  Final  Culture, blood (routine x 2)      Status: None   Collection Time: 02/13/19  9:14 PM   Specimen: BLOOD  Result Value Ref Range Status   Specimen Description   Final    BLOOD LEFT HAND Performed at [place] [address], [place], [place] [address]    Special Requests   Final    BOTTLES DRAWN AEROBIC AND ANAEROBIC Blood Culture adequate volume Performed at [place] [address] [place], [place] [address]    Culture   Final    NO GROWTH 5 DAYS Performed at [facility], [place] [address], [place], [place] [address]    Report Status 02/18/2019 FINAL  Final  Culture, blood (routine x 2)     Status: None   Collection Time: 02/13/19  9:14 PM   Specimen: BLOOD  Result Value Ref Range Status   Specimen Description   Final    BLOOD RIGHT ANTECUBITAL Performed at [place] [address], [place], [place] [address]    Special Requests   Final    BOTTLES DRAWN AEROBIC ONLY Blood Culture adequate volume Performed at [place] [address], [place], [place] [address]    Culture   Final    NO GROWTH 5 DAYS Performed at [facility], [place] [address], [place], [place] [address]    Report Status 02/18/2019 FINAL  Final    Studies/Results: No results found.  Assessment/Plan: The patient's hematuria has resolved, which appears  to have been from a urinary tract infection and associated clean intermittent catheterization.   Plan is to discharge the patient home today with follow-up with Dr. Noah Delaine, plan for a robotic-assisted simple prostatectomy in the coming weeks.  He will be discharged home with his Foley catheter.  He will not require antibiotics.   LOS: 6 days   Ardis Hughs 02/18/2019, 11:08 AM

## 2019-02-18 NOTE — Discharge Summary (Signed)
Date of admission: 02/11/2019  Date of discharge: 02/18/2019  Admission diagnosis: Gross hematuria, symptomatic gout in the right knee  Discharge diagnosis: Same  Secondary diagnoses:  Patient Active Problem List   Diagnosis Date Noted  . Clot retention of urine 02/11/2019  . Vitamin D deficiency 09/13/2017  . Urinary tract infection without hematuria 07/23/2017  . Low back pain 07/03/2017  . Dizziness 06/30/2017  . Acute gouty arthritis 12/01/2016  . Degenerative arthritis of knee, bilateral 11/18/2016  . Urinary retention due to benign prostatic hyperplasia 10/24/2016  . Mass of right side of neck 10/20/2016  . Gout 10/20/2016  . Knee pain, acute 10/12/2016  . Hypokalemia 05/28/2016  . Encounter for well adult exam with abnormal findings 11/27/2015  . Obstructive uropathy 11/27/2015  . Elevated PSA 11/27/2015  . Acute pyelonephritis 05/27/2015  . Generalized bloating 05/27/2015  . Constipation 05/27/2015  . Chest pain 05/27/2015  . AKI (acute kidney injury) ([place]) 05/27/2015  . Acute sinus infection 11/22/2014  . Cough 09/25/2014  . Hemorrhoids, internal, with bleeding, prolapse 09/19/2014  . 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 Hyperlipidemia 05/29/2014  . Lumbar radiculopathy 05/23/2014  . Lumbar stenosis 11/20/2013  . Abnormal glucose 11/06/2013  . Unspecified hereditary and idiopathic peripheral neuropathy 01/22/2013  . Cardiomyopathy due to hypertension ([place]) 12/28/2011  . CKD (chronic kidney disease) stage 3, GFR 30-59 ml/min (HCC) 11/24/2011  . Hematuria 11/24/2011  . Hydronephrosis 11/24/2011  . Anemia 11/24/2011  . BRADYCARDIA 02/05/2010  . TINEA PEDIS 01/29/2010  . 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 PERIPHERAL EDEMA 01/29/2010  . ABNORMAL ELECTROCARDIOGRAM 01/29/2010  . 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  . Osteoarthrosis, generalized, 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  . ONYCHOMYCOSIS, TOENAILS 10/02/2007  . BPH (benign prostatic hyperplasia) 10/02/2007  . Essential  hypertension 03/06/2007    Procedures performed:   History and Physical: For full details, please see admission history and physical. Briefly, [name] is a 68 y.o. year old patient with gross hematuria likely related to urinary tract infection and trauma from clean intermittent catheterization.   Hospital Course: The patient was admitted from the emergency department and placed on continuous bladder irrigation.  Over the course of his hospitalization his hematuria improved.  At the time of discharge his urine was clear off of continuous bladder irrigation.  He is being discharged home with a Foley catheter.  In addition the patient had a right knee effusion consistent with gout which was aspirated and then injected by orthopedic surgery.  His knee pain improved significantly while in the hospital.   Laboratory values:  No results for input(s): WBC, HGB, HCT in the last 72 hours. No results for input(s): NA, K, CL, CO2, GLUCOSE, BUN, CREATININE, CALCIUM in the last 72 hours. No results for input(s): LABPT, INR in the last 72 hours. No results for input(s): LABURIN in the last 72 hours. Results for orders placed or performed during the hospital encounter of 02/11/19  Culture, Urine     Status: None   Collection Time: 02/13/19  8:57 PM   Specimen: Urine, Random  Result Value Ref Range Status   Specimen Description   Final    URINE, RANDOM Performed at [facility] [address], [place], [place] [address]    Special Requests   Final    NONE Performed at [facility], [place] [address] [place], [place] [address]    Culture   Final    NO GROWTH Performed at [facility], [place],  [place] [address]    Report Status 02/14/2019 FINAL  Final  Culture, blood (routine x 2)     Status: None   Collection Time: 02/13/19  9:14 PM   Specimen: BLOOD  Result Value Ref Range Status   Specimen Description   Final    BLOOD LEFT  HAND Performed at [facility] [address], [place], [place] [address]    Special Requests   Final    BOTTLES DRAWN AEROBIC AND ANAEROBIC Blood Culture adequate volume Performed at [facility] [address] [place], [place] [address]    Culture   Final    NO GROWTH 5 DAYS Performed at [facility], [place] [address], [place], [place] [address]    Report Status 02/18/2019 FINAL  Final  Culture, blood (routine x 2)     Status: None   Collection Time: 02/13/19  9:14 PM   Specimen: BLOOD  Result Value Ref Range Status   Specimen Description   Final    BLOOD RIGHT ANTECUBITAL Performed at [facility] [address], [place], [place] [address]    Special Requests   Final    BOTTLES DRAWN AEROBIC ONLY Blood Culture adequate volume Performed at [facility] [address], [place], [place] [address]    Culture   Final    NO GROWTH 5 DAYS Performed at [facility], [place] [address] [place],  [address]    Report Status 02/18/2019 FINAL  Final    Disposition: Home  Discharge instruction: The patient was instructed to be ambulatory but told to refrain from heavy lifting, strenuous activity, or driving.   Discharge medications:  Allergies as of 02/18/2019   No Known Allergies     Medication List    TAKE these medications   acetaminophen 500 MG tablet Commonly known as: TYLENOL Take 1,000 mg by mouth every 6 (six) hours as needed (pain).   allopurinol 100 MG tablet Commonly known as: ZYLOPRIM Take 2 tablets (200 mg total) by mouth daily. What changed: how much to take   atorvastatin 40 MG tablet Commonly known as: LIPITOR Take 1 tablet by mouth once daily What changed: when to take this   cholecalciferol 25 MCG (1000 UT) tablet Commonly known as: VITAMIN D3 Take 1,000 Units by mouth daily.   colchicine 0.6 MG tablet Take 0.6 mg by mouth daily as needed (gout).    finasteride 5 MG tablet Commonly known as: PROSCAR TAKE 1 TABLET BY MOUTH ONCE DAILY   FOLIC ACID PO Take 1 tablet by mouth daily.   Hyoscyamine Sulfate SL 0.125 MG Subl Commonly known as: Levsin/SL Place 0.125 mg under the tongue every 6 (six) hours as needed (bladder spasm).   lidocaine 2 % jelly Commonly known as: XYLOCAINE Place 1 application into the urethra as needed. What changed:   when to take this  reasons to take this   metoprolol tartrate 25 MG tablet Commonly known as: LOPRESSOR Take 1 tablet by mouth twice daily What changed: additional instructions   POTASSIUM PO Take 1 tablet by mouth daily.   tamsulosin 0.4 MG Caps capsule Commonly known as: FLOMAX Take 0.4 mg by mouth daily.   TART CHERRY ADVANCED PO Take 1 tablet by mouth daily.       Followup:  Follow-up Information    McKenzie, Candee Furbish, MD On 03/09/2019.   Specialty: Urology Why: Surgery Contact information: [address] [place] [address] [phone]

## 2019-02-18 NOTE — Progress Notes (Signed)
Pt discharge instructions reviewed at bedside. Pt given taxi voucher for discharge home. RN wheeled pt to discharge area to wait for taxi. No questions or concerns at this time.

## 2019-02-19 ENCOUNTER — Ambulatory Visit: Payer: Medicare Other | Admitting: Cardiovascular Disease

## 2019-02-19 ENCOUNTER — Telehealth: Payer: Self-pay | Admitting: *Deleted

## 2019-02-19 ENCOUNTER — Other Ambulatory Visit: Payer: Self-pay

## 2019-02-19 NOTE — Telephone Encounter (Signed)
Pt was on TCM report admitted 02/11/12 for Gross hematuria, symptomatic gout in the right knee. Pt was placed on continuous bladder irrigation.  Over the course of his hospitalization his hematuria improved. Pt D/C 02/18/19  home with a Foley catheter. Pt will follow-up w/urologist Alyson Ingles Candee Furbish, MD On 03/09/2019...Johny Chess

## 2019-02-19 NOTE — Patient Outreach (Signed)
[facility]) Care Management  02/19/2019  Ehren Renteria Sep 01, 1950 [id number]  Transition of care  Referral date: 02/13/19 Referral source: Netta Cedars RN, hospital liaison Diagnosis: Gross hematuria Date of admission: 02/09/19 to 02/11/19 Readmitted 02/11/19 to 02/18/19 Facility : [facility]  Telephone call to patient regarding transition of care referral. HIPAA verified with patient.  Patient states he was readmitted to the hospital due to urinary blockage and bleeding. Patient states he has a preadmission appointment scheduled for 03/05/19 and a prostates procedure on 03/09/19.  Patient states he had a bladder spasm this morning and some tingling in his feet. Upon medication review patient reports he does not have the bladder spasm medication that was ordered for him. He states he was called about the medication after his first admission but due to being readmitted to the hospital he was not able to pick up. RNCM advised patient to contact his pharmacy to request medication to be refilled.  RNCM also advised patient to contact his primary MD office to notify them of his readmission and discharge. Patient verbalized understanding. RNCM discussed discharge paperwork with patient. Discussed with patient when he should seek medical care for symptoms. Patient verbalized understanding.   Patient states she does not have transportation to his appointments. RNCM discussed and offered [facility] care management services. Patient verbalized agreement. RNCM informed patient she will refer him to the [facility] care management social worker to assist him with his transportation needs.   Patient verbally agreed to ongoing follow up with RNCM.   RNCM advised patient to notify MD of any changes in condition prior to scheduled appointment. RNCM provided contact name and number: [phone] or main office number [phone] and 24 hour nurse advise line [phone].  RNCM verified  patient aware of 911 services for urgent/ emergent needs.  PLAN:  RNCM will follow up with patient within 1 week  RNCM will send patient [facility] welcome packet/ consent/ Advanced directive RNCM will send patients primary MD involvement letter.   Quinn Plowman RN,BSN,CCM [facility] Telephonic  [phone]

## 2019-02-20 ENCOUNTER — Other Ambulatory Visit: Payer: Self-pay

## 2019-02-20 NOTE — Patient Outreach (Signed)
[facility]) Care Management  02/20/2019  Keyvion Eschete 06/21/1950 [id number]   Unsuccessful outreach to patient regarding social work referral for transportation resources.  Unable to leave voicemail message.  Mailed unsuccessful outreach letter.  Will attempt to reach again within four business days.  Ronn Melena, BSW Social Worker [phone]

## 2019-02-21 ENCOUNTER — Encounter: Payer: Self-pay | Admitting: Internal Medicine

## 2019-02-21 ENCOUNTER — Ambulatory Visit (INDEPENDENT_AMBULATORY_CARE_PROVIDER_SITE_OTHER): Payer: Medicare Other | Admitting: Internal Medicine

## 2019-02-21 ENCOUNTER — Other Ambulatory Visit: Payer: Self-pay

## 2019-02-21 VITALS — BP 118/78 | HR 93 | Temp 98.1°F | Ht 73.0 in | Wt 245.0 lb

## 2019-02-21 DIAGNOSIS — N183 Chronic kidney disease, stage 3 unspecified: Secondary | ICD-10-CM

## 2019-02-21 DIAGNOSIS — Z23 Encounter for immunization: Secondary | ICD-10-CM | POA: Diagnosis not present

## 2019-02-21 DIAGNOSIS — I10 Essential (primary) hypertension: Secondary | ICD-10-CM

## 2019-02-21 DIAGNOSIS — R7309 Other abnormal glucose: Secondary | ICD-10-CM

## 2019-02-21 DIAGNOSIS — E559 Vitamin D deficiency, unspecified: Secondary | ICD-10-CM | POA: Diagnosis not present

## 2019-02-21 DIAGNOSIS — N390 Urinary tract infection, site not specified: Secondary | ICD-10-CM

## 2019-02-21 DIAGNOSIS — I129 Hypertensive chronic kidney disease with stage 1 through stage 4 chronic kidney disease, or unspecified chronic kidney disease: Secondary | ICD-10-CM

## 2019-02-21 DIAGNOSIS — Z0000 Encounter for general adult medical examination without abnormal findings: Secondary | ICD-10-CM

## 2019-02-21 DIAGNOSIS — M109 Gout, unspecified: Secondary | ICD-10-CM

## 2019-02-21 DIAGNOSIS — E611 Iron deficiency: Secondary | ICD-10-CM

## 2019-02-21 DIAGNOSIS — E538 Deficiency of other specified B group vitamins: Secondary | ICD-10-CM

## 2019-02-21 NOTE — Progress Notes (Signed)
Subjective:    Patient ID: [name], male    DOB: 1950/08/02, 68 y.o.   MRN: [id number]  HPI  Here to f/u recent hospn 9/6 - 9/13 with gross hematuria though releated to UTI and cath trauma, as well as right knee effusion s/p tap with tx for gout. Denies urinary symptoms such as dysuria, frequency, urgency, flank pain, hematuria or n/v, fever, chills.  Right knee pain and swelling somewhat improved mildly so far, but no giveaways or falls.  Has appt with Renal for AKI soon TBD Dr Justin Mend.  Pt denies increased sob or doe, wheezing, orthopnea, PND, increased LE swelling, palpitations, dizziness or syncope, but has had recurring intermittent SSCP, not sure how to characterize except fleeting with mild sob, cardiology appt for yesterday was rescheduled, note 12/2018 stress test neg.  Pt denies new neurological symptoms such as new headache, or facial or extremity weakness or numbness   Pt denies polydipsia, polyuria Past Medical History:  Diagnosis Date  . Anemia    normal Fe, nl B12, nl retic, nl EPO July '13  . Blood transfusion without reported diagnosis   . BPH (benign prostatic hyperplasia)   . Chronic back pain   . Chronic kidney disease    CKD III, obstructive nephropathy  . Diverticulosis   . Dysuria   . Elevated PSA, greater than or equal to 20 ng/ml June '13   PSA 107  . Hemorrhoids, internal, with bleeding, prolapse 09/19/2014  . Hyperlipidemia 05/29/2014  . Hypertension   . Obstructive uropathy 11/27/2015  . Renal insufficiency   . Tuberculosis    h/o PPD +  . UTI (urinary tract infection)   . Vitamin D deficiency 09/13/2017   Past Surgical History:  Procedure Laterality Date  . CARPAL TUNNEL RELEASE Right   . COLONOSCOPY    . HEMORRHOID BANDING    . PROSTATE SURGERY    . SPLENECTOMY      reports that he has quit smoking. His smoking use included cigarettes. He has never used smokeless tobacco. He reports that he does not drink alcohol or use drugs. family history includes  Diabetes in his mother; Hearing loss in his brother; Stroke in his brother. No Known Allergies Current Outpatient Medications on File Prior to Visit  Medication Sig Dispense Refill  . acetaminophen (TYLENOL) 500 MG tablet Take 1,000 mg by mouth every 6 (six) hours as needed (pain).    Marland Kitchen allopurinol (ZYLOPRIM) 100 MG tablet Take 2 tablets (200 mg total) by mouth daily. (Patient taking differently: Take 100 mg by mouth daily. ) 180 tablet 3  . atorvastatin (LIPITOR) 40 MG tablet Take 1 tablet by mouth once daily 90 tablet 0  . cholecalciferol (VITAMIN D3) 25 MCG (1000 UT) tablet Take 1,000 Units by mouth daily.    . colchicine 0.6 MG tablet Take 0.6 mg by mouth daily as needed (gout).     . finasteride (PROSCAR) 5 MG tablet TAKE 1 TABLET BY MOUTH ONCE DAILY (Patient taking differently: Take 5 mg by mouth daily. ) 90 tablet 0  . FOLIC ACID PO Take 1 tablet by mouth daily.    Marland Kitchen Hyoscyamine Sulfate SL (LEVSIN/SL) 0.125 MG SUBL Place 0.125 mg under the tongue every 6 (six) hours as needed (bladder spasm). 30 tablet 2  . lidocaine (XYLOCAINE) 2 % jelly Place 1 application into the urethra as needed. (Patient taking differently: Place 1 application into the urethra daily as needed (pain from disposable catheter). ) 30 mL 0  . metoprolol  tartrate (LOPRESSOR) 25 MG tablet Take 1 tablet by mouth twice daily (Patient taking differently: Take 25 mg by mouth 2 (two) times daily. (BETA BLOCKER)) 180 tablet 0  . Misc Natural Products (TART CHERRY ADVANCED PO) Take 1 tablet by mouth daily.    Marland Kitchen POTASSIUM PO Take 1 tablet by mouth daily.    . tamsulosin (FLOMAX) 0.4 MG CAPS capsule Take 0.4 mg by mouth daily.     No current facility-administered medications on file prior to visit.    Review of Systems  Constitutional: Negative for other unusual diaphoresis or sweats HENT: Negative for ear discharge or swelling Eyes: Negative for other worsening visual disturbances Respiratory: Negative for stridor or other  swelling  Gastrointestinal: Negative for worsening distension or other blood Genitourinary: Negative for retention or other urinary change Musculoskeletal: Negative for other MSK pain or swelling Skin: Negative for color change or other new lesions Neurological: Negative for worsening tremors and other numbness  Psychiatric/Behavioral: Negative for worsening agitation or other fatigue All otherwise neg per pt    Objective:   Physical Exam BP 118/78   Pulse 93   Temp 98.1 F (36.7 C) (Oral)   Ht 6\' 1"  (1.854 m)   Wt 245 lb (111.1 kg)   SpO2 97%   BMI 32.32 kg/m  VS noted,  Constitutional: Pt appears in NAD HENT: Head: NCAT.  Right Ear: External ear normal.  Left Ear: External ear normal.  Eyes: . Pupils are equal, round, and reactive to light. Conjunctivae and EOM are normal Nose: without d/c or deformity Neck: Neck supple. Gross normal ROM Cardiovascular: Normal rate and regular rhythm.   Pulmonary/Chest: Effort normal and breath sounds without rales or wheezing.  Abd:  Soft, NT, ND, + BS, no organomegaly Neurological: Pt is alert. At baseline orientation, motor grossly intact Skin: Skin is warm. No rashes, other new lesions, no LE edema Psychiatric: Pt behavior is normal without agitation  All otherwise neg per pt Lab Results  Component Value Date   WBC 11.5 (H) 02/13/2019   HGB 7.9 (L) 02/13/2019   HCT 25.2 (L) 02/13/2019   PLT 133 (L) 02/13/2019   GLUCOSE 126 (H) 02/13/2019   CHOL 134 06/30/2017   TRIG 67.0 06/30/2017   HDL 52.90 06/30/2017   LDLCALC 68 06/30/2017   ALT 16 06/30/2017   AST 18 06/30/2017   NA 137 02/13/2019   K 3.9 02/13/2019   CL 105 02/13/2019   CREATININE 2.48 (H) 02/13/2019   BUN 34 (H) 02/13/2019   CO2 27 02/13/2019   TSH 1.82 06/30/2017   PSA 9.64 (H) 06/30/2017   INR 1.2 02/09/2019   HGBA1C 5.9 06/30/2017          Assessment & Plan:

## 2019-02-21 NOTE — Patient Instructions (Addendum)
You had the flu shot today  Please continue all other medications as before, and refills have been done if requested.  Please have the pharmacy call with any other refills you may need.  Please continue your efforts at being more active, low cholesterol diet, and weight control  Please keep your appointments with your specialists as you may have planned - Dr Acie Fredrickson, Dr Justin Mend, and Dr Tamala Julian  No further lab work needed today  Please return in 6 months, or sooner if needed, with Lab testing done 3-5 days before

## 2019-02-23 ENCOUNTER — Other Ambulatory Visit: Payer: Self-pay

## 2019-02-23 NOTE — Patient Outreach (Signed)
[facility]) Care Management  02/23/2019  Jenna Steighner 06/06/1951 [id number]  Difficult Case Discussion Date of Review: 02/23/19 Reason:  Hospital readmission PCP: Cathlean Cower  Insurance:[employer]  Patient discussed in Multidisciplinary  team meeting.   Admissions: 02/09/19 to 02/11/19,  Readmitted 02/11/19 to 02/18/19 Disposition: Discharged to home RN CM Follow Up: Discuss side effects with patient regarding Robotic simple prostatectomy and continue transition of care follow up.   Quinn Plowman RN,BSN,CCM [facility] Telephonic  [phone]

## 2019-02-23 NOTE — Patient Outreach (Signed)
[place]) Care Management  02/23/2019  [name] 1951/02/03 [id number]   Successful outreach to patient regarding social work referral for transportation resources.  Patient reports that he currently utilizes city bus, [place], or taxi.  Informed him that he may be eligible for transportation benefits through [employer] and provided him with contact information to verify. Patient does not qualify for SCAT services.  Also, discussed Education officer, community.  Will Mining engineer.  Will follow up within the next two weeks to ensure receipt.  Ronn Melena, BSW Social Worker [phone]

## 2019-02-24 ENCOUNTER — Encounter: Payer: Self-pay | Admitting: Internal Medicine

## 2019-02-24 NOTE — Assessment & Plan Note (Addendum)
Improving, cont current med tx, f/u sport med as planned  Note:  Total time for pt hx, exam, review of record with pt in the room, determination of diagnoses and plan for further eval and tx is > 40 min, with over 50% spent in coordination and counseling of patient including the differential dx, tx, further evaluation and other management of acute gout, UTI, AKI on CKD, HTn, hyperglycemia

## 2019-02-24 NOTE — Assessment & Plan Note (Signed)
With recent acute flare, resolving, cont same tx

## 2019-02-24 NOTE — Assessment & Plan Note (Signed)
stable overall by history and exam, recent data reviewed with pt, and pt to continue medical treatment as before,  to f/u any worsening symptoms or concerns  

## 2019-02-24 NOTE — Assessment & Plan Note (Signed)
For f/u vit d level 

## 2019-02-24 NOTE — Assessment & Plan Note (Signed)
With recent AKI on CKD - for f/u renal as planned

## 2019-02-26 ENCOUNTER — Other Ambulatory Visit: Payer: Self-pay

## 2019-02-26 NOTE — Patient Outreach (Signed)
[place] [facility]) Care Management  02/26/2019   [name] 03/13/1951 [id number]  Initial telephone assessment;  Subjective: Telephone call to patient regarding initial telephone assessment. HIPAA verified.  Patient states he has not fully recovered. He states he continues to have pain and swelling in his legs and knees. Patient states he has noticed only minimal improvement.  Patient states he saw his primary MD last week.  He states he saw his last week. Patient states he reported the swelling and pain in his legs to his doctor. Patient states he has gained approximately 10 lbs within the past 1-2 weeks. Patient states, "I have trouble with gout as well. " Patient states his primary MD advised him to follow up with his orthopedic doctor.  Patient states he has an appointment with his orthopedic within 3 weeks. RNCM advised patient to elevate his legs when sitting.  Advised patient to call his primary MD for worsening symptoms. Patient verbalized understanding.  RNCM advised patient to notify MD of any changes in condition prior to scheduled appointment. RNCM provided contact name and number: [phone] or main office number [phone] and 24 hour nurse advise line [phone].  RNCM verified patient aware of 911 services for urgent/ emergent needs.    Objective: see initial assessment  Current Medications:  Current Outpatient Medications  Medication Sig Dispense Refill  . acetaminophen (TYLENOL) 500 MG tablet Take 1,000 mg by mouth every 6 (six) hours as needed (pain).    Marland Kitchen allopurinol (ZYLOPRIM) 100 MG tablet Take 2 tablets (200 mg total) by mouth daily. (Patient taking differently: Take 100 mg by mouth daily. ) 180 tablet 3  . atorvastatin (LIPITOR) 40 MG tablet Take 1 tablet by mouth once daily 90 tablet 0  . cholecalciferol (VITAMIN D3) 25 MCG (1000 UT) tablet Take 1,000 Units by mouth daily.    . colchicine 0.6 MG tablet Take 0.6 mg by mouth daily as needed  (gout).     . finasteride (PROSCAR) 5 MG tablet TAKE 1 TABLET BY MOUTH ONCE DAILY (Patient taking differently: Take 5 mg by mouth daily. ) 90 tablet 0  . FOLIC ACID PO Take 1 tablet by mouth daily.    Marland Kitchen Hyoscyamine Sulfate SL (LEVSIN/SL) 0.125 MG SUBL Place 0.125 mg under the tongue every 6 (six) hours as needed (bladder spasm). 30 tablet 2  . lidocaine (XYLOCAINE) 2 % jelly Place 1 application into the urethra as needed. (Patient taking differently: Place 1 application into the urethra daily as needed (pain from disposable catheter). ) 30 mL 0  . metoprolol tartrate (LOPRESSOR) 25 MG tablet Take 1 tablet by mouth twice daily (Patient taking differently: Take 25 mg by mouth 2 (two) times daily. (BETA BLOCKER)) 180 tablet 0  . Misc Natural Products (TART CHERRY ADVANCED PO) Take 1 tablet by mouth daily.    Marland Kitchen POTASSIUM PO Take 1 tablet by mouth daily.    . tamsulosin (FLOMAX) 0.4 MG CAPS capsule Take 0.4 mg by mouth daily.     No current facility-administered medications for this visit.     Functional Status:  In your present state of health, do you have any difficulty performing the following activities: 02/26/2019 02/11/2019  Hearing? N N  Vision? N N  Difficulty concentrating or making decisions? N N  Walking or climbing stairs? Y N  Comment - -  Dressing or bathing? N N  Doing errands, shopping? Y N  Preparing Food and eating ? N -  Using the Toilet? N -  In the past six months, have you accidently leaked urine? N -  Do you have problems with loss of bowel control? N -  Managing your Medications? N -  Managing your Finances? N -  Housekeeping or managing your Housekeeping? Y -  Some recent data might be hidden    Fall/Depression Screening: Fall Risk  02/26/2019 07/04/2018 06/30/2017  Falls in the past year? 1 0 Yes  Number falls in past yr: 0 - 1  Injury with Fall? 0 - No  Risk for fall due to : History of fall(s) - -  Follow up Falls prevention discussed - -   PHQ 2/9 Scores  07/04/2018 06/30/2017 06/28/2017 06/28/2017 04/18/2017 10/20/2016 08/04/2016  PHQ - 2 Score 0 0 1 1 0 0 0   THN CM Care Plan Problem One     Most Recent Value  Care Plan Problem One  potential for readmission due to recent hospitalization  Role Documenting the Problem One  Care Management Telephonic [place] for Problem One  Active  THN Long Term Goal   patient will report no hospital readmission within 45 days  THN Long Term Goal Start Date  02/26/19  Interventions for Problem One Long Term Goal  RNCM advised patient to keep follow up appointments, RNCM advised patient to call primary MD for symptoms.     [facility] CM Care Plan Problem Two     Most Recent Value  Care Plan Problem Two  Knowledge deficit related to simple prostatectomy  Role Documenting the Problem Two  Care Management Telephonic Coordinator  Care Plan for Problem Two  Active  THN CM Short Term Goal #1   Patient wil be able to verbalize contact information for provider post surgical procedure.    THN CM Short Term Goal #1 Start Date  02/26/19  Interventions for Short Term Goal #2   RNCM advised patient to call the surgeon's office for symptoms postoperatively.  RNCM provided patient with 24 hour nurse advise line number       Assessment: ongoing transition of care   Plan: RNCM will follow up with patient within 1 week RNCM will send patients primary MD initial assessment   Quinn Plowman RN,BSN,CCM [facility] Telephonic  [phone]

## 2019-03-01 NOTE — Patient Instructions (Addendum)
DUE TO COVID-19 ONLY ONE VISITOR IS ALLOWED TO COME WITH YOU AND STAY IN THE WAITING ROOM ONLY DURING PRE OP AND PROCEDURE DAY OF SURGERY. THE 1 VISITOR MAY VISIT WITH YOU AFTER SURGERY IN YOUR PRIVATE ROOM DURING VISITING HOURS ONLY!  YOU NEED TO HAVE A COVID 19 TEST ON 03-06-2019 AT  220 pm , THIS TEST MUST BE DONE BEFORE SURGERY, COME  [place], [address] , [address].  ([place]) ONCE YOUR COVID TEST IS COMPLETED, PLEASE BEGIN THE QUARANTINE INSTRUCTIONS AS OUTLINED IN YOUR HANDOUT.                [name]    Your procedure is scheduled on: 03-09-2019   Report to [facility] Main  Entrance              Report to  SHORT STAY at 530  AM     Call this number if you have problems the morning of surgery [phone]    Remember:  [place], NO CHEWING GUM [place].   CLEAR LIQUIDS ALL DAY 03-08-2019, NO CLEAR LIQUIDS AFTER MIDNIGHT.                FOLLOW ALL BOWEL PREP INSTRUCTIONS FROM DR Noah Delaine                            The day before surgery , Drink one bottle Magnesium Citrate by noon and drink clear liquids all day up until midnight then nothing until after surgery!   CLEAR LIQUID DIET   Foods Allowed                                                                     Foods Excluded  Coffee and tea, regular and decaf                             liquids that you cannot  Plain Jell-O any favor except red or purple                                           see through such as: Fruit ices (not with fruit pulp)                                     milk, soups, orange juice  Iced Popsicles                                    All solid food Carbonated beverages, regular and diet                                    Cranberry, grape and apple juices Sports drinks like Gatorade Lightly seasoned clear broth or consume(fat free) Sugar, honey syrup  Sample Menu Breakfast                                 Lunch                                     Supper Cranberry juice                    Beef broth                            Chicken broth Jell-O                                     Grape juice                           Apple juice Coffee or tea                        Jell-O                                      Popsicle                                                Coffee or tea                        Coffee or tea  _____________________________________________________________________     Take these medicines the morning of surgery with A SIP OF WATER: METOPROLOL TARTRATE (Lopressor), ALLOPURINOL (Zyloprim), FINASTERIDE (PROSCAR)                                You may not have any metal on your body including hair pins and              piercings  Do not wear jewelry, make-up, lotions, powders or perfumes, deodorant              Men may shave face and neck.   Do not bring valuables to the hospital. [place].  Contacts, dentures or bridgework may not be worn into surgery.  Leave suitcase in the car. After surgery it may be brought to your room.                   Please read over the following fact sheets you were given: _____________________________________________________________________             [facility] - Preparing for Surgery Before surgery, you can play an important role.  Because skin is not sterile, your skin needs to be as free of germs as possible.  You can reduce the number of germs on your skin by washing with CHG (chlorahexidine gluconate) soap before surgery.  CHG is an antiseptic cleaner  which kills germs and bonds with the skin to continue killing germs even after washing. Please DO NOT use if you have an allergy to CHG or antibacterial soaps.  If your skin becomes reddened/irritated stop using the CHG and inform your nurse when you arrive at Short Stay. Do not shave (including legs and underarms) for at  least 48 hours prior to the first CHG shower.  You may shave your face/neck. Please follow these instructions carefully:  1.  Shower with CHG Soap the night before surgery and the  morning of Surgery.  2.  If you choose to wash your hair, wash your hair first as usual with your  normal  shampoo.  3.  After you shampoo, rinse your hair and body thoroughly to remove the  shampoo.                           4.  Use CHG as you would any other liquid soap.  You can apply chg directly  to the skin and wash                       Gently with a scrungie or clean washcloth.  5.  Apply the CHG Soap to your body ONLY FROM THE NECK DOWN.   Do not use on face/ open                           Wound or open sores. Avoid contact with eyes, ears mouth and genitals (private parts).                       Wash face,  Genitals (private parts) with your normal soap.             6.  Wash thoroughly, paying special attention to the area where your surgery  will be performed.  7.  Thoroughly rinse your body with warm water from the neck down.  8.  DO NOT shower/wash with your normal soap after using and rinsing off  the CHG Soap.                9.  Pat yourself dry with a clean towel.            10.  Wear clean pajamas.            11.  Place clean sheets on your bed the night of your first shower and do not  sleep with pets. Day of Surgery : Do not apply any lotions/deodorants the morning of surgery.  Please wear clean clothes to the hospital/surgery center.  FAILURE TO FOLLOW THESE INSTRUCTIONS MAY RESULT IN THE CANCELLATION OF YOUR SURGERY PATIENT SIGNATURE_________________________________  NURSE SIGNATURE__________________________________  ________________________________________________________________________  WHAT IS A BLOOD TRANSFUSION? Blood Transfusion Information  A transfusion is the replacement of blood or some of its parts. Blood is made up of multiple cells which provide different functions.  Red  blood cells carry oxygen and are used for blood loss replacement.  White blood cells fight against infection.  Platelets control bleeding.  Plasma helps clot blood.  Other blood products are available for specialized needs, such as hemophilia or other clotting disorders. BEFORE THE TRANSFUSION  Who gives blood for transfusions?   Healthy volunteers who are fully evaluated to make sure their blood is safe. This is blood bank blood. Transfusion therapy is the safest it has  ever been in the practice of medicine. Before blood is taken from a donor, a complete history is taken to make sure that person has no history of diseases nor engages in risky social behavior (examples are intravenous drug use or sexual activity with multiple partners). The donor's travel history is screened to minimize risk of transmitting infections, such as malaria. The donated blood is tested for signs of infectious diseases, such as HIV and hepatitis. The blood is then tested to be sure it is compatible with you in order to minimize the chance of a transfusion reaction. If you or a relative donates blood, this is often done in anticipation of surgery and is not appropriate for emergency situations. It takes many days to process the donated blood. RISKS AND COMPLICATIONS Although transfusion therapy is very safe and saves many lives, the main dangers of transfusion include:   Getting an infectious disease.  Developing a transfusion reaction. This is an allergic reaction to something in the blood you were given. Every precaution is taken to prevent this. The decision to have a blood transfusion has been considered carefully by your caregiver before blood is given. Blood is not given unless the benefits outweigh the risks. AFTER THE TRANSFUSION  Right after receiving a blood transfusion, you will usually feel much better and more energetic. This is especially true if your red blood cells have gotten low (anemic). The  transfusion raises the level of the red blood cells which carry oxygen, and this usually causes an energy increase.  The nurse administering the transfusion will monitor you carefully for complications. HOME CARE INSTRUCTIONS  No special instructions are needed after a transfusion. You may find your energy is better. Speak with your caregiver about any limitations on activity for underlying diseases you may have. SEEK MEDICAL CARE IF:   Your condition is not improving after your transfusion.  You develop redness or irritation at the intravenous (IV) site. SEEK IMMEDIATE MEDICAL CARE IF:  Any of the following symptoms occur over the next 12 hours:  Shaking chills.  You have a temperature by mouth above 102 F (38.9 C), not controlled by medicine.  Chest, back, or muscle pain.  People around you feel you are not acting correctly or are confused.  Shortness of breath or difficulty breathing.  Dizziness and fainting.  You get a rash or develop hives.  You have a decrease in urine output.  Your urine turns a dark color or changes to pink, red, or brown. Any of the following symptoms occur over the next 10 days:  You have a temperature by mouth above 102 F (38.9 C), not controlled by medicine.  Shortness of breath.  Weakness after normal activity.  The white part of the eye turns yellow (jaundice).  You have a decrease in the amount of urine or are urinating less often.  Your urine turns a dark color or changes to pink, red, or brown. Document Released: 05/21/2000 Document Revised: 08/16/2011 Document Reviewed: 01/08/2008 [facility] Patient Information 2014 [place], [place].  _______________________________________________________________________

## 2019-03-05 ENCOUNTER — Encounter (HOSPITAL_COMMUNITY)
Admission: RE | Admit: 2019-03-05 | Discharge: 2019-03-05 | Disposition: A | Discharge status: Home / Self Care | Payer: Medicare Other | Source: Ambulatory Visit | Attending: Urology | Admitting: Urology

## 2019-03-05 ENCOUNTER — Encounter (HOSPITAL_COMMUNITY): Payer: Self-pay

## 2019-03-05 ENCOUNTER — Other Ambulatory Visit: Payer: Self-pay

## 2019-03-05 DIAGNOSIS — Z79899 Other long term (current) drug therapy: Secondary | ICD-10-CM | POA: Diagnosis not present

## 2019-03-05 DIAGNOSIS — I129 Hypertensive chronic kidney disease with stage 1 through stage 4 chronic kidney disease, or unspecified chronic kidney disease: Secondary | ICD-10-CM | POA: Diagnosis not present

## 2019-03-05 DIAGNOSIS — Z87891 Personal history of nicotine dependence: Secondary | ICD-10-CM | POA: Insufficient documentation

## 2019-03-05 DIAGNOSIS — D649 Anemia, unspecified: Secondary | ICD-10-CM | POA: Diagnosis not present

## 2019-03-05 DIAGNOSIS — E785 Hyperlipidemia, unspecified: Secondary | ICD-10-CM | POA: Insufficient documentation

## 2019-03-05 DIAGNOSIS — Z01812 Encounter for preprocedural laboratory examination: Secondary | ICD-10-CM | POA: Insufficient documentation

## 2019-03-05 DIAGNOSIS — N183 Chronic kidney disease, stage 3 (moderate): Secondary | ICD-10-CM | POA: Diagnosis not present

## 2019-03-05 DIAGNOSIS — Z7901 Long term (current) use of anticoagulants: Secondary | ICD-10-CM | POA: Diagnosis not present

## 2019-03-05 DIAGNOSIS — Z7982 Long term (current) use of aspirin: Secondary | ICD-10-CM | POA: Diagnosis not present

## 2019-03-05 DIAGNOSIS — D291 Benign neoplasm of prostate: Secondary | ICD-10-CM | POA: Insufficient documentation

## 2019-03-05 LAB — BASIC METABOLIC PANEL
Anion gap: 11 (ref 5–15)
BUN: 37 mg/dL — ABNORMAL HIGH (ref 8–23)
CO2: 27 mmol/L (ref 22–32)
Calcium: 8.9 mg/dL (ref 8.9–10.3)
Chloride: 103 mmol/L (ref 98–111)
Creatinine, Ser: 2.45 mg/dL — ABNORMAL HIGH (ref 0.61–1.24)
GFR calc Af Amer: 30 mL/min — ABNORMAL LOW (ref >60)
GFR calc non Af Amer: 26 mL/min — ABNORMAL LOW (ref >60)
Glucose, Bld: 114 mg/dL — ABNORMAL HIGH (ref 70–99)
Potassium: 3.8 mmol/L (ref 3.5–5.1)
Sodium: 141 mmol/L (ref 135–145)

## 2019-03-05 LAB — CBC
HCT: 27.4 % — ABNORMAL LOW (ref 39.0–52.0)
Hemoglobin: 8 g/dL — ABNORMAL LOW (ref 13.0–17.0)
MCH: 27.1 pg (ref 26.0–34.0)
MCHC: 29.2 g/dL — ABNORMAL LOW (ref 30.0–36.0)
MCV: 92.9 fL (ref 80.0–100.0)
Platelets: 440 10*3/uL — ABNORMAL HIGH (ref 150–400)
RBC: 2.95 MIL/uL — ABNORMAL LOW (ref 4.22–5.81)
RDW: 14.3 % (ref 11.5–15.5)
WBC: 6.5 10*3/uL (ref 4.0–10.5)
nRBC: 0 % (ref 0.0–0.2)

## 2019-03-05 LAB — GLUCOSE, CAPILLARY: Glucose-Capillary: 138 mg/dL — ABNORMAL HIGH (ref 70–99)

## 2019-03-05 LAB — HEMOGLOBIN A1C
Hgb A1c MFr Bld: 5.1 % (ref 4.8–5.6)
Mean Plasma Glucose: 99.67 mg/dL

## 2019-03-05 NOTE — Progress Notes (Signed)
PCP - Dr. Cathlean Cower Cardiologist - Dr. Cleatrice Burke   LOV- 11/29/2018  Chest x-ray - 11/04/2018 EKG - 11/04/2018 Stress Test -12/14/2018  Epic ECHO - 10/24/2016 Cardiac Cath - none  Sleep Study -N/A  CPAP -   Fasting Blood Sugar - does not know as does not check blood glucose Checks Blood Sugar _0____ times a day ( Rarely)  Blood Thinner Instructions:Aspirin 81 mg Aspirin Instructions:was told by Dr. Alyson Ingles when he was in the hospital 02/11/2019 through 02/18/2019 to stop aspirin when he was discharged to home  Admitted to hospital 02/09/2019 through 02/11/2019 for hematuria. Last Dose:3 weeks ago-02/18/2019  Anesthesia review:  Chart given to Iver Nestle, PA to review  Patient denies shortness of breath, fever, cough and chest pain at PAT appointment   Patient verbalized understanding of instructions that were given to them at the PAT appointment. Patient was also instructed that they will need to review over the PAT instructions again at home before surgery.

## 2019-03-06 ENCOUNTER — Other Ambulatory Visit: Payer: Self-pay

## 2019-03-06 ENCOUNTER — Other Ambulatory Visit (HOSPITAL_COMMUNITY)
Admission: RE | Admit: 2019-03-06 | Discharge: 2019-03-06 | Disposition: A | Discharge status: Home / Self Care | Payer: Medicare Other | Source: Ambulatory Visit | Attending: Urology | Admitting: Urology

## 2019-03-06 DIAGNOSIS — Z01812 Encounter for preprocedural laboratory examination: Secondary | ICD-10-CM | POA: Insufficient documentation

## 2019-03-06 DIAGNOSIS — Z20828 Contact with and (suspected) exposure to other viral communicable diseases: Secondary | ICD-10-CM | POA: Diagnosis not present

## 2019-03-06 NOTE — Progress Notes (Signed)
Anesthesia Chart Review   Case: [id number] Date/Time: 03/09/19 [id number]   Procedure: XI ROBOTIC ASSISTED SIMPLE PROSTATECTOMY (N/A )   Anesthesia type: General   Pre-op diagnosis: BENIGN PROSTATIC HYPERPLASIA   Location: [facility] ROOM 03 / [facility]   Surgeon: Cleon Gustin, MD      DISCUSSION:68 y.o. former smoker (quit 02/26/80) with h/o HTN, CKD III, HLD, BHP scheduled for above procedure 03/09/2019 with Dr. Nicolette Bang.   Admission 9/6-9/13/2020 with gross hematuria due to prostatic bleeding.    Last seen by cardiologist, Dr. Mertie Moores, 11/29/2018 for evaluation following ED visit for chest pain and pressure.  [place] 12/14/2018 with no ischemia or infarction, EF 38%.  Per Dr. Acie Fredrickson on results, "Will continue to follow.  We need to get a recent BP reading.  He has a follow up visit in Sept."  Pt without follow up in September due to ED visits and admission for hematuria.    Dr. Acie Fredrickson contacted for input regarding lexiscan results and cardiac function.  Per Dr. Acie Fredrickson, "The patient had a Myoview study which revealed an ejection fraction of 38. In reviewing the Myoview study comparing it to the echo that was done in 2018, I think that the LV function by nuclear study was erroneously low. His left ventricular systolic function by echo was actually normal. Mild apical thickening did not appear to be consistent with ischemia and in fact the apex contracted quite well on the echocardiogram.   I have called the patient and talked with him by phone tonight. He is not having any episodes of chest pain.   I think that he will be safe and having his surgery. He is having lots of bleeding and discomfort and really cannot wait much longer to have this prostate issue fixed. He is at low risk for his upcoming surgery.   The cardiology team is available to see him postoperatively if needed for any complications. Please call the cardiology card master for consults."  Anticipate pt can  proceed with planned procedure barring acute status change.   VS: BP (!) 161/86   Pulse 88   Temp 36.7 C (Oral)   Resp 18   Ht 6\' 1"  (1.854 m)   Wt 113.2 kg   SpO2 97%   BMI 32.92 kg/m   PROVIDERS: Biagio Borg, MD is PCP   Mertie Moores, MD is Cardiologist  LABS: Labs reviewed: Acceptable for surgery. (all labs ordered are listed, but only abnormal results are displayed)  Labs Reviewed  GLUCOSE, CAPILLARY - Abnormal; Notable for the following components:      Result Value   Glucose-Capillary 138 (*)    All other components within normal limits  BASIC METABOLIC PANEL - Abnormal; Notable for the following components:   Glucose, Bld 114 (*)    BUN 37 (*)    Creatinine, Ser 2.45 (*)    GFR calc non Af Amer 26 (*)    GFR calc Af Amer 30 (*)    All other components within normal limits  CBC - Abnormal; Notable for the following components:   RBC 2.95 (*)    Hemoglobin 8.0 (*)    HCT 27.4 (*)    MCHC 29.2 (*)    Platelets 440 (*)    All other components within normal limits  HEMOGLOBIN A1C     IMAGES: Chest Xray 11/04/2018 FINDINGS: Stable cardiomegaly. Minor basilar atelectasis. No focal pneumonia, collapse or consolidation. Negative for edema, effusion or pneumothorax. Trachea midline. Aorta is  ectatic and tortuous as before. Postop changes in the upper abdomen.  IMPRESSION: Stable chest exam. No interval change or superimposed acute process.  EKG: 11/10/2018 Rate 63 bpm Normal sinus rhythm  Right bundle branch block  Left anterior fascicular block  Moderate voltage criteria for LVH, may be normal variant T wave abnormality, consider lateral ischemia  CV: Myocardial Perfusion Imaging 12/14/2018  Nuclear stress EF: 38%.  There was no ST segment deviation noted during stress.  Defect 1: There is a small defect of severe severity present in the apex location.  This is an intermediate risk study.  The left ventricular ejection fraction is moderately  decreased (30-44%).   Abnormal, intermediate risk stress nuclear study with no ischemia or infarction.  Gated ejection fraction 38% with global hypokinesis and moderate left ventricular enlargement.  Study interpreted as intermediate risk due to reduced LV function.  Echo 10/24/2016 Study Conclusions  - Left ventricle: The cavity size was normal. There was mild   concentric hypertrophy. Systolic function was normal. The   estimated ejection fraction was in the range of 55% to 60%. Wall   motion was normal; there were no regional wall motion   abnormalities. Doppler parameters are consistent with abnormal   left ventricular relaxation (grade 1 diastolic dysfunction). - Aortic valve: There was moderate regurgitation directed   eccentrically in the LVOT and towards the mitral anterior   leaflet. - Left atrium: The atrium was mildly dilated. - Atrial septum: No defect or patent foramen ovale was identified. Past Medical History:  Diagnosis Date  . Anemia    normal Fe, nl B12, nl retic, nl EPO July '13  . Blood transfusion without reported diagnosis   . BPH (benign prostatic hyperplasia)   . Chronic back pain   . Chronic kidney disease    CKD III, obstructive nephropathy  . Diverticulosis   . Dysuria   . Elevated PSA, greater than or equal to 20 ng/ml June '13   PSA 107  . Hemorrhoids, internal, with bleeding, prolapse 09/19/2014  . Hyperlipidemia 05/29/2014  . Hypertension   . Obstructive uropathy 11/27/2015  . Renal insufficiency   . Tuberculosis    h/o PPD +  . UTI (urinary tract infection)   . Vitamin D deficiency 09/13/2017    Past Surgical History:  Procedure Laterality Date  . CARPAL TUNNEL RELEASE Right   . COLONOSCOPY    . HEMORRHOID BANDING    . SPLENECTOMY      MEDICATIONS: . acetaminophen (TYLENOL) 500 MG tablet  . allopurinol (ZYLOPRIM) 100 MG tablet  . aspirin EC 81 MG tablet  . atorvastatin (LIPITOR) 40 MG tablet  . cholecalciferol (VITAMIN D3) 25 MCG  (1000 UT) tablet  . colchicine 0.6 MG tablet  . finasteride (PROSCAR) 5 MG tablet  . furosemide (LASIX) 20 MG tablet  . Hyoscyamine Sulfate SL (LEVSIN/SL) 0.125 MG SUBL  . lidocaine (XYLOCAINE) 2 % jelly  . metoprolol tartrate (LOPRESSOR) 25 MG tablet  . Misc Natural Products (TART CHERRY ADVANCED PO)  . Potassium 99 MG TABS  . tamsulosin (FLOMAX) 0.4 MG CAPS capsule   No current facility-administered medications for this encounter.      Maia Plan [facility] Pre-Surgical Testing [phone] 03/07/19  9:42 AM

## 2019-03-06 NOTE — Patient Outreach (Signed)
[facility]) Care Management  03/06/2019  Vahn Hernandezgarci [birth year] [id number]  Transition of care  Referral date: 02/13/19 Referral source: Netta Cedars RN, hospital liaison Diagnosis: Gross hematuria Date of admission: 02/09/19 to 02/11/19 Readmitted 02/11/19 to 02/18/19 Facility : [place]: [place] [facility]  Telephone call to patient for transition of care follow up.  HIPAA verified. Patient states he is doing ok.  Patient states, "I am just trying to recover from this ordeal."  Patient states he did his preadmission testing on yesterday and is currently headed to another appointment.  Patient states he has to call Dr. Noland Fordyce office today. He states he is scheduled to have his prostectomy on 03/09/19.  Patient unable to talk further with RNCM due to having an appointment. Patient verbally agreed to next follow up with RNCM.   PLAN; RNCM will follow up with patient within 4 business days.   Quinn Plowman RN,BSN,CCM [facility] Telephonic  [phone]

## 2019-03-07 ENCOUNTER — Other Ambulatory Visit: Payer: Self-pay

## 2019-03-07 ENCOUNTER — Telehealth: Payer: Self-pay | Admitting: Cardiovascular Disease

## 2019-03-07 ENCOUNTER — Encounter: Payer: Self-pay | Admitting: Cardiovascular Disease

## 2019-03-07 LAB — NOVEL CORONAVIRUS, NAA (HOSP ORDER, SEND-OUT TO REF LAB; TAT 18-24 HRS): SARS-CoV-2, NAA: NOT DETECTED

## 2019-03-07 NOTE — Telephone Encounter (Signed)
Note from Anesthesia  Donald Felix, PA   Hi! I am reviewing the chart of [name] for anesthesia. He is scheduled 03/09/2019 for prostatectomy. Nuclear stress EF: 38% on myocardial perfusion 12/14/2018. On results you recommend a follow up with you in September, which he has not had. He has been in the ED and admitted over the last two months with gross hematuria. Is he ok to proceed with planned procedure from a cardiac standpoint? I appreciate your input   Notes from Mertie Moores, MD :   The patient had a Myoview study which revealed an ejection fraction of 38. In reviewing the Myoview study comparing it to the echo that was done in 2018, I think that the LV function by nuclear study was erroneously low. His left ventricular systolic function by echo was actually normal. Mild apical thickening did not appear to be consistent with ischemia and in fact the apex contracted quite well on the echocardiogram.   I have called the patient and talked with him by phone tonight. He is not having any episodes of chest pain.   I think that he will be safe and having his surgery. He is having lots of bleeding and discomfort and really cannot wait much longer to have this prostate issue fixed. He is at low risk for his upcoming surgery.   The cardiology team is available to see him postoperatively if needed for any complications. Please call the cardiology card master for consults.     Mertie Moores, MD  03/07/2019 9:23 AM    [address],  [place] [address] [place], [place]  [address] Pager [phone] Phone: [phone]; Fax: [phone]

## 2019-03-07 NOTE — Patient Outreach (Signed)
[place] [facility]) Care Management  03/07/2019  [name] November 17, 1950 [id number]  Transition of care  Referral date:02/13/19 Referral source:Netta Cedars RN, hospital liaison Diagnosis:Gross hematuria Date of admission:02/09/19 to 02/11/19 Readmitted 02/11/19 to 02/18/19 Facility: [facility] Insurance:[employer]  Telephone call to patient for transition of care follow up. HIPAA verified. RNCM discussed post surgical symptoms with patient.  Patient verbalized understanding.  RNCM discussed with patient how to contact his doctor for post surgical symptoms if needed.  Patient states he was advised by his doctor that he would be in the hospital 1-2 days post surgical procedure.  Patient states he is doing ok and does not have any further questions.  RNCM informed patient new RNCM would contact him for follow up after his discharge.   PLAN; RNCM will follow up with patient within 1 week.   Quinn Plowman RN,BSN,CCM [facility] Telephonic  [phone]

## 2019-03-07 NOTE — Anesthesia Preprocedure Evaluation (Addendum)
Anesthesia Evaluation  Patient identified by MRN, date of birth, ID band Patient awake    Reviewed: Allergy & Precautions, NPO status , Patient's Chart, lab work & pertinent test results  History of Anesthesia Complications Negative for: history of anesthetic complications  Airway Mallampati: II  TM Distance: >3 FB Neck ROM: Full    Dental  (+) Dental Advisory Given   Pulmonary neg recent URI, former smoker,    breath sounds clear to auscultation       Cardiovascular hypertension,  Rhythm:Regular  Last seen by cardiologist, Dr. Mertie Moores, 11/29/2018 for evaluation following ED visit for chest pain and pressure.  [place] 12/14/2018 with no ischemia or infarction, EF 38%.  Per Dr. Acie Fredrickson on results, "Will continue to follow.  We need to get a recent BP reading.  He has a follow up visit in Sept."  Pt without follow up in September due to ED visits and admission for hematuria.    Dr. Acie Fredrickson contacted for input regarding lexiscan results and cardiac function.  Per Dr. Acie Fredrickson, "The patient had a Myoview study which revealed an ejection fraction of 38. In reviewing the Myoview study comparing it to the echo that was done in 2018, I think that the LV function by nuclear study was erroneously low. His left ventricular systolic function by echo was actually normal. Mild apical thickening did not appear to be consistent with ischemia and in fact the apex contracted quite well on the echocardiogram.   I have called the patient and talked with him by phone tonight. He is not having any episodes of chest pain.   I think that he will be safe and having his surgery. He is having lots of bleeding and discomfort and really cannot wait much longer to have this prostate issue fixed. He is at low risk for his upcoming surgery.   The cardiology team is available to see him postoperatively if needed for any complications. Please call the  cardiology card master for consults."   Neuro/Psych  Neuromuscular disease negative psych ROS   GI/Hepatic negative GI ROS, Neg liver ROS,   Endo/Other  Morbid obesity  Renal/GU CRFRenal disease     Musculoskeletal  (+) Arthritis ,   Abdominal   Peds  Hematology  (+) anemia , Hgb 8   Anesthesia Other Findings Left ventricle: The cavity size was normal. There was mild   concentric hypertrophy. Systolic function was normal. The   estimated ejection fraction was in the range of 55% to 60%. Wall   motion was normal; there were no regional wall motion   abnormalities. Doppler parameters are consistent with abnormal   left ventricular relaxation (grade 1 diastolic dysfunction). - Aortic valve: There was moderate regurgitation directed   eccentrically in the LVOT and towards the mitral anterior   leaflet. - Left atrium: The atrium was mildly dilated. - Atrial septum: No defect or patent foramen ovale was identified.  Reproductive/Obstetrics                           Anesthesia Physical Anesthesia Plan  ASA: III  Anesthesia Plan: General   Post-op Pain Management:    Induction: Intravenous  PONV Risk Score and Plan: 2 and Ondansetron and Dexamethasone  Airway Management Planned: Oral ETT  Additional Equipment: None  Intra-op Plan:   Post-operative Plan: Extubation in OR  Informed Consent: I have reviewed the patients History and Physical, chart, labs and discussed the procedure including  the risks, benefits and alternatives for the proposed anesthesia with the patient or authorized representative who has indicated his/her understanding and acceptance.     Dental advisory given  Plan Discussed with: CRNA and Surgeon  Anesthesia Plan Comments: (See PAT note 03/05/2019, Konrad Felix, PA-C)       Anesthesia Quick Evaluation

## 2019-03-08 ENCOUNTER — Other Ambulatory Visit: Payer: Self-pay

## 2019-03-08 ENCOUNTER — Other Ambulatory Visit: Payer: Self-pay | Admitting: Internal Medicine

## 2019-03-08 NOTE — Patient Outreach (Signed)
[facility] Care Management  03/08/2019  [name] [birth year] [id number]   Successful follow up call to patient today regarding social work referral for transportation resources.  Patient states that he has been using a friends car from time to time.  He has not yet contacted [name] to inquire about transportation benefit and was encouraged to do so. Patient confirmed receipt of Information systems manager.   Closing social work case at this time but encouraged him to call if additional needs arise.  Ronn Melena, BSW Social Worker [phone]

## 2019-03-09 ENCOUNTER — Encounter (HOSPITAL_COMMUNITY)
Admission: RE | Disposition: A | Discharge status: Home Health | Payer: Self-pay | Source: Home / Self Care | Attending: Urology

## 2019-03-09 ENCOUNTER — Inpatient Hospital Stay (HOSPITAL_COMMUNITY): Payer: Medicare Other | Admitting: Certified Registered Nurse Anesthetist

## 2019-03-09 ENCOUNTER — Inpatient Hospital Stay (HOSPITAL_COMMUNITY): Payer: Medicare Other | Admitting: Physician Assistant

## 2019-03-09 ENCOUNTER — Inpatient Hospital Stay (HOSPITAL_COMMUNITY)
Admission: RE | Admit: 2019-03-09 | Discharge: 2019-03-15 | DRG: 707 | Disposition: A | Discharge status: Home Health | Payer: Medicare Other | Attending: Urology | Admitting: Urology

## 2019-03-09 ENCOUNTER — Other Ambulatory Visit: Payer: Self-pay

## 2019-03-09 ENCOUNTER — Encounter (HOSPITAL_COMMUNITY): Payer: Self-pay

## 2019-03-09 DIAGNOSIS — D72829 Elevated white blood cell count, unspecified: Secondary | ICD-10-CM | POA: Diagnosis not present

## 2019-03-09 DIAGNOSIS — Z20828 Contact with and (suspected) exposure to other viral communicable diseases: Secondary | ICD-10-CM | POA: Diagnosis present

## 2019-03-09 DIAGNOSIS — R509 Fever, unspecified: Secondary | ICD-10-CM | POA: Diagnosis not present

## 2019-03-09 DIAGNOSIS — Z9081 Acquired absence of spleen: Secondary | ICD-10-CM | POA: Diagnosis not present

## 2019-03-09 DIAGNOSIS — R972 Elevated prostate specific antigen [PSA]: Secondary | ICD-10-CM | POA: Diagnosis present

## 2019-03-09 DIAGNOSIS — N320 Bladder-neck obstruction: Secondary | ICD-10-CM | POA: Diagnosis not present

## 2019-03-09 DIAGNOSIS — M109 Gout, unspecified: Secondary | ICD-10-CM | POA: Diagnosis present

## 2019-03-09 DIAGNOSIS — K5900 Constipation, unspecified: Secondary | ICD-10-CM | POA: Diagnosis not present

## 2019-03-09 DIAGNOSIS — Z6832 Body mass index (BMI) 32.0-32.9, adult: Secondary | ICD-10-CM | POA: Diagnosis not present

## 2019-03-09 DIAGNOSIS — N029 Recurrent and persistent hematuria with unspecified morphologic changes: Secondary | ICD-10-CM | POA: Diagnosis not present

## 2019-03-09 DIAGNOSIS — Z87891 Personal history of nicotine dependence: Secondary | ICD-10-CM | POA: Diagnosis not present

## 2019-03-09 DIAGNOSIS — D631 Anemia in chronic kidney disease: Secondary | ICD-10-CM | POA: Diagnosis not present

## 2019-03-09 DIAGNOSIS — Z560 Unemployment, unspecified: Secondary | ICD-10-CM

## 2019-03-09 DIAGNOSIS — I129 Hypertensive chronic kidney disease with stage 1 through stage 4 chronic kidney disease, or unspecified chronic kidney disease: Secondary | ICD-10-CM | POA: Diagnosis not present

## 2019-03-09 DIAGNOSIS — N401 Enlarged prostate with lower urinary tract symptoms: Principal | ICD-10-CM | POA: Diagnosis present

## 2019-03-09 DIAGNOSIS — R310 Gross hematuria: Secondary | ICD-10-CM | POA: Diagnosis not present

## 2019-03-09 DIAGNOSIS — N138 Other obstructive and reflux uropathy: Secondary | ICD-10-CM | POA: Diagnosis not present

## 2019-03-09 DIAGNOSIS — E785 Hyperlipidemia, unspecified: Secondary | ICD-10-CM | POA: Diagnosis present

## 2019-03-09 DIAGNOSIS — D62 Acute posthemorrhagic anemia: Secondary | ICD-10-CM | POA: Diagnosis not present

## 2019-03-09 DIAGNOSIS — Z79899 Other long term (current) drug therapy: Secondary | ICD-10-CM

## 2019-03-09 DIAGNOSIS — N183 Chronic kidney disease, stage 3 unspecified: Secondary | ICD-10-CM | POA: Diagnosis not present

## 2019-03-09 DIAGNOSIS — G8929 Other chronic pain: Secondary | ICD-10-CM | POA: Diagnosis present

## 2019-03-09 HISTORY — PX: XI ROBOTIC ASSISTED SIMPLE PROSTATECTOMY: SHX6713

## 2019-03-09 LAB — GLUCOSE, CAPILLARY
Glucose-Capillary: 79 mg/dL (ref 70–99)
Glucose-Capillary: 170 mg/dL — ABNORMAL HIGH (ref 70–99)

## 2019-03-09 LAB — HEMOGLOBIN AND HEMATOCRIT, BLOOD
HCT: 27.6 % — ABNORMAL LOW (ref 39.0–52.0)
Hemoglobin: 8.2 g/dL — ABNORMAL LOW (ref 13.0–17.0)

## 2019-03-09 LAB — PREPARE RBC (CROSSMATCH)

## 2019-03-09 SURGERY — PROSTATECTOMY, SIMPLE, ROBOT-ASSISTED
Anesthesia: General

## 2019-03-09 MED ORDER — FINASTERIDE 5 MG PO TABS
5.0000 mg | ORAL_TABLET | Freq: Every day | ORAL | Status: DC
  Administered 2019-03-09 – 2019-03-15 (×7): 5 mg via ORAL
  Filled 2019-03-09 (×7): qty 1

## 2019-03-09 MED ORDER — SODIUM CHLORIDE (PF) 0.9 % IJ SOLN
INTRAMUSCULAR | Status: AC
  Filled 2019-03-09: qty 20

## 2019-03-09 MED ORDER — ACETAMINOPHEN 10 MG/ML IV SOLN
1000.0000 mg | Freq: Four times a day (QID) | INTRAVENOUS | Status: AC
  Administered 2019-03-09 – 2019-03-10 (×3): 1000 mg via INTRAVENOUS
  Filled 2019-03-09 (×3): qty 100

## 2019-03-09 MED ORDER — SODIUM CHLORIDE (PF) 0.9 % IJ SOLN
INTRAMUSCULAR | Status: DC | PRN
  Administered 2019-03-09: 20 mL

## 2019-03-09 MED ORDER — HYDROCODONE-ACETAMINOPHEN 5-325 MG PO TABS: 1.0000 | ORAL_TABLET | Freq: Four times a day (QID) | ORAL | 0 refills | Status: DC | PRN

## 2019-03-09 MED ORDER — SULFAMETHOXAZOLE-TRIMETHOPRIM 800-160 MG PO TABS: 1.0000 | ORAL_TABLET | Freq: Two times a day (BID) | ORAL | 0 refills | Status: DC

## 2019-03-09 MED ORDER — ACETAMINOPHEN 500 MG PO TABS: 1000.0000 mg | ORAL_TABLET | Freq: Once | ORAL | Status: DC | PRN

## 2019-03-09 MED ORDER — ALLOPURINOL 100 MG PO TABS
100.0000 mg | ORAL_TABLET | Freq: Every day | ORAL | Status: DC
  Administered 2019-03-09 – 2019-03-15 (×7): 100 mg via ORAL
  Filled 2019-03-09 (×8): qty 1

## 2019-03-09 MED ORDER — LIDOCAINE 2% (20 MG/ML) 5 ML SYRINGE
INTRAMUSCULAR | Status: AC
  Filled 2019-03-09: qty 5

## 2019-03-09 MED ORDER — ONDANSETRON HCL 4 MG/2ML IJ SOLN
INTRAMUSCULAR | Status: AC
  Filled 2019-03-09: qty 2

## 2019-03-09 MED ORDER — SODIUM CHLORIDE 0.9% IV SOLUTION: Freq: Once | INTRAVENOUS | Status: AC

## 2019-03-09 MED ORDER — ACETAMINOPHEN 10 MG/ML IV SOLN
INTRAVENOUS | Status: AC
  Administered 2019-03-09: 1000 mg via INTRAVENOUS
  Filled 2019-03-09: qty 100

## 2019-03-09 MED ORDER — MIDAZOLAM HCL 5 MG/5ML IJ SOLN
INTRAMUSCULAR | Status: DC | PRN
  Administered 2019-03-09: 1 mg via INTRAVENOUS

## 2019-03-09 MED ORDER — LACTATED RINGERS IV SOLN
INTRAVENOUS | Status: DC
  Administered 2019-03-09: 07:00:00 via INTRAVENOUS

## 2019-03-09 MED ORDER — SUGAMMADEX SODIUM 500 MG/5ML IV SOLN
INTRAVENOUS | Status: DC | PRN
  Administered 2019-03-09: 230 mg via INTRAVENOUS

## 2019-03-09 MED ORDER — DIPHENHYDRAMINE HCL 12.5 MG/5ML PO ELIX: 12.5000 mg | ORAL_SOLUTION | Freq: Four times a day (QID) | ORAL | Status: DC | PRN

## 2019-03-09 MED ORDER — FUROSEMIDE 20 MG PO TABS
20.0000 mg | ORAL_TABLET | Freq: Every day | ORAL | Status: DC
  Administered 2019-03-09 – 2019-03-15 (×7): 20 mg via ORAL
  Filled 2019-03-09 (×7): qty 1

## 2019-03-09 MED ORDER — BUPIVACAINE LIPOSOME 1.3 % IJ SUSP
20.0000 mL | Freq: Once | INTRAMUSCULAR | Status: AC
  Administered 2019-03-09: 10:00:00 20 mL
  Filled 2019-03-09: qty 20

## 2019-03-09 MED ORDER — OXYCODONE HCL 5 MG PO TABS: 5.0000 mg | ORAL_TABLET | Freq: Once | ORAL | Status: DC | PRN

## 2019-03-09 MED ORDER — SODIUM CHLORIDE 0.9 % IV BOLUS
1000.0000 mL | Freq: Once | INTRAVENOUS | Status: AC
  Administered 2019-03-09: 11:00:00 1000 mL via INTRAVENOUS

## 2019-03-09 MED ORDER — ALBUMIN HUMAN 5 % IV SOLN
INTRAVENOUS | Status: AC
  Filled 2019-03-09: qty 500

## 2019-03-09 MED ORDER — FENTANYL CITRATE (PF) 250 MCG/5ML IJ SOLN
INTRAMUSCULAR | Status: AC
  Filled 2019-03-09: qty 5

## 2019-03-09 MED ORDER — METOPROLOL TARTRATE 25 MG PO TABS
25.0000 mg | ORAL_TABLET | Freq: Two times a day (BID) | ORAL | Status: DC
  Administered 2019-03-09 – 2019-03-15 (×12): 25 mg via ORAL
  Filled 2019-03-09 (×12): qty 1

## 2019-03-09 MED ORDER — FENTANYL CITRATE (PF) 100 MCG/2ML IJ SOLN
INTRAMUSCULAR | Status: AC
  Administered 2019-03-09: 25 ug via INTRAVENOUS
  Filled 2019-03-09: qty 4

## 2019-03-09 MED ORDER — BACITRACIN-NEOMYCIN-POLYMYXIN 400-5-5000 EX OINT: 1.0000 "application " | TOPICAL_OINTMENT | Freq: Three times a day (TID) | CUTANEOUS | Status: DC | PRN

## 2019-03-09 MED ORDER — DEXTROSE-NACL 5-0.45 % IV SOLN
INTRAVENOUS | Status: DC
  Administered 2019-03-09 – 2019-03-11 (×3): via INTRAVENOUS

## 2019-03-09 MED ORDER — LACTATED RINGERS IV SOLN
INTRAVENOUS | Status: DC | PRN
  Administered 2019-03-09: 07:00:00 via INTRAVENOUS

## 2019-03-09 MED ORDER — HYDROMORPHONE HCL 1 MG/ML IJ SOLN
0.5000 mg | INTRAMUSCULAR | Status: DC | PRN
  Administered 2019-03-09: 1 mg via INTRAVENOUS
  Administered 2019-03-09: 0.5 mg via INTRAVENOUS
  Administered 2019-03-10 – 2019-03-11 (×5): 1 mg via INTRAVENOUS
  Filled 2019-03-09 (×7): qty 1

## 2019-03-09 MED ORDER — ATORVASTATIN CALCIUM 40 MG PO TABS
40.0000 mg | ORAL_TABLET | Freq: Every day | ORAL | Status: DC
  Administered 2019-03-09 – 2019-03-14 (×6): 40 mg via ORAL
  Filled 2019-03-09 (×5): qty 1
  Filled 2019-03-09: qty 2

## 2019-03-09 MED ORDER — ACETAMINOPHEN 10 MG/ML IV SOLN: 1000.0000 mg | Freq: Once | INTRAVENOUS | Status: DC | PRN

## 2019-03-09 MED ORDER — PHENYLEPHRINE HCL (PRESSORS) 10 MG/ML IV SOLN
INTRAVENOUS | Status: AC
  Filled 2019-03-09: qty 1

## 2019-03-09 MED ORDER — LACTATED RINGERS IV SOLN: INTRAVENOUS | Status: DC

## 2019-03-09 MED ORDER — ONDANSETRON HCL 4 MG/2ML IJ SOLN
INTRAMUSCULAR | Status: DC | PRN
  Administered 2019-03-09: 4 mg via INTRAVENOUS

## 2019-03-09 MED ORDER — ACETAMINOPHEN 10 MG/ML IV SOLN
1000.0000 mg | Freq: Four times a day (QID) | INTRAVENOUS | Status: DC
  Administered 2019-03-09: 16:00:00 1000 mg via INTRAVENOUS
  Filled 2019-03-09: qty 100

## 2019-03-09 MED ORDER — TAMSULOSIN HCL 0.4 MG PO CAPS
0.4000 mg | ORAL_CAPSULE | Freq: Every day | ORAL | Status: DC
  Administered 2019-03-09 – 2019-03-15 (×7): 0.4 mg via ORAL
  Filled 2019-03-09 (×7): qty 1

## 2019-03-09 MED ORDER — CEFAZOLIN SODIUM-DEXTROSE 2-4 GM/100ML-% IV SOLN
2.0000 g | INTRAVENOUS | Status: AC
  Administered 2019-03-09: 2 g via INTRAVENOUS
  Filled 2019-03-09: qty 100

## 2019-03-09 MED ORDER — ONDANSETRON HCL 4 MG/2ML IJ SOLN: 4.0000 mg | INTRAMUSCULAR | Status: DC | PRN

## 2019-03-09 MED ORDER — ACETAMINOPHEN 160 MG/5ML PO SOLN: 1000.0000 mg | Freq: Once | ORAL | Status: DC | PRN

## 2019-03-09 MED ORDER — DEXAMETHASONE SODIUM PHOSPHATE 10 MG/ML IJ SOLN
INTRAMUSCULAR | Status: AC
  Filled 2019-03-09: qty 1

## 2019-03-09 MED ORDER — POTASSIUM 99 MG PO TABS: 99.0000 mg | ORAL_TABLET | Freq: Every day | ORAL | Status: DC

## 2019-03-09 MED ORDER — DOCUSATE SODIUM 100 MG PO CAPS
100.0000 mg | ORAL_CAPSULE | Freq: Two times a day (BID) | ORAL | Status: DC
  Administered 2019-03-09 – 2019-03-15 (×12): 100 mg via ORAL
  Filled 2019-03-09 (×12): qty 1

## 2019-03-09 MED ORDER — ALBUMIN HUMAN 5 % IV SOLN
INTRAVENOUS | Status: DC | PRN
  Administered 2019-03-09 (×2): via INTRAVENOUS

## 2019-03-09 MED ORDER — ROCURONIUM BROMIDE 50 MG/5ML IV SOSY
PREFILLED_SYRINGE | INTRAVENOUS | Status: DC | PRN
  Administered 2019-03-09: 60 mg via INTRAVENOUS
  Administered 2019-03-09: 20 mg via INTRAVENOUS

## 2019-03-09 MED ORDER — LACTATED RINGERS IR SOLN
Status: DC | PRN
  Administered 2019-03-09: 1000 mL

## 2019-03-09 MED ORDER — SODIUM CHLORIDE 0.9 % IR SOLN
3000.0000 mL | Status: DC
  Administered 2019-03-09 – 2019-03-14 (×32): 3000 mL

## 2019-03-09 MED ORDER — OXYCODONE HCL 5 MG/5ML PO SOLN: 5.0000 mg | Freq: Once | ORAL | Status: DC | PRN

## 2019-03-09 MED ORDER — MAGNESIUM CITRATE PO SOLN: 1.0000 | Freq: Once | ORAL | Status: DC

## 2019-03-09 MED ORDER — DEXAMETHASONE SODIUM PHOSPHATE 10 MG/ML IJ SOLN
INTRAMUSCULAR | Status: DC | PRN
  Administered 2019-03-09: 8 mg via INTRAVENOUS

## 2019-03-09 MED ORDER — FENTANYL CITRATE (PF) 100 MCG/2ML IJ SOLN
25.0000 ug | INTRAMUSCULAR | Status: DC | PRN
  Administered 2019-03-09: 50 ug via INTRAVENOUS
  Administered 2019-03-09: 11:00:00 25 ug via INTRAVENOUS
  Administered 2019-03-09: 50 ug via INTRAVENOUS
  Administered 2019-03-09: 25 ug via INTRAVENOUS

## 2019-03-09 MED ORDER — METOPROLOL TARTRATE 25 MG PO TABS
25.0000 mg | ORAL_TABLET | Freq: Once | ORAL | Status: AC
  Administered 2019-03-09: 07:00:00 25 mg via ORAL
  Filled 2019-03-09 (×2): qty 1

## 2019-03-09 MED ORDER — DIPHENHYDRAMINE HCL 50 MG/ML IJ SOLN: 12.5000 mg | Freq: Four times a day (QID) | INTRAMUSCULAR | Status: DC | PRN

## 2019-03-09 MED ORDER — PROPOFOL 10 MG/ML IV BOLUS
INTRAVENOUS | Status: DC | PRN
  Administered 2019-03-09: 130 mg via INTRAVENOUS

## 2019-03-09 MED ORDER — COLCHICINE 0.6 MG PO TABS
0.6000 mg | ORAL_TABLET | Freq: Every day | ORAL | Status: DC | PRN
  Administered 2019-03-12 – 2019-03-13 (×2): 0.6 mg via ORAL
  Filled 2019-03-09 (×2): qty 1

## 2019-03-09 MED ORDER — SUGAMMADEX SODIUM 500 MG/5ML IV SOLN
INTRAVENOUS | Status: AC
  Filled 2019-03-09: qty 5

## 2019-03-09 MED ORDER — BELLADONNA ALKALOIDS-OPIUM 16.2-60 MG RE SUPP
1.0000 | Freq: Four times a day (QID) | RECTAL | Status: DC | PRN
  Administered 2019-03-09 – 2019-03-15 (×6): 1 via RECTAL
  Filled 2019-03-09 (×6): qty 1

## 2019-03-09 MED ORDER — LIDOCAINE 2% (20 MG/ML) 5 ML SYRINGE
INTRAMUSCULAR | Status: DC | PRN
  Administered 2019-03-09: 100 mg via INTRAVENOUS

## 2019-03-09 MED ORDER — FENTANYL CITRATE (PF) 100 MCG/2ML IJ SOLN
INTRAMUSCULAR | Status: DC | PRN
  Administered 2019-03-09 (×2): 50 ug via INTRAVENOUS
  Administered 2019-03-09: 100 ug via INTRAVENOUS
  Administered 2019-03-09: 50 ug via INTRAVENOUS

## 2019-03-09 MED ORDER — STERILE WATER FOR IRRIGATION IR SOLN
Status: DC | PRN
  Administered 2019-03-09: 1000 mL

## 2019-03-09 MED ORDER — ROCURONIUM BROMIDE 10 MG/ML (PF) SYRINGE
PREFILLED_SYRINGE | INTRAVENOUS | Status: AC
  Filled 2019-03-09: qty 10

## 2019-03-09 MED ORDER — PROPOFOL 10 MG/ML IV BOLUS
INTRAVENOUS | Status: AC
  Filled 2019-03-09: qty 20

## 2019-03-09 MED ORDER — 0.9 % SODIUM CHLORIDE (POUR BTL) OPTIME
TOPICAL | Status: DC | PRN
  Administered 2019-03-09: 1000 mL

## 2019-03-09 MED ORDER — OXYCODONE HCL 5 MG PO TABS
5.0000 mg | ORAL_TABLET | ORAL | Status: DC | PRN
  Administered 2019-03-11 – 2019-03-15 (×9): 5 mg via ORAL
  Filled 2019-03-09 (×10): qty 1

## 2019-03-09 MED ORDER — HYDROMORPHONE HCL 1 MG/ML IJ SOLN
0.2500 mg | INTRAMUSCULAR | Status: DC | PRN
  Administered 2019-03-09 (×2): 0.5 mg via INTRAVENOUS

## 2019-03-09 MED ORDER — MIDAZOLAM HCL 2 MG/2ML IJ SOLN
INTRAMUSCULAR | Status: AC
  Filled 2019-03-09: qty 2

## 2019-03-09 MED ORDER — HYDROMORPHONE HCL 1 MG/ML IJ SOLN
INTRAMUSCULAR | Status: AC
  Filled 2019-03-09: qty 1

## 2019-03-09 MED ORDER — CHLORHEXIDINE GLUCONATE CLOTH 2 % EX PADS
6.0000 | MEDICATED_PAD | Freq: Every day | CUTANEOUS | Status: DC
  Administered 2019-03-10 – 2019-03-15 (×6): 6 via TOPICAL

## 2019-03-09 SURGICAL SUPPLY — 68 items
ADH SKN CLS APL DERMABOND .7 (GAUZE/BANDAGES/DRESSINGS) ×1
APL PRP STRL LF DISP 70% ISPRP (MISCELLANEOUS) ×1
APL SWBSTK 6 STRL LF DISP (MISCELLANEOUS) ×1
APPLICATOR COTTON TIP 6 STRL (MISCELLANEOUS) ×1 IMPLANT
APPLICATOR COTTON TIP 6IN STRL (MISCELLANEOUS) ×2
BAG LAPAROSCOPIC 12 15 PORT 16 (BASKET) IMPLANT
BAG RETRIEVAL 12/15 (BASKET) ×2
CATH FOLEY 2WAY SLVR  5CC 18FR (CATHETERS) ×1
CATH FOLEY 2WAY SLVR 5CC 18FR (CATHETERS) ×1 IMPLANT
CATH FOLEY 3WAY 30CC 22FR (CATHETERS) ×2 IMPLANT
CATH HEMA 3WAY 30CC 24FR COUDE (CATHETERS) ×1 IMPLANT
CATH TIEMANN FOLEY 18FR 5CC (CATHETERS) ×1 IMPLANT
CHLORAPREP W/TINT 26 (MISCELLANEOUS) ×2 IMPLANT
CLOTH BEACON ORANGE TIMEOUT ST (SAFETY) ×2 IMPLANT
COVER SURGICAL LIGHT HANDLE (MISCELLANEOUS) ×2 IMPLANT
COVER TIP SHEARS 8 DVNC (MISCELLANEOUS) ×1 IMPLANT
COVER TIP SHEARS 8MM DA VINCI (MISCELLANEOUS) ×1
COVER WAND RF STERILE (DRAPES) IMPLANT
DECANTER SPIKE VIAL GLASS SM (MISCELLANEOUS) ×2 IMPLANT
DERMABOND ADVANCED (GAUZE/BANDAGES/DRESSINGS) ×1
DERMABOND ADVANCED .7 DNX12 (GAUZE/BANDAGES/DRESSINGS) ×1 IMPLANT
DRAPE ARM DVNC X/XI (DISPOSABLE) ×4 IMPLANT
DRAPE COLUMN DVNC XI (DISPOSABLE) ×1 IMPLANT
DRAPE DA VINCI XI ARM (DISPOSABLE) ×4
DRAPE DA VINCI XI COLUMN (DISPOSABLE) ×1
DRAPE SURG IRRIG POUCH 19X23 (DRAPES) ×2 IMPLANT
DRSG TEGADERM 4X4.75 (GAUZE/BANDAGES/DRESSINGS) ×2 IMPLANT
ELECT PENCIL ROCKER SW 15FT (MISCELLANEOUS) ×2 IMPLANT
ELECT REM PT RETURN 15FT ADLT (MISCELLANEOUS) ×2 IMPLANT
GAUZE SPONGE 2X2 8PLY STRL LF (GAUZE/BANDAGES/DRESSINGS) ×1 IMPLANT
GLOVE BIO SURGEON STRL SZ 6.5 (GLOVE) ×2 IMPLANT
GLOVE BIO SURGEON STRL SZ8 (GLOVE) ×4 IMPLANT
GLOVE BIOGEL PI IND STRL 8 (GLOVE) ×2 IMPLANT
GLOVE BIOGEL PI INDICATOR 8 (GLOVE) ×2
GOWN STRL REUS W/TWL LRG LVL3 (GOWN DISPOSABLE) ×6 IMPLANT
HOLDER FOLEY CATH W/STRAP (MISCELLANEOUS) ×2 IMPLANT
IRRIG SUCT STRYKERFLOW 2 WTIP (MISCELLANEOUS) ×2
IRRIGATION SUCT STRKRFLW 2 WTP (MISCELLANEOUS) ×1 IMPLANT
IV LACTATED RINGERS 1000ML (IV SOLUTION) ×2 IMPLANT
KIT TURNOVER KIT A (KITS) IMPLANT
NDL INSUFFLATION 14GA 120MM (NEEDLE) ×1 IMPLANT
NEEDLE INSUFFLATION 14GA 120MM (NEEDLE) ×2 IMPLANT
PACK ROBOT UROLOGY CUSTOM (CUSTOM PROCEDURE TRAY) ×2 IMPLANT
PAD POSITIONING PINK XL (MISCELLANEOUS) ×2 IMPLANT
SEAL CANN UNIV 5-8 DVNC XI (MISCELLANEOUS) ×4 IMPLANT
SEAL XI 5MM-8MM UNIVERSAL (MISCELLANEOUS) ×4
SET IRRIG Y TYPE TUR BLADDER L (SET/KITS/TRAYS/PACK) ×1 IMPLANT
SET TUBE SMOKE EVAC HIGH FLOW (TUBING) ×1 IMPLANT
SOLUTION ELECTROLUBE (MISCELLANEOUS) ×2 IMPLANT
SPONGE GAUZE 2X2 STER 10/PKG (GAUZE/BANDAGES/DRESSINGS)
SPONGE LAP 4X18 RFD (DISPOSABLE) IMPLANT
SUT ETHILON 3 0 PS 1 (SUTURE) ×2 IMPLANT
SUT MNCRL AB 4-0 PS2 18 (SUTURE) ×4 IMPLANT
SUT PDS AB 1 CT1 27 (SUTURE) ×1 IMPLANT
SUT V-LOC BARB 180 2/0GR6 GS22 (SUTURE) ×4
SUT VIC AB 0 CT1 27 (SUTURE) ×16
SUT VIC AB 0 CT1 27XBRD ANTBC (SUTURE) ×3 IMPLANT
SUT VIC AB 2-0 CT1 27 (SUTURE) ×2
SUT VIC AB 2-0 CT1 TAPERPNT 27 (SUTURE) IMPLANT
SUT VIC AB 2-0 SH 27 (SUTURE)
SUT VIC AB 2-0 SH 27X BRD (SUTURE) IMPLANT
SUT VICRYL 0 UR6 27IN ABS (SUTURE) ×5 IMPLANT
SUT VLOC BARB 180 ABS3/0GR12 (SUTURE)
SUTURE V-LC BRB 180 2/0GR6GS22 (SUTURE) ×2 IMPLANT
SUTURE VLOC BRB 180 ABS3/0GR12 (SUTURE) IMPLANT
SYRINGE IRR TOOMEY STRL 70CC (SYRINGE) ×1 IMPLANT
TOWEL OR NON WOVEN STRL DISP B (DISPOSABLE) ×2 IMPLANT
WATER STERILE IRR 1000ML POUR (IV SOLUTION) ×2 IMPLANT

## 2019-03-09 NOTE — Anesthesia Procedure Notes (Signed)
Procedure Name: Intubation Date/Time: 03/09/2019 7:54 AM Performed by: Montel Clock, CRNA Pre-anesthesia Checklist: Patient identified, Emergency Drugs available, Suction available, Patient being monitored and Timeout performed Patient Re-evaluated:Patient Re-evaluated prior to induction Oxygen Delivery Method: Circle system utilized Preoxygenation: Pre-oxygenation with 100% oxygen Induction Type: IV induction Ventilation: Mask ventilation without difficulty and Oral airway inserted - appropriate to patient size Laryngoscope Size: Mac and 4 Grade View: Grade II Tube type: Oral Tube size: 7.5 mm Number of attempts: 1 Airway Equipment and Method: Stylet Placement Confirmation: ETT inserted through vocal cords under direct vision,  positive ETCO2 and breath sounds checked- equal and bilateral Secured at: 23 cm Tube secured with: Tape Dental Injury: Teeth and Oropharynx as per pre-operative assessment

## 2019-03-09 NOTE — Anesthesia Postprocedure Evaluation (Signed)
Anesthesia Post Note  Patient: [name], advertising  Procedure(s) Performed: XI ROBOTIC ASSISTED SIMPLE PROSTATECTOMY (N/A )     Patient location during evaluation: PACU Anesthesia Type: General Level of consciousness: awake and alert Pain management: pain level controlled Vital Signs Assessment: post-procedure vital signs reviewed and stable Respiratory status: spontaneous breathing, nonlabored ventilation, respiratory function stable and patient connected to nasal cannula oxygen Cardiovascular status: blood pressure returned to baseline and stable Postop Assessment: no apparent nausea or vomiting Anesthetic complications: no    Last Vitals:  Vitals:   03/09/19 1200 03/09/19 1306  BP:  (!) 157/106  Pulse:  81  Resp: 12 18  Temp: 36.8 C (!) 36.4 C  SpO2:  100%    Last Pain:  Vitals:   03/09/19 1338  TempSrc:   PainSc: 5                  Noralyn Karim

## 2019-03-09 NOTE — Interval H&P Note (Signed)
History and Physical Interval Note:  03/09/2019 7:38 AM  Cutberto Moseng  has presented today for surgery, with the diagnosis of BENIGN PROSTATIC HYPERPLASIA.  The various methods of treatment have been discussed with the patient and family. After consideration of risks, benefits and other options for treatment, the patient has consented to  Procedure(s): XI ROBOTIC [place] (N/A) as a surgical intervention.  The patient's history has been reviewed, patient examined, no change in status, stable for surgery.  I have reviewed the patient's chart and labs.  Questions were answered to the patient's satisfaction.     Nicolette Bang

## 2019-03-09 NOTE — Op Note (Signed)
PREOPERATIVE DIAGNOSIS: BPH with LUTS, Gross hematuria  POSTOPERATIVE DIAGNOSIS: Same  PROCEDURES: 1. Robotic-assisted laparoscopic simple prostatectomy.  ANESTHESIA: General  ATTENDING: Nicolette Bang, MD  ASSISTANT: Clemetine Marker, PA   ESTIMATED BLOOD LOSS: 500 mL.  COMPLICATIONS: None.  SPECIMEN: 1.prostatic adenoma  ANTIBIOTICS: ancef  FINDINGS: 7cm intravesical prostatic protrusion. Ureteral orifices in normal anatomic location. No leaks from cystotomy at 150cc of water. The assistant was utilized for retraction, suction, and passing suture,  DRAINS: 1. Jackson-Pratt drain to bulb suction. 2. Foley catheter to straight drain.  INDICATION: [name] is a very pleasant 68 year old gentleman, who has BPH with significant LUTS including elevated . His prosatate volume on MRI is 270cc.  Options were discussed with the patient in detail for primary manage including continued surveillance protocols versus surgical extirpation with and without minimally invasive assistance and he wished to proceed with robotic simple prostatectomy. Informed consent was obtained and placed in the medical record.  PROCEDURE IN DETAIL: The patient was brought to the operating room and a breif timeout was down to ensure correct patient, correct procedure, and correct site. Intravenous antibiotics were administered. General endotracheal anesthesia was introduced. The patient was placed into a low lithotomy position after tucking his arms with foam padding, placing on a pink and non-slide foam pad. A test of steep Trendelenburg positioning was performed and he was found to be suitably positioned. Sterile field was created by prepping and draping the patient's penis, perineum and proximal thighs using iodine and his infra-xiphoid abdomen using chlorhexidine gluconate. Next, a high-flow, low-pressure pneumoperitoneum was obtained using Veress technique in the infraumbilical midline  having passed the aspiration and drop test. Next, a 8-mm robotic camera port was placed in the same location. Laparoscopic examination of the peritoneal cavity revealed no significant adhesions and no visceral injury. Additional ports were placed as follows: Right paramedian 8-mm robotic port, right far lateral 12-mm assistant port, left paramedian 8-mm robotic port, left far lateral 8-mm robotic port, and right paramedian 5-mm suction port. Robot was docked and passed through the electronic checks. Next, attention was directed for the development of space of Retzius. Incision was made lateral to the left medial umbilical ligament from the midline towards the area of the internal ring and coursing along the iliac vessels towards the area of the ureter, which was positively identified. The left bladder was dissected away from the pelvic sidewall towards the area of the endopelvic fascia. A mirror-imaged dissection was performed on the right side.  Additional anterior attachments were taken down using cautery scissors. Next, the bladder neck was identified moving the Foley catheter back and forth. We then made a 6cm transverse cystotomy 3cm from the bladder neck. We identified the ureteral orifices and care was taken to exclude then from the dissection. We then placed 3 holding stitches in the anterior bladder wall and secured it to Coopers ligament.  We then made a circumscribing incision around the base of the prostate. We then used a 0 vicryl in a figure of eight fashion in the base of the adenoma for traction. We proceeded with a posterior dissection of the prostate until we identified the capsule. We then used a combination of electrocautery, blunt and sharp dissection to free the adenoma from the capsule. We then dissected laterally to the apex. Individual bleeders were cauterized. We then dissected anteriorly along the capsule until we reached the apex. The adenoma was then freed and  placed in an endocatch bag. We noted good hemostasis and no additional  sutures were placed. A Foley catheter was then placed per urethra easily. We then tacked down the bladder neck to the prostatic fossa with a single interrupted 2-0 vicryl. We then proceeded to closed the cystotomy. We closed the bladder with a running 0 vicryl full thickness. We then performed a imbricating second layer  Closure with 0 vicryl. The bladder was then filled with 150cc of water and we noted no leak.  All sponge and needle counts were correct. A closed suction drain was brought to the previous left lateral robotic port site into the area of the peritoneal cavity. The previous right 12-mm assistant port was closed at the level of the fascia using a Carter-Thomason suture passer under laparoscopic vision. Robot was undocked. Specimen was retrieved by extending the previous camera port site inferiorly for distance approximately 3 cm and removing the prostatectomy specimens and setting aside for permanent pathology. The site was closed at the level of fascia using figure-of-eight 0 vicryl followed by reapproximation of the Scarpa's using running Vicryl. All incision sites were infiltrated with dilute Lyophilized Marcaine and closed at the level of the skin using subcuticular Monocryl followed by Dermabond. Procedure was then terminated. The patient tolerated the procedure well. There were no immediate periprocedural complications and the patient was taken to the postanesthesia care unit in stable Condition.  COMPLICATIONS: None  CONDITION: Stable, extubated, transferred to PACU  PLAN: The patient will be admitted for 1-2 for hydration, post operative monitoring and pain control. He will be discharged home with foley in place and foley will be removed in 14 days, He will have a cystogram prior to foley catheter removal

## 2019-03-09 NOTE — Addendum Note (Signed)
Addendum  created 03/09/19 1653 by Montel Clock, CRNA   Charge Capture section accepted

## 2019-03-09 NOTE — Transfer of Care (Signed)
Immediate Anesthesia Transfer of Care Note  Patient: [name]  Procedure(s) Performed: XI ROBOTIC ASSISTED SIMPLE PROSTATECTOMY (N/A )  Patient Location: PACU  Anesthesia Type:General  Level of Consciousness: drowsy and patient cooperative  Airway & Oxygen Therapy: Patient Spontanous Breathing and Patient connected to face mask oxygen  Post-op Assessment: Report given to RN and Post -op Vital signs reviewed and stable  Post vital signs: Reviewed and stable  Last Vitals:  Vitals Value Taken Time  BP    Temp    Pulse 72 03/09/19 1104  Resp 18 03/09/19 1105  SpO2 100 % 03/09/19 1104  Vitals shown include unvalidated device data.  Last Pain:  Vitals:   03/09/19 0623  TempSrc:   PainSc: 7       Patients Stated Pain Goal: 1 ([phone] [id number])  Complications: No apparent anesthesia complications

## 2019-03-09 NOTE — Discharge Instructions (Signed)

## 2019-03-10 ENCOUNTER — Encounter (HOSPITAL_COMMUNITY): Payer: Self-pay | Admitting: Urology

## 2019-03-10 LAB — BASIC METABOLIC PANEL
Anion gap: 9 (ref 5–15)
BUN: 30 mg/dL — ABNORMAL HIGH (ref 8–23)
CO2: 26 mmol/L (ref 22–32)
Calcium: 8.1 mg/dL — ABNORMAL LOW (ref 8.9–10.3)
Chloride: 102 mmol/L (ref 98–111)
Creatinine, Ser: 2.75 mg/dL — ABNORMAL HIGH (ref 0.61–1.24)
GFR calc Af Amer: 26 mL/min — ABNORMAL LOW (ref >60)
GFR calc non Af Amer: 23 mL/min — ABNORMAL LOW (ref >60)
Glucose, Bld: 140 mg/dL — ABNORMAL HIGH (ref 70–99)
Potassium: 3.8 mmol/L (ref 3.5–5.1)
Sodium: 137 mmol/L (ref 135–145)

## 2019-03-10 LAB — PREPARE RBC (CROSSMATCH)

## 2019-03-10 LAB — HEMOGLOBIN AND HEMATOCRIT, BLOOD
HCT: 22.3 % — ABNORMAL LOW (ref 39.0–52.0)
HCT: 25.5 % — ABNORMAL LOW (ref 39.0–52.0)
Hemoglobin: 6.5 g/dL — CL (ref 13.0–17.0)
Hemoglobin: 7.7 g/dL — ABNORMAL LOW (ref 13.0–17.0)

## 2019-03-10 LAB — GLUCOSE, CAPILLARY: Glucose-Capillary: 142 mg/dL — ABNORMAL HIGH (ref 70–99)

## 2019-03-10 MED ORDER — SODIUM CHLORIDE 0.9% IV SOLUTION
Freq: Once | INTRAVENOUS | Status: AC
  Administered 2019-03-10: 10:00:00 via INTRAVENOUS

## 2019-03-10 NOTE — Progress Notes (Signed)
Urology Inpatient Progress Report  BENIGN PROSTATIC HYPERPLASIA  Procedure(s): XI ROBOTIC ASSISTED SIMPLE PROSTATECTOMY  1 Day Post-Op   Intv/Subj: No acute events overnight. Patient is without complaint. Hemoglobin is down to 6.5 today.  Transfusion orders have been entered.  Creatinine is stable.  Urine seems to be clearing up on CBI.  Active Problems:   BPH with obstruction/lower urinary tract symptoms  Current Facility-Administered Medications  Medication Dose Route Frequency Provider Last Rate Last Dose  . acetaminophen (OFIRMEV) IV 1,000 mg  1,000 mg Intravenous Q6H Cleon Gustin, MD 400 mL/hr at 03/10/19 0536 1,000 mg at 03/10/19 0536  . allopurinol (ZYLOPRIM) tablet 100 mg  100 mg Oral Daily Debbrah Alar, PA-C   100 mg at 03/10/19 0949  . atorvastatin (LIPITOR) tablet 40 mg  40 mg Oral QHS Debbrah Alar, PA-C   40 mg at 03/09/19 2109  . Chlorhexidine Gluconate Cloth 2 % PADS 6 each  6 each Topical Daily McKenzie, Candee Furbish, MD   6 each at 03/10/19 [phone]  . colchicine tablet 0.6 mg  0.6 mg Oral Daily PRN Dancy, Amanda, PA-C      . dextrose 5 %-0.45 % sodium chloride infusion   Intravenous Continuous Debbrah Alar, PA-C 75 mL/hr at 03/09/19 1400    . diphenhydrAMINE (BENADRYL) injection 12.5 mg  12.5 mg Intravenous Q6H PRN Dancy, Amanda, PA-C       Or  . diphenhydrAMINE (BENADRYL) 12.5 MG/5ML elixir 12.5 mg  12.5 mg Oral Q6H PRN Dancy, Amanda, PA-C      . docusate sodium (COLACE) capsule 100 mg  100 mg Oral BID Debbrah Alar, PA-C   100 mg at 03/10/19 0949  . finasteride (PROSCAR) tablet 5 mg  5 mg Oral Daily Debbrah Alar, PA-C   5 mg at 03/10/19 0949  . furosemide (LASIX) tablet 20 mg  20 mg Oral Daily Debbrah Alar, PA-C   20 mg at 03/10/19 0949  . HYDROmorphone (DILAUDID) injection 0.5-1 mg  0.5-1 mg Intravenous Q2H PRN Debbrah Alar, PA-C   1 mg at 03/10/19 0835  . lactated ringers infusion   Intravenous Continuous Lynda Rainwater, MD   Stopped at 03/09/19 1115   . metoprolol tartrate (LOPRESSOR) tablet 25 mg  25 mg Oral BID Debbrah Alar, PA-C   25 mg at 03/10/19 0949  . neomycin-bacitracin-polymyxin (NEOSPORIN) ointment packet 1 application  1 application Topical TID PRN Dancy, Amanda, PA-C      . ondansetron (ZOFRAN) injection 4 mg  4 mg Intravenous Q4H PRN Dancy, Amanda, PA-C      . opium-belladonna (B&O SUPPRETTES) 16.2-60 MG suppository 1 suppository  1 suppository Rectal Q6H PRN Debbrah Alar, PA-C   1 suppository at 03/09/19 1610  . oxyCODONE (Oxy IR/ROXICODONE) immediate release tablet 5 mg  5 mg Oral Q4H PRN Dancy, Amanda, PA-C      . sodium chloride irrigation 0.9 % 3,000 mL  3,000 mL Irrigation Continuous Dancy, Amanda, PA-C   3,000 mL at 03/10/19 0540  . tamsulosin (FLOMAX) capsule 0.4 mg  0.4 mg Oral Daily Debbrah Alar, PA-C   0.4 mg at 03/10/19 0949     Objective: Vital: Vitals:   03/10/19 0025 03/10/19 0407 03/10/19 0946 03/10/19 1030  BP: 110/84 114/82 121/87 113/83  Pulse: 63 70 65 66  Resp: 18 18  18   Temp: 98.4 F (36.9 C) 98 F (36.7 C) 97.9 F (36.6 C) 98.1 F (36.7 C)  TempSrc: Oral Oral Oral Oral  SpO2: 97% 96% 97%   Weight:  Height:       I/Os: I/O last 3 completed shifts: In: [id number] [P.O.:1260; I.V.:1958; Blood:315; Other:32150; IV [id number] Out: [id number]; Drains:180; Blood:500]  Physical Exam:  General: Patient is in no apparent distress Lungs: Normal respiratory effort, chest expands symmetrically. GI: Incisions are c/d/i. The abdomen is soft and minimally tender without mass. JP drain with serosanguinous drainage Foley: Three-way Foley catheter in place.  CBI is at a slow to moderate drip with light pink drainage Ext: lower extremities symmetric  Lab Results: Recent Labs    03/09/19 1126 03/10/19 0451  HGB 8.2* 6.5*  HCT 27.6* 22.3*   Recent Labs    03/10/19 0451  NA 137  K 3.8  CL 102  CO2 26  GLUCOSE 140*  BUN 30*  CREATININE 2.75*  CALCIUM 8.1*   No results for  input(s): LABPT, INR in the last 72 hours. No results for input(s): LABURIN in the last 72 hours. Results for orders placed or performed during the hospital encounter of 03/06/19  Novel Coronavirus, NAA (Hosp order, Send-out to Ref Lab; TAT 18-24 hrs     Status: None   Collection Time: 03/06/19  2:40 PM   Specimen: Nasopharyngeal Swab; Respiratory  Result Value Ref Range Status   SARS-CoV-2, NAA NOT DETECTED NOT DETECTED Final    Comment: (NOTE) This nucleic acid amplification test was developed and its performance characteristics determined by [employer]. Nucleic acid amplification tests include PCR and TMA. This test has not been FDA cleared or approved. This test has been authorized by FDA under an Emergency Use Authorization (EUA). This test is only authorized for the duration of time the declaration that circumstances exist justifying the authorization of the emergency use of in vitro diagnostic tests for detection of SARS-CoV-2 virus and/or diagnosis of COVID-19 infection under section 564(b)(1) of the Act, 21 U.S.C. [id number]) (1), unless the authorization is terminated or revoked sooner. When diagnostic testing is negative, the possibility of a false negative result should be considered in the context of a patient's recent exposures and the presence of clinical signs and symptoms consistent with COVID-19. An individual without symptoms of COVID- 19 and who is not shedding SARS-CoV-2 vi rus would expect to have a negative (not detected) result in this assay. Performed At: [facility] [address] [place], [place] [id number] Rush Farmer MD [id number]    [place]  Final    Comment: Performed at [facility], [place] [address] [place], [place] [address]    Studies/Results: No results found.  Assessment: BPH Acute blood loss anemia Procedure(s): XI ROBOTIC ASSISTED SIMPLE PROSTATECTOMY, 1 Day Post-Op  doing  well.  Plan: Continue to wean CBI Transfuse 2 units of PRBC for acute blood loss anemia Continue IV fluids.   Continue clear liquid diet and advance as tolerated Keep JP for today    Link Snuffer, MD Urology 03/10/2019, 10:39 AM

## 2019-03-11 LAB — BPAM RBC
Blood Product Expiration Date: 202010262359
Blood Product Expiration Date: 202011042359
Blood Product Expiration Date: 202011042359
ISSUE DATE / TIME: 202010020853
ISSUE DATE / TIME: 202010031008
ISSUE DATE / TIME: 202010031408
Unit Type and Rh: 5100
Unit Type and Rh: 5100
Unit Type and Rh: 5100

## 2019-03-11 LAB — TYPE AND SCREEN
ABO/RH(D): O POS
Antibody Screen: NEGATIVE
Unit division: 0
Unit division: 0
Unit division: 0

## 2019-03-11 LAB — HEMOGLOBIN AND HEMATOCRIT, BLOOD
HCT: 27.4 % — ABNORMAL LOW (ref 39.0–52.0)
Hemoglobin: 8.2 g/dL — ABNORMAL LOW (ref 13.0–17.0)

## 2019-03-11 MED ORDER — PHENOL 1.4 % MT LIQD
1.0000 | OROMUCOSAL | Status: DC | PRN
  Administered 2019-03-11: 03:00:00 1 via OROMUCOSAL
  Filled 2019-03-11: qty 177

## 2019-03-11 MED ORDER — ACETAMINOPHEN 325 MG PO TABS
650.0000 mg | ORAL_TABLET | ORAL | Status: DC | PRN
  Administered 2019-03-12 – 2019-03-13 (×3): 650 mg via ORAL
  Filled 2019-03-11 (×3): qty 2

## 2019-03-11 NOTE — Progress Notes (Addendum)
Pt again c/o "bladder spam." FC was hand irrigated and at least 50 ml blood clots were returned. Pt verbalized immediate relief. CBI continues to drain at a moderate rate but was sped up after this event. FC now draining clear, pink urine to drainage bag with no complications. Pt also has a temp of 100.5 at this time. Will continue to monitor closely.

## 2019-03-11 NOTE — Progress Notes (Signed)
Urology Inpatient Progress Report  BENIGN PROSTATIC HYPERPLASIA  Procedure(s): XI ROBOTIC ASSISTED SIMPLE PROSTATECTOMY  2 Days Post-Op   Intv/Subj: No acute events overnight. Patient is without complaint except for some mild pain.  He has not ambulated.  Hemoglobin is stable at 8.2.  Active Problems:   BPH with obstruction/lower urinary tract symptoms  Current Facility-Administered Medications  Medication Dose Route Frequency Provider Last Rate Last Dose  . allopurinol (ZYLOPRIM) tablet 100 mg  100 mg Oral Daily Debbrah Alar, PA-C   100 mg at 03/10/19 0949  . atorvastatin (LIPITOR) tablet 40 mg  40 mg Oral QHS Debbrah Alar, PA-C   40 mg at 03/10/19 2039  . Chlorhexidine Gluconate Cloth 2 % PADS 6 each  6 each Topical Daily McKenzie, Candee Furbish, MD   6 each at 03/10/19 [phone]  . colchicine tablet 0.6 mg  0.6 mg Oral Daily PRN Debbrah Alar, PA-C      . diphenhydrAMINE (BENADRYL) injection 12.5 mg  12.5 mg Intravenous Q6H PRN Dancy, Amanda, PA-C       Or  . diphenhydrAMINE (BENADRYL) 12.5 MG/5ML elixir 12.5 mg  12.5 mg Oral Q6H PRN Dancy, Amanda, PA-C      . docusate sodium (COLACE) capsule 100 mg  100 mg Oral BID Debbrah Alar, PA-C   100 mg at 03/10/19 2039  . finasteride (PROSCAR) tablet 5 mg  5 mg Oral Daily Debbrah Alar, PA-C   5 mg at 03/10/19 0949  . furosemide (LASIX) tablet 20 mg  20 mg Oral Daily Debbrah Alar, PA-C   20 mg at 03/10/19 0949  . HYDROmorphone (DILAUDID) injection 0.5-1 mg  0.5-1 mg Intravenous Q2H PRN Debbrah Alar, PA-C   1 mg at 03/11/19 0328  . lactated ringers infusion   Intravenous Continuous Lynda Rainwater, MD   Stopped at 03/09/19 1115  . metoprolol tartrate (LOPRESSOR) tablet 25 mg  25 mg Oral BID Debbrah Alar, PA-C   25 mg at 03/10/19 2039  . neomycin-bacitracin-polymyxin (NEOSPORIN) ointment packet 1 application  1 application Topical TID PRN Dancy, Amanda, PA-C      . ondansetron (ZOFRAN) injection 4 mg  4 mg Intravenous Q4H PRN Dancy, Amanda,  PA-C      . opium-belladonna (B&O SUPPRETTES) 16.2-60 MG suppository 1 suppository  1 suppository Rectal Q6H PRN Debbrah Alar, PA-C   1 suppository at 03/11/19 0330  . oxyCODONE (Oxy IR/ROXICODONE) immediate release tablet 5 mg  5 mg Oral Q4H PRN Dancy, Amanda, PA-C      . phenol (CHLORASEPTIC) mouth spray 1 spray  1 spray Mouth/Throat PRN Cleon Gustin, MD   1 spray at 03/11/19 0249  . sodium chloride irrigation 0.9 % 3,000 mL  3,000 mL Irrigation Continuous Dancy, Amanda, PA-C   3,000 mL at 03/10/19 1500  . tamsulosin (FLOMAX) capsule 0.4 mg  0.4 mg Oral Daily Debbrah Alar, PA-C   0.4 mg at 03/10/19 0949     Objective: Vital: Vitals:   03/10/19 1710 03/10/19 2029 03/11/19 0112 03/11/19 0343  BP: 126/90 (!) 144/95 (!) 133/96 109/64  Pulse: 64 71 76 84  Resp: 16 19 18 18   Temp: 98.2 F (36.8 C) 98.8 F (37.1 C) 99.1 F (37.3 C) 99.8 F (37.7 C)  TempSrc: Oral Oral Oral Oral  SpO2: 98% 97% 99% (!) 88%  Weight:      Height:       I/Os: I/O last 3 completed shifts: In: 36913.5 [P.O.:380; I.V.:2903.5; Blood:630; Other:33000] Out: [id number] LC:[id number]; Drains:90]  Physical Exam:  General: Patient is in no apparent distress Lungs: Normal respiratory effort, chest expands symmetrically. GI: Incisions are c/d/i. The abdomen is soft and appropriately tender without mass. JP drain with minimal serous drainage Foley: Light pink urine on very slow drip Ext: lower extremities symmetric  Lab Results: Recent Labs    03/10/19 0451 03/10/19 1855 03/11/19 0406  HGB 6.5* 7.7* 8.2*  HCT 22.3* 25.5* 27.4*   Recent Labs    03/10/19 0451  NA 137  K 3.8  CL 102  CO2 26  GLUCOSE 140*  BUN 30*  CREATININE 2.75*  CALCIUM 8.1*   No results for input(s): LABPT, INR in the last 72 hours. No results for input(s): LABURIN in the last 72 hours. Results for orders placed or performed during the hospital encounter of 03/06/19  Novel Coronavirus, NAA (Hosp order, Send-out to Ref Lab;  TAT 18-24 hrs     Status: None   Collection Time: 03/06/19  2:40 PM   Specimen: Nasopharyngeal Swab; Respiratory  Result Value Ref Range Status   SARS-CoV-2, NAA NOT DETECTED NOT DETECTED Final    Comment: (NOTE) This nucleic acid amplification test was developed and its performance characteristics determined by [employer]. Nucleic acid amplification tests include PCR and TMA. This test has not been FDA cleared or approved. This test has been authorized by FDA under an Emergency Use Authorization (EUA). This test is only authorized for the duration of time the declaration that circumstances exist justifying the authorization of the emergency use of in vitro diagnostic tests for detection of SARS-CoV-2 virus and/or diagnosis of COVID-19 infection under section 564(b)(1) of the Act, 21 U.S.C. [id number]) (1), unless the authorization is terminated or revoked sooner. When diagnostic testing is negative, the possibility of a false negative result should be considered in the context of a patient's recent exposures and the presence of clinical signs and symptoms consistent with COVID-19. An individual without symptoms of COVID- 19 and who is not shedding SARS-CoV-2 vi rus would expect to have a negative (not detected) result in this assay. Performed At: [facility] [address] [place], [place] [id number] Rush Farmer MD [id number]    [place]  Final    Comment: Performed at [facility], [place] [address], [place], [place] [address]    Studies/Results: No results found.  Assessment: BPH  Procedure(s): XI ROBOTIC ASSISTED SIMPLE PROSTATECTOMY, 2 Days Post-Op  doing well.  Plan: Advance diet to general Stop IV fluids Discontinue JP Continue Foley catheter Hold off on DVT prophylaxis given ongoing mild hematuria.  Continue SCDs. Ambulate today. Hemoglobin and hematocrit in the morning   Link Snuffer,  MD Urology 03/11/2019, 9:03 AM

## 2019-03-11 NOTE — Progress Notes (Signed)
Pt ambulated around 180 feet in hallway, needing only minimal assist and RW. Tolerated walk well, now sitting up in chair to eat breakfast. Does continue to c/o abd distention and discomfort. JP drain removed prior to walk. CBI continues to run at a moderate/slow rate. FC was hand irrigated prior to walk after pt c/o bladder pressure. Multiple large and small clots were returned. Urine now draining pink, clear to drainage bag. Will continue to monitor.

## 2019-03-12 LAB — HEMOGLOBIN AND HEMATOCRIT, BLOOD
HCT: 25.5 % — ABNORMAL LOW (ref 39.0–52.0)
Hemoglobin: 7.7 g/dL — ABNORMAL LOW (ref 13.0–17.0)

## 2019-03-12 LAB — SYNOVIAL CELL COUNT + DIFF, W/ CRYSTALS
Crystals, Fluid: NONE SEEN
Lymphocytes-Synovial Fld: 1 % (ref 0–20)
Monocyte-Macrophage-Synovial Fluid: 5 % — ABNORMAL LOW (ref 50–90)
Neutrophil, Synovial: 94 % — ABNORMAL HIGH (ref 0–25)
WBC, Synovial: 11600 /mm3 — ABNORMAL HIGH (ref 0–200)

## 2019-03-12 LAB — SURGICAL PATHOLOGY

## 2019-03-12 MED ORDER — METHYLPREDNISOLONE ACETATE 40 MG/ML IJ SUSP
40.0000 mg | Freq: Once | INTRAMUSCULAR | Status: AC
  Administered 2019-03-12: 40 mg via INTRAMUSCULAR
  Filled 2019-03-12 (×2): qty 1

## 2019-03-12 MED ORDER — LIDOCAINE HCL (PF) 1 % IJ SOLN
2.0000 mL | Freq: Once | INTRAMUSCULAR | Status: AC
  Administered 2019-03-12: 2 mL
  Filled 2019-03-12: qty 2

## 2019-03-12 MED ORDER — LIDOCAINE 1% INJECTION FOR CIRCUMCISION
2.0000 mL | INJECTION | Freq: Once | INTRAVENOUS | Status: DC
  Filled 2019-03-12: qty 2

## 2019-03-12 MED ORDER — METHYLPREDNISOLONE ACETATE 40 MG/ML IJ SUSP: 40.0000 mg | Freq: Once | INTRAMUSCULAR | Status: DC

## 2019-03-12 NOTE — Consult Note (Signed)
Reason for Consult: left knee pain Referring Physician: Dr. Nicolette Bang, MD  Lige Blanchette is an 68 y.o. male.  HPI: Till is admitted under the care of Dr. Alyson Ingles for ROBOTIC [place]. He has a history of gout and is currently on indomethacin and colchicine. He had gouty flare in the right knee which required aspiration and injection of cortisone a few weeks ago. He reports that the left knee has been bothering him in the past few days and "it feels like the other knee did". He reports that is bothers him at rest and when he walks. He denies injury to the left knee.   Past Medical History:  Diagnosis Date  . Anemia    normal Fe, nl B12, nl retic, nl EPO July '13  . Blood transfusion without reported diagnosis   . BPH (benign prostatic hyperplasia)   . Chronic back pain   . Chronic kidney disease    CKD III, obstructive nephropathy  . Diverticulosis   . Dysuria   . Elevated PSA, greater than or equal to 20 ng/ml June '13   PSA 107  . Hemorrhoids, internal, with bleeding, prolapse 09/19/2014  . Hyperlipidemia 05/29/2014  . Hypertension   . Obstructive uropathy 11/27/2015  . Renal insufficiency   . Tuberculosis    h/o PPD +  . UTI (urinary tract infection)   . Vitamin D deficiency 09/13/2017    Past Surgical History:  Procedure Laterality Date  . CARPAL TUNNEL RELEASE Right   . COLONOSCOPY    . HEMORRHOID BANDING    . SPLENECTOMY    . XI ROBOTIC ASSISTED SIMPLE PROSTATECTOMY N/A 03/09/2019   Procedure: XI ROBOTIC ASSISTED SIMPLE PROSTATECTOMY;  Surgeon: Cleon Gustin, MD;  Location: [facility];  Service: Urology;  Laterality: N/A;    Family History  Problem Relation Age of Onset  . Diabetes Mother   . Stroke Brother   . Hearing loss Brother   . Colon cancer Neg Hx   . Rectal cancer Neg Hx   . Stomach cancer Neg Hx   . Esophageal cancer Neg Hx     Social History:  reports that he quit smoking about 39 years ago. His smoking use included  cigarettes. He has never used smokeless tobacco. He reports that he does not drink alcohol or use drugs.  Allergies: No Known Allergies  Medications: I have reviewed the patient's current medications.  Results for orders placed or performed during the hospital encounter of 03/09/19 (from the past 48 hour(s))  Hemoglobin and hematocrit, blood     Status: Abnormal   Collection Time: 03/10/19  6:55 PM  Result Value Ref Range   Hemoglobin 7.7 (L) 13.0 - 17.0 g/dL   HCT 25.5 (L) 39.0 - 52.0 %    Comment: Performed at [facility], [place] [address], [place], [place] [address]  Hemoglobin and hematocrit, blood     Status: Abnormal   Collection Time: 03/11/19  4:06 AM  Result Value Ref Range   Hemoglobin 8.2 (L) 13.0 - 17.0 g/dL   HCT 27.4 (L) 39.0 - 52.0 %    Comment: Performed at [facility], [place] [address] [place], [place] [address]  Hemoglobin and hematocrit, blood     Status: Abnormal   Collection Time: 03/12/19  5:25 AM  Result Value Ref Range   Hemoglobin 7.7 (L) 13.0 - 17.0 g/dL   HCT 25.5 (L) 39.0 - 52.0 %    Comment: Performed at [facility], [place] [address], [place], [place] [address]    Review of Systems  Constitutional: Positive for malaise/fatigue. Negative for chills and fever.  Respiratory: Negative for cough and shortness of breath.   Cardiovascular: Negative for chest pain and palpitations.  Gastrointestinal: Negative for nausea and vomiting.  Genitourinary: Positive for hematuria.  Musculoskeletal: Positive for joint pain and myalgias. Negative for falls.  Neurological: Negative for tingling, sensory change and focal weakness.   Blood pressure 138/71, pulse (!) 102, temperature (!) 100.5 F (38.1 C), temperature source Oral, resp. rate 20, height 6\' 1"  (1.854 m), weight 110.7 kg, SpO2 96 %. Physical Exam  Constitutional: He is oriented to person, place, and time. He appears well-developed and well-nourished. No  distress.  HENT:  Head: Normocephalic and atraumatic.  Eyes: Conjunctivae and EOM are normal.  Neck: Normal range of motion.  Cardiovascular: Intact distal pulses.  Musculoskeletal:     Right hip: Normal.     Left hip: Normal.     Right knee: Normal.     Left knee: He exhibits decreased range of motion, swelling, effusion and erythema. Tenderness found.     Right ankle: Normal.     Left ankle: Normal.  Neurological: He is alert and oriented to person, place, and time. He has normal strength. No sensory deficit.  Skin: He is not diaphoretic. There is erythema.  Psychiatric: He has a normal mood and affect. His behavior is normal.    Assessment/Plan: Left knee pain secondary to likely gouty arthritis Due to patient's history of gouty flare after stressful events such as surgery, it is likely another gouty flare impacting the left knee like he experienced with the right knee a few weeks ago. Left knee was cleaned with alcohol and betadine and consent was received for injection and aspiration. Left knee injected with 2cc lidocaine and subsequently 80cc of synvoial fluid aspirated from the joint, with no obivous crystals. Then injected 2cc depo-medrol into the left knee, 80mg  total. Patient tolerated the procedure well and RN was advised to place ice on the left knee. WBAT for the left knee. Will send fluid to the lab for analysis of cell count and crystals. Remain on indocin and colchicine. Follow up with ortho as needed.   Jaeven Wanzer L Tiago Humphrey 03/12/2019, 3:32 PM

## 2019-03-12 NOTE — Progress Notes (Signed)
Pt unable to walk this morning due to HR in the 120s-130s.

## 2019-03-12 NOTE — Procedures (Signed)
After consent was obtained from the patient, the left knee was cleaned with betadine and injected intra-articularly with 2cc lidocaine. 80cc of normal appearing synovial fluid was aspirated from the left knee then 2cc depo-medrol (80mg  total) was injected to the knee. Injection site cleaned with alcohol and covered with a bandage. Patient tolerated the procedure well and was instructed to ice the knee this evening.     Ardeen Jourdain, PA-C

## 2019-03-12 NOTE — Progress Notes (Signed)
3 Days Post-Op Subjective: Patient reports worsening left knee pain and worse swelling. Positive flatus. Low grade fevers overnight. Urine clear on slow drip CBI  Objective: Vital signs in last 24 hours: Temp:  [99.3 F (37.4 C)-102.7 F (39.3 C)] 100.5 F (38.1 C) (10/05 1419) Pulse Rate:  [99-132] 102 (10/05 1419) Resp:  [15-30] 20 (10/05 1419) BP: (117-157)/(71-106) 138/71 (10/05 1419) SpO2:  [95 %-99 %] 96 % (10/05 1419)  Intake/Output from previous day: 10/04 0701 - 10/05 0700 In: [place] [P.O.:360] Out: 20850 [Urine:20850] Intake/Output this shift: Total I/O In: -  Out: 6525 [Urine:6525]  Physical Exam:  General:alert, cooperative and appears stated age GI: soft, non tender, normal bowel sounds, no palpable masses, no organomegaly, no inguinal hernia Male genitalia: not done Extremities: edema left knee]  Lab Results: Recent Labs    03/10/19 1855 03/11/19 0406 03/12/19 0525  HGB 7.7* 8.2* 7.7*  HCT 25.5* 27.4* 25.5*   BMET Recent Labs    03/10/19 0451  NA 137  K 3.8  CL 102  CO2 26  GLUCOSE 140*  BUN 30*  CREATININE 2.75*  CALCIUM 8.1*   No results for input(s): LABPT, INR in the last 72 hours. No results for input(s): LABURIN in the last 72 hours. Results for orders placed or performed during the hospital encounter of 03/06/19  Novel Coronavirus, NAA (Hosp order, Send-out to Ref Lab; TAT 18-24 hrs     Status: None   Collection Time: 03/06/19  2:40 PM   Specimen: Nasopharyngeal Swab; Respiratory  Result Value Ref Range Status   SARS-CoV-2, NAA NOT DETECTED NOT DETECTED Final    Comment: (NOTE) This nucleic acid amplification test was developed and its performance characteristics determined by [employer]. Nucleic acid amplification tests include PCR and TMA. This test has not been FDA cleared or approved. This test has been authorized by FDA under an Emergency Use Authorization (EUA). This test is only authorized for the duration of time  the declaration that circumstances exist justifying the authorization of the emergency use of in vitro diagnostic tests for detection of SARS-CoV-2 virus and/or diagnosis of COVID-19 infection under section 564(b)(1) of the Act, 21 U.S.C. [id number]) (1), unless the authorization is terminated or revoked sooner. When diagnostic testing is negative, the possibility of a false negative result should be considered in the context of a patient's recent exposures and the presence of clinical signs and symptoms consistent with COVID-19. An individual without symptoms of COVID- 19 and who is not shedding SARS-CoV-2 vi rus would expect to have a negative (not detected) result in this assay. Performed At: [facility] [address] [place], [place] [id number] Rush Farmer MD [id number]    [place]  Final    Comment: Performed at [facility], [place] [address], [place], [place] [address]    Studies/Results: No results found.  Assessment/Plan: POD#3 robotic simple prostatectomy  1. Wean CBI to off 2. Ortho consult for new left knee swelling and hx of gout.    LOS: 3 days   [name] 03/12/2019, 3:18 PM

## 2019-03-12 NOTE — Care Management Important Message (Signed)
Important Message  Patient Details IM Letter given to Dessa Phi RN to present to the Patient Name: [name] MRN: [id number] Date of Birth: 1950/10/31   Medicare Important Message Given:  Yes     Kerin Salen 03/12/2019, 11:16 AM

## 2019-03-12 NOTE — Progress Notes (Signed)
   03/12/19 0920  MEWS Score  Resp (!) 25  Pulse Rate (!) 132  BP (!) 157/106  Temp (!) 100.8 F (38.2 C)  Level of Consciousness Alert  SpO2 99 %  O2 Device Room Air  MEWS Score  MEWS RR 1  MEWS Pulse 3  MEWS Systolic 0  MEWS LOC 0  MEWS Temp 1  MEWS Score 5  MEWS Score Color Red  MEWS Assessment  Is this an acute change? Yes  MEWS guidelines implemented *See Row Information* Red  Rapid Response Notification  Name of Rapid Response RN Notified Christian RN   Date Rapid Response Notified 03/12/19  Time Rapid Response Notified [id number]  Provider Notification  Provider Name/Title McKenzie   Date Provider Notified 03/12/19  Time Provider Notified 0957  Notification Type Call  Notification Reason Change in status  Response Other (Comment) (MD coming to bedside)  Date of Provider Response 03/12/19  Time of Provider Response [phone]

## 2019-03-13 LAB — HEMOGLOBIN AND HEMATOCRIT, BLOOD
HCT: 26.2 % — ABNORMAL LOW (ref 39.0–52.0)
Hemoglobin: 8 g/dL — ABNORMAL LOW (ref 13.0–17.0)

## 2019-03-13 MED ORDER — HEPARIN SODIUM (PORCINE) 5000 UNIT/ML IJ SOLN
5000.0000 [IU] | Freq: Three times a day (TID) | INTRAMUSCULAR | Status: DC
  Administered 2019-03-13 – 2019-03-15 (×6): 5000 [IU] via SUBCUTANEOUS
  Filled 2019-03-13 (×6): qty 1

## 2019-03-13 MED ORDER — BISACODYL 10 MG RE SUPP
10.0000 mg | Freq: Once | RECTAL | Status: AC
  Administered 2019-03-13: 15:00:00 10 mg via RECTAL
  Filled 2019-03-13: qty 1

## 2019-03-13 NOTE — Progress Notes (Signed)
Pt urine [name] red in color. Hand irrigation performed with slight resistance but had return of light pink fluid.  CBI increased to fast rate with return of clear fluid.  Decreased CBI to moderate rate. Will continue to monitor. Pt c/o no discomfort during irrigation. Stacey Drain

## 2019-03-13 NOTE — Plan of Care (Signed)
  Problem: Education: Goal: Knowledge of General Education information will improve Description: Including pain rating scale, medication(s)/side effects and non-pharmacologic comfort measures Outcome: Completed/Met   Problem: Health Behavior/Discharge Planning: Goal: Ability to manage health-related needs will improve Outcome: Completed/Met   Problem: Activity: Goal: Risk for activity intolerance will decrease Outcome: Completed/Met   Problem: Nutrition: Goal: Adequate nutrition will be maintained Outcome: Completed/Met   Problem: Coping: Goal: Level of anxiety will decrease Outcome: Completed/Met   Problem: Pain Managment: Goal: General experience of comfort will improve Outcome: Completed/Met

## 2019-03-13 NOTE — Progress Notes (Signed)
4 Days Post-Op Subjective: Patient reports left knee pain is worse than yesterday.  He was unable to get up to ambulate this morning due to knee pain and stiffness.  It was successfully drained and then injected with depo-medrol yesterday.  LGF has improved this morning.  He has been getting Tylenol prn  He used IS some yesterday but has not used it this morning.  He is passing flatus with no BM since surgery.  Is tolerating a regular diet.   Urine slightly pink with very slow CBI drip  Objective: Vital signs in last 24 hours: Temp:  [99.5 F (37.5 C)-102.7 F (39.3 C)] 99.5 F (37.5 C) (10/06 0617) Pulse Rate:  [84-118] 87 (10/06 0617) Resp:  [15-30] 18 (10/06 0617) BP: (117-155)/(71-97) 123/84 (10/06 0617) SpO2:  [93 %-99 %] 93 % (10/06 0617)  Intake/Output from previous day: 10/05 0701 - 10/06 0700 In: [id number] [P.O.:600] Out: 21300 [Urine:21300] Intake/Output this shift: Total I/O In: 240 [P.O.:240] Out: 700 [Urine:700]  Physical Exam:  General:alert, cooperative and no distress GI: soft, non tender, no palpable masses Foley in place with slightly pink urine in tubing and bag Left knee diffusely tender more so on lateral aspect; moderate swelling; no noticeable erythema   Lab Results: Recent Labs    03/10/19 1855 03/11/19 0406 03/12/19 0525  HGB 7.7* 8.2* 7.7*  HCT 25.5* 27.4* 25.5*   BMET No results for input(s): NA, K, CL, CO2, GLUCOSE, BUN, CREATININE, CALCIUM in the last 72 hours. No results for input(s): LABPT, INR in the last 72 hours. No results for input(s): LABURIN in the last 72 hours. Results for orders placed or performed during the hospital encounter of 03/06/19  Novel Coronavirus, NAA (Hosp order, Send-out to Ref Lab; TAT 18-24 hrs     Status: None   Collection Time: 03/06/19  2:40 PM   Specimen: Nasopharyngeal Swab; Respiratory  Result Value Ref Range Status   SARS-CoV-2, NAA NOT DETECTED NOT DETECTED Final    Comment: (NOTE) This nucleic acid  amplification test was developed and its performance characteristics determined by [employer]. Nucleic acid amplification tests include PCR and TMA. This test has not been FDA cleared or approved. This test has been authorized by FDA under an Emergency Use Authorization (EUA). This test is only authorized for the duration of time the declaration that circumstances exist justifying the authorization of the emergency use of in vitro diagnostic tests for detection of SARS-CoV-2 virus and/or diagnosis of COVID-19 infection under section 564(b)(1) of the Act, 21 U.S.C. [id number]) (1), unless the authorization is terminated or revoked sooner. When diagnostic testing is negative, the possibility of a false negative result should be considered in the context of a patient's recent exposures and the presence of clinical signs and symptoms consistent with COVID-19. An individual without symptoms of COVID- 19 and who is not shedding SARS-CoV-2 vi rus would expect to have a negative (not detected) result in this assay. Performed At: [facility] [address] [place], [place] [id number] Rush Farmer MD [id number]    [place]  Final    Comment: Performed at [facility], [place] [address], [place], [place] [address]    Studies/Results: No results found.  Assessment/Plan: 4 Days Post-Op Procedure(s) (LRB): XI ROBOTIC ASSISTED SIMPLE PROSTATECTOMY (N/A)  Stable   Speak with Ortho regarding knee pain.  ? Expected course or does he need re-eval. Continue ice prn  Ambulate as much as possible.  Will ask PT to eval/tx  Recheck  H/H today to ensure stable  Start sub q heparin for DVT prophy.  Monitor H/H closely.  Dulcolax supp for constipation  Fever improved.  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 Instructed pt on aggressive IS today.  Continue Tylenol prn.  Monitor closely.    LOS: 4 days   Debbrah Alar 03/13/2019, 10:43 AM

## 2019-03-13 NOTE — Progress Notes (Signed)
4 Days Post-Op Subjective: Patient reports improved left knee pain after aspirationa nd injection yesterday Positive flatus. Low grade fevers overnight. Urine clear on slow drip CBI  Objective: Vital signs in last 24 hours: Temp:  [98.1 F (36.7 C)-100.5 F (38.1 C)] 98.1 F (36.7 C) (10/06 1300) Pulse Rate:  [84-108] 86 (10/06 1300) Resp:  [18-20] 20 (10/06 1300) BP: (118-139)/(75-87) 139/86 (10/06 1300) SpO2:  [93 %-100 %] 100 % (10/06 1300)  Intake/Output from previous day: 10/05 0701 - 10/06 0700 In: [id number] [P.O.:600] Out: 21300 [Urine:21300] Intake/Output this shift: No intake/output data recorded.  Physical Exam:  General:alert, cooperative and appears stated age GI: soft, non tender, normal bowel sounds, no palpable masses, no organomegaly, no inguinal hernia Male genitalia: not done Extremities: edema left knee]  Lab Results: Recent Labs    03/11/19 0406 03/12/19 0525 03/13/19 1144  HGB 8.2* 7.7* 8.0*  HCT 27.4* 25.5* 26.2*   BMET No results for input(s): NA, K, CL, CO2, GLUCOSE, BUN, CREATININE, CALCIUM in the last 72 hours. No results for input(s): LABPT, INR in the last 72 hours. No results for input(s): LABURIN in the last 72 hours. Results for orders placed or performed during the hospital encounter of 03/06/19  Novel Coronavirus, NAA (Hosp order, Send-out to Ref Lab; TAT 18-24 hrs     Status: None   Collection Time: 03/06/19  2:40 PM   Specimen: Nasopharyngeal Swab; Respiratory  Result Value Ref Range Status   SARS-CoV-2, NAA NOT DETECTED NOT DETECTED Final    Comment: (NOTE) This nucleic acid amplification test was developed and its performance characteristics determined by [employer]. Nucleic acid amplification tests include PCR and TMA. This test has not been FDA cleared or approved. This test has been authorized by FDA under an Emergency Use Authorization (EUA). This test is only authorized for the duration of time the declaration  that circumstances exist justifying the authorization of the emergency use of in vitro diagnostic tests for detection of SARS-CoV-2 virus and/or diagnosis of COVID-19 infection under section 564(b)(1) of the Act, 21 U.S.C. [id number]) (1), unless the authorization is terminated or revoked sooner. When diagnostic testing is negative, the possibility of a false negative result should be considered in the context of a patient's recent exposures and the presence of clinical signs and symptoms consistent with COVID-19. An individual without symptoms of COVID- 19 and who is not shedding SARS-CoV-2 vi rus would expect to have a negative (not detected) result in this assay. Performed At: [facility] [address] [place], [place] [id number] Rush Farmer MD [id number]    [place]  Final    Comment: Performed at [facility], [place] [address], [place], [place] [address]    Studies/Results: No results found.  Assessment/Plan: POD#3 robotic simple prostatectomy  1. Heparin gtt 2. Likely discharge tomorrow after PT evaluation   LOS: 4 days   Nicolette Bang 03/13/2019, 7:02 PM

## 2019-03-13 NOTE — Evaluation (Signed)
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  Also had L knee drained and medicated during stay.    PT Comments    Pt presents with above stated issues with biggest deficit being L knee pain.  He is overall at a S level with RW, however he does have a full flight of stairs in townhome to negotiate to get to restroom.  Pts roommate is not willing to assist, therefore he will need to be able to perform.  Will see tomorrow for stair assessment.  Pt will benefit from continued acute PT to address remaining deficits and recommend HHPT for follow up to return pt to baseline mobility.    Follow Up Recommendations  Home health PT;Supervision - Intermittent     Equipment Recommendations  None recommended by PT    Recommendations for Other Services       Precautions / Restrictions Precautions Precautions: Fall Precaution Comments: L knee pain/drained and medicated Restrictions Weight Bearing Restrictions: No    Mobility  Bed Mobility Overal bed mobility: Modified Independent             General bed mobility comments: Pt needs increased time, but able to do with HOB elevated at approx 20 deg  Transfers Overall transfer level: Needs assistance Equipment used: Rolling walker (2 wheeled) Transfers: Sit to/from Stand Sit to Stand: Supervision         General transfer comment: S for safety from elevated surface.  Ambulation/Gait Ambulation/Gait assistance: Supervision Gait Distance (Feet): 200 Feet Assistive device: Rolling walker (2 wheeled) Gait Pattern/deviations: Step-through pattern;Decreased stride length;Antalgic;Trunk flexed     General Gait Details: Provided cues for keeping RW closer for safety as he did state he feels L knee is unstable at times.  Provided education regarding stairs.   He was not willing to try stairs today, but suggested PT see tomorrow to try them, otherwise he will need to stay downstairs, however doesn't have restroom downstairs.   Stairs             Wheelchair Mobility    Modified Rankin (Stroke Patients Only)       Balance Overall balance assessment: Mild deficits observed, not formally tested                                          Cognition Arousal/Alertness: Awake/alert Behavior During Therapy: WFL for tasks assessed/performed Overall Cognitive Status: Within Functional Limits for tasks assessed                                        Exercises      General Comments        Pertinent Vitals/Pain Pain Assessment: 0-10 Pain Score: 8  Pain Location: L knee Pain Descriptors / Indicators: Aching Pain Intervention(s): Limited activity within patient's tolerance;Monitored during session;Ice applied    Home Living Family/patient expects to be discharged to:: Private residence Living Arrangements: Other (Comment)(roommate) Available Help at Discharge: (reports roommate doesn't help) Type of Home: Other(Comment)(townhome) Home Access: Stairs to enter Entrance Stairs-Rails: None Home Layout: Two level Home Equipment: Cane - single point Additional Comments: SPC in room but needs new tip  Prior Function Level of Independence: Independent      Comments: has been using cane about 7 years   PT Goals (current goals can now be found in the care plan section) Acute Rehab PT Goals Patient Stated Goal: to return home as able PT Goal Formulation: With patient Time For Goal Achievement: 03/20/19 Potential to Achieve Goals: Good    Frequency    Min 3X/week      PT Plan      Co-evaluation              AM-PAC PT "6 Clicks" Mobility   Outcome Measure  Help needed turning from your back to your side while in a flat bed without using bedrails?: None Help needed moving from lying  on your back to sitting on the side of a flat bed without using bedrails?: None Help needed moving to and from a bed to a chair (including a wheelchair)?: A Little Help needed standing up from a chair using your arms (e.g., wheelchair or bedside chair)?: A Little Help needed to walk in hospital room?: A Little Help needed climbing 3-5 steps with a railing? : A Little 6 Click Score: 20    End of Session Equipment Utilized During Treatment: Gait belt Activity Tolerance: Patient tolerated treatment well Patient left: in chair;with call bell/phone within reach Nurse Communication: Mobility status PT Visit Diagnosis: Muscle weakness (generalized) (M62.81);Difficulty in walking, not elsewhere classified (R26.2);Other abnormalities of gait and mobility (R26.89)     Time: 1410-1434 PT Time Calculation (min) (ACUTE ONLY): 24 min  Charges:  $Gait Training: 8-22 mins                    Cameron Sprang, PT, MPT  03/13/19, 3:15 PM    Aria Jarrard, Betha Loa 03/13/2019, 3:13 PM

## 2019-03-13 NOTE — Progress Notes (Signed)
Attempted to get patient out of bed to ambulate. Pt unable to stand, states left leg is very weak and has increased amount of pain present. Pt able to dangle on the side of the bed for 15 minutes. PRN colchicine and oxy given to pt. Ice applied to left knee. Pt assisted back to bed with maximum assist. Will continue to monitor and pass on to oncoming RN.

## 2019-03-14 NOTE — Progress Notes (Signed)
5 Days Post-Op Subjective: Patient reports improved left knee pain after aspiration and injection  Positive flatus and BM. Urine clear off CBI  Objective: Vital signs in last 24 hours: Temp:  [98.5 F (36.9 C)-99.4 F (37.4 C)] 98.5 F (36.9 C) (10/07 1313) Pulse Rate:  [77-97] 77 (10/07 1313) Resp:  [18-20] 18 (10/07 1313) BP: (112-151)/(72-89) 112/72 (10/07 1313) SpO2:  [96 %-98 %] 98 % (10/07 1313)  Intake/Output from previous day: 10/06 0701 - 10/07 0700 In: 11840 [P.O.:240] Out: [id number] [Urine:14375] Intake/Output this shift: Total I/O In: 480 [P.O.:480] Out: 600 [Urine:600]  Physical Exam:  General:alert, cooperative and appears stated age GI: soft, non tender, normal bowel sounds, no palpable masses, no organomegaly, no inguinal hernia Male genitalia: not done Extremities: edema left knee]  Lab Results: Recent Labs    03/12/19 0525 03/13/19 1144  HGB 7.7* 8.0*  HCT 25.5* 26.2*   BMET No results for input(s): NA, K, CL, CO2, GLUCOSE, BUN, CREATININE, CALCIUM in the last 72 hours. No results for input(s): LABPT, INR in the last 72 hours. No results for input(s): LABURIN in the last 72 hours. Results for orders placed or performed during the hospital encounter of 03/06/19  Novel Coronavirus, NAA (Hosp order, Send-out to Ref Lab; TAT 18-24 hrs     Status: None   Collection Time: 03/06/19  2:40 PM   Specimen: Nasopharyngeal Swab; Respiratory  Result Value Ref Range Status   SARS-CoV-2, NAA NOT DETECTED NOT DETECTED Final    Comment: (NOTE) This nucleic acid amplification test was developed and its performance characteristics determined by [employer]. Nucleic acid amplification tests include PCR and TMA. This test has not been FDA cleared or approved. This test has been authorized by FDA under an Emergency Use Authorization (EUA). This test is only authorized for the duration of time the declaration that circumstances exist justifying the authorization  of the emergency use of in vitro diagnostic tests for detection of SARS-CoV-2 virus and/or diagnosis of COVID-19 infection under section 564(b)(1) of the Act, 21 U.S.C. [id number]) (1), unless the authorization is terminated or revoked sooner. When diagnostic testing is negative, the possibility of a false negative result should be considered in the context of a patient's recent exposures and the presence of clinical signs and symptoms consistent with COVID-19. An individual without symptoms of COVID- 19 and who is not shedding SARS-CoV-2 vi rus would expect to have a negative (not detected) result in this assay. Performed At: [facility] [address] [place], [place] [id number] Rush Farmer MD [id number]    [place]  Final    Comment: Performed at [facility], [place] [address] [place], [place] [address]    Studies/Results: No results found.  Assessment/Plan: POD#3 robotic simple prostatectomy  1. CBI off 2. Continue to ambulate with assistance 3. Likely discharge tomorrow   LOS: 5 days   Nicolette Bang 03/14/2019, 4:06 PM

## 2019-03-14 NOTE — Consult Note (Signed)
   [facility] CM Inpatient Consult   03/14/2019  [name] 28-Aug-1950 [id number]   Patient chart has been reviewed for readmissions less than 30 days and for high risk score, 22%, for unplanned readmissions.  Patient assessed for community [place] Management follow up needs.   Spoke with [name] by telephone. HIPAA verified.  Patient had been engaged by a [place] community RN prior to this hospital admission and case closed when member admitted to hospital. Reviewed [facility] CM services with [name]. He states that he would like to receive [facility] CM RN follow up when he transitions home. States that he lives with a roommate and has no family living nearby.   Patient is appreciative of any assistance. [facility] CM referral will be placed for community follow up.   Of note, [facility] Care Management services does not replace or interfere with any services that are arranged by inpatient case management or social work.  Netta Cedars, MSN, [facility] Liaison Nurse Mobile Phone [phone]  Toll free office [phone]

## 2019-03-14 NOTE — Progress Notes (Signed)
Foley cathter remains intact. CBI clamped by provider. Urine is maroon in color, no clots. Patient has been bladder scanned 6 cc.

## 2019-03-14 NOTE — Progress Notes (Signed)
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  Also had L knee drained and medicated during stay.    PT Comments    Pt in bed with bladder irrigation clamped but still assembled so unable to attempt a flight as stairs as LPT requested.  Assisted pt OOB to amb.  General bed mobility comments: Pt needs increased time, but able to do with HOB elevated.  General transfer comment: good safety cognition and use of hands to steady self.  General Gait Details: trial gait with previous AD of SPC pt VERY unsteady with decreased WBing self tolerance L LE due to knee pain 8/10requiring increased assist..  Amb with increased ability using RW safely at Supervision level and 25% VC's proper walker to self distance esp with turns. Rec pt use his walker vs cane.  Will attempt to see in am for flight stairs.    Follow Up Recommendations  Home health PT;Supervision - Intermittent     Equipment Recommendations  None recommended by PT(has a walker)    Recommendations for Other Services       Precautions / Restrictions Precautions Precautions: Fall Precaution Comments: L knee pain/drained and medicated 03/12/19 Restrictions Weight Bearing Restrictions: No Other Position/Activity Restrictions: WBAT    Mobility  Bed Mobility Overal bed mobility: Modified Independent             General bed mobility comments: Pt needs increased time, but able to do with HOB elevated  Transfers Overall transfer level: Needs assistance Equipment used: Rolling walker (2 wheeled);None Transfers: Sit to/from [facility] Sit to Stand: Supervision Stand pivot transfers: Supervision;Min guard       General transfer comment: good safety cognition and use of hands to steady self  Ambulation/Gait Ambulation/Gait  assistance: Supervision;Min guard;Min assist Gait Distance (Feet): 74 Feet Assistive device: Rolling walker (2 wheeled);Straight cane Gait Pattern/deviations: Step-through pattern;Decreased stride length;Antalgic;Trunk flexed Gait velocity: varied   General Gait Details: trial gait with previous AD of SPC pt VERY unsteady with decreased WBing self tolerance L LE due to knee pain 8/10requiring increased assist..  Amb with increased ability using RW safely at Supervision level and 25% VC's proper walker to self distance esp with turns.   Stairs             Wheelchair Mobility    Modified Rankin (Stroke Patients Only)       Balance                                            Cognition Arousal/Alertness: Awake/alert Behavior During Therapy: WFL for tasks assessed/performed Overall Cognitive Status: Within Functional Limits for tasks assessed                                 General Comments: pleasant      Exercises      General Comments        Pertinent Vitals/Pain Pain Assessment: 0-10 Pain Score: 8  Pain Location: L knee Pain Descriptors / Indicators: Aching Pain Intervention(s): Monitored during session;Patient requesting pain meds-RN notified    Home Living  Prior Function            PT Goals (current goals can now be found in the care plan section) Progress towards PT goals: Progressing toward goals    Frequency    Min 3X/week      PT Plan Current plan remains appropriate    Co-evaluation              AM-PAC PT "6 Clicks" Mobility   Outcome Measure  Help needed turning from your back to your side while in a flat bed without using bedrails?: None Help needed moving from lying on your back to sitting on the side of a flat bed without using bedrails?: None Help needed moving to and from a bed to a chair (including a wheelchair)?: A Little Help needed standing up from a chair  using your arms (e.g., wheelchair or bedside chair)?: A Little Help needed to walk in hospital room?: A Little Help needed climbing 3-5 steps with a railing? : A Lot 6 Click Score: 19    End of Session Equipment Utilized During Treatment: Gait belt Activity Tolerance: Patient tolerated treatment well Patient left: in bed;with bed alarm set;with nursing/sitter in room Nurse Communication: Mobility status PT Visit Diagnosis: Muscle weakness (generalized) (M62.81);Difficulty in walking, not elsewhere classified (R26.2);Other abnormalities of gait and mobility (R26.89)     Time: 1436-1500 PT Time Calculation (min) (ACUTE ONLY): 24 min  Charges:  $Gait Training: 8-22 mins $Therapeutic Activity: 8-22 mins                     Rica Koyanagi  PTA Acute  Rehabilitation Services Pager      [phone] Office      [phone]

## 2019-03-14 NOTE — Plan of Care (Signed)
Continue current POC 

## 2019-03-15 ENCOUNTER — Other Ambulatory Visit: Payer: Self-pay | Admitting: *Deleted

## 2019-03-15 NOTE — Progress Notes (Addendum)
No change from am assessment. FOley catheter draining well, no clots. Maroon in color clear.  Discharge instructions reviewed,. Foley care instructions provided also. Questions, concerns denied. Supplies provided for [facility] care.

## 2019-03-15 NOTE — TOC Transition Note (Signed)
Transition of Care [facility]) - CM/SW Discharge Note   Patient Details  Name: [name] MRN: [id number] Date of Birth: 04-22-1951  Transition of [facility]) CM/SW Contact:  Dessa Phi, RN Phone Number: 03/15/2019, 12:42 PM   Clinical Narrative:   Awaiting HHPT order,face to face from urology-TC office.     Final next level of care: Home w Home Health Services Barriers to Discharge: Other (comment)   Patient Goals and CMS Choice Patient states their goals for this hospitalization and ongoing recovery are:: go home CMS Medicare.gov Compare Post Acute Care list provided to:: Patient    Discharge Placement                       Discharge Plan and Services                          HH Arranged: PT [place] Agency: [place]: [phone] Representative spoke with at [place]: [place] ([place]) Interventions     Readmission Risk Interventions Readmission Risk Prevention Plan 02/13/2019  Transportation Screening Complete  PCP or Specialist Appt within 3-5 Days Not Complete  Not Complete comments not ready for dc  HRI or [place] Complete  Social Work Consult for [place] Planning/Counseling Complete  Palliative Care Screening Not Applicable  Medication Review Press photographer) Complete  Some recent data might be hidden

## 2019-03-15 NOTE — Care Management Important Message (Signed)
Important Message  Patient Details IM Letter given to Dessa Phi RN to present to the Patient Name: [name] MRN: [id number] Date of Birth: [birth year]   Medicare Important Message Given:  Yes     Kerin Salen 03/15/2019, 10:02 AM

## 2019-03-15 NOTE — Progress Notes (Addendum)
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  Also had L knee drained.    PT Comments    Pt was in recliner with bladder irrigation clamped but still assembled so unable to attempt stairs as LPT requested. Pt demonstrated great motivation and was able to ambulate 200 ft. with RW min guard. Posture and gait speed has improved, still has increase with L knee pain due to gout flare up.   Follow Up Recommendations  Home health PT;Supervision - Intermittent     Equipment Recommendations  None recommended by PT    Recommendations for Other Services       Precautions / Restrictions Precautions Precautions: None Restrictions Weight Bearing Restrictions: No    Mobility  Bed Mobility               General bed mobility comments: OOB in recliner  Transfers Overall transfer level: Needs assistance Equipment used: Rolling walker (2 wheeled);None Transfers: Sit to/from Stand Sit to Stand: Modified independent (Device/Increase time)    Transfer details: Pt was 100% WB with pain in L knee. Pt requred assistance handling bladder irrigation and catheter bag.        Ambulation/Gait Ambulation/Gait assistance: Supervision;Min guard Gait Distance (Feet): 200 Feet Assistive device: Rolling walker (2 wheeled) Gait Pattern/deviations: Step-through pattern;Decreased stance time - left Gait velocity: WNL   General Gait Details: Pt. demonstrated increased gait speed and improved posture while ambulating. Need for RW vs. can due to increased pain in L knee due to gout flare up.   Stairs Stairs: (Unable to perform stirs due to bladder irrigation set up)           Wheelchair Mobility    Modified Rankin (Stroke Patients Only)       Balance                                             Cognition Arousal/Alertness: Awake/alert Behavior During Therapy: WFL for tasks assessed/performed Overall Cognitive Status: Within Functional Limits for tasks assessed                                 General Comments: pleasant      Exercises      General Comments        Pertinent Vitals/Pain Pain Assessment: 0-10 Pain Score: 8  Pain Location: L knee Pain Descriptors / Indicators: Tightness;Grimacing Pain Intervention(s): Patient requesting pain meds-RN notified;Repositioned    Home Living                      Prior Function            PT Goals (current goals can now be found in the care plan section)      Frequency    Min 3X/week      PT Plan Current plan remains appropriate    Co-evaluation              AM-PAC PT "6 Clicks" Mobility   Outcome Measure  Help needed turning from your back to your side while in a flat bed without using bedrails?: None Help needed moving from lying on your back to  sitting on the side of a flat bed without using bedrails?: None Help needed moving to and from a bed to a chair (including a wheelchair)?: None Help needed standing up from a chair using your arms (e.g., wheelchair or bedside chair)?: None Help needed to walk in hospital room?: A Little Help needed climbing 3-5 steps with a railing? : A Lot 6 Click Score: 21    End of Session Equipment Utilized During Treatment: Gait belt Activity Tolerance: Patient tolerated treatment well Patient left: in chair;with call bell/phone within reach   PT Visit Diagnosis: Muscle weakness (generalized) (M62.81);Difficulty in walking, not elsewhere classified (R26.2);Other abnormalities of gait and mobility (R26.89)     Time: [id number] PT Time Calculation (min) (ACUTE ONLY): 24 min  Charges:  $Gait Training: 8-22 mins $Therapeutic Activity: 8-22 mins                     Excell Seltzer, SPTA [place] [facility]   Reviewed, discussed and  agree with above  Rica Koyanagi  PTA Acute  Rehabilitation Services Pager      [phone] Office      [phone]

## 2019-03-15 NOTE — TOC Progression Note (Signed)
Transition of Care [facility]) - Progression Note    Patient Details  Name: [name] MRN: [id number] Date of Birth: 1951/04/23  Transition of Care [facility]) CM/SW Contact  Deaisha Welborn, Juliann Pulse, RN Phone Number: 03/15/2019, 12:12 PM  Clinical Narrative:  Awaiting HHPT order, & face to face-attending notified through [facility]. [facility]-HH[facility] rep Dian Situ able to provide service early next week.          Expected Discharge Plan and Services           Expected Discharge Date: 03/15/19                                     Social Determinants of Health (SDOH) Interventions    Readmission Risk Interventions Readmission Risk Prevention Plan 02/13/2019  Transportation Screening Complete  PCP or Specialist Appt within 3-5 Days Not Complete  Not Complete comments not ready for dc  HRI or [place] Complete  Social Work Consult for [place] Planning/Counseling Complete  Palliative Care Screening Not Applicable  Medication Review Press photographer) Complete  Some recent data might be hidden

## 2019-03-15 NOTE — TOC Progression Note (Signed)
Transition of Care [facility]) - Progression Note    Patient Details  Name: [name] MRN: [id number] Date of Birth: Aug 15, 1950  Transition of Care [facility]) CM/SW Contact  Rithwik Schmieg, Juliann Pulse, RN Phone Number: 03/15/2019, 10:42 AM  Clinical Narrative:  03/15/2019 Medicare Home health results [email or web address] ASC&paging=114 1/2 Close window Home health agencies that serve [phone]. [place] Quality of Patient Care Rating Patient Survey Summary Rating [facility] [phone] [place] [phone] [place] [phone] [place] [phone] [place] [phone] [place] [phone] [facility] [place] [phone] [place] [phone] [facility] [phone] Not Available12 [facility] (336) [phone] [place] [phone] 3 out of 5 stars 4 out of 5 stars 4  out of 5 stars 3 out of 5 stars 4 out of 5 stars 4 out of 5 stars 4  out of 5 stars 4 out of 5 stars 4 out of 5 stars 4 out of 5 stars 4 out of 5 stars 4 out of 5 stars 3  out of 5 stars 4 out of 5 stars 3 out of 5 stars 4 out of 5 stars 4 out of 5 stars 3  out of 5 stars 3 out of 5 stars 3  out of 5 stars 4 out of 5 stars 03/15/2019 Medicare Home health results [employer] ASC&paging=114 2/2 [place] of Patient Care Rating Patient Survey Summary Rating [place] [phone] [place] [phone] Not [place] [phone] National number Footnote as displayed on  [place] 3  out of 5 stars 3 out of 5 stars 3  out of 5 stars 4  out of 5 stars 3 out of 5 stars          Expected Discharge Plan and Services                                                 Social Determinants of Health (SDOH) Interventions    Readmission Risk Interventions Readmission Risk Prevention Plan 02/13/2019  Transportation Screening Complete  PCP or Specialist Appt within 3-5 Days Not Complete  Not Complete comments not ready for dc  HRI or [place] Complete  Social Work Consult for [place] Planning/Counseling Complete  Palliative Care Screening Not Applicable  Medication Review Press photographer) Complete  Some recent data might be hidden

## 2019-03-15 NOTE — Patient Outreach (Signed)
Transtion of care call #1. Pt discharged from the hospital today.   Pt reports he has had a prostatecomy, home with a foley catheter. He also has had a gout flare in the L knee had had it aspirated and a steroid injection before coming home.   He is home alone. He does have a room mate but he will not depend on him to assist him. He has all his meds except his pain medication which he will have someone pick it up tomorrow. He has a follow up appt next week.  We discussed his catheter care and prevention of pulling on the tubing.  Encouraged pt to use his walker to be steady and not put full weight on his painful L knee.  PT to start next week.  Instructed pt to call me if any problems.  THN CM Care Plan Problem One     Most Recent Value  Care Plan Problem One  potential for readmission due to recent hospitalization  (Pended)   Role Documenting the Problem One  Care Management Telephonic Coordinator  (Pended)   [place] for Problem One  Active  (Pended)   THN Long Term Goal   patient will report no hospital readmission within 45 days  (Pended)   [place] Term Goal Start Date  02/26/19  (Pended)   Interventions for Problem One Long Term Goal  Will restart this goal from today. Pt instructed to call if any problems for advice and to avoid hospitalizaton.  (Pended)     THN CM Care Plan Problem Two     Most Recent Value  Care Plan Problem Two  Knowledge deficit related to simple prostatectomy  (Pended)   Role Documenting the Problem Two  Care Management Telephonic Coordinator  (Pended)   Care Plan for Problem Two  Active  (Pended)   THN CM Short Term Goal #1   Patient wil be able to verbalize contact information for provider post surgical procedure within 2 weeks   (Pended)   THN CM Short Term Goal #1 Start Date  02/26/19  (Pended)   THN CM Short Term Goal #1 Met Date   03/07/19  (Pended)   THN CM Short Term Goal #2   patient will be able to state at least 3 signs/ symptoms of infection  within 2 weeks.   (Pended)   THN CM Short Term Goal #2 Start Date  03/07/19  (Pended)   Interventions for Short Term Goal #2  Not specifically addressed in this call today. Advised if any noted problems to call me.  (Pended)     THN CM Care Plan Problem Three     Most Recent Value  Care Plan Problem Three  High risk for falls due to painful gout in L knee.  (Pended)   Role Documenting the Problem Three  Care Management Coordinator  (Pended)   Care Plan for Problem Three  Active  (Pended)      [name] Myrtie Neither, MSN, [facility] Gerontological Nurse Practitioner [facility] Care Management [phone]

## 2019-03-16 ENCOUNTER — Telehealth: Payer: Self-pay | Admitting: *Deleted

## 2019-03-16 NOTE — Telephone Encounter (Signed)
Pt was on TCM reprot admitted 03/09/19 for BPH with obstruction/lower urinary tract symptoms. Pt underwent a ROBOTIC ASSISTED SIMPLE PROSTATECTOMY which he tolerated it well. Pt D/C 03/15/19, and will follow-up w/urologist 03/23/19.Marland KitchenJohny Chess

## 2019-03-19 ENCOUNTER — Other Ambulatory Visit: Payer: Self-pay

## 2019-03-19 ENCOUNTER — Other Ambulatory Visit: Payer: Self-pay | Admitting: *Deleted

## 2019-03-19 NOTE — Patient Outreach (Signed)
Telephone call. Pt home. He has his pain medication now.   Brief home visit to assure pt is in safe place with food.  VS:  140/70 Pulse 84 Resp 18 RRR Lungs clear Leg back in place with clear yellow urine  Patient was recently discharged from hospital and all medications have been reviewed. Outpatient Encounter Medications as of 03/19/2019  Medication Sig Note  . acetaminophen (TYLENOL) 500 MG tablet Take 1,000 mg by mouth every 6 (six) hours as needed for moderate pain.    Marland Kitchen allopurinol (ZYLOPRIM) 100 MG tablet Take 2 tablets (200 mg total) by mouth daily. (Patient taking differently: Take 100 mg by mouth daily. )   . atorvastatin (LIPITOR) 40 MG tablet Take 1 tablet by mouth once daily (Patient taking differently: Take 40 mg by mouth at bedtime. )   . colchicine 0.6 MG tablet Take 0.6 mg by mouth daily as needed (gout).    . finasteride (PROSCAR) 5 MG tablet TAKE 1 TABLET BY MOUTH ONCE DAILY (Patient taking differently: Take 5 mg by mouth daily. )   . furosemide (LASIX) 20 MG tablet Take 20 mg by mouth daily.   Marland Kitchen HYDROcodone-acetaminophen (NORCO) 5-325 MG tablet Take 1-2 tablets by mouth every 6 (six) hours as needed for moderate pain or severe pain.   Marland Kitchen Hyoscyamine Sulfate SL (LEVSIN/SL) 0.125 MG SUBL Place 0.125 mg under the tongue every 6 (six) hours as needed (bladder spasm).   . metoprolol tartrate (LOPRESSOR) 25 MG tablet Take 1 tablet by mouth twice daily   . Potassium 99 MG TABS Take 99 mg by mouth daily.  11/04/2018: OTC  - unknown strength  . sulfamethoxazole-trimethoprim (BACTRIM DS) 800-160 MG tablet Take 1 tablet by mouth 2 (two) times daily. Start the day prior to foley removal appointment    No facility-administered encounter medications on file as of 03/19/2019.    Fall Risk  03/20/2019 02/26/2019 07/04/2018 06/30/2017 06/28/2017  Falls in the past year? 1 1 0 Yes No  Number falls in past yr: 0 0 - 1 -  Injury with Fall? 0 0 - No -  Risk for fall due to : History of  fall(s);Impaired balance/gait;Medication side effect;Impaired mobility History of fall(s) - - -  Follow up Falls evaluation completed;Education provided;Falls prevention discussed Falls prevention discussed - - -   Depression screen [facility] 2/9 07/04/2018 06/30/2017 06/28/2017 06/28/2017 04/18/2017  Decreased Interest 0 0 1 1 0  Down, Depressed, Hopeless - 0 0 0 0  PHQ - 2 Score 0 0 1 1 0  Some recent data might be hidden   [facility] CM Care Plan Problem One     Most Recent Value  Care Plan Problem One  potential for readmission due to recent hospitalization  Role Documenting the Problem One  Care Management Telephonic [place] for Problem One  Active  [facility] Long Term Goal   patient will report no hospital readmission within 45 days  THN Long Term Goal Start Date  02/26/19  Interventions for Problem One Long Term Goal  Will follow pt weekly to identify any obstacles or problems.    [facility] CM Care Plan Problem Two     Most Recent Value  Care Plan Problem Two  Knowledge deficit related to simple prostatectomy  Role Documenting the Problem Two  Care Management Telephonic Coordinator  Care Plan for Problem Two  Not Active  [facility] CM Short Term Goal #1   Patient wil be able to verbalize contact information for provider  post surgical procedure within 2 weeks   THN CM Short Term Goal #1 Start Date  02/26/19  [facility] CM Short Term Goal #1 Met Date   03/07/19  THN CM Short Term Goal #2   patient will be able to state at least 3 signs/ symptoms of infection within 2 weeks.   THN CM Short Term Goal #2 Start Date  03/07/19    [facility] CM Care Plan Problem Three     Most Recent Value  Care Plan Problem Three  High risk for falls due to painful gout in L knee.  Role Documenting the Problem Three  Care Management Coordinator  Care Plan for Problem Three  Active  THN Long Term Goal   Pt will not report fall with injury over the next 45 days, .  THN Long Term Goal Start Date  03/15/19  Interventions for Problem  Three Long Term Goal  Reinforced the importance of personal safety and making cautious decisions about walking safely.     Pt has follow up appt scheduled with urology, has transportation.  I will continue to call once a week this month to ensure pt stablilty and support.  [name] Myrtie Neither, MSN, [facility] Gerontological Nurse Practitioner [facility] Care Management [phone]

## 2019-03-20 ENCOUNTER — Encounter: Payer: Self-pay | Admitting: *Deleted

## 2019-03-20 NOTE — Discharge Summary (Signed)
Physician Discharge Summary  Patient ID: [name] MRN: [id number] DOB/AGE: 1951-03-02 68 y.o.  Admit date: 03/09/2019 Discharge date: 03/15/2019  Admission Diagnoses:  Urinary retention  Discharge Diagnoses:  Active Problems:   BPH with obstruction/lower urinary tract symptoms   Past Medical History:  Diagnosis Date  . Anemia    normal Fe, nl B12, nl retic, nl EPO July '13  . Blood transfusion without reported diagnosis   . BPH (benign prostatic hyperplasia)   . Chronic back pain   . Chronic kidney disease    CKD III, obstructive nephropathy  . Diverticulosis   . Dysuria   . Elevated PSA, greater than or equal to 20 ng/ml June '13   PSA 107  . Hemorrhoids, internal, with bleeding, prolapse 09/19/2014  . Hyperlipidemia 05/29/2014  . Hypertension   . Obstructive uropathy 11/27/2015  . Renal insufficiency   . Tuberculosis    h/o PPD +  . UTI (urinary tract infection)   . Vitamin D deficiency 09/13/2017    Surgeries: Procedure(s): XI ROBOTIC ASSISTED SIMPLE PROSTATECTOMY on 03/09/2019   Consultants (if any):  Dr. Elmyra Ricks  Discharged Condition: Improved  Hospital Course: [name] is an 68 y.o. male who was admitted 03/09/2019 with a diagnosis of BPH with Urinary retention and went to the operating room on 03/09/2019 and underwent the above named procedures.  He developed left knee swelling and was diagnosed with gout. His urine was clear on POD#4. He developed difficulty ambulating and PT was consulted. He is scheduled with home health PT  He was given perioperative antibiotics:  Anti-infectives (From admission, onward)   Start     Dose/Rate Route Frequency Ordered Stop   03/09/19 0536  ceFAZolin (ANCEF) IVPB 2g/100 mL premix     2 g 200 mL/hr over 30 Minutes Intravenous 30 min pre-op 03/09/19 0536 03/09/19 0805   03/09/19 0000  sulfamethoxazole-trimethoprim (BACTRIM DS) 800-160 MG tablet     1 tablet Oral 2 times daily 03/09/19 0739      .  He was given  sequential compression devices, early ambulation for DVT prophylaxis.  He benefited maximally from the hospital stay and there were no complications.    Recent vital signs:  Vitals:   03/15/19 0921 03/15/19 1059  BP: (!) 137/92 112/63  Pulse: 69 77  Resp:  18  Temp:  98.4 F (36.9 C)  SpO2:  98%    Recent laboratory studies:  Lab Results  Component Value Date   HGB 8.0 (L) 03/13/2019   HGB 7.7 (L) 03/12/2019   HGB 8.2 (L) 03/11/2019   Lab Results  Component Value Date   WBC 6.5 03/05/2019   PLT 440 (H) 03/05/2019   Lab Results  Component Value Date   INR 1.2 02/09/2019   Lab Results  Component Value Date   NA 137 03/10/2019   K 3.8 03/10/2019   CL 102 03/10/2019   CO2 26 03/10/2019   BUN 30 (H) 03/10/2019   CREATININE 2.75 (H) 03/10/2019   GLUCOSE 140 (H) 03/10/2019    Discharge Medications:   Allergies as of 03/15/2019   No Known Allergies     Medication List    STOP taking these medications   aspirin EC 81 MG tablet   cholecalciferol 25 MCG (1000 UT) tablet Commonly known as: VITAMIN D3   lidocaine 2 % jelly Commonly known as: XYLOCAINE   tamsulosin 0.4 MG Caps capsule Commonly known as: FLOMAX   TART CHERRY ADVANCED PO     TAKE these  medications   acetaminophen 500 MG tablet Commonly known as: TYLENOL Take 1,000 mg by mouth every 6 (six) hours as needed for moderate pain.   allopurinol 100 MG tablet Commonly known as: ZYLOPRIM Take 2 tablets (200 mg total) by mouth daily. What changed: how much to take   atorvastatin 40 MG tablet Commonly known as: LIPITOR Take 1 tablet by mouth once daily What changed: when to take this   colchicine 0.6 MG tablet Take 0.6 mg by mouth daily as needed (gout).   finasteride 5 MG tablet Commonly known as: PROSCAR TAKE 1 TABLET BY MOUTH ONCE DAILY   furosemide 20 MG tablet Commonly known as: LASIX Take 20 mg by mouth daily.   HYDROcodone-acetaminophen 5-325 MG tablet Commonly known as:  Norco Take 1-2 tablets by mouth every 6 (six) hours as needed for moderate pain or severe pain.   Hyoscyamine Sulfate SL 0.125 MG Subl Commonly known as: Levsin/SL Place 0.125 mg under the tongue every 6 (six) hours as needed (bladder spasm).   metoprolol tartrate 25 MG tablet Commonly known as: LOPRESSOR Take 1 tablet by mouth twice daily   Potassium 99 MG Tabs Take 99 mg by mouth daily.   sulfamethoxazole-trimethoprim 800-160 MG tablet Commonly known as: BACTRIM DS Take 1 tablet by mouth 2 (two) times daily. Start the day prior to foley removal appointment       Diagnostic Studies: No results found.  Disposition:   Discharge Instructions    AMB Referral to [place] Management   Complete by: As directed    Please refer to community/telephonic RN for case management services. [name] had been working with [name] prior to this hospital admission.   Thank you, Netta Cedars, MSN, RN [facility] Liaison Nurse Mobile Phone [phone]  Toll free office [phone]   Reason for consult: Post hospital follow up   Diagnoses of: Other   Other Diagnosis: CKD; post surgery - prostate and physical mobility with gout flare while hospitalized   Expected date of contact: 1-3 days (reserved for hospital discharges)      Follow-up Information    McKenzie, Candee Furbish, MD On 03/23/2019.   Specialty: Urology Why: at 1:15 Contact information: [place] [address] [place], [place] Follow up.   Specialty: [place] Why: [facility] physical therapy Contact information: [place] [address] [phone]            Signed: Nicolette Bang 03/20/2019, 11:40 AM

## 2019-03-23 ENCOUNTER — Telehealth: Payer: Self-pay

## 2019-03-23 ENCOUNTER — Other Ambulatory Visit: Payer: Self-pay | Admitting: *Deleted

## 2019-03-23 DIAGNOSIS — R338 Other retention of urine: Secondary | ICD-10-CM | POA: Diagnosis not present

## 2019-03-23 NOTE — Patient Outreach (Addendum)
[facility]) Care Management  03/23/2019  Jaimere Haslett Jul 18, 1950 [id number]   Difficult Case Discussion- Coverage for Chucky May, NP  [facility] RN CM reviewed patient in [facility] multidisciplinary meeting Discussed Chucky May, NP visit with pt. A ,ed box was provided. He is wearing his leg bag, managing, noted yellow urine, Home environment okay with food noted but storage in living room. Pt is need of a support team and this will be worked on.  Recommendation for further monitoring of this patient and for [facility] NP/SW services for assist with support system for the patient  Plan [facility] RN CM will update C Spinks on recommendations to follow up with patient   Joelene Millin L. Lavina Hamman, RN, BSN, [place] Coordinator Office number [phone] Mobile number [phone]  Main THN number [phone] Fax number [phone]

## 2019-03-23 NOTE — Telephone Encounter (Signed)
Noted  Copied from [place] [phone]. Topic: General - Other >> Mar 23, 2019  3:39 PM Virl Axe D wrote: Reason for CRM: Shanon Brow, PT with [facility] stated pt delayed their appointments by a day but he was seen. Please advise.

## 2019-03-27 ENCOUNTER — Other Ambulatory Visit: Payer: Self-pay | Admitting: *Deleted

## 2019-03-27 ENCOUNTER — Other Ambulatory Visit: Payer: Self-pay

## 2019-03-27 NOTE — Patient Outreach (Signed)
Telephone outreach.   Post op: Mr. Gilmer is doing well. He has had his f/u visit post prostatectomy and his catheter was removed. He only had one experience with incontinence and that was right after the catheter was removed.  Gout: Knee pain has subsided along with swelling. He reports his legs are weak since he hasn't been moving about like usual for several weeks.  THN CM Care Plan Problem One     Most Recent Value  Care Plan Problem One  potential for readmission due to recent hospitalization  (Pended)   Role Documenting the Problem One  Care Management Telephonic Coordinator  (Pended)   [place] for Problem One  Active  (Pended)   [place] Term Goal   patient will report no hospital readmission within 45 days  (Pended)   [place] Term Goal Start Date  02/26/19  (Pended)     [facility] CM Care Plan Problem Two     Most Recent Value  Care Plan Problem Two  Knowledge deficit related to simple prostatectomy  (Pended)   Role Documenting the Problem Two  Care Management Telephonic Coordinator  (Pended)   Care Plan for Problem Two  Not Active  (Pended)   THN CM Short Term Goal #1   Patient wil be able to verbalize contact information for provider post surgical procedure within 2 weeks   (Pended)   THN CM Short Term Goal #1 Start Date  02/26/19  (Pended)   THN CM Short Term Goal #1 Met Date   03/07/19  (Pended)   THN CM Short Term Goal #2   patient will be able to state at least 3 signs/ symptoms of infection within 2 weeks.   (Pended)   THN CM Short Term Goal #2 Start Date  03/07/19  (Pended)     [facility] CM Care Plan Problem Three     Most Recent Value  Care Plan Problem Three  High risk for falls due to painful gout in L knee.  (Pended)   Role Documenting the Problem Three  Care Management Coordinator  (Pended)   [place] for Problem Three  Active  (Pended)   THN Long Term Goal   Pt will not report fall with injury over the next 45 days, .  (Pended)   THN Long Term Goal Start Date  03/15/19   (Pended)      INTERVENTIONS: 1)WE DISCUSSED BLADDER TRAINING  TO INCREASE TIME BETWEEN VOIDS WHILE HE IS AT HOME. 2) ENCOURAGED INCREASING ACTIVITY TO INCREASE HIS LEG STRENGTH AND ENDURANCE TO PRE OP LEVELS.  Will call pt next week.  [name] Myrtie Neither, MSN, [facility] Gerontological Nurse Practitioner [facility] Care Management [phone]

## 2019-03-28 ENCOUNTER — Encounter: Payer: Self-pay | Admitting: Family Medicine

## 2019-03-28 ENCOUNTER — Ambulatory Visit (INDEPENDENT_AMBULATORY_CARE_PROVIDER_SITE_OTHER): Payer: Medicare Other | Admitting: Family Medicine

## 2019-03-28 ENCOUNTER — Other Ambulatory Visit: Payer: Self-pay

## 2019-03-28 DIAGNOSIS — M170 Bilateral primary osteoarthritis of knee: Secondary | ICD-10-CM

## 2019-03-28 NOTE — Progress Notes (Signed)
[name] [place], [place] [address] Phone: [phone] Subjective:   [name], am serving as a scribe for Dr. Hulan Saas.   CC: Bilateral knee pain  [id number]   07/07/2018 Bilateral injections given today.  Tolerated the procedure well.  Possible gout attack based on the appearance of the fluid will be sent to lab discussed with patient again about diet.  With be careful with medication secondary to patient's comorbidities for gout.  History of severe kidney disease.  Patient will be following up in 3-4 weeks to see how patient is responding.  Update 03/28/2019 [name] is a 68 y.o. male coming in with complaint of bilateral knee pain. Patient states that his pain has gone down somewhat. R>L. Pain is constant and can be sharp or dull.  Mild increasing instability.  Patient seen symptoms feels like if he walks too far he will have increasing discomfort.    Past Medical History:  Diagnosis Date  . Anemia    normal Fe, nl B12, nl retic, nl EPO July '13  . Blood transfusion without reported diagnosis   . BPH (benign prostatic hyperplasia)   . Chronic back pain   . Chronic kidney disease    CKD III, obstructive nephropathy  . Diverticulosis   . Dysuria   . Elevated PSA, greater than or equal to 20 ng/ml June '13   PSA 107  . Hemorrhoids, internal, with bleeding, prolapse 09/19/2014  . Hyperlipidemia 05/29/2014  . Hypertension   . Obstructive uropathy 11/27/2015  . Renal insufficiency   . Tuberculosis    h/o PPD +  . UTI (urinary tract infection)   . Vitamin D deficiency 09/13/2017   Past Surgical History:  Procedure Laterality Date  . CARPAL TUNNEL RELEASE Right   . COLONOSCOPY    . HEMORRHOID BANDING    . SPLENECTOMY    . XI ROBOTIC ASSISTED SIMPLE PROSTATECTOMY N/A 03/09/2019   Procedure: XI ROBOTIC ASSISTED SIMPLE PROSTATECTOMY;  Surgeon: Cleon Gustin, MD;  Location: [facility];  Service: Urology;   Laterality: N/A;   Social History   Socioeconomic History  . Marital status: Single    Spouse name: Not on file  . Number of children: 2  . Years of education: 53  . Highest education level: Some college, [name] degree  Occupational History  . 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 Financial resource strain: Somewhat hard  . Food insecurity    Worry: Never true    Inability: Never true  . Transportation needs    Medical: Yes    Non-medical: Yes  Tobacco Use  . Smoking status: Former Smoker    Types: Cigarettes    Quit date: 02/26/1980    Years since quitting: 39.1  . Smokeless tobacco: Never Used  . Tobacco comment: Quit 1981  Substance and Sexual Activity  . Alcohol use: [name]    Alcohol/week: 0.0 standard drinks    Frequency: Never    Comment: Quit 2004  . Drug use: [name]  . Sexual activity: Never  Lifestyle  . Physical activity    Days per week: 3 days    Minutes per session: 10 min  . Stress: Only a little  Relationships  . Social Herbalist on phone: Three times a week    Gets together: Once a week    Attends religious service: 1 to 4 times  per year    Active member of club or organization: Yes    Attends meetings of clubs or organizations: 1 to 4 times per year    Relationship status: Married  Other Topics Concern  . Not on file  Social History Narrative   Native of [place], raised poor farming community. . To [place] '78 - finished College, all but Arts administrator. Married - wife in [place], blocked from [place] to [place]. 1 son '90; 1 dtr '93. 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 Resources depleted.   Right handed.   Caffeine Hot daily one daily.   [name] Known Allergies Family History  Problem Relation Age of Onset  . Diabetes Mother   . Stroke Brother   . Hearing loss Brother   . Colon cancer Neg Hx   . Rectal cancer Neg Hx   . Stomach cancer Neg Hx   . Esophageal  cancer Neg Hx      Current Outpatient Medications (Cardiovascular):  .  atorvastatin (LIPITOR) 40 MG tablet, Take 1 tablet by mouth once daily (Patient taking differently: Take 40 mg by mouth at bedtime. ) .  furosemide (LASIX) 20 MG tablet, Take 20 mg by mouth daily. .  metoprolol tartrate (LOPRESSOR) 25 MG tablet, Take 1 tablet by mouth twice daily   Current Outpatient Medications (Analgesics):  .  acetaminophen (TYLENOL) 500 MG tablet, Take 1,000 mg by mouth every 6 (six) hours as needed for moderate pain.  Marland Kitchen  allopurinol (ZYLOPRIM) 100 MG tablet, Take 2 tablets (200 mg total) by mouth daily. (Patient taking differently: Take 100 mg by mouth daily. ) .  colchicine 0.6 MG tablet, Take 0.6 mg by mouth daily as needed (gout).  Marland Kitchen  HYDROcodone-acetaminophen (NORCO) 5-325 MG tablet, Take 1-2 tablets by mouth every 6 (six) hours as needed for moderate pain or severe pain.   Current Outpatient Medications (Other):  .  finasteride (PROSCAR) 5 MG tablet, TAKE 1 TABLET BY MOUTH ONCE DAILY (Patient taking differently: Take 5 mg by mouth daily. ) .  Hyoscyamine Sulfate SL (LEVSIN/SL) 0.125 MG SUBL, Place 0.125 mg under the tongue every 6 (six) hours as needed (bladder spasm). .  Potassium 99 MG TABS, Take 99 mg by mouth daily.  Marland Kitchen  sulfamethoxazole-trimethoprim (BACTRIM DS) 800-160 MG tablet, Take 1 tablet by mouth 2 (two) times daily. Start the day prior to foley removal appointment    Past medical history, social, surgical and family history all reviewed in electronic medical record.  [name] pertanent information unless stated regarding to the chief complaint.   Review of Systems:  [name] headache, visual changes, nausea, vomiting, diarrhea, constipation, dizziness, abdominal pain, skin rash, fevers, chills, night sweats, weight loss, swollen lymph nodes, body aches, joint swelling, chest pain, shortness of breath, mood changes.  Positive muscle aches  Objective  Blood pressure 118/60, pulse 81, height  6\' 1"  (1.854 m), weight 248 lb (112.5 kg), SpO2 98 %.    General: [name] apparent distress alert and oriented x3 mood and affect normal, dressed appropriately.  HEENT: Pupils equal, extraocular movements intact  Respiratory: Patient's speak in full sentences and does not appear short of breath  Cardiovascular: [name] lower extremity edema, non tender, [name] erythema  Skin: Warm dry intact with [name] signs of infection or rash on extremities or on axial skeleton.  Abdomen: Soft nontender  Neuro: Cranial nerves II through XII are intact, neurovascularly intact in all extremities with 2+ DTRs and 2+ pulses.  Lymph: [name] lymphadenopathy  of posterior or anterior cervical chain or axillae bilaterally.  Gait antalgic using a cane MSK:  tender with limited range of motion and good stability and symmetric strength and tone of shoulders, elbows, wrist, hip, knee and ankles bilaterally.  Arthritic changes of multiple joints   Knee: Bilateral valgus deformity noted. Large thigh to calf ratio.  Trace effusion noted Tender to palpation over medial and PF joint line.  ROM full in flexion and extension and lower leg rotation. instability with valgus force.  painful patellar compression. Patellar glide with moderate crepitus. Patellar and quadriceps tendons unremarkable. Hamstring and quadriceps strength is normal.   After informed written and verbal consent, patient was seated on exam table. Right knee was prepped with alcohol swab and utilizing anterolateral approach, patient's right knee space was injected with 4:1  marcaine 0.5%: Kenalog 40mg /dL. Patient tolerated the procedure well without immediate complications.  After informed written and verbal consent, patient was seated on exam table. Left knee was prepped with alcohol swab and utilizing anterolateral approach, patient's left knee space was injected with 4:1  marcaine 0.5%: Kenalog 40mg /dL. Patient tolerated the procedure well without immediate complications.    Impression and Recommendations:     This case required medical decision making of moderate complexity. The above documentation has been reviewed and is accurate and complete Lyndal Pulley, DO       Note: This dictation was prepared with Dragon dictation along with smaller phrase technology. Any transcriptional errors that result from this process are unintentional.

## 2019-03-28 NOTE — Patient Instructions (Signed)
Voltaren Gel- 2x daily as needed See me again in 6-8 weeks and can do gel injection if worse

## 2019-03-28 NOTE — Assessment & Plan Note (Signed)
Repeat injection in the knees given today.  Discussed icing regimen and home exercise, we discussed which appears to do which wants to avoid.  Patient will increase activity as tolerated.  Hoping the patient has just as much good success.  Do not want to do significant number of medication secondary to other comorbidities.  Could be a candidate for viscosupplementation if necessary.  Follow-up again in 6 to 8 weeks

## 2019-03-29 DIAGNOSIS — R338 Other retention of urine: Secondary | ICD-10-CM | POA: Diagnosis not present

## 2019-03-31 ENCOUNTER — Encounter (HOSPITAL_COMMUNITY): Payer: Self-pay

## 2019-03-31 ENCOUNTER — Other Ambulatory Visit: Payer: Self-pay

## 2019-03-31 ENCOUNTER — Emergency Department (HOSPITAL_COMMUNITY)
Admission: EM | Admit: 2019-03-31 | Discharge: 2019-04-01 | Disposition: A | Discharge status: Home / Self Care | Payer: Medicare Other | Attending: Emergency Medicine | Admitting: Emergency Medicine

## 2019-03-31 DIAGNOSIS — R55 Syncope and collapse: Secondary | ICD-10-CM | POA: Insufficient documentation

## 2019-03-31 DIAGNOSIS — I129 Hypertensive chronic kidney disease with stage 1 through stage 4 chronic kidney disease, or unspecified chronic kidney disease: Secondary | ICD-10-CM | POA: Diagnosis not present

## 2019-03-31 DIAGNOSIS — R42 Dizziness and giddiness: Secondary | ICD-10-CM | POA: Diagnosis not present

## 2019-03-31 DIAGNOSIS — N183 Chronic kidney disease, stage 3 unspecified: Secondary | ICD-10-CM | POA: Insufficient documentation

## 2019-03-31 DIAGNOSIS — R519 Headache, unspecified: Secondary | ICD-10-CM | POA: Diagnosis not present

## 2019-03-31 DIAGNOSIS — R109 Unspecified abdominal pain: Secondary | ICD-10-CM | POA: Insufficient documentation

## 2019-03-31 DIAGNOSIS — N3000 Acute cystitis without hematuria: Secondary | ICD-10-CM | POA: Insufficient documentation

## 2019-03-31 DIAGNOSIS — Z87891 Personal history of nicotine dependence: Secondary | ICD-10-CM | POA: Insufficient documentation

## 2019-03-31 DIAGNOSIS — Z79899 Other long term (current) drug therapy: Secondary | ICD-10-CM | POA: Insufficient documentation

## 2019-03-31 LAB — CBC
HCT: 28.2 % — ABNORMAL LOW (ref 39.0–52.0)
Hemoglobin: 8.4 g/dL — ABNORMAL LOW (ref 13.0–17.0)
MCH: 26.4 pg (ref 26.0–34.0)
MCHC: 29.8 g/dL — ABNORMAL LOW (ref 30.0–36.0)
MCV: 88.7 fL (ref 80.0–100.0)
Platelets: 539 10*3/uL — ABNORMAL HIGH (ref 150–400)
RBC: 3.18 MIL/uL — ABNORMAL LOW (ref 4.22–5.81)
RDW: 15.1 % (ref 11.5–15.5)
WBC: 9.6 10*3/uL (ref 4.0–10.5)
nRBC: 0 % (ref 0.0–0.2)

## 2019-03-31 LAB — BASIC METABOLIC PANEL
Anion gap: 9 (ref 5–15)
BUN: 47 mg/dL — ABNORMAL HIGH (ref 8–23)
CO2: 25 mmol/L (ref 22–32)
Calcium: 8.4 mg/dL — ABNORMAL LOW (ref 8.9–10.3)
Chloride: 103 mmol/L (ref 98–111)
Creatinine, Ser: 3.42 mg/dL — ABNORMAL HIGH (ref 0.61–1.24)
GFR calc Af Amer: 20 mL/min — ABNORMAL LOW (ref >60)
GFR calc non Af Amer: 17 mL/min — ABNORMAL LOW (ref >60)
Glucose, Bld: 124 mg/dL — ABNORMAL HIGH (ref 70–99)
Potassium: 4 mmol/L (ref 3.5–5.1)
Sodium: 137 mmol/L (ref 135–145)

## 2019-03-31 LAB — CBG MONITORING, ED: Glucose-Capillary: 102 mg/dL — ABNORMAL HIGH (ref 70–99)

## 2019-03-31 MED ORDER — SODIUM CHLORIDE 0.9% FLUSH
3.0000 mL | Freq: Once | INTRAVENOUS | Status: AC
  Administered 2019-03-31: 23:00:00 3 mL via INTRAVENOUS

## 2019-03-31 MED ORDER — SODIUM CHLORIDE 0.9 % IV BOLUS
500.0000 mL | Freq: Once | INTRAVENOUS | Status: AC
  Administered 2019-03-31: 500 mL via INTRAVENOUS

## 2019-03-31 NOTE — ED Notes (Addendum)
Pt able to stand at bedside to use urinal independently.

## 2019-03-31 NOTE — ED Provider Notes (Signed)
[facility] DEPT Provider Note   CSN: [id number] Arrival date & time: 03/31/19  2008     History   Chief Complaint Chief Complaint  Patient presents with  . Near Syncope    HPI [name] is a 68 y.o. male.     Patient with history of HTN, BPH, anemia, HLD, renal insufficiency to ED with positional lightheadedness that started today. He went to stand up and felt like he was going to fall. He denies any room-spinning sensation. No chest pain, SOB, recent fever, cough, congestion. No nausea or vomiting. He reports a recent history of prostate surgery and gout in his knees and that he is recovering well from both. He was started on an antibiotic for an "infection in my testicle" but has finished this medication and feels these symptoms are improving. No headache, lateralized weakness, numbness or tingling. He reports some ongoing abdominal pain from his prostate surgery but that his is improving also.   The history is provided by the patient. No language interpreter was used.  Near Syncope Associated symptoms include abdominal pain (See HPI.). Pertinent negatives include no headaches.    Past Medical History:  Diagnosis Date  . Anemia    normal Fe, nl B12, nl retic, nl EPO July '13  . Blood transfusion without reported diagnosis   . BPH (benign prostatic hyperplasia)   . Chronic back pain   . Chronic kidney disease    CKD III, obstructive nephropathy  . Diverticulosis   . Dysuria   . Elevated PSA, greater than or equal to 20 ng/ml June '13   PSA 107  . Hemorrhoids, internal, with bleeding, prolapse 09/19/2014  . Hyperlipidemia 05/29/2014  . Hypertension   . Obstructive uropathy 11/27/2015  . Renal insufficiency   . Tuberculosis    h/o PPD +  . UTI (urinary tract infection)   . Vitamin D deficiency 09/13/2017    Patient Active Problem List   Diagnosis Date Noted  . BPH with obstruction/lower urinary tract symptoms 03/09/2019  . Clot  retention of urine 02/11/2019  . Vitamin D deficiency 09/13/2017  . Urinary tract infection without hematuria 07/23/2017  . Low back pain 07/03/2017  . Dizziness 06/30/2017  . Acute gouty arthritis 12/01/2016  . Degenerative arthritis of knee, bilateral 11/18/2016  . Urinary retention due to benign prostatic hyperplasia 10/24/2016  . Mass of right side of neck 10/20/2016  . Gout 10/20/2016  . Knee pain, acute 10/12/2016  . Hypokalemia 05/28/2016  . Encounter for well adult exam with abnormal findings 11/27/2015  . Obstructive uropathy 11/27/2015  . Elevated PSA 11/27/2015  . Acute pyelonephritis 05/27/2015  . Generalized bloating 05/27/2015  . Constipation 05/27/2015  . Chest pain 05/27/2015  . AKI (acute kidney injury) ([facility]) 05/27/2015  . Acute sinus infection 11/22/2014  . Cough 09/25/2014  . Hemorrhoids, internal, with bleeding, prolapse 09/19/2014  . 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 Hyperlipidemia 05/29/2014  . Lumbar radiculopathy 05/23/2014  . Lumbar stenosis 11/20/2013  . Abnormal glucose 11/06/2013  . Unspecified hereditary and idiopathic peripheral neuropathy 01/22/2013  . 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 CKD (chronic kidney disease) stage 3, GFR 30-59 ml/min ([facility]) 11/24/2011  . Hematuria 11/24/2011  . Hydronephrosis 11/24/2011  . Anemia 11/24/2011  . BRADYCARDIA 02/05/2010  . TINEA PEDIS 01/29/2010  . 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 PERIPHERAL EDEMA 01/29/2010  . ABNORMAL ELECTROCARDIOGRAM 01/29/2010  . 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  . Osteoarthrosis, generalized, 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  . ONYCHOMYCOSIS,  TOENAILS 10/02/2007  . BPH (benign prostatic hyperplasia) 10/02/2007  . Essential hypertension 03/06/2007    Past Surgical History:  Procedure Laterality Date  . CARPAL TUNNEL RELEASE Right   . COLONOSCOPY    . HEMORRHOID BANDING    . SPLENECTOMY    . XI ROBOTIC ASSISTED SIMPLE PROSTATECTOMY N/A 03/09/2019   Procedure: XI  ROBOTIC ASSISTED SIMPLE PROSTATECTOMY;  Surgeon: Cleon Gustin, MD;  Location: [facility];  Service: Urology;  Laterality: N/A;        Home Medications    Prior to Admission medications   Medication Sig Start Date End Date Taking? Authorizing Provider  acetaminophen (TYLENOL) 500 MG tablet Take 1,000 mg by mouth every 6 (six) hours as needed for moderate pain.     [provider]  allopurinol (ZYLOPRIM) 100 MG tablet Take 2 tablets (200 mg total) by mouth daily. Patient taking differently: Take 100 mg by mouth daily.  05/12/18   Biagio Borg, MD  atorvastatin (LIPITOR) 40 MG tablet Take 1 tablet by mouth once daily Patient taking differently: Take 40 mg by mouth at bedtime.  02/13/19   Biagio Borg, MD  colchicine 0.6 MG tablet Take 0.6 mg by mouth daily as needed (gout).  10/21/18   [provider]  finasteride (PROSCAR) 5 MG tablet TAKE 1 TABLET BY MOUTH ONCE DAILY Patient taking differently: Take 5 mg by mouth daily.  06/12/18   Biagio Borg, MD  furosemide (LASIX) 20 MG tablet Take 20 mg by mouth daily.    [provider]  HYDROcodone-acetaminophen (NORCO) 5-325 MG tablet Take 1-2 tablets by mouth every 6 (six) hours as needed for moderate pain or severe pain. 03/09/19   Debbrah Alar, PA-C  Hyoscyamine Sulfate SL (LEVSIN/SL) 0.125 MG SUBL Place 0.125 mg under the tongue every 6 (six) hours as needed (bladder spasm). 02/11/19   Irine Seal, MD  metoprolol tartrate (LOPRESSOR) 25 MG tablet Take 1 tablet by mouth twice daily 03/08/19   Biagio Borg, MD  Potassium 99 MG TABS Take 99 mg by mouth daily.     [provider]  sulfamethoxazole-trimethoprim (BACTRIM DS) 800-160 MG tablet Take 1 tablet by mouth 2 (two) times daily. Start the day prior to foley removal appointment 03/09/19   Debbrah Alar, PA-C    Family History Family History  Problem Relation Age of Onset  . Diabetes Mother   . Stroke Brother   . Hearing loss Brother   . Colon cancer Neg Hx    . Rectal cancer Neg Hx   . Stomach cancer Neg Hx   . Esophageal cancer Neg Hx     Social History Social History   Tobacco Use  . Smoking status: Former Smoker    Types: Cigarettes    Quit date: 02/26/1980    Years since quitting: 39.1  . Smokeless tobacco: Never Used  . Tobacco comment: Quit 1981  Substance Use Topics  . Alcohol use: No    Alcohol/week: 0.0 standard drinks    Frequency: Never    Comment: Quit 2004  . Drug use: No     Allergies   Patient has no known allergies.   Review of Systems Review of Systems  Constitutional: Negative for chills and fever.  HENT: Negative.   Eyes: Negative for visual disturbance.  Respiratory: Negative.   Cardiovascular: Positive for near-syncope.  Gastrointestinal: Positive for abdominal pain (See HPI.). Negative for nausea and vomiting.  Genitourinary: Negative for difficulty urinating.  Musculoskeletal: Negative.  Skin: Negative.   Neurological: Positive for light-headedness. Negative for dizziness, syncope, weakness, numbness and headaches.     Physical Exam Updated Vital Signs BP (!) 150/96   Pulse 65   Temp 98.7 F (37.1 C) (Oral)   Resp 17   SpO2 100%   Physical Exam Vitals signs and nursing note reviewed.  Constitutional:      General: He is not in acute distress.    Appearance: Normal appearance. He is well-developed. He is not ill-appearing.  HENT:     Head: Normocephalic.     Nose: Nose normal.     Mouth/Throat:     Mouth: Mucous membranes are moist.  Eyes:     Conjunctiva/sclera: Conjunctivae normal.     Pupils: Pupils are equal, round, and reactive to light.  Neck:     Musculoskeletal: Normal range of motion and neck supple.     Vascular: No carotid bruit.  Cardiovascular:     Rate and Rhythm: Normal rate and regular rhythm.     Heart sounds: No murmur.  Pulmonary:     Effort: Pulmonary effort is normal.     Breath sounds: Normal breath sounds. No wheezing, rhonchi or rales.  Abdominal:      General: Bowel sounds are normal.     Palpations: Abdomen is soft.     Tenderness: There is abdominal tenderness (Mild diffuse tenderness to soft abdomen). There is no guarding or rebound.  Musculoskeletal: Normal range of motion.     Right lower leg: No edema.     Left lower leg: No edema.  Skin:    General: Skin is warm and dry.     Findings: No rash.  Neurological:     Mental Status: He is alert and oriented to person, place, and time.      ED Treatments / Results  Labs (all labs ordered are listed, but only abnormal results are displayed) Labs Reviewed  BASIC METABOLIC PANEL - Abnormal; Notable for the following components:      Result Value   Glucose, Bld 124 (*)    BUN 47 (*)    Creatinine, Ser 3.42 (*)    Calcium 8.4 (*)    GFR calc non Af Amer 17 (*)    GFR calc Af Amer 20 (*)    All other components within normal limits  CBC - Abnormal; Notable for the following components:   RBC 3.18 (*)    Hemoglobin 8.4 (*)    HCT 28.2 (*)    MCHC 29.8 (*)    Platelets 539 (*)    All other components within normal limits  CBG MONITORING, ED - Abnormal; Notable for the following components:   Glucose-Capillary 102 (*)    All other components within normal limits  URINALYSIS, ROUTINE W REFLEX MICROSCOPIC   Results for orders placed or performed during the hospital encounter of [id number]  Basic metabolic panel  Result Value Ref Range   Sodium 137 135 - 145 mmol/L   Potassium 4.0 3.5 - 5.1 mmol/L   Chloride 103 98 - 111 mmol/L   CO2 25 22 - 32 mmol/L   Glucose, Bld 124 (H) 70 - 99 mg/dL   BUN 47 (H) 8 - 23 mg/dL   Creatinine, Ser 3.42 (H) 0.61 - 1.24 mg/dL   Calcium 8.4 (L) 8.9 - 10.3 mg/dL   GFR calc non Af Amer 17 (L) >60 mL/min   GFR calc Af Amer 20 (L) >60 mL/min   Anion gap 9 5 -  15  CBC  Result Value Ref Range   WBC 9.6 4.0 - 10.5 K/uL   RBC 3.18 (L) 4.22 - 5.81 MIL/uL   Hemoglobin 8.4 (L) 13.0 - 17.0 g/dL   HCT 28.2 (L) 39.0 - 52.0 %   MCV 88.7 80.0 - 100.0  fL   MCH 26.4 26.0 - 34.0 pg   MCHC 29.8 (L) 30.0 - 36.0 g/dL   RDW 15.1 11.5 - 15.5 %   Platelets 539 (H) 150 - 400 K/uL   nRBC 0.0 0.0 - 0.2 %  CBG monitoring, ED  Result Value Ref Range   Glucose-Capillary 102 (H) 70 - 99 mg/dL     EKG EKG Interpretation  Date/Time:  Saturday March 31 2019 20:32:19 EDT Ventricular Rate:  77 PR Interval:    QRS Duration: 155 QT Interval:  408 QTC Calculation: 462 R Axis:   -41 Text Interpretation:  Sinus rhythm Atrial premature complex RBBB and LAFB LVH by voltage tachycardia is no longer present compared to Mar 12 2019 Confirmed by Sherwood Gambler [phone]) on 03/31/2019 9:57:11 PM   Radiology No results found.  Procedures Procedures (including critical care time)  Medications Ordered in ED Medications  sodium chloride flush (NS) 0.9 % injection 3 mL (has no administration in time range)  sodium chloride 0.9 % bolus 500 mL (has no administration in time range)     Initial Impression / Assessment and Plan / ED Course  I have reviewed the triage vital signs and the nursing notes.  Pertinent labs & imaging results that were available during my care of the patient were reviewed by me and considered in my medical decision making (see chart for details).        Patient to ED with lightheadedness when going to a standing position that started today. No dizziness, HA, CP, SOB, nausea. Better when lying down. No fever.  Labs show a mild AKI with baseline Cr around 2.5 - today 3.42. Hgb is baseline at 8.4. Orthostatics ordered. Will give 500 cc fluids and reassess.   12:50 - patient has received fluids. On standing, he does not feel improved. Additional 500 cc bolus ordered.   3:00 - patient ambulated after 2nd bolus and feels the same without improvement. Feels and looks "wobbly" when standing, however, able to stand independently to urinate. Head CT ordered.   4:45 - Head CT negative acute. Patient is found to have a UTI which is  likely contributing to symptoms. Doubt stroke with negative head CT and no specific focal deficits. Doubt sepsis without fever. No change in mental status.   5:30 - IV Rocephin is provided. He is comfortable with discharge home. He lives with a roommate but functions independently. Has established follow up outpatient. Return precautions discussed.   Final Clinical Impressions(s) / ED Diagnoses   Final diagnoses:  None   1. UTI 2. [place]  ED Discharge Orders    None       Charlann Lange, PA-C 04/01/19 0533    Sherwood Gambler, MD 04/02/19 1537

## 2019-03-31 NOTE — ED Triage Notes (Signed)
Pt reports dizziness and near syncope that started earlier today. He reports feeling like he was going to pass out. He called a his doctors office that recommended that he be evaluated.

## 2019-04-01 ENCOUNTER — Emergency Department (HOSPITAL_COMMUNITY): Payer: Medicare Other

## 2019-04-01 DIAGNOSIS — R42 Dizziness and giddiness: Secondary | ICD-10-CM | POA: Diagnosis not present

## 2019-04-01 LAB — URINALYSIS, ROUTINE W REFLEX MICROSCOPIC
Bacteria, UA: NONE SEEN
Bilirubin Urine: NEGATIVE
Glucose, UA: NEGATIVE mg/dL
Ketones, ur: NEGATIVE mg/dL
Nitrite: NEGATIVE
Protein, ur: 30 mg/dL — AB
Specific Gravity, Urine: 1.014 (ref 1.005–1.030)
WBC, UA: >50 WBC/hpf — ABNORMAL HIGH (ref 0–5)
pH: 5 (ref 5.0–8.0)

## 2019-04-01 MED ORDER — SODIUM CHLORIDE 0.9 % IV BOLUS
500.0000 mL | Freq: Once | INTRAVENOUS | Status: AC
  Administered 2019-04-01: 500 mL via INTRAVENOUS

## 2019-04-01 MED ORDER — DIPHENHYDRAMINE HCL 50 MG/ML IJ SOLN
12.5000 mg | Freq: Once | INTRAMUSCULAR | Status: AC
  Administered 2019-04-01: 12.5 mg via INTRAVENOUS
  Filled 2019-04-01: qty 1

## 2019-04-01 MED ORDER — SODIUM CHLORIDE 0.9 % IV SOLN
1.0000 g | Freq: Once | INTRAVENOUS | Status: AC
  Administered 2019-04-01: 1 g via INTRAVENOUS
  Filled 2019-04-01: qty 10

## 2019-04-01 MED ORDER — CEPHALEXIN 250 MG PO CAPS: 250.0000 mg | ORAL_CAPSULE | Freq: Three times a day (TID) | ORAL | 0 refills | Status: DC

## 2019-04-01 MED ORDER — METOCLOPRAMIDE HCL 5 MG/ML IJ SOLN
10.0000 mg | Freq: Once | INTRAMUSCULAR | Status: AC
  Administered 2019-04-01: 10 mg via INTRAVENOUS
  Filled 2019-04-01: qty 2

## 2019-04-01 NOTE — ED Notes (Signed)
Pt transported to CT ?

## 2019-04-01 NOTE — ED Notes (Signed)
Pt c/o headache and light sensitivity.

## 2019-04-01 NOTE — Discharge Instructions (Addendum)
Follow up with your urologist for recheck of urinary tract infection and follow up on culture results.   Return to the emergency department if your symptoms of lightheadedness persist or worsen, if you develop high fever, severe pain, feel you are about to pass out or if you start having fainting episodes.

## 2019-04-02 ENCOUNTER — Other Ambulatory Visit: Payer: Self-pay

## 2019-04-02 ENCOUNTER — Other Ambulatory Visit: Payer: Self-pay | Admitting: *Deleted

## 2019-04-02 LAB — URINE CULTURE: Culture: NO GROWTH

## 2019-04-02 NOTE — Patient Outreach (Signed)
Telephone outreach.   [name], had an ER visit on Saturday. He went because he had lightheadedness. He was found to have a UTI and was treated. He was encouraged to drink adequate fluids.  Today he is feeling better, however his gout is flaring up. He walked about a mile last night as he has been very sedentary since his surgery. He takes allopurinol 100 mg bid and today he did also take a colchicine.  [facility] CM Care Plan Problem One     Most Recent Value  Care Plan Problem One  potential for readmission due to recent hospitalization  (Pended)   Role Documenting the Problem One  Care Management Telephonic Coordinator  (Pended)   [place] for Problem One  Active  (Pended)   [place] Term Goal   patient will report no hospital readmission within 45 days  (Pended)   [place] Term Goal Start Date  02/26/19  (Pended)     [facility] CM Care Plan Problem Two     Most Recent Value  Care Plan Problem Two  Knowledge deficit related to simple prostatectomy  (Pended)   Role Documenting the Problem Two  Care Management Telephonic Coordinator  (Pended)   Care Plan for Problem Two  Not Active  (Pended)   [facility] CM Short Term Goal #1   Patient wil be able to verbalize contact information for provider post surgical procedure within 2 weeks   (Pended)   [facility] CM Short Term Goal #1 Start Date  02/26/19  (Pended)   [facility] CM Short Term Goal #1 Met Date   03/07/19  (Pended)   [facility] CM Short Term Goal #2   patient will be able to state at least 3 signs/ symptoms of infection within 2 weeks.   (Pended)   [facility] CM Short Term Goal #2 Start Date  03/07/19  (Pended)   [facility] CM Short Term Goal #2 Met Date  03/27/19  (Pended)     [facility] CM Care Plan Problem Three     Most Recent Value  Care Plan Problem Three  High risk for falls due to painful gout in L knee.  (Pended)   Role Documenting the Problem Three  Care Management Coordinator  (Pended)   Care Plan for Problem Three  Active  (Pended)   [facility] Long Term Goal   Pt will not report  fall with injury over the next 45 days, .  (Pended)   [facility] Long Term Goal Start Date  03/15/19  (Pended)      I will call him again in one week. Will also send Emmi on Low [place] diet.  [name] Myrtie Neither, MSN, [facility] Gerontological Nurse Practitioner [facility] Care Management [phone]

## 2019-04-04 ENCOUNTER — Emergency Department (HOSPITAL_BASED_OUTPATIENT_CLINIC_OR_DEPARTMENT_OTHER): Payer: Medicare Other

## 2019-04-04 ENCOUNTER — Encounter (HOSPITAL_COMMUNITY): Payer: Self-pay

## 2019-04-04 ENCOUNTER — Emergency Department (HOSPITAL_COMMUNITY)
Admission: EM | Admit: 2019-04-04 | Discharge: 2019-04-04 | Disposition: A | Discharge status: Home / Self Care | Payer: Medicare Other | Attending: Emergency Medicine | Admitting: Emergency Medicine

## 2019-04-04 ENCOUNTER — Other Ambulatory Visit: Payer: Self-pay

## 2019-04-04 DIAGNOSIS — M79605 Pain in left leg: Secondary | ICD-10-CM | POA: Diagnosis not present

## 2019-04-04 DIAGNOSIS — Z87891 Personal history of nicotine dependence: Secondary | ICD-10-CM | POA: Diagnosis not present

## 2019-04-04 DIAGNOSIS — N183 Chronic kidney disease, stage 3 unspecified: Secondary | ICD-10-CM | POA: Insufficient documentation

## 2019-04-04 DIAGNOSIS — M79604 Pain in right leg: Secondary | ICD-10-CM | POA: Diagnosis not present

## 2019-04-04 DIAGNOSIS — R609 Edema, unspecified: Secondary | ICD-10-CM | POA: Diagnosis not present

## 2019-04-04 DIAGNOSIS — I129 Hypertensive chronic kidney disease with stage 1 through stage 4 chronic kidney disease, or unspecified chronic kidney disease: Secondary | ICD-10-CM | POA: Insufficient documentation

## 2019-04-04 DIAGNOSIS — M7989 Other specified soft tissue disorders: Secondary | ICD-10-CM

## 2019-04-04 DIAGNOSIS — Z79899 Other long term (current) drug therapy: Secondary | ICD-10-CM | POA: Diagnosis not present

## 2019-04-04 LAB — CBC WITH DIFFERENTIAL/PLATELET
Abs Immature Granulocytes: 0.02 10*3/uL (ref 0.00–0.07)
Basophils Absolute: 0.1 10*3/uL (ref 0.0–0.1)
Basophils Relative: 1 %
Eosinophils Absolute: 0.3 10*3/uL (ref 0.0–0.5)
Eosinophils Relative: 4 %
HCT: 25.2 % — ABNORMAL LOW (ref 39.0–52.0)
Hemoglobin: 7.3 g/dL — ABNORMAL LOW (ref 13.0–17.0)
Immature Granulocytes: 0 %
Lymphocytes Relative: 39 %
Lymphs Abs: 2.5 10*3/uL (ref 0.7–4.0)
MCH: 25.4 pg — ABNORMAL LOW (ref 26.0–34.0)
MCHC: 29 g/dL — ABNORMAL LOW (ref 30.0–36.0)
MCV: 87.8 fL (ref 80.0–100.0)
Monocytes Absolute: 0.6 10*3/uL (ref 0.1–1.0)
Monocytes Relative: 9 %
Neutro Abs: 3 10*3/uL (ref 1.7–7.7)
Neutrophils Relative %: 47 %
Platelets: 370 10*3/uL (ref 150–400)
RBC: 2.87 MIL/uL — ABNORMAL LOW (ref 4.22–5.81)
RDW: 15.2 % (ref 11.5–15.5)
WBC: 6.4 10*3/uL (ref 4.0–10.5)
nRBC: 0 % (ref 0.0–0.2)

## 2019-04-04 LAB — BASIC METABOLIC PANEL
Anion gap: 6 (ref 5–15)
BUN: 31 mg/dL — ABNORMAL HIGH (ref 8–23)
CO2: 27 mmol/L (ref 22–32)
Calcium: 8.3 mg/dL — ABNORMAL LOW (ref 8.9–10.3)
Chloride: 105 mmol/L (ref 98–111)
Creatinine, Ser: 2.47 mg/dL — ABNORMAL HIGH (ref 0.61–1.24)
GFR calc Af Amer: 30 mL/min — ABNORMAL LOW (ref >60)
GFR calc non Af Amer: 26 mL/min — ABNORMAL LOW (ref >60)
Glucose, Bld: 106 mg/dL — ABNORMAL HIGH (ref 70–99)
Potassium: 3.9 mmol/L (ref 3.5–5.1)
Sodium: 138 mmol/L (ref 135–145)

## 2019-04-04 NOTE — ED Notes (Signed)
US at bedside

## 2019-04-04 NOTE — Discharge Instructions (Addendum)
Take a multivitamin with iron supplement.  Iron can cause constipation, you can take a stool softener such as Colace as needed.  Iron can also make your stools look black. Follow-up with your doctor for recheck.

## 2019-04-04 NOTE — ED Triage Notes (Signed)
Pt states he is having right calf pain. Pt states insurance sent him here as they were concerned for DVT.

## 2019-04-04 NOTE — Progress Notes (Signed)
Lower extremity venous has been completed.   Preliminary results in CV Proc.   Abram Sander 04/04/2019 12:52 PM

## 2019-04-04 NOTE — ED Provider Notes (Signed)
[facility] DEPT Provider Note   CSN: [id number] Arrival date & time: 04/04/19  1148     History   Chief Complaint Chief Complaint  Patient presents with  . Leg Pain    right    HPI [name] is a 68 y.o. male.     68 year old male presents with complaint of pain in both of his lower legs for the past 3 days.  Patient reports a dull ache while resting, pain is worse if he gets up and walks.  Patient called his insurance company and was told he should go to the emergency room to get checked for a blood clot in his legs since he had surgery 3 weeks ago.  Patient reports robotic prostatectomy 3 weeks ago, no complications.  No history of prior DVT or PE, was taking aspirin daily prior to his surgery however has not resumed use.  Also reports feeling short of breath after climbing a flight of stairs which he says is new since his surgery.  Denies chest pain.  No other complaints or concerns.     Past Medical History:  Diagnosis Date  . Anemia    normal Fe, nl B12, nl retic, nl EPO July '13  . Blood transfusion without reported diagnosis   . BPH (benign prostatic hyperplasia)   . Chronic back pain   . Chronic kidney disease    CKD III, obstructive nephropathy  . Diverticulosis   . Dysuria   . Elevated PSA, greater than or equal to 20 ng/ml June '13   PSA 107  . Hemorrhoids, internal, with bleeding, prolapse 09/19/2014  . Hyperlipidemia 05/29/2014  . Hypertension   . Obstructive uropathy 11/27/2015  . Renal insufficiency   . Tuberculosis    h/o PPD +  . UTI (urinary tract infection)   . Vitamin D deficiency 09/13/2017    Patient Active Problem List   Diagnosis Date Noted  . BPH with obstruction/lower urinary tract symptoms 03/09/2019  . Clot retention of urine 02/11/2019  . Vitamin D deficiency 09/13/2017  . Urinary tract infection without hematuria 07/23/2017  . Low back pain 07/03/2017  . Dizziness 06/30/2017  . Acute gouty arthritis  12/01/2016  . Degenerative arthritis of knee, bilateral 11/18/2016  . Urinary retention due to benign prostatic hyperplasia 10/24/2016  . Mass of right side of neck 10/20/2016  . Gout 10/20/2016  . Knee pain, acute 10/12/2016  . Hypokalemia 05/28/2016  . Encounter for well adult exam with abnormal findings 11/27/2015  . Obstructive uropathy 11/27/2015  . Elevated PSA 11/27/2015  . Acute pyelonephritis 05/27/2015  . Generalized bloating 05/27/2015  . Constipation 05/27/2015  . Chest pain 05/27/2015  . AKI (acute kidney injury) ([place]) 05/27/2015  . Acute sinus infection 11/22/2014  . Cough 09/25/2014  . Hemorrhoids, internal, with bleeding, prolapse 09/19/2014  . 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 Hyperlipidemia 05/29/2014  . Lumbar radiculopathy 05/23/2014  . Lumbar stenosis 11/20/2013  . Abnormal glucose 11/06/2013  . Unspecified hereditary and idiopathic peripheral neuropathy 01/22/2013  . Cardiomyopathy due to hypertension ([place]) 12/28/2011  . CKD (chronic kidney disease) stage 3, GFR 30-59 ml/min ([facility]) 11/24/2011  . Hematuria 11/24/2011  . Hydronephrosis 11/24/2011  . Anemia 11/24/2011  . BRADYCARDIA 02/05/2010  . TINEA PEDIS 01/29/2010  . 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 PERIPHERAL EDEMA 01/29/2010  . ABNORMAL ELECTROCARDIOGRAM 01/29/2010  . 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  . Osteoarthrosis, generalized, 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  . ONYCHOMYCOSIS, TOENAILS 10/02/2007  . BPH (benign prostatic hyperplasia) 10/02/2007  .  Essential hypertension 03/06/2007    Past Surgical History:  Procedure Laterality Date  . CARPAL TUNNEL RELEASE Right   . COLONOSCOPY    . HEMORRHOID BANDING    . SPLENECTOMY    . XI ROBOTIC ASSISTED SIMPLE PROSTATECTOMY N/A 03/09/2019   Procedure: XI ROBOTIC ASSISTED SIMPLE PROSTATECTOMY;  Surgeon: Cleon Gustin, MD;  Location: [facility];  Service: Urology;  Laterality: N/A;        Home Medications    Prior to Admission medications    Medication Sig Start Date End Date Taking? Authorizing Provider  acetaminophen (TYLENOL) 500 MG tablet Take 1,000 mg by mouth every 6 (six) hours as needed for moderate pain.    Yes [provider]  allopurinol (ZYLOPRIM) 100 MG tablet Take 2 tablets (200 mg total) by mouth daily. 05/12/18  Yes Biagio Borg, MD  atorvastatin (LIPITOR) 40 MG tablet Take 1 tablet by mouth once daily Patient taking differently: Take 40 mg by mouth at bedtime.  02/13/19  Yes Biagio Borg, MD  cephALEXin (KEFLEX) 250 MG capsule Take 1 capsule (250 mg total) by mouth 3 (three) times daily. 04/01/19  Yes Charlann Lange, PA-C  colchicine 0.6 MG tablet Take 0.6 mg by mouth daily as needed (gout).  10/21/18  Yes [provider]  finasteride (PROSCAR) 5 MG tablet TAKE 1 TABLET BY MOUTH ONCE DAILY Patient taking differently: Take 5 mg by mouth daily.  06/12/18  Yes Biagio Borg, MD  furosemide (LASIX) 20 MG tablet Take 20 mg by mouth daily.   Yes [provider]  HYDROcodone-acetaminophen (NORCO) 5-325 MG tablet Take 1-2 tablets by mouth every 6 (six) hours as needed for moderate pain or severe pain. 03/09/19  Yes Dancy, Estill Bamberg, PA-C  Hyoscyamine Sulfate SL (LEVSIN/SL) 0.125 MG SUBL Place 0.125 mg under the tongue every 6 (six) hours as needed (bladder spasm). 02/11/19  Yes Irine Seal, MD  metoprolol tartrate (LOPRESSOR) 25 MG tablet Take 1 tablet by mouth twice daily Patient taking differently: Take 25 mg by mouth 2 (two) times daily.  03/08/19  Yes Biagio Borg, MD  Potassium 99 MG TABS Take 99 mg by mouth daily.    Yes [provider]  sulfamethoxazole-trimethoprim (BACTRIM DS) 800-160 MG tablet Take 1 tablet by mouth 2 (two) times daily. Start the day prior to foley removal appointment Patient not taking: Reported on 04/01/2019 03/09/19   Debbrah Alar, PA-C    Family History Family History  Problem Relation Age of Onset  . Diabetes Mother   . Stroke Brother   . Hearing loss Brother    . Colon cancer Neg Hx   . Rectal cancer Neg Hx   . Stomach cancer Neg Hx   . Esophageal cancer Neg Hx     Social History Social History   Tobacco Use  . Smoking status: Former Smoker    Types: Cigarettes    Quit date: 02/26/1980    Years since quitting: 39.1  . Smokeless tobacco: Never Used  . Tobacco comment: Quit 1981  Substance Use Topics  . Alcohol use: No    Alcohol/week: 0.0 standard drinks    Frequency: Never    Comment: Quit 2004  . Drug use: No     Allergies   Patient has no known allergies.   Review of Systems Review of Systems  Constitutional: Negative for fever.  Respiratory: Positive for shortness of breath.   Cardiovascular: Negative for chest pain.  Musculoskeletal: Positive for arthralgias, gait problem, joint swelling  and myalgias.  Skin: Negative for color change, rash and wound.  Allergic/Immunologic: Negative for immunocompromised state.  Neurological: Negative for weakness and numbness.  Hematological: Does not bruise/bleed easily.  Psychiatric/Behavioral: Negative for confusion.  All other systems reviewed and are negative.    Physical Exam Updated Vital Signs BP (!) 159/90   Pulse 70   Temp 99 F (37.2 C)   Resp 16   SpO2 98%   Physical Exam Vitals signs and nursing note reviewed.  Constitutional:      General: He is not in acute distress.    Appearance: He is well-developed. He is not diaphoretic.  HENT:     Head: Normocephalic and atraumatic.  Cardiovascular:     Rate and Rhythm: Normal rate and regular rhythm.     Pulses: Normal pulses.     Heart sounds: Normal heart sounds.  Pulmonary:     Effort: Pulmonary effort is normal.     Breath sounds: Normal breath sounds.  Abdominal:     Palpations: Abdomen is soft.     Tenderness: There is no abdominal tenderness.  Musculoskeletal:        General: Swelling and tenderness present. No deformity or signs of injury.     Comments: Swelling noted to bilateral lower legs, patient  is unsure if this is new, states he has gout. No skin changes, no erythema, no palpable cords. Mild tenderness to bilateral calf areas.  Skin:    General: Skin is warm and dry.     Findings: No erythema or rash.  Neurological:     Mental Status: He is alert and oriented to person, place, and time.  Psychiatric:        Behavior: Behavior normal.      ED Treatments / Results  Labs (all labs ordered are listed, but only abnormal results are displayed) Labs Reviewed  BASIC METABOLIC PANEL - Abnormal; Notable for the following components:      Result Value   Glucose, Bld 106 (*)    BUN 31 (*)    Creatinine, Ser 2.47 (*)    Calcium 8.3 (*)    GFR calc non Af Amer 26 (*)    GFR calc Af Amer 30 (*)    All other components within normal limits  CBC WITH DIFFERENTIAL/PLATELET - Abnormal; Notable for the following components:   RBC 2.87 (*)    Hemoglobin 7.3 (*)    HCT 25.2 (*)    MCH 25.4 (*)    MCHC 29.0 (*)    All other components within normal limits    EKG None  Radiology Vas [place] Lower Extremity Venous (dvt) (only [facility])  Result Date: 04/04/2019  Lower Venous Study Indications: Swelling, and Edema.  Comparison Study: previous study 10/24/16 Performing Technologist: Abram Sander RVS  Examination Guidelines: A complete evaluation includes B-mode imaging, spectral Doppler, color Doppler, and power Doppler as needed of all accessible portions of each vessel. Bilateral testing is considered an integral part of a complete examination. Limited examinations for reoccurring indications may be performed as noted.  +---------+---------------+---------+-----------+----------+--------------+ RIGHT    CompressibilityPhasicitySpontaneityPropertiesThrombus Aging +---------+---------------+---------+-----------+----------+--------------+ CFV      Full           Yes      Yes                                 +---------+---------------+---------+-----------+----------+--------------+ SFJ       Full                                                        +---------+---------------+---------+-----------+----------+--------------+  FV Prox  Full                                                        +---------+---------------+---------+-----------+----------+--------------+ FV Mid   Full                                                        +---------+---------------+---------+-----------+----------+--------------+ FV DistalFull                                                        +---------+---------------+---------+-----------+----------+--------------+ PFV      Full                                                        +---------+---------------+---------+-----------+----------+--------------+ POP      Full           Yes      Yes                                 +---------+---------------+---------+-----------+----------+--------------+ PTV      Full                                                        +---------+---------------+---------+-----------+----------+--------------+ PERO     Full                                                        +---------+---------------+---------+-----------+----------+--------------+   +---------+---------------+---------+-----------+----------+--------------+ LEFT     CompressibilityPhasicitySpontaneityPropertiesThrombus Aging +---------+---------------+---------+-----------+----------+--------------+ CFV      Full           Yes      Yes                                 +---------+---------------+---------+-----------+----------+--------------+ SFJ      Full                                                        +---------+---------------+---------+-----------+----------+--------------+ FV Prox  Full                                                        +---------+---------------+---------+-----------+----------+--------------+  FV Mid   Full                                                         +---------+---------------+---------+-----------+----------+--------------+ FV DistalFull                                                        +---------+---------------+---------+-----------+----------+--------------+ PFV      Full                                                        +---------+---------------+---------+-----------+----------+--------------+ POP      Full           Yes      Yes                                 +---------+---------------+---------+-----------+----------+--------------+ PTV      Full                                                        +---------+---------------+---------+-----------+----------+--------------+ PERO     Full                                                        +---------+---------------+---------+-----------+----------+--------------+     Summary: Right: There is no evidence of deep vein thrombosis in the lower extremity. No cystic structure found in the popliteal fossa. Left: There is no evidence of deep vein thrombosis in the lower extremity. No cystic structure found in the popliteal fossa.  *See table(s) above for measurements and observations. Electronically signed by Harold Barban MD on 04/04/2019 at 1:11:09 PM.    Final     Procedures Procedures (including critical care time)  Medications Ordered in ED Medications - No data to display   Initial Impression / Assessment and Plan / ED Course  I have reviewed the triage vital signs and the nursing notes.  Pertinent labs & imaging results that were available during my care of the patient were reviewed by me and considered in my medical decision making (see chart for details).  Clinical Course as of Apr 04 1431  Wed Apr 04, 7319  1278 68 year old male presents with complaint of pain in bilateral lower legs, told he should come for Doppler to rule out DVT due to surgery 3 weeks ago.  On exam patient has swelling of bilateral lower  extremities, no pitting edema.  DP pulses present bilaterally, sensation intact.  Mild tenderness bilateral calves without palpable cords, no erythema.  Patient has history of osteoarthritis in his knees, reports chronic knee pain.  Venous Doppler is negative  for DVT in either leg.  Lab work appears consistent with previous.  Discussed anemia with patient, he is not currently on iron supplement, advised to take multivitamin with iron.  Case discussed with Dr. Ronnald Nian, ER attending, agrees with plan of care. Patient to recheck with PCP.    [LM]    Clinical Course User Index [LM] Tacy Learn, PA-C      Final Clinical Impressions(s) / ED Diagnoses   Final diagnoses:  Bilateral leg pain    ED Discharge Orders    None       Tacy Learn, PA-C 04/04/19 1432    Lennice Sites, DO 04/04/19 1500

## 2019-04-06 ENCOUNTER — Encounter: Payer: Self-pay | Admitting: Cardiovascular Disease

## 2019-04-06 ENCOUNTER — Other Ambulatory Visit: Payer: Self-pay

## 2019-04-06 ENCOUNTER — Ambulatory Visit: Payer: Medicare Other | Admitting: Cardiovascular Disease

## 2019-04-06 VITALS — BP 142/88 | HR 65 | Ht 73.0 in | Wt 249.1 lb

## 2019-04-06 DIAGNOSIS — I10 Essential (primary) hypertension: Secondary | ICD-10-CM

## 2019-04-06 DIAGNOSIS — R079 Chest pain, unspecified: Secondary | ICD-10-CM

## 2019-04-06 DIAGNOSIS — R0989 Other specified symptoms and signs involving the circulatory and respiratory systems: Secondary | ICD-10-CM

## 2019-04-06 MED ORDER — AMLODIPINE BESYLATE 5 MG PO TABS: 5.0000 mg | ORAL_TABLET | Freq: Every day | ORAL | 11 refills | Status: DC

## 2019-04-06 NOTE — Patient Instructions (Signed)
Medication Instructions:  Your physician has recommended you make the following change in your medication:  START Amlodipine 5 mg once daily  *If you need a refill on your cardiac medications before your next appointment, please call your pharmacy*  Lab Work: None Ordered    Testing/Procedures: Your physician has requested that you have an echocardiogram. Echocardiography is a painless test that uses sound waves to create images of your heart. It provides your doctor with information about the size and shape of your heart and how well your heart's chambers and valves are working. This procedure takes approximately one hour. There are no restrictions for this procedure.    Follow-Up: At [facility], you and your health needs are our priority.  As part of our continuing mission to provide you with exceptional heart care, we have created designated Provider Care Teams.  These Care Teams include your primary Cardiologist (physician) and Advanced Practice Providers (APPs -  Physician Assistants and Nurse Practitioners) who all work together to provide you with the care you need, when you need it.  Your next appointment:   6 months  The format for your next appointment:   Either In Person or Virtual  Provider:   You may see Dr. Acie Fredrickson or one of the following Advanced Practice Providers on your designated Care Team:    Richardson Dopp, PA-C  [place], [place]  Daune Perch, [place]

## 2019-04-06 NOTE — Progress Notes (Signed)
Cardiology Office Note:    Date:  04/06/2019   ID:  [name], DOB 1950/09/26, MRN [id number]  PCP:  Biagio Borg, MD  Cardiologist:  Adelyn Roscher Electrophysiologist:  None   Referring MD: Biagio Borg, MD   Chief Complaint  Patient presents with  . Chest Pain    Problem List 1. Chest pain  2. CKD -  3.  HTN   Oct. 30, 2020    [name] is a 68 y.o. male with a hx of chest discomfort.  We performed a stress Myoview study.  He was found to have apical thinning.  Ejection fraction was calculated to be 38%.  Echocardiogram from 2018 revealed normal left ventricular systolic function.  No significant CP .   Has slight cp.  Has not been exercising much due to his prostate cancer on Oct. 2, 2020   Is having some leg pain - started after his robotic prostate surgery ( oct. 2, 2020 )  Has been to the ER,   Had venous duplex which was negative for DVT He was found to be anemic and he started on multivitamin with iron. Has to do I/O self cath due to prostate enlargement .  Has significant CKD      Past Medical History:  Diagnosis Date  . Anemia    normal Fe, nl B12, nl retic, nl EPO July '13  . Blood transfusion without reported diagnosis   . BPH (benign prostatic hyperplasia)   . Chronic back pain   . Chronic kidney disease    CKD III, obstructive nephropathy  . Diverticulosis   . Dysuria   . Elevated PSA, greater than or equal to 20 ng/ml June '13   PSA 107  . Hemorrhoids, internal, with bleeding, prolapse 09/19/2014  . Hyperlipidemia 05/29/2014  . Hypertension   . Obstructive uropathy 11/27/2015  . Renal insufficiency   . Tuberculosis    h/o PPD +  . UTI (urinary tract infection)   . Vitamin D deficiency 09/13/2017    Past Surgical History:  Procedure Laterality Date  . CARPAL TUNNEL RELEASE Right   . COLONOSCOPY    . HEMORRHOID BANDING    . SPLENECTOMY    . XI ROBOTIC ASSISTED SIMPLE PROSTATECTOMY N/A 03/09/2019   Procedure: XI ROBOTIC ASSISTED SIMPLE  PROSTATECTOMY;  Surgeon: Cleon Gustin, MD;  Location: [facility];  Service: Urology;  Laterality: N/A;    Current Medications: Current Meds  Medication Sig  . acetaminophen (TYLENOL) 500 MG tablet Take 1,000 mg by mouth every 6 (six) hours as needed for moderate pain.   Marland Kitchen allopurinol (ZYLOPRIM) 100 MG tablet Take 2 tablets (200 mg total) by mouth daily.  Marland Kitchen atorvastatin (LIPITOR) 40 MG tablet Take 1 tablet by mouth once daily  . cephALEXin (KEFLEX) 250 MG capsule Take 1 capsule (250 mg total) by mouth 3 (three) times daily.  . colchicine 0.6 MG tablet Take 0.6 mg by mouth daily as needed (gout).   . finasteride (PROSCAR) 5 MG tablet TAKE 1 TABLET BY MOUTH ONCE DAILY  . furosemide (LASIX) 20 MG tablet Take 20 mg by mouth daily.  Marland Kitchen HYDROcodone-acetaminophen (NORCO) 5-325 MG tablet Take 1-2 tablets by mouth every 6 (six) hours as needed for moderate pain or severe pain.  Marland Kitchen Hyoscyamine Sulfate SL (LEVSIN/SL) 0.125 MG SUBL Place 0.125 mg under the tongue every 6 (six) hours as needed (bladder spasm).  . metoprolol tartrate (LOPRESSOR) 25 MG tablet Take 1 tablet by mouth twice daily  . Potassium  99 MG TABS Take 99 mg by mouth daily.      Allergies:   Patient has no known allergies.   Social History   Socioeconomic History  . Marital status: Single    Spouse name: Not on file  . Number of children: 2  . Years of education: 47  . Highest education level: Some college, no degree  Occupational History  . 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 Financial resource strain: Somewhat hard  . Food insecurity    Worry: Never true    Inability: Never true  . Transportation needs    Medical: Yes    Non-medical: Yes  Tobacco Use  . Smoking status: Former Smoker    Types: Cigarettes    Quit date: 02/26/1980    Years since quitting: 39.1  . Smokeless tobacco: Never Used  . Tobacco comment: Quit 1981  Substance and Sexual Activity  .  Alcohol use: No    Alcohol/week: 0.0 standard drinks    Frequency: Never    Comment: Quit 2004  . Drug use: No  . Sexual activity: Never  Lifestyle  . Physical activity    Days per week: 3 days    Minutes per session: 10 min  . Stress: Only a little  Relationships  . Social Herbalist on phone: Three times a week    Gets together: Once a week    Attends religious service: 1 to 4 times per year    Active member of club or organization: Yes    Attends meetings of clubs or organizations: 1 to 4 times per year    Relationship status: Married  Other Topics Concern  . Not on file  Social History Narrative   Native of [place], raised poor farming community. . To [place] '78 - finished College, all but Arts administrator. Married - wife in [place], blocked from [place] to [place]. 1 son '90; 1 dtr '93. 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 Resources depleted.   Right handed.   Caffeine Hot daily one daily.     Family History: The patient's family history includes Diabetes in his mother; Hearing loss in his brother; Stroke in his brother. There is no history of Colon cancer, Rectal cancer, Stomach cancer, or Esophageal cancer.  ROS:   Please see the history of present illness.     All other systems reviewed and are negative.  EKGs/Labs/Other Studies Reviewed:    The following studies were reviewed today:   EKG:   OCt. 24, 2020:   NSR , RBBB , no ST or T wave changes.   Recent Labs: 04/04/2019: BUN 31; Creatinine, Ser 2.47; Hemoglobin 7.3; Platelets 370; Potassium 3.9; Sodium 138  Recent Lipid Panel    Component Value Date/Time   CHOL 134 06/30/2017 1049   TRIG 67.0 06/30/2017 1049   HDL 52.90 06/30/2017 1049   CHOLHDL 3 06/30/2017 1049   VLDL 13.4 06/30/2017 1049   LDLCALC 68 06/30/2017 1049    Physical Exam:    VS:  BP (!) 142/88   Pulse 65   Ht 6\' 1"  (1.854 m)   Wt 249 lb 1.9 oz (113 kg)   SpO2 97%   BMI 32.87 kg/m     Wt  Readings from Last 3 Encounters:  04/06/19 249 lb 1.9 oz (113 kg)  03/28/19 248 lb (112.5 kg)  03/09/19 244 lb 0.8 oz (110.7  kg)     GEN:  Well nourished, well developed in no acute distress HEENT: Normal NECK: No JVD; No carotid bruits LYMPHATICS: No lymphadenopathy CARDIAC: RR , soft systolic murmur  RESPIRATORY:  Clear to auscultation without rales, wheezing or rhonchi  ABDOMEN: Soft, non-tender, non-distended MUSCULOSKELETAL:  No edema; No deformity  SKIN: Warm and dry NEUROLOGIC:  Alert and oriented x 3 PSYCHIATRIC:  Normal affect   ASSESSMENT:    1. Chest pain, unspecified type   2. Depressed left ventricular ejection fraction    PLAN:    In order of problems listed above:  1.  chest pain:   Had a Myoview study which did not reveal any ischemia.  He had apical thinning but I doubt that he has had an apical myocardial infarction.  The ejection fraction during the Myoview scan was 38% but this could be an error.  We will get an echocardiogram for further assessment of his left ventricular function.  He is not having any significant episodes of chest discomfort at this time.  He has significant chronic kidney disease so doing a heart catheterization would be risky for end-stage renal disease.     2.  Leg pain.  He wondered whether or not he has peripheral arterial disease because he is at some leg pain.  The leg pains occurred following his robotic surgery for his prostate.  He has excellent pulses in his feet so there is no evidence of PAD.  3.  Chronic kidney disease:   Followed by nephrology .   4.  Hypertension: Blood pressure is mildly elevated today.  I suspect a lot of this is from his chronic kidney disease.  We will add amlodipine 5 mg a day.  Medication Adjustments/Labs and Tests Ordered: Current medicines are reviewed at length with the patient today.  Concerns regarding medicines are outlined above.  Orders Placed This Encounter  Procedures  . ECHOCARDIOGRAM  COMPLETE   Meds ordered this encounter  Medications  . amLODipine (NORVASC) 5 MG tablet    Sig: Take 1 tablet (5 mg total) by mouth daily.    Dispense:  30 tablet    Refill:  11    Patient Instructions  Medication Instructions:  Your physician has recommended you make the following change in your medication:  START Amlodipine 5 mg once daily  *If you need a refill on your cardiac medications before your next appointment, please call your pharmacy*  Lab Work: None Ordered    Testing/Procedures: Your physician has requested that you have an echocardiogram. Echocardiography is a painless test that uses sound waves to create images of your heart. It provides your doctor with information about the size and shape of your heart and how well your heart's chambers and valves are working. This procedure takes approximately one hour. There are no restrictions for this procedure.    Follow-Up: At [facility], you and your health needs are our priority.  As part of our continuing mission to provide you with exceptional heart care, we have created designated Provider Care Teams.  These Care Teams include your primary Cardiologist (physician) and Advanced Practice Providers (APPs -  Physician Assistants and Nurse Practitioners) who all work together to provide you with the care you need, when you need it.  Your next appointment:   6 months  The format for your next appointment:   Either In Person or Virtual  Provider:   You may see Dr. Acie Fredrickson or one of the following Advanced Practice Providers  on your designated Care Team:    Richardson Dopp, PA-C  Vin Rossie, PA-C  Daune Perch, [place]       Signed, Mertie Moores, MD  04/06/2019 10:18 AM    [place]

## 2019-04-09 ENCOUNTER — Other Ambulatory Visit: Payer: Self-pay | Admitting: *Deleted

## 2019-04-09 ENCOUNTER — Other Ambulatory Visit: Payer: Self-pay

## 2019-04-09 NOTE — Patient Outreach (Signed)
Telephone outreach.  Pt has a hospital visit last week. His legs continued to have swelling and he called the nurse line and was advised to go to the ED and be evaluated to make sure he did not have a DVT. He did go and he did not have a DVT. He did however have another UTI for which he was treated and has finished his antibiotic.   He reports a new problem since he had the prostatectomy, bladder spasms. These are uncomfortable and cause urinary incontinence. He asks how long he should expect to have these. He does have an antispasmotic and uses it when he has a spasm.  He has an appt with his urologist next week.  I have suggested to him that he take the Levsin every morning on an empty stomach for the next week and keep a record of the spasms that he may have. We can discuss next week and decide if we should increase it to two times a day. He can then report the issue and if this regimen has been helpful and be advised how long could he continue this regimen.  Regarding his edema, we had previously talked about wearing support stockings but he has not been able to get any. I offered to order them for him and he has agreed. I will order a pair of moderate compression 15-20 mm/HG and one with high compression 20-30 mm/Hg. These will be in a coolmax material, knee high and black. Will have them delivered to his home.  We did discuss reaching out to his provider again before going to the ED.  I will follow up again in one week.  [name] Myrtie Neither, MSN, [facility] Gerontological Nurse Practitioner [facility] Care Management [phone]

## 2019-04-11 ENCOUNTER — Other Ambulatory Visit: Payer: Self-pay | Admitting: Internal Medicine

## 2019-04-11 DIAGNOSIS — I129 Hypertensive chronic kidney disease with stage 1 through stage 4 chronic kidney disease, or unspecified chronic kidney disease: Secondary | ICD-10-CM | POA: Diagnosis not present

## 2019-04-11 DIAGNOSIS — D631 Anemia in chronic kidney disease: Secondary | ICD-10-CM | POA: Diagnosis not present

## 2019-04-11 DIAGNOSIS — N2581 Secondary hyperparathyroidism of renal origin: Secondary | ICD-10-CM | POA: Diagnosis not present

## 2019-04-11 DIAGNOSIS — N183 Chronic kidney disease, stage 3 unspecified: Secondary | ICD-10-CM | POA: Diagnosis not present

## 2019-04-11 DIAGNOSIS — N189 Chronic kidney disease, unspecified: Secondary | ICD-10-CM | POA: Diagnosis not present

## 2019-04-11 DIAGNOSIS — N390 Urinary tract infection, site not specified: Secondary | ICD-10-CM | POA: Diagnosis not present

## 2019-04-12 ENCOUNTER — Encounter: Payer: Self-pay | Admitting: Internal Medicine

## 2019-04-12 ENCOUNTER — Ambulatory Visit: Payer: Medicare Other | Admitting: Internal Medicine

## 2019-04-12 DIAGNOSIS — E785 Hyperlipidemia, unspecified: Secondary | ICD-10-CM

## 2019-04-12 NOTE — Progress Notes (Signed)
Patient ID: [name], male   DOB: 1951-03-21, 68 y.o.   MRN: [id number]  Pt no show after texting twice for video  Unable for phone as I keep getting "unable to connect to this number" type of message

## 2019-04-17 ENCOUNTER — Ambulatory Visit (HOSPITAL_COMMUNITY): Payer: Medicare Other | Attending: Cardiovascular Disease

## 2019-04-17 ENCOUNTER — Other Ambulatory Visit: Payer: Self-pay | Admitting: *Deleted

## 2019-04-17 ENCOUNTER — Other Ambulatory Visit: Payer: Self-pay

## 2019-04-17 DIAGNOSIS — R079 Chest pain, unspecified: Secondary | ICD-10-CM | POA: Diagnosis not present

## 2019-04-17 DIAGNOSIS — R310 Gross hematuria: Secondary | ICD-10-CM | POA: Diagnosis not present

## 2019-04-17 DIAGNOSIS — R338 Other retention of urine: Secondary | ICD-10-CM | POA: Diagnosis not present

## 2019-04-17 DIAGNOSIS — R0989 Other specified symptoms and signs involving the circulatory and respiratory systems: Secondary | ICD-10-CM

## 2019-04-17 DIAGNOSIS — N453 Epididymo-orchitis: Secondary | ICD-10-CM | POA: Diagnosis not present

## 2019-04-17 NOTE — Patient Outreach (Signed)
Telephone outreach.  Mr. Lestage has a new problem to report today (epidymytis). He has had a OV with urology and has been put on an antibiotic. He reports he is still swollen and tender.  He is walking some but now with this new problem that has also limited his mobility.  His is voiding well. No blood noted. He is working on [employer] for improved bladder control.  Denies gout pain today.  Occasional dizziness, blood pressure does run high rather than low.   [facility] CM Care Plan Problem One     Most Recent Value  Care Plan Problem One  potential for readmission due to recent hospitalization  Role Documenting the Problem One  Care Management Telephonic Coordinator  Care Plan for Problem One  Active  [facility] Long Term Goal   patient will report no hospital readmission within 45 days  [facility] Long Term Goal Start Date  02/26/19    [facility] CM Care Plan Problem Two     Most Recent Value  Care Plan Problem Two  Pt to complete his advanced directives over the next 30 days.  Role Documenting the Problem Two  Care Management [place] for Problem Two  Active  [facility] CM Short Term Goal #1   Pt will complete his Advanced Directives within the next 30 days.  [facility] CM Short Term Goal #1 Start Date  04/09/19  Interventions for Short Term Goal #2   Discussed the importance of letting his medical wishes be known in a document.    [facility] CM Care Plan Problem Three     Most Recent Value  Care Plan Problem Three  High risk for falls due to painful gout in L knee.  Role Documenting the Problem Three  Care Management Coordinator  Care Plan for Problem Three  Active  [facility] Long Term Goal   Pt will not report fall with injury over the next 45 days, .  [facility] Long Term Goal Start Date  03/15/19     Discussed early calls to MD for problem intervention. Suggested home blood pressure monitor.  I will call pt next week. He knows he can call me also.  [name] Myrtie Neither, MSN, [facility] Gerontological  Nurse Practitioner [facility] Care Management [phone]

## 2019-04-25 DIAGNOSIS — N183 Chronic kidney disease, stage 3 unspecified: Secondary | ICD-10-CM | POA: Diagnosis not present

## 2019-04-25 DIAGNOSIS — N189 Chronic kidney disease, unspecified: Secondary | ICD-10-CM | POA: Diagnosis not present

## 2019-04-25 DIAGNOSIS — D631 Anemia in chronic kidney disease: Secondary | ICD-10-CM | POA: Diagnosis not present

## 2019-04-25 DIAGNOSIS — I129 Hypertensive chronic kidney disease with stage 1 through stage 4 chronic kidney disease, or unspecified chronic kidney disease: Secondary | ICD-10-CM | POA: Diagnosis not present

## 2019-05-01 ENCOUNTER — Other Ambulatory Visit: Payer: Self-pay | Admitting: Internal Medicine

## 2019-05-16 ENCOUNTER — Ambulatory Visit (INDEPENDENT_AMBULATORY_CARE_PROVIDER_SITE_OTHER): Payer: Medicare Other | Admitting: Family Medicine

## 2019-05-16 ENCOUNTER — Ambulatory Visit: Payer: Self-pay

## 2019-05-16 ENCOUNTER — Other Ambulatory Visit: Payer: Self-pay

## 2019-05-16 ENCOUNTER — Encounter: Payer: Self-pay | Admitting: Family Medicine

## 2019-05-16 ENCOUNTER — Other Ambulatory Visit: Payer: Medicare Other

## 2019-05-16 VITALS — BP 122/74 | HR 69 | Ht 73.0 in | Wt 249.0 lb

## 2019-05-16 DIAGNOSIS — G8929 Other chronic pain: Secondary | ICD-10-CM

## 2019-05-16 DIAGNOSIS — M2550 Pain in unspecified joint: Secondary | ICD-10-CM

## 2019-05-16 DIAGNOSIS — M25561 Pain in right knee: Secondary | ICD-10-CM | POA: Diagnosis not present

## 2019-05-16 DIAGNOSIS — M170 Bilateral primary osteoarthritis of knee: Secondary | ICD-10-CM | POA: Diagnosis not present

## 2019-05-16 NOTE — Assessment & Plan Note (Signed)
Only right knee was injected today.  Tolerated procedure well.  Discussed icing regimen and home exercise, discussed which activities to do which wants to avoid.  Patient will try to continue with conservative therapy.  Wants to avoid any surgical intervention.  Could be candidate for viscosupplementation again.  Follow-up again in 4 to 8 weeks

## 2019-05-16 NOTE — Progress Notes (Signed)
[name] [address] [place], [place] [address] Phone: [phone] Subjective:   [name], am serving as a scribe for Dr. Hulan Saas. This visit occurred during the SARS-CoV-2 public health emergency.  Safety protocols were in place, including screening questions prior to the visit, additional usage of staff PPE, and extensive cleaning of exam room while observing appropriate contact time as indicated for disinfecting solutions.    CC: Bilateral knee pain  [id number]   03/28/2019 Repeat injection in the knees given today.  Discussed icing regimen and home exercise, we discussed which appears to do which wants to avoid.  Patient will increase activity as tolerated.  Hoping the patient has just as much good success.  Do not want to do significant number of medication secondary to other comorbidities.  Could be a candidate for viscosupplementation if necessary.  Follow-up again in 6 to 8 weeks  Update 05/16/2019 [name] is a 68 y.o. male coming in with complaint of bilateral knee pain. Patient states that his knee pain has been increasing for 2 days.  States it is worse on the right side.  Patient has been seen in the emergency department on multiple occasions for other different difficulties.  Has known significant arthritic changes of the joints.     Past Medical History:  Diagnosis Date  . Anemia    normal Fe, nl B12, nl retic, nl EPO July '13  . Blood transfusion without reported diagnosis   . BPH (benign prostatic hyperplasia)   . Chronic back pain   . Chronic kidney disease    CKD III, obstructive nephropathy  . Diverticulosis   . Dysuria   . Elevated PSA, greater than or equal to 20 ng/ml June '13   PSA 107  . Hemorrhoids, internal, with bleeding, prolapse 09/19/2014  . Hyperlipidemia 05/29/2014  . Hypertension   . Obstructive uropathy 11/27/2015  . Renal insufficiency   . Tuberculosis    h/o PPD +  . UTI (urinary tract  infection)   . Vitamin D deficiency 09/13/2017   Past Surgical History:  Procedure Laterality Date  . CARPAL TUNNEL RELEASE Right   . COLONOSCOPY    . HEMORRHOID BANDING    . SPLENECTOMY    . XI ROBOTIC ASSISTED SIMPLE PROSTATECTOMY N/A 03/09/2019   Procedure: XI ROBOTIC ASSISTED SIMPLE PROSTATECTOMY;  Surgeon: Cleon Gustin, MD;  Location: [facility];  Service: Urology;  Laterality: N/A;   Social History   Socioeconomic History  . Marital status: Single    Spouse name: Not on file  . Number of children: 2  . Years of education: 72  . Highest education level: Some college, [name] degree  Occupational History  . 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 Financial resource strain: Somewhat hard  . Food insecurity    Worry: Never true    Inability: Never true  . Transportation needs    Medical: Yes    Non-medical: Yes  Tobacco Use  . Smoking status: Former Smoker    Types: Cigarettes    Quit date: 02/26/1980    Years since quitting: 39.2  . Smokeless tobacco: Never Used  . Tobacco comment: Quit 1981  Substance and Sexual Activity  . Alcohol use: [name]    Alcohol/week: 0.0 standard drinks    Frequency: Never    Comment: Quit 2004  . Drug use: [name]  . Sexual activity: Never  Lifestyle  .  Physical activity    Days per week: 3 days    Minutes per session: 10 min  . Stress: Only a little  Relationships  . Social Herbalist on phone: Three times a week    Gets together: Once a week    Attends religious service: 1 to 4 times per year    Active member of club or organization: Yes    Attends meetings of clubs or organizations: 1 to 4 times per year    Relationship status: Married  Other Topics Concern  . Not on file  Social History Narrative   Native of [place], raised poor farming community. . To [place] '78 - finished College, all but Arts administrator. Married - wife in [place], blocked from [place] to [place]. 1 son  '90; 1 dtr '93. 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 Resources depleted.   Right handed.   Caffeine Hot daily one daily.   [name] Known Allergies Family History  Problem Relation Age of Onset  . Diabetes Mother   . Stroke Brother   . Hearing loss Brother   . Colon cancer Neg Hx   . Rectal cancer Neg Hx   . Stomach cancer Neg Hx   . Esophageal cancer Neg Hx      Current Outpatient Medications (Cardiovascular):  .  amLODipine (NORVASC) 5 MG tablet, Take 1 tablet (5 mg total) by mouth daily. Marland Kitchen  atorvastatin (LIPITOR) 40 MG tablet, Take 1 tablet by mouth once daily .  furosemide (LASIX) 20 MG tablet, Take 20 mg by mouth daily. .  metoprolol tartrate (LOPRESSOR) 25 MG tablet, Take 1 tablet by mouth twice daily   Current Outpatient Medications (Analgesics):  .  acetaminophen (TYLENOL) 500 MG tablet, Take 1,000 mg by mouth every 6 (six) hours as needed for moderate pain.  Marland Kitchen  allopurinol (ZYLOPRIM) 100 MG tablet, Take 2 tablets (200 mg total) by mouth daily. .  colchicine 0.6 MG tablet, Take 1 tablet by mouth once daily .  HYDROcodone-acetaminophen (NORCO) 5-325 MG tablet, Take 1-2 tablets by mouth every 6 (six) hours as needed for moderate pain or severe pain.   Current Outpatient Medications (Other):  .  cephALEXin (KEFLEX) 250 MG capsule, Take 1 capsule (250 mg total) by mouth 3 (three) times daily. .  finasteride (PROSCAR) 5 MG tablet, Take 1 tablet by mouth once daily .  Hyoscyamine Sulfate SL (LEVSIN/SL) 0.125 MG SUBL, Place 0.125 mg under the tongue every 6 (six) hours as needed (bladder spasm). .  Potassium 99 MG TABS, Take 99 mg by mouth daily.     Past medical history, social, surgical and family history all reviewed in electronic medical record.  [name] pertanent information unless stated regarding to the chief complaint.   Review of Systems:  [name] headache, visual changes, nausea, vomiting, diarrhea, constipation, dizziness, abdominal pain, skin  rash, fevers, chills, night sweats, weight loss, swollen lymph nodes, body aches,, chest pain, shortness of breath, mood changes.  Positive muscle aches and joint swelling  Objective  Blood pressure 122/74, pulse 69, height 6\' 1"  (1.854 m), weight 249 lb (112.9 kg), SpO2 94 %.    General: [name] apparent distress alert and oriented x3 mood and affect normal, dressed appropriately.  HEENT: Pupils equal, extraocular movements intact  Respiratory: Patient's speak in full sentences and does not appear short of breath  Cardiovascular: [name] lower extremity edema, non tender, [name] erythema  Skin: Warm dry intact with [name] signs of infection or  rash on extremities or on axial skeleton.  Abdomen: Soft nontender  Neuro: Cranial nerves II through XII are intact, neurovascularly intact in all extremities with 2+ DTRs and 2+ pulses.  Lymph: [name] lymphadenopathy of posterior or anterior cervical chain or axillae bilaterally.  Gait severely antalgic walking with the aid of a cane MSK:  tender with limited range of motion and good stability and symmetric strength and tone of shoulders, elbows, wrist, hip, and ankles bilaterally.  Arthritic changes of multiple joints  Knee: Right valgus deformity noted. Large thigh to calf ratio.  Effusion noted of the right knee Tender to palpation over medial and PF joint line.  ROM full in flexion and extension and lower leg rotation. instability with valgus force.  painful patellar compression. Patellar glide with moderate crepitus. Patellar and quadriceps tendons unremarkable. Hamstring and quadriceps strength is normal. Contralateral knee shows arthritic changes with instability but minimal pain today.  Procedure: Real-time Ultrasound Guided Injection of right knee Device: GE Logiq Q7 Ultrasound guided injection is preferred based studies that show increased duration, increased effect, greater accuracy, decreased procedural pain, increased response rate, and decreased cost  with ultrasound guided versus blind injection.  Verbal informed consent obtained.  Time-out conducted.  Noted [name] overlying erythema, induration, or other signs of local infection.  Skin prepped in a sterile fashion.  Local anesthesia: Topical Ethyl chloride.  With sterile technique and under real time ultrasound guidance: With a 22-gauge 2 inch needle patient was injected with 4 cc of 0.5% Marcaine and aspirated 30 cc of straw-colored fluid then injected 1 cc of Kenalog 40 mg/dL. This was from a superior lateral approach.  Completed without difficulty  Pain immediately resolved suggesting accurate placement of the medication.  Advised to call if fevers/chills, erythema, induration, drainage, or persistent bleeding.  Images permanently stored and available for review in the ultrasound unit.  Impression: Technically successful ultrasound guided injection.    Impression and Recommendations:     This case required medical decision making of moderate complexity. The above documentation has been reviewed and is accurate and complete Lyndal Pulley, DO       Note: This dictation was prepared with Dragon dictation along with smaller phrase technology. Any transcriptional errors that result from this process are unintentional.

## 2019-05-16 NOTE — Patient Instructions (Signed)
  [address]st [address] [place], [place] [address] Phone [phone]  See me in 4-5 weeks for gel injections

## 2019-05-17 LAB — SYNOVIAL CELL COUNT + DIFF, W/ CRYSTALS
Basophils, %: 0 %
Eosinophils-Synovial: 0 % (ref 0–2)
Lymphocytes-Synovial Fld: 65 % (ref 0–74)
Monocyte/Macrophage: 30 % (ref 0–69)
Neutrophil, Synovial: 5 % (ref 0–24)
Synoviocytes, %: 0 % (ref 0–15)
WBC, Synovial: 138 cells/uL (ref <150)

## 2019-06-06 ENCOUNTER — Ambulatory Visit: Payer: Medicare Other | Admitting: Family Medicine

## 2019-06-06 ENCOUNTER — Other Ambulatory Visit: Payer: Self-pay

## 2019-06-06 ENCOUNTER — Encounter: Payer: Self-pay | Admitting: Family Medicine

## 2019-06-06 DIAGNOSIS — M170 Bilateral primary osteoarthritis of knee: Secondary | ICD-10-CM

## 2019-06-06 NOTE — Progress Notes (Signed)
[facility] [address] [place] Phone: [phone] Subjective:   I [name] am serving as a Education administrator for Dr. Hulan Saas.  This visit occurred during the SARS-CoV-2 public health emergency.  Safety protocols were in place, including screening questions prior to the visit, additional usage of staff PPE, and extensive cleaning of exam room while observing appropriate contact time as indicated for disinfecting solutions.   I'm seeing this patient by the request  of:    CC: Bilateral knee pain follow-up  [id number]   05/16/2019 Only right knee was injected today.  Tolerated procedure well.  Discussed icing regimen and home exercise, discussed which activities to do which wants to avoid.  Patient will try to continue with conservative therapy.  Wants to avoid any surgical intervention.  Could be candidate for viscosupplementation again.  Follow-up again in 4 to 8 weeks  06/06/2019 [name] is a 68 y.o. male coming in with complaint of right knee pain. Patient states the past 3 days has been rough. States his knees are weak.   He was working with out and walking without his cane.  Patient states recently has needed a little bit more.  Patient states some more swelling right greater than left again.  Some increasing instability.     Past Medical History:  Diagnosis Date  . Anemia    normal Fe, nl B12, nl retic, nl EPO July '13  . Blood transfusion without reported diagnosis   . BPH (benign prostatic hyperplasia)   . Chronic back pain   . Chronic kidney disease    CKD III, obstructive nephropathy  . Diverticulosis   . Dysuria   . Elevated PSA, greater than or equal to 20 ng/ml June '13   PSA 107  . Hemorrhoids, internal, with bleeding, prolapse 09/19/2014  . Hyperlipidemia 05/29/2014  . Hypertension   . Obstructive uropathy 11/27/2015  . Renal insufficiency   . Tuberculosis    h/o PPD +  . UTI (urinary tract infection)    . Vitamin D deficiency 09/13/2017   Past Surgical History:  Procedure Laterality Date  . CARPAL TUNNEL RELEASE Right   . COLONOSCOPY    . HEMORRHOID BANDING    . SPLENECTOMY    . XI ROBOTIC ASSISTED SIMPLE PROSTATECTOMY N/A 03/09/2019   Procedure: XI ROBOTIC ASSISTED SIMPLE PROSTATECTOMY;  Surgeon: Cleon Gustin, MD;  Location: [facility];  Service: Urology;  Laterality: N/A;   Social History   Socioeconomic History  . Marital status: Single    Spouse name: Not on file  . Number of children: 2  . Years of education: 23  . Highest education level: Some college, no degree  Occupational History  . Occupation: Lobbyist: [employer]    Comment: unemployed  Tobacco Use  . Smoking status: Former Smoker    Types: Cigarettes    Quit date: 02/26/1980    Years since quitting: 39.3  . Smokeless tobacco: Never Used  . Tobacco comment: Quit 1981  Substance and Sexual Activity  . Alcohol use: No    Alcohol/week: 0.0 standard drinks    Comment: Quit 2004  . Drug use: No  . Sexual activity: Never  Other Topics Concern  . Not on file  Social History Narrative   Native of [place], raised poor farming community. . To [place] '78 - finished College, all but Arts administrator. Married - wife in [place], blocked from [place] to [place]. 1 son '90;  1 dtr '93. 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 Resources depleted.   Right handed.   Caffeine Hot daily one daily.   Social Determinants of Health   Financial Resource Strain: Medium Risk  . Difficulty of Paying Living Expenses: Somewhat hard  Food Insecurity: No Food Insecurity  . Worried About Charity fundraiser in the Last Year: Never true  . Ran Out of Food in the Last Year: Never true  Transportation Needs: Unmet Transportation Needs  . Lack of Transportation (Medical): Yes  . Lack of Transportation (Non-Medical): Yes  Physical Activity: Insufficiently Active  . Days of Exercise per Week:  3 days  . Minutes of Exercise per Session: 10 min  Stress: No Stress Concern Present  . Feeling of Stress : Only a little  Social Connections: Not Isolated  . Frequency of Communication with Friends and Family: Three times a week  . Frequency of Social Gatherings with Friends and Family: Once a week  . Attends Religious Services: 1 to 4 times per year  . Active Member of Clubs or Organizations: Yes  . Attends Archivist Meetings: 1 to 4 times per year  . Marital Status: Married   No Known Allergies Family History  Problem Relation Age of Onset  . Diabetes Mother   . Stroke Brother   . Hearing loss Brother   . Colon cancer Neg Hx   . Rectal cancer Neg Hx   . Stomach cancer Neg Hx   . Esophageal cancer Neg Hx      Current Outpatient Medications (Cardiovascular):  .  amLODipine (NORVASC) 5 MG tablet, Take 1 tablet (5 mg total) by mouth daily. Marland Kitchen  atorvastatin (LIPITOR) 40 MG tablet, Take 1 tablet by mouth once daily .  furosemide (LASIX) 20 MG tablet, Take 20 mg by mouth daily. .  metoprolol tartrate (LOPRESSOR) 25 MG tablet, Take 1 tablet by mouth twice daily   Current Outpatient Medications (Analgesics):  .  acetaminophen (TYLENOL) 500 MG tablet, Take 1,000 mg by mouth every 6 (six) hours as needed for moderate pain.  Marland Kitchen  allopurinol (ZYLOPRIM) 100 MG tablet, Take 2 tablets (200 mg total) by mouth daily. .  colchicine 0.6 MG tablet, Take 1 tablet by mouth once daily .  HYDROcodone-acetaminophen (NORCO) 5-325 MG tablet, Take 1-2 tablets by mouth every 6 (six) hours as needed for moderate pain or severe pain.   Current Outpatient Medications (Other):  .  cephALEXin (KEFLEX) 250 MG capsule, Take 1 capsule (250 mg total) by mouth 3 (three) times daily. .  finasteride (PROSCAR) 5 MG tablet, Take 1 tablet by mouth once daily .  Hyoscyamine Sulfate SL (LEVSIN/SL) 0.125 MG SUBL, Place 0.125 mg under the tongue every 6 (six) hours as needed (bladder spasm). .  Potassium 99 MG  TABS, Take 99 mg by mouth daily.     Past medical history, social, surgical and family history all reviewed in electronic medical record.  No pertanent information unless stated regarding to the chief complaint.   Review of Systems:  No headache, visual changes, nausea, vomiting, diarrhea, constipation, dizziness, abdominal pain, skin rash, fevers, chills, night sweats, weight loss, swollen lymph nodes, body aches,  chest pain, shortness of breath, mood changes.   Positiv muscle aches and joint swellinge Objective  Blood pressure 140/90, pulse 65, height 6\' 1"  (1.854 m), weight 251 lb (113.9 kg), SpO2 96 %.    General: No apparent distress alert and oriented x3 mood and affect  normal, dressed appropriately.  HEENT: Pupils equal, extraocular movements intact  Respiratory: Patient's speak in full sentences and does not appear short of breath  Cardiovascular: 2+ lower extremity edema, non tender, no erythema  Skin: Warm dry intact with no signs of infection or rash on extremities or on axial skeleton.  Abdomen: Soft nontender  Neuro: Cranial nerves II through XII are intact, neurovascularly intact in all extremities with 2+ DTRs and 2+ pulses.  Lymph: No lymphadenopathy of posterior or anterior cervical chain or axillae bilaterally.  Gait positive antalgic gait MSK: Moderate arthritic changes in multiple joints  Knee: Bilateral valgus deformity noted. Large thigh to calf ratio.  Effusion noted of the right knee Tender to palpation over medial and PF joint line.  ROM full in flexion and extension and lower leg rotation. instability with valgus force.  painful patellar compression. Patellar glide with moderate crepitus. Patellar and quadriceps tendons unremarkable. Hamstring and quadriceps strength is normal.  After informed written and verbal consent, patient was seated on exam table. Right knee was prepped with alcohol swab and utilizing anterolateral approach, patient's right knee  space was injected with 22 mg/mL of Monovisc (sodium hyaluronate) in a prefilled syringe was injected easily into the knee through a 22-gauge needle..Patient tolerated the procedure well without immediate complications.  After informed written and verbal consent, patient was seated on exam table. Left knee was prepped with alcohol swab and utilizing anterolateral approach, patient's left knee space was injected with 22 mg/mL of Monovisc (sodium hyaluronate) in a prefilled syringe was injected easily into the knee through a 22-gauge needle..Patient tolerated the procedure well without immediate complications.   Impression and Recommendations:     This case required medical decision making of moderate complexity. The above documentation has been reviewed and is accurate and complete Lyndal Pulley, DO       Note: This dictation was prepared with Dragon dictation along with smaller phrase technology. Any transcriptional errors that result from this process are unintentional.

## 2019-06-06 NOTE — Assessment & Plan Note (Signed)
Viscosupplementation given today bilateral.  Has responded fairly well to it in the past and hopefully will continue.  We discussed icing regimen and home exercises, we discussed topical anti-inflammatories.  Patient does have underlying gout that is likely contributing to some of the discomfort and pain as well.  Follow-up again 6 to 8 weeks.

## 2019-06-06 NOTE — Patient Instructions (Signed)
Great to see you  Happy new year.  You know the drill  See me again in 6 weeks

## 2019-06-12 ENCOUNTER — Other Ambulatory Visit: Payer: Self-pay | Admitting: Internal Medicine

## 2019-06-21 DIAGNOSIS — R338 Other retention of urine: Secondary | ICD-10-CM | POA: Diagnosis not present

## 2019-06-25 ENCOUNTER — Other Ambulatory Visit: Payer: Self-pay | Admitting: *Deleted

## 2019-06-25 NOTE — Patient Outreach (Signed)
Telephone outreach.  [name] is 69 years old today! He says he forgot it was his birthday!   He reports his leg edema is well controlled with wearing the support stockings. He denies having any falls. His gout is controlled, much better since he had a steroid injection. He is urinating without any problems and he has just seen his urologist.  He has completed his Advanced Directives but still needs to have them notarized  Today, he reports his abdomen at umbilicus is rather firm. He has noticed this over the last month. He is moving his bowels but does have a history of chronic constipation. He denies pain. He does admit to having a lot of gas. He is emptying his bladder well.  PLAN:  PT TO REPORT ANY CHANGE REGARDING HIS HEALTH STATUS, ABDOMINAL FIRMNESS, ESPECIALLY PAIN, INABILITY TO MOVE BOWELS OVER THE NEXT 3 MONTHS.  I will call him in April for follow up.  [name] Myrtie Neither, MSN, [facility] Gerontological Nurse Practitioner [facility] Care Management [phone]

## 2019-07-06 ENCOUNTER — Ambulatory Visit (INDEPENDENT_AMBULATORY_CARE_PROVIDER_SITE_OTHER): Payer: Medicare Other

## 2019-07-06 ENCOUNTER — Other Ambulatory Visit: Payer: Self-pay

## 2019-07-06 ENCOUNTER — Encounter: Payer: Self-pay | Admitting: Internal Medicine

## 2019-07-06 ENCOUNTER — Ambulatory Visit (INDEPENDENT_AMBULATORY_CARE_PROVIDER_SITE_OTHER): Payer: Medicare Other | Admitting: Internal Medicine

## 2019-07-06 VITALS — BP 148/80 | HR 73 | Temp 98.1°F | Ht 73.0 in | Wt 260.2 lb

## 2019-07-06 DIAGNOSIS — E611 Iron deficiency: Secondary | ICD-10-CM

## 2019-07-06 DIAGNOSIS — N183 Chronic kidney disease, stage 3 unspecified: Secondary | ICD-10-CM

## 2019-07-06 DIAGNOSIS — Z0001 Encounter for general adult medical examination with abnormal findings: Secondary | ICD-10-CM | POA: Diagnosis not present

## 2019-07-06 DIAGNOSIS — I10 Essential (primary) hypertension: Secondary | ICD-10-CM | POA: Diagnosis not present

## 2019-07-06 DIAGNOSIS — E538 Deficiency of other specified B group vitamins: Secondary | ICD-10-CM | POA: Diagnosis not present

## 2019-07-06 DIAGNOSIS — R7309 Other abnormal glucose: Secondary | ICD-10-CM | POA: Diagnosis not present

## 2019-07-06 DIAGNOSIS — R109 Unspecified abdominal pain: Secondary | ICD-10-CM

## 2019-07-06 DIAGNOSIS — E559 Vitamin D deficiency, unspecified: Secondary | ICD-10-CM | POA: Diagnosis not present

## 2019-07-06 DIAGNOSIS — D649 Anemia, unspecified: Secondary | ICD-10-CM | POA: Diagnosis not present

## 2019-07-06 DIAGNOSIS — R140 Abdominal distension (gaseous): Secondary | ICD-10-CM | POA: Diagnosis not present

## 2019-07-06 DIAGNOSIS — E785 Hyperlipidemia, unspecified: Secondary | ICD-10-CM

## 2019-07-06 LAB — LIPID PANEL
Cholesterol: 126 mg/dL (ref 0–200)
HDL: 61.2 mg/dL (ref >39.00)
LDL Cholesterol: 56 mg/dL (ref 0–99)
NonHDL: 64.74
Total CHOL/HDL Ratio: 2
Triglycerides: 46 mg/dL (ref 0.0–149.0)
VLDL: 9.2 mg/dL (ref 0.0–40.0)

## 2019-07-06 LAB — HEPATIC FUNCTION PANEL
ALT: 13 U/L (ref 0–53)
AST: 17 U/L (ref 0–37)
Albumin: 3.9 g/dL (ref 3.5–5.2)
Alkaline Phosphatase: 95 U/L (ref 39–117)
Bilirubin, Direct: 0.1 mg/dL (ref 0.0–0.3)
Total Bilirubin: 0.5 mg/dL (ref 0.2–1.2)
Total Protein: 6.9 g/dL (ref 6.0–8.3)

## 2019-07-06 LAB — URINALYSIS, ROUTINE W REFLEX MICROSCOPIC
Bilirubin Urine: NEGATIVE
Hgb urine dipstick: NEGATIVE
Ketones, ur: NEGATIVE
Leukocytes,Ua: NEGATIVE
Nitrite: NEGATIVE
Specific Gravity, Urine: 1.015 (ref 1.000–1.030)
Total Protein, Urine: NEGATIVE
Urine Glucose: NEGATIVE
Urobilinogen, UA: 0.2 (ref 0.0–1.0)
pH: 6 (ref 5.0–8.0)

## 2019-07-06 LAB — BASIC METABOLIC PANEL
BUN: 25 mg/dL — ABNORMAL HIGH (ref 6–23)
CO2: 35 mEq/L — ABNORMAL HIGH (ref 19–32)
Calcium: 9 mg/dL (ref 8.4–10.5)
Chloride: 100 mEq/L (ref 96–112)
Creatinine, Ser: 2.03 mg/dL — ABNORMAL HIGH (ref 0.40–1.50)
GFR: 39.6 mL/min — ABNORMAL LOW (ref >60.00)
Glucose, Bld: 115 mg/dL — ABNORMAL HIGH (ref 70–99)
Potassium: 3.3 mEq/L — ABNORMAL LOW (ref 3.5–5.1)
Sodium: 140 mEq/L (ref 135–145)

## 2019-07-06 LAB — CBC WITH DIFFERENTIAL/PLATELET
Basophils Absolute: 0.1 10*3/uL (ref 0.0–0.1)
Basophils Relative: 0.9 % (ref 0.0–3.0)
Eosinophils Absolute: 0.1 10*3/uL (ref 0.0–0.7)
Eosinophils Relative: 1.8 % (ref 0.0–5.0)
HCT: 39.6 % (ref 39.0–52.0)
Hemoglobin: 12.3 g/dL — ABNORMAL LOW (ref 13.0–17.0)
Lymphocytes Relative: 44.9 % (ref 12.0–46.0)
Lymphs Abs: 2.6 10*3/uL (ref 0.7–4.0)
MCHC: 31.1 g/dL (ref 30.0–36.0)
MCV: 80 fl (ref 78.0–100.0)
Monocytes Absolute: 0.4 10*3/uL (ref 0.1–1.0)
Monocytes Relative: 6.9 % (ref 3.0–12.0)
Neutro Abs: 2.6 10*3/uL (ref 1.4–7.7)
Neutrophils Relative %: 45.5 % (ref 43.0–77.0)
Platelets: 304 10*3/uL (ref 150.0–400.0)
RBC: 4.95 Mil/uL (ref 4.22–5.81)
RDW: 19.9 % — ABNORMAL HIGH (ref 11.5–15.5)
WBC: 5.7 10*3/uL (ref 4.0–10.5)

## 2019-07-06 LAB — PSA: PSA: 0.27 ng/mL (ref 0.10–4.00)

## 2019-07-06 LAB — HEMOGLOBIN A1C: Hgb A1c MFr Bld: 5.8 % (ref 4.6–6.5)

## 2019-07-06 LAB — TSH: TSH: 1.39 u[IU]/mL (ref 0.35–4.50)

## 2019-07-06 LAB — IBC PANEL
Iron: 111 ug/dL (ref 42–165)
Saturation Ratios: 31.8 % (ref 20.0–50.0)
Transferrin: 249 mg/dL (ref 212.0–360.0)

## 2019-07-06 LAB — VITAMIN B12: Vitamin B-12: 516 pg/mL (ref 211–911)

## 2019-07-06 LAB — LIPASE: Lipase: 66 U/L — ABNORMAL HIGH (ref 11.0–59.0)

## 2019-07-06 LAB — VITAMIN D 25 HYDROXY (VIT D DEFICIENCY, FRACTURES): VITD: 37.51 ng/mL (ref 30.00–100.00)

## 2019-07-06 NOTE — Assessment & Plan Note (Signed)
stable overall by history and exam, recent data reviewed with pt, and pt to continue medical treatment as before,  to f/u any worsening symptoms or concerns  

## 2019-07-06 NOTE — Patient Instructions (Signed)
Please continue all other medications as before, and refills have been done if requested.  Please have the pharmacy call with any other refills you may need.  Please continue your efforts at being more active, low cholesterol diet, and weight control.  You are otherwise up to date with prevention measures today.  Please keep your appointments with your specialists as you may have planned  You will be contacted regarding the referral for: General Surgury for ? Hernia  Please go to the XRAY Department in the first floor for the x-ray testing  Please go to the LAB at the blood drawing area for the tests to be done  You will be contacted by phone if any changes need to be made immediately.  Otherwise, you will receive a letter about your results with an explanation, but please check with MyChart first.  Please remember to sign up for MyChart if you have not done so, as this will be important to you in the future with finding out test results, communicating by private email, and scheduling acute appointments online when needed.  Please make an Appointment to return in 6 months, or sooner if needed

## 2019-07-06 NOTE — Assessment & Plan Note (Signed)
Developed profound anemia in [place] '13: required hospitalization and transfusion. Eval July '13: normal iron studies, normal B12, normal EPO, nl retic count, for f/u lab

## 2019-07-06 NOTE — Assessment & Plan Note (Signed)
For oral replacement 

## 2019-07-06 NOTE — Assessment & Plan Note (Signed)

## 2019-07-06 NOTE — Assessment & Plan Note (Addendum)
Suspect constipation vs other, for xray, otc miralax prn  I spent 41 minutes preparing to see the patient by review of recent labs, imaging and procedures, obtaining and reviewing separately obtained history, communicating with the patient and family or caregiver, ordering medications, tests or procedures, and documenting clinical information in the EHR including the differential Dx, treatment, and any further evaluation and other management of abd pain, hyperglycemia, anemia, CKD< HTN, HLD, vit d deficiency

## 2019-07-06 NOTE — Assessment & Plan Note (Signed)
For a1c

## 2019-07-06 NOTE — Progress Notes (Signed)
Subjective:    Patient ID: [name], male    DOB: 04-17-1951, 69 y.o.   MRN: [id number]  HPI  Here for wellness and f/u;  Overall doing ok;  Pt denies Chest pain, worsening SOB, DOE, wheezing, orthopnea, PND, worsening LE edema, palpitations, dizziness or syncope.  Pt denies neurological change such as new headache, facial or extremity weakness.  Pt denies polydipsia, polyuria, or low sugar symptoms. Pt states overall good compliance with treatment and medications, good tolerability, and has been trying to follow appropriate diet.  Pt denies worsening depressive symptoms, suicidal ideation or panic. No fever, night sweats, wt loss, loss of appetite, or other constitutional symptoms.  Pt states good ability with ADL's, has low fall risk, home safety reviewed and adequate, no other significant changes in hearing or vision, and only occasionally active with exercise. Also, c/o 1 wk abd pain mild to mod constant but with bloating and distension with worsening constipation; but no n/v, fever or bloood.  No overt bleeding. Past Medical History:  Diagnosis Date  . Anemia    normal Fe, nl B12, nl retic, nl EPO July '13  . Blood transfusion without reported diagnosis   . BPH (benign prostatic hyperplasia)   . Chronic back pain   . Chronic kidney disease    CKD III, obstructive nephropathy  . Diverticulosis   . Dysuria   . Elevated PSA, greater than or equal to 20 ng/ml June '13   PSA 107  . Hemorrhoids, internal, with bleeding, prolapse 09/19/2014  . Hyperlipidemia 05/29/2014  . Hypertension   . Obstructive uropathy 11/27/2015  . Renal insufficiency   . Tuberculosis    h/o PPD +  . UTI (urinary tract infection)   . Vitamin D deficiency 09/13/2017   Past Surgical History:  Procedure Laterality Date  . CARPAL TUNNEL RELEASE Right   . COLONOSCOPY    . HEMORRHOID BANDING    . SPLENECTOMY    . XI ROBOTIC ASSISTED SIMPLE PROSTATECTOMY N/A 03/09/2019   Procedure: XI ROBOTIC ASSISTED SIMPLE  PROSTATECTOMY;  Surgeon: Cleon Gustin, MD;  Location: [facility];  Service: Urology;  Laterality: N/A;    reports that he quit smoking about 39 years ago. His smoking use included cigarettes. He has never used smokeless tobacco. He reports that he does not drink alcohol or use drugs. family history includes Diabetes in his mother; Hearing loss in his brother; Stroke in his brother. No Known Allergies Current Outpatient Medications on File Prior to Visit  Medication Sig Dispense Refill  . acetaminophen (TYLENOL) 500 MG tablet Take 1,000 mg by mouth every 6 (six) hours as needed for moderate pain.     Marland Kitchen allopurinol (ZYLOPRIM) 100 MG tablet Take 2 tablets (200 mg total) by mouth daily. 180 tablet 3  . amLODipine (NORVASC) 5 MG tablet Take 1 tablet (5 mg total) by mouth daily. 30 tablet 11  . atorvastatin (LIPITOR) 40 MG tablet Take 1 tablet (40 mg total) by mouth daily. Keep follow=up appt in March for future refills 90 tablet 0  . cephALEXin (KEFLEX) 250 MG capsule Take 1 capsule (250 mg total) by mouth 3 (three) times daily. 21 capsule 0  . colchicine 0.6 MG tablet Take 1 tablet by mouth once daily 30 tablet 2  . metoprolol tartrate (LOPRESSOR) 25 MG tablet Take 1 tablet (25 mg total) by mouth 2 (two) times daily. (BETA BLOCKER) Keep follow=up appt in March for future refills 180 tablet 0  . Potassium 99 MG TABS Take  99 mg by mouth daily.     . finasteride (PROSCAR) 5 MG tablet Take 1 tablet by mouth once daily 90 tablet 0  . furosemide (LASIX) 20 MG tablet Take 20 mg by mouth daily.    Marland Kitchen HYDROcodone-acetaminophen (NORCO) 5-325 MG tablet Take 1-2 tablets by mouth every 6 (six) hours as needed for moderate pain or severe pain. (Patient not taking: Reported on 07/06/2019) 20 tablet 0  . Hyoscyamine Sulfate SL (LEVSIN/SL) 0.125 MG SUBL Place 0.125 mg under the tongue every 6 (six) hours as needed (bladder spasm). (Patient not taking: Reported on 07/06/2019) 30 tablet 2   No current  facility-administered medications on file prior to visit.   Review of Systems All otherwise neg per pt     Objective:   Physical Exam BP (!) 148/80 (BP Location: Left Arm, Patient Position: Sitting, Cuff Size: Large)   Pulse 73   Temp 98.1 F (36.7 C) (Oral)   Ht 6\' 1"  (1.854 m)   Wt 260 lb 3.2 oz (118 kg)   SpO2 98%   BMI 34.33 kg/m  VS noted,  Constitutional: Pt appears in NAD HENT: Head: NCAT.  Right Ear: External ear normal.  Left Ear: External ear normal.  Eyes: . Pupils are equal, round, and reactive to light. Conjunctivae and EOM are normal Nose: without d/c or deformity Neck: Neck supple. Gross normal ROM Cardiovascular: Normal rate and regular rhythm.   Pulmonary/Chest: Effort normal and breath sounds without rales or wheezing.  Abd:  Soft + BS, no organomegaly but with mild diffuse tender, mild diistension but no guarding rebound or hernia Neurological: Pt is alert. At baseline orientation, motor grossly intact Skin: Skin is warm. No rashes, other new lesions, no LE edema Psychiatric: Pt behavior is normal without agitation  All otherwise neg per pt Lab Results  Component Value Date   WBC 5.7 07/06/2019   HGB 12.3 (L) 07/06/2019   HCT 39.6 07/06/2019   PLT 304.0 07/06/2019   GLUCOSE 115 (H) 07/06/2019   CHOL 126 07/06/2019   TRIG 46.0 07/06/2019   HDL 61.20 07/06/2019   LDLCALC 56 07/06/2019   ALT 13 07/06/2019   AST 17 07/06/2019   NA 140 07/06/2019   K 3.3 (L) 07/06/2019   CL 100 07/06/2019   CREATININE 2.03 (H) 07/06/2019   BUN 25 (H) 07/06/2019   CO2 35 (H) 07/06/2019   TSH 1.39 07/06/2019   PSA 0.27 07/06/2019   INR 1.2 02/09/2019   HGBA1C 5.8 07/06/2019      Assessment & Plan:

## 2019-07-09 ENCOUNTER — Telehealth: Payer: Self-pay

## 2019-07-09 NOTE — Telephone Encounter (Signed)
Called the patient and told him the message. Patient voiced understanding

## 2019-07-09 NOTE — Telephone Encounter (Signed)
-----   Message from Biagio Borg, MD sent at 07/06/2019  9:59 PM EST ----- Regarding: surgury referral [place] to call pt - xray is consistent with constipation, so ok for otc miralax daily, and I think we can hold off on the general surgury referral

## 2019-07-13 DIAGNOSIS — R109 Unspecified abdominal pain: Secondary | ICD-10-CM | POA: Diagnosis not present

## 2019-07-13 DIAGNOSIS — N453 Epididymo-orchitis: Secondary | ICD-10-CM | POA: Diagnosis not present

## 2019-07-13 DIAGNOSIS — R1084 Generalized abdominal pain: Secondary | ICD-10-CM | POA: Diagnosis not present

## 2019-07-25 ENCOUNTER — Telehealth: Payer: Self-pay

## 2019-07-25 NOTE — Telephone Encounter (Signed)
Called patient discussed lab results. Patient understood

## 2019-07-25 NOTE — Telephone Encounter (Signed)
New message    The patient received his lab results in the mail would like to discuss test results.

## 2019-08-03 ENCOUNTER — Ambulatory Visit: Payer: Medicare Other | Attending: Internal Medicine

## 2019-08-03 DIAGNOSIS — Z23 Encounter for immunization: Secondary | ICD-10-CM | POA: Insufficient documentation

## 2019-08-03 NOTE — Progress Notes (Signed)
   Covid-19 Vaccination Clinic  Name:  [name]    MRN: [id number] DOB: 1951-04-17  08/03/2019  [name] was observed post Covid-19 immunization for 15 minutes without incidence. He was provided with Vaccine Information Sheet and instruction to access the V-Safe system.   [name] was instructed to call 911 with any severe reactions post vaccine: Marland Kitchen Difficulty breathing  . Swelling of your face and throat  . A fast heartbeat  . A bad rash all over your body  . Dizziness and weakness    Immunizations Administered    Name Date Dose VIS Date Route   Pfizer COVID-19 Vaccine 08/03/2019  9:49 AM 0.3 mL 05/18/2019 Intramuscular   Manufacturer: [place]   Lot: [id number]   [place]: [id number]

## 2019-08-05 ENCOUNTER — Other Ambulatory Visit: Payer: Self-pay | Admitting: Internal Medicine

## 2019-08-05 NOTE — Telephone Encounter (Signed)
Please refill as per office routine med refill policy (all routine meds refilled for 3 mo or monthly per pt preference up to one year from last visit, then month to month grace period for 3 mo, then further med refills will have to be denied)  

## 2019-08-16 ENCOUNTER — Encounter: Payer: Self-pay | Admitting: Physician Assistant

## 2019-08-22 ENCOUNTER — Ambulatory Visit: Payer: Medicare Other | Admitting: Internal Medicine

## 2019-08-22 DIAGNOSIS — Z0289 Encounter for other administrative examinations: Secondary | ICD-10-CM

## 2019-08-28 ENCOUNTER — Ambulatory Visit: Payer: Medicare Other | Admitting: Physician Assistant

## 2019-08-28 ENCOUNTER — Ambulatory Visit: Payer: Medicare Other | Attending: Internal Medicine

## 2019-08-28 DIAGNOSIS — N2581 Secondary hyperparathyroidism of renal origin: Secondary | ICD-10-CM | POA: Diagnosis not present

## 2019-08-28 DIAGNOSIS — Z23 Encounter for immunization: Secondary | ICD-10-CM

## 2019-08-28 DIAGNOSIS — N189 Chronic kidney disease, unspecified: Secondary | ICD-10-CM | POA: Diagnosis not present

## 2019-08-28 DIAGNOSIS — D631 Anemia in chronic kidney disease: Secondary | ICD-10-CM | POA: Diagnosis not present

## 2019-08-28 DIAGNOSIS — I129 Hypertensive chronic kidney disease with stage 1 through stage 4 chronic kidney disease, or unspecified chronic kidney disease: Secondary | ICD-10-CM | POA: Diagnosis not present

## 2019-08-28 DIAGNOSIS — N183 Chronic kidney disease, stage 3 unspecified: Secondary | ICD-10-CM | POA: Diagnosis not present

## 2019-08-28 DIAGNOSIS — N390 Urinary tract infection, site not specified: Secondary | ICD-10-CM | POA: Diagnosis not present

## 2019-08-28 NOTE — Progress Notes (Signed)
   Covid-19 Vaccination Clinic  Name:  [name]    MRN: [id number] DOB: May 07, 1951  08/28/2019  [name] was observed post Covid-19 immunization for 15 minutes without incident. He was provided with Vaccine Information Sheet and instruction to access the V-Safe system.   [name] was instructed to call 911 with any severe reactions post vaccine: Marland Kitchen Difficulty breathing  . Swelling of face and throat  . A fast heartbeat  . A bad rash all over body  . Dizziness and weakness   Immunizations Administered    Name Date Dose VIS Date Route   Pfizer COVID-19 Vaccine 08/28/2019 11:43 AM 0.3 mL 05/18/2019 Intramuscular   Manufacturer: [place]   Lot: [id number]   [place]: [id number]

## 2019-09-02 ENCOUNTER — Other Ambulatory Visit: Payer: Self-pay | Admitting: Internal Medicine

## 2019-09-02 NOTE — Telephone Encounter (Signed)
Please refill as per office routine med refill policy (all routine meds refilled for 3 mo or monthly per pt preference up to one year from last visit, then month to month grace period for 3 mo, then further med refills will have to be denied)  

## 2019-09-06 ENCOUNTER — Other Ambulatory Visit (HOSPITAL_COMMUNITY): Payer: Self-pay | Admitting: Emergency Medicine

## 2019-09-06 ENCOUNTER — Emergency Department (HOSPITAL_COMMUNITY)
Admit: 2019-09-06 | Discharge: 2019-09-06 | Disposition: A | Discharge status: Home / Self Care | Payer: Medicare Other | Attending: Emergency Medicine | Admitting: Emergency Medicine

## 2019-09-06 ENCOUNTER — Other Ambulatory Visit: Payer: Self-pay

## 2019-09-06 ENCOUNTER — Emergency Department (HOSPITAL_COMMUNITY)
Admission: EM | Admit: 2019-09-06 | Discharge: 2019-09-06 | Disposition: A | Discharge status: Home / Self Care | Payer: Medicare Other | Attending: Emergency Medicine | Admitting: Emergency Medicine

## 2019-09-06 ENCOUNTER — Encounter (HOSPITAL_COMMUNITY): Payer: Self-pay | Admitting: Emergency Medicine

## 2019-09-06 DIAGNOSIS — M79602 Pain in left arm: Secondary | ICD-10-CM | POA: Insufficient documentation

## 2019-09-06 DIAGNOSIS — I129 Hypertensive chronic kidney disease with stage 1 through stage 4 chronic kidney disease, or unspecified chronic kidney disease: Secondary | ICD-10-CM | POA: Insufficient documentation

## 2019-09-06 DIAGNOSIS — Z79899 Other long term (current) drug therapy: Secondary | ICD-10-CM | POA: Insufficient documentation

## 2019-09-06 DIAGNOSIS — N183 Chronic kidney disease, stage 3 unspecified: Secondary | ICD-10-CM | POA: Diagnosis not present

## 2019-09-06 DIAGNOSIS — Z87891 Personal history of nicotine dependence: Secondary | ICD-10-CM | POA: Insufficient documentation

## 2019-09-06 DIAGNOSIS — K439 Ventral hernia without obstruction or gangrene: Secondary | ICD-10-CM | POA: Diagnosis not present

## 2019-09-06 DIAGNOSIS — R609 Edema, unspecified: Secondary | ICD-10-CM

## 2019-09-06 NOTE — Progress Notes (Signed)
Left upper extremity venous duplex has been completed. Preliminary results can be found in CV Proc through chart review.  Results were given to Dr. Lacinda Axon.  09/06/19 2:04 PM [name] RVT

## 2019-09-06 NOTE — ED Triage Notes (Signed)
C/C L upper arm pain, patient got his COVID vaccine last week and since then his arm has been hurting. States pain 10/10 , denies any falls or recent trauma.

## 2019-09-06 NOTE — Discharge Instructions (Addendum)
Ultrasound did not show any blood clot in your arm.  Heating pad, Tylenol, ibuprofen.  See general surgeon for your hernia.

## 2019-09-06 NOTE — ED Provider Notes (Addendum)
[facility] DEPT Provider Note   CSN: [id number] Arrival date & time: 09/06/19  1157     History Chief Complaint  Patient presents with  . Arm Pain    [name] is a 69 y.o. male.  Chief complaint left arm pain after getting a Covid vaccine 1 week ago.  Pain is several inches away from the injection site.  It is painful to flex at the elbow.  Severity is moderate.  Range of motion makes pain worse.        Past Medical History:  Diagnosis Date  . Anemia    normal Fe, nl B12, nl retic, nl EPO July '13  . Blood transfusion without reported diagnosis   . BPH (benign prostatic hyperplasia)   . Chronic back pain   . Chronic kidney disease    CKD III, obstructive nephropathy  . Diverticulosis   . Dysuria   . Elevated PSA, greater than or equal to 20 ng/ml June '13   PSA 107  . Hemorrhoids, internal, with bleeding, prolapse 09/19/2014  . Hyperlipidemia 05/29/2014  . Hypertension   . Obstructive uropathy 11/27/2015  . Renal insufficiency   . Tuberculosis    h/o PPD +  . UTI (urinary tract infection)   . Vitamin D deficiency 09/13/2017    Patient Active Problem List   Diagnosis Date Noted  . Abdominal pain 07/06/2019  . BPH with obstruction/lower urinary tract symptoms 03/09/2019  . Clot retention of urine 02/11/2019  . Vitamin D deficiency 09/13/2017  . Urinary tract infection without hematuria 07/23/2017  . Low back pain 07/03/2017  . Dizziness 06/30/2017  . Acute gouty arthritis 12/01/2016  . Degenerative arthritis of knee, bilateral 11/18/2016  . Urinary retention due to benign prostatic hyperplasia 10/24/2016  . Mass of right side of neck 10/20/2016  . Gout 10/20/2016  . Knee pain, acute 10/12/2016  . Hypokalemia 05/28/2016  . Encounter for well adult exam with abnormal findings 11/27/2015  . Obstructive uropathy 11/27/2015  . Elevated PSA 11/27/2015  . Acute pyelonephritis 05/27/2015  . Generalized bloating 05/27/2015  .  Constipation 05/27/2015  . Chest pain 05/27/2015  . AKI (acute kidney injury) ([place]) 05/27/2015  . Acute sinus infection 11/22/2014  . Cough 09/25/2014  . Hemorrhoids, internal, with bleeding, prolapse 09/19/2014  . 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 Hyperlipidemia 05/29/2014  . Lumbar radiculopathy 05/23/2014  . Lumbar stenosis 11/20/2013  . Abnormal glucose 11/06/2013  . Unspecified hereditary and idiopathic peripheral neuropathy 01/22/2013  . 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 CKD (chronic kidney disease) stage 3, GFR 30-59 ml/min (HCC) 11/24/2011  . Hematuria 11/24/2011  . Hydronephrosis 11/24/2011  . Anemia 11/24/2011  . BRADYCARDIA 02/05/2010  . TINEA PEDIS 01/29/2010  . 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 PERIPHERAL EDEMA 01/29/2010  . ABNORMAL ELECTROCARDIOGRAM 01/29/2010  . 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  . Osteoarthrosis, generalized, 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  . ONYCHOMYCOSIS, TOENAILS 10/02/2007  . BPH (benign prostatic hyperplasia) 10/02/2007  . Essential hypertension 03/06/2007    Past Surgical History:  Procedure Laterality Date  . CARPAL TUNNEL RELEASE Right   . COLONOSCOPY    . HEMORRHOID BANDING    . SPLENECTOMY    . XI ROBOTIC ASSISTED SIMPLE PROSTATECTOMY N/A 03/09/2019   Procedure: XI ROBOTIC ASSISTED SIMPLE PROSTATECTOMY;  Surgeon: Cleon Gustin, MD;  Location: [facility];  Service: Urology;  Laterality: N/A;       Family History  Problem Relation Age of Onset  . Diabetes Mother   .  Stroke Brother   . Hearing loss Brother   . Colon cancer Neg Hx   . Rectal cancer Neg Hx   . Stomach cancer Neg Hx   . Esophageal cancer Neg Hx     Social History   Tobacco Use  . Smoking status: Former Smoker    Types: Cigarettes    Quit date: 02/26/1980    Years since quitting: 39.5  . Smokeless tobacco: Never Used  . Tobacco comment: Quit 1981  Substance Use Topics  . Alcohol use: No    Alcohol/week: 0.0 standard drinks     Comment: Quit 2004  . Drug use: No    Home Medications Prior to Admission medications   Medication Sig Start Date End Date Taking? Authorizing Provider  acetaminophen (TYLENOL) 500 MG tablet Take 1,000 mg by mouth every 6 (six) hours as needed for moderate pain.     [provider]  allopurinol (ZYLOPRIM) 100 MG tablet Take 2 tablets by mouth once daily 08/06/19   Biagio Borg, MD  amLODipine (NORVASC) 5 MG tablet Take 1 tablet (5 mg total) by mouth daily. 04/06/19   Nahser, Wonda Cheng, MD  atorvastatin (LIPITOR) 40 MG tablet TAKE 1 TABLET BY MOUTH ONCE DAILY **  KEEP  FOLLOW  UP  APPOINTMENT  IN  [facility]  FOR  FUTURE  REFILLS** 09/03/19   Biagio Borg, MD  cephALEXin (KEFLEX) 250 MG capsule Take 1 capsule (250 mg total) by mouth 3 (three) times daily. 04/01/19   Charlann Lange, PA-C  colchicine 0.6 MG tablet Take 1 tablet by mouth once daily 04/11/19   Biagio Borg, MD  finasteride (PROSCAR) 5 MG tablet Take 1 tablet by mouth once daily 05/01/19   Biagio Borg, MD  furosemide (LASIX) 20 MG tablet Take 20 mg by mouth daily.    [provider]  HYDROcodone-acetaminophen (NORCO) 5-325 MG tablet Take 1-2 tablets by mouth every 6 (six) hours as needed for moderate pain or severe pain. Patient not taking: Reported on 07/06/2019 03/09/19   Debbrah Alar, PA-C  Hyoscyamine Sulfate SL (LEVSIN/SL) 0.125 MG SUBL Place 0.125 mg under the tongue every 6 (six) hours as needed (bladder spasm). Patient not taking: Reported on 07/06/2019 02/11/19   Irine Seal, MD  metoprolol tartrate (LOPRESSOR) 25 MG tablet Take 1 tablet (25 mg total) by mouth 2 (two) times daily. (BETA BLOCKER) Keep follow=up appt in March for future refills 06/12/19   Biagio Borg, MD  Potassium 99 MG TABS Take 99 mg by mouth daily.     [provider]    Allergies    Patient has no known allergies.  Review of Systems   Review of Systems  All other systems reviewed and are negative.   Physical Exam Updated Vital  Signs BP (!) 161/110 (BP Location: Right Arm)   Pulse 86   Temp 98.2 F (36.8 C) (Oral)   Resp 18   Ht 6\' 1"  (1.854 m)   Wt 117.9 kg   SpO2 99%   BMI 34.30 kg/m   Physical Exam Vitals and nursing note reviewed.  Constitutional:      Appearance: He is well-developed.  HENT:     Head: Normocephalic and atraumatic.  Eyes:     Conjunctiva/sclera: Conjunctivae normal.  Cardiovascular:     Rate and Rhythm: Normal rate and regular rhythm.  Pulmonary:     Effort: Pulmonary effort is normal.  Musculoskeletal:     Cervical back: Neck supple.  Comments: Left upper extremity: Injection site over deltoid examined and appears normal.  Patient is most tender in the mid posterior lateral aspect of the triceps  Skin:    General: Skin is warm and dry.     Comments: Obvious large abdominal ventral hernia  Neurological:     General: No focal deficit present.     Mental Status: He is alert and oriented to person, place, and time.  Psychiatric:        Behavior: Behavior normal.     ED Results / Procedures / Treatments   Labs (all labs ordered are listed, but only abnormal results are displayed) Labs Reviewed - No data to display  EKG None  Radiology UE VENOUS DUPLEX ([facility] 7 am - 7 pm)  Result Date: 09/06/2019 UPPER VENOUS STUDY  Indications: Pain Risk Factors: None identified. Comparison Study: No prior studies. Performing Technologist: Oliver Hum RVT  Examination Guidelines: A complete evaluation includes B-mode imaging, spectral Doppler, color Doppler, and power Doppler as needed of all accessible portions of each vessel. Bilateral testing is considered an integral part of a complete examination. Limited examinations for reoccurring indications may be performed as noted.  Right Findings: +----------+------------+---------+-----------+----------+-------+ RIGHT     CompressiblePhasicitySpontaneousPropertiesSummary  +----------+------------+---------+-----------+----------+-------+ Subclavian    Full       Yes       Yes                      +----------+------------+---------+-----------+----------+-------+  Left Findings: +----------+------------+---------+-----------+----------+-------+ LEFT      CompressiblePhasicitySpontaneousPropertiesSummary +----------+------------+---------+-----------+----------+-------+ IJV           Full       Yes       Yes                      +----------+------------+---------+-----------+----------+-------+ Subclavian    Full       Yes       Yes                      +----------+------------+---------+-----------+----------+-------+ Axillary      Full       Yes       Yes                      +----------+------------+---------+-----------+----------+-------+ Brachial      Full       Yes       Yes                      +----------+------------+---------+-----------+----------+-------+ Radial        Full                                          +----------+------------+---------+-----------+----------+-------+ Ulnar         Full                                          +----------+------------+---------+-----------+----------+-------+ Cephalic      Full                                          +----------+------------+---------+-----------+----------+-------+ Basilic  Full                                          +----------+------------+---------+-----------+----------+-------+  Summary:  Right: No evidence of thrombosis in the subclavian.  Left: No evidence of deep vein thrombosis in the upper extremity. No evidence of superficial vein thrombosis in the upper extremity.  *See table(s) above for measurements and observations.    Preliminary     Procedures Procedures (including critical care time)  Medications Ordered in ED Medications - No data to display  ED Course  I have reviewed the triage vital signs and the  nursing notes.  Pertinent labs & imaging results that were available during my care of the patient were reviewed by me and considered in my medical decision making (see chart for details).    MDM Rules/Calculators/A&P                      We will obtain Doppler study of left upper extremity to rule out DVT.  Patient has already made an appointment with a general surgeon for his ventral hernia.  1415: No DVT identified.  Discussed with patient. Final Clinical Impression(s) / ED Diagnoses Final diagnoses:  Left arm pain  Ventral hernia without obstruction or gangrene    Rx / DC Orders ED Discharge Orders    None       Nat Christen, MD 09/06/19 1325    Nat Christen, MD 09/06/19 [phone]

## 2019-09-06 NOTE — ED Notes (Signed)
US at bedside

## 2019-09-10 DIAGNOSIS — M79602 Pain in left arm: Secondary | ICD-10-CM | POA: Diagnosis not present

## 2019-09-10 DIAGNOSIS — K439 Ventral hernia without obstruction or gangrene: Secondary | ICD-10-CM | POA: Diagnosis not present

## 2019-09-18 ENCOUNTER — Other Ambulatory Visit: Payer: Self-pay

## 2019-09-18 ENCOUNTER — Emergency Department (HOSPITAL_COMMUNITY): Payer: Medicare Other

## 2019-09-18 ENCOUNTER — Encounter (HOSPITAL_COMMUNITY): Payer: Self-pay

## 2019-09-18 ENCOUNTER — Emergency Department (HOSPITAL_COMMUNITY)
Admission: EM | Admit: 2019-09-18 | Discharge: 2019-09-18 | Disposition: A | Discharge status: Home / Self Care | Payer: Medicare Other | Attending: Emergency Medicine | Admitting: Emergency Medicine

## 2019-09-18 DIAGNOSIS — M79631 Pain in right forearm: Secondary | ICD-10-CM | POA: Diagnosis not present

## 2019-09-18 DIAGNOSIS — Z87891 Personal history of nicotine dependence: Secondary | ICD-10-CM | POA: Diagnosis not present

## 2019-09-18 DIAGNOSIS — I129 Hypertensive chronic kidney disease with stage 1 through stage 4 chronic kidney disease, or unspecified chronic kidney disease: Secondary | ICD-10-CM | POA: Diagnosis not present

## 2019-09-18 DIAGNOSIS — R2231 Localized swelling, mass and lump, right upper limb: Secondary | ICD-10-CM | POA: Insufficient documentation

## 2019-09-18 DIAGNOSIS — M79601 Pain in right arm: Secondary | ICD-10-CM | POA: Diagnosis not present

## 2019-09-18 DIAGNOSIS — Z79899 Other long term (current) drug therapy: Secondary | ICD-10-CM | POA: Insufficient documentation

## 2019-09-18 DIAGNOSIS — M7989 Other specified soft tissue disorders: Secondary | ICD-10-CM | POA: Diagnosis not present

## 2019-09-18 DIAGNOSIS — N183 Chronic kidney disease, stage 3 unspecified: Secondary | ICD-10-CM | POA: Diagnosis not present

## 2019-09-18 DIAGNOSIS — M79641 Pain in right hand: Secondary | ICD-10-CM | POA: Diagnosis not present

## 2019-09-18 LAB — COMPREHENSIVE METABOLIC PANEL
ALT: 16 U/L (ref 0–44)
AST: 20 U/L (ref 15–41)
Albumin: 3.8 g/dL (ref 3.5–5.0)
Alkaline Phosphatase: 88 U/L (ref 38–126)
Anion gap: 13 (ref 5–15)
BUN: 29 mg/dL — ABNORMAL HIGH (ref 8–23)
CO2: 28 mmol/L (ref 22–32)
Calcium: 9 mg/dL (ref 8.9–10.3)
Chloride: 102 mmol/L (ref 98–111)
Creatinine, Ser: 1.97 mg/dL — ABNORMAL HIGH (ref 0.61–1.24)
GFR calc Af Amer: 39 mL/min — ABNORMAL LOW (ref >60)
GFR calc non Af Amer: 34 mL/min — ABNORMAL LOW (ref >60)
Glucose, Bld: 119 mg/dL — ABNORMAL HIGH (ref 70–99)
Potassium: 3.4 mmol/L — ABNORMAL LOW (ref 3.5–5.1)
Sodium: 143 mmol/L (ref 135–145)
Total Bilirubin: 1 mg/dL (ref 0.3–1.2)
Total Protein: 7.8 g/dL (ref 6.5–8.1)

## 2019-09-18 LAB — CBC WITH DIFFERENTIAL/PLATELET
Abs Immature Granulocytes: 0.03 10*3/uL (ref 0.00–0.07)
Basophils Absolute: 0.1 10*3/uL (ref 0.0–0.1)
Basophils Relative: 1 %
Eosinophils Absolute: 0 10*3/uL (ref 0.0–0.5)
Eosinophils Relative: 0 %
HCT: 44.7 % (ref 39.0–52.0)
Hemoglobin: 13.8 g/dL (ref 13.0–17.0)
Immature Granulocytes: 0 %
Lymphocytes Relative: 25 %
Lymphs Abs: 2.3 10*3/uL (ref 0.7–4.0)
MCH: 26.7 pg (ref 26.0–34.0)
MCHC: 30.9 g/dL (ref 30.0–36.0)
MCV: 86.5 fL (ref 80.0–100.0)
Monocytes Absolute: 0.7 10*3/uL (ref 0.1–1.0)
Monocytes Relative: 8 %
Neutro Abs: 6.3 10*3/uL (ref 1.7–7.7)
Neutrophils Relative %: 66 %
Platelets: 332 10*3/uL (ref 150–400)
RBC: 5.17 MIL/uL (ref 4.22–5.81)
RDW: 14.9 % (ref 11.5–15.5)
WBC: 9.4 10*3/uL (ref 4.0–10.5)
nRBC: 0 % (ref 0.0–0.2)

## 2019-09-18 LAB — URIC ACID: Uric Acid, Serum: 8.2 mg/dL (ref 3.7–8.6)

## 2019-09-18 LAB — SEDIMENTATION RATE: Sed Rate: 30 mm/hr — ABNORMAL HIGH (ref 0–16)

## 2019-09-18 MED ORDER — PREDNISONE 50 MG PO TABS: ORAL_TABLET | ORAL | 0 refills | Status: DC

## 2019-09-18 MED ORDER — FENTANYL CITRATE (PF) 100 MCG/2ML IJ SOLN
100.0000 ug | Freq: Once | INTRAMUSCULAR | Status: AC
  Administered 2019-09-18: 100 ug via INTRAVENOUS
  Filled 2019-09-18: qty 2

## 2019-09-18 MED ORDER — HYDROCODONE-ACETAMINOPHEN 5-325 MG PO TABS: 1.0000 | ORAL_TABLET | Freq: Four times a day (QID) | ORAL | 0 refills | Status: DC | PRN

## 2019-09-18 MED ORDER — PREDNISONE 20 MG PO TABS
60.0000 mg | ORAL_TABLET | Freq: Once | ORAL | Status: AC
  Administered 2019-09-18: 60 mg via ORAL
  Filled 2019-09-18: qty 3

## 2019-09-18 MED ORDER — CEPHALEXIN 500 MG PO CAPS
500.0000 mg | ORAL_CAPSULE | Freq: Once | ORAL | Status: AC
  Administered 2019-09-18: 500 mg via ORAL
  Filled 2019-09-18: qty 1

## 2019-09-18 MED ORDER — CEPHALEXIN 500 MG PO CAPS: 500.0000 mg | ORAL_CAPSULE | Freq: Two times a day (BID) | ORAL | 0 refills | Status: DC

## 2019-09-18 NOTE — ED Provider Notes (Signed)
**Note [name]-Identified via Obfuscation** [facility] DEPT Provider Note   CSN: [id number] Arrival date & time: 09/18/19  0131     History Chief Complaint  Patient presents with  . Arm Pain    [name] is a 69 y.o. male.  The history is provided by the patient.  Arm Pain This is a new problem. The current episode started more than 2 days ago. The problem occurs daily. The problem has been rapidly worsening. Pertinent negatives include no chest pain and no shortness of breath. Exacerbated by: Movement. Nothing relieves the symptoms.  Patient with history of BPH, chronic kidney disease, hypertension, hyperlipidemia presents after having arm pain for over 3 days.  No trauma.  He reports he is having right arm pain and swelling, mostly over the right hand and right elbow.  He reports he feels that he is having elevated temperature and possible fever. No chest pain or shortness of breath.  He reports he had left arm pain several weeks ago after his COVID-19 vaccine.      Past Medical History:  Diagnosis Date  . Anemia    normal Fe, nl B12, nl retic, nl EPO July '13  . Blood transfusion without reported diagnosis   . BPH (benign prostatic hyperplasia)   . Chronic back pain   . Chronic kidney disease    CKD III, obstructive nephropathy  . Diverticulosis   . Dysuria   . Elevated PSA, greater than or equal to 20 ng/ml June '13   PSA 107  . Hemorrhoids, internal, with bleeding, prolapse 09/19/2014  . Hyperlipidemia 05/29/2014  . Hypertension   . Obstructive uropathy 11/27/2015  . Renal insufficiency   . Tuberculosis    h/o PPD +  . UTI (urinary tract infection)   . Vitamin D deficiency 09/13/2017    Patient Active Problem List   Diagnosis Date Noted  . Abdominal pain 07/06/2019  . BPH with obstruction/lower urinary tract symptoms 03/09/2019  . Clot retention of urine 02/11/2019  . Vitamin D deficiency 09/13/2017  . Urinary tract infection without hematuria 07/23/2017  . Low back  pain 07/03/2017  . Dizziness 06/30/2017  . Acute gouty arthritis 12/01/2016  . Degenerative arthritis of knee, bilateral 11/18/2016  . Urinary retention due to benign prostatic hyperplasia 10/24/2016  . Mass of right side of neck 10/20/2016  . Gout 10/20/2016  . Knee pain, acute 10/12/2016  . Hypokalemia 05/28/2016  . Encounter for well adult exam with abnormal findings 11/27/2015  . Obstructive uropathy 11/27/2015  . Elevated PSA 11/27/2015  . Acute pyelonephritis 05/27/2015  . Generalized bloating 05/27/2015  . Constipation 05/27/2015  . Chest pain 05/27/2015  . AKI (acute kidney injury) ([place]) 05/27/2015  . Acute sinus infection 11/22/2014  . Cough 09/25/2014  . Hemorrhoids, internal, with bleeding, prolapse 09/19/2014  . 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 Hyperlipidemia 05/29/2014  . Lumbar radiculopathy 05/23/2014  . Lumbar stenosis 11/20/2013  . Abnormal glucose 11/06/2013  . Unspecified hereditary and idiopathic peripheral neuropathy 01/22/2013  . 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 CKD (chronic kidney disease) stage 3, GFR 30-59 ml/min (HCC) 11/24/2011  . Hematuria 11/24/2011  . Hydronephrosis 11/24/2011  . Anemia 11/24/2011  . BRADYCARDIA 02/05/2010  . TINEA PEDIS 01/29/2010  . 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 PERIPHERAL EDEMA 01/29/2010  . ABNORMAL ELECTROCARDIOGRAM 01/29/2010  . 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  . Osteoarthrosis, generalized, 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  . ONYCHOMYCOSIS, TOENAILS 10/02/2007  . BPH (benign prostatic hyperplasia) 10/02/2007  . Essential hypertension 03/06/2007  Past Surgical History:  Procedure Laterality Date  . CARPAL TUNNEL RELEASE Right   . COLONOSCOPY    . HEMORRHOID BANDING    . SPLENECTOMY    . XI ROBOTIC ASSISTED SIMPLE PROSTATECTOMY N/A 03/09/2019   Procedure: XI ROBOTIC ASSISTED SIMPLE PROSTATECTOMY;  Surgeon: Cleon Gustin, MD;  Location: [facility];  Service: Urology;  Laterality: N/A;        Family History  Problem Relation Age of Onset  . Diabetes Mother   . Stroke Brother   . Hearing loss Brother   . Colon cancer Neg Hx   . Rectal cancer Neg Hx   . Stomach cancer Neg Hx   . Esophageal cancer Neg Hx     Social History   Tobacco Use  . Smoking status: Former Smoker    Types: Cigarettes    Quit date: 02/26/1980    Years since quitting: 39.5  . Smokeless tobacco: Never Used  . Tobacco comment: Quit 1981  Substance Use Topics  . Alcohol use: No    Alcohol/week: 0.0 standard drinks    Comment: Quit 2004  . Drug use: No    Home Medications Prior to Admission medications   Medication Sig Start Date End Date Taking? Authorizing Provider  acetaminophen (TYLENOL) 500 MG tablet Take 1,000 mg by mouth every 6 (six) hours as needed for moderate pain.     [provider]  allopurinol (ZYLOPRIM) 100 MG tablet Take 2 tablets by mouth once daily 08/06/19   Donald Borg, MD  amLODipine (NORVASC) 5 MG tablet Take 1 tablet (5 mg total) by mouth daily. 04/06/19   Nahser, Wonda Cheng, MD  atorvastatin (LIPITOR) 40 MG tablet TAKE 1 TABLET BY MOUTH ONCE DAILY **  KEEP  FOLLOW  UP  APPOINTMENT  IN  [facility]  FOR  FUTURE  REFILLS** 09/03/19   Donald Borg, MD  colchicine 0.6 MG tablet Take 1 tablet by mouth once daily 04/11/19   Donald Borg, MD  finasteride (PROSCAR) 5 MG tablet Take 1 tablet by mouth once daily 05/01/19   Donald Borg, MD  furosemide (LASIX) 20 MG tablet Take 20 mg by mouth daily.    [provider]  metoprolol tartrate (LOPRESSOR) 25 MG tablet Take 1 tablet (25 mg total) by mouth 2 (two) times daily. (BETA BLOCKER) Keep follow=up appt in March for future refills 06/12/19   Donald Borg, MD  Potassium 99 MG TABS Take 99 mg by mouth daily.     [provider]  Hyoscyamine Sulfate SL (LEVSIN/SL) 0.125 MG SUBL Place 0.125 mg under the tongue every 6 (six) hours as needed (bladder spasm). Patient not taking: Reported on 07/06/2019 02/11/19 09/18/19   Donald Seal, MD    Allergies    Patient has no known allergies.  Review of Systems   Review of Systems  Constitutional: Positive for chills and fever.  Respiratory: Negative for shortness of breath.   Cardiovascular: Negative for chest pain.  Gastrointestinal: Negative for vomiting.  Musculoskeletal: Positive for arthralgias and joint swelling.  All other systems reviewed and are negative.   Physical Exam Updated Vital Signs BP (!) 151/120 (BP Location: Left Arm)   Pulse 94   Temp 99.9 F (37.7 C) (Oral)   Resp 17   Ht 1.854 m (6\' 1" )   Wt 117.9 kg   SpO2 95%   BMI 34.30 kg/m   Physical Exam CONSTITUTIONAL: Well developed/well nourished HEAD: Normocephalic/atraumatic EYES: EOMI ENMT: Mucous membranes moist NECK: supple  no meningeal signs SPINE/BACK:entire spine nontender CV: S1/S2 noted, no murmurs/rubs/gallops noted LUNGS: Lungs are clear to auscultation bilaterally, no apparent distress ABDOMEN: soft, nontender NEURO: Pt is awake/alert/appropriate, moves all extremitiesx4.  No facial droop.  EXTREMITIES: pulses normal/equal, there is exquisite tenderness noted over the right elbow with erythema noted.  There is also exquisite tenderness noted to the right hand with edema and erythema.  There is mild tenderness in the right forearm with minimal edema.  Distal pulses intact.  No tenderness noted to the right biceps or shoulder.  He is able to move the fingers on his right hand without difficulty No crepitus to the elbow/forearm/right hand SKIN: warm, color normal PSYCH: no abnormalities of mood noted, alert and oriented to situation      ED Results / Procedures / Treatments   Labs (all labs ordered are listed, but only abnormal results are displayed) Labs Reviewed  COMPREHENSIVE METABOLIC PANEL - Abnormal; Notable for the following components:      Result Value   Potassium 3.4 (*)    Glucose, Bld 119 (*)    BUN 29 (*)    Creatinine, Ser 1.97 (*)    GFR calc  non Af Amer 34 (*)    GFR calc Af Amer 39 (*)    All other components within normal limits  SEDIMENTATION RATE - Abnormal; Notable for the following components:   Sed Rate 30 (*)    All other components within normal limits  CBC WITH DIFFERENTIAL/PLATELET  URIC ACID    EKG None  Radiology DG Forearm Right  Result Date: 09/18/2019 CLINICAL DATA:  69 year old male with pain and swelling in the right forearm. EXAM: RIGHT FOREARM - 2 VIEW COMPARISON:  No priors. FINDINGS: There is no evidence of fracture or other focal bone lesions. Soft tissues are unremarkable. IMPRESSION: Negative. Electronically Signed   By: Vinnie Langton M.D.   On: 09/18/2019 05:09   DG Hand Complete Right  Result Date: 09/18/2019 CLINICAL DATA:  Right hand pain. EXAM: RIGHT HAND - COMPLETE 3+ VIEW COMPARISON:  No recent prior. FINDINGS: Diffuse soft tissue swelling. No radiopaque foreign body. No acute bony or joint abnormality identified. No evidence of fracture dislocation. IMPRESSION: Diffuse soft tissue swelling. No radiopaque foreign body. No acute bony abnormality. Electronically Signed   By: Marcello Moores  Register   On: 09/18/2019 05:11    Procedures Procedures   Medications Ordered in ED Medications  fentaNYL (SUBLIMAZE) injection 100 mcg (100 mcg Intravenous Given 09/18/19 0555)  fentaNYL (SUBLIMAZE) injection 100 mcg (100 mcg Intravenous Given 09/18/19 0646)  predniSONE (DELTASONE) tablet 60 mg (60 mg Oral Given 09/18/19 0737)  cephALEXin (KEFLEX) capsule 500 mg (500 mg Oral Given 09/18/19 0737)    ED Course  I have reviewed the triage vital signs and the nursing notes.  Pertinent labs & imaging results that were available during my care of the patient were reviewed by me and considered in my medical decision making (see chart for details).    MDM Rules/Calculators/A&P                       4:58 AM Patient presents with atraumatic right elbow and right hand pain.  He has exquisite tenderness in those  joints.  He has significant edema to the right hand. Labs and imaging are pending at this time   This patient presents to the ED for concern of right arm pain and swelling, this involves an extensive number of treatment options, and  is a complaint that carries with it a high risk of complications and morbidity.  The differential diagnosis includes DVT, cellulitis, gouty arthritis, septic arthritis   Lab Tests:   I Ordered, reviewed, and interpreted labs, which included complete metabolic panel, complete blood count  Medicines ordered:   I ordered medication fentanyl for pain  Imaging Studies ordered:   I ordered imaging studies which included right forearm and right hand  I independently visualized and interpreted imaging which showed no acute findings  Additional history obtained:     Previous records obtained and reviewed history of inflammatory throat as before   Reevaluation:  After the interventions stated above, I reevaluated the patient and found patient is improved and ready for discharge  7:45 AM Patient presented with isolated right elbow and right hand pain.  No signs of olecranon bursitis.  His right elbow pain improved, but still has significant swelling and pain to right hand.  He is able to range the right wrist.  Patient has a history of gout and inflammatory arthritis before.  Labs are not convincing for an infectious etiology.  Due to his underlying kidney disease, will avoid NSAIDs.  Will start on prednisone.  Just in case there might be a cellulitic component, patient will also be placed on Keflex. There is no focal abscess, no crepitus noted to his hand.  Fortunately patient has follow-up with a sports medicine specialist tomorrow. I advised patient to keep arm elevated, and if any worsening pain, swelling, redness to the arm he should return to ER immediately. I feel this is less likely a DVT.   Final Clinical Impression(s) / ED Diagnoses Final  diagnoses:  Right arm pain  Swelling of right hand    Rx / DC Orders ED Discharge Orders         Ordered    predniSONE (DELTASONE) 50 MG tablet     09/18/19 0722    cephALEXin (KEFLEX) 500 MG capsule  2 times daily     09/18/19 0722    HYDROcodone-acetaminophen (NORCO/VICODIN) 5-325 MG tablet  Every 6 hours PRN     09/18/19 0739           Ripley Fraise, MD 09/18/19 [phone]

## 2019-09-18 NOTE — ED Triage Notes (Signed)
Patient arrived stating 3 days ago he started having right arm pain and swelling. Denies any injury. Pulses present and no difficulty moving extremity

## 2019-09-18 NOTE — Discharge Instructions (Signed)
Keep your arm elevated.  Take the medications as we discussed.  Follow-up with your specialist this week.  If you have worsening pain, redness, swelling to the arm please return to the ER

## 2019-09-19 ENCOUNTER — Ambulatory Visit: Payer: Medicare Other | Admitting: Family Medicine

## 2019-09-19 ENCOUNTER — Ambulatory Visit (INDEPENDENT_AMBULATORY_CARE_PROVIDER_SITE_OTHER): Payer: Medicare Other

## 2019-09-19 ENCOUNTER — Encounter: Payer: Self-pay | Admitting: Family Medicine

## 2019-09-19 VITALS — BP 132/88 | HR 61 | Ht 73.0 in | Wt 272.0 lb

## 2019-09-19 DIAGNOSIS — M79601 Pain in right arm: Secondary | ICD-10-CM

## 2019-09-19 DIAGNOSIS — M170 Bilateral primary osteoarthritis of knee: Secondary | ICD-10-CM | POA: Diagnosis not present

## 2019-09-19 DIAGNOSIS — M109 Gout, unspecified: Secondary | ICD-10-CM | POA: Diagnosis not present

## 2019-09-19 NOTE — Assessment & Plan Note (Signed)
Right knee injection given today.  Tolerated the procedure well, discussed which activities to do which wants to avoid.  Increase activity slowly.  Follow-up again in 8 weeks.

## 2019-09-19 NOTE — Progress Notes (Signed)
[place] [facility] [facility] Phone: [phone] Subjective:   [name], am serving as a scribe for Dr. Hulan Saas. This visit occurred during the SARS-CoV-2 public health emergency.  Safety protocols were in place, including screening questions prior to the visit, additional usage of staff PPE, and extensive cleaning of exam room while observing appropriate contact time as indicated for disinfecting solutions.   I'm seeing this patient by the request  of:  Biagio Borg, MD  CC: Right arm swelling  [id number]  [name] is a 69 y.o. male coming in with complaint of right arm pain. Seen in ED yesterday. Seen in ED for right arm pain from vaccine on 09/06/2019. Three weeks ago patient started to have right hand swelling after having 2nd vaccination. Feels a burning sensation in the hand.  Right hand was swollen at first. Injection given in left arm.       Past Medical History:  Diagnosis Date  . Anemia    normal Fe, nl B12, nl retic, nl EPO July '13  . Blood transfusion without reported diagnosis   . BPH (benign prostatic hyperplasia)   . Chronic back pain   . Chronic kidney disease    CKD III, obstructive nephropathy  . Diverticulosis   . Dysuria   . Elevated PSA, greater than or equal to 20 ng/ml June '13   PSA 107  . Hemorrhoids, internal, with bleeding, prolapse 09/19/2014  . Hyperlipidemia 05/29/2014  . Hypertension   . Obstructive uropathy 11/27/2015  . Renal insufficiency   . Tuberculosis    h/o PPD +  . UTI (urinary tract infection)   . Vitamin D deficiency 09/13/2017   Past Surgical History:  Procedure Laterality Date  . CARPAL TUNNEL RELEASE Right   . COLONOSCOPY    . HEMORRHOID BANDING    . SPLENECTOMY    . XI ROBOTIC ASSISTED SIMPLE PROSTATECTOMY N/A 03/09/2019   Procedure: XI ROBOTIC ASSISTED SIMPLE PROSTATECTOMY;  Surgeon: Cleon Gustin, MD;  Location: [facility];  Service: Urology;   Laterality: N/A;   Social History   Socioeconomic History  . Marital status: Single    Spouse name: Not on file  . Number of children: 2  . Years of education: 55  . Highest education level: Some college, no degree  Occupational History  . Occupation: Lobbyist: [employer]    Comment: unemployed  Tobacco Use  . Smoking status: Former Smoker    Types: Cigarettes    Quit date: 02/26/1980    Years since quitting: 39.5  . Smokeless tobacco: Never Used  . Tobacco comment: Quit 1981  Substance and Sexual Activity  . Alcohol use: No    Alcohol/week: 0.0 standard drinks    Comment: Quit 2004  . Drug use: No  . Sexual activity: Never  Other Topics Concern  . Not on file  Social History Narrative   Native of [place], raised poor farming community. . To [place] '78 - finished College, all but Arts administrator. Married - wife in [place], blocked from [place] to [place]. 1 son '90; 1 dtr '93. 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 Resources depleted.   Right handed.   Caffeine Hot daily one daily.   Social Determinants of Health   Financial Resource Strain: Medium Risk  . Difficulty of Paying Living Expenses: Somewhat hard  Food Insecurity: No Food Insecurity  . Worried About Estate manager/land agent  of Food in the Last Year: Never true  . Ran Out of Food in the Last Year: Never true  Transportation Needs: Unmet Transportation Needs  . Lack of Transportation (Medical): Yes  . Lack of Transportation (Non-Medical): Yes  Physical Activity: Insufficiently Active  . Days of Exercise per Week: 3 days  . Minutes of Exercise per Session: 10 min  Stress: No Stress Concern Present  . Feeling of Stress : Only a little  Social Connections: Not Isolated  . Frequency of Communication with Friends and Family: Three times a week  . Frequency of Social Gatherings with Friends and Family: Once a week  . Attends Religious Services: 1 to 4 times per year  . Active  Member of Clubs or Organizations: Yes  . Attends Archivist Meetings: 1 to 4 times per year  . Marital Status: Married   No Known Allergies Family History  Problem Relation Age of Onset  . Diabetes Mother   . Stroke Brother   . Hearing loss Brother   . Colon cancer Neg Hx   . Rectal cancer Neg Hx   . Stomach cancer Neg Hx   . Esophageal cancer Neg Hx     Current Outpatient Medications (Endocrine & Metabolic):  .  predniSONE (DELTASONE) 50 MG tablet, 1 tablet PO QD X4 days  Current Outpatient Medications (Cardiovascular):  .  amLODipine (NORVASC) 5 MG tablet, Take 1 tablet (5 mg total) by mouth daily. Marland Kitchen  atorvastatin (LIPITOR) 40 MG tablet, TAKE 1 TABLET BY MOUTH ONCE DAILY **  KEEP  FOLLOW  UP  APPOINTMENT  IN  [facility]  FOR  FUTURE  REFILLS** (Patient taking differently: Take 40 mg by mouth daily. ) .  metoprolol tartrate (LOPRESSOR) 25 MG tablet, Take 1 tablet (25 mg total) by mouth 2 (two) times daily. (BETA BLOCKER) Keep follow=up appt in March for future refills   Current Outpatient Medications (Analgesics):  .  acetaminophen (TYLENOL) 500 MG tablet, Take 1,000 mg by mouth every 6 (six) hours as needed for moderate pain.  Marland Kitchen  allopurinol (ZYLOPRIM) 100 MG tablet, Take 2 tablets by mouth once daily (Patient taking differently: Take 200 mg by mouth daily. ) .  aspirin EC 81 MG tablet, Take 81 mg by mouth daily. .  colchicine 0.6 MG tablet, Take 1 tablet by mouth once daily (Patient taking differently: Take 0.6 mg by mouth daily. ) .  HYDROcodone-acetaminophen (NORCO/VICODIN) 5-325 MG tablet, Take 1 tablet by mouth every 6 (six) hours as needed for severe pain.  Current Outpatient Medications (Hematological):  .  ferrous sulfate 325 (65 FE) MG tablet, Take 325 mg by mouth daily with breakfast.  Current Outpatient Medications (Other):  .  cephALEXin (KEFLEX) 500 MG capsule, Take 1 capsule (500 mg total) by mouth 2 (two) times daily. 2 caps po bid x 7 days .   cholecalciferol (VITAMIN D3) 25 MCG (1000 UNIT) tablet, Take 1,000 Units by mouth daily. .  finasteride (PROSCAR) 5 MG tablet, Take 1 tablet by mouth once daily .  Potassium 99 MG TABS, Take 99 mg by mouth daily.    Reviewed prior external information including notes and imaging from  primary care provider As well as notes that were available from care everywhere and other healthcare systems.  Past medical history, social, surgical and family history all reviewed in electronic medical record.  No pertanent information unless stated regarding to the chief complaint.   Review of Systems:  No headache, visual changes, nausea, vomiting, diarrhea,  constipation, dizziness, abdominal pain, skin rash, fevers, chills, night sweats, weight loss, swollen lymph nodes, body aches, chest pain, shortness of breath, mood changes. POSITIVE muscle aches, joint swelling  Objective  Blood pressure 132/88, pulse 61, height 6\' 1"  (1.854 m), weight 272 lb (123.4 kg), SpO2 97 %.   General: No apparent distress alert and oriented x3 mood and affect normal, dressed appropriately.  HEENT: Pupils equal, extraocular movements intact  Respiratory: Patient's speak in full sentences and does not appear short of breath  Cardiovascular: 2+ lower extremity edema, non tender, no erythema  Neuro: Cranial nerves II through XII are intact, neurovascularly intact in all extremities with 2+ DTRs and 2+ pulses.  Gait antalgic using a cane Right upper extremity so the patient does have significant amount of swelling noted of the upper extremity on the right side.  No streaking noted.  Swelling seems to be in the hand as well as the olecranon bursa.  Mild warmth to touch in both areas.  Patient does have full range of motion of the fingers noted.  Neurovascular intact distally.  Patient does have swelling noted of the olecranon bursa on the right side as well.  Limited musculoskeletal ultrasound was performed and interpreted by  Lyndal Pulley  Limited ultrasound of patient's hand shows that patient does have some soft tissue swelling noted but no true areas that I would consider more cellulitic in nature.  Some mild gouty deposits of the wrist:  Joints noted.  Patient also has what it seems to be gouty deposits in the olecranon bursa.  No true fluid accumulation that would allow for aspiration today.   Impression and Recommendations:     This case required medical decision making of moderate complexity. The above documentation has been reviewed and is accurate and complete Lyndal Pulley, DO       Note: This dictation was prepared with Dragon dictation along with smaller phrase technology. Any transcriptional errors that result from this process are unintentional.

## 2019-09-19 NOTE — Assessment & Plan Note (Signed)
It does appear that patient swelling is likely secondary to more of a gouty flare.  Do not know if this is secondary to the vaccine versus just a coincidence.  I do not see any signs of true infectious etiology noted today but I encourage patient to continue the Keflex.  If any worsening symptoms I would expand to doxycycline for better MRSA coverage but I am hoping that this is not necessary.  Seems to be responding well to the prednisone and can.  Continue.  Avoid oral anti-inflammatory secondary to the chronic kidney injury that he has had previously.  Also has had significant infections of the kidneys previously.  Laboratory work-up otherwise was fairly unremarkable from the emergency department.  Follow-up with me again in 2 to 4 weeks

## 2019-09-19 NOTE — Patient Instructions (Signed)
Good to see you finish the antibiotic Did a knee injection today and I hope it helps Call us Monday if not a lot better and we will expand the antibiotic to doxycycline.  See me again in 4-5 weeks otherwise

## 2019-09-20 ENCOUNTER — Other Ambulatory Visit: Payer: Self-pay | Admitting: *Deleted

## 2019-09-20 NOTE — Patient Outreach (Signed)
[facility]) Care Management  09/20/2019  [name] December 03, 1950 [id number]   Have received notice that pt has had 2 ED visits this month. First was for L arm pain after COVID vaccine and Second was for R arm and hand pain with dx gouty arthritis flare.  He reports his L arm pain readily resolved. He states the R arm pain and swelling are subsiding. He says he has been taking his alupurinol routinely. He had run out of colchicine and does have this now. He has been taking prednisone.  Reviewed his hospital and ED visits for the last 12 months and we discussed that some of these ED visit problems could have been avoided if he had notified Dr. Jenny Reichmann or myself at first symptoms. Advised that the ED should only be accessed if there is a very serious problem that could be life threatening.  Pt acknowledged this information. Reaffirmed with pt that he should call Dr. Jenny Reichmann or myself early for problems to avoid going to the ED.  We agreed it would be a good idea for me to check in with him next week after his consult with GI surgeon regarding his hernia.   [name] Myrtie Neither, MSN, [facility] Gerontological Nurse Practitioner [facility] Care Management [phone]

## 2019-09-24 ENCOUNTER — Ambulatory Visit: Payer: Self-pay | Admitting: *Deleted

## 2019-09-26 ENCOUNTER — Telehealth: Payer: Self-pay | Admitting: *Deleted

## 2019-09-26 DIAGNOSIS — K432 Incisional hernia without obstruction or gangrene: Secondary | ICD-10-CM | POA: Diagnosis not present

## 2019-09-26 MED ORDER — DOXYCYCLINE HYCLATE 100 MG PO TABS: 100.0000 mg | ORAL_TABLET | Freq: Two times a day (BID) | ORAL | 0 refills | Status: AC

## 2019-09-26 NOTE — Telephone Encounter (Signed)
Sent in doxy to continue then for another week.

## 2019-09-26 NOTE — Telephone Encounter (Signed)
Pt called stating that he saw Dr. Tamala Julian on 4/14 & was told to call back if his arm was not any better after taking an abx. Pt stated that it is a little better but there still is some redness/swelling.

## 2019-09-26 NOTE — Telephone Encounter (Signed)
Spoke with patient about rx refill.

## 2019-09-27 ENCOUNTER — Other Ambulatory Visit: Payer: Self-pay | Admitting: *Deleted

## 2019-09-27 NOTE — Patient Outreach (Signed)
[facility]) Care Management  09/27/2019  [name] February 18, 1951 [id number]   Telephone outreach to follow up on pt hernia evaluation and gouty arthritis of his hand.  Pt reports he did see the surgeon and he could not reduce his hernia. The plan is for him to review prior xrays and scans and then follow up with pt.  Pt reports his hand is much less swollen but not completely resolved. He has seen the orthopedist for follow up also and has been prescribed an additional week of doxycyline.  He denies any needs and does not have any further questions at this time.  Request that pt update me if he has to have surgery so that I may follow up with him post operatively. He assures me that he will  Reiterated need to call me or his doctors with any sxs before having to go to the ED. He says he will.  We agreed I will call again in 3 months.  [name] Myrtie Neither, MSN, [facility] Gerontological Nurse Practitioner [facility] Care Management [phone]

## 2019-10-02 ENCOUNTER — Ambulatory Visit: Payer: Self-pay | Admitting: General Surgery

## 2019-10-02 NOTE — H&P (View-Only) (Signed)
History of Present Illness Donald Ok MD; 09/26/2019 10:52 AM) The patient is a 69 year old male who presents with an umbilical hernia. Patient is a 69 year old male who comes in for chief complaint of incisional hernia. Patient is referred by Dr. Noah Delaine to urology. Patient recently underwent robotic prostatectomy in October 2020. 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 This was approximately 20 years ago. Patient states that he recently noticed bulging after his prostate surgery. He states prior to that he has not had any abdominal bulging. Patient states he underwent a CT scan at the urology clinic. He states he's having some issues with constipation. He does have some discomfort and pain to the area.      Past Surgical History [facility] Teressa Senter, [place]; 09/26/2019 10:33 AM) Hemorrhoidectomy  Splenectomy   Allergies (Chanel Teressa Senter, CMA; 09/26/2019 10:33 AM) No Known Allergies  [09/26/2019]: No Known Drug Allergies  [09/26/2019]: Allergies Reconciled   Medication History (Chanel Teressa Senter, CMA; 09/26/2019 10:35 AM) Aspirin (81MG  Tablet Chewable, Oral) Active. Cholecalciferol (25 MCG(1000 UT) Tablet Chewable, Oral) Active. Ferrous Sulfate (325 (65 Fe)MG Tablet, Oral) Active. Potassium (99MG  Tablet, Oral) Active. Cephalexin (500MG  Capsule, Oral) Active. HYDROcodone-Acetaminophen (5-325MG  Tablet, Oral) Active. Atorvastatin Calcium (40MG  Tablet, Oral) Active. Allopurinol (100MG  Tablet, Oral) Active. Metoprolol Tartrate (25MG  Tablet, Oral) Active. Colchicine (0.6MG  Tablet, Oral) Active. amLODIPine Besylate (5MG  Tablet, Oral) Active. Medications Reconciled  Family History Antonietta Jewel, [place]; 09/26/2019 10:33 AM) Family history unknown  First Degree Relatives   Other Problems Antonietta Jewel, [place]; 09/26/2019 10:33 AM) Arthritis  High blood pressure  Hypercholesterolemia     Review of Systems Donald Ok MD;  09/26/2019 10:51 AM) General Present- Weight Gain. Not Present- Appetite Loss, Chills, Fatigue, Fever, Night Sweats and Weight Loss. HEENT Present- Ringing in the Ears. Not Present- Earache, Hearing Loss, Hoarseness, Nose Bleed, Oral Ulcers, Seasonal Allergies, Sinus Pain, Sore Throat, Visual Disturbances, Wears glasses/contact lenses and Yellow Eyes. Respiratory Not Present- Bloody sputum, Chronic Cough, Difficulty Breathing, Snoring and Wheezing. Breast Present- Nipple Discharge. Not Present- Breast Mass, Breast Pain and Skin Changes. Cardiovascular Present- Swelling of Extremities. Not Present- Chest Pain, Difficulty Breathing Lying Down, Leg Cramps, Palpitations, Rapid Heart Rate and Shortness of Breath. Gastrointestinal Present- Change in Bowel Habits. Not Present- Abdominal Pain, Bloating, Bloody Stool, Chronic diarrhea, Constipation, Difficulty Swallowing, Excessive gas, Gets full quickly at meals, Hemorrhoids, Indigestion, Nausea, Rectal Pain and Vomiting. Male Genitourinary Present- Urgency. Not Present- Blood in Urine, Change in Urinary Stream, Frequency, Impotence, Nocturia, Painful Urination and Urine Leakage. Neurological Present- Headaches, Seizures, Tremor and Weakness. Not Present- Decreased Memory, Fainting, Numbness, Tingling and Trouble walking. Psychiatric Not Present- Anxiety, Bipolar, Change in Sleep Pattern, Depression, Fearful and Frequent crying. Endocrine Present- Heat Intolerance. Not Present- Cold Intolerance, Excessive Hunger, Hair Changes, Hot flashes and New Diabetes. Hematology Not Present- Blood Thinners, Easy Bruising, Excessive bleeding, Gland problems, HIV and Persistent Infections. All other systems negative  Vitals (Chanel Nolan CMA; 09/26/2019 10:36 AM) 09/26/2019 10:35 AM Weight: 273 lb Height: 70in Body Surface Area: 2.38 m Body Mass Index: 39.17 kg/m  Temp.: 97.57F  Pulse: 85 (Regular)  BP: 126/76(Sitting, Left Arm,  Standard)       Physical Exam Donald Ok MD; 09/26/2019 10:53 AM) The physical exam findings are as follows: Note: Constitutional: No acute distress, conversant, appears stated age  Eyes: Anicteric sclerae, moist conjunctiva, no lid lag  Neck: No thyromegaly, trachea midline, no cervical lymphadenopathy  Lungs: Clear to auscultation biilaterally, normal respiratory effot  Cardiovascular: regular  rate & rhythm, no murmurs, no peripheal edema, pedal pulses 2+  GI: Soft, no masses or hepatosplenomegaly, non-tender to palpation  MSK: Normal gait, no clubbing cyanosis, edema  Skin: No rashes, palpation reveals normal skin turgor  Psychiatric: Appropriate judgment and insight, oriented to person, place, and time  Abdomen Inspection Hernias - Incisional - Reducible (Large midline laparotomy incision, small extractions had a prostatectomy hernia, this appears to be reducible. Patient also has a component of rectus diastases.) .    Assessment & Plan Donald Ok MD; 09/26/2019 10:54 AM) [name] HERNIA, WITHOUT OBSTRUCTION OR GANGRENE (K43.2) Impression: Patient is a 69 year old male with a history of hypertension, hyperlipidemia, gout, who comes in status post prostatectomy. Patient appears to have an incisional hernia at the extraction site. We'll proceed to the operating for robotic incisional hernia repair with mesh.  I did discuss with him the risks and benefits of the procedure to include but not limited to: Infection, bleeding, damage to structures, possible recurrence.  Patient was understanding and wishes to proceed.

## 2019-10-02 NOTE — H&P (Addendum)
History of Present Illness Donald Ok MD; 09/26/2019 10:52 AM) The patient is a 69 year old male who presents with an umbilical hernia. Patient is a 69 year old male who comes in for chief complaint of incisional hernia. Patient is referred by Dr. Noah Delaine to urology. Patient recently underwent robotic prostatectomy in October 2020. 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 This was approximately 20 years ago. Patient states that he recently noticed bulging after his prostate surgery. He states prior to that he has not had any abdominal bulging. Patient states he underwent a CT scan at the urology clinic. He states he's having some issues with constipation. He does have some discomfort and pain to the area.      Past Surgical History [facility] Teressa Senter, [place]; 09/26/2019 10:33 AM) Hemorrhoidectomy  Splenectomy   Allergies (Chanel Teressa Senter, CMA; 09/26/2019 10:33 AM) No Known Allergies  [09/26/2019]: No Known Drug Allergies  [09/26/2019]: Allergies Reconciled   Medication History (Chanel Teressa Senter, CMA; 09/26/2019 10:35 AM) Aspirin (81MG  Tablet Chewable, Oral) Active. Cholecalciferol (25 MCG(1000 UT) Tablet Chewable, Oral) Active. Ferrous Sulfate (325 (65 Fe)MG Tablet, Oral) Active. Potassium (99MG  Tablet, Oral) Active. Cephalexin (500MG  Capsule, Oral) Active. HYDROcodone-Acetaminophen (5-325MG  Tablet, Oral) Active. Atorvastatin Calcium (40MG  Tablet, Oral) Active. Allopurinol (100MG  Tablet, Oral) Active. Metoprolol Tartrate (25MG  Tablet, Oral) Active. Colchicine (0.6MG  Tablet, Oral) Active. amLODIPine Besylate (5MG  Tablet, Oral) Active. Medications Reconciled  Family History Antonietta Jewel, [place]; 09/26/2019 10:33 AM) Family history unknown  First Degree Relatives   Other Problems Antonietta Jewel, [place]; 09/26/2019 10:33 AM) Arthritis  High blood pressure  Hypercholesterolemia     Review of Systems Donald Ok MD;  09/26/2019 10:51 AM) General Present- Weight Gain. Not Present- Appetite Loss, Chills, Fatigue, Fever, Night Sweats and Weight Loss. HEENT Present- Ringing in the Ears. Not Present- Earache, Hearing Loss, Hoarseness, Nose Bleed, Oral Ulcers, Seasonal Allergies, Sinus Pain, Sore Throat, Visual Disturbances, Wears glasses/contact lenses and Yellow Eyes. Respiratory Not Present- Bloody sputum, Chronic Cough, Difficulty Breathing, Snoring and Wheezing. Breast Present- Nipple Discharge. Not Present- Breast Mass, Breast Pain and Skin Changes. Cardiovascular Present- Swelling of Extremities. Not Present- Chest Pain, Difficulty Breathing Lying Down, Leg Cramps, Palpitations, Rapid Heart Rate and Shortness of Breath. Gastrointestinal Present- Change in Bowel Habits. Not Present- Abdominal Pain, Bloating, Bloody Stool, Chronic diarrhea, Constipation, Difficulty Swallowing, Excessive gas, Gets full quickly at meals, Hemorrhoids, Indigestion, Nausea, Rectal Pain and Vomiting. Male Genitourinary Present- Urgency. Not Present- Blood in Urine, Change in Urinary Stream, Frequency, Impotence, Nocturia, Painful Urination and Urine Leakage. Neurological Present- Headaches, Seizures, Tremor and Weakness. Not Present- Decreased Memory, Fainting, Numbness, Tingling and Trouble walking. Psychiatric Not Present- Anxiety, Bipolar, Change in Sleep Pattern, Depression, Fearful and Frequent crying. Endocrine Present- Heat Intolerance. Not Present- Cold Intolerance, Excessive Hunger, Hair Changes, Hot flashes and New Diabetes. Hematology Not Present- Blood Thinners, Easy Bruising, Excessive bleeding, Gland problems, HIV and Persistent Infections. All other systems negative  Vitals (Chanel Nolan CMA; 09/26/2019 10:36 AM) 09/26/2019 10:35 AM Weight: 273 lb Height: 70in Body Surface Area: 2.38 m Body Mass Index: 39.17 kg/m  Temp.: 97.21F  Pulse: 85 (Regular)  BP: 126/76(Sitting, Left Arm,  Standard)       Physical Exam Donald Ok MD; 09/26/2019 10:53 AM) The physical exam findings are as follows: Note: Constitutional: No acute distress, conversant, appears stated age  Eyes: Anicteric sclerae, moist conjunctiva, no lid lag  Neck: No thyromegaly, trachea midline, no cervical lymphadenopathy  Lungs: Clear to auscultation biilaterally, normal respiratory effot  Cardiovascular: regular  rate & rhythm, no murmurs, no peripheal edema, pedal pulses 2+  GI: Soft, no masses or hepatosplenomegaly, non-tender to palpation  MSK: Normal gait, no clubbing cyanosis, edema  Skin: No rashes, palpation reveals normal skin turgor  Psychiatric: Appropriate judgment and insight, oriented to person, place, and time  Abdomen Inspection Hernias - Incisional - Reducible (Large midline laparotomy incision, small extractions had a prostatectomy hernia, this appears to be reducible. Patient also has a component of rectus diastases.) .    Assessment & Plan Donald Ok MD; 09/26/2019 10:54 AM) [name] HERNIA, WITHOUT OBSTRUCTION OR GANGRENE (K43.2) Impression: Patient is a 69 year old male with a history of hypertension, hyperlipidemia, gout, who comes in status post prostatectomy. Patient appears to have an incisional hernia at the extraction site. We'll proceed to the operating for robotic incisional hernia repair with mesh.  I did discuss with him the risks and benefits of the procedure to include but not limited to: Infection, bleeding, damage to structures, possible recurrence.  Patient was understanding and wishes to proceed.

## 2019-10-11 NOTE — Progress Notes (Addendum)
[place], [place] [address] Phone: [phone] Fax: [phone]    Your procedure is scheduled on Wednesday, May 12th.  Report to [facility] Main Entrance "A" at 11:00 A.M., and check in at the Admitting office.  Call this number if you have problems the morning of surgery:  [phone]  Call [phone] if you have any questions prior to your surgery date Monday-Friday 8am-4pm   Remember:  Do not eat after midnight the night before your surgery  You may drink clear liquids until 10:00 A.M. the morning of your surgery.   Clear liquids allowed are: Water, Non-Citrus Juices (without pulp), Carbonated Beverages, Clear Tea, Black Coffee Only, and Gatorade   Please complete your PRE-SURGERY G2 that was provided to you by 10:00 A.M. the morning of surgery.  Please, if able, drink it in one setting. DO NOT SIP.   Take these medicines the morning of surgery with A SIP OF WATER  allopurinol (ZYLOPRIM) amLODipine (NORVASC) colchicine  metoprolol tartrate (LOPRESSOR)  If needed - acetaminophen (TYLENOL), eye drops    Follow your surgeon's instructions on when to stop Aspirin.  If no instructions were given by your surgeon then you will need to call the office to get those instructions.    As of today, STOP taking any Aspirin (unless otherwise instructed by your surgeon) and Aspirin containing products, Aleve, Naproxen, Ibuprofen, Motrin, Advil, Goody's, BC's, all herbal medications, fish oil, and all vitamins.  HOW TO MANAGE YOUR DIABETES BEFORE AND AFTER SURGERY  Why is it important to control my blood sugar before and after surgery? . Improving blood sugar levels before and after surgery helps healing and can limit problems. . A way of improving blood sugar control is eating a healthy diet by: o  Eating less sugar and carbohydrates o  Increasing activity/exercise o  Talking with your doctor about  reaching your blood sugar goals . High blood sugars (greater than 180 mg/dL) can raise your risk of infections and slow your recovery, so you will need to focus on controlling your diabetes during the weeks before surgery. . Make sure that the doctor who takes care of your diabetes knows about your planned surgery including the date and location.  How do I manage my blood sugar before surgery? . Check your blood sugar at least 4 times a day, starting 2 days before surgery, to make sure that the level is not too high or low. . Check your blood sugar the morning of your surgery when you wake up and every 2 hours until you get to the Short Stay unit. o If your blood sugar is less than 70 mg/dL, you will need to treat for low blood sugar: - Do not take insulin. - Treat a low blood sugar (less than 70 mg/dL) with  cup of clear juice (cranberry or apple), 4 glucose tablets, OR glucose gel. - Recheck blood sugar in 15 minutes after treatment (to make sure it is greater than 70 mg/dL). If your blood sugar is not greater than 70 mg/dL on recheck, call [phone] for further instructions. . Report your blood sugar to the short stay nurse when you get to Short Stay.  . If you are admitted to the hospital after surgery: o Your blood sugar will be checked by the staff and you will probably be given insulin after surgery (instead of oral diabetes medicines) to make sure you have good blood sugar levels.  o The goal for blood sugar control after surgery is 80-180 mg/dL.             Do not wear jewelry.            Do not wear lotions, powders, colognes, or deodorant.            Men may shave face and neck.            Do not bring valuables to the hospital.            [facility] is not responsible for any belongings or valuables.  Do NOT Smoke (Tobacco/Vapping) or drink Alcohol 24 hours prior to your procedure If you use a CPAP at night, you may bring all equipment for your overnight stay.   Contacts,  glasses, dentures or bridgework may not be worn into surgery.      For patients admitted to the hospital, discharge time will be determined by your treatment team.   Patients discharged the day of surgery will not be allowed to drive home, and someone needs to stay with them for 24 hours.  Special instructions:   [facility]- Preparing For Surgery  Before surgery, you can play an important role. Because skin is not sterile, your skin needs to be as free of germs as possible. You can reduce the number of germs on your skin by washing with CHG (chlorahexidine gluconate) Soap before surgery.  CHG is an antiseptic cleaner which kills germs and bonds with the skin to continue killing germs even after washing.    Oral Hygiene is also important to reduce your risk of infection.  Remember - BRUSH YOUR TEETH THE MORNING OF SURGERY WITH YOUR REGULAR TOOTHPASTE  Please do not use if you have an allergy to CHG or antibacterial soaps. If your skin becomes reddened/irritated stop using the CHG.  Do not shave (including legs and underarms) for at least 48 hours prior to first CHG shower. It is OK to shave your face.  Please follow these instructions carefully.   1. Shower the NIGHT BEFORE SURGERY and the MORNING OF SURGERY with CHG Soap.   2. If you chose to wash your hair, wash your hair first as usual with your normal shampoo.  3. After you shampoo, rinse your hair and body thoroughly to remove the shampoo.  4. Use CHG as you would any other liquid soap. You can apply CHG directly to the skin and wash gently with a scrungie or a clean washcloth.   5. Apply the CHG Soap to your body ONLY FROM THE NECK DOWN.  Do not use on open wounds or open sores. Avoid contact with your eyes, ears, mouth and genitals (private parts). Wash Face and genitals (private parts)  with your normal soap.   6. Wash thoroughly, paying special attention to the area where your surgery will be performed.  7. Thoroughly rinse  your body with warm water from the neck down.  8. DO NOT shower/wash with your normal soap after using and rinsing off the CHG Soap.  9. Pat yourself dry with a CLEAN TOWEL.  10. Wear CLEAN PAJAMAS to bed the night before surgery, wear comfortable clothes the morning of surgery  11. Place CLEAN SHEETS on your bed the night of your first shower and DO NOT SLEEP WITH PETS.  Day of Surgery: Shower with CHG soap as instructed above. Do not apply any deodorants/lotions.  Please wear clean clothes to the hospital/surgery center.   Remember  to brush your teeth WITH YOUR REGULAR TOOTHPASTE.   Please read over the following fact sheets that you were given.

## 2019-10-12 ENCOUNTER — Other Ambulatory Visit: Payer: Self-pay

## 2019-10-12 ENCOUNTER — Encounter (HOSPITAL_COMMUNITY)
Admission: RE | Admit: 2019-10-12 | Discharge: 2019-10-12 | Disposition: A | Discharge status: Home / Self Care | Payer: Medicare Other | Source: Ambulatory Visit | Attending: General Surgery | Admitting: General Surgery

## 2019-10-12 ENCOUNTER — Encounter (HOSPITAL_COMMUNITY): Payer: Self-pay

## 2019-10-12 DIAGNOSIS — Z7982 Long term (current) use of aspirin: Secondary | ICD-10-CM | POA: Insufficient documentation

## 2019-10-12 DIAGNOSIS — Z7901 Long term (current) use of anticoagulants: Secondary | ICD-10-CM | POA: Insufficient documentation

## 2019-10-12 DIAGNOSIS — D649 Anemia, unspecified: Secondary | ICD-10-CM | POA: Insufficient documentation

## 2019-10-12 DIAGNOSIS — Z87891 Personal history of nicotine dependence: Secondary | ICD-10-CM | POA: Diagnosis not present

## 2019-10-12 DIAGNOSIS — K432 Incisional hernia without obstruction or gangrene: Secondary | ICD-10-CM | POA: Insufficient documentation

## 2019-10-12 DIAGNOSIS — Z01812 Encounter for preprocedural laboratory examination: Secondary | ICD-10-CM | POA: Diagnosis not present

## 2019-10-12 DIAGNOSIS — R7303 Prediabetes: Secondary | ICD-10-CM | POA: Diagnosis not present

## 2019-10-12 DIAGNOSIS — E785 Hyperlipidemia, unspecified: Secondary | ICD-10-CM | POA: Diagnosis not present

## 2019-10-12 DIAGNOSIS — I129 Hypertensive chronic kidney disease with stage 1 through stage 4 chronic kidney disease, or unspecified chronic kidney disease: Secondary | ICD-10-CM | POA: Diagnosis not present

## 2019-10-12 DIAGNOSIS — Z79899 Other long term (current) drug therapy: Secondary | ICD-10-CM | POA: Diagnosis not present

## 2019-10-12 DIAGNOSIS — N183 Chronic kidney disease, stage 3 unspecified: Secondary | ICD-10-CM | POA: Insufficient documentation

## 2019-10-12 HISTORY — DX: Nonrheumatic aortic (valve) insufficiency: I35.1

## 2019-10-12 HISTORY — DX: Prediabetes: R73.03

## 2019-10-12 LAB — CBC
HCT: 44.9 % (ref 39.0–52.0)
Hemoglobin: 14.1 g/dL (ref 13.0–17.0)
MCH: 27.8 pg (ref 26.0–34.0)
MCHC: 31.4 g/dL (ref 30.0–36.0)
MCV: 88.4 fL (ref 80.0–100.0)
Platelets: 201 10*3/uL (ref 150–400)
RBC: 5.08 MIL/uL (ref 4.22–5.81)
RDW: 14.8 % (ref 11.5–15.5)
WBC: 5.1 10*3/uL (ref 4.0–10.5)
nRBC: 0 % (ref 0.0–0.2)

## 2019-10-12 LAB — BASIC METABOLIC PANEL
Anion gap: 7 (ref 5–15)
BUN: 22 mg/dL (ref 8–23)
CO2: 35 mmol/L — ABNORMAL HIGH (ref 22–32)
Calcium: 8.8 mg/dL — ABNORMAL LOW (ref 8.9–10.3)
Chloride: 100 mmol/L (ref 98–111)
Creatinine, Ser: 2.07 mg/dL — ABNORMAL HIGH (ref 0.61–1.24)
GFR calc Af Amer: 37 mL/min — ABNORMAL LOW (ref >60)
GFR calc non Af Amer: 32 mL/min — ABNORMAL LOW (ref >60)
Glucose, Bld: 117 mg/dL — ABNORMAL HIGH (ref 70–99)
Potassium: 3.3 mmol/L — ABNORMAL LOW (ref 3.5–5.1)
Sodium: 142 mmol/L (ref 135–145)

## 2019-10-12 LAB — GLUCOSE, CAPILLARY: Glucose-Capillary: 99 mg/dL (ref 70–99)

## 2019-10-12 NOTE — Progress Notes (Signed)
PCP - Dr. Cathlean Cower Cardiologist - Dr. Alfonzo Beers Nephrology - Dr. Edrick Oh - last office note requested  PPM/ICD - denies  Chest x-ray - N/A EKG - 03/31/2019 Stress Test - 12/14/2018 ECHO - 04/17/2019 Cardiac Cath - denies  Sleep Study - denies CPAP - N/A  DM: patient stated he is pre-diabetic and has cut down on sugar intake CBG: 99  Blood Thinner Instructions: N/A Aspirin Instructions: Instructed to call surgeon's office for those instructions   ERAS Protcol - Yes PRE-SURGERY Ensure or G2- G2  Given  COVID TEST- Scheduled for 10/13/2019. Patient verbalized understanding of self-quarantine instructions, appointment time and place.  Anesthesia review: YES, abnormal EKG, CKD, records requested  Patient denies shortness of breath, fever, cough and chest pain at PAT appointment  All instructions explained to the patient, with a verbal understanding of the material. Patient agrees to go over the instructions while at home for a better understanding. Patient also instructed to self quarantine after being tested for COVID-19. The opportunity to ask questions was provided.

## 2019-10-13 ENCOUNTER — Other Ambulatory Visit (HOSPITAL_COMMUNITY)
Admission: RE | Admit: 2019-10-13 | Discharge: 2019-10-13 | Disposition: A | Discharge status: Home / Self Care | Payer: Medicare Other | Source: Ambulatory Visit | Attending: General Surgery | Admitting: General Surgery

## 2019-10-13 DIAGNOSIS — Z01812 Encounter for preprocedural laboratory examination: Secondary | ICD-10-CM | POA: Insufficient documentation

## 2019-10-13 DIAGNOSIS — Z20822 Contact with and (suspected) exposure to covid-19: Secondary | ICD-10-CM | POA: Diagnosis not present

## 2019-10-13 LAB — SARS CORONAVIRUS 2 (TAT 6-24 HRS): SARS Coronavirus 2: NEGATIVE

## 2019-10-15 ENCOUNTER — Encounter (HOSPITAL_COMMUNITY): Payer: Self-pay

## 2019-10-15 NOTE — Progress Notes (Signed)
Anesthesia Chart Review:  Case: [id number] Date/Time: 10/17/19 1245   Procedure: XI ROBOTIC ASSISTED INCISIONAL HERNIA REPAIR WITH MESH (N/A )   Anesthesia type: General   Pre-op diagnosis: INCISIONAL HERNIA WITH MESH   Location: [place] OR ROOM 10 / [place] OR   Surgeons: Ralene Ok, MD      DISCUSSION: Patient is a 69 year old male scheduled for the above procedure.  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 He is originially from [place].  Last seen by cardiologist, Mertie Moores, MD on 04/06/19. Initially seen in June 2020 following ED visit for chest. Lexiscan Myoview 12/14/18 was non-ischemic but with EF 38%; however, Dr. Acie Fredrickson felt results were erroneously low. He did order an echo to confirm EF which was done 04/17/19 and showed LVEF 55-60%, moderate AR. Amlodipine was added for better HTN control. Six month follow-up planned with repeat echo in ~ 1 year.   Last nephrology visit with Dr. Justin Mend on 08/28/19. He notes patient's Creatinine ha been ~ 2.1-2.41 over the last several years, but up to ~ 2.5-3.5 range in setting of obstructive urology (02/2019) and UTI post prostatectomy (10/22020). He notes recent referral to surgeon for lage abdominal hernia. Renal function felt stable, and six month follow-up planned. Preoperative Cr 2.07.   10/13/2019 presurgical COVID-19 test negative.  Anesthesia team to evaluate on the day of surgery.   VS: BP (!) 147/92   Pulse 65   Temp 36.9 C (Oral)   Resp 18   Ht 6\' 1"  (1.854 m)   Wt 122.5 kg   SpO2 98%   BMI 35.62 kg/m     PROVIDERS: Biagio Borg, MD is PCP Mertie Moores, MD is cardiologist Edrick Oh, MD is nephrologist Nicolette Bang, MD is urologist   LABS: Preoperative labs noted. Renal function stable. A1c 5.8% 07/06/19.  (all labs ordered  are listed, but only abnormal results are displayed)  Labs Reviewed  BASIC METABOLIC PANEL - Abnormal; Notable for the following components:      Result Value   Potassium 3.3 (*)    CO2 35 (*)    Glucose, Bld 117 (*)    Creatinine, Ser 2.07 (*)    Calcium 8.8 (*)    GFR calc non Af Amer 32 (*)    GFR calc Af Amer 37 (*)    All other components within normal limits  GLUCOSE, CAPILLARY  CBC     IMAGES: Xray ABD 2V with 1V chest 07/06/19: IMPRESSION: 1. Large amount of stool throughout the colon without evidence of bowel obstruction. 2. No acute cardiopulmonary disease.   EKG: 03/31/19: Sinus rhythm Right bundle branch block Left ventricular hypertrophy similar to prior today Confirmed by Aletta Edouard [id number]) on 04/01/2019 12:56:34 PM   CV: UE Venous US 09/06/19: Summary:  Right:  No evidence of thrombosis in the subclavian.  Left:  No evidence of deep vein thrombosis in the upper extremity. No evidence of  superficial vein thrombosis in the upper extremity.   Echo 04/17/19: IMPRESSIONS  1. Left ventricular ejection fraction, by visual estimation, is 55 to  60%. The left ventricle has normal function. There is moderately increased  left ventricular hypertrophy.  2. Left ventricular diastolic parameters are consistent with Grade I  diastolic dysfunction (impaired relaxation).  3. Global right ventricle has normal systolic function.The right  ventricular size is normal. No increase  in right ventricular wall  thickness.  4. Left atrial size was moderately dilated.  5. Right atrial size was normal.  6. The mitral valve is abnormal. Trace mitral valve regurgitation.  7. The tricuspid valve is grossly normal. Tricuspid valve regurgitation  is trivial.  8. Aortic valve regurgitation is moderate.  9. The aortic valve is tricuspid. Aortic valve regurgitation is moderate.  Mild to moderate aortic valve sclerosis/calcification without any evidence  of aortic  stenosis.  10. The pulmonic valve was grossly normal. Pulmonic valve regurgitation is  trivial.  11. Aortic dilatation noted.  12. There is mild dilatation of the ascending aorta measuring 40 mm.  13. Normal pulmonary artery systolic pressure.  14. The inferior vena cava is normal in size with greater than 50%  respiratory variability, suggesting right atrial pressure of 3 mmHg.   Nuclear stress test 12/14/18:  Nuclear stress EF: 38%.  There was no ST segment deviation noted during stress.  Defect 1: There is a small defect of severe severity present in the apex location.  This is an intermediate risk study.  The left ventricular ejection fraction is moderately decreased (30-44%).  Abnormal, intermediate risk stress nuclear study with no ischemia or infarction.  Gated ejection fraction 38% with global hypokinesis and moderate left ventricular enlargement.  Study interpreted as intermediate risk due to reduced LV function.   Past Medical History:  Diagnosis Date  . Anemia    normal Fe, nl B12, nl retic, nl EPO July '13  . Blood transfusion without reported diagnosis   . BPH (benign prostatic hyperplasia)   . Chronic back pain   . Chronic kidney disease    CKD III, obstructive nephropathy  . Diverticulosis   . Dysuria   . Elevated PSA, greater than or equal to 20 ng/ml June '13   PSA 107  . Hemorrhoids, internal, with bleeding, prolapse 09/19/2014  . Hyperlipidemia 05/29/2014  . Hypertension   . Obstructive uropathy 11/27/2015  . Pre-diabetes    per patient "reduced sugar intake"  . Renal insufficiency   . Tuberculosis    h/o PPD +  . UTI (urinary tract infection)   . Vitamin D deficiency 09/13/2017    Past Surgical History:  Procedure Laterality Date  . CARPAL TUNNEL RELEASE Right   . COLONOSCOPY    . HEMORRHOID BANDING    . SPLENECTOMY    . XI ROBOTIC ASSISTED SIMPLE PROSTATECTOMY N/A 03/09/2019   Procedure: XI ROBOTIC ASSISTED SIMPLE PROSTATECTOMY;  Surgeon:  Cleon Gustin, MD;  Location: [facility] ORS;  Service: Urology;  Laterality: N/A;    MEDICATIONS: . acetaminophen (TYLENOL) 500 MG tablet  . allopurinol (ZYLOPRIM) 100 MG tablet  . amLODipine (NORVASC) 5 MG tablet  . aspirin EC 81 MG tablet  . atorvastatin (LIPITOR) 40 MG tablet  . cephALEXin (KEFLEX) 500 MG capsule  . cholecalciferol (VITAMIN D3) 25 MCG (1000 UNIT) tablet  . colchicine 0.6 MG tablet  . ferrous sulfate 325 (65 FE) MG tablet  . finasteride (PROSCAR) 5 MG tablet  . HYDROcodone-acetaminophen (NORCO/VICODIN) 5-325 MG tablet  . hydroxypropyl methylcellulose / hypromellose (ISOPTO TEARS / GONIOVISC) 2.5 % ophthalmic solution  . metoprolol tartrate (LOPRESSOR) 25 MG tablet  . Multiple Vitamin (MULTIVITAMIN WITH MINERALS) TABS tablet  . Potassium 99 MG TABS  . predniSONE (DELTASONE) 50 MG tablet   No current facility-administered medications for this encounter.  Prednisone 50 mg was a 4 day course ordered last month. Not currently taking Norco or Keflex.    Ebony Hail  [facility] Short Stay/Anesthesiology [facility] Phone [phone] [facility] Phone [phone] 10/15/2019 10:09 AM

## 2019-10-15 NOTE — Anesthesia Preprocedure Evaluation (Addendum)
Anesthesia Evaluation  Patient identified by MRN, date of birth, ID band Patient awake    Reviewed: Allergy & Precautions, NPO status , Patient's Chart, lab work & pertinent test results, reviewed documented beta blocker date and time   History of Anesthesia Complications Negative for: history of anesthetic complications  Airway Mallampati: I  TM Distance: >3 FB Neck ROM: Full    Dental  (+) Dental Advisory Given, Edentulous Upper   Pulmonary former smoker,  10/13/2019 SARS coronavirus NEG   breath sounds clear to auscultation       Cardiovascular hypertension, Pt. on medications and Pt. on home beta blockers (-) angina Rhythm:Regular Rate:Normal  '20 ECHO: EF 55-60%, moderate AI, no AS   Neuro/Psych negative neurological ROS     GI/Hepatic Neg liver ROS, Incisional hernia   Endo/Other  Morbid obesity  Renal/GU Renal InsufficiencyRenal disease (creat 2.07)     Musculoskeletal  (+) Arthritis ,   Abdominal (+) + obese,   Peds  Hematology negative hematology ROS (+)   Anesthesia Other Findings   Reproductive/Obstetrics                            Anesthesia Physical Anesthesia Plan  ASA: III  Anesthesia Plan: General   Post-op Pain Management:    Induction: Intravenous  PONV Risk Score and Plan: 2  Airway Management Planned: Oral ETT  Additional Equipment: None  Intra-op Plan:   Post-operative Plan: Extubation in OR  Informed Consent: I have reviewed the patients History and Physical, chart, labs and discussed the procedure including the risks, benefits and alternatives for the proposed anesthesia with the patient or authorized representative who has indicated his/her understanding and acceptance.     Dental advisory given  Plan Discussed with: CRNA and Surgeon  Anesthesia Plan Comments: (PAT note written 10/15/2019 by Myra Gianotti, PA-C. )      Anesthesia Quick  Evaluation

## 2019-10-17 ENCOUNTER — Other Ambulatory Visit: Payer: Self-pay | Admitting: Internal Medicine

## 2019-10-17 ENCOUNTER — Ambulatory Visit (HOSPITAL_COMMUNITY): Payer: Medicare Other | Admitting: Vascular Surgery

## 2019-10-17 ENCOUNTER — Inpatient Hospital Stay (HOSPITAL_COMMUNITY)
Admission: RE | Admit: 2019-10-17 | Discharge: 2019-10-25 | DRG: 335 | Disposition: A | Discharge status: Home / Self Care | Payer: Medicare Other | Attending: General Surgery | Admitting: General Surgery

## 2019-10-17 ENCOUNTER — Ambulatory Visit (HOSPITAL_COMMUNITY): Payer: Medicare Other | Admitting: Certified Registered"

## 2019-10-17 ENCOUNTER — Encounter (HOSPITAL_COMMUNITY)
Admission: RE | Disposition: A | Discharge status: Home / Self Care | Payer: Self-pay | Source: Home / Self Care | Attending: General Surgery

## 2019-10-17 ENCOUNTER — Other Ambulatory Visit: Payer: Self-pay

## 2019-10-17 ENCOUNTER — Encounter (HOSPITAL_COMMUNITY): Payer: Self-pay | Admitting: General Surgery

## 2019-10-17 DIAGNOSIS — M109 Gout, unspecified: Secondary | ICD-10-CM | POA: Diagnosis not present

## 2019-10-17 DIAGNOSIS — Z7982 Long term (current) use of aspirin: Secondary | ICD-10-CM | POA: Diagnosis not present

## 2019-10-17 DIAGNOSIS — K660 Peritoneal adhesions (postprocedural) (postinfection): Secondary | ICD-10-CM | POA: Diagnosis not present

## 2019-10-17 DIAGNOSIS — Z823 Family history of stroke: Secondary | ICD-10-CM

## 2019-10-17 DIAGNOSIS — E785 Hyperlipidemia, unspecified: Secondary | ICD-10-CM | POA: Diagnosis not present

## 2019-10-17 DIAGNOSIS — Z833 Family history of diabetes mellitus: Secondary | ICD-10-CM

## 2019-10-17 DIAGNOSIS — R739 Hyperglycemia, unspecified: Secondary | ICD-10-CM | POA: Diagnosis not present

## 2019-10-17 DIAGNOSIS — I9589 Other hypotension: Secondary | ICD-10-CM | POA: Diagnosis not present

## 2019-10-17 DIAGNOSIS — E876 Hypokalemia: Secondary | ICD-10-CM | POA: Diagnosis not present

## 2019-10-17 DIAGNOSIS — I129 Hypertensive chronic kidney disease with stage 1 through stage 4 chronic kidney disease, or unspecified chronic kidney disease: Secondary | ICD-10-CM | POA: Diagnosis not present

## 2019-10-17 DIAGNOSIS — E559 Vitamin D deficiency, unspecified: Secondary | ICD-10-CM | POA: Diagnosis not present

## 2019-10-17 DIAGNOSIS — I351 Nonrheumatic aortic (valve) insufficiency: Secondary | ICD-10-CM | POA: Diagnosis not present

## 2019-10-17 DIAGNOSIS — K567 Ileus, unspecified: Secondary | ICD-10-CM | POA: Diagnosis not present

## 2019-10-17 DIAGNOSIS — Z8719 Personal history of other diseases of the digestive system: Secondary | ICD-10-CM

## 2019-10-17 DIAGNOSIS — Z9081 Acquired absence of spleen: Secondary | ICD-10-CM

## 2019-10-17 DIAGNOSIS — Z9079 Acquired absence of other genital organ(s): Secondary | ICD-10-CM

## 2019-10-17 DIAGNOSIS — N183 Chronic kidney disease, stage 3 unspecified: Secondary | ICD-10-CM | POA: Diagnosis not present

## 2019-10-17 DIAGNOSIS — N170 Acute kidney failure with tubular necrosis: Secondary | ICD-10-CM | POA: Diagnosis not present

## 2019-10-17 DIAGNOSIS — D631 Anemia in chronic kidney disease: Secondary | ICD-10-CM | POA: Diagnosis not present

## 2019-10-17 DIAGNOSIS — E860 Dehydration: Secondary | ICD-10-CM | POA: Diagnosis not present

## 2019-10-17 DIAGNOSIS — Z9889 Other specified postprocedural states: Secondary | ICD-10-CM

## 2019-10-17 DIAGNOSIS — M1990 Unspecified osteoarthritis, unspecified site: Secondary | ICD-10-CM | POA: Diagnosis not present

## 2019-10-17 DIAGNOSIS — E7800 Pure hypercholesterolemia, unspecified: Secondary | ICD-10-CM | POA: Diagnosis present

## 2019-10-17 DIAGNOSIS — N400 Enlarged prostate without lower urinary tract symptoms: Secondary | ICD-10-CM | POA: Diagnosis present

## 2019-10-17 DIAGNOSIS — K432 Incisional hernia without obstruction or gangrene: Secondary | ICD-10-CM | POA: Diagnosis not present

## 2019-10-17 HISTORY — PX: XI ROBOTIC ASSISTED VENTRAL HERNIA: SHX6789

## 2019-10-17 HISTORY — PX: INSERTION OF MESH: SHX5868

## 2019-10-17 SURGERY — REPAIR, HERNIA, VENTRAL, ROBOT-ASSISTED
Anesthesia: General | Site: Abdomen

## 2019-10-17 MED ORDER — DEXAMETHASONE SODIUM PHOSPHATE 10 MG/ML IJ SOLN
INTRAMUSCULAR | Status: DC | PRN
  Administered 2019-10-17: 8 mg via INTRAVENOUS

## 2019-10-17 MED ORDER — BUPIVACAINE HCL (PF) 0.25 % IJ SOLN
INTRAMUSCULAR | Status: AC
  Filled 2019-10-17: qty 10

## 2019-10-17 MED ORDER — ORAL CARE MOUTH RINSE: 15.0000 mL | Freq: Once | OROMUCOSAL | Status: AC

## 2019-10-17 MED ORDER — BISACODYL 5 MG PO TBEC
5.0000 mg | DELAYED_RELEASE_TABLET | Freq: Every day | ORAL | Status: DC | PRN
  Administered 2019-10-19 – 2019-10-23 (×3): 5 mg via ORAL
  Filled 2019-10-17 (×3): qty 1

## 2019-10-17 MED ORDER — PHENYLEPHRINE 40 MCG/ML (10ML) SYRINGE FOR IV PUSH (FOR BLOOD PRESSURE SUPPORT)
PREFILLED_SYRINGE | INTRAVENOUS | Status: AC
  Filled 2019-10-17: qty 10

## 2019-10-17 MED ORDER — 0.9 % SODIUM CHLORIDE (POUR BTL) OPTIME
TOPICAL | Status: DC | PRN
  Administered 2019-10-17: 14:00:00 1000 mL

## 2019-10-17 MED ORDER — DEXTROSE-NACL 5-0.9 % IV SOLN: INTRAVENOUS | Status: DC

## 2019-10-17 MED ORDER — ENSURE PRE-SURGERY PO LIQD: 296.0000 mL | Freq: Once | ORAL | Status: DC

## 2019-10-17 MED ORDER — ONDANSETRON HCL 4 MG/2ML IJ SOLN
INTRAMUSCULAR | Status: DC | PRN
  Administered 2019-10-17: 4 mg via INTRAVENOUS

## 2019-10-17 MED ORDER — CHLORHEXIDINE GLUCONATE 0.12 % MT SOLN: 15.0000 mL | Freq: Once | OROMUCOSAL | Status: AC

## 2019-10-17 MED ORDER — HYDROMORPHONE HCL 1 MG/ML IJ SOLN
0.2500 mg | INTRAMUSCULAR | Status: DC | PRN
  Administered 2019-10-17: 17:00:00 0.5 mg via INTRAVENOUS

## 2019-10-17 MED ORDER — SODIUM CHLORIDE 0.9 % IV SOLN: INTRAVENOUS | Status: DC

## 2019-10-17 MED ORDER — MIDAZOLAM HCL 2 MG/2ML IJ SOLN: 0.5000 mg | Freq: Once | INTRAMUSCULAR | Status: DC | PRN

## 2019-10-17 MED ORDER — "VISTASEAL 4 ML SINGLE DOSE KIT "
PACK | CUTANEOUS | Status: DC | PRN
  Administered 2019-10-17 (×2): 4 mL via TOPICAL

## 2019-10-17 MED ORDER — CHLORHEXIDINE GLUCONATE 0.12 % MT SOLN
OROMUCOSAL | Status: AC
  Administered 2019-10-17: 12:00:00 15 mL via OROMUCOSAL
  Filled 2019-10-17: qty 15

## 2019-10-17 MED ORDER — PROPOFOL 10 MG/ML IV BOLUS
INTRAVENOUS | Status: DC | PRN
  Administered 2019-10-17: 200 mg via INTRAVENOUS

## 2019-10-17 MED ORDER — PROPOFOL 10 MG/ML IV BOLUS
INTRAVENOUS | Status: AC
  Filled 2019-10-17: qty 20

## 2019-10-17 MED ORDER — METOPROLOL TARTRATE 12.5 MG HALF TABLET
ORAL_TABLET | ORAL | Status: AC
  Filled 2019-10-17: qty 2

## 2019-10-17 MED ORDER — FENTANYL CITRATE (PF) 100 MCG/2ML IJ SOLN
INTRAMUSCULAR | Status: DC | PRN
  Administered 2019-10-17: 50 ug via INTRAVENOUS
  Administered 2019-10-17: 100 ug via INTRAVENOUS
  Administered 2019-10-17: 25 ug via INTRAVENOUS

## 2019-10-17 MED ORDER — MEPERIDINE HCL 25 MG/ML IJ SOLN: 6.2500 mg | INTRAMUSCULAR | Status: DC | PRN

## 2019-10-17 MED ORDER — GLYCOPYRROLATE 0.2 MG/ML IJ SOLN
INTRAMUSCULAR | Status: DC | PRN
  Administered 2019-10-17: .2 mg via INTRAVENOUS

## 2019-10-17 MED ORDER — BUPIVACAINE HCL 0.25 % IJ SOLN
INTRAMUSCULAR | Status: DC | PRN
  Administered 2019-10-17: 18 mL

## 2019-10-17 MED ORDER — SUCCINYLCHOLINE CHLORIDE 200 MG/10ML IV SOSY
PREFILLED_SYRINGE | INTRAVENOUS | Status: AC
  Filled 2019-10-17: qty 10

## 2019-10-17 MED ORDER — AMLODIPINE BESYLATE 5 MG PO TABS
5.0000 mg | ORAL_TABLET | Freq: Every day | ORAL | Status: DC
  Administered 2019-10-18 – 2019-10-25 (×8): 5 mg via ORAL
  Filled 2019-10-17 (×8): qty 1

## 2019-10-17 MED ORDER — ESMOLOL HCL 100 MG/10ML IV SOLN
INTRAVENOUS | Status: DC | PRN
Start: 2019-10-17 — End: 2019-10-17
  Administered 2019-10-17: 20 mg via INTRAVENOUS
  Administered 2019-10-17: 30 mg via INTRAVENOUS

## 2019-10-17 MED ORDER — MIDAZOLAM HCL 2 MG/2ML IJ SOLN
INTRAMUSCULAR | Status: AC
  Filled 2019-10-17: qty 2

## 2019-10-17 MED ORDER — BUPIVACAINE LIPOSOME 1.3 % IJ SUSP
20.0000 mL | Freq: Once | INTRAMUSCULAR | Status: AC
  Administered 2019-10-17: 16:00:00 20 mL
  Filled 2019-10-17: qty 20

## 2019-10-17 MED ORDER — ACETAMINOPHEN 500 MG PO TABS
ORAL_TABLET | ORAL | Status: AC
  Administered 2019-10-17: 12:00:00 1000 mg via ORAL
  Filled 2019-10-17: qty 2

## 2019-10-17 MED ORDER — HYPROMELLOSE (GONIOSCOPIC) 2.5 % OP SOLN
1.0000 [drp] | Freq: Three times a day (TID) | OPHTHALMIC | Status: DC | PRN
  Filled 2019-10-17: qty 15

## 2019-10-17 MED ORDER — LIDOCAINE 2% (20 MG/ML) 5 ML SYRINGE
INTRAMUSCULAR | Status: AC
  Filled 2019-10-17: qty 5

## 2019-10-17 MED ORDER — ROCURONIUM BROMIDE 100 MG/10ML IV SOLN
INTRAVENOUS | Status: DC | PRN
  Administered 2019-10-17: 30 mg via INTRAVENOUS
  Administered 2019-10-17: 40 mg via INTRAVENOUS
  Administered 2019-10-17: 60 mg via INTRAVENOUS
  Administered 2019-10-17: 30 mg via INTRAVENOUS

## 2019-10-17 MED ORDER — PROMETHAZINE HCL 25 MG/ML IJ SOLN: 6.2500 mg | INTRAMUSCULAR | Status: DC | PRN

## 2019-10-17 MED ORDER — FENTANYL CITRATE (PF) 250 MCG/5ML IJ SOLN
INTRAMUSCULAR | Status: AC
  Filled 2019-10-17: qty 5

## 2019-10-17 MED ORDER — ROCURONIUM BROMIDE 10 MG/ML (PF) SYRINGE
PREFILLED_SYRINGE | INTRAVENOUS | Status: AC
  Filled 2019-10-17: qty 10

## 2019-10-17 MED ORDER — "VISTASEAL 4 ML SINGLE DOSE KIT "
8.0000 mL | PACK | Freq: Once | CUTANEOUS | Status: DC
  Filled 2019-10-17: qty 8

## 2019-10-17 MED ORDER — DEXTROSE 5 % IV SOLN
3.0000 g | INTRAVENOUS | Status: AC
  Administered 2019-10-17: 13:00:00 3 g via INTRAVENOUS
  Filled 2019-10-17: qty 3000
  Filled 2019-10-17: qty 3

## 2019-10-17 MED ORDER — ACETAMINOPHEN 500 MG PO TABS: 1000.0000 mg | ORAL_TABLET | ORAL | Status: AC

## 2019-10-17 MED ORDER — GABAPENTIN 300 MG PO CAPS
300.0000 mg | ORAL_CAPSULE | Freq: Two times a day (BID) | ORAL | Status: DC
  Administered 2019-10-17 – 2019-10-25 (×14): 300 mg via ORAL
  Filled 2019-10-17 (×15): qty 1

## 2019-10-17 MED ORDER — COLCHICINE 0.6 MG PO TABS
0.6000 mg | ORAL_TABLET | Freq: Every day | ORAL | Status: DC
  Administered 2019-10-18 – 2019-10-21 (×4): 0.6 mg via ORAL
  Filled 2019-10-17 (×4): qty 1

## 2019-10-17 MED ORDER — METOPROLOL TARTRATE 25 MG PO TABS
25.0000 mg | ORAL_TABLET | Freq: Every day | ORAL | Status: DC
  Administered 2019-10-17 – 2019-10-21 (×5): 25 mg via ORAL
  Filled 2019-10-17 (×5): qty 1

## 2019-10-17 MED ORDER — HYDROMORPHONE HCL 1 MG/ML IJ SOLN
INTRAMUSCULAR | Status: AC
  Filled 2019-10-17: qty 1

## 2019-10-17 MED ORDER — TRAMADOL HCL 50 MG PO TABS
50.0000 mg | ORAL_TABLET | Freq: Four times a day (QID) | ORAL | Status: DC | PRN
  Administered 2019-10-17 – 2019-10-24 (×7): 50 mg via ORAL
  Filled 2019-10-17 (×8): qty 1

## 2019-10-17 MED ORDER — LACTATED RINGERS IV SOLN: INTRAVENOUS | Status: DC | PRN

## 2019-10-17 MED ORDER — OXYCODONE-ACETAMINOPHEN 5-325 MG PO TABS
1.0000 | ORAL_TABLET | ORAL | Status: DC | PRN
  Administered 2019-10-17: 23:00:00 2 via ORAL
  Administered 2019-10-18: 1 via ORAL
  Administered 2019-10-18 (×2): 2 via ORAL
  Filled 2019-10-17 (×4): qty 2

## 2019-10-17 MED ORDER — ALLOPURINOL 100 MG PO TABS
200.0000 mg | ORAL_TABLET | Freq: Every day | ORAL | Status: DC
  Administered 2019-10-18 – 2019-10-25 (×8): 200 mg via ORAL
  Filled 2019-10-17 (×8): qty 2

## 2019-10-17 MED ORDER — STERILE WATER FOR IRRIGATION IR SOLN
Status: DC | PRN
  Administered 2019-10-17: 1000 mL

## 2019-10-17 MED ORDER — LABETALOL HCL 5 MG/ML IV SOLN
INTRAVENOUS | Status: AC
  Filled 2019-10-17: qty 4

## 2019-10-17 MED ORDER — METOPROLOL TARTRATE 25 MG PO TABS
25.0000 mg | ORAL_TABLET | Freq: Once | ORAL | Status: AC
  Administered 2019-10-17: 12:00:00 25 mg via ORAL
  Filled 2019-10-17: qty 1

## 2019-10-17 MED ORDER — CHLORHEXIDINE GLUCONATE CLOTH 2 % EX PADS: 6.0000 | MEDICATED_PAD | Freq: Once | CUTANEOUS | Status: DC

## 2019-10-17 MED ORDER — VISTASEAL 10 ML SINGLE DOSE KIT
10.0000 mL | PACK | Freq: Once | CUTANEOUS | Status: DC
  Filled 2019-10-17: qty 10

## 2019-10-17 MED ORDER — SUGAMMADEX SODIUM 200 MG/2ML IV SOLN
INTRAVENOUS | Status: DC | PRN
  Administered 2019-10-17: 200 mg via INTRAVENOUS

## 2019-10-17 MED ORDER — PHENYLEPHRINE HCL-NACL 10-0.9 MG/250ML-% IV SOLN
INTRAVENOUS | Status: DC | PRN
Start: 2019-10-17 — End: 2019-10-17
  Administered 2019-10-17: 35 ug/min via INTRAVENOUS

## 2019-10-17 MED ORDER — LIDOCAINE 2% (20 MG/ML) 5 ML SYRINGE
INTRAMUSCULAR | Status: DC | PRN
  Administered 2019-10-17: 20 mg via INTRAVENOUS
  Administered 2019-10-17 (×2): 10 mg via INTRAVENOUS
  Administered 2019-10-17: 40 mg via INTRAVENOUS

## 2019-10-17 MED ORDER — BUPIVACAINE HCL (PF) 0.25 % IJ SOLN
INTRAMUSCULAR | Status: AC
  Filled 2019-10-17: qty 20

## 2019-10-17 MED ORDER — ENOXAPARIN SODIUM 40 MG/0.4ML ~~LOC~~ SOLN
40.0000 mg | SUBCUTANEOUS | Status: DC
  Administered 2019-10-18 – 2019-10-20 (×3): 40 mg via SUBCUTANEOUS
  Filled 2019-10-17 (×3): qty 0.4

## 2019-10-17 MED ORDER — MIDAZOLAM HCL 5 MG/5ML IJ SOLN
INTRAMUSCULAR | Status: DC | PRN
  Administered 2019-10-17: 2 mg via INTRAVENOUS

## 2019-10-17 SURGICAL SUPPLY — 64 items
ADH SKN CLS APL DERMABOND .7 (GAUZE/BANDAGES/DRESSINGS) ×2
APL LAPSCP 35 DL APL RGD (MISCELLANEOUS)
APL PRP STRL LF DISP 70% ISPRP (MISCELLANEOUS) ×1
APPLICATOR VISTASEAL 35 (MISCELLANEOUS) IMPLANT
BINDER ABDOMINAL 12 ML 46-62 (SOFTGOODS) ×2 IMPLANT
CANNULA REDUC XI 12-8 STAPL (CANNULA) ×2
CANNULA REDUC XI 12-8MM STAPL (CANNULA) ×1
CANNULA REDUCER 12-8 DVNC XI (CANNULA) ×1 IMPLANT
CHLORAPREP W/TINT 26 (MISCELLANEOUS) ×3 IMPLANT
COVER SURGICAL LIGHT HANDLE (MISCELLANEOUS) ×3 IMPLANT
COVER TIP SHEARS 8 DVNC (MISCELLANEOUS) ×1 IMPLANT
COVER TIP SHEARS 8MM DA VINCI (MISCELLANEOUS) ×6
COVER WAND RF STERILE (DRAPES) IMPLANT
DECANTER SPIKE VIAL GLASS SM (MISCELLANEOUS) ×3 IMPLANT
DEFOGGER SCOPE WARMER CLEARIFY (MISCELLANEOUS) ×2 IMPLANT
DERMABOND ADVANCED (GAUZE/BANDAGES/DRESSINGS) ×4
DERMABOND ADVANCED .7 DNX12 (GAUZE/BANDAGES/DRESSINGS) ×1 IMPLANT
DEVICE SECURE STRAP 25 ABSORB (INSTRUMENTS) IMPLANT
DRAPE ARM DVNC X/XI (DISPOSABLE) ×4 IMPLANT
DRAPE CARDIOVASCULAR INCISE (DRAPES)
DRAPE COLUMN DVNC XI (DISPOSABLE) ×1 IMPLANT
DRAPE CV SPLIT W-CLR ANES SCRN (DRAPES) ×3 IMPLANT
DRAPE DA VINCI XI ARM (DISPOSABLE) ×12
DRAPE DA VINCI XI COLUMN (DISPOSABLE) ×3
DRAPE ORTHO SPLIT 77X108 STRL (DRAPES) ×3
DRAPE SRG 135X102X78XABS (DRAPES) ×1 IMPLANT
DRAPE SURG ORHT 6 SPLT 77X108 (DRAPES) IMPLANT
GLOVE BIO SURGEON STRL SZ7.5 (GLOVE) ×6 IMPLANT
GOWN STRL REUS W/ TWL LRG LVL3 (GOWN DISPOSABLE) ×3 IMPLANT
GOWN STRL REUS W/ TWL XL LVL3 (GOWN DISPOSABLE) ×2 IMPLANT
GOWN STRL REUS W/TWL LRG LVL3 (GOWN DISPOSABLE) ×9
GOWN STRL REUS W/TWL XL LVL3 (GOWN DISPOSABLE) ×6
GRASPER SUT TROCAR 14GX15 (MISCELLANEOUS) ×3 IMPLANT
KIT BASIN OR (CUSTOM PROCEDURE TRAY) ×3 IMPLANT
KIT TURNOVER KIT B (KITS) ×3 IMPLANT
MARKER SKIN DUAL TIP RULER LAB (MISCELLANEOUS) ×3 IMPLANT
MESH VENTRALIGHT ST 8X10 (Mesh General) ×2 IMPLANT
NEEDLE 22X1 1/2 (OR ONLY) (NEEDLE) ×2 IMPLANT
OBTURATOR OPTICAL STANDARD 8MM (TROCAR)
OBTURATOR OPTICAL STND 8 DVNC (TROCAR)
OBTURATOR OPTICALSTD 8 DVNC (TROCAR) IMPLANT
PAD ABD 8X10 STRL (GAUZE/BANDAGES/DRESSINGS) ×6 IMPLANT
PAD ARMBOARD 7.5X6 YLW CONV (MISCELLANEOUS) ×6 IMPLANT
PENCIL SMOKE EVACUATOR (MISCELLANEOUS) IMPLANT
SCISSORS LAP 5X35 DISP (ENDOMECHANICALS) ×3 IMPLANT
SEAL CANN UNIV 5-8 DVNC XI (MISCELLANEOUS) ×3 IMPLANT
SEAL XI 5MM-8MM UNIVERSAL (MISCELLANEOUS) ×18
SET IRRIG TUBING LAPAROSCOPIC (IRRIGATION / IRRIGATOR) IMPLANT
SET TUBE SMOKE EVAC HIGH FLOW (TUBING) ×3 IMPLANT
SLEEVE ENDOPATH XCEL 5M (ENDOMECHANICALS) IMPLANT
SOLUTION ELECTROLUBE (MISCELLANEOUS) IMPLANT
STAPLER CANNULA SEAL DVNC XI (STAPLE) ×1 IMPLANT
STAPLER CANNULA SEAL XI (STAPLE) ×3
STOPCOCK 4 WAY LG BORE MALE ST (IV SETS) ×3 IMPLANT
SUT DVC VLOC 180 0 12IN GS21 (SUTURE) ×6
SUT MNCRL AB 4-0 PS2 18 (SUTURE) ×1 IMPLANT
SUT VIC AB 3-0 SH 27 (SUTURE)
SUT VIC AB 3-0 SH 27X BRD (SUTURE) IMPLANT
SUT VLOC 180 0 9IN  GS21 (SUTURE) ×3
SUT VLOC 180 0 9IN GS21 (SUTURE) IMPLANT
SUTURE DVC VLC 180 0 12IN GS21 (SUTURE) IMPLANT
TOWEL GREEN STERILE FF (TOWEL DISPOSABLE) ×3 IMPLANT
TRAY LAPAROSCOPIC MC (CUSTOM PROCEDURE TRAY) ×3 IMPLANT
TROCAR XCEL NON-BLD 5MMX100MML (ENDOMECHANICALS) ×5 IMPLANT

## 2019-10-17 NOTE — Interval H&P Note (Signed)
History and Physical Interval Note:  10/17/2019 12:20 PM  Prather Mcgugan  has presented today for surgery, with the diagnosis of [place].  The various methods of treatment have been discussed with the patient and family. After consideration of risks, benefits and other options for treatment, the patient has consented to  Procedure(s): XI ROBOTIC [place] (N/A) as a surgical intervention.  The patient's history has been reviewed, patient examined, no change in status, stable for surgery.  I have reviewed the patient's chart and labs.  Questions were answered to the patient's satisfaction.     Ralene Ok

## 2019-10-17 NOTE — Progress Notes (Signed)
Called Dr. Glennon Mac regarding elevated blood pressure.  Order received for Metoprolol 25 mg PO with small sip of water.

## 2019-10-17 NOTE — Anesthesia Postprocedure Evaluation (Signed)
Anesthesia Post Note  Patient: [name], advertising  Procedure(s) Performed: XI ROBOTIC ASSISTED INCISIONAL HERNIA REPAIR WITH MESH (N/A Abdomen) Insertion Of Mesh (N/A Abdomen)     Patient location during evaluation: PACU Anesthesia Type: General Level of consciousness: oriented, sedated and patient cooperative Pain management: pain level controlled Vital Signs Assessment: post-procedure vital signs reviewed and stable Respiratory status: spontaneous breathing, nonlabored ventilation, respiratory function stable and patient connected to nasal cannula oxygen Cardiovascular status: blood pressure returned to baseline and stable Postop Assessment: no apparent nausea or vomiting Anesthetic complications: no    Last Vitals:  Vitals:   10/17/19 1117 10/17/19 1133  BP: (!) 155/115 (!) 168/103  Pulse: 80   Resp: 18   Temp: 36.8 C   SpO2: 98%     Last Pain:  Vitals:   10/17/19 1133  TempSrc:   PainSc: 2                  Alessio Bogan,E. Venola Castello

## 2019-10-17 NOTE — Transfer of Care (Signed)
Immediate Anesthesia Transfer of Care Note  Patient: [name]  Procedure(s) Performed: XI ROBOTIC ASSISTED INCISIONAL HERNIA REPAIR WITH MESH (N/A Abdomen) Insertion Of Mesh (N/A Abdomen)  Patient Location: PACU  Anesthesia Type:General  Level of Consciousness: drowsy  Airway & Oxygen Therapy: Patient Spontanous Breathing and Patient connected to face mask oxygen  Post-op Assessment: Report given to RN and Post -op Vital signs reviewed and stable  Post vital signs: Reviewed and stable  Last Vitals:  Vitals Value Taken Time  BP 149/100 10/17/19 1644  Temp    Pulse 72 10/17/19 1647  Resp 20 10/17/19 1647  SpO2 97 % 10/17/19 1647  Vitals shown include unvalidated device data.  Last Pain:  Vitals:   10/17/19 1133  TempSrc:   PainSc: 2          Complications: No apparent anesthesia complications

## 2019-10-17 NOTE — Anesthesia Procedure Notes (Addendum)
Procedure Name: Intubation Date/Time: 10/17/2019 1:22 PM Performed by: Lavell Luster, CRNA Pre-anesthesia Checklist: Patient identified, Emergency Drugs available, Suction available, Patient being monitored and Timeout performed Patient Re-evaluated:Patient Re-evaluated prior to induction Oxygen Delivery Method: Circle system utilized Preoxygenation: Pre-oxygenation with 100% oxygen Induction Type: IV induction Ventilation: Mask ventilation without difficulty Laryngoscope Size: Mac and 4 Grade View: Grade I Tube type: Oral Tube size: 7.5 mm Number of attempts: 1 Airway Equipment and Method: Stylet Placement Confirmation: ETT inserted through vocal cords under direct vision,  positive ETCO2 and breath sounds checked- equal and bilateral Secured at: 22 cm Tube secured with: Tape Dental Injury: Teeth and Oropharynx as per pre-operative assessment

## 2019-10-17 NOTE — Op Note (Addendum)
10/17/2019  4:19 PM  PATIENT:  [name]  69 y.o. male  PRE-OPERATIVE DIAGNOSIS:  INCISIONAL HERNIA WITH MESH  POST-OPERATIVE DIAGNOSIS:  INCISIONAL HERNIA WITH MESH  PROCEDURE:  Procedure(s): XI ROBOTIC ASSISTED INCISIONAL HERNIA REPAIR WITH MESH with bilateral musculocutaneous advancement flaps, transversus abdominis release bilaterally (N/A)  SURGEON:  Surgeon(s) and Role:    Ralene Ok, MD - Primary  ASSISTANTS: Pryor Curia, RNFA   ANESTHESIA:   local, regional and general  EBL:  50cc   BLOOD ADMINISTERED:none  DRAINS: none   LOCAL MEDICATIONS USED:  BUPIVICAINE  and OTHER [place]paril  SPECIMEN:  No Specimen  DISPOSITION OF SPECIMEN:  N/A  COUNTS:  YES  TOURNIQUET:  * No tourniquets in log *  DICTATION: .Dragon Dictation Indication for procedure:  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 He recently underwent Robotic protatectomy and developed an incisional hernia at the extraction site.  Findings:   There was a 6cm incisional hernia with omental and colonic adhesions to the anterior abdominal wall.  A piece of Bard Ventralight mesh 25x20 cm was placed into the retrorectus space.  This was secured with Vistaseal.  Details of the procedure:  After the patient was consented he was taken back to the operating room placed supine position bilateral SCDs in place. After appropriate antibiotics were confirmed timeout was called and all facts verified.  An Optiview technique was used to gain access to the L retrorectus space.  Blunt dissection was undertaken to create a space inferiorly.  There was dense adhesions superiorly.  A 40mm working port was placed just inferior to the initial port.  At this time I attempted the crossover superiorly and got in the intraabdominal space.  I proceeded to clear away the anterior abdominal wall and the right rectus posterior sheath was seen. This was incisiced.  I was able to creat a space for an 58mm trocar  to be place, a 39mm camera port was placed in the epigastrum under direct vizuliazation. At this time the robot patient cart was docked.   The hernia sac could easily be seen and I proceed to incise the right posterior rectus rectus sheath.  I took this down inferiorly to the arcuate line.  At this time I was able to bluntly dissect away the rectus abdominis muscle from the retroperitoneum.  At this time I was able to connect the right retrorectus fascia to the left.  The linea alba was taken down from the anterior fascia.  At this time this connected the 2 sides.  At this time I was able to incise the right major rectus sheath superiorly.  A majority of the hernia sac was taken down.  The space was limited superiorly therefore decided to double docked and placed 3 8 mm inferior trochars.  At this time I was able to docked the robot and dissect superiorly.  The retrosternal space as well as the adhesions and the edge of the hernia were clearly dissected away.  The retrorectus space was cleared.  At this time I proceeded to incise the anterior fascia of the transversus abdominis muscle.  I did this initially on the left side and then the right side.  This was completed up to the costal margin bilaterally.  I was able to raise musculocutaneous flaps laterally to help reapproximate the midline fascia.  At this time a 30 and 60 cm 0 V-Loc stitch was used to reapproximate the posterior fascia.  This was  done under the pneumoperitoneum of 10 mm.  At this time the anterior fascia was reapproximated using running 0 V-Loc 60 cm in a standard running fashion.  This reapproximated the anterior fascia appropriately.  There were some bites of the hernia sac that were used to imbricate the hernia sac.  At this time the retrorectus space was measured using a ruler.  A 25 x 20 cm area was measured.  At this time a Bard ventral light mesh was chosen and placed into the retrorectus space.  Vistaseal was used to help  approximate the mesh to the anterior and posterior fascia's.  At this time Exparel was used.  Tap blocks bilaterally.  Insufflation was evacuated.  The 12 mm trocar site that was upgraded left upper quadrant was closed using a 0 Vicryl and Endo Close device x1.  At this time insufflation was evacuated and all trochars removed.  All trocar sites were then reapproximate using 4-0 Monocryl subcuticular fashion.  Skin was dressed with Dermabond.  Patient taught procedure well was taken to the recovery in stable condition.  PLAN OF CARE: Admit for overnight observation  PATIENT DISPOSITION:  PACU - hemodynamically stable.   Delay start of Pharmacological VTE agent (>24hrs) due to surgical blood loss or risk of bleeding: not applicable

## 2019-10-18 LAB — CBC
HCT: 40.5 % (ref 39.0–52.0)
Hemoglobin: 12.9 g/dL — ABNORMAL LOW (ref 13.0–17.0)
MCH: 28.4 pg (ref 26.0–34.0)
MCHC: 31.9 g/dL (ref 30.0–36.0)
MCV: 89 fL (ref 80.0–100.0)
Platelets: 170 10*3/uL (ref 150–400)
RBC: 4.55 MIL/uL (ref 4.22–5.81)
RDW: 14.8 % (ref 11.5–15.5)
WBC: 6.5 10*3/uL (ref 4.0–10.5)
nRBC: 0 % (ref 0.0–0.2)

## 2019-10-18 MED ORDER — DIAZEPAM 2 MG PO TABS: 5.0000 mg | ORAL_TABLET | Freq: Four times a day (QID) | ORAL | Status: DC | PRN

## 2019-10-18 MED ORDER — HYDROMORPHONE HCL 1 MG/ML IJ SOLN
1.0000 mg | INTRAMUSCULAR | Status: DC | PRN
  Administered 2019-10-18: 1 mg via INTRAVENOUS
  Filled 2019-10-18: qty 1

## 2019-10-18 MED ORDER — AMLODIPINE BESYLATE 5 MG PO TABS
5.0000 mg | ORAL_TABLET | Freq: Once | ORAL | Status: AC
  Administered 2019-10-18: 5 mg via ORAL
  Filled 2019-10-18: qty 1

## 2019-10-18 NOTE — TOC Initial Note (Signed)
Transition of Care [facility]) - Initial/Assessment Note    Patient Details  Name: [name] MRN: [id number] Date of Birth: [birth year]  Transition of Care [facility] CM/SW Contact:    Marilu Favre, RN Phone Number: 10/18/2019, 10:45 AM  Clinical Narrative:                 Confirmed face sheet information with patient. Patient has transportation to appointments and can get scripts filled. Patient lives alone but has family if assist needed.  Expected Discharge Plan: Home/Self Care Barriers to Discharge: Continued Medical Work up(pain management post op)   Patient Goals and CMS Choice Patient states their goals for this hospitalization and ongoing recovery are:: to go home CMS Medicare.gov Compare Post Acute Care list provided to:: Patient Choice offered to / list presented to : NA  Expected Discharge Plan and Services Expected Discharge Plan: Home/Self Care   Discharge Planning Services: CM Consult Post Acute Care Choice: NA Living arrangements for the past 2 months: Apartment                 DME Arranged: N/A DME Agency: NA       HH Arranged: NA          Prior Living Arrangements/Services Living arrangements for the past 2 months: Apartment Lives with:: Self Patient language and need for interpreter reviewed:: Yes Do you feel safe going back to the place where you live?: Yes      Need for Family Participation in Patient Care: Yes (Comment) Care giver support system in place?: Yes (comment)(David  can assist)   Criminal Activity/Legal Involvement Pertinent to Current Situation/Hospitalization: No - Comment as needed  Activities of Daily Living      Permission Sought/Granted   Permission granted to share information with : No              Emotional Assessment Appearance:: Appears stated age Attitude/Demeanor/Rapport: Engaged Affect (typically observed): Accepting Orientation: : Oriented to Self, Oriented to Place, Oriented to  Time, Oriented to  Situation Alcohol / Substance Use: Not Applicable Psych Involvement: No (comment)  Admission diagnosis:  S/P hernia repair [id number], Z87.19] Patient Active Problem List   Diagnosis Date Noted  . S/P hernia repair 10/17/2019  . Abdominal pain 07/06/2019  . BPH with obstruction/lower urinary tract symptoms 03/09/2019  . Clot retention of urine 02/11/2019  . Vitamin D deficiency 09/13/2017  . Urinary tract infection without hematuria 07/23/2017  . Low back pain 07/03/2017  . Dizziness 06/30/2017  . Acute gouty arthritis 12/01/2016  . Degenerative arthritis of knee, bilateral 11/18/2016  . Urinary retention due to benign prostatic hyperplasia 10/24/2016  . Mass of right side of neck 10/20/2016  . Gout 10/20/2016  . Knee pain, acute 10/12/2016  . Hypokalemia 05/28/2016  . Encounter for well adult exam with abnormal findings 11/27/2015  . Obstructive uropathy 11/27/2015  . Elevated PSA 11/27/2015  . Acute pyelonephritis 05/27/2015  . Generalized bloating 05/27/2015  . Constipation 05/27/2015  . Chest pain 05/27/2015  . AKI (acute kidney injury) (Interior) 05/27/2015  . Acute sinus infection 11/22/2014  . Cough 09/25/2014  . Hemorrhoids, internal, with bleeding, prolapse 09/19/2014  . 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 Hyperlipidemia 05/29/2014  . Lumbar radiculopathy 05/23/2014  . Lumbar stenosis 11/20/2013  . Abnormal glucose 11/06/2013  . Unspecified hereditary and idiopathic peripheral neuropathy 01/22/2013  . 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 CKD (chronic kidney disease) stage 3, GFR 30-59 ml/min (HCC) 11/24/2011  . Hematuria 11/24/2011  .  Hydronephrosis 11/24/2011  . Anemia 11/24/2011  . BRADYCARDIA 02/05/2010  . TINEA PEDIS 01/29/2010  . 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 PERIPHERAL EDEMA 01/29/2010  . ABNORMAL ELECTROCARDIOGRAM 01/29/2010  . 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  . Osteoarthrosis, generalized, 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  . ONYCHOMYCOSIS,  TOENAILS 10/02/2007  . BPH (benign prostatic hyperplasia) 10/02/2007  . Essential hypertension 03/06/2007   PCP:  Biagio Borg, MD Pharmacy:   [facility] [address] [place] [address] Phone: [phone] Fax: [phone]     Social Determinants of Health (SDOH) Interventions    Readmission Risk Interventions Readmission Risk Prevention Plan 02/13/2019  Transportation Screening Complete  PCP or Specialist Appt within 3-5 Days Not Complete  Not Complete comments not ready for dc  HRI or [place] Complete  Social Work Consult for [place] Planning/Counseling Complete  Palliative Care Screening Not Applicable  Medication Review Press photographer) Complete  Some recent data might be hidden

## 2019-10-18 NOTE — Plan of Care (Signed)

## 2019-10-18 NOTE — Progress Notes (Signed)
1 Day Post-Op   Subjective/Chief Complaint: Pt doing well this AM Some abd soreness, better controlled this AM   Objective: Vital signs in last 24 hours: Temp:  [97.1 F (36.2 C)-99.7 F (37.6 C)] 98.3 F (36.8 C) (05/13 0533) Pulse Rate:  [71-82] 82 (05/13 0533) Resp:  [11-19] 18 (05/13 0533) BP: (128-169)/(84-115) 169/104 (05/13 0533) SpO2:  [90 %-98 %] 93 % (05/13 0533) Weight:  [122.5 kg] 122.5 kg (05/12 1117) Last BM Date: 10/16/19  Intake/Output from previous day: 05/12 0701 - 05/13 0700 In: 1550 [I.V.:1500; IV Piggyback:50] Out: 430 [Urine:380; Blood:50] Intake/Output this shift: Total I/O In: -  Out: 300 [Urine:300]  GI: soft, approp ttp, ND, inc c/d/i  Lab Results:  Recent Labs    10/18/19 0159  WBC 6.5  HGB 12.9*  HCT 40.5  PLT 170   Anti-infectives: Anti-infectives (From admission, onward)   Start     Dose/Rate Route Frequency Ordered Stop   10/17/19 1115  ceFAZolin (ANCEF) 3 g in dextrose 5 % 50 mL IVPB     3 g 100 mL/hr over 30 Minutes Intravenous On call to O.R. 10/17/19 1110 10/17/19 1329      Assessment/Plan: s/p Procedure(s): XI ROBOTIC ASSISTED INCISIONAL HERNIA REPAIR WITH MESH (N/A) Insertion Of Mesh (N/A) Advance diet as tol Mobilize pain control   LOS: 0 days    [name] 10/18/2019

## 2019-10-19 MED ORDER — FUROSEMIDE 10 MG/ML IJ SOLN
20.0000 mg | Freq: Once | INTRAMUSCULAR | Status: AC
  Administered 2019-10-19: 20 mg via INTRAVENOUS
  Filled 2019-10-19: qty 2

## 2019-10-19 MED ORDER — IPRATROPIUM-ALBUTEROL 0.5-2.5 (3) MG/3ML IN SOLN
3.0000 mL | RESPIRATORY_TRACT | Status: DC | PRN
  Administered 2019-10-19 – 2019-10-23 (×4): 3 mL via RESPIRATORY_TRACT
  Filled 2019-10-19 (×4): qty 3

## 2019-10-19 MED ORDER — ACETAMINOPHEN 325 MG PO TABS
650.0000 mg | ORAL_TABLET | ORAL | Status: DC | PRN
  Administered 2019-10-19: 650 mg via ORAL
  Filled 2019-10-19: qty 2

## 2019-10-19 NOTE — Progress Notes (Signed)
Call the office and spoke with the nurse of Dr Rosendo Gros to make him aware of pt wheezing and low SaO2 of 89%.  Put the pt in the chair and pt is using the ICS and waiting for a response from Dr  Rosendo Gros

## 2019-10-19 NOTE — Progress Notes (Signed)
2 Days Post-Op   Subjective/Chief Complaint: PT doing wellt his AM Walking in hall Tol some PO No BM   Objective: Vital signs in last 24 hours: Temp:  [98.6 F (37 C)-99.8 F (37.7 C)] 99 F (37.2 C) (05/14 0453) Pulse Rate:  [85-100] 100 (05/14 0453) Resp:  [17-18] 17 (05/14 0453) BP: (142-189)/(101-125) 152/106 (05/14 0453) SpO2:  [86 %-92 %] 86 % (05/14 0453) Last BM Date: 10/17/19  Intake/Output from previous day: 05/13 0701 - 05/14 0700 In: 143.9 [P.O.:120; I.V.:23.9] Out: 300 [Urine:300] Intake/Output this shift: No intake/output data recorded.  GI: soft, non-tender; bowel sounds normal; no masses,  no organomegaly and inc c/d/i  Lab Results:  Recent Labs    10/18/19 0159  WBC 6.5  HGB 12.9*  HCT 40.5  PLT 170   Assessment/Plan: s/p Procedure(s): XI ROBOTIC ASSISTED INCISIONAL HERNIA REPAIR WITH MESH (N/A) Insertion Of Mesh (N/A) con't to amb  Mobilize Await some bowel function Hopefully home Saturday    LOS: 0 days    Ralene Ok 10/19/2019

## 2019-10-19 NOTE — Progress Notes (Signed)
Paged on call MD about pt. B/P of 152/106. Received a call back and was told to make sure the morning team knew and addressed B/P issues. No new orders given at this time. Will endorse to day shift RN. Will continue to monitor pt.

## 2019-10-19 NOTE — Progress Notes (Signed)
Patient sitting on side of bed. Less wheezing at present. Lasix 20 mg given IV earlier. Patient has voided x1. Reminded to use the urinal. Will continue to monitor.

## 2019-10-19 NOTE — Plan of Care (Signed)
  Problem: Education: Goal: Knowledge of General Education information will improve Description Including pain rating scale, medication(s)/side effects and non-pharmacologic comfort measures Outcome: Progressing   

## 2019-10-19 NOTE — Progress Notes (Signed)
Paged on call MD about pt. B/P of 152/104. Received a call back and was told to give 5mg  of Amlodipine. Will administer and continue to monitor.

## 2019-10-20 ENCOUNTER — Observation Stay (HOSPITAL_COMMUNITY): Payer: Medicare Other

## 2019-10-20 DIAGNOSIS — N400 Enlarged prostate without lower urinary tract symptoms: Secondary | ICD-10-CM | POA: Diagnosis present

## 2019-10-20 DIAGNOSIS — Z7982 Long term (current) use of aspirin: Secondary | ICD-10-CM | POA: Diagnosis not present

## 2019-10-20 DIAGNOSIS — Z9079 Acquired absence of other genital organ(s): Secondary | ICD-10-CM | POA: Diagnosis not present

## 2019-10-20 DIAGNOSIS — K5730 Diverticulosis of large intestine without perforation or abscess without bleeding: Secondary | ICD-10-CM | POA: Diagnosis not present

## 2019-10-20 DIAGNOSIS — Z823 Family history of stroke: Secondary | ICD-10-CM | POA: Diagnosis not present

## 2019-10-20 DIAGNOSIS — N281 Cyst of kidney, acquired: Secondary | ICD-10-CM | POA: Diagnosis not present

## 2019-10-20 DIAGNOSIS — M109 Gout, unspecified: Secondary | ICD-10-CM | POA: Diagnosis present

## 2019-10-20 DIAGNOSIS — E876 Hypokalemia: Secondary | ICD-10-CM | POA: Diagnosis not present

## 2019-10-20 DIAGNOSIS — K432 Incisional hernia without obstruction or gangrene: Secondary | ICD-10-CM | POA: Diagnosis present

## 2019-10-20 DIAGNOSIS — D631 Anemia in chronic kidney disease: Secondary | ICD-10-CM | POA: Diagnosis present

## 2019-10-20 DIAGNOSIS — Z833 Family history of diabetes mellitus: Secondary | ICD-10-CM | POA: Diagnosis not present

## 2019-10-20 DIAGNOSIS — E785 Hyperlipidemia, unspecified: Secondary | ICD-10-CM | POA: Diagnosis not present

## 2019-10-20 DIAGNOSIS — R739 Hyperglycemia, unspecified: Secondary | ICD-10-CM | POA: Diagnosis not present

## 2019-10-20 DIAGNOSIS — K660 Peritoneal adhesions (postprocedural) (postinfection): Secondary | ICD-10-CM | POA: Diagnosis present

## 2019-10-20 DIAGNOSIS — E860 Dehydration: Secondary | ICD-10-CM | POA: Diagnosis not present

## 2019-10-20 DIAGNOSIS — K567 Ileus, unspecified: Secondary | ICD-10-CM | POA: Diagnosis not present

## 2019-10-20 DIAGNOSIS — E7800 Pure hypercholesterolemia, unspecified: Secondary | ICD-10-CM | POA: Diagnosis present

## 2019-10-20 DIAGNOSIS — J9811 Atelectasis: Secondary | ICD-10-CM | POA: Diagnosis not present

## 2019-10-20 DIAGNOSIS — I351 Nonrheumatic aortic (valve) insufficiency: Secondary | ICD-10-CM | POA: Diagnosis present

## 2019-10-20 DIAGNOSIS — I129 Hypertensive chronic kidney disease with stage 1 through stage 4 chronic kidney disease, or unspecified chronic kidney disease: Secondary | ICD-10-CM | POA: Diagnosis not present

## 2019-10-20 DIAGNOSIS — M1990 Unspecified osteoarthritis, unspecified site: Secondary | ICD-10-CM | POA: Diagnosis present

## 2019-10-20 DIAGNOSIS — Z9081 Acquired absence of spleen: Secondary | ICD-10-CM | POA: Diagnosis not present

## 2019-10-20 DIAGNOSIS — I9589 Other hypotension: Secondary | ICD-10-CM | POA: Diagnosis not present

## 2019-10-20 DIAGNOSIS — N170 Acute kidney failure with tubular necrosis: Secondary | ICD-10-CM | POA: Diagnosis not present

## 2019-10-20 DIAGNOSIS — N183 Chronic kidney disease, stage 3 unspecified: Secondary | ICD-10-CM | POA: Diagnosis not present

## 2019-10-20 LAB — BASIC METABOLIC PANEL
Anion gap: 10 (ref 5–15)
BUN: 44 mg/dL — ABNORMAL HIGH (ref 8–23)
CO2: 28 mmol/L (ref 22–32)
Calcium: 8.6 mg/dL — ABNORMAL LOW (ref 8.9–10.3)
Chloride: 100 mmol/L (ref 98–111)
Creatinine, Ser: 3.83 mg/dL — ABNORMAL HIGH (ref 0.61–1.24)
GFR calc Af Amer: 17 mL/min — ABNORMAL LOW (ref >60)
GFR calc non Af Amer: 15 mL/min — ABNORMAL LOW (ref >60)
Glucose, Bld: 138 mg/dL — ABNORMAL HIGH (ref 70–99)
Potassium: 3.2 mmol/L — ABNORMAL LOW (ref 3.5–5.1)
Sodium: 138 mmol/L (ref 135–145)

## 2019-10-20 LAB — CBC
HCT: 42.7 % (ref 39.0–52.0)
Hemoglobin: 13.3 g/dL (ref 13.0–17.0)
MCH: 28.1 pg (ref 26.0–34.0)
MCHC: 31.1 g/dL (ref 30.0–36.0)
MCV: 90.1 fL (ref 80.0–100.0)
Platelets: 148 10*3/uL — ABNORMAL LOW (ref 150–400)
RBC: 4.74 MIL/uL (ref 4.22–5.81)
RDW: 15.1 % (ref 11.5–15.5)
WBC: 10.4 10*3/uL (ref 4.0–10.5)
nRBC: 0 % (ref 0.0–0.2)

## 2019-10-20 LAB — MAGNESIUM: Magnesium: 2.1 mg/dL (ref 1.7–2.4)

## 2019-10-20 MED ORDER — OXYCODONE HCL 5 MG PO TABS
5.0000 mg | ORAL_TABLET | ORAL | Status: DC | PRN
  Administered 2019-10-20 – 2019-10-22 (×3): 10 mg via ORAL
  Administered 2019-10-24 – 2019-10-25 (×2): 5 mg via ORAL
  Filled 2019-10-20: qty 2
  Filled 2019-10-20 (×2): qty 1
  Filled 2019-10-20 (×2): qty 2

## 2019-10-20 MED ORDER — LACTATED RINGERS IV SOLN: INTRAVENOUS | Status: DC

## 2019-10-20 MED ORDER — ENOXAPARIN SODIUM 30 MG/0.3ML ~~LOC~~ SOLN
30.0000 mg | SUBCUTANEOUS | Status: DC
  Administered 2019-10-21 – 2019-10-24 (×4): 30 mg via SUBCUTANEOUS
  Filled 2019-10-20 (×4): qty 0.3

## 2019-10-20 MED ORDER — ACETAMINOPHEN 500 MG PO TABS
1000.0000 mg | ORAL_TABLET | Freq: Three times a day (TID) | ORAL | Status: DC
  Administered 2019-10-20 – 2019-10-25 (×14): 1000 mg via ORAL
  Filled 2019-10-20 (×16): qty 2

## 2019-10-20 NOTE — Progress Notes (Signed)
Rad called regarding cxr.  Low lung volumes. Trace luceny under diaphragm Some air space dis  On rounds this am, his exam not c/w intestinal perf  Cont is, added flutter valve Awaiting lab results that were ordered  Signed out to dr Bobbye Morton  If labs significantly abnml - could scan abdomen  Leighton Ruff. Redmond Pulling, MD, FACS General, Bariatric, & Minimally Invasive Surgery [facility], [place]

## 2019-10-20 NOTE — Progress Notes (Signed)
   10/20/19 0830  Assess: MEWS Score  Temp 97.7 F (36.5 C)  BP (!) 149/77  Pulse Rate (!) 118  Resp 20  SpO2 95 %  O2 Device Room Air  Assess: MEWS Score  MEWS Temp 0  MEWS Systolic 0  MEWS Pulse 2  MEWS RR 0  MEWS LOC 0  MEWS Score 2  MEWS Score Color Yellow  Assess: if the MEWS score is Yellow or Red  Were vital signs taken at a resting state? Yes  Focused Assessment Documented focused assessment  Early Detection of Sepsis Score *See Row Information* Low  MEWS guidelines implemented *See Row Information* No, previously yellow, continue vital signs every 4 hours  Treat  MEWS Interventions Administered scheduled meds/treatments  Take Vital Signs  Increase Vital Sign Frequency  Yellow: Q 2hr X 2 then Q 4hr X 2, if remains yellow, continue Q 4hrs (remains q4h VS)  Document  Patient Outcome Stabilized after interventions  Progress note created (see row info) Yes    Patient started on MEWS protocol overnight. Upon checking his vitals patient is a yellow as previous. Will continue Q4h VS. NT updated to plan.

## 2019-10-20 NOTE — Plan of Care (Signed)
  Problem: Education: Goal: Knowledge of General Education information will improve Description: Including pain rating scale, medication(s)/side effects and non-pharmacologic comfort measures Outcome: Progressing   Problem: Health Behavior/Discharge Planning: Goal: Ability to manage health-related needs will improve Outcome: Progressing   Problem: Clinical Measurements: Goal: Ability to maintain clinical measurements within normal limits will improve Outcome: Progressing Goal: Will remain free from infection Outcome: Progressing Goal: Diagnostic test results will improve Outcome: Progressing Goal: Respiratory complications will improve Outcome: Progressing Goal: Cardiovascular complication will be avoided Outcome: Progressing   Problem: Activity: Goal: Risk for activity intolerance will decrease Outcome: Progressing   Problem: Nutrition: Goal: Adequate nutrition will be maintained Outcome: Progressing   Problem: Coping: Goal: Level of anxiety will decrease Outcome: Progressing   Problem: Elimination: Goal: Will not experience complications related to urinary retention Outcome: Progressing   Problem: Pain Managment: Goal: General experience of comfort will improve Outcome: Progressing   Problem: Safety: Goal: Ability to remain free from injury will improve Outcome: Progressing   Problem: Skin Integrity: Goal: Risk for impaired skin integrity will decrease Outcome: Progressing   Problem: Education: Goal: Required Educational Video(s) Outcome: Progressing   Problem: Clinical Measurements: Goal: Ability to maintain clinical measurements within normal limits will improve Outcome: Progressing Goal: Postoperative complications will be avoided or minimized Outcome: Progressing   Problem: Skin Integrity: Goal: Demonstration of wound healing without infection will improve Outcome: Progressing

## 2019-10-20 NOTE — Progress Notes (Signed)
Spoke with Dr. Bobbye Morton concerning plan of care. Received verbal order for LR at 14ml/hr. Patient updated on POC. Will continue to monitor.

## 2019-10-20 NOTE — Progress Notes (Signed)
Patient bladder scanned 474ml in bladder, attempting to void.

## 2019-10-20 NOTE — Progress Notes (Signed)
Patient is very pleasant. Alert and oriented X4. Retired Theatre stage manager, keeping himself busy with Psychologist, educational, stating he enjoys playing with numbers. Patient stated he has not voided since early this morning. States he sees a nephrologit every 6 months and his last creatine there was 2.3, if he remembers correctly. Attempted to void on his own twice without success. Per order, patient I&O cathed. 664ml output, documented. Patient resting in bed, no other needs voiced, call bell in reach.

## 2019-10-20 NOTE — Progress Notes (Signed)
   10/19/19 2215  Assess: MEWS Score  Temp (!) 100.7 F (38.2 C) (rn notified)  BP 132/87  Pulse Rate (!) 105 (rn notified)  Resp (!) 22  SpO2 95 %  O2 Device Nasal Cannula  O2 Flow Rate (L/min) 2 L/min  Assess: MEWS Score  MEWS Temp 1  MEWS Systolic 0  MEWS Pulse 1  MEWS RR 1  MEWS LOC 0  MEWS Score 3  MEWS Score Color Yellow  Assess: if the MEWS score is Yellow or Red  Were vital signs taken at a resting state? Yes  Focused Assessment Documented focused assessment  Early Detection of Sepsis Score *See Row Information* Low  MEWS guidelines implemented *See Row Information* Yes  Treat  MEWS Interventions Administered prn meds/treatments  Take Vital Signs  Increase Vital Sign Frequency  Yellow: Q 2hr X 2 then Q 4hr X 2, if remains yellow, continue Q 4hrs  Escalate  MEWS: Escalate Yellow: discuss with charge nurse/RN and consider discussing with provider and RRT  Notify: Charge Nurse/RN  Name of Charge Nurse/RN Notified No charge nurse  Notify: Provider  Provider Name/Title Brantley Stage, MD  Date Provider Notified 10/19/19  Time Provider Notified 2301  Notification Type Page  Notification Reason Change in status  Response See new orders  Date of Provider Response 10/19/19  Time of Provider Response 2303   Paged on call MD about pt. going into Yellow MEWS. Received an order for PRN Tylenol and Albuterol treatments for pt. wheezing. Will administer and continue to monitor. Yellow MEWS protocol started.

## 2019-10-20 NOTE — Progress Notes (Signed)
3 Days Post-Op   Subjective/Chief Complaint: Events of last night noted C/o b/l knee pain - like his gout pain. No calf pain No sob No n/v Walked  +flatus, no BM C/o hand shaking/tremors   Objective: Vital signs in last 24 hours: Temp:  [97.7 F (36.5 C)-100.7 F (38.2 C)] 97.7 F (36.5 C) (05/15 0830) Pulse Rate:  [94-118] 118 (05/15 0830) Resp:  [18-22] 20 (05/15 0830) BP: (117-149)/(77-98) 149/77 (05/15 0830) SpO2:  [91 %-96 %] 95 % (05/15 0830) Last BM Date: 10/15/19  Intake/Output from previous day: 05/14 0701 - 05/15 0700 In: 540 [P.O.:540] Out: 700 [Urine:700] Intake/Output this shift: Total I/O In: 240 [P.O.:240] Out: -   Alert, sitting on edge of bed Some mild audible wheezing but no wheezing on aucultation, non labored Mild tachy Soft, some mild distension, incisions ok. Min to NT No peripheral edema No redness around knee, not terribly tender either No calf pain/tenderness  Lab Results:  Recent Labs    10/18/19 0159  WBC 6.5  HGB 12.9*  HCT 40.5  PLT 170   BMET No results for input(s): NA, K, CL, CO2, GLUCOSE, BUN, CREATININE, CALCIUM in the last 72 hours. PT/INR No results for input(s): LABPROT, INR in the last 72 hours. ABG No results for input(s): PHART, HCO3 in the last 72 hours.  Invalid input(s): PCO2, PO2  Studies/Results: No results found.  Anti-infectives: Anti-infectives (From admission, onward)   Start     Dose/Rate Route Frequency Ordered Stop   10/17/19 1115  ceFAZolin (ANCEF) 3 g in dextrose 5 % 50 mL IVPB     3 g 100 mL/hr over 30 Minutes Intravenous On call to O.R. 10/17/19 1110 10/17/19 1329      Assessment/Plan: s/p Procedure(s): XI ROBOTIC ASSISTED INCISIONAL HERNIA REPAIR WITH MESH (N/A) Insertion Of Mesh (N/A) By Dr Rosendo Gros  Check labs  Check cxr Doesn't nec seem fluid overloaded Diet as tolerated On home gout meds Ambulate, pulm toilet Doubt dc today  Leighton Ruff. Redmond Pulling, MD, FACS General, Bariatric, &  Minimally Invasive Surgery [facility], [place]   LOS: 0 days    Greer Pickerel 10/20/2019

## 2019-10-20 NOTE — Progress Notes (Signed)
Contacted Dr Bobbye Morton per Dr Dois Davenport request with lab results. Provider did not place any new orders at this time.

## 2019-10-20 NOTE — Progress Notes (Signed)
Patient resting in bed. VSS. Abdomen taught and distended, patient denies passing any gas. Labs reviewed. Noted Creatinine has almost doubled since 5/7 2.07 and today 3.83. Potassium 3.2. On call for CCS paged, per secretary on line, Dr. Bobbye Morton is on call. Awaiting response.

## 2019-10-21 ENCOUNTER — Inpatient Hospital Stay (HOSPITAL_COMMUNITY): Payer: Medicare Other

## 2019-10-21 LAB — HEMOGLOBIN A1C
Hgb A1c MFr Bld: 6.2 % — ABNORMAL HIGH (ref 4.8–5.6)
Mean Plasma Glucose: 131.24 mg/dL

## 2019-10-21 LAB — BASIC METABOLIC PANEL
Anion gap: 10 (ref 5–15)
BUN: 54 mg/dL — ABNORMAL HIGH (ref 8–23)
CO2: 31 mmol/L (ref 22–32)
Calcium: 8.6 mg/dL — ABNORMAL LOW (ref 8.9–10.3)
Chloride: 97 mmol/L — ABNORMAL LOW (ref 98–111)
Creatinine, Ser: 4 mg/dL — ABNORMAL HIGH (ref 0.61–1.24)
GFR calc Af Amer: 17 mL/min — ABNORMAL LOW (ref >60)
GFR calc non Af Amer: 14 mL/min — ABNORMAL LOW (ref >60)
Glucose, Bld: 140 mg/dL — ABNORMAL HIGH (ref 70–99)
Potassium: 3.6 mmol/L (ref 3.5–5.1)
Sodium: 138 mmol/L (ref 135–145)

## 2019-10-21 LAB — URINALYSIS, ROUTINE W REFLEX MICROSCOPIC
Bacteria, UA: NONE SEEN
Bilirubin Urine: NEGATIVE
Glucose, UA: NEGATIVE mg/dL
Ketones, ur: NEGATIVE mg/dL
Leukocytes,Ua: NEGATIVE
Nitrite: NEGATIVE
Protein, ur: NEGATIVE mg/dL
Specific Gravity, Urine: 1.016 (ref 1.005–1.030)
pH: 5 (ref 5.0–8.0)

## 2019-10-21 LAB — PROTEIN / CREATININE RATIO, URINE
Creatinine, Urine: 240.03 mg/dL
Protein Creatinine Ratio: 0.24 mg/mg{Cre} — ABNORMAL HIGH (ref 0.00–0.15)
Total Protein, Urine: 58 mg/dL

## 2019-10-21 LAB — MAGNESIUM: Magnesium: 2 mg/dL (ref 1.7–2.4)

## 2019-10-21 MED ORDER — CHLORHEXIDINE GLUCONATE CLOTH 2 % EX PADS
6.0000 | MEDICATED_PAD | Freq: Every day | CUTANEOUS | Status: DC
  Administered 2019-10-22 – 2019-10-24 (×3): 6 via TOPICAL

## 2019-10-21 MED ORDER — POTASSIUM CHLORIDE CRYS ER 20 MEQ PO TBCR
40.0000 meq | EXTENDED_RELEASE_TABLET | Freq: Once | ORAL | Status: AC
  Administered 2019-10-21: 40 meq via ORAL
  Filled 2019-10-21: qty 2

## 2019-10-21 MED ORDER — POLYETHYLENE GLYCOL 3350 17 G PO PACK
17.0000 g | PACK | Freq: Once | ORAL | Status: AC
  Administered 2019-10-21: 17 g via ORAL
  Filled 2019-10-21: qty 1

## 2019-10-21 NOTE — Consult Note (Signed)
[name] Admit Date: 10/17/2019 10/21/2019 [name] Requesting Physician:  Ralene Ok  Reason for Consult:  AKI HPI:  69 year old with chronic kidney disease, aortic regurgitation, anemia, BPH, hyperlipidemia, hypertension who presented for surgical hernia repair on 5/12.  The patient underwent surgery with repair of his hernia on 5/12.  During the surgery the patient required one-point liters of lactated Ringer's as well as 5825 mcg of phenylephrine.  Postop the patient did relatively well but it slowly moving valgus.  Attempted to increase ambulation with minimal benefit.  Today the patient has had progressive distention of his abdomen as well as subsequent pain.  The patient states that he has a lot of sharp pain in his abdomen and feels nauseated.  He is slightly confused and unable to tell me if he has had any vomiting in the last 24 hours.  He does think he had some flatus.  Labs are checked outpatient on 5/7 with creatinine being 2 at that time.  In general his creatinine has been 2.3-2.8 with fairly wide fluctuations.  Creatinine was not checked until 5/14 and was found to be 3.8.  Creatinine was 4 today.  The patient has numerous undocumented urines.  Intake has been poor because of abdominal pain and distention.  PMH Incudes: Past Medical History:  Diagnosis Date  . Anemia    normal Fe, nl B12, nl retic, nl EPO July '13  . Aortic regurgitation    Moderate AI 04/17/19 echo  . Blood transfusion without reported diagnosis   . BPH (benign prostatic hyperplasia)   . Chronic back pain   . Chronic kidney disease    CKD III, obstructive nephropathy  . Diverticulosis   . Dysuria   . Elevated PSA, greater than or equal to 20 ng/ml June '13   PSA 107  . Hemorrhoids, internal, with bleeding, prolapse 09/19/2014  . Hyperlipidemia 05/29/2014  . Hypertension   . Obstructive uropathy 11/27/2015  . Pre-diabetes    per patient "reduced sugar intake"  . Renal insufficiency    . Tuberculosis    h/o PPD +  . UTI (urinary tract infection)   . Vitamin D deficiency 09/13/2017       Creatinine, Ser (mg/dL)  Date Value  10/21/2019 4.00 (H)  10/20/2019 3.83 (H)  10/12/2019 2.07 (H)  09/18/2019 1.97 (H)  07/06/2019 2.03 (H)  04/04/2019 2.47 (H)  03/31/2019 3.42 (H)  03/10/2019 2.75 (H)  03/05/2019 2.45 (H)  02/13/2019 2.48 (H)  ] I/Os:  ROS  Balance of 12 systems is negative w/ exceptions as above  PMH  Past Medical History:  Diagnosis Date  . Anemia    normal Fe, nl B12, nl retic, nl EPO July '13  . Aortic regurgitation    Moderate AI 04/17/19 echo  . Blood transfusion without reported diagnosis   . BPH (benign prostatic hyperplasia)   . Chronic back pain   . Chronic kidney disease    CKD III, obstructive nephropathy  . Diverticulosis   . Dysuria   . Elevated PSA, greater than or equal to 20 ng/ml June '13   PSA 107  . Hemorrhoids, internal, with bleeding, prolapse 09/19/2014  . Hyperlipidemia 05/29/2014  . Hypertension   . Obstructive uropathy 11/27/2015  . Pre-diabetes    per patient "reduced sugar intake"  . Renal insufficiency   . Tuberculosis    h/o PPD +  . UTI (urinary tract infection)   . Vitamin D deficiency 09/13/2017   PSH  Past Surgical History:  Procedure Laterality Date  . CARPAL TUNNEL RELEASE Right   . COLONOSCOPY    . HEMORRHOID BANDING    . INSERTION OF MESH N/A 10/17/2019   Procedure: Insertion Of Mesh;  Surgeon: Ralene Ok, MD;  Location: [facility];  Service: General;  Laterality: N/A;  . SPLENECTOMY    . XI ROBOTIC ASSISTED SIMPLE PROSTATECTOMY N/A 03/09/2019   Procedure: XI ROBOTIC ASSISTED SIMPLE PROSTATECTOMY;  Surgeon: Cleon Gustin, MD;  Location: [facility];  Service: Urology;  Laterality: N/A;  . XI ROBOTIC ASSISTED VENTRAL HERNIA N/A 10/17/2019   Procedure: XI ROBOTIC ASSISTED INCISIONAL HERNIA REPAIR WITH MESH;  Surgeon: Ralene Ok, MD;  Location: [facility];  Service: General;  Laterality: N/A;    FH  Family History  Problem Relation Age of Onset  . Diabetes Mother   . Stroke Brother   . Hearing loss Brother   . Colon cancer Neg Hx   . Rectal cancer Neg Hx   . Stomach cancer Neg Hx   . Esophageal cancer Neg Hx    SH  reports that he quit smoking about 39 years ago. His smoking use included cigarettes. He has never used smokeless tobacco. He reports that he does not drink alcohol or use drugs. Allergies No Known Allergies Home medications Prior to Admission medications   Medication Sig Start Date End Date Taking? Authorizing Provider  acetaminophen (TYLENOL) 500 MG tablet Take 1,000 mg by mouth every 6 (six) hours as needed for moderate pain.    Yes [provider]  allopurinol (ZYLOPRIM) 100 MG tablet Take 2 tablets by mouth once daily Patient taking differently: Take 200 mg by mouth daily.  08/06/19  Yes Biagio Borg, MD  amLODipine (NORVASC) 5 MG tablet Take 1 tablet (5 mg total) by mouth daily. 04/06/19  Yes Nahser, Wonda Cheng, MD  aspirin EC 81 MG tablet Take 81 mg by mouth daily.   Yes [provider]  atorvastatin (LIPITOR) 40 MG tablet TAKE 1 TABLET BY MOUTH ONCE DAILY **  KEEP  FOLLOW  UP  APPOINTMENT  IN  [facility]  FOR  FUTURE  REFILLS** Patient taking differently: Take 40 mg by mouth every evening.  09/03/19  Yes Biagio Borg, MD  cholecalciferol (VITAMIN D3) 25 MCG (1000 UNIT) tablet Take 1,000 Units by mouth daily.   Yes [provider]  colchicine 0.6 MG tablet Take 1 tablet by mouth once daily Patient taking differently: Take 0.6 mg by mouth daily.  04/11/19  Yes Biagio Borg, MD  ferrous sulfate 325 (65 FE) MG tablet Take 325 mg by mouth daily with breakfast.   Yes [provider]  hydroxypropyl methylcellulose / hypromellose (ISOPTO TEARS / GONIOVISC) 2.5 % ophthalmic solution Place 1-2 drops into both eyes 3 (three) times daily as needed (dry/irritated eyes.).   Yes [provider]  metoprolol tartrate (LOPRESSOR) 25 MG  tablet TAKE 1 TABLET BY MOUTH TWICE DAILY **KEEP  FOLLOW  UP  APPOINTMENT  IN  [facility]  FOR  FURTHER  REFILLS** 10/17/19  Yes Biagio Borg, MD  Multiple Vitamin (MULTIVITAMIN WITH MINERALS) TABS tablet Take 1 tablet by mouth daily.   Yes [provider]  Potassium 99 MG TABS Take 99 mg by mouth daily.    Yes [provider]  cephALEXin (KEFLEX) 500 MG capsule Take 1 capsule (500 mg total) by mouth 2 (two) times daily. 2 caps po bid x 7 days Patient not taking: Reported on 10/08/2019 09/18/19   Ripley Fraise,  MD  finasteride (PROSCAR) 5 MG tablet Take 1 tablet by mouth once daily Patient not taking: Reported on 10/08/2019 05/01/19   Biagio Borg, MD  HYDROcodone-acetaminophen (NORCO/VICODIN) 5-325 MG tablet Take 1 tablet by mouth every 6 (six) hours as needed for severe pain. Patient not taking: Reported on 10/08/2019 09/18/19   Ripley Fraise, MD  predniSONE (DELTASONE) 50 MG tablet 1 tablet PO QD X4 days 09/18/19   Ripley Fraise, MD  Hyoscyamine Sulfate SL (LEVSIN/SL) 0.125 MG SUBL Place 0.125 mg under the tongue every 6 (six) hours as needed (bladder spasm). Patient not taking: Reported on 07/06/2019 02/11/19 09/18/19  Irine Seal, MD    Current Medications Scheduled Meds: . acetaminophen  1,000 mg Oral Q8H  . allopurinol  200 mg Oral Daily  . amLODipine  5 mg Oral Daily  . colchicine  0.6 mg Oral Daily  . enoxaparin (LOVENOX) injection  30 mg Subcutaneous Q24H  . gabapentin  300 mg Oral BID  . metoprolol tartrate  25 mg Oral Daily   Continuous Infusions: . dextrose 5 % and 0.9% NaCl 10 mL/hr at 10/20/19 0951  . lactated ringers 125 mL/hr at 10/21/19 0508   PRN Meds:.bisacodyl, diazepam, HYDROmorphone (DILAUDID) injection, hydroxypropyl methylcellulose / hypromellose, ipratropium-albuterol, oxyCODONE, traMADol  CBC Recent Labs  Lab 10/18/19 0159 10/20/19 1139  WBC 6.5 10.4  HGB 12.9* 13.3  HCT 40.5 42.7  MCV 89.0 90.1  PLT 170 [id number]*   Basic Metabolic  Panel Recent Labs  Lab 10/20/19 1139 10/21/19 0956  NA 138 138  K 3.2* 3.6  CL 100 97*  CO2 28 31  GLUCOSE 138* 140*  BUN 44* 54*  CREATININE 3.83* 4.00*  CALCIUM 8.6* 8.6*    Physical Exam  Blood pressure 112/82, pulse 64, temperature 98.8 F (37.1 C), temperature source Oral, resp. rate 20, height 6\' 1"  (1.854 m), weight 122.5 kg, SpO2 96 %. GEN: Sitting in bed, tired, no distress ENT: no nasal discharge, mmm EYES: no scleral icterus, eomi CV: normal rate, no obvious murmurs PULM: no iwob, bilateral chest rise ABD: Minimal bowel sounds, marked distention, pain with palpation SKIN: no rashes or jaundice EXT: no edema, warm and well perfused   Assessment 69 year old with chronic kidney disease, aortic regurgitation, anemia, BPH, hyperlipidemia, hypertension now status post surgical hernia repair consult for AKI  Plan 1. AKI on CKD: Baseline creatinine 2.3-2.8.  Now with creatinine of 4.  No labs on day of surgery or day after.  Urine output seems to be reassuring with many undocumented urine outputs.  Most likely ATN secondary to hypotension during surgery.  Obstruction secondary to BPH is also possible.  Lastly abdominal compartment syndrome possible but unlikely.  Will perform further work-up 1. Continue with IV hydration while not taking p.o. 2. Follow-up CT abdomen pelvis looking for hydronephrosis 3. Monitor renal function panel daily 4. UPC and urinalysis 5. Could consider obtaining bladder pressure 6. Monitor ins and outs 2. Gout: hold colchicine given decreased GFR. Could partially explain somnolence but receiving other sedating medications as well. Can continue allopurinol 3. HTN: borderline low blood pressures. Can continue amlodipine 5 mg daily for now.  Stop metoprolol. 4. Abdominal distention concerning for ileus: CT abdomen pelvis today.  Continue IV hydration while n.p.o.  Defer management to surgical team. 5. Hypokalemia: Mild decreased to 3.2.  Repletion per  primary team.  IV lactated Ringer's probably sufficient. 6. History of anemia: Globin normal at this time.  Can hold home iron 7. Hyperglycemia: Consider sliding scale  insulin.  Hemoglobin A1c added on.  Previously normal. 8. Hyperlipidemia: Holding home aspirin and statin given difficulty with p.o. can restart when able 9. BPH: CT abdomen pelvis assessing for hydronephrosis today.  Would restart home Proscar when able.  Plans communicated to the primary team   [name]  [id number] pgr 10/21/2019, 12:26 PM

## 2019-10-21 NOTE — Progress Notes (Addendum)
Patient has made multiple attempts at voiding his bladder. Other than a small amount of urine this morning while sitting on the toilet he has had no output. Bladder scanned patient and obtained reading of 652mL at the highest. Patient instructed that he will be in and out catheterized and welcomes this decision. Patient states he knows he needs to urinate but nothing comes out. He feels distended from his belly but not from his bladder and does not feel he has to urinate. However, he realizes that with the fluid he is receiving via IV and the water he's drinking he should be urinating. Will commence with catherization and alert MD for further instructions.  Dr Posey Pronto with nephrology recommends foley catheter. Patient aware and agreeable with plan. Will place foley catheter and monitor.

## 2019-10-21 NOTE — Progress Notes (Signed)
Second bottle of contrast started.

## 2019-10-21 NOTE — Progress Notes (Signed)
First bottle of contrast started. Patient instructed as to timeline and when bottle should be completed. Patient agrees to plan.

## 2019-10-21 NOTE — Progress Notes (Addendum)
4 Days Post-Op   Subjective/Chief Complaint: Events of last night noted No sob No n/v Walked  +flatus, no BM Feels bloated Required I/o  Objective: Vital signs in last 24 hours: Temp:  [98.8 F (37.1 C)-99.8 F (37.7 C)] 98.8 F (37.1 C) (05/16 0529) Pulse Rate:  [64-115] 64 (05/16 0535) Resp:  [18-20] 20 (05/16 0529) BP: (112-136)/(80-98) 112/82 (05/16 0529) SpO2:  [95 %-96 %] 96 % (05/16 0529) Last BM Date: 10/16/19  Intake/Output from previous day: 05/15 0701 - 05/16 0700 In: 1801.5 [P.O.:720; I.V.:1081.5] Out: 650 [Urine:650] Intake/Output this shift: Total I/O In: 706 [I.V.:706] Out: -   Alert, sitting on edge of bed Some mild audible wheezing but no wheezing on aucultation, non labored Mild tachy Soft, bloated, incisions ok. Min to NT No peripheral edema No redness around knee, not terribly tender either No calf pain/tenderness  Lab Results:  Recent Labs    10/20/19 1139  WBC 10.4  HGB 13.3  HCT 42.7  PLT 148*   BMET Recent Labs    10/20/19 1139  NA 138  K 3.2*  CL 100  CO2 28  GLUCOSE 138*  BUN 44*  CREATININE 3.83*  CALCIUM 8.6*   PT/INR No results for input(s): LABPROT, INR in the last 72 hours. ABG No results for input(s): PHART, HCO3 in the last 72 hours.  Invalid input(s): PCO2, PO2  Studies/Results: DG Chest 2 View  Addendum Date: 10/20/2019   ADDENDUM REPORT: 10/20/2019 11:59 ADDENDUM: Presence of distended bowel loops also noted in the upper abdomen. This and the possibility of free air was discussed with the referring provider as outlined below. These results were called by telephone at the time of interpretation on 10/20/2019 at 11:59 am to provider Haydee Jabbour , who verbally acknowledged these results. Electronically Signed   By: Zetta Bills M.D.   On: 10/20/2019 11:59   Result Date: 10/20/2019 CLINICAL DATA:  Wheezing EXAM: CHEST - 2 VIEW COMPARISON:  11/04/2018 FINDINGS: Cardiomediastinal contours accentuated by low lung  volumes. Patchy linear opacities at the lung bases. Question of lucency beneath the RIGHT hemidiaphragm. No sign of pleural effusion. Increased density overlying the spine on the lateral view. No acute bone process. IMPRESSION: 1. Low lung volumes with basilar atelectasis. Infection difficult to exclude in this context. 2. Possible free air beneath the RIGHT hemidiaphragm, this could be airspace disease overlying the RIGHT hemidiaphragm; however, decubitus abdominal radiograph may be helpful or CT as warranted. 3. No signs of pleural effusion or pneumothorax. These results will be called to the ordering clinician or representative by the Radiologist Assistant, and communication documented in the PACS or [employer]. Electronically Signed: By: Zetta Bills M.D. On: 10/20/2019 11:51    Anti-infectives: Anti-infectives (From admission, onward)   Start     Dose/Rate Route Frequency Ordered Stop   10/17/19 1115  ceFAZolin (ANCEF) 3 g in dextrose 5 % 50 mL IVPB     3 g 100 mL/hr over 30 Minutes Intravenous On call to O.R. 10/17/19 1110 10/17/19 1329      Assessment/Plan: s/p Procedure(s): XI ROBOTIC ASSISTED INCISIONAL HERNIA REPAIR WITH MESH (N/A) Insertion Of Mesh (N/A) By Dr Rosendo Gros CKD   Naybe developing an ileus - bloated. Flatus but no BM. Will give dose of miralax  CKD - monitor uop. I/o as needed. Labs pending this am. Baseline Cr appears to be around 2. Started back on iVF last pm. Cr yesterday 3.83. if cont to increase will consult renal  Not ready for  dc  Hypokalemia - replace potassium  Updated - Cr up to 4, bun updated, AKI on CKD.  Called Dr Joylene Grapes with renal for consult  Will also CT abd/pelvis with ORAL contrast since not progressing, question of free air on xray, and decline in renal function to make sure not missing an intra-abdominal process  Leighton Ruff. Redmond Pulling, MD, FACS General, Bariatric, & Minimally Invasive Surgery [facility], [place]   LOS: 1 day     Greer Pickerel 10/21/2019

## 2019-10-22 LAB — RENAL FUNCTION PANEL
Albumin: 2.7 g/dL — ABNORMAL LOW (ref 3.5–5.0)
Anion gap: 10 (ref 5–15)
BUN: 54 mg/dL — ABNORMAL HIGH (ref 8–23)
CO2: 28 mmol/L (ref 22–32)
Calcium: 8.6 mg/dL — ABNORMAL LOW (ref 8.9–10.3)
Chloride: 99 mmol/L (ref 98–111)
Creatinine, Ser: 3.31 mg/dL — ABNORMAL HIGH (ref 0.61–1.24)
GFR calc Af Amer: 21 mL/min — ABNORMAL LOW (ref >60)
GFR calc non Af Amer: 18 mL/min — ABNORMAL LOW (ref >60)
Glucose, Bld: 116 mg/dL — ABNORMAL HIGH (ref 70–99)
Phosphorus: 3.4 mg/dL (ref 2.5–4.6)
Potassium: 3.3 mmol/L — ABNORMAL LOW (ref 3.5–5.1)
Sodium: 137 mmol/L (ref 135–145)

## 2019-10-22 MED ORDER — TAMSULOSIN HCL 0.4 MG PO CAPS
0.4000 mg | ORAL_CAPSULE | Freq: Every day | ORAL | Status: DC
  Administered 2019-10-22 – 2019-10-25 (×4): 0.4 mg via ORAL
  Filled 2019-10-22 (×4): qty 1

## 2019-10-22 MED ORDER — FUROSEMIDE 10 MG/ML IJ SOLN
40.0000 mg | Freq: Once | INTRAMUSCULAR | Status: AC
  Administered 2019-10-22: 40 mg via INTRAVENOUS
  Filled 2019-10-22: qty 4

## 2019-10-22 MED ORDER — POTASSIUM CHLORIDE 20 MEQ PO PACK
40.0000 meq | PACK | Freq: Once | ORAL | Status: AC
  Administered 2019-10-22: 40 meq via ORAL
  Filled 2019-10-22: qty 2

## 2019-10-22 MED ORDER — BISACODYL 10 MG RE SUPP
10.0000 mg | Freq: Once | RECTAL | Status: AC
  Administered 2019-10-22: 10 mg via RECTAL
  Filled 2019-10-22: qty 1

## 2019-10-22 MED ORDER — METOCLOPRAMIDE HCL 5 MG/ML IJ SOLN
10.0000 mg | Freq: Four times a day (QID) | INTRAMUSCULAR | Status: AC
  Administered 2019-10-22 – 2019-10-23 (×6): 10 mg via INTRAVENOUS
  Filled 2019-10-22 (×7): qty 2

## 2019-10-22 NOTE — Plan of Care (Signed)
Abdomen remains very distended, bloated patient reports small liquid BM prior to giving suppository this AM.  Encourage ambulation and IS. Will continue to monitor

## 2019-10-22 NOTE — Progress Notes (Signed)
5 Days Post-Op   Subjective/Chief Complaint: Weekend events noted CT reviewed Pt with con't flatus and no BMs C/o bloating Tol some PO   Objective: Vital signs in last 24 hours: Temp:  [97.9 F (36.6 C)-98.4 F (36.9 C)] 97.9 F (36.6 C) (05/17 0354) Pulse Rate:  [93-109] 93 (05/17 0354) Resp:  [14-18] 16 (05/17 0354) BP: (139-154)/(89-97) 154/89 (05/17 0354) SpO2:  [95 %-98 %] 98 % (05/17 0354) Last BM Date: 10/16/19  Intake/Output from previous day: 05/16 0701 - 05/17 0700 In: 4440.3 [P.O.:1570; I.V.:2870.3] Out: 1350 [Urine:1350] Intake/Output this shift: No intake/output data recorded.   Constitutional: No acute distress, conversant, appears states age. Eyes: Anicteric sclerae, moist conjunctiva, no lid lag Lungs: Clear to auscultation bilaterally, normal respiratory effort CV: regular rate and rhythm, no murmurs, no peripheral edema, pedal pulses 2+ GI: Soft, no masses or hepatosplenomegaly, distended, approp ttp Skin: No rashes, palpation reveals normal turgor Psychiatric: appropriate judgment and insight, oriented to person, place, and time    Lab Results:  Recent Labs    10/20/19 1139  WBC 10.4  HGB 13.3  HCT 42.7  PLT 148*   BMET Recent Labs    10/21/19 0956 10/22/19 0551  NA 138 137  K 3.6 3.3*  CL 97* 99  CO2 31 28  GLUCOSE 140* 116*  BUN 54* 54*  CREATININE 4.00* 3.31*  CALCIUM 8.6* 8.6*  Studies/Results: CT ABDOMEN PELVIS WO CONTRAST  Result Date: 10/21/2019 CLINICAL DATA:  Postop Day #4, robotic TAR hernia repair EXAM: CT ABDOMEN AND PELVIS WITHOUT CONTRAST TECHNIQUE: Multidetector CT imaging of the abdomen and pelvis was performed following the standard protocol without IV contrast. COMPARISON:  CT 07/13/2019 FINDINGS: Lower chest: Dependent areas of atelectasis more bandlike opacities likely reflecting further subsegmental atelectasis in the postoperative state. Cardiac size at the upper limits normal. No pericardial effusion.  Hepatobiliary: Previously seen hypoattenuating lesions towards the liver dome appear quite subtle on today's examination, but are grossly unchanged in size again measuring 2.2 cm anteriorly and 2 cm just posterolateral (3/14). No new visible liver lesions. Normal gallbladder. No biliary dilatation. Pancreas: Unremarkable. No pancreatic ductal dilatation or surrounding inflammatory changes. Spleen: Prior splenectomy Adrenals/Urinary Tract: No concerning adrenal nodules. The right kidney appears markedly atrophic with some small subcentimeter hypoattenuating foci possibly cysts. Areas of cortical scarring are present in the left kidney as well with few hypoattenuating regions possibly cyst versus calyceal diverticuli largest in the lower pole measuring 2.2 cm, not significantly changed from most recent comparison. No urolithiasis or hydronephrosis. Minimal bladder thickening. Stomach/Bowel: Fairly pronounced dilatation of the stomach with a mixture of ingested material and contrast media. Contrast traverses part way through the small bowel without evidence of high-grade small bowel obstruction. No small bowel thickening or dilatation. Proximal colon displaced some formed stool while much of the distal colon is air distended but without significant wall thickening or pericolonic inflammation. Scattered colonic diverticula without focal pericolonic inflammation to suggest diverticulitis. Vascular/Lymphatic: Atherosclerotic calcifications throughout the abdominal aorta and branch vessels. No aortic aneurysm or ectasia. 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 No enlarged abdominopelvic lymph nodes. Reproductive: EMR indicates a history of prior prostatectomy. Seminal vesicles appear retained and are unremarkable. Other: There are postsurgical changes along the anterior abdominal wall corresponding to the previously seen ventral hernia. There is marked low attenuation  thickening along the anterior fascia which could reflect some postoperative seroma. A small bilobed outpouching is seen along the inferior portion  of this surgical site measuring up to 4.4 x 9.4 x 8.5 cm in size containing a mixture of this low-attenuation fluid and slightly more high attenuation material which could reflect trace hematoma versus surgical material (3/64). Just superiorly is a second area potential fascial defect containing a small amount of fluid and gas (3/48) measures 3.5 x 3.8 x 2.8 cm. Likely a fat containing hernia with some distributed fluid from the collection detailed above. Additional mild edema noted in the hips. Few punctate foci of intraperitoneal air as well as gas tracking along the fascial planes to the tunica vaginalis and into the inguinal canals, likely related to surgery. Small focus of lobular stranding anterior to the colon (3/55) likely omental fat infarct. Musculoskeletal: Multilevel degenerative changes are present in the imaged portions of the spine. Sclerotic changes across the L4-5 disc space are similar to prior. No new suspicious lytic or blastic lesions. IMPRESSION: 1. Extensive postsurgical changes along the anterior abdominal wall which likely correspond with the site of abdominal hernia repair. There is diffuse fluid thickening along the anterior fascia which may reflect some seroma or postoperative fluid. Fairly large bilobed collection is seen anteriorly along the the inferior extent fat and mixed attenuation fluid collection possibly reflecting a seroma and/or hematoma versus surgical material within the collection. A second small anterior outpouching is seen along the superior margin containing fat, fluid and a small amount of gas. No bowel containing hernia. 2. Small amount of free intraperitoneal air as well as gas tracking along the tunica vaginalis is likely postoperative in nature. 3. Lobular region of focal fat stranding anterior to the colon near the  operative site may reflect focal fat necrosis of the omentum. 4. No bowel containing hernia or evidence of small-bowel obstruction. Minimal air distention of the colon is nonspecific. No evidence of mechanical colonic obstruction. 5. Colonic diverticulosis without evidence of diverticulitis. 6. Previously seen hypoattenuating lesions towards the liver dome appear quite subtle on today's examination, but are grossly unchanged in size again measuring 2.2 cm anteriorly and 2 cm just posterolateral. 7. Stable renal cysts versus dilated calices. 8. Prior splenectomy. 9. 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 10.  Aortic Atherosclerosis (ICD10-I70.0). Electronically Signed   By: Lovena Le M.D.   On: 10/21/2019 15:31   DG Chest 2 View  Addendum Date: 10/20/2019   ADDENDUM REPORT: 10/20/2019 11:59 ADDENDUM: Presence of distended bowel loops also noted in the upper abdomen. This and the possibility of free air was discussed with the referring provider as outlined below. These results were called by telephone at the time of interpretation on 10/20/2019 at 11:59 am to provider ERIC WILSON , who verbally acknowledged these results. Electronically Signed   By: Zetta Bills M.D.   On: 10/20/2019 11:59   Result Date: 10/20/2019 CLINICAL DATA:  Wheezing EXAM: CHEST - 2 VIEW COMPARISON:  11/04/2018 FINDINGS: Cardiomediastinal contours accentuated by low lung volumes. Patchy linear opacities at the lung bases. Question of lucency beneath the RIGHT hemidiaphragm. No sign of pleural effusion. Increased density overlying the spine on the lateral view. No acute bone process. IMPRESSION: 1. Low lung volumes with basilar atelectasis. Infection difficult to exclude in this context. 2. Possible free air beneath the RIGHT hemidiaphragm, this could be airspace disease overlying the RIGHT hemidiaphragm; however, decubitus abdominal radiograph may be helpful or CT as warranted. 3. No signs  of pleural effusion or pneumothorax. These results will be called to the ordering  clinician or representative by the Radiologist Assistant, and communication documented in the PACS or [employer]. Electronically Signed: By: Zetta Bills M.D. On: 10/20/2019 11:51   Assessment/Plan: s/p Procedure(s): XI ROBOTIC ASSISTED INCISIONAL HERNIA REPAIR WITH MESH (N/A) Insertion Of Mesh (N/A) Pt with ileus clinically CT with no significant bowel inflammation, some dilated loops Appreciate nephro help with AKI -replace K+ -suppository today and will start reglan -will start flomax, con't foley for UOP measurement and AKI -mobilize/pulm toilet    LOS: 2 days    Ralene Ok 10/22/2019

## 2019-10-22 NOTE — Progress Notes (Signed)
[facility] Progress Note   69 year old with chronic kidney disease, aortic regurgitation, anemia, BPH, hyperlipidemia, hypertension who presented for surgical hernia repair on 5/12 req a liter  of lactated Ringer's as well as  phenylephrine.   Patient has had progressive distention of his abdomen as well as subsequent pain.    Labs are checked outpatient on 5/7 with creatinine being 2 at that time.  In general his creatinine has been 2.3-2.8 with fairly wide fluctuations.  Creatinine was not checked until 5/14 and was found to be 3.8 but improving trend.  Assessment/ Plan:   1. AKI on CKD: Baseline creatinine 2.3-2.8.  Now with creatinine of 4.  No labs on day of surgery or day after.  Urine output seems to be reassuring with many undocumented urine outputs.  Most likely ATN secondary to hypotension during surgery.  Obstruction secondary to BPH is also possible.  Lastly abdominal compartment syndrome possible but unlikely.  Will perform further work-up 1. Follow-up CT abdomen pelvis looking for hydronephrosis -> cyst vs dilated calyces. But no obvious hydronephrosis or dilated ureters. He has a foley in place. 2. If renal function worsens then consider obtaining bladder pressure 3. Strict  ins and outs 4. Good UOP but he appears dyspneic and has edema -> will give a single dose of Lasix. 2. Gout: hold colchicine given decreased GFR. Could partially explain somnolence but receiving other sedating medications as well. Can continue allopurinol 3. HTN: borderline low blood pressures. Can continue amlodipine 5 mg daily for now.  Stop metoprolol. 4. Abdominal distention concerning for ileus: CT abdomen pelvis today.  Continue IV hydration while n.p.o.  Defer management to surgical team. 5. Hypokalemia: Mild decreased to 3.2.  Repletion per primary team.  IV lactated Ringer's probably sufficient. 6. History of anemia: Globin normal at this time.  Can hold home iron 7. Hyperglycemia:  Consider sliding scale insulin.  Hemoglobin A1c added on.  Previously normal. 8. Hyperlipidemia: Holding home aspirin and statin given difficulty with p.o. can restart when able 9. BPH: CT abdomen pelvis assessing for hydronephrosis 5/16 (?cyst vs dilated calyces).  Would restart home Proscar when able.  Subjective:   Mild shortness of breath, tolerating PO. Denies cp/ n/v but also feels more swollen.   Objective:   BP (!) 154/89 (BP Location: Right Arm)   Pulse 93   Temp 97.9 F (36.6 C) (Oral)   Resp 16   Ht 6\' 1"  (1.854 m)   Wt 122.5 kg   SpO2 98%   BMI 35.62 kg/m   Intake/Output Summary (Last 24 hours) at 10/22/2019 1258 Last data filed at 10/22/2019 1000 Gross per 24 hour  Intake 4254.65 ml  Output 1350 ml  Net 2904.65 ml   Weight change:   Physical Exam: GEN: Sitting in bed, tired, no distress ENT: no nasal discharge, mmm EYES: no scleral icterus, eomi CV: normal rate, no obvious murmurs PULM: no iwob, bilateral chest rise ABD: Minimal bowel sounds, marked distention, pain with palpation SKIN: no rashes or jaundice EXT: tr - 1+ edema, warm and well perfused  Imaging: CT ABDOMEN PELVIS WO CONTRAST  Result Date: 10/21/2019 CLINICAL DATA:  Postop Day #4, robotic TAR hernia repair EXAM: CT ABDOMEN AND PELVIS WITHOUT CONTRAST TECHNIQUE: Multidetector CT imaging of the abdomen and pelvis was performed following the standard protocol without IV contrast. COMPARISON:  CT 07/13/2019 FINDINGS: Lower chest: Dependent areas of atelectasis more bandlike opacities likely reflecting further subsegmental atelectasis in the postoperative state. Cardiac size at the  upper limits normal. No pericardial effusion. Hepatobiliary: Previously seen hypoattenuating lesions towards the liver dome appear quite subtle on today's examination, but are grossly unchanged in size again measuring 2.2 cm anteriorly and 2 cm just posterolateral (3/14). No new visible liver lesions. Normal gallbladder. No  biliary dilatation. Pancreas: Unremarkable. No pancreatic ductal dilatation or surrounding inflammatory changes. Spleen: Prior splenectomy Adrenals/Urinary Tract: No concerning adrenal nodules. The right kidney appears markedly atrophic with some small subcentimeter hypoattenuating foci possibly cysts. Areas of cortical scarring are present in the left kidney as well with few hypoattenuating regions possibly cyst versus calyceal diverticuli largest in the lower pole measuring 2.2 cm, not significantly changed from most recent comparison. No urolithiasis or hydronephrosis. Minimal bladder thickening. Stomach/Bowel: Fairly pronounced dilatation of the stomach with a mixture of ingested material and contrast media. Contrast traverses part way through the small bowel without evidence of high-grade small bowel obstruction. No small bowel thickening or dilatation. Proximal colon displaced some formed stool while much of the distal colon is air distended but without significant wall thickening or pericolonic inflammation. Scattered colonic diverticula without focal pericolonic inflammation to suggest diverticulitis. Vascular/Lymphatic: Atherosclerotic calcifications throughout the abdominal aorta and branch vessels. No aortic aneurysm or ectasia. 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 No enlarged abdominopelvic lymph nodes. Reproductive: EMR indicates a history of prior prostatectomy. Seminal vesicles appear retained and are unremarkable. Other: There are postsurgical changes along the anterior abdominal wall corresponding to the previously seen ventral hernia. There is marked low attenuation thickening along the anterior fascia which could reflect some postoperative seroma. A small bilobed outpouching is seen along the inferior portion of this surgical site measuring up to 4.4 x 9.4 x 8.5 cm in size containing a mixture of this low-attenuation fluid and slightly more high  attenuation material which could reflect trace hematoma versus surgical material (3/64). Just superiorly is a second area potential fascial defect containing a small amount of fluid and gas (3/48) measures 3.5 x 3.8 x 2.8 cm. Likely a fat containing hernia with some distributed fluid from the collection detailed above. Additional mild edema noted in the hips. Few punctate foci of intraperitoneal air as well as gas tracking along the fascial planes to the tunica vaginalis and into the inguinal canals, likely related to surgery. Small focus of lobular stranding anterior to the colon (3/55) likely omental fat infarct. Musculoskeletal: Multilevel degenerative changes are present in the imaged portions of the spine. Sclerotic changes across the L4-5 disc space are similar to prior. No new suspicious lytic or blastic lesions. IMPRESSION: 1. Extensive postsurgical changes along the anterior abdominal wall which likely correspond with the site of abdominal hernia repair. There is diffuse fluid thickening along the anterior fascia which may reflect some seroma or postoperative fluid. Fairly large bilobed collection is seen anteriorly along the the inferior extent fat and mixed attenuation fluid collection possibly reflecting a seroma and/or hematoma versus surgical material within the collection. A second small anterior outpouching is seen along the superior margin containing fat, fluid and a small amount of gas. No bowel containing hernia. 2. Small amount of free intraperitoneal air as well as gas tracking along the tunica vaginalis is likely postoperative in nature. 3. Lobular region of focal fat stranding anterior to the colon near the operative site may reflect focal fat necrosis of the omentum. 4. No bowel containing hernia or evidence of small-bowel obstruction. Minimal air distention of the colon is nonspecific. No evidence  of mechanical colonic obstruction. 5. Colonic diverticulosis without evidence of  diverticulitis. 6. Previously seen hypoattenuating lesions towards the liver dome appear quite subtle on today's examination, but are grossly unchanged in size again measuring 2.2 cm anteriorly and 2 cm just posterolateral. 7. Stable renal cysts versus dilated calices. 8. Prior splenectomy. 9. 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 10.  Aortic Atherosclerosis (ICD10-I70.0). Electronically Signed   By: Lovena Le M.D.   On: 10/21/2019 15:31    Labs: BMET Recent Labs  Lab 10/20/19 1139 10/21/19 0956 10/22/19 0551  NA 138 138 137  K 3.2* 3.6 3.3*  CL 100 97* 99  CO2 28 31 28   GLUCOSE 138* 140* 116*  BUN 44* 54* 54*  CREATININE 3.83* 4.00* 3.31*  CALCIUM 8.6* 8.6* 8.6*  PHOS  --   --  3.4   CBC Recent Labs  Lab 10/18/19 0159 10/20/19 1139  WBC 6.5 10.4  HGB 12.9* 13.3  HCT 40.5 42.7  MCV 89.0 90.1  PLT 170 148*    Medications:    . acetaminophen  1,000 mg Oral Q8H  . allopurinol  200 mg Oral Daily  . amLODipine  5 mg Oral Daily  . Chlorhexidine Gluconate Cloth  6 each Topical Daily  . enoxaparin (LOVENOX) injection  30 mg Subcutaneous Q24H  . gabapentin  300 mg Oral BID  . metoCLOPramide (REGLAN) injection  10 mg Intravenous Q6H  . tamsulosin  0.4 mg Oral QPC breakfast      Otelia Santee, MD 10/22/2019, 12:58 PM

## 2019-10-22 NOTE — Progress Notes (Signed)
Dr. Rosendo Gros called with concerns for patient having increased abdominal distention.  No reports of nausea or vomiting at this time, patient reporting abdominal tenderness upon palpation of abd.  Very faint bowel sounds auscultated BP (!) 137/95 (BP Location: Left Arm)   Pulse (!) 113   Temp 97.6 F (36.4 C) (Oral)   Resp (!) 25   Ht 6\' 1"  (1.854 m)   Wt 122.5 kg   SpO2 98%   BMI 35.62 kg/m  Patient is tachycardic at this time and having expiratory wheezing, breathing treatment requested.  Will cont to monitor closely

## 2019-10-22 NOTE — Plan of Care (Signed)
  Problem: Education: Goal: Knowledge of General Education information will improve Description: Including pain rating scale, medication(s)/side effects and non-pharmacologic comfort measures Outcome: Progressing   Problem: Health Behavior/Discharge Planning: Goal: Ability to manage health-related needs will improve Outcome: Progressing   Problem: Clinical Measurements: Goal: Ability to maintain clinical measurements within normal limits will improve Outcome: Progressing Goal: Will remain free from infection Outcome: Progressing Goal: Diagnostic test results will improve Outcome: Progressing Goal: Respiratory complications will improve Outcome: Progressing Goal: Cardiovascular complication will be avoided Outcome: Progressing   Problem: Activity: Goal: Risk for activity intolerance will decrease Outcome: Progressing   Problem: Nutrition: Goal: Adequate nutrition will be maintained Outcome: Progressing   Problem: Coping: Goal: Level of anxiety will decrease Outcome: Progressing   Problem: Pain Managment: Goal: General experience of comfort will improve Outcome: Progressing   Problem: Safety: Goal: Ability to remain free from injury will improve Outcome: Progressing   Problem: Skin Integrity: Goal: Risk for impaired skin integrity will decrease Outcome: Progressing   Problem: Education: Goal: Required Educational Video(s) Outcome: Progressing   Problem: Clinical Measurements: Goal: Ability to maintain clinical measurements within normal limits will improve Outcome: Progressing Goal: Postoperative complications will be avoided or minimized Outcome: Progressing   Problem: Skin Integrity: Goal: Demonstration of wound healing without infection will improve Outcome: Progressing

## 2019-10-22 NOTE — Consult Note (Signed)
   [facility] CM Inpatient Consult   10/22/2019  [name] 01-22-1951 [id number]   Patient is currently active with [facility] for chronic disease management services.  Patient has been engaged by a [employer] fundraiser.    Will continue to follow for patient progression and disposition and update community RN.  Of note, [facility] Care Management services does not replace or interfere with any services that are arranged by inpatient case management or social work.  Netta Cedars, MSN, [facility] Liaison Nurse Mobile Phone [phone]  Toll free office [phone]

## 2019-10-22 NOTE — Progress Notes (Signed)
   10/22/19 1445  Assess: MEWS Score  BP (!) 137/95  Pulse Rate (!) 113  Resp (!) 25  Level of Consciousness Alert  SpO2 98 %  O2 Device Nasal Cannula  Assess: MEWS Score  MEWS Temp 0  MEWS Systolic 0  MEWS Pulse 2  MEWS RR 1  MEWS LOC 0  MEWS Score 3  MEWS Score Color Yellow  Assess: if the MEWS score is Yellow or Red  Were vital signs taken at a resting state? Yes  Focused Assessment Documented focused assessment  Early Detection of Sepsis Score *See Row Information* Low  MEWS guidelines implemented *See Row Information* Yes  Treat  MEWS Interventions Consulted Respiratory Therapy;Escalated (See documentation below);Administered scheduled meds/treatments  Take Vital Signs  Increase Vital Sign Frequency  Yellow: Q 2hr X 2 then Q 4hr X 2, if remains yellow, continue Q 4hrs  Escalate  MEWS: Escalate Yellow: discuss with charge nurse/RN and consider discussing with provider and RRT  Notify: Charge Nurse/RN  Name of Charge Nurse/RN Notified Lanisha RN  Date Charge Nurse/RN Notified 10/22/19  Time Charge Nurse/RN Notified 1633  Notify: Provider  Provider Name/Title Dr. Rosendo Gros  Date Provider Notified 10/22/19  Time Provider Notified 1500  Notification Type Call  Notification Reason Change in status;Other (Comment) (RN concerned )  Response Other (Comment) (PRNs given )  Date of Provider Response 10/22/19  Time of Provider Response 1500  Document  Progress note created (see row info) Yes

## 2019-10-23 LAB — RENAL FUNCTION PANEL
Albumin: 2.6 g/dL — ABNORMAL LOW (ref 3.5–5.0)
Anion gap: 9 (ref 5–15)
BUN: 48 mg/dL — ABNORMAL HIGH (ref 8–23)
CO2: 28 mmol/L (ref 22–32)
Calcium: 8.5 mg/dL — ABNORMAL LOW (ref 8.9–10.3)
Chloride: 102 mmol/L (ref 98–111)
Creatinine, Ser: 2.7 mg/dL — ABNORMAL HIGH (ref 0.61–1.24)
GFR calc Af Amer: 27 mL/min — ABNORMAL LOW (ref >60)
GFR calc non Af Amer: 23 mL/min — ABNORMAL LOW (ref >60)
Glucose, Bld: 137 mg/dL — ABNORMAL HIGH (ref 70–99)
Phosphorus: 2.2 mg/dL — ABNORMAL LOW (ref 2.5–4.6)
Potassium: 3.4 mmol/L — ABNORMAL LOW (ref 3.5–5.1)
Sodium: 139 mmol/L (ref 135–145)

## 2019-10-23 MED ORDER — POTASSIUM CHLORIDE 20 MEQ PO PACK
40.0000 meq | PACK | Freq: Two times a day (BID) | ORAL | Status: DC
  Administered 2019-10-23 – 2019-10-25 (×5): 40 meq via ORAL
  Filled 2019-10-23 (×5): qty 2

## 2019-10-23 NOTE — Progress Notes (Signed)
[facility] Progress Note   69 year old with chronic kidney disease, aortic regurgitation, anemia, BPH, hyperlipidemia, hypertension who presented for surgical hernia repair on 5/12 req a liter  of lactated Ringer's as well as  phenylephrine.  Patient has had progressive distention of his abdomen as well as subsequent pain.   Labs are checked outpatient on 5/7 with creatinine being 2 at that time. In general his creatinine has been 2.3-2.8 with fairly widefluctuations. Creatinine was not checked until 5/14 and was found to be 3.8 but improving trend.  Assessment/ Plan:   1. AKI on CKD: Baseline creatinine 2.3-2.8. Now with creatinine of 4. No labs on day of surgery or day after. Urine output seems to be reassuring with many undocumented urine outputs. Most likely ATN secondary to hypotension during surgery. Obstruction secondary to BPH is also possible. Lastly abdominal compartment syndrome possible but unlikely. Will perform further work-up 1. Follow-up CT abdomen pelvis looking for hydronephrosis -> cyst vs dilated calyces. But no obvious hydronephrosis or dilated ureters. He has a foley in place. 2. If renal function worsens then consider obtaining bladder pressure 3. Strict  ins and outs 4. Good UOP but he appears dyspneic and has edema -> tolerated  a single dose of Lasix on 10/23/2019. 5. Would stop the LR. 6. Signing off at this time; please reconsult as needed.  2. Gout: hold colchicine given decreased GFR.  3. HTN: borderline low blood pressures. Can continueamlodipine 5 mg daily for now. Stop metoprolol. 4. Abdominal distention concerning for ileus: CT abdomen pelvis today. Continue IV hydration while n.p.o. Defer management to surgical team. 5. Hypokalemia: Mild decreased to 3.2. Repletion per primary team. 6. History of anemia: Globin normal at this time. Can hold home iron 7. Hyperglycemia: Consider sliding scale insulin. Hemoglobin A1c added  on.Previously normal. 8. Hyperlipidemia: Holding home aspirin and statin given difficulty with p.o. can restart when able 9. BPH: CT abdomen pelvis assessing for hydronephrosis 5/16 (?cyst vs dilated calyces). Would restart home Proscar when able.  Subjective:   Shortness of breath improved today, tolerating PO. Denies cp/ n/v; no events overnight   Objective:   BP (!) 137/97 (BP Location: Right Arm)   Pulse (!) 102   Temp 98.8 F (37.1 C) (Oral)   Resp 17   Ht 6\' 1"  (1.854 m)   Wt 122.5 kg   SpO2 93%   BMI 35.62 kg/m   Intake/Output Summary (Last 24 hours) at 10/23/2019 1055 Last data filed at 10/23/2019 [id number] Gross per 24 hour  Intake 185 ml  Output 2750 ml  Net -2565 ml   Weight change:   Physical Exam: [id number] in bed, tired, no distress ENT: no nasal discharge, mmm EYES: no scleral icterus, eomi CV: normal rate, noobvious murmurs PULM: no iwob, bilateral chest rise [id number] bowel sounds, marked distention, pain with palpation SKIN: no rashes or jaundice EXT: tr - 1+ edema, warm and well perfused  Imaging: CT ABDOMEN PELVIS WO CONTRAST  Result Date: 10/21/2019 CLINICAL DATA:  Postop Day #4, robotic TAR hernia repair EXAM: CT ABDOMEN AND PELVIS WITHOUT CONTRAST TECHNIQUE: Multidetector CT imaging of the abdomen and pelvis was performed following the standard protocol without IV contrast. COMPARISON:  CT 07/13/2019 FINDINGS: Lower chest: Dependent areas of atelectasis more bandlike opacities likely reflecting further subsegmental atelectasis in the postoperative state. Cardiac size at the upper limits normal. No pericardial effusion. Hepatobiliary: Previously seen hypoattenuating lesions towards the liver dome appear quite subtle on today's examination, but are grossly unchanged in  size again measuring 2.2 cm anteriorly and 2 cm just posterolateral (3/14). No new visible liver lesions. Normal gallbladder. No biliary dilatation. Pancreas: Unremarkable. No  pancreatic ductal dilatation or surrounding inflammatory changes. Spleen: Prior splenectomy Adrenals/Urinary Tract: No concerning adrenal nodules. The right kidney appears markedly atrophic with some small subcentimeter hypoattenuating foci possibly cysts. Areas of cortical scarring are present in the left kidney as well with few hypoattenuating regions possibly cyst versus calyceal diverticuli largest in the lower pole measuring 2.2 cm, not significantly changed from most recent comparison. No urolithiasis or hydronephrosis. Minimal bladder thickening. Stomach/Bowel: Fairly pronounced dilatation of the stomach with a mixture of ingested material and contrast media. Contrast traverses part way through the small bowel without evidence of high-grade small bowel obstruction. No small bowel thickening or dilatation. Proximal colon displaced some formed stool while much of the distal colon is air distended but without significant wall thickening or pericolonic inflammation. Scattered colonic diverticula without focal pericolonic inflammation to suggest diverticulitis. Vascular/Lymphatic: Atherosclerotic calcifications throughout the abdominal aorta and branch vessels. No aortic aneurysm or ectasia. 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 No enlarged abdominopelvic lymph nodes. Reproductive: EMR indicates a history of prior prostatectomy. Seminal vesicles appear retained and are unremarkable. Other: There are postsurgical changes along the anterior abdominal wall corresponding to the previously seen ventral hernia. There is marked low attenuation thickening along the anterior fascia which could reflect some postoperative seroma. A small bilobed outpouching is seen along the inferior portion of this surgical site measuring up to 4.4 x 9.4 x 8.5 cm in size containing a mixture of this low-attenuation fluid and slightly more high attenuation material which could reflect trace  hematoma versus surgical material (3/64). Just superiorly is a second area potential fascial defect containing a small amount of fluid and gas (3/48) measures 3.5 x 3.8 x 2.8 cm. Likely a fat containing hernia with some distributed fluid from the collection detailed above. Additional mild edema noted in the hips. Few punctate foci of intraperitoneal air as well as gas tracking along the fascial planes to the tunica vaginalis and into the inguinal canals, likely related to surgery. Small focus of lobular stranding anterior to the colon (3/55) likely omental fat infarct. Musculoskeletal: Multilevel degenerative changes are present in the imaged portions of the spine. Sclerotic changes across the L4-5 disc space are similar to prior. No new suspicious lytic or blastic lesions. IMPRESSION: 1. Extensive postsurgical changes along the anterior abdominal wall which likely correspond with the site of abdominal hernia repair. There is diffuse fluid thickening along the anterior fascia which may reflect some seroma or postoperative fluid. Fairly large bilobed collection is seen anteriorly along the the inferior extent fat and mixed attenuation fluid collection possibly reflecting a seroma and/or hematoma versus surgical material within the collection. A second small anterior outpouching is seen along the superior margin containing fat, fluid and a small amount of gas. No bowel containing hernia. 2. Small amount of free intraperitoneal air as well as gas tracking along the tunica vaginalis is likely postoperative in nature. 3. Lobular region of focal fat stranding anterior to the colon near the operative site may reflect focal fat necrosis of the omentum. 4. No bowel containing hernia or evidence of small-bowel obstruction. Minimal air distention of the colon is nonspecific. No evidence of mechanical colonic obstruction. 5. Colonic diverticulosis without evidence of diverticulitis. 6. Previously seen hypoattenuating lesions  towards the liver dome appear quite subtle on today's  examination, but are grossly unchanged in size again measuring 2.2 cm anteriorly and 2 cm just posterolateral. 7. Stable renal cysts versus dilated calices. 8. Prior splenectomy. 9. 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 10.  Aortic Atherosclerosis (ICD10-I70.0). Electronically Signed   By: Lovena Le M.D.   On: 10/21/2019 15:31    Labs: BMET Recent Labs  Lab 10/20/19 1139 10/21/19 0956 10/22/19 0551 10/23/19 0212  NA 138 138 137 139  K 3.2* 3.6 3.3* 3.4*  CL 100 97* 99 102  CO2 28 31 28 28   GLUCOSE 138* 140* 116* 137*  BUN 44* 54* 54* 48*  CREATININE 3.83* 4.00* 3.31* 2.70*  CALCIUM 8.6* 8.6* 8.6* 8.5*  PHOS  --   --  3.4 2.2*   CBC Recent Labs  Lab 10/18/19 0159 10/20/19 1139  WBC 6.5 10.4  HGB 12.9* 13.3  HCT 40.5 42.7  MCV 89.0 90.1  PLT 170 148*    Medications:    . acetaminophen  1,000 mg Oral Q8H  . allopurinol  200 mg Oral Daily  . amLODipine  5 mg Oral Daily  . Chlorhexidine Gluconate Cloth  6 each Topical Daily  . enoxaparin (LOVENOX) injection  30 mg Subcutaneous Q24H  . gabapentin  300 mg Oral BID  . metoCLOPramide (REGLAN) injection  10 mg Intravenous Q6H  . potassium chloride  40 mEq Oral BID  . tamsulosin  0.4 mg Oral QPC breakfast      Otelia Santee, MD 10/23/2019, 10:55 AM

## 2019-10-23 NOTE — Progress Notes (Signed)
6 Days Post-Op   Subjective/Chief Complaint: Pt appears more comfortable today +BMs and flatus Mobilizing Tol some PO   Objective: Vital signs in last 24 hours: Temp:  [97.6 F (36.4 C)-99 F (37.2 C)] 98.8 F (37.1 C) (05/18 0626) Pulse Rate:  [102-115] 102 (05/18 0626) Resp:  [16-27] 17 (05/18 0626) BP: (116-143)/(76-106) 137/97 (05/18 0626) SpO2:  [93 %-98 %] 93 % (05/18 0626) Last BM Date: 10/22/19  Intake/Output from previous day: 05/17 0701 - 05/18 0700 In: 945.3 [P.O.:465; I.V.:480.3] Out: 2750 [Urine:2750] Intake/Output this shift: No intake/output data recorded.  General appearance: alert and cooperative GI: soft, non-tender; bowel sounds normal; no masses,  no organomegaly and less distended today  Lab Results:  Recent Labs    10/20/19 1139  WBC 10.4  HGB 13.3  HCT 42.7  PLT 148*   BMET Recent Labs    10/22/19 0551 10/23/19 0212  NA 137 139  K 3.3* 3.4*  CL 99 102  CO2 28 28  GLUCOSE 116* 137*  BUN 54* 48*  CREATININE 3.31* 2.70*  CALCIUM 8.6* 8.5*   PT/INR No results for input(s): LABPROT, INR in the last 72 hours. ABG No results for input(s): PHART, HCO3 in the last 72 hours.  Invalid input(s): PCO2, PO2  Studies/Results: CT ABDOMEN PELVIS WO CONTRAST  Result Date: 10/21/2019 CLINICAL DATA:  Postop Day #4, robotic TAR hernia repair EXAM: CT ABDOMEN AND PELVIS WITHOUT CONTRAST TECHNIQUE: Multidetector CT imaging of the abdomen and pelvis was performed following the standard protocol without IV contrast. COMPARISON:  CT 07/13/2019 FINDINGS: Lower chest: Dependent areas of atelectasis more bandlike opacities likely reflecting further subsegmental atelectasis in the postoperative state. Cardiac size at the upper limits normal. No pericardial effusion. Hepatobiliary: Previously seen hypoattenuating lesions towards the liver dome appear quite subtle on today's examination, but are grossly unchanged in size again measuring 2.2 cm anteriorly and 2  cm just posterolateral (3/14). No new visible liver lesions. Normal gallbladder. No biliary dilatation. Pancreas: Unremarkable. No pancreatic ductal dilatation or surrounding inflammatory changes. Spleen: Prior splenectomy Adrenals/Urinary Tract: No concerning adrenal nodules. The right kidney appears markedly atrophic with some small subcentimeter hypoattenuating foci possibly cysts. Areas of cortical scarring are present in the left kidney as well with few hypoattenuating regions possibly cyst versus calyceal diverticuli largest in the lower pole measuring 2.2 cm, not significantly changed from most recent comparison. No urolithiasis or hydronephrosis. Minimal bladder thickening. Stomach/Bowel: Fairly pronounced dilatation of the stomach with a mixture of ingested material and contrast media. Contrast traverses part way through the small bowel without evidence of high-grade small bowel obstruction. No small bowel thickening or dilatation. Proximal colon displaced some formed stool while much of the distal colon is air distended but without significant wall thickening or pericolonic inflammation. Scattered colonic diverticula without focal pericolonic inflammation to suggest diverticulitis. Vascular/Lymphatic: Atherosclerotic calcifications throughout the abdominal aorta and branch vessels. No aortic aneurysm or ectasia. 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 No enlarged abdominopelvic lymph nodes. Reproductive: EMR indicates a history of prior prostatectomy. Seminal vesicles appear retained and are unremarkable. Other: There are postsurgical changes along the anterior abdominal wall corresponding to the previously seen ventral hernia. There is marked low attenuation thickening along the anterior fascia which could reflect some postoperative seroma. A small bilobed outpouching is seen along the inferior portion of this surgical site measuring up to 4.4 x 9.4 x 8.5 cm  in size containing a mixture of this low-attenuation  fluid and slightly more high attenuation material which could reflect trace hematoma versus surgical material (3/64). Just superiorly is a second area potential fascial defect containing a small amount of fluid and gas (3/48) measures 3.5 x 3.8 x 2.8 cm. Likely a fat containing hernia with some distributed fluid from the collection detailed above. Additional mild edema noted in the hips. Few punctate foci of intraperitoneal air as well as gas tracking along the fascial planes to the tunica vaginalis and into the inguinal canals, likely related to surgery. Small focus of lobular stranding anterior to the colon (3/55) likely omental fat infarct. Musculoskeletal: Multilevel degenerative changes are present in the imaged portions of the spine. Sclerotic changes across the L4-5 disc space are similar to prior. No new suspicious lytic or blastic lesions. IMPRESSION: 1. Extensive postsurgical changes along the anterior abdominal wall which likely correspond with the site of abdominal hernia repair. There is diffuse fluid thickening along the anterior fascia which may reflect some seroma or postoperative fluid. Fairly large bilobed collection is seen anteriorly along the the inferior extent fat and mixed attenuation fluid collection possibly reflecting a seroma and/or hematoma versus surgical material within the collection. A second small anterior outpouching is seen along the superior margin containing fat, fluid and a small amount of gas. No bowel containing hernia. 2. Small amount of free intraperitoneal air as well as gas tracking along the tunica vaginalis is likely postoperative in nature. 3. Lobular region of focal fat stranding anterior to the colon near the operative site may reflect focal fat necrosis of the omentum. 4. No bowel containing hernia or evidence of small-bowel obstruction. Minimal air distention of the colon is nonspecific. No evidence of mechanical  colonic obstruction. 5. Colonic diverticulosis without evidence of diverticulitis. 6. Previously seen hypoattenuating lesions towards the liver dome appear quite subtle on today's examination, but are grossly unchanged in size again measuring 2.2 cm anteriorly and 2 cm just posterolateral. 7. Stable renal cysts versus dilated calices. 8. Prior splenectomy. 9. 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 10.  Aortic Atherosclerosis (ICD10-I70.0). Electronically Signed   By: Lovena Le M.D.   On: 10/21/2019 15:31    Anti-infectives: Anti-infectives (From admission, onward)   Start     Dose/Rate Route Frequency Ordered Stop   10/17/19 1115  ceFAZolin (ANCEF) 3 g in dextrose 5 % 50 mL IVPB     3 g 100 mL/hr over 30 Minutes Intravenous On call to O.R. 10/17/19 1110 10/17/19 1329      Assessment/Plan: s/p Procedure(s): XI ROBOTIC ASSISTED INCISIONAL HERNIA REPAIR WITH MESH (N/A) Insertion Of Mesh (N/A) Continue foley due to urinary output monitoring  -Cr con't to improve appreciate Nephro assistance -replete K+ -mobilize -con't reglan -Pulm toilet  LOS: 3 days    Ralene Ok 10/23/2019

## 2019-10-23 NOTE — Plan of Care (Signed)
  Problem: Education: Goal: Knowledge of General Education information will improve Description: Including pain rating scale, medication(s)/side effects and non-pharmacologic comfort measures Outcome: Progressing   Problem: Health Behavior/Discharge Planning: Goal: Ability to manage health-related needs will improve Outcome: Progressing   Problem: Clinical Measurements: Goal: Ability to maintain clinical measurements within normal limits will improve Outcome: Progressing Goal: Will remain free from infection Outcome: Progressing Goal: Diagnostic test results will improve Outcome: Progressing Goal: Respiratory complications will improve Outcome: Progressing Goal: Cardiovascular complication will be avoided Outcome: Progressing   Problem: Activity: Goal: Risk for activity intolerance will decrease Outcome: Progressing   Problem: Nutrition: Goal: Adequate nutrition will be maintained Outcome: Progressing   Problem: Coping: Goal: Level of anxiety will decrease Outcome: Progressing   Problem: Pain Managment: Goal: General experience of comfort will improve Outcome: Progressing   Problem: Safety: Goal: Ability to remain free from injury will improve Outcome: Progressing   Problem: Skin Integrity: Goal: Risk for impaired skin integrity will decrease Outcome: Progressing   Problem: Education: Goal: Required Educational Video(s) Outcome: Progressing   Problem: Clinical Measurements: Goal: Ability to maintain clinical measurements within normal limits will improve Outcome: Progressing Goal: Postoperative complications will be avoided or minimized Outcome: Progressing   Problem: Skin Integrity: Goal: Demonstration of wound healing without infection will improve Outcome: Progressing

## 2019-10-24 ENCOUNTER — Ambulatory Visit: Payer: Medicare Other | Admitting: Family Medicine

## 2019-10-24 DIAGNOSIS — Z0289 Encounter for other administrative examinations: Secondary | ICD-10-CM

## 2019-10-24 LAB — RENAL FUNCTION PANEL
Albumin: 2.5 g/dL — ABNORMAL LOW (ref 3.5–5.0)
Anion gap: 8 (ref 5–15)
BUN: 31 mg/dL — ABNORMAL HIGH (ref 8–23)
CO2: 30 mmol/L (ref 22–32)
Calcium: 8.6 mg/dL — ABNORMAL LOW (ref 8.9–10.3)
Chloride: 105 mmol/L (ref 98–111)
Creatinine, Ser: 1.95 mg/dL — ABNORMAL HIGH (ref 0.61–1.24)
GFR calc Af Amer: 40 mL/min — ABNORMAL LOW (ref >60)
GFR calc non Af Amer: 34 mL/min — ABNORMAL LOW (ref >60)
Glucose, Bld: 93 mg/dL (ref 70–99)
Phosphorus: 2 mg/dL — ABNORMAL LOW (ref 2.5–4.6)
Potassium: 3.6 mmol/L (ref 3.5–5.1)
Sodium: 143 mmol/L (ref 135–145)

## 2019-10-24 MED ORDER — BISACODYL 10 MG RE SUPP: 10.0000 mg | Freq: Every day | RECTAL | Status: DC | PRN

## 2019-10-24 MED ORDER — ENOXAPARIN SODIUM 40 MG/0.4ML ~~LOC~~ SOLN
40.0000 mg | SUBCUTANEOUS | Status: DC
  Administered 2019-10-25: 40 mg via SUBCUTANEOUS
  Filled 2019-10-24: qty 0.4

## 2019-10-24 MED ORDER — HYDRALAZINE HCL 20 MG/ML IJ SOLN
5.0000 mg | Freq: Four times a day (QID) | INTRAMUSCULAR | Status: DC | PRN
  Administered 2019-10-24: 10 mg via INTRAVENOUS
  Filled 2019-10-24: qty 1

## 2019-10-24 MED ORDER — POLYETHYLENE GLYCOL 3350 17 G PO PACK
17.0000 g | PACK | Freq: Every day | ORAL | Status: DC
  Administered 2019-10-24 – 2019-10-25 (×2): 17 g via ORAL
  Filled 2019-10-24 (×2): qty 1

## 2019-10-24 MED ORDER — HYDROCERIN EX CREA
TOPICAL_CREAM | Freq: Two times a day (BID) | CUTANEOUS | Status: DC
  Filled 2019-10-24: qty 113

## 2019-10-24 NOTE — Progress Notes (Signed)
Notified PA Kathlee Nations of higher BP but pt is Dr. Rosendo Gros private pt, will try and notify him.

## 2019-10-24 NOTE — Progress Notes (Signed)
7 Days Post-Op   Subjective/Chief Complaint: BMs and flatus Mobilizing    Objective: Vital signs in last 24 hours: Temp:  [98.4 F (36.9 C)-98.5 F (36.9 C)] 98.4 F (36.9 C) (05/19 0625) Pulse Rate:  [99-107] 107 (05/19 0625) Resp:  [17-20] 17 (05/19 0625) BP: (139-150)/(95-107) 150/107 (05/19 0625) SpO2:  [95 %-99 %] 98 % (05/19 0625) Last BM Date: 10/23/19  Intake/Output from previous day: 05/18 0701 - 05/19 0700 In: 660 [P.O.:660] Out: 3050 [Urine:3050] Intake/Output this shift: Total I/O In: 60 [P.O.:60] Out: 1450 [Urine:1450]  General appearance: alert and cooperative GI: soft, non-tender; bowel sounds normal; no masses,  no organomegaly and less distended, active BS  Lab Results:  No results for input(s): WBC, HGB, HCT, PLT in the last 72 hours. BMET Recent Labs    10/23/19 0212 10/24/19 0253  NA 139 143  K 3.4* 3.6  CL 102 105  CO2 28 30  GLUCOSE 137* 93  BUN 48* 31*  CREATININE 2.70* 1.95*  CALCIUM 8.5* 8.6*   PT/INR No results for input(s): LABPROT, INR in the last 72 hours. ABG No results for input(s): PHART, HCO3 in the last 72 hours.  Invalid input(s): PCO2, PO2  Studies/Results: No results found.  Anti-infectives: Anti-infectives (From admission, onward)   Start     Dose/Rate Route Frequency Ordered Stop   10/17/19 1115  ceFAZolin (ANCEF) 3 g in dextrose 5 % 50 mL IVPB     3 g 100 mL/hr over 30 Minutes Intravenous On call to O.R. 10/17/19 1110 10/17/19 1329      Assessment/Plan: s/p Procedure(s): XI ROBOTIC ASSISTED INCISIONAL HERNIA REPAIR WITH MESH (N/A) Insertion Of Mesh (N/A) Advance diet  DC foley Plan for DC tomorrow if con't to do well   LOS: 4 days    Ralene Ok 10/24/2019

## 2019-10-24 NOTE — Progress Notes (Deleted)
[place] Phone: [phone] Subjective:    I'm seeing this patient by the request  of:  Biagio Borg, MD  CC:   [id number]   09/19/2019 Right knee injection given today.  Tolerated the procedure well, discussed which activities to do which wants to avoid.  Increase activity slowly.  Follow-up again in 8 weeks.   It does appear that patient swelling is likely secondary to more of a gouty flare.  Do not know if this is secondary to the vaccine versus just a coincidence.  I do not see any signs of true infectious etiology noted today but I encourage patient to continue the Keflex.  If any worsening symptoms I would expand to doxycycline for better MRSA coverage but I am hoping that this is not necessary.  Seems to be responding well to the prednisone and can.  Continue.  Avoid oral anti-inflammatory secondary to the chronic kidney injury that he has had previously.  Also has had significant infections of the kidneys previously.  Laboratory work-up otherwise was fairly unremarkable from the emergency department.  Follow-up with me again in 2 to 4 weeks  Update 10/24/2019 [name] is a 69 y.o. male coming in with complaint of right arm pain and right knee pain. Patient states      Past Medical History:  Diagnosis Date  . Anemia    normal Fe, nl B12, nl retic, nl EPO July '13  . Aortic regurgitation    Moderate AI 04/17/19 echo  . Blood transfusion without reported diagnosis   . BPH (benign prostatic hyperplasia)   . Chronic back pain   . Chronic kidney disease    CKD III, obstructive nephropathy  . Diverticulosis   . Dysuria   . Elevated PSA, greater than or equal to 20 ng/ml June '13   PSA 107  . Hemorrhoids, internal, with bleeding, prolapse 09/19/2014  . Hyperlipidemia 05/29/2014  . Hypertension   . Obstructive uropathy 11/27/2015  . Pre-diabetes    per patient "reduced sugar intake"  . Renal  insufficiency   . Tuberculosis    h/o PPD +  . UTI (urinary tract infection)   . Vitamin D deficiency 09/13/2017   Past Surgical History:  Procedure Laterality Date  . CARPAL TUNNEL RELEASE Right   . COLONOSCOPY    . HEMORRHOID BANDING    . INSERTION OF MESH N/A 10/17/2019   Procedure: Insertion Of Mesh;  Surgeon: Ralene Ok, MD;  Location: [facility];  Service: General;  Laterality: N/A;  . SPLENECTOMY    . XI ROBOTIC ASSISTED SIMPLE PROSTATECTOMY N/A 03/09/2019   Procedure: XI ROBOTIC ASSISTED SIMPLE PROSTATECTOMY;  Surgeon: Cleon Gustin, MD;  Location: [facility];  Service: Urology;  Laterality: N/A;  . XI ROBOTIC ASSISTED VENTRAL HERNIA N/A 10/17/2019   Procedure: XI ROBOTIC ASSISTED INCISIONAL HERNIA REPAIR WITH MESH;  Surgeon: Ralene Ok, MD;  Location: [facility];  Service: General;  Laterality: N/A;   Social History   Socioeconomic History  . Marital status: Single    Spouse name: Not on file  . Number of children: 2  . Years of education: 21  . Highest education level: Some college, no degree  Occupational History  . Occupation: Lobbyist: [employer]    Comment: unemployed  Tobacco Use  . Smoking status: Former Smoker    Types: Cigarettes    Quit date: 02/26/1980    Years since quitting:  39.6  . Smokeless tobacco: Never Used  . Tobacco comment: Quit 1981  Substance and Sexual Activity  . Alcohol use: No    Alcohol/week: 0.0 standard drinks    Comment: Quit 2004  . Drug use: No  . Sexual activity: Not Currently  Other Topics Concern  . Not on file  Social History Narrative   Native of [place], raised poor farming community. . To [place] '78 - finished College, all but Arts administrator. Married - wife in [place], blocked from [place] to [place]. 1 son '90; 1 dtr '93. 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 Resources depleted.   Right handed.   Caffeine Hot daily one daily.   Social Determinants of Health    Financial Resource Strain: Medium Risk  . Difficulty of Paying Living Expenses: Somewhat hard  Food Insecurity: No Food Insecurity  . Worried About Charity fundraiser in the Last Year: Never true  . Ran Out of Food in the Last Year: Never true  Transportation Needs: Unmet Transportation Needs  . Lack of Transportation (Medical): Yes  . Lack of Transportation (Non-Medical): Yes  Physical Activity: Insufficiently Active  . Days of Exercise per Week: 3 days  . Minutes of Exercise per Session: 10 min  Stress: No Stress Concern Present  . Feeling of Stress : Only a little  Social Connections: Not Isolated  . Frequency of Communication with Friends and Family: Three times a week  . Frequency of Social Gatherings with Friends and Family: Once a week  . Attends Religious Services: 1 to 4 times per year  . Active Member of Clubs or Organizations: Yes  . Attends Archivist Meetings: 1 to 4 times per year  . Marital Status: Married   No Known Allergies Family History  Problem Relation Age of Onset  . Diabetes Mother   . Stroke Brother   . Hearing loss Brother   . Colon cancer Neg Hx   . Rectal cancer Neg Hx   . Stomach cancer Neg Hx   . Esophageal cancer Neg Hx         Facility-Administered Medications Ordered in Other Visits (Cardiovascular):  .  amLODipine (NORVASC) tablet 5 mg    Facility-Administered Medications Ordered in Other Visits (Respiratory):  .  ipratropium-albuterol (DUONEB) 0.5-2.5 (3) MG/3ML nebulizer solution 3 mL    Facility-Administered Medications Ordered in Other Visits (Analgesics):  .  acetaminophen (TYLENOL) tablet 1,000 mg .  allopurinol (ZYLOPRIM) tablet 200 mg .  HYDROmorphone (DILAUDID) injection 1-2 mg .  oxyCODONE (Oxy IR/ROXICODONE) immediate release tablet 5-10 mg .  traMADol (ULTRAM) tablet 50 mg    Facility-Administered Medications Ordered in Other Visits (Hematological):  .  enoxaparin (LOVENOX) injection 30  mg    Facility-Administered Medications Ordered in Other Visits (Other):  .  bisacodyl (DULCOLAX) EC tablet 5 mg .  bisacodyl (DULCOLAX) suppository 10 mg .  Chlorhexidine Gluconate Cloth 2 % PADS 6 each .  dextrose 5 %-0.9 % sodium chloride infusion .  diazepam (VALIUM) tablet 5 mg .  gabapentin (NEURONTIN) capsule 300 mg .  hydroxypropyl methylcellulose / hypromellose (ISOPTO TEARS / GONIOVISC) 2.5 % ophthalmic solution 1-2 drop .  lactated ringers infusion .  polyethylene glycol (MIRALAX / GLYCOLAX) packet 17 g .  potassium chloride (KLOR-CON) packet 40 mEq .  tamsulosin (FLOMAX) capsule 0.4 mg No current facility-administered medications for this visit. No current outpatient medications on file.   Reviewed prior external information including notes and imaging from  primary care provider As well as notes that were available from care everywhere and other healthcare systems.  Past medical history, social, surgical and family history all reviewed in electronic medical record.  No pertanent information unless stated regarding to the chief complaint.   Review of Systems:  No headache, visual changes, nausea, vomiting, diarrhea, constipation, dizziness, abdominal pain, skin rash, fevers, chills, night sweats, weight loss, swollen lymph nodes, body aches, joint swelling, chest pain, shortness of breath, mood changes. POSITIVE muscle aches  Objective    There were no vitals taken for this visit.     General: No apparent distress alert and oriented x3 mood and affect normal, dressed appropriately.  HEENT: Pupils equal, extraocular movements intact  Respiratory: Patient's speak in full sentences and does not appear short of breath  Cardiovascular: No lower extremity edema, non tender, no erythema  Neuro: Cranial nerves II through XII are intact, neurovascularly intact in all extremities with 2+ DTRs and 2+ pulses.  Gait normal with good balance and coordination.  MSK:  Non tender  with full range of motion and good stability and symmetric strength and tone of shoulders, elbows, wrist, hip, knee and ankles bilaterally.     Impression and Recommendations:     This case required medical decision making of moderate complexity. The above documentation has been reviewed and is accurate and complete Jacqualin Combes       Note: This dictation was prepared with Dragon dictation along with smaller phrase technology. Any transcriptional errors that result from this process are unintentional.

## 2019-10-24 NOTE — Progress Notes (Signed)
Gave Hydralazine 10 mg IV for DBP 107, will recheck BP.

## 2019-10-25 LAB — RENAL FUNCTION PANEL
Albumin: 2.7 g/dL — ABNORMAL LOW (ref 3.5–5.0)
Anion gap: 11 (ref 5–15)
BUN: 20 mg/dL (ref 8–23)
CO2: 29 mmol/L (ref 22–32)
Calcium: 8.6 mg/dL — ABNORMAL LOW (ref 8.9–10.3)
Chloride: 102 mmol/L (ref 98–111)
Creatinine, Ser: 1.72 mg/dL — ABNORMAL HIGH (ref 0.61–1.24)
GFR calc Af Amer: 46 mL/min — ABNORMAL LOW (ref >60)
GFR calc non Af Amer: 40 mL/min — ABNORMAL LOW (ref >60)
Glucose, Bld: 90 mg/dL (ref 70–99)
Phosphorus: 2.3 mg/dL — ABNORMAL LOW (ref 2.5–4.6)
Potassium: 3.8 mmol/L (ref 3.5–5.1)
Sodium: 142 mmol/L (ref 135–145)

## 2019-10-25 MED ORDER — TRAMADOL HCL 50 MG PO TABS: 50.0000 mg | ORAL_TABLET | Freq: Four times a day (QID) | ORAL | 0 refills | Status: DC | PRN

## 2019-10-25 NOTE — Care Management Important Message (Signed)
Important Message  Patient Details  Name: [name] MRN: [id number] Date of Birth: [birth year]   Medicare Important Message Given:  Yes  Patient left prior to IM delivery.  Im mailed to the patients home.     Leshea Jaggers 10/25/2019, 3:26 PM

## 2019-10-25 NOTE — TOC Transition Note (Signed)
Transition of Care [facility]) - CM/SW Discharge Note   Patient Details  Name: Ernesto Carrel MRN: [id number] Date of Birth: [birth year]  [facility] CM/SW Contact:  Alexander Mt, LCSW Phone Number: 10/25/2019, 1:18 PM   Clinical Narrative:    Pt to d/c home today, per RN he has attempted multiple family/friends for assistance with getting home and has been unsuccessful. Pt provided with cab voucher, he is stable per MD for d/c.    Final next level of care: Home/Self Care Barriers to Discharge: Barriers Resolved   Patient Goals and CMS Choice Patient states their goals for this hospitalization and ongoing recovery are:: to go home CMS Medicare.gov Compare Post Acute Care list provided to:: Patient Choice offered to / list presented to : NA  Discharge Plan and Services   Discharge Planning Services: CM Consult Post Acute Care Choice: NA          DME Arranged: N/A DME Agency: NA HH Arranged: NA   Readmission Risk Interventions Readmission Risk Prevention Plan 10/22/2019 02/13/2019  Transportation Screening Complete Complete  PCP or Specialist Appt within 3-5 Days Not Complete Not Complete  Not Complete comments pending medical stability not ready for dc  [place] or [place] Complete Complete  Social Work Consult for [place] Planning/Counseling Complete Complete  Palliative Care Screening Not Applicable Not Applicable  Medication Review Press photographer) Referral to Pharmacy Complete  Some recent data might be hidden

## 2019-10-25 NOTE — Discharge Instructions (Signed)
[facility] _______[facility] ° °HERNIA REPAIR: POST OP INSTRUCTIONS ° °Always review your discharge instruction sheet given to you by the facility where your surgery was performed. °IF YOU HAVE DISABILITY OR FAMILY LEAVE FORMS, YOU MUST BRING THEM TO THE OFFICE FOR PROCESSING.   °DO NOT GIVE THEM TO YOUR DOCTOR. ° °1. A  prescription for pain medication may be given to you upon discharge.  Take your pain medication as prescribed, if needed.  If narcotic pain medicine is not needed, then you may take acetaminophen (Tylenol) or ibuprofen (Advil) as needed. °2. Take your usually prescribed medications unless otherwise directed. °If you need a refill on your pain medication, please contact your pharmacy.  They will contact our office to request authorization. Prescriptions will not be filled after 5 pm or on week-ends. °3. You should follow a light diet the first 24 hours after arrival home, such as soup and crackers, etc.  Be sure to include lots of fluids daily.  Resume your normal diet the day after surgery. °4.Most patients will experience some swelling and bruising around the umbilicus or in the groin and scrotum.  Ice packs and reclining will help.  Swelling and bruising can take several days to resolve.  °6. It is common to experience some constipation if taking pain medication after surgery.  Increasing fluid intake and taking a stool softener (such as Colace) will usually help or prevent this problem from occurring.  A mild laxative (Milk of Magnesia or Miralax) should be taken according to package directions if there are no bowel movements after 48 hours. °7. Unless discharge instructions indicate otherwise, you may remove your bandages 24-48 hours after surgery, and you may shower at that time.  You may have steri-strips (small skin tapes) in place directly over the incision.  These strips should be left on the skin for 7-10 days.  If your surgeon used skin glue on the incision, you may shower in  24 hours.  The glue will flake off over the next 2-3 weeks.  Any sutures or staples will be removed at the office during your follow-up visit. °8. ACTIVITIES:  You may resume regular (light) daily activities beginning the next day--such as daily self-care, walking, climbing stairs--gradually increasing activities as tolerated.  You may have sexual intercourse when it is comfortable.  Refrain from any heavy lifting or straining until approved by your doctor. ° °a.You may drive when you are no longer taking prescription pain medication, you can comfortably wear a seatbelt, and you can safely maneuver your car and apply brakes. °b.RETURN TO WORK:   °_____________________________________________ ° °9.You should see your doctor in the office for a follow-up appointment approximately 2-3 weeks after your surgery.  Make sure that you call for this appointment within a day or two after you arrive home to insure a convenient appointment time. °10.OTHER INSTRUCTIONS: _________________________ °   _____________________________________ ° °WHEN TO CALL YOUR DOCTOR: °1. Fever over 101.0 °2. Inability to urinate °3. Nausea and/or vomiting °4. Extreme swelling or bruising °5. Continued bleeding from incision. °6. Increased pain, redness, or drainage from the incision ° °The clinic staff is available to answer your questions during regular business hours.  Please don’t hesitate to call and ask to speak to one of the nurses for clinical concerns.  If you have a medical emergency, go to the nearest emergency room or call 911.  A surgeon from [facility] is always on call at the hospital ° ° °[address], [address], [place], [place]  [address] ? ° [address] [address], [place], [place]   [address] °[phone] ? [phone] ? FAX [phone] °Web site: [email or web address] ° °

## 2019-10-25 NOTE — Plan of Care (Signed)
Diagnostic test results will  improve  Add  Today at 1421 - Adequate for Discharge by Gwendolyn Fill, RN  Add  Respiratory complications will  improve  Add  Today at 1421 - Adequate for Discharge by Gwendolyn Fill, RN  Add  Cardiovascular complication will be  avoided  Add  Today at 1421 - Adequate for Discharge by Gwendolyn Fill, RN  Add  Activity:  Risk for activity intolerance will  decrease  Add  Today at 1421 - Adequate for Discharge by Gwendolyn Fill, RN  Add  Nutrition:  Adequate nutrition will be  maintained  Add  Today at 1421 - Adequate for Discharge by Gwendolyn Fill, RN  Add  Coping:  Level of anxiety will  decrease  Add  Today at 1421 - Adequate for Discharge by Gwendolyn Fill, RN  Add  Elimination:  Will not experience complications related to bowel  motility  Problem: Clinical Measurements: Goal: Diagnostic test results will improve Outcome: Adequate for Discharge Goal: Respiratory complications will improve Outcome: Adequate for Discharge Goal: Cardiovascular complication will be avoided Outcome: Adequate for Discharge   Problem: Activity: Goal: Risk for activity intolerance will decrease Outcome: Adequate for Discharge   Problem: Nutrition: Goal: Adequate nutrition will be maintained Outcome: Adequate for Discharge   Problem: Coping: Goal: Level of anxiety will decrease Outcome: Adequate for Discharge   Problem: Elimination: Goal: Will not experience complications related to bowel motility Outcome: Adequate for Discharge Goal: Will not experience complications related to urinary retention Outcome: Adequate for Discharge   Problem: Pain Managment: Goal: General experience of comfort will improve Outcome: Adequate for Discharge   Problem: Safety: Goal: Ability to remain free from injury will improve Outcome: Adequate for Discharge   Problem: Skin Integrity: Goal: Risk for impaired skin integrity will decrease Outcome:  Adequate for Discharge   Problem: Education: Goal: Required Educational Video(s) Outcome: Adequate for Discharge   Problem: Clinical Measurements: Goal: Ability to maintain clinical measurements within normal limits will improve Outcome: Adequate for Discharge Goal: Postoperative complications will be avoided or minimized Outcome: Adequate for Discharge   Problem: Skin Integrity: Goal: Demonstration of wound healing without infection will improve Outcome: Adequate for Discharge   Problem: Clinical Measurements: Goal: Diagnostic test results will improve Outcome: Adequate for Discharge Goal: Respiratory complications will improve Outcome: Adequate for Discharge Goal: Cardiovascular complication will be avoided Outcome: Adequate for Discharge   Problem: Activity: Goal: Risk for activity intolerance will decrease Outcome: Adequate for Discharge   Problem: Nutrition: Goal: Adequate nutrition will be maintained Outcome: Adequate for Discharge   Problem: Coping: Goal: Level of anxiety will decrease Outcome: Adequate for Discharge   Problem: Elimination: Goal: Will not experience complications related to bowel motility Outcome: Adequate for Discharge Goal: Will not experience complications related to urinary retention Outcome: Adequate for Discharge   Problem: Pain Managment: Goal: General experience of comfort will improve Outcome: Adequate for Discharge   Problem: Safety: Goal: Ability to remain free from injury will improve Outcome: Adequate for Discharge   Problem: Skin Integrity: Goal: Risk for impaired skin integrity will decrease Outcome: Adequate for Discharge   Problem: Education: Goal: Required Educational Video(s) Outcome: Adequate for Discharge   Problem: Clinical Measurements: Goal: Ability to maintain clinical measurements within normal limits will improve Outcome: Adequate for Discharge Goal: Postoperative complications will be avoided or  minimized Outcome: Adequate for Discharge   Problem: Skin Integrity: Goal: Demonstration of wound healing without infection will improve Outcome: Adequate for Discharge

## 2019-10-25 NOTE — Discharge Summary (Signed)
Physician Discharge Summary  Patient ID: [name] MRN: [id number] DOB/AGE: 09/14/1950 70 y.o.  Admit date: 10/17/2019 Discharge date: 10/25/2019  Admission Diagnoses: Incisional hernia  Discharge Diagnoses:  Active Problems:   S/P hernia repair   Discharged Condition: good  Hospital Course: Patient is a 69 year old male who underwent robotic incisional hernia repair with mesh.  Please see operative note for full details. Postoperative patient was sent to the floor.  Patient was started on eras protocol.  Patient had issues with pain as well as acute kidney injury.  Nephrology was consulted.  Patient was likely dehydrated. Renal function eventually turning towards baseline.  Patient had difficulty with ileus postoperatively.  This slowly resolved.  Patient was otherwise ambulating well on his own.  He was afebrile throughout his hospital stay.  He is deemed stable for discharge and discharged home.    Consults: nephrology  Significant Diagnostic Studies: none  Treatments: surgery: As above  Discharge Exam: Blood pressure (!) 156/107, pulse 97, temperature 98.4 F (36.9 C), temperature source Oral, resp. rate 15, height 6\' 1"  (1.854 m), weight 122.5 kg, SpO2 100 %. General appearance: alert and cooperative GI: soft, non-tender; bowel sounds normal; no masses,  no organomegaly and Incision clean dry and intact.  Disposition: Discharge disposition: 01-Home or Self Care       Discharge Instructions    Diet - low sodium heart healthy   Complete by: As directed    Increase activity slowly   Complete by: As directed      Allergies as of 10/25/2019   No Known Allergies     Medication List    TAKE these medications   acetaminophen 500 MG tablet Commonly known as: TYLENOL Take 1,000 mg by mouth every 6 (six) hours as needed for moderate pain.   allopurinol 100 MG tablet Commonly known as: ZYLOPRIM Take 2 tablets by mouth once daily   amLODipine 5 MG  tablet Commonly known as: NORVASC Take 1 tablet (5 mg total) by mouth daily.   aspirin EC 81 MG tablet Take 81 mg by mouth daily.   atorvastatin 40 MG tablet Commonly known as: LIPITOR TAKE 1 TABLET BY MOUTH ONCE DAILY **  KEEP  FOLLOW  UP  APPOINTMENT  IN  [facility]  FOR  FUTURE  REFILLS** What changed: See the new instructions.   cephALEXin 500 MG capsule Commonly known as: Keflex Take 1 capsule (500 mg total) by mouth 2 (two) times daily. 2 caps po bid x 7 days   cholecalciferol 25 MCG (1000 UNIT) tablet Commonly known as: VITAMIN D3 Take 1,000 Units by mouth daily.   colchicine 0.6 MG tablet Take 1 tablet by mouth once daily   ferrous sulfate 325 (65 FE) MG tablet Take 325 mg by mouth daily with breakfast.   finasteride 5 MG tablet Commonly known as: PROSCAR Take 1 tablet by mouth once daily   HYDROcodone-acetaminophen 5-325 MG tablet Commonly known as: NORCO/VICODIN Take 1 tablet by mouth every 6 (six) hours as needed for severe pain.   hydroxypropyl methylcellulose / hypromellose 2.5 % ophthalmic solution Commonly known as: ISOPTO TEARS / GONIOVISC Place 1-2 drops into both eyes 3 (three) times daily as needed (dry/irritated eyes.).   metoprolol tartrate 25 MG tablet Commonly known as: LOPRESSOR TAKE 1 TABLET BY MOUTH TWICE DAILY **KEEP  FOLLOW  UP  APPOINTMENT  IN  MARCH  FOR  FURTHER  REFILLS**   multivitamin with minerals Tabs tablet Take 1 tablet by mouth daily.   Potassium 99  MG Tabs Take 99 mg by mouth daily.   predniSONE 50 MG tablet Commonly known as: DELTASONE 1 tablet PO QD X4 days   traMADol 50 MG tablet Commonly known as: ULTRAM Take 1 tablet (50 mg total) by mouth every 6 (six) hours as needed for moderate pain.      Follow-up Information    Ralene Ok, MD. Schedule an appointment as soon as possible for a visit in 2 week(s).   Specialty: General Surgery Contact information: [address] [place]  [address] [phone]           Signed: Ralene Ok 10/25/2019, 7:30 AM

## 2019-10-26 ENCOUNTER — Telehealth: Payer: Self-pay | Admitting: *Deleted

## 2019-10-26 NOTE — Telephone Encounter (Signed)
Pt was on TCM report admitted 10/17/19 and underwent robotic incisional hernia repair with mesh. Patient had issues with pain as well as acute kidney injury. Nephrology was consulted.  Patient was likely dehydrated. Renal function eventually turning towards baseline. Patient had difficulty with ileus postoperatively. This slowly resolved,and D/C 10/25/19. Pt will f/u w/surgeon Ralene Ok, MD (General Surgery) in 2 weeks (11/08/2019).Marland KitchenJohny Chess

## 2019-10-29 ENCOUNTER — Other Ambulatory Visit: Payer: Self-pay | Admitting: *Deleted

## 2019-10-29 NOTE — Patient Outreach (Signed)
[facility]) Care Management  10/29/2019  [name] 16-Sep-1950 [id number]   PAC Outreach. Pt had planned hernia repair on 10/17/19. He had AKI and difficulty with pain mgmt.  Pt was in a restaurant and requested a call tomorrow.  [name] Myrtie Neither, MSN, [facility] Gerontological Nurse Practitioner [facility] Care Management [phone]

## 2019-10-30 ENCOUNTER — Other Ambulatory Visit: Payer: Self-pay | Admitting: *Deleted

## 2019-10-30 NOTE — Patient Outreach (Signed)
[facility]) Care Management  10/30/2019  Javonta Wigmore 03/02/1951 [id number]   Telephone assessment:  Post op hernia repair. Pt had  AKI and pain management issues. Upon discharge his kidney function had returned to baseline, although his creatinine and GFR are still elevated. He has previously been evaluated by Dr. Justin Mend and a consult with him would be prudent for update .  Has follow up post surgery on Thursday. Does not have his primary care appt to date. Pt states he will call tomorrow to schedule this.  He has all his medications. He admits to some abdominal pain at surgical site. He is taking Tramadol and APAP for pain relief with good results.  He is moving his bowels well.  His small incisions are well approximated and still have the steri strips in place. He denies any drainage. No fever.  He states he is drinking adequate fluids.  Mr. Fullen knows to call me if he has any issues. I will call him in one month for follow up.  NEW GOAL FOR THE NEXT 30 DAYS: PT TO ATTEND FOLLOW UP APPTS AS SCHEDULED OVER THE NEXT 30 DAYS.  [name] Myrtie Neither, MSN, [facility] Gerontological Nurse Practitioner [facility] Care Management [phone]

## 2019-10-31 NOTE — Addendum Note (Signed)
Addended by: Deloria Lair on: 10/31/2019 01:07 PM   Modules accepted: Orders

## 2019-11-03 ENCOUNTER — Other Ambulatory Visit: Payer: Self-pay | Admitting: *Deleted

## 2019-11-03 NOTE — Patient Outreach (Signed)
[facility]) Care Management  11/03/2019  [name] November 16, 1950 [id number]   CSW received referral on 10/31/2019 for meal assistance. CSW made contact with pt and identified pt's identity.  CSW introduced self, role and reason for call. Pt reports he was hospitalized and is now home recovering.  Pt reports he does not drive and post surgery is not able to easily take the bus or taxi to the grocery store (limited to 10lb weight lift ing restrictions).  CSW will make referral for meal delivery and update pt once accepted.  Pt reports he has been able to get his RX and groceries and no other needs identified at this time.   [name], MSW, [place] Worker  [place] [phone]

## 2019-11-08 ENCOUNTER — Other Ambulatory Visit: Payer: Self-pay | Admitting: *Deleted

## 2019-11-09 NOTE — Patient Outreach (Signed)
[facility]) Care Management  11/09/2019  [name] [birth year] [id number]  CSW received call back from pt reporting he has begun to receive the meal delivery. Pt is pleased and appreciative.  CSW reminded him the meals are for a short period and can be extended if needed. CSW also offered to place him on the wait list for meals on wheels and does not know if he will need the service on-going.  CSW will plan to touch base with pt again in the next 2 weeks to further assess. Pt denies any concerns or needs.    [name], MSW, [place] Worker  [name] [phone]

## 2019-11-12 ENCOUNTER — Other Ambulatory Visit: Payer: Self-pay

## 2019-11-12 ENCOUNTER — Ambulatory Visit (INDEPENDENT_AMBULATORY_CARE_PROVIDER_SITE_OTHER): Payer: Medicare Other | Admitting: Internal Medicine

## 2019-11-12 ENCOUNTER — Encounter: Payer: Self-pay | Admitting: Internal Medicine

## 2019-11-12 VITALS — BP 150/86 | HR 71 | Temp 98.5°F | Ht 73.0 in | Wt 268.0 lb

## 2019-11-12 DIAGNOSIS — M25561 Pain in right knee: Secondary | ICD-10-CM | POA: Diagnosis not present

## 2019-11-12 DIAGNOSIS — E785 Hyperlipidemia, unspecified: Secondary | ICD-10-CM

## 2019-11-12 DIAGNOSIS — H04123 Dry eye syndrome of bilateral lacrimal glands: Secondary | ICD-10-CM | POA: Diagnosis not present

## 2019-11-12 DIAGNOSIS — N189 Chronic kidney disease, unspecified: Secondary | ICD-10-CM | POA: Insufficient documentation

## 2019-11-12 DIAGNOSIS — N179 Acute kidney failure, unspecified: Secondary | ICD-10-CM

## 2019-11-12 DIAGNOSIS — R7309 Other abnormal glucose: Secondary | ICD-10-CM

## 2019-11-12 DIAGNOSIS — N183 Chronic kidney disease, stage 3 unspecified: Secondary | ICD-10-CM

## 2019-11-12 DIAGNOSIS — Z9889 Other specified postprocedural states: Secondary | ICD-10-CM

## 2019-11-12 DIAGNOSIS — Z8719 Personal history of other diseases of the digestive system: Secondary | ICD-10-CM

## 2019-11-12 DIAGNOSIS — M109 Gout, unspecified: Secondary | ICD-10-CM | POA: Diagnosis not present

## 2019-11-12 DIAGNOSIS — I10 Essential (primary) hypertension: Secondary | ICD-10-CM

## 2019-11-12 MED ORDER — HYPROMELLOSE (GONIOSCOPIC) 2.5 % OP SOLN: 1.0000 [drp] | Freq: Three times a day (TID) | OPHTHALMIC | 2 refills | Status: DC | PRN

## 2019-11-12 MED ORDER — PREDNISONE 10 MG PO TABS: ORAL_TABLET | ORAL | 0 refills | Status: DC

## 2019-11-12 NOTE — Assessment & Plan Note (Signed)
stable overall by history and exam, recent data reviewed with pt, and pt to continue medical treatment as before,  to f/u any worsening symptoms or concerns  

## 2019-11-12 NOTE — Progress Notes (Signed)
Subjective:    Patient ID: [name], male    DOB: [birth year], 69 y.o.   MRN: [id number]  HPI  Here to f/u recent hospn for symptomatic hernia s/p repair but complicated by AKI on CKD, mild hyperglycemia, post hospn acute onset right hand acute gouty arthritis 1st and 2nd mcps, all in the setting of chronic dry eyes, as well as ongoing mild worsening now mod to severe right lateral knee pain, swelling with worsening tendency to giveaways and several near falls.  Pt denies chest pain, increased sob or doe, wheezing, orthopnea, PND, increased LE swelling, palpitations, dizziness or syncope.  Pt denies new neurological symptoms such as new headache, or facial or extremity weakness or numbness   Pt denies polydipsia, polyuria   Pt denies fever, wt loss, night sweats, loss of appetite, or other constitutional symptoms Past Medical History:  Diagnosis Date  . Anemia    normal Fe, nl B12, nl retic, nl EPO July '13  . Aortic regurgitation    Moderate AI 04/17/19 echo  . Blood transfusion without reported diagnosis   . BPH (benign prostatic hyperplasia)   . Chronic back pain   . Chronic kidney disease    CKD III, obstructive nephropathy  . Diverticulosis   . Dysuria   . Elevated PSA, greater than or equal to 20 ng/ml June '13   PSA 107  . Hemorrhoids, internal, with bleeding, prolapse 09/19/2014  . Hyperlipidemia 05/29/2014  . Hypertension   . Obstructive uropathy 11/27/2015  . Pre-diabetes    per patient "reduced sugar intake"  . Renal insufficiency   . Tuberculosis    h/o PPD +  . UTI (urinary tract infection)   . Vitamin D deficiency 09/13/2017   Past Surgical History:  Procedure Laterality Date  . CARPAL TUNNEL RELEASE Right   . COLONOSCOPY    . HEMORRHOID BANDING    . INSERTION OF MESH N/A 10/17/2019   Procedure: Insertion Of Mesh;  Surgeon: Ralene Ok, MD;  Location: [place];  Service: General;  Laterality: N/A;  . SPLENECTOMY    . XI ROBOTIC ASSISTED SIMPLE PROSTATECTOMY N/A  03/09/2019   Procedure: XI ROBOTIC ASSISTED SIMPLE PROSTATECTOMY;  Surgeon: Cleon Gustin, MD;  Location: [facility];  Service: Urology;  Laterality: N/A;  . XI ROBOTIC ASSISTED VENTRAL HERNIA N/A 10/17/2019   Procedure: XI ROBOTIC ASSISTED INCISIONAL HERNIA REPAIR WITH MESH;  Surgeon: Ralene Ok, MD;  Location: [place];  Service: General;  Laterality: N/A;    reports that he quit smoking about 39 years ago. His smoking use included cigarettes. He has never used smokeless tobacco. He reports that he does not drink alcohol or use drugs. family history includes Diabetes in his mother; Hearing loss in his brother; Stroke in his brother. No Known Allergies Current Outpatient Medications on File Prior to Visit  Medication Sig Dispense Refill  . acetaminophen (TYLENOL) 500 MG tablet Take 1,000 mg by mouth every 6 (six) hours as needed for moderate pain.     Marland Kitchen allopurinol (ZYLOPRIM) 100 MG tablet Take 2 tablets by mouth once daily (Patient taking differently: Take 200 mg by mouth daily. ) 180 tablet 0  . amLODipine (NORVASC) 5 MG tablet Take 1 tablet (5 mg total) by mouth daily. 30 tablet 11  . aspirin EC 81 MG tablet Take 81 mg by mouth daily.    Marland Kitchen atorvastatin (LIPITOR) 40 MG tablet TAKE 1 TABLET BY MOUTH ONCE DAILY **  KEEP  FOLLOW  UP  APPOINTMENT  IN  MARCH  FOR  FUTURE  REFILLS** (Patient taking differently: Take 40 mg by mouth every evening. ) 90 tablet 0  . cholecalciferol (VITAMIN D3) 25 MCG (1000 UNIT) tablet Take 1,000 Units by mouth daily.    . colchicine 0.6 MG tablet Take 1 tablet by mouth once daily (Patient taking differently: Take 0.6 mg by mouth daily. ) 30 tablet 2  . ferrous sulfate 325 (65 FE) MG tablet Take 325 mg by mouth daily with breakfast.    . metoprolol tartrate (LOPRESSOR) 25 MG tablet TAKE 1 TABLET BY MOUTH TWICE DAILY **KEEP  FOLLOW  UP  APPOINTMENT  IN  MARCH  FOR  FURTHER  REFILLS** 180 tablet 0  . Multiple Vitamin (MULTIVITAMIN WITH MINERALS) TABS tablet Take 1  tablet by mouth daily.    . Potassium 99 MG TABS Take 99 mg by mouth daily.     . [DISCONTINUED] Hyoscyamine Sulfate SL (LEVSIN/SL) 0.125 MG SUBL Place 0.125 mg under the tongue every 6 (six) hours as needed (bladder spasm). (Patient not taking: Reported on 07/06/2019) 30 tablet 2   No current facility-administered medications on file prior to visit.   Review of Systems All otherwise neg per pt    Objective:   Physical Exam BP (!) 150/86 (BP Location: Left Arm, Patient Position: Sitting, Cuff Size: Large)   Pulse 71   Temp 98.5 F (36.9 C) (Oral)   Ht 6\' 1"  (1.854 m)   Wt 268 lb (121.6 kg)   SpO2 96%   BMI 35.36 kg/m  VS noted,  Constitutional: Pt appears in NAD HENT: Head: NCAT.  Right Ear: External ear normal.  Left Ear: External ear normal.  Eyes: . Pupils are equal, round, and reactive to light. Conjunctivae and EOM are normal Nose: without d/c or deformity Neck: Neck supple. Gross normal ROM Cardiovascular: Normal rate and regular rhythm.   Pulmonary/Chest: Effort normal and breath sounds without rales or wheezing.  Right knee with lateral tenderness and swelling, also right hand with 1st 2nd mcp tender swelling Neurological: Pt is alert. At baseline orientation, motor grossly intact Skin: Skin is warm. No rashes, other new lesions, no LE edema Psychiatric: Pt behavior is normal without agitation  All otherwise neg per pt Lab Results  Component Value Date   WBC 10.4 10/20/2019   HGB 13.3 10/20/2019   HCT 42.7 10/20/2019   PLT 148 (L) 10/20/2019   GLUCOSE 90 10/25/2019   CHOL 126 07/06/2019   TRIG 46.0 07/06/2019   HDL 61.20 07/06/2019   LDLCALC 56 07/06/2019   ALT 16 09/18/2019   AST 20 09/18/2019   NA 142 10/25/2019   K 3.8 10/25/2019   CL 102 10/25/2019   CREATININE 1.72 (H) 10/25/2019   BUN 20 10/25/2019   CO2 29 10/25/2019   TSH 1.39 07/06/2019   PSA 0.27 07/06/2019   INR 1.2 02/09/2019   HGBA1C 6.2 (H) 10/21/2019          Assessment & Plan:

## 2019-11-12 NOTE — Assessment & Plan Note (Signed)
Ok for isopto refill,  to f/u any worsening symptoms or concerns

## 2019-11-12 NOTE — Assessment & Plan Note (Signed)
For surgury f/u soon, may need serous fluid drainage

## 2019-11-12 NOTE — Assessment & Plan Note (Addendum)
stable overall by history and exam, recent data reviewed with pt, and pt to continue medical treatment as before,  to f/u any worsening symptoms or concerns  I spent 41 minutes in preparing to see the patient by review of recent labs, imaging and procedures, obtaining and reviewing separately obtained history, communicating with the patient and family or caregiver, ordering medications, tests or procedures, and documenting clinical information in the EHR including the differential Dx, treatment, and any further evaluation and other management of hyperglycemia, dry eyes, aki on ckd, acute gout, hld, htn, right knee pain, s/p hernia repair

## 2019-11-12 NOTE — Assessment & Plan Note (Signed)
For fu lab,  to f/u any worsening symptoms or concerns  

## 2019-11-12 NOTE — Assessment & Plan Note (Signed)
/  Mild to mod, for predpac asd,  to f/u any worsening symptoms or concerns 

## 2019-11-12 NOTE — Patient Instructions (Addendum)
Please take all new medication as prescribed - the prednisone, and the dry eye drops  Please continue all other medications as before, and refills have been done if requested.  Please have the pharmacy call with any other refills you may need.  Please continue your efforts at being more active, low cholesterol diet, and weight control.  You are otherwise up to date with prevention measures today.  Please keep your appointments with your specialists as you may have planned  You will be contacted regarding the referral for: Orthopedic for the knees  Please go to the LAB at the blood drawing area for the tests to be done - at the [facility] tomorrow or when you can  You will be contacted by phone if any changes need to be made immediately.  Otherwise, you will receive a letter about your results with an explanation, but please check with MyChart first.  Please remember to sign up for MyChart if you have not done so, as this will be important to you in the future with finding out test results, communicating by private email, and scheduling acute appointments online when needed.  Please make an Appointment to return in 6 months, or sooner if needed

## 2019-11-12 NOTE — Assessment & Plan Note (Signed)
With lateral instablity, for ortho referral,  to f/u any worsening symptoms or concerns

## 2019-11-13 ENCOUNTER — Other Ambulatory Visit (INDEPENDENT_AMBULATORY_CARE_PROVIDER_SITE_OTHER): Payer: Medicare Other

## 2019-11-13 DIAGNOSIS — R7309 Other abnormal glucose: Secondary | ICD-10-CM

## 2019-11-13 DIAGNOSIS — E538 Deficiency of other specified B group vitamins: Secondary | ICD-10-CM

## 2019-11-13 DIAGNOSIS — N183 Chronic kidney disease, stage 3 unspecified: Secondary | ICD-10-CM

## 2019-11-13 DIAGNOSIS — E611 Iron deficiency: Secondary | ICD-10-CM | POA: Diagnosis not present

## 2019-11-13 DIAGNOSIS — E785 Hyperlipidemia, unspecified: Secondary | ICD-10-CM | POA: Diagnosis not present

## 2019-11-13 DIAGNOSIS — M109 Gout, unspecified: Secondary | ICD-10-CM

## 2019-11-13 DIAGNOSIS — E559 Vitamin D deficiency, unspecified: Secondary | ICD-10-CM

## 2019-11-13 DIAGNOSIS — N179 Acute kidney failure, unspecified: Secondary | ICD-10-CM | POA: Diagnosis not present

## 2019-11-13 LAB — BASIC METABOLIC PANEL
BUN: 25 mg/dL — ABNORMAL HIGH (ref 6–23)
CO2: 33 mEq/L — ABNORMAL HIGH (ref 19–32)
Calcium: 9.3 mg/dL (ref 8.4–10.5)
Chloride: 101 mEq/L (ref 96–112)
Creatinine, Ser: 1.93 mg/dL — ABNORMAL HIGH (ref 0.40–1.50)
GFR: 41.93 mL/min — ABNORMAL LOW (ref >60.00)
Glucose, Bld: 100 mg/dL — ABNORMAL HIGH (ref 70–99)
Potassium: 3 mEq/L — ABNORMAL LOW (ref 3.5–5.1)
Sodium: 141 mEq/L (ref 135–145)

## 2019-11-13 LAB — IBC PANEL
Iron: 68 ug/dL (ref 42–165)
Saturation Ratios: 19.7 % — ABNORMAL LOW (ref 20.0–50.0)
Transferrin: 247 mg/dL (ref 212.0–360.0)

## 2019-11-13 LAB — LIPID PANEL
Cholesterol: 118 mg/dL (ref 0–200)
HDL: 45.9 mg/dL (ref >39.00)
LDL Cholesterol: 56 mg/dL (ref 0–99)
NonHDL: 72.09
Total CHOL/HDL Ratio: 3
Triglycerides: 79 mg/dL (ref 0.0–149.0)
VLDL: 15.8 mg/dL (ref 0.0–40.0)

## 2019-11-13 LAB — HEPATIC FUNCTION PANEL
ALT: 17 U/L (ref 0–53)
AST: 18 U/L (ref 0–37)
Albumin: 4.1 g/dL (ref 3.5–5.2)
Alkaline Phosphatase: 95 U/L (ref 39–117)
Bilirubin, Direct: 0.2 mg/dL (ref 0.0–0.3)
Total Bilirubin: 0.8 mg/dL (ref 0.2–1.2)
Total Protein: 7.4 g/dL (ref 6.0–8.3)

## 2019-11-13 LAB — URIC ACID: Uric Acid, Serum: 7 mg/dL (ref 4.0–7.8)

## 2019-11-13 LAB — HEMOGLOBIN A1C: Hgb A1c MFr Bld: 5.9 % (ref 4.6–6.5)

## 2019-11-13 LAB — VITAMIN B12: Vitamin B-12: 637 pg/mL (ref 211–911)

## 2019-11-13 LAB — VITAMIN D 25 HYDROXY (VIT D DEFICIENCY, FRACTURES): VITD: 56.36 ng/mL (ref 30.00–100.00)

## 2019-11-14 ENCOUNTER — Other Ambulatory Visit: Payer: Self-pay | Admitting: Internal Medicine

## 2019-11-14 ENCOUNTER — Encounter: Payer: Self-pay | Admitting: Internal Medicine

## 2019-11-14 MED ORDER — POTASSIUM CHLORIDE ER 10 MEQ PO TBCR: 10.0000 meq | EXTENDED_RELEASE_TABLET | Freq: Every day | ORAL | 0 refills | Status: DC

## 2019-11-21 ENCOUNTER — Other Ambulatory Visit: Payer: Self-pay | Admitting: *Deleted

## 2019-11-22 ENCOUNTER — Other Ambulatory Visit (HOSPITAL_COMMUNITY): Payer: Self-pay | Admitting: General Surgery

## 2019-11-22 DIAGNOSIS — Z9889 Other specified postprocedural states: Secondary | ICD-10-CM

## 2019-11-22 NOTE — Patient Outreach (Signed)
[facility]) Care Management  11/22/2019  [name] December 25, 1950 [id number]   CSW spoke with pt who reports the meal delivery is going well.  He requests they not send eggs and CSW has offered to call them and make them aware of this request.  Pt also would like to add "one more delivery" to get him through his recovery. Pt was seen my MD On 6/16- he reports they are going to scan the belly to see if there is "fluid from the surgery".  Pt in good spirits and appreciative of call. CSW will plan a follow up call regarding meals, etc.  [name], MSW, LCSW Clinical Social Worker  [place], MSW, [place] Worker  [place] [phone]

## 2019-11-27 ENCOUNTER — Encounter (HOSPITAL_COMMUNITY): Payer: Self-pay | Admitting: Radiology

## 2019-11-27 NOTE — Progress Notes (Signed)
Omari Mcmanaway Male, 69 y.o., 09/29/1950 MRN:  [id number] Phone:  [phone] Jerilynn Mages) PCP:  Biagio Borg, MD Coverage:  Albert Lea, Janet Parks, LCSW 11/30/2019 at 1:00 PM  RE: [place] Aspirtation (Abdomen) Received: Duaine Dredge, MD  Jillyn Hidden Ok   US aspiration paraumbilical fluid   DDH       Previous Messages   ----- Message -----  From: Garth Bigness D  Sent: 11/22/2019  4:22 PM EDT  To: [facility] Procedure Requests  Subject: [place] Aspirtation (Abdomen)             Procedure: US Aspiration of Abdomen   Reason: Post-operative state, aspiration of seroma s/p hernia repair   History: CT in computer   Provider: Ralene Ok   Provider Contact: [phone]

## 2019-11-30 ENCOUNTER — Other Ambulatory Visit: Payer: Self-pay | Admitting: *Deleted

## 2019-12-03 NOTE — Patient Outreach (Signed)
[facility] [facility]) Care Management  12/03/2019  [name] October 05, 1950 [id number]   CSW spoke with pt who reports he is doing well- has plans for the procedure to determine the "fluid in my my belly" and is optimistic.  Pt reports he has transportation to this appointment (has to have someone there to drive him home) and will contact CSW if needs change.   Pt in good spirits; still receiving the meal delivery and appreciative of this.   CSW will plan a follow up outreach call again in the next 10 business days.    [name], MSW, [place] Worker  [place] [phone]

## 2019-12-04 ENCOUNTER — Other Ambulatory Visit: Payer: Self-pay | Admitting: Radiology

## 2019-12-04 ENCOUNTER — Other Ambulatory Visit: Payer: Self-pay | Admitting: *Deleted

## 2019-12-04 NOTE — Patient Outreach (Signed)
[facility]) Care Management  12/04/2019  Naythan Douthit August 07, 1950 [id number]  Telephone outreach.  Mr. Mogel is doing well. He says he is having a scan tomorrow to check on fluid in his abdomen. (He is scheduled for an US of the kidneys).  He denies any acute health issues at this time. He has all his medications. He has seen his primary care doctor.  Pt new long term goal will be to call NP or MD when he has health issues early for evaluation, intervention and to avoid complications and hospitalization.  I will call him again in 3 months.  [name] Myrtie Neither, MSN, [facility] Gerontological Nurse Practitioner [facility] Care Management [phone]

## 2019-12-05 ENCOUNTER — Ambulatory Visit (HOSPITAL_COMMUNITY)
Admission: RE | Admit: 2019-12-05 | Discharge: 2019-12-05 | Disposition: A | Discharge status: Home / Self Care | Payer: Medicare Other | Source: Ambulatory Visit | Attending: General Surgery | Admitting: General Surgery

## 2019-12-05 ENCOUNTER — Other Ambulatory Visit (HOSPITAL_COMMUNITY): Payer: Self-pay | Admitting: General Surgery

## 2019-12-05 ENCOUNTER — Encounter (HOSPITAL_COMMUNITY): Payer: Self-pay

## 2019-12-05 ENCOUNTER — Other Ambulatory Visit: Payer: Self-pay

## 2019-12-05 DIAGNOSIS — K91872 Postprocedural seroma of a digestive system organ or structure following a digestive system procedure: Secondary | ICD-10-CM | POA: Insufficient documentation

## 2019-12-05 DIAGNOSIS — E785 Hyperlipidemia, unspecified: Secondary | ICD-10-CM | POA: Diagnosis not present

## 2019-12-05 DIAGNOSIS — Z9889 Other specified postprocedural states: Secondary | ICD-10-CM

## 2019-12-05 DIAGNOSIS — L7634 Postprocedural seroma of skin and subcutaneous tissue following other procedure: Secondary | ICD-10-CM | POA: Diagnosis not present

## 2019-12-05 DIAGNOSIS — Z79899 Other long term (current) drug therapy: Secondary | ICD-10-CM | POA: Diagnosis not present

## 2019-12-05 DIAGNOSIS — Z87891 Personal history of nicotine dependence: Secondary | ICD-10-CM | POA: Insufficient documentation

## 2019-12-05 DIAGNOSIS — N183 Chronic kidney disease, stage 3 unspecified: Secondary | ICD-10-CM | POA: Diagnosis not present

## 2019-12-05 DIAGNOSIS — Y838 Other surgical procedures as the cause of abnormal reaction of the patient, or of later complication, without mention of misadventure at the time of the procedure: Secondary | ICD-10-CM | POA: Insufficient documentation

## 2019-12-05 DIAGNOSIS — E559 Vitamin D deficiency, unspecified: Secondary | ICD-10-CM | POA: Insufficient documentation

## 2019-12-05 DIAGNOSIS — K469 Unspecified abdominal hernia without obstruction or gangrene: Secondary | ICD-10-CM | POA: Diagnosis not present

## 2019-12-05 DIAGNOSIS — I129 Hypertensive chronic kidney disease with stage 1 through stage 4 chronic kidney disease, or unspecified chronic kidney disease: Secondary | ICD-10-CM | POA: Insufficient documentation

## 2019-12-05 LAB — CBC
HCT: 41.7 % (ref 39.0–52.0)
Hemoglobin: 13.2 g/dL (ref 13.0–17.0)
MCH: 28.7 pg (ref 26.0–34.0)
MCHC: 31.7 g/dL (ref 30.0–36.0)
MCV: 90.7 fL (ref 80.0–100.0)
Platelets: 177 10*3/uL (ref 150–400)
RBC: 4.6 MIL/uL (ref 4.22–5.81)
RDW: 14.8 % (ref 11.5–15.5)
WBC: 5.4 10*3/uL (ref 4.0–10.5)
nRBC: 0 % (ref 0.0–0.2)

## 2019-12-05 LAB — PROTIME-INR
INR: 1.2 (ref 0.8–1.2)
Prothrombin Time: 14.3 seconds (ref 11.4–15.2)

## 2019-12-05 MED ORDER — FENTANYL CITRATE (PF) 100 MCG/2ML IJ SOLN
INTRAMUSCULAR | Status: AC
  Filled 2019-12-05: qty 2

## 2019-12-05 MED ORDER — LIDOCAINE HCL (PF) 1 % IJ SOLN
INTRAMUSCULAR | Status: AC
  Filled 2019-12-05: qty 30

## 2019-12-05 MED ORDER — SODIUM CHLORIDE 0.9 % IV SOLN: INTRAVENOUS | Status: DC

## 2019-12-05 MED ORDER — LIDOCAINE-EPINEPHRINE 1 %-1:100000 IJ SOLN
INTRAMUSCULAR | Status: AC
  Filled 2019-12-05: qty 1

## 2019-12-05 MED ORDER — MIDAZOLAM HCL 2 MG/2ML IJ SOLN
INTRAMUSCULAR | Status: AC
  Filled 2019-12-05: qty 2

## 2019-12-05 NOTE — Sedation Documentation (Signed)
Per Dr Pascal Lux, patient does not need any monitoring and can be discharged from [place].  Verbal orders read back and verified.

## 2019-12-05 NOTE — Sedation Documentation (Signed)
Per Dr Pascal Lux, no sedation needed, only aspiration per MD.  Verbal orders read back and verified.

## 2019-12-05 NOTE — Procedures (Signed)
Pre Procedure Dx: Abdominal wall fluid collection c/w seroma.  Post Procedural Dx: Same  Technically successful US aspiration of @ 50 cc of serous fluid from complex, easily collapsible fluid collection within the midline of the ventral abdominal wall.  EBL: None No immediate complications.   Ronny Bacon, MD Pager #: [phone]

## 2019-12-05 NOTE — H&P (Signed)
Chief Complaint: Patient was seen in consultation today for paraumbilical fluid collection aspiration at the request of Ramirez,Armando  Referring Physician(s): Ramirez,Armando  Supervising Physician: Sandi Mariscal  Patient Status: [facility] - Out-pt  History of Present Illness: [name] is a 69 y.o. male   Hernia repair 10/17/19 with Dr Rosendo Gros Worsening abdominal distension in last few weeks  CT month ASN:[id number] postsurgical changes along the anterior abdominal wall which likely correspond with the site of abdominal hernia repair. There is diffuse fluid thickening along the anterior fascia which may reflect some seroma or postoperative fluid. Fairly large bilobed collection is seen anteriorly along the the inferior extent fat and mixed attenuation fluid collection possibly reflecting a seroma and/or hematoma versus surgical material within the collection. A second small anterior outpouching is seen along the superior margin containing fat, fluid and a small amount of gas. No bowel containing hernia.  Scheduled here today for seroma - paraumbilical fluid aspiration  Past Medical History:  Diagnosis Date  . Anemia    normal Fe, nl B12, nl retic, nl EPO July '13  . Aortic regurgitation    Moderate AI 04/17/19 echo  . Blood transfusion without reported diagnosis   . BPH (benign prostatic hyperplasia)   . Chronic back pain   . Chronic kidney disease    CKD III, obstructive nephropathy  . Diverticulosis   . Dysuria   . Elevated PSA, greater than or equal to 20 ng/ml June '13   PSA 107  . Hemorrhoids, internal, with bleeding, prolapse 09/19/2014  . Hyperlipidemia 05/29/2014  . Hypertension   . Obstructive uropathy 11/27/2015  . Pre-diabetes    per patient "reduced sugar intake"  . Renal insufficiency   . Tuberculosis    h/o PPD +  . UTI (urinary tract infection)   . Vitamin D deficiency 09/13/2017    Past Surgical History:  Procedure Laterality Date  . CARPAL  TUNNEL RELEASE Right   . COLONOSCOPY    . HEMORRHOID BANDING    . INSERTION OF MESH N/A 10/17/2019   Procedure: Insertion Of Mesh;  Surgeon: Ralene Ok, MD;  Location: [place];  Service: General;  Laterality: N/A;  . SPLENECTOMY    . XI ROBOTIC ASSISTED SIMPLE PROSTATECTOMY N/A 03/09/2019   Procedure: XI ROBOTIC ASSISTED SIMPLE PROSTATECTOMY;  Surgeon: Cleon Gustin, MD;  [facility];  Service: Urology;  Laterality: N/A;  . XI ROBOTIC ASSISTED VENTRAL HERNIA N/A 10/17/2019   Procedure: XI ROBOTIC ASSISTED INCISIONAL HERNIA REPAIR WITH MESH;  Surgeon: Ralene Ok, MD;  Location: [place];  Service: General;  Laterality: N/A;    Allergies: Patient has no known allergies.  Medications: Prior to Admission medications   Medication Sig Start Date End Date Taking? Authorizing Provider  acetaminophen (TYLENOL) 500 MG tablet Take 1,000 mg by mouth every 6 (six) hours as needed for moderate pain.    Yes [provider]  allopurinol (ZYLOPRIM) 100 MG tablet Take 2 tablets by mouth once daily Patient taking differently: Take 200 mg by mouth daily.  08/06/19  Yes Biagio Borg, MD  amLODipine (NORVASC) 5 MG tablet Take 1 tablet (5 mg total) by mouth daily. 04/06/19  Yes Nahser, Wonda Cheng, MD  aspirin EC 81 MG tablet Take 81 mg by mouth daily.   Yes [provider]  atorvastatin (LIPITOR) 40 MG tablet TAKE 1 TABLET BY MOUTH ONCE DAILY **  KEEP  FOLLOW  UP  APPOINTMENT  IN  [facility]  FOR  FUTURE  REFILLS** Patient  taking differently: Take 40 mg by mouth every evening.  09/03/19  Yes Biagio Borg, MD  cholecalciferol (VITAMIN D3) 25 MCG (1000 UNIT) tablet Take 1,000 Units by mouth daily.   Yes [provider]  colchicine 0.6 MG tablet Take 1 tablet by mouth once daily Patient taking differently: Take 0.6 mg by mouth daily.  04/11/19  Yes Biagio Borg, MD  ferrous sulfate 325 (65 FE) MG tablet Take 325 mg by mouth daily with breakfast.   Yes [provider]    hydroxypropyl methylcellulose / hypromellose (ISOPTO TEARS / GONIOVISC) 2.5 % ophthalmic solution Place 1-2 drops into both eyes 3 (three) times daily as needed (dry/irritated eyes.). 11/12/19  Yes Biagio Borg, MD  metoprolol tartrate (LOPRESSOR) 25 MG tablet TAKE 1 TABLET BY MOUTH TWICE DAILY **KEEP  FOLLOW  UP  APPOINTMENT  IN  [facility]  FOR  FURTHER  REFILLS** Patient taking differently: Take 25 mg by mouth 2 (two) times daily.  10/17/19  Yes Biagio Borg, MD  Multiple Vitamin (MULTIVITAMIN WITH MINERALS) TABS tablet Take 1 tablet by mouth daily.   Yes [provider]  Potassium 99 MG TABS Take 99 mg by mouth daily.    Yes [provider]  potassium chloride (KLOR-CON) 10 MEQ tablet Take 1 tablet (10 mEq total) by mouth daily. For 3 days Patient not taking: Reported on 11/27/2019 11/14/19   Biagio Borg, MD  predniSONE (DELTASONE) 10 MG tablet 3 tabs by mouth per day for 3 days,2tabs per day for 3 days,1tab per day for 3 days Patient not taking: Reported on 11/27/2019 11/12/19   Biagio Borg, MD  Hyoscyamine Sulfate SL (LEVSIN/SL) 0.125 MG SUBL Place 0.125 mg under the tongue every 6 (six) hours as needed (bladder spasm). Patient not taking: Reported on 07/06/2019 02/11/19 09/18/19  Irine Seal, MD     Family History  Problem Relation Age of Onset  . Diabetes Mother   . Stroke Brother   . Hearing loss Brother   . Colon cancer Neg Hx   . Rectal cancer Neg Hx   . Stomach cancer Neg Hx   . Esophageal cancer Neg Hx     Social History   Socioeconomic History  . Marital status: Single    Spouse name: Not on file  . Number of children: 2  . Years of education: 56  . Highest education level: Some college, no degree  Occupational History  . Occupation: Lobbyist: [employer]    Comment: unemployed  Tobacco Use  . Smoking status: Former Smoker    Types: Cigarettes    Quit date: 02/26/1980    Years since quitting: 39.8  . Smokeless tobacco: Never Used  .  Tobacco comment: Quit 1981  Vaping Use  . Vaping Use: Never used  Substance and Sexual Activity  . Alcohol use: No    Alcohol/week: 0.0 standard drinks    Comment: Quit 2004  . Drug use: No  . Sexual activity: Not Currently  Other Topics Concern  . Not on file  Social History Narrative   Native of [place], raised poor farming community. . To [place] '78 - finished College, all but Arts administrator. Married - wife in [place], blocked from [place] to [place]. 1 son '90; 1 dtr '93. 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 Resources depleted.   Right handed.   Caffeine Hot daily one daily.   Social Determinants of Health  Financial Resource Strain: Medium Risk  . Difficulty of Paying Living Expenses: Somewhat hard  Food Insecurity: No Food Insecurity  . Worried About Charity fundraiser in the Last Year: Never true  . Ran Out of Food in the Last Year: Never true  Transportation Needs: Unmet Transportation Needs  . Lack of Transportation (Medical): Yes  . Lack of Transportation (Non-Medical): Yes  Physical Activity: Insufficiently Active  . Days of Exercise per Week: 3 days  . Minutes of Exercise per Session: 10 min  Stress: No Stress Concern Present  . Feeling of Stress : Only a little  Social Connections: Socially Integrated  . Frequency of Communication with Friends and Family: Three times a week  . Frequency of Social Gatherings with Friends and Family: Once a week  . Attends Religious Services: 1 to 4 times per year  . Active Member of Clubs or Organizations: Yes  . Attends Archivist Meetings: 1 to 4 times per year  . Marital Status: Married     Review of Systems: A 12 point ROS discussed and pertinent positives are indicated in the HPI above.  All other systems are negative.  Review of Systems  Constitutional: Negative for activity change and fever.  Gastrointestinal: Positive for abdominal distention. Negative for abdominal pain.    Psychiatric/Behavioral: Negative for behavioral problems and confusion.    Vital Signs: BP (!) 147/101   Pulse 73   Temp 98.5 F (36.9 C) (Oral)   Resp 18   Ht 6\' 1"  (1.854 m)   Wt 260 lb (117.9 kg)   SpO2 98%   BMI 34.30 kg/m   Physical Exam Vitals reviewed.  Cardiovascular:     Rate and Rhythm: Normal rate and regular rhythm.     Heart sounds: Normal heart sounds.  Pulmonary:     Breath sounds: Normal breath sounds.  Abdominal:     General: There is distension.     Palpations: Abdomen is soft.     Tenderness: There is no abdominal tenderness.  Musculoskeletal:        General: Normal range of motion.  Skin:    General: Skin is warm.  Neurological:     Mental Status: He is alert and oriented to person, place, and time.  Psychiatric:        Behavior: Behavior normal.     Imaging: No results found.  Labs:  CBC: Recent Labs    10/12/19 1042 10/18/19 0159 10/20/19 1139 12/05/19 1201  WBC 5.1 6.5 10.4 5.4  HGB 14.1 12.9* 13.3 13.2  HCT 44.9 40.5 42.7 41.7  PLT 201 170 148* 177    COAGS: Recent Labs    01/04/19 1344 02/09/19 1336  INR 1.0 1.2    BMP: Recent Labs    10/22/19 0551 10/22/19 0551 10/23/19 0212 10/24/19 0253 10/25/19 0312 11/13/19 1226  NA 137   < > 139 143 142 141  K 3.3*   < > 3.4* 3.6 3.8 3.0*  CL 99   < > 102 105 102 101  CO2 28   < > 28 30 29  33*  GLUCOSE 116*   < > 137* 93 90 100*  BUN 54*   < > 48* 31* 20 25*  CALCIUM 8.6*   < > 8.5* 8.6* 8.6* 9.3  CREATININE 3.31*   < > 2.70* 1.95* 1.72* 1.93*  GFRNONAA 18*  --  23* 34* 40*  --   GFRAA 21*  --  27* 40* 46*  --    < > =  values in this interval not displayed.    LIVER FUNCTION TESTS: Recent Labs    07/06/19 1224 07/06/19 1224 09/18/19 0543 10/22/19 0551 10/23/19 0212 10/24/19 0253 10/25/19 0312 11/13/19 1226  BILITOT 0.5  --  1.0  --   --   --   --  0.8  AST 17  --  20  --   --   --   --  18  ALT 13  --  16  --   --   --   --  17  ALKPHOS 95  --  88  --    --   --   --  95  PROT 6.9  --  7.8  --   --   --   --  7.4  ALBUMIN 3.9   < > 3.8   < > 2.6* 2.5* 2.7* 4.1   < > = values in this interval not displayed.    TUMOR MARKERS: No results for input(s): AFPTM, CEA, CA199, CHROMGRNA in the last 8760 hours.  Assessment and Plan:  Post hernia repair abd distension Paraumbilical fluid collection Aspiration today Risks and benefits of paraumbilical fluid aspiration was discussed with the patient and/or patient's family including, but not limited to bleeding, infection, damage to adjacent structures or low yield requiring additional tests.  All of the questions were answered and there is agreement to proceed. Consent signed and in chart.   Thank you for this interesting consult.  I greatly enjoyed meeting Nishan Ovens and look forward to participating in their care.  A copy of this report was sent to the requesting provider on this date.  Electronically Signed: Lavonia Drafts, PA-C 12/05/2019, 12:40 PM   I spent a total of 30 minutes    in face to face in clinical consultation, greater than 50% of which was counseling/coordinating care for paraumbilical aspiration

## 2019-12-12 ENCOUNTER — Other Ambulatory Visit: Payer: Self-pay | Admitting: *Deleted

## 2019-12-12 NOTE — Patient Outreach (Signed)
[facility]) Care Management  12/12/2019  [name] [birth year] [id number]   CSW spoke with pt today who reports he is doing well. Per pt, they were able to get some/most of the fluid off of his belly and he has having less discomfort.  "I need to lose some weight too they said and that will help".  Pt has enjoyed the meal delivery program and does not feel it needs to be extended.  Pt denies any other needs at this time.  Pt has CSW contact info and is encouraged to call if needs arise, transportation needs, etc.   Pt appreciative of CSW support.  CSW will sign off at this time and will advise PCP and [facility] team to above.   [name], MSW, [employer] Worker  [place] [phone]

## 2019-12-17 DIAGNOSIS — M25561 Pain in right knee: Secondary | ICD-10-CM | POA: Diagnosis not present

## 2019-12-19 ENCOUNTER — Other Ambulatory Visit: Payer: Self-pay | Admitting: Internal Medicine

## 2019-12-24 ENCOUNTER — Ambulatory Visit: Payer: Self-pay | Admitting: *Deleted

## 2019-12-30 ENCOUNTER — Other Ambulatory Visit: Payer: Self-pay | Admitting: Internal Medicine

## 2019-12-30 NOTE — Telephone Encounter (Signed)
Please refill as per office routine med refill policy (all routine meds refilled for 3 mo or monthly per pt preference up to one year from last visit, then month to month grace period for 3 mo, then further med refills will have to be denied)  

## 2019-12-31 DIAGNOSIS — R3915 Urgency of urination: Secondary | ICD-10-CM | POA: Diagnosis not present

## 2020-01-18 ENCOUNTER — Other Ambulatory Visit: Payer: Self-pay | Admitting: Internal Medicine

## 2020-01-18 NOTE — Telephone Encounter (Signed)
Please refill as per office routine med refill policy (all routine meds refilled for 3 mo or monthly per pt preference up to one year from last visit, then month to month grace period for 3 mo, then further med refills will have to be denied)  

## 2020-01-19 ENCOUNTER — Other Ambulatory Visit: Payer: Self-pay | Admitting: Internal Medicine

## 2020-01-19 NOTE — Telephone Encounter (Signed)
Please refill as per office routine med refill policy (all routine meds refilled for 3 mo or monthly per pt preference up to one year from last visit, then month to month grace period for 3 mo, then further med refills will have to be denied)  

## 2020-03-03 DIAGNOSIS — R3915 Urgency of urination: Secondary | ICD-10-CM | POA: Diagnosis not present

## 2020-03-04 ENCOUNTER — Other Ambulatory Visit: Payer: Self-pay

## 2020-03-04 ENCOUNTER — Other Ambulatory Visit: Payer: Self-pay | Admitting: *Deleted

## 2020-03-04 NOTE — Patient Outreach (Signed)
[place] [facility]) Care Management  03/04/2020  [name] [birth year] [id number]   Telephone outreach:  [name] saw his urologist yesterday. UA and bladder scan was normal. Cont to have 2-3 nocturnal voids.  OA of R knee - seeing orthopedics for this. Had injection several mo's ago. Takes APAP, allopurinol, glucosamine. Uses cane when needs extra assist.   Plans on getting flu vaccine soon and COVID booster when available.  Needs to make an appt to see Dr. Jeneen Rinks in Dec per Dr. Jeneen Rinks last note.   We agreed to talk again in 6 months (March 2022).  [name] Myrtie Neither, MSN, [facility] Gerontological Nurse Practitioner [facility] Care Management [phone]

## 2020-03-27 DIAGNOSIS — N390 Urinary tract infection, site not specified: Secondary | ICD-10-CM | POA: Diagnosis not present

## 2020-03-27 DIAGNOSIS — N189 Chronic kidney disease, unspecified: Secondary | ICD-10-CM | POA: Diagnosis not present

## 2020-03-27 DIAGNOSIS — I129 Hypertensive chronic kidney disease with stage 1 through stage 4 chronic kidney disease, or unspecified chronic kidney disease: Secondary | ICD-10-CM | POA: Diagnosis not present

## 2020-03-27 DIAGNOSIS — N2581 Secondary hyperparathyroidism of renal origin: Secondary | ICD-10-CM | POA: Diagnosis not present

## 2020-03-27 DIAGNOSIS — N183 Chronic kidney disease, stage 3 unspecified: Secondary | ICD-10-CM | POA: Diagnosis not present

## 2020-03-27 DIAGNOSIS — D631 Anemia in chronic kidney disease: Secondary | ICD-10-CM | POA: Diagnosis not present

## 2020-03-28 ENCOUNTER — Other Ambulatory Visit: Payer: Self-pay | Admitting: Internal Medicine

## 2020-03-28 NOTE — Telephone Encounter (Signed)
Please refill as per office routine med refill policy (all routine meds refilled for 3 mo or monthly per pt preference up to one year from last visit, then month to month grace period for 3 mo, then further med refills will have to be denied)  

## 2020-04-13 ENCOUNTER — Other Ambulatory Visit: Payer: Self-pay | Admitting: Cardiovascular Disease

## 2020-05-03 ENCOUNTER — Other Ambulatory Visit: Payer: Self-pay | Admitting: Internal Medicine

## 2020-05-03 NOTE — Telephone Encounter (Signed)
Please refill as per office routine med refill policy (all routine meds refilled for 3 mo or monthly per pt preference up to one year from last visit, then month to month grace period for 3 mo, then further med refills will have to be denied)  

## 2020-05-12 ENCOUNTER — Ambulatory Visit (INDEPENDENT_AMBULATORY_CARE_PROVIDER_SITE_OTHER): Payer: Medicare Other | Admitting: Internal Medicine

## 2020-05-12 ENCOUNTER — Encounter: Payer: Self-pay | Admitting: Internal Medicine

## 2020-05-12 ENCOUNTER — Other Ambulatory Visit: Payer: Self-pay

## 2020-05-12 VITALS — BP 152/100 | HR 69 | Temp 98.3°F | Ht 73.0 in | Wt 270.0 lb

## 2020-05-12 DIAGNOSIS — E7849 Other hyperlipidemia: Secondary | ICD-10-CM | POA: Diagnosis not present

## 2020-05-12 DIAGNOSIS — E559 Vitamin D deficiency, unspecified: Secondary | ICD-10-CM

## 2020-05-12 DIAGNOSIS — N1831 Chronic kidney disease, stage 3a: Secondary | ICD-10-CM | POA: Diagnosis not present

## 2020-05-12 DIAGNOSIS — I10 Essential (primary) hypertension: Secondary | ICD-10-CM | POA: Diagnosis not present

## 2020-05-12 DIAGNOSIS — R7309 Other abnormal glucose: Secondary | ICD-10-CM | POA: Diagnosis not present

## 2020-05-12 DIAGNOSIS — Z23 Encounter for immunization: Secondary | ICD-10-CM

## 2020-05-12 MED ORDER — AMLODIPINE BESYLATE 10 MG PO TABS: 10.0000 mg | ORAL_TABLET | Freq: Every day | ORAL | 3 refills | Status: DC

## 2020-05-12 MED ORDER — METOPROLOL TARTRATE 50 MG PO TABS: 50.0000 mg | ORAL_TABLET | Freq: Two times a day (BID) | ORAL | 3 refills | Status: DC

## 2020-05-12 NOTE — Assessment & Plan Note (Signed)
Cont oral replacement 

## 2020-05-12 NOTE — Assessment & Plan Note (Addendum)
Mod to severe uncontrolled, for increased amlod 10 qd, and metoprolol 50 bid, with f/u 1 mo prior to planned trip to [place] to see his wife he has not seen for over 8 yrs  I spent 31 minutes in preparing to see the patient by review of recent labs, imaging and procedures, obtaining and reviewing separately obtained history, communicating with the patient and family or caregiver, ordering medications, tests or procedures, and documenting clinical information in the EHR including the differential Dx, treatment, and any further evaluation and other management of htn, hld, ckd, hyperglycemia, vit d def

## 2020-05-12 NOTE — Assessment & Plan Note (Signed)
stable overall by history and exam, recent data reviewed with pt, and pt to continue medical treatment as before,  to f/u any worsening symptoms or concerns Lab Results  Component Value Date   LDLCALC 56 11/13/2019

## 2020-05-12 NOTE — Progress Notes (Signed)
Subjective:    Patient ID: [name], male    DOB: 27-Dec-1950, 69 y.o.   MRN: [id number]  HPI  Here to f/u; overall doing ok,  Pt denies chest pain, increasing sob or doe, wheezing, orthopnea, PND, increased LE swelling, palpitations, dizziness or syncope.  Pt denies new neurological symptoms such as new headache, or facial or extremity weakness or numbness.  Pt denies polydipsia, polyuria, or low sugar episode.  Pt states overall good compliance with meds, mostly trying to follow appropriate diet, with wt overall stable,  but little exercise however. No new complaints.  Has seen renal with labs recently, told his sugar is mildly high.  BP Readings from Last 3 Encounters:  05/12/20 (!) 152/100  12/05/19 (!) 165/98  11/12/19 (!) 150/86   Wt Readings from Last 3 Encounters:  05/12/20 270 lb (122.5 kg)  12/05/19 260 lb (117.9 kg)  11/12/19 268 lb (121.6 kg)   Past Medical History:  Diagnosis Date  . Anemia    normal Fe, nl B12, nl retic, nl EPO July '13  . Aortic regurgitation    Moderate AI 04/17/19 echo  . Blood transfusion without reported diagnosis   . BPH (benign prostatic hyperplasia)   . Chronic back pain   . Chronic kidney disease    CKD III, obstructive nephropathy  . Diverticulosis   . Dysuria   . Elevated PSA, greater than or equal to 20 ng/ml June '13   PSA 107  . Hemorrhoids, internal, with bleeding, prolapse 09/19/2014  . Hyperlipidemia 05/29/2014  . Hypertension   . Obstructive uropathy 11/27/2015  . Pre-diabetes    per patient "reduced sugar intake"  . Renal insufficiency   . Tuberculosis    h/o PPD +  . UTI (urinary tract infection)   . Vitamin D deficiency 09/13/2017   Past Surgical History:  Procedure Laterality Date  . CARPAL TUNNEL RELEASE Right   . COLONOSCOPY    . HEMORRHOID BANDING    . INSERTION OF MESH N/A 10/17/2019   Procedure: Insertion Of Mesh;  Surgeon: Ralene Ok, MD;  Location: [place];  Service: General;  Laterality: N/A;  .  SPLENECTOMY    . XI ROBOTIC ASSISTED SIMPLE PROSTATECTOMY N/A 03/09/2019   Procedure: XI ROBOTIC ASSISTED SIMPLE PROSTATECTOMY;  Surgeon: Cleon Gustin, MD;  [facility];  Service: Urology;  Laterality: N/A;  . XI ROBOTIC ASSISTED VENTRAL HERNIA N/A 10/17/2019   Procedure: XI ROBOTIC ASSISTED INCISIONAL HERNIA REPAIR WITH MESH;  Surgeon: Ralene Ok, MD;  Location: [place];  Service: General;  Laterality: N/A;    reports that he quit smoking about 40 years ago. His smoking use included cigarettes. He has never used smokeless tobacco. He reports that he does not drink alcohol and does not use drugs. family history includes Diabetes in his mother; Hearing loss in his brother; Stroke in his brother. No Known Allergies Current Outpatient Medications on File Prior to Visit  Medication Sig Dispense Refill  . acetaminophen (TYLENOL) 500 MG tablet Take 1,000 mg by mouth every 6 (six) hours as needed for moderate pain.     Marland Kitchen allopurinol (ZYLOPRIM) 100 MG tablet Take 2 tablets by mouth once daily 180 tablet 0  . aspirin EC 81 MG tablet Take 81 mg by mouth daily.    Marland Kitchen atorvastatin (LIPITOR) 40 MG tablet TAKE 1 TABLET BY MOUTH ONCE DAILY *KEEP  FOLLOW  UP  APPOINTMENT  IN  MARCH  FOR  REFILLS* 90 tablet 0  . cholecalciferol (VITAMIN  D3) 25 MCG (1000 UNIT) tablet Take 1,000 Units by mouth daily.    . colchicine 0.6 MG tablet Take 1 tablet by mouth once daily 30 tablet 0  . ferrous sulfate 325 (65 FE) MG tablet Take 325 mg by mouth daily with breakfast.    . hydroxypropyl methylcellulose / hypromellose (ISOPTO TEARS / GONIOVISC) 2.5 % ophthalmic solution Place 1-2 drops into both eyes 3 (three) times daily as needed (dry/irritated eyes.). 15 mL 2  . Multiple Vitamin (MULTIVITAMIN WITH MINERALS) TABS tablet Take 1 tablet by mouth daily.    . Potassium 99 MG TABS Take 99 mg by mouth daily.     . [DISCONTINUED] Hyoscyamine Sulfate SL (LEVSIN/SL) 0.125 MG SUBL Place 0.125 mg under the tongue every 6  (six) hours as needed (bladder spasm). (Patient not taking: Reported on 07/06/2019) 30 tablet 2   No current facility-administered medications on file prior to visit.   Review of Systems All otherwise neg per pt     Objective:   Physical Exam BP (!) 152/100 (BP Location: Left Arm, Patient Position: Sitting, Cuff Size: Large)   Pulse 69   Temp 98.3 F (36.8 C) (Oral)   Ht 6\' 1"  (1.854 m)   Wt 270 lb (122.5 kg)   SpO2 95%   BMI 35.62 kg/m  VS noted,  Constitutional: Pt appears in NAD HENT: Head: NCAT.  Right Ear: External ear normal.  Left Ear: External ear normal.  Eyes: . Pupils are equal, round, and reactive to light. Conjunctivae and EOM are normal Nose: without d/c or deformity Neck: Neck supple. Gross normal ROM Cardiovascular: Normal rate and regular rhythm.   Pulmonary/Chest: Effort normal and breath sounds without rales or wheezing.  Abd:  Soft, NT, ND, + BS, no organomegaly Neurological: Pt is alert. At baseline orientation, motor grossly intact Skin: Skin is warm. No rashes, other new lesions, no LE edema Psychiatric: Pt behavior is normal without agitation  All otherwise neg per pt Lab Results  Component Value Date   WBC 5.4 12/05/2019   HGB 13.2 12/05/2019   HCT 41.7 12/05/2019   PLT 177 12/05/2019   GLUCOSE 100 (H) 11/13/2019   CHOL 118 11/13/2019   TRIG 79.0 11/13/2019   HDL 45.90 11/13/2019   LDLCALC 56 11/13/2019   ALT 17 11/13/2019   AST 18 11/13/2019   NA 141 11/13/2019   K 3.0 (L) 11/13/2019   CL 101 11/13/2019   CREATININE 1.93 (H) 11/13/2019   BUN 25 (H) 11/13/2019   CO2 33 (H) 11/13/2019   TSH 1.39 07/06/2019   PSA 0.27 07/06/2019   INR 1.2 12/05/2019   HGBA1C 5.9 11/13/2019       Assessment & Plan:

## 2020-05-12 NOTE — Assessment & Plan Note (Signed)
stable overall by history and exam, recent data reviewed with pt, and pt to continue medical treatment as before,  to f/u any worsening symptoms or concerns, for poct a1c today

## 2020-05-12 NOTE — Patient Instructions (Signed)
Your A1c was Ok today  Ravena to increase the amlodipine to 10 mg per day  Ok to increase the metoprolol to 50 mg twice per day  Please continue all other medications as before, and refills have been done if requested.  Please have the pharmacy call with any other refills you may need.  Please continue your efforts at being more active, low cholesterol diet, and weight control.  Please keep your appointments with your specialists as you may have planned  Please make an Appointment to return in 1 month, before going to [place].

## 2020-05-12 NOTE — Addendum Note (Signed)
Addended by: Marijean Heath R on: 05/12/2020 03:07 PM   Modules accepted: Orders

## 2020-05-12 NOTE — Assessment & Plan Note (Signed)
stable overall by history and exam, recent data reviewed with pt, and pt to continue medical treatment as before,  to f/u any worsening symptoms or concerns, to f/u renal

## 2020-05-14 ENCOUNTER — Ambulatory Visit (INDEPENDENT_AMBULATORY_CARE_PROVIDER_SITE_OTHER): Payer: Medicare Other | Admitting: Family Medicine

## 2020-05-14 ENCOUNTER — Encounter: Payer: Self-pay | Admitting: Family Medicine

## 2020-05-14 ENCOUNTER — Other Ambulatory Visit: Payer: Self-pay

## 2020-05-14 ENCOUNTER — Ambulatory Visit: Payer: Self-pay

## 2020-05-14 VITALS — BP 130/90 | HR 62 | Ht 73.0 in | Wt 271.0 lb

## 2020-05-14 DIAGNOSIS — G8929 Other chronic pain: Secondary | ICD-10-CM

## 2020-05-14 DIAGNOSIS — M25561 Pain in right knee: Secondary | ICD-10-CM | POA: Diagnosis not present

## 2020-05-14 DIAGNOSIS — M170 Bilateral primary osteoarthritis of knee: Secondary | ICD-10-CM

## 2020-05-14 NOTE — Assessment & Plan Note (Signed)
Right worse then left.  Severe OA Did have swelling and hemarthrosis but no new fall. Could be gout patient has had chronic problem for quite some time.  Has not had an injection for some time and we could consider the possibility of viscosupplementation.  Unable to do anti-inflammatories secondary to patient's comorbidities.  Will send in the aspiration to the lab to further evaluate.  Patient knows if any worsening symptoms to call us sooner.  Follow-up with [place] again though in 4 to 6 weeks RTC in 4 weeks

## 2020-05-14 NOTE — Patient Instructions (Addendum)
Good to see you Injection in the knee today Restart tart cherry Xray today See me again in 6 weeks

## 2020-05-14 NOTE — Progress Notes (Signed)
[name]: [phone] Subjective:   [name], am serving as a scribe for Dr. Hulan Saas. This visit occurred during the SARS-CoV-2 public health emergency.  Safety protocols were in place, including screening questions prior to the visit, additional usage of staff PPE, and extensive cleaning of exam room while observing appropriate contact time as indicated for disinfecting solutions.   I'm seeing this patient by the request  of:  Biagio Borg, MD  CC: Right arm pain R knee pain   FUX:[id number]  [name] is a 69 y.o. male coming in with complaint of Right arm swelling and R knee pain   09/19/2019 It does appear that patient swelling is likely secondary to more of a gouty flare.  Do not know if this is secondary to the vaccine versus just a coincidence.  I do not see any signs of true infectious etiology noted today but I encourage patient to continue the Keflex.  If any worsening symptoms I would expand to doxycycline for better MRSA coverage but I am hoping that this is not necessary.  Seems to be responding well to the prednisone and can.  Continue.  Avoid oral anti-inflammatory secondary to the chronic kidney injury that he has had previously.  Also has had significant infections of the kidneys previously.  Laboratory work-up otherwise was fairly unremarkable from the emergency department.  Follow-up with me again in 2 to 4 weeks  Update 05/14/2020 Patient states that Right arm is doing better and the swelling has gone down. R knee is not doing well he feels like there may be some fluid in the knee. Patient is having a hard time walking and going from sitting to standing and standing to sitting. noticeable swelling in the R knee and patient states the knee is really tight and sometimes feels like he may fall.  Patient is having increasing instability.  Has noticed significant increase in swelling.     Past Medical History:  Diagnosis Date  . Anemia    normal Fe, nl B12, nl retic, nl EPO July '13  . Aortic regurgitation    Moderate AI 04/17/19 echo  . Blood transfusion without reported diagnosis   . BPH (benign prostatic hyperplasia)   . Chronic back pain   . Chronic kidney disease    CKD III, obstructive nephropathy  . Diverticulosis   . Dysuria   . Elevated PSA, greater than or equal to 20 ng/ml June '13   PSA 107  . Hemorrhoids, internal, with bleeding, prolapse 09/19/2014  . Hyperlipidemia 05/29/2014  . Hypertension   . Obstructive uropathy 11/27/2015  . Pre-diabetes    per patient "reduced sugar intake"  . Renal insufficiency   . Tuberculosis    h/o PPD +  . UTI (urinary tract infection)   . Vitamin D deficiency 09/13/2017   Past Surgical History:  Procedure Laterality Date  . CARPAL TUNNEL RELEASE Right   . COLONOSCOPY    . HEMORRHOID BANDING    . INSERTION OF MESH N/A 10/17/2019   Procedure: Insertion Of Mesh;  Surgeon: Donald Ok, MD;  Location: [facility];  Service: General;  Laterality: N/A;  . SPLENECTOMY    . XI ROBOTIC ASSISTED SIMPLE PROSTATECTOMY N/A 03/09/2019   Procedure: XI ROBOTIC ASSISTED SIMPLE PROSTATECTOMY;  Surgeon: Donald Gustin, MD;  Location: [facility];  Service: Urology;  Laterality: N/A;  . XI ROBOTIC ASSISTED VENTRAL HERNIA N/A 10/17/2019   Procedure:  XI ROBOTIC ASSISTED INCISIONAL HERNIA REPAIR WITH MESH;  Surgeon: Donald Ok, MD;  Location: [facility];  Service: General;  Laterality: N/A;   Social History   Socioeconomic History  . Marital status: Single    Spouse name: Not on file  . Number of children: 2  . Years of education: 48  . Highest education level: Some college, no degree  Occupational History  . Occupation: Lobbyist: [employer]    Comment: unemployed  Tobacco Use  . Smoking status: Former Smoker    Types: Cigarettes    Quit date: 02/26/1980    Years since quitting: 40.2  . Smokeless tobacco:  Never Used  . Tobacco comment: Quit 1981  Vaping Use  . Vaping Use: Never used  Substance and Sexual Activity  . Alcohol use: No    Alcohol/week: 0.0 standard drinks    Comment: Quit 2004  . Drug use: No  . Sexual activity: Not Currently  Other Topics Concern  . Not on file  Social History Narrative   Native of [place], raised poor farming community. . To [place] '78 - finished College, all but Arts administrator. Married - wife in [place], blocked from [place] to [place]. 1 son '90; 1 dtr '93. 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 Resources depleted.   Right handed.   Caffeine Hot daily one daily.   Social Determinants of Health   Financial Resource Strain:   . Difficulty of Paying Living Expenses: Not on file  Food Insecurity:   . Worried About Charity fundraiser in the Last Year: Not on file  . Ran Out of Food in the Last Year: Not on file  Transportation Needs:   . Lack of Transportation (Medical): Not on file  . Lack of Transportation (Non-Medical): Not on file  Physical Activity:   . Days of Exercise per Week: Not on file  . Minutes of Exercise per Session: Not on file  Stress:   . Feeling of Stress : Not on file  Social Connections:   . Frequency of Communication with Friends and Family: Not on file  . Frequency of Social Gatherings with Friends and Family: Not on file  . Attends Religious Services: Not on file  . Active Member of Clubs or Organizations: Not on file  . Attends Archivist Meetings: Not on file  . Marital Status: Not on file   No Known Allergies Family History  Problem Relation Age of Onset  . Diabetes Mother   . Stroke Brother   . Hearing loss Brother   . Colon cancer Neg Hx   . Rectal cancer Neg Hx   . Stomach cancer Neg Hx   . Esophageal cancer Neg Hx      Current Outpatient Medications (Cardiovascular):  .  amLODipine (NORVASC) 10 MG tablet, Take 1 tablet (10 mg total) by mouth daily. Marland Kitchen  atorvastatin  (LIPITOR) 40 MG tablet, TAKE 1 TABLET BY MOUTH ONCE DAILY *KEEP  FOLLOW  UP  APPOINTMENT  IN  [facility]  FOR  REFILLS* .  metoprolol tartrate (LOPRESSOR) 50 MG tablet, Take 1 tablet (50 mg total) by mouth 2 (two) times daily.   Current Outpatient Medications (Analgesics):  .  acetaminophen (TYLENOL) 500 MG tablet, Take 1,000 mg by mouth every 6 (six) hours as needed for moderate pain.  Marland Kitchen  allopurinol (ZYLOPRIM) 100 MG tablet, Take 2 tablets by mouth once daily .  aspirin EC 81 MG tablet, Take  81 mg by mouth daily. .  colchicine 0.6 MG tablet, Take 1 tablet by mouth once daily  Current Outpatient Medications (Hematological):  .  ferrous sulfate 325 (65 FE) MG tablet, Take 325 mg by mouth daily with breakfast.  Current Outpatient Medications (Other):  .  cholecalciferol (VITAMIN D3) 25 MCG (1000 UNIT) tablet, Take 1,000 Units by mouth daily. .  hydroxypropyl methylcellulose / hypromellose (ISOPTO TEARS / GONIOVISC) 2.5 % ophthalmic solution, Place 1-2 drops into both eyes 3 (three) times daily as needed (dry/irritated eyes.). Marland Kitchen  Multiple Vitamin (MULTIVITAMIN WITH MINERALS) TABS tablet, Take 1 tablet by mouth daily. .  Potassium 99 MG TABS, Take 99 mg by mouth daily.    Reviewed prior external information including notes and imaging from  primary care provider As well as notes that were available from care everywhere and other healthcare systems.  Past medical history, social, surgical and family history all reviewed in electronic medical record.  No pertanent information unless stated regarding to the chief complaint.   Review of Systems:  No headache, visual changes, nausea, vomiting, diarrhea, constipation, dizziness, abdominal pain, skin rash, fevers, chills, night sweats, weight loss, swollen lymph nodes, body aches, joint swelling, chest pain, shortness of breath, mood changes. POSITIVE muscle aches  Objective  Blood pressure 130/90, pulse 62, height 6\' 1"  (1.854 m), weight 271 lb  (122.9 kg), SpO2 96 %.   General: No apparent distress alert and oriented x3 mood and affect normal, dressed appropriately.  HEENT: Pupils equal, extraocular movements intact  Respiratory: Patient's speak in full sentences and does not appear short of breath  Cardiovascular: 2+ lower extremity edema, non tender, no erythema  Patient's right knee does have an effusion noted.  Seems to be more over the anterior aspect as well.  Patient lacks the last 30 degrees of flexion in the last 5 degrees of extension.  Instability noted with valgus and varus force  Attempted injection of the anterior aspect but no aspiration noted.  Procedure: Real-time Ultrasound Guided Injection of right knee Device: GE Logiq Q7 Ultrasound guided injection is preferred based studies that show increased duration, increased effect, greater accuracy, decreased procedural pain, increased response rate, and decreased cost with ultrasound guided versus blind injection.  Verbal informed consent obtained.  Time-out conducted.  Noted no overlying erythema, induration, or other signs of local infection.  Skin prepped in a sterile fashion.  Local anesthesia: Topical Ethyl chloride.  With sterile technique and under real time ultrasound guidance: With a 22-gauge 2 inch needle patient was injected with 4 cc of 0.5% Marcaine and aspirated 25 cc of blood-tinged fluid then injected 1 cc of Kenalog 40 mg/dL. This was from a superior lateral approach.  Completed without difficulty  Pain immediately resolved suggesting accurate placement of the medication.  Advised to call if fevers/chills, erythema, induration, drainage, or persistent bleeding.  Images permanently stored and available for review in the ultrasound unit.  Impression: Technically successful ultrasound guided injection.   Impression and Recommendations:     The above documentation has been reviewed and is accurate and complete Lyndal Pulley, DO

## 2020-05-15 LAB — TIQ-NTM

## 2020-05-15 LAB — SYNOVIAL FLUID ANALYSIS, COMPLETE
Basophils, %: 0 %
Eosinophils-Synovial: 0 % (ref 0–2)
Lymphocytes-Synovial Fld: 83 % — ABNORMAL HIGH (ref 0–74)
Monocyte/Macrophage: 11 % (ref 0–69)
Neutrophil, Synovial: 6 % (ref 0–24)
Synoviocytes, %: 0 % (ref 0–15)
WBC, Synovial: 171 cells/uL — ABNORMAL HIGH (ref <150)

## 2020-05-16 ENCOUNTER — Other Ambulatory Visit: Payer: Self-pay | Admitting: Family Medicine

## 2020-05-16 MED ORDER — DOXYCYCLINE HYCLATE 100 MG PO TABS: 100.0000 mg | ORAL_TABLET | Freq: Every day | ORAL | 0 refills | Status: AC

## 2020-05-16 NOTE — Progress Notes (Signed)
Did not appear to be infectious  Has responded to doxycycline before though and due to the weekend and he will call back if worsening

## 2020-05-22 ENCOUNTER — Encounter (HOSPITAL_COMMUNITY): Payer: Self-pay

## 2020-05-22 ENCOUNTER — Other Ambulatory Visit: Payer: Self-pay

## 2020-05-22 ENCOUNTER — Emergency Department (HOSPITAL_COMMUNITY): Payer: Medicare Other

## 2020-05-22 ENCOUNTER — Emergency Department (HOSPITAL_COMMUNITY)
Admission: EM | Admit: 2020-05-22 | Discharge: 2020-05-22 | Disposition: A | Discharge status: Home / Self Care | Payer: Medicare Other | Attending: Emergency Medicine | Admitting: Emergency Medicine

## 2020-05-22 DIAGNOSIS — I129 Hypertensive chronic kidney disease with stage 1 through stage 4 chronic kidney disease, or unspecified chronic kidney disease: Secondary | ICD-10-CM | POA: Diagnosis not present

## 2020-05-22 DIAGNOSIS — R42 Dizziness and giddiness: Secondary | ICD-10-CM | POA: Diagnosis not present

## 2020-05-22 DIAGNOSIS — N183 Chronic kidney disease, stage 3 unspecified: Secondary | ICD-10-CM | POA: Insufficient documentation

## 2020-05-22 DIAGNOSIS — R1084 Generalized abdominal pain: Secondary | ICD-10-CM | POA: Diagnosis not present

## 2020-05-22 DIAGNOSIS — Z79899 Other long term (current) drug therapy: Secondary | ICD-10-CM | POA: Diagnosis not present

## 2020-05-22 DIAGNOSIS — K7689 Other specified diseases of liver: Secondary | ICD-10-CM | POA: Diagnosis not present

## 2020-05-22 DIAGNOSIS — K439 Ventral hernia without obstruction or gangrene: Secondary | ICD-10-CM | POA: Diagnosis not present

## 2020-05-22 DIAGNOSIS — Z7982 Long term (current) use of aspirin: Secondary | ICD-10-CM | POA: Insufficient documentation

## 2020-05-22 DIAGNOSIS — R6889 Other general symptoms and signs: Secondary | ICD-10-CM | POA: Diagnosis not present

## 2020-05-22 DIAGNOSIS — K5730 Diverticulosis of large intestine without perforation or abscess without bleeding: Secondary | ICD-10-CM | POA: Diagnosis not present

## 2020-05-22 DIAGNOSIS — Z87891 Personal history of nicotine dependence: Secondary | ICD-10-CM | POA: Diagnosis not present

## 2020-05-22 DIAGNOSIS — K429 Umbilical hernia without obstruction or gangrene: Secondary | ICD-10-CM | POA: Diagnosis not present

## 2020-05-22 DIAGNOSIS — Z743 Need for continuous supervision: Secondary | ICD-10-CM | POA: Diagnosis not present

## 2020-05-22 DIAGNOSIS — R5381 Other malaise: Secondary | ICD-10-CM | POA: Diagnosis not present

## 2020-05-22 LAB — COMPREHENSIVE METABOLIC PANEL
ALT: 18 U/L (ref 0–44)
AST: 24 U/L (ref 15–41)
Albumin: 4.1 g/dL (ref 3.5–5.0)
Alkaline Phosphatase: 98 U/L (ref 38–126)
Anion gap: 9 (ref 5–15)
BUN: 33 mg/dL — ABNORMAL HIGH (ref 8–23)
CO2: 31 mmol/L (ref 22–32)
Calcium: 9 mg/dL (ref 8.9–10.3)
Chloride: 101 mmol/L (ref 98–111)
Creatinine, Ser: 2.1 mg/dL — ABNORMAL HIGH (ref 0.61–1.24)
GFR, Estimated: 33 mL/min — ABNORMAL LOW (ref >60)
Glucose, Bld: 83 mg/dL (ref 70–99)
Potassium: 3.3 mmol/L — ABNORMAL LOW (ref 3.5–5.1)
Sodium: 141 mmol/L (ref 135–145)
Total Bilirubin: 1 mg/dL (ref 0.3–1.2)
Total Protein: 7.3 g/dL (ref 6.5–8.1)

## 2020-05-22 LAB — CBC WITH DIFFERENTIAL/PLATELET
Abs Immature Granulocytes: 0.01 10*3/uL (ref 0.00–0.07)
Basophils Absolute: 0.1 10*3/uL (ref 0.0–0.1)
Basophils Relative: 1 %
Eosinophils Absolute: 0.3 10*3/uL (ref 0.0–0.5)
Eosinophils Relative: 4 %
HCT: 45.2 % (ref 39.0–52.0)
Hemoglobin: 14.7 g/dL (ref 13.0–17.0)
Immature Granulocytes: 0 %
Lymphocytes Relative: 42 %
Lymphs Abs: 2.8 10*3/uL (ref 0.7–4.0)
MCH: 29.1 pg (ref 26.0–34.0)
MCHC: 32.5 g/dL (ref 30.0–36.0)
MCV: 89.5 fL (ref 80.0–100.0)
Monocytes Absolute: 0.6 10*3/uL (ref 0.1–1.0)
Monocytes Relative: 9 %
Neutro Abs: 2.8 10*3/uL (ref 1.7–7.7)
Neutrophils Relative %: 44 %
Platelets: 282 10*3/uL (ref 150–400)
RBC: 5.05 MIL/uL (ref 4.22–5.81)
RDW: 13.7 % (ref 11.5–15.5)
WBC: 6.6 10*3/uL (ref 4.0–10.5)
nRBC: 0 % (ref 0.0–0.2)

## 2020-05-22 LAB — LIPASE, BLOOD: Lipase: 61 U/L — ABNORMAL HIGH (ref 11–51)

## 2020-05-22 MED ORDER — DIAZEPAM 2 MG PO TABS
2.0000 mg | ORAL_TABLET | Freq: Once | ORAL | Status: AC
  Administered 2020-05-22: 08:00:00 2 mg via ORAL
  Filled 2020-05-22: qty 1

## 2020-05-22 MED ORDER — MECLIZINE HCL 25 MG PO TABS: 25.0000 mg | ORAL_TABLET | Freq: Two times a day (BID) | ORAL | 0 refills | Status: DC | PRN

## 2020-05-22 MED ORDER — SODIUM CHLORIDE 0.9 % IV BOLUS
500.0000 mL | Freq: Once | INTRAVENOUS | Status: AC
  Administered 2020-05-22: 08:00:00 500 mL via INTRAVENOUS

## 2020-05-22 NOTE — ED Triage Notes (Signed)
Pt bib ems from home following 2 hours of vertigo. Per ems, pt has hx of same, described as feeling "the whole room is spinning." Also endorses abd pain d/t chronic constipation. Denies N/V.  EMS Vitals: 178/96 HR 72 RR 16 96% on RA 97.40f

## 2020-05-22 NOTE — ED Provider Notes (Signed)
[facility] DEPT Provider Note   CSN: [id number] Arrival date & time: 05/22/20  6213     History Chief Complaint  Patient presents with  . Dizziness  . Abdominal Pain  . Constipation    [name] is a 69 y.o. male.  Patient with history of CKD who presents the ED with dizziness and also abdominal pain.  Dizziness over the last 2 hours.  Sudden onset and worse with movement.  No severe nausea or vomiting.  Feels better at rest and gets mildly worse when he is walking.  Denies any weakness or numbness.  History of the same possibly vertigo.  Denies any hearing loss or vision changes.  Patient also here with abdominal pain it has been going on for several months.  Possibly worse over the last several days.  History of abdominal surgeries.  Has had some constipation in the past.  Denies any black stools or bloody stools.  The history is provided by the patient.  Illness Severity:  Mild Onset quality:  Sudden Timing:  Intermittent Progression:  Waxing and waning Chronicity:  New Associated symptoms: abdominal pain   Associated symptoms: no chest pain, no cough, no diarrhea, no ear pain, no fever, no headaches, no nausea, no rash, no shortness of breath, no sore throat and no vomiting        Past Medical History:  Diagnosis Date  . Anemia    normal Fe, nl B12, nl retic, nl EPO July '13  . Aortic regurgitation    Moderate AI 04/17/19 echo  . Blood transfusion without reported diagnosis   . BPH (benign prostatic hyperplasia)   . Chronic back pain   . Chronic kidney disease    CKD III, obstructive nephropathy  . Diverticulosis   . Dysuria   . Elevated PSA, greater than or equal to 20 ng/ml June '13   PSA 107  . Hemorrhoids, internal, with bleeding, prolapse 09/19/2014  . Hyperlipidemia 05/29/2014  . Hypertension   . Obstructive uropathy 11/27/2015  . Pre-diabetes    per patient "reduced sugar intake"  . Renal insufficiency   . Tuberculosis     h/o PPD +  . UTI (urinary tract infection)   . Vitamin D deficiency 09/13/2017    Patient Active Problem List   Diagnosis Date Noted  . Acute renal failure superimposed on chronic kidney disease ([place]) 11/12/2019  . Dry eyes 11/12/2019  . S/P hernia repair 10/17/2019  . Abdominal pain 07/06/2019  . BPH with obstruction/lower urinary tract symptoms 03/09/2019  . Clot retention of urine 02/11/2019  . Vitamin D deficiency 09/13/2017  . Urinary tract infection without hematuria 07/23/2017  . Low back pain 07/03/2017  . Dizziness 06/30/2017  . Acute gouty arthritis 12/01/2016  . Degenerative arthritis of knee, bilateral 11/18/2016  . Urinary retention due to benign prostatic hyperplasia 10/24/2016  . Mass of right side of neck 10/20/2016  . Gout 10/20/2016  . Right knee pain 10/12/2016  . Hypokalemia 05/28/2016  . Encounter for well adult exam with abnormal findings 11/27/2015  . Obstructive uropathy 11/27/2015  . Elevated PSA 11/27/2015  . Acute pyelonephritis 05/27/2015  . Generalized bloating 05/27/2015  . Constipation 05/27/2015  . Chest pain 05/27/2015  . AKI (acute kidney injury) ([place]) 05/27/2015  . Acute sinus infection 11/22/2014  . Cough 09/25/2014  . Hemorrhoids, internal, with bleeding, prolapse 09/19/2014  . 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 Hyperlipidemia 05/29/2014  . Lumbar radiculopathy 05/23/2014  . Lumbar stenosis 11/20/2013  . Abnormal  glucose 11/06/2013  . Unspecified hereditary and idiopathic peripheral neuropathy 01/22/2013  . Cardiomyopathy due to hypertension ([place]) 12/28/2011  . CKD (chronic kidney disease) stage 3, GFR 30-59 ml/min ([facility]) 11/24/2011  . Hematuria 11/24/2011  . Hydronephrosis 11/24/2011  . Anemia 11/24/2011  . BRADYCARDIA 02/05/2010  . TINEA PEDIS 01/29/2010  . 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 PERIPHERAL EDEMA 01/29/2010  . ABNORMAL ELECTROCARDIOGRAM 01/29/2010  . 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  . Osteoarthrosis, generalized,  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  . ONYCHOMYCOSIS, TOENAILS 10/02/2007  . BPH (benign prostatic hyperplasia) 10/02/2007  . Essential hypertension 03/06/2007    Past Surgical History:  Procedure Laterality Date  . CARPAL TUNNEL RELEASE Right   . COLONOSCOPY    . HEMORRHOID BANDING    . INSERTION OF MESH N/A 10/17/2019   Procedure: Insertion Of Mesh;  Surgeon: Ralene Ok, MD;  Location: [facility];  Service: General;  Laterality: N/A;  . SPLENECTOMY    . XI ROBOTIC ASSISTED SIMPLE PROSTATECTOMY N/A 03/09/2019   Procedure: XI ROBOTIC ASSISTED SIMPLE PROSTATECTOMY;  Surgeon: Cleon Gustin, MD;  Location: [facility];  Service: Urology;  Laterality: N/A;  . XI ROBOTIC ASSISTED VENTRAL HERNIA N/A 10/17/2019   Procedure: XI ROBOTIC ASSISTED INCISIONAL HERNIA REPAIR WITH MESH;  Surgeon: Ralene Ok, MD;  Location: [facility] OR;  Service: General;  Laterality: N/A;       Family History  Problem Relation Age of Onset  . Diabetes Mother   . Stroke Brother   . Hearing loss Brother   . Colon cancer Neg Hx   . Rectal cancer Neg Hx   . Stomach cancer Neg Hx   . Esophageal cancer Neg Hx     Social History   Tobacco Use  . Smoking status: Former Smoker    Types: Cigarettes    Quit date: 02/26/1980    Years since quitting: 40.2  . Smokeless tobacco: Never Used  . Tobacco comment: Quit 1981  Vaping Use  . Vaping Use: Never used  Substance Use Topics  . Alcohol use: No    Alcohol/week: 0.0 standard drinks    Comment: Quit 2004  . Drug use: No    Home Medications Prior to Admission medications   Medication Sig Start Date End Date Taking? Authorizing Provider  acetaminophen (TYLENOL) 500 MG tablet Take 1,000 mg by mouth every 6 (six) hours as needed for moderate pain.     [provider]  allopurinol (ZYLOPRIM) 100 MG tablet Take 2 tablets by mouth once daily 05/05/20   Biagio Borg, MD  amLODipine (NORVASC) 10 MG tablet Take 1 tablet (10 mg total) by mouth daily. 05/12/20   Biagio Borg, MD  aspirin EC 81 MG tablet Take 81 mg by mouth daily.    [provider]  atorvastatin (LIPITOR) 40 MG tablet TAKE 1 TABLET BY MOUTH ONCE DAILY *KEEP  FOLLOW  UP  APPOINTMENT  IN  [facility]  FOR  REFILLS* 05/05/20   Biagio Borg, MD  cholecalciferol (VITAMIN D3) 25 MCG (1000 UNIT) tablet Take 1,000 Units by mouth daily.    [provider]  colchicine 0.6 MG tablet Take 1 tablet by mouth once daily 03/31/20   Biagio Borg, MD  doxycycline (VIBRA-TABS) 100 MG tablet Take 1 tablet (100 mg total) by mouth daily for 7 days. 05/16/20 05/23/20  Lyndal Pulley, DO  ferrous sulfate 325 (65 FE) MG tablet Take 325 mg by mouth daily with breakfast.    [provider]  hydroxypropyl  methylcellulose / hypromellose (ISOPTO TEARS / GONIOVISC) 2.5 % ophthalmic solution Place 1-2 drops into both eyes 3 (three) times daily as needed (dry/irritated eyes.). 11/12/19   Biagio Borg, MD  meclizine (ANTIVERT) 25 MG tablet Take 1 tablet (25 mg total) by mouth 2 (two) times daily as needed for up to 20 doses for dizziness. 05/22/20   Earle Troiano, DO  metoprolol tartrate (LOPRESSOR) 50 MG tablet Take 1 tablet (50 mg total) by mouth 2 (two) times daily. 05/12/20   Biagio Borg, MD  Multiple Vitamin (MULTIVITAMIN WITH MINERALS) TABS tablet Take 1 tablet by mouth daily.    [provider]  Potassium 99 MG TABS Take 99 mg by mouth daily.     [provider]  Hyoscyamine Sulfate SL (LEVSIN/SL) 0.125 MG SUBL Place 0.125 mg under the tongue every 6 (six) hours as needed (bladder spasm). Patient not taking: Reported on 07/06/2019 02/11/19 09/18/19  Irine Seal, MD    Allergies    Patient has no known allergies.  Review of Systems   Review of Systems  Constitutional: Negative for chills and fever.  HENT: Negative for ear pain and sore throat.   Eyes: Negative for pain and visual disturbance.  Respiratory: Negative for cough and shortness of breath.   Cardiovascular: Negative  for chest pain and palpitations.  Gastrointestinal: Positive for abdominal distention, abdominal pain and constipation. Negative for anal bleeding, blood in stool, diarrhea, nausea, rectal pain and vomiting.  Genitourinary: Negative for dysuria and hematuria.  Musculoskeletal: Negative for arthralgias and back pain.  Skin: Negative for color change and rash.  Neurological: Positive for dizziness. Negative for tremors, seizures, syncope, facial asymmetry, speech difficulty, weakness, light-headedness, numbness and headaches.  All other systems reviewed and are negative.   Physical Exam Updated Vital Signs BP (!) 144/97 (BP Location: Right Arm)   Pulse (!) 58   Temp 97.7 F (36.5 C) (Oral)   Resp 15   SpO2 98%   Physical Exam Vitals and nursing note reviewed.  Constitutional:      General: He is not in acute distress.    Appearance: He is well-developed and well-nourished. He is not ill-appearing.  HENT:     Head: Normocephalic and atraumatic.     Mouth/Throat:     Mouth: Mucous membranes are moist.  Eyes:     Extraocular Movements: Extraocular movements intact.     Conjunctiva/sclera: Conjunctivae normal.     Pupils: Pupils are equal, round, and reactive to light.  Cardiovascular:     Rate and Rhythm: Normal rate and regular rhythm.     Heart sounds: Normal heart sounds. No murmur heard.   Pulmonary:     Effort: Pulmonary effort is normal. No respiratory distress.     Breath sounds: Normal breath sounds.  Abdominal:     General: There is distension.     Palpations: Abdomen is soft.     Tenderness: There is generalized abdominal tenderness. There is no right CVA tenderness, left CVA tenderness, guarding or rebound.  Musculoskeletal:        General: No edema.     Cervical back: Neck supple.  Skin:    General: Skin is warm and dry.     Capillary Refill: Capillary refill takes less than 2 seconds.  Neurological:     General: No focal deficit present.     Mental Status:  He is alert and oriented to person, place, and time.     Cranial Nerves: No cranial nerve deficit.  Motor: No weakness.     Comments: 5+ out of 5 strength throughout, normal sensation, no visual field deficit, normal speech, fairly normal gait without much issues without any support  Psychiatric:        Mood and Affect: Mood and affect and mood normal.     ED Results / Procedures / Treatments   Labs (all labs ordered are listed, but only abnormal results are displayed) Labs Reviewed  COMPREHENSIVE METABOLIC PANEL - Abnormal; Notable for the following components:      Result Value   Potassium 3.3 (*)    BUN 33 (*)    Creatinine, Ser 2.10 (*)    GFR, Estimated 33 (*)    All other components within normal limits  LIPASE, BLOOD - Abnormal; Notable for the following components:   Lipase 61 (*)    All other components within normal limits  CBC WITH DIFFERENTIAL/PLATELET    EKG EKG Interpretation  Date/Time:  Thursday May 22 2020 08:05:00 EST Ventricular Rate:  61 PR Interval:    QRS Duration: 177 QT Interval:  495 QTC Calculation: 499 R Axis:   -52 Text Interpretation: Sinus rhythm Prolonged PR interval RBBB and LAFB No significant change since last tracing Confirmed by Lennice Sites [phone]) on 05/22/2020 8:09:37 AM   Radiology CT ABDOMEN PELVIS WO CONTRAST  Result Date: 05/22/2020 CLINICAL DATA:  Abdominal distention. Abdominal pain. Chronic constipation. EXAM: CT ABDOMEN AND PELVIS WITHOUT CONTRAST TECHNIQUE: Multidetector CT imaging of the abdomen and pelvis was performed following the standard protocol without IV contrast. COMPARISON:  10/21/2019 FINDINGS: Lower chest: Heart size upper normal. Areas of chronic atelectasis or scarring noted in the lung bases bilaterally. Hepatobiliary: Similar appearance of the hypoattenuating lesions in the medial hepatic dome. There is no evidence for gallstones, gallbladder wall thickening, or pericholecystic fluid. No intrahepatic  or extrahepatic biliary dilation. Pancreas: No focal mass lesion. No dilatation of the main duct. No intraparenchymal cyst. No peripancreatic edema. Spleen: Splenectomy Adrenals/Urinary Tract: No adrenal nodule or mass. Right kidney is markedly atrophic. Lobular contour left kidney with atrophy in the lower pole. Low-density lesion measured previously in the lower pole at 2.2 cm is stable (49/3). No hydroureter. The urinary bladder appears normal for the degree of distention. Stomach/Bowel: Stomach is unremarkable. No gastric wall thickening. No evidence of outlet obstruction. Duodenum is normally positioned as is the ligament of Treitz. No small bowel wall thickening. No small bowel dilatation. The terminal ileum is normal. The appendix is normal. No gross colonic mass. No colonic wall thickening. Diverticuli are seen scattered along the entire length of the colon without CT findings of diverticulitis. No substantial stool volume. Vascular/Lymphatic: There is abdominal aortic atherosclerosis without aneurysm. There is no gastrohepatic or hepatoduodenal ligament lymphadenopathy. No retroperitoneal or mesenteric lymphadenopathy. No pelvic sidewall lymphadenopathy. Right paramidline epigastric ventral hernia contains only fat, measuring hernia sac of 2.3 x 3.0 cm. Complex supraumbilical ventral hernia seen previously has undergone interval repair. The extensive fluid associated with the anterior abdominal wall on the prior study has essentially resolved. Tiny fat containing umbilical hernia is stable. Reproductive: Prostate gland appears mildly enlarged. Central low-density in the prostate gland suggests prior TURP. Other: No intraperitoneal free fluid. Musculoskeletal: No worrisome lytic or sclerotic osseous abnormality. IMPRESSION: 1. No acute findings in the abdomen or pelvis. Specifically, no findings to explain the patient's history of abdominal pain and constipation. 2. Interval resolution of fluid associated  with the ventral hernia repair seen previously. 3. Stable appearance of the hypoattenuating lesions  in the medial hepatic dome. Indeterminate. 4. Stable appearance of the 2.2 cm low-density lesion lower pole left kidney. This may be a cyst or dilated calyx. Continued attention on follow-up recommended. 5. Colonic diverticulosis without diverticulitis. No substantial stool volume on today's study. 6. Aortic Atherosclerosis (ICD10-I70.0). Electronically Signed   By: Misty Stanley M.D.   On: 05/22/2020 10:24   CT Head Wo Contrast  Result Date: 05/22/2020 CLINICAL DATA:  69 year old male with dizziness, vertigo. EXAM: CT HEAD WITHOUT CONTRAST TECHNIQUE: Contiguous axial images were obtained from the base of the skull through the vertex without intravenous contrast. COMPARISON:  Head CT 04/01/2019 and earlier. FINDINGS: Brain: Cerebral volume is stable and normal. No midline shift, ventriculomegaly, mass effect, evidence of mass lesion, intracranial hemorrhage or evidence of cortically based acute infarction. Patchy and confluent bilateral cerebral white matter hypodensity appears stable since last year. Vascular: Calcified atherosclerosis at the skull base. Generalized intracranial artery dolichoectasia. The basilar artery chronically measures 8-9 mm diameter. The distal ICAs are chronic up to 10 mm diameter. No suspicious intracranial vascular hyperdensity. Skull: No acute osseous abnormality identified. Congenital incomplete ossification of the posterior C1 ring. Sinuses/Orbits: Visualized paranasal sinuses and mastoids are stable and well pneumatized. Bilateral tympanic cavities appear stable and clear. Other: Stable orbit and scalp soft tissues. IMPRESSION: 1. No acute intracranial abnormality. Stable chronic white matter disease. 2. Chronically Dolichoectatic intracranial arteries. Query chronic Hypertension, Connective Tissue Disease. Electronically Signed   By: Genevie Ann M.D.   On: 05/22/2020 10:19     Procedures Procedures (including critical care time)  Medications Ordered in ED Medications  diazepam (VALIUM) tablet 2 mg (2 mg Oral Given 05/22/20 9528)  sodium chloride 0.9 % bolus 500 mL (0 mLs Intravenous Stopped 05/22/20 0855)    ED Course  I have reviewed the triage vital signs and the nursing notes.  Pertinent labs & imaging results that were available during my care of the patient were reviewed by me and considered in my medical decision making (see chart for details).    MDM Rules/Calculators/A&P                          [name] is a 69 year old male with history of high cholesterol, CKD, BPH who presents to the ED with dizziness and abdominal pain.  Abdominal pain fairly chronic.  Worse over the last few days.  Has had constipation.  However has had multiple abdominal surgeries.  Appears distended.  Will rule out obstruction/UTI/constipation and will get lab work and CT scan of the abdomen and pelvis.  He also woke up with acute dizziness and on balance issues.  No strength problems or neurologic problems otherwise.  He is neurologically intact and is able to walk fairly easily without major issues.  Suspect some type of peripheral vertigo.  Symptoms are not concerning for stroke overall.  No visual changes.  Will get a CT scan of his head.  EKG shows sinus rhythm with bundle branch block.  Unchanged from prior.  No chest pain and doubt ACS.  Denies any melena or hematochezia but will evaluate for anemia as well as dehydration and electrolyte abnormalities.  Will treat with IV fluids and Valium for presumed vertigo.  Lab work unremarkable.  No significant anemia, electrolyte abnormality, kidney injury.  Creatinine at baseline.  CT scan with no acute findings.  CT scan abdomen and pelvis overall with no acute findings.  No pancreatitis.  No bowel obstruction.  Overall  patient feeling better after IV fluids and Valium.  Almost asymptomatic.  Suspect peripheral vertigo.   Recommend continued use of meclizine and close follow-up with primary care doctor.  Discharged in good condition.  This chart was dictated using voice recognition software.  Despite best efforts to proofread,  errors can occur which can change the documentation meaning.    Final Clinical Impression(s) / ED Diagnoses Final diagnoses:  Dizziness    Rx / DC Orders ED Discharge Orders         Ordered    meclizine (ANTIVERT) 25 MG tablet  2 times daily PRN        05/22/20 1037           Raman Featherston, DO 05/22/20 1038

## 2020-05-29 ENCOUNTER — Other Ambulatory Visit: Payer: Self-pay | Admitting: Internal Medicine

## 2020-05-29 NOTE — Telephone Encounter (Signed)
Please refill as per office routine med refill policy (all routine meds refilled for 3 mo or monthly per pt preference up to one year from last visit, then month to month grace period for 3 mo, then further med refills will have to be denied)  

## 2020-06-10 ENCOUNTER — Encounter: Payer: Self-pay | Admitting: Gastroenterology

## 2020-06-10 ENCOUNTER — Ambulatory Visit (INDEPENDENT_AMBULATORY_CARE_PROVIDER_SITE_OTHER): Payer: Medicare Other | Admitting: Gastroenterology

## 2020-06-10 VITALS — BP 126/72 | HR 85 | Ht 73.0 in | Wt 269.0 lb

## 2020-06-10 DIAGNOSIS — K5909 Other constipation: Secondary | ICD-10-CM

## 2020-06-10 NOTE — Patient Instructions (Addendum)
If you are age 70 or older, your body mass index should be between 23-30. Your Body mass index is 35.49 kg/m. If this is out of the aforementioned range listed, please consider follow up with your Primary Care Provider.  If you are age 68 or younger, your body mass index should be between 19-25. Your Body mass index is 35.49 kg/m. If this is out of the aformentioned range listed, please consider follow up with your Primary Care Provider.   Please Stop Miralax.  Start Linzess 145 mcg daily. Call back in 10 days and let us know with an update ask for [employer].   Thank you for choosing me and [facility].  Doug Sou, PA-C

## 2020-06-10 NOTE — Progress Notes (Signed)
06/10/2020 [name] [id number] 02-07-1951   HISTORY OF PRESENT ILLNESS: This is a pleasant 70 year old male is a patient of Dr. Marvell Fuller. He was only seen by him on several occasions back in 2016.  Underwent colonoscopy March 2016 with Dr. Leone Payor showed redundant colon, diverticulosis throughout the colon and internal hemorrhoids. Subsequently he underwent internal hemorrhoid banding with the last on 10/17/2014. He presents here today with complaints of constipation and what sounds like rectal prolapse with intermittent bright red blood on the toilet paper. He says that if he strains too much then his rectum or part of his intestines seem to come out and he has to push it back up in. Has been using MiraLAX daily, but still struggles with constipation despite that.  Recent CBC normal. He actually a CT scan of the abdomen and pelvis without contrast performed on December 16. That showed colonic diverticulosis without diverticulitis and no substantial stool volume on that particular study. There were no findings to explain history of abdominal pain or constipation.    Past Medical History:  Diagnosis Date  . Anemia    normal Fe, nl B12, nl retic, nl EPO July '13  . Aortic regurgitation    Moderate AI 04/17/19 echo  . Blood transfusion without reported diagnosis   . BPH (benign prostatic hyperplasia)   . Chronic back pain   . Chronic kidney disease    CKD III, obstructive nephropathy  . Diverticulosis   . Dysuria   . Elevated PSA, greater than or equal to 20 ng/ml June '13   PSA 107  . Hemorrhoids, internal, with bleeding, prolapse 09/19/2014  . Hyperlipidemia 05/29/2014  . Hypertension   . Obstructive uropathy 11/27/2015  . Pre-diabetes    per patient "reduced sugar intake"  . Renal insufficiency   . Tuberculosis    h/o PPD +  . UTI (urinary tract infection)   . Vitamin D deficiency 09/13/2017   Past Surgical History:  Procedure Laterality Date  . CARPAL TUNNEL RELEASE Right    . COLONOSCOPY    . HEMORRHOID BANDING    . INSERTION OF MESH N/A 10/17/2019   Procedure: Insertion Of Mesh;  Surgeon: Axel Filler, MD;  Location: [facility] OR;  Service: General;  Laterality: N/A;  . SPLENECTOMY    . XI ROBOTIC ASSISTED SIMPLE PROSTATECTOMY N/A 03/09/2019   Procedure: XI ROBOTIC ASSISTED SIMPLE PROSTATECTOMY;  Surgeon: Malen Gauze, MD;  Location: [facility] ORS;  Service: Urology;  Laterality: N/A;  . XI ROBOTIC ASSISTED VENTRAL HERNIA N/A 10/17/2019   Procedure: XI ROBOTIC ASSISTED INCISIONAL HERNIA REPAIR WITH MESH;  Surgeon: Axel Filler, MD;  Location: [facility] OR;  Service: General;  Laterality: N/A;    reports that he quit smoking about 40 years ago. His smoking use included cigarettes. He has never used smokeless tobacco. He reports that he does not drink alcohol and does not use drugs. family history includes Diabetes in his mother; Hearing loss in his brother; Stroke in his brother. No Known Allergies    Outpatient Encounter Medications as of 06/10/2020  Medication Sig  . acetaminophen (TYLENOL) 500 MG tablet Take 1,000 mg by mouth every 6 (six) hours as needed for moderate pain.   Marland Kitchen allopurinol (ZYLOPRIM) 100 MG tablet Take 2 tablets by mouth once daily (Patient taking differently: Take 200 mg by mouth daily.)  . amLODipine (NORVASC) 10 MG tablet Take 1 tablet (10 mg total) by mouth daily.  Marland Kitchen aspirin EC 81 MG tablet Take 81 mg by mouth  daily.  . atorvastatin (LIPITOR) 40 MG tablet TAKE 1 TABLET BY MOUTH ONCE DAILY *KEEP  FOLLOW  UP  APPOINTMENT  IN  [facility]  FOR  REFILLS* (Patient taking differently: Take 40 mg by mouth daily.)  . cholecalciferol (VITAMIN D3) 25 MCG (1000 UNIT) tablet Take 1,000 Units by mouth daily.  . colchicine 0.6 MG tablet Take 1 tablet by mouth once daily  . ferrous sulfate 325 (65 FE) MG tablet Take 325 mg by mouth daily with breakfast.  . hydroxypropyl methylcellulose / hypromellose (ISOPTO TEARS / GONIOVISC) 2.5 % ophthalmic solution Place 1-2  drops into both eyes 3 (three) times daily as needed (dry/irritated eyes.).  Marland Kitchen meclizine (ANTIVERT) 25 MG tablet Take 1 tablet (25 mg total) by mouth 2 (two) times daily as needed for up to 20 doses for dizziness.  . metoprolol tartrate (LOPRESSOR) 50 MG tablet Take 1 tablet (50 mg total) by mouth 2 (two) times daily.  . Multiple Vitamin (MULTIVITAMIN WITH MINERALS) TABS tablet Take 1 tablet by mouth daily.  . Potassium 99 MG TABS Take 99 mg by mouth daily.   . [DISCONTINUED] Hyoscyamine Sulfate SL (LEVSIN/SL) 0.125 MG SUBL Place 0.125 mg under the tongue every 6 (six) hours as needed (bladder spasm). (Patient not taking: Reported on 07/06/2019)   No facility-administered encounter medications on file as of 06/10/2020.     REVIEW OF SYSTEMS  : All other systems reviewed and negative except where noted in the History of Present Illness.   PHYSICAL EXAM: BP 126/72   Pulse 85   Ht 6\' 1"  (1.854 m)   Wt 269 lb (122 kg)   BMI 35.49 kg/m  General: Well developed AA male in no acute distress Head: Normocephalic and atraumatic Eyes:  Sclerae anicteric, conjunctiva pink. Ears: Normal auditory acuity Lungs: Clear throughout to auscultation; no W/R/R. Heart: Regular rate and rhythm; no M/R/G. Abdomen: Soft, non-distended.  BS present.  Non-tender.  Ventral hernia noted. Rectal:  A couple of small non-inflamed external hemorrhoids were noted.  No prolapse with bearing down.  DRE did not reveal any masses.  Good squeeze. Musculoskeletal: Symmetrical with no gross deformities  Skin: No lesions on visible extremities Extremities: No edema  Neurological: Alert oriented x 4, grossly non-focal Psychological:  Alert and cooperative. Normal mood and affect  ASSESSMENT AND PLAN: *Chronic constipation: Has been taking MiraLAX daily without much relief. He complains of some rectal prolapse when straining a lot. We will try Linzess 145 mcg daily. Samples given. I would like him to call back in about 10 days  with an update so that we can determine if he will continue at that dose or if we need to adjust the dose depending on his results. Prescription can be sent in at that time. Obviously his rectal prolapse will be prevented from occurring if he is not straining such to have a bowel movement   CC:  Donald Borg, MD

## 2020-06-11 ENCOUNTER — Encounter: Payer: Self-pay | Admitting: Family Medicine

## 2020-06-11 ENCOUNTER — Ambulatory Visit (INDEPENDENT_AMBULATORY_CARE_PROVIDER_SITE_OTHER): Payer: Medicare Other | Admitting: Family Medicine

## 2020-06-11 ENCOUNTER — Ambulatory Visit (INDEPENDENT_AMBULATORY_CARE_PROVIDER_SITE_OTHER): Payer: Medicare Other

## 2020-06-11 ENCOUNTER — Other Ambulatory Visit: Payer: Self-pay

## 2020-06-11 VITALS — BP 128/82 | HR 71 | Ht 73.0 in | Wt 268.0 lb

## 2020-06-11 DIAGNOSIS — G8929 Other chronic pain: Secondary | ICD-10-CM

## 2020-06-11 DIAGNOSIS — M25561 Pain in right knee: Secondary | ICD-10-CM

## 2020-06-11 DIAGNOSIS — M170 Bilateral primary osteoarthritis of knee: Secondary | ICD-10-CM | POA: Diagnosis not present

## 2020-06-11 NOTE — Assessment & Plan Note (Signed)
Patient severe arthritic changes.  Patient did have a very mild hemarthrosis and potentially a very small infection that has responded very well to the doxycycline.  Gouty arthritis is within the differential as well.  Do not want to do too many steroids in quick succession some patient will follow up again in 4 weeks and at that point we can do injections.  Discussed warm compression.  Patient does have some dry skin on the lateral aspect of the knee.  New x-rays ordered today as well to further evaluate for any type of new fracture that could be contributing.  If this continues to give him difficulty he may want to consider the possibility of surgical intervention but patient has declined that today.  He knows if worsening symptoms to call us or to seek medical attention immediately.

## 2020-06-11 NOTE — Patient Instructions (Signed)
Aveno or Eucerin to the knee Warm compresses to outside of knee Xray today Rknee If things worsen call me  See me in 4 weeks for another injection

## 2020-06-11 NOTE — Progress Notes (Signed)
[facility] [address] [place] [address] Phone: [phone] Subjective:   Bruce Donath, am serving as a scribe for Dr. Antoine Primas. This visit occurred during the SARS-CoV-2 public health emergency.  Safety protocols were in place, including screening questions prior to the visit, additional usage of staff PPE, and extensive cleaning of exam room while observing appropriate contact time as indicated for disinfecting solutions.   I'm seeing this patient by the request  of:  Corwin Levins, MD  CC: Right knee pain  [id number]   05/14/2020 Right worse then left.  Severe OA Did have swelling and hemarthrosis but no new fall. Could be gout patient has had chronic problem for quite some time.  Has not had an injection for some time and we could consider the possibility of viscosupplementation.  Unable to do anti-inflammatories secondary to patient's comorbidities.  Will send in the aspiration to the lab to further evaluate.  Patient knows if any worsening symptoms to call us sooner.  Follow-up with [place] again though in 4 to 6 weeks RTC in 4 weeks  Update 06/11/2020 Stan Cantave is a 70 y.o. male coming in with complaint of bilateral knee pain. Patient states that his pain is constant.  States that it is better than what it was prior to the injections though.  Has noticed the swelling has been improved but still has pain more on the lateral aspect.  Has been taking Tylenol for pain.  Avoiding anti-inflammatories.  Patient would state somewhere between 30 and 40% better.     Past Medical History:  Diagnosis Date  . Anemia    normal Fe, nl B12, nl retic, nl EPO July '13  . Aortic regurgitation    Moderate AI 04/17/19 echo  . Blood transfusion without reported diagnosis   . BPH (benign prostatic hyperplasia)   . Chronic back pain   . Chronic kidney disease    CKD III, obstructive nephropathy  . Diverticulosis   . Dysuria   . Elevated PSA, greater  than or equal to 20 ng/ml June '13   PSA 107  . Hemorrhoids, internal, with bleeding, prolapse 09/19/2014  . Hyperlipidemia 05/29/2014  . Hypertension   . Obstructive uropathy 11/27/2015  . Pre-diabetes    per patient "reduced sugar intake"  . Renal insufficiency   . Tuberculosis    h/o PPD +  . UTI (urinary tract infection)   . Vitamin D deficiency 09/13/2017   Past Surgical History:  Procedure Laterality Date  . CARPAL TUNNEL RELEASE Right   . COLONOSCOPY    . HEMORRHOID BANDING    . INSERTION OF MESH N/A 10/17/2019   Procedure: Insertion Of Mesh;  Surgeon: Axel Filler, MD;  Location[facility] OR;  Service: General;  Laterality: N/A;  . SPLENECTOMY    . XI ROBOTIC ASSISTED SIMPLE PROSTATECTOMY N/A 03/09/2019   Procedure: XI ROBOTIC ASSISTED SIMPLE PROSTATECTOMY;  Surgeon: Malen Gauze, MD;  Location: [facility] ORS;  Service: Urology;  Laterality: N/A;  . XI ROBOTIC ASSISTED VENTRAL HERNIA N/A 10/17/2019   Procedure: XI ROBOTIC ASSISTED INCISIONAL HERNIA REPAIR WITH MESH;  Surgeon: Axel Filler, MD;  Location: [facility] OR;  Service: General;  Laterality: N/A;   Social History   Socioeconomic History  . Marital status: Single    Spouse name: Not on file  . Number of children: 2  . Years of education: 20  . Highest education level: Some college, no degree  Occupational History  . Occupation: Publishing copy  Employer: [employer]    Comment: unemployed  Tobacco Use  . Smoking status: Former Smoker    Types: Cigarettes    Quit date: 02/26/1980    Years since quitting: 40.3  . Smokeless tobacco: Never Used  . Tobacco comment: Quit 1981  Vaping Use  . Vaping Use: Never used  Substance and Sexual Activity  . Alcohol use: No    Alcohol/week: 0.0 standard drinks    Comment: Quit 2004  . Drug use: No  . Sexual activity: Not Currently  Other Topics Concern  . Not on file  Social History Narrative   Native of [place], raised poor farming community. . To [place] '78 - finished  College, all but Arts administrator. Married - wife in [place], blocked from [place] to [place]. 1 son '90; 1 dtr '93. 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 Resources depleted.   Right handed.   Caffeine Hot daily one daily.   Social Determinants of Health   Financial Resource Strain: Not on file  Food Insecurity: Not on file  Transportation Needs: Not on file  Physical Activity: Not on file  Stress: Not on file  Social Connections: Not on file   No Known Allergies Family History  Problem Relation Age of Onset  . Diabetes Mother   . Stroke Brother   . Hearing loss Brother   . Colon cancer Neg Hx   . Rectal cancer Neg Hx   . Stomach cancer Neg Hx   . Esophageal cancer Neg Hx      Current Outpatient Medications (Cardiovascular):  .  amLODipine (NORVASC) 10 MG tablet, Take 1 tablet (10 mg total) by mouth daily. Marland Kitchen  atorvastatin (LIPITOR) 40 MG tablet, TAKE 1 TABLET BY MOUTH ONCE DAILY *KEEP  FOLLOW  UP  APPOINTMENT  IN  [facility]  FOR  REFILLS* (Patient taking differently: Take 40 mg by mouth daily.) .  metoprolol tartrate (LOPRESSOR) 50 MG tablet, Take 1 tablet (50 mg total) by mouth 2 (two) times daily.   Current Outpatient Medications (Analgesics):  .  acetaminophen (TYLENOL) 500 MG tablet, Take 1,000 mg by mouth every 6 (six) hours as needed for moderate pain.  Marland Kitchen  allopurinol (ZYLOPRIM) 100 MG tablet, Take 2 tablets by mouth once daily (Patient taking differently: Take 200 mg by mouth daily.) .  aspirin EC 81 MG tablet, Take 81 mg by mouth daily. .  colchicine 0.6 MG tablet, Take 1 tablet by mouth once daily  Current Outpatient Medications (Hematological):  .  ferrous sulfate 325 (65 FE) MG tablet, Take 325 mg by mouth daily with breakfast.  Current Outpatient Medications (Other):  .  cholecalciferol (VITAMIN D3) 25 MCG (1000 UNIT) tablet, Take 1,000 Units by mouth daily. .  hydroxypropyl methylcellulose / hypromellose (ISOPTO TEARS / GONIOVISC)  2.5 % ophthalmic solution, Place 1-2 drops into both eyes 3 (three) times daily as needed (dry/irritated eyes.). Marland Kitchen  linaclotide (LINZESS) 145 MCG CAPS capsule, Take 145 mcg by mouth daily before breakfast. .  meclizine (ANTIVERT) 25 MG tablet, Take 1 tablet (25 mg total) by mouth 2 (two) times daily as needed for up to 20 doses for dizziness. .  Multiple Vitamin (MULTIVITAMIN WITH MINERALS) TABS tablet, Take 1 tablet by mouth daily. .  Potassium 99 MG TABS, Take 99 mg by mouth daily.    Reviewed prior external information including notes and imaging from  primary care provider As well as notes that were available from care everywhere and other healthcare  systems.  Past medical history, social, surgical and family history all reviewed in electronic medical record.  No pertanent information unless stated regarding to the chief complaint.   Review of Systems:  No headache, visual changes, nausea, vomiting, diarrhea, constipation, dizziness, abdominal pain, skin rash, fevers, chills, night sweats, weight loss, swollen lymph nodes, body aches, joint swelling, chest pain, shortness of breath, mood changes. POSITIVE muscle aches  Objective  Blood pressure 128/82, pulse 71, height 6\' 1"  (1.854 m), weight 268 lb (121.6 kg), SpO2 99 %.   General: No apparent distress alert and oriented x3 mood and affect normal, dressed appropriately.  HEENT: Pupils equal, extraocular movements intact  Respiratory: Patient's speak in full sentences and does not appear short of breath  Severely antalgic gait. Right knee exam has only a trace effusion.  Severe arthritic changes noted.  Patient does have some mild peripheral edema bilaterally of the lower extremities.  Right knee does have instability with valgus and varus force.  Tender to palpation more in the patellofemoral area.  No warmness or erythema that was stated at last exam.    Impression and Recommendations:     The above documentation has been reviewed  and is accurate and complete Lyndal Pulley, DO

## 2020-06-12 ENCOUNTER — Encounter: Payer: Self-pay | Admitting: Internal Medicine

## 2020-06-12 ENCOUNTER — Ambulatory Visit (INDEPENDENT_AMBULATORY_CARE_PROVIDER_SITE_OTHER): Payer: Medicare Other | Admitting: Internal Medicine

## 2020-06-12 VITALS — BP 124/76 | HR 65 | Temp 98.1°F | Ht 73.0 in | Wt 268.0 lb

## 2020-06-12 DIAGNOSIS — N1831 Chronic kidney disease, stage 3a: Secondary | ICD-10-CM | POA: Diagnosis not present

## 2020-06-12 DIAGNOSIS — I10 Essential (primary) hypertension: Secondary | ICD-10-CM

## 2020-06-12 DIAGNOSIS — R42 Dizziness and giddiness: Secondary | ICD-10-CM | POA: Diagnosis not present

## 2020-06-12 DIAGNOSIS — R7309 Other abnormal glucose: Secondary | ICD-10-CM

## 2020-06-12 DIAGNOSIS — M170 Bilateral primary osteoarthritis of knee: Secondary | ICD-10-CM | POA: Diagnosis not present

## 2020-06-12 DIAGNOSIS — E559 Vitamin D deficiency, unspecified: Secondary | ICD-10-CM

## 2020-06-12 DIAGNOSIS — E7849 Other hyperlipidemia: Secondary | ICD-10-CM

## 2020-06-12 MED ORDER — TRAMADOL HCL 50 MG PO TABS: 50.0000 mg | ORAL_TABLET | Freq: Four times a day (QID) | ORAL | 0 refills | Status: DC | PRN

## 2020-06-12 NOTE — Assessment & Plan Note (Signed)
Lab Results  Component Value Date   LDLCALC 56 11/13/2019   Goal ldl < 70, cont lipitor 40

## 2020-06-12 NOTE — Assessment & Plan Note (Signed)
Lab Results  Component Value Date   CREATININE 2.10 (H) 05/22/2020  stable recently, also for f/u lab today

## 2020-06-12 NOTE — Assessment & Plan Note (Signed)
BP Readings from Last 3 Encounters:  06/12/20 124/76  06/11/20 128/82  06/10/20 126/72  stable, cont amlodpine 10

## 2020-06-12 NOTE — Assessment & Plan Note (Signed)
Lab Results  Component Value Date   HGBA1C 5.9 11/13/2019   Stable, cont diet and wt control efforts

## 2020-06-12 NOTE — Patient Instructions (Signed)
Ok for the tramadol refill  Please continue all other medications as before  Please have the pharmacy call with any other refills you may need.  Please continue your efforts at being more active, low cholesterol diet, and weight control.  Please keep your appointments with your specialists as you may have planned  Please go to the LAB at the blood drawing area for the tests to be done  You will be contacted by phone if any changes need to be made immediately.  Otherwise, you will receive a letter about your results with an explanation, but please check with MyChart first.  Please remember to sign up for MyChart if you have not done so, as this will be important to you in the future with finding out test results, communicating by private email, and scheduling acute appointments online when needed.  Please make an Appointment to return in 6 months, or sooner if needed

## 2020-06-12 NOTE — Assessment & Plan Note (Signed)
Last vitamin D Lab Results  Component Value Date   VD25OH 56.36 11/13/2019  stable, cont oral replacement

## 2020-06-12 NOTE — Assessment & Plan Note (Signed)
Stable, cont to monitor symptoms

## 2020-06-12 NOTE — Progress Notes (Signed)
Established Patient Office Visit  Subjective:  Patient ID: [name], male    DOB: 1951-02-13  Age: 70 y.o. MRN: [id number]  CC:  Chief Complaint  Patient presents with  . Follow-up    1 month follow up  of ckd, chronic bilateral knee pain, htn, and recent dizziness seen in ED dec 16        HPI:  [name] is a 70 y.o. male here to f/u recent ED visit with an recurrent but unusually sever episode of dizziness, worse with change in position but no n/v, better at rest, mildly worse with walking. Has not recurred since then as severe, no falls.  Also, abd pain improved, Denies worsening reflux, dysphagia, n/v, bowel change or blood, as he has been able to pass BM without straining in the past wk.  Does also have ongoing bilateral knee pain with severe pain at times, but is able to continue walking daily over 3 miles without giveaways or falls up to 5 days per wk. Has been seeing Dr Smith/sport med  but did not have to have injection yesterday.     Wt Readings from Last 3 Encounters:  06/12/20 268 lb (121.6 kg)  06/11/20 268 lb (121.6 kg)  06/10/20 269 lb (122 kg)   BP Readings from Last 3 Encounters:  06/12/20 124/76  06/11/20 128/82  06/10/20 126/72         Past Medical History:  Diagnosis Date  . Anemia    normal Fe, nl B12, nl retic, nl EPO July '13  . Aortic regurgitation    Moderate AI 04/17/19 echo  . Blood transfusion without reported diagnosis   . BPH (benign prostatic hyperplasia)   . Chronic back pain   . Chronic kidney disease    CKD III, obstructive nephropathy  . Diverticulosis   . Dysuria   . Elevated PSA, greater than or equal to 20 ng/ml June '13   PSA 107  . Hemorrhoids, internal, with bleeding, prolapse 09/19/2014  . Hyperlipidemia 05/29/2014  . Hypertension   . Obstructive uropathy 11/27/2015  . Pre-diabetes    per patient "reduced sugar intake"  . Renal insufficiency   . Tuberculosis    h/o PPD +  . UTI (urinary tract infection)   . Vitamin D  deficiency 09/13/2017   Past Surgical History:  Procedure Laterality Date  . CARPAL TUNNEL RELEASE Right   . COLONOSCOPY    . HEMORRHOID BANDING    . INSERTION OF MESH N/A 10/17/2019   Procedure: Insertion Of Mesh;  Surgeon: Ralene Ok, MD;  Location: [place];  Service: General;  Laterality: N/A;  . SPLENECTOMY    . XI ROBOTIC ASSISTED SIMPLE PROSTATECTOMY N/A 03/09/2019   Procedure: XI ROBOTIC ASSISTED SIMPLE PROSTATECTOMY;  Surgeon: Cleon Gustin, MD;  Location: [facility];  Service: Urology;  Laterality: N/A;  . XI ROBOTIC ASSISTED VENTRAL HERNIA N/A 10/17/2019   Procedure: XI ROBOTIC ASSISTED INCISIONAL HERNIA REPAIR WITH MESH;  Surgeon: Ralene Ok, MD;  Location: [place];  Service: General;  Laterality: N/A;    reports that he quit smoking about 40 years ago. His smoking use included cigarettes. He has never used smokeless tobacco. He reports that he does not drink alcohol and does not use drugs. family history includes Diabetes in his mother; Hearing loss in his brother; Stroke in his brother. No Known Allergies Current Outpatient Medications on File Prior to Visit  Medication Sig Dispense Refill  . acetaminophen (TYLENOL) 500 MG tablet Take 1,000 mg  by mouth every 6 (six) hours as needed for moderate pain.     Marland Kitchen allopurinol (ZYLOPRIM) 100 MG tablet Take 2 tablets by mouth once daily (Patient taking differently: Take 200 mg by mouth daily.) 180 tablet 0  . amLODipine (NORVASC) 10 MG tablet Take 1 tablet (10 mg total) by mouth daily. 90 tablet 3  . aspirin EC 81 MG tablet Take 81 mg by mouth daily.    Marland Kitchen atorvastatin (LIPITOR) 40 MG tablet TAKE 1 TABLET BY MOUTH ONCE DAILY *KEEP  FOLLOW  UP  APPOINTMENT  IN  [facility]  FOR  REFILLS* (Patient taking differently: Take 40 mg by mouth daily.) 90 tablet 0  . cholecalciferol (VITAMIN D3) 25 MCG (1000 UNIT) tablet Take 1,000 Units by mouth daily.    . colchicine 0.6 MG tablet Take 1 tablet by mouth once daily 30 tablet 0  . ferrous sulfate  325 (65 FE) MG tablet Take 325 mg by mouth daily with breakfast.    . hydroxypropyl methylcellulose / hypromellose (ISOPTO TEARS / GONIOVISC) 2.5 % ophthalmic solution Place 1-2 drops into both eyes 3 (three) times daily as needed (dry/irritated eyes.). 15 mL 2  . linaclotide (LINZESS) 145 MCG CAPS capsule Take 145 mcg by mouth daily before breakfast.    . meclizine (ANTIVERT) 25 MG tablet Take 1 tablet (25 mg total) by mouth 2 (two) times daily as needed for up to 20 doses for dizziness. 20 tablet 0  . metoprolol tartrate (LOPRESSOR) 50 MG tablet Take 1 tablet (50 mg total) by mouth 2 (two) times daily. 180 tablet 3  . Multiple Vitamin (MULTIVITAMIN WITH MINERALS) TABS tablet Take 1 tablet by mouth daily.    . Potassium 99 MG TABS Take 99 mg by mouth daily.     Marland Kitchen MYRBETRIQ 25 MG TB24 tablet Take 25 mg by mouth daily. (Patient not taking: Reported on 06/12/2020)    . [DISCONTINUED] Hyoscyamine Sulfate SL (LEVSIN/SL) 0.125 MG SUBL Place 0.125 mg under the tongue every 6 (six) hours as needed (bladder spasm). (Patient not taking: Reported on 07/06/2019) 30 tablet 2   No current facility-administered medications on file prior to visit.        ROS:  All others reviewed and negative.  Objective        PE:  BP 124/76 (BP Location: Left Arm, Patient Position: Sitting, Cuff Size: Normal)   Pulse 65   Temp 98.1 F (36.7 C) (Oral)   Ht 6\' 1"  (1.854 m)   Wt 268 lb (121.6 kg)   SpO2 95%   BMI 35.36 kg/m                 Constitutional: Pt appears in NAD               HENT: Head: NCAT.                Right Ear: External ear normal.                 Left Ear: External ear normal.                Eyes: . Pupils are equal, round, and reactive to light. Conjunctivae and EOM are normal               Nose: without d/c or deformity               Neck: Neck supple. Gross normal ROM  Cardiovascular: Normal rate and regular rhythm.                 Pulmonary/Chest: Effort normal and breath sounds  without rales or wheezing.                Abd:  Soft, NT, ND, + BS, no organomegaly               Neurological: Pt is alert. At baseline orientation, motor grossly intact               Skin: Skin is warm. No rashes, no other new lesions, LE edema - none               Psychiatric: Pt behavior is normal without agitation   Assessment/Plan:  Nehal Shives is a 70 y.o. Black or African American [2] male with  has a past medical history of Anemia, Aortic regurgitation, Blood transfusion without reported diagnosis, BPH (benign prostatic hyperplasia), Chronic back pain, Chronic kidney disease, Diverticulosis, Dysuria, Elevated PSA, greater than or equal to 20 ng/ml (June '13), Hemorrhoids, internal, with bleeding, prolapse (09/19/2014), Hyperlipidemia (05/29/2014), Hypertension, Obstructive uropathy (11/27/2015), Pre-diabetes, Renal insufficiency, Tuberculosis, UTI (urinary tract infection), and Vitamin D deficiency (09/13/2017).   Assessment Plan  See problem oriented assessment and plan Labs reviewed for each problem: Lab Results  Component Value Date   WBC 6.6 05/22/2020   HGB 14.7 05/22/2020   HCT 45.2 05/22/2020   PLT 282 05/22/2020   GLUCOSE 83 05/22/2020   CHOL 118 11/13/2019   TRIG 79.0 11/13/2019   HDL 45.90 11/13/2019   LDLCALC 56 11/13/2019   ALT 18 05/22/2020   AST 24 05/22/2020   NA 141 05/22/2020   K 3.3 (L) 05/22/2020   CL 101 05/22/2020   CREATININE 2.10 (H) 05/22/2020   BUN 33 (H) 05/22/2020   CO2 31 05/22/2020   TSH 1.39 07/06/2019   PSA 0.27 07/06/2019   INR 1.2 12/05/2019   HGBA1C 5.9 11/13/2019    Micro: none  Cardiac tracings I have personally interpreted today:  none  Pertinent Radiological findings (summarize): none    Health Maintenance Due  Topic Date Due  . COVID-19 Vaccine (3 - Booster for Pfizer series) 02/28/2020    There are no preventive care reminders to display for this patient.  Lab Results  Component Value Date   TSH 1.39 07/06/2019   Lab  Results  Component Value Date   WBC 6.6 05/22/2020   HGB 14.7 05/22/2020   HCT 45.2 05/22/2020   MCV 89.5 05/22/2020   PLT 282 05/22/2020   Lab Results  Component Value Date   NA 141 05/22/2020   K 3.3 (L) 05/22/2020   CO2 31 05/22/2020   GLUCOSE 83 05/22/2020   BUN 33 (H) 05/22/2020   CREATININE 2.10 (H) 05/22/2020   BILITOT 1.0 05/22/2020   ALKPHOS 98 05/22/2020   AST 24 05/22/2020   ALT 18 05/22/2020   PROT 7.3 05/22/2020   ALBUMIN 4.1 05/22/2020   CALCIUM 9.0 05/22/2020   ANIONGAP 9 05/22/2020   GFR 41.93 (L) 11/13/2019   Lab Results  Component Value Date   CHOL 118 11/13/2019   Lab Results  Component Value Date   HDL 45.90 11/13/2019   Lab Results  Component Value Date   LDLCALC 56 11/13/2019   Lab Results  Component Value Date   TRIG 79.0 11/13/2019   Lab Results  Component Value Date   CHOLHDL 3 11/13/2019   Lab Results  Component  Value Date   HGBA1C 5.9 11/13/2019      Assessment & Plan:   Problem List Items Addressed This Visit      Medium   Vitamin D deficiency    Last vitamin D Lab Results  Component Value Date   VD25OH 56.36 11/13/2019  stable, cont oral replacement      Relevant Orders   VITAMIN D 25 Hydroxy (Vit-D Deficiency, Fractures)   Hyperlipidemia    Lab Results  Component Value Date   LDLCALC 56 11/13/2019   Goal ldl < 70, cont lipitor 40      Relevant Orders   Lipid panel   Hepatic function panel   CBC with Differential/Platelet   TSH   Urinalysis, Routine w reflex microscopic   Basic metabolic panel   Essential hypertension    BP Readings from Last 3 Encounters:  06/12/20 124/76  06/11/20 128/82  06/10/20 126/72  stable, cont amlodpine 10      CKD (chronic kidney disease) stage 3, GFR 30-59 ml/min (HCC)    Lab Results  Component Value Date   CREATININE 2.10 (H) 05/22/2020  stable recently, also for f/u lab today      Relevant Orders   PTH, intact and calcium   Phosphorus   Abnormal glucose     Lab Results  Component Value Date   HGBA1C 5.9 11/13/2019   Stable, cont diet and wt control efforts      Relevant Orders   Hemoglobin A1c     Low   Dizziness - Primary    Stable, cont to monitor symptoms      Degenerative arthritis of knee, bilateral    For tramadol refill, f/u sport med as planned      Relevant Medications   traMADol (ULTRAM) 50 MG tablet      Meds ordered this encounter  Medications  . traMADol (ULTRAM) 50 MG tablet    Sig: Take 1 tablet (50 mg total) by mouth every 6 (six) hours as needed.    Dispense:  30 tablet    Refill:  0    Follow-up: Return in about 6 months (around 12/10/2020).   Cathlean Cower, MD 06/12/2020 8:28 PM [facility]

## 2020-06-12 NOTE — Assessment & Plan Note (Signed)
For tramadol refill, f/u sport med as planned

## 2020-06-13 ENCOUNTER — Encounter: Payer: Self-pay | Admitting: Internal Medicine

## 2020-06-13 ENCOUNTER — Other Ambulatory Visit: Payer: Self-pay | Admitting: Internal Medicine

## 2020-06-13 DIAGNOSIS — N1831 Chronic kidney disease, stage 3a: Secondary | ICD-10-CM

## 2020-06-13 LAB — URINALYSIS, ROUTINE W REFLEX MICROSCOPIC
Bilirubin Urine: NEGATIVE
Hgb urine dipstick: NEGATIVE
Ketones, ur: NEGATIVE
Nitrite: NEGATIVE
RBC / HPF: NONE SEEN (ref 0–?)
Specific Gravity, Urine: 1.01 (ref 1.000–1.030)
Total Protein, Urine: NEGATIVE
Urine Glucose: NEGATIVE
Urobilinogen, UA: 0.2 (ref 0.0–1.0)
pH: 7 (ref 5.0–8.0)

## 2020-06-13 LAB — CBC WITH DIFFERENTIAL/PLATELET
Basophils Absolute: 0.1 10*3/uL (ref 0.0–0.1)
Basophils Relative: 0.9 % (ref 0.0–3.0)
Eosinophils Absolute: 0.2 10*3/uL (ref 0.0–0.7)
Eosinophils Relative: 3.2 % (ref 0.0–5.0)
HCT: 43.5 % (ref 39.0–52.0)
Hemoglobin: 14.7 g/dL (ref 13.0–17.0)
Lymphocytes Relative: 39 % (ref 12.0–46.0)
Lymphs Abs: 2.1 10*3/uL (ref 0.7–4.0)
MCHC: 33.8 g/dL (ref 30.0–36.0)
MCV: 87.7 fl (ref 78.0–100.0)
Monocytes Absolute: 0.6 10*3/uL (ref 0.1–1.0)
Monocytes Relative: 10.4 % (ref 3.0–12.0)
Neutro Abs: 2.5 10*3/uL (ref 1.4–7.7)
Neutrophils Relative %: 46.5 % (ref 43.0–77.0)
Platelets: 252 10*3/uL (ref 150.0–400.0)
RBC: 4.96 Mil/uL (ref 4.22–5.81)
RDW: 14.6 % (ref 11.5–15.5)
WBC: 5.4 10*3/uL (ref 4.0–10.5)

## 2020-06-13 LAB — BASIC METABOLIC PANEL
BUN: 28 mg/dL — ABNORMAL HIGH (ref 6–23)
CO2: 37 mEq/L — ABNORMAL HIGH (ref 19–32)
Calcium: 9.5 mg/dL (ref 8.4–10.5)
Chloride: 101 mEq/L (ref 96–112)
Creatinine, Ser: 2.33 mg/dL — ABNORMAL HIGH (ref 0.40–1.50)
GFR: 27.74 mL/min — ABNORMAL LOW (ref >60.00)
Glucose, Bld: 94 mg/dL (ref 70–99)
Potassium: 3.8 mEq/L (ref 3.5–5.1)
Sodium: 142 mEq/L (ref 135–145)

## 2020-06-13 LAB — HEPATIC FUNCTION PANEL
ALT: 17 U/L (ref 0–53)
AST: 19 U/L (ref 0–37)
Albumin: 4.1 g/dL (ref 3.5–5.2)
Alkaline Phosphatase: 82 U/L (ref 39–117)
Bilirubin, Direct: 0.2 mg/dL (ref 0.0–0.3)
Total Bilirubin: 1.1 mg/dL (ref 0.2–1.2)
Total Protein: 7.3 g/dL (ref 6.0–8.3)

## 2020-06-13 LAB — LIPID PANEL
Cholesterol: 127 mg/dL (ref 0–200)
HDL: 52.9 mg/dL (ref >39.00)
LDL Cholesterol: 61 mg/dL (ref 0–99)
NonHDL: 74.01
Total CHOL/HDL Ratio: 2
Triglycerides: 67 mg/dL (ref 0.0–149.0)
VLDL: 13.4 mg/dL (ref 0.0–40.0)

## 2020-06-13 LAB — PHOSPHORUS: Phosphorus: 3 mg/dL (ref 2.3–4.6)

## 2020-06-13 LAB — HEMOGLOBIN A1C: Hgb A1c MFr Bld: 5.7 % (ref 4.6–6.5)

## 2020-06-13 LAB — VITAMIN D 25 HYDROXY (VIT D DEFICIENCY, FRACTURES): VITD: 66.35 ng/mL (ref 30.00–100.00)

## 2020-06-13 LAB — TSH: TSH: 1.51 u[IU]/mL (ref 0.35–4.50)

## 2020-06-16 LAB — PTH, INTACT AND CALCIUM
Calcium: 9.4 mg/dL (ref 8.6–10.3)
PTH: 68 pg/mL — ABNORMAL HIGH (ref 14–64)

## 2020-07-02 ENCOUNTER — Other Ambulatory Visit: Payer: Self-pay

## 2020-07-02 ENCOUNTER — Telehealth: Payer: Self-pay | Admitting: Gastroenterology

## 2020-07-02 MED ORDER — LINACLOTIDE 145 MCG PO CAPS: 145.0000 ug | ORAL_CAPSULE | Freq: Every day | ORAL | 4 refills | Status: DC

## 2020-07-02 NOTE — Telephone Encounter (Signed)
Ok to send prescription for Linzess 145 mcg daily #90 refill 4

## 2020-07-03 NOTE — Telephone Encounter (Signed)
Sent prescription for Linzess 145 mcg to patient's pharmacy.

## 2020-07-09 ENCOUNTER — Other Ambulatory Visit: Payer: Self-pay

## 2020-07-09 ENCOUNTER — Ambulatory Visit (INDEPENDENT_AMBULATORY_CARE_PROVIDER_SITE_OTHER): Payer: Medicare Other

## 2020-07-09 ENCOUNTER — Ambulatory Visit (INDEPENDENT_AMBULATORY_CARE_PROVIDER_SITE_OTHER): Payer: Medicare Other | Admitting: Family Medicine

## 2020-07-09 ENCOUNTER — Encounter: Payer: Self-pay | Admitting: Family Medicine

## 2020-07-09 VITALS — BP 122/72 | HR 65 | Ht 73.0 in | Wt 268.0 lb

## 2020-07-09 DIAGNOSIS — M25522 Pain in left elbow: Secondary | ICD-10-CM

## 2020-07-09 DIAGNOSIS — M19021 Primary osteoarthritis, right elbow: Secondary | ICD-10-CM | POA: Diagnosis not present

## 2020-07-09 DIAGNOSIS — M25521 Pain in right elbow: Secondary | ICD-10-CM

## 2020-07-09 DIAGNOSIS — M19022 Primary osteoarthritis, left elbow: Secondary | ICD-10-CM | POA: Diagnosis not present

## 2020-07-09 DIAGNOSIS — M170 Bilateral primary osteoarthritis of knee: Secondary | ICD-10-CM

## 2020-07-09 NOTE — Assessment & Plan Note (Signed)
Patient fell backwards landing on his elbows.  Patient has some pain.  We will get x-rays to rule out fracture but patient does seem to be doing relatively well with full range of motion.  Discussed Arnica lotion icing and given some range of motion exercises.  Patient will follow up with me again in 6 to 8 weeks or can come back sooner if worsening pain.

## 2020-07-09 NOTE — Assessment & Plan Note (Signed)
Viscosupplementation given the right knee.  Seems to be 1 is giving him the most pain.  Discussed continuing to monitor.  Patient did have a mild hemarthrosis but did not seem to have anything else initially.  Has done well after the antibiotics.  Hoping that this will give some relief.  Patient wants to avoid surgical intervention still but we will continue to talk about it at each follow-up.  Patient will follow up again in 6 to 8 weeks.
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
## 2020-07-09 NOTE — Patient Instructions (Signed)
Xray today Injected knee today See me again in 6-8 weeks

## 2020-07-23 ENCOUNTER — Ambulatory Visit: Payer: Medicare Other | Admitting: Internal Medicine

## 2020-07-23 ENCOUNTER — Encounter: Payer: Self-pay | Admitting: Internal Medicine

## 2020-07-23 VITALS — BP 144/76 | HR 63 | Ht 73.0 in | Wt 269.4 lb

## 2020-07-23 DIAGNOSIS — K642 Third degree hemorrhoids: Secondary | ICD-10-CM | POA: Diagnosis not present

## 2020-07-23 DIAGNOSIS — K5909 Other constipation: Secondary | ICD-10-CM

## 2020-07-23 DIAGNOSIS — K5641 Fecal impaction: Secondary | ICD-10-CM | POA: Diagnosis not present

## 2020-07-23 MED ORDER — LACTULOSE 10 GM/15ML PO SOLN: 20.0000 g | Freq: Every day | ORAL | 2 refills | Status: DC

## 2020-07-23 NOTE — Progress Notes (Signed)
Kerman Pfost 70 y.o. 1950-10-29 [id number]  Assessment & Plan:   Encounter Diagnoses  Name Primary?  . Chronic constipation Yes  . Fecal impaction ([place])   . Prolapsed internal hemorrhoids, grade 3     Its not surprising that Linzess was unaffordable in the Medicare population.  It is unfortunately rare that there is good coverage for that.  We looked at good Rx and it would still be $500 a month.  I am going to have him do a MiraLAX purge to try to clear this impaction and then start daily lactulose.  He will return in about 6 weeks.  We will address hemorrhoids again at that time.  It was difficult to see today due to the impaction.  I would not treat those right now anyway.  Meds ordered this encounter  Medications  . lactulose (CHRONULAC) 10 GM/15ML solution    Sig: Take 30 mLs (20 g total) by mouth daily.    Dispense:  946 mL    Refill:  2      Subjective:   Chief Complaint: Constipation  HPI The patient is a 70 year old man from [place] originally, who was seen by me in the past with rectal bleeding internal hemorrhoids that I banded, and that was in 2016.  He is having problems with constipation again.  He had seen Alonza Bogus on June 10, 2020 samples of Linzess provided but though it helped he cannot afford it.  He reports persistent problems with difficulty in defecation.  Feels like he needs to go today but is unable.  He describes prolapsing hemorrhoid without bleeding. No Known Allergies Current Meds  Medication Sig  . acetaminophen (TYLENOL) 500 MG tablet Take 1,000 mg by mouth every 6 (six) hours as needed for moderate pain.   Marland Kitchen allopurinol (ZYLOPRIM) 100 MG tablet Take by mouth See admin instructions. Take 2 tablets by mouth daily  . amLODipine (NORVASC) 10 MG tablet Take 10 mg by mouth 2 (two) times daily.  Marland Kitchen aspirin EC 81 MG tablet Take 81 mg by mouth daily.  Marland Kitchen atorvastatin (LIPITOR) 40 MG tablet Take 40 mg by mouth daily.  . cholecalciferol (VITAMIN  D3) 25 MCG (1000 UNIT) tablet Take 1,000 Units by mouth daily.  . colchicine 0.6 MG tablet Take 1 tablet by mouth once daily  . ferrous sulfate 325 (65 FE) MG tablet Take 325 mg by mouth daily with breakfast.  . hydroxypropyl methylcellulose / hypromellose (ISOPTO TEARS / GONIOVISC) 2.5 % ophthalmic solution Place 1-2 drops into both eyes 3 (three) times daily as needed (dry/irritated eyes.).  Marland Kitchen linaclotide (LINZESS) 145 MCG CAPS capsule Take 1 capsule (145 mcg total) by mouth daily before breakfast.  . meclizine (ANTIVERT) 25 MG tablet Take 1 tablet (25 mg total) by mouth 2 (two) times daily as needed for up to 20 doses for dizziness.  . metoprolol tartrate (LOPRESSOR) 50 MG tablet Take 1 tablet (50 mg total) by mouth 2 (two) times daily.  . Multiple Vitamin (MULTIVITAMIN WITH MINERALS) TABS tablet Take 1 tablet by mouth daily.  Marland Kitchen MYRBETRIQ 25 MG TB24 tablet Take 25 mg by mouth daily.  . Potassium 99 MG TABS Take 99 mg by mouth daily.   . traMADol (ULTRAM) 50 MG tablet Take 1 tablet (50 mg total) by mouth every 6 (six) hours as needed.   Past Medical History:  Diagnosis Date  . Anemia    normal Fe, nl B12, nl retic, nl EPO July '13  . Aortic regurgitation  Moderate AI 04/17/19 echo  . Blood transfusion without reported diagnosis   . BPH (benign prostatic hyperplasia)   . Chronic back pain   . Chronic kidney disease    CKD III, obstructive nephropathy  . Diverticulosis   . Dysuria   . Elevated PSA, greater than or equal to 20 ng/ml June '13   PSA 107  . Hemorrhoids, internal, with bleeding, prolapse 09/19/2014  . Hyperlipidemia 05/29/2014  . Hypertension   . Obstructive uropathy 11/27/2015  . Pre-diabetes    per patient "reduced sugar intake"  . Renal insufficiency   . Tuberculosis    h/o PPD +  . UTI (urinary tract infection)   . Vitamin D deficiency 09/13/2017   Past Surgical History:  Procedure Laterality Date  . CARPAL TUNNEL RELEASE Right   . COLONOSCOPY    . HEMORRHOID  BANDING    . INSERTION OF MESH N/A 10/17/2019   Procedure: Insertion Of Mesh;  Surgeon: Ralene Ok, MD;  Location: [facility];  Service: General;  Laterality: N/A;  . SPLENECTOMY    . XI ROBOTIC ASSISTED SIMPLE PROSTATECTOMY N/A 03/09/2019   Procedure: XI ROBOTIC ASSISTED SIMPLE PROSTATECTOMY;  Surgeon: Cleon Gustin, MD;  Location: [facility];  Service: Urology;  Laterality: N/A;  . XI ROBOTIC ASSISTED VENTRAL HERNIA N/A 10/17/2019   Procedure: XI ROBOTIC ASSISTED INCISIONAL HERNIA REPAIR WITH MESH;  Surgeon: Ralene Ok, MD;  Location: [facility] OR;  Service: General;  Laterality: N/A;   Social History   Social History Narrative   Native of [place], raised poor farming community. . To [place] '78 - finished College, all but Arts administrator. Married - wife in [place], blocked from [place] to [place]. 1 son '90; 1 dtr '93. 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 Resources depleted.   Right handed.   Caffeine Hot daily one daily.   family history includes Diabetes in his mother; Hearing loss in his brother; Stroke in his brother.   Review of Systems As above  Objective:   Physical Exam BP (!) 144/76   Pulse 63   Ht 6\' 1"  (1.854 m)   Wt 269 lb 6.4 oz (122.2 kg)   SpO2 97%   BMI 35.54 kg/m  Well-developed well-nourished black African man in no acute distress Abdomen is somewhat distended soft and nontender  Rectal exam shows a fecal impaction and a firm round impaction.  Moderately tender on exam but no fissure palpated.  Anoscopy is performed it is difficult to get a good view he does have at least grade 2 internal hemorrhoids I believe.  I do not see a fissure.

## 2020-07-23 NOTE — Patient Instructions (Addendum)
Dr Carlean Purl recommends that you complete a bowel purge (to clean out your bowels). Please do the following: Purchase a bottle of Miralax over the counter as well as a box of 5 mg dulcolax tablets. Take 4 dulcolax tablets. Wait 1 hour. You will then drink 6-8 capfuls of Miralax mixed in an adequate amount of water/juice/gatorade (you may choose which of these liquids to drink) over the next 2-3 hours. You should expect results within 1 to 6 hours after completing the bowel purge.   We have sent the following medications to your pharmacy for you to pick up at your convenience: Lactulose , use Goodrx card if too expensive.   Follow up with Dr Carlean Purl in 6 weeks.Call back sooner if not better.  I appreciate the opportunity to care for you. Silvano Rusk, MD, [facility]

## 2020-07-24 NOTE — Progress Notes (Signed)
[name]: [phone] Subjective:   [name], am serving as a scribe for Dr. Hulan Saas. This visit occurred during the SARS-CoV-2 public health emergency.  Safety protocols were in place, including screening questions prior to the visit, additional usage of staff PPE, and extensive cleaning of exam room while observing appropriate contact time as indicated for disinfecting solutions.   I'm seeing this patient by the request  of:  Biagio Borg, MD  CC: Right knee pain follow-up  RDE:[id number]   07/09/2020 Patient fell backwards landing on his elbows.  Patient has some pain.  We will get x-rays to rule out fracture but patient does seem to be doing relatively well with full range of motion.  Discussed Arnica lotion icing and given some range of motion exercises.  Patient will follow up with me again in 6 to 8 weeks or can come back sooner if worsening pain.  Viscosupplementation given the right knee.  Seems to be 1 is giving him the most pain.  Discussed continuing to monitor.  Patient did have a mild hemarthrosis but did not seem to have anything else initially.  Has done well after the antibiotics.  Hoping that this will give some relief.  Patient wants to avoid surgical intervention still but we will continue to talk about it at each follow-up.  Patient will follow up again in 6 to 8 weeks.   Update 07/25/2020 [name] is a 70 y.o. male coming in with complaint of right knee and bilateral elbow pain. Patient states that he did not have as much relief from last injection. Feels that there is more swelling in right knee.   Pain continues in both elbows L>R. Is taking hydrocodone with tylenol for pain reduction.   Notes swelling in feet since last visit.   R Elbow Xray 07/09/2020 IMPRESSION: 1. Questionable nondisplaced right radial head fracture with associated elbow joint effusion. 2. [name] acute  displaced fracture or dislocation involving the left elbow. 3. Degenerative changes of the bilateral elbows, right greater than left.   L Elbow xray 07/09/2020  IMPRESSION: 1. Questionable nondisplaced right radial head fracture with associated elbow joint effusion. 2. [name] acute displaced fracture or dislocation involving the left elbow. 3. Degenerative changes of the bilateral elbows, right greater than left.     Past Medical History:  Diagnosis Date  . Anemia    normal Fe, nl B12, nl retic, nl EPO July '13  . Aortic regurgitation    Moderate AI 04/17/19 echo  . Blood transfusion without reported diagnosis   . BPH (benign prostatic hyperplasia)   . Chronic back pain   . Chronic kidney disease    CKD III, obstructive nephropathy  . Diverticulosis   . Dysuria   . Elevated PSA, greater than or equal to 20 ng/ml June '13   PSA 107  . Hemorrhoids, internal, with bleeding, prolapse 09/19/2014  . Hyperlipidemia 05/29/2014  . Hypertension   . Obstructive uropathy 11/27/2015  . Pre-diabetes    per patient "reduced sugar intake"  . Renal insufficiency   . Tuberculosis    h/o PPD +  . UTI (urinary tract infection)   . Vitamin D deficiency 09/13/2017   Past Surgical History:  Procedure Laterality Date  . CARPAL TUNNEL RELEASE Right   . COLONOSCOPY    . HEMORRHOID BANDING    . INSERTION OF MESH N/A 10/17/2019   Procedure: Insertion Of Mesh;  Surgeon:  Ralene Ok, MD;  Location: [facility];  Service: General;  Laterality: N/A;  . SPLENECTOMY    . XI ROBOTIC ASSISTED SIMPLE PROSTATECTOMY N/A 03/09/2019   Procedure: XI ROBOTIC ASSISTED SIMPLE PROSTATECTOMY;  Surgeon: Cleon Gustin, MD;  [facility]: [facility];  Service: Urology;  Laterality: N/A;  . XI ROBOTIC ASSISTED VENTRAL HERNIA N/A 10/17/2019   Procedure: XI ROBOTIC ASSISTED INCISIONAL HERNIA REPAIR WITH MESH;  Surgeon: Ralene Ok, MD;  Location: [facility];  Service: General;  Laterality: N/A;   Social History    Socioeconomic History  . Marital status: Single    Spouse name: Not on file  . Number of children: 2  . Years of education: 14  . Highest education level: Some college, [name] degree  Occupational History  . Occupation: Lobbyist: [employer]    Comment: unemployed  Tobacco Use  . Smoking status: Former Smoker    Types: Cigarettes    Quit date: 02/26/1980    Years since quitting: 40.4  . Smokeless tobacco: Never Used  Vaping Use  . Vaping Use: Never used  Substance and Sexual Activity  . Alcohol use: [name]    Alcohol/week: 0.0 standard drinks  . Drug use: [name]  . Sexual activity: Not Currently  Other Topics Concern  . Not on file  Social History Narrative   Native of [place], raised poor farming community. . To [place] '78 - finished College, all but Arts administrator. Married - wife in [place], blocked from [place] to [place]. 1 son '90; 1 dtr '93. 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 Resources depleted.   Right handed.   Caffeine Hot daily one daily.   Social Determinants of Health   Financial Resource Strain: Not on file  Food Insecurity: Not on file  Transportation Needs: Not on file  Physical Activity: Not on file  Stress: Not on file  Social Connections: Not on file   [name] Known Allergies Family History  Problem Relation Age of Onset  . Diabetes Mother   . Stroke Brother   . Hearing loss Brother   . Colon cancer Neg Hx   . Rectal cancer Neg Hx   . Stomach cancer Neg Hx   . Esophageal cancer Neg Hx      Current Outpatient Medications (Cardiovascular):  .  amLODipine (NORVASC) 10 MG tablet, Take 10 mg by mouth 2 (two) times daily. Marland Kitchen  atorvastatin (LIPITOR) 40 MG tablet, Take 40 mg by mouth daily. .  metoprolol tartrate (LOPRESSOR) 50 MG tablet, Take 1 tablet (50 mg total) by mouth 2 (two) times daily.   Current Outpatient Medications (Analgesics):  .  acetaminophen (TYLENOL) 500 MG tablet, Take 1,000 mg by mouth  every 6 (six) hours as needed for moderate pain.  Marland Kitchen  allopurinol (ZYLOPRIM) 100 MG tablet, Take by mouth See admin instructions. Take 2 tablets by mouth daily .  aspirin EC 81 MG tablet, Take 81 mg by mouth daily. .  colchicine 0.6 MG tablet, Take 1 tablet by mouth once daily .  traMADol (ULTRAM) 50 MG tablet, Take 1 tablet (50 mg total) by mouth every 6 (six) hours as needed.  Current Outpatient Medications (Hematological):  .  ferrous sulfate 325 (65 FE) MG tablet, Take 325 mg by mouth daily with breakfast.  Current Outpatient Medications (Other):  .  cholecalciferol (VITAMIN D3) 25 MCG (1000 UNIT) tablet, Take 1,000 Units by mouth daily. .  hydroxypropyl methylcellulose / hypromellose (ISOPTO TEARS / GONIOVISC)  2.5 % ophthalmic solution, Place 1-2 drops into both eyes 3 (three) times daily as needed (dry/irritated eyes.). Marland Kitchen  lactulose (CHRONULAC) 10 GM/15ML solution, Take 30 mLs (20 g total) by mouth daily. Marland Kitchen  linaclotide (LINZESS) 145 MCG CAPS capsule, Take 1 capsule (145 mcg total) by mouth daily before breakfast. .  meclizine (ANTIVERT) 25 MG tablet, Take 1 tablet (25 mg total) by mouth 2 (two) times daily as needed for up to 20 doses for dizziness. .  Multiple Vitamin (MULTIVITAMIN WITH MINERALS) TABS tablet, Take 1 tablet by mouth daily. Marland Kitchen  MYRBETRIQ 25 MG TB24 tablet, Take 25 mg by mouth daily. .  Potassium 99 MG TABS, Take 99 mg by mouth daily.    Reviewed prior external information including notes and imaging from  primary care provider As well as notes that were available from care everywhere and other healthcare systems.  Past medical history, social, surgical and family history all reviewed in electronic medical record.  [name] pertanent information unless stated regarding to the chief complaint.   Review of Systems:  [name] headache, visual changes, nausea, vomiting, diarrhea, constipation, dizziness, abdominal pain, skin rash, fevers, chills, night sweats, weight loss, swollen  lymph nodes, body aches, joint swelling, chest pain, shortness of breath, mood changes. POSITIVE muscle aches  Objective  Blood pressure 122/82, pulse 72, height 6\' 1"  (1.854 m), weight 269 lb (122 kg), SpO2 96 %.   General: [name] apparent distress alert and oriented x3 mood and affect normal, dressed appropriately.  HEENT: Pupils equal, extraocular movements intact  Respiratory: Patient's speak in full sentences and does not appear short of breath  Cardiovascular: 2+ pitting edema of the lower extremities bilaterally  Severely antalgic Right knee does have significant effusion noted.  [name] warmness to touch though.  Lacks the last 15 degrees secondary to tightness of the knee.  Instability with valgus and varus force. Bilateral elbows do have good range of motion.  Tender to palpation over the right radial head but otherwise good grip strength.  Left elbow also has full range of motion but does have some tenderness noted over the medial epicondylar region.  Mild positive Tinel's.  Good grip strength noted.  Procedure: Real-time Ultrasound Guided Injection of right knee Device: GE Logiq Q7 Ultrasound guided injection is preferred based studies that show increased duration, increased effect, greater accuracy, decreased procedural pain, increased response rate, and decreased cost with ultrasound guided versus blind injection.  Verbal informed consent obtained.  Time-out conducted.  Noted [name] overlying erythema, induration, or other signs of local infection.  Skin prepped in a sterile fashion.  Local anesthesia: Topical Ethyl chloride.  With sterile technique and under real time ultrasound guidance: With a 22-gauge 2 inch needle patient was injected with 4 cc of 0.5% Marcaine and aspirated 35 cc of straw light-colored fluid with debridement injected 1 cc of Kenalog 40 mg/dL. This was from a superior lateral approach.  Completed without difficulty  Pain immediately improved suggesting accurate placement  of the medication.  Advised to call if fevers/chills, erythema, induration, drainage, or persistent bleeding.  Impression: Technically successful ultrasound guided injection.    Impression and Recommendations:     The above documentation has been reviewed and is accurate and complete Lyndal Pulley, DO

## 2020-07-25 ENCOUNTER — Ambulatory Visit (INDEPENDENT_AMBULATORY_CARE_PROVIDER_SITE_OTHER): Payer: Medicare Other

## 2020-07-25 ENCOUNTER — Encounter: Payer: Self-pay | Admitting: Family Medicine

## 2020-07-25 ENCOUNTER — Ambulatory Visit: Payer: Medicare Other | Admitting: Family Medicine

## 2020-07-25 ENCOUNTER — Other Ambulatory Visit: Payer: Self-pay

## 2020-07-25 ENCOUNTER — Ambulatory Visit: Payer: Self-pay

## 2020-07-25 VITALS — BP 122/82 | HR 72 | Ht 73.0 in | Wt 269.0 lb

## 2020-07-25 DIAGNOSIS — N183 Chronic kidney disease, stage 3 unspecified: Secondary | ICD-10-CM | POA: Diagnosis not present

## 2020-07-25 DIAGNOSIS — M2550 Pain in unspecified joint: Secondary | ICD-10-CM | POA: Diagnosis not present

## 2020-07-25 DIAGNOSIS — M19022 Primary osteoarthritis, left elbow: Secondary | ICD-10-CM | POA: Diagnosis not present

## 2020-07-25 DIAGNOSIS — M25522 Pain in left elbow: Secondary | ICD-10-CM | POA: Diagnosis not present

## 2020-07-25 DIAGNOSIS — N179 Acute kidney failure, unspecified: Secondary | ICD-10-CM | POA: Diagnosis not present

## 2020-07-25 DIAGNOSIS — M25561 Pain in right knee: Secondary | ICD-10-CM

## 2020-07-25 DIAGNOSIS — G8929 Other chronic pain: Secondary | ICD-10-CM | POA: Diagnosis not present

## 2020-07-25 DIAGNOSIS — M25521 Pain in right elbow: Secondary | ICD-10-CM | POA: Diagnosis not present

## 2020-07-25 DIAGNOSIS — M170 Bilateral primary osteoarthritis of knee: Secondary | ICD-10-CM | POA: Diagnosis not present

## 2020-07-25 DIAGNOSIS — R600 Localized edema: Secondary | ICD-10-CM

## 2020-07-25 DIAGNOSIS — M19021 Primary osteoarthritis, right elbow: Secondary | ICD-10-CM | POA: Diagnosis not present

## 2020-07-25 LAB — CBC WITH DIFFERENTIAL/PLATELET
Basophils Absolute: 0.1 10*3/uL (ref 0.0–0.1)
Basophils Relative: 2.9 % (ref 0.0–3.0)
Eosinophils Absolute: 0.1 10*3/uL (ref 0.0–0.7)
Eosinophils Relative: 2.9 % (ref 0.0–5.0)
HCT: 42 % (ref 39.0–52.0)
Hemoglobin: 14.3 g/dL (ref 13.0–17.0)
Lymphocytes Relative: 51.8 % — ABNORMAL HIGH (ref 12.0–46.0)
Lymphs Abs: 2.4 10*3/uL (ref 0.7–4.0)
MCHC: 34.1 g/dL (ref 30.0–36.0)
MCV: 86.9 fl (ref 78.0–100.0)
Monocytes Absolute: 0.4 10*3/uL (ref 0.1–1.0)
Monocytes Relative: 9.7 % (ref 3.0–12.0)
Neutro Abs: 1.5 10*3/uL (ref 1.4–7.7)
Neutrophils Relative %: 32.7 % — ABNORMAL LOW (ref 43.0–77.0)
Platelets: 276 10*3/uL (ref 150.0–400.0)
RBC: 4.83 Mil/uL (ref 4.22–5.81)
RDW: 14.2 % (ref 11.5–15.5)
WBC: 4.6 10*3/uL (ref 4.0–10.5)

## 2020-07-25 LAB — COMPREHENSIVE METABOLIC PANEL
ALT: 12 U/L (ref 0–53)
AST: 17 U/L (ref 0–37)
Albumin: 3.9 g/dL (ref 3.5–5.2)
Alkaline Phosphatase: 98 U/L (ref 39–117)
BUN: 22 mg/dL (ref 6–23)
CO2: 37 mEq/L — ABNORMAL HIGH (ref 19–32)
Calcium: 9.3 mg/dL (ref 8.4–10.5)
Chloride: 101 mEq/L (ref 96–112)
Creatinine, Ser: 2.18 mg/dL — ABNORMAL HIGH (ref 0.40–1.50)
GFR: 30.02 mL/min — ABNORMAL LOW (ref >60.00)
Glucose, Bld: 123 mg/dL — ABNORMAL HIGH (ref 70–99)
Potassium: 3.8 mEq/L (ref 3.5–5.1)
Sodium: 142 mEq/L (ref 135–145)
Total Bilirubin: 0.7 mg/dL (ref 0.2–1.2)
Total Protein: 6.8 g/dL (ref 6.0–8.3)

## 2020-07-25 LAB — C-REACTIVE PROTEIN: CRP: 1.3 mg/dL (ref 0.5–20.0)

## 2020-07-25 LAB — SEDIMENTATION RATE: Sed Rate: 13 mm/hr (ref 0–20)

## 2020-07-25 NOTE — Assessment & Plan Note (Signed)
Aspirated right knee again secondary to the amount of swelling.  Patient given a steroid injection because we cannot do anything else for the pain relief.  Gets tramadol from primary care provider.  We discussed taking Tylenol with it.  We discussed compression, we discussed potential for MRI of the knee which patient declined.  Patient will follow up with me again 6 to 8 weeks.

## 2020-07-25 NOTE — Assessment & Plan Note (Signed)
Patient has full range of motion of both elbows.  Patient is mildly tender over the medial epicondylar region on the left side and could be more of an ulnar nerve distribution with some mild pain but no weakness.  Discussed compression.  Continue home exercises.  Right-sided have a radial head injury and some mild discomfort but full range of motion and good grip strength.  Continue with range of motion.  Patient declined physical therapy

## 2020-07-25 NOTE — Assessment & Plan Note (Signed)
Could be secondary to patient's kidney issues.  Encouraged him to follow-up with primary care provider.  No shortness of breath noted.  Vitals stable.

## 2020-07-25 NOTE — Patient Instructions (Addendum)
Follow-up with Dr. Jenny Reichmann for swelling Elbow compression sleeve Labs today Take Tylenol with Tramadol  Drained knee today but if pain continues do recommend MRI See me again in 4-6 weeks

## 2020-07-25 NOTE — Assessment & Plan Note (Signed)
Patient has not had laboratory work-up.  Patient's last GFR was 27.  We will get further evaluation.

## 2020-07-27 LAB — SYNOVIAL FLUID ANALYSIS, COMPLETE
Basophils, %: 0 %
Eosinophils-Synovial: 0 % (ref 0–2)
Lymphocytes-Synovial Fld: 25 % (ref 0–74)
Monocyte/Macrophage: 64 % (ref 0–69)
Neutrophil, Synovial: 10 % (ref 0–24)
Synoviocytes, %: 1 % (ref 0–15)
WBC, Synovial: 206 cells/uL — ABNORMAL HIGH (ref <150)

## 2020-07-27 LAB — TIQ-NTM

## 2020-07-28 ENCOUNTER — Other Ambulatory Visit: Payer: Self-pay | Admitting: *Deleted

## 2020-07-28 NOTE — Patient Outreach (Addendum)
[place] [facility]) Care Management  07/28/2020  Shenouda Genova 1950/12/08 [id number]  Telephone outreach for case closure. Pt is being transitioned to an embedded care manager, Reginia Naas, RN.  [name] has been active with this nurse since 03/2019. He was transitioned to an every 3 month call last June 2021 and then to every 6 month in September.  Pt hx includes: HTN, Cardiomyopathy, Chronic constipation, peripheral neuropathy, lumbar radiculopathy, OA, DJD of R knee, Gouty arthritis, BPH, CKD stage IV, Anemia, hyperlipidemia, dizziness to name the more chronic problems.   He is a full code and has been given advanced directives but has not ever completed them.  He had an ED visit for dizziness in December and was given meclizine. Still has some episodes but has been coached to move with intention and care especially when changing position.  He had a hernia repair last May. He had several ED visits between 03/31/19 and 09/18/19 and need some coaching for when it is appropriate to go to the ED. He has only had one ED visit since then as mentioned above.  Pt was advised today that this nurse will sign off and he will be followed by my colleague, Reginia Naas, RN.  [name] Myrtie Neither, MSN, [facility] Gerontological Nurse Practitioner [facility] Care Management [phone]

## 2020-08-07 ENCOUNTER — Ambulatory Visit (INDEPENDENT_AMBULATORY_CARE_PROVIDER_SITE_OTHER): Payer: Medicare Other | Admitting: *Deleted

## 2020-08-07 ENCOUNTER — Telehealth: Payer: Self-pay | Admitting: *Deleted

## 2020-08-07 DIAGNOSIS — I10 Essential (primary) hypertension: Secondary | ICD-10-CM

## 2020-08-07 DIAGNOSIS — N183 Chronic kidney disease, stage 3 unspecified: Secondary | ICD-10-CM | POA: Diagnosis not present

## 2020-08-07 NOTE — Telephone Encounter (Signed)
  Care Management   Outreach Note  08/07/2020 Name: [name] MRN: [id number] DOB: 07-Apr-1951  Referred by: Biagio Borg, MD Reason for referral : Care Coordination (RN CM Telephone Outreach; HTN; CKD; unsuccessful attempt)  An unsuccessful telephone outreach was attempted today. The patient was referred to the case management team for assistance with care management and care coordination and was previously active with [facility] RN CM Central team.  Follow Up Plan:  A HIPAA compliant phone message was not able to be left for the patient providing contact information and requesting a return call: patient has voice mail box that is full/ not set up yet   The care management team will reach out to the patient again over the next 14 days.   [name], RN, BSN, [facility] RN Care Coordination- [place] [phone]

## 2020-08-07 NOTE — Patient Instructions (Signed)
Visit Information  It was nice talking with you today, [name]; please listen out for a call from the care guide to discuss resources for financial strain with rent/ housing costs and for food/ grocery resources.  Be sure to read over this information and to check your blood pressure and write it down at least once a week.  I look forward to talking with you again on Tuesday September 09, 2020 at 9:00 am  [name], RN, BSN, [place] [phone]    PATIENT GOALS:  Goals Addressed            This Visit's Progress   . Manage My Diet for kidney disease   On track    Timeframe:  Long-Range Goal Priority:  Medium Start Date:          08/07/20                   Expected End Date:   02/07/21                    Follow Up Date 09/09/20   . Ask for help if I have trouble affording healthy foods . Choose foods that are low in sodium (salt) . Eat 2 servings of fruit/vegetables every day . Keep a food diary . Prepare or eat main meal at home 3 to 5 days each week . Keep healthy snacks on hand . Read food labels for sodium (salt), fat and sugar content . Watch for swelling in feet, ankles and legs every day  . Read over the information about good and bad food choices in kidney disease and start making small gradual changes to your diet . Be sure to listen out for a phone call from the care guide about resources available for food and to help with cost of rent/ housing . Talk to your doctors about whether a referral to a nutritionist would be helpful for you   Why is this important?    A healthy diet is important for mental and physical health.   Healthy food helps repair damaged body tissue and maintains strong bones and muscles.   No single food is just right so eating a variety of proteins, fruits, vegetables and grains is best.   You may need to change what you eat or drink to manage kidney disease.   A dietitian is the  best person to guide you.     Notes:     . Track and Manage My Blood Pressure-Hypertension   On track    Timeframe:  Long-Range Goal Priority:  Medium Start Date:        08/07/20                     Expected End Date:       02/07/21                Follow Up Date 09/09/20   . Check blood pressure weekly . Choose a place to take my blood pressure (home, clinic or office, retail store) . Write blood pressure results in a log or diary so we can review these together next time we talk . If you experience ongoing or recurrent symptoms of dizziness, please let your doctor know   Why is this important?    You won't feel high blood pressure, but it can still hurt your blood vessels.   High blood pressure can  cause heart or kidney problems. It can also cause a stroke.   Making lifestyle changes like losing a little weight or eating less salt will help.   Checking your blood pressure at home and at different times of the day can help to control blood pressure.   If the doctor prescribes medicine remember to take it the way the doctor ordered.   Call the office if you cannot afford the medicine or if there are questions about it.     Notes:        Food Basics for Chronic Kidney Disease Chronic kidney disease (CKD) occurs when the kidneys are permanently damaged over a long period of time. When your kidneys are not working well, they cannot remove waste, fluids, and other substances from your blood as well as they did before. The substances can build up, which can worsen kidney damage and affect how your body functions. Certain foods lead to a buildup of these substances. By changing your diet, you can help prevent more kidney damage and delay or prevent the need for dialysis. What are tips for following this plan? Reading food labels  Check the amount of salt (sodium) in foods. Choose foods that have less than 300 milligrams (mg) per serving.  Check the ingredient list for phosphorus or  potassium-based additives or preservatives.  Check the amount of saturated fat and trans fat. Limit or avoid these fats as told by your dietitian. Shopping  Avoid buying foods that are: ? Processed or prepackaged. ? Calcium-enriched or that have calcium added to them (are fortified).  Do not buy foods that have salt or sodium listed among the first five ingredients.  Buy canned vegetables and beans that say "no salt added" or "low sodium" and rinse them before eating. Cooking  Soak vegetables, such as potatoes, before cooking to reduce potassium. To do this: 1. Peel and cut the vegetables into small pieces. 2. Soak the vegetables in warm water for at least 2 hours. For every 1 cup of vegetables, use 10 cups of water. 3. Drain and rinse the vegetables with warm water. 4. Boil the vegetables for at least 5 minutes. Meal planning  Limit the amount of protein you eat from plant and animal sources each day.  Do not add salt to food when cooking or before eating.  Eat meals and snacks at around the same time each day. General information  Talk with your health care provider about whether you should take a vitamin and mineral supplement.  Use standard measuring cups and spoons to measure servings of foods. Use a kitchen scale to measure portions of protein foods.  If told by your health care provider, avoid drinking too much fluid. Measure and count all liquids, including water, ice, soups, flavored gelatin, and frozen desserts such as ice pops or ice cream. If you have diabetes:  If you have diabetes (diabetes mellitus) and CKD, it is important to keep your blood sugar (glucose) in the target range recommended by your health care provider. Follow your diabetes management plan. This may include: ? Checking your blood glucose regularly. ? Taking medicines by mouth, taking insulin, or taking both. ? Exercising for at least 30 minutes on 5 or more days each week, or as told by your health  care provider. ? Tracking how many servings of carbohydrates you eat at each meal.  You may be given specific guidelines on how much of certain foods and nutrients you may eat, depending on your stage of  kidney disease and whether you have high blood pressure (hypertension). Follow your meal plan as told by your dietitian. What nutrients should I limit? Work with your health care provider and dietitian to develop a meal plan that is right for you. Foods you can eat and foods you should limit or avoid will depend on the stage of your kidney disease and any other health conditions you have. The items listed below are not a complete list. Talk with your dietitian about what dietary choices are best for you. Potassium Potassium affects how steadily your heart beats. If too much potassium builds up in your blood, the potassium can cause an irregular heartbeat or even a heart attack. You may need to limit or avoid foods that are high in potassium, such as:  Milk and soy milk.  Fruits, such as bananas, apricots, nectarines, melon, prunes, raisins, kiwi, and oranges.  Vegetables, such as potatoes, sweet potatoes, yams, tomatoes, leafy greens, beets, avocado, pumpkin, and winter squash.  White and lima beans.  Whole-wheat breads and pastas.  Beans and nuts. Phosphorus Phosphorus is a mineral found in your bones. A balance between calcium and phosphorus is needed to build and maintain healthy bones. Too much phosphorus pulls calcium from your bones. This can make your bones weak and more likely to break. Too much phosphorus can also make your skin itch. You may need to limit or avoid foods that are high in phosphorus, such as:  Milk and dairy products.  Dried beans and peas.  Tofu, soy milk, and other soy-based meat replacements.  Dark-colored sodas.  Nuts and peanut butter.  Meat, poultry, and fish.  Bran cereals and oatmeal. Protein Protein helps you make and keep muscle. It also helps  to repair your body's cells and tissues. One of the natural breakdown products of protein is a waste product called urea. When your kidneys are not working properly, they cannot remove wastes, such as urea. Reducing how much protein you eat can help prevent a buildup of urea in your blood. Depending on your stage of kidney disease, you may need to limit foods that are high in protein. Sources of animal protein include:  Meat (all types).  Fish and seafood.  Poultry.  Eggs.  Dairy. Other protein foods include:  Beans and legumes.  Nuts and nut butter.  Soy and tofu.   Sodium Sodium helps to maintain a healthy balance of fluids in your body. Too much sodium can increase your blood pressure and have a negative effect on your heart and lungs. Too much sodium can also cause your body to retain too much fluid, making your kidneys work harder. Most people should have less than 2,300 mg of sodium each day. If you have hypertension, you may need to limit your sodium to 1,500 mg each day. You may need to limit or avoid foods that are high in sodium, such as:  Salt seasonings.  Soy sauce.  Cured and processed meats.  Salted crackers and snack foods.  Fast food.  Canned soups and most canned foods.  Pickled foods.  Vegetable juice.  Boxed mixes or ready-to-eat boxed meals and side dishes.  Bottled dressings, sauces, and marinades. Talk with your dietitian about how much potassium, phosphorus, protein, and sodium you may have each day. Summary  Chronic kidney disease (CKD) can lead to a buildup of waste and extra substances in the body. Certain foods lead to a buildup of these substances. By changing your diet as told, you can help  prevent more kidney damage and delay or prevent the need for dialysis.  Food intake changes are different for each person with CKD. Work with a dietitian to set up nutrient goals and a meal plan that is right for you.  If you have diabetes and CKD, it  is important to keep your blood sugar in the target range recommended by your health care provider. This information is not intended to replace advice given to you by your health care provider. Make sure you discuss any questions you have with your health care provider. Document Revised: 09/17/2019 Document Reviewed: 09/17/2019 Elsevier Patient Education  2021 [employer].   [name] was given information about Care Management services today including:  1. Care Management services include personalized support from designated clinical staff supervised by his physician, including individualized plan of care and coordination with other care providers 2. 24/7 contact phone numbers for assistance for urgent and routine care needs. 3. The patient may stop CCM services at any time (effective at the end of the month) by phone call to the office staff.  Patient agreed to services and verbal consent obtained.   The patient verbalized understanding of instructions, educational materials, and care plan provided today and agreed to receive a mailed copy of patient instructions, educational materials, and care plan.    Telephone follow up appointment with care management team member scheduled for: Tuesday September 09, 2020 at 9:00 am  The patient has been provided with contact information for the care management team and has been advised to call with any health related questions or concerns.   [name], RN, BSN, [facility] RN Care Coordination- [place] [phone]

## 2020-08-07 NOTE — Chronic Care Management (AMB) (Signed)
Care Management    RN Visit Note  08/07/2020 Name: [name] MRN: [id number] DOB: Jun 11, 1950  Subjective: [name] is a 70 y.o. year old male who is a primary care patient of Donald Borg, MD. The care management team was consulted for assistance with disease management and care coordination needs.    Engaged with patient by telephone for initial visit in response to provider referral for case management and/or care coordination services.   Consent to Services:   [name] was given information about Care Management services today including:  1. Care Management services includes personalized support from designated clinical staff supervised by his physician, including individualized plan of care and coordination with other care providers 2. 24/7 contact phone numbers for assistance for urgent and routine care needs. 3. The patient may stop case management services at any time by phone call to the office staff.  Patient was previously active with [facility] RN CM/ Central agreed to services and consent obtained.   Assessment: Review of patient past medical history, allergies, medications, health status, including review of consultants reports, laboratory and other test data, was performed as part of comprehensive evaluation and provision of chronic care management services.   SDOH (Social Determinants of Health) assessments and interventions performed:  SDOH Interventions   Flowsheet Row Most Recent Value  SDOH Interventions   Food Insecurity Interventions Other (Comment)  [Care Guide referral placed]  Financial Strain Interventions Other (Comment)  [Care Guide referral placed]  Transportation Interventions Intervention Not Indicated     Care Plan No Known Allergies  Outpatient Encounter Medications as of 08/07/2020  Medication Sig Note  . acetaminophen (TYLENOL) 500 MG tablet Take 1,000 mg by mouth every 6 (six) hours as needed for moderate pain.    Marland Kitchen allopurinol (ZYLOPRIM) 100  MG tablet Take by mouth See admin instructions. Take 2 tablets by mouth daily   . amLODipine (NORVASC) 10 MG tablet Take 10 mg by mouth 2 (two) times daily. 08/07/2020: 08/07/20- patient reports taking QD (not BID)  . aspirin EC 81 MG tablet Take 81 mg by mouth daily.   Marland Kitchen atorvastatin (LIPITOR) 40 MG tablet Take 40 mg by mouth daily.   . cholecalciferol (VITAMIN D3) 25 MCG (1000 UNIT) tablet Take 1,000 Units by mouth daily.   . colchicine 0.6 MG tablet Take 1 tablet by mouth once daily 08/07/2020: 08/07/20: Patient reports takes prn  . ferrous sulfate 325 (65 FE) MG tablet Take 325 mg by mouth daily with breakfast.   . hydroxypropyl methylcellulose / hypromellose (ISOPTO TEARS / GONIOVISC) 2.5 % ophthalmic solution Place 1-2 drops into both eyes 3 (three) times daily as needed (dry/irritated eyes.). 08/07/2020: 08/07/20: Reports uses prn  . lactulose (CHRONULAC) 10 GM/15ML solution Take 30 mLs (20 g total) by mouth daily.   . meclizine (ANTIVERT) 25 MG tablet Take 1 tablet (25 mg total) by mouth 2 (two) times daily as needed for up to 20 doses for dizziness. 08/07/2020: 08/07/20: Patient reports taking prn; has not needed recently  . metoprolol tartrate (LOPRESSOR) 50 MG tablet Take 1 tablet (50 mg total) by mouth 2 (two) times daily.   . Multiple Vitamin (MULTIVITAMIN WITH MINERALS) TABS tablet Take 1 tablet by mouth daily.   . Potassium 99 MG TABS Take 99 mg by mouth daily.    . traMADol (ULTRAM) 50 MG tablet Take 1 tablet (50 mg total) by mouth every 6 (six) hours as needed. 08/07/2020: 08/07/20: Patient reports taking prn with tylenol; reports takes  several times per week, last dose "about 3 days ago"  . linaclotide (LINZESS) 145 MCG CAPS capsule Take 1 capsule (145 mcg total) by mouth daily before breakfast. (Patient not taking: Reported on 08/07/2020)   . MYRBETRIQ 25 MG TB24 tablet Take 25 mg by mouth daily. (Patient not taking: Reported on 08/07/2020) 08/07/2020: 08/07/20: Patient reports he is not currently taking    No facility-administered encounter medications on file as of 08/07/2020.    Patient Active Problem List   Diagnosis Date Noted  . Bilateral elbow joint pain 07/09/2020  . Acute renal failure superimposed on chronic kidney disease ([place]) 11/12/2019  . Dry eyes 11/12/2019  . S/P hernia repair 10/17/2019  . Abdominal pain 07/06/2019  . BPH with obstruction/lower urinary tract symptoms 03/09/2019  . Clot retention of urine 02/11/2019  . Vitamin D deficiency 09/13/2017  . Urinary tract infection without hematuria 07/23/2017  . Low back pain 07/03/2017  . Dizziness 06/30/2017  . Acute gouty arthritis 12/01/2016  . Degenerative arthritis of knee, bilateral 11/18/2016  . Urinary retention due to benign prostatic hyperplasia 10/24/2016  . Mass of right side of neck 10/20/2016  . Gout 10/20/2016  . Right knee pain 10/12/2016  . Hypokalemia 05/28/2016  . Encounter for well adult exam with abnormal findings 11/27/2015  . Obstructive uropathy 11/27/2015  . Elevated PSA 11/27/2015  . Acute pyelonephritis 05/27/2015  . Generalized bloating 05/27/2015  . Chronic constipation 05/27/2015  . Chest pain 05/27/2015  . AKI (acute kidney injury) ([place]) 05/27/2015  . Acute sinus infection 11/22/2014  . Cough 09/25/2014  . Hemorrhoids, internal, with bleeding, prolapse 09/19/2014  . 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 Hyperlipidemia 05/29/2014  . Lumbar radiculopathy 05/23/2014  . Lumbar stenosis 11/20/2013  . Abnormal glucose 11/06/2013  . Unspecified hereditary and idiopathic peripheral neuropathy 01/22/2013  . Cardiomyopathy due to hypertension ([place]) 12/28/2011  . CKD (chronic kidney disease) stage 3, GFR 30-59 ml/min ([facility]) 11/24/2011  . Hematuria 11/24/2011  . Hydronephrosis 11/24/2011  . Anemia 11/24/2011  . BRADYCARDIA 02/05/2010  . TINEA PEDIS 01/29/2010  . 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 PERIPHERAL EDEMA 01/29/2010  . ABNORMAL ELECTROCARDIOGRAM 01/29/2010  . POSITIVE PPD  01/29/2010  . Osteoarthrosis, generalized, 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  . ONYCHOMYCOSIS, TOENAILS 10/02/2007  . BPH (benign prostatic hyperplasia) 10/02/2007  . Essential hypertension 03/06/2007    Conditions to be addressed/monitored: HTN and CKD Stage III  Care Plan : Chronic Kidney (Adult)  Updates made by Knox Royalty, RN since 08/07/2020 12:00 AM    Problem: Disease Progression   Priority: Medium    Long-Range Goal: Disease Progression Prevented or Minimized   Start Date: 08/07/2020  Expected End Date: 02/07/2021  This Visit's Progress: On track  Priority: Medium  Note:   Current Barriers:  Marland Kitchen Knowledge Deficits related to dietary management of CKD . Care Coordination needs related to resource needs for rent/housing costs and food acquisition in a patient with CKD . Chronic Disease Management support and education needs related to self-health management of CKD . Unable to independently verbalize strategies for dietary management of CKD Nurse Case Manager Clinical Goal(s):  Over the next 6 months: . patient will work with RN CM to address needs related to education and information for self-health management of CKD . patient will verbalize basic understanding of CKD disease process and self health management plan as evidenced by patient verbalization of same, including appropriate foods to eat and avoid in in setting of CKD . patient will work with care  guide to obtain resources for food acquisition and rent/ housing costs Interventions:  . 1:1 collaboration with Donald Borg, MD regarding development and update of comprehensive plan of care as evidenced by provider attestation and co-signature . Inter-disciplinary care team collaboration (see longitudinal plan of care) . Chart reviewed including relevant office notes, upcoming scheduled appointments, and lab results . Evaluation of current treatment plan related to CKD and patient's adherence to plan as established by  provider . Discussed current  clinical condition with patient and confirmed no current clinical or medication concerns; confirmed patient continues to manage medications independently at home and reports adherence to medication regimen . Reviewed medications with patient and discussed need to review medications with care providers regularly; encouraged patient to write down questions about medications to discuss with care providers at scheduled appointments . Provided education to patient re: appropriate foods to eat and to avoid in setting of CKD . Provided patient with community resource information around food acquisition and rent/ housing costs: Care Guide referral placed . Reviewed scheduled/upcoming provider appointments including: PCP OV for AWE 08/08/20; Orthopedic provider 08/20/20; GI provider 09/03/20: confirmed patient has plans to attend all as scheduled . Discussed plans with patient for ongoing care management follow up and provided patient with direct contact information for care management team Patient Goals/Self-Care Activities . Ask for help if I have trouble affording healthy foods . Choose foods that are low in sodium (salt) . Eat 2 servings of fruit/vegetables every day . Keep a food diary . Prepare or eat main meal at home 3 to 5 days each week . Keep healthy snacks on hand . Read food labels for sodium (salt), fat and sugar content . Watch for swelling in feet, ankles and legs every day  . Read over the information about good and bad food choices in kidney disease and start making small gradual changes to your diet . Be sure to listen out for a phone call from the care guide about resources available for food and to help with cost of rent/ housing . Talk to your doctors about whether a referral to a nutritionist would be helpful for you Follow Up Plan:  Telephone follow up appointment with care management team member scheduled for: Tuesday, September 09, 2020 at 9:00 am The  patient has been provided with contact information for the care management team and has been advised to call with any health related questions or concerns.        Care Plan : Hypertension (Adult)  Updates made by Knox Royalty, RN since 08/07/2020 12:00 AM    Problem: Hypertension (Hypertension)   Priority: Medium    Long-Range Goal: Hypertension Monitored   Start Date: 08/07/2020  Expected End Date: 02/07/2021  This Visit's Progress: Not on track  Priority: Medium  Note:   Objective:  . Last practice recorded BP readings:  BP Readings from Last 3 Encounters:  07/25/20 122/82  07/23/20 (!) 144/76  07/09/20 122/72   . Most recent eGFR/CrCl: No results found for: EGFR  No components found for: CRCL Current Barriers:  Marland Kitchen Knowledge Deficits related to basic understanding of hypertension pathophysiology in setting of CKD- could benefit from ongoing reinforcement . Financial Constraints- care guide referral placed for resources around food acquisition and housing/ rent costs  . Unable to independently verbalize blood pressure readings at home; does not monitor blood pressures Case Manager Clinical Goal(s):  Marland Kitchen Over the next 6 months, patient will demonstrate improved adherence to prescribed treatment plan  for hypertension as evidenced by taking all medications as prescribed, monitoring and recording blood pressure weekly, adhering to low sodium/DASH diet Interventions:  . Collaboration with Donald Borg, MD regarding development and update of comprehensive plan of care as evidenced by provider attestation and co-signature . Inter-disciplinary care team collaboration (see longitudinal plan of care) . UNABLE to independently: verbalize role of blood pressure management in setting of CKD progression . Provided education to patient re: DASH diet, complications of uncontrolled blood pressure, role blood pressure plays in management of CKD . Reviewed blood pressure medications with patient and  discussed importance of compliance: confirmed patient has been experiencing less dizziness: taking amlodipine QD and metoprolol BID . Discussed plans with patient for ongoing care management follow up and provided patient with direct contact information for care management team . Advised patient, providing education and rationale, to monitor blood pressure weekly and record, calling PCP for findings outside established parameters  Patient Goals: . Check blood pressure weekly . Choose a place to take my blood pressure (home, clinic or office, retail store) . Write blood pressure results in a log or diary so we can review these together next time we talk . If you experience ongoing or recurrent symptoms of dizziness, please let your doctor know Self-Care Activities: Attends all scheduled provider appointments Calls provider office for new concerns and questions Attempts to follow a low sodium diet/DASH diet Follow Up Plan:  . Telephone follow up appointment with care management team member scheduled for: Tuesday September 09, 2020 at 9:00 am . The patient has been provided with contact information for the care management team and has been advised to call with any health related questions or concerns.       Plan:   Telephone follow up appointment with care management team member scheduled for:  Tuesday 09/09/20 at 9:00 am   The patient has been provided with contact information for the care management team and has been advised to call with any health related questions or concerns.   [name], RN, BSN, [facility] RN Care Coordination- [place] [phone]

## 2020-08-08 ENCOUNTER — Other Ambulatory Visit: Payer: Self-pay

## 2020-08-08 ENCOUNTER — Ambulatory Visit (INDEPENDENT_AMBULATORY_CARE_PROVIDER_SITE_OTHER): Payer: Medicare Other

## 2020-08-08 VITALS — BP 132/80 | HR 55 | Temp 98.1°F | Resp 16 | Ht 73.0 in | Wt 266.8 lb

## 2020-08-08 DIAGNOSIS — Z0000 Encounter for general adult medical examination without abnormal findings: Secondary | ICD-10-CM | POA: Diagnosis not present

## 2020-08-08 NOTE — Patient Instructions (Signed)
[name] , Thank you for taking time to come for your Medicare Wellness Visit. I appreciate your ongoing commitment to your health goals. Please review the following plan we discussed and let me know if I can assist you in the future.   Screening recommendations/referrals: Colonoscopy: 08/22/2014; due every 10 years (08/2024) Recommended yearly ophthalmology/optometry visit for glaucoma screening and checkup Recommended yearly dental visit for hygiene and checkup  Vaccinations: Influenza vaccine: 05/12/2020 Pneumococcal vaccine: 11/27/2015, 10/20/2016 Tdap vaccine: 12/04/2013; due every 10 years  Shingles vaccine: never done   Covid-19: 08/03/2019, 08/28/2019  Advanced directives: Advance directive discussed with you today. Even though you declined this today please call our office should you change your mind and we can give you the proper paperwork for you to fill out.  Conditions/risks identified: Yes; Reviewed health maintenance screenings with patient today and relevant education, vaccines, and/or referrals were provided. Please continue to do your personal lifestyle choices by: daily care of teeth and gums, regular physical activity (goal should be 5 days a week for 30 minutes), eat a healthy diet, avoid tobacco and drug use, limiting any alcohol intake, taking a low-dose aspirin (if not allergic or have been advised by your provider otherwise) and taking vitamins and minerals as recommended by your provider. Continue doing brain stimulating activities (puzzles, reading, adult coloring books, staying active) to keep memory sharp. Continue to eat heart healthy diet (full of fruits, vegetables, whole grains, lean protein, water--limit salt, fat, and sugar intake) and increase physical activity as tolerated.  Next appointment: Please schedule your next Medicare Wellness Visit with your Nurse Health Advisor in 1 year by calling [phone].  Preventive Care 70 Years and Older, Male Preventive care  refers to lifestyle choices and visits with your health care provider that can promote health and wellness. What does preventive care include?  A yearly physical exam. This is also called an annual well check.  Dental exams once or twice a year.  Routine eye exams. Ask your health care provider how often you should have your eyes checked.  Personal lifestyle choices, including:  Daily care of your teeth and gums.  Regular physical activity.  Eating a healthy diet.  Avoiding tobacco and drug use.  Limiting alcohol use.  Practicing safe sex.  Taking low doses of aspirin every day.  Taking vitamin and mineral supplements as recommended by your health care provider. What happens during an annual well check? The services and screenings done by your health care provider during your annual well check will depend on your age, overall health, lifestyle risk factors, and family history of disease. Counseling  Your health care provider may ask you questions about your:  Alcohol use.  Tobacco use.  Drug use.  Emotional well-being.  Home and relationship well-being.  Sexual activity.  Eating habits.  History of falls.  Memory and ability to understand (cognition).  Work and work Statistician. Screening  You may have the following tests or measurements:  Height, weight, and BMI.  Blood pressure.  Lipid and cholesterol levels. These may be checked every 5 years, or more frequently if you are over 40 years old.  Skin check.  Lung cancer screening. You may have this screening every year starting at age 37 if you have a 30-pack-year history of smoking and currently smoke or have quit within the past 15 years.  Fecal occult blood test (FOBT) of the stool. You may have this test every year starting at age 73.  Flexible sigmoidoscopy or colonoscopy.  You may have a sigmoidoscopy every 5 years or a colonoscopy every 10 years starting at age 77.  Prostate cancer screening.  Recommendations will vary depending on your family history and other risks.  Hepatitis C blood test.  Hepatitis B blood test.  Sexually transmitted disease (STD) testing.  Diabetes screening. This is done by checking your blood sugar (glucose) after you have not eaten for a while (fasting). You may have this done every 1-3 years.  Abdominal aortic aneurysm (AAA) screening. You may need this if you are a current or former smoker.  Osteoporosis. You may be screened starting at age 36 if you are at high risk. Talk with your health care provider about your test results, treatment options, and if necessary, the need for more tests. Vaccines  Your health care provider may recommend certain vaccines, such as:  Influenza vaccine. This is recommended every year.  Tetanus, diphtheria, and acellular pertussis (Tdap, Td) vaccine. You may need a Td booster every 10 years.  Zoster vaccine. You may need this after age 68.  Pneumococcal 13-valent conjugate (PCV13) vaccine. One dose is recommended after age 54.  Pneumococcal polysaccharide (PPSV23) vaccine. One dose is recommended after age 38. Talk to your health care provider about which screenings and vaccines you need and how often you need them. This information is not intended to replace advice given to you by your health care provider. Make sure you discuss any questions you have with your health care provider. Document Released: 06/20/2015 Document Revised: 02/11/2016 Document Reviewed: 03/25/2015 Elsevier Interactive Patient Education  2017 [place] Prevention in the Home Falls can cause injuries. They can happen to people of all ages. There are many things you can do to make your home safe and to help prevent falls. What can I do on the outside of my home?  Regularly fix the edges of walkways and driveways and fix any cracks.  Remove anything that might make you trip as you walk through a door, such as a raised step or  threshold.  Trim any bushes or trees on the path to your home.  Use bright outdoor lighting.  Clear any walking paths of anything that might make someone trip, such as rocks or tools.  Regularly check to see if handrails are loose or broken. Make sure that both sides of any steps have handrails.  Any raised decks and porches should have guardrails on the edges.  Have any leaves, snow, or ice cleared regularly.  Use sand or salt on walking paths during winter.  Clean up any spills in your garage right away. This includes oil or grease spills. What can I do in the bathroom?  Use night lights.  Install grab bars by the toilet and in the tub and shower. Do not use towel bars as grab bars.  Use non-skid mats or decals in the tub or shower.  If you need to sit down in the shower, use a plastic, non-slip stool.  Keep the floor dry. Clean up any water that spills on the floor as soon as it happens.  Remove soap buildup in the tub or shower regularly.  Attach bath mats securely with double-sided non-slip rug tape.  Do not have throw rugs and other things on the floor that can make you trip. What can I do in the bedroom?  Use night lights.  Make sure that you have a light by your bed that is easy to reach.  Do not use any sheets  or blankets that are too big for your bed. They should not hang down onto the floor.  Have a firm chair that has side arms. You can use this for support while you get dressed.  Do not have throw rugs and other things on the floor that can make you trip. What can I do in the kitchen?  Clean up any spills right away.  Avoid walking on wet floors.  Keep items that you use a lot in easy-to-reach places.  If you need to reach something above you, use a strong step stool that has a grab bar.  Keep electrical cords out of the way.  Do not use floor polish or wax that makes floors slippery. If you must use wax, use non-skid floor wax.  Do not have  throw rugs and other things on the floor that can make you trip. What can I do with my stairs?  Do not leave any items on the stairs.  Make sure that there are handrails on both sides of the stairs and use them. Fix handrails that are broken or loose. Make sure that handrails are as long as the stairways.  Check any carpeting to make sure that it is firmly attached to the stairs. Fix any carpet that is loose or worn.  Avoid having throw rugs at the top or bottom of the stairs. If you do have throw rugs, attach them to the floor with carpet tape.  Make sure that you have a light switch at the top of the stairs and the bottom of the stairs. If you do not have them, ask someone to add them for you. What else can I do to help prevent falls?  Wear shoes that:  Do not have high heels.  Have rubber bottoms.  Are comfortable and fit you well.  Are closed at the toe. Do not wear sandals.  If you use a stepladder:  Make sure that it is fully opened. Do not climb a closed stepladder.  Make sure that both sides of the stepladder are locked into place.  Ask someone to hold it for you, if possible.  Clearly mark and make sure that you can see:  Any grab bars or handrails.  First and last steps.  Where the edge of each step is.  Use tools that help you move around (mobility aids) if they are needed. These include:  Canes.  Walkers.  Scooters.  Crutches.  Turn on the lights when you go into a dark area. Replace any light bulbs as soon as they burn out.  Set up your furniture so you have a clear path. Avoid moving your furniture around.  If any of your floors are uneven, fix them.  If there are any pets around you, be aware of where they are.  Review your medicines with your doctor. Some medicines can make you feel dizzy. This can increase your chance of falling. Ask your doctor what other things that you can do to help prevent falls. This information is not intended to  replace advice given to you by your health care provider. Make sure you discuss any questions you have with your health care provider. Document Released: 03/20/2009 Document Revised: 10/30/2015 Document Reviewed: 06/28/2014 Elsevier Interactive Patient Education  2017 [employer].

## 2020-08-08 NOTE — Progress Notes (Signed)
Subjective:   [name] is a 70 y.o. male who presents for Medicare Annual/Subsequent preventive examination.  Review of Systems    No ROS. Medicare Wellness Visit. Additional risk factors are reflected in social history. Cardiac Risk Factors include: advanced age (>26men, >66 women);dyslipidemia;hypertension;male gender;obesity (BMI >30kg/m2)     Objective:    Today's Vitals   08/08/20 1020  BP: 132/80  Pulse: (!) 55  Resp: 16  Temp: 98.1 F (36.7 C)  SpO2: 97%  Weight: 266 lb 12.8 oz (121 kg)  Height: 6\' 1"  (1.854 m)  PainSc: 7    Body mass index is 35.2 kg/m.  Advanced Directives 08/08/2020 10/18/2019 10/17/2019 10/12/2019 09/18/2019 09/06/2019 04/04/2019  Does Patient Have a Medical Advance Directive? No No No No No No No  Does patient want to make changes to medical advance directive? - - - - - - -  Would patient like information on creating a medical advance directive? No - Patient declined No - Patient declined - Yes (MAU/Ambulatory/Procedural Areas - Information given) No - Patient declined - Yes (ED - Information included in AVS)  Pre-existing out of facility DNR order (yellow form or pink MOST form) - - - - - - -    Current Medications (verified) Outpatient Encounter Medications as of 08/08/2020  Medication Sig  . acetaminophen (TYLENOL) 500 MG tablet Take 1,000 mg by mouth every 6 (six) hours as needed for moderate pain.   Marland Kitchen allopurinol (ZYLOPRIM) 100 MG tablet Take by mouth See admin instructions. Take 2 tablets by mouth daily  . amLODipine (NORVASC) 10 MG tablet Take 10 mg by mouth 2 (two) times daily.  Marland Kitchen aspirin EC 81 MG tablet Take 81 mg by mouth daily.  Marland Kitchen atorvastatin (LIPITOR) 40 MG tablet Take 40 mg by mouth daily.  . cholecalciferol (VITAMIN D3) 25 MCG (1000 UNIT) tablet Take 1,000 Units by mouth daily.  . colchicine 0.6 MG tablet Take 1 tablet by mouth once daily  . ferrous sulfate 325 (65 FE) MG tablet Take 325 mg by mouth daily with breakfast.  .  hydroxypropyl methylcellulose / hypromellose (ISOPTO TEARS / GONIOVISC) 2.5 % ophthalmic solution Place 1-2 drops into both eyes 3 (three) times daily as needed (dry/irritated eyes.).  Marland Kitchen lactulose (CHRONULAC) 10 GM/15ML solution Take 30 mLs (20 g total) by mouth daily.  Marland Kitchen linaclotide (LINZESS) 145 MCG CAPS capsule Take 1 capsule (145 mcg total) by mouth daily before breakfast. (Patient not taking: Reported on 08/07/2020)  . meclizine (ANTIVERT) 25 MG tablet Take 1 tablet (25 mg total) by mouth 2 (two) times daily as needed for up to 20 doses for dizziness.  . metoprolol tartrate (LOPRESSOR) 50 MG tablet Take 1 tablet (50 mg total) by mouth 2 (two) times daily.  . Multiple Vitamin (MULTIVITAMIN WITH MINERALS) TABS tablet Take 1 tablet by mouth daily.  Marland Kitchen MYRBETRIQ 25 MG TB24 tablet Take 25 mg by mouth daily. (Patient not taking: Reported on 08/07/2020)  . Potassium 99 MG TABS Take 99 mg by mouth daily.   . traMADol (ULTRAM) 50 MG tablet Take 1 tablet (50 mg total) by mouth every 6 (six) hours as needed.   No facility-administered encounter medications on file as of 08/08/2020.    Allergies (verified) Patient has no known allergies.   History: Past Medical History:  Diagnosis Date  . Anemia    normal Fe, nl B12, nl retic, nl EPO July '13  . Aortic regurgitation    Moderate AI 04/17/19 echo  .  Blood transfusion without reported diagnosis   . BPH (benign prostatic hyperplasia)   . Chronic back pain   . Chronic kidney disease    CKD III, obstructive nephropathy  . Diverticulosis   . Dysuria   . Elevated PSA, greater than or equal to 20 ng/ml June '13   PSA 107  . Hemorrhoids, internal, with bleeding, prolapse 09/19/2014  . Hyperlipidemia 05/29/2014  . Hypertension   . Obstructive uropathy 11/27/2015  . Pre-diabetes    per patient "reduced sugar intake"  . Renal insufficiency   . Tuberculosis    h/o PPD +  . UTI (urinary tract infection)   . Vitamin D deficiency 09/13/2017   Past Surgical  History:  Procedure Laterality Date  . CARPAL TUNNEL RELEASE Right   . COLONOSCOPY    . HEMORRHOID BANDING    . INSERTION OF MESH N/A 10/17/2019   Procedure: Insertion Of Mesh;  Surgeon: Ralene Ok, MD;  Location: [facility];  Service: General;  Laterality: N/A;  . SPLENECTOMY    . XI ROBOTIC ASSISTED SIMPLE PROSTATECTOMY N/A 03/09/2019   Procedure: XI ROBOTIC ASSISTED SIMPLE PROSTATECTOMY;  Surgeon: Cleon Gustin, MD;  Location: [facility];  Service: Urology;  Laterality: N/A;  . XI ROBOTIC ASSISTED VENTRAL HERNIA N/A 10/17/2019   Procedure: XI ROBOTIC ASSISTED INCISIONAL HERNIA REPAIR WITH MESH;  Surgeon: Ralene Ok, MD;  Location: [facility];  Service: General;  Laterality: N/A;   Family History  Problem Relation Age of Onset  . Diabetes Mother   . Stroke Brother   . Hearing loss Brother   . Colon cancer Neg Hx   . Rectal cancer Neg Hx   . Stomach cancer Neg Hx   . Esophageal cancer Neg Hx    Social History   Socioeconomic History  . Marital status: Single    Spouse name: Not on file  . Number of children: 2  . Years of education: 61  . Highest education level: Some college, no degree  Occupational History  . Occupation: Lobbyist: [employer]    Comment: unemployed  Tobacco Use  . Smoking status: Former Smoker    Types: Cigarettes    Quit date: 02/26/1980    Years since quitting: 40.4  . Smokeless tobacco: Never Used  Vaping Use  . Vaping Use: Never used  Substance and Sexual Activity  . Alcohol use: No    Alcohol/week: 0.0 standard drinks  . Drug use: No  . Sexual activity: Not Currently  Other Topics Concern  . Not on file  Social History Narrative   Native of [place], raised poor farming community. . To [place] '78 - finished College, all but Arts administrator. Married - wife in [place], blocked from [place] to [place]. 1 son '90; 1 dtr '93. 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 Resources depleted.   Right handed.    Caffeine Hot daily one daily.   Social Determinants of Health   Financial Resource Strain: High Risk  . Difficulty of Paying Living Expenses: Hard  Food Insecurity: Food Insecurity Present  . Worried About Charity fundraiser in the Last Year: Sometimes true  . Ran Out of Food in the Last Year: Sometimes true  Transportation Needs: No Transportation Needs  . Lack of Transportation (Medical): No  . Lack of Transportation (Non-Medical): No  Physical Activity: Inactive  . Days of Exercise per Week: 0 days  . Minutes of Exercise per Session: 0 min  Stress:  No Stress Concern Present  . Feeling of Stress : Not at all  Social Connections: Moderately Isolated  . Frequency of Communication with Friends and Family: More than three times a week  . Frequency of Social Gatherings with Friends and Family: Once a week  . Attends Religious Services: Never  . Active Member of Clubs or Organizations: No  . Attends Archivist Meetings: Never  . Marital Status: Married    Tobacco Counseling Counseling given: Not Answered   Clinical Intake:  Pre-visit preparation completed: Yes  Pain : 0-10 Pain Score: 7  Pain Type: Chronic pain Pain Location: Back Pain Orientation: Lower Pain Radiating Towards: right knee Pain Descriptors / Indicators: Constant,Discomfort,Dull,Nagging,Other (Comment) (generalized) Pain Onset: More than a month ago Pain Frequency: Constant Pain Relieving Factors: Tylenol & Tramadol Effect of Pain on Daily Activities: Pain can diminish job performance, lower motivation to exercise, and prevent you from completing daily tasks. Pain produces disability and affects the quality of life.  Pain Relieving Factors: Tylenol & Tramadol  BMI - recorded: 35.2 Nutritional Status: BMI > 30  Obese Nutritional Risks: None Diabetes: No  How often do you need to have someone help you when you read instructions, pamphlets, or other written materials from your doctor or  pharmacy?: 1 - Never What is the last grade level you completed in school?: Master's Degree; Retired [employer] Professor  Diabetic? no  Interpreter Needed?: No  Information entered by :: Lisette Abu, LPN   Activities of Daily Living In your present state of health, do you have any difficulty performing the following activities: 08/08/2020 10/18/2019  Hearing? N -  Vision? N -  Difficulty concentrating or making decisions? N -  Walking or climbing stairs? Y -  Dressing or bathing? N -  Doing errands, shopping? N Y  Conservation officer, nature and eating ? N -  Using the Toilet? N -  In the past six months, have you accidently leaked urine? N -  Do you have problems with loss of bowel control? N -  Managing your Medications? N -  Managing your Finances? N -  Housekeeping or managing your Housekeeping? N -  Some recent data might be hidden    Patient Care Team: Biagio Borg, MD as PCP - General (Internal Medicine) Nahser, Wonda Cheng, MD as PCP - Cardiology (Cardiology) Jana Half, DPM as Consulting Physician (Podiatry) Pilar Jarvis Horald Pollen, MD as Consulting Physician (Urology) Knox Royalty, RN as Case Manager  Indicate any recent Medical Services you may have received from other than [facility] providers in the past year (date may be approximate).     Assessment:   This is a routine wellness examination for Jylan.  Hearing/Vision screen No exam data present  Dietary issues and exercise activities discussed: Current Exercise Habits: The patient does not participate in regular exercise at present, Exercise limited by: neurologic condition(s);orthopedic condition(s)  Goals    . Manage My Diet for kidney disease     Timeframe:  Long-Range Goal Priority:  Medium Start Date:          08/07/20                   Expected End Date:   02/07/21                    Follow Up Date 09/09/20   . Ask for help if I have trouble affording healthy foods . Choose foods that are low in  sodium (  salt) . Eat 2 servings of fruit/vegetables every day . Keep a food diary . Prepare or eat main meal at home 3 to 5 days each week . Keep healthy snacks on hand . Read food labels for sodium (salt), fat and sugar content . Watch for swelling in feet, ankles and legs every day  . Read over the information about good and bad food choices in kidney disease and start making small gradual changes to your diet . Be sure to listen out for a phone call from the care guide about resources available for food and to help with cost of rent/ housing . Talk to your doctors about whether a referral to a nutritionist would be helpful for you   Why is this important?    A healthy diet is important for mental and physical health.   Healthy food helps repair damaged body tissue and maintains strong bones and muscles.   No single food is just right so eating a variety of proteins, fruits, vegetables and grains is best.   You may need to change what you eat or drink to manage kidney disease.   A dietitian is the best person to guide you.     Notes:     . Track and Manage My Blood Pressure-Hypertension     Timeframe:  Long-Range Goal Priority:  Medium Start Date:        08/07/20                     Expected End Date:       02/07/21                Follow Up Date 09/09/20   . Check blood pressure weekly . Choose a place to take my blood pressure (home, clinic or office, retail store) . Write blood pressure results in a log or diary so we can review these together next time we talk . If you experience ongoing or recurrent symptoms of dizziness, please let your doctor know   Why is this important?    You won't feel high blood pressure, but it can still hurt your blood vessels.   High blood pressure can cause heart or kidney problems. It can also cause a stroke.   Making lifestyle changes like losing a little weight or eating less salt will help.   Checking your blood pressure at home and at  different times of the day can help to control blood pressure.   If the doctor prescribes medicine remember to take it the way the doctor ordered.   Call the office if you cannot afford the medicine or if there are questions about it.     Notes:       Depression Screen PHQ 2/9 Scores 08/08/2020 08/07/2020 07/06/2019 07/06/2019 04/02/2019 03/20/2019 07/04/2018  PHQ - 2 Score 0 0 0 0 0 0 0    Fall Risk Fall Risk  08/07/2020 07/06/2019 07/06/2019 03/20/2019 02/26/2019  Falls in the past year? 1 0 0 1 1  Number falls in past yr: 0 - - 0 0  Injury with Fall? 1 - - 0 0  Comment reports minor soreness/ pain in elbow - - - -  Risk for fall due to : Medication side effect;Impaired mobility - - History of fall(s);Impaired balance/gait;Medication side effect;Impaired mobility History of fall(s)  Risk for fall due to: Comment knee pain; takes tramadol - - - -  Follow up Falls prevention discussed - - Falls evaluation completed;Education  provided;Falls prevention discussed Falls prevention discussed    FALL RISK PREVENTION PERTAINING TO THE HOME:  Any stairs in or around the home? Yes  If so, are there any without handrails? No  Home free of loose throw rugs in walkways, pet beds, electrical cords, etc? Yes  Adequate lighting in your home to reduce risk of falls? Yes   ASSISTIVE DEVICES UTILIZED TO PREVENT FALLS:  Life alert? No  Use of a cane, walker or w/c? Yes  Grab bars in the bathroom? No  Shower chair or bench in shower? No  Elevated toilet seat or a handicapped toilet? No   TIMED UP AND GO:  Was the test performed? No .  Length of time to ambulate 10 feet: 0 sec.   Gait steady and fast with assistive device  Cognitive Function: Normal cognitive status assessed by direct observation by this Nurse Health Advisor. No abnormalities found.          Immunizations Immunization History  Administered Date(s) Administered  . Fluad Quad(high Dose 65+) 02/21/2019, 05/12/2020  . Influenza  Whole 06/09/1999, 05/18/2007, 03/30/2010  . Influenza, High Dose Seasonal PF 04/08/2016, 03/01/2017, 03/24/2018  . Influenza,inj,Quad PF,6+ Mos 03/13/2015  . Influenza-Unspecified 05/07/2014  . PFIZER(Purple Top)SARS-COV-2 Vaccination 08/03/2019, 08/28/2019  . Pneumococcal Conjugate-13 11/27/2015  . Pneumococcal Polysaccharide-23 10/20/2016  . Tdap 12/04/2013    TDAP status: Up to date  Flu Vaccine status: Up to date  Pneumococcal vaccine status: Up to date  Covid-19 vaccine status: Completed vaccines  Qualifies for Shingles Vaccine? Yes   Zostavax completed No   Shingrix Completed?: No.    Education has been provided regarding the importance of this vaccine. Patient has been advised to call insurance company to determine out of pocket expense if they have not yet received this vaccine. Advised may also receive vaccine at local pharmacy or Health Dept. Verbalized acceptance and understanding.  Screening Tests Health Maintenance  Topic Date Due  . COVID-19 Vaccine (3 - Booster for Pfizer series) 02/28/2020  . TETANUS/TDAP  12/05/2023  . COLONOSCOPY (Pts 45-46yrs Insurance coverage will need to be confirmed)  08/21/2024  . INFLUENZA VACCINE  Completed  . Hepatitis C Screening  Completed  . PNA vac Low Risk Adult  Completed  . HPV VACCINES  Aged Out    Health Maintenance  Health Maintenance Due  Topic Date Due  . COVID-19 Vaccine (3 - Booster for Pfizer series) 02/28/2020    Colorectal cancer screening: Type of screening: Colonoscopy. Completed 08/22/2014. Repeat every 10 years  Lung Cancer Screening: (Low Dose CT Chest recommended if Age 92-80 years, 30 pack-year currently smoking OR have quit w/in 15years.) does qualify.   Lung Cancer Screening Referral: no  Additional Screening:  Hepatitis C Screening: does qualify; Completed yes   Vision Screening: Recommended annual ophthalmology exams for early detection of glaucoma and other disorders of the eye. Is the patient  up to date with their annual eye exam?  Yes  Who is the provider or what is the name of the office in which the patient attends annual eye exams? [facility] If pt is not established with a provider, would they like to be referred to a provider to establish care? No .   Dental Screening: Recommended annual dental exams for proper oral hygiene  Community Resource Referral / Chronic Care Management: CRR required this visit?  No   CCM required this visit?  No      Plan:     I have personally reviewed and noted  the following in the patient's chart:   . Medical and social history . Use of alcohol, tobacco or illicit drugs  . Current medications and supplements . Functional ability and status . Nutritional status . Physical activity . Advanced directives . List of other physicians . Hospitalizations, surgeries, and ER visits in previous 12 months . Vitals . Screenings to include cognitive, depression, and falls . Referrals and appointments  In addition, I have reviewed and discussed with patient certain preventive protocols, quality metrics, and best practice recommendations. A written personalized care plan for preventive services as well as general preventive health recommendations were provided to patient.     Sheral Flow, LPN   09/08/8379   Nurse Notes:  Medications reviewed with patient; no opioid use noted.

## 2020-08-11 ENCOUNTER — Telehealth: Payer: Medicare Other

## 2020-08-11 ENCOUNTER — Telehealth: Payer: Self-pay

## 2020-08-11 NOTE — Telephone Encounter (Signed)
   Telephone encounter was:  Successful.  08/11/2020 Name: [name] MRN: [id number] DOB: Dec 09, 1950  [name] is a 69 y.o. year old male who is a primary care patient of Jenny Reichmann, Hunt Oris, MD . The community resource team was consulted for assistance with Financial Difficulties related to paying rent  Care guide performed the following interventions: Patient provided with information about care guide support team and interviewed to confirm resource needs Investigation of community resources performed Obtained verbal consent to place patient referral to [place] a referral through [facility] to [place] and [employer] for rent assistance. Patient also plans to speak to the Secondary school teacher..  Follow Up Plan:  Care guide will follow up with patient by phone over the next 7 days  Vasiliy Mccarry, AAS Paralegal, [place] . Embedded Care Coordination [facility]  Care Management  [address], [place] [address] ??[email or web address]  ?? [phone]   [email or web address]

## 2020-08-11 NOTE — Telephone Encounter (Signed)
Please disregard entered in error. [name]

## 2020-08-15 ENCOUNTER — Telehealth: Payer: Self-pay

## 2020-08-15 NOTE — Telephone Encounter (Signed)
   Telephone encounter was:  Unsuccessful.  08/15/2020 Name: [name] MRN: [id number] DOB: 11/09/1950  Unsuccessful outbound call made today to assist with:  Housing.  Unable to leave message voicemail not set-up. [employer] has attempted to call client twice unable to leave a message voicemail not set-up they will attempt once more.  Outreach Attempt:  2nd Attempt  A HIPAA compliant voice message was left requesting a return call.  Instructed patient to call back at [phone].  Butler Vegh, AAS Paralegal, [place] . Embedded Care Coordination [facility] Health  Care Management  [address], [place] [address] ??[email or web address]  ?? [phone]   [email or web address]

## 2020-08-19 ENCOUNTER — Telehealth: Payer: Self-pay

## 2020-08-19 NOTE — Progress Notes (Signed)
[name] [place] Phone: [phone] Subjective:   [name], am serving as a scribe for Dr. Hulan Saas. This visit occurred during the SARS-CoV-2 public health emergency.  Safety protocols were in place, including screening questions prior to the visit, additional usage of staff PPE, and extensive cleaning of exam room while observing appropriate contact time as indicated for disinfecting solutions.   I'm seeing this patient by the request  of:  Biagio Borg, MD  CC: Right knee pain  FGB:[id number]   07/25/2020 Patient has full range of motion of both elbows.  Patient is mildly tender over the medial epicondylar region on the left side and could be more of an ulnar nerve distribution with some mild pain but no weakness.  Discussed compression.  Continue home exercises.  Right-sided have a radial head injury and some mild discomfort but full range of motion and good grip strength.  Continue with range of motion.  Patient declined physical therapy  Could be secondary to patient's kidney issues.  Encouraged him to follow-up with primary care provider.  No shortness of breath noted.  Vitals stable.  Aspirated right knee again secondary to the amount of swelling.  Patient given a steroid injection because we cannot do anything else for the pain relief.  Gets tramadol from primary care provider.  We discussed taking Tylenol with it.  We discussed compression, we discussed potential for MRI of the knee which patient declined.  Patient will follow up with me again 6 to 8 weeks.   Patient has not had laboratory work-up.  Patient's last GFR was 27.  We will get further evaluation.   Update 08/20/2020 Marquavius Scaife is a 70 y.o. male coming in with complaint of elbow and B knee pain. R>L. Patient states that injection helped but that within past 2 weeks. Notes swelling in R knee.  Patient states that it is almost unrelenting.  Never  without any significant decrease.  Right elbow pain has dissipated but L elbow pain pain increased with elbow extension.  Patient does states that unfortunately the knee pain seems to be more worse than anything else and would like something done for this.       Past Medical History:  Diagnosis Date  . Anemia    normal Fe, nl B12, nl retic, nl EPO July '13  . Aortic regurgitation    Moderate AI 04/17/19 echo  . Blood transfusion without reported diagnosis   . BPH (benign prostatic hyperplasia)   . Chronic back pain   . Chronic kidney disease    CKD III, obstructive nephropathy  . Diverticulosis   . Dysuria   . Elevated PSA, greater than or equal to 20 ng/ml June '13   PSA 107  . Hemorrhoids, internal, with bleeding, prolapse 09/19/2014  . Hyperlipidemia 05/29/2014  . Hypertension   . Obstructive uropathy 11/27/2015  . Pre-diabetes    per patient "reduced sugar intake"  . Renal insufficiency   . Tuberculosis    h/o PPD +  . UTI (urinary tract infection)   . Vitamin D deficiency 09/13/2017   Past Surgical History:  Procedure Laterality Date  . CARPAL TUNNEL RELEASE Right   . COLONOSCOPY    . HEMORRHOID BANDING    . INSERTION OF MESH N/A 10/17/2019   Procedure: Insertion Of Mesh;  Surgeon: Ralene Ok, MD;  Location: [facility];  Service: General;  Laterality: N/A;  . SPLENECTOMY    .  XI ROBOTIC ASSISTED SIMPLE PROSTATECTOMY N/A 03/09/2019   Procedure: XI ROBOTIC ASSISTED SIMPLE PROSTATECTOMY;  Surgeon: Cleon Gustin, MD;  Location[facility];  Service: Urology;  Laterality: N/A;  . XI ROBOTIC ASSISTED VENTRAL HERNIA N/A 10/17/2019   Procedure: XI ROBOTIC ASSISTED INCISIONAL HERNIA REPAIR WITH MESH;  Surgeon: Ralene Ok, MD;  Location: [facility];  Service: General;  Laterality: N/A;   Social History   Socioeconomic History  . Marital status: Single    Spouse name: Not on file  . Number of children: 2  . Years of education: 83  . Highest education level: Some college,  no degree  Occupational History  . Occupation: Lobbyist: [employer]    Comment: unemployed  Tobacco Use  . Smoking status: Former Smoker    Types: Cigarettes    Quit date: 02/26/1980    Years since quitting: 40.5  . Smokeless tobacco: Never Used  Vaping Use  . Vaping Use: Never used  Substance and Sexual Activity  . Alcohol use: No    Alcohol/week: 0.0 standard drinks  . Drug use: No  . Sexual activity: Not Currently  Other Topics Concern  . Not on file  Social History Narrative   Native of [place], raised poor farming community. . To [place] '78 - finished College, all but Arts administrator. Married - wife in [place], blocked from [place] to [place]. 1 son '90; 1 dtr '93. 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 Resources depleted.   Right handed.   Caffeine Hot daily one daily.   Social Determinants of Health   Financial Resource Strain: High Risk  . Difficulty of Paying Living Expenses: Hard  Food Insecurity: Food Insecurity Present  . Worried About Charity fundraiser in the Last Year: Sometimes true  . Ran Out of Food in the Last Year: Sometimes true  Transportation Needs: No Transportation Needs  . Lack of Transportation (Medical): No  . Lack of Transportation (Non-Medical): No  Physical Activity: Inactive  . Days of Exercise per Week: 0 days  . Minutes of Exercise per Session: 0 min  Stress: No Stress Concern Present  . Feeling of Stress : Not at all  Social Connections: Moderately Isolated  . Frequency of Communication with Friends and Family: More than three times a week  . Frequency of Social Gatherings with Friends and Family: Once a week  . Attends Religious Services: Never  . Active Member of Clubs or Organizations: No  . Attends Archivist Meetings: Never  . Marital Status: Married   No Known Allergies Family History  Problem Relation Age of Onset  . Diabetes Mother   . Stroke Brother   . Hearing  loss Brother   . Colon cancer Neg Hx   . Rectal cancer Neg Hx   . Stomach cancer Neg Hx   . Esophageal cancer Neg Hx      Current Outpatient Medications (Cardiovascular):  .  amLODipine (NORVASC) 10 MG tablet, Take 10 mg by mouth 2 (two) times daily. Marland Kitchen  atorvastatin (LIPITOR) 40 MG tablet, Take 40 mg by mouth daily. .  metoprolol tartrate (LOPRESSOR) 50 MG tablet, Take 1 tablet (50 mg total) by mouth 2 (two) times daily.   Current Outpatient Medications (Analgesics):  .  acetaminophen (TYLENOL) 500 MG tablet, Take 1,000 mg by mouth every 6 (six) hours as needed for moderate pain.  Marland Kitchen  allopurinol (ZYLOPRIM) 100 MG tablet, Take by mouth See admin instructions.  Take 2 tablets by mouth daily .  aspirin EC 81 MG tablet, Take 81 mg by mouth daily. .  colchicine 0.6 MG tablet, Take 1 tablet by mouth once daily .  traMADol (ULTRAM) 50 MG tablet, Take 1 tablet (50 mg total) by mouth every 6 (six) hours as needed.  Current Outpatient Medications (Hematological):  .  ferrous sulfate 325 (65 FE) MG tablet, Take 325 mg by mouth daily with breakfast.  Current Outpatient Medications (Other):  .  cholecalciferol (VITAMIN D3) 25 MCG (1000 UNIT) tablet, Take 1,000 Units by mouth daily. .  hydroxypropyl methylcellulose / hypromellose (ISOPTO TEARS / GONIOVISC) 2.5 % ophthalmic solution, Place 1-2 drops into both eyes 3 (three) times daily as needed (dry/irritated eyes.). Marland Kitchen  lactulose (CHRONULAC) 10 GM/15ML solution, Take 30 mLs (20 g total) by mouth daily. Marland Kitchen  linaclotide (LINZESS) 145 MCG CAPS capsule, Take 1 capsule (145 mcg total) by mouth daily before breakfast. .  meclizine (ANTIVERT) 25 MG tablet, Take 1 tablet (25 mg total) by mouth 2 (two) times daily as needed for up to 20 doses for dizziness. .  Multiple Vitamin (MULTIVITAMIN WITH MINERALS) TABS tablet, Take 1 tablet by mouth daily. Marland Kitchen  MYRBETRIQ 25 MG TB24 tablet, Take 25 mg by mouth daily. .  Potassium 99 MG TABS, Take 99 mg by mouth daily.     Reviewed prior external information including notes and imaging from  primary care provider As well as notes that were available from care everywhere and other healthcare systems.  Past medical history, social, surgical and family history all reviewed in electronic medical record.  No pertanent information unless stated regarding to the chief complaint.   Review of Systems:  No headache, visual changes, nausea, vomiting, diarrhea, constipation, dizziness, abdominal pain, skin rash, fevers, chills, night sweats, weight loss, swollen lymph nodes,chest pain, shortness of breath, mood changes. POSITIVE muscle aches, body aches, joint swelling  Objective  Blood pressure 132/74, pulse 61, height 6\' 1"  (1.854 m), weight 268 lb (121.6 kg), SpO2 97 %.   General: No apparent distress alert and oriented x3 mood and affect normal, dressed appropriately.  HEENT: Pupils equal, extraocular movements intact  Respiratory: Patient's speak in full sentences and does not appear short of breath  Cardiovascular: 1+ lower extremity edema, non tender, no erythema  Gait severely antalgic MSK:   Right knee exam does have a significant decrease in range of motion.  Instability with valgus and varus force.  No significant swelling noted of the patellofemoral joint.  Patient unable to do more flexion than 90 degrees.  Procedure: Real-time Ultrasound Guided Injection of right knee Device: GE Logiq Q7 Ultrasound guided injection is preferred based studies that show increased duration, increased effect, greater accuracy, decreased procedural pain, increased response rate, and decreased cost with ultrasound guided versus blind injection.  Verbal informed consent obtained.  Time-out conducted.  Noted no overlying erythema, induration, or other signs of local infection.  Skin prepped in a sterile fashion.  Local anesthesia: Topical Ethyl chloride.  With sterile technique and under real time ultrasound guidance: With  a 22-gauge 2 inch needle patient was injected with 4 cc of 0.5% Marcaine and aspirated 45 cc of straw light-colored fluid 0.5 cc of Kenalog 40 mg/dL. This was from a superior lateral approach.  Completed without difficulty  Pain immediately resolved suggesting accurate placement of the medication.  Advised to call if fevers/chills, erythema, induration, drainage, or persistent bleeding.  Unable to save pictures on ultrasound today. Impression: Technically successful  ultrasound guided injection.   Impression and Recommendations:     The above documentation has been reviewed and is accurate and complete Lyndal Pulley, DO

## 2020-08-19 NOTE — Telephone Encounter (Signed)
   Telephone encounter was:  Successful.  08/19/2020 Name: [name] MRN: [id number] DOB: Oct 16, 1950  [name] is a 70 y.o. year old male who is a primary care patient of Jenny Reichmann, Hunt Oris, MD . The community resource team was consulted for assistance with housing  Care guide performed the following interventions: Discussed resources to assist with housing Patient received information from [facility] and will follow-up with them.  He also has the information for [facility] to call and apply when he is ready. .  Follow Up Plan:  No further follow up planned at this time. The patient has been provided with needed resources.  Yolande Skoda, AAS Paralegal, [place] . Embedded Care Coordination [facility]  [address], [place] [address] ??[email or web address]  ?? [phone]   [email or web address]

## 2020-08-20 ENCOUNTER — Other Ambulatory Visit: Payer: Self-pay

## 2020-08-20 ENCOUNTER — Ambulatory Visit: Payer: Medicare Other | Admitting: Family Medicine

## 2020-08-20 ENCOUNTER — Encounter: Payer: Self-pay | Admitting: Family Medicine

## 2020-08-20 VITALS — BP 132/74 | HR 61 | Ht 73.0 in | Wt 268.0 lb

## 2020-08-20 DIAGNOSIS — M25561 Pain in right knee: Secondary | ICD-10-CM

## 2020-08-20 DIAGNOSIS — G8929 Other chronic pain: Secondary | ICD-10-CM | POA: Diagnosis not present

## 2020-08-20 DIAGNOSIS — M170 Bilateral primary osteoarthritis of knee: Secondary | ICD-10-CM | POA: Diagnosis not present

## 2020-08-20 NOTE — Patient Instructions (Signed)
MRI right knee We will see what MRI shows

## 2020-08-20 NOTE — Assessment & Plan Note (Signed)
Patient is continuing to have recurrent swelling of this knee.  Patient's last white blood cell count still was not significantly elevated.  Recently has been on antibiotics as well so do not feel likely did not do any other further coverage at this moment.  Patient was having increasing instability and pain.  Patient does have moderate to severe arthritic changes per patient would like MRI first which I think is reasonable.  We will get the MRI of the knee to further evaluate and see if there is anything less than a knee replacement that could be done.  Depending on findings we will discuss with patient on the next treatment options.

## 2020-08-26 ENCOUNTER — Encounter: Payer: Self-pay | Admitting: Family

## 2020-08-26 ENCOUNTER — Other Ambulatory Visit: Payer: Self-pay

## 2020-08-26 ENCOUNTER — Ambulatory Visit (INDEPENDENT_AMBULATORY_CARE_PROVIDER_SITE_OTHER): Payer: Medicare Other | Admitting: Family

## 2020-08-26 VITALS — BP 122/78 | HR 64 | Temp 98.0°F | Ht 73.0 in | Wt 265.0 lb

## 2020-08-26 DIAGNOSIS — R600 Localized edema: Secondary | ICD-10-CM | POA: Diagnosis not present

## 2020-08-26 DIAGNOSIS — M25572 Pain in left ankle and joints of left foot: Secondary | ICD-10-CM | POA: Diagnosis not present

## 2020-08-26 LAB — CBC WITH DIFFERENTIAL/PLATELET
Basophils Absolute: 0.1 10*3/uL (ref 0.0–0.1)
Basophils Relative: 0.8 % (ref 0.0–3.0)
Eosinophils Absolute: 0.1 10*3/uL (ref 0.0–0.7)
Eosinophils Relative: 1.2 % (ref 0.0–5.0)
HCT: 43.4 % (ref 39.0–52.0)
Hemoglobin: 14.6 g/dL (ref 13.0–17.0)
Lymphocytes Relative: 37.1 % (ref 12.0–46.0)
Lymphs Abs: 3 10*3/uL (ref 0.7–4.0)
MCHC: 33.5 g/dL (ref 30.0–36.0)
MCV: 87.1 fl (ref 78.0–100.0)
Monocytes Absolute: 0.8 10*3/uL (ref 0.1–1.0)
Monocytes Relative: 9.8 % (ref 3.0–12.0)
Neutro Abs: 4.1 10*3/uL (ref 1.4–7.7)
Neutrophils Relative %: 51.1 % (ref 43.0–77.0)
Platelets: 279 10*3/uL (ref 150.0–400.0)
RBC: 4.99 Mil/uL (ref 4.22–5.81)
RDW: 14.6 % (ref 11.5–15.5)
WBC: 8 10*3/uL (ref 4.0–10.5)

## 2020-08-26 LAB — COMPREHENSIVE METABOLIC PANEL
ALT: 17 U/L (ref 0–53)
AST: 16 U/L (ref 0–37)
Albumin: 4.1 g/dL (ref 3.5–5.2)
Alkaline Phosphatase: 95 U/L (ref 39–117)
BUN: 27 mg/dL — ABNORMAL HIGH (ref 6–23)
CO2: 37 mEq/L — ABNORMAL HIGH (ref 19–32)
Calcium: 9.4 mg/dL (ref 8.4–10.5)
Chloride: 98 mEq/L (ref 96–112)
Creatinine, Ser: 2.05 mg/dL — ABNORMAL HIGH (ref 0.40–1.50)
GFR: 32.3 mL/min — ABNORMAL LOW (ref >60.00)
Glucose, Bld: 101 mg/dL — ABNORMAL HIGH (ref 70–99)
Potassium: 3.4 mEq/L — ABNORMAL LOW (ref 3.5–5.1)
Sodium: 140 mEq/L (ref 135–145)
Total Bilirubin: 1.1 mg/dL (ref 0.2–1.2)
Total Protein: 7.1 g/dL (ref 6.0–8.3)

## 2020-08-26 LAB — BRAIN NATRIURETIC PEPTIDE: Pro B Natriuretic peptide (BNP): 82 pg/mL (ref 0.0–100.0)

## 2020-08-26 NOTE — Progress Notes (Signed)
[name] is a 70 y.o. male with the following history as recorded in EpicCare:  Patient Active Problem List   Diagnosis Date Noted  . Bilateral elbow joint pain 07/09/2020  . Acute renal failure superimposed on chronic kidney disease ([place]) 11/12/2019  . Dry eyes 11/12/2019  . S/P hernia repair 10/17/2019  . Abdominal pain 07/06/2019  . BPH with obstruction/lower urinary tract symptoms 03/09/2019  . Clot retention of urine 02/11/2019  . Vitamin D deficiency 09/13/2017  . Urinary tract infection without hematuria 07/23/2017  . Low back pain 07/03/2017  . Dizziness 06/30/2017  . Acute gouty arthritis 12/01/2016  . Degenerative arthritis of knee, bilateral 11/18/2016  . Urinary retention due to benign prostatic hyperplasia 10/24/2016  . Mass of right side of neck 10/20/2016  . Gout 10/20/2016  . Right knee pain 10/12/2016  . Hypokalemia 05/28/2016  . Encounter for well adult exam with abnormal findings 11/27/2015  . Obstructive uropathy 11/27/2015  . Elevated PSA 11/27/2015  . Acute pyelonephritis 05/27/2015  . Generalized bloating 05/27/2015  . Chronic constipation 05/27/2015  . Chest pain 05/27/2015  . AKI (acute kidney injury) ([place]) 05/27/2015  . Acute sinus infection 11/22/2014  . Cough 09/25/2014  . Hemorrhoids, internal, with bleeding, prolapse 09/19/2014  . 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 Hyperlipidemia 05/29/2014  . Lumbar radiculopathy 05/23/2014  . Lumbar stenosis 11/20/2013  . Abnormal glucose 11/06/2013  . Unspecified hereditary and idiopathic peripheral neuropathy 01/22/2013  . Cardiomyopathy due to hypertension ([place]) 12/28/2011  . CKD (chronic kidney disease) stage 3, GFR 30-59 ml/min ([facility]) 11/24/2011  . Hematuria 11/24/2011  . Hydronephrosis 11/24/2011  . Anemia 11/24/2011  . BRADYCARDIA 02/05/2010  . TINEA PEDIS 01/29/2010  . 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 PERIPHERAL EDEMA 01/29/2010  . ABNORMAL ELECTROCARDIOGRAM 01/29/2010  . POSITIVE  PPD 01/29/2010  . Osteoarthrosis, generalized, 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  . ONYCHOMYCOSIS, TOENAILS 10/02/2007  . BPH (benign prostatic hyperplasia) 10/02/2007  . Essential hypertension 03/06/2007    Current Outpatient Medications  Medication Sig Dispense Refill  . acetaminophen (TYLENOL) 500 MG tablet Take 1,000 mg by mouth every 6 (six) hours as needed for moderate pain.     Marland Kitchen allopurinol (ZYLOPRIM) 100 MG tablet Take by mouth See admin instructions. Take 2 tablets by mouth daily    . amLODipine (NORVASC) 10 MG tablet Take 10 mg by mouth 2 (two) times daily.    Marland Kitchen aspirin EC 81 MG tablet Take 81 mg by mouth daily.    Marland Kitchen atorvastatin (LIPITOR) 40 MG tablet Take 40 mg by mouth daily.    . cholecalciferol (VITAMIN D3) 25 MCG (1000 UNIT) tablet Take 1,000 Units by mouth daily.    . colchicine 0.6 MG tablet Take 1 tablet by mouth once daily 30 tablet 0  . ferrous sulfate 325 (65 FE) MG tablet Take 325 mg by mouth daily with breakfast.    . hydroxypropyl methylcellulose / hypromellose (ISOPTO TEARS / GONIOVISC) 2.5 % ophthalmic solution Place 1-2 drops into both eyes 3 (three) times daily as needed (dry/irritated eyes.). 15 mL 2  . lactulose (CHRONULAC) 10 GM/15ML solution Take 30 mLs (20 g total) by mouth daily. 946 mL 2  . meclizine (ANTIVERT) 25 MG tablet Take 1 tablet (25 mg total) by mouth 2 (two) times daily as needed for up to 20 doses for dizziness. 20 tablet 0  . metoprolol tartrate (LOPRESSOR) 50 MG tablet Take 1 tablet (50 mg total) by mouth 2 (two) times daily. 180 tablet 3  . Multiple  Vitamin (MULTIVITAMIN WITH MINERALS) TABS tablet Take 1 tablet by mouth daily.    Marland Kitchen MYRBETRIQ 25 MG TB24 tablet Take 25 mg by mouth daily.    . Potassium 99 MG TABS Take 99 mg by mouth daily.     . traMADol (ULTRAM) 50 MG tablet Take 1 tablet (50 mg total) by mouth every 6 (six) hours as needed. 30 tablet 0  . linaclotide (LINZESS) 145 MCG CAPS capsule Take 1 capsule (145 mcg total) by mouth daily before  breakfast. (Patient not taking: Reported on 08/26/2020) 90 capsule 4   No current facility-administered medications for this visit.    Allergies: Patient has no known allergies.  Past Medical History:  Diagnosis Date  . Anemia    normal Fe, nl B12, nl retic, nl EPO July '13  . Aortic regurgitation    Moderate AI 04/17/19 echo  . Blood transfusion without reported diagnosis   . BPH (benign prostatic hyperplasia)   . Chronic back pain   . Chronic kidney disease    CKD III, obstructive nephropathy  . Diverticulosis   . Dysuria   . Elevated PSA, greater than or equal to 20 ng/ml June '13   PSA 107  . Hemorrhoids, internal, with bleeding, prolapse 09/19/2014  . Hyperlipidemia 05/29/2014  . Hypertension   . Obstructive uropathy 11/27/2015  . Pre-diabetes    per patient "reduced sugar intake"  . Renal insufficiency   . Tuberculosis    h/o PPD +  . UTI (urinary tract infection)   . Vitamin D deficiency 09/13/2017    Past Surgical History:  Procedure Laterality Date  . CARPAL TUNNEL RELEASE Right   . COLONOSCOPY    . HEMORRHOID BANDING    . INSERTION OF MESH N/A 10/17/2019   Procedure: Insertion Of Mesh;  Surgeon: Ralene Ok, MD;  Location: [facility];  Service: General;  Laterality: N/A;  . SPLENECTOMY    . XI ROBOTIC ASSISTED SIMPLE PROSTATECTOMY N/A 03/09/2019   Procedure: XI ROBOTIC ASSISTED SIMPLE PROSTATECTOMY;  Surgeon: Cleon Gustin, MD;  [facility];  Service: Urology;  Laterality: N/A;  . XI ROBOTIC ASSISTED VENTRAL HERNIA N/A 10/17/2019   Procedure: XI ROBOTIC ASSISTED INCISIONAL HERNIA REPAIR WITH MESH;  Surgeon: Ralene Ok, MD;  [facility];  Service: General;  Laterality: N/A;    Family History  Problem Relation Age of Onset  . Diabetes Mother   . Stroke Brother   . Hearing loss Brother   . Colon cancer Neg Hx   . Rectal cancer Neg Hx   . Stomach cancer Neg Hx   . Esophageal cancer Neg Hx     Social History   Tobacco Use  . Smoking status:  Former Smoker    Types: Cigarettes    Quit date: 02/26/1980    Years since quitting: 40.5  . Smokeless tobacco: Never Used  Substance Use Topics  . Alcohol use: No    Alcohol/week: 0.0 standard drinks    Subjective:   Difficult historian but has had chronic issues with swelling in his lower legs for years; became concerned about worsening pain/ swelling in left lower leg in past 24 hours; denies any chest pain or shortness of breath; denies any injury to the ankle;  In reviewing medications, he takes a high dose of Amlodipine- 10 mg bid per prescription but patient notes only taking qd; he notes the swelling was present before he started Amlodipine; Does have gout as well- takes Allopurinol 100 mg daily;   Objective:  Vitals:   08/26/20 1152  BP: 122/78  Pulse: 64  Temp: 98 F (36.7 C)  TempSrc: Oral  SpO2: 96%  Weight: 265 lb (120.2 kg)  Height: _0  (1.854 m)    General: Well developed, well nourished, in no acute distress  Skin : Warm and dry.  Head: Normocephalic and atraumatic  Lungs: Respirations unlabored; clear to auscultation bilaterally without wheeze, rales, rhonchi  CVS exam: normal rate and regular rhythm.  Abdomen: Soft; nontender; nondistended; normoactive bowel sounds; no masses or hepatosplenomegaly  Musculoskeletal: No deformities; no active joint inflammation  Extremities: bilateral pitting edema- left is greater than right, cyanosis, clubbing  Vessels: Symmetric bilaterally  Neurologic: Alert and oriented; speech intact; face symmetrical; moves all extremities well; CNII-XII intact without focal deficit   Assessment:  1. Pedal edema   2. Left ankle pain, unspecified chronicity     Plan:  Unable to get doppler until tomorrow- scheduled for 9 am to evaluate for DVT; will update labs today and follow up to be determined; may need to consider fluid pill;  This visit occurred during the SARS-CoV-2 public health emergency.  Safety protocols were in place,  including screening questions prior to the visit, additional usage of staff PPE, and extensive cleaning of exam room while observing appropriate contact time as indicated for disinfecting solutions.     No follow-ups on file.  Orders Placed This Encounter  Procedures  . CBC with Differential/Platelet    Standing Status:   Future    Number of Occurrences:   1    Standing Expiration Date:   08/26/2021  . Comp Met (CMET)    Standing Status:   Future    Number of Occurrences:   1    Standing Expiration Date:   08/26/2021  . D-Dimer, Quantitative    Standing Status:   Future    Number of Occurrences:   1    Standing Expiration Date:   08/26/2021  . Brain natriuretic peptide    Standing Status:   Future    Number of Occurrences:   1    Standing Expiration Date:   08/26/2021  . Uric acid    Standing Status:   Future    Standing Expiration Date:   08/26/2021    Requested Prescriptions    No prescriptions requested or ordered in this encounter

## 2020-08-27 ENCOUNTER — Encounter: Payer: Self-pay | Admitting: Internal Medicine

## 2020-08-27 ENCOUNTER — Telehealth: Payer: Self-pay | Admitting: Internal Medicine

## 2020-08-27 ENCOUNTER — Ambulatory Visit (HOSPITAL_COMMUNITY)
Admission: RE | Admit: 2020-08-27 | Discharge: 2020-08-27 | Disposition: A | Discharge status: Home / Self Care | Payer: Medicare Other | Source: Ambulatory Visit | Attending: Cardiology | Admitting: Cardiology

## 2020-08-27 DIAGNOSIS — R600 Localized edema: Secondary | ICD-10-CM | POA: Diagnosis not present

## 2020-08-27 LAB — D-DIMER, QUANTITATIVE: D-Dimer, Quant: 1.44 mcg/mL FEU — ABNORMAL HIGH (ref <0.50)

## 2020-08-27 MED ORDER — TRAMADOL HCL 50 MG PO TABS: 50.0000 mg | ORAL_TABLET | Freq: Four times a day (QID) | ORAL | 0 refills | Status: DC | PRN

## 2020-08-27 MED ORDER — HYDROCODONE-ACETAMINOPHEN 5-325 MG PO TABS: 1.0000 | ORAL_TABLET | Freq: Four times a day (QID) | ORAL | 0 refills | Status: DC | PRN

## 2020-08-27 NOTE — Telephone Encounter (Signed)
Patient called and is requesting pain meds for his left ankle. It can be sent to [facility] [place]. He saw FNP on 08/26/20. Please advise

## 2020-08-27 NOTE — Telephone Encounter (Signed)
I have called pt and informed him of message from the provider. He stated that he has some Tramadol at home and it is not as effective. Pt is requesting some medication for the swelling and Hydrocodone for the pain.   I have informed the patient that I will route the message but I was unsure if he was going to be able to get that medication.

## 2020-08-27 NOTE — Telephone Encounter (Signed)
Ok this time only for the hydrocodone - done erx

## 2020-08-27 NOTE — Addendum Note (Signed)
Addended by: Biagio Borg on: 08/27/2020 05:33 PM   Modules accepted: Orders

## 2020-08-27 NOTE — Telephone Encounter (Signed)
[place] for tramadol prn - done erx

## 2020-08-27 NOTE — Telephone Encounter (Signed)
Doppler was done today.   [name] is out of the office and the pt is requesting some medication for the pain.  Please advise on if it is appropriate to send something in for him and if so please send it to the [facility] off [address].  Thanks.

## 2020-08-30 ENCOUNTER — Ambulatory Visit (INDEPENDENT_AMBULATORY_CARE_PROVIDER_SITE_OTHER): Payer: Medicare Other

## 2020-08-30 ENCOUNTER — Other Ambulatory Visit: Payer: Self-pay

## 2020-08-30 DIAGNOSIS — G8929 Other chronic pain: Secondary | ICD-10-CM | POA: Diagnosis not present

## 2020-08-30 DIAGNOSIS — M25561 Pain in right knee: Secondary | ICD-10-CM

## 2020-08-30 DIAGNOSIS — M1711 Unilateral primary osteoarthritis, right knee: Secondary | ICD-10-CM | POA: Diagnosis not present

## 2020-09-01 ENCOUNTER — Ambulatory Visit: Payer: Medicare Other | Admitting: *Deleted

## 2020-09-02 ENCOUNTER — Other Ambulatory Visit: Payer: Self-pay

## 2020-09-02 DIAGNOSIS — G8929 Other chronic pain: Secondary | ICD-10-CM

## 2020-09-03 ENCOUNTER — Ambulatory Visit (INDEPENDENT_AMBULATORY_CARE_PROVIDER_SITE_OTHER): Payer: Medicare Other | Admitting: Internal Medicine

## 2020-09-03 ENCOUNTER — Encounter: Payer: Self-pay | Admitting: Internal Medicine

## 2020-09-03 VITALS — BP 148/80 | HR 64 | Ht 72.0 in | Wt 269.5 lb

## 2020-09-03 DIAGNOSIS — K642 Third degree hemorrhoids: Secondary | ICD-10-CM | POA: Diagnosis not present

## 2020-09-03 DIAGNOSIS — K5909 Other constipation: Secondary | ICD-10-CM

## 2020-09-03 NOTE — Progress Notes (Signed)
[name] 70 y.o. 1951-03-27 [id number]  Assessment & Plan:   Encounter Diagnoses  Name Primary?  . Chronic constipation Yes  . Prolapsed internal hemorrhoids, grade 3    Adjust lactulose to 1 tablespoon maybe 2 a day.  Use bisacodyl as needed.  Follow-up me in about 2 months.  Stop ferrous sulfate.  He really appears to have a low iron saturation and a low TIBC so I think he has chronic disease changes his hemoglobin has not budged and is normal on the ferrous sulfate.  It might be making his constipation worse.  Prescription medications for constipation are unaffordable.   I appreciate the opportunity to care for this patient. CC: Donald Borg, MD  Subjective:   Chief Complaint: Constipation  HPI The patient returns for follow-up of his chronic constipation he was found to have an impaction last visit.  I started him on lactulose 2 tablespoons a day.  He told us it is not helping but when questioned he was moving his bowels but more so with it the stools were more voluminous and he thinks it flared his hemorrhoids so he was stopping the lactulose.  He is drinking some prune juice which may help.  Hemorrhoids were prolapsing again.  No bleeding. Wt Readings from Last 3 Encounters:  09/03/20 269 lb 8 oz (122.2 kg)  08/26/20 265 lb (120.2 kg)  08/20/20 268 lb (121.6 kg)     No Known Allergies Current Meds  Medication Sig  . acetaminophen (TYLENOL) 500 MG tablet Take 1,000 mg by mouth every 6 (six) hours as needed for moderate pain.   Marland Kitchen allopurinol (ZYLOPRIM) 100 MG tablet Take by mouth See admin instructions. Take 2 tablets by mouth daily  . amLODipine (NORVASC) 10 MG tablet Take 10 mg by mouth 2 (two) times daily.  Marland Kitchen aspirin EC 81 MG tablet Take 81 mg by mouth daily.  Marland Kitchen atorvastatin (LIPITOR) 40 MG tablet Take 40 mg by mouth daily.  . cholecalciferol (VITAMIN D3) 25 MCG (1000 UNIT) tablet Take 1,000 Units by mouth daily.  . colchicine 0.6 MG tablet Take 1 tablet by  mouth once daily  . ferrous sulfate 325 (65 FE) MG tablet Take 325 mg by mouth daily with breakfast.  . HYDROcodone-acetaminophen (NORCO/VICODIN) 5-325 MG tablet Take 1 tablet by mouth every 6 (six) hours as needed.  . hydroxypropyl methylcellulose / hypromellose (ISOPTO TEARS / GONIOVISC) 2.5 % ophthalmic solution Place 1-2 drops into both eyes 3 (three) times daily as needed (dry/irritated eyes.).  Marland Kitchen lactulose (CHRONULAC) 10 GM/15ML solution Take 30 mLs (20 g total) by mouth daily.  . meclizine (ANTIVERT) 25 MG tablet Take 1 tablet (25 mg total) by mouth 2 (two) times daily as needed for up to 20 doses for dizziness.  . metoprolol tartrate (LOPRESSOR) 50 MG tablet Take 1 tablet (50 mg total) by mouth 2 (two) times daily.  . Multiple Vitamin (MULTIVITAMIN WITH MINERALS) TABS tablet Take 1 tablet by mouth daily.  Marland Kitchen MYRBETRIQ 25 MG TB24 tablet Take 25 mg by mouth daily.  . Potassium 99 MG TABS Take 99 mg by mouth daily.   . traMADol (ULTRAM) 50 MG tablet Take 1 tablet (50 mg total) by mouth every 6 (six) hours as needed.   Past Medical History:  Diagnosis Date  . Anemia    normal Fe, nl B12, nl retic, nl EPO July '13  . Aortic regurgitation    Moderate AI 04/17/19 echo  . Blood transfusion without reported diagnosis   .  BPH (benign prostatic hyperplasia)   . Chronic back pain   . Chronic kidney disease    CKD III, obstructive nephropathy  . Diverticulosis   . Dysuria   . Elevated PSA, greater than or equal to 20 ng/ml June '13   PSA 107  . Hemorrhoids, internal, with bleeding, prolapse 09/19/2014  . Hyperlipidemia 05/29/2014  . Hypertension   . Obstructive uropathy 11/27/2015  . Pre-diabetes    per patient "reduced sugar intake"  . Renal insufficiency   . Tuberculosis    h/o PPD +  . UTI (urinary tract infection)   . Vitamin D deficiency 09/13/2017   Past Surgical History:  Procedure Laterality Date  . CARPAL TUNNEL RELEASE Right   . COLONOSCOPY    . HEMORRHOID BANDING    .  INSERTION OF MESH N/A 10/17/2019   Procedure: Insertion Of Mesh;  Surgeon: Ralene Ok, MD;  Location: [facility];  Service: General;  Laterality: N/A;  . SPLENECTOMY    . XI ROBOTIC ASSISTED SIMPLE PROSTATECTOMY N/A 03/09/2019   Procedure: XI ROBOTIC ASSISTED SIMPLE PROSTATECTOMY;  Surgeon: Cleon Gustin, MD;  Location: [facility];  Service: Urology;  Laterality: N/A;  . XI ROBOTIC ASSISTED VENTRAL HERNIA N/A 10/17/2019   Procedure: XI ROBOTIC ASSISTED INCISIONAL HERNIA REPAIR WITH MESH;  Surgeon: Ralene Ok, MD;  Location: [facility];  Service: General;  Laterality: N/A;   Social History   Social History Narrative   Native of [place], raised poor farming community. . To [place] '78 - finished College, all but Arts administrator. Married - wife in [place], blocked from [place] to [place]. 1 son '90; 1 dtr '93. 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 Resources depleted.   Right handed.   Caffeine Hot daily one daily.   family history includes Diabetes in his mother; Hearing loss in his brother; Stroke in his brother.   Review of Systems  As per HPI Objective:   Physical Exam BP (!) 148/80 (BP Location: Left Arm, Patient Position: Sitting, Cuff Size: Normal)   Pulse 64   Ht 6' (1.829 m) Comment: height measured without shoes  Wt 269 lb 8 oz (122.2 kg)   BMI 36.55 kg/m  abd soft and NT Rectal - no impaction

## 2020-09-03 NOTE — Patient Instructions (Addendum)
Try using only 1 tablespoon of lactulose a day. Adjust the dose so it works. You may take 15-30 ml's daily.  Also may use bisacodyl tablets as needed 1 or 2 at night.  Should you need MiraLax can try generic. Coupons provided.  I want you to stop ferrous sulfate - can constipate and you do not need anymore.  I appreciate the opportunity to care for you. Gatha Mayer, MD, [facility]

## 2020-09-09 ENCOUNTER — Ambulatory Visit (INDEPENDENT_AMBULATORY_CARE_PROVIDER_SITE_OTHER): Payer: Medicare Other | Admitting: *Deleted

## 2020-09-09 DIAGNOSIS — I10 Essential (primary) hypertension: Secondary | ICD-10-CM

## 2020-09-09 DIAGNOSIS — N183 Chronic kidney disease, stage 3 unspecified: Secondary | ICD-10-CM

## 2020-09-09 NOTE — Chronic Care Management (AMB) (Signed)
Chronic Care Management   CCM RN Visit Note  09/09/2020 Name: [name] MRN: [id number] DOB: 19-Oct-1950  Subjective: [name] is a 70 y.o. year old male who is a primary care patient of Biagio Borg, MD. The care management team was consulted for assistance with disease management and care coordination needs.    Engaged with patient by telephone for initial visit in response to provider referral for case management and/or care coordination services.   Consent to Services:  The patient was given the following information about Chronic Care Management services today, agreed to services, and gave verbal consent:  1. CCM service includes personalized support from designated clinical staff supervised by the primary care provider, including individualized plan of care and coordination with other care providers  2. 24/7 contact phone numbers for assistance for urgent and routine care needs.  3. Service will only be billed when office clinical staff spend 20 minutes or more in a month to coordinate care.  4. Only one practitioner may furnish and bill the service in a calendar month.  5.The patient may stop CCM services at any time (effective at the end of the month) by phone call to the office staff.  6. The patient will be responsible for cost sharing (co-pay) of up to 20% of the service fee (after annual deductible is met).  Patient agreed to services and consent obtained.  Patient agreed to services and verbal consent obtained.   Assessment: Review of patient past medical history, allergies, medications, health status, including review of consultants reports, laboratory and other test data, was performed as part of comprehensive evaluation and provision of chronic care management services.   SDOH (Social Determinants of Health) assessments and interventions performed:  SDOH Interventions   Flowsheet Row Most Recent Value  SDOH Interventions   Food Insecurity Interventions Other  (Comment)  [Patient confirmed today that he has received resources from previously placed care guide referral- denies further reosurces needs today]  Financial Strain Interventions Other (Comment)  [Patient confirmed today that he has received resources from previously placed care guide referral- denies further reosurces needs today]  Transportation Interventions Intervention Not Indicated       CCM Care Plan  No Known Allergies  Outpatient Encounter Medications as of 09/09/2020  Medication Sig Note  . acetaminophen (TYLENOL) 500 MG tablet Take 1,000 mg by mouth every 6 (six) hours as needed for moderate pain.    Marland Kitchen allopurinol (ZYLOPRIM) 100 MG tablet Take by mouth See admin instructions. Take 2 tablets by mouth daily   . amLODipine (NORVASC) 10 MG tablet Take 10 mg by mouth 2 (two) times daily. 08/07/2020: 08/07/20- patient reports taking QD (not BID)  . aspirin EC 81 MG tablet Take 81 mg by mouth daily.   Marland Kitchen atorvastatin (LIPITOR) 40 MG tablet Take 40 mg by mouth daily.   . cholecalciferol (VITAMIN D3) 25 MCG (1000 UNIT) tablet Take 1,000 Units by mouth daily.   . colchicine 0.6 MG tablet Take 1 tablet by mouth once daily 08/07/2020: 08/07/20: Patient reports takes prn  . HYDROcodone-acetaminophen (NORCO/VICODIN) 5-325 MG tablet Take 1 tablet by mouth every 6 (six) hours as needed.   . hydroxypropyl methylcellulose / hypromellose (ISOPTO TEARS / GONIOVISC) 2.5 % ophthalmic solution Place 1-2 drops into both eyes 3 (three) times daily as needed (dry/irritated eyes.). 08/07/2020: 08/07/20: Reports uses prn  . lactulose (CHRONULAC) 10 GM/15ML solution Take 30 mLs (20 g total) by mouth daily. (Patient taking differently: Take 20 g by mouth  daily. Take 15-30 ml's daily)   . meclizine (ANTIVERT) 25 MG tablet Take 1 tablet (25 mg total) by mouth 2 (two) times daily as needed for up to 20 doses for dizziness. 08/07/2020: 08/07/20: Patient reports taking prn; has not needed recently  . metoprolol tartrate  (LOPRESSOR) 50 MG tablet Take 1 tablet (50 mg total) by mouth 2 (two) times daily.   . Multiple Vitamin (MULTIVITAMIN WITH MINERALS) TABS tablet Take 1 tablet by mouth daily.   Marland Kitchen MYRBETRIQ 25 MG TB24 tablet Take 25 mg by mouth daily. 08/07/2020: 08/07/20: Patient reports he is not currently taking  . Potassium 99 MG TABS Take 99 mg by mouth daily.    . traMADol (ULTRAM) 50 MG tablet Take 1 tablet (50 mg total) by mouth every 6 (six) hours as needed.    No facility-administered encounter medications on file as of 09/09/2020.    Patient Active Problem List   Diagnosis Date Noted  . Bilateral elbow joint pain 07/09/2020  . Acute renal failure superimposed on chronic kidney disease ([facility]) 11/12/2019  . Dry eyes 11/12/2019  . S/P hernia repair 10/17/2019  . Abdominal pain 07/06/2019  . BPH with obstruction/lower urinary tract symptoms 03/09/2019  . Clot retention of urine 02/11/2019  . Vitamin D deficiency 09/13/2017  . Urinary tract infection without hematuria 07/23/2017  . Low back pain 07/03/2017  . Dizziness 06/30/2017  . Acute gouty arthritis 12/01/2016  . Degenerative arthritis of knee, bilateral 11/18/2016  . Urinary retention due to benign prostatic hyperplasia 10/24/2016  . Mass of right side of neck 10/20/2016  . Gout 10/20/2016  . Right knee pain 10/12/2016  . Hypokalemia 05/28/2016  . Encounter for well adult exam with abnormal findings 11/27/2015  . Obstructive uropathy 11/27/2015  . Elevated PSA 11/27/2015  . Acute pyelonephritis 05/27/2015  . Generalized bloating 05/27/2015  . Chronic constipation 05/27/2015  . Chest pain 05/27/2015  . AKI (acute kidney injury) ([place]) 05/27/2015  . Acute sinus infection 11/22/2014  . Cough 09/25/2014  . Hemorrhoids, internal, with bleeding, prolapse 09/19/2014  . 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 Hyperlipidemia 05/29/2014  . Lumbar radiculopathy 05/23/2014  . Lumbar stenosis 11/20/2013  . Abnormal glucose 11/06/2013  . Unspecified hereditary  and idiopathic peripheral neuropathy 01/22/2013  . Cardiomyopathy due to hypertension ([place]) 12/28/2011  . CKD (chronic kidney disease) stage 3, GFR 30-59 ml/min ([facility]) 11/24/2011  . Hematuria 11/24/2011  . Hydronephrosis 11/24/2011  . Anemia 11/24/2011  . BRADYCARDIA 02/05/2010  . TINEA PEDIS 01/29/2010  . 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 PERIPHERAL EDEMA 01/29/2010  . ABNORMAL ELECTROCARDIOGRAM 01/29/2010  . 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  . Osteoarthrosis, generalized, 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  . ONYCHOMYCOSIS, TOENAILS 10/02/2007  . BPH (benign prostatic hyperplasia) 10/02/2007  . Essential hypertension 03/06/2007    Conditions to be addressed/monitored:HTN; CKD  Care Plan : Chronic Kidney (Adult)  Updates made by Knox Royalty, RN since 09/09/2020 12:00 AM    Problem: Disease Progression   Priority: Medium    Long-Range Goal: Disease Progression Prevented or Minimized   Start Date: 08/07/2020  Expected End Date: 02/07/2021  Recent Progress: On track  Priority: Medium  Note:   Current Barriers:  Marland Kitchen Knowledge Deficits related to dietary management of CKD . Care Coordination needs related to resource needs for rent/housing costs and food acquisition in a patient with CKD: patient confirmed resources provided with previous referral placed 08/07/20 . Chronic Disease Management support and education needs related to self-health management of CKD . Unable to  independently verbalize strategies for dietary management of CKD: continues to require ongoing reinforcement Nurse Case Manager Clinical Goal(s):  Over the next 6 months, patient will: . work with RN CM to address needs related to education and information for self-health management of CKD . verbalize basic understanding of CKD disease process and self health management plan as evidenced by patient verbalization of same, including appropriate foods to eat and avoid in in setting of CKD . work with care guide to obtain  resources for food acquisition and rent/ housing costs: confirmed goal met 09/09/20 Interventions:  . 1:1 collaboration with Biagio Borg, MD regarding development and update of comprehensive plan of care as evidenced by provider attestation and co-signature . Inter-disciplinary care team collaboration (see longitudinal plan of care) . Chart reviewed including relevant office notes, upcoming scheduled appointments, and lab results . Evaluation of current treatment plan related to CKD and patient's adherence to plan as established by provider . Discussed current  clinical condition with patient and confirmed no current clinical or medication concerns; confirmed patient believes his recent leg swelling is intermittently better; reports able to walk up to 2 miles, using walking stick . Denies recent medication changes/ concerns; confirms continues to self manage, independently (medication review completed 08/07/20) . Reinforced previously provided education to patient re: appropriate foods to eat and to avoid in setting of CKD: patient reports he does not have previously provided education: will re-send via mail today . Confirmed patient spoke with care guide for community resource information around food acquisition and rent/ housing cost- patient denies ongoing resource needs; states he plans to call provided resources "soon" . Reviewed recent and upcoming scheduled/upcoming provider appointments including: Oct 24, 2020 GI appointment; awaiting re-schedule of evaluation with orthopedic surgeon; confirmed attended all recent appointments; continues to deny transportation resources  . Reviewed recent lab work around kidney function with patient and explained significance of same, using layman's terms . Discussed plans with patient for ongoing care management follow up and provided patient with direct contact information for care management team Patient Goals/Self-Care Activities: . Use the resources  provided to you to help you with affording healthy foods . Choose foods that are low in sodium (salt) . Eat 2 servings of fruit/vegetables every day . Keep a food diary if you have trouble remembering what you eat, or to guide you in your choices . Prepare or eat main meal at home 3 to 5 days each week . Keep healthy snacks on hand . Read food labels for sodium (salt), fat and sugar content . Watch for swelling in feet, ankles and legs every day  . Read over the information about good and bad food choices in kidney disease and start making small gradual changes to your diet: I have re-sent this information to you today . Talk to your doctors about whether a referral to a nutritionist would be helpful for you Follow Up Plan:  Telephone follow up appointment with care management team member scheduled for: Monday Oct 27, 2020 at 9:00 am The patient has been provided with contact information for the care management team and has been advised to call with any health related questions or concerns.        Care Plan : Hypertension (Adult)  Updates made by Knox Royalty, RN since 09/09/2020 12:00 AM    Problem: Hypertension (Hypertension)   Priority: Medium    Long-Range Goal: Hypertension Monitored   Start Date: 08/07/2020  Expected End Date: 02/07/2021  This Visit's  Progress: On track  Recent Progress: Not on track  Priority: Medium  Note:   Objective:  . Last practice recorded BP readings:  BP Readings from Last 3 Encounters:  07/25/20 122/82  07/23/20 (!) 144/76  07/09/20 122/72   . Most recent eGFR/CrCl: No results found for: EGFR  No components found for: CRCL Current Barriers:  Marland Kitchen Knowledge Deficits related to basic understanding of hypertension pathophysiology in setting of CKD- could benefit from ongoing reinforcement . Financial Constraints- care guide referral placed for resources around food acquisition and housing/ rent costs: confirmed resource information provided by care  guide (referral placed 08/07/20) . Unable to independently verbalize blood pressure readings at home; does not regularly monitor blood pressures Case Manager Clinical Goal(s):  Over the next 6 months, patient will: .  demonstrate improved adherence to prescribed treatment plan for hypertension as evidenced by taking all medications as prescribed, monitoring and recording blood pressure weekly, adhering to low sodium/DASH diet Interventions:  . Collaboration with Biagio Borg, MD regarding development and update of comprehensive plan of care as evidenced by provider attestation and co-signature . Inter-disciplinary care team collaboration (see longitudinal plan of care) . UNABLE to independently: verbalize role of blood pressure management in setting of CKD progression: continues to require ongoing reinforcement . Reinforced previously provided education to patient re: DASH diet, role blood pressure plays in management of CKD, dietary management of concurrent CKD/ HTN . Confirmed no current clinical concerns around HTN management: reports recent leg/ ankle swelling swelling related to ongoing orthopedic issue: awaiting re-scheduling of orthopedic surgeon evaluation appointment . Advised patient to continue monitoring/ recording blood pressure weekly, calling PCP for consistent high readings > 150/80; reviewed recent blood pressures with patient: reports 144/82, has started checking at [facility]; hopeful to obtain personal blood pressure cuff soon  . Confirmed no medication concerns/ changes: reports continues taking medications as prescribed (medication review completed 08/07/20) . Discussed plans with patient for ongoing care management follow up and provided patient with direct contact information for care management team Patient Goals: . Continue to check blood pressure weekly . Choose a place to take my blood pressure (home, clinic or office, retail store) . Write blood pressure results in a log or  diary so we can review these together each time we talk . If you experience ongoing or recurrent symptoms of dizziness, please let your doctor know . Continue to take your medications as prescribed . Continue to get good exercise like walking when you are able . Continue to follow a low-salt, heart healthy diet Self-Care Activities: Attends all scheduled provider appointments Calls provider office for new concerns and questions Attempts to follow a low sodium diet/DASH diet Follow Up Plan:  . Telephone follow up appointment with care management team member scheduled for: Monday Oct 27, 2020 at 9:00 am . The patient has been provided with contact information for the care management team and has been advised to call with any health related questions or concerns.       Plan:  Telephone follow up appointment with care management team member scheduled for:  Oct 27, 2020 at 09:00 am  The patient has been provided with contact information for the care management team and has been advised to call with any health related questions or concerns.   [name], RN, BSN, [facility] RN Care Coordination- [place] [phone]: direct office [phone]: mobile

## 2020-09-09 NOTE — Patient Instructions (Signed)
Visit Information  [name], it was nice talking with you today.   Please read over the attached information, and start now to make gradual changes to your diet to help your kidney function and blood pressure   I look forward to talking to you again for an update on Monday, Oct 27, 2020 at 9:00 am- please be listening out for my call that day.  I will call as close to 9:00 am as possible; I look forward to hearing about your progress.   Please don't hesitate to contact me if I can be of assistance to you before our next scheduled appointment.   [name], RN, BSN, [facility] RN Care Coordination- [place] [phone]: direct office [phone]: mobile    PATIENT GOALS:  Goals Addressed            This Visit's Progress   . Manage My Diet for kidney disease   On track    Timeframe:  Long-Range Goal Priority:  Medium Start Date:          08/07/20                   Expected End Date:   02/07/21                    Follow Up Date 10/27/20   . Use the resources provided to you to help you with affording healthy foods . Choose foods that are low in sodium (salt) . Eat 2 servings of fruit/vegetables every day . Keep a food diary if you have trouble remembering what you eat, or to guide you in your choices . Prepare or eat main meal at home 3 to 5 days each week . Keep healthy snacks on hand . Read food labels for sodium (salt), fat and sugar content . Watch for swelling in feet, ankles and legs every day  . Read over the information about good and bad food choices in kidney disease and start making small gradual changes to your diet: I have re-sent this information to you today . Talk to your doctors about whether a referral to a nutritionist would be helpful for you   Why is this important?    A healthy diet is important for mental and physical health.   Healthy food helps repair damaged body tissue and maintains strong bones and muscles.   No  single food is just right so eating a variety of proteins, fruits, vegetables and grains is best.   You may need to change what you eat or drink to manage kidney disease.   A dietitian is the best person to guide you.         . Track and Manage My Blood Pressure-Hypertension   On track    Timeframe:  Long-Range Goal Priority:  Medium Start Date:        08/07/20                     Expected End Date:       02/07/21                Follow Up Date 10/27/20   . Continue to check blood pressure weekly . Choose a place to take my blood pressure (home, clinic or office, retail store) . Write blood pressure results in a log or diary so we can review these together each time we talk . If you experience ongoing or recurrent  symptoms of dizziness, please let your doctor know . Continue to take your medications as prescribed . Continue to get good exercise like walking when you are able . Continue to follow a low-salt, heart healthy diet   Why is this important?    You won't feel high blood pressure, but it can still hurt your blood vessels.   High blood pressure can cause heart or kidney problems. It can also cause a stroke.   Making lifestyle changes like losing a little weight or eating less salt will help.   Checking your blood pressure at home and at different times of the day can help to control blood pressure.   If the doctor prescribes medicine remember to take it the way the doctor ordered.   Call the office if you cannot afford the medicine or if there are questions about it.            Chronic Kidney Disease, Adult Chronic kidney disease is when lasting damage happens to the kidneys slowly over a long time. The kidneys help to:  Make pee (urine).  Make hormones.  Keep the right amount of fluids and chemicals in the body. Most often, this disease does not go away. You must take steps to help keep the kidney damage from getting worse. If steps are not taken, the kidneys  might stop working forever. What are the causes?  Diabetes.  High blood pressure.  Diseases that affect the heart and blood vessels.  Other kidney diseases.  Diseases of the body's disease-fighting system.  A problem with the flow of pee.  Infections of the organs that make pee, store it, and take it out of the body.  Swelling or irritation of your blood vessels. What increases the risk?  Getting older.  Having someone in your family who has kidney disease or kidney failure.  Having a disease caused by genes.  Taking medicines often that harm the kidneys.  Being near or having contact with harmful substances.  Being very overweight.  Using tobacco now or in the past. What are the signs or symptoms?  Feeling very tired.  Having a swollen face, legs, ankles, or feet.  Feeling like you may vomit or vomiting.  Not feeling hungry.  Being confused or not able to focus.  Twitches and cramps in the leg muscles or other muscles.  Dry, itchy skin.  A taste of metal in your mouth.  Making less pee, or making more pee.  Shortness of breath.  Trouble sleeping. You may also become anemic or get weak bones. Anemic means there is not enough red blood cells or hemoglobin in your blood. You may get symptoms slowly. You may not notice them until the kidney damage gets very bad. How is this treated? Often, there is no cure for this disease. Treatment can help with symptoms and help keep the disease from getting worse. You may need to:  Avoid alcohol.  Avoid foods that are high in salt, potassium, phosphorous, and protein.  Take medicines for symptoms and to help control other conditions.  Have dialysis. This treatment gets harmful waste out of your body.  Treat other problems that cause your kidney disease or make it worse. Follow these instructions at home: Medicines  Take over-the-counter and prescription medicines only as told by your doctor.  Do not take any  new medicines, vitamins, or supplements unless your doctor says it is okay. Lifestyle  Do not smoke or use any products that contain nicotine or  tobacco. If you need help quitting, ask your doctor.  If you drink alcohol: ? Limit how much you use to:  0-1 drink a day for women who are not pregnant.  0-2 drinks a day for men. ? Know how much alcohol is in your drink. In the [place] one drink equals one 12 oz bottle of beer (355 mL), one 5 oz glass of wine (148 mL), or one 1 oz glass of hard liquor (44 mL).  Stay at a healthy weight. If you need help losing weight, ask your doctor.   General instructions  Follow instructions from your doctor about what you cannot eat or drink.  Track your blood pressure at home. Tell your doctor about any changes.  If you have diabetes, track your blood sugar.  Exercise at least 30 minutes a day, 5 days a week.  Keep your shots (vaccinations) up to date.  Keep all follow-up visits.   Where to find more information  American Association of Kidney Patients: BombTimer.gl  National Kidney Foundation: www.kidney.[place]: https://mathis.com/  Life Options: www.lifeoptions.org  Kidney School: www.kidneyschool.org Contact a doctor if:  Your symptoms get worse.  You get new symptoms. Get help right away if:  You get symptoms of end-stage kidney disease. These include: ? Headaches. ? Losing feeling in your hands or feet. ? Easy bruising. ? Having hiccups often. ? Chest pain. ? Shortness of breath. ? Lack of menstrual periods, in women.  You have a fever.  You make less pee than normal.  You have pain or you bleed when you pee or poop. These symptoms may be an emergency. Get help right away. Call your local emergency services (911 in the [place]  Do not wait to see if the symptoms will go away.  Do not drive yourself to the hospital. Summary  Chronic kidney disease is when lasting damage happens to the kidneys slowly  over a long time.  Causes of this disease include diabetes and high blood pressure.  Often, there is no cure for this disease. Treatment can help symptoms and help keep the disease from getting worse.  Treatment may involve lifestyle changes, medicines, and dialysis. This information is not intended to replace advice given to you by your health care provider. Make sure you discuss any questions you have with your health care provider. Document Revised: 08/29/2019 Document Reviewed: 08/29/2019 Elsevier Patient Education  2021 [place].    Consent to CCM Services: [name] was given information about Chronic Care Management services today including:  1. CCM service includes personalized support from designated clinical staff supervised by his physician, including individualized plan of care and coordination with other care providers 2. 24/7 contact phone numbers for assistance for urgent and routine care needs. 3. Service will only be billed when office clinical staff spend 20 minutes or more in a month to coordinate care. 4. Only one practitioner may furnish and bill the service in a calendar month. 5. The patient may stop CCM services at any time (effective at the end of the month) by phone call to the office staff. 6. The patient will be responsible for cost sharing (co-pay) of up to 20% of the service fee (after annual deductible is met).  Patient agreed to services and verbal consent obtained.    The patient verbalized understanding of instructions, educational materials, and care plan provided today and agreed to receive a mailed copy of patient instructions, educational materials, and care plan.   Telephone follow  up appointment with care management team member scheduled for: Monday, Oct 27, 2020 at 9:00 am  The patient has been provided with contact information for the care management team and has been advised to call with any health related questions or concerns.   [name], RN, BSN, [facility] RN Care Coordination- [place] [phone]: direct office [phone]: mobile    CLINICAL CARE PLAN: Patient Care Plan: Chronic Kidney (Adult)    Problem Identified: Disease Progression   Priority: Medium    Long-Range Goal: Disease Progression Prevented or Minimized   Start Date: 08/07/2020  Expected End Date: 02/07/2021  Recent Progress: On track  Priority: Medium  Note:   Current Barriers:  Marland Kitchen Knowledge Deficits related to dietary management of CKD . Care Coordination needs related to resource needs for rent/housing costs and food acquisition in a patient with CKD: patient confirmed resources provided with previous referral placed 08/07/20 . Chronic Disease Management support and education needs related to self-health management of CKD . Unable to independently verbalize strategies for dietary management of CKD: continues to require ongoing reinforcement Nurse Case Manager Clinical Goal(s):  Over the next 6 months, patient will: . work with RN CM to address needs related to education and information for self-health management of CKD . verbalize basic understanding of CKD disease process and self health management plan as evidenced by patient verbalization of same, including appropriate foods to eat and avoid in in setting of CKD . work with care guide to obtain resources for food acquisition and rent/ housing costs: confirmed goal met 09/09/20 Interventions:  . 1:1 collaboration with Biagio Borg, MD regarding development and update of comprehensive plan of care as evidenced by provider attestation and co-signature . Inter-disciplinary care team collaboration (see longitudinal plan of care) . Chart reviewed including relevant office notes, upcoming scheduled appointments, and lab results . Evaluation of current treatment plan related to CKD and patient's adherence to plan as established by provider . Discussed current   clinical condition with patient and confirmed no current clinical or medication concerns; confirmed patient believes his recent leg swelling is intermittently better; reports able to walk up to 2 miles, using walking stick . Denies recent medication changes/ concerns; confirms continues to self manage, independently . Reinforced previously provided education to patient re: appropriate foods to eat and to avoid in setting of CKD: patient reports he does not have previously provided education: will re-send via mail today . Confirmed patient spoke with care guide for community resource information around food acquisition and rent/ housing cost- patient denies ongoing resource needs; states he plans to call provided resources "soon" . Reviewed recent and upcoming scheduled/upcoming provider appointments including: Oct 24, 2020 GI appointment; awaiting re-schedule of evaluation with orthopedic surgeon; confirmed attended all recent appointments; continues to deny transportation resources  . Reviewed recent lab work around kidney function with patient and explained significance of same, using layman's terms . Discussed plans with patient for ongoing care management follow up and provided patient with direct contact information for care management team Patient Goals/Self-Care Activities: . Use the resources provided to you to help you with affording healthy foods . Choose foods that are low in sodium (salt) . Eat 2 servings of fruit/vegetables every day . Keep a food diary if you have trouble remembering what you eat, or to guide you in your choices . Prepare or eat main meal at home 3 to 5 days each week . Keep healthy snacks  on hand . Read food labels for sodium (salt), fat and sugar content . Watch for swelling in feet, ankles and legs every day  . Read over the information about good and bad food choices in kidney disease and start making small gradual changes to your diet: I have re-sent this  information to you today . Talk to your doctors about whether a referral to a nutritionist would be helpful for you Follow Up Plan:  Telephone follow up appointment with care management team member scheduled for: Monday Oct 27, 2020 at 9:00 am The patient has been provided with contact information for the care management team and has been advised to call with any health related questions or concerns.        Patient Care Plan: Hypertension (Adult)    Problem Identified: Hypertension (Hypertension)   Priority: Medium    Long-Range Goal: Hypertension Monitored   Start Date: 08/07/2020  Expected End Date: 02/07/2021  This Visit's Progress: On track  Recent Progress: Not on track  Priority: Medium  Note:   Objective:  . Last practice recorded BP readings:  BP Readings from Last 3 Encounters:  07/25/20 122/82  07/23/20 (!) 144/76  07/09/20 122/72   . Most recent eGFR/CrCl: No results found for: EGFR  No components found for: CRCL Current Barriers:  Marland Kitchen Knowledge Deficits related to basic understanding of hypertension pathophysiology in setting of CKD- could benefit from ongoing reinforcement . Financial Constraints- care guide referral placed for resources around food acquisition and housing/ rent costs: confirmed resource information provided by care guide (referral placed 08/07/20) . Unable to independently verbalize blood pressure readings at home; does not regularly monitor blood pressures Case Manager Clinical Goal(s):  Over the next 6 months, patient will: .  demonstrate improved adherence to prescribed treatment plan for hypertension as evidenced by taking all medications as prescribed, monitoring and recording blood pressure weekly, adhering to low sodium/DASH diet Interventions:  . Collaboration with Biagio Borg, MD regarding development and update of comprehensive plan of care as evidenced by provider attestation and co-signature . Inter-disciplinary care team collaboration (see  longitudinal plan of care) . UNABLE to independently: verbalize role of blood pressure management in setting of CKD progression: continues to require ongoing reinforcement . Reinforced previously provided education to patient re: DASH diet, role blood pressure plays in management of CKD, dietary management of concurrent CKD/ HTN . Confirmed no current clinical concerns around HTN management: reports recent leg/ ankle swelling swelling related to ongoing orthopedic issue: awaiting re-scheduling of orthopedic surgeon evaluation appointment . Advised patient to continue monitoring/ recording blood pressure weekly, calling PCP for consistent high readings > 150/80; reviewed recent blood pressures with patient: reports 144/82, has started checking at [facility]; hopeful to obtain personal blood pressure cuff soon  . Confirmed no medication concerns/ changes: reports continues taking medications as prescribed . Discussed plans with patient for ongoing care management follow up and provided patient with direct contact information for care management team Patient Goals: . Continue to check blood pressure weekly . Choose a place to take my blood pressure (home, clinic or office, retail store) . Write blood pressure results in a log or diary so we can review these together each time we talk . If you experience ongoing or recurrent symptoms of dizziness, please let your doctor know . Continue to take your medications as prescribed . Continue to get good exercise like walking when you are able . Continue to follow a low-salt, heart healthy diet Self-Care  Activities: Attends all scheduled provider appointments Calls provider office for new concerns and questions Attempts to follow a low sodium diet/DASH diet Follow Up Plan:  . Telephone follow up appointment with care management team member scheduled for: Monday Oct 27, 2020 at 9:00 am . The patient has been provided with contact information for the care  management team and has been advised to call with any health related questions or concerns.

## 2020-09-30 DIAGNOSIS — M1711 Unilateral primary osteoarthritis, right knee: Secondary | ICD-10-CM | POA: Diagnosis not present

## 2020-10-21 DIAGNOSIS — N189 Chronic kidney disease, unspecified: Secondary | ICD-10-CM | POA: Diagnosis not present

## 2020-10-21 DIAGNOSIS — N2581 Secondary hyperparathyroidism of renal origin: Secondary | ICD-10-CM | POA: Diagnosis not present

## 2020-10-21 DIAGNOSIS — D631 Anemia in chronic kidney disease: Secondary | ICD-10-CM | POA: Diagnosis not present

## 2020-10-21 DIAGNOSIS — M109 Gout, unspecified: Secondary | ICD-10-CM | POA: Diagnosis not present

## 2020-10-21 DIAGNOSIS — N183 Chronic kidney disease, stage 3 unspecified: Secondary | ICD-10-CM | POA: Diagnosis not present

## 2020-10-21 DIAGNOSIS — N390 Urinary tract infection, site not specified: Secondary | ICD-10-CM | POA: Diagnosis not present

## 2020-10-21 DIAGNOSIS — I129 Hypertensive chronic kidney disease with stage 1 through stage 4 chronic kidney disease, or unspecified chronic kidney disease: Secondary | ICD-10-CM | POA: Diagnosis not present

## 2020-10-24 ENCOUNTER — Ambulatory Visit: Payer: Medicare Other | Admitting: Internal Medicine

## 2020-10-27 ENCOUNTER — Ambulatory Visit (INDEPENDENT_AMBULATORY_CARE_PROVIDER_SITE_OTHER): Payer: Medicare Other | Admitting: *Deleted

## 2020-10-27 ENCOUNTER — Encounter: Payer: Self-pay | Admitting: *Deleted

## 2020-10-27 ENCOUNTER — Other Ambulatory Visit: Payer: Self-pay | Admitting: Internal Medicine

## 2020-10-27 ENCOUNTER — Telehealth: Payer: Self-pay | Admitting: *Deleted

## 2020-10-27 DIAGNOSIS — N183 Chronic kidney disease, stage 3 unspecified: Secondary | ICD-10-CM | POA: Diagnosis not present

## 2020-10-27 DIAGNOSIS — I10 Essential (primary) hypertension: Secondary | ICD-10-CM | POA: Diagnosis not present

## 2020-10-27 NOTE — Chronic Care Management (AMB) (Signed)
Chronic Care Management   CCM RN Visit Note  10/27/2020 Name: [name] MRN: [id number] DOB: 04-29-1951  Subjective: [name] is a 70 y.o. year old male who is a primary care patient of Biagio Borg, MD. The care management team was consulted for assistance with disease management and care coordination needs.    Engaged with patient by telephone for follow up visit in response to provider referral for case management and/or care coordination services.   Consent to Services:  The patient was given information about Chronic Care Management services, agreed to services, and gave verbal consent prior to initiation of services.  Please see initial visit note for detailed documentation.  Patient agreed to services and verbal consent obtained.   Assessment: Review of patient past medical history, allergies, medications, health status, including review of consultants reports, laboratory and other test data, was performed as part of comprehensive evaluation and provision of chronic care management services.   CCM Care Plan  No Known Allergies  Outpatient Encounter Medications as of 10/27/2020  Medication Sig Note  . acetaminophen (TYLENOL) 500 MG tablet Take 1,000 mg by mouth every 6 (six) hours as needed for moderate pain.    Marland Kitchen allopurinol (ZYLOPRIM) 100 MG tablet Take by mouth See admin instructions. Take 2 tablets by mouth daily   . amLODipine (NORVASC) 10 MG tablet Take 10 mg by mouth 2 (two) times daily. 08/07/2020: 08/07/20- patient reports taking QD (not BID)  . aspirin EC 81 MG tablet Take 81 mg by mouth daily.   Marland Kitchen atorvastatin (LIPITOR) 40 MG tablet Take 40 mg by mouth daily.   . cholecalciferol (VITAMIN D3) 25 MCG (1000 UNIT) tablet Take 1,000 Units by mouth daily.   . colchicine 0.6 MG tablet Take 1 tablet by mouth once daily 08/07/2020: 08/07/20: Patient reports takes prn  . HYDROcodone-acetaminophen (NORCO/VICODIN) 5-325 MG tablet Take 1 tablet by mouth every 6 (six) hours as  needed.   . hydroxypropyl methylcellulose / hypromellose (ISOPTO TEARS / GONIOVISC) 2.5 % ophthalmic solution Place 1-2 drops into both eyes 3 (three) times daily as needed (dry/irritated eyes.). 08/07/2020: 08/07/20: Reports uses prn  . lactulose (CHRONULAC) 10 GM/15ML solution Take 30 mLs (20 g total) by mouth daily. (Patient taking differently: Take 20 g by mouth daily. Take 15-30 ml's daily)   . meclizine (ANTIVERT) 25 MG tablet Take 1 tablet (25 mg total) by mouth 2 (two) times daily as needed for up to 20 doses for dizziness. 08/07/2020: 08/07/20: Patient reports taking prn; has not needed recently  . metoprolol tartrate (LOPRESSOR) 50 MG tablet Take 1 tablet (50 mg total) by mouth 2 (two) times daily.   . Multiple Vitamin (MULTIVITAMIN WITH MINERALS) TABS tablet Take 1 tablet by mouth daily.   Marland Kitchen MYRBETRIQ 25 MG TB24 tablet Take 25 mg by mouth daily. 08/07/2020: 08/07/20: Patient reports he is not currently taking  . Potassium 99 MG TABS Take 99 mg by mouth daily.    . traMADol (ULTRAM) 50 MG tablet Take 1 tablet (50 mg total) by mouth every 6 (six) hours as needed.    No facility-administered encounter medications on file as of 10/27/2020.    Patient Active Problem List   Diagnosis Date Noted  . Bilateral elbow joint pain 07/09/2020  . Acute renal failure superimposed on chronic kidney disease ([place]) 11/12/2019  . Dry eyes 11/12/2019  . S/P hernia repair 10/17/2019  . Abdominal pain 07/06/2019  . BPH with obstruction/lower urinary tract symptoms 03/09/2019  . Clot retention  of urine 02/11/2019  . Vitamin D deficiency 09/13/2017  . Urinary tract infection without hematuria 07/23/2017  . Low back pain 07/03/2017  . Dizziness 06/30/2017  . Acute gouty arthritis 12/01/2016  . Degenerative arthritis of knee, bilateral 11/18/2016  . Urinary retention due to benign prostatic hyperplasia 10/24/2016  . Mass of right side of neck 10/20/2016  . Gout 10/20/2016  . Right knee pain 10/12/2016  .  Hypokalemia 05/28/2016  . Encounter for well adult exam with abnormal findings 11/27/2015  . Obstructive uropathy 11/27/2015  . Elevated PSA 11/27/2015  . Acute pyelonephritis 05/27/2015  . Generalized bloating 05/27/2015  . Chronic constipation 05/27/2015  . Chest pain 05/27/2015  . AKI (acute kidney injury) ([place]) 05/27/2015  . Acute sinus infection 11/22/2014  . Cough 09/25/2014  . Hemorrhoids, internal, with bleeding, prolapse 09/19/2014  . 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 Hyperlipidemia 05/29/2014  . Lumbar radiculopathy 05/23/2014  . Lumbar stenosis 11/20/2013  . Abnormal glucose 11/06/2013  . Unspecified hereditary and idiopathic peripheral neuropathy 01/22/2013  . Cardiomyopathy due to hypertension ([place]) 12/28/2011  . CKD (chronic kidney disease) stage 3, GFR 30-59 ml/min ([facility]) 11/24/2011  . Hematuria 11/24/2011  . Hydronephrosis 11/24/2011  . Anemia 11/24/2011  . BRADYCARDIA 02/05/2010  . TINEA PEDIS 01/29/2010  . 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 PERIPHERAL EDEMA 01/29/2010  . ABNORMAL ELECTROCARDIOGRAM 01/29/2010  . 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  . Osteoarthrosis, generalized, 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  . ONYCHOMYCOSIS, TOENAILS 10/02/2007  . BPH (benign prostatic hyperplasia) 10/02/2007  . Essential hypertension 03/06/2007    Conditions to be addressed/monitored:HTN and CKD Stage III  Care Plan : Chronic Kidney (Adult)  Updates made by Knox Royalty, RN since 10/27/2020 12:00 AM    Problem: Disease Progression   Priority: Medium    Long-Range Goal: Disease Progression Prevented or Minimized   Start Date: 08/07/2020  Expected End Date: 02/07/2021  This Visit's Progress: On track  Recent Progress: On track  Priority: Medium  Note:   Current Barriers:  Marland Kitchen Knowledge Deficits related to dietary management of CKD . Care Coordination needs related to resource needs for rent/housing costs and food acquisition in a patient with CKD: patient confirmed resources  provided with previous referral placed 08/07/20 . Chronic Disease Management support and education needs related to self-health management of CKD . Unable to independently verbalize strategies for dietary management of CKD: continues to require ongoing reinforcement Nurse Case Manager Clinical Goal(s):  Over the next 6 months, patient will: . work with RN CM to address needs related to education and information for self-health management of CKD . verbalize basic understanding of CKD disease process and self health management plan as evidenced by patient verbalization of same, including appropriate foods to eat and avoid in in setting of CKD . work with care guide to obtain resources for food acquisition and rent/ housing costs: confirmed goal met 09/09/20 Interventions:  . 1:1 collaboration with Biagio Borg, MD regarding development and update of comprehensive plan of care as evidenced by provider attestation and co-signature . Inter-disciplinary care team collaboration (see longitudinal plan of care) . Chart reviewed including relevant office notes, upcoming scheduled appointments, and lab results . Evaluation of current treatment plan related to CKD and patient's adherence to plan as established by provider . Discussed current  clinical condition with patient and confirmed no current clinical or medication concerns; reviewed recent office visit with Dr. Justin Mend with patient: he reports he was told that kidney function is stable; routine follow up as  needed/ scheduled . Denies recent medication changes/ concerns; confirms continues to self manage independently  . Reinforced previously provided education to patient re: appropriate foods to eat and to avoid in setting of CKD: patient continues to require ongoing reinforcement . Discussed plans with patient for ongoing care management follow up and provided patient with direct contact information for care management team Patient Goals/Self-Care  Activities: . I am glad you went to visit Dr. Justin Mend recently and were told your kidney function is stable- stay in touch with Dr. Justin Mend regularly . Use the resources provided to you to help you with affording healthy foods . Choose foods that are low in sodium (salt) . Eat 2 servings of fruit/vegetables every day . Keep a food diary if you have trouble remembering what you eat, or to guide you in your choices . Prepare or eat main meal at home 3 to 5 days each week . Keep healthy snacks on hand . Read food labels for sodium (salt), fat and sugar content . Watch for swelling in feet, ankles and legs every day  . Read over the information about good and bad food choices in kidney disease and start making small gradual changes to your diet: please review the information sent to you as often as you need to . Talk to your doctors about whether a referral to a nutritionist would be helpful for you Follow Up Plan:  Telephone follow up appointment with care management team member scheduled for: Monday December 15, 2020 at 9:15 am The patient has been provided with contact information for the care management team and has been advised to call with any health related questions or concerns.        Care Plan : Hypertension (Adult)  Updates made by Knox Royalty, RN since 10/27/2020 12:00 AM    Problem: Hypertension (Hypertension)   Priority: Medium    Long-Range Goal: Hypertension Monitored   Start Date: 08/07/2020  Expected End Date: 02/07/2021  This Visit's Progress: On track  Recent Progress: On track  Priority: Medium  Note:   Objective:  . Last practice recorded BP readings:  BP Readings from Last 3 Encounters:  07/25/20 122/82  07/23/20 (!) 144/76  07/09/20 122/72   . Most recent eGFR/CrCl: No results found for: EGFR  No components found for: CRCL Current Barriers:  Marland Kitchen Knowledge Deficits related to basic understanding of hypertension pathophysiology in setting of CKD- could benefit from  ongoing reinforcement . Financial Constraints- care guide referral placed for resources around food acquisition and housing/ rent costs: confirmed resource information provided by care guide (referral placed 08/07/20) . Unable to independently verbalize blood pressure readings at home; does not regularly monitor blood pressures Case Manager Clinical Goal(s):  Over the next 6 months, patient will: .  demonstrate improved adherence to prescribed treatment plan for hypertension as evidenced by taking all medications as prescribed, monitoring and recording blood pressure weekly, adhering to low sodium/DASH diet Interventions:  . Collaboration with Biagio Borg, MD regarding development and update of comprehensive plan of care as evidenced by provider attestation and co-signature . Inter-disciplinary care team collaboration (see longitudinal plan of care) . UNABLE to independently: verbalize role of blood pressure management in setting of CKD progression: continues to require ongoing reinforcement . Reinforced previously provided education to patient re: DASH diet, role blood pressure plays in management of CKD, dietary management of concurrent CKD/ HTN . Confirmed no current clinical concerns around HTN management: reports recent leg/ ankle swelling swelling related  to ongoing orthopedic issue: today he reports he attended recent appointment and was told that he possibly needs knee replacements; he is concerned about having surgery, including cost of surgery; not sure he wishes to have- considering making follow up appointment with orthopedic provider . Reports ongoing (R) knee swelling/ pain- he asks about Dr. Jenny Reichmann refilling his narcotic prescription: explained that Dr. Jenny Reichmann may wish to re-evaluate and may wish to defer to orthopedic provider: encouraged him to contact his outpatient pharmacy to request refill and also advised to continue to keep PCP aware of clinical condition/ needs . Confirmed patient  continues intermittently monitoring/ recording blood pressure weekly, has not been writing down consistently: encouraged him to record blood pressures; explained this information will help PCP manage medications; patient reports less frequent, but occasional periods of dizziness that he says are resolved promptly with rest . Confirmed no medication concerns/ changes: reports continues taking medications as prescribed  . Confirmed patient continues to follow low-sodium heart healthy, renal diet per his report . Reviewed with patient upcoming PCP appointment 12/10/20- patient verbalizes plans to attend as scheduled . Discussed plans with patient for ongoing care management follow up and provided patient with direct contact information for care management team Patient Goals: . Continue to check blood pressure weekly-- please start writing down your blood pressures each time you check it: this will help your doctors know if you are on the right medications . Choose a place to take my blood pressure (home, clinic or office, retail store) . Write blood pressure results in a log or diary so we can review these together each time we talk . If you experience ongoing or recurrent symptoms of dizziness, or it gets worse or increases in frequency-- please let your doctor know . Continue to take your medications as prescribed . Continue to get good exercise like walking when you are able . Continue to follow a low-salt, heart healthy diet Self-Care Activities: . Attends all scheduled provider appointments . Calls provider office for new concerns and questions . Attempts to follow a low sodium diet/DASH diet Follow Up Plan:  . Telephone follow up appointment with care management team member scheduled for: Monday December 15, 2020 at 9:15 am . The patient has been provided with contact information for the care management team and has been advised to call with any health related questions or concerns.        Plan:  Telephone follow up appointment with care management team member scheduled for:  Monday, December 15, 2020 at 9:15 am  The patient has been provided with contact information for the care management team and has been advised to call with any health related questions or concerns  [name], RN, BSN, [place] [phone]: direct office [phone]: mobile

## 2020-10-27 NOTE — Patient Instructions (Signed)
Visit Information  [name], it was nice talking with you today   Please read over the attached information, and start now to begin writing your blood pressures down whenever you check them- this will help your doctor make sure you are on the right medications   I look forward to talking to you again for an update on Monday December 15, 2020 at 9:15 am- please be listening out for my call that day.  I will call as close to 9:15 am as possible; I look forward to hearing about your progress.   Please don't hesitate to contact me if I can be of assistance to you before our next scheduled appointment.   [name], RN, BSN, [facility] RN Care Coordination- [place] [phone]: direct office [phone]: mobile     PATIENT GOALS: Goals Addressed            This Visit's Progress   . Manage My Diet for kidney disease   On track    Timeframe:  Long-Range Goal Priority:  Medium Start Date:          08/07/20                   Expected End Date:   02/07/21                    Follow Up Date 12/15/20   . I am glad you went to visit Dr. Justin Mend recently and were told your kidney function is stable- stay in touch with Dr. Justin Mend regularly . Use the resources provided to you to help you with affording healthy foods . Choose foods that are low in sodium (salt) . Eat 2 servings of fruit/vegetables every day . Keep a food diary if you have trouble remembering what you eat, or to guide you in your choices . Prepare or eat main meal at home 3 to 5 days each week . Keep healthy snacks on hand . Read food labels for sodium (salt), fat and sugar content . Watch for swelling in feet, ankles and legs every day  . Read over the information about good and bad food choices in kidney disease and start making small gradual changes to your diet: please review the information sent to you as often as you need to . Talk to your doctors about whether a referral to a nutritionist would  be helpful for you   Why is this important?    A healthy diet is important for mental and physical health.   Healthy food helps repair damaged body tissue and maintains strong bones and muscles.   No single food is just right so eating a variety of proteins, fruits, vegetables and grains is best.   You may need to change what you eat or drink to manage kidney disease.   A dietitian is the best person to guide you.         . Track and Manage My Blood Pressure-Hypertension   On track    Timeframe:  Long-Range Goal Priority:  Medium Start Date:        08/07/20                     Expected End Date:       02/07/21                Follow Up Date 12/15/20   . Continue to check blood pressure weekly-- please start  writing down your blood pressures each time you check it: this will help your doctors know if you are on the right medications . Choose a place to take my blood pressure (home, clinic or office, retail store) . Write blood pressure results in a log or diary so we can review these together each time we talk . If you experience ongoing or recurrent symptoms of dizziness, or it gets worse or increases in frequency-- please let your doctor know . Continue to take your medications as prescribed . Continue to get good exercise like walking when you are able . Continue to follow a low-salt, heart healthy diet   Why is this important?    You won't feel high blood pressure, but it can still hurt your blood vessels.   High blood pressure can cause heart or kidney problems. It can also cause a stroke.   Making lifestyle changes like losing a little weight or eating less salt will help.   Checking your blood pressure at home and at different times of the day can help to control blood pressure.   If the doctor prescribes medicine remember to take it the way the doctor ordered.   Call the office if you cannot afford the medicine or if there are questions about it.            Food  Basics for Chronic Kidney Disease Chronic kidney disease (CKD) occurs when the kidneys are permanently damaged over a long period of time. When your kidneys are not working well, they cannot remove waste, fluids, and other substances from your blood as well as they did before. The substances can build up, which can worsen kidney damage and affect how your body functions. Certain foods lead to a buildup of these substances. By changing your diet, you can help prevent more kidney damage and delay or prevent the need for dialysis. What are tips for following this plan? Reading food labels Check the amount of salt (sodium) in foods. Choose foods that have less than 300 milligrams (mg) per serving. Check the ingredient list for phosphorus or potassium-based additives or preservatives. Check the amount of saturated fat and trans fat. Limit or avoid these fats as told by your dietitian. Shopping Avoid buying foods that are: Processed or prepackaged. Calcium-enriched or that have calcium added to them (are fortified). Do not buy foods that have salt or sodium listed among the first five ingredients. Buy canned vegetables and beans that say "no salt added" or "low sodium" and rinse them before eating. Cooking Soak vegetables, such as potatoes, before cooking to reduce potassium. To do this: Peel and cut the vegetables into small pieces. Soak the vegetables in warm water for at least 2 hours. For every 1 cup of vegetables, use 10 cups of water. Drain and rinse the vegetables with warm water. Boil the vegetables for at least 5 minutes. Meal planning Limit the amount of protein you eat from plant and animal sources each day. Do not add salt to food when cooking or before eating. Eat meals and snacks at around the same time each day. General information Talk with your health care provider about whether you should take a vitamin and mineral supplement. Use standard measuring cups and spoons to measure  servings of foods. Use a kitchen scale to measure portions of protein foods. If told by your health care provider, avoid drinking too much fluid. Measure and count all liquids, including water, ice, soups, flavored gelatin, and frozen desserts such  as ice pops or ice cream. If you have diabetes: If you have diabetes (diabetes mellitus) and CKD, it is important to keep your blood sugar (glucose) in the target range recommended by your health care provider. Follow your diabetes management plan. This may include: Checking your blood glucose regularly. Taking medicines by mouth, taking insulin, or taking both. Exercising for at least 30 minutes on 5 or more days each week, or as told by your health care provider. Tracking how many servings of carbohydrates you eat at each meal. You may be given specific guidelines on how much of certain foods and nutrients you may eat, depending on your stage of kidney disease and whether you have high blood pressure (hypertension). Follow your meal plan as told by your dietitian. What nutrients should I limit? Work with your health care provider and dietitian to develop a meal plan that is right for you. Foods you can eat and foods you should limit or avoid will depend on the stage of your kidney disease and any other health conditions you have. The items listed below are not a complete list. Talk with your dietitian about what dietary choices are best for you. Potassium Potassium affects how steadily your heart beats. If too much potassium builds up in your blood, the potassium can cause an irregular heartbeat or even a heart attack. You may need to limit or avoid foods that are high in potassium, such as: Milk and soy milk. Fruits, such as bananas, apricots, nectarines, melon, prunes, raisins, kiwi, and oranges. Vegetables, such as potatoes, sweet potatoes, yams, tomatoes, leafy greens, beets, avocado, pumpkin, and winter squash. White and lima beans. Whole-wheat  breads and pastas. Beans and nuts. Phosphorus Phosphorus is a mineral found in your bones. A balance between calcium and phosphorus is needed to build and maintain healthy bones. Too much phosphorus pulls calcium from your bones. This can make your bones weak and more likely to break. Too much phosphorus can also make your skin itch. You may need to limit or avoid foods that are high in phosphorus, such as: Milk and dairy products. Dried beans and peas. Tofu, soy milk, and other soy-based meat replacements. Dark-colored sodas. Nuts and peanut butter. Meat, poultry, and fish. Bran cereals and oatmeal. Protein Protein helps you make and keep muscle. It also helps to repair your body's cells and tissues. One of the natural breakdown products of protein is a waste product called urea. When your kidneys are not working properly, they cannot remove wastes, such as urea. Reducing how much protein you eat can help prevent a buildup of urea in your blood. Depending on your stage of kidney disease, you may need to limit foods that are high in protein. Sources of animal protein include: Meat (all types). Fish and seafood. Poultry. Eggs. Dairy. Other protein foods include: Beans and legumes. Nuts and nut butter. Soy and tofu.   Sodium Sodium helps to maintain a healthy balance of fluids in your body. Too much sodium can increase your blood pressure and have a negative effect on your heart and lungs. Too much sodium can also cause your body to retain too much fluid, making your kidneys work harder. Most people should have less than 2,300 mg of sodium each day. If you have hypertension, you may need to limit your sodium to 1,500 mg each day. You may need to limit or avoid foods that are high in sodium, such as: Salt seasonings. Soy sauce. Cured and processed meats. Salted crackers and  snack foods. Fast food. Canned soups and most canned foods. Pickled foods. Vegetable juice. Boxed mixes or  ready-to-eat boxed meals and side dishes. Bottled dressings, sauces, and marinades. Talk with your dietitian about how much potassium, phosphorus, protein, and sodium you may have each day. Summary Chronic kidney disease (CKD) can lead to a buildup of waste and extra substances in the body. Certain foods lead to a buildup of these substances. By changing your diet as told, you can help prevent more kidney damage and delay or prevent the need for dialysis. Food intake changes are different for each person with CKD. Work with a dietitian to set up nutrient goals and a meal plan that is right for you. If you have diabetes and CKD, it is important to keep your blood sugar in the target range recommended by your health care provider. This information is not intended to replace advice given to you by your health care provider. Make sure you discuss any questions you have with your health care provider. Document Revised: 09/17/2019 Document Reviewed: 09/17/2019 Elsevier Patient Education  2021 [employer].   patient has been provided with contact information for the care management team and has been advised to call with any health related questions or concerns.   [name], RN, BSN, [facility] RN Care Coordination- [place] [phone]: direct office [phone]: mobile

## 2020-10-27 NOTE — Telephone Encounter (Signed)
Please refill as per office routine med refill policy (all routine meds refilled for 3 mo or monthly per pt preference up to one year from last visit, then month to month grace period for 3 mo, then further med refills will have to be denied)  

## 2020-10-27 NOTE — Telephone Encounter (Signed)
  Care Management   Follow Up Note   10/27/2020 Name: [name] MRN: [id number] DOB: Nov 06, 1950   Referred by: Biagio Borg, MD Reason for referral : Chronic Care Management (CCM RN CM Telephone Outreach- F/U HTN; CKD; unsuccessful outreach attempt)  An unsuccessful telephone outreach was attempted today. The patient was referred to the case management team for assistance with care management and care coordination.   Attempted to contact patient for previously scheduled appointment this morning- noted new phone number listed for patient: received automated voice message stating that patient has a voice mail box that has not been set up yet; unable to leave voice message requesting call back; attempted patient's old phone number as well- that number rang without physical or voice mail pick up.  Follow Up Plan:  I will request the care guide/ care management team will reach out to the patient to reschedule today's missed phone appointment, if I do not hear back from patient by end of business day today  [name], RN, BSN, [place] [phone]: direct office [phone]: mobile

## 2020-10-31 ENCOUNTER — Other Ambulatory Visit: Payer: Self-pay | Admitting: Internal Medicine

## 2020-10-31 DIAGNOSIS — N1831 Chronic kidney disease, stage 3a: Secondary | ICD-10-CM | POA: Diagnosis not present

## 2020-11-04 NOTE — Progress Notes (Signed)
[facility] [place] Phone: [phone] Subjective:   [name], am serving as a scribe for Dr. Hulan [name]. This visit occurred during the SARS-CoV-2 public health emergency.  Safety protocols were in place, including screening questions prior to the visit, additional usage of staff PPE, and extensive cleaning of exam room while observing appropriate contact time as indicated for disinfecting solutions.   I'm seeing this patient by the request  of:  Donald Borg, MD  CC: Knee pain follow-up  [id number]   08/20/2020 Patient is continuing to have recurrent swelling of this knee.  Patient's last white blood cell count still was not significantly elevated.  Recently has been on antibiotics as well so do not feel likely did not do any other further coverage at this moment.  Patient was having increasing instability and pain.  Patient does have moderate to severe arthritic changes per patient would like MRI first which I think is reasonable.  We will get the MRI of the knee to further evaluate and see if there is anything less than a knee replacement that could be done.  Depending on findings we will discuss with patient on the next treatment options.  Update 11/05/2020 [name] is a 70 y.o. male coming in with complaint of R knee pain.Patient referred to [facility]. Recommended that he have a knee replacement. Patient states that he has not felt much change since last visit.   MRI R knee 08/30/2020 IMPRESSION: 1. Bucket-handle tear of the anterior horn of the lateral meniscus. 2. Underlying tricompartmental degenerative changes, greatest in the lateral compartment. [name] acute osseous findings. 3. Moderate-sized joint effusion and moderate-sized Baker's cyst with evidence of synovitis. 4. The medial meniscus, cruciate and collateral ligaments are intact.    Past Medical History:  Diagnosis Date  . Anemia    normal  Fe, nl B12, nl retic, nl EPO July '13  . Aortic regurgitation    Moderate AI 04/17/19 echo  . Blood transfusion without reported diagnosis   . BPH (benign prostatic hyperplasia)   . Chronic back pain   . Chronic kidney disease    CKD III, obstructive nephropathy  . Diverticulosis   . Dysuria   . Elevated PSA, greater than or equal to 20 ng/ml June '13   PSA 107  . Hemorrhoids, internal, with bleeding, prolapse 09/19/2014  . Hyperlipidemia 05/29/2014  . Hypertension   . Obstructive uropathy 11/27/2015  . Pre-diabetes    per patient "reduced sugar intake"  . Renal insufficiency   . Tuberculosis    h/o PPD +  . UTI (urinary tract infection)   . Vitamin D deficiency 09/13/2017   Past Surgical History:  Procedure Laterality Date  . CARPAL TUNNEL RELEASE Right   . COLONOSCOPY    . HEMORRHOID BANDING    . INSERTION OF MESH N/A 10/17/2019   Procedure: Insertion Of Mesh;  Surgeon: Donald Ok, MD;  Location: [facility];  Service: General;  Laterality: N/A;  . SPLENECTOMY    . XI ROBOTIC ASSISTED SIMPLE PROSTATECTOMY N/A 03/09/2019   Procedure: XI ROBOTIC ASSISTED SIMPLE PROSTATECTOMY;  Surgeon: Donald Gustin, MD;  Location: [facility];  Service: Urology;  Laterality: N/A;  . XI ROBOTIC ASSISTED VENTRAL HERNIA N/A 10/17/2019   Procedure: XI ROBOTIC ASSISTED INCISIONAL HERNIA REPAIR WITH MESH;  Surgeon: Donald Ok, MD;  Location: [facility];  Service: General;  Laterality: N/A;   Social History   Socioeconomic History  .  Marital status: Single    Spouse name: Not on file  . Number of children: 2  . Years of education: 51  . Highest education level: Some college, [name] degree  Occupational History  . Occupation: Lobbyist: [employer]    Comment: unemployed  Tobacco Use  . Smoking status: Former Smoker    Types: Cigarettes    Quit date: 02/26/1980    Years since quitting: 40.7  . Smokeless tobacco: Never Used  Vaping Use  . Vaping Use: Never used  Substance  and Sexual Activity  . Alcohol use: [name]    Alcohol/week: 0.0 standard drinks  . Drug use: [name]  . Sexual activity: Not Currently  Other Topics Concern  . Not on file  Social History Narrative   Native of [place], raised poor farming community. . To [place] '78 - finished College, all but Arts administrator. Married - wife in [place], blocked from [place] to [place]. 1 son '90; 1 dtr '93. 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 Resources depleted.   Right handed.   Caffeine Hot daily one daily.   Social Determinants of Health   Financial Resource Strain: High Risk  . Difficulty of Paying Living Expenses: Hard  Food Insecurity: Food Insecurity Present  . Worried About Charity fundraiser in the Last Year: Sometimes true  . Ran Out of Food in the Last Year: Sometimes true  Transportation Needs: [name] Transportation Needs  . Lack of Transportation (Medical): [name]  . Lack of Transportation (Non-Medical): [name]  Physical Activity: Inactive  . Days of Exercise per Week: 0 days  . Minutes of Exercise per Session: 0 min  Stress: [name] Stress Concern Present  . Feeling of Stress : Not at all  Social Connections: Moderately Isolated  . Frequency of Communication with Friends and Family: More than three times a week  . Frequency of Social Gatherings with Friends and Family: Once a week  . Attends Religious Services: Never  . Active Member of Clubs or Organizations: [name]  . Attends Archivist Meetings: Never  . Marital Status: Married   [name] Known Allergies Family History  Problem Relation Age of Onset  . Diabetes Mother   . Stroke Brother   . Hearing loss Brother   . Colon cancer Neg Hx   . Rectal cancer Neg Hx   . Stomach cancer Neg Hx   . Esophageal cancer Neg Hx      Current Outpatient Medications (Cardiovascular):  .  amLODipine (NORVASC) 10 MG tablet, Take 10 mg by mouth 2 (two) times daily. Marland Kitchen  atorvastatin (LIPITOR) 40 MG tablet, Take 40 mg by mouth daily. .   metoprolol tartrate (LOPRESSOR) 50 MG tablet, Take 1 tablet (50 mg total) by mouth 2 (two) times daily.   Current Outpatient Medications (Analgesics):  .  acetaminophen (TYLENOL) 500 MG tablet, Take 1,000 mg by mouth every 6 (six) hours as needed for moderate pain.  Marland Kitchen  allopurinol (ZYLOPRIM) 100 MG tablet, Take by mouth See admin instructions. Take 2 tablets by mouth daily .  aspirin EC 81 MG tablet, Take 81 mg by mouth daily. .  colchicine 0.6 MG tablet, Take 1 tablet by mouth once daily .  HYDROcodone-acetaminophen (NORCO/VICODIN) 5-325 MG tablet, Take 1 tablet by mouth every 6 (six) hours as needed. .  traMADol (ULTRAM) 50 MG tablet, TAKE 1 TABLET BY MOUTH EVERY 6 HOURS AS NEEDED   Current Outpatient Medications (Other):  .  cholecalciferol (VITAMIN D3) 25 MCG (1000 UNIT) tablet, Take 1,000 Units by mouth daily. .  hydroxypropyl methylcellulose / hypromellose (ISOPTO TEARS / GONIOVISC) 2.5 % ophthalmic solution, Place 1-2 drops into both eyes 3 (three) times daily as needed (dry/irritated eyes.). Marland Kitchen  lactulose (CHRONULAC) 10 GM/15ML solution, Take 30 mLs (20 g total) by mouth daily. (Patient taking differently: Take 20 g by mouth daily. Take 15-30 ml's daily) .  meclizine (ANTIVERT) 25 MG tablet, Take 1 tablet (25 mg total) by mouth 2 (two) times daily as needed for up to 20 doses for dizziness. .  Multiple Vitamin (MULTIVITAMIN WITH MINERALS) TABS tablet, Take 1 tablet by mouth daily. Marland Kitchen  MYRBETRIQ 25 MG TB24 tablet, Take 25 mg by mouth daily. .  Potassium 99 MG TABS, Take 99 mg by mouth daily.    Reviewed prior external information including notes and imaging from  primary care provider As well as notes that were available from care everywhere and other healthcare systems.  Past medical history, social, surgical and family history all reviewed in electronic medical record.  [name] pertanent information unless stated regarding to the chief complaint.   Review of Systems:  [name] headache,  visual changes, nausea, vomiting, diarrhea, constipation, dizziness, abdominal pain, skin rash, fevers, chills, night sweats, weight loss, swollen lymph nodes,chest pain, shortness of breath, mood changes. POSITIVE muscle aches, body aches, joint swelling  Objective  Blood pressure 138/80, pulse (!) 58, height 6' (1.829 m), weight 267 lb (121.1 kg), SpO2 99 %.   General: [name] apparent distress alert and oriented x3 mood and affect normal, dressed appropriately.  HEENT: Pupils equal, extraocular movements intact  Respiratory: Patient's speak in full sentences and does not appear short of breath  Cardiovascular: 1+ lower extremity edema, non tender, [name] erythema  Gait severely antalgic MSK:  Knee exam shows significant effusion noted of the knee.  Patient does have limited range of motion.  Instability with valgus and varus force.  [name] erythema or warmth to touch.  Procedure: Real-time Ultrasound Guided Injection of right knee Device: GE Logiq Q7  Ultrasound guided injection is preferred based studies that show increased duration, increased effect, greater accuracy, decreased procedural pain, increased response rate with ultrasound guided versus blind injection.  Verbal informed consent obtained.  Time-out conducted.  Noted [name] overlying erythema, induration, or other signs of local infection.  Skin prepped in a sterile fashion.  Local anesthesia: Topical Ethyl chloride.  With sterile technique and under real time ultrasound guidance: With an 18-gauge 1-1/2 inch needle patient was injected on the right side in the superior lateral aspect with 2 cc of 0.5% Marcaine and aspirated 40 cc of straw light-colored fluid.. Completed without difficulty  Pain immediately improved suggesting accurate placement of the medication.  Advised to call if fevers/chills, erythema, induration, drainage, or persistent bleeding.  Impression: Technically successful ultrasound guided injection.    Impression and  Recommendations:     The above documentation has been reviewed and is accurate and complete Lyndal Pulley, DO

## 2020-11-05 ENCOUNTER — Other Ambulatory Visit: Payer: Self-pay

## 2020-11-05 ENCOUNTER — Ambulatory Visit: Payer: Self-pay

## 2020-11-05 ENCOUNTER — Ambulatory Visit: Payer: Medicare Other | Admitting: Family Medicine

## 2020-11-05 ENCOUNTER — Encounter: Payer: Self-pay | Admitting: Family Medicine

## 2020-11-05 VITALS — BP 138/80 | HR 58 | Ht 72.0 in | Wt 267.0 lb

## 2020-11-05 DIAGNOSIS — M170 Bilateral primary osteoarthritis of knee: Secondary | ICD-10-CM

## 2020-11-05 DIAGNOSIS — M25561 Pain in right knee: Secondary | ICD-10-CM | POA: Diagnosis not present

## 2020-11-05 DIAGNOSIS — G8929 Other chronic pain: Secondary | ICD-10-CM | POA: Diagnosis not present

## 2020-11-05 NOTE — Assessment & Plan Note (Signed)
Patient does have significant arthritic changes of the knees right greater than left.  Attempted aspiration of the right knee again.  Patient does have a meniscal tear and significant effusion of the knee but due to the severity of the arthritis patient would need a replacement.  Patient is planning on traveling to [place] to see his wife.  This is going to be in July and then will consider the possibility of replacement.  I would like to see patient again in 4 weeks to further evaluate.  Patient has seen his primary care provider and has been given tramadol as well as hydrocodone over the course of time.  We discussed not mixing his medications are willing to use them.  Follow-up with me again in 4 weeks and we will likely aspirate the knee and put a steroid injection in as well as given medication for traveling

## 2020-11-05 NOTE — Patient Instructions (Addendum)
Drained knee today Check with Dr. Mayer Camel if he can get your wife here for surgery Take tramadol with tylenol 1-2x a day See me again in 4 weeks for drain/steroid Look for any redness, swelling, pain at injection sight and seek medical attention

## 2020-11-19 ENCOUNTER — Ambulatory Visit: Payer: Medicare Other | Admitting: Family Medicine

## 2020-11-22 ENCOUNTER — Other Ambulatory Visit: Payer: Self-pay | Admitting: Internal Medicine

## 2020-11-22 NOTE — Telephone Encounter (Signed)
Please refill as per office routine med refill policy (all routine meds refilled for 3 mo or monthly per pt preference up to one year from last visit, then month to month grace period for 3 mo, then further med refills will have to be denied)  

## 2020-11-24 NOTE — Telephone Encounter (Signed)
[place] for [place] to take over refills

## 2020-12-03 NOTE — Progress Notes (Signed)
[facility] [address] [place] Phone: [phone] Subjective:   I Kandace Blitz am serving as a Education administrator for Dr. Hulan Saas.  This visit occurred during the SARS-CoV-2 public health emergency.  Safety protocols were in place, including screening questions prior to the visit, additional usage of staff PPE, and extensive cleaning of exam room while observing appropriate contact time as indicated for disinfecting solutions.   I'm seeing this patient by the request  of:  Biagio Borg, MD  CC: Right greater than left knee pain  [id number]  11/05/2020 Patient does have significant arthritic changes of the knees right greater than left.  Attempted aspiration of the right knee again.  Patient does have a meniscal tear and significant effusion of the knee but due to the severity of the arthritis patient would need a replacement.  Patient is planning on traveling to [place] to see his wife.  This is going to be in July and then will consider the possibility of replacement.  I would like to see patient again in 4 weeks to further evaluate.  Patient has seen his primary care provider and has been given tramadol as well as hydrocodone over the course of time.  We discussed not mixing his medications are willing to use them.  Follow-up with me again in 4 weeks and we will likely aspirate the knee and put a steroid injection in as well as given medication for traveling  Update 12/04/2020 Kolden Dupee is a 70 y.o. male coming in with complaint of B knee pain. Patient states he has had no improvement.  Patient continues to have swelling of the knee.  Using a brace.  Continues to have instability.  Patient still does not want any surgical intervention with him especially traveling to [place] in the near future to visit his wife.      Past Medical History:  Diagnosis Date   Anemia    normal Fe, nl B12, nl retic, nl EPO July '13   Aortic regurgitation     Moderate AI 04/17/19 echo   Blood transfusion without reported diagnosis    BPH (benign prostatic hyperplasia)    Chronic back pain    Chronic kidney disease    CKD III, obstructive nephropathy   Diverticulosis    Dysuria    Elevated PSA, greater than or equal to 20 ng/ml June '13   PSA 107   Hemorrhoids, internal, with bleeding, prolapse 09/19/2014   Hyperlipidemia 05/29/2014   Hypertension    Obstructive uropathy 11/27/2015   Pre-diabetes    per patient "reduced sugar intake"   Renal insufficiency    Tuberculosis    h/o PPD +   UTI (urinary tract infection)    Vitamin D deficiency 09/13/2017   Past Surgical History:  Procedure Laterality Date   CARPAL TUNNEL RELEASE Right    COLONOSCOPY     HEMORRHOID BANDING     INSERTION OF MESH N/A 10/17/2019   Procedure: Insertion Of Mesh;  Surgeon: Ralene Ok, MD;  Location: [facility];  Service: General;  Laterality: N/A;   SPLENECTOMY     XI ROBOTIC ASSISTED SIMPLE PROSTATECTOMY N/A 03/09/2019   Procedure: XI ROBOTIC ASSISTED SIMPLE PROSTATECTOMY;  Surgeon: Cleon Gustin, MD;  Location: [facility];  Service: Urology;  Laterality: N/A;   XI ROBOTIC ASSISTED VENTRAL HERNIA N/A 10/17/2019   Procedure: XI ROBOTIC ASSISTED INCISIONAL HERNIA REPAIR WITH MESH;  Surgeon: Ralene Ok, MD;  Location: [facility];  Service: General;  Laterality: N/A;   Social History   Socioeconomic History   Marital status: Single    Spouse name: Not on file   Number of children: 2   Years of education: 20   Highest education level: Some college, no degree  Occupational History   Occupation: Lobbyist: [employer]    Comment: unemployed  Tobacco Use   Smoking status: Former    Pack years: 0.00    Types: Cigarettes    Quit date: 02/26/1980    Years since quitting: 40.8   Smokeless tobacco: Never  Vaping Use   Vaping Use: Never used  Substance and Sexual Activity   Alcohol use: No    Alcohol/week: 0.0 standard drinks   Drug use:  No   Sexual activity: Not Currently  Other Topics Concern   Not on file  Social History Narrative   Native of [place], raised poor farming community. . To [place] '78 - finished College, all but Arts administrator. Married - wife in [place], blocked from [place] to [place]. 1 son '90; 1 dtr '93. 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 Resources depleted.   Right handed.   Caffeine Hot daily one daily.   Social Determinants of Health   Financial Resource Strain: High Risk   Difficulty of Paying Living Expenses: Hard  Food Insecurity: Food Insecurity Present   Worried About Running Out of Food in the Last Year: Sometimes true   Ran Out of Food in the Last Year: Sometimes true  Transportation Needs: No Transportation Needs   Lack of Transportation (Medical): No   Lack of Transportation (Non-Medical): No  Physical Activity: Inactive   Days of Exercise per Week: 0 days   Minutes of Exercise per Session: 0 min  Stress: No Stress Concern Present   Feeling of Stress : Not at all  Social Connections: Moderately Isolated   Frequency of Communication with Friends and Family: More than three times a week   Frequency of Social Gatherings with Friends and Family: Once a week   Attends Religious Services: Never   Marine scientist or Organizations: No   Attends Music therapist: Never   Marital Status: Married   No Known Allergies Family History  Problem Relation Age of Onset   Diabetes Mother    Stroke Brother    Hearing loss Brother    Colon cancer Neg Hx    Rectal cancer Neg Hx    Stomach cancer Neg Hx    Esophageal cancer Neg Hx      Current Outpatient Medications (Cardiovascular):    amLODipine (NORVASC) 10 MG tablet, Take 10 mg by mouth 2 (two) times daily.   atorvastatin (LIPITOR) 40 MG tablet, Take 40 mg by mouth daily.   metoprolol tartrate (LOPRESSOR) 50 MG tablet, Take 1 tablet (50 mg total) by mouth 2 (two) times daily.   Current  Outpatient Medications (Analgesics):    acetaminophen (TYLENOL) 500 MG tablet, Take 1,000 mg by mouth every 6 (six) hours as needed for moderate pain.    allopurinol (ZYLOPRIM) 100 MG tablet, Take 2 tablets by mouth once daily   aspirin EC 81 MG tablet, Take 81 mg by mouth daily.   colchicine 0.6 MG tablet, Take 1 tablet by mouth once daily   HYDROcodone-acetaminophen (NORCO/VICODIN) 5-325 MG tablet, Take 1 tablet by mouth every 6 (six) hours as needed.   traMADol (ULTRAM) 50 MG tablet, TAKE 1 TABLET BY MOUTH EVERY 6 HOURS  AS NEEDED   Current Outpatient Medications (Other):    cholecalciferol (VITAMIN D3) 25 MCG (1000 UNIT) tablet, Take 1,000 Units by mouth daily.   hydroxypropyl methylcellulose / hypromellose (ISOPTO TEARS / GONIOVISC) 2.5 % ophthalmic solution, Place 1-2 drops into both eyes 3 (three) times daily as needed (dry/irritated eyes.).   lactulose (CHRONULAC) 10 GM/15ML solution, Take 30 mLs (20 g total) by mouth daily. (Patient taking differently: Take 20 g by mouth daily. Take 15-30 ml's daily)   meclizine (ANTIVERT) 25 MG tablet, Take 1 tablet (25 mg total) by mouth 2 (two) times daily as needed for up to 20 doses for dizziness.   Multiple Vitamin (MULTIVITAMIN WITH MINERALS) TABS tablet, Take 1 tablet by mouth daily.   MYRBETRIQ 25 MG TB24 tablet, Take 25 mg by mouth daily.   Potassium 99 MG TABS, Take 99 mg by mouth daily.    Reviewed prior external information including notes and imaging from  primary care provider As well as notes that were available from care everywhere and other healthcare systems.  Past medical history, social, surgical and family history all reviewed in electronic medical record.  No pertanent information unless stated regarding to the chief complaint.   Review of Systems:  No headache, visual changes, nausea, vomiting, diarrhea, constipation, dizziness, abdominal pain, skin rash, fevers, chills, night sweats, weight loss, swollen lymph nodes, chest  pain, shortness of breath, mood changes. POSITIVE muscle aches, body aches, joint swelling  Objective  Blood pressure 122/80, pulse (!) 57, height 6' (1.829 m), weight 265 lb (120.2 kg), SpO2 96 %.   General: No apparent distress alert and oriented x3 mood and affect normal, dressed appropriately.  HEENT: Pupils equal, extraocular movements intact  Respiratory: Patient's speak in full sentences and does not appear short of breath  Cardiovascular: No lower extremity edema, non tender, no erythema  Gait severely antalgic right knee has significant effusion noted.  Tender to palpation diffusely.   Patient has limited range of motion in all planes.  Procedure: Real-time Ultrasound Guided Injection of right knee Device: GE Logiq Q7 Ultrasound guided injection is preferred based studies that show increased duration, increased effect, greater accuracy, decreased procedural pain, increased response rate, and decreased cost with ultrasound guided versus blind injection.  Verbal informed consent obtained.  Time-out conducted.  Noted no overlying erythema, induration, or other signs of local infection.  Skin prepped in a sterile fashion.  Local anesthesia: Topical Ethyl chloride.  With sterile technique and under real time ultrasound guidance: With a 22-gauge 2 inch needle patient was injected with 4 cc of 0.5% Marcaine and then aspirated 30 cc of straw light-colored fluid then injected 1 cc of Kenalog 40 mg/dL. This was from a superior lateral approach.  Completed without difficulty  Pain immediately resolved suggesting accurate placement of the medication.  Advised to call if fevers/chills, erythema, induration, drainage, or persistent bleeding.   Impression: Technically successful ultrasound guided injection.    Impression and Recommendations:     The above documentation has been reviewed and is accurate and complete Lyndal Pulley, DO

## 2020-12-04 ENCOUNTER — Other Ambulatory Visit: Payer: Self-pay

## 2020-12-04 ENCOUNTER — Ambulatory Visit (INDEPENDENT_AMBULATORY_CARE_PROVIDER_SITE_OTHER): Payer: Medicare Other | Admitting: Family Medicine

## 2020-12-04 ENCOUNTER — Ambulatory Visit: Payer: Self-pay

## 2020-12-04 ENCOUNTER — Encounter: Payer: Self-pay | Admitting: Family Medicine

## 2020-12-04 VITALS — BP 122/80 | HR 57 | Ht 72.0 in | Wt 265.0 lb

## 2020-12-04 DIAGNOSIS — M170 Bilateral primary osteoarthritis of knee: Secondary | ICD-10-CM

## 2020-12-04 DIAGNOSIS — M25561 Pain in right knee: Secondary | ICD-10-CM | POA: Diagnosis not present

## 2020-12-04 DIAGNOSIS — M25562 Pain in left knee: Secondary | ICD-10-CM | POA: Diagnosis not present

## 2020-12-04 DIAGNOSIS — G8929 Other chronic pain: Secondary | ICD-10-CM | POA: Diagnosis not present

## 2020-12-04 NOTE — Assessment & Plan Note (Signed)
Injection given again today.  Tolerated the procedure well, discussed icing regimen and home exercises.  Discussed with patient that should consider the possibility of replacement when patient returns from visiting his wife.  Patient understands this and we will refer accordingly.

## 2020-12-04 NOTE — Patient Instructions (Signed)
Good to see you Keep taking tylenol with tramadol We can see you when you get back We need to consider replacement at that time

## 2020-12-05 LAB — SYNOVIAL FLUID ANALYSIS, COMPLETE
Basophils, %: 0 %
Eosinophils-Synovial: 0 % (ref 0–2)
Lymphocytes-Synovial Fld: 53 % (ref 0–74)
Monocyte/Macrophage: 0 % (ref 0–69)
Neutrophil, Synovial: 42 % — ABNORMAL HIGH (ref 0–24)
Synoviocytes, %: 5 % (ref 0–15)
WBC, Synovial: 128 cells/uL (ref <150)

## 2020-12-10 ENCOUNTER — Encounter: Payer: Self-pay | Admitting: Internal Medicine

## 2020-12-10 ENCOUNTER — Ambulatory Visit (INDEPENDENT_AMBULATORY_CARE_PROVIDER_SITE_OTHER): Payer: Medicare Other | Admitting: Internal Medicine

## 2020-12-10 ENCOUNTER — Other Ambulatory Visit: Payer: Self-pay

## 2020-12-10 VITALS — BP 138/76 | HR 52 | Ht 72.0 in | Wt 261.0 lb

## 2020-12-10 DIAGNOSIS — E538 Deficiency of other specified B group vitamins: Secondary | ICD-10-CM

## 2020-12-10 DIAGNOSIS — E7800 Pure hypercholesterolemia, unspecified: Secondary | ICD-10-CM | POA: Diagnosis not present

## 2020-12-10 DIAGNOSIS — Z0001 Encounter for general adult medical examination with abnormal findings: Secondary | ICD-10-CM

## 2020-12-10 DIAGNOSIS — I10 Essential (primary) hypertension: Secondary | ICD-10-CM | POA: Diagnosis not present

## 2020-12-10 DIAGNOSIS — M25561 Pain in right knee: Secondary | ICD-10-CM

## 2020-12-10 DIAGNOSIS — E559 Vitamin D deficiency, unspecified: Secondary | ICD-10-CM

## 2020-12-10 DIAGNOSIS — I700 Atherosclerosis of aorta: Secondary | ICD-10-CM | POA: Diagnosis not present

## 2020-12-10 DIAGNOSIS — R7309 Other abnormal glucose: Secondary | ICD-10-CM | POA: Diagnosis not present

## 2020-12-10 DIAGNOSIS — N183 Chronic kidney disease, stage 3 unspecified: Secondary | ICD-10-CM | POA: Diagnosis not present

## 2020-12-10 DIAGNOSIS — G8929 Other chronic pain: Secondary | ICD-10-CM | POA: Insufficient documentation

## 2020-12-10 LAB — BASIC METABOLIC PANEL
BUN: 27 mg/dL — ABNORMAL HIGH (ref 6–23)
CO2: 34 mEq/L — ABNORMAL HIGH (ref 19–32)
Calcium: 9.1 mg/dL (ref 8.4–10.5)
Chloride: 103 mEq/L (ref 96–112)
Creatinine, Ser: 2.12 mg/dL — ABNORMAL HIGH (ref 0.40–1.50)
GFR: 30.96 mL/min — ABNORMAL LOW (ref >60.00)
Glucose, Bld: 78 mg/dL (ref 70–99)
Potassium: 3.6 mEq/L (ref 3.5–5.1)
Sodium: 143 mEq/L (ref 135–145)

## 2020-12-10 LAB — LIPID PANEL
Cholesterol: 132 mg/dL (ref 0–200)
HDL: 44.8 mg/dL (ref >39.00)
LDL Cholesterol: 72 mg/dL (ref 0–99)
NonHDL: 87.29
Total CHOL/HDL Ratio: 3
Triglycerides: 78 mg/dL (ref 0.0–149.0)
VLDL: 15.6 mg/dL (ref 0.0–40.0)

## 2020-12-10 LAB — CBC WITH DIFFERENTIAL/PLATELET
Basophils Absolute: 0.1 10*3/uL (ref 0.0–0.1)
Basophils Relative: 0.9 % (ref 0.0–3.0)
Eosinophils Absolute: 0 10*3/uL (ref 0.0–0.7)
Eosinophils Relative: 0.8 % (ref 0.0–5.0)
HCT: 41.6 % (ref 39.0–52.0)
Hemoglobin: 14.2 g/dL (ref 13.0–17.0)
Lymphocytes Relative: 41.7 % (ref 12.0–46.0)
Lymphs Abs: 2.5 10*3/uL (ref 0.7–4.0)
MCHC: 34.2 g/dL (ref 30.0–36.0)
MCV: 85.6 fl (ref 78.0–100.0)
Monocytes Absolute: 0.5 10*3/uL (ref 0.1–1.0)
Monocytes Relative: 8.8 % (ref 3.0–12.0)
Neutro Abs: 2.9 10*3/uL (ref 1.4–7.7)
Neutrophils Relative %: 47.8 % (ref 43.0–77.0)
Platelets: 275 10*3/uL (ref 150.0–400.0)
RBC: 4.85 Mil/uL (ref 4.22–5.81)
RDW: 14.5 % (ref 11.5–15.5)
WBC: 6 10*3/uL (ref 4.0–10.5)

## 2020-12-10 LAB — HEPATIC FUNCTION PANEL
ALT: 16 U/L (ref 0–53)
AST: 19 U/L (ref 0–37)
Albumin: 4.1 g/dL (ref 3.5–5.2)
Alkaline Phosphatase: 94 U/L (ref 39–117)
Bilirubin, Direct: 0.2 mg/dL (ref 0.0–0.3)
Total Bilirubin: 1 mg/dL (ref 0.2–1.2)
Total Protein: 6.9 g/dL (ref 6.0–8.3)

## 2020-12-10 LAB — URINALYSIS, ROUTINE W REFLEX MICROSCOPIC
Bilirubin Urine: NEGATIVE
Ketones, ur: NEGATIVE
Leukocytes,Ua: NEGATIVE
Nitrite: NEGATIVE
Specific Gravity, Urine: 1.015 (ref 1.000–1.030)
Total Protein, Urine: NEGATIVE
Urine Glucose: NEGATIVE
Urobilinogen, UA: 0.2 (ref 0.0–1.0)
pH: 6 (ref 5.0–8.0)

## 2020-12-10 LAB — TSH: TSH: 2.47 u[IU]/mL (ref 0.35–5.50)

## 2020-12-10 LAB — MICROALBUMIN / CREATININE URINE RATIO
Creatinine,U: 132.4 mg/dL
Microalb Creat Ratio: 2 mg/g (ref 0.0–30.0)
Microalb, Ur: 2.7 mg/dL — ABNORMAL HIGH (ref 0.0–1.9)

## 2020-12-10 LAB — VITAMIN B12: Vitamin B-12: 370 pg/mL (ref 211–911)

## 2020-12-10 LAB — PSA: PSA: 0.38 ng/mL (ref 0.10–4.00)

## 2020-12-10 LAB — VITAMIN D 25 HYDROXY (VIT D DEFICIENCY, FRACTURES): VITD: 49.22 ng/mL (ref 30.00–100.00)

## 2020-12-10 LAB — HEMOGLOBIN A1C: Hgb A1c MFr Bld: 5.9 % (ref 4.6–6.5)

## 2020-12-10 MED ORDER — ATORVASTATIN CALCIUM 40 MG PO TABS: 40.0000 mg | ORAL_TABLET | Freq: Every day | ORAL | 3 refills | Status: DC

## 2020-12-10 MED ORDER — AMLODIPINE BESYLATE 10 MG PO TABS
10.0000 mg | ORAL_TABLET | Freq: Every day | ORAL | 3 refills | Status: AC
Start: 2020-12-10 — End: ?

## 2020-12-10 MED ORDER — HYDROCODONE-ACETAMINOPHEN 5-325 MG PO TABS: 1.0000 | ORAL_TABLET | Freq: Four times a day (QID) | ORAL | 0 refills | Status: DC | PRN

## 2020-12-10 MED ORDER — COLCHICINE 0.6 MG PO TABS: 0.6000 mg | ORAL_TABLET | Freq: Every day | ORAL | 5 refills | Status: DC

## 2020-12-10 MED ORDER — ALLOPURINOL 100 MG PO TABS: 200.0000 mg | ORAL_TABLET | Freq: Every day | ORAL | 3 refills | Status: DC

## 2020-12-10 MED ORDER — METOPROLOL TARTRATE 50 MG PO TABS: 50.0000 mg | ORAL_TABLET | Freq: Two times a day (BID) | ORAL | 3 refills | Status: DC

## 2020-12-10 NOTE — Assessment & Plan Note (Signed)
BP Readings from Last 3 Encounters:  12/10/20 138/76  12/04/20 122/80  11/05/20 138/80   Stable, pt to continue medical treatment norvasc, lopressor

## 2020-12-10 NOTE — Assessment & Plan Note (Signed)
Pt to continue low chol diet, exercise and lipitor 40

## 2020-12-10 NOTE — Assessment & Plan Note (Addendum)
Mod to severe uncontrolled, For hydrocodone refill to last 3 mo, cont f/u with sports medicine

## 2020-12-10 NOTE — Assessment & Plan Note (Signed)
Lab Results  Component Value Date   HGBA1C 5.9 12/10/2020   Stable, pt to continue current medical treatment  - diet

## 2020-12-10 NOTE — Progress Notes (Signed)
Patient ID: [name], male   DOB: 1950/07/08, 70 y.o.   MRN: [id number]         Chief Complaint:: wellness exam and Follow-up  Chronic pain, and recent worsening CKD, htn, hld, vit d deficiency       HPI:  [name] is a 70 y.o. male here for wellness exam; declines covid booster and shingrix, o/w up to date with preventive referrals and immunizations                        Also c/o chronic persistent right knee pain with intermittent swelling, has been followed per sports medicine, has done well with infrequent use of hydrocodone for prn use only, last rx #30 lasting 3 mo.  No falls or giveaways and plans to f/u sports med soon.  Has seen renal last mo, felt to be overall stable per pt and has f/u appt at 6 mo.  Has been taking Vit D.  Pt denies chest pain, increased sob or doe, wheezing, orthopnea, PND, increased LE swelling, palpitations, dizziness or syncope.   Pt denies polydipsia, polyuria, or new focal neuro s/s.   Pt denies fever, wt loss, night sweats, loss of appetite, or other constitutional symptoms  No other new compalints  Trying to follow lower chol diet.   Wt Readings from Last 3 Encounters:  12/10/20 261 lb (118.4 kg)  12/04/20 265 lb (120.2 kg)  11/05/20 267 lb (121.1 kg)   BP Readings from Last 3 Encounters:  12/10/20 138/76  12/04/20 122/80  11/05/20 138/80   Immunization History  Administered Date(s) Administered   Fluad Quad(high Dose 65+) 02/21/2019, 05/12/2020   Influenza Whole 06/09/1999, 05/18/2007, 03/30/2010   Influenza, High Dose Seasonal PF 04/08/2016, 03/01/2017, 03/24/2018   Influenza,inj,Quad PF,6+ Mos 03/13/2015   Influenza-Unspecified 05/07/2014   PFIZER(Purple Top)SARS-COV-2 Vaccination 08/03/2019, 08/28/2019   Pneumococcal Conjugate-13 11/27/2015   Pneumococcal Polysaccharide-23 10/20/2016   Tdap 12/04/2013  There are no preventive care reminders to display for this patient.    Past Medical History:  Diagnosis Date   Anemia    normal Fe,  nl B12, nl retic, nl EPO July '13   Aortic regurgitation    Moderate AI 04/17/19 echo   Blood transfusion without reported diagnosis    BPH (benign prostatic hyperplasia)    Chronic back pain    Chronic kidney disease    CKD III, obstructive nephropathy   Diverticulosis    Dysuria    Elevated PSA, greater than or equal to 20 ng/ml June '13   PSA 107   Hemorrhoids, internal, with bleeding, prolapse 09/19/2014   Hyperlipidemia 05/29/2014   Hypertension    Obstructive uropathy 11/27/2015   Pre-diabetes    per patient "reduced sugar intake"   Renal insufficiency    Tuberculosis    h/o PPD +   UTI (urinary tract infection)    Vitamin D deficiency 09/13/2017   Past Surgical History:  Procedure Laterality Date   CARPAL TUNNEL RELEASE Right    COLONOSCOPY     HEMORRHOID BANDING     INSERTION OF MESH N/A 10/17/2019   Procedure: Insertion Of Mesh;  Surgeon: Ralene Ok, MD;  Location: [facility];  Service: General;  Laterality: N/A;   SPLENECTOMY     XI ROBOTIC ASSISTED SIMPLE PROSTATECTOMY N/A 03/09/2019   Procedure: XI ROBOTIC ASSISTED SIMPLE PROSTATECTOMY;  Surgeon: Cleon Gustin, MD;  Location: [facility];  Service: Urology;  Laterality: N/A;   XI ROBOTIC ASSISTED VENTRAL HERNIA  N/A 10/17/2019   Procedure: XI ROBOTIC ASSISTED INCISIONAL HERNIA REPAIR WITH MESH;  Surgeon: Ralene Ok, MD;  Location: [facility];  Service: General;  Laterality: N/A;    reports that he quit smoking about 40 years ago. His smoking use included cigarettes. He has never used smokeless tobacco. He reports that he does not drink alcohol and does not use drugs. family history includes Diabetes in his mother; Hearing loss in his brother; Stroke in his brother. No Known Allergies Current Outpatient Medications on File Prior to Visit  Medication Sig Dispense Refill   acetaminophen (TYLENOL) 500 MG tablet Take 1,000 mg by mouth every 6 (six) hours as needed for moderate pain.      aspirin EC 81 MG tablet Take 81 mg  by mouth daily.     cholecalciferol (VITAMIN D3) 25 MCG (1000 UNIT) tablet Take 1,000 Units by mouth daily.     hydroxypropyl methylcellulose / hypromellose (ISOPTO TEARS / GONIOVISC) 2.5 % ophthalmic solution Place 1-2 drops into both eyes 3 (three) times daily as needed (dry/irritated eyes.). 15 mL 2   lactulose (CHRONULAC) 10 GM/15ML solution Take 30 mLs (20 g total) by mouth daily. (Patient taking differently: Take 20 g by mouth daily. Take 15-30 ml's daily) 946 mL 2   meclizine (ANTIVERT) 25 MG tablet Take 1 tablet (25 mg total) by mouth 2 (two) times daily as needed for up to 20 doses for dizziness. 20 tablet 0   Multiple Vitamin (MULTIVITAMIN WITH MINERALS) TABS tablet Take 1 tablet by mouth daily.     MYRBETRIQ 25 MG TB24 tablet Take 25 mg by mouth daily.     Potassium 99 MG TABS Take 99 mg by mouth daily.      traMADol (ULTRAM) 50 MG tablet TAKE 1 TABLET BY MOUTH EVERY 6 HOURS AS NEEDED 30 tablet 0   No current facility-administered medications on file prior to visit.        ROS:  All others reviewed and negative.  Objective        PE:  BP 138/76 (BP Location: Left Arm, Patient Position: Sitting, Cuff Size: Large)   Pulse (!) 52   Ht 6' (1.829 m)   Wt 261 lb (118.4 kg)   SpO2 98%   BMI 35.40 kg/m                 Constitutional: Pt appears in NAD               HENT: Head: NCAT.                Right Ear: External ear normal.                 Left Ear: External ear normal.                Eyes: . Pupils are equal, round, and reactive to light. Conjunctivae and EOM are normal               Nose: without d/c or deformity               Neck: Neck supple. Gross normal ROM               Cardiovascular: Normal rate and regular rhythm.                 Pulmonary/Chest: Effort normal and breath sounds without rales or wheezing.                Abd:  Soft, NT, ND, + BS, no organomegaly               Neurological: Pt is alert. At baseline orientation, motor grossly intact                Skin: Skin is warm. No rashes, no other new lesions, LE edema - none               Psychiatric: Pt behavior is normal without agitation   Micro: none  Cardiac tracings I have personally interpreted today:  none  Pertinent Radiological findings (summarize): none   Lab Results  Component Value Date   WBC 6.0 12/10/2020   HGB 14.2 12/10/2020   HCT 41.6 12/10/2020   PLT 275.0 12/10/2020   GLUCOSE 78 12/10/2020   CHOL 132 12/10/2020   TRIG 78.0 12/10/2020   HDL 44.80 12/10/2020   LDLCALC 72 12/10/2020   ALT 16 12/10/2020   AST 19 12/10/2020   NA 143 12/10/2020   K 3.6 12/10/2020   CL 103 12/10/2020   CREATININE 2.12 (H) 12/10/2020   BUN 27 (H) 12/10/2020   CO2 34 (H) 12/10/2020   TSH 2.47 12/10/2020   PSA 0.38 12/10/2020   INR 1.2 12/05/2019   HGBA1C 5.9 12/10/2020   MICROALBUR 2.7 (H) 12/10/2020   Assessment/Plan:  Froylan Hobby is a 70 y.o. Black or African American [2] male with  has a past medical history of Anemia, Aortic regurgitation, Blood transfusion without reported diagnosis, BPH (benign prostatic hyperplasia), Chronic back pain, Chronic kidney disease, Diverticulosis, Dysuria, Elevated PSA, greater than or equal to 20 ng/ml (June '13), Hemorrhoids, internal, with bleeding, prolapse (09/19/2014), Hyperlipidemia (05/29/2014), Hypertension, Obstructive uropathy (11/27/2015), Pre-diabetes, Renal insufficiency, Tuberculosis, UTI (urinary tract infection), and Vitamin D deficiency (09/13/2017).  Vitamin D deficiency Last vitamin D Lab Results  Component Value Date   VD25OH 66.35 06/12/2020   Stable, cont oral replacement   Encounter for well adult exam with abnormal findings Age and sex appropriate education and counseling updated with regular exercise and diet Referrals for preventative services - none needed Immunizations addressed - declines covid booster and shingrix Smoking counseling  - none needed Evidence for depression or other mood disorder - none  significant Most recent labs reviewed. I have personally reviewed and have noted: 1) the patient's medical and social history 2) The patient's current medications and supplements 3) The patient's height, weight, and BMI have been recorded in the chart   Abnormal glucose Lab Results  Component Value Date   HGBA1C 5.9 12/10/2020   Stable, pt to continue current medical treatment  - diet   Aortic atherosclerosis ([place]) Pt to continue low chol diet, exercise and lipitor 40  Chronic pain of right knee Mod to severe uncontrolled, For hydrocodone refill to last 3 mo, cont f/u with sports medicine  CKD (chronic kidney disease) stage 3, GFR 30-59 ml/min ([facility]) Mod to severe but stable,  Lab Results  Component Value Date   CREATININE 2.12 (H) 12/10/2020   For renal f/u q 6 mo  Essential hypertension BP Readings from Last 3 Encounters:  12/10/20 138/76  12/04/20 122/80  11/05/20 138/80   Stable, pt to continue medical treatment norvasc, lopressor   Hyperlipidemia Lab Results  Component Value Date   LDLCALC 72 12/10/2020   Mild uncontrolled, goal ldl < 70, pt to continue current statin lipitor 40 and lower chol diet Followup: No follow-ups on file.  Cathlean Cower, MD 12/10/2020 7:57 PM [facility]

## 2020-12-10 NOTE — Assessment & Plan Note (Signed)
Last vitamin D Lab Results  Component Value Date   VD25OH 66.35 06/12/2020   Stable, cont oral replacement

## 2020-12-10 NOTE — Assessment & Plan Note (Signed)
Age and sex appropriate education and counseling updated with regular exercise and diet Referrals for preventative services - none needed Immunizations addressed - declines covid booster and shingrix Smoking counseling  - none needed Evidence for depression or other mood disorder - none significant Most recent labs reviewed. I have personally reviewed and have noted: 1) the patient's medical and social history 2) The patient's current medications and supplements 3) The patient's height, weight, and BMI have been recorded in the chart  

## 2020-12-10 NOTE — Patient Instructions (Signed)
Please continue all other medications as before, and refills have been done if requested including the hydrocodone that should last about 3 months  Please have the pharmacy call with any other refills you may need.  Please continue your efforts at being more active, low cholesterol diet, and weight control.  You are otherwise up to date with prevention measures today.  Please keep your appointments with your specialists as you may have planned - kidney doctor every 6 months  Please go to the LAB at the blood drawing area for the tests to be done  You will be contacted by phone if any changes need to be made immediately.  Otherwise, you will receive a letter about your results with an explanation, but please check with MyChart first.  Please remember to sign up for MyChart if you have not done so, as this will be important to you in the future with finding out test results, communicating by private email, and scheduling acute appointments online when needed.  Please make an Appointment to return in 6 months, or sooner if needed

## 2020-12-10 NOTE — Assessment & Plan Note (Signed)
Lab Results  Component Value Date   LDLCALC 72 12/10/2020   Mild uncontrolled, goal ldl < 70, pt to continue current statin lipitor 40 and lower chol diet

## 2020-12-10 NOTE — Assessment & Plan Note (Signed)
Mod to severe but stable,  Lab Results  Component Value Date   CREATININE 2.12 (H) 12/10/2020   For renal f/u q 6 mo

## 2020-12-15 ENCOUNTER — Ambulatory Visit (INDEPENDENT_AMBULATORY_CARE_PROVIDER_SITE_OTHER): Payer: Medicare Other | Admitting: *Deleted

## 2020-12-15 DIAGNOSIS — I10 Essential (primary) hypertension: Secondary | ICD-10-CM | POA: Diagnosis not present

## 2020-12-15 DIAGNOSIS — N183 Chronic kidney disease, stage 3 unspecified: Secondary | ICD-10-CM | POA: Diagnosis not present

## 2020-12-15 NOTE — Chronic Care Management (AMB) (Signed)
Chronic Care Management   CCM RN Visit Note  12/15/2020 Name: [name] MRN: [id number] DOB: 04-Apr-1951  Subjective: [name] is a 70 y.o. year old male who is a primary care patient of Biagio Borg, MD. The care management team was consulted for assistance with disease management and care coordination needs.    Engaged with patient by telephone for follow up visit in response to provider referral for case management and/or care coordination services.   Consent to Services:  The patient was given information about Chronic Care Management services, agreed to services, and gave verbal consent prior to initiation of services.  Please see initial visit note for detailed documentation.  Patient agreed to services and verbal consent obtained.   Assessment: Review of patient past medical history, allergies, medications, health status, including review of consultants reports, laboratory and other test data, was performed as part of comprehensive evaluation and provision of chronic care management services.   SDOH (Social Determinants of Health) assessments and interventions performed:  SDOH Interventions    Flowsheet Row Most Recent Value  SDOH Interventions   Food Insecurity Interventions Intervention Not Indicated  [Confirmed he spoke with care guide in March 2022,  denies ongoing resource needs]  Financial Strain Interventions Other (Comment)  [Confirmed he spoke with care guide in March 2022,  denies ongoing resource needs]  Housing Interventions Intervention Not Indicated  [confirmed he spoke with care guide in March 2022]  Transportation Interventions Intervention Not Indicated  [Reports he uses roommates vehicle and also walks to appointments if necessary,  denies ongoing transportation needs today]       CCM Care Plan  No Known Allergies  Outpatient Encounter Medications as of 12/15/2020  Medication Sig Note   acetaminophen (TYLENOL) 500 MG tablet Take 1,000 mg by mouth  every 6 (six) hours as needed for moderate pain.     allopurinol (ZYLOPRIM) 100 MG tablet Take 2 tablets (200 mg total) by mouth daily.    amLODipine (NORVASC) 10 MG tablet Take 1 tablet (10 mg total) by mouth daily.    aspirin EC 81 MG tablet Take 81 mg by mouth daily.    atorvastatin (LIPITOR) 40 MG tablet Take 1 tablet (40 mg total) by mouth daily.    cholecalciferol (VITAMIN D3) 25 MCG (1000 UNIT) tablet Take 1,000 Units by mouth daily.    colchicine 0.6 MG tablet Take 1 tablet (0.6 mg total) by mouth daily.    HYDROcodone-acetaminophen (NORCO/VICODIN) 5-325 MG tablet Take 1 tablet by mouth every 6 (six) hours as needed.    hydroxypropyl methylcellulose / hypromellose (ISOPTO TEARS / GONIOVISC) 2.5 % ophthalmic solution Place 1-2 drops into both eyes 3 (three) times daily as needed (dry/irritated eyes.). 08/07/2020: 08/07/20: Reports uses prn   lactulose (CHRONULAC) 10 GM/15ML solution Take 30 mLs (20 g total) by mouth daily. (Patient taking differently: Take 20 g by mouth daily. Take 15-30 ml's daily)    meclizine (ANTIVERT) 25 MG tablet Take 1 tablet (25 mg total) by mouth 2 (two) times daily as needed for up to 20 doses for dizziness. 08/07/2020: 08/07/20: Patient reports taking prn; has not needed recently   metoprolol tartrate (LOPRESSOR) 50 MG tablet Take 1 tablet (50 mg total) by mouth 2 (two) times daily.    Multiple Vitamin (MULTIVITAMIN WITH MINERALS) TABS tablet Take 1 tablet by mouth daily.    MYRBETRIQ 25 MG TB24 tablet Take 25 mg by mouth daily. 08/07/2020: 08/07/20: Patient reports he is not currently taking   Potassium  99 MG TABS Take 99 mg by mouth daily.     traMADol (ULTRAM) 50 MG tablet TAKE 1 TABLET BY MOUTH EVERY 6 HOURS AS NEEDED    No facility-administered encounter medications on file as of 12/15/2020.    Patient Active Problem List   Diagnosis Date Noted   Aortic atherosclerosis ([place]) 12/10/2020   Chronic pain of right knee 12/10/2020   Bilateral elbow joint pain  07/09/2020   Acute renal failure superimposed on chronic kidney disease ([place]) 11/12/2019   Dry eyes 11/12/2019   S/P hernia repair 10/17/2019   Abdominal pain 07/06/2019   BPH with obstruction/lower urinary tract symptoms 03/09/2019   Clot retention of urine 02/11/2019   Vitamin D deficiency 09/13/2017   Urinary tract infection without hematuria 07/23/2017   Low back pain 07/03/2017   Dizziness 06/30/2017   Acute gouty arthritis 12/01/2016   Degenerative arthritis of knee, bilateral 11/18/2016   Urinary retention due to benign prostatic hyperplasia 10/24/2016   Mass of right side of neck 10/20/2016   Gout 10/20/2016   Right knee pain 10/12/2016   Hypokalemia 05/28/2016   Encounter for well adult exam with abnormal findings 11/27/2015   Obstructive uropathy 11/27/2015   Elevated PSA 11/27/2015   Acute pyelonephritis 05/27/2015   Generalized bloating 05/27/2015   Chronic constipation 05/27/2015   Chest pain 05/27/2015   AKI (acute kidney injury) ([place]) 05/27/2015   Acute sinus infection 11/22/2014   Cough 09/25/2014   Hemorrhoids, internal, with bleeding, prolapse 09/19/2014   Chronic UTI 05/29/2014   Hyperlipidemia 05/29/2014   Lumbar radiculopathy 05/23/2014   Lumbar stenosis 11/20/2013   Abnormal glucose 11/06/2013   Unspecified hereditary and idiopathic peripheral neuropathy 01/22/2013   Cardiomyopathy due to hypertension ([place]) 12/28/2011   CKD (chronic kidney disease) stage 3, GFR 30-59 ml/min ([facility]CC) 11/24/2011   Hematuria 11/24/2011   Hydronephrosis 11/24/2011   Anemia 11/24/2011   BRADYCARDIA 02/05/2010   TINEA PEDIS 01/29/2010   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   PERIPHERAL EDEMA 01/29/2010   ABNORMAL ELECTROCARDIOGRAM 01/29/2010   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   Osteoarthrosis, generalized, 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   ONYCHOMYCOSIS, TOENAILS 10/02/2007   BPH (benign prostatic hyperplasia) 10/02/2007   Essential hypertension 03/06/2007    Conditions  to be addressed/monitored:  HTN and CKD Stage III  Care Plan : Chronic Kidney (Adult)  Updates made by Knox Royalty, RN since 12/15/2020 12:00 AM     Problem: Disease Progression   Priority: Medium     Long-Range Goal: Disease Progression Prevented or Minimized   Start Date: 08/07/2020  Expected End Date: 02/07/2021  This Visit's Progress: On track  Recent Progress: On track  Priority: Medium  Note:   Current Barriers:  Knowledge Deficits related to dietary management of CKD Care Coordination needs related to resource needs for rent/housing costs and food acquisition in a patient with CKD: patient confirmed resources provided with previous referral placed 08/07/20 Chronic Disease Management support and education needs related to self-health management of CKD Unable to independently verbalize strategies for dietary management of CKD: continues to require ongoing reinforcement Nurse Case Manager Clinical Goal(s):  12/15/20: goal combined with HTN care plan Over the next 6 months, patient will: work with RN CM to address needs related to education and information for self-health management of CKD verbalize basic understanding of CKD disease process and self health management plan as evidenced by patient verbalization of same, including appropriate foods to eat and avoid in in setting of CKD work with care guide to obtain  resources for food acquisition and rent/ housing costs: confirmed goal met 09/09/20 Interventions:  1:1 collaboration with Biagio Borg, MD regarding development and update of comprehensive plan of care as evidenced by provider attestation and co-signature Inter-disciplinary care team collaboration (see longitudinal plan of care) Chart reviewed including relevant office notes, upcoming scheduled appointments, and lab results Evaluation of current treatment plan related to CKD and patient's adherence to plan as established by provider Discussed current  clinical condition  with patient and confirmed no current clinical or medication concerns; reviewed with patient recent ("a few weeks ago") renal provider appointment: reports "everything is stable; they are just monitoring my lab work to make sure my kidney function is okay;" reports plans for ongoing follow up with renal provider- not sure exactly date of next appointment, but states "it is scheduled." Confirmed that he received and reviewed previously mailed printed education around renal diet: denies questions, "trying to follow," reports overall good adherence; encouraged patient to continue to review as needed; declines need for additional printed education today Denies recent medication changes/ concerns; confirms continues to self manage independently  Reinforced previously provided education to patient re: appropriate foods to eat and to avoid in setting of CKD: patient will require periodic/ ongoing reinforcement Discussed plans with patient for ongoing care management follow up and provided patient with direct contact information for care management team Patient Goals/Self-Care Activities: I am glad you went to visit Dr. Justin Mend recently and were told your kidney function is stable- stay in touch with Dr. Justin Mend regularly Use the resources were provided to you in March 2022 to help you with affording healthy foods, if you find yourself in need of assistance Choose foods that are low in sodium (salt) Eat 2 servings of fruit/vegetables every day Keep a food diary if you have trouble remembering what you eat, or to guide you in your choices Prepare or eat main meal at home 3 to 5 days each week Keep healthy snacks on hand Read food labels for sodium (salt), fat and sugar content Watch for swelling in feet, ankles and legs every day  Read over the information about good and bad food choices in kidney disease and start making small gradual changes to your diet: please review the information sent to you as often as you  need to Talk to your doctors about whether a referral to a nutritionist would be helpful for you Follow Up Plan:  The patient has been provided with contact information for the care management team and has been advised to call with any health related questions or concerns.         Care Plan : Hypertension (Adult)  Updates made by Knox Royalty, RN since 12/15/2020 12:00 AM     Problem: Hypertension (Hypertension)   Priority: Medium     Long-Range Goal: Hypertension Monitored   Start Date: 08/07/2020  Expected End Date: 12/15/2021  This Visit's Progress: On track  Recent Progress: On track  Priority: Medium  Note:   Objective:  Last practice recorded BP readings:  BP Readings from Last 3 Encounters:  07/25/20 122/82  07/23/20 (!) 144/76  07/09/20 122/72  Most recent eGFR/CrCl: No results found for: EGFR  No components found for: CRCL Current Barriers:  Knowledge Deficits related to basic understanding of hypertension pathophysiology in setting of CKD- could benefit from ongoing reinforcement Financial Constraints- care guide referral placed for resources around food acquisition and housing/ rent costs: confirmed resource information provided by care guide (referral placed  08/07/20) Unable to independently verbalize blood pressure readings at home; does not regularly monitor blood pressures Case Manager Clinical Goal(s):  Goal re-established, extended/ modified 12/15/20 Over the next 12 months, patient will demonstrate ongoing good adherence to prescribed treatment plan for hypertension and CKD as evidenced by patient reporting during CCM RN CM outreach of: Taking all medications as prescribed,  Monitoring and recording blood pressure weekly,  Adhering to low sodium/DASH/ heart healthy/ renal diet Attending all provider appointments Contacting care providers promptly for any new concerns, issues, or problems that arise Interventions:  Collaboration with Biagio Borg, MD  regarding development and update of comprehensive plan of care as evidenced by provider attestation and co-signature Inter-disciplinary care team collaboration (see longitudinal plan of care) UNABLE to independently: verbalize role of blood pressure management in setting of CKD progression: continues to require ongoing reinforcement Confirmed no current clinical concerns around HTN management: reports recent leg/ ankle swelling swelling related to ongoing orthopedic issue with knee: today he reports he has attended all recent orthopedic/ sports medicine provider appointments and acknowledges that he has been told that he most likely needs knee replacements; reports he would like to avoid this; states he believes that his knees are "okay" and that he will not need TKR Denies recent falls- continues to occasionally use cane Reviewed recent provider appointments with patient: confirmed no recent medication changes, continues to manage independently at home Reinforced previously provided education to patient re: DASH diet, role blood pressure plays in management of CKD, dietary management of concurrent CKD/ HTN; confirms he has received printed educational material- denies questions today; declines desire for additional printed educational material: states that he is hoping to go to [place] "soon" to visit his spouse and may not be in the country to receive mail Confirmed patient continues intermittently monitoring/ recording blood pressure weekly, has still not been writing down consistently: encouraged him to begin recording blood pressures; explained this information will help PCP manage medications; patient denies today previously reported episodes of dizziness; states "all my blood pressures that I have taken are real good;" denies concerns/ signs/ symptoms low- and high blood pressure today Reviewed signs/ symptoms MI/ CVA along with corresponding action plan; reiterated previously provided education around  complications HTN especially in setting of concurrent CKD Confirmed no upcoming scheduled provider appointments, per patient due to "everything is stable and I am hoping to go visit my wife in [place] soon" Discussed plans with patient for ongoing care management follow up and provided patient with direct contact information for care management team Patient Goals: Continue to check blood pressure weekly-- please start writing down your blood pressures each time you check it: this will help your doctors know if you are on the right medications The blood pressures you reported today are all in good range Choose a place to take my blood pressure (home, clinic or office, retail store) Write blood pressure results in a log or diary so we can review these together each time we talk If you experience ongoing or recurrent symptoms of dizziness, or it gets worse or increases in frequency-- please let your doctor know Continue to take your medications as prescribed Continue to get good exercise like walking when you are able Continue to follow a low-salt, heart healthy diet Continue your regular follow up appointments with your kidney doctor, Dr. Justin Mend: over time, high blood pressure can affect your kidney function Self-Care Activities: Attends all scheduled provider appointments Calls provider office for new concerns and questions Attempts  to follow a low sodium diet/DASH diet Follow Up Plan:  Telephone follow up appointment with care management team member scheduled for: Monday March 02, 2021 at 9:00 am The patient has been provided with contact information for the care management team and has been advised to call with any health related questions or concerns.      Plan: Telephone follow up appointment with care management team member scheduled for:  Monday March 02, 2021 at 9:00 am The patient has been provided with contact information for the care management team and has been advised to call  with any health related questions or concerns  [name], RN, BSN, [place] [phone]: direct office [phone]: mobile

## 2020-12-15 NOTE — Patient Instructions (Signed)
Visit Information  Tylen, it was nice talking with you today   Please read over the attached information, and keep up the great work taking your medications as prescribed, attending all doctor appointments, and following a healthy diet to manage your blood pressure and kidney disease    I look forward to talking to you again for an update on Monday, March 02, 2021 at 9:00 am- please be listening out for my call that day.  I will call as close to 9:00 am as possible; I look forward to hearing about your progress.   Please don't hesitate to contact me if I can be of assistance to you before our next scheduled appointment.   [name], RN, BSN, [facility] RN Care Coordination- [place] [phone]: direct office [phone]: mobile    PATIENT GOALS:  Goals Addressed             This Visit's Progress    COMPLETED: Manage My Diet for kidney disease   On track    Timeframe:  Long-Range Goal Priority:  Medium Start Date:          08/07/20                   Expected End Date:   02/07/21                    Follow Up Date 12/15/20   I am glad you went to visit Dr. Justin Mend recently and were told your kidney function is stable- stay in touch with Dr. Justin Mend regularly Use the resources were provided to you in March 2022 to help you with affording healthy foods, if you find yourself in need of assistance Choose foods that are low in sodium (salt) Eat 2 servings of fruit/vegetables every day Keep a food diary if you have trouble remembering what you eat, or to guide you in your choices Prepare or eat main meal at home 3 to 5 days each week Keep healthy snacks on hand Read food labels for sodium (salt), fat and sugar content Watch for swelling in feet, ankles and legs every day  Read over the information about good and bad food choices in kidney disease and start making small gradual changes to your diet: please review the information sent to you as often  as you need to Talk to your doctors about whether a referral to a nutritionist would be helpful for you   Why is this important?   A healthy diet is important for mental and physical health.  Healthy food helps repair damaged body tissue and maintains strong bones and muscles.  No single food is just right so eating a variety of proteins, fruits, vegetables and grains is best.  You may need to change what you eat or drink to manage kidney disease.  A dietitian is the best person to guide you.           Track and Manage My Blood Pressure-Hypertension   On track    Timeframe:  Long-Range Goal Priority:  Medium Start Date:        08/07/20                     Expected End Date:       12/15/21- goal re-established/ extended  12/15/20              Follow Up Date 03/02/21   Continue to check blood pressure  weekly-- please start writing down your blood pressures each time you check it: this will help your doctors know if you are on the right medications The blood pressures you reported today are all in good range Choose a place to take my blood pressure (home, clinic or office, retail store) Write blood pressure results in a log or diary so we can review these together each time we talk If you experience ongoing or recurrent symptoms of dizziness, or it gets worse or increases in frequency-- please let your doctor know Continue to take your medications as prescribed Continue to get good exercise like walking when you are able Continue to follow a low-salt, heart healthy diet Continue your regular follow up appointments with your kidney doctor, Dr. Justin Mend: over time, high blood pressure can affect your kidney function   Why is this important?   You won't feel high blood pressure, but it can still hurt your blood vessels.  High blood pressure can cause heart or kidney problems. It can also cause a stroke.  Making lifestyle changes like losing a little weight or eating less salt will help.   Checking your blood pressure at home and at different times of the day can help to control blood pressure.  If the doctor prescribes medicine remember to take it the way the doctor ordered.  Call the office if you cannot afford the medicine or if there are questions about it.             The patient verbalized understanding of instructions, educational materials, and care plan provided today and declined offer to receive copy of patient instructions, educational materials, and care plan- reports will be traveling to [place] "soon" and will not be able to accept mail Telephone follow up appointment with care management team member scheduled for:  Monday, March 02, 2021 at 9:00 am The patient has been provided with contact information for the care management team and has been advised to call with any health related questions or concerns.   [name], RN, BSN, [facility] RN Care Coordination- [place] [phone]: direct office [phone]: mobile

## 2021-01-27 ENCOUNTER — Other Ambulatory Visit: Payer: Self-pay | Admitting: Internal Medicine

## 2021-02-21 ENCOUNTER — Emergency Department (HOSPITAL_COMMUNITY)
Admission: EM | Admit: 2021-02-21 | Discharge: 2021-02-21 | Disposition: A | Discharge status: Home / Self Care | Payer: Medicare Other | Attending: Emergency Medicine | Admitting: Emergency Medicine

## 2021-02-21 ENCOUNTER — Encounter (HOSPITAL_COMMUNITY): Payer: Self-pay

## 2021-02-21 ENCOUNTER — Other Ambulatory Visit: Payer: Self-pay

## 2021-02-21 ENCOUNTER — Emergency Department (HOSPITAL_COMMUNITY): Payer: Medicare Other

## 2021-02-21 DIAGNOSIS — Z87891 Personal history of nicotine dependence: Secondary | ICD-10-CM | POA: Insufficient documentation

## 2021-02-21 DIAGNOSIS — Z79899 Other long term (current) drug therapy: Secondary | ICD-10-CM | POA: Insufficient documentation

## 2021-02-21 DIAGNOSIS — I129 Hypertensive chronic kidney disease with stage 1 through stage 4 chronic kidney disease, or unspecified chronic kidney disease: Secondary | ICD-10-CM | POA: Diagnosis not present

## 2021-02-21 DIAGNOSIS — D631 Anemia in chronic kidney disease: Secondary | ICD-10-CM | POA: Insufficient documentation

## 2021-02-21 DIAGNOSIS — Z7982 Long term (current) use of aspirin: Secondary | ICD-10-CM | POA: Diagnosis not present

## 2021-02-21 DIAGNOSIS — N183 Chronic kidney disease, stage 3 unspecified: Secondary | ICD-10-CM | POA: Diagnosis not present

## 2021-02-21 DIAGNOSIS — W010XXA Fall on same level from slipping, tripping and stumbling without subsequent striking against object, initial encounter: Secondary | ICD-10-CM | POA: Diagnosis not present

## 2021-02-21 DIAGNOSIS — M79642 Pain in left hand: Secondary | ICD-10-CM | POA: Diagnosis not present

## 2021-02-21 DIAGNOSIS — M79645 Pain in left finger(s): Secondary | ICD-10-CM | POA: Diagnosis not present

## 2021-02-21 DIAGNOSIS — M25532 Pain in left wrist: Secondary | ICD-10-CM | POA: Diagnosis not present

## 2021-02-21 MED ORDER — OXYCODONE HCL 5 MG PO TABS: 5.0000 mg | ORAL_TABLET | ORAL | 0 refills | Status: DC | PRN

## 2021-02-21 NOTE — ED Provider Notes (Signed)
[name]   CSN: [id number] Arrival date & time: 02/21/21  1049     History Chief Complaint  Patient presents with   Fall   Hand Injury    [name] is a 70 y.o. male who presents with concern for left thumb pain 48 hours after [employer].  Tripped on uneven surface of the sidewalk.  Has not been seen in the interval since his injury.  Denies any numbness, tingling, weakness in the hand but is endorsing significant swelling to the thumb and the dorsum of the hand.  Treated with Tylenol with some relief.  No pain higher up in the elbow, no history of injury to the hand.  I personally reviewed this patient's medical records.  He has history of hypertension, chronic kidney disease, BPH, tuberculosis, and aortic regurg.  He is not anticoagulated.  HPI     Past Medical History:  Diagnosis Date   Anemia    normal Fe, nl B12, nl retic, nl EPO July '13   Aortic regurgitation    Moderate AI 04/17/19 echo   Blood transfusion without reported diagnosis    BPH (benign prostatic hyperplasia)    Chronic back pain    Chronic kidney disease    CKD III, obstructive nephropathy   Diverticulosis    Dysuria    Elevated PSA, greater than or equal to 20 ng/ml June '13   PSA 107   Hemorrhoids, internal, with bleeding, prolapse 09/19/2014   Hyperlipidemia 05/29/2014   Hypertension    Obstructive uropathy 11/27/2015   Pre-diabetes    per patient "reduced sugar intake"   Renal insufficiency    Tuberculosis    h/o PPD +   UTI (urinary tract infection)    Vitamin D deficiency 09/13/2017    Patient Active Problem List   Diagnosis Date Noted   Aortic atherosclerosis ([place]) 12/10/2020   Chronic pain of right knee 12/10/2020   Bilateral elbow joint pain 07/09/2020   Acute renal failure superimposed on chronic kidney disease ([place]) 11/12/2019   Dry eyes 11/12/2019   S/P hernia repair 10/17/2019   Abdominal pain 07/06/2019   BPH with  obstruction/lower urinary tract symptoms 03/09/2019   Clot retention of urine 02/11/2019   Vitamin D deficiency 09/13/2017   Urinary tract infection without hematuria 07/23/2017   Low back pain 07/03/2017   Dizziness 06/30/2017   Acute gouty arthritis 12/01/2016   Degenerative arthritis of knee, bilateral 11/18/2016   Urinary retention due to benign prostatic hyperplasia 10/24/2016   Mass of right side of neck 10/20/2016   Gout 10/20/2016   Right knee pain 10/12/2016   Hypokalemia 05/28/2016   Encounter for well adult exam with abnormal findings 11/27/2015   Obstructive uropathy 11/27/2015   Elevated PSA 11/27/2015   Acute pyelonephritis 05/27/2015   Generalized bloating 05/27/2015   Chronic constipation 05/27/2015   Chest pain 05/27/2015   AKI (acute kidney injury) ([place]) 05/27/2015   Acute sinus infection 11/22/2014   Cough 09/25/2014   Hemorrhoids, internal, with bleeding, prolapse 09/19/2014   Chronic UTI 05/29/2014   Hyperlipidemia 05/29/2014   Lumbar radiculopathy 05/23/2014   Lumbar stenosis 11/20/2013   Abnormal glucose 11/06/2013   Unspecified hereditary and idiopathic peripheral neuropathy 01/22/2013   Cardiomyopathy due to hypertension ([place]) 12/28/2011   CKD (chronic kidney disease) stage 3, GFR 30-59 ml/min ([facility]) 11/24/2011   Hematuria 11/24/2011   Hydronephrosis 11/24/2011   Anemia 11/24/2011   BRADYCARDIA 02/05/2010   TINEA PEDIS 01/29/2010   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   PERIPHERAL EDEMA 01/29/2010   ABNORMAL ELECTROCARDIOGRAM 01/29/2010   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   Osteoarthrosis, generalized, 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   ONYCHOMYCOSIS, TOENAILS 10/02/2007   BPH (benign prostatic hyperplasia) 10/02/2007   Essential hypertension 03/06/2007    Past Surgical History:  Procedure Laterality Date   CARPAL TUNNEL RELEASE Right    COLONOSCOPY     HEMORRHOID BANDING     INSERTION OF MESH N/A 10/17/2019   Procedure: Insertion Of Mesh;   Surgeon: Ralene Ok, MD;  Location: [facility];  Service: General;  Laterality: N/A;   SPLENECTOMY     XI ROBOTIC ASSISTED SIMPLE PROSTATECTOMY N/A 03/09/2019   Procedure: XI ROBOTIC ASSISTED SIMPLE PROSTATECTOMY;  Surgeon: Cleon Gustin, MD;  Location: [facility];  Service: Urology;  Laterality: N/A;   XI ROBOTIC ASSISTED VENTRAL HERNIA N/A 10/17/2019   Procedure: XI ROBOTIC ASSISTED INCISIONAL HERNIA REPAIR WITH MESH;  Surgeon: Ralene Ok, MD;  Location: [facility];  Service: General;  Laterality: N/A;       Family History  Problem Relation Age of Onset   Diabetes Mother    Stroke Brother    Hearing loss Brother    Colon cancer Neg Hx    Rectal cancer Neg Hx    Stomach cancer Neg Hx    Esophageal cancer Neg Hx     Social History   Tobacco Use   Smoking status: Former    Types: Cigarettes    Quit date: 02/26/1980    Years since quitting: 41.0   Smokeless tobacco: Never  Vaping Use   Vaping Use: Never used  Substance Use Topics   Alcohol use: No    Alcohol/week: 0.0 standard drinks   Drug use: No    Home Medications Prior to Admission medications   Medication Sig Start Date End Date Taking? Authorizing Provider  acetaminophen (TYLENOL) 500 MG tablet Take 1,000 mg by mouth every 6 (six) hours as needed for moderate pain.     [provider]  allopurinol (ZYLOPRIM) 100 MG tablet Take 2 tablets (200 mg total) by mouth daily. 12/10/20   Biagio Borg, MD  amLODipine (NORVASC) 10 MG tablet Take 1 tablet (10 mg total) by mouth daily. 12/10/20   Biagio Borg, MD  aspirin EC 81 MG tablet Take 81 mg by mouth daily.    [provider]  atorvastatin (LIPITOR) 40 MG tablet Take 1 tablet (40 mg total) by mouth daily. 12/10/20   Biagio Borg, MD  cholecalciferol (VITAMIN D3) 25 MCG (1000 UNIT) tablet Take 1,000 Units by mouth daily.    [provider]  colchicine 0.6 MG tablet Take 1 tablet (0.6 mg total) by mouth daily. 12/10/20   Biagio Borg, MD   HYDROcodone-acetaminophen (NORCO/VICODIN) 5-325 MG tablet Take 1 tablet by mouth every 6 (six) hours as needed. 12/10/20   Biagio Borg, MD  hydroxypropyl methylcellulose / hypromellose (ISOPTO TEARS / GONIOVISC) 2.5 % ophthalmic solution Place 1-2 drops into both eyes 3 (three) times daily as needed (dry/irritated eyes.). 11/12/19   Biagio Borg, MD  lactulose (CHRONULAC) 10 GM/15ML solution Take 30 mLs (20 g total) by mouth daily. Patient taking differently: Take 20 g by mouth daily. Take 15-30 ml's daily 07/23/20   Gatha Mayer, MD  meclizine (ANTIVERT) 25 MG tablet Take 1 tablet (25 mg total) by mouth 2 (two) times daily as needed for up to 20 doses for dizziness. 05/22/20   Curatolo, Adam, DO  metoprolol tartrate (  LOPRESSOR) 50 MG tablet Take 1 tablet (50 mg total) by mouth 2 (two) times daily. 12/10/20   Biagio Borg, MD  Multiple Vitamin (MULTIVITAMIN WITH MINERALS) TABS tablet Take 1 tablet by mouth daily.    [provider]  MYRBETRIQ 25 MG TB24 tablet Take 25 mg by mouth daily. 03/03/20   [provider]  Potassium 99 MG TABS Take 99 mg by mouth daily.     [provider]  traMADol (ULTRAM) 50 MG tablet TAKE 1 TABLET BY MOUTH EVERY 6 HOURS AS NEEDED 01/27/21   Biagio Borg, MD    Allergies    Patient has no known allergies.  Review of Systems   Review of Systems  Constitutional: Negative.   HENT: Negative.    Eyes: Negative.   Respiratory: Negative.    Cardiovascular: Negative.   Gastrointestinal: Negative.   Genitourinary: Negative.   Musculoskeletal:  Positive for arthralgias.  Neurological: Negative.    Physical Exam Updated Vital Signs BP (!) 149/90 (BP Location: Right Arm)   Pulse 74   Temp 98.1 F (36.7 C) (Oral)   Resp 16   SpO2 96%   Physical Exam Vitals and nursing [name] reviewed.  HENT:     Head: Normocephalic and atraumatic.  Eyes:     General: No scleral icterus.       Right eye: No discharge.        Left eye: No discharge.      Conjunctiva/sclera: Conjunctivae normal.  Pulmonary:     Effort: Pulmonary effort is normal.  Musculoskeletal:     Right shoulder: Normal.     Left shoulder: Normal.     Right upper arm: Normal.     Left upper arm: Normal.     Right elbow: Swelling present. No deformity. Normal range of motion. No tenderness.     Left elbow: Normal.     Right forearm: Normal.     Left forearm: Normal.     Right wrist: Normal.     Left wrist: Snuff box tenderness present. No tenderness or bony tenderness. Normal range of motion. Normal pulse.     Right hand: Normal.     Left hand: Swelling, tenderness and bony tenderness present. Decreased range of motion. Normal pulse.       Arms:       Hands:     Comments: Normal cap refill in all 5 digits of the left hand.  2+ radial pulse bilaterally  Skin:    General: Skin is warm and dry.     Capillary Refill: Capillary refill takes less than 2 seconds.  Neurological:     General: No focal deficit present.     Mental Status: He is alert.  Psychiatric:        Mood and Affect: Mood normal.    ED Results / Procedures / Treatments   Labs (all labs ordered are listed, but only abnormal results are displayed) Labs Reviewed - No data to display  EKG None  Radiology DG Wrist Complete Left  Result Date: 02/21/2021 CLINICAL DATA:  Acute LEFT wrist pain following fall and injury 2 days ago. Initial encounter. EXAM: LEFT WRIST - COMPLETE 3+ VIEW COMPARISON:  None. FINDINGS: There is no evidence of fracture or dislocation. A cyst within the scaphoid is noted. Soft tissues are unremarkable. IMPRESSION: No acute bony abnormality. Electronically Signed   By: Margarette Canada M.D.   On: 02/21/2021 13:25   DG Hand Complete Left  Result Date: 02/21/2021 CLINICAL  DATA:  Acute LEFT hand pain following fall 2 days ago. Initial encounter. EXAM: LEFT HAND - COMPLETE 3+ VIEW COMPARISON:  None. FINDINGS: A small bony density along the MEDIAL aspect of the thumb tuft is noted  and a few tiny bony densities just LATERAL to the thumb interphalangeal joint are noted, age indeterminate. No other acute fracture, subluxation or dislocation identified. No focal bony lesions are present. IMPRESSION: Small bony density along the MEDIAL aspect of the thumb tuft and tiny bony densities just LATERAL to the thumb interphalangeal joint, which may represent fractures of uncertain chronicity. Correlate with pain. Electronically Signed   By: Margarette Canada M.D.   On: 02/21/2021 13:24    Procedures Procedures   Medications Ordered in ED Medications - No data to display  ED Course  I have reviewed the triage vital signs and the nursing notes.  Pertinent labs & imaging results that were available during my care of the patient were reviewed by me and considered in my medical decision making (see chart for details).    MDM Rules/Calculators/A&P                         70 year old male presents with concern for pain to the left hand after [place].  Differential diagnose includes is limited to acute fracture dislocation, ligamentous injury, contusion.  Hypertensive on intake, vitals otherwise normal.  Cardiopulmonary exam is normal.  Physical exam as above with swelling and tenderness palpation of the IP joint of the left thumb as well as the MCP joint of the left hand.  Tenderness palpation over the volar aspect of the tip of the left thumb.  Normal capillary refill and radial pulses bilaterally.  Decreased range of motion secondary swelling of the right thumb, range of motion of other digits in the hand.  Plain film left hand revealed Findings concerning for tuft fracture, as well as tiny bone densities lateral to the IP joint concerning for fracture.  Given patient is tender to palpation in both of these areas on exam, concern for fracture.  Given snuffbox tenderness will place in thumb spica splint and have him follow-up with hand.  No further work-up warranted in ED this time. [place] voiced  understanding of his medical evaluation and treatment plan.  She was questions answered to his expressed satisfaction.  Return precautions are given.  Patient is well-appearing, stable, appropriate for discharge this time.  This chart was dictated using voice recognition software, Dragon. Despite the best efforts of this provider to proofread and correct errors, errors may still occur which can change documentation meaning.   Final Clinical Impression(s) / ED Diagnoses Final diagnoses:  None    Rx / DC Orders ED Discharge Orders     None        Aura Dials 02/21/21 1420    Lacretia Leigh, MD 02/21/21 1442

## 2021-02-21 NOTE — Progress Notes (Signed)
Orthopedic Tech Progress Note Patient Details:  [name] Dec 16, 1950 [id number]  Ortho Devices Type of Ortho Device: Thumb velcro splint Ortho Device/Splint Location: left Ortho Device/Splint Interventions: Application   Post Interventions Patient Tolerated: Well Instructions Provided: Care of device  [name] 02/21/2021, 2:26 PM

## 2021-02-21 NOTE — Discharge Instructions (Addendum)
You were seen in the ER today for your thumb pain after your fall.  You do have a few small fractures there and you have some.  You been placed in a splint to protect your wrist and the base of your thumb.  Please follow-up with Dr. Lenon Curt, the hand surgeon listed below for further evaluation.  May continue use Tylenol and ice the area.  Return to the ER with any new numbness, ting, weakness in her hand, or any other new severe symptoms.

## 2021-02-21 NOTE — ED Triage Notes (Signed)
Pt states he tripped Thursday and fell on his left hand. Pt c/o pain to top of left hand, thumb, and wrist.

## 2021-02-25 DIAGNOSIS — M79642 Pain in left hand: Secondary | ICD-10-CM | POA: Diagnosis not present

## 2021-03-02 ENCOUNTER — Encounter: Payer: Self-pay | Admitting: *Deleted

## 2021-03-02 ENCOUNTER — Telehealth: Payer: Medicare Other

## 2021-03-02 ENCOUNTER — Telehealth: Payer: Self-pay | Admitting: *Deleted

## 2021-03-02 NOTE — Telephone Encounter (Signed)
  Chronic Care Management   Follow Up Note   03/02/2021 Name: [name] MRN: [id number] DOB: 1950-11-07  Referred by: Biagio Borg, MD Reason for referral : Chronic Care Management (CCM RN CM Follow Up Telephone Outreach- unsuccessful attempt)  An unsuccessful telephone outreach was attempted today. The patient was referred to the case management team for assistance with care management and care coordination.   Attempted calls x 2 to patient: noted new phone number listed in EHR; attempted this number as well as previous number provided by patient [phone]: received automated outgoing voice messages on both numbers, stating that patient has voice mail box which has not been set up  Follow Up Plan:  Unable to leave patient HIPPA compliant phone message requesting return call Will place request with scheduling care guide to contact patient to re-schedule today's missed CCM RN follow up telephone appointment if I do not hear from patient by end of day today  [name], RN, BSN, [place] [phone]: direct office [phone]: mobile

## 2021-03-12 ENCOUNTER — Ambulatory Visit (INDEPENDENT_AMBULATORY_CARE_PROVIDER_SITE_OTHER): Payer: Medicare Other | Admitting: *Deleted

## 2021-03-12 DIAGNOSIS — N183 Chronic kidney disease, stage 3 unspecified: Secondary | ICD-10-CM

## 2021-03-12 DIAGNOSIS — I10 Essential (primary) hypertension: Secondary | ICD-10-CM

## 2021-03-12 NOTE — Patient Instructions (Signed)
Visit Information  [name], it was nice talking with you today.    I look forward to talking to you again for an update on Monday, June 15, 2021 at 10:00 am- please be listening out for my call that day.  I will call as close to 10:00 am as possible.   If you need to cancel or re-schedule our telephone visit, please call [phone] and one of our care guides will be happy to assist you.   I look forward to hearing about your progress.   Please don't hesitate to contact me if I can be of assistance to you before our next scheduled telephone appointment.   Donald Rack, RN, BSN, [facility] RN Care Coordination- [place] [phone]: direct office [phone]: mobile   PATIENT GOALS:  Goals Addressed             This Visit's Progress    Patient self-care activities   On track    Timeframe:  Long-Range Goal Priority:  Medium Start Date:          03/12/21                   Expected End Date:  03/12/22                     Patient will self administer medications as prescribed Patient will attend all scheduled provider appointments  Patient will call pharmacy for medication refills  Patient will call provider office for new concerns or questions  Patient will continue to stay active and walk for exercise when possible around safe weather conditions Patient will continue to follow heart healthy, low salt diet Patient will continue to make every effort to prevent falls and will use his cane                   Health Maintenance After Age 9 After age 9, you are at a higher risk for certain long-term diseases and infections as well as injuries from falls. Falls are a major cause of broken bones and head injuries in people who are older than age 63. Getting regular preventive care can help to keep you healthy and well. Preventive care includes getting regular testing and making lifestyle changes as recommended by your health care provider. Talk  with your health care provider about: Which screenings and tests you should have. A screening is a test that checks for a disease when you have no symptoms. A diet and exercise plan that is right for you. What should I know about screenings and tests to prevent falls? Screening and testing are the best ways to find a health problem early. Early diagnosis and treatment give you the best chance of managing medical conditions that are common after age 82. Certain conditions and lifestyle choices may make you more likely to have a fall. Your health care provider may recommend: Regular vision checks. Poor vision and conditions such as cataracts can make you more likely to have a fall. If you wear glasses, make sure to get your prescription updated if your vision changes. Medicine review. Work with your health care provider to regularly review all of the medicines you are taking, including over-the-counter medicines. Ask your health care provider about any side effects that may make you more likely to have a fall. Tell your health care provider if any medicines that you take make you feel dizzy or sleepy. Osteoporosis screening. Osteoporosis is a condition  that causes the bones to get weaker. This can make the bones weak and cause them to break more easily. Blood pressure screening. Blood pressure changes and medicines to control blood pressure can make you feel dizzy. Strength and balance checks. Your health care provider may recommend certain tests to check your strength and balance while standing, walking, or changing positions. Foot health exam. Foot pain and numbness, as well as not wearing proper footwear, can make you more likely to have a fall. Depression screening. You may be more likely to have a fall if you have a fear of falling, feel emotionally low, or feel unable to do activities that you used to do. Alcohol use screening. Using too much alcohol can affect your balance and may make you more  likely to have a fall. What actions can I take to lower my risk of falls? General instructions Talk with your health care provider about your risks for falling. Tell your health care provider if: You fall. Be sure to tell your health care provider about all falls, even ones that seem minor. You feel dizzy, sleepy, or off-balance. Take over-the-counter and prescription medicines only as told by your health care provider. These include any supplements. Eat a healthy diet and maintain a healthy weight. A healthy diet includes low-fat dairy products, low-fat (lean) meats, and fiber from whole grains, beans, and lots of fruits and vegetables. Home safety Remove any tripping hazards, such as rugs, cords, and clutter. Install safety equipment such as grab bars in bathrooms and safety rails on stairs. Keep rooms and walkways well-lit. Activity  Follow a regular exercise program to stay fit. This will help you maintain your balance. Ask your health care provider what types of exercise are appropriate for you. If you need a cane or walker, use it as recommended by your health care provider. Wear supportive shoes that have nonskid soles. Lifestyle Do not drink alcohol if your health care provider tells you not to drink. If you drink alcohol, limit how much you have: 0-1 drink a day for women. 0-2 drinks a day for men. Be aware of how much alcohol is in your drink. In the [place] one drink equals one typical bottle of beer (12 oz), one-half glass of wine (5 oz), or one shot of hard liquor (1 oz). Do not use any products that contain nicotine or tobacco, such as cigarettes and e-cigarettes. If you need help quitting, ask your health care provider. Summary Having a healthy lifestyle and getting preventive care can help to protect your health and wellness after age 60. Screening and testing are the best way to find a health problem early and help you avoid having a fall. Early diagnosis and treatment give  you the best chance for managing medical conditions that are more common for people who are older than age 73. Falls are a major cause of broken bones and head injuries in people who are older than age 84. Take precautions to prevent a fall at home. Work with your health care provider to learn what changes you can make to improve your health and wellness and to prevent falls. This information is not intended to replace advice given to you by your health care provider. Make sure you discuss any questions you have with your health care provider. Document Revised: 08/01/2020 Document Reviewed: 05/09/2020 Elsevier Patient Education  [place] patient verbalized understanding of instructions, educational materials, and care plan provided today and agreed to receive a  mailed copy of patient instructions, educational materials, and care plan Telephone follow up appointment with care management team member scheduled for:  Monday, June 15, 2021 at 10:00 am The patient has been provided with contact information for the care management team and has been advised to call with any health related questions or concerns  Donald Rack, RN, BSN, [place] [phone]: direct office [phone]: mobile

## 2021-03-12 NOTE — Chronic Care Management (AMB) (Signed)
Chronic Care Management   CCM RN Visit Note  03/12/2021 Name: [name] MRN: [id number] DOB: [birth year]  Subjective: [name] is a 70 y.o. year old male who is a primary care patient of Biagio Borg, MD. The care management team was consulted for assistance with disease management and care coordination needs.    Engaged with patient by telephone for follow up visit in response to provider referral for case management and/or care coordination services.   Consent to Services:  The patient was given information about Chronic Care Management services, agreed to services, and gave verbal consent prior to initiation of services.  Please see initial visit note for detailed documentation.  Patient agreed to services and verbal consent obtained.   Assessment: Review of patient past medical history, allergies, medications, health status, including review of consultants reports, laboratory and other test data, was performed as part of comprehensive evaluation and provision of chronic care management services.   SDOH (Social Determinants of Health) assessments and interventions performed:  SDOH Interventions    Flowsheet Row Most Recent Value  SDOH Interventions   Food Insecurity Interventions Intervention Not Indicated  [patient previously provided resources for food insecurity- denies need for additional resources today]  Housing Interventions Intervention Not Indicated  Transportation Interventions Intervention Not Indicated  [Patient reports he uses a friend's car to attend appointments- denies transportation needs today]      CCM Care Plan No Known Allergies  Outpatient Encounter Medications as of 03/12/2021  Medication Sig Note   acetaminophen (TYLENOL) 500 MG tablet Take 1,000 mg by mouth every 6 (six) hours as needed for moderate pain.     allopurinol (ZYLOPRIM) 100 MG tablet Take 2 tablets (200 mg total) by mouth daily.    amLODipine (NORVASC) 10 MG tablet Take 1 tablet (10 mg  total) by mouth daily.    aspirin EC 81 MG tablet Take 81 mg by mouth daily.    atorvastatin (LIPITOR) 40 MG tablet Take 1 tablet (40 mg total) by mouth daily.    cholecalciferol (VITAMIN D3) 25 MCG (1000 UNIT) tablet Take 1,000 Units by mouth daily.    colchicine 0.6 MG tablet Take 1 tablet (0.6 mg total) by mouth daily.    hydroxypropyl methylcellulose / hypromellose (ISOPTO TEARS / GONIOVISC) 2.5 % ophthalmic solution Place 1-2 drops into both eyes 3 (three) times daily as needed (dry/irritated eyes.). 08/07/2020: 08/07/20: Reports uses prn   lactulose (CHRONULAC) 10 GM/15ML solution Take 30 mLs (20 g total) by mouth daily. (Patient taking differently: Take 20 g by mouth daily. Take 15-30 ml's daily)    meclizine (ANTIVERT) 25 MG tablet Take 1 tablet (25 mg total) by mouth 2 (two) times daily as needed for up to 20 doses for dizziness. 08/07/2020: 08/07/20: Patient reports taking prn; has not needed recently   metoprolol tartrate (LOPRESSOR) 50 MG tablet Take 1 tablet (50 mg total) by mouth 2 (two) times daily.    Multiple Vitamin (MULTIVITAMIN WITH MINERALS) TABS tablet Take 1 tablet by mouth daily.    MYRBETRIQ 25 MG TB24 tablet Take 25 mg by mouth daily. 08/07/2020: 08/07/20: Patient reports he is not currently taking   oxyCODONE (ROXICODONE) 5 MG immediate release tablet Take 1 tablet (5 mg total) by mouth every 4 (four) hours as needed for severe pain.    Potassium 99 MG TABS Take 99 mg by mouth daily.     No facility-administered encounter medications on file as of 03/12/2021.   Patient Active Problem List  Diagnosis Date Noted   Aortic atherosclerosis ([place]) 12/10/2020   Chronic pain of right knee 12/10/2020   Bilateral elbow joint pain 07/09/2020   Acute renal failure superimposed on chronic kidney disease ([place]) 11/12/2019   Dry eyes 11/12/2019   S/P hernia repair 10/17/2019   Abdominal pain 07/06/2019   BPH with obstruction/lower urinary tract symptoms 03/09/2019   Clot retention of urine  02/11/2019   Vitamin D deficiency 09/13/2017   Urinary tract infection without hematuria 07/23/2017   Low back pain 07/03/2017   Dizziness 06/30/2017   Acute gouty arthritis 12/01/2016   Degenerative arthritis of knee, bilateral 11/18/2016   Urinary retention due to benign prostatic hyperplasia 10/24/2016   Mass of right side of neck 10/20/2016   Gout 10/20/2016   Right knee pain 10/12/2016   Hypokalemia 05/28/2016   Encounter for well adult exam with abnormal findings 11/27/2015   Obstructive uropathy 11/27/2015   Elevated PSA 11/27/2015   Acute pyelonephritis 05/27/2015   Generalized bloating 05/27/2015   Chronic constipation 05/27/2015   Chest pain 05/27/2015   AKI (acute kidney injury) ([place]) 05/27/2015   Acute sinus infection 11/22/2014   Cough 09/25/2014   Hemorrhoids, internal, with bleeding, prolapse 09/19/2014   Chronic UTI 05/29/2014   Hyperlipidemia 05/29/2014   Lumbar radiculopathy 05/23/2014   Lumbar stenosis 11/20/2013   Abnormal glucose 11/06/2013   Unspecified hereditary and idiopathic peripheral neuropathy 01/22/2013   Cardiomyopathy due to hypertension ([place]) 12/28/2011   CKD (chronic kidney disease) stage 3, GFR 30-59 ml/min ([facility]) 11/24/2011   Hematuria 11/24/2011   Hydronephrosis 11/24/2011   Anemia 11/24/2011   BRADYCARDIA 02/05/2010   TINEA PEDIS 01/29/2010   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   PERIPHERAL EDEMA 01/29/2010   ABNORMAL ELECTROCARDIOGRAM 01/29/2010   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   Osteoarthrosis, generalized, 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   ONYCHOMYCOSIS, TOENAILS 10/02/2007   BPH (benign prostatic hyperplasia) 10/02/2007   Essential hypertension 03/06/2007   Conditions to be addressed/monitored:  HTN and CKD Stage III  Care Plan : Hypertension (Adult)  Updates made by Knox Royalty, RN since 03/12/2021 12:00 AM     Problem: Hypertension (Hypertension) Deleted 03/12/2021  Priority: Medium     Long-Range Goal: Hypertension  Monitored Completed 03/12/2021  Start Date: 08/07/2020  Expected End Date: 12/15/2021  Recent Progress: On track  Priority: Medium  Note:   Objective:  Last practice recorded BP readings:  BP Readings from Last 3 Encounters:  07/25/20 122/82  07/23/20 (!) 144/76  07/09/20 122/72  Most recent eGFR/CrCl: No results found for: EGFR  No components found for: CRCL Current Barriers:  Knowledge Deficits related to basic understanding of hypertension pathophysiology in setting of CKD- could benefit from ongoing reinforcement Financial Constraints- care guide referral placed for resources around food acquisition and housing/ rent costs: confirmed resource information provided by care guide (referral placed 08/07/20) Unable to independently verbalize blood pressure readings at home; does not regularly monitor blood pressures Case Manager Clinical Goal(s):  Goal re-established, extended/ modified 12/15/20 03/12/21- CCM RN CM care plan converted to new care plan format: goals re-established; please see newly developed care plan Over the next 12 months, patient will demonstrate ongoing good adherence to prescribed treatment plan for hypertension and CKD as evidenced by patient reporting during CCM RN CM outreach of: Taking all medications as prescribed,  Monitoring and recording blood pressure weekly,  Adhering to low sodium/DASH/ heart healthy/ renal diet Attending all provider appointments Contacting care providers promptly for any new concerns, issues, or problems that arise  Care Plan : RN Care Manager Plan of Care  Updates made by Knox Royalty, RN since 03/12/2021 12:00 AM     Problem: Chronic Disease Management Needs   Priority: Medium     Long-Range Goal: Development of plan of care for long term chronic disease management   Start Date: 03/12/2021  Expected End Date: 03/12/2022  Priority: Medium  Note:   Current Barriers:  Chronic Disease Management support and education needs related  to HTN and CKD Stage III  RNCM Clinical Goal(s):  Patient will demonstrate ongoing health management independence CKD; HTN  through collaboration with RN Care manager, provider, and care team.   Interventions: 1:1 collaboration with primary care provider regarding development and update of comprehensive plan of care as evidenced by provider attestation and co-signature Inter-disciplinary care team collaboration (see longitudinal plan of care) Evaluation of current treatment plan related to  self management and patient's adherence to plan as established by provider  Chronic Kidney Disease (Status: New goal.)  Last practice recorded BP readings:  BP Readings from Last 3 Encounters:  02/21/21 (!) 144/95  12/10/20 138/76  12/04/20 122/80  Most recent eGFR/CrCl: No results found for: EGFR  No components found for: CRCL  Assessed the patient     understanding of chronic kidney disease    Evaluation of current treatment plan related to chronic kidney disease self management and patient's adherence to plan as established by provider      Reviewed prescribed diet renal, low salt, heart healthy Counseled on the importance of exercise goals with target of 150 minutes per week     Discussed complications of poorly controlled blood pressure such as heart disease, stroke, circulatory complications, vision complications, kidney impairment, sexual dysfunction    Discussed plans with patient for ongoing care management follow up and provided patient with direct contact information for care management team    Discussed the impact of chronic kidney disease on daily life and mental health and acknowledged and normalized feelings of disempowerment, fear, and frustration    Assessed social determinant of health barriers    Confirmed patient has been attending renal provider office visits as scheduled: he reports no recent visits; states cannot remember when next visit is- but states he will go as  scheduled Confirmed no recent medication changes or new concerns  Hypertension: (Status: New goal.) Last practice recorded BP readings:  BP Readings from Last 3 Encounters:  02/21/21 (!) 144/95  12/10/20 138/76  12/04/20 122/80  Most recent eGFR/CrCl: No results found for: EGFR  No components found for: CRCL  Evaluation of current treatment plan related to hypertension self management and patient's adherence to plan as established by provider;   Discussed plans with patient for ongoing care management follow up and provided patient with direct contact information for care management team; Discussed complications of poorly controlled blood pressure such as heart disease, stroke, circulatory complications, vision complications, kidney impairment, sexual dysfunction;  Reviewed recent ED visit with patient: mechanical fall due to discrepancy in walkway; continues to use cane, reports hand/ thumb injury "improving;" confirms he had provider office visit with "hand specialist" and states no specific treatment recommended- continues to use hand splint; will follow up with hand specialist as needed Confirmed patient continues to walk "about every day" for exercise; states "staying active;" positive reinforcement provided; fall prevention education provided Discussed with patient ongoing knee pain/ possible need for knee replacement: continues to report trying to avoid need for knee replacement; currently having "a little fluid  build up" in knee- verbalizes plans to schedule with sports medicine provider whom he is already established with- this was encouraged Confirmed has not been monitoring blood pressures at home- states he monitors at provider office visits; occasionally when at outpatient pharmacies; states his blood pressures "have been okay" Confirmed patient continues following heart healthy low salt diet- this was encouraged  Patient Goals/Self-Care Activities: Patient will self administer  medications as prescribed Patient will attend all scheduled provider appointments  Patient will call pharmacy for medication refills  Patient will call provider office for new concerns or questions  Patient will continue to stay active and walk for exercise when possible around safe weather conditions Patient will continue to follow heart healthy, low salt diet Patient will continue to make every effort to prevent falls and will use his cane       Plan: Telephone follow up appointment with care management team member scheduled for:  Monday, June 15, 2021 at 10:00 am The patient has been provided with contact information for the care management team and has been advised to call with any health related questions or concerns  [name], RN, BSN, [place] [phone]: direct office [phone]: mobile

## 2021-03-17 NOTE — Progress Notes (Signed)
[facility] [place] Phone: [phone] Subjective:   [name], am serving as a scribe for Dr. Hulan Saas.  This visit occurred during the SARS-CoV-2 public health emergency.  Safety protocols were in place, including screening questions prior to the visit, additional usage of staff PPE, and extensive cleaning of exam room while observing appropriate contact time as indicated for disinfecting solutions.   I'm seeing this patient by the request  of:  Biagio Borg, MD  CC: Right knee pain follow-up  [id number]  12/04/2020 Injection given again today.  Tolerated the procedure well, discussed icing regimen and home exercises.  Discussed with patient that should consider the possibility of replacement when patient returns from visiting his wife.  Patient understands this and we will refer accordingly.  Updated 03/18/2021 [name] is a 70 y.o. male coming in with complaint of right knee pain. Increase in pain since last visit. Swelling over lateral aspect of joint. Using Tylenol and Tramadol.   Patient fell 4 weeks ago and injured L hand. Was told that he did not break anything after going to ED. Patient did use hot water to treat the injury and has some sloughing of skin due to a burn.        Past Medical History:  Diagnosis Date   Anemia    normal Fe, nl B12, nl retic, nl EPO July '13   Aortic regurgitation    Moderate AI 04/17/19 echo   Blood transfusion without reported diagnosis    BPH (benign prostatic hyperplasia)    Chronic back pain    Chronic kidney disease    CKD III, obstructive nephropathy   Diverticulosis    Dysuria    Elevated PSA, greater than or equal to 20 ng/ml June '13   PSA 107   Hemorrhoids, internal, with bleeding, prolapse 09/19/2014   Hyperlipidemia 05/29/2014   Hypertension    Obstructive uropathy 11/27/2015   Pre-diabetes    per patient "reduced sugar intake"   Renal  insufficiency    Tuberculosis    h/o PPD +   UTI (urinary tract infection)    Vitamin D deficiency 09/13/2017   Past Surgical History:  Procedure Laterality Date   CARPAL TUNNEL RELEASE Right    COLONOSCOPY     HEMORRHOID BANDING     INSERTION OF MESH N/A 10/17/2019   Procedure: Insertion Of Mesh;  Surgeon: Ralene Ok, MD;  Location: [facility];  Service: General;  Laterality: N/A;   SPLENECTOMY     XI ROBOTIC ASSISTED SIMPLE PROSTATECTOMY N/A 03/09/2019   Procedure: XI ROBOTIC ASSISTED SIMPLE PROSTATECTOMY;  Surgeon: Cleon Gustin, MD;  Location: [facility];  Service: Urology;  Laterality: N/A;   XI ROBOTIC ASSISTED VENTRAL HERNIA N/A 10/17/2019   Procedure: XI ROBOTIC ASSISTED INCISIONAL HERNIA REPAIR WITH MESH;  Surgeon: Ralene Ok, MD;  [facility];  Service: General;  Laterality: N/A;   Social History   Socioeconomic History   Marital status: Single    Spouse name: Not on file   Number of children: 2   Years of education: 20   Highest education level: Some college, [name] degree  Occupational History   Occupation: Lobbyist: [employer]    Comment: unemployed  Tobacco Use   Smoking status: Former    Types: Cigarettes    Quit date: 02/26/1980    Years since quitting: 41.0   Smokeless tobacco: Never  Vaping Use  Vaping Use: Never used  Substance and Sexual Activity   Alcohol use: [name]    Alcohol/week: 0.0 standard drinks   Drug use: [name]   Sexual activity: Not Currently  Other Topics Concern   Not on file  Social History Narrative   Native of [place], raised poor farming community. . To [place] '78 - finished College, all but Arts administrator. Married - wife in [place], blocked from [place] to [place]. 1 son '90; 1 dtr '93. 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 Resources depleted.   Right handed.   Caffeine Hot daily one daily.   Social Determinants of Health   Financial Resource Strain: Medium Risk   Difficulty of  Paying Living Expenses: Somewhat hard  Food Insecurity: Food Insecurity Present   Worried About Running Out of Food in the Last Year: Sometimes true   Ran Out of Food in the Last Year: Never true  Transportation Needs: [name] Transportation Needs   Lack of Transportation (Medical): [name]   Lack of Transportation (Non-Medical): [name]  Physical Activity: Inactive   Days of Exercise per Week: 0 days   Minutes of Exercise per Session: 0 min  Stress: [name] Stress Concern Present   Feeling of Stress : Not at all  Social Connections: Moderately Isolated   Frequency of Communication with Friends and Family: More than three times a week   Frequency of Social Gatherings with Friends and Family: Once a week   Attends Religious Services: Never   Marine scientist or Organizations: [name]   Attends Music therapist: Never   Marital Status: Married   [name] Known Allergies Family History  Problem Relation Age of Onset   Diabetes Mother    Stroke Brother    Hearing loss Brother    Colon cancer Neg Hx    Rectal cancer Neg Hx    Stomach cancer Neg Hx    Esophageal cancer Neg Hx      Current Outpatient Medications (Cardiovascular):    amLODipine (NORVASC) 10 MG tablet, Take 1 tablet (10 mg total) by mouth daily.   atorvastatin (LIPITOR) 40 MG tablet, Take 1 tablet (40 mg total) by mouth daily.   metoprolol tartrate (LOPRESSOR) 50 MG tablet, Take 1 tablet (50 mg total) by mouth 2 (two) times daily.   Current Outpatient Medications (Analgesics):    acetaminophen (TYLENOL) 500 MG tablet, Take 1,000 mg by mouth every 6 (six) hours as needed for moderate pain.    allopurinol (ZYLOPRIM) 100 MG tablet, Take 2 tablets (200 mg total) by mouth daily.   aspirin EC 81 MG tablet, Take 81 mg by mouth daily.   colchicine 0.6 MG tablet, Take 1 tablet (0.6 mg total) by mouth daily.   oxyCODONE (ROXICODONE) 5 MG immediate release tablet, Take 1 tablet (5 mg total) by mouth every 4 (four) hours as needed for  severe pain.   Current Outpatient Medications (Other):    cholecalciferol (VITAMIN D3) 25 MCG (1000 UNIT) tablet, Take 1,000 Units by mouth daily.   hydroxypropyl methylcellulose / hypromellose (ISOPTO TEARS / GONIOVISC) 2.5 % ophthalmic solution, Place 1-2 drops into both eyes 3 (three) times daily as needed (dry/irritated eyes.).   lactulose (CHRONULAC) 10 GM/15ML solution, Take 30 mLs (20 g total) by mouth daily. (Patient taking differently: Take 20 g by mouth daily. Take 15-30 ml's daily)   meclizine (ANTIVERT) 25 MG tablet, Take 1 tablet (25 mg total) by mouth 2 (two) times daily as needed for up to  20 doses for dizziness.   Multiple Vitamin (MULTIVITAMIN WITH MINERALS) TABS tablet, Take 1 tablet by mouth daily.   MYRBETRIQ 25 MG TB24 tablet, Take 25 mg by mouth daily.   Potassium 99 MG TABS, Take 99 mg by mouth daily.    Reviewed prior external information including notes and imaging from  primary care provider As well as notes that were available from care everywhere and other healthcare systems.  Past medical history, social, surgical and family history all reviewed in electronic medical record.  [name] pertanent information unless stated regarding to the chief complaint.   Review of Systems:  [name] headache, visual changes, nausea, vomiting, diarrhea, constipation, dizziness, abdominal pain, skin rash, fevers, chills, night sweats, weight loss, swollen lymph nodes, body aches, joint swelling, chest pain, shortness of breath, mood changes. POSITIVE muscle aches  Objective  Blood pressure 138/80, pulse (!) 50, height 6' (1.829 m), weight 270 lb (122.5 kg), SpO2 93 %.   General: [name] apparent distress alert and oriented x3 mood and affect normal, dressed appropriately.  HEENT: Pupils equal, extraocular movements intact  Respiratory: Patient's speak in full sentences and does not appear short of breath  Cardiovascular: 1+ lower extremity edema, mildly tender, [name] erythema  Gait severely  antalgic Right knee does have significant arthritic changes noted.  [name] effusion noted.  Limited range of motion and does have instability with valgus and varus force. Left thumb does show more than exfoliation.  [name] significant burns noted at the moment.  Patient though of the thumb does have some swelling noted of the IP joint.  Patient does also have laxity of the UCL compared to contralateral side.  Procedure: Real-time Ultrasound Guided Injection of right knee Device: GE Logiq Q7 Ultrasound guided injection is preferred based studies that show increased duration, increased effect, greater accuracy, decreased procedural pain, increased response rate, and decreased cost with ultrasound guided versus blind injection.  Verbal informed consent obtained.  Time-out conducted.  Noted [name] overlying erythema, induration, or other signs of local infection.  Skin prepped in a sterile fashion.  Local anesthesia: Topical Ethyl chloride.  With sterile technique and under real time ultrasound guidance: With a 22-gauge 2 inch needle patient was injected with 4 cc of 0.5% Marcaine and 35 cc of straw light-colored fluid then injected 1 cc of Kenalog 40 mg/dL. This was from a superior lateral approach.  Completed without difficulty  Pain immediately resolved suggesting accurate placement of the medication.  Advised to call if fevers/chills, erythema, induration, drainage, or persistent bleeding.  Impression: Technically successful ultrasound guided injection.   Impression and Recommendations:    The above documentation has been reviewed and is accurate and complete Lyndal Pulley, DO

## 2021-03-18 ENCOUNTER — Other Ambulatory Visit: Payer: Self-pay

## 2021-03-18 ENCOUNTER — Ambulatory Visit (INDEPENDENT_AMBULATORY_CARE_PROVIDER_SITE_OTHER): Payer: Medicare Other | Admitting: Family Medicine

## 2021-03-18 ENCOUNTER — Encounter: Payer: Self-pay | Admitting: Family Medicine

## 2021-03-18 ENCOUNTER — Ambulatory Visit: Payer: Self-pay

## 2021-03-18 VITALS — BP 138/80 | HR 50 | Ht 72.0 in | Wt 270.0 lb

## 2021-03-18 DIAGNOSIS — M170 Bilateral primary osteoarthritis of knee: Secondary | ICD-10-CM | POA: Diagnosis not present

## 2021-03-18 DIAGNOSIS — S63642A Sprain of metacarpophalangeal joint of left thumb, initial encounter: Secondary | ICD-10-CM

## 2021-03-18 DIAGNOSIS — M79642 Pain in left hand: Secondary | ICD-10-CM

## 2021-03-18 NOTE — Assessment & Plan Note (Signed)
Underlying the patient does have a partial rupture of the UCL on the left thumb.  Patient does have some ligamentous laxity.  Patient will be put in a thumb spica but we will hold on the true splinting.  Encourage patient for at least the next 3 weeks with regularly and did discuss may be some better options in 6 weeks.  We discussed following up in 3 to 6 weeks for further evaluation.

## 2021-03-18 NOTE — Assessment & Plan Note (Signed)
Chronic problem with exacerbation.  He does have effusion noted again today.  He does have the reactive synovitis.  Discussed with patient about icing regimen and home exercises.  Discussed which activities to do which wants to avoid.  Increase activity slowly.  Follow-up again in 6 to 8 weeks.

## 2021-03-18 NOTE — Patient Instructions (Addendum)
Thumb spica splint Wear brace day and night for 3 weeks and then just at night for 3 weeks See me again in 6 weeks

## 2021-04-06 DIAGNOSIS — I10 Essential (primary) hypertension: Secondary | ICD-10-CM | POA: Diagnosis not present

## 2021-04-06 DIAGNOSIS — N183 Chronic kidney disease, stage 3 unspecified: Secondary | ICD-10-CM

## 2021-04-15 ENCOUNTER — Other Ambulatory Visit: Payer: Self-pay | Admitting: Internal Medicine

## 2021-04-24 NOTE — Progress Notes (Deleted)
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  Patient does have some ligamentous laxity.  Patient will be put in a thumb spica but we will hold on the true splinting.  Encourage patient for at least the next 3 weeks with regularly and did discuss may be some better options in 6 weeks.  We discussed following up in 3 to 6 weeks for further evaluation. Chronic problem with exacerbation.  He does have effusion noted again today.  He does have the reactive synovitis.  Discussed with patient about icing regimen and home exercises.  Discussed which activities to do which wants to avoid.  Increase activity slowly.  Follow-up again in 6 to 8 weeks.  Updated 04/27/2021 [name] is a 70 y.o. male coming in with complaint of right knee pain  Onset-  Location Duration-  Character- Aggravating factors- Reliving factors-  Therapies tried-  Severity-     Past Medical History:  Diagnosis Date   Anemia    normal Fe, nl B12, nl retic, nl EPO July '13   Aortic regurgitation    Moderate AI 04/17/19 echo   Blood transfusion without reported diagnosis    BPH (benign prostatic hyperplasia)    Chronic back pain    Chronic kidney disease    CKD III, obstructive nephropathy   Diverticulosis    Dysuria    Elevated PSA, greater than or equal to 20 ng/ml June '13   PSA 107   Hemorrhoids, internal, with bleeding, prolapse 09/19/2014   Hyperlipidemia 05/29/2014   Hypertension    Obstructive uropathy 11/27/2015   Pre-diabetes    per patient "reduced sugar intake"   Renal insufficiency    Tuberculosis    h/o PPD +   UTI (urinary tract infection)    Vitamin D deficiency 09/13/2017   Past Surgical History:  Procedure Laterality Date   CARPAL TUNNEL  RELEASE Right    COLONOSCOPY     HEMORRHOID BANDING     INSERTION OF MESH N/A 10/17/2019   Procedure: Insertion Of Mesh;  Surgeon: Ralene Ok, MD;  Location: [place];  Service: General;  Laterality: N/A;   SPLENECTOMY     XI ROBOTIC ASSISTED SIMPLE PROSTATECTOMY N/A 03/09/2019   Procedure: XI ROBOTIC ASSISTED SIMPLE PROSTATECTOMY;  Surgeon: Cleon Gustin, MD;  Location: [facility];  Service: Urology;  Laterality: N/A;   XI ROBOTIC ASSISTED VENTRAL HERNIA N/A 10/17/2019   Procedure: XI ROBOTIC ASSISTED INCISIONAL HERNIA REPAIR WITH MESH;  Surgeon: Ralene Ok, MD;  Location: [place];  Service: General;  Laterality: N/A;   Social History   Socioeconomic History   Marital status: Single    Spouse name: Not on file   Number of children: 2   Years of education: 20   Highest education level: Some college, no degree  Occupational History   Occupation: Lobbyist: [employer]    Comment: unemployed  Tobacco Use   Smoking status: Former    Types: Cigarettes    Quit date: 02/26/1980    Years since quitting: 41.1   Smokeless tobacco: Never  Vaping Use   Vaping Use: Never used  Substance and Sexual Activity   Alcohol use: No    Alcohol/week: 0.0 standard  drinks   Drug use: No   Sexual activity: Not Currently  Other Topics Concern   Not on file  Social History Narrative   Native of [place], raised poor farming community. . To [place] '78 - finished College, all but Arts administrator. Married - wife in [place], blocked from [place] to [place]. 1 son '90; 1 dtr '93. 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 Resources depleted.   Right handed.   Caffeine Hot daily one daily.   Social Determinants of Health   Financial Resource Strain: Medium Risk   Difficulty of Paying Living Expenses: Somewhat hard  Food Insecurity: Food Insecurity Present   Worried About Running Out of Food in the Last Year: Sometimes true   Ran Out of Food in the Last  Year: Never true  Transportation Needs: No Transportation Needs   Lack of Transportation (Medical): No   Lack of Transportation (Non-Medical): No  Physical Activity: Inactive   Days of Exercise per Week: 0 days   Minutes of Exercise per Session: 0 min  Stress: No Stress Concern Present   Feeling of Stress : Not at all  Social Connections: Moderately Isolated   Frequency of Communication with Friends and Family: More than three times a week   Frequency of Social Gatherings with Friends and Family: Once a week   Attends Religious Services: Never   Marine scientist or Organizations: No   Attends Music therapist: Never   Marital Status: Married   No Known Allergies Family History  Problem Relation Age of Onset   Diabetes Mother    Stroke Brother    Hearing loss Brother    Colon cancer Neg Hx    Rectal cancer Neg Hx    Stomach cancer Neg Hx    Esophageal cancer Neg Hx      Current Outpatient Medications (Cardiovascular):    amLODipine (NORVASC) 10 MG tablet, Take 1 tablet (10 mg total) by mouth daily.   atorvastatin (LIPITOR) 40 MG tablet, Take 1 tablet (40 mg total) by mouth daily.   metoprolol tartrate (LOPRESSOR) 50 MG tablet, Take 1 tablet (50 mg total) by mouth 2 (two) times daily.   Current Outpatient Medications (Analgesics):    acetaminophen (TYLENOL) 500 MG tablet, Take 1,000 mg by mouth every 6 (six) hours as needed for moderate pain.    allopurinol (ZYLOPRIM) 100 MG tablet, Take 2 tablets (200 mg total) by mouth daily.   aspirin EC 81 MG tablet, Take 81 mg by mouth daily.   colchicine 0.6 MG tablet, Take 1 tablet (0.6 mg total) by mouth daily.   oxyCODONE (ROXICODONE) 5 MG immediate release tablet, Take 1 tablet (5 mg total) by mouth every 4 (four) hours as needed for severe pain.   Current Outpatient Medications (Other):    cholecalciferol (VITAMIN D3) 25 MCG (1000 UNIT) tablet, Take 1,000 Units by mouth daily.   hydroxypropyl methylcellulose /  hypromellose (ISOPTO TEARS / GONIOVISC) 2.5 % ophthalmic solution, Place 1-2 drops into both eyes 3 (three) times daily as needed (dry/irritated eyes.).   lactulose (CONSTULOSE) 10 GM/15ML solution, Take 30 mLs (20 g total) by mouth daily. Take 15-30 ml's daily   meclizine (ANTIVERT) 25 MG tablet, Take 1 tablet (25 mg total) by mouth 2 (two) times daily as needed for up to 20 doses for dizziness.   Multiple Vitamin (MULTIVITAMIN WITH MINERALS) TABS tablet, Take 1 tablet by mouth daily.   MYRBETRIQ 25 MG TB24 tablet, Take 25 mg  by mouth daily.   Potassium 99 MG TABS, Take 99 mg by mouth daily.    Reviewed prior external information including notes and imaging from  primary care provider As well as notes that were available from care everywhere and other healthcare systems.  Past medical history, social, surgical and family history all reviewed in electronic medical record.  No pertanent information unless stated regarding to the chief complaint.   Review of Systems:  No headache, visual changes, nausea, vomiting, diarrhea, constipation, dizziness, abdominal pain, skin rash, fevers, chills, night sweats, weight loss, swollen lymph nodes, body aches, joint swelling, chest pain, shortness of breath, mood changes. POSITIVE muscle aches  Objective  There were no vitals taken for this visit.   General: No apparent distress alert and oriented x3 mood and affect normal, dressed appropriately.  HEENT: Pupils equal, extraocular movements intact  Respiratory: Patient's speak in full sentences and does not appear short of breath  Cardiovascular: No lower extremity edema, non tender, no erythema  Gait normal with good balance and coordination.  MSK:  Non tender with full range of motion and good stability and symmetric strength and tone of shoulders, elbows, wrist, hip, knee and ankles bilaterally.     Impression and Recommendations:     The above documentation has been reviewed and is accurate and  complete [name]

## 2021-04-27 ENCOUNTER — Ambulatory Visit: Payer: Medicare Other | Admitting: Family Medicine

## 2021-04-28 ENCOUNTER — Other Ambulatory Visit: Payer: Self-pay

## 2021-04-28 ENCOUNTER — Ambulatory Visit: Payer: Self-pay

## 2021-04-28 ENCOUNTER — Encounter: Payer: Self-pay | Admitting: Family Medicine

## 2021-04-28 ENCOUNTER — Ambulatory Visit (INDEPENDENT_AMBULATORY_CARE_PROVIDER_SITE_OTHER): Payer: Medicare Other | Admitting: Family Medicine

## 2021-04-28 VITALS — BP 142/76 | HR 61 | Wt 258.0 lb

## 2021-04-28 DIAGNOSIS — G8929 Other chronic pain: Secondary | ICD-10-CM

## 2021-04-28 DIAGNOSIS — M25561 Pain in right knee: Secondary | ICD-10-CM

## 2021-04-28 DIAGNOSIS — M170 Bilateral primary osteoarthritis of knee: Secondary | ICD-10-CM | POA: Diagnosis not present

## 2021-04-28 MED ORDER — DOXYCYCLINE HYCLATE 100 MG PO TABS
100.0000 mg | ORAL_TABLET | Freq: Two times a day (BID) | ORAL | 0 refills | Status: DC
Start: 2021-04-28 — End: 2021-11-30

## 2021-04-28 NOTE — Patient Instructions (Addendum)
Drained knee again today Antibiotic if you need it See me in 6-8 weeks

## 2021-04-28 NOTE — Progress Notes (Signed)
[name] [address] [place] Phone: [phone] Subjective:   [name], am serving as a scribe for Dr. Hulan Saas. This visit occurred during the SARS-CoV-2 public health emergency.  Safety protocols were in place, including screening questions prior to the visit, additional usage of staff PPE, and extensive cleaning of exam room while observing appropriate contact time as indicated for disinfecting solutions.   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  Patient does have some ligamentous laxity.  Patient will be put in a thumb spica but we will hold on the true splinting.  Encourage patient for at least the next 3 weeks with regularly and did discuss may be some better options in 6 weeks.  We discussed following up in 3 to 6 weeks for further evaluation.  Chronic problem with exacerbation.  He does have effusion noted again today.  He does have the reactive synovitis.  Discussed with patient about icing regimen and home exercises.  Discussed which activities to do which wants to avoid.  Increase activity slowly.  Follow-up again in 6 to 8 weeks.  Updated 04/28/2021 [name] is a 70 y.o. male coming in with complaint of bilateral knee pain. Thumb is getting better, knees are beginning to hurt again. Would like injection. No new complaints.  States that the pain in the knee is the worst.  Still having difficulty with even walking.  He has noticed more swelling recently.       Past Medical History:  Diagnosis Date   Anemia    normal Fe, nl B12, nl retic, nl EPO July '13   Aortic regurgitation    Moderate AI 04/17/19 echo   Blood transfusion without reported diagnosis    BPH (benign prostatic hyperplasia)    Chronic back pain    Chronic kidney disease    CKD  III, obstructive nephropathy   Diverticulosis    Dysuria    Elevated PSA, greater than or equal to 20 ng/ml June '13   PSA 107   Hemorrhoids, internal, with bleeding, prolapse 09/19/2014   Hyperlipidemia 05/29/2014   Hypertension    Obstructive uropathy 11/27/2015   Pre-diabetes    per patient "reduced sugar intake"   Renal insufficiency    Tuberculosis    h/o PPD +   UTI (urinary tract infection)    Vitamin D deficiency 09/13/2017   Past Surgical History:  Procedure Laterality Date   CARPAL TUNNEL RELEASE Right    COLONOSCOPY     HEMORRHOID BANDING     INSERTION OF MESH N/A 10/17/2019   Procedure: Insertion Of Mesh;  Surgeon: Ralene Ok, MD;  Location: [place];  Service: General;  Laterality: N/A;   SPLENECTOMY     XI ROBOTIC ASSISTED SIMPLE PROSTATECTOMY N/A 03/09/2019   Procedure: XI ROBOTIC ASSISTED SIMPLE PROSTATECTOMY;  Surgeon: Cleon Gustin, MD;  Location: [facility];  Service: Urology;  Laterality: N/A;   XI ROBOTIC ASSISTED VENTRAL HERNIA N/A 10/17/2019   Procedure: XI ROBOTIC ASSISTED INCISIONAL HERNIA REPAIR WITH MESH;  Surgeon: Ralene Ok, MD;  Location: [place];  Service: General;  Laterality: N/A;   Social History   Socioeconomic History   Marital status: Single    Spouse name: Not on file   Number of children: 2   Years  of education: 20   Highest education level: Some college, no degree  Occupational History   Occupation: Lobbyist: [employer]    Comment: unemployed  Tobacco Use   Smoking status: Former    Types: Cigarettes    Quit date: 02/26/1980    Years since quitting: 41.1   Smokeless tobacco: Never  Vaping Use   Vaping Use: Never used  Substance and Sexual Activity   Alcohol use: No    Alcohol/week: 0.0 standard drinks   Drug use: No   Sexual activity: Not Currently  Other Topics Concern   Not on file  Social History Narrative   Native of [place], raised poor farming community. . To [place] '78 - finished College, all but  Arts administrator. Married - wife in [place], blocked from [place] to [place]. 1 son '90; 1 dtr '93. 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 Resources depleted.   Right handed.   Caffeine Hot daily one daily.   Social Determinants of Health   Financial Resource Strain: Medium Risk   Difficulty of Paying Living Expenses: Somewhat hard  Food Insecurity: Food Insecurity Present   Worried About Running Out of Food in the Last Year: Sometimes true   Ran Out of Food in the Last Year: Never true  Transportation Needs: No Transportation Needs   Lack of Transportation (Medical): No   Lack of Transportation (Non-Medical): No  Physical Activity: Inactive   Days of Exercise per Week: 0 days   Minutes of Exercise per Session: 0 min  Stress: No Stress Concern Present   Feeling of Stress : Not at all  Social Connections: Moderately Isolated   Frequency of Communication with Friends and Family: More than three times a week   Frequency of Social Gatherings with Friends and Family: Once a week   Attends Religious Services: Never   Marine scientist or Organizations: No   Attends Music therapist: Never   Marital Status: Married   No Known Allergies Family History  Problem Relation Age of Onset   Diabetes Mother    Stroke Brother    Hearing loss Brother    Colon cancer Neg Hx    Rectal cancer Neg Hx    Stomach cancer Neg Hx    Esophageal cancer Neg Hx      Current Outpatient Medications (Cardiovascular):    amLODipine (NORVASC) 10 MG tablet, Take 1 tablet (10 mg total) by mouth daily.   atorvastatin (LIPITOR) 40 MG tablet, Take 1 tablet (40 mg total) by mouth daily.   metoprolol tartrate (LOPRESSOR) 50 MG tablet, Take 1 tablet (50 mg total) by mouth 2 (two) times daily.   Current Outpatient Medications (Analgesics):    acetaminophen (TYLENOL) 500 MG tablet, Take 1,000 mg by mouth every 6 (six) hours as needed for moderate pain.     allopurinol (ZYLOPRIM) 100 MG tablet, Take 2 tablets (200 mg total) by mouth daily.   aspirin EC 81 MG tablet, Take 81 mg by mouth daily.   colchicine 0.6 MG tablet, Take 1 tablet (0.6 mg total) by mouth daily.   oxyCODONE (ROXICODONE) 5 MG immediate release tablet, Take 1 tablet (5 mg total) by mouth every 4 (four) hours as needed for severe pain.   Current Outpatient Medications (Other):    doxycycline (VIBRA-TABS) 100 MG tablet, Take 1 tablet (100 mg total) by mouth 2 (two) times daily.   cholecalciferol (VITAMIN D3) 25 MCG (1000 UNIT) tablet, Take  1,000 Units by mouth daily.   hydroxypropyl methylcellulose / hypromellose (ISOPTO TEARS / GONIOVISC) 2.5 % ophthalmic solution, Place 1-2 drops into both eyes 3 (three) times daily as needed (dry/irritated eyes.).   lactulose (CONSTULOSE) 10 GM/15ML solution, Take 30 mLs (20 g total) by mouth daily. Take 15-30 ml's daily   meclizine (ANTIVERT) 25 MG tablet, Take 1 tablet (25 mg total) by mouth 2 (two) times daily as needed for up to 20 doses for dizziness.   Multiple Vitamin (MULTIVITAMIN WITH MINERALS) TABS tablet, Take 1 tablet by mouth daily.   MYRBETRIQ 25 MG TB24 tablet, Take 25 mg by mouth daily.   Potassium 99 MG TABS, Take 99 mg by mouth daily.     Review of Systems:  No headache, visual changes, nausea, vomiting, diarrhea, constipation, dizziness, abdominal pain, skin rash, fevers, chills, night sweats, weight loss, swollen lymph nodes,  chest pain, shortness of breath, mood changes. POSITIVE muscle aches, body aches, joint swelling  Objective  Blood pressure (!) 142/76, pulse 61, weight 258 lb (117 kg), SpO2 95 %.   General: No apparent distress alert and oriented x3 mood and affect normal, dressed appropriately.  HEENT: Pupils equal, extraocular movements intact  Respiratory: Patient's speak in full sentences and does not appear short of breath  Cardiovascular: 1+ lower extremity edema, non tender, no erythema  Gait severely  antalgic Severe swelling noted of the right knee.  Limited range of motion in flexion extension noted.  Patient does have instability with valgus and varus force.  Procedure: Real-time Ultrasound Guided Injection of right knee Device: GE Logiq Q7 Ultrasound guided injection is preferred based studies that show increased duration, increased effect, greater accuracy, decreased procedural pain, increased response rate, and decreased cost with ultrasound guided versus blind injection.  Verbal informed consent obtained.  Time-out conducted.  Noted no overlying erythema, induration, or other signs of local infection.  Skin prepped in a sterile fashion.  Local anesthesia: Topical Ethyl chloride.  With sterile technique and under real time ultrasound guidance: With a 22-gauge 2 inch needle patient was injected with 4 cc of 0.5% Marcaine and aspirated 50 cc of cloudy synovial fluid then injected 1 cc of Kenalog 40 mg/dL. This was from a superior lateral approach.  Completed without difficulty  Pain immediately resolved suggesting accurate placement of the medication.  Advised to call if fevers/chills, erythema, induration, drainage, or persistent bleeding.  Impression: Technically successful ultrasound guided injection.   Impression and Recommendations:     The above documentation has been reviewed and is accurate and complete Lyndal Pulley, DO

## 2021-04-28 NOTE — Assessment & Plan Note (Signed)
Chronic problem, patient does have more of a cloudy appearance of the aspiration.  We will send in doxycycline with it being a holiday weekend.  Could send to lab but do not think it would change medical management.  Has also had what appeared to be more than gout.  He does have colchicine.  Encourage patient to monitor closely with patient's chronic kidney disease as well.  Discussed icing regimen and home exercises.  Follow-up with me again in 6 to 8 weeks.  Worsening pain to seek medical attention immediately

## 2021-05-11 ENCOUNTER — Other Ambulatory Visit: Payer: Self-pay

## 2021-05-11 ENCOUNTER — Ambulatory Visit (INDEPENDENT_AMBULATORY_CARE_PROVIDER_SITE_OTHER): Payer: Medicare Other

## 2021-05-11 DIAGNOSIS — Z23 Encounter for immunization: Secondary | ICD-10-CM | POA: Diagnosis not present

## 2021-05-11 NOTE — Progress Notes (Signed)
Pt was given HD vacc w/o any complications.

## 2021-05-18 ENCOUNTER — Ambulatory Visit: Payer: Medicare Other

## 2021-06-04 NOTE — Progress Notes (Signed)
Zach Deborrah [place] [place] [address] [place] Phone: [phone] Subjective:   [name], am serving as a scribe for Dr. Hulan Saas. This visit occurred during the SARS-CoV-2 public health emergency.  Safety protocols were in place, including screening questions prior to the visit, additional usage of staff PPE, and extensive cleaning of exam room while observing appropriate contact time as indicated for disinfecting solutions.   I'm seeing this patient by the request  of:  Biagio Borg, MD  CC: Right knee pain follow-up  QIO:[id number]  04/28/2021 Chronic problem, patient does have more of a cloudy appearance of the aspiration.  We will send in doxycycline with it being a holiday weekend.  Could send to lab but do not think it would change medical management.  Has also had what appeared to be more than gout.  He does have colchicine.  Encourage patient to monitor closely with patient's chronic kidney disease as well.  Discussed icing regimen and home exercises.  Follow-up with me again in 6 to 8 weeks.  Worsening pain to seek medical attention immediately  Updated 06/09/2021 [name] is a 70 y.o. male coming in with complaint of bilateral knee pain. Same pain not better or worse. Has a little swelling.  Patient states still having discomfort.  Looking for anything else that can be done.  We have aspirated patient's knee on multiple occasions.  Patient was put on doxycycline secondary to the cloudy appearance last time.  Patient states no fevers, chills, any abnormal weight loss.  Patient still states that the pain does affect her daily activity significantly.       Past Medical History:  Diagnosis Date   Anemia    normal Fe, nl B12, nl retic, nl EPO July '13   Aortic regurgitation    Moderate AI 04/17/19 echo   Blood transfusion without reported diagnosis    BPH (benign prostatic hyperplasia)    Chronic back pain    Chronic kidney  disease    CKD III, obstructive nephropathy   Diverticulosis    Dysuria    Elevated PSA, greater than or equal to 20 ng/ml June '13   PSA 107   Hemorrhoids, internal, with bleeding, prolapse 09/19/2014   Hyperlipidemia 05/29/2014   Hypertension    Obstructive uropathy 11/27/2015   Pre-diabetes    per patient "reduced sugar intake"   Renal insufficiency    Tuberculosis    h/o PPD +   UTI (urinary tract infection)    Vitamin D deficiency 09/13/2017   Past Surgical History:  Procedure Laterality Date   CARPAL TUNNEL RELEASE Right    COLONOSCOPY     HEMORRHOID BANDING     INSERTION OF MESH N/A 10/17/2019   Procedure: Insertion Of Mesh;  Surgeon: Ralene Ok, MD;  Location: [facility];  Service: General;  Laterality: N/A;   SPLENECTOMY     XI ROBOTIC ASSISTED SIMPLE PROSTATECTOMY N/A 03/09/2019   Procedure: XI ROBOTIC ASSISTED SIMPLE PROSTATECTOMY;  Surgeon: Cleon Gustin, MD;  Location: [facility];  Service: Urology;  Laterality: N/A;   XI ROBOTIC ASSISTED VENTRAL HERNIA N/A 10/17/2019   Procedure: XI ROBOTIC ASSISTED INCISIONAL HERNIA REPAIR WITH MESH;  Surgeon: Ralene Ok, MD;  Location: [facility];  Service: General;  Laterality: N/A;   Social History   Socioeconomic History   Marital status: Single    Spouse name: Not on file   Number of children: 2   Years of education: 84  Highest education level: Some college, no degree  Occupational History   Occupation: Lobbyist: [employer]    Comment: unemployed  Tobacco Use   Smoking status: Former    Types: Cigarettes    Quit date: 02/26/1980    Years since quitting: 41.3   Smokeless tobacco: Never  Vaping Use   Vaping Use: Never used  Substance and Sexual Activity   Alcohol use: No    Alcohol/week: 0.0 standard drinks   Drug use: No   Sexual activity: Not Currently  Other Topics Concern   Not on file  Social History Narrative   Native of [place], raised poor farming community. . To [place] '78 - finished  College, all but Arts administrator. Married - wife in [place], blocked from [place] to [place]. 1 son '90; 1 dtr '93. 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 Resources depleted.   Right handed.   Caffeine Hot daily one daily.   Social Determinants of Health   Financial Resource Strain: Medium Risk   Difficulty of Paying Living Expenses: Somewhat hard  Food Insecurity: Food Insecurity Present   Worried About Running Out of Food in the Last Year: Sometimes true   Ran Out of Food in the Last Year: Never true  Transportation Needs: No Transportation Needs   Lack of Transportation (Medical): No   Lack of Transportation (Non-Medical): No  Physical Activity: Inactive   Days of Exercise per Week: 0 days   Minutes of Exercise per Session: 0 min  Stress: No Stress Concern Present   Feeling of Stress : Not at all  Social Connections: Moderately Isolated   Frequency of Communication with Friends and Family: More than three times a week   Frequency of Social Gatherings with Friends and Family: Once a week   Attends Religious Services: Never   Marine scientist or Organizations: No   Attends Music therapist: Never   Marital Status: Married   No Known Allergies Family History  Problem Relation Age of Onset   Diabetes Mother    Stroke Brother    Hearing loss Brother    Colon cancer Neg Hx    Rectal cancer Neg Hx    Stomach cancer Neg Hx    Esophageal cancer Neg Hx      Current Outpatient Medications (Cardiovascular):    amLODipine (NORVASC) 10 MG tablet, Take 1 tablet (10 mg total) by mouth daily.   atorvastatin (LIPITOR) 40 MG tablet, Take 1 tablet (40 mg total) by mouth daily.   metoprolol tartrate (LOPRESSOR) 50 MG tablet, Take 1 tablet (50 mg total) by mouth 2 (two) times daily.   Current Outpatient Medications (Analgesics):    acetaminophen (TYLENOL) 500 MG tablet, Take 1,000 mg by mouth every 6 (six) hours as needed for moderate  pain.    allopurinol (ZYLOPRIM) 100 MG tablet, Take 2 tablets (200 mg total) by mouth daily.   aspirin EC 81 MG tablet, Take 81 mg by mouth daily.   colchicine 0.6 MG tablet, Take 1 tablet (0.6 mg total) by mouth daily.   oxyCODONE (ROXICODONE) 5 MG immediate release tablet, Take 1 tablet (5 mg total) by mouth every 4 (four) hours as needed for severe pain.   Current Outpatient Medications (Other):    cholecalciferol (VITAMIN D3) 25 MCG (1000 UNIT) tablet, Take 1,000 Units by mouth daily.   doxycycline (VIBRA-TABS) 100 MG tablet, Take 1 tablet (100 mg total) by mouth 2 (two) times daily.  hydroxypropyl methylcellulose / hypromellose (ISOPTO TEARS / GONIOVISC) 2.5 % ophthalmic solution, Place 1-2 drops into both eyes 3 (three) times daily as needed (dry/irritated eyes.).   lactulose (CONSTULOSE) 10 GM/15ML solution, Take 30 mLs (20 g total) by mouth daily. Take 15-30 ml's daily   meclizine (ANTIVERT) 25 MG tablet, Take 1 tablet (25 mg total) by mouth 2 (two) times daily as needed for up to 20 doses for dizziness.   Multiple Vitamin (MULTIVITAMIN WITH MINERALS) TABS tablet, Take 1 tablet by mouth daily.   MYRBETRIQ 25 MG TB24 tablet, Take 25 mg by mouth daily.   Potassium 99 MG TABS, Take 99 mg by mouth daily.    Reviewed prior external information including notes and imaging from  primary care provider As well as notes that were available from care everywhere and other healthcare systems.  Past medical history, social, surgical and family history all reviewed in electronic medical record.  No pertanent information unless stated regarding to the chief complaint.   Review of Systems:  No headache, visual changes, nausea, vomiting, diarrhea, constipation, dizziness, abdominal pain, skin rash, fevers, chills, night sweats, weight loss, swollen lymph nodes, body aches, joint swelling, chest pain, shortness of breath, mood changes. POSITIVE muscle aches  Objective  Blood pressure 128/78, pulse  64, height 6' (1.829 m), weight 264 lb (119.7 kg), SpO2 95 %.   General: No apparent distress alert and oriented x3 mood and affect normal, dressed appropriately.  HEENT: Pupils equal, extraocular movements intact  Respiratory: Patient's speak in full sentences and does not appear short of breath  Cardiovascular: No lower extremity edema, non tender, no erythema  Gait antalgic Right knee exam still has swelling noted.  Lacks last 10 degrees noted.  No warmness to touch.  Feeling of instability with valgus and varus force.  Positive Spurling's.  Procedure: Real-time Ultrasound Guided Injection of right knee Device: GE Logiq Q7 Ultrasound guided injection is preferred based studies that show increased duration, increased effect, greater accuracy, decreased procedural pain, increased response rate, and decreased cost with ultrasound guided versus blind injection.  Verbal informed consent obtained.  Time-out conducted.  Noted no overlying erythema, induration, or other signs of local infection.  Skin prepped in a sterile fashion.  Local anesthesia: Topical Ethyl chloride.  With sterile technique and under real time ultrasound guidance: With a 22-gauge 2 inch needle patient was injected with 4 cc of 0.5% Marcaine and aspirated 65 cc of straw like colored fluid then injected 1 cc of Kenalog 40 mg/dL. This was from a superior lateral approach.  Completed without difficulty  Pain immediately resolved suggesting accurate placement of the medication.  Advised to call if fevers/chills, erythema, induration, drainage, or persistent bleeding.  Images permanently stored and available for review in the ultrasound unit.  Impression: Technically successful ultrasound guided injection.   Impression and Recommendations:     The above documentation has been reviewed and is accurate and complete Lyndal Pulley, DO

## 2021-06-09 ENCOUNTER — Other Ambulatory Visit: Payer: Self-pay

## 2021-06-09 ENCOUNTER — Ambulatory Visit: Payer: Self-pay

## 2021-06-09 ENCOUNTER — Ambulatory Visit (INDEPENDENT_AMBULATORY_CARE_PROVIDER_SITE_OTHER): Payer: Medicare Other | Admitting: Family Medicine

## 2021-06-09 VITALS — BP 128/78 | HR 64 | Ht 72.0 in | Wt 264.0 lb

## 2021-06-09 DIAGNOSIS — M25561 Pain in right knee: Secondary | ICD-10-CM

## 2021-06-09 DIAGNOSIS — M170 Bilateral primary osteoarthritis of knee: Secondary | ICD-10-CM | POA: Diagnosis not present

## 2021-06-09 DIAGNOSIS — G8929 Other chronic pain: Secondary | ICD-10-CM | POA: Diagnosis not present

## 2021-06-09 NOTE — Assessment & Plan Note (Signed)
Has known arthritic changes.  Patient continues to have reaccumulation of the fluid noted.  Patient likely does not have any signs of infectious etiology at this time.  I do think that patient needs some type of potential surgical intervention at this point.  Patient does want to avoid replacement.  Did have an MRI in March.  We will send patient to orthopedic surgery to evaluation for the possibility of a arthroscopic procedure.

## 2021-06-09 NOTE — Patient Instructions (Signed)
Good to see you! Ref to Dr. Rhona Raider See you again in 6-8 weeks

## 2021-06-12 ENCOUNTER — Encounter: Payer: Self-pay | Admitting: Internal Medicine

## 2021-06-12 ENCOUNTER — Ambulatory Visit (INDEPENDENT_AMBULATORY_CARE_PROVIDER_SITE_OTHER): Payer: Medicare Other | Admitting: Internal Medicine

## 2021-06-12 ENCOUNTER — Other Ambulatory Visit: Payer: Self-pay

## 2021-06-12 ENCOUNTER — Telehealth: Payer: Self-pay

## 2021-06-12 VITALS — BP 122/76 | HR 56 | Temp 97.7°F | Ht 72.0 in | Wt 261.0 lb

## 2021-06-12 DIAGNOSIS — M7021 Olecranon bursitis, right elbow: Secondary | ICD-10-CM | POA: Diagnosis not present

## 2021-06-12 DIAGNOSIS — E7800 Pure hypercholesterolemia, unspecified: Secondary | ICD-10-CM | POA: Diagnosis not present

## 2021-06-12 DIAGNOSIS — R7309 Other abnormal glucose: Secondary | ICD-10-CM

## 2021-06-12 DIAGNOSIS — N1831 Chronic kidney disease, stage 3a: Secondary | ICD-10-CM

## 2021-06-12 DIAGNOSIS — I10 Essential (primary) hypertension: Secondary | ICD-10-CM

## 2021-06-12 DIAGNOSIS — E559 Vitamin D deficiency, unspecified: Secondary | ICD-10-CM | POA: Diagnosis not present

## 2021-06-12 DIAGNOSIS — Z0001 Encounter for general adult medical examination with abnormal findings: Secondary | ICD-10-CM | POA: Diagnosis not present

## 2021-06-12 DIAGNOSIS — E538 Deficiency of other specified B group vitamins: Secondary | ICD-10-CM | POA: Diagnosis not present

## 2021-06-12 LAB — CBC WITH DIFFERENTIAL/PLATELET
Basophils Absolute: 0.1 10*3/uL (ref 0.0–0.1)
Basophils Relative: 0.7 % (ref 0.0–3.0)
Eosinophils Absolute: 0.1 10*3/uL (ref 0.0–0.7)
Eosinophils Relative: 1 % (ref 0.0–5.0)
HCT: 44.1 % (ref 39.0–52.0)
Hemoglobin: 14.3 g/dL (ref 13.0–17.0)
Lymphocytes Relative: 46.7 % — ABNORMAL HIGH (ref 12.0–46.0)
Lymphs Abs: 3.4 10*3/uL (ref 0.7–4.0)
MCHC: 32.4 g/dL (ref 30.0–36.0)
MCV: 88 fl (ref 78.0–100.0)
Monocytes Absolute: 0.5 10*3/uL (ref 0.1–1.0)
Monocytes Relative: 6.9 % (ref 3.0–12.0)
Neutro Abs: 3.2 10*3/uL (ref 1.4–7.7)
Neutrophils Relative %: 44.7 % (ref 43.0–77.0)
Platelets: 261 10*3/uL (ref 150.0–400.0)
RBC: 5.01 Mil/uL (ref 4.22–5.81)
RDW: 14.8 % (ref 11.5–15.5)
WBC: 7.2 10*3/uL (ref 4.0–10.5)

## 2021-06-12 LAB — HEPATIC FUNCTION PANEL
ALT: 14 U/L (ref 0–53)
AST: 18 U/L (ref 0–37)
Albumin: 4.1 g/dL (ref 3.5–5.2)
Alkaline Phosphatase: 105 U/L (ref 39–117)
Bilirubin, Direct: 0.2 mg/dL (ref 0.0–0.3)
Total Bilirubin: 0.8 mg/dL (ref 0.2–1.2)
Total Protein: 7.3 g/dL (ref 6.0–8.3)

## 2021-06-12 LAB — BASIC METABOLIC PANEL
BUN: 28 mg/dL — ABNORMAL HIGH (ref 6–23)
CO2: 35 mEq/L — ABNORMAL HIGH (ref 19–32)
Calcium: 9.1 mg/dL (ref 8.4–10.5)
Chloride: 100 mEq/L (ref 96–112)
Creatinine, Ser: 1.73 mg/dL — ABNORMAL HIGH (ref 0.40–1.50)
GFR: 39.38 mL/min — ABNORMAL LOW (ref >60.00)
Glucose, Bld: 76 mg/dL (ref 70–99)
Potassium: 3.4 mEq/L — ABNORMAL LOW (ref 3.5–5.1)
Sodium: 144 mEq/L (ref 135–145)

## 2021-06-12 LAB — PSA: PSA: 0.42 ng/mL (ref 0.10–4.00)

## 2021-06-12 LAB — LIPID PANEL
Cholesterol: 159 mg/dL (ref 0–200)
HDL: 60.9 mg/dL (ref >39.00)
LDL Cholesterol: 86 mg/dL (ref 0–99)
NonHDL: 98.19
Total CHOL/HDL Ratio: 3
Triglycerides: 62 mg/dL (ref 0.0–149.0)
VLDL: 12.4 mg/dL (ref 0.0–40.0)

## 2021-06-12 LAB — URINALYSIS, ROUTINE W REFLEX MICROSCOPIC
Bilirubin Urine: NEGATIVE
Hgb urine dipstick: NEGATIVE
Ketones, ur: NEGATIVE
Leukocytes,Ua: NEGATIVE
Nitrite: NEGATIVE
RBC / HPF: NONE SEEN (ref 0–?)
Specific Gravity, Urine: 1.01 (ref 1.000–1.030)
Total Protein, Urine: NEGATIVE
Urine Glucose: NEGATIVE
Urobilinogen, UA: 0.2 (ref 0.0–1.0)
pH: 7.5 (ref 5.0–8.0)

## 2021-06-12 LAB — HEMOGLOBIN A1C: Hgb A1c MFr Bld: 6 % (ref 4.6–6.5)

## 2021-06-12 LAB — VITAMIN D 25 HYDROXY (VIT D DEFICIENCY, FRACTURES): VITD: 46.12 ng/mL (ref 30.00–100.00)

## 2021-06-12 LAB — VITAMIN B12: Vitamin B-12: 354 pg/mL (ref 211–911)

## 2021-06-12 LAB — TSH: TSH: 1.92 u[IU]/mL (ref 0.35–5.50)

## 2021-06-12 MED ORDER — TRAMADOL HCL 50 MG PO TABS: 50.0000 mg | ORAL_TABLET | Freq: Four times a day (QID) | ORAL | 0 refills | Status: DC | PRN

## 2021-06-12 NOTE — Progress Notes (Signed)
Patient ID: [name], male   DOB: [birth year], 71 y.o.   MRN: [id number]         Chief Complaint:: wellness exam and Follow-up Right elbow pain, hyperglycemia, ckd, hld       HPI:  [name] is a 71 y.o. male here for wellness exam; declines covid booster, shingrix, o/w up to date                        Also struggling with end stage right knee condition, and recent left thumb injury, has been referred to ortho.  Also c/o right elbow tip pain swelling mild constant for the past 2 wks, no known trauma or prior hx of same.  Worse to lie on the right side, nothing else makes better or worse.  Pt denies chest pain, increased sob or doe, wheezing, orthopnea, PND, increased LE swelling, palpitations, dizziness or syncope.   Pt denies polydipsia, polyuria, or new focal neuro s/s.   Pt denies fever, wt loss, night sweats, loss of appetite, or other constitutional symptoms  No other new complaints   Wt down 3 lbs with better diet.  Wt Readings from Last 3 Encounters:  06/12/21 261 lb (118.4 kg)  06/09/21 264 lb (119.7 kg)  04/28/21 258 lb (117 kg)   BP Readings from Last 3 Encounters:  06/12/21 122/76  06/09/21 128/78  04/28/21 (!) 142/76   Immunization History  Administered Date(s) Administered   Fluad Quad(high Dose 65+) 02/21/2019, 05/12/2020, 05/11/2021   Influenza Whole 06/09/1999, 05/18/2007, 03/30/2010   Influenza, High Dose Seasonal PF 04/08/2016, 03/01/2017, 03/24/2018   Influenza,inj,Quad PF,6+ Mos 03/13/2015   Influenza-Unspecified 05/07/2014   PFIZER(Purple Top)SARS-COV-2 Vaccination 08/03/2019, 08/28/2019   Pneumococcal Conjugate-13 11/27/2015   Pneumococcal Polysaccharide-23 10/20/2016   Tdap 12/04/2013   There are no preventive care reminders to display for this patient.     Past Medical History:  Diagnosis Date   Anemia    normal Fe, nl B12, nl retic, nl EPO July '13   Aortic regurgitation    Moderate AI 04/17/19 echo   Blood transfusion without reported diagnosis     BPH (benign prostatic hyperplasia)    Chronic back pain    Chronic kidney disease    CKD III, obstructive nephropathy   Diverticulosis    Dysuria    Elevated PSA, greater than or equal to 20 ng/ml June '13   PSA 107   Hemorrhoids, internal, with bleeding, prolapse 09/19/2014   Hyperlipidemia 05/29/2014   Hypertension    Obstructive uropathy 11/27/2015   Pre-diabetes    per patient "reduced sugar intake"   Renal insufficiency    Tuberculosis    h/o PPD +   UTI (urinary tract infection)    Vitamin D deficiency 09/13/2017   Past Surgical History:  Procedure Laterality Date   CARPAL TUNNEL RELEASE Right    COLONOSCOPY     HEMORRHOID BANDING     INSERTION OF MESH N/A 10/17/2019   Procedure: Insertion Of Mesh;  Surgeon: Ralene Ok, MD;  Location: [facility];  Service: General;  Laterality: N/A;   SPLENECTOMY     XI ROBOTIC ASSISTED SIMPLE PROSTATECTOMY N/A 03/09/2019   Procedure: XI ROBOTIC ASSISTED SIMPLE PROSTATECTOMY;  Surgeon: Cleon Gustin, MD;  Location: [facility];  Service: Urology;  Laterality: N/A;   XI ROBOTIC ASSISTED VENTRAL HERNIA N/A 10/17/2019   Procedure: XI ROBOTIC ASSISTED INCISIONAL HERNIA REPAIR WITH MESH;  Surgeon: Ralene Ok, MD;  Location: [facility];  Service:  General;  Laterality: N/A;    reports that he quit smoking about 41 years ago. His smoking use included cigarettes. He has never used smokeless tobacco. He reports that he does not drink alcohol and does not use drugs. family history includes Diabetes in his mother; Hearing loss in his brother; Stroke in his brother. No Known Allergies Current Outpatient Medications on File Prior to Visit  Medication Sig Dispense Refill   acetaminophen (TYLENOL) 500 MG tablet Take 1,000 mg by mouth every 6 (six) hours as needed for moderate pain.      allopurinol (ZYLOPRIM) 100 MG tablet Take 2 tablets (200 mg total) by mouth daily. 180 tablet 3   amLODipine (NORVASC) 10 MG tablet Take 1 tablet (10 mg total) by mouth  daily. 90 tablet 3   aspirin EC 81 MG tablet Take 81 mg by mouth daily.     atorvastatin (LIPITOR) 40 MG tablet Take 1 tablet (40 mg total) by mouth daily. 90 tablet 3   cholecalciferol (VITAMIN D3) 25 MCG (1000 UNIT) tablet Take 1,000 Units by mouth daily.     colchicine 0.6 MG tablet Take 1 tablet (0.6 mg total) by mouth daily. 30 tablet 5   doxycycline (VIBRA-TABS) 100 MG tablet Take 1 tablet (100 mg total) by mouth 2 (two) times daily. 42 tablet 0   hydroxypropyl methylcellulose / hypromellose (ISOPTO TEARS / GONIOVISC) 2.5 % ophthalmic solution Place 1-2 drops into both eyes 3 (three) times daily as needed (dry/irritated eyes.). 15 mL 2   lactulose (CONSTULOSE) 10 GM/15ML solution Take 30 mLs (20 g total) by mouth daily. Take 15-30 ml's daily 948 mL 0   meclizine (ANTIVERT) 25 MG tablet Take 1 tablet (25 mg total) by mouth 2 (two) times daily as needed for up to 20 doses for dizziness. 20 tablet 0   metoprolol tartrate (LOPRESSOR) 50 MG tablet Take 1 tablet (50 mg total) by mouth 2 (two) times daily. 180 tablet 3   Multiple Vitamin (MULTIVITAMIN WITH MINERALS) TABS tablet Take 1 tablet by mouth daily.     MYRBETRIQ 25 MG TB24 tablet Take 25 mg by mouth daily.     Potassium 99 MG TABS Take 99 mg by mouth daily.      No current facility-administered medications on file prior to visit.        ROS:  All others reviewed and negative.  Objective        PE:  BP 122/76 (BP Location: Left Arm, Patient Position: Sitting, Cuff Size: Large)    Pulse (!) 56    Temp 97.7 F (36.5 C) (Oral)    Ht 6' (1.829 m)    Wt 261 lb (118.4 kg)    SpO2 98%    BMI 35.40 kg/m                 Constitutional: Pt appears in NAD               HENT: Head: NCAT.                Right Ear: External ear normal.                 Left Ear: External ear normal.                Eyes: . Pupils are equal, round, and reactive to light. Conjunctivae and EOM are normal               Nose: without d/c or deformity  Neck: Neck supple. Gross normal ROM               Cardiovascular: Normal rate and regular rhythm.                 Pulmonary/Chest: Effort normal and breath sounds without rales or wheezing.                Abd:  Soft, NT, ND, + BS, no organomegaly               Right elbow olecranon with 1+ swelling tender               Neurological: Pt is alert. At baseline orientation, motor grossly intact               Skin: Skin is warm. No rashes, no other new lesions, LE edema - none               Psychiatric: Pt behavior is normal without agitation   Micro: none  Cardiac tracings I have personally interpreted today:  none  Pertinent Radiological findings (summarize): none   Lab Results  Component Value Date   WBC 7.2 06/12/2021   HGB 14.3 06/12/2021   HCT 44.1 06/12/2021   PLT 261.0 06/12/2021   GLUCOSE 76 06/12/2021   CHOL 159 06/12/2021   TRIG 62.0 06/12/2021   HDL 60.90 06/12/2021   LDLCALC 86 06/12/2021   ALT 14 06/12/2021   AST 18 06/12/2021   NA 144 06/12/2021   K 3.4 (L) 06/12/2021   CL 100 06/12/2021   CREATININE 1.73 (H) 06/12/2021   BUN 28 (H) 06/12/2021   CO2 35 (H) 06/12/2021   TSH 1.92 06/12/2021   PSA 0.42 06/12/2021   INR 1.2 12/05/2019   HGBA1C 6.0 06/12/2021   MICROALBUR 2.7 (H) 12/10/2020   Assessment/Plan:  [name] is a 71 y.o. Black or African American [2] male with  has a past medical history of Anemia, Aortic regurgitation, Blood transfusion without reported diagnosis, BPH (benign prostatic hyperplasia), Chronic back pain, Chronic kidney disease, Diverticulosis, Dysuria, Elevated PSA, greater than or equal to 20 ng/ml (June '13), Hemorrhoids, internal, with bleeding, prolapse (09/19/2014), Hyperlipidemia (05/29/2014), Hypertension, Obstructive uropathy (11/27/2015), Pre-diabetes, Renal insufficiency, Tuberculosis, UTI (urinary tract infection), and Vitamin D deficiency (09/13/2017).  Vitamin D deficiency Last vitamin D Lab Results  Component Value Date    VD25OH 49.22 12/10/2020   Stable, cont oral replacement   Encounter for well adult exam with abnormal findings Age and sex appropriate education and counseling updated with regular exercise and diet Referrals for preventative services - none needed Immunizations addressed - declines covid boostser, shingirx Smoking counseling  - none needed Evidence for depression or other mood disorder - none significant Most recent labs reviewed. I have personally reviewed and have noted: 1) the patient's medical and social history 2) The patient's current medications and supplements 3) The patient's height, weight, and BMI have been recorded in the chart   Abnormal glucose Lab Results  Component Value Date   HGBA1C 6.0 06/12/2021   Stable, pt to continue current medical treatment  - diet   CKD (chronic kidney disease) stage 3, GFR 30-59 ml/min (HCC) Lab Results  Component Value Date   CREATININE 1.73 (H) 06/12/2021   Stable overall, cont to avoid nephrotoxins   Hyperlipidemia Lab Results  Component Value Date   LDLCALC 86 06/12/2021   Stable, pt to continue current statin lipitor 40   Essential hypertension BP Readings from Last  3 Encounters:  06/12/21 122/76  06/09/21 128/78  04/28/21 (!) 142/76   Stable, pt to continue medical treatment amlodipine,lopressor   Olecranon bursitis, right elbow Uncontrolled, mild to mod, for volt gel pr,  to f/u any worsening symptoms or concerns  Followup: Return in about 6 months (around 12/10/2021).  Cathlean Cower, MD 06/13/2021 9:06 PM [facility]

## 2021-06-12 NOTE — Patient Instructions (Signed)

## 2021-06-12 NOTE — Assessment & Plan Note (Signed)
Last vitamin D Lab Results  Component Value Date   VD25OH 49.22 12/10/2020   Stable, cont oral replacement

## 2021-06-12 NOTE — Telephone Encounter (Signed)
Patient forgot to request something for right knee pain during OV. Asking if something can be sent to his pharmacy

## 2021-06-12 NOTE — Telephone Encounter (Signed)
[facility] for tramadol prn- done erx

## 2021-06-13 ENCOUNTER — Encounter: Payer: Self-pay | Admitting: Internal Medicine

## 2021-06-13 DIAGNOSIS — M7021 Olecranon bursitis, right elbow: Secondary | ICD-10-CM | POA: Insufficient documentation

## 2021-06-13 NOTE — Assessment & Plan Note (Signed)
Lab Results  Component Value Date   CREATININE 1.73 (H) 06/12/2021   Stable overall, cont to avoid nephrotoxins

## 2021-06-13 NOTE — Assessment & Plan Note (Signed)
Age and sex appropriate education and counseling updated with regular exercise and diet Referrals for preventative services - none needed Immunizations addressed - declines covid boostser, shingirx Smoking counseling  - none needed Evidence for depression or other mood disorder - none significant Most recent labs reviewed. I have personally reviewed and have noted: 1) the patient's medical and social history 2) The patient's current medications and supplements 3) The patient's height, weight, and BMI have been recorded in the chart

## 2021-06-13 NOTE — Assessment & Plan Note (Signed)
Lab Results  Component Value Date   LDLCALC 86 06/12/2021   Stable, pt to continue current statin lipitor 40

## 2021-06-13 NOTE — Assessment & Plan Note (Signed)
Lab Results  Component Value Date   HGBA1C 6.0 06/12/2021   Stable, pt to continue current medical treatment  - diet

## 2021-06-13 NOTE — Assessment & Plan Note (Signed)
BP Readings from Last 3 Encounters:  06/12/21 122/76  06/09/21 128/78  04/28/21 (!) 142/76   Stable, pt to continue medical treatment amlodipine,lopressor

## 2021-06-13 NOTE — Assessment & Plan Note (Signed)
Uncontrolled, mild to mod, for volt gel pr,  to f/u any worsening symptoms or concerns

## 2021-06-15 ENCOUNTER — Ambulatory Visit (INDEPENDENT_AMBULATORY_CARE_PROVIDER_SITE_OTHER): Payer: Medicare Other | Admitting: *Deleted

## 2021-06-15 ENCOUNTER — Other Ambulatory Visit: Payer: Self-pay | Admitting: Internal Medicine

## 2021-06-15 DIAGNOSIS — N1831 Chronic kidney disease, stage 3a: Secondary | ICD-10-CM

## 2021-06-15 DIAGNOSIS — I10 Essential (primary) hypertension: Secondary | ICD-10-CM

## 2021-06-15 NOTE — Chronic Care Management (AMB) (Signed)
Chronic Care Management   CCM RN Visit Note  06/15/2021 Name: [name] MRN: [id number] DOB: 1951/04/20  Subjective: [name] is a 71 y.o. year old male who is a primary care patient of Biagio Borg, MD. The care management team was consulted for assistance with disease management and care coordination needs.    Engaged with patient by telephone for follow up visit in response to provider referral for case management and/or care coordination services.   Consent to Services:  The patient was given information about Chronic Care Management services, agreed to services, and gave verbal consent prior to initiation of services.  Please see initial visit note for detailed documentation.  Patient agreed to services and verbal consent obtained.   Assessment: Review of patient past medical history, allergies, medications, health status, including review of consultants reports, laboratory and other test data, was performed as part of comprehensive evaluation and provision of chronic care management services.   SDOH (Social Determinants of Health) assessments and interventions performed:  SDOH Interventions    Flowsheet Row Most Recent Value  SDOH Interventions   Food Insecurity Interventions Intervention Not Indicated  [denies food insecurity- has been previously provided resources for food acquisition]  Housing Interventions Intervention Not Indicated  [continues living in apartment with roommate]  Transportation Interventions Intervention Not Indicated  [reports continues using friend's vehicle for transportation needs]       CCM Care Plan No Known Allergies  Outpatient Encounter Medications as of 06/15/2021  Medication Sig Note   acetaminophen (TYLENOL) 500 MG tablet Take 1,000 mg by mouth every 6 (six) hours as needed for moderate pain.     allopurinol (ZYLOPRIM) 100 MG tablet Take 2 tablets (200 mg total) by mouth daily.    amLODipine (NORVASC) 10 MG tablet Take 1 tablet (10 mg  total) by mouth daily.    aspirin EC 81 MG tablet Take 81 mg by mouth daily.    atorvastatin (LIPITOR) 40 MG tablet Take 1 tablet (40 mg total) by mouth daily.    cholecalciferol (VITAMIN D3) 25 MCG (1000 UNIT) tablet Take 1,000 Units by mouth daily.    colchicine 0.6 MG tablet Take 1 tablet (0.6 mg total) by mouth daily.    doxycycline (VIBRA-TABS) 100 MG tablet Take 1 tablet (100 mg total) by mouth 2 (two) times daily.    hydroxypropyl methylcellulose / hypromellose (ISOPTO TEARS / GONIOVISC) 2.5 % ophthalmic solution Place 1-2 drops into both eyes 3 (three) times daily as needed (dry/irritated eyes.). 08/07/2020: 08/07/20: Reports uses prn   lactulose (CONSTULOSE) 10 GM/15ML solution Take 30 mLs (20 g total) by mouth daily. Take 15-30 ml's daily    meclizine (ANTIVERT) 25 MG tablet Take 1 tablet (25 mg total) by mouth 2 (two) times daily as needed for up to 20 doses for dizziness. 08/07/2020: 08/07/20: Patient reports taking prn; has not needed recently   metoprolol tartrate (LOPRESSOR) 50 MG tablet Take 1 tablet by mouth twice daily    Multiple Vitamin (MULTIVITAMIN WITH MINERALS) TABS tablet Take 1 tablet by mouth daily.    MYRBETRIQ 25 MG TB24 tablet Take 25 mg by mouth daily. 08/07/2020: 08/07/20: Patient reports he is not currently taking   Potassium 99 MG TABS Take 99 mg by mouth daily.     traMADol (ULTRAM) 50 MG tablet Take 1 tablet (50 mg total) by mouth every 6 (six) hours as needed.    [DISCONTINUED] metoprolol tartrate (LOPRESSOR) 50 MG tablet Take 1 tablet (50 mg total) by mouth 2 (  two) times daily.    No facility-administered encounter medications on file as of 06/15/2021.   Patient Active Problem List   Diagnosis Date Noted   Olecranon bursitis, right elbow 06/13/2021   Rupture of UCL of left thumb 03/18/2021   Aortic atherosclerosis (HCC) 12/10/2020   Chronic pain of right knee 12/10/2020   Bilateral elbow joint pain 07/09/2020   Acute renal failure superimposed on chronic kidney  disease ([place]) 11/12/2019   Dry eyes 11/12/2019   S/P hernia repair 10/17/2019   Abdominal pain 07/06/2019   BPH with obstruction/lower urinary tract symptoms 03/09/2019   Clot retention of urine 02/11/2019   Vitamin D deficiency 09/13/2017   Urinary tract infection without hematuria 07/23/2017   Low back pain 07/03/2017   Dizziness 06/30/2017   Acute gouty arthritis 12/01/2016   Degenerative arthritis of knee, bilateral 11/18/2016   Urinary retention due to benign prostatic hyperplasia 10/24/2016   Mass of right side of neck 10/20/2016   Gout 10/20/2016   Right knee pain 10/12/2016   Hypokalemia 05/28/2016   Encounter for well adult exam with abnormal findings 11/27/2015   Obstructive uropathy 11/27/2015   Elevated PSA 11/27/2015   Acute pyelonephritis 05/27/2015   Generalized bloating 05/27/2015   Chronic constipation 05/27/2015   Chest pain 05/27/2015   AKI (acute kidney injury) ([place]) 05/27/2015   Acute sinus infection 11/22/2014   Cough 09/25/2014   Hemorrhoids, internal, with bleeding, prolapse 09/19/2014   Chronic UTI 05/29/2014   Hyperlipidemia 05/29/2014   Lumbar radiculopathy 05/23/2014   Lumbar stenosis 11/20/2013   Abnormal glucose 11/06/2013   Unspecified hereditary and idiopathic peripheral neuropathy 01/22/2013   Cardiomyopathy due to hypertension ([place]) 12/28/2011   CKD (chronic kidney disease) stage 3, GFR 30-59 ml/min (HCC) 11/24/2011   Hematuria 11/24/2011   Hydronephrosis 11/24/2011   Anemia 11/24/2011   BRADYCARDIA 02/05/2010   TINEA PEDIS 01/29/2010   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   PERIPHERAL EDEMA 01/29/2010   ABNORMAL ELECTROCARDIOGRAM 01/29/2010   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   Osteoarthrosis, generalized, 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   ONYCHOMYCOSIS, TOENAILS 10/02/2007   BPH (benign prostatic hyperplasia) 10/02/2007   Essential hypertension 03/06/2007   Conditions to be addressed/monitored:  HTN and CKD Stage III  Care Plan : RN  Care Manager Plan of Care  Updates made by Knox Royalty, RN since 06/15/2021 12:00 AM     Problem: Chronic Disease Management Needs   Priority: Medium     Long-Range Goal: Development of plan of care for long term chronic disease management   Start Date: 03/12/2021  Expected End Date: 03/12/2022  Priority: Medium  Note:   Current Barriers:  Chronic Disease Management support and education needs related to HTN and CKD Stage III  RNCM Clinical Goal(s):  Patient will demonstrate ongoing health management independence CKD; HTN  through collaboration with RN Care manager, provider, and care team.   Interventions: 1:1 collaboration with primary care provider regarding development and update of comprehensive plan of care as evidenced by provider attestation and co-signature Inter-disciplinary care team collaboration (see longitudinal plan of care) Evaluation of current treatment plan related to  self management and patient's adherence to plan as established by provider Pain assessment updated: chronic (R) knee pain persists- patient reports well managed at home with prescribed medications, re-positioning Falls assessment updated: no new/ recent falls since last reported in Aut[place]; continues to use cane; positive reinforcement provided with encouragement to continue efforts   Chronic Kidney Disease (Status: Goal on Track (progressing): YES.) Long-Term goal Last  practice recorded BP readings:  BP Readings from Last 3 Encounters:  06/12/21 122/76  06/09/21 128/78  04/28/21 (!) 142/76  Most recent eGFR/CrCl: No results found for: EGFR  No components found for: CRCL  Evaluation of current treatment plan related to chronic kidney disease self management and patient's adherence to plan as established by provider      Reviewed prescribed diet renal, low salt, heart healthy Counseled on the importance of exercise goals with target of 150 minutes per week     Discussed complications of poorly  controlled blood pressure such as heart disease, stroke, circulatory complications, vision complications, kidney impairment, sexual dysfunction    Discussed plans with patient for ongoing care management follow up and provided patient with direct contact information for care management team    Discussed the impact of chronic kidney disease on daily life and mental health and acknowledged and normalized feelings of disempowerment, fear, and frustration     Hypertension: (Status: Goal on Track (progressing): YES.) Long-term goal Last practice recorded BP readings:  BP Readings from Last 3 Encounters:  06/12/21 122/76  06/09/21 128/78  04/28/21 (!) 142/76  Most recent eGFR/CrCl: No results found for: EGFR  No components found for: CRCL  Evaluation of current treatment plan related to hypertension self management and patient's adherence to plan as established by provider;   Discussed plans with patient for ongoing care management follow up and provided patient with direct contact information for care management team; Discussed complications of poorly controlled blood pressure such as heart disease, stroke, circulatory complications, vision complications, kidney impairment, sexual dysfunction;  Assessed social determinant of health barriers;  Reviewed recent sports medicine and PCP office visits with patient: reviewed blood pressure, labs for HLD/ CKD and follow up plan: reports has not yet scheduled with orthopedic surgeon, but plans to attend referral visit as recommended by sports medicine provider Confirmed patient obtained flu vaccine for 2022-23 flu/ winter season Confirmed no concerns/ recent changes to medications: patient endorses adherence and denies concerns/ questions Confirmed patient continues to stay active, walk for exercise as allowed around weather conditions and daily knee pain  Patient Goals/Self-Care Activities: As evidenced by review of EHR, collaboration with care team, and  patient reporting during CCM RN CM outreach,  Patient Selvin will: Take medications as prescribed Attend all scheduled provider appointments  Call pharmacy for medication refills  Call provider office for new concerns or questions  Continue to stay active and walk for exercise when possible around safe weather conditions Continue to follow heart healthy, low salt, renal diet Continue to make every effort to prevent falls and will use his cane     Plan: Telephone follow up appointment with care management team member scheduled for:  Monday, September 14, 2021 at 9:00 am The patient has been provided with contact information for the care management team and has been advised to call with any health related questions or concerns  [name], RN, BSN, [place] [phone]: direct office

## 2021-06-15 NOTE — Telephone Encounter (Signed)
Please refill as per office routine med refill policy (all routine meds to be refilled for 3 mo or monthly (per pt preference) up to one year from last visit, then month to month grace period for 3 mo, then further med refills will have to be denied) ? ?

## 2021-06-15 NOTE — Progress Notes (Signed)
Please print and send letter from [place]

## 2021-06-15 NOTE — Patient Instructions (Signed)
Visit Information  [name], thank you for taking time to talk with me today. Please don't hesitate to contact me if I can be of assistance to you before our next scheduled telephone appointment.  Below are the goals we discussed today:  Patient Self-Care Activities: Patient [name] will: Take medications as prescribed Attend all scheduled provider appointments  Call pharmacy for medication refills  Call provider office for new concerns or questions  Continue to stay active and walk for exercise when possible around safe weather conditions Continue to follow heart healthy, low salt, renal diet Continue to make every effort to prevent falls and will use his cane   Our next scheduled telephone follow up visit/ appointment with care management team member is scheduled on:  Monday, September 14, 2021 at 9:00 am  If you need to cancel or re-schedule our visit, please call [phone] and our care guide team will be happy to assist you.   I look forward to hearing about your progress.   [name], RN, BSN, [place] [phone]: direct office  If you are experiencing a Mental Health or [place] or need someone to talk to, please  call the Suicide and Crisis Lifeline: 988 call the [place] National Suicide Prevention Lifeline: 620-048-5767 or TTY: (410)820-5178 TTY 716-566-0276) to talk to a trained counselor call 1-800-273-TALK (toll free, 24 hour hotline) go to [facility] [address], [place] [phone]) call 911   The patient verbalized understanding of instructions, educational materials, and care plan provided today and agreed to receive a mailed copy of patient instructions, educational materials, and care plan  Chronic Knee Pain, Adult Knee pain that lasts longer than 3 months is called chronic knee pain. You may have pain in one or both knees. Symptoms of chronic  knee pain may also include swelling and stiffness. The most common cause is age-related wear and tear (osteoarthritis) of your knee joint. Many conditions can cause chronic knee pain. Treatment depends on the cause. The main treatments are physical therapy and weight loss. It may also be treated with medicines, injections, a knee sleeve or brace, and by using crutches. Rest, ice, pressure (compression), and elevation, also known as RICE therapy, may also be recommended. Follow these instructions at home: If you have a knee sleeve or brace:  Wear the knee sleeve or brace as told by your doctor. Take it off only as told by your doctor. Loosen it if your toes: Tingle. Become numb. Turn cold and blue. Keep it clean. If the sleeve or brace is not waterproof: Do not let it get wet. Ask your doctor if you may take it off when you take a bath or shower. If not, cover it with a watertight covering. Managing pain, stiffness, and swelling   If told, put heat on your knee. Do this as often as told by your doctor. Use the heat source that your doctor recommends, such as a moist heat pack or a heating pad. If you have a removable knee sleeve or brace, take it off as told by your doctor. Place a towel between your skin and the heat source. Leave the heat on for 20-30 minutes. Take off the heat if your skin turns bright red. This is very important. If you cannot feel pain, heat, or cold, you have a greater risk of getting burned. If told, put ice on your knee. To do this: If you have  a removable knee sleeve or brace, take it off as told by your doctor. Put ice in a plastic bag. Place a towel between your skin and the bag. Leave the ice on for 20 minutes, 2-3 times a day. Take off the ice if your skin turns bright red. This is very important. If you cannot feel pain, heat, or cold, you have a greater risk of damage to the area. Move your toes often. Raise the injured area above the level of your heart  while you are sitting or lying down. Activity Avoid activities where both feet leave the ground at the same time (high-impact activities). Examples are running, jumping rope, and doing jumping jacks. Follow the exercise plan that your doctor makes for you. Your doctor may suggest that you: Avoid activities that make knee pain worse. You may need to change the exercises that you do, the sports that you participate in, or your job duties. Wear shoes with cushioned soles. Avoid sports that require running and sudden changes in direction. Do exercises or physical therapy. This is planned to match your needs and your abilities. Do exercises that increase your balance and strength, such as tai chi and yoga. Do not use your injured knee to support your body weight until your doctor says that you can. Use crutches as told by your doctor. Return to your normal activities when your doctor says that it is safe. General instructions Take over-the-counter and prescription medicines only as told by your doctor. If you are overweight, work with your doctor and a food expert (dietitian) to set goals to lose weight. Being overweight can make your knee hurt more. Do not smoke or use any products that contain nicotine or tobacco. If you need help quitting, ask your doctor. Keep all follow-up visits. Contact a doctor if: You have knee pain that is not getting better or gets worse. You are not able to do your exercises due to knee pain. Get help right away if: Your knee swells and the swelling gets worse. You cannot move your knee. You have very bad knee pain. Summary Knee pain that lasts more than 3 months is called chronic knee pain. The main treatments for chronic knee pain are physical therapy and weight loss. You may also need to take medicines, wear a knee sleeve or brace, use crutches, and put ice or heat on your knee. Lose weight if you are overweight. Work with your doctor and a food expert (dietitian)  to help you set goals to lose weight. Being overweight can make your knee hurt more. Follow the exercise plan that your doctor makes for you. This information is not intended to replace advice given to you by your health care provider. Make sure you discuss any questions you have with your health care provider. Document Revised: 11/07/2019 Document Reviewed: 11/07/2019 Elsevier Patient Education  2022 [employer].

## 2021-07-07 DIAGNOSIS — N1831 Chronic kidney disease, stage 3a: Secondary | ICD-10-CM

## 2021-07-07 DIAGNOSIS — I10 Essential (primary) hypertension: Secondary | ICD-10-CM | POA: Diagnosis not present

## 2021-07-20 NOTE — Progress Notes (Signed)
[facility] [place] Phone: [phone] Subjective:   [name], am serving as a scribe for Dr. Hulan Saas.  This visit occurred during the SARS-CoV-2 public health emergency.  Safety protocols were in place, including screening questions prior to the visit, additional usage of staff PPE, and extensive cleaning of exam room while observing appropriate contact time as indicated for disinfecting solutions.    I'm seeing this patient by the request  of:  Biagio Borg, MD  CC: Right knee pain follow-up  NOM:[id number]  06/09/2021 Has known arthritic changes.  Patient continues to have reaccumulation of the fluid noted.  Patient likely does not have any signs of infectious etiology at this time.  I do think that patient needs some type of potential surgical intervention at this point.  Patient does want to avoid replacement.  Did have an MRI in March.  We will send patient to orthopedic surgery to evaluation for the possibility of a arthroscopic procedure.  Updated 07/21/2021 Giorgio Chabot is a 71 y.o. male coming in with complaint of right knee pain. Patient is in constant pain which is worse with bearing weight. Unable to straighten leg when he sits down due to swelling. Patient has not been seen yet by ortho.   Patient was last seen 6 weeks ago for the increase in the amount of swelling again.  Patient was sent to orthopedic surgery to discuss the possibility of patient was a candidate for arthroscopic procedure versus the possibility of true replacement.      Past Medical History:  Diagnosis Date   Anemia    normal Fe, nl B12, nl retic, nl EPO July '13   Aortic regurgitation    Moderate AI 04/17/19 echo   Blood transfusion without reported diagnosis    BPH (benign prostatic hyperplasia)    Chronic back pain    Chronic kidney disease    CKD III, obstructive nephropathy   Diverticulosis    Dysuria    Elevated PSA,  greater than or equal to 20 ng/ml June '13   PSA 107   Hemorrhoids, internal, with bleeding, prolapse 09/19/2014   Hyperlipidemia 05/29/2014   Hypertension    Obstructive uropathy 11/27/2015   Pre-diabetes    per patient "reduced sugar intake"   Renal insufficiency    Tuberculosis    h/o PPD +   UTI (urinary tract infection)    Vitamin D deficiency 09/13/2017   Past Surgical History:  Procedure Laterality Date   CARPAL TUNNEL RELEASE Right    COLONOSCOPY     HEMORRHOID BANDING     INSERTION OF MESH N/A 10/17/2019   Procedure: Insertion Of Mesh;  Surgeon: Ralene Ok, MD;  Location: [facility];  Service: General;  Laterality: N/A;   SPLENECTOMY     XI ROBOTIC ASSISTED SIMPLE PROSTATECTOMY N/A 03/09/2019   Procedure: XI ROBOTIC ASSISTED SIMPLE PROSTATECTOMY;  Surgeon: Cleon Gustin, MD;  Location: [facility];  Service: Urology;  Laterality: N/A;   XI ROBOTIC ASSISTED VENTRAL HERNIA N/A 10/17/2019   Procedure: XI ROBOTIC ASSISTED INCISIONAL HERNIA REPAIR WITH MESH;  Surgeon: Ralene Ok, MD;  Location: [facility];  Service: General;  Laterality: N/A;   Social History   Socioeconomic History   Marital status: Single    Spouse name: Not on file   Number of children: 2   Years of education: 20   Highest education level: Some college, no degree  Occupational History   Occupation: Administrator, sports  Employer: [employer]    Comment: unemployed  Tobacco Use   Smoking status: Former    Types: Cigarettes    Quit date: 02/26/1980    Years since quitting: 41.4   Smokeless tobacco: Never  Vaping Use   Vaping Use: Never used  Substance and Sexual Activity   Alcohol use: No    Alcohol/week: 0.0 standard drinks   Drug use: No   Sexual activity: Not Currently  Other Topics Concern   Not on file  Social History Narrative   Native of [place], raised poor farming community. . To [place] '78 - finished College, all but Arts administrator. Married - wife in [place], blocked from [place] to  [place]. 1 son '90; 1 dtr '93. 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 Resources depleted.   Right handed.   Caffeine Hot daily one daily.   Social Determinants of Health   Financial Resource Strain: Medium Risk   Difficulty of Paying Living Expenses: Somewhat hard  Food Insecurity: No Food Insecurity   Worried About Charity fundraiser in the Last Year: Never true   Ran Out of Food in the Last Year: Never true  Transportation Needs: No Transportation Needs   Lack of Transportation (Medical): No   Lack of Transportation (Non-Medical): No  Physical Activity: Inactive   Days of Exercise per Week: 0 days   Minutes of Exercise per Session: 0 min  Stress: No Stress Concern Present   Feeling of Stress : Not at all  Social Connections: Moderately Isolated   Frequency of Communication with Friends and Family: More than three times a week   Frequency of Social Gatherings with Friends and Family: Once a week   Attends Religious Services: Never   Marine scientist or Organizations: No   Attends Music therapist: Never   Marital Status: Married   No Known Allergies Family History  Problem Relation Age of Onset   Diabetes Mother    Stroke Brother    Hearing loss Brother    Colon cancer Neg Hx    Rectal cancer Neg Hx    Stomach cancer Neg Hx    Esophageal cancer Neg Hx      Current Outpatient Medications (Cardiovascular):    amLODipine (NORVASC) 10 MG tablet, Take 1 tablet (10 mg total) by mouth daily.   atorvastatin (LIPITOR) 40 MG tablet, Take 1 tablet (40 mg total) by mouth daily.   metoprolol tartrate (LOPRESSOR) 50 MG tablet, Take 1 tablet by mouth twice daily   Current Outpatient Medications (Analgesics):    acetaminophen (TYLENOL) 500 MG tablet, Take 1,000 mg by mouth every 6 (six) hours as needed for moderate pain.    allopurinol (ZYLOPRIM) 100 MG tablet, Take 2 tablets (200 mg total) by mouth daily.   aspirin EC 81 MG tablet, Take  81 mg by mouth daily.   colchicine 0.6 MG tablet, Take 1 tablet (0.6 mg total) by mouth daily.   traMADol (ULTRAM) 50 MG tablet, Take 1 tablet (50 mg total) by mouth every 6 (six) hours as needed.   Current Outpatient Medications (Other):    cholecalciferol (VITAMIN D3) 25 MCG (1000 UNIT) tablet, Take 1,000 Units by mouth daily.   doxycycline (VIBRA-TABS) 100 MG tablet, Take 1 tablet (100 mg total) by mouth 2 (two) times daily.   hydroxypropyl methylcellulose / hypromellose (ISOPTO TEARS / GONIOVISC) 2.5 % ophthalmic solution, Place 1-2 drops into both eyes 3 (three) times daily as needed (dry/irritated  eyes.).   lactulose (CONSTULOSE) 10 GM/15ML solution, Take 30 mLs (20 g total) by mouth daily. Take 15-30 ml's daily   meclizine (ANTIVERT) 25 MG tablet, Take 1 tablet (25 mg total) by mouth 2 (two) times daily as needed for up to 20 doses for dizziness.   Multiple Vitamin (MULTIVITAMIN WITH MINERALS) TABS tablet, Take 1 tablet by mouth daily.   MYRBETRIQ 25 MG TB24 tablet, Take 25 mg by mouth daily.   Potassium 99 MG TABS, Take 99 mg by mouth daily.    Reviewed prior external information including notes and imaging from  primary care provider As well as notes that were available from care everywhere and other healthcare systems.  Past medical history, social, surgical and family history all reviewed in electronic medical record.  No pertanent information unless stated regarding to the chief complaint.   Review of Systems:  No headache, visual changes, nausea, vomiting, diarrhea, constipation, dizziness, abdominal pain, skin rash, fevers, chills, night sweats, weight loss, swollen lymph nodes, body aches, joint swelling, chest pain, shortness of breath, mood changes. POSITIVE muscle aches  Objective  Blood pressure 124/82, pulse 62, height 6' (1.829 m), weight 262 lb (118.8 kg), SpO2 97 %.   General: No apparent distress alert and oriented x3 mood and affect normal, dressed appropriately.   HEENT: Pupils equal, extraocular movements intact  Respiratory: Patient's speak in full sentences and does not appear short of breath  Cardiovascular: 1+ lower extremity edema, non tender, no erythema  Gait antalgic using the aid of a cane MSK: Right knee exam continues to have effusion noted.  Lacks the last 15 degrees of flexion.  Patient does have tenderness to palpation diffusely.  No erythema but mildly warm to touch.  Procedure: Real-time Ultrasound Guided Injection of right knee Device: GE Logiq Q7 Ultrasound guided injection is preferred based studies that show increased duration, increased effect, greater accuracy, decreased procedural pain, increased response rate, and decreased cost with ultrasound guided versus blind injection.  Verbal informed consent obtained.  Time-out conducted.  Noted no overlying erythema, induration, or other signs of local infection.  Skin prepped in a sterile fashion.  Local anesthesia: Topical Ethyl chloride.  With sterile technique and under real time ultrasound guidance: With a 22-gauge 2 inch needle patient was injected with 4 cc of 0.5% Marcaine and aspirated 60 cc of straw-colored fluid and then injected 1 cc of Kenalog 40 mg/dL. This was from a superior lateral approach.  Completed without difficulty  Pain immediately resolved suggesting accurate placement of the medication.  Advised to call if fevers/chills, erythema, induration, drainage, or persistent bleeding.  Impression: Technically successful ultrasound guided injection.   Impression and Recommendations:     The above documentation has been reviewed and is accurate and complete Lyndal Pulley, DO

## 2021-07-21 ENCOUNTER — Ambulatory Visit (INDEPENDENT_AMBULATORY_CARE_PROVIDER_SITE_OTHER): Payer: Medicare Other | Admitting: Family Medicine

## 2021-07-21 ENCOUNTER — Ambulatory Visit: Payer: Self-pay

## 2021-07-21 ENCOUNTER — Encounter: Payer: Self-pay | Admitting: Family Medicine

## 2021-07-21 ENCOUNTER — Other Ambulatory Visit: Payer: Self-pay

## 2021-07-21 VITALS — BP 124/82 | HR 62 | Ht 72.0 in | Wt 262.0 lb

## 2021-07-21 DIAGNOSIS — M25561 Pain in right knee: Secondary | ICD-10-CM

## 2021-07-21 DIAGNOSIS — G8929 Other chronic pain: Secondary | ICD-10-CM | POA: Diagnosis not present

## 2021-07-21 NOTE — Assessment & Plan Note (Signed)
As we have discussed multiple times patient does have some arthritic changes but not severe.  Patient continues to have a recurrent effusion noted.  MRI does show a lateral meniscus tear that is a bucket-handle that I think contributes to the swelling.  We discussed with patient once again that I encouraged him significantly to follow-up with orthopedic surgery to discuss the surgical intervention.  I do think it is highly necessary at this time and cannot continue to do this on a regular basis.  Patient is in agreement with the plan and will be calling tomorrow to make the appointment himself he states.

## 2021-07-21 NOTE — Patient Instructions (Signed)
Drained knee See Dr. Rhona Raider

## 2021-07-31 ENCOUNTER — Other Ambulatory Visit: Payer: Self-pay | Admitting: Internal Medicine

## 2021-08-03 ENCOUNTER — Other Ambulatory Visit: Payer: Self-pay | Admitting: Internal Medicine

## 2021-08-03 NOTE — Telephone Encounter (Signed)
Please refill as per office routine med refill policy (all routine meds to be refilled for 3 mo or monthly (per pt preference) up to one year from last visit, then month to month grace period for 3 mo, then further med refills will have to be denied) ? ?

## 2021-08-04 ENCOUNTER — Telehealth: Payer: Self-pay | Admitting: Internal Medicine

## 2021-08-04 NOTE — Telephone Encounter (Signed)
Left message for patient to call back to schedule Medicare Annual Wellness Visit   Last AWV  08/08/20  Please schedule at anytime with [facility] if patient calls the office back.    40 Minutes appointment   Any questions, please call me at [phone]

## 2021-08-10 ENCOUNTER — Ambulatory Visit (INDEPENDENT_AMBULATORY_CARE_PROVIDER_SITE_OTHER): Payer: Medicare Other

## 2021-08-10 ENCOUNTER — Other Ambulatory Visit: Payer: Self-pay

## 2021-08-10 VITALS — BP 130/70 | HR 72 | Temp 98.7°F | Resp 16 | Ht 72.0 in | Wt 259.0 lb

## 2021-08-10 DIAGNOSIS — Z0000 Encounter for general adult medical examination without abnormal findings: Secondary | ICD-10-CM | POA: Diagnosis not present

## 2021-08-10 NOTE — Patient Instructions (Signed)
[name] , Thank you for taking time to come for your Medicare Wellness Visit. I appreciate your ongoing commitment to your health goals. Please review the following plan we discussed and let me know if I can assist you in the future.   Screening recommendations/referrals: Colonoscopy: 08/22/2014; due every 10 years Recommended yearly ophthalmology/optometry visit for glaucoma screening and checkup Recommended yearly dental visit for hygiene and checkup  Vaccinations: Influenza vaccine: 05/11/2021 Pneumococcal vaccine: 11/27/2015, 10/20/2016 Tdap vaccine: 12/04/2013; due every 10 years Shingles vaccine: never done   Covid-19: 08/03/2019, 08/28/2019  Advanced directives: Advance directive discussed with you today. Even though you declined this today please call our office should you change your mind and we can give you the proper paperwork for you to fill out.  Conditions/risks identified: Yes; Client understands the importance of follow-up appointments with providers by attending scheduled visits and discussed goals to eat healthier, increase physical activity 5 times a week for 30 minutes each, exercise the brain by doing stimulating brain exercises (reading, adult coloring, crafting, listening to music, puzzles, etc.), socialize and enjoy life more, get enough sleep at least 8-9 hours average per night and make time for laughter..sh  Next appointment: Please schedule your next Medicare Wellness Visit with your Nurse Health Advisor in 1 year by calling [phone].  Preventive Care 6 Years and Older, Male Preventive care refers to lifestyle choices and visits with your health care provider that can promote health and wellness. What does preventive care include? A yearly physical exam. This is also called an annual well check. Dental exams once or twice a year. Routine eye exams. Ask your health care provider how often you should have your eyes checked. Personal lifestyle choices,  including: Daily care of your teeth and gums. Regular physical activity. Eating a healthy diet. Avoiding tobacco and drug use. Limiting alcohol use. Practicing safe sex. Taking low doses of aspirin every day. Taking vitamin and mineral supplements as recommended by your health care provider. What happens during an annual well check? The services and screenings done by your health care provider during your annual well check will depend on your age, overall health, lifestyle risk factors, and family history of disease. Counseling  Your health care provider may ask you questions about your: Alcohol use. Tobacco use. Drug use. Emotional well-being. Home and relationship well-being. Sexual activity. Eating habits. History of falls. Memory and ability to understand (cognition). Work and work Statistician. Screening  You may have the following tests or measurements: Height, weight, and BMI. Blood pressure. Lipid and cholesterol levels. These may be checked every 5 years, or more frequently if you are over 55 years old. Skin check. Lung cancer screening. You may have this screening every year starting at age 72 if you have a 30-pack-year history of smoking and currently smoke or have quit within the past 15 years. Fecal occult blood test (FOBT) of the stool. You may have this test every year starting at age 64. Flexible sigmoidoscopy or colonoscopy. You may have a sigmoidoscopy every 5 years or a colonoscopy every 10 years starting at age 60. Prostate cancer screening. Recommendations will vary depending on your family history and other risks. Hepatitis C blood test. Hepatitis B blood test. Sexually transmitted disease (STD) testing. Diabetes screening. This is done by checking your blood sugar (glucose) after you have not eaten for a while (fasting). You may have this done every 1-3 years. Abdominal aortic aneurysm (AAA) screening. You may need this if you are a current  or former  smoker. Osteoporosis. You may be screened starting at age 64 if you are at high risk. Talk with your health care provider about your test results, treatment options, and if necessary, the need for more tests. Vaccines  Your health care provider may recommend certain vaccines, such as: Influenza vaccine. This is recommended every year. Tetanus, diphtheria, and acellular pertussis (Tdap, Td) vaccine. You may need a Td booster every 10 years. Zoster vaccine. You may need this after age 31. Pneumococcal 13-valent conjugate (PCV13) vaccine. One dose is recommended after age 81. Pneumococcal polysaccharide (PPSV23) vaccine. One dose is recommended after age 67. Talk to your health care provider about which screenings and vaccines you need and how often you need them. This information is not intended to replace advice given to you by your health care provider. Make sure you discuss any questions you have with your health care provider. Document Released: 06/20/2015 Document Revised: 02/11/2016 Document Reviewed: 03/25/2015 Elsevier Interactive Patient Education  2017 [place] Prevention in the Home Falls can cause injuries. They can happen to people of all ages. There are many things you can do to make your home safe and to help prevent falls. What can I do on the outside of my home? Regularly fix the edges of walkways and driveways and fix any cracks. Remove anything that might make you trip as you walk through a door, such as a raised step or threshold. Trim any bushes or trees on the path to your home. Use bright outdoor lighting. Clear any walking paths of anything that might make someone trip, such as rocks or tools. Regularly check to see if handrails are loose or broken. Make sure that both sides of any steps have handrails. Any raised decks and porches should have guardrails on the edges. Have any leaves, snow, or ice cleared regularly. Use sand or salt on walking paths during  winter. Clean up any spills in your garage right away. This includes oil or grease spills. What can I do in the bathroom? Use night lights. Install grab bars by the toilet and in the tub and shower. Do not use towel bars as grab bars. Use non-skid mats or decals in the tub or shower. If you need to sit down in the shower, use a plastic, non-slip stool. Keep the floor dry. Clean up any water that spills on the floor as soon as it happens. Remove soap buildup in the tub or shower regularly. Attach bath mats securely with double-sided non-slip rug tape. Do not have throw rugs and other things on the floor that can make you trip. What can I do in the bedroom? Use night lights. Make sure that you have a light by your bed that is easy to reach. Do not use any sheets or blankets that are too big for your bed. They should not hang down onto the floor. Have a firm chair that has side arms. You can use this for support while you get dressed. Do not have throw rugs and other things on the floor that can make you trip. What can I do in the kitchen? Clean up any spills right away. Avoid walking on wet floors. Keep items that you use a lot in easy-to-reach places. If you need to reach something above you, use a strong step stool that has a grab bar. Keep electrical cords out of the way. Do not use floor polish or wax that makes floors slippery. If you must use wax,  use non-skid floor wax. Do not have throw rugs and other things on the floor that can make you trip. What can I do with my stairs? Do not leave any items on the stairs. Make sure that there are handrails on both sides of the stairs and use them. Fix handrails that are broken or loose. Make sure that handrails are as long as the stairways. Check any carpeting to make sure that it is firmly attached to the stairs. Fix any carpet that is loose or worn. Avoid having throw rugs at the top or bottom of the stairs. If you do have throw rugs,  attach them to the floor with carpet tape. Make sure that you have a light switch at the top of the stairs and the bottom of the stairs. If you do not have them, ask someone to add them for you. What else can I do to help prevent falls? Wear shoes that: Do not have high heels. Have rubber bottoms. Are comfortable and fit you well. Are closed at the toe. Do not wear sandals. If you use a stepladder: Make sure that it is fully opened. Do not climb a closed stepladder. Make sure that both sides of the stepladder are locked into place. Ask someone to hold it for you, if possible. Clearly mark and make sure that you can see: Any grab bars or handrails. First and last steps. Where the edge of each step is. Use tools that help you move around (mobility aids) if they are needed. These include: Canes. Walkers. Scooters. Crutches. Turn on the lights when you go into a dark area. Replace any light bulbs as soon as they burn out. Set up your furniture so you have a clear path. Avoid moving your furniture around. If any of your floors are uneven, fix them. If there are any pets around you, be aware of where they are. Review your medicines with your doctor. Some medicines can make you feel dizzy. This can increase your chance of falling. Ask your doctor what other things that you can do to help prevent falls. This information is not intended to replace advice given to you by your health care provider. Make sure you discuss any questions you have with your health care provider. Document Released: 03/20/2009 Document Revised: 10/30/2015 Document Reviewed: 06/28/2014 Elsevier Interactive Patient Education  2017 [employer].

## 2021-08-10 NOTE — Progress Notes (Signed)
Subjective:   [name] is a 71 y.o. male who presents for Medicare Annual/Subsequent preventive examination.  Review of Systems     Cardiac Risk Factors include: advanced age (>31mn, >>28women);dyslipidemia;hypertension;male gender;obesity (BMI >30kg/m2)     Objective:    Today's Vitals   08/10/21 1120 08/10/21 1122  BP: 130/70   Pulse: 72   Resp: 16   Temp: 98.7 F (37.1 C)   SpO2: 97%   Weight: 259 lb (117.5 kg)   Height: 6' (1.829 m)   PainSc: 7  7   PainLoc: Knee    Body mass index is 35.13 kg/m.  Advanced Directives 08/10/2021 09/09/2020 08/08/2020 10/18/2019 10/17/2019 10/12/2019 09/18/2019  Does Patient Have a Medical Advance Directive? No No No No No No No  Does patient want to make changes to medical advance directive? - - - - - - -  Would patient like information on creating a medical advance directive? No - Patient declined No - Patient declined No - Patient declined No - Patient declined - Yes (MAU/Ambulatory/Procedural Areas - Information given) No - Patient declined  Pre-existing out of facility DNR order (yellow form or pink MOST form) - - - - - - -    Current Medications (verified) Outpatient Encounter Medications as of 08/10/2021  Medication Sig   metoprolol tartrate (LOPRESSOR) 50 MG tablet Take 1 tablet by mouth twice daily   acetaminophen (TYLENOL) 500 MG tablet Take 1,000 mg by mouth every 6 (six) hours as needed for moderate pain.    allopurinol (ZYLOPRIM) 100 MG tablet Take 2 tablets (200 mg total) by mouth daily.   amLODipine (NORVASC) 10 MG tablet Take 1 tablet (10 mg total) by mouth daily.   aspirin EC 81 MG tablet Take 81 mg by mouth daily.   atorvastatin (LIPITOR) 40 MG tablet Take 1 tablet (40 mg total) by mouth daily.   cholecalciferol (VITAMIN D3) 25 MCG (1000 UNIT) tablet Take 1,000 Units by mouth daily.   colchicine 0.6 MG tablet Take 1 tablet (0.6 mg total) by mouth daily.   doxycycline (VIBRA-TABS) 100 MG tablet Take 1 tablet (100 mg total)  by mouth 2 (two) times daily.   hydroxypropyl methylcellulose / hypromellose (ISOPTO TEARS / GONIOVISC) 2.5 % ophthalmic solution Place 1-2 drops into both eyes 3 (three) times daily as needed (dry/irritated eyes.).   lactulose (CHRONULAC) 10 GM/15ML solution TAKE 15 TO 30 MLS BY MOUTH ONCE DAILY   meclizine (ANTIVERT) 25 MG tablet Take 1 tablet (25 mg total) by mouth 2 (two) times daily as needed for up to 20 doses for dizziness.   Multiple Vitamin (MULTIVITAMIN WITH MINERALS) TABS tablet Take 1 tablet by mouth daily.   MYRBETRIQ 25 MG TB24 tablet Take 25 mg by mouth daily.   Potassium 99 MG TABS Take 99 mg by mouth daily.    traMADol (ULTRAM) 50 MG tablet Take 1 tablet (50 mg total) by mouth every 6 (six) hours as needed.   No facility-administered encounter medications on file as of 08/10/2021.    Allergies (verified) Patient has no known allergies.   History: Past Medical History:  Diagnosis Date   Anemia    normal Fe, nl B12, nl retic, nl EPO July '13   Aortic regurgitation    Moderate AI 04/17/19 echo   Blood transfusion without reported diagnosis    BPH (benign prostatic hyperplasia)    Chronic back pain    Chronic kidney disease    CKD III, obstructive nephropathy   Diverticulosis  Dysuria    Elevated PSA, greater than or equal to 20 ng/ml June '13   PSA 107   Hemorrhoids, internal, with bleeding, prolapse 09/19/2014   Hyperlipidemia 05/29/2014   Hypertension    Obstructive uropathy 11/27/2015   Pre-diabetes    per patient "reduced sugar intake"   Renal insufficiency    Tuberculosis    h/o PPD +   UTI (urinary tract infection)    Vitamin D deficiency 09/13/2017   Past Surgical History:  Procedure Laterality Date   CARPAL TUNNEL RELEASE Right    COLONOSCOPY     HEMORRHOID BANDING     INSERTION OF MESH N/A 10/17/2019   Procedure: Insertion Of Mesh;  Surgeon: Ralene Ok, MD;  Location: [facility];  Service: General;  Laterality: N/A;   SPLENECTOMY     XI ROBOTIC  ASSISTED SIMPLE PROSTATECTOMY N/A 03/09/2019   Procedure: XI ROBOTIC ASSISTED SIMPLE PROSTATECTOMY;  Surgeon: Cleon Gustin, MD;  Location: [facility];  Service: Urology;  Laterality: N/A;   XI ROBOTIC ASSISTED VENTRAL HERNIA N/A 10/17/2019   Procedure: XI ROBOTIC ASSISTED INCISIONAL HERNIA REPAIR WITH MESH;  Surgeon: Ralene Ok, MD;  Location: [facility];  Service: General;  Laterality: N/A;   Family History  Problem Relation Age of Onset   Diabetes Mother    Stroke Brother    Hearing loss Brother    Colon cancer Neg Hx    Rectal cancer Neg Hx    Stomach cancer Neg Hx    Esophageal cancer Neg Hx    Social History   Socioeconomic History   Marital status: Single    Spouse name: Not on file   Number of children: 2   Years of education: 20   Highest education level: Some college, no degree  Occupational History   Occupation: Lobbyist: [employer]    Comment: unemployed  Tobacco Use   Smoking status: Former    Types: Cigarettes    Quit date: 02/26/1980    Years since quitting: 41.4   Smokeless tobacco: Never  Vaping Use   Vaping Use: Never used  Substance and Sexual Activity   Alcohol use: No    Alcohol/week: 0.0 standard drinks   Drug use: No   Sexual activity: Not Currently  Other Topics Concern   Not on file  Social History Narrative   Native of [place], raised poor farming community. . To [place] '78 - finished College, all but Arts administrator. Married - wife in [place], blocked from [place] to [place]. 1 son '90; 1 dtr '93. 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 Resources depleted.   Right handed.   Caffeine Hot daily one daily.   Social Determinants of Health   Financial Resource Strain: Low Risk    Difficulty of Paying Living Expenses: Not hard at all  Food Insecurity: No Food Insecurity   Worried About Charity fundraiser in the Last Year: Never true   [place] in the Last Year: Never true  Transportation  Needs: No Transportation Needs   Lack of Transportation (Medical): No   Lack of Transportation (Non-Medical): No  Physical Activity: Sufficiently Active   Days of Exercise per Week: 5 days   Minutes of Exercise per Session: 30 min  Stress: No Stress Concern Present   Feeling of Stress : Not at all  Social Connections: Unknown   Frequency of Communication with Friends and Family: More than three times a week  Frequency of Social Gatherings with Friends and Family: Once a week   Attends Religious Services: Never   Marine scientist or Organizations: No   Attends Music therapist: Never   Marital Status: Not on file    Tobacco Counseling Counseling given: Not Answered   Clinical Intake:  Pre-visit preparation completed: Yes  Pain : No/denies pain Pain Score: 7  Pain Type: Chronic pain Pain Location: Knee Pain Orientation: Right, Anterior Pain Descriptors / Indicators: Discomfort, Dull, Aching     BMI - recorded: 35.13 Nutritional Status: BMI > 30  Obese Nutritional Risks: None Diabetes: No  How often do you need to have someone help you when you read instructions, pamphlets, or other written materials from your doctor or pharmacy?: 1 - Never What is the last grade level you completed in school?: Master's Degree in Economics., Master's Degree in Business Administration  Diabetic? no  Interpreter Needed?: No  Information entered by :: Lisette Abu, LPN   Activities of Daily Living In your present state of health, do you have any difficulty performing the following activities: 08/10/2021  Hearing? N  Vision? N  Difficulty concentrating or making decisions? N  Walking or climbing stairs? Y  Dressing or bathing? N  Doing errands, shopping? N  Preparing Food and eating ? N  Using the Toilet? N  In the past six months, have you accidently leaked urine? N  Do you have problems with loss of bowel control? N  Managing your Medications? N  Managing  your Finances? N  Housekeeping or managing your Housekeeping? N  Some recent data might be hidden    Patient Care Team: Biagio Borg, MD as PCP - General (Internal Medicine) Nahser, Wonda Cheng, MD as PCP - Cardiology (Cardiology) Jana Half, DPM as Consulting Physician (Podiatry) Pilar Jarvis Horald Pollen, MD (Inactive) as Consulting Physician (Urology) Knox Royalty, RN as Case Manager  Indicate any recent Medical Services you may have received from other than [facility] providers in the past year (date may be approximate).     Assessment:   This is a routine wellness examination for [name].  Hearing/Vision screen Hearing Screening - Comments:: Patient denied any hearing difficulty.   No hearing aids.  Vision Screening - Comments:: Patient does wear corrective lenses/contacts.   Eye exam done by: [facility]  Dietary issues and exercise activities discussed: Current Exercise Habits: Home exercise routine, Type of exercise: walking, Time (Minutes): 30, Frequency (Times/Week): 5, Weekly Exercise (Minutes/Week): 150, Intensity: Mild, Exercise limited by: orthopedic condition(s)   Goals Addressed             This Visit's Progress    Patient Stated       Continue to stay positive and not let a lot of things side track me.      Depression Screen PHQ 2/9 Scores 08/10/2021 06/12/2021 12/15/2020 12/10/2020 09/09/2020 08/08/2020 08/07/2020  PHQ - 2 Score 0 0 1 1 0 0 0    Fall Risk Fall Risk  08/10/2021 06/15/2021 06/12/2021 03/12/2021 12/10/2020  Falls in the past year? 1 (No Data) 0 1 0  Comment - Denies new falls since last outreach 03/12/21; continues using cane - - -  Number falls in past yr: 0 - 0 0 0  Injury with Fall? 1 - 0 1 0  Comment - - - - -  Risk for fall due to : Orthopedic patient;History of fall(s);Impaired mobility Orthopedic patient;History of fall(s);Impaired mobility - Impaired balance/gait;History of fall(s) -  Risk  for fall due to: Comment - continues using cane prn - - -  Follow up  Falls evaluation completed Falls prevention discussed - Falls prevention discussed -    FALL RISK PREVENTION PERTAINING TO THE HOME:  Any stairs in or around the home? Yes  If so, are there any without handrails? No  Home free of loose throw rugs in walkways, pet beds, electrical cords, etc? Yes  Adequate lighting in your home to reduce risk of falls? Yes   ASSISTIVE DEVICES UTILIZED TO PREVENT FALLS:  Life alert? No  Use of a cane, walker or w/c? Yes  Grab bars in the bathroom? No  Shower chair or bench in shower? No  Elevated toilet seat or a handicapped toilet? No   TIMED UP AND GO:  Was the test performed? Yes .  Length of time to ambulate 10 feet: 8 sec.   Gait steady and fast with assistive device  Cognitive Function: Normal cognitive status assessed by direct observation by this Nurse Health Advisor. No abnormalities found.          Immunizations Immunization History  Administered Date(s) Administered   Fluad Quad(high Dose 65+) 02/21/2019, 05/12/2020, 05/11/2021   Influenza Whole 06/09/1999, 05/18/2007, 03/30/2010   Influenza, High Dose Seasonal PF 04/08/2016, 03/01/2017, 03/24/2018   Influenza,inj,Quad PF,6+ Mos 03/13/2015   Influenza-Unspecified 05/07/2014   PFIZER(Purple Top)SARS-COV-2 Vaccination 08/03/2019, 08/28/2019   Pneumococcal Conjugate-13 11/27/2015   Pneumococcal Polysaccharide-23 10/20/2016   Tdap 12/04/2013    TDAP status: Up to date  Flu Vaccine status: Up to date  Pneumococcal vaccine status: Up to date  Covid-19 vaccine status: Completed vaccines  Qualifies for Shingles Vaccine? Yes   Zostavax completed No   Shingrix Completed?: No.    Education has been provided regarding the importance of this vaccine. Patient has been advised to call insurance company to determine out of pocket expense if they have not yet received this vaccine. Advised may also receive vaccine at local pharmacy or Health Dept. Verbalized acceptance and  understanding.  Screening Tests Health Maintenance  Topic Date Due   COVID-19 Vaccine (3 - Booster for Pfizer series) 10/23/2019   Zoster Vaccines- Shingrix (1 of 2) 09/10/2021 (Originally 06/24/1969)   TETANUS/TDAP  12/05/2023   COLONOSCOPY (Pts 45-11yr Insurance coverage will need to be confirmed)  08/21/2024   Pneumonia Vaccine 71 Years old  Completed   INFLUENZA VACCINE  Completed   Hepatitis C Screening  Completed   HPV VACCINES  Aged Out    Health Maintenance  Health Maintenance Due  Topic Date Due   COVID-19 Vaccine (3 - Booster for [place]) 10/23/2019    Colorectal cancer screening: Type of screening: Colonoscopy. Completed 08/22/2014. Repeat every 10 years  Lung Cancer Screening: (Low Dose CT Chest recommended if Age 71-80years, 30 pack-year currently smoking OR have quit w/in 15years.) does not qualify.   Lung Cancer Screening Referral: no  Additional Screening:  Hepatitis C Screening: does qualify; Completed yes  Vision Screening: Recommended annual ophthalmology exams for early detection of glaucoma and other disorders of the eye. Is the patient up to date with their annual eye exam?  Yes  Who is the provider or what is the name of the office in which the patient attends annual eye exams? [facility] If pt is not established with a provider, would they like to be referred to a provider to establish care? No .   Dental Screening: Recommended annual dental exams for proper oral hygiene  Community Resource Referral /  Chronic Care Management: CRR required this visit?  No   CCM required this visit?  No      Plan:     I have personally reviewed and noted the following in the patients chart:   Medical and social history Use of alcohol, tobacco or illicit drugs  Current medications and supplements including opioid prescriptions. Patient is not currently taking opioid prescriptions. Functional ability and status Nutritional status Physical  activity Advanced directives List of other physicians Hospitalizations, surgeries, and ER visits in previous 12 months Vitals Screenings to include cognitive, depression, and falls Referrals and appointments  In addition, I have reviewed and discussed with patient certain preventive protocols, quality metrics, and best practice recommendations. A written personalized care plan for preventive services as well as general preventive health recommendations were provided to patient.     Sheral Flow, LPN   2/0/9470   Nurse Notes:  Hearing Screening - Comments:: Patient denied any hearing difficulty.   No hearing aids.  Vision Screening - Comments:: Patient does wear corrective lenses/contacts.   Eye exam done by: [facility]

## 2021-08-25 DIAGNOSIS — M109 Gout, unspecified: Secondary | ICD-10-CM | POA: Diagnosis not present

## 2021-08-25 DIAGNOSIS — I129 Hypertensive chronic kidney disease with stage 1 through stage 4 chronic kidney disease, or unspecified chronic kidney disease: Secondary | ICD-10-CM | POA: Diagnosis not present

## 2021-08-25 DIAGNOSIS — N183 Chronic kidney disease, stage 3 unspecified: Secondary | ICD-10-CM | POA: Diagnosis not present

## 2021-08-25 DIAGNOSIS — N390 Urinary tract infection, site not specified: Secondary | ICD-10-CM | POA: Diagnosis not present

## 2021-08-25 DIAGNOSIS — D631 Anemia in chronic kidney disease: Secondary | ICD-10-CM | POA: Diagnosis not present

## 2021-08-25 DIAGNOSIS — N2581 Secondary hyperparathyroidism of renal origin: Secondary | ICD-10-CM | POA: Diagnosis not present

## 2021-08-25 DIAGNOSIS — N189 Chronic kidney disease, unspecified: Secondary | ICD-10-CM | POA: Diagnosis not present

## 2021-09-14 ENCOUNTER — Telehealth: Payer: Self-pay | Admitting: *Deleted

## 2021-09-14 ENCOUNTER — Encounter: Payer: Self-pay | Admitting: *Deleted

## 2021-09-14 ENCOUNTER — Telehealth: Payer: Medicare Other

## 2021-09-14 NOTE — Telephone Encounter (Signed)
?  Chronic Care Management  ? ?Follow Up Note ? ? ?09/14/2021 ?Name: [name] MRN: [id number] DOB: September 24, 1950 ? ?Referred by: Biagio Borg, MD ?Reason for referral : No chief complaint on file. ? ?An unsuccessful telephone outreach was attempted today. The patient was referred to the case management team for assistance with care management and care coordination.  ? ?Follow Up Plan:  ?A HIPPA compliant phone message was left for the patient providing contact information and requesting a return call ?Will place request with scheduling care guide to contact patient to re-schedule today's missed CCM RN follow up telephone appointment if I do not hear back from patient by end of day ? ?[name], RN, BSN, CCRN Alumnus ?[place] ?([phone]: direct office ? ? ?

## 2021-09-17 ENCOUNTER — Other Ambulatory Visit: Payer: Self-pay | Admitting: Internal Medicine

## 2021-10-01 ENCOUNTER — Encounter: Payer: Self-pay | Admitting: Family Medicine

## 2021-10-01 ENCOUNTER — Ambulatory Visit: Payer: Self-pay

## 2021-10-01 ENCOUNTER — Ambulatory Visit (INDEPENDENT_AMBULATORY_CARE_PROVIDER_SITE_OTHER): Payer: Medicare Other | Admitting: Family Medicine

## 2021-10-01 VITALS — BP 130/74 | HR 60 | Ht 72.0 in | Wt 257.0 lb

## 2021-10-01 DIAGNOSIS — M170 Bilateral primary osteoarthritis of knee: Secondary | ICD-10-CM

## 2021-10-01 DIAGNOSIS — M25561 Pain in right knee: Secondary | ICD-10-CM

## 2021-10-01 DIAGNOSIS — G8929 Other chronic pain: Secondary | ICD-10-CM

## 2021-10-01 NOTE — Patient Instructions (Signed)
Drained knee again today ?Best thing is to get replacement ?Speak with Dr. Rhona Raider ?Can do again in 10-12 weeks ?

## 2021-10-01 NOTE — Assessment & Plan Note (Signed)
Chronic problem with exacerbation.  Discussed with patient again that I do feel that surgical intervention would be best.  Patient will still consider it.  I would like to avoid doing this too frequently if possible.  Patient will call us if any worsening pain again, patient knows during recent any worsening significant pain or swelling to seek medical attention immediately. ?

## 2021-10-01 NOTE — Progress Notes (Signed)
?Charlann Boxer D.O. ?[facility] ?[place] ?Phone: [phone] ?Subjective:   ?I, [name], am serving as a scribe for Dr. Hulan Saas. ? ?This visit occurred during the SARS-CoV-2 public health emergency.  Safety protocols were in place, including screening questions prior to the visit, additional usage of staff PPE, and extensive cleaning of exam room while observing appropriate contact time as indicated for disinfecting solutions.  ? ?I'm seeing this patient by the request  of:  Biagio Borg, MD ? ?CC: Right knee pain ? ?[id number]  ?07/21/2021 ?As we have discussed multiple times patient does have some arthritic changes but not severe.  Patient continues to have a recurrent effusion noted.  MRI does show a lateral meniscus tear that is a bucket-handle that I think contributes to the swelling.  We discussed with patient once again that I encouraged him significantly to follow-up with orthopedic surgery to discuss the surgical intervention.  I do think it is highly necessary at this time and cannot continue to do this on a regular basis.  Patient is in agreement with the plan and will be calling tomorrow to make the appointment himself he states. ? ?Update 10/01/2021 ?[name] is a 71 y.o. male coming in with complaint of R knee pain. Patient states that his knee is swollen and painful. Unable to straighten the knee. Has not met with Dr. Rhona Raider yet.  ?States that starting to give him more pain overall, affecting daily activities and waking him up at night ? ? ?  ? ?Past Medical History:  ?Diagnosis Date  ? Anemia   ? normal Fe, nl B12, nl retic, nl EPO July '13  ? Aortic regurgitation   ? Moderate AI 04/17/19 echo  ? Blood transfusion without reported diagnosis   ? BPH (benign prostatic hyperplasia)   ? Chronic back pain   ? Chronic kidney disease   ? CKD III, obstructive nephropathy  ? Diverticulosis   ? Dysuria   ? Elevated PSA, greater than or equal to 20  ng/ml June '13  ? PSA 107  ? Hemorrhoids, internal, with bleeding, prolapse 09/19/2014  ? Hyperlipidemia 05/29/2014  ? Hypertension   ? Obstructive uropathy 11/27/2015  ? Pre-diabetes   ? per patient "reduced sugar intake"  ? Renal insufficiency   ? Tuberculosis   ? h/o PPD +  ? UTI (urinary tract infection)   ? Vitamin D deficiency 09/13/2017  ? ?Past Surgical History:  ?Procedure Laterality Date  ? CARPAL TUNNEL RELEASE Right   ? COLONOSCOPY    ? HEMORRHOID BANDING    ? INSERTION OF MESH N/A 10/17/2019  ? Procedure: Insertion Of Mesh;  Surgeon: Ralene Ok, MD;  Location: [facility];  Service: General;  Laterality: N/A;  ? SPLENECTOMY    ? XI ROBOTIC ASSISTED SIMPLE PROSTATECTOMY N/A 03/09/2019  ? Procedure: XI ROBOTIC ASSISTED SIMPLE PROSTATECTOMY;  Surgeon: Cleon Gustin, MD;  Location[facility];  Service: Urology;  Laterality: N/A;  ? XI ROBOTIC ASSISTED VENTRAL HERNIA N/A 10/17/2019  ? Procedure: XI ROBOTIC ASSISTED INCISIONAL HERNIA REPAIR WITH MESH;  Surgeon: Ralene Ok, MD;  Location: [facility];  Service: General;  Laterality: N/A;  ? ?Social History  ? ?Socioeconomic History  ? Marital status: Single  ?  Spouse name: Not on file  ? Number of children: 2  ? Years of education: 31  ? Highest education level: Some college, no degree  ?Occupational History  ? Occupation: Printmaker:  [employer]  ?  Comment: unemployed  ?Tobacco Use  ? Smoking status: Former  ?  Types: Cigarettes  ?  Quit date: 02/26/1980  ?  Years since quitting: 41.6  ? Smokeless tobacco: Never  ?Vaping Use  ? Vaping Use: Never used  ?Substance and Sexual Activity  ? Alcohol use: No  ?  Alcohol/week: 0.0 standard drinks  ? Drug use: No  ? Sexual activity: Not Currently  ?Other Topics Concern  ? Not on file  ?Social History Narrative  ? Native of [place], raised poor farming community. . To [place] '78 - finished College, all but Arts administrator. Married - wife in [place], blocked from [place] to [place]. 1 son '90; 1 dtr '93.  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 Resources depleted.  ? Right handed.  ? Caffeine Hot daily one daily.  ? ?Social Determinants of Health  ? ?Financial Resource Strain: Low Risk   ? Difficulty of Paying Living Expenses: Not hard at all  ?Food Insecurity: No Food Insecurity  ? Worried About Charity fundraiser in the Last Year: Never true  ? Ran Out of Food in the Last Year: Never true  ?Transportation Needs: No Transportation Needs  ? Lack of Transportation (Medical): No  ? Lack of Transportation (Non-Medical): No  ?Physical Activity: Sufficiently Active  ? Days of Exercise per Week: 5 days  ? Minutes of Exercise per Session: 30 min  ?Stress: No Stress Concern Present  ? Feeling of Stress : Not at all  ?Social Connections: Unknown  ? Frequency of Communication with Friends and Family: More than three times a week  ? Frequency of Social Gatherings with Friends and Family: Once a week  ? Attends Religious Services: Never  ? Active Member of Clubs or Organizations: No  ? Attends Archivist Meetings: Never  ? Marital Status: Not on file  ? ?No Known Allergies ?Family History  ?Problem Relation Age of Onset  ? Diabetes Mother   ? Stroke Brother   ? Hearing loss Brother   ? Colon cancer Neg Hx   ? Rectal cancer Neg Hx   ? Stomach cancer Neg Hx   ? Esophageal cancer Neg Hx   ? ? ? ?Current Outpatient Medications (Cardiovascular):  ?  amLODipine (NORVASC) 10 MG tablet, Take 1 tablet (10 mg total) by mouth daily. ?  atorvastatin (LIPITOR) 40 MG tablet, Take 1 tablet (40 mg total) by mouth daily. ?  metoprolol tartrate (LOPRESSOR) 50 MG tablet, Take 1 tablet by mouth twice daily ? ? ?Current Outpatient Medications (Analgesics):  ?  acetaminophen (TYLENOL) 500 MG tablet, Take 1,000 mg by mouth every 6 (six) hours as needed for moderate pain.  ?  allopurinol (ZYLOPRIM) 100 MG tablet, Take 2 tablets (200 mg total) by mouth daily. ?  aspirin EC 81 MG tablet, Take 81 mg by mouth daily. ?   colchicine 0.6 MG tablet, Take 1 tablet (0.6 mg total) by mouth daily. ?  traMADol (ULTRAM) 50 MG tablet, TAKE 1 TABLET BY MOUTH EVERY 6 HOURS AS NEEDED ? ? ?Current Outpatient Medications (Other):  ?  cholecalciferol (VITAMIN D3) 25 MCG (1000 UNIT) tablet, Take 1,000 Units by mouth daily. ?  doxycycline (VIBRA-TABS) 100 MG tablet, Take 1 tablet (100 mg total) by mouth 2 (two) times daily. ?  hydroxypropyl methylcellulose / hypromellose (ISOPTO TEARS / GONIOVISC) 2.5 % ophthalmic solution, Place 1-2 drops into both eyes 3 (three) times daily as needed (dry/irritated  eyes.). ?  lactulose (CHRONULAC) 10 GM/15ML solution, TAKE 15 TO 30 MLS BY MOUTH ONCE DAILY ?  meclizine (ANTIVERT) 25 MG tablet, Take 1 tablet (25 mg total) by mouth 2 (two) times daily as needed for up to 20 doses for dizziness. ?  Multiple Vitamin (MULTIVITAMIN WITH MINERALS) TABS tablet, Take 1 tablet by mouth daily. ?  MYRBETRIQ 25 MG TB24 tablet, Take 25 mg by mouth daily. ?  Potassium 99 MG TABS, Take 99 mg by mouth daily.  ? ? ?Reviewed prior external information including notes and imaging from  ?primary care provider ?As well as notes that were available from care everywhere and other healthcare systems. ? ?Past medical history, social, surgical and family history all reviewed in electronic medical record.  No pertanent information unless stated regarding to the chief complaint.  ? ?Review of Systems: ? No headache, visual changes, nausea, vomiting, diarrhea, constipation, dizziness, abdominal pain, skin rash, fevers, chills, night sweats, weight loss, swollen lymph nodes, body aches, joint swelling, chest pain, shortness of breath, mood changes. POSITIVE muscle aches ? ?Objective  ?Blood pressure 130/74, pulse 60, height 6' (1.829 m), weight 257 lb (116.6 kg), SpO2 99 %. ?  ?General: No apparent distress alert and oriented x3 mood and affect normal, dressed appropriately.  ?HEENT: Pupils equal, extraocular movements intact  ?Respiratory:  Patient's speak in full sentences and does not appear short of breath  ?Severe antalgic gait walking with the aid of a cane ?Severe arthritic changes, severe effusion noted of the knee.  Limited range of

## 2021-10-20 ENCOUNTER — Encounter: Payer: Self-pay | Admitting: *Deleted

## 2021-10-20 ENCOUNTER — Telehealth: Payer: Medicare Other

## 2021-10-20 ENCOUNTER — Telehealth: Payer: Self-pay | Admitting: *Deleted

## 2021-10-20 NOTE — Telephone Encounter (Signed)
?  Chronic Care Management  ? ?Follow Up Note ? ? ?10/20/2021 ?Name: [name] MRN: [id number] DOB: [birth year] ? ?Referred by: Biagio Borg, MD ?Reason for referral : Chronic Care Management (CCM RN CM Follow Up outreach- second consecutive unsuccessful attempt) ? ?A second unsuccessful telephone outreach was attempted today. The patient was referred to the case management team for assistance with care management and care coordination.  ? ?Follow Up Plan:  ?A HIPPA compliant phone message was left for the patient providing contact information and requesting a return call ?Will place request with scheduling care guide to contact patient to re-schedule today's missed CCM RN follow up telephone appointment if I do not hear back from patient by end of day ? ?[name], RN, BSN, CCRN Alumnus ?[place] ?([phone]: direct office ? ? ?

## 2021-10-23 ENCOUNTER — Telehealth: Payer: Self-pay | Admitting: *Deleted

## 2021-10-23 NOTE — Chronic Care Management (AMB) (Signed)
  Chronic Care Management Note  10/23/2021 Name: [name] MRN: [id number] DOB: 1950/07/31  [name] is a 71 y.o. year old male who is a primary care patient of Biagio Borg, MD and is actively engaged with the care management team. I reached out to Michel Harrow by phone today to assist with re-scheduling a follow up visit with the RN Case Manager  Follow up plan: Unsuccessful telephone outreach attempt made. A HIPAA compliant phone message was left for the patient providing contact information and requesting a return call.  The care management team will reach out to the patient again over the next 7 days.  If patient returns call to provider office, please advise to call [place]  at [phone].  [facility] Management  Direct Dial: [phone]

## 2021-10-30 NOTE — Chronic Care Management (AMB) (Unsigned)
  Chronic Care Management Note  10/30/2021 Name: [name] MRN: [id number] DOB: Nov 12, 1950  [name] is a 71 y.o. year old male who is a primary care patient of Biagio Borg, MD and is actively engaged with the care management team. I reached out to Michel Harrow by phone today to assist with re-scheduling a follow up visit with the RN Case Manager  Follow up plan: Unsuccessful telephone outreach attempt made. A HIPAA compliant phone message was left for the patient providing contact information and requesting a return call.  The care management team will reach out to the patient again over the next 7 days.  If patient returns call to provider office, please advise to call [place] at [phone].  [place] Management  Direct Dial: [phone]

## 2021-11-06 NOTE — Chronic Care Management (AMB) (Signed)
  Chronic Care Management Note  11/06/2021 Name: [name] MRN: [id number] DOB: 02-Jun-1951  [name] is a 71 y.o. year old male who is a primary care patient of Biagio Borg, MD and is actively engaged with the care management team. I reached out to Michel Harrow by phone today to assist with re-scheduling a follow up visit with the RN Case Manager  Follow up plan: Telephone appointment with care management team member scheduled for:12/02/21  [place] Management  Direct Dial: [phone]

## 2021-11-29 ENCOUNTER — Encounter: Payer: Self-pay | Admitting: Cardiovascular Disease

## 2021-11-30 ENCOUNTER — Encounter: Payer: Self-pay | Admitting: Cardiovascular Disease

## 2021-11-30 ENCOUNTER — Ambulatory Visit: Payer: Medicare Other | Admitting: Cardiovascular Disease

## 2021-11-30 VITALS — BP 134/80 | HR 59 | Ht 73.0 in | Wt 250.4 lb

## 2021-11-30 DIAGNOSIS — I4510 Unspecified right bundle-branch block: Secondary | ICD-10-CM | POA: Diagnosis not present

## 2021-11-30 DIAGNOSIS — I5032 Chronic diastolic (congestive) heart failure: Secondary | ICD-10-CM

## 2021-11-30 DIAGNOSIS — I351 Nonrheumatic aortic (valve) insufficiency: Secondary | ICD-10-CM | POA: Diagnosis not present

## 2021-12-02 ENCOUNTER — Ambulatory Visit (INDEPENDENT_AMBULATORY_CARE_PROVIDER_SITE_OTHER): Payer: Medicare Other | Admitting: *Deleted

## 2021-12-02 DIAGNOSIS — N1831 Chronic kidney disease, stage 3a: Secondary | ICD-10-CM

## 2021-12-02 DIAGNOSIS — I10 Essential (primary) hypertension: Secondary | ICD-10-CM

## 2021-12-02 NOTE — Chronic Care Management (AMB) (Signed)
Chronic Care Management   CCM RN Visit Note  12/02/2021 Name: [name] MRN: [id number] DOB: 1951-03-21  Subjective: [name] is a 71 y.o. year old male who is a primary care patient of Donald Borg, MD. The care management team was consulted for assistance with disease management and care coordination needs.    Engaged with patient by telephone for follow up visit/ CCM RN CM case closure in response to provider referral for case management and/or care coordination services.   Consent to Services:  The patient was given information about Chronic Care Management services, agreed to services, and gave verbal consent prior to initiation of services.  Please see initial visit note for detailed documentation.  Patient agreed to services and verbal consent obtained.   Assessment: Review of patient past medical history, allergies, medications, health status, including review of consultants reports, laboratory and other test data, was performed as part of comprehensive evaluation and provision of chronic care management services.   SDOH (Social Determinants of Health) assessments and interventions performed:  SDOH Interventions    Flowsheet Row Most Recent Value  SDOH Interventions   Housing Interventions Intervention Not Indicated  [reports recently moved into new ground level apartment with roommate,  denies housing concerns today]  Transportation Interventions Intervention Not Indicated  [reports continues using public transportation/ bus]     CCM Care Plan  No Known Allergies  Outpatient Encounter Medications as of 12/02/2021  Medication Sig   acetaminophen (TYLENOL) 500 MG tablet Take 1,000 mg by mouth every 6 (six) hours as needed for moderate pain.    allopurinol (ZYLOPRIM) 100 MG tablet Take 2 tablets (200 mg total) by mouth daily.   amLODipine (NORVASC) 10 MG tablet Take 1 tablet (10 mg total) by mouth daily.   aspirin EC 81 MG tablet Take 81 mg by mouth daily.    atorvastatin (LIPITOR) 40 MG tablet Take 1 tablet (40 mg total) by mouth daily.   cholecalciferol (VITAMIN D3) 25 MCG (1000 UNIT) tablet Take 1,000 Units by mouth daily.   colchicine 0.6 MG tablet Take 1 tablet (0.6 mg total) by mouth daily.   hydroxypropyl methylcellulose / hypromellose (ISOPTO TEARS / GONIOVISC) 2.5 % ophthalmic solution Place 1-2 drops into both eyes 3 (three) times daily as needed (dry/irritated eyes.).   lactulose (CHRONULAC) 10 GM/15ML solution TAKE 15 TO 30 MLS BY MOUTH ONCE DAILY   meclizine (ANTIVERT) 25 MG tablet Take 1 tablet (25 mg total) by mouth 2 (two) times daily as needed for up to 20 doses for dizziness.   metoprolol tartrate (LOPRESSOR) 50 MG tablet Take 1 tablet by mouth twice daily   Multiple Vitamin (MULTIVITAMIN WITH MINERALS) TABS tablet Take 1 tablet by mouth daily.   MYRBETRIQ 25 MG TB24 tablet Take 25 mg by mouth daily. (Patient not taking: Reported on 11/30/2021)   Potassium 99 MG TABS Take 99 mg by mouth daily.    traMADol (ULTRAM) 50 MG tablet TAKE 1 TABLET BY MOUTH EVERY 6 HOURS AS NEEDED   No facility-administered encounter medications on file as of 12/02/2021.   Patient Active Problem List   Diagnosis Date Noted   Aortic insufficiency 11/30/2021   Right bundle branch block 11/30/2021   Olecranon bursitis, right elbow 06/13/2021   Rupture of UCL of left thumb 03/18/2021   Aortic atherosclerosis ([place]) 12/10/2020   Chronic pain of right knee 12/10/2020   Bilateral elbow joint pain 07/09/2020   Acute renal failure superimposed on chronic kidney disease ([place]) 11/12/2019  Dry eyes 11/12/2019   S/P hernia repair 10/17/2019   Abdominal pain 07/06/2019   BPH with obstruction/lower urinary tract symptoms 03/09/2019   Clot retention of urine 02/11/2019   Vitamin D deficiency 09/13/2017   Urinary tract infection without hematuria 07/23/2017   Low back pain 07/03/2017   Dizziness 06/30/2017   Acute gouty arthritis 12/01/2016   Degenerative  arthritis of knee, bilateral 11/18/2016   Urinary retention due to benign prostatic hyperplasia 10/24/2016   Mass of right side of neck 10/20/2016   Gout 10/20/2016   Right knee pain 10/12/2016   Hypokalemia 05/28/2016   Encounter for well adult exam with abnormal findings 11/27/2015   Obstructive uropathy 11/27/2015   Elevated PSA 11/27/2015   Acute pyelonephritis 05/27/2015   Generalized bloating 05/27/2015   Chronic constipation 05/27/2015   Chest pain 05/27/2015   AKI (acute kidney injury) ([place]) 05/27/2015   Acute sinus infection 11/22/2014   Cough 09/25/2014   Hemorrhoids, internal, with bleeding, prolapse 09/19/2014   Chronic UTI 05/29/2014   Hyperlipidemia 05/29/2014   Lumbar radiculopathy 05/23/2014   Lumbar stenosis 11/20/2013   Abnormal glucose 11/06/2013   Unspecified hereditary and idiopathic peripheral neuropathy 01/22/2013   Cardiomyopathy due to hypertension ([place]) 12/28/2011   CKD (chronic kidney disease) stage 3, GFR 30-59 ml/min (HCC) 11/24/2011   Hematuria 11/24/2011   Hydronephrosis 11/24/2011   Anemia 11/24/2011   BRADYCARDIA 02/05/2010   TINEA PEDIS 01/29/2010   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   PERIPHERAL EDEMA 01/29/2010   ABNORMAL ELECTROCARDIOGRAM 01/29/2010   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   Osteoarthrosis, generalized, 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   ONYCHOMYCOSIS, TOENAILS 10/02/2007   BPH (benign prostatic hyperplasia) 10/02/2007   Essential hypertension 03/06/2007   Conditions to be addressed/monitored:   HTN and CKD Stage III  Care Plan : RN Care Manager Plan of Care  Updates made by Knox Royalty, RN since 12/02/2021 12:00 AM     Problem: Chronic Disease Management Needs   Priority: Medium     Long-Range Goal: Development of plan of care for long term chronic disease management   Start Date: 03/12/2021  Expected End Date: 03/12/2022  Priority: Medium  Note:   Current Barriers:  Chronic Disease Management support and  education needs related to HTN and CKD Stage III  RNCM Clinical Goal(s):  Patient will demonstrate ongoing health management independence CKD; HTN  through collaboration with RN Care manager, provider, and care team.   Interventions: 1:1 collaboration with primary care provider regarding development and update of comprehensive plan of care as evidenced by provider attestation and co-signature Inter-disciplinary care team collaboration (see longitudinal plan of care) Evaluation of current treatment plan related to  self management and patient's adherence to plan as established by provider Review of patient status, including review of consultants reports, relevant laboratory and other test results, and medications completed SDOH updated: no new/ unmet concerns identified Depression screening updated: no concerns identified Pain assessment updated: ongoing chronic (R) knee pain persists- patient reports well managed at home with prescribed medications, re-positioning; continues to be followed by sports medicine/ orthopedic provider- continues to report that he has been told TKR "is out of the question;"  currently not using assistive devices- used to use cane as needed- but he reports he lost his cane and plans to get another one soon Falls assessment updated: continues to deny new/ recent falls x 10 months- currently not using ;assistive devices; reports "overall steady" in ambulation, was encouraged to obtain new cane soon;  positive reinforcement provided  for no new/ recent falls with encouragement to continue efforts at fall prevention; previously provided education around fall risks/ prevention reinforced Medications discussed: reports continues to independently self-manage and denies current concerns/ issues/ questions around medications; endorses adherence to taking all medications as prescribed Reviewed recent cardiology and renal provider office visits- he verbalizes accurate understanding of  post-visit instructions and denies post- visit questions Reviewed upcoming scheduled provider appointments: 12/11/21- PCP; 12/15/21- sports medicine; 12/18/21- ECHO; patient confirms is aware of all and has plans to attend as scheduled Discussed plans with patient for ongoing care management follow up- patient denies current care coordination/ care management needs and is agreeable to CCM RN CM case closure today; verbalizes understanding to contact PCP or other care providers for any needs that arise in the future, and confirms he has contact information for all care providers     Chronic Kidney Disease (Status: 12/02/21 Goal Met.) Long-Term goal Last practice recorded BP readings:  BP Readings from Last 3 Encounters:  11/30/21 134/80  10/01/21 130/74  08/10/21 130/70  Most recent eGFR/CrCl: No results found for: EGFR  No components found for: CRCL  Evaluation of current treatment plan related to chronic kidney disease self management and patient's adherence to plan as established by provider      Reviewed prescribed diet renal, low salt, heart healthy Discussed the impact of chronic kidney disease on daily life and mental health and acknowledged and normalized feelings of disempowerment, fear, and frustration    Confirmed patient continues to be followed by renal provider; reports Dr. Justin Mend told patient at last office visit that kidney function had improved and was currently stable  Hypertension: (Status: 12/02/21: Goal Met.) Long-term goal Last practice recorded BP readings:  BP Readings from Last 3 Encounters:  11/30/21 134/80  10/01/21 130/74  08/10/21 130/70  Most recent eGFR/CrCl: No results found for: EGFR  No components found for: CRCL  Evaluation of current treatment plan related to hypertension self management and patient's adherence to plan as established by provider;   Discussed plans with patient for ongoing care management follow up and provided patient with direct contact  information for care management team; Discussed complications of poorly controlled blood pressure such as heart disease, stroke, circulatory complications, vision complications, kidney impairment, sexual dysfunction;  Confirmed no concerns/ recent changes to medications: patient endorses adherence and denies concerns/ questions Confirmed patient not currently monitoring blood pressures at home- he reports "all have been good at doctor visits;" we reviewed the most recent blood pressures from provider office visits  Patient Goals/Self-Care Activities: As evidenced by review of EHR, collaboration with care team, and patient reporting during CCM RN CM outreach,  Patient [place] will: Take medications as prescribed Attend all scheduled provider appointments  Call pharmacy for medication refills  Call provider office for new concerns or questions  Continue to stay active and walk for exercise when possible around safe weather conditions Continue to follow heart healthy, low salt, renal diet Continue your regular visits to your Primary Care doctor, your kidney doctor, your sports medicine doctor, and your heart doctor Continue to make every effort to prevent falls and will obtain and use cane as needed    Plan: No further follow up required: patient denies current care coordination/ care management needs and is agreeable to CCM RN CM case closure today; CCM RN CM case closure accordingly  [name], RN, BSN, [place] [phone]: direct office

## 2021-12-02 NOTE — Patient Instructions (Signed)
Visit Information  [name], thank you for taking time to talk with me today.  I have enjoyed working with you and wish you all the best in the future  Below are the goals we discussed today:  Patient Self-Care Activities: Patient [name] will: Take medications as prescribed Attend all scheduled provider appointments  Call pharmacy for medication refills  Call provider office for new concerns or questions  Continue to stay active and walk for exercise when possible around safe weather conditions Continue to follow heart healthy, low salt, renal diet Continue your regular visits to your Primary Care doctor, your kidney doctor, your sports medicine doctor, and your heart doctor Continue to make every effort to prevent falls and will obtain and use cane as needed   [name], RN, BSN, [place] [phone]: direct office  If you are experiencing a Mental Health or [place] or need someone to talk to, please  call the Suicide and Crisis Lifeline: 988 call the [place] National Suicide Prevention Lifeline: 857-255-7362 or TTY: 236-410-7453 TTY 860-778-4809) to talk to a trained counselor call 1-800-273-TALK (toll free, 24 hour hotline) go to [facility] [address], [place] [phone]) call 911   The patient verbalized understanding of instructions, educational materials, and care plan provided today and agreed to receive a mailed copy of patient instructions, educational materials, and care plan  Echocardiogram An echocardiogram is a test that uses sound waves (ultrasound) to produce images of the heart. Images from an echocardiogram can provide important information about: Heart size and shape. The size and thickness and movement of your heart's walls. Heart muscle function and strength. Heart valve function or if you have stenosis. Stenosis is when the heart  valves are too narrow. If blood is flowing backward through the heart valves (regurgitation). A tumor or infectious growth around the heart valves. Areas of heart muscle that are not working well because of poor blood flow or injury from a heart attack. Aneurysm detection. An aneurysm is a weak or damaged part of an artery wall. The wall bulges out from the normal force of blood pumping through the body. Tell a health care provider about: Any allergies you have. All medicines you are taking, including vitamins, herbs, eye drops, creams, and over-the-counter medicines. Any blood disorders you have. Any surgeries you have had. Any medical conditions you have. Whether you are pregnant or may be pregnant. What are the risks? Generally, this is a safe test. However, problems may occur, including an allergic reaction to dye (contrast) that may be used during the test. What happens before the test? No specific preparation is needed. You may eat and drink normally. What happens during the test?  You will take off your clothes from the waist up and put on a hospital gown. Electrodes or electrocardiogram (ECG)patches may be placed on your chest. The electrodes or patches are then connected to a device that monitors your heart rate and rhythm. You will lie down on a table for an ultrasound exam. A gel will be applied to your chest to help sound waves pass through your skin. A handheld device, called a transducer, will be pressed against your chest and moved over your heart. The transducer produces sound waves that travel to your heart and bounce back (or "echo" back) to the transducer. These sound waves will be captured in real-time and changed into images of your heart that can be  viewed on a video monitor. The images will be recorded on a computer and reviewed by your health care provider. You may be asked to change positions or hold your breath for a short time. This makes it easier to get different  views or better views of your heart. In some cases, you may receive contrast through an IV in one of your veins. This can improve the quality of the pictures from your heart. The procedure may vary among health care providers and hospitals. What can I expect after the test? You may return to your normal, everyday life, including diet, activities, and medicines, unless your health care provider tells you not to do that. Follow these instructions at home: It is up to you to get the results of your test. Ask your health care provider, or the department that is doing the test, when your results will be ready. Keep all follow-up visits. This is important. Summary An echocardiogram is a test that uses sound waves (ultrasound) to produce images of the heart. Images from an echocardiogram can provide important information about the size and shape of your heart, heart muscle function, heart valve function, and other possible heart problems. You do not need to do anything to prepare before this test. You may eat and drink normally. After the echocardiogram is completed, you may return to your normal, everyday life, unless your health care provider tells you not to do that. This information is not intended to replace advice given to you by your health care provider. Make sure you discuss any questions you have with your health care provider. Document Revised: 02/04/2021 Document Reviewed: 01/15/2020 Elsevier Patient Education  [place].

## 2021-12-04 DIAGNOSIS — I129 Hypertensive chronic kidney disease with stage 1 through stage 4 chronic kidney disease, or unspecified chronic kidney disease: Secondary | ICD-10-CM

## 2021-12-04 DIAGNOSIS — N1831 Chronic kidney disease, stage 3a: Secondary | ICD-10-CM

## 2021-12-04 NOTE — Progress Notes (Deleted)
[place] Phone: [phone] Subjective:    I'm seeing this patient by the request  of:  Biagio Borg, MD  CC:   [id number]  10/01/2021 Chronic problem with exacerbation.  Discussed with patient again that I do feel that surgical intervention would be best.  Patient will still consider it.  I would like to avoid doing this too frequently if possible.  Patient will call us if any worsening pain again, patient knows during recent any worsening significant pain or swelling to seek medical attention immediately.  Updated 12/15/2021 [name] is a 71 y.o. male coming in with complaint of bilateral knee pain       Past Medical History:  Diagnosis Date   Anemia    normal Fe, nl B12, nl retic, nl EPO July '13   Aortic regurgitation    Moderate AI 04/17/19 echo   Blood transfusion without reported diagnosis    BPH (benign prostatic hyperplasia)    Chronic back pain    Chronic kidney disease    CKD III, obstructive nephropathy   Diverticulosis    Dysuria    Elevated PSA, greater than or equal to 20 ng/ml June '13   PSA 107   Hemorrhoids, internal, with bleeding, prolapse 09/19/2014   Hyperlipidemia 05/29/2014   Hypertension    Obstructive uropathy 11/27/2015   Pre-diabetes    per patient "reduced sugar intake"   Renal insufficiency    Tuberculosis    h/o PPD +   UTI (urinary tract infection)    Vitamin D deficiency 09/13/2017   Past Surgical History:  Procedure Laterality Date   CARPAL TUNNEL RELEASE Right    COLONOSCOPY     HEMORRHOID BANDING     INSERTION OF MESH N/A 10/17/2019   Procedure: Insertion Of Mesh;  Surgeon: Ralene Ok, MD;  Location: [facility];  Service: General;  Laterality: N/A;   SPLENECTOMY     XI ROBOTIC ASSISTED SIMPLE PROSTATECTOMY N/A 03/09/2019   Procedure: XI ROBOTIC ASSISTED SIMPLE PROSTATECTOMY;  Surgeon: Cleon Gustin, MD;  Location: [facility];  Service: Urology;   Laterality: N/A;   XI ROBOTIC ASSISTED VENTRAL HERNIA N/A 10/17/2019   Procedure: XI ROBOTIC ASSISTED INCISIONAL HERNIA REPAIR WITH MESH;  Surgeon: Ralene Ok, MD;  Location: [facility];  Service: General;  Laterality: N/A;   Social History   Socioeconomic History   Marital status: Single    Spouse name: Not on file   Number of children: 2   Years of education: 20   Highest education level: Some college, no degree  Occupational History   Occupation: Lobbyist: [employer]    Comment: unemployed  Tobacco Use   Smoking status: Former    Types: Cigarettes    Quit date: 02/26/1980    Years since quitting: 41.8   Smokeless tobacco: Never  Vaping Use   Vaping Use: Never used  Substance and Sexual Activity   Alcohol use: No    Alcohol/week: 0.0 standard drinks of alcohol   Drug use: No   Sexual activity: Not Currently  Other Topics Concern   Not on file  Social History Narrative   Native of [place], raised poor farming community. . To [place] '78 - finished College, all but Arts administrator. Married - wife in [place], blocked from [place] to [place]. 1 son '90; 1 dtr '93. 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 Resources depleted.  Right handed.   Caffeine Hot daily one daily.   Social Determinants of Health   Financial Resource Strain: Low Risk  (08/10/2021)   Overall Financial Resource Strain (CARDIA)    Difficulty of Paying Living Expenses: Not hard at all  Food Insecurity: No Food Insecurity (08/10/2021)   Hunger Vital Sign    Worried About Running Out of Food in the Last Year: Never true    Ran Out of Food in the Last Year: Never true  Transportation Needs: No Transportation Needs (12/02/2021)   PRAPARE - Hydrologist (Medical): No    Lack of Transportation (Non-Medical): No  Physical Activity: Sufficiently Active (08/10/2021)   Exercise Vital Sign    Days of Exercise per Week: 5 days    Minutes of  Exercise per Session: 30 min  Stress: No Stress Concern Present (08/10/2021)   [place]    Feeling of Stress : Not at all  Social Connections: Unknown (08/10/2021)   Social Connection and Isolation Panel [NHANES]    Frequency of Communication with Friends and Family: More than three times a week    Frequency of Social Gatherings with Friends and Family: Once a week    Attends Religious Services: Never    Marine scientist or Organizations: No    Attends Music therapist: Never    Marital Status: Not on file   No Known Allergies Family History  Problem Relation Age of Onset   Diabetes Mother    Stroke Brother    Hearing loss Brother    Colon cancer Neg Hx    Rectal cancer Neg Hx    Stomach cancer Neg Hx    Esophageal cancer Neg Hx      Current Outpatient Medications (Cardiovascular):    amLODipine (NORVASC) 10 MG tablet, Take 1 tablet (10 mg total) by mouth daily.   atorvastatin (LIPITOR) 40 MG tablet, Take 1 tablet (40 mg total) by mouth daily.   metoprolol tartrate (LOPRESSOR) 50 MG tablet, Take 1 tablet by mouth twice daily   Current Outpatient Medications (Analgesics):    acetaminophen (TYLENOL) 500 MG tablet, Take 1,000 mg by mouth every 6 (six) hours as needed for moderate pain.    allopurinol (ZYLOPRIM) 100 MG tablet, Take 2 tablets (200 mg total) by mouth daily.   aspirin EC 81 MG tablet, Take 81 mg by mouth daily.   colchicine 0.6 MG tablet, Take 1 tablet (0.6 mg total) by mouth daily.   traMADol (ULTRAM) 50 MG tablet, TAKE 1 TABLET BY MOUTH EVERY 6 HOURS AS NEEDED   Current Outpatient Medications (Other):    cholecalciferol (VITAMIN D3) 25 MCG (1000 UNIT) tablet, Take 1,000 Units by mouth daily.   hydroxypropyl methylcellulose / hypromellose (ISOPTO TEARS / GONIOVISC) 2.5 % ophthalmic solution, Place 1-2 drops into both eyes 3 (three) times daily as needed (dry/irritated eyes.).    lactulose (CHRONULAC) 10 GM/15ML solution, TAKE 15 TO 30 MLS BY MOUTH ONCE DAILY   meclizine (ANTIVERT) 25 MG tablet, Take 1 tablet (25 mg total) by mouth 2 (two) times daily as needed for up to 20 doses for dizziness.   Multiple Vitamin (MULTIVITAMIN WITH MINERALS) TABS tablet, Take 1 tablet by mouth daily.   MYRBETRIQ 25 MG TB24 tablet, Take 25 mg by mouth daily. (Patient not taking: Reported on 11/30/2021)   Potassium 99 MG TABS, Take 99 mg by mouth daily.    Reviewed prior external information  including notes and imaging from  primary care provider As well as notes that were available from care everywhere and other healthcare systems.  Past medical history, social, surgical and family history all reviewed in electronic medical record.  No pertanent information unless stated regarding to the chief complaint.   Review of Systems:  No headache, visual changes, nausea, vomiting, diarrhea, constipation, dizziness, abdominal pain, skin rash, fevers, chills, night sweats, weight loss, swollen lymph nodes, body aches, joint swelling, chest pain, shortness of breath, mood changes. POSITIVE muscle aches  Objective  There were no vitals taken for this visit.   General: No apparent distress alert and oriented x3 mood and affect normal, dressed appropriately.  HEENT: Pupils equal, extraocular movements intact  Respiratory: Patient's speak in full sentences and does not appear short of breath  Cardiovascular: No lower extremity edema, non tender, no erythema      Impression and Recommendations:

## 2021-12-08 ENCOUNTER — Emergency Department (HOSPITAL_COMMUNITY): Payer: Medicare Other

## 2021-12-08 ENCOUNTER — Encounter (HOSPITAL_COMMUNITY): Payer: Self-pay

## 2021-12-08 ENCOUNTER — Inpatient Hospital Stay (HOSPITAL_COMMUNITY)
Admission: EM | Admit: 2021-12-08 | Discharge: 2021-12-12 | DRG: 065 | Disposition: A | Discharge status: Skilled Nursing Facility | Payer: Medicare Other | Attending: Family Medicine | Admitting: Family Medicine

## 2021-12-08 DIAGNOSIS — Z1152 Encounter for screening for COVID-19: Secondary | ICD-10-CM

## 2021-12-08 DIAGNOSIS — M1A9XX0 Chronic gout, unspecified, without tophus (tophi): Secondary | ICD-10-CM | POA: Diagnosis present

## 2021-12-08 DIAGNOSIS — N1832 Chronic kidney disease, stage 3b: Secondary | ICD-10-CM | POA: Diagnosis present

## 2021-12-08 DIAGNOSIS — I4510 Unspecified right bundle-branch block: Secondary | ICD-10-CM | POA: Diagnosis not present

## 2021-12-08 DIAGNOSIS — M25561 Pain in right knee: Secondary | ICD-10-CM | POA: Diagnosis not present

## 2021-12-08 DIAGNOSIS — N401 Enlarged prostate with lower urinary tract symptoms: Secondary | ICD-10-CM | POA: Diagnosis present

## 2021-12-08 DIAGNOSIS — M109 Gout, unspecified: Secondary | ICD-10-CM | POA: Diagnosis not present

## 2021-12-08 DIAGNOSIS — M160 Bilateral primary osteoarthritis of hip: Secondary | ICD-10-CM | POA: Diagnosis not present

## 2021-12-08 DIAGNOSIS — R0989 Other specified symptoms and signs involving the circulatory and respiratory systems: Secondary | ICD-10-CM | POA: Diagnosis present

## 2021-12-08 DIAGNOSIS — I63412 Cerebral infarction due to embolism of left middle cerebral artery: Principal | ICD-10-CM | POA: Diagnosis present

## 2021-12-08 DIAGNOSIS — I10 Essential (primary) hypertension: Secondary | ICD-10-CM | POA: Diagnosis not present

## 2021-12-08 DIAGNOSIS — I130 Hypertensive heart and chronic kidney disease with heart failure and stage 1 through stage 4 chronic kidney disease, or unspecified chronic kidney disease: Secondary | ICD-10-CM | POA: Diagnosis not present

## 2021-12-08 DIAGNOSIS — G8929 Other chronic pain: Secondary | ICD-10-CM | POA: Diagnosis present

## 2021-12-08 DIAGNOSIS — E785 Hyperlipidemia, unspecified: Secondary | ICD-10-CM | POA: Diagnosis not present

## 2021-12-08 DIAGNOSIS — I77810 Thoracic aortic ectasia: Secondary | ICD-10-CM | POA: Diagnosis not present

## 2021-12-08 DIAGNOSIS — N3281 Overactive bladder: Secondary | ICD-10-CM | POA: Diagnosis present

## 2021-12-08 DIAGNOSIS — E669 Obesity, unspecified: Secondary | ICD-10-CM | POA: Diagnosis present

## 2021-12-08 DIAGNOSIS — I351 Nonrheumatic aortic (valve) insufficiency: Secondary | ICD-10-CM | POA: Diagnosis present

## 2021-12-08 DIAGNOSIS — I700 Atherosclerosis of aorta: Secondary | ICD-10-CM | POA: Diagnosis present

## 2021-12-08 DIAGNOSIS — E7800 Pure hypercholesterolemia, unspecified: Secondary | ICD-10-CM | POA: Diagnosis not present

## 2021-12-08 DIAGNOSIS — N1831 Chronic kidney disease, stage 3a: Secondary | ICD-10-CM | POA: Diagnosis present

## 2021-12-08 DIAGNOSIS — Z7401 Bed confinement status: Secondary | ICD-10-CM | POA: Diagnosis not present

## 2021-12-08 DIAGNOSIS — I639 Cerebral infarction, unspecified: Secondary | ICD-10-CM | POA: Diagnosis present

## 2021-12-08 DIAGNOSIS — I5032 Chronic diastolic (congestive) heart failure: Secondary | ICD-10-CM | POA: Diagnosis not present

## 2021-12-08 DIAGNOSIS — E876 Hypokalemia: Secondary | ICD-10-CM | POA: Diagnosis not present

## 2021-12-08 DIAGNOSIS — R17 Unspecified jaundice: Secondary | ICD-10-CM | POA: Diagnosis not present

## 2021-12-08 DIAGNOSIS — R2990 Unspecified symptoms and signs involving the nervous system: Secondary | ICD-10-CM | POA: Diagnosis present

## 2021-12-08 DIAGNOSIS — N189 Chronic kidney disease, unspecified: Secondary | ICD-10-CM | POA: Diagnosis not present

## 2021-12-08 DIAGNOSIS — R4702 Dysphasia: Secondary | ICD-10-CM | POA: Diagnosis not present

## 2021-12-08 DIAGNOSIS — J9811 Atelectasis: Secondary | ICD-10-CM | POA: Diagnosis not present

## 2021-12-08 DIAGNOSIS — Z833 Family history of diabetes mellitus: Secondary | ICD-10-CM

## 2021-12-08 DIAGNOSIS — R410 Disorientation, unspecified: Secondary | ICD-10-CM | POA: Diagnosis not present

## 2021-12-08 DIAGNOSIS — R29707 NIHSS score 7: Secondary | ICD-10-CM | POA: Diagnosis present

## 2021-12-08 DIAGNOSIS — Z7982 Long term (current) use of aspirin: Secondary | ICD-10-CM

## 2021-12-08 DIAGNOSIS — G8191 Hemiplegia, unspecified affecting right dominant side: Secondary | ICD-10-CM | POA: Diagnosis present

## 2021-12-08 DIAGNOSIS — I6389 Other cerebral infarction: Secondary | ICD-10-CM | POA: Diagnosis not present

## 2021-12-08 DIAGNOSIS — Z823 Family history of stroke: Secondary | ICD-10-CM

## 2021-12-08 DIAGNOSIS — M6281 Muscle weakness (generalized): Secondary | ICD-10-CM | POA: Diagnosis not present

## 2021-12-08 DIAGNOSIS — N138 Other obstructive and reflux uropathy: Secondary | ICD-10-CM | POA: Diagnosis present

## 2021-12-08 DIAGNOSIS — Z6834 Body mass index (BMI) 34.0-34.9, adult: Secondary | ICD-10-CM

## 2021-12-08 DIAGNOSIS — R001 Bradycardia, unspecified: Secondary | ICD-10-CM | POA: Diagnosis not present

## 2021-12-08 DIAGNOSIS — R4701 Aphasia: Secondary | ICD-10-CM | POA: Diagnosis not present

## 2021-12-08 DIAGNOSIS — Z87891 Personal history of nicotine dependence: Secondary | ICD-10-CM

## 2021-12-08 DIAGNOSIS — R2689 Other abnormalities of gait and mobility: Secondary | ICD-10-CM | POA: Diagnosis not present

## 2021-12-08 DIAGNOSIS — R269 Unspecified abnormalities of gait and mobility: Secondary | ICD-10-CM | POA: Diagnosis not present

## 2021-12-08 DIAGNOSIS — Z9081 Acquired absence of spleen: Secondary | ICD-10-CM

## 2021-12-08 DIAGNOSIS — I119 Hypertensive heart disease without heart failure: Secondary | ICD-10-CM | POA: Diagnosis not present

## 2021-12-08 DIAGNOSIS — R1312 Dysphagia, oropharyngeal phase: Secondary | ICD-10-CM | POA: Diagnosis not present

## 2021-12-08 DIAGNOSIS — R498 Other voice and resonance disorders: Secondary | ICD-10-CM | POA: Diagnosis not present

## 2021-12-08 DIAGNOSIS — Z79899 Other long term (current) drug therapy: Secondary | ICD-10-CM

## 2021-12-08 DIAGNOSIS — R531 Weakness: Secondary | ICD-10-CM | POA: Diagnosis not present

## 2021-12-08 DIAGNOSIS — R262 Difficulty in walking, not elsewhere classified: Secondary | ICD-10-CM | POA: Diagnosis not present

## 2021-12-08 DIAGNOSIS — R6889 Other general symptoms and signs: Secondary | ICD-10-CM | POA: Diagnosis not present

## 2021-12-08 LAB — CBC
HCT: 45.5 % (ref 39.0–52.0)
Hemoglobin: 15.4 g/dL (ref 13.0–17.0)
MCH: 29.7 pg (ref 26.0–34.0)
MCHC: 33.8 g/dL (ref 30.0–36.0)
MCV: 87.7 fL (ref 80.0–100.0)
Platelets: 282 10*3/uL (ref 150–400)
RBC: 5.19 MIL/uL (ref 4.22–5.81)
RDW: 13.7 % (ref 11.5–15.5)
WBC: 6.4 10*3/uL (ref 4.0–10.5)
nRBC: 0 % (ref 0.0–0.2)

## 2021-12-08 LAB — RAPID URINE DRUG SCREEN, HOSP PERFORMED
Amphetamines: NOT DETECTED
Barbiturates: NOT DETECTED
Benzodiazepines: NOT DETECTED
Cocaine: NOT DETECTED
Opiates: NOT DETECTED
Tetrahydrocannabinol: NOT DETECTED

## 2021-12-08 LAB — ETHANOL: Alcohol, Ethyl (B): <10 mg/dL (ref <10)

## 2021-12-08 LAB — APTT: aPTT: 35 seconds (ref 24–36)

## 2021-12-08 LAB — DIFFERENTIAL
Abs Immature Granulocytes: 0.01 10*3/uL (ref 0.00–0.07)
Basophils Absolute: 0 10*3/uL (ref 0.0–0.1)
Basophils Relative: 1 %
Eosinophils Absolute: 0.1 10*3/uL (ref 0.0–0.5)
Eosinophils Relative: 2 %
Immature Granulocytes: 0 %
Lymphocytes Relative: 60 %
Lymphs Abs: 3.8 10*3/uL (ref 0.7–4.0)
Monocytes Absolute: 0.6 10*3/uL (ref 0.1–1.0)
Monocytes Relative: 9 %
Neutro Abs: 1.8 10*3/uL (ref 1.7–7.7)
Neutrophils Relative %: 28 %

## 2021-12-08 LAB — RESP PANEL BY RT-PCR (FLU A&B, COVID) ARPGX2
Influenza A by PCR: NEGATIVE
Influenza B by PCR: NEGATIVE
SARS Coronavirus 2 by RT PCR: NEGATIVE

## 2021-12-08 LAB — COMPREHENSIVE METABOLIC PANEL
ALT: 15 U/L (ref 0–44)
AST: 19 U/L (ref 15–41)
Albumin: 4.1 g/dL (ref 3.5–5.0)
Alkaline Phosphatase: 100 U/L (ref 38–126)
Anion gap: 9 (ref 5–15)
BUN: 20 mg/dL (ref 8–23)
CO2: 29 mmol/L (ref 22–32)
Calcium: 9.2 mg/dL (ref 8.9–10.3)
Chloride: 103 mmol/L (ref 98–111)
Creatinine, Ser: 1.74 mg/dL — ABNORMAL HIGH (ref 0.61–1.24)
GFR, Estimated: 41 mL/min — ABNORMAL LOW (ref >60)
Glucose, Bld: 98 mg/dL (ref 70–99)
Potassium: 3 mmol/L — ABNORMAL LOW (ref 3.5–5.1)
Sodium: 141 mmol/L (ref 135–145)
Total Bilirubin: 1 mg/dL (ref 0.3–1.2)
Total Protein: 7.8 g/dL (ref 6.5–8.1)

## 2021-12-08 LAB — I-STAT CHEM 8, ED
BUN: 18 mg/dL (ref 8–23)
Calcium, Ion: 1.16 mmol/L (ref 1.15–1.40)
Chloride: 101 mmol/L (ref 98–111)
Creatinine, Ser: 2.3 mg/dL — ABNORMAL HIGH (ref 0.61–1.24)
Glucose, Bld: 93 mg/dL (ref 70–99)
HCT: 46 % (ref 39.0–52.0)
Hemoglobin: 15.6 g/dL (ref 13.0–17.0)
Potassium: 3.1 mmol/L — ABNORMAL LOW (ref 3.5–5.1)
Sodium: 143 mmol/L (ref 135–145)
TCO2: 30 mmol/L (ref 22–32)

## 2021-12-08 LAB — URINALYSIS, ROUTINE W REFLEX MICROSCOPIC
Bacteria, UA: NONE SEEN
Bilirubin Urine: NEGATIVE
Glucose, UA: NEGATIVE mg/dL
Ketones, ur: NEGATIVE mg/dL
Leukocytes,Ua: NEGATIVE
Nitrite: NEGATIVE
Protein, ur: NEGATIVE mg/dL
Specific Gravity, Urine: 1.005 (ref 1.005–1.030)
pH: 7 (ref 5.0–8.0)

## 2021-12-08 LAB — PROTIME-INR
INR: 1 (ref 0.8–1.2)
Prothrombin Time: 13.2 seconds (ref 11.4–15.2)

## 2021-12-08 LAB — AMMONIA: Ammonia: <10 umol/L (ref 9–35)

## 2021-12-08 LAB — CBG MONITORING, ED: Glucose-Capillary: 83 mg/dL (ref 70–99)

## 2021-12-08 MED ORDER — OXYCODONE HCL 5 MG PO TABS
5.0000 mg | ORAL_TABLET | Freq: Four times a day (QID) | ORAL | Status: DC | PRN
  Administered 2021-12-11 – 2021-12-12 (×2): 5 mg via ORAL
  Filled 2021-12-08 (×2): qty 1

## 2021-12-08 MED ORDER — LACTATED RINGERS IV SOLN: INTRAVENOUS | Status: DC

## 2021-12-08 MED ORDER — HYDROMORPHONE HCL 1 MG/ML IJ SOLN: 0.5000 mg | INTRAMUSCULAR | Status: DC | PRN

## 2021-12-08 MED ORDER — ATORVASTATIN CALCIUM 80 MG PO TABS
80.0000 mg | ORAL_TABLET | Freq: Every day | ORAL | Status: DC
  Administered 2021-12-08 – 2021-12-12 (×5): 80 mg via ORAL
  Filled 2021-12-08 (×3): qty 1
  Filled 2021-12-08: qty 2
  Filled 2021-12-08 (×2): qty 1

## 2021-12-08 MED ORDER — ASPIRIN 81 MG PO TBEC
81.0000 mg | DELAYED_RELEASE_TABLET | Freq: Every day | ORAL | Status: DC
Start: 2021-12-08 — End: 2021-12-12
  Administered 2021-12-08 – 2021-12-12 (×5): 81 mg via ORAL
  Filled 2021-12-08 (×5): qty 1

## 2021-12-08 MED ORDER — ACETAMINOPHEN 325 MG PO TABS: 650.0000 mg | ORAL_TABLET | Freq: Four times a day (QID) | ORAL | Status: DC | PRN

## 2021-12-08 MED ORDER — POTASSIUM CHLORIDE 10 MEQ/100ML IV SOLN
10.0000 meq | INTRAVENOUS | Status: AC
  Administered 2021-12-08 (×2): 10 meq via INTRAVENOUS
  Filled 2021-12-08 (×2): qty 100

## 2021-12-08 MED ORDER — ENOXAPARIN SODIUM 40 MG/0.4ML IJ SOSY
40.0000 mg | PREFILLED_SYRINGE | INTRAMUSCULAR | Status: DC
  Administered 2021-12-08 – 2021-12-11 (×4): 40 mg via SUBCUTANEOUS
  Filled 2021-12-08 (×4): qty 0.4

## 2021-12-08 MED ORDER — POLYETHYLENE GLYCOL 3350 17 G PO PACK
17.0000 g | PACK | Freq: Every day | ORAL | Status: DC | PRN
  Administered 2021-12-10: 17 g via ORAL
  Filled 2021-12-08: qty 1

## 2021-12-08 MED ORDER — MIRABEGRON ER 25 MG PO TB24: 25.0000 mg | ORAL_TABLET | Freq: Every day | ORAL | Status: DC

## 2021-12-08 MED ORDER — PROCHLORPERAZINE EDISYLATE 10 MG/2ML IJ SOLN
10.0000 mg | Freq: Four times a day (QID) | INTRAMUSCULAR | Status: DC | PRN
  Administered 2021-12-12: 10 mg via INTRAVENOUS
  Filled 2021-12-08: qty 2

## 2021-12-08 MED ORDER — CLOPIDOGREL BISULFATE 75 MG PO TABS
75.0000 mg | ORAL_TABLET | Freq: Every day | ORAL | Status: DC
  Administered 2021-12-08 – 2021-12-12 (×5): 75 mg via ORAL
  Filled 2021-12-08 (×5): qty 1

## 2021-12-08 MED ORDER — LABETALOL HCL 5 MG/ML IV SOLN
5.0000 mg | INTRAVENOUS | Status: DC | PRN
Start: 2021-12-08 — End: 2021-12-12

## 2021-12-08 MED ORDER — MELATONIN 5 MG PO TABS: 5.0000 mg | ORAL_TABLET | Freq: Every evening | ORAL | Status: DC | PRN

## 2021-12-08 NOTE — ED Provider Notes (Signed)
Emergency Department Provider Note   I have reviewed the triage vital signs and the nursing notes.   HISTORY  Chief Complaint No chief complaint on file.   HPI [name] is a 71 y.o. male with PMH of HTN, HLD, and CKD presents to the ED with confusion. He arrives by private vehicle brought in by his roommate. He last saw him yesterday at around 7 PM (unsure of exact time) and he was normal. He did not see him this AM but returned from work today to find him confused and weak. Patient tells me that "I feel unwell" but denies specific pain. Roommate denies any EtOH or drug use history. No known falls.   Level 5 caveat: AMS   Past Medical History:  Diagnosis Date   Anemia    normal Fe, nl B12, nl retic, nl EPO July '13   Aortic regurgitation    Moderate AI 04/17/19 echo   Blood transfusion without reported diagnosis    BPH (benign prostatic hyperplasia)    Chronic back pain    Chronic kidney disease    CKD III, obstructive nephropathy   Diverticulosis    Dysuria    Elevated PSA, greater than or equal to 20 ng/ml June '13   PSA 107   Hemorrhoids, internal, with bleeding, prolapse 09/19/2014   Hyperlipidemia 05/29/2014   Hypertension    Obstructive uropathy 11/27/2015   Pre-diabetes    per patient "reduced sugar intake"   Renal insufficiency    Tuberculosis    h/o PPD +   UTI (urinary tract infection)    Vitamin D deficiency 09/13/2017    Review of Systems  Constitutional: No fever/chills Cardiovascular: Denies chest pain. Respiratory: Denies shortness of breath. Gastrointestinal: No abdominal pain.  No nausea, no vomiting Musculoskeletal: Negative for back pain. Skin: Negative for rash. Neurological: Negative for headaches.  ____________________________________________   PHYSICAL EXAM:  VITAL SIGNS: ED Triage Vitals  Enc Vitals Group     BP 12/08/21 1841 (!) 172/102     Pulse Rate 12/08/21 1842 (!) 57     Resp 12/08/21 1841 18     Temp 12/08/21 1841  98.1 F (36.7 C)     Temp Source 12/08/21 1841 Oral     SpO2 12/08/21 1841 97 %    Constitutional: Patient arrives appearing somewhat encephalopathic.  He is trouble providing a clear history.  Eyes: Conjunctivae are normal. PERRL. Head: Atraumatic. Nose: No congestion/rhinnorhea. Mouth/Throat: Mucous membranes are moist.  Neck: No stridor. Cardiovascular: Bradycardia. Good peripheral circulation. Grossly normal heart sounds.   Respiratory: Normal respiratory effort.  No retractions. Lungs CTAB. Gastrointestinal: Soft and nontender. No distention.  Musculoskeletal: No lower extremity tenderness nor edema. No gross deformities of extremities. Neurologic: Positive dysarthria and slightly repetitive speech noted. Question of right side weakness but able to resist gravity in the upper and lower extremities bilaterally. Grade weakness 4/5. No gaze deficits or obvious visual field abnormality.  Skin:  Skin is warm, dry and intact. No rash noted.   ____________________________________________   LABS (all labs ordered are listed, but only abnormal results are displayed)  Labs Reviewed  COMPREHENSIVE METABOLIC PANEL - Abnormal; Notable for the following components:      Result Value   Potassium 3.0 (*)    Creatinine, Ser 1.74 (*)    GFR, Estimated 41 (*)    All other components within normal limits  URINALYSIS, ROUTINE W REFLEX MICROSCOPIC - Abnormal; Notable for the following components:   Color, Urine STRAW (*)  Hgb urine dipstick SMALL (*)    All other components within normal limits  I-STAT CHEM 8, ED - Abnormal; Notable for the following components:   Potassium 3.1 (*)    Creatinine, Ser 2.30 (*)    All other components within normal limits  RESP PANEL BY RT-PCR (FLU A&B, COVID) ARPGX2  ETHANOL  PROTIME-INR  APTT  CBC  DIFFERENTIAL  RAPID URINE DRUG SCREEN, HOSP PERFORMED  AMMONIA  LIPID PANEL  HEMOGLOBIN A1C  CBC WITH DIFFERENTIAL/PLATELET  COMPREHENSIVE METABOLIC  PANEL  MAGNESIUM  PHOSPHORUS  CBG MONITORING, ED   ____________________________________________  EKG   EKG Interpretation  Date/Time:  Tuesday December 08 2021 18:54:01 EDT Ventricular Rate:  49 PR Interval:  247 QRS Duration: 180 QT Interval:  516 QTC Calculation: 466 R Axis:   -46 Text Interpretation: Sinus bradycardia 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 RADIATION DOSE REDUCTION: This exam was performed according to the departmental dose-optimization program which includes automated exposure control, adjustment of the mA and/or kV according to patient size and/or use of iterative reconstruction technique. COMPARISON:  CT head 05/22/2020 FINDINGS: Brain: There is no acute intracranial hemorrhage, extra-axial fluid collection, or acute infarct. Parenchymal volume is within normal limits for age. The ventricles are normal in size. Patchy and confluent hypodensity throughout the subcortical and periventricular white matter likely reflects sequela of chronic white matter microangiopathy. There is no mass lesion.  There is no mass effect or midline shift. Vascular: The intracranial ICAs and posterior circulation are ectatic with the left supraclinoid ICA measuring up to 7 mm in the basilar artery measuring up to 7 mm, similar to the prior study. No dense vessel is seen. Skull: Normal. Negative for fracture or focal lesion. Sinuses/Orbits: The imaged paranasal sinuses are clear. The imaged globes and orbits are unremarkable. Other: None. IMPRESSION: 1. No acute intracranial pathology. Unchanged  chronic white matter microangiopathy. 2. 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 Electronically Signed   By: Valetta Mole M.D.   On: 12/08/2021 19:33    ____________________________________________   PROCEDURES  Procedure(s) performed:   .Critical Care  Performed by: Margette Fast, MD Authorized by: Margette Fast, MD   Critical care provider statement:    Critical care time (minutes):  35   Critical care time was exclusive of:  Separately billable procedures and treating other patients and teaching time   Critical care was necessary to treat or prevent imminent or life-threatening deterioration of the following conditions:  CNS failure or compromise   Critical care was time spent personally by me on the following activities:  Development of treatment plan with patient or surrogate, discussions with consultants, evaluation of patient's response to treatment, examination of patient, ordering and review of laboratory studies, ordering and review of radiographic studies, ordering and performing treatments and interventions, pulse oximetry, re-evaluation of patient's condition, review of old charts and obtaining history from patient or surrogate   I assumed direction of critical care for this patient from another provider in my specialty: no     Care discussed with: admitting provider     ____________________________________________   INITIAL IMPRESSION /  ASSESSMENT AND PLAN / ED COURSE  Pertinent labs & imaging results that were available during my care of the patient were reviewed by me and considered in my medical decision making (see chart for details).   This patient is Presenting for Evaluation of AMS, which does require a range of treatment options, and is a complaint that involves a high risk of morbidity and mortality.  The Differential Diagnoses includes but is not exclusive to alcohol, illicit or prescription medications, intracranial pathology such  as stroke, intracerebral hemorrhage, fever or infectious causes including sepsis, hypoxemia, uremia, trauma, endocrine related disorders such as diabetes, hypoglycemia, thyroid-related diseases, etc.   Critical Interventions-    Medications  enoxaparin (LOVENOX) injection 40 mg (has no administration in time range)  labetalol (NORMODYNE) injection 5 mg (has no administration in time range)  acetaminophen (TYLENOL) tablet 650 mg (has no administration in time range)  polyethylene glycol (MIRALAX / GLYCOLAX) packet 17 g (has no administration in time range)  melatonin tablet 5 mg (has no administration in time range)  prochlorperazine (COMPAZINE) injection 10 mg (has no administration in time range)  lactated ringers infusion (has no administration in time range)  potassium chloride 10 mEq in 100 mL IVPB (10 mEq Intravenous New Bag/Given 12/08/21 2212)  aspirin EC tablet 81 mg (has no administration in time range)  clopidogrel (PLAVIX) tablet 75 mg (has no administration in time range)  atorvastatin (LIPITOR) tablet 80 mg (has no administration in time range)  mirabegron ER (MYRBETRIQ) tablet 25 mg (has no administration in time range)  oxyCODONE (Oxy IR/ROXICODONE) immediate release tablet 5 mg (has no administration in time range)  HYDROmorphone (DILAUDID) injection 0.5 mg (has no administration in time range)    Reassessment after intervention: Symptoms unchanged.    I did obtain Additional Historical Information from roommate at bedside.  I decided to review pertinent External Data, and in summary patient with visits with sports medicine and PCP. Recently has seen Cardiology   Clinical Laboratory Tests Ordered, included no evidence of urinary tract infection.  Creatinine of 1.74 similar to prior.  Ammonia normal.  COVID and flu were negative.  Radiologic Tests Ordered, included CT head. I independently interpreted the images and agree with radiology interpretation.   Cardiac Monitor  Tracing which shows NSR.   Social Determinants of Health Risk denies EtOH or drugs.   Consult complete with  Neurology, Dr. Leonel Ramsay. Patient can be admitted to [facility] and they will consult.   Hospitalist, Dr. Nevada Crane. Plan for admit.   Medical Decision Making: Summary:  Patient arrives to the emergency department by private vehicle brought in by his roommate who estimates last normal at 7 PM but difficult to get an exact last seen normal from the roommate.  On my exam the patient seems more encephalopathic.  I do question some right-sided weakness but exam is not entirely consistent.  Patient is well outside of the TNK window.  I do not have clear findings on exam to have strong suspicion for LVO.  Patient's creatinine of 2.3 on i-STAT and unclear exam will plan for non-contrast head CT to start.   Reevaluation with update and discussion with patient and son now at bedside. Unable to get a more accurate LSN. Patient remains weak on the right arm and leg. Some dysarthria and slightly repetitive speech as well.   Disposition: admit  ____________________________________________  FINAL CLINICAL IMPRESSION(S) / ED DIAGNOSES  Final diagnoses:  Stroke-like symptoms    Note:  This document was  prepared using Systems analyst and may include unintentional dictation errors.  Nanda Quinton, MD, [facility] Emergency Medicine    Gabrial Domine, [facility], MD 12/08/21 2251

## 2021-12-08 NOTE — H&P (Signed)
History and Physical  [name] XNA:[id number] DOB: [birth year] DOA: 12/08/2021  Referring physician: Dr. Laverta Baltimore, [place]  PCP: Biagio Borg, MD  Outpatient Specialists: Cardiology, sports medicine. Patient coming from: Home.  Chief Complaint: Right-sided weakness, dysphasia.  HPI: [name] is a 71 y.o. male originally from [place] with medical history significant for essential hypertension, hyperlipidemia, chronic diastolic CHF, moderate aortic insufficiency, BPH, vertigo, overactive bladder, gout, history of positive PPD, chronic right knee pain, uses a cane at baseline, who presented to [facility] ED from home due to right-sided weakness, and dysphasia.  Last known well was around 7 PM by his roommate whom he lives with.  His roommate left this morning for work, had not seen the patient.  When he returned home this afternoon, he noted the patient had right-sided weakness and his words did not make sense.  At baseline, the patient is alert and oriented x3.  Independent of all his ADLs.  No history of dementia.  The patient was brought into the ED for further evaluation.  CT head was nonacute.  Due to concern for stroke with persistent symptoms, EDP contacted neurology/stroke team who recommended admission to [facility] for further stroke work-up.  The patient was admitted by [facility], hospitalist service.  ED Course: Tmax 98.1.  BP 172/102, pulse 67, respiratory 12, O2 saturation 97% on room air.  Lab studies significant for serum potassium 3.1.  Creatinine 2.30 from GFR of 1.7.  CBC with differential was essentially unremarkable.  Review of Systems: Review of systems as noted in the HPI. All other systems reviewed and are negative.   Past Medical History:  Diagnosis Date   Anemia    normal Fe, nl B12, nl retic, nl EPO July '13   Aortic regurgitation    Moderate AI 04/17/19 echo   Blood transfusion without reported diagnosis    BPH (benign prostatic hyperplasia)    Chronic back pain     Chronic kidney disease    CKD III, obstructive nephropathy   Diverticulosis    Dysuria    Elevated PSA, greater than or equal to 20 ng/ml June '13   PSA 107   Hemorrhoids, internal, with bleeding, prolapse 09/19/2014   Hyperlipidemia 05/29/2014   Hypertension    Obstructive uropathy 11/27/2015   Pre-diabetes    per patient "reduced sugar intake"   Renal insufficiency    Tuberculosis    h/o PPD +   UTI (urinary tract infection)    Vitamin D deficiency 09/13/2017   Past Surgical History:  Procedure Laterality Date   CARPAL TUNNEL RELEASE Right    COLONOSCOPY     HEMORRHOID BANDING     INSERTION OF MESH N/A 10/17/2019   Procedure: Insertion Of Mesh;  Surgeon: Ralene Ok, MD;  Location: [place];  Service: General;  Laterality: N/A;   SPLENECTOMY     XI ROBOTIC ASSISTED SIMPLE PROSTATECTOMY N/A 03/09/2019   Procedure: XI ROBOTIC ASSISTED SIMPLE PROSTATECTOMY;  Surgeon: Cleon Gustin, MD;  Location: [facility];  Service: Urology;  Laterality: N/A;   XI ROBOTIC ASSISTED VENTRAL HERNIA N/A 10/17/2019   Procedure: XI ROBOTIC ASSISTED INCISIONAL HERNIA REPAIR WITH MESH;  Surgeon: Ralene Ok, MD;  Location: [place];  Service: General;  Laterality: N/A;    Social History:  reports that he quit smoking about 41 years ago. His smoking use included cigarettes. He has never used smokeless tobacco. He reports that he does not drink alcohol and does not use drugs.   No Known Allergies  Family History  Problem Relation Age of Onset   Diabetes Mother    Stroke Brother    Hearing loss Brother    Colon cancer Neg Hx    Rectal cancer Neg Hx    Stomach cancer Neg Hx    Esophageal cancer Neg Hx       Prior to Admission medications   Medication Sig Start Date End Date Taking? Authorizing Provider  acetaminophen (TYLENOL) 500 MG tablet Take 1,000 mg by mouth every 6 (six) hours as needed for moderate pain.     [provider]  allopurinol (ZYLOPRIM) 100 MG tablet Take 2 tablets  (200 mg total) by mouth daily. 12/10/20   Biagio Borg, MD  amLODipine (NORVASC) 10 MG tablet Take 1 tablet (10 mg total) by mouth daily. 12/10/20   Biagio Borg, MD  aspirin EC 81 MG tablet Take 81 mg by mouth daily.    [provider]  atorvastatin (LIPITOR) 40 MG tablet Take 1 tablet (40 mg total) by mouth daily. 12/10/20   Biagio Borg, MD  cholecalciferol (VITAMIN D3) 25 MCG (1000 UNIT) tablet Take 1,000 Units by mouth daily.    [provider]  colchicine 0.6 MG tablet Take 1 tablet (0.6 mg total) by mouth daily. 12/10/20   Biagio Borg, MD  hydroxypropyl methylcellulose / hypromellose (ISOPTO TEARS / GONIOVISC) 2.5 % ophthalmic solution Place 1-2 drops into both eyes 3 (three) times daily as needed (dry/irritated eyes.). 11/12/19   Biagio Borg, MD  lactulose (CHRONULAC) 10 GM/15ML solution TAKE 15 TO 30 MLS BY MOUTH ONCE DAILY 07/31/21   Gatha Mayer, MD  meclizine (ANTIVERT) 25 MG tablet Take 1 tablet (25 mg total) by mouth 2 (two) times daily as needed for up to 20 doses for dizziness. 05/22/20   Lennice Sites, DO  metoprolol tartrate (LOPRESSOR) 50 MG tablet Take 1 tablet by mouth twice daily 08/04/21   Biagio Borg, MD  Multiple Vitamin (MULTIVITAMIN WITH MINERALS) TABS tablet Take 1 tablet by mouth daily.    [provider]  MYRBETRIQ 25 MG TB24 tablet Take 25 mg by mouth daily. Patient not taking: Reported on 11/30/2021 03/03/20   [provider]  Potassium 99 MG TABS Take 99 mg by mouth daily.     [provider]  traMADol (ULTRAM) 50 MG tablet TAKE 1 TABLET BY MOUTH EVERY 6 HOURS AS NEEDED 09/17/21   Biagio Borg, MD    Physical Exam: BP (!) 149/92   Pulse 65   Temp 98.1 F (36.7 C) (Oral)   Resp 17   SpO2 95%   General: 71 y.o. year-old male well developed well nourished in no acute distress.  Alert and interactive.  Dysphasia noted. Cardiovascular: Regular rate and rhythm with no rubs or gallops.  No thyromegaly or JVD noted.  No  lower extremity edema. 2/4 pulses in all 4 extremities. Respiratory: Clear to auscultation with no wheezes or rales. Good inspiratory effort. Abdomen: Soft nontender nondistended with normal bowel sounds x4 quadrants. Muskuloskeletal: No cyanosis, clubbing or edema noted bilaterally Neuro: CN II-XII intact, sensation, reflexes.  3 out of 5 strength right upper and lower extremities. Skin: No ulcerative lesions noted or rashes Psychiatry: Judgement and insight appear normal. Mood is appropriate for condition and setting          Labs on Admission:  Basic Metabolic Panel: Recent Labs  Lab 12/08/21 1841 12/08/21 1845  NA 141 143  K 3.0* 3.1*  CL  103 101  CO2 29  --   GLUCOSE 98 93  BUN 20 18  CREATININE 1.74* 2.30*  CALCIUM 9.2  --    Liver Function Tests: Recent Labs  Lab 12/08/21 1841  AST 19  ALT 15  ALKPHOS 100  BILITOT 1.0  PROT 7.8  ALBUMIN 4.1   No results for input(s): "LIPASE", "AMYLASE" in the last 168 hours. Recent Labs  Lab 12/08/21 1844  AMMONIA <10   CBC: Recent Labs  Lab 12/08/21 1841 12/08/21 1845  WBC 6.4  --   NEUTROABS 1.8  --   HGB 15.4 15.6  HCT 45.5 46.0  MCV 87.7  --   PLT 282  --    Cardiac Enzymes: No results for input(s): "CKTOTAL", "CKMB", "CKMBINDEX", "TROPONINI" in the last 168 hours.  BNP (last 3 results) No results for input(s): "BNP" in the last 8760 hours.  ProBNP (last 3 results) No results for input(s): "PROBNP" in the last 8760 hours.  CBG: Recent Labs  Lab 12/08/21 1833  GLUCAP 83    Radiological Exams on Admission: CT Head Wo Contrast  Result Date: 12/08/2021 CLINICAL DATA:  Altered mental status, confusion EXAM: CT HEAD WITHOUT CONTRAST TECHNIQUE: Contiguous axial images were obtained from the base of the skull through the vertex without intravenous contrast. RADIATION DOSE REDUCTION: This exam was performed according to the departmental dose-optimization program which includes automated exposure control,  adjustment of the mA and/or kV according to patient size and/or use of iterative reconstruction technique. COMPARISON:  CT head 05/22/2020 FINDINGS: Brain: There is no acute intracranial hemorrhage, extra-axial fluid collection, or acute infarct. Parenchymal volume is within normal limits for age. The ventricles are normal in size. Patchy and confluent hypodensity throughout the subcortical and periventricular white matter likely reflects sequela of chronic white matter microangiopathy. There is no mass lesion.  There is no mass effect or midline shift. Vascular: The intracranial ICAs and posterior circulation are ectatic with the left supraclinoid ICA measuring up to 7 mm in the basilar artery measuring up to 7 mm, similar to the prior study. No dense vessel is seen. Skull: Normal. Negative for fracture or focal lesion. Sinuses/Orbits: The imaged paranasal sinuses are clear. The imaged globes and orbits are unremarkable. Other: None. IMPRESSION: 1. No acute intracranial pathology. Unchanged chronic white matter microangiopathy. 2. 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 Electronically Signed   By: Valetta Mole M.D.   On: 12/08/2021 19:33    EKG: I independently viewed the EKG done and my findings are as followed: Sinus pericardia rate of 49.  Nonspecific ST-T changes.  QTc 466.  Assessment/Plan Present on Admission:  Suspected cerebrovascular accident (CVA)  Principal Problem:   Suspected cerebrovascular accident (CVA)  Suspected CVA Last known well 7 PM on 12/24/2021. Right-sided weakness, dysphasia, symptoms persist in the ED. Admitted for stroke work-up in the setting of CKD. MRI brain MRA head and neck without contrast Bilateral carotid Doppler ultrasound 2D echo with bubble study Fasting lipid panel, hemoglobin A1c Goal LDL less than 70, goal A1c less than 7.0 Aspirin 81 mg daily, Plavix 75 mg daily, Lipitor 80 mg daily PT/OT/speech therapist  evaluation Neurochecks every 4 hours Aspiration/fall precautions Telemetry monitoring Permissive hypertension, treat SBP greater than 220 or DBP greater than 120. Contact neurology/stroke team once the patient [facility] at [facility]  Essential hypertension, BP is elevated. Permissive hypertension in place Hold off home oral antihypertensives. Treat SBP greater than 220 or DBP greater than 120 Closely monitor vital  signs  Chronic diastolic CHF/moderate aortic insufficiency Start strict I's and O, daily weight Follow 2D echo with bubble study  Hyperlipidemia Increase home dose of Lipitor to 80 mg daily. Goal LDL less than 70.  Gout No acute issues  Overactive bladder Resume home Myrbetriq  Chronic right knee pain Follows with sports medicine outpatient. Resume home regimen once med rec has been reconciled Analgesics as needed  Ambulatory dysfunction Uses a cane at baseline Fall precautions    Critical care time: 65 minutes.    DVT prophylaxis: Subcu Lovenox daily  Code Status: Full code  Family Communication: Updated his son Bronson Ing [phone] via Phone.  He was made aware that the patient is being transferred to [facility] for further stroke work-up.  Disposition Plan: Admitted to telemetry medical unit at [place] called: Neurology/stroke team consulted by EDP  Admission status: Inpatient status.   Status is: Inpatient The patient requires 2 midnights for further evaluation and treatment of present condition.   Kayleen Memos MD [facility] Pager [phone]  If 7PM-7AM, please contact night-coverage [email or web address] Password [facility]  12/08/2021, 9:37 PM

## 2021-12-08 NOTE — ED Notes (Signed)
Aware of need for urine sample, urinal at bedside 

## 2021-12-08 NOTE — ED Notes (Signed)
EDP at bedside  

## 2021-12-08 NOTE — ED Triage Notes (Signed)
Pt arrived via POV, c/o AMS. Confusion, only able to answer simple questions. Right sided weakness. Last known well approx 730pm last night, per roommate. Pt roommate found pt at home just PTA confused and said something was not right.   CBG 83

## 2021-12-08 NOTE — ED Notes (Signed)
Teleneuro device at bedside

## 2021-12-09 ENCOUNTER — Inpatient Hospital Stay (HOSPITAL_COMMUNITY): Payer: Medicare Other

## 2021-12-09 ENCOUNTER — Encounter (HOSPITAL_COMMUNITY): Payer: Medicare Other

## 2021-12-09 DIAGNOSIS — I6389 Other cerebral infarction: Secondary | ICD-10-CM | POA: Diagnosis not present

## 2021-12-09 DIAGNOSIS — R0989 Other specified symptoms and signs involving the circulatory and respiratory systems: Secondary | ICD-10-CM | POA: Diagnosis not present

## 2021-12-09 LAB — LIPID PANEL
Cholesterol: 123 mg/dL (ref 0–200)
HDL: 48 mg/dL (ref >40)
LDL Cholesterol: 68 mg/dL (ref 0–99)
Total CHOL/HDL Ratio: 2.6 RATIO
Triglycerides: 36 mg/dL (ref <150)
VLDL: 7 mg/dL (ref 0–40)

## 2021-12-09 LAB — COMPREHENSIVE METABOLIC PANEL
ALT: 15 U/L (ref 0–44)
AST: 19 U/L (ref 15–41)
Albumin: 3.5 g/dL (ref 3.5–5.0)
Alkaline Phosphatase: 90 U/L (ref 38–126)
Anion gap: 13 (ref 5–15)
BUN: 15 mg/dL (ref 8–23)
CO2: 27 mmol/L (ref 22–32)
Calcium: 9.2 mg/dL (ref 8.9–10.3)
Chloride: 104 mmol/L (ref 98–111)
Creatinine, Ser: 1.94 mg/dL — ABNORMAL HIGH (ref 0.61–1.24)
GFR, Estimated: 36 mL/min — ABNORMAL LOW (ref >60)
Glucose, Bld: 82 mg/dL (ref 70–99)
Potassium: 3 mmol/L — ABNORMAL LOW (ref 3.5–5.1)
Sodium: 144 mmol/L (ref 135–145)
Total Bilirubin: 1.3 mg/dL — ABNORMAL HIGH (ref 0.3–1.2)
Total Protein: 7.2 g/dL (ref 6.5–8.1)

## 2021-12-09 LAB — CBC WITH DIFFERENTIAL/PLATELET
Abs Immature Granulocytes: 0 10*3/uL (ref 0.00–0.07)
Basophils Absolute: 0.1 10*3/uL (ref 0.0–0.1)
Basophils Relative: 1 %
Eosinophils Absolute: 0.1 10*3/uL (ref 0.0–0.5)
Eosinophils Relative: 1 %
HCT: 47 % (ref 39.0–52.0)
Hemoglobin: 15.6 g/dL (ref 13.0–17.0)
Immature Granulocytes: 0 %
Lymphocytes Relative: 46 %
Lymphs Abs: 2.7 10*3/uL (ref 0.7–4.0)
MCH: 28.7 pg (ref 26.0–34.0)
MCHC: 33.2 g/dL (ref 30.0–36.0)
MCV: 86.4 fL (ref 80.0–100.0)
Monocytes Absolute: 0.5 10*3/uL (ref 0.1–1.0)
Monocytes Relative: 9 %
Neutro Abs: 2.5 10*3/uL (ref 1.7–7.7)
Neutrophils Relative %: 43 %
Platelets: 278 10*3/uL (ref 150–400)
RBC: 5.44 MIL/uL (ref 4.22–5.81)
RDW: 13.6 % (ref 11.5–15.5)
WBC: 5.9 10*3/uL (ref 4.0–10.5)
nRBC: 0 % (ref 0.0–0.2)

## 2021-12-09 LAB — ECHOCARDIOGRAM COMPLETE BUBBLE STUDY
AR max vel: 3.87 cm2
AV Peak grad: 8.6 mmHg
Ao pk vel: 1.47 m/s
Area-P 1/2: 7.82 cm2
Calc EF: 57.8 %
P 1/2 time: 452 msec
S' Lateral: 3.8 cm
Single Plane A2C EF: 57.7 %
Single Plane A4C EF: 58.1 %

## 2021-12-09 LAB — PHOSPHORUS: Phosphorus: 2.5 mg/dL (ref 2.5–4.6)

## 2021-12-09 LAB — HEMOGLOBIN A1C
Hgb A1c MFr Bld: 5.6 % (ref 4.8–5.6)
Mean Plasma Glucose: 114.02 mg/dL

## 2021-12-09 LAB — MAGNESIUM: Magnesium: 1.8 mg/dL (ref 1.7–2.4)

## 2021-12-09 MED ORDER — POTASSIUM CHLORIDE CRYS ER 20 MEQ PO TBCR
40.0000 meq | EXTENDED_RELEASE_TABLET | Freq: Once | ORAL | Status: AC
  Administered 2021-12-09: 40 meq via ORAL
  Filled 2021-12-09: qty 2

## 2021-12-09 NOTE — Progress Notes (Signed)
Inpatient Rehab Admissions Coordinator Note:   Per therapy recommendations patient was screened for CIR candidacy by Michel Santee, PT. At this time, pt appears to be a potential candidate for CIR. I will place an order for rehab consult for full assessment, per our protocol.  Please contact me any with questions.Shann Medal, PT, DPT [phone] 12/09/21 10:51 AM

## 2021-12-09 NOTE — Evaluation (Signed)
Occupational Therapy Evaluation Patient Details Name: [name] MRN: [id number] DOB: 1950/12/08 Today's Date: 12/09/2021   History of Present Illness Pt is a 71 y/o male admitted secondary to rigth sided weakness and difficulty speaking. 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 PMH including but not limited to hypertension, hyperlipidemia, CKD.   Clinical Impression   Pt is a 71 yo male with unknown prior level of function. Per chart he lives with a roommate and anticipate some level of indep with ADLs and mobility at baseline. Ability to determine baseline limited by pt's communication deficits and confusion as well as no family present at bedside. Pt needing multimodal cues to participate in assessment with concerns for deficits in cognition, communication, and RUE coordination/strength. He is able to sit EOB and perform toileting task with use of urinal with min A. He perseverates over the same words and phrases attempting to say "it's not right." Pt was able to communicate need for toileting successfully. Pt left supine with bed alarm activated. Will continue to follow acutely.      Recommendations for follow up therapy are one component of a multi-disciplinary discharge planning process, led by the attending physician.  Recommendations may be updated based on patient status, additional functional criteria and insurance authorization.   Follow Up Recommendations  Acute inpatient rehab (3hours/day)    Assistance Recommended at Discharge Frequent or constant Supervision/Assistance  Patient can return home with the following A little help with walking and/or transfers;A little help with bathing/dressing/bathroom;Direct supervision/assist for medications management;Assist for transportation;Direct supervision/assist for financial management    Functional Status Assessment  Patient has had a recent decline in their functional status and demonstrates the ability to make  significant improvements in function in a reasonable and predictable amount of time.  Equipment Recommendations  Other (comment) (to be determined by rehab facility)    Recommendations for Other Services PT consult;Speech consult;Rehab consult     Precautions / Restrictions Precautions Precautions: Fall Precaution Comments: R weakness Restrictions Weight Bearing Restrictions: No      Mobility Bed Mobility Overal bed mobility: Needs Assistance Bed Mobility: Supine to Sit     Supine to sit: Min assist, HOB elevated (tactile cues needed to initiate due to difficulty following commands)          Transfers Overall transfer level: Needs assistance Equipment used: 1 person hand held assist Transfers: Sit to/from Stand Sit to Stand: Min assist, From elevated surface                  Balance Overall balance assessment: Needs assistance Sitting-balance support: Feet supported Sitting balance-Leahy Scale: Fair     Standing balance support: During functional activity, Single extremity supported Standing balance-Leahy Scale: Poor                             ADL either performed or assessed with clinical judgement   ADL Overall ADL's : Needs assistance/impaired Eating/Feeding: Minimal assistance   Grooming: Sitting;Minimal assistance;Wash/dry face   Upper Body Bathing: Minimal assistance   Lower Body Bathing: Moderate assistance   Upper Body Dressing : Minimal assistance   Lower Body Dressing: Moderate assistance;Sit to/from stand   Toilet Transfer: Minimal assistance;BSC/3in1   Toileting- Water quality scientist and Hygiene: Min guard;Sitting/lateral lean               Vision Baseline Vision/History: 1 Wears glasses Patient Visual Report: Other (comment) (able to demonstrate smooth tracking however  unable to participate in more formal visual assessment due to inconsistent command following) Vision Assessment?: Vision impaired- to be further  tested in functional context     Perception Perception Comments: informally note decreased awarness and coordination in [place] Comments: offered pen and paper but pt did not know how to make a mark, likely impacted  by confusion but also unable to hold urinal with R hand or manage clothing with R hand    Pertinent Vitals/Pain Pain Assessment Pain Assessment: No/denies pain     Hand Dominance Right   Extremity/Trunk Assessment Upper Extremity Assessment Upper Extremity Assessment: RUE deficits/detail RUE Deficits / Details: decreased attention to R extremity, initially grasping OT hand, difficulty following commands for full motor assessment, progressed to opening and closing hand with demo and verbal cues as well as reaching overhead with RUE, coordination deficits worsen with mobility attempts RUE Sensation: decreased proprioception RUE Coordination: decreased fine motor;decreased gross motor   Lower Extremity Assessment Lower Extremity Assessment: Defer to PT evaluation       Communication Communication Communication: Expressive difficulties   Cognition Arousal/Alertness: Awake/alert Behavior During Therapy: Agitated (frequent perseverations while attempting to verbally communicate) Overall Cognitive Status: Impaired/Different from baseline Area of Impairment: Following commands, Safety/judgement, Awareness, Problem solving                       Following Commands: Follows one step commands inconsistently Safety/Judgement: Decreased awareness of deficits, Decreased awareness of safety Awareness: Intellectual Problem Solving: Slow processing, Decreased initiation, Difficulty sequencing, Requires verbal cues, Requires tactile cues       General Comments  LE edema, thin skin            Home Living Family/patient expects to be discharged to:: Unsure           Additional Comments: pt with poor cognition and unable to answer any  questions regarding home set-up or PLOF with no family/caregivers present throughout  Lives With: Other (Comment) (per chart lives with roommate)    Prior Functioning/Environment Prior Level of Function : Other (comment)             Mobility Comments: unable to determine ADLs Comments: unable to determine        OT Problem List: Decreased strength;Decreased activity tolerance;Impaired balance (sitting and/or standing);Decreased coordination;Decreased cognition;Decreased safety awareness;Decreased knowledge of precautions      OT Treatment/Interventions: Self-care/ADL training;Therapeutic exercise;Neuromuscular education;Therapeutic activities;Cognitive remediation/compensation;Patient/family education;Balance training    OT Goals(Current goals can be found in the care plan section) Acute Rehab OT Goals OT Goal Formulation: Patient unable to participate in goal setting Time For Goal Achievement: 12/23/21 Potential to Achieve Goals: Fair ADL Goals Pt Will Perform Grooming: with supervision;standing Pt Will Perform Upper Body Dressing: with set-up;standing Pt Will Perform Lower Body Dressing: with set-up;sit to/from stand Pt Will Transfer to Toilet: with min guard assist;ambulating;regular height toilet  OT Frequency: Min 3X/week       AM-PAC OT "6 Clicks" Daily Activity     Outcome Measure Help from another person eating meals?: A Little Help from another person taking care of personal grooming?: A Little Help from another person toileting, which includes using toliet, bedpan, or urinal?: A Little Help from another person bathing (including washing, rinsing, drying)?: A Lot Help from another person to put on and taking off regular upper body clothing?: A Little Help from another person to put on and taking off regular lower body clothing?: A Lot 6 Click Score:  16   End of Session    Activity Tolerance: Patient tolerated treatment well (somewhat frustrated at end of session  but redirectable) Patient left: in bed;with bed alarm set;with call bell/phone within reach  OT Visit Diagnosis: Unsteadiness on feet (R26.81);Other abnormalities of gait and mobility (R26.89);Muscle weakness (generalized) (M62.81);Apraxia (R48.2);Other symptoms and signs involving the nervous system (R29.898);Other symptoms and signs involving cognitive function;Hemiplegia and hemiparesis                Time: 1450-1510 OT Time Calculation (min): 20 min Charges:  OT General Charges $OT Visit: 1 Visit OT Evaluation $OT Eval Moderate Complexity: 1 Mod    Kasandra Knudsen, OTR/L 12/09/2021, 3:33 PM

## 2021-12-09 NOTE — ED Notes (Signed)
[facility] contacted for pt to be transported to [facility]

## 2021-12-09 NOTE — Progress Notes (Signed)
  Transition of Care [facility]) Screening Note   Patient Details  Name: Wael Maestas Date of Birth: Jun 28, 1950   Transition of Care [facility]) CM/SW Contact:    Pollie Friar, RN Phone Number: 12/09/2021, 2:34 PM   Pt from home with roommate. CIR to work up. Transition of Care Department [facility]) has reviewed patient. We will continue to monitor patient advancement through interdisciplinary progression rounds. If new patient transition needs arise, please place a TOC consult.

## 2021-12-09 NOTE — Evaluation (Signed)
Clinical/Bedside Swallow Evaluation Patient Details  Name: [name] MRN: [id number] Date of Birth: Nov 04, 1950  Today's Date: 12/09/2021 Time: SLP Start Time (ACUTE ONLY): 6063 SLP Stop Time (ACUTE ONLY): 0902 SLP Time Calculation (min) (ACUTE ONLY): 11 min  Past Medical History:  Past Medical History:  Diagnosis Date   Anemia    normal Fe, nl B12, nl retic, nl EPO July '13   Aortic regurgitation    Moderate AI 04/17/19 echo   Blood transfusion without reported diagnosis    BPH (benign prostatic hyperplasia)    Chronic back pain    Chronic kidney disease    CKD III, obstructive nephropathy   Diverticulosis    Dysuria    Elevated PSA, greater than or equal to 20 ng/ml June '13   PSA 107   Hemorrhoids, internal, with bleeding, prolapse 09/19/2014   Hyperlipidemia 05/29/2014   Hypertension    Obstructive uropathy 11/27/2015   Pre-diabetes    per patient "reduced sugar intake"   Renal insufficiency    Tuberculosis    h/o PPD +   UTI (urinary tract infection)    Vitamin D deficiency 09/13/2017   Past Surgical History:  Past Surgical History:  Procedure Laterality Date   CARPAL TUNNEL RELEASE Right    COLONOSCOPY     HEMORRHOID BANDING     INSERTION OF MESH N/A 10/17/2019   Procedure: Insertion Of Mesh;  Surgeon: Ralene Ok, MD;  Location: [facility];  Service: General;  Laterality: N/A;   SPLENECTOMY     XI ROBOTIC ASSISTED SIMPLE PROSTATECTOMY N/A 03/09/2019   Procedure: XI ROBOTIC ASSISTED SIMPLE PROSTATECTOMY;  Surgeon: Cleon Gustin, MD;  Location: [facility];  Service: Urology;  Laterality: N/A;   XI ROBOTIC ASSISTED VENTRAL HERNIA N/A 10/17/2019   Procedure: XI ROBOTIC ASSISTED INCISIONAL HERNIA REPAIR WITH MESH;  Surgeon: Ralene Ok, MD;  Location: [facility];  Service: General;  Laterality: N/A;   HPI:  Pt is a 71 y/o male admitted secondary to rigth sided weakness and difficulty speaking. 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 PMH including but not limited to hypertension, hyperlipidemia, CKD.    Assessment / Plan / Recommendation  Clinical Impression  Pt's native language is [place] although he speaks [place] (noted to have aphasia). He intermittently comprehends commands and inconsistently benefits from gestural and written information. He was initially mildly resistant to therapist cutting food and demonstrating to use left hand to self feed. Mild right sided pocketing which he cleared spontaneously throughout breakfast meal. No indications of airway intrusion and recommend he continue regular texture, thin liquids, straws allowed and pills with thin. Will continue to see for swallow x 1 to ensure he is consistently clearing residue. SLP Visit Diagnosis: Dysphagia, unspecified (R13.10)    Aspiration Risk  Mild aspiration risk    Diet Recommendation Regular;Thin liquid   Liquid Administration via: Cup;Straw Medication Administration: Whole meds with liquid Supervision: Staff to assist with self feeding Compensations: Minimize environmental distractions;Slow rate;Small sips/bites;Lingual sweep for clearance of pocketing Postural Changes: Seated upright at 90 degrees    Other  Recommendations Oral Care Recommendations: Oral care BID    Recommendations for follow up therapy are one component of a multi-disciplinary discharge planning process, led by the attending physician.  Recommendations may be updated based on patient status, additional functional criteria and insurance authorization.  Follow up Recommendations Acute inpatient rehab (3hours/day)      Assistance Recommended at Discharge Frequent or constant Supervision/Assistance  Functional Status Assessment Patient has had a recent decline in their functional status and demonstrates the ability to make significant improvements in function in a reasonable and predictable amount of time.  Frequency and Duration min 2x/week  2 weeks        Prognosis Prognosis for Safe Diet Advancement: Good Barriers to Reach Goals:  (comprehension)      Swallow Study   General Date of Onset: 12/08/21 HPI: Pt is a 71 y/o male admitted secondary to rigth sided weakness and difficulty speaking. MRI of the brain revealed an M2 occlusion with acute posterior Left MCA territory infarct. PMH including but not limited to hypertension, hyperlipidemia, CKD. Type of Study: Bedside Swallow Evaluation Previous Swallow Assessment:  (none) Diet Prior to this Study: Regular;Thin liquids Temperature Spikes Noted: No Respiratory Status: Room air History of Recent Intubation: No Behavior/Cognition: Alert;Cooperative (some frustration) Oral Cavity Assessment: Within Functional Limits Oral Care Completed by SLP: No Oral Cavity - Dentition: Adequate natural dentition Vision: Functional for self-feeding Self-Feeding Abilities: Needs assist Patient Positioning: Upright in chair Baseline Vocal Quality: Normal Volitional Cough:  (pt did not perform suspect due to comprehension) Volitional Swallow:  (pt did not perform suspect due to comprehension)    Oral/Motor/Sensory Function Overall Oral Motor/Sensory Function: Within functional limits   Ice Chips Ice chips: Not tested   Thin Liquid Thin Liquid: Within functional limits Presentation: Cup    Nectar Thick Nectar Thick Liquid: Not tested   Honey Thick Honey Thick Liquid: Not tested   Puree Puree: Not tested   Solid     Solid: Impaired (right pocketing which he clears independently) Oral Phase Functional Implications: Right lateral sulci pocketing Pharyngeal Phase Impairments:  (none)      Ronniesha Seibold, Orbie Pyo 12/09/2021,9:53 AM

## 2021-12-09 NOTE — Progress Notes (Signed)
Inpatient Rehab Admissions Coordinator:   I spoke with pt.'s son, Legrand Como, regarding potential CIR admit. He is interested but unsure if family can provide 24/7 support to patient upon d/c. He asks that I wait to open a case with pt.'s insurance until he can confer with other family about SNF vs CIR.   Clemens Catholic, [place], [place] Admissions Coordinator  [phone] ([place]) [phone] (office)

## 2021-12-09 NOTE — Evaluation (Signed)
Speech Language Pathology Evaluation Patient Details Name: [name] MRN: [id number] DOB: 04-04-1951 Today's Date: 12/09/2021 Time: 1694-5038 SLP Time Calculation (min) (ACUTE ONLY): 11 min  Problem List:  Patient Active Problem List   Diagnosis Date Noted   Suspected cerebrovascular accident (CVA) 12/08/2021   Aortic insufficiency 11/30/2021   Right bundle branch block 11/30/2021   Olecranon bursitis, right elbow 06/13/2021   Rupture of UCL of left thumb 03/18/2021   Aortic atherosclerosis ([place]) 12/10/2020   Chronic pain of right knee 12/10/2020   Bilateral elbow joint pain 07/09/2020   Acute renal failure superimposed on chronic kidney disease ([place]) 11/12/2019   Dry eyes 11/12/2019   S/P hernia repair 10/17/2019   Abdominal pain 07/06/2019   BPH with obstruction/lower urinary tract symptoms 03/09/2019   Clot retention of urine 02/11/2019   Vitamin D deficiency 09/13/2017   Urinary tract infection without hematuria 07/23/2017   Low back pain 07/03/2017   Dizziness 06/30/2017   Acute gouty arthritis 12/01/2016   Degenerative arthritis of knee, bilateral 11/18/2016   Urinary retention due to benign prostatic hyperplasia 10/24/2016   Mass of right side of neck 10/20/2016   Gout 10/20/2016   Right knee pain 10/12/2016   Hypokalemia 05/28/2016   Encounter for well adult exam with abnormal findings 11/27/2015   Obstructive uropathy 11/27/2015   Elevated PSA 11/27/2015   Acute pyelonephritis 05/27/2015   Generalized bloating 05/27/2015   Chronic constipation 05/27/2015   Chest pain 05/27/2015   AKI (acute kidney injury) ([place]) 05/27/2015   Acute sinus infection 11/22/2014   Cough 09/25/2014   Hemorrhoids, internal, with bleeding, prolapse 09/19/2014   Chronic UTI 05/29/2014   Hyperlipidemia 05/29/2014   Lumbar radiculopathy 05/23/2014   Lumbar stenosis 11/20/2013   Abnormal glucose 11/06/2013   Unspecified hereditary and idiopathic peripheral neuropathy 01/22/2013    Cardiomyopathy due to hypertension ([place]) 12/28/2011   CKD (chronic kidney disease) stage 3, GFR 30-59 ml/min ([facility]) 11/24/2011   Hematuria 11/24/2011   Hydronephrosis 11/24/2011   Anemia 11/24/2011   BRADYCARDIA 02/05/2010   TINEA PEDIS 01/29/2010   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   PERIPHERAL EDEMA 01/29/2010   ABNORMAL ELECTROCARDIOGRAM 01/29/2010   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   Osteoarthrosis, generalized, 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   ONYCHOMYCOSIS, TOENAILS 10/02/2007   BPH (benign prostatic hyperplasia) 10/02/2007   Essential hypertension 03/06/2007   Past Medical History:  Past Medical History:  Diagnosis Date   Anemia    normal Fe, nl B12, nl retic, nl EPO July '13   Aortic regurgitation    Moderate AI 04/17/19 echo   Blood transfusion without reported diagnosis    BPH (benign prostatic hyperplasia)    Chronic back pain    Chronic kidney disease    CKD III, obstructive nephropathy   Diverticulosis    Dysuria    Elevated PSA, greater than or equal to 20 ng/ml June '13   PSA 107   Hemorrhoids, internal, with bleeding, prolapse 09/19/2014   Hyperlipidemia 05/29/2014   Hypertension    Obstructive uropathy 11/27/2015   Pre-diabetes    per patient "reduced sugar intake"   Renal insufficiency    Tuberculosis    h/o PPD +   UTI (urinary tract infection)    Vitamin D deficiency 09/13/2017   Past Surgical History:  Past Surgical History:  Procedure Laterality Date   CARPAL TUNNEL RELEASE Right    COLONOSCOPY     HEMORRHOID BANDING     INSERTION OF MESH N/A 10/17/2019   Procedure: Insertion Of Mesh;  Surgeon: Ralene Ok, MD;  Location: [facility];  Service: General;  Laterality: N/A;   SPLENECTOMY     XI ROBOTIC ASSISTED SIMPLE PROSTATECTOMY N/A 03/09/2019   Procedure: XI ROBOTIC ASSISTED SIMPLE PROSTATECTOMY;  Surgeon: Cleon Gustin, MD;  Location: [facility];  Service: Urology;  Laterality: N/A;   XI ROBOTIC ASSISTED VENTRAL HERNIA N/A 10/17/2019    Procedure: XI ROBOTIC ASSISTED INCISIONAL HERNIA REPAIR WITH MESH;  Surgeon: Ralene Ok, MD;  Location: [facility];  Service: General;  Laterality: N/A;   HPI:  Pt is a 71 y/o male admitted secondary to rigth sided weakness and difficulty speaking. MRI of the brain revealed an M2 occlusion with acute posterior Left MCA territory infarct. PMH including but not limited to hypertension, hyperlipidemia, CKD.   Assessment / Plan / Recommendation Clinical Impression  Pt speaks English although his native language is [place]. 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 Demonstrated difficulty expressing his needs during assessment, which were not synchronis  with his gesturing to get message across. Noted were semantic paraphasia, occasional neologisms and word/phrase perseverations. Cognitively, his sustained attention was reduced during breakfast meal and poor problem solving and mild resistance to assistance during attempts to self feed altough he did not fully comprehend intent of therapist. Counted to 6 with help to initiate and days of week needed written information. Comprehended one step commands with 50% accuracy or less. Pt would benefit from continued ST on acute care and acute inpatient rehab.    SLP Assessment  SLP Recommendation/Assessment: Patient needs continued Speech Lanaguage Pathology Services SLP Visit Diagnosis: Cognitive communication deficit (R41.841);Aphasia (R47.01)    Recommendations for follow up therapy are one component of a multi-disciplinary discharge planning process, led by the attending physician.  Recommendations may be updated based on patient status, additional functional criteria and insurance authorization.    Follow Up Recommendations  Acute inpatient rehab (3hours/day)    Assistance Recommended at Discharge  Frequent or constant Supervision/Assistance  Functional Status Assessment Patient has had a recent decline in their  functional status and demonstrates the ability to make significant improvements in function in a reasonable and predictable amount of time.  Frequency and Duration min 2x/week  2 weeks      SLP Evaluation Cognition  Overall Cognitive Status: Impaired/Different from baseline Arousal/Alertness: Awake/alert Orientation Level: Oriented to person (effected by aphasia) Attention: Sustained Sustained Attention: Impaired Sustained Attention Impairment: Verbal basic;Functional basic Memory:  (TBA) Awareness: Impaired Awareness Impairment: Emergent impairment Problem Solving: Impaired Problem Solving Impairment: Functional basic Behaviors: Poor frustration tolerance Safety/Judgment: Impaired       Comprehension  Auditory Comprehension Overall Auditory Comprehension: Impaired Yes/No Questions: Impaired Basic Immediate Environment Questions: 25-49% accurate Commands: Impaired One Step Basic Commands: 50-74% accurate (closer to 50%) Conversation: Simple Interfering Components: Attention Visual Recognition/Discrimination Discrimination: Not tested Reading Comprehension Reading Status: Impaired Word level: Impaired Sentence Level: Impaired    Expression Expression Primary Mode of Expression: Verbal Verbal Expression Overall Verbal Expression: Impaired Initiation: No impairment Level of Generative/Spontaneous Verbalization: Sentence Repetition: Impaired Level of Impairment: Word level;Phrase level Naming:  (To be assessed further) Written Expression Dominant Hand: Right Written Expression:  (TBA)   Oral / Motor  Oral Motor/Sensory Function Overall Oral Motor/Sensory Function: Within functional limits Motor Speech Overall Motor Speech: Appears within functional limits for tasks assessed Articulation: Within functional limitis Intelligibility: Intelligible Motor Planning: Witnin functional limits            Houston Siren 12/09/2021, 10:15 AM

## 2021-12-09 NOTE — ED Notes (Signed)
Pt stood beside bed to urinate, steady with this activity. Able to hold urinal by self in right hand. Continues to speak in word salad at times. Other times is clearly able to express "thank you, maam".

## 2021-12-09 NOTE — Evaluation (Signed)
Physical Therapy Evaluation Patient Details Name: [name] MRN: [id number] DOB: Oct 20, 1950 Today's Date: 12/09/2021  History of Present Illness  Pt is a 71 y/o male admitted secondary to rigth sided weakness and difficulty speaking. 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 PMH including but not limited to hypertension, hyperlipidemia, CKD.  Clinical Impression    Pt presented supine in bed with HOB elevated, awake and willing to participate in therapy session. Pt with significant cognitive deficits and expressive language difficulties with no family/caregivers present to provide any reliable home set-up or PLOF information. At the time of evaluation, pt required min A for bed mobility and mod A x2 for transfers. He demonstrated right sided weakness as compared to the left in both extremities. No knee buckling noted upon standing or with pivot transfer towards his left side. Pt would greatly benefit from intensive therapy services in the AIR setting upon d/c to maximize his safety and independence with functional mobility prior to returning home. PT will continue to f/u with pt acutely to progress mobility as tolerated.   Recommendations for follow up therapy are one component of a multi-disciplinary discharge planning process, led by the attending physician.  Recommendations may be updated based on patient status, additional functional criteria and insurance authorization.  Follow Up Recommendations Acute inpatient rehab (3hours/day)      Assistance Recommended at Discharge Frequent or constant Supervision/Assistance  Patient can return home with the following  Two people to help with walking and/or transfers;A lot of help with bathing/dressing/bathroom    Equipment Recommendations Other (comment) (TBD at next venue of care)  Recommendations for Other Services  Rehab consult    Functional Status Assessment Patient has had a recent decline in their functional status and  demonstrates the ability to make significant improvements in function in a reasonable and predictable amount of time.     Precautions / Restrictions Precautions Precautions: Fall Precaution Comments: R weakness Restrictions Weight Bearing Restrictions: No      Mobility  Bed Mobility Overal bed mobility: Needs Assistance Bed Mobility: Supine to Sit     Supine to sit: Min assist, HOB elevated     General bed mobility comments: increased time and effort needed, use of bed rails, HOB elevated, multimodal maximal cueing to complete, physical assistance needed to move R LE off of bed    Transfers Overall transfer level: Needs assistance Equipment used: 1 person hand held assist Transfers: Sit to/from Stand, Bed to chair/wheelchair/BSC Sit to Stand: Min assist, From elevated surface Stand pivot transfers: Mod assist, +2 physical assistance, +2 safety/equipment         General transfer comment: increased time and effort, bed elevated, maximal multimodal cueing needed for sequencing, pt able to stand with min A but required mod A x2 to pivot from EOB to recliner chair towards his left side    Ambulation/Gait                  Stairs            Wheelchair Mobility    Modified Rankin (Stroke Patients Only) Modified Rankin (Stroke Patients Only) Pre-Morbid Rankin Score: No symptoms Modified Rankin: Moderately severe disability     Balance Overall balance assessment: Needs assistance Sitting-balance support: Feet supported Sitting balance-Leahy Scale: Fair     Standing balance support: During functional activity, Single extremity supported Standing balance-Leahy Scale: Poor  Pertinent Vitals/Pain Pain Assessment Pain Assessment: Faces Faces Pain Scale: No hurt    Home Living Family/patient expects to be discharged to:: Unsure                   Additional Comments: pt with poor cognition and unable to  answer any questions regarding home set-up or PLOF with no family/caregivers present throughout    Prior Function Prior Level of Function : Other (comment) (unsure - see above)                     Hand Dominance        Extremity/Trunk Assessment   Upper Extremity Assessment Upper Extremity Assessment: Difficult to assess due to impaired cognition;RUE deficits/detail RUE Deficits / Details: pt able to raise through full shoulder flexion and elbow flex/ext ROM; however, with very poor motor coordination and motor control RUE Coordination: decreased fine motor;decreased gross motor    Lower Extremity Assessment Lower Extremity Assessment: Difficult to assess due to impaired cognition;RLE deficits/detail RLE Deficits / Details: pt able to return demonstration of seated marching/hip flexion and knee flex/ext; cognition limiting further assessment    Cervical / Trunk Assessment Cervical / Trunk Assessment: Normal  Communication   Communication: Expressive difficulties  Cognition Arousal/Alertness: Awake/alert Behavior During Therapy: Flat affect Overall Cognitive Status: Difficult to assess Area of Impairment: Following commands, Safety/judgement, Awareness, Problem solving                       Following Commands: Follows one step commands inconsistently Safety/Judgement: Decreased awareness of deficits, Decreased awareness of safety Awareness: Intellectual Problem Solving: Slow processing, Decreased initiation, Difficulty sequencing, Requires verbal cues, Requires tactile cues          General Comments      Exercises     Assessment/Plan    PT Assessment Patient needs continued PT services  PT Problem List Decreased strength;Decreased activity tolerance;Decreased range of motion;Decreased coordination;Decreased balance;Decreased mobility;Decreased cognition;Decreased knowledge of use of DME;Decreased safety awareness;Decreased knowledge of precautions        PT Treatment Interventions DME instruction;Gait training;Stair training;Therapeutic activities;Functional mobility training;Therapeutic exercise;Balance training;Neuromuscular re-education;Cognitive remediation;Patient/family education    PT Goals (Current goals can be found in the Care Plan section)  Acute Rehab PT Goals Patient Stated Goal: unable to state PT Goal Formulation: Patient unable to participate in goal setting Time For Goal Achievement: 12/23/21 Potential to Achieve Goals: Good    Frequency Min 4X/week     Co-evaluation               AM-PAC PT "6 Clicks" Mobility  Outcome Measure Help needed turning from your back to your side while in a flat bed without using bedrails?: A Little Help needed moving from lying on your back to sitting on the side of a flat bed without using bedrails?: A Little Help needed moving to and from a bed to a chair (including a wheelchair)?: A Lot Help needed standing up from a chair using your arms (e.g., wheelchair or bedside chair)?: A Little Help needed to walk in hospital room?: A Lot Help needed climbing 3-5 steps with a railing? : Total 6 Click Score: 14    End of Session Equipment Utilized During Treatment: Gait belt Activity Tolerance: Patient tolerated treatment well Patient left: in chair;with call bell/phone within reach;with chair alarm set;Other (comment) (NT present to assess vitals) Nurse Communication: Mobility status PT Visit Diagnosis: Other abnormalities of gait and mobility (R26.89)  Time: 0820-0839 PT Time Calculation (min) (ACUTE ONLY): 19 min   Charges:   PT Evaluation $PT Eval Moderate Complexity: 1 Mod          [name], PT, DPT  Acute Rehabilitation Services Office [place] 12/09/2021, 9:13 AM

## 2021-12-09 NOTE — Progress Notes (Addendum)
STROKE TEAM PROGRESS NOTE   INTERVAL HISTORY No family at the bedside. Patient has global aphasia. Intermittently and inconsistently following some simple mimics. No commands. BP is slightly elevated to allow for permissive HTN. CIR workup pending MRI scan of the brain shows left MCA branch infarct and MRI of the brain shows left M2 branch  occlusion. Vitals:   12/09/21 0030 12/09/21 0207 12/09/21 0223 12/09/21 0514  BP: (!) 161/100 (!) 163/108  (!) 160/91  Pulse: 66 73  75  Resp: 16 13  (!) 21  Temp: 98 F (36.7 C) 98.9 F (37.2 C)  98.2 F (36.8 C)  TempSrc: Oral Oral  Oral  SpO2: 97% 97%  97%  Weight:   117.5 kg    CBC:  Recent Labs  Lab 12/08/21 1841 12/08/21 1845 12/09/21 0507  WBC 6.4  --  5.9  NEUTROABS 1.8  --  2.5  HGB 15.4 15.6 15.6  HCT 45.5 46.0 47.0  MCV 87.7  --  86.4  PLT 282  --  808   Basic Metabolic Panel:  Recent Labs  Lab 12/08/21 1841 12/08/21 1845 12/09/21 0507  NA 141 143 144  K 3.0* 3.1* 3.0*  CL 103 101 104  CO2 29  --  27  GLUCOSE 98 93 82  BUN '20 18 15  '$ CREATININE 1.74* 2.30* 1.94*  CALCIUM 9.2  --  9.2  MG  --   --  1.8  PHOS  --   --  2.5   Lipid Panel:  Recent Labs  Lab 12/09/21 0507  CHOL 123  TRIG 36  HDL 48  CHOLHDL 2.6  VLDL 7  LDLCALC 68   HgbA1c:  Recent Labs  Lab 12/09/21 0507  HGBA1C 5.6   Urine Drug Screen:  Recent Labs  Lab 12/08/21 2022  LABOPIA NONE DETECTED  COCAINSCRNUR NONE DETECTED  LABBENZ NONE DETECTED  AMPHETMU NONE DETECTED  THCU NONE DETECTED  LABBARB NONE DETECTED    Alcohol Level  Recent Labs  Lab 12/08/21 1841  ETH <10    IMAGING past 24 hours MR ANGIO HEAD WO CONTRAST  Result Date: 12/09/2021 CLINICAL DATA:  71 year old male with confusion, right side weakness. Left MCA infarct on MRI. EXAM: MRA HEAD WITHOUT CONTRAST TECHNIQUE: Angiographic images of the Circle of Willis were acquired using MRA technique without intravenous contrast. COMPARISON:  Brain MRI and neck MRA today  reported separately. FINDINGS: Anterior circulation: Antegrade flow in dolichoectatic ICA siphons. Infundibulum of the supraclinoid right ICA on series 5, image 112. The vessel measures up to 8 mm diameter. 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 Siphon irregularity in keeping with some atherosclerosis, but no significant siphon stenosis. Patent carotid termini, MCA and ACA origins. Tortuous and ectatic A1 segments measure up to 4 mm diameter. Diminutive anterior communicating artery. There is moderate distal ACA A2 segment stenosis on series 1038, image 1. Right MCA M1 segment bifurcates without stenosis. Right MCA branches are attack and mildly irregular. Left MCA M1 segment bifurcates early without stenosis. There is moderate to severe attenuation of a posterior left MCA M2 branch on series 1044, image 1, probably series 5, image 140 of the source images. That branch may be occluded throughout. Other left MCA branches appear patent with mild irregularity. Posterior circulation: Antegrade flow in the posterior circulation with dolichoectatic vertebrobasilar system. Distal right vertebral artery is 6 mm diameter. Basilar artery is up to 8 mm diameter. Ectatic basilar tip but  no discrete aneurysm. No stenosis. SCA and PCA origins are patent. Posterior communicating arteries are diminutive or absent. Bilateral PCA branches appear within normal limits. Anatomic variants: None significant. Other: Brain MRI today reported separately. IMPRESSION: 1. Positive for Left MCA posterior M2 branch poor flow or occlusion. 2. 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 Distal Right ICA and Basilar arteries are up to 8 mm diameter. 3. Evidence of superimposed intracranial atherosclerosis, including moderate bilateral distal ACA A2 segment stenosis. Electronically Signed    By: Genevie Ann M.D.   On: 12/09/2021 05:30   MR ANGIO NECK WO CONTRAST  Result Date: 12/09/2021 CLINICAL DATA:  71 year old male with confusion, right side weakness. Left MCA infarct on MRI. EXAM: MRA NECK WITHOUT CONTRAST TECHNIQUE: Angiographic images of the neck were acquired using MRA technique without intravenous contrast. Carotid stenosis measurements (when applicable) are obtained utilizing NASCET criteria, using the distal internal carotid diameter as the denominator. COMPARISON:  Brain MRI today reported separately. Neck CT 04/27/2018. FINDINGS: Aortic arch: Not well evaluated. Right carotid system: Antegrade flow. Visible right CCA is patent and mildly tortuous. Right carotid bifurcation appears capacious. Mildly tortuous cervical right ICA, no stenosis identified. Left carotid system: Antegrade Flow. Similar tortuosity. No stenosis identified. Vertebral arteries: Fairly codominant and tortuous bilateral cervical vertebral arteries with antegrade flow signal to the skull base. No stenosis identified. 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 IMPRESSION: 1. Patent cervical carotid and vertebral arteries with tortuosity. No stenosis identified. 2. 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 Electronically Signed   By: Genevie Ann M.D.   On: 12/09/2021 05:17   MR BRAIN WO CONTRAST  Result Date: 12/09/2021 CLINICAL DATA:  71 year old male with confusion, right side weakness. EXAM: MRI HEAD WITHOUT CONTRAST TECHNIQUE: Multiplanar, multiecho pulse sequences of the brain and surrounding structures were obtained without intravenous contrast. COMPARISON:  Plain head CT 12/08/2021. FINDINGS: Brain: Confluent roughly 6 cm area of restricted diffusion in the posterior left MCA territory including lateral perirolandic cortex, post centri parietal lobe and the superior sensory strip. Ischemia also tracks into the posterior left insula. See series 7, image 56). Early  cytotoxic edema. No convincing acute hemorrhage. No other vascular territory ischemia. Patchy and confluent bilateral cerebral white matter T2 and FLAIR hyperintensity. Occasional chronic micro hemorrhages in the bilateral deep gray nuclei, white matter, right cerebellum. Brainstem and cerebellum are least affected. No cortical encephalomalacia is identified. No midline shift, mass effect, evidence of mass lesion, ventriculomegaly, extra-axial collection or acute intracranial hemorrhage. Cervicomedullary junction and pituitary are within normal limits. 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 See MRA today reported separately. Skull and upper cervical spine: Visualized bone marrow signal is within normal limits. Some degenerative changes in the visible cervical spine. Sinuses/Orbits: Mildly Disconjugate gaze. Right maxillary mucous retention cyst but other paranasal Visualized paranasal sinuses and mastoids are stable and well aerated. Other: Grossly normal visible internal auditory structures. Negative visible scalp and face. IMPRESSION: 1. Acute posterior Left MCA territory infarct. No associated hemorrhage or mass effect. 2. 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 See MRA today reported separately. 3. 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 Electronically Signed   By: Genevie Ann M.D.   On: 12/09/2021 05:14   DG Pelvis 1-2  Views  Result Date: 12/09/2021 CLINICAL DATA:  Encounter for imaging to screen for metal prior to MRI. EXAM: PELVIS - 1-2 VIEW COMPARISON:  None Available. FINDINGS: There is no evidence of pelvic fracture or diastasis. Mild-to-moderate degenerative changes are noted at the hips bilaterally. No radiopaque foreign body. IMPRESSION: No radiopaque foreign body.  Electronically Signed   By: Brett Fairy M.D.   On: 12/09/2021 04:31   DG Chest 1 View  Result Date: 12/09/2021 CLINICAL DATA:  Image discrete for metal prior to MRI. EXAM: CHEST  1 VIEW; ABDOMEN - 1 VIEW COMPARISON:  10/20/2019. FINDINGS: The heart is enlarged and the mediastinal contour stable. Minimal atelectasis is noted at the left lung base. No effusion or pneumothorax. No acute osseous abnormality. Nonobstructive bowel-gas pattern. Multiple surgical clips are noted in the epigastric and left upper quadrant regions. No unusual calcification. Degenerative changes are present in the lumbar spine. IMPRESSION: 1. Multiple surgical clips in the epigastric and left upper quadrant region. 2. Mild atelectasis at the left lung base. 3. Cardiomegaly. 4. Nonobstructive bowel-gas pattern. Electronically Signed   By: Brett Fairy M.D.   On: 12/09/2021 04:27   DG Abd 1 View  Result Date: 12/09/2021 CLINICAL DATA:  Image discrete for metal prior to MRI. EXAM: CHEST  1 VIEW; ABDOMEN - 1 VIEW COMPARISON:  10/20/2019. FINDINGS: The heart is enlarged and the mediastinal contour stable. Minimal atelectasis is noted at the left lung base. No effusion or pneumothorax. No acute osseous abnormality. Nonobstructive bowel-gas pattern. Multiple surgical clips are noted in the epigastric and left upper quadrant regions. No unusual calcification. Degenerative changes are present in the lumbar spine. IMPRESSION: 1. Multiple surgical clips in the epigastric and left upper quadrant region. 2. Mild atelectasis at the left lung base. 3. Cardiomegaly. 4. Nonobstructive bowel-gas pattern. Electronically Signed   By: Brett Fairy M.D.   On: 12/09/2021 04:27   CT Head Wo Contrast  Result Date: 12/08/2021 CLINICAL DATA:  Altered mental status, confusion EXAM: CT HEAD WITHOUT CONTRAST TECHNIQUE: Contiguous axial images were obtained from the base of the skull through the vertex without intravenous contrast. RADIATION DOSE REDUCTION: This  exam was performed according to the departmental dose-optimization program which includes automated exposure control, adjustment of the mA and/or kV according to patient size and/or use of iterative reconstruction technique. COMPARISON:  CT head 05/22/2020 FINDINGS: Brain: There is no acute intracranial hemorrhage, extra-axial fluid collection, or acute infarct. Parenchymal volume is within normal limits for age. The ventricles are normal in size. Patchy and confluent hypodensity throughout the subcortical and periventricular white matter likely reflects sequela of chronic white matter microangiopathy. There is no mass lesion.  There is no mass effect or midline shift. Vascular: The intracranial ICAs and posterior circulation are ectatic with the left supraclinoid ICA measuring up to 7 mm in the basilar artery measuring up to 7 mm, similar to the prior study. No dense vessel is seen. Skull: Normal. Negative for fracture or focal lesion. Sinuses/Orbits: The imaged paranasal sinuses are clear. The imaged globes and orbits are unremarkable. Other: None. IMPRESSION: 1. No acute intracranial pathology. Unchanged chronic white matter microangiopathy. 2. 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 Electronically Signed   By: Valetta Mole M.D.   On: 12/08/2021 19:33    PHYSICAL EXAM  Physical Exam  Constitutional: Appears well-developed and well-nourished.  Elderly African male Cardiovascular: Normal rate and regular rhythm.  Respiratory: Effort normal, non-labored breathing  Neuro: Mental Status: Patient is awake, globally aphasia.  Speech is gibberish nonsensical words.  Not able to follow 1 simple midline commands.  Does not mimic. Cranial Nerves: II: blinks to threat bilaterally III,IV, VI: EOMI without ptosis or diploplia.  V: Facial sensation is symmetric to temperature VII: Facial movement is symmetric resting and smiling VIII: Hearing is intact to voice X: Palate elevates  symmetrically XI: Shoulder shrug is symmetric. XII: Tongue protrudes midline without atrophy or fasciculations.  Motor: Tone is normal. Bulk is normal. 5/5 strength was present in all four extremities.  Though prefers to move left side more Sensory: Sensation is symmetric to light touch and temperature in the arms and legs. No extinction to DSS present.  Deep Tendon Reflexes: 2+ and symmetric in the biceps and patellae.  Plantars: Toes are downgoing bilaterally.  Cerebellar: FNF and HKS are intact bilaterally   ASSESSMENT/PLAN [name] is a 71 y.o. male with history of  HTN, HLD, chronic HFpEF, mod AI, BPH, OAB, gout who presented to [facility] with dysphasia and right-sided weakness. He was found by his roommate who had not seen him since 7pm on 7/3. MRI showed a left M2 infarct.  Consider a loop recorder vs 30 day monitor for long term cardiac monitoring. 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 Small vessel disease. MRI  Acute posterior left MCA territory infarct MRA  Positive for Left MCA posterior M2 branch poor flow or occlusion. 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 Distal Right ICA and Basilar arteries are up to 8 mm diameter. Evidence of superimposed intracranial atherosclerosis, including moderate bilateral distal ACA A2 segment stenosis. 2D Echo bubble study negative, EF 55-60% LDL 68 HgbA1c 5.6 VTE prophylaxis - lovenox    Diet   Diet Heart Room service appropriate? Yes; Fluid consistency: Thin   aspirin 81 mg daily prior to admission, now on aspirin 81 mg daily and clopidogrel 75 mg daily.  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 MD to update note as needed.   Janine Ores, DNP, FNP-BC [facility] Pager: [phone]  STROKE MD NOTE :  I have personally obtained history,examined this patient, reviewed notes, independently viewed imaging studies, participated in medical decision making and plan of care.ROS completed by me personally and pertinent positives fully documented  I have made any additions or clarifications directly to the above note. Agree with note above.  Patient presented with aphasia due to left MCA branch infarct left MCA branch occlusion likely of embolic etiology.  Unfortunately severely aphasic.  No family available at the bedside for discussion.  We will likely need prolonged cardiac monitoring to look for paroxysmal A-fib at discharge.  Recommend aspirin and Plavix for 3 weeks followed by Plavix alone and aggressive risk factor modification.  Physical occupational and speech therapies.  Discussed with  Dr.Grunz.  Greater than 50% time during this stroke: Reviewed.  Possible coordination of follow-up of his aphasia and embolic stroke and discussion about evaluation and care team and answering questions  Antony Contras, MD Medical Director [place] Pager: [phone] 12/09/2021 4:33 PM   To contact Stroke Continuity provider, please refer to [email or web address] After hours, contact General Neurology

## 2021-12-09 NOTE — Progress Notes (Signed)
Progress Note  Patient: [name] PTW:[id number] DOB: Dec 21, 1950  DOA: 12/08/2021  DOS: 12/09/2021    Brief hospital course: [name] is a 71 y.o. male with a history of HTN, HLD, chronic HFpEF, mod AI, BPH, OAB, gout who presented to [facility] with dysphasia and right-sided weakness. CT head was nonacutre, though he was admitted to [facility] for MRI which showed M2 branch occlusion with infarct. Neurology is consulted, DAPT initiated, and work up is underway. CIR disposition is being sought.   Assessment and Plan: Acute left MCA branch (M2) CVA: With resultant significant expressive aphasia and some right-sided weakness.  - Neurology consulted, discussed with Dr. Leonie Man.  - Will continue with work up including echo which is performed and pending.  - Continue cardiac telemetry. Will anticipate loop recorder vs. 30-day cardiac monitoring at discharge with high suspicion for embolic etiology.  - Cotninue ASA, plavix - Continue high-intensity statin. LDL is 68. HbA1c 5.6%.  - PT/OT/SLP consulted, CIR PM&R MD consulted for admission.   HTN:  - Permissive HTN for now.  - Med rec still pending.  HLD:  - Continue atorvastatin. LDL is at goal at 68.   Chronic HFpEF, moderate AI:  - Echo being updated. Appears compensated currently.   Gout: Chronic, quiescent.  - Monitor clinically.   OAB:  - Continue myrbetriq unless BP becomes too severely elevated. Caution advised with concomitant BPH. Monitoring UOP.   Chronic right knee pain, usually walks with cane. Will monitor.   Hypokalemia:  - Supplement  Hyperbilirubinemia: Monitor in AM to decide on further work up. No evidence of hemolysis or other LFT abnormalities, ?Gilbert's.   Obesity: Estimated body mass index is 34.18 kg/m as calculated from the following:   Height as of 11/30/21: '6\' 1"'$  (1.854 m).   Weight as of this encounter: 117.5 kg.  Subjective: Only repeats certain phrases without concordance to questions/statements.    Objective: Vitals:   12/09/21 0514 12/09/21 0835 12/09/21 1000 12/09/21 1119  BP: (!) 160/91 (!) 153/108  (!) 164/111  Pulse: 75 87  69  Resp: (!) '21 19 19 17  '$ Temp: 98.2 F (36.8 C) 97.7 F (36.5 C)  97.9 F (36.6 C)  TempSrc: Oral Oral  Axillary  SpO2: 97% 96%  97%  Weight:       Gen: 71 y.o. male in no distress Pulm: Nonlabored breathing room air. Clear CV: Regular rate and rhythm. No murmur, rub, or gallop. No JVD, no dependent edema. GI: Abdomen soft, non-tender, non-distended, with normoactive bowel sounds.  Ext: Warm, no deformities Skin: No rashes, lesions or ulcers on visualized skin. Neuro: Alert, not cooperative with exam on my evaluation. Repeats phrases not appropriate with conversation. Initially felt he had right hemiparesis though with nailbed pressure, does move extremities.  Data Personally reviewed: CBC: Recent Labs  Lab 12/08/21 1841 12/08/21 1845 12/09/21 0507  WBC 6.4  --  5.9  NEUTROABS 1.8  --  2.5  HGB 15.4 15.6 15.6  HCT 45.5 46.0 47.0  MCV 87.7  --  86.4  PLT 282  --  700   Basic Metabolic Panel: Recent Labs  Lab 12/08/21 1841 12/08/21 1845 12/09/21 0507  NA 141 143 144  K 3.0* 3.1* 3.0*  CL 103 101 104  CO2 29  --  27  GLUCOSE 98 93 82  BUN '20 18 15  '$ CREATININE 1.74* 2.30* 1.94*  CALCIUM 9.2  --  9.2  MG  --   --  1.8  PHOS  --   --  2.5   GFR: Estimated Creatinine Clearance: 46.9 mL/min (A) (by C-G formula based on SCr of 1.94 mg/dL (H)). Liver Function Tests: Recent Labs  Lab 12/08/21 1841 12/09/21 0507  AST 19 19  ALT 15 15  ALKPHOS 100 90  BILITOT 1.0 1.3*  PROT 7.8 7.2  ALBUMIN 4.1 3.5   No results for input(s): "LIPASE", "AMYLASE" in the last 168 hours. Recent Labs  Lab 12/08/21 1844  AMMONIA <10   Coagulation Profile: Recent Labs  Lab 12/08/21 1841  INR 1.0   Cardiac Enzymes: No results for input(s): "CKTOTAL", "CKMB", "CKMBINDEX", "TROPONINI" in the last 168 hours. BNP (last 3 results) No results  for input(s): "PROBNP" in the last 8760 hours. HbA1C: Recent Labs    12/09/21 0507  HGBA1C 5.6   CBG: Recent Labs  Lab 12/08/21 1833  GLUCAP 83   Lipid Profile: Recent Labs    12/09/21 0507  CHOL 123  HDL 48  LDLCALC 68  TRIG 36  CHOLHDL 2.6   Thyroid Function Tests: No results for input(s): "TSH", "T4TOTAL", "FREET4", "T3FREE", "THYROIDAB" in the last 72 hours. Anemia Panel: No results for input(s): "VITAMINB12", "FOLATE", "FERRITIN", "TIBC", "IRON", "RETICCTPCT" in the last 72 hours. Urine analysis:    Component Value Date/Time   COLORURINE STRAW (A) 12/08/2021 2022   APPEARANCEUR CLEAR 12/08/2021 2022   LABSPEC 1.005 12/08/2021 2022   PHURINE 7.0 12/08/2021 2022   GLUCOSEU NEGATIVE 12/08/2021 2022   GLUCOSEU NEGATIVE 06/12/2021 1510   HGBUR SMALL (A) 12/08/2021 2022   HGBUR negative 01/29/2010 0000   BILIRUBINUR NEGATIVE 12/08/2021 2022   BILIRUBINUR neg 11/26/2017 1304   KETONESUR NEGATIVE 12/08/2021 2022   PROTEINUR NEGATIVE 12/08/2021 2022   UROBILINOGEN 0.2 06/12/2021 1510   NITRITE NEGATIVE 12/08/2021 2022   LEUKOCYTESUR NEGATIVE 12/08/2021 2022   Recent Results (from the past 240 hour(s))  Resp Panel by RT-PCR (Flu A&B, Covid) Anterior Nasal Swab     Status: None   Collection Time: 12/08/21  6:41 PM   Specimen: Anterior Nasal Swab  Result Value Ref Range Status   SARS Coronavirus 2 by RT PCR NEGATIVE NEGATIVE Final    Comment: (NOTE) SARS-CoV-2 target nucleic acids are NOT DETECTED.  The SARS-CoV-2 RNA is generally detectable in upper respiratory specimens during the acute phase of infection. The lowest concentration of SARS-CoV-2 viral copies this assay can detect is 138 copies/mL. A negative result does not preclude SARS-Cov-2 infection and should not be used as the sole basis for treatment or other patient management decisions. A negative result may occur with  improper specimen collection/handling, submission of specimen other than  nasopharyngeal swab, presence of viral mutation(s) within the areas targeted by this assay, and inadequate number of viral copies(<138 copies/mL). A negative result must be combined with clinical observations, patient history, and epidemiological information. The expected result is Negative.  Fact Sheet for Patients:  EntrepreneurPulse.com.au  Fact Sheet for Healthcare Providers:  IncredibleEmployment.be  This test is no t yet approved or cleared by the [place] FDA and  has been authorized for detection and/or diagnosis of SARS-CoV-2 by FDA under an Emergency Use Authorization (EUA). This EUA will remain  in effect (meaning this test can be used) for the duration of the COVID-19 declaration under Section 564(b)(1) of the Act, 21 U.S.C.section 360bbb-3(b)(1), unless the authorization is terminated  or revoked sooner.       Influenza A by PCR NEGATIVE NEGATIVE Final   Influenza B by PCR NEGATIVE NEGATIVE Final    Comment: (NOTE)  The Xpert Xpress SARS-CoV-2/FLU/RSV plus assay is intended as an aid in the diagnosis of influenza from Nasopharyngeal swab specimens and should not be used as a sole basis for treatment. Nasal washings and aspirates are unacceptable for Xpert Xpress SARS-CoV-2/FLU/RSV testing.  Fact Sheet for Patients: EntrepreneurPulse.com.au  Fact Sheet for Healthcare Providers: IncredibleEmployment.be  This test is not yet approved or cleared by the [place] FDA and has been authorized for detection and/or diagnosis of SARS-CoV-2 by FDA under an Emergency Use Authorization (EUA). This EUA will remain in effect (meaning this test can be used) for the duration of the COVID-19 declaration under Section 564(b)(1) of the Act, 21 U.S.C. section 360bbb-3(b)(1), unless the authorization is terminated or revoked.  Performed at [facility], [place] [address], [place], [place] [address]      ECHOCARDIOGRAM COMPLETE BUBBLE STUDY  Result Date: 12/09/2021    ECHOCARDIOGRAM REPORT   Patient Name:   [name] Date of Exam: 12/09/2021 Medical Rec #:  [id number]    Height:       73.0 in Accession #:    [id number]   Weight:       259.0 lb Date of Birth:  08-01-1950    BSA:          2.402 m Patient Age:    72 years     BP:           160/91 mmHg Patient Gender: M            HR:           83 bpm. Exam Location:  Inpatient Procedure: 2D Echo, Cardiac Doppler, Color Doppler and Saline Contrast Bubble            Study Indications:    Stroke  History:        Patient has prior history of Echocardiogram examinations. Risk                 Factors:Hypertension.  Sonographer:    Jyl Heinz Referring Phys: [id number] [place]  1. Left ventricular ejection fraction, by estimation, is 55 to 60%. The left ventricle has normal function. The left ventricle has no regional wall motion abnormalities. There is mild concentric left ventricular hypertrophy. Left ventricular diastolic parameters are indeterminate.  2. Right ventricular systolic function is normal. The right ventricular size is normal.  3. The mitral valve is normal in structure. No evidence of mitral valve regurgitation. No evidence of mitral stenosis.  4. The aortic valve is tricuspid. There is mild thickening of the aortic valve. Aortic valve regurgitation is mild to moderate. Aortic valve sclerosis/calcification is present, without any evidence of aortic stenosis.  5. There is mild dilatation of the ascending aorta, measuring 38 mm. There is moderate dilatation of the aortic root, measuring 41 mm.  6. The inferior vena cava is normal in size with greater than 50% respiratory variability, suggesting right atrial pressure of 3 mmHg. FINDINGS  Left Ventricle: Left ventricular ejection fraction, by estimation, is 55 to 60%. The left ventricle has normal function. The left ventricle has no regional wall motion  abnormalities. The left ventricular internal cavity size was normal in size. There is  mild concentric left ventricular hypertrophy. Left ventricular diastolic parameters are indeterminate. Right Ventricle: The right ventricular size is normal. No increase in right ventricular wall thickness. Right ventricular systolic function is normal. Left Atrium: Left atrial size was normal in size. Right Atrium: Right atrial size was normal in size.  Pericardium: There is no evidence of pericardial effusion. Mitral Valve: The mitral valve is normal in structure. No evidence of mitral valve regurgitation. No evidence of mitral valve stenosis. Tricuspid Valve: The tricuspid valve is normal in structure. Tricuspid valve regurgitation is not demonstrated. No evidence of tricuspid stenosis. Aortic Valve: The aortic valve is tricuspid. There is mild thickening of the aortic valve. Aortic valve regurgitation is mild to moderate. Aortic regurgitation PHT measures 452 msec. Aortic valve sclerosis/calcification is present, without any evidence of aortic stenosis. Aortic valve peak gradient measures 8.6 mmHg. Pulmonic Valve: The pulmonic valve was normal in structure. Pulmonic valve regurgitation is not visualized. No evidence of pulmonic stenosis. Aorta: There is mild dilatation of the ascending aorta, measuring 38 mm. There is moderate dilatation of the aortic root, measuring 41 mm. Venous: The inferior vena cava is normal in size with greater than 50% respiratory variability, suggesting right atrial pressure of 3 mmHg. IAS/Shunts: No atrial level shunt detected by color flow Doppler. Agitated saline contrast was given intravenously to evaluate for intracardiac shunting.  LEFT VENTRICLE PLAX 2D LVIDd:         5.40 cm      Diastology LVIDs:         3.80 cm      LV e' medial:    7.46 cm/s LV PW:         1.10 cm      LV E/e' medial:  10.3 LV IVS:        1.30 cm      LV e' lateral:   7.77 cm/s LVOT diam:     2.30 cm      LV E/e' lateral:  9.9 LV SV:         101 LV SV Index:   42 LVOT Area:     4.15 cm  LV Volumes (MOD) LV vol d, MOD A2C: 115.0 ml LV vol d, MOD A4C: 144.0 ml LV vol s, MOD A2C: 48.6 ml LV vol s, MOD A4C: 60.4 ml LV SV MOD A2C:     66.4 ml LV SV MOD A4C:     144.0 ml LV SV MOD BP:      74.3 ml RIGHT VENTRICLE             IVC RV Basal diam:  3.30 cm     IVC diam: 1.80 cm RV S prime:     10.30 cm/s TAPSE (M-mode): 1.6 cm LEFT ATRIUM             Index        RIGHT ATRIUM           Index LA diam:        4.60 cm 1.92 cm/m   RA Area:     15.20 cm LA Vol (A2C):   67.3 ml 28.02 ml/m  RA Volume:   34.20 ml  14.24 ml/m LA Vol (A4C):   73.4 ml 30.56 ml/m LA Biplane Vol: 71.4 ml 29.73 ml/m  AORTIC VALVE AV Area (Vmax): 3.87 cm AV Vmax:        147.00 cm/s AV Peak Grad:   8.6 mmHg LVOT Vmax:      137.00 cm/s LVOT Vmean:     102.000 cm/s LVOT VTI:       0.242 m AI PHT:         452 msec  AORTA Ao Root diam: 4.10 cm Ao Asc diam:  3.80 cm MITRAL VALVE MV Area (PHT): 7.82 cm  SHUNTS MV Decel Time: 97 msec     Systemic VTI:  0.24 m MV E velocity: 76.60 cm/s  Systemic Diam: 2.30 cm MV A velocity: 57.20 cm/s MV E/A ratio:  1.34 Kardie Tobb DO Electronically signed by Berniece Salines DO Signature Date/Time: 12/09/2021/1:02:52 PM    Final    MR ANGIO HEAD WO CONTRAST  Result Date: 12/09/2021 CLINICAL DATA:  71 year old male with confusion, right side weakness. Left MCA infarct on MRI. EXAM: MRA HEAD WITHOUT CONTRAST TECHNIQUE: Angiographic images of the Circle of Willis were acquired using MRA technique without intravenous contrast. COMPARISON:  Brain MRI and neck MRA today reported separately. FINDINGS: Anterior circulation: Antegrade flow in dolichoectatic ICA siphons. Infundibulum of the supraclinoid right ICA on series 5, image 112. The vessel measures up to 8 mm diameter. 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 Siphon irregularity in keeping with some atherosclerosis, but no significant  siphon stenosis. Patent carotid termini, MCA and ACA origins. Tortuous and ectatic A1 segments measure up to 4 mm diameter. Diminutive anterior communicating artery. There is moderate distal ACA A2 segment stenosis on series 1038, image 1. Right MCA M1 segment bifurcates without stenosis. Right MCA branches are attack and mildly irregular. Left MCA M1 segment bifurcates early without stenosis. There is moderate to severe attenuation of a posterior left MCA M2 branch on series 1044, image 1, probably series 5, image 140 of the source images. That branch may be occluded throughout. Other left MCA branches appear patent with mild irregularity. Posterior circulation: Antegrade flow in the posterior circulation with dolichoectatic vertebrobasilar system. Distal right vertebral artery is 6 mm diameter. Basilar artery is up to 8 mm diameter. Ectatic basilar tip but no discrete aneurysm. No stenosis. SCA and PCA origins are patent. Posterior communicating arteries are diminutive or absent. Bilateral PCA branches appear within normal limits. Anatomic variants: None significant. Other: Brain MRI today reported separately. IMPRESSION: 1. Positive for Left MCA posterior M2 branch poor flow or occlusion. 2. 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 Distal Right ICA and Basilar arteries are up to 8 mm diameter. 3. Evidence of superimposed intracranial atherosclerosis, including moderate bilateral distal ACA A2 segment stenosis. Electronically Signed   By: Genevie Ann M.D.   On: 12/09/2021 05:30   MR ANGIO NECK WO CONTRAST  Result Date: 12/09/2021 CLINICAL DATA:  71 year old male with confusion, right side weakness. Left MCA infarct on MRI. EXAM: MRA NECK WITHOUT CONTRAST TECHNIQUE: Angiographic images of the neck were acquired using MRA technique without intravenous contrast. Carotid stenosis measurements (when applicable) are  obtained utilizing NASCET criteria, using the distal internal carotid diameter as the denominator. COMPARISON:  Brain MRI today reported separately. Neck CT 04/27/2018. FINDINGS: Aortic arch: Not well evaluated. Right carotid system: Antegrade flow. Visible right CCA is patent and mildly tortuous. Right carotid bifurcation appears capacious. Mildly tortuous cervical right ICA, no stenosis identified. Left carotid system: Antegrade Flow. Similar tortuosity. No stenosis identified. Vertebral arteries: Fairly codominant and tortuous bilateral cervical vertebral arteries with antegrade flow signal to the skull base. No stenosis identified. 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 IMPRESSION: 1. Patent cervical carotid and vertebral arteries with tortuosity. No stenosis identified. 2. 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 Electronically Signed   By: Genevie Ann M.D.   On: 12/09/2021 05:17   MR BRAIN WO CONTRAST  Result Date: 12/09/2021 CLINICAL DATA:  71 year old male with confusion, right side weakness. EXAM: MRI HEAD WITHOUT CONTRAST TECHNIQUE: Multiplanar, multiecho pulse sequences of the brain and surrounding structures were obtained without intravenous contrast. COMPARISON:  Plain head CT 12/08/2021. FINDINGS: Brain: Confluent roughly 6 cm area of restricted diffusion in the posterior left MCA territory including lateral perirolandic cortex, post centri parietal lobe and the superior sensory strip. Ischemia also tracks into the posterior left insula. See series 7, image 56). Early cytotoxic edema. No convincing acute hemorrhage. No other vascular territory ischemia. Patchy and confluent bilateral cerebral white matter T2 and FLAIR hyperintensity. Occasional chronic micro hemorrhages in the bilateral deep gray nuclei, white matter, right cerebellum. Brainstem and cerebellum are least affected. No cortical encephalomalacia is identified. No midline shift, mass  effect, evidence of mass lesion, ventriculomegaly, extra-axial collection or acute intracranial hemorrhage. Cervicomedullary junction and pituitary are within normal limits. 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 See MRA today reported separately. Skull and upper cervical spine: Visualized bone marrow signal is within normal limits. Some degenerative changes in the visible cervical spine. Sinuses/Orbits: Mildly Disconjugate gaze. Right maxillary mucous retention cyst but other paranasal Visualized paranasal sinuses and mastoids are stable and well aerated. Other: Grossly normal visible internal auditory structures. Negative visible scalp and face. IMPRESSION: 1. Acute posterior Left MCA territory infarct. No associated hemorrhage or mass effect. 2. 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 See MRA today reported separately. 3. 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 Electronically Signed   By: Genevie Ann M.D.   On: 12/09/2021 05:14   DG Pelvis 1-2 Views  Result Date: 12/09/2021 CLINICAL DATA:  Encounter for imaging to screen for metal prior to MRI. EXAM: PELVIS - 1-2 VIEW COMPARISON:  None Available. FINDINGS: There is no evidence of pelvic fracture or diastasis. Mild-to-moderate degenerative changes are noted at the hips bilaterally. No radiopaque foreign body. IMPRESSION: No radiopaque foreign body. Electronically Signed   By: Brett Fairy M.D.   On: 12/09/2021 04:31   DG Chest 1 View  Result Date: 12/09/2021 CLINICAL DATA:  Image discrete for metal prior to MRI. EXAM: CHEST  1 VIEW; ABDOMEN - 1 VIEW COMPARISON:  10/20/2019. FINDINGS: The heart is enlarged and the mediastinal contour stable. Minimal atelectasis is noted at the left lung base. No effusion or pneumothorax. No acute  osseous abnormality. Nonobstructive bowel-gas pattern. Multiple surgical clips are noted in the epigastric and left upper quadrant regions. No unusual calcification. Degenerative changes are present in the lumbar spine. IMPRESSION: 1. Multiple surgical clips in the epigastric and left upper quadrant region. 2. Mild atelectasis at the left lung base. 3. Cardiomegaly. 4. Nonobstructive bowel-gas pattern. Electronically Signed   By: Brett Fairy M.D.   On: 12/09/2021 04:27   DG Abd 1 View  Result Date: 12/09/2021 CLINICAL DATA:  Image discrete for metal prior to MRI. EXAM: CHEST  1 VIEW; ABDOMEN - 1 VIEW COMPARISON:  10/20/2019. FINDINGS: The heart is enlarged and the mediastinal contour stable. Minimal atelectasis is noted at the left lung base. No effusion or pneumothorax. No acute osseous abnormality. Nonobstructive bowel-gas pattern. Multiple surgical clips are noted in the epigastric and left upper quadrant regions. No unusual calcification. Degenerative changes are present in the lumbar spine. IMPRESSION: 1. Multiple surgical clips in the epigastric and left upper quadrant region. 2. Mild atelectasis at the left  lung base. 3. Cardiomegaly. 4. Nonobstructive bowel-gas pattern. Electronically Signed   By: Brett Fairy M.D.   On: 12/09/2021 04:27   CT Head Wo Contrast  Result Date: 12/08/2021 CLINICAL DATA:  Altered mental status, confusion EXAM: CT HEAD WITHOUT CONTRAST TECHNIQUE: Contiguous axial images were obtained from the base of the skull through the vertex without intravenous contrast. RADIATION DOSE REDUCTION: This exam was performed according to the departmental dose-optimization program which includes automated exposure control, adjustment of the mA and/or kV according to patient size and/or use of iterative reconstruction technique. COMPARISON:  CT head 05/22/2020 FINDINGS: Brain: There is no acute intracranial hemorrhage, extra-axial fluid collection, or acute infarct. Parenchymal volume is  within normal limits for age. The ventricles are normal in size. Patchy and confluent hypodensity throughout the subcortical and periventricular white matter likely reflects sequela of chronic white matter microangiopathy. There is no mass lesion.  There is no mass effect or midline shift. Vascular: The intracranial ICAs and posterior circulation are ectatic with the left supraclinoid ICA measuring up to 7 mm in the basilar artery measuring up to 7 mm, similar to the prior study. No dense vessel is seen. Skull: Normal. Negative for fracture or focal lesion. Sinuses/Orbits: The imaged paranasal sinuses are clear. The imaged globes and orbits are unremarkable. Other: None. IMPRESSION: 1. No acute intracranial pathology. Unchanged chronic white matter microangiopathy. 2. 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 Electronically Signed   By: Valetta Mole M.D.   On: 12/08/2021 19:33    Family Communication: None at bedside  Disposition: Status is: Inpatient Remains inpatient appropriate because: Unsafe DC, needs rehabilitation and needs stroke work up. Planned Discharge Destination:  CIR currently pursued.  Patrecia Pour, MD 12/09/2021 1:38 PM Page by [email or web address]

## 2021-12-09 NOTE — Consult Note (Signed)
Neurology Consultation Reason for Consult: Stroke Referring Physician: [place] Crane, C  CC: Right sided weakness.   History is obtained from:patient, chart  HPI: [name] is a 71 y.o. male with a history of hypertension, hyperlipidemia, CKD who presents with right-sided weakness and difficulty speaking that started sometime in the past 24 hours.  He is not able to give me a precise time, but his roommate last saw him around 7 PM on 7/3.  He presented this evening after the roommate returned home from work and found him to be confused.  In the emergency department head CT was read as negative, though in retrospect I think there is some blurring of the gray-white junction in the left parietal region   LKW: 7pm 7/3 tpa given?: no, outside of window] Thrombectomy?: no outside of 24 hour window  NIHSS score: 7 1A: Level of Consciousness - 0 1B: Ask Month and Age - 2 1C: 'Blink Eyes' & 'Squeeze Hands' - 0 2: Test Horizontal Extraocular Movements - 0 3: Test Visual Fields - 0 4: Test Facial Palsy - 0 5A: Test Left Arm Motor Drift - 0 5B: Test Right Arm Motor Drift - 0 6A: Test Left Leg Motor Drift - 0 6B: Test Right Leg Motor Drift - 2 7: Test Limb Ataxia - 0 8: Test Sensation - 1 9: Test Language/Aphasia- 2 10: Test Dysarthria - 0 11: Test Extinction/Inattention - 0   ROS: Unable to obtain due to altered mental status.   Past Medical History:  Diagnosis Date   Anemia    normal Fe, nl B12, nl retic, nl EPO July '13   Aortic regurgitation    Moderate AI 04/17/19 echo   Blood transfusion without reported diagnosis    BPH (benign prostatic hyperplasia)    Chronic back pain    Chronic kidney disease    CKD III, obstructive nephropathy   Diverticulosis    Dysuria    Elevated PSA, greater than or equal to 20 ng/ml June '13   PSA 107   Hemorrhoids, internal, with bleeding, prolapse 09/19/2014   Hyperlipidemia 05/29/2014   Hypertension    Obstructive uropathy 11/27/2015    Pre-diabetes    per patient "reduced sugar intake"   Renal insufficiency    Tuberculosis    h/o PPD +   UTI (urinary tract infection)    Vitamin D deficiency 09/13/2017     Family History  Problem Relation Age of Onset   Diabetes Mother    Stroke Brother    Hearing loss Brother    Colon cancer Neg Hx    Rectal cancer Neg Hx    Stomach cancer Neg Hx    Esophageal cancer Neg Hx      Social History:  reports that he quit smoking about 41 years ago. His smoking use included cigarettes. He has never used smokeless tobacco. He reports that he does not drink alcohol and does not use drugs.   Exam: Current vital signs: BP (!) 163/108 (BP Location: Right Arm)   Pulse 73   Temp 98.9 F (37.2 C) (Oral)   Resp 13   Wt 117.5 kg   SpO2 97%   BMI 34.18 kg/m  Vital signs in last 24 hours: Temp:  [98 F (36.7 C)-98.9 F (37.2 C)] 98.9 F (37.2 C) (07/05 0207) Pulse Rate:  [55-73] 73 (07/05 0207) Resp:  [12-23] 13 (07/05 0207) BP: (149-182)/(92-120) 163/108 (07/05 0207) SpO2:  [93 %-99 %] 97 % (07/05 0207) Weight:  [117.5 kg] 117.5 kg (  07/05 0223)   Physical Exam  Constitutional: Appears well-developed and well-nourished.  Psych: Affect appropriate to situation Eyes: No scleral injection HENT: No OP obstruction MSK: no joint deformities.  Cardiovascular: Normal rate and regular rhythm.  Respiratory: Effort normal, non-labored breathing GI: Soft.  No distension. There is no tenderness.  Skin: WDI  Neuro: Mental Status: Patient is awake, alert, he has repetitive, perseverative speech but is able to answer some simple questions at times, though not reliably(yes appears to be his default answer) Cranial Nerves: II: He endorses seeing fingers wiggling in both hemifield's. Pupils are equal, round, and reactive to light.   III,IV, VI: EOMI without ptosis or diploplia.  V: Facial sensation is symmetric to temperature VII: Facial movement is symmetric.  VIII: hearing is intact  to voice X: Uvula elevates symmetrically XI: Shoulder shrug is symmetric. XII: tongue is midline without atrophy or fasciculations.  Motor: He has significantly weak grip strength on the right, though he is able to hold the extremity aloft without drift, severe weakness of the right leg 3/5 Sensory: Sensation is diminished in the right Cerebellar: Does not perform     I have reviewed labs in epic and the results pertinent to this consultation are: Cr 1.94  I have reviewed the images obtained:MRI/MRA -M2 occlusion with infarct in the distribution of that vessel, T2 change essentially matching diffusion change  Impression: 71 year old male with a history of stroke risk factors including hypertension, hyperlipidemia who presents with right-sided weakness and aphasia consistent with his left MCA branch stroke.    Recommendations: - HgbA1c, fasting lipid panel - Frequent neuro checks - Echocardiogram - Prophylactic therapy-Antiplatelet med: Aspirin - dose '81mg'$  and plavix '75mg'$  daily   - Risk factor modification - Telemetry monitoring - PT consult, OT consult, Speech consult - Stroke team to follow    Roland Rack, MD [facility] [phone]  If 7pm- 7am, please page neurology on call as listed in [place].

## 2021-12-10 DIAGNOSIS — R0989 Other specified symptoms and signs involving the circulatory and respiratory systems: Secondary | ICD-10-CM | POA: Diagnosis not present

## 2021-12-10 MED ORDER — POTASSIUM CHLORIDE CRYS ER 20 MEQ PO TBCR: 40.0000 meq | EXTENDED_RELEASE_TABLET | Freq: Once | ORAL | Status: DC

## 2021-12-10 NOTE — Progress Notes (Signed)
STROKE TEAM PROGRESS NOTE   INTERVAL HISTORY No family at the bedside. Patient has shown some improvement in his aphasia and is now able to speak occasional short sentences and follows to mimic and occasional midline commands.  He has apparently no family and lives with a roommate.  Telemetry monitoring so far has not shown A-fib Vitals:   12/09/21 2341 12/10/21 0613 12/10/21 0722 12/10/21 1155  BP: (!) 163/115 131/66 (!) 144/102 (!) 159/95  Pulse: 81 81 75 73  Resp: '16  16 20  '$ Temp: 99.6 F (37.6 C) 99.1 F (37.3 C) 99.1 F (37.3 C) 99 F (37.2 C)  TempSrc: Oral Oral Oral Oral  SpO2: 98% 99% 97% 98%  Weight:       CBC:  Recent Labs  Lab 12/08/21 1841 12/08/21 1845 12/09/21 0507  WBC 6.4  --  5.9  NEUTROABS 1.8  --  2.5  HGB 15.4 15.6 15.6  HCT 45.5 46.0 47.0  MCV 87.7  --  86.4  PLT 282  --  179   Basic Metabolic Panel:  Recent Labs  Lab 12/08/21 1841 12/08/21 1845 12/09/21 0507  NA 141 143 144  K 3.0* 3.1* 3.0*  CL 103 101 104  CO2 29  --  27  GLUCOSE 98 93 82  BUN '20 18 15  '$ CREATININE 1.74* 2.30* 1.94*  CALCIUM 9.2  --  9.2  MG  --   --  1.8  PHOS  --   --  2.5   Lipid Panel:  Recent Labs  Lab 12/09/21 0507  CHOL 123  TRIG 36  HDL 48  CHOLHDL 2.6  VLDL 7  LDLCALC 68   HgbA1c:  Recent Labs  Lab 12/09/21 0507  HGBA1C 5.6   Urine Drug Screen:  Recent Labs  Lab 12/08/21 2022  LABOPIA NONE DETECTED  COCAINSCRNUR NONE DETECTED  LABBENZ NONE DETECTED  AMPHETMU NONE DETECTED  THCU NONE DETECTED  LABBARB NONE DETECTED    Alcohol Level  Recent Labs  Lab 12/08/21 1841  ETH <10    IMAGING past 24 hours No results found.  PHYSICAL EXAM  Physical Exam  Constitutional: Appears well-developed and well-nourished.  Elderly African male Cardiovascular: Normal rate and regular rhythm.  Respiratory: Effort normal, non-labored breathing  Neuro: Mental Status: Patient is awake, globally aphasia.  Speech is gibberish nonsensical words.  Not  able to follow 1 simple midline commands.  Does not mimic. Cranial Nerves: II: blinks to threat bilaterally III,IV, VI: EOMI without ptosis or diploplia.  V: Facial sensation is symmetric to temperature VII: Facial movement is symmetric resting and smiling VIII: Hearing is intact to voice X: Palate elevates symmetrically XI: Shoulder shrug is symmetric. XII: Tongue protrudes midline without atrophy or fasciculations.  Motor: Tone is normal. Bulk is normal. 5/5 strength was present in all four extremities.  Though prefers to move left side more Sensory: Sensation is symmetric to light touch and temperature in the arms and legs. No extinction to DSS present.  Deep Tendon Reflexes: 2+ and symmetric in the biceps and patellae.  Plantars: Toes are downgoing bilaterally.  Cerebellar: FNF and HKS are intact bilaterally   ASSESSMENT/PLAN [name] is a 71 y.o. male with history of  HTN, HLD, chronic HFpEF, mod AI, BPH, OAB, gout who presented to [facility] with dysphasia and right-sided weakness. He was found by his roommate who had not seen him since 7pm on 7/3. MRI showed a left M2 infarct.  Consider a loop recorder vs 30 day  monitor for long term cardiac monitoring. 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 Small vessel disease. MRI  Acute posterior left MCA territory infarct MRA  Positive for Left MCA posterior M2 branch poor flow or occlusion. 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 Distal Right ICA and Basilar arteries are up to 8 mm diameter. Evidence of superimposed intracranial atherosclerosis, including moderate bilateral distal ACA A2 segment stenosis. 2D Echo bubble study negative, EF 55-60% LDL 68 HgbA1c 5.6 VTE prophylaxis - lovenox    Diet   Diet Heart Room service  appropriate? Yes; Fluid consistency: Thin   aspirin 81 mg daily prior to admission, now on aspirin 81 mg daily and clopidogrel 75 mg daily.  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 MD to update note as needed.   Patient presented with aphasia due to left MCA branch infarct left MCA branch occlusion likely of embolic etiology.  Unfortunately remains severely aphasic.  No family available at the bedside for discussion.   Recommend 30-day heart monitoring to look for paroxysmal A-fib at discharge.  Continue aspirin and Plavix for 3 weeks followed by Plavix alone and aggressive risk factor modification.  Physical occupational and speech therapies.  Discussed with Dr.Grunz.  Stroke team will sign off.  Kindly call for questions greater   Antony Contras, MD Medical Director [place] Pager: [phone] 12/10/2021 12:14 PM   To contact Stroke Continuity provider, please refer to [email or web address] After hours, contact General Neurology's

## 2021-12-10 NOTE — Progress Notes (Signed)
IP rehab admissions - I spoke with patient's son, Legrand Como.  Son has not talked with family about who might be able to assist after discharge.  Patient lived with a roommate.  At this point, I would anticipate the need for SNF placement.  I did ask son to talk family today and let us know if plan will be different from SNF.  Call me for questions.  [phone]

## 2021-12-10 NOTE — Progress Notes (Signed)
Physical Therapy Treatment Patient Details Name: [name] MRN: [id number] DOB: 10-05-1950 Today's Date: 12/10/2021   History of Present Illness Pt is a 71 y/o male admitted secondary to rigth sided weakness and difficulty speaking. 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 PMH including but not limited to hypertension, hyperlipidemia, CKD.    PT Comments    Pt was seen for mobility on RW with initial balance efforts being unsafe and a challenge due to R hand having trouble releasing and grasping.  Pt finally was assisted to work on focus to [place] coordination and then could somewhat hold the R hand with HHA to walk on the unit.  Pt is unsafe and struggling to communicate, but more attentionally focused on movement after working on RUE to translate to gait control.  Pt is overall still not clear in his speech, and at times sounds as if he is speaking some [place].  Follow along with him as goals of acute PT are outlined in POC.   Recommendations for follow up therapy are one component of a multi-disciplinary discharge planning process, led by the attending physician.  Recommendations may be updated based on patient status, additional functional criteria and insurance authorization.  Follow Up Recommendations  Acute inpatient rehab (3hours/day)     Assistance Recommended at Discharge Frequent or constant Supervision/Assistance  Patient can return home with the following A lot of help with bathing/dressing/bathroom;A lot of help with walking and/or transfers;Assistance with cooking/housework;Direct supervision/assist for medications management;Direct supervision/assist for financial management;Assist for transportation;Help with stairs or ramp for entrance   Equipment Recommendations   (allow rehab venue to make the decisions)    Recommendations for Other Services Rehab consult     Precautions / Restrictions Precautions Precautions: Fall Precaution Comments: R  weakness Restrictions Weight Bearing Restrictions: No     Mobility  Bed Mobility Overal bed mobility: Needs Assistance Bed Mobility: Supine to Sit     Supine to sit: Min assist          Transfers Overall transfer level: Needs assistance Equipment used: 1 person hand held assist, Rolling walker (2 wheels) Transfers: Sit to/from Stand Sit to Stand: Min assist, From elevated surface           General transfer comment: min assist with higher surface and RW with cues, at times HHA    Ambulation/Gait Ambulation/Gait assistance: Min assist Gait Distance (Feet): 150 Feet Assistive device: 1 person hand held assist Gait Pattern/deviations: Step-through pattern, Decreased stride length, Wide base of support Gait velocity: reduced, variable   Pre-gait activities: pt is unsafe initially as he is trying to lean forward off a walker     Stairs             Wheelchair Mobility    Modified Rankin (Stroke Patients Only)       Balance Overall balance assessment: Needs assistance Sitting-balance support: Feet supported Sitting balance-Leahy Scale: Fair     Standing balance support: Bilateral upper extremity supported, During functional activity Standing balance-Leahy Scale: Poor                              Cognition Arousal/Alertness: Awake/alert Behavior During Therapy: Impulsive, Anxious (trying to be understood) Overall Cognitive Status: Impaired/Different from baseline Area of Impairment: Problem solving, Awareness, Safety/judgement, Following commands, Memory, Attention                     Memory: Decreased short-term  memory Following Commands: Follows one step commands with increased time Safety/Judgement: Decreased awareness of safety, Decreased awareness of deficits Awareness: Intellectual Problem Solving: Slow processing, Requires verbal cues, Requires tactile cues          Exercises Other Exercises Other Exercises:  coordination of RUE to alternate touch to face and head to external surface    General Comments General comments (skin integrity, edema, etc.): pt was unsafe to walk on RW and so used HHA, successful to control R lateral LOB      Pertinent Vitals/Pain Pain Assessment Pain Assessment: Faces Faces Pain Scale: No hurt    Home Living                          Prior Function            PT Goals (current goals can now be found in the care plan section) Acute Rehab PT Goals Patient Stated Goal: unable to state Progress towards PT goals: Progressing toward goals    Frequency    Min 4X/week      PT Plan Current plan remains appropriate    Co-evaluation              AM-PAC PT "6 Clicks" Mobility   Outcome Measure  Help needed turning from your back to your side while in a flat bed without using bedrails?: A Little Help needed moving from lying on your back to sitting on the side of a flat bed without using bedrails?: A Little Help needed moving to and from a bed to a chair (including a wheelchair)?: A Lot Help needed standing up from a chair using your arms (e.g., wheelchair or bedside chair)?: A Little Help needed to walk in hospital room?: A Lot Help needed climbing 3-5 steps with a railing? : Total 6 Click Score: 14    End of Session Equipment Utilized During Treatment: Gait belt Activity Tolerance: Patient tolerated treatment well Patient left: in chair;with call bell/phone within reach;with chair alarm set;Other (comment) Nurse Communication: Mobility status PT Visit Diagnosis: Other abnormalities of gait and mobility (R26.89)     Time: 2536-6440 PT Time Calculation (min) (ACUTE ONLY): 30 min  Charges:  $Gait Training: 8-22 mins $Therapeutic Activity: 8-22 mins   Ramond Dial 12/10/2021, 5:02 PM  Mee Hives, PT PhD Acute Rehab Dept. Number: [place]

## 2021-12-10 NOTE — Progress Notes (Signed)
Speech Language Pathology Treatment: Dysphagia;Cognitive-Linquistic  Patient Details Name: [name] MRN: [id number] DOB: 1950/11/27 Today's Date: 12/10/2021 Time: 1157-2620 SLP Time Calculation (min) (ACUTE ONLY): 13 min  Assessment / Plan / Recommendation Clinical Impression  Pt seen for aphasia and dysphagia treatment. His oropharyngeal swallow was within normal limits with regular/thin, no pocketing, effective mastication and transit. Coordinated pharyngeal swallow with thin liquids without s/s aspiration. No further ST needed for swallow but will continue for aphasia.  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 He could not name objects and continually speaking fluently with paraphasias and neologisms and unaware of his errors and that SLP was not understanding. Automatic speech tasks attempted without success. He did state "what is this" when pointing to meat on tray. He had the name "[name]" written piece of paper which he could not read. Will continue ST targeting language impairments.    HPI HPI: Pt is a 71 y/o male admitted secondary to rigth sided weakness and difficulty speaking. MRI of the brain revealed an M2 occlusion with acute posterior Left MCA territory infarct. PMH including but not limited to hypertension, hyperlipidemia, CKD.      SLP Plan  Continue with current plan of care;Other (Comment) (Sign off on swallow tx and continue aphasia tx)      Recommendations for follow up therapy are one component of a multi-disciplinary discharge planning process, led by the attending physician.  Recommendations may be updated based on patient status, additional functional criteria and insurance authorization.    Recommendations  Diet recommendations: Regular;Thin liquid Liquids provided via: Cup;Straw Medication Administration: Whole meds with  liquid Supervision: Staff to assist with self feeding Compensations: Minimize environmental distractions;Slow rate;Small sips/bites;Lingual sweep for clearance of pocketing Postural Changes and/or Swallow Maneuvers: Seated upright 90 degrees                Oral Care Recommendations: Oral care BID Follow Up Recommendations: Acute inpatient rehab (3hours/day) Assistance recommended at discharge: Frequent or constant Supervision/Assistance SLP Visit Diagnosis: Dysphagia, unspecified (R13.10);Aphasia (R47.01) Plan: Continue with current plan of care;Other (Comment) (Sign off on swallow tx and continue aphasia tx)           Houston Siren  12/10/2021, 3:02 PM

## 2021-12-10 NOTE — [place] FL2 (Signed)
[place] MEDICAID FL2 LEVEL OF CARE SCREENING TOOL     IDENTIFICATION  Patient Name: [name] Birthdate: 1950/06/28 Sex: male Admission Date (Current Location): 12/08/2021  [facility] and [place] Number:  Herbalist and Address:  [facility] [place], [place] [address] [place], [place] [address]      Provider Number: [id number]  Attending Physician Name and Address:  Patrecia Pour, MD  Relative Name and Phone Number:       Current Level of Care:   Recommended Level of Care:   Prior Approval Number:    Date Approved/Denied:   PASRR Number: [id number]  Discharge Plan: SNF    Current Diagnoses: Patient Active Problem List   Diagnosis Date Noted   Suspected cerebrovascular accident (CVA) 12/08/2021   Aortic insufficiency 11/30/2021   Right bundle branch block 11/30/2021   Olecranon bursitis, right elbow 06/13/2021   Rupture of UCL of left thumb 03/18/2021   Aortic atherosclerosis ([place]) 12/10/2020   Chronic pain of right knee 12/10/2020   Bilateral elbow joint pain 07/09/2020   Acute renal failure superimposed on chronic kidney disease ([place]) 11/12/2019   Dry eyes 11/12/2019   S/P hernia repair 10/17/2019   Abdominal pain 07/06/2019   BPH with obstruction/lower urinary tract symptoms 03/09/2019   Clot retention of urine 02/11/2019   Vitamin D deficiency 09/13/2017   Urinary tract infection without hematuria 07/23/2017   Low back pain 07/03/2017   Dizziness 06/30/2017   Acute gouty arthritis 12/01/2016   Degenerative arthritis of knee, bilateral 11/18/2016   Urinary retention due to benign prostatic hyperplasia 10/24/2016   Mass of right side of neck 10/20/2016   Gout 10/20/2016   Right knee pain 10/12/2016   Hypokalemia 05/28/2016   Encounter for well adult exam with abnormal findings 11/27/2015   Obstructive uropathy 11/27/2015   Elevated PSA 11/27/2015   Acute pyelonephritis 05/27/2015   Generalized bloating 05/27/2015   Chronic  constipation 05/27/2015   Chest pain 05/27/2015   AKI (acute kidney injury) ([place]) 05/27/2015   Acute sinus infection 11/22/2014   Cough 09/25/2014   Hemorrhoids, internal, with bleeding, prolapse 09/19/2014   Chronic UTI 05/29/2014   Hyperlipidemia 05/29/2014   Lumbar radiculopathy 05/23/2014   Lumbar stenosis 11/20/2013   Abnormal glucose 11/06/2013   Unspecified hereditary and idiopathic peripheral neuropathy 01/22/2013   Cardiomyopathy due to hypertension ([place]) 12/28/2011   CKD (chronic kidney disease) stage 3, GFR 30-59 ml/min (HCC) 11/24/2011   Hematuria 11/24/2011   Hydronephrosis 11/24/2011   Anemia 11/24/2011   BRADYCARDIA 02/05/2010   TINEA PEDIS 01/29/2010   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   PERIPHERAL EDEMA 01/29/2010   ABNORMAL ELECTROCARDIOGRAM 01/29/2010   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   Osteoarthrosis, generalized, 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   ONYCHOMYCOSIS, TOENAILS 10/02/2007   BPH (benign prostatic hyperplasia) 10/02/2007   Essential hypertension 03/06/2007    Orientation RESPIRATION BLADDER Height & Weight     Self  Normal Continent Weight: 117.5 kg Height:     BEHAVIORAL SYMPTOMS/MOOD NEUROLOGICAL BOWEL NUTRITION STATUS      Continent Diet (heart healthy with thin liquids)  AMBULATORY STATUS COMMUNICATION OF NEEDS Skin   Extensive Assist Verbally Normal                       Personal Care Assistance Level of Assistance  Bathing, Feeding, Dressing Bathing Assistance: Maximum assistance Feeding assistance: Limited assistance Dressing Assistance: Limited assistance     Functional Limitations Info  Sight, Hearing, Speech Sight  Info: Impaired Hearing Info: Adequate Speech Info: Impaired (aphasia)    SPECIAL CARE FACTORS FREQUENCY  PT (By licensed PT), OT (By licensed OT), Speech therapy     PT Frequency: 5x/wk OT Frequency: 5x/wk     Speech Therapy Frequency: 5x/wk      Contractures Contractures Info: Not present     Additional Factors Info  Code Status, Allergies Code Status Info: Full Allergies Info: NKA           Current Medications (12/10/2021):  This is the current hospital active medication list Current Facility-Administered Medications  Medication Dose Route Frequency Provider Last Rate Last Admin   acetaminophen (TYLENOL) tablet 650 mg  650 mg Oral Q6H PRN Kayleen Memos, DO       aspirin EC tablet 81 mg  81 mg Oral Daily Kayleen Memos, DO   81 mg at 12/10/21 0955   atorvastatin (LIPITOR) tablet 80 mg  80 mg Oral Daily Kayleen Memos, DO   80 mg at 12/10/21 8413   clopidogrel (PLAVIX) tablet 75 mg  75 mg Oral Daily Kayleen Memos, DO   75 mg at 12/10/21 0955   enoxaparin (LOVENOX) injection 40 mg  40 mg Subcutaneous Q24H Irene Pap N, DO   40 mg at 12/09/21 2227   HYDROmorphone (DILAUDID) injection 0.5 mg  0.5 mg Intravenous Q4H PRN Irene Pap N, DO       labetalol (NORMODYNE) injection 5 mg  5 mg Intravenous Q2H PRN Hall, Carole N, DO       melatonin tablet 5 mg  5 mg Oral QHS PRN Irene Pap N, DO       oxyCODONE (Oxy IR/ROXICODONE) immediate release tablet 5 mg  5 mg Oral Q6H PRN Irene Pap N, DO       polyethylene glycol (MIRALAX / GLYCOLAX) packet 17 g  17 g Oral Daily PRN Irene Pap N, DO       prochlorperazine (COMPAZINE) injection 10 mg  10 mg Intravenous Q6H PRN Kayleen Memos, DO         Discharge Medications: Please see discharge summary for a list of discharge medications.  Relevant Imaging Results:  Relevant Lab Results:   Additional Information SS#: [id number]  Pollie Friar, RN

## 2021-12-10 NOTE — Consult Note (Addendum)
   [facility] CM Inpatient Consult   12/10/2021  [name] 06-25-1950 [id number]  [place] [facility] [ACO] Patient: [employer] Medicare  Primary Care Provider:  Biagio Borg, MD, with [place] at [facility] is an embedded provider with a Chronic Care Management team and program, and is listed for the transition of care follow up and appointments.  12/11/21  2:19 Asleep on rounds, chart review for ongoing needs. Reviewed updated rehab Coord notes, patient for SNF at this time.  Patient has been active for Embedded practice service needs for chronic care management until completing program on 12/02/2021 for Hypertension. EMR reviewed for admission. Patient is currently being recommended for a higher level of care for post hospital needs SNF verses CIR.  Plan: Will follow for progress and post hospital needs. Another referral to restart Care Coordination for  [facility] needs for post hospital care can be determined.  Please contact for further questions,  Natividad Brood, RN BSN [facility] Liaison  [phone] business mobile phone Toll free office [phone]  Fax number: [phone] [email or web address]

## 2021-12-10 NOTE — Progress Notes (Signed)
Progress Note  Patient: [name] GXQ:[id number] DOB: [birth year]  DOA: 12/08/2021  DOS: 12/10/2021    Brief hospital course: [name] is a 71 y.o. male with a history of HTN, HLD, chronic HFpEF, mod AI, BPH, OAB, gout who presented to [facility] with dysphasia and right-sided weakness. CT head was nonacutre, though he was admitted to [facility] for MRI which showed M2 branch occlusion with infarct. Neurology is consulted, DAPT initiated, and work up is underway. CIR disposition is being sought.   Assessment and Plan: Acute left MCA branch (M2) CVA: With resultant significant expressive aphasia and some right-sided weakness. No CES or shunt on echo with preserved LVEF. - Neurology consulted, discussed with Dr. Leonie Man.  - Continue cardiac telemetry. Will anticipate 30-day cardiac monitoring at discharge with high suspicion for embolic etiology.  - Continue ASA, plavix - Continue high-intensity statin. LDL is 68. HbA1c 5.6%.  - PT/OT/SLP > pursuing SNF vs. CIR  Aortic root dilatation: 63m by echo. Also 341mascending thoracic aorta dilatation.  - Surveillance recommended.   HTN:  - Permissive HTN for now. Pharmacy unable to confirm home meds. Will initiate medications over next 24 hours to bring to goal normotensive.  HLD:  - Continue atorvastatin. LDL is at goal at 68.   Chronic HFpEF, moderate AI:  - Echo being updated. Appears compensated currently.   Gout: Chronic, quiescent.  - Monitor clinically.   OAB:  - Continue myrbetriq unless BP becomes too severely elevated. Caution advised with concomitant BPH. Monitoring UOP.   Chronic right knee pain, usually walks with cane. Will monitor.   Hypokalemia:  - Supplement  Hyperbilirubinemia: Monitor in AM to decide on further work up. No evidence of hemolysis or other LFT abnormalities, ?Gilbert's.   Obesity: Estimated body mass index is 34.18 kg/m as calculated from the following:   Height as of 11/30/21: '6\' 1"'$  (1.854 m).   Weight as of  this encounter: 117.5 kg.  Subjective: Aphasia improved, able to get some points across and answer by nodding/shaking head. Moving right side a bit better. NAEON  Objective: Vitals:   12/10/21 0613 12/10/21 0722 12/10/21 1155 12/10/21 1614  BP: 131/66 (!) 144/102 (!) 159/95 (!) 152/93  Pulse: 81 75 73 75  Resp:  '16 20 19  '$ Temp: 99.1 F (37.3 C) 99.1 F (37.3 C) 99 F (37.2 C) 98.6 F (37 C)  TempSrc: Oral Oral Oral Oral  SpO2: 99% 97% 98% 97%  Weight:       Gen: [age].o. male in no distress Pulm: Nonlabored breathing room air. Clear. CV: Regular rate and rhythm. No murmur, rub, or gallop. No JVD, no dependent edema. GI: Abdomen soft, non-tender, non-distended, with normoactive bowel sounds.  Ext: Warm, no deformities Skin: No rashes, lesions or ulcers on visualized skin. Neuro: Alert, aphasic with right hemiparesis what is improved, no new focal neurological deficits. Psych: Judgement and insight appear fair. Mood euthymic & affect congruent. Behavior is appropriate.    Data Personally reviewed: CBC: Recent Labs  Lab 12/08/21 1841 12/08/21 1845 12/09/21 0507  WBC 6.4  --  5.9  NEUTROABS 1.8  --  2.5  HGB 15.4 15.6 15.6  HCT 45.5 46.0 47.0  MCV 87.7  --  86.4  PLT 282  --  27144 Basic Metabolic Panel: Recent Labs  Lab 12/08/21 1841 12/08/21 1845 12/09/21 0507  NA 141 143 144  K 3.0* 3.1* 3.0*  CL 103 101 104  CO2 29  --  27  GLUCOSE 98  93 82  BUN '20 18 15  '$ CREATININE 1.74* 2.30* 1.94*  CALCIUM 9.2  --  9.2  MG  --   --  1.8  PHOS  --   --  2.5   GFR: Estimated Creatinine Clearance: 46.9 mL/min (A) (by C-G formula based on SCr of 1.94 mg/dL (H)). Liver Function Tests: Recent Labs  Lab 12/08/21 1841 12/09/21 0507  AST 19 19  ALT 15 15  ALKPHOS 100 90  BILITOT 1.0 1.3*  PROT 7.8 7.2  ALBUMIN 4.1 3.5   No results for input(s): "LIPASE", "AMYLASE" in the last 168 hours. Recent Labs  Lab 12/08/21 1844  AMMONIA <10   Coagulation Profile: Recent  Labs  Lab 12/08/21 1841  INR 1.0   Cardiac Enzymes: No results for input(s): "CKTOTAL", "CKMB", "CKMBINDEX", "TROPONINI" in the last 168 hours. BNP (last 3 results) No results for input(s): "PROBNP" in the last 8760 hours. HbA1C: Recent Labs    12/09/21 0507  HGBA1C 5.6   CBG: Recent Labs  Lab 12/08/21 1833  GLUCAP 83   Lipid Profile: Recent Labs    12/09/21 0507  CHOL 123  HDL 48  LDLCALC 68  TRIG 36  CHOLHDL 2.6   Thyroid Function Tests: No results for input(s): "TSH", "T4TOTAL", "FREET4", "T3FREE", "THYROIDAB" in the last 72 hours. Anemia Panel: No results for input(s): "VITAMINB12", "FOLATE", "FERRITIN", "TIBC", "IRON", "RETICCTPCT" in the last 72 hours. Urine analysis:    Component Value Date/Time   COLORURINE STRAW (A) 12/08/2021 2022   APPEARANCEUR CLEAR 12/08/2021 2022   LABSPEC 1.005 12/08/2021 2022   PHURINE 7.0 12/08/2021 2022   GLUCOSEU NEGATIVE 12/08/2021 2022   GLUCOSEU NEGATIVE 06/12/2021 1510   HGBUR SMALL (A) 12/08/2021 2022   HGBUR negative 01/29/2010 0000   BILIRUBINUR NEGATIVE 12/08/2021 2022   BILIRUBINUR neg 11/26/2017 1304   KETONESUR NEGATIVE 12/08/2021 2022   PROTEINUR NEGATIVE 12/08/2021 2022   UROBILINOGEN 0.2 06/12/2021 1510   NITRITE NEGATIVE 12/08/2021 2022   LEUKOCYTESUR NEGATIVE 12/08/2021 2022   Recent Results (from the past 240 hour(s))  Resp Panel by RT-PCR (Flu A&B, Covid) Anterior Nasal Swab     Status: None   Collection Time: 12/08/21  6:41 PM   Specimen: Anterior Nasal Swab  Result Value Ref Range Status   SARS Coronavirus 2 by RT PCR NEGATIVE NEGATIVE Final    Comment: (NOTE) SARS-CoV-2 target nucleic acids are NOT DETECTED.  The SARS-CoV-2 RNA is generally detectable in upper respiratory specimens during the acute phase of infection. The lowest concentration of SARS-CoV-2 viral copies this assay can detect is 138 copies/mL. A negative result does not preclude SARS-Cov-2 infection and should not be used as the  sole basis for treatment or other patient management decisions. A negative result may occur with  improper specimen collection/handling, submission of specimen other than nasopharyngeal swab, presence of viral mutation(s) within the areas targeted by this assay, and inadequate number of viral copies(<138 copies/mL). A negative result must be combined with clinical observations, patient history, and epidemiological information. The expected result is Negative.  Fact Sheet for Patients:  EntrepreneurPulse.com.au  Fact Sheet for Healthcare Providers:  IncredibleEmployment.be  This test is no t yet approved or cleared by the [place] FDA and  has been authorized for detection and/or diagnosis of SARS-CoV-2 by FDA under an Emergency Use Authorization (EUA). This EUA will remain  in effect (meaning this test can be used) for the duration of the COVID-19 declaration under Section 564(b)(1) of the Act, 21 U.S.C.section 360bbb-3(b)(1),  unless the authorization is terminated  or revoked sooner.       Influenza A by PCR NEGATIVE NEGATIVE Final   Influenza B by PCR NEGATIVE NEGATIVE Final    Comment: (NOTE) The Xpert Xpress SARS-CoV-2/FLU/RSV plus assay is intended as an aid in the diagnosis of influenza from Nasopharyngeal swab specimens and should not be used as a sole basis for treatment. Nasal washings and aspirates are unacceptable for Xpert Xpress SARS-CoV-2/FLU/RSV testing.  Fact Sheet for Patients: EntrepreneurPulse.com.au  Fact Sheet for Healthcare Providers: IncredibleEmployment.be  This test is not yet approved or cleared by the [place] FDA and has been authorized for detection and/or diagnosis of SARS-CoV-2 by FDA under an Emergency Use Authorization (EUA). This EUA will remain in effect (meaning this test can be used) for the duration of the COVID-19 declaration under Section 564(b)(1) of the  Act, 21 U.S.C. section 360bbb-3(b)(1), unless the authorization is terminated or revoked.  Performed at [facility], [place] [address] [place], [place] [address]      ECHOCARDIOGRAM COMPLETE BUBBLE STUDY  Result Date: 12/09/2021    ECHOCARDIOGRAM REPORT   Patient Name:   [name] Date of Exam: 12/09/2021 Medical Rec #:  [id number]    Height:       73.0 in Accession #:    [id number]   Weight:       259.0 lb Date of Birth:  1950/08/19    BSA:          2.402 m Patient Age:    81 years     BP:           160/91 mmHg Patient Gender: M            HR:           83 bpm. Exam Location:  Inpatient Procedure: 2D Echo, Cardiac Doppler, Color Doppler and Saline Contrast Bubble            Study Indications:    Stroke  History:        Patient has prior history of Echocardiogram examinations. Risk                 Factors:Hypertension.  Sonographer:    Jyl Heinz Referring Phys: [id number] [place]  1. Left ventricular ejection fraction, by estimation, is 55 to 60%. The left ventricle has normal function. The left ventricle has no regional wall motion abnormalities. There is mild concentric left ventricular hypertrophy. Left ventricular diastolic parameters are indeterminate.  2. Right ventricular systolic function is normal. The right ventricular size is normal.  3. The mitral valve is normal in structure. No evidence of mitral valve regurgitation. No evidence of mitral stenosis.  4. The aortic valve is tricuspid. There is mild thickening of the aortic valve. Aortic valve regurgitation is mild to moderate. Aortic valve sclerosis/calcification is present, without any evidence of aortic stenosis.  5. There is mild dilatation of the ascending aorta, measuring 38 mm. There is moderate dilatation of the aortic root, measuring 41 mm.  6. The inferior vena cava is normal in size with greater than 50% respiratory variability, suggesting right atrial pressure of 3 mmHg. FINDINGS  Left Ventricle:  Left ventricular ejection fraction, by estimation, is 55 to 60%. The left ventricle has normal function. The left ventricle has no regional wall motion abnormalities. The left ventricular internal cavity size was normal in size. There is  mild concentric left ventricular hypertrophy. Left ventricular diastolic parameters are indeterminate. Right Ventricle: The  right ventricular size is normal. No increase in right ventricular wall thickness. Right ventricular systolic function is normal. Left Atrium: Left atrial size was normal in size. Right Atrium: Right atrial size was normal in size. Pericardium: There is no evidence of pericardial effusion. Mitral Valve: The mitral valve is normal in structure. No evidence of mitral valve regurgitation. No evidence of mitral valve stenosis. Tricuspid Valve: The tricuspid valve is normal in structure. Tricuspid valve regurgitation is not demonstrated. No evidence of tricuspid stenosis. Aortic Valve: The aortic valve is tricuspid. There is mild thickening of the aortic valve. Aortic valve regurgitation is mild to moderate. Aortic regurgitation PHT measures 452 msec. Aortic valve sclerosis/calcification is present, without any evidence of aortic stenosis. Aortic valve peak gradient measures 8.6 mmHg. Pulmonic Valve: The pulmonic valve was normal in structure. Pulmonic valve regurgitation is not visualized. No evidence of pulmonic stenosis. Aorta: There is mild dilatation of the ascending aorta, measuring 38 mm. There is moderate dilatation of the aortic root, measuring 41 mm. Venous: The inferior vena cava is normal in size with greater than 50% respiratory variability, suggesting right atrial pressure of 3 mmHg. IAS/Shunts: No atrial level shunt detected by color flow Doppler. Agitated saline contrast was given intravenously to evaluate for intracardiac shunting.  LEFT VENTRICLE PLAX 2D LVIDd:         5.40 cm      Diastology LVIDs:         3.80 cm      LV e' medial:    7.46 cm/s  LV PW:         1.10 cm      LV E/e' medial:  10.3 LV IVS:        1.30 cm      LV e' lateral:   7.77 cm/s LVOT diam:     2.30 cm      LV E/e' lateral: 9.9 LV SV:         101 LV SV Index:   42 LVOT Area:     4.15 cm  LV Volumes (MOD) LV vol d, MOD A2C: 115.0 ml LV vol d, MOD A4C: 144.0 ml LV vol s, MOD A2C: 48.6 ml LV vol s, MOD A4C: 60.4 ml LV SV MOD A2C:     66.4 ml LV SV MOD A4C:     144.0 ml LV SV MOD BP:      74.3 ml RIGHT VENTRICLE             IVC RV Basal diam:  3.30 cm     IVC diam: 1.80 cm RV S prime:     10.30 cm/s TAPSE (M-mode): 1.6 cm LEFT ATRIUM             Index        RIGHT ATRIUM           Index LA diam:        4.60 cm 1.92 cm/m   RA Area:     15.20 cm LA Vol (A2C):   67.3 ml 28.02 ml/m  RA Volume:   34.20 ml  14.24 ml/m LA Vol (A4C):   73.4 ml 30.56 ml/m LA Biplane Vol: 71.4 ml 29.73 ml/m  AORTIC VALVE AV Area (Vmax): 3.87 cm AV Vmax:        147.00 cm/s AV Peak Grad:   8.6 mmHg LVOT Vmax:      137.00 cm/s LVOT Vmean:     102.000 cm/s LVOT VTI:  0.242 m AI PHT:         452 msec  AORTA Ao Root diam: 4.10 cm Ao Asc diam:  3.80 cm MITRAL VALVE MV Area (PHT): 7.82 cm    SHUNTS MV Decel Time: 97 msec     Systemic VTI:  0.24 m MV E velocity: 76.60 cm/s  Systemic Diam: 2.30 cm MV A velocity: 57.20 cm/s MV E/A ratio:  1.34 Kardie Tobb DO Electronically signed by Berniece Salines DO Signature Date/Time: 12/09/2021/1:02:52 PM    Final    MR ANGIO HEAD WO CONTRAST  Result Date: 12/09/2021 CLINICAL DATA:  71 year old male with confusion, right side weakness. Left MCA infarct on MRI. EXAM: MRA HEAD WITHOUT CONTRAST TECHNIQUE: Angiographic images of the Circle of Willis were acquired using MRA technique without intravenous contrast. COMPARISON:  Brain MRI and neck MRA today reported separately. FINDINGS: Anterior circulation: Antegrade flow in dolichoectatic ICA siphons. Infundibulum of the supraclinoid right ICA on series 5, image 112. The vessel measures up to 8 mm diameter. 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 Siphon irregularity in keeping with some atherosclerosis, but no significant siphon stenosis. Patent carotid termini, MCA and ACA origins. Tortuous and ectatic A1 segments measure up to 4 mm diameter. Diminutive anterior communicating artery. There is moderate distal ACA A2 segment stenosis on series 1038, image 1. Right MCA M1 segment bifurcates without stenosis. Right MCA branches are attack and mildly irregular. Left MCA M1 segment bifurcates early without stenosis. There is moderate to severe attenuation of a posterior left MCA M2 branch on series 1044, image 1, probably series 5, image 140 of the source images. That branch may be occluded throughout. Other left MCA branches appear patent with mild irregularity. Posterior circulation: Antegrade flow in the posterior circulation with dolichoectatic vertebrobasilar system. Distal right vertebral artery is 6 mm diameter. Basilar artery is up to 8 mm diameter. Ectatic basilar tip but no discrete aneurysm. No stenosis. SCA and PCA origins are patent. Posterior communicating arteries are diminutive or absent. Bilateral PCA branches appear within normal limits. Anatomic variants: None significant. Other: Brain MRI today reported separately. IMPRESSION: 1. Positive for Left MCA posterior M2 branch poor flow or occlusion. 2. 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 Distal Right ICA and Basilar arteries are up to 8 mm diameter. 3. Evidence of superimposed intracranial atherosclerosis, including moderate bilateral distal ACA A2 segment stenosis. Electronically Signed   By: Genevie Ann M.D.   On: 12/09/2021 05:30   MR ANGIO NECK WO CONTRAST  Result Date: 12/09/2021 CLINICAL DATA:  71 year old male with confusion, right side weakness. Left MCA infarct on MRI. EXAM: MRA NECK WITHOUT CONTRAST  TECHNIQUE: Angiographic images of the neck were acquired using MRA technique without intravenous contrast. Carotid stenosis measurements (when applicable) are obtained utilizing NASCET criteria, using the distal internal carotid diameter as the denominator. COMPARISON:  Brain MRI today reported separately. Neck CT 04/27/2018. FINDINGS: Aortic arch: Not well evaluated. Right carotid system: Antegrade flow. Visible right CCA is patent and mildly tortuous. Right carotid bifurcation appears capacious. Mildly tortuous cervical right ICA, no stenosis identified. Left carotid system: Antegrade Flow. Similar tortuosity. No stenosis identified. Vertebral arteries: Fairly codominant and tortuous bilateral cervical vertebral arteries with antegrade flow signal to the skull base. No stenosis identified. 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 IMPRESSION: 1. Patent cervical  carotid and vertebral arteries with tortuosity. No stenosis identified. 2. 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 Electronically Signed   By: Genevie Ann M.D.   On: 12/09/2021 05:17   MR BRAIN WO CONTRAST  Result Date: 12/09/2021 CLINICAL DATA:  71 year old male with confusion, right side weakness. EXAM: MRI HEAD WITHOUT CONTRAST TECHNIQUE: Multiplanar, multiecho pulse sequences of the brain and surrounding structures were obtained without intravenous contrast. COMPARISON:  Plain head CT 12/08/2021. FINDINGS: Brain: Confluent roughly 6 cm area of restricted diffusion in the posterior left MCA territory including lateral perirolandic cortex, post centri parietal lobe and the superior sensory strip. Ischemia also tracks into the posterior left insula. See series 7, image 56). Early cytotoxic edema. No convincing acute hemorrhage. No other vascular territory ischemia. Patchy and confluent bilateral cerebral white matter T2 and FLAIR hyperintensity. Occasional chronic micro hemorrhages in the bilateral deep  gray nuclei, white matter, right cerebellum. Brainstem and cerebellum are least affected. No cortical encephalomalacia is identified. No midline shift, mass effect, evidence of mass lesion, ventriculomegaly, extra-axial collection or acute intracranial hemorrhage. Cervicomedullary junction and pituitary are within normal limits. 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 See MRA today reported separately. Skull and upper cervical spine: Visualized bone marrow signal is within normal limits. Some degenerative changes in the visible cervical spine. Sinuses/Orbits: Mildly Disconjugate gaze. Right maxillary mucous retention cyst but other paranasal Visualized paranasal sinuses and mastoids are stable and well aerated. Other: Grossly normal visible internal auditory structures. Negative visible scalp and face. IMPRESSION: 1. Acute posterior Left MCA territory infarct. No associated hemorrhage or mass effect. 2. 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 See MRA today reported separately. 3. 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 Electronically Signed   By: Genevie Ann M.D.   On: 12/09/2021 05:14   DG Pelvis 1-2 Views  Result Date: 12/09/2021 CLINICAL DATA:  Encounter for imaging to screen for metal prior to MRI. EXAM: PELVIS - 1-2 VIEW COMPARISON:  None Available. FINDINGS: There is no evidence of pelvic fracture or diastasis. Mild-to-moderate degenerative changes are noted at the hips bilaterally. No radiopaque foreign body. IMPRESSION: No radiopaque foreign body. Electronically Signed   By: Brett Fairy M.D.   On: 12/09/2021 04:31   DG Chest 1 View  Result Date: 12/09/2021 CLINICAL DATA:  Image discrete for metal prior to MRI. EXAM: CHEST  1 VIEW; ABDOMEN - 1 VIEW COMPARISON:  10/20/2019.  FINDINGS: The heart is enlarged and the mediastinal contour stable. Minimal atelectasis is noted at the left lung base. No effusion or pneumothorax. No acute osseous abnormality. Nonobstructive bowel-gas pattern. Multiple surgical clips are noted in the epigastric and left upper quadrant regions. No unusual calcification. Degenerative changes are present in the lumbar spine. IMPRESSION: 1. Multiple surgical clips in the epigastric and left upper quadrant region. 2. Mild atelectasis at the left lung base. 3. Cardiomegaly. 4. Nonobstructive bowel-gas pattern. Electronically Signed   By: Brett Fairy M.D.   On: 12/09/2021 04:27   DG Abd 1 View  Result Date: 12/09/2021 CLINICAL DATA:  Image discrete for metal prior to MRI. EXAM: CHEST  1 VIEW; ABDOMEN - 1 VIEW COMPARISON:  10/20/2019. FINDINGS: The heart is enlarged and the mediastinal contour stable. Minimal atelectasis is noted at the left lung base. No effusion or pneumothorax. No acute osseous abnormality. Nonobstructive bowel-gas pattern. Multiple surgical clips are noted  in the epigastric and left upper quadrant regions. No unusual calcification. Degenerative changes are present in the lumbar spine. IMPRESSION: 1. Multiple surgical clips in the epigastric and left upper quadrant region. 2. Mild atelectasis at the left lung base. 3. Cardiomegaly. 4. Nonobstructive bowel-gas pattern. Electronically Signed   By: Brett Fairy M.D.   On: 12/09/2021 04:27   CT Head Wo Contrast  Result Date: 12/08/2021 CLINICAL DATA:  Altered mental status, confusion EXAM: CT HEAD WITHOUT CONTRAST TECHNIQUE: Contiguous axial images were obtained from the base of the skull through the vertex without intravenous contrast. RADIATION DOSE REDUCTION: This exam was performed according to the departmental dose-optimization program which includes automated exposure control, adjustment of the mA and/or kV according to patient size and/or use of iterative reconstruction technique.  COMPARISON:  CT head 05/22/2020 FINDINGS: Brain: There is no acute intracranial hemorrhage, extra-axial fluid collection, or acute infarct. Parenchymal volume is within normal limits for age. The ventricles are normal in size. Patchy and confluent hypodensity throughout the subcortical and periventricular white matter likely reflects sequela of chronic white matter microangiopathy. There is no mass lesion.  There is no mass effect or midline shift. Vascular: The intracranial ICAs and posterior circulation are ectatic with the left supraclinoid ICA measuring up to 7 mm in the basilar artery measuring up to 7 mm, similar to the prior study. No dense vessel is seen. Skull: Normal. Negative for fracture or focal lesion. Sinuses/Orbits: The imaged paranasal sinuses are clear. The imaged globes and orbits are unremarkable. Other: None. IMPRESSION: 1. No acute intracranial pathology. Unchanged chronic white matter microangiopathy. 2. 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 Electronically Signed   By: Valetta Mole M.D.   On: 12/08/2021 19:33    Family Communication: None at bedside  Disposition: Status is: Inpatient Remains inpatient appropriate because: Unsafe DC, needs rehabilitation  Planned Discharge Destination: SNF most likely unless family can provide 24/7 care post CIR discharge.  Patrecia Pour, MD 12/10/2021 6:49 PM Page by [email or web address]

## 2021-12-11 ENCOUNTER — Other Ambulatory Visit: Payer: Self-pay | Admitting: Internal Medicine

## 2021-12-11 ENCOUNTER — Ambulatory Visit: Payer: Medicare Other | Admitting: Internal Medicine

## 2021-12-11 DIAGNOSIS — R0989 Other specified symptoms and signs involving the circulatory and respiratory systems: Secondary | ICD-10-CM | POA: Diagnosis not present

## 2021-12-11 LAB — COMPREHENSIVE METABOLIC PANEL
ALT: 11 U/L (ref 0–44)
AST: 20 U/L (ref 15–41)
Albumin: 3.5 g/dL (ref 3.5–5.0)
Alkaline Phosphatase: 81 U/L (ref 38–126)
Anion gap: 12 (ref 5–15)
BUN: 18 mg/dL (ref 8–23)
CO2: 26 mmol/L (ref 22–32)
Calcium: 9.1 mg/dL (ref 8.9–10.3)
Chloride: 104 mmol/L (ref 98–111)
Creatinine, Ser: 2.2 mg/dL — ABNORMAL HIGH (ref 0.61–1.24)
GFR, Estimated: 31 mL/min — ABNORMAL LOW (ref >60)
Glucose, Bld: 103 mg/dL — ABNORMAL HIGH (ref 70–99)
Potassium: 3.3 mmol/L — ABNORMAL LOW (ref 3.5–5.1)
Sodium: 142 mmol/L (ref 135–145)
Total Bilirubin: 1.5 mg/dL — ABNORMAL HIGH (ref 0.3–1.2)
Total Protein: 6.7 g/dL (ref 6.5–8.1)

## 2021-12-11 MED ORDER — METHYLPREDNISOLONE SODIUM SUCC 125 MG IJ SOLR
125.0000 mg | Freq: Once | INTRAMUSCULAR | Status: AC
  Administered 2021-12-11: 125 mg via INTRAVENOUS
  Filled 2021-12-11: qty 2

## 2021-12-11 MED ORDER — METOPROLOL TARTRATE 50 MG PO TABS
50.0000 mg | ORAL_TABLET | Freq: Two times a day (BID) | ORAL | Status: DC
  Administered 2021-12-11 – 2021-12-12 (×2): 50 mg via ORAL
  Filled 2021-12-11 (×2): qty 1

## 2021-12-11 MED ORDER — PREDNISONE 20 MG PO TABS
40.0000 mg | ORAL_TABLET | Freq: Every day | ORAL | Status: DC
  Administered 2021-12-12: 40 mg via ORAL
  Filled 2021-12-11: qty 2

## 2021-12-11 MED ORDER — POTASSIUM CHLORIDE CRYS ER 20 MEQ PO TBCR
40.0000 meq | EXTENDED_RELEASE_TABLET | Freq: Once | ORAL | Status: AC
  Administered 2021-12-11: 40 meq via ORAL
  Filled 2021-12-11: qty 2

## 2021-12-11 MED ORDER — AMLODIPINE BESYLATE 10 MG PO TABS
10.0000 mg | ORAL_TABLET | Freq: Every day | ORAL | Status: DC
  Administered 2021-12-11 – 2021-12-12 (×2): 10 mg via ORAL
  Filled 2021-12-11 (×2): qty 1

## 2021-12-11 MED ORDER — ALLOPURINOL 100 MG PO TABS
200.0000 mg | ORAL_TABLET | Freq: Every day | ORAL | Status: DC
  Administered 2021-12-11 – 2021-12-12 (×2): 200 mg via ORAL
  Filled 2021-12-11 (×2): qty 2

## 2021-12-11 MED ORDER — COLCHICINE 0.6 MG PO TABS
0.6000 mg | ORAL_TABLET | Freq: Every day | ORAL | Status: DC
Start: 2021-12-11 — End: 2021-12-12
  Administered 2021-12-11 – 2021-12-12 (×2): 0.6 mg via ORAL
  Filled 2021-12-11 (×2): qty 1

## 2021-12-11 NOTE — TOC Initial Note (Addendum)
Transition of Care [facility]) - Initial/Assessment Note    Patient Details  Name: [name] MRN: [id number] Date of Birth: 26-Jun-1950  [facility]) CM/SW Contact:    Pollie Friar, RN Phone Number: 12/11/2021, 11:10 AM  Clinical Narrative:     Pt is from home with a roommate. Recommendations have been for CIR but not sure of support after a rehab stay.             Pt has been confused and aphasic. CM heard from CIR that son has decided on SNF rehab. CM called the son and emailed him the choices for SNF rehab. CM has asked that he make a decision today so we can get insurance started.  TOC following.  1310: Son, Legrand Como has decided he can not manage the decisions for SNF rehab for his dad. He has asked that his sister in [place] be the one to make the decisions. CM has called Amestine and gone over the process and the notes from therapy. She has been emailed the list of SNF rehab offers. She is to update CM by the end of the day.   83: Daughter selected [facility].  CM has asked the MOA to start insurance auth. [place] states they should have a bed over the weekend.   Expected Discharge Plan: Skilled Nursing Facility Barriers to Discharge: Continued Medical Work up   Patient Goals and CMS Choice   CMS Medicare.gov Compare Post Acute Care list provided to:: Patient Represenative (must comment) Choice offered to / list presented to : Adult Children  Expected Discharge Plan and Services Expected Discharge Plan: Skilled Nursing Facility In-house Referral: Clinical Social Work Discharge Planning Services: CM Consult Post Acute Care Choice: [place] Living arrangements for the past 2 months: Apartment                                      Prior Living Arrangements/Services Living arrangements for the past 2 months: Apartment Lives with:: Roommate Patient language and need for interpreter reviewed:: Yes        Need for Family Participation  in Patient Care: Yes (Comment) Care giver support system in place?: No (comment)   Criminal Activity/Legal Involvement Pertinent to Current Situation/Hospitalization: No - Comment as needed  Activities of Daily Living      Permission Sought/Granted                  Emotional Assessment Appearance:: Appears stated age     Orientation: : Oriented to Self   Psych Involvement: No (comment)  Admission diagnosis:  Stroke-like symptoms [R29.90] Suspected cerebrovascular accident (CVA) [R09.89] Patient Active Problem List   Diagnosis Date Noted   Suspected cerebrovascular accident (CVA) 12/08/2021   Aortic insufficiency 11/30/2021   Right bundle branch block 11/30/2021   Olecranon bursitis, right elbow 06/13/2021   Rupture of UCL of left thumb 03/18/2021   Aortic atherosclerosis ([place]) 12/10/2020   Chronic pain of right knee 12/10/2020   Bilateral elbow joint pain 07/09/2020   Acute renal failure superimposed on chronic kidney disease ([place]) 11/12/2019   Dry eyes 11/12/2019   S/P hernia repair 10/17/2019   Abdominal pain 07/06/2019   BPH with obstruction/lower urinary tract symptoms 03/09/2019   Clot retention of urine 02/11/2019   Vitamin D deficiency 09/13/2017   Urinary tract infection without hematuria 07/23/2017   Low back pain 07/03/2017   Dizziness  06/30/2017   Acute gouty arthritis 12/01/2016   Degenerative arthritis of knee, bilateral 11/18/2016   Urinary retention due to benign prostatic hyperplasia 10/24/2016   Mass of right side of neck 10/20/2016   Gout 10/20/2016   Right knee pain 10/12/2016   Hypokalemia 05/28/2016   Encounter for well adult exam with abnormal findings 11/27/2015   Obstructive uropathy 11/27/2015   Elevated PSA 11/27/2015   Acute pyelonephritis 05/27/2015   Generalized bloating 05/27/2015   Chronic constipation 05/27/2015   Chest pain 05/27/2015   AKI (acute kidney injury) ([place]) 05/27/2015   Acute sinus infection 11/22/2014   Cough  09/25/2014   Hemorrhoids, internal, with bleeding, prolapse 09/19/2014   Chronic UTI 05/29/2014   Hyperlipidemia 05/29/2014   Lumbar radiculopathy 05/23/2014   Lumbar stenosis 11/20/2013   Abnormal glucose 11/06/2013   Unspecified hereditary and idiopathic peripheral neuropathy 01/22/2013   Cardiomyopathy due to hypertension ([place]) 12/28/2011   CKD (chronic kidney disease) stage 3, GFR 30-59 ml/min (HCC) 11/24/2011   Hematuria 11/24/2011   Hydronephrosis 11/24/2011   Anemia 11/24/2011   BRADYCARDIA 02/05/2010   TINEA PEDIS 01/29/2010   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   PERIPHERAL EDEMA 01/29/2010   ABNORMAL ELECTROCARDIOGRAM 01/29/2010   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   Osteoarthrosis, generalized, 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   ONYCHOMYCOSIS, TOENAILS 10/02/2007   BPH (benign prostatic hyperplasia) 10/02/2007   Essential hypertension 03/06/2007   PCP:  Biagio Borg, MD Pharmacy:   [facility], [address] [place] [address] Phone: [phone] Fax: [phone]     Social Determinants of Health (SDOH) Interventions    Readmission Risk Interventions    10/22/2019   12:04 PM  Readmission Risk Prevention Plan  Transportation Screening Complete  PCP or Specialist Appt within 3-5 Days Not Complete  Not Complete comments pending medical stability  HRI or Home Care Consult Complete  Social Work Consult for [place] Planning/Counseling Complete  Palliative Care Screening Not Applicable  Medication Review (RN Care Manager) Referral to Pharmacy

## 2021-12-11 NOTE — Care Management Important Message (Signed)
Important Message  Patient Details  Name: [name] MRN: [id number] Date of Birth: 1950-08-07   Medicare Important Message Given:  Yes     Trey Gulbranson 12/11/2021, 2:20 PM

## 2021-12-11 NOTE — Progress Notes (Signed)
Physical Therapy Treatment Patient Details Name: [name] MRN: [id number] DOB: 03/21/1951 Today's Date: 12/11/2021   History of Present Illness Pt is a 71 y/o male admitted secondary to rigth sided weakness and difficulty speaking. 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 PMH including but not limited to hypertension, hyperlipidemia, CKD.    PT Comments    Pt supine in bed.  Upon entry assessed patient for pain and he reports pain in R hand.  He is tender to the touch and hand is very warm and swollen in comparison to L hand.  He required assistance to mobilize and appears more fatigued in the afternoon.  Minor pain noted in R knee.   Recommendations for follow up therapy are one component of a multi-disciplinary discharge planning process, led by the attending physician.  Recommendations may be updated based on patient status, additional functional criteria and insurance authorization.  Follow Up Recommendations  Acute inpatient rehab (3hours/day)     Assistance Recommended at Discharge Frequent or constant Supervision/Assistance  Patient can return home with the following A lot of help with bathing/dressing/bathroom;A lot of help with walking and/or transfers;Assistance with cooking/housework;Direct supervision/assist for medications management;Direct supervision/assist for financial management;Assist for transportation;Help with stairs or ramp for entrance   Equipment Recommendations       Recommendations for Other Services       Precautions / Restrictions Precautions Precautions: Fall Precaution Comments: R weakness Restrictions Weight Bearing Restrictions: No     Mobility  Bed Mobility Overal bed mobility: Needs Assistance Bed Mobility: Supine to Sit, Sit to Supine     Supine to sit: HOB elevated, Mod assist Sit to supine: Min assist   General bed mobility comments: Mod assistance to move trunk into sitting.  Pt required cues for hand placement due to  pain in R hand and shoulder.  He required assistance for trunk to move back to supine.    Transfers Overall transfer level: Needs assistance Equipment used: 1 person hand held assist Transfers: Sit to/from Stand Sit to Stand: Mod assist           General transfer comment: Mod assistance to move into standing.  Cues for L hand placement to push into standing.    Ambulation/Gait Ambulation/Gait assistance: Min assist, Mod assist Gait Distance (Feet): 40 Feet Assistive device: 1 person hand held assist (intermittent use of railing this session.) Gait Pattern/deviations: Step-through pattern, Decreased stride length, Wide base of support, Trunk flexed Gait velocity: reduced, variable     General Gait Details: Pt reaching for all objects in halls for balance and support.  He is unable to use RW due to pain in R hand.   Stairs             Wheelchair Mobility    Modified Rankin (Stroke Patients Only)       Balance Overall balance assessment: Needs assistance Sitting-balance support: Feet supported, No upper extremity supported Sitting balance-Leahy Scale: Fair       Standing balance-Leahy Scale: Poor                              Cognition Arousal/Alertness: Awake/alert Behavior During Therapy: WFL for tasks assessed/performed Overall Cognitive Status: Difficult to assess (d/t aphasia)  Exercises      General Comments        Pertinent Vitals/Pain Pain Assessment Pain Assessment: Faces Faces Pain Scale: Hurts even more Pain Location: L knee, RUE Pain Descriptors / Indicators: Grimacing, Discomfort Pain Intervention(s): Monitored during session, Repositioned    Home Living                          Prior Function            PT Goals (current goals can now be found in the care plan section) Acute Rehab PT Goals Patient Stated Goal: unable to state Potential to Achieve  Goals: Good Progress towards PT goals: Progressing toward goals    Frequency    Min 4X/week      PT Plan Current plan remains appropriate    Co-evaluation              AM-PAC PT "6 Clicks" Mobility   Outcome Measure  Help needed turning from your back to your side while in a flat bed without using bedrails?: A Little Help needed moving from lying on your back to sitting on the side of a flat bed without using bedrails?: A Little Help needed moving to and from a bed to a chair (including a wheelchair)?: A Lot Help needed standing up from a chair using your arms (e.g., wheelchair or bedside chair)?: A Lot Help needed to walk in hospital room?: A Lot Help needed climbing 3-5 steps with a railing? : Total 6 Click Score: 13    End of Session Equipment Utilized During Treatment: Gait belt Activity Tolerance: Patient tolerated treatment well Patient left: in chair;with call bell/phone within reach;with chair alarm set;Other (comment) Nurse Communication: Mobility status PT Visit Diagnosis: Other abnormalities of gait and mobility (R26.89)     Time: 7824-2353 PT Time Calculation (min) (ACUTE ONLY): 29 min  Charges:  $Gait Training: 8-22 mins $Therapeutic Activity: 8-22 mins                     Tiaja Hagan R. , PTA Acute Rehabilitation Services Office [phone]    Miloh Alcocer Eli Hose 12/11/2021, 3:22 PM

## 2021-12-11 NOTE — Telephone Encounter (Signed)
Please refill as per office routine med refill policy (all routine meds to be refilled for 3 mo or monthly (per pt preference) up to one year from last visit, then month to month grace period for 3 mo, then further med refills will have to be denied) ? ?

## 2021-12-11 NOTE — Progress Notes (Signed)
Progress Note  Patient: Marguis Mathieson SAY:[id number] DOB: January 22, 1951  DOA: 12/08/2021  DOS: 12/11/2021    Brief hospital course: Jimmylee Ratterree is a 71 y.o. male with a history of HTN, HLD, chronic HFpEF, mod AI, BPH, OAB, gout who presented to [facility] with dysphasia and right-sided weakness. CT head was nonacutre, though he was admitted to [facility] for MRI which showed M2 branch occlusion with infarct. Neurology is consulted, DAPT initiated, and SNF disposition is sought. On 7/7, developing acute gout of right hand for which steroids started.  Assessment and Plan: Acute left MCA branch (M2) CVA: With resultant significant expressive aphasia and some right-sided weakness. No CES or shunt on echo with preserved LVEF. - Neurology consulted, discussed with Dr. Leonie Man.  - Continue cardiac telemetry. Will anticipate 30-day cardiac monitoring at discharge with high suspicion for embolic etiology.  - Continue ASA, plavix x3 weeks, then plavix alone. - Continue high-intensity statin. LDL is 68. HbA1c 5.6%.  - PT/OT/SLP > pursuing SNF   Aortic root dilatation: 32m by echo. Also 379mascending thoracic aorta dilatation.  - Surveillance recommended.   HTN:  - Permissive HTN for now. Pharmacy unable to confirm home meds, though recent outpatient visit with cardiology reviewed. Will initiate reported meds from that time of norvasc '10mg'$ , metoprolol '50mg'$  BID  HLD:  - Continue atorvastatin. LDL is at goal at 68.   Chronic HFpEF, moderate AI: Echo with mild concentric LVH, indeterminate diastolic function, LVEF 5523-55%normal RV size and function. Mild to moderate AI - Appears compensated currently.   Gout: Acute attack on digits of right hand. - Start IV steroid, give allopurinol and colchicine (home meds that were not able to be verified).  - Monitor clinically.   BPH with LUTS:  - Reported prior notes show he has I/O caths at home. Will monitor bladder scans and cath prn  Stage IIIa CKD:  - Monitor, avoid  nephrotoxins, NSAIDs. - R/o BOO with bladder scans.  Chronic right knee pain, usually walks with cane. Will monitor.  - Follow up with Dr. DaRhona Raiders planned per outpatient sports medicine notes.  Hypokalemia:  - Supplement again today.  Hyperbilirubinemia: Monitor at follow up. No evidence of hemolysis or other LFT abnormalities, ?Gilbert's.   Obesity: Estimated body mass index is 34.18 kg/m as calculated from the following:   Height as of 11/30/21: '6\' 1"'$  (1.854 m).   Weight as of this encounter: 117.5 kg.  Subjective: Able to move right side a bit more and speaking a bit more though still with significant symptoms. Does confirm a history of gout when inquired.  Objective: Vitals:   12/10/21 2333 12/11/21 0500 12/11/21 0850 12/11/21 1123  BP: (!) 159/109 (!) 152/99 (!) 160/100 (!) 167/112  Pulse: 74 77  80  Resp: '20 20 20 18  '$ Temp: 98.1 F (36.7 C) 98.2 F (36.8 C) 98.7 F (37.1 C) 98.3 F (36.8 C)  TempSrc:  Oral Oral   SpO2: 99% 98% 99% 100%  Weight:       Gen: [age].o. male in no distress Pulm: Nonlabored breathing room air. Clear. CV: Regular rate and rhythm. No murmur, rub, or gallop. No JVD, no dependent edema. GI: Abdomen soft, non-tender, non-distended, with normoactive bowel sounds.  Ext: Warm, dry. Right hand tender to palpation mostly over MCP joints and diffusely swollen.  Skin: No new rashes, lesions or ulcers on visualized skin. Neuro: Alert, interactive, right hemiplegia is improved. Strength in RUE ~3/5 at shoulder, grip limited by severe pain, RLE  3-4/5, +aphasia, no new focal neurological deficits. Psych: Judgement and insight appear fair. Mood euthymic & affect congruent. Behavior is appropriate.    Data Personally reviewed: CBC: Recent Labs  Lab 12/08/21 1841 12/08/21 1845 12/09/21 0507  WBC 6.4  --  5.9  NEUTROABS 1.8  --  2.5  HGB 15.4 15.6 15.6  HCT 45.5 46.0 47.0  MCV 87.7  --  86.4  PLT 282  --  578   Basic Metabolic Panel: Recent Labs   Lab 12/08/21 1841 12/08/21 1845 12/09/21 0507 12/11/21 0133  NA 141 143 144 142  K 3.0* 3.1* 3.0* 3.3*  CL 103 101 104 104  CO2 29  --  27 26  GLUCOSE 98 93 82 103*  BUN '20 18 15 18  '$ CREATININE 1.74* 2.30* 1.94* 2.20*  CALCIUM 9.2  --  9.2 9.1  MG  --   --  1.8  --   PHOS  --   --  2.5  --    GFR: Estimated Creatinine Clearance: 41.3 mL/min (A) (by C-G formula based on SCr of 2.2 mg/dL (H)). Liver Function Tests: Recent Labs  Lab 12/08/21 1841 12/09/21 0507 12/11/21 0133  AST '19 19 20  '$ ALT '15 15 11  '$ ALKPHOS 100 90 81  BILITOT 1.0 1.3* 1.5*  PROT 7.8 7.2 6.7  ALBUMIN 4.1 3.5 3.5   No results for input(s): "LIPASE", "AMYLASE" in the last 168 hours. Recent Labs  Lab 12/08/21 1844  AMMONIA <10   Coagulation Profile: Recent Labs  Lab 12/08/21 1841  INR 1.0   Cardiac Enzymes: No results for input(s): "CKTOTAL", "CKMB", "CKMBINDEX", "TROPONINI" in the last 168 hours. BNP (last 3 results) No results for input(s): "PROBNP" in the last 8760 hours. HbA1C: Recent Labs    12/09/21 0507  HGBA1C 5.6   CBG: Recent Labs  Lab 12/08/21 1833  GLUCAP 83   Lipid Profile: Recent Labs    12/09/21 0507  CHOL 123  HDL 48  LDLCALC 68  TRIG 36  CHOLHDL 2.6   Thyroid Function Tests: No results for input(s): "TSH", "T4TOTAL", "FREET4", "T3FREE", "THYROIDAB" in the last 72 hours. Anemia Panel: No results for input(s): "VITAMINB12", "FOLATE", "FERRITIN", "TIBC", "IRON", "RETICCTPCT" in the last 72 hours. Urine analysis:    Component Value Date/Time   COLORURINE STRAW (A) 12/08/2021 2022   APPEARANCEUR CLEAR 12/08/2021 2022   LABSPEC 1.005 12/08/2021 2022   PHURINE 7.0 12/08/2021 2022   GLUCOSEU NEGATIVE 12/08/2021 2022   GLUCOSEU NEGATIVE 06/12/2021 1510   HGBUR SMALL (A) 12/08/2021 2022   HGBUR negative 01/29/2010 0000   BILIRUBINUR NEGATIVE 12/08/2021 2022   BILIRUBINUR neg 11/26/2017 1304   KETONESUR NEGATIVE 12/08/2021 2022   PROTEINUR NEGATIVE 12/08/2021  2022   UROBILINOGEN 0.2 06/12/2021 1510   NITRITE NEGATIVE 12/08/2021 2022   LEUKOCYTESUR NEGATIVE 12/08/2021 2022   Recent Results (from the past 240 hour(s))  Resp Panel by RT-PCR (Flu A&B, Covid) Anterior Nasal Swab     Status: None   Collection Time: 12/08/21  6:41 PM   Specimen: Anterior Nasal Swab  Result Value Ref Range Status   SARS Coronavirus 2 by RT PCR NEGATIVE NEGATIVE Final    Comment: (NOTE) SARS-CoV-2 target nucleic acids are NOT DETECTED.  The SARS-CoV-2 RNA is generally detectable in upper respiratory specimens during the acute phase of infection. The lowest concentration of SARS-CoV-2 viral copies this assay can detect is 138 copies/mL. A negative result does not preclude SARS-Cov-2 infection and should not be used as the sole  basis for treatment or other patient management decisions. A negative result may occur with  improper specimen collection/handling, submission of specimen other than nasopharyngeal swab, presence of viral mutation(s) within the areas targeted by this assay, and inadequate number of viral copies(<138 copies/mL). A negative result must be combined with clinical observations, patient history, and epidemiological information. The expected result is Negative.  Fact Sheet for Patients:  EntrepreneurPulse.com.au  Fact Sheet for Healthcare Providers:  IncredibleEmployment.be  This test is no t yet approved or cleared by the [place] FDA and  has been authorized for detection and/or diagnosis of SARS-CoV-2 by FDA under an Emergency Use Authorization (EUA). This EUA will remain  in effect (meaning this test can be used) for the duration of the COVID-19 declaration under Section 564(b)(1) of the Act, 21 U.S.C.section 360bbb-3(b)(1), unless the authorization is terminated  or revoked sooner.       Influenza A by PCR NEGATIVE NEGATIVE Final   Influenza B by PCR NEGATIVE NEGATIVE Final    Comment:  (NOTE) The Xpert Xpress SARS-CoV-2/FLU/RSV plus assay is intended as an aid in the diagnosis of influenza from Nasopharyngeal swab specimens and should not be used as a sole basis for treatment. Nasal washings and aspirates are unacceptable for Xpert Xpress SARS-CoV-2/FLU/RSV testing.  Fact Sheet for Patients: EntrepreneurPulse.com.au  Fact Sheet for Healthcare Providers: IncredibleEmployment.be  This test is not yet approved or cleared by the [place] FDA and has been authorized for detection and/or diagnosis of SARS-CoV-2 by FDA under an Emergency Use Authorization (EUA). This EUA will remain in effect (meaning this test can be used) for the duration of the COVID-19 declaration under Section 564(b)(1) of the Act, 21 U.S.C. section 360bbb-3(b)(1), unless the authorization is terminated or revoked.  Performed at [facility], [place] [address] [place], [place] [address]      No results found.  Family Communication: None at bedside  Disposition: Status is: Inpatient Remains inpatient appropriate because: Unsafe DC, needs rehabilitation. Also requires treatment for acute gout. Planned Discharge Destination: SNF pending selection and insurance authorization, likely 7/8.  Patrecia Pour, MD 12/11/2021 3:04 PM Page by [email or web address]

## 2021-12-11 NOTE — Progress Notes (Signed)
Occupational Therapy Treatment Patient Details Name: [name] MRN: [id number] DOB: [birth year] Today's Date: 12/11/2021   History of present illness Pt is a 71 y/o male admitted secondary to rigth sided weakness and difficulty speaking. 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 PMH including but not limited to hypertension, hyperlipidemia, CKD.   OT comments  Pt greeted supine in bed  agreeable to OT intervention.pt continues to present with expressive aphasia, increased pain ( gesturing to R hand and L knee), impaired balance and R sided weakness. Pt required MIN A to rise from elevated EOB with HHA, pt was abel to take a couple steps towards the sink but continues to gesture in pain to L knee needing to sit back down EOB. Pt with a lot of pain when OTA attempts to assess R hand however noted work up for gout in R hand perMD note today. Family can not provide 24/7 support needed after CIR stay therefore DC recs updated to SNF with family agreeable, will alert OTR concerning change in POC. Will continue to follow pt for OT needs.                  Recommendations for follow up therapy are one component of a multi-disciplinary discharge planning process, led by the attending physician.  Recommendations may be updated based on patient status, additional functional criteria and insurance authorization.    Follow Up Recommendations  Skilled nursing-short term rehab (<3 hours/day)    Assistance Recommended at Discharge Frequent or constant Supervision/Assistance  Patient can return home with the following  A lot of help with walking and/or transfers;A lot of help with bathing/dressing/bathroom;Assistance with feeding   Equipment Recommendations   (to be determined by rehab facility)    Recommendations for Other Services      Precautions / Restrictions Precautions Precautions: Fall Precaution Comments: R weakness Restrictions Weight Bearing Restrictions: No       Mobility  Bed Mobility   Bed Mobility: Supine to Sit, Sit to Supine     Supine to sit: Min assist, HOB elevated Sit to supine: Min assist, HOB elevated   General bed mobility comments: + time and effort, MIN verbal cues for sequencing, light MIN A to progress BLEs to EOB d/t pain, HOB slightly elevated    Transfers Overall transfer level: Needs assistance Equipment used: 1 person hand held assist Transfers: Sit to/from Stand Sit to Stand: Min assist, From elevated surface           General transfer comment: + time and effort to stand from elevated EOB d/t L knee pain, pt gesturing at knee, was able to take a couple steps towards sink but perseverating on gesturing to knee therefore returned to sitting EOB for pain mgmt     Balance Overall balance assessment: Needs assistance Sitting-balance support: Feet supported, No upper extremity supported Sitting balance-Leahy Scale: Fair     Standing balance support: Single extremity supported, During functional activity Standing balance-Leahy Scale: Poor                             ADL either performed or assessed with clinical judgement   ADL Overall ADL's : Needs assistance/impaired                         Toilet Transfer: Minimal assistance;Moderate assistance;Ambulation Toilet Transfer Details (indicate cue type and reason): simulated via functional mobility, able to take  a couple steps away from bed but limited by pain in L knee         Functional mobility during ADLs: Minimal assistance;Moderate assistance General ADL Comments: emphasis on functional mobility, RUE AROM, increasing activity tolerance and standing balance for ADL participation    Extremity/Trunk Assessment Upper Extremity Assessment Upper Extremity Assessment: RUE deficits/detail RUE Deficits / Details: difficult to assess d/t pain in RUE, limited shoulder flexion, unable to complete full composite digit flexion to grip RW, WFL elbow  flexion RUE Sensation: decreased proprioception RUE Coordination: decreased fine motor;decreased gross motor   Lower Extremity Assessment Lower Extremity Assessment: Defer to PT evaluation        Vision Baseline Vision/History: 1 Wears glasses Vision Assessment?: Vision impaired- to be further tested in functional context (difficult to assess d/t cog)   Perception Perception Comments: informally note decreased awarness and coordination in [place]: Impaired Praxis Impairment Details: Motor planning    Cognition Arousal/Alertness: Awake/alert Behavior During Therapy: WFL for tasks assessed/performed Overall Cognitive Status: Difficult to assess (d/t aphasia)                                 General Comments: pt following commands but limited by aphasia, pt able to state words but difficulty with sentences        Exercises      Shoulder Instructions       General Comments      Pertinent Vitals/ Pain       Pain Assessment Pain Assessment: Faces Faces Pain Scale: Hurts even more Pain Location: L knee, RUE Pain Descriptors / Indicators: Grimacing, Discomfort Pain Intervention(s): Limited activity within patient's tolerance, Monitored during session, Repositioned  Home Living                                          Prior Functioning/Environment              Frequency  Min 2X/week        Progress Toward Goals  OT Goals(current goals can now be found in the care plan section)  Progress towards OT goals: Progressing toward goals (gradually)  Acute Rehab OT Goals OT Goal Formulation: Patient unable to participate in goal setting Time For Goal Achievement: 12/23/21 Potential to Achieve Goals: [place] Discharge plan needs to be updated;Frequency needs to be updated    Co-evaluation                 AM-PAC OT "6 Clicks" Daily Activity     Outcome Measure   Help from another person eating  meals?: A Little Help from another person taking care of personal grooming?: A Little Help from another person toileting, which includes using toliet, bedpan, or urinal?: A Lot Help from another person bathing (including washing, rinsing, drying)?: A Lot Help from another person to put on and taking off regular upper body clothing?: A Little Help from another person to put on and taking off regular lower body clothing?: A Lot 6 Click Score: 15    End of Session Equipment Utilized During Treatment: Gait belt  OT Visit Diagnosis: Unsteadiness on feet (R26.81);Other abnormalities of gait and mobility (R26.89);Muscle weakness (generalized) (M62.81);Apraxia (R48.2);Other symptoms and signs involving the nervous system (R29.898);Other symptoms and signs involving cognitive function;Hemiplegia and hemiparesis   Activity  Tolerance Patient limited by pain;Other (comment) (L knee/ RUE)   Patient Left in bed;with call bell/phone within reach;with bed alarm set   Nurse Communication Mobility status;Other (comment) (increased pain)        Time: 0156-1537 OT Time Calculation (min): 15 min  Charges: OT General Charges $OT Visit: 1 Visit OT Treatments $Self Care/Home Management : 8-22 mins  Harley Alto., COTA/L Acute Rehabilitation Services [phone]   Precious Haws 12/11/2021, 2:31 PM

## 2021-12-11 NOTE — Progress Notes (Signed)
STROKE TEAM PROGRESS NOTE   INTERVAL HISTORY No family at the bedside. Patient continues to show  improvement in his aphasia and is now able to speak occasional short sentences and follows to mimic and occasional midline commands.  Vital signs are stable.  Neurological exam is unchanged.  Telemetry monitoring so far has not shown A-fib Vitals:   12/10/21 2125 12/10/21 2333 12/11/21 0500 12/11/21 0850  BP: (!) 144/99 (!) 159/109 (!) 152/99 (!) 160/100  Pulse: 75 74 77   Resp: '18 20 20 20  '$ Temp: 98.7 F (37.1 C) 98.1 F (36.7 C) 98.2 F (36.8 C) 98.7 F (37.1 C)  TempSrc: Oral  Oral Oral  SpO2: 98% 99% 98% 99%  Weight:       CBC:  Recent Labs  Lab 12/08/21 1841 12/08/21 1845 12/09/21 0507  WBC 6.4  --  5.9  NEUTROABS 1.8  --  2.5  HGB 15.4 15.6 15.6  HCT 45.5 46.0 47.0  MCV 87.7  --  86.4  PLT 282  --  119   Basic Metabolic Panel:  Recent Labs  Lab 12/09/21 0507 12/11/21 0133  NA 144 142  K 3.0* 3.3*  CL 104 104  CO2 27 26  GLUCOSE 82 103*  BUN 15 18  CREATININE 1.94* 2.20*  CALCIUM 9.2 9.1  MG 1.8  --   PHOS 2.5  --    Lipid Panel:  Recent Labs  Lab 12/09/21 0507  CHOL 123  TRIG 36  HDL 48  CHOLHDL 2.6  VLDL 7  LDLCALC 68   HgbA1c:  Recent Labs  Lab 12/09/21 0507  HGBA1C 5.6   Urine Drug Screen:  Recent Labs  Lab 12/08/21 2022  LABOPIA NONE DETECTED  COCAINSCRNUR NONE DETECTED  LABBENZ NONE DETECTED  AMPHETMU NONE DETECTED  THCU NONE DETECTED  LABBARB NONE DETECTED    Alcohol Level  Recent Labs  Lab 12/08/21 1841  ETH <10    IMAGING past 24 hours No results found.  PHYSICAL EXAM  Physical Exam  Constitutional: Appears well-developed and well-nourished.  Elderly African male Cardiovascular: Normal rate and regular rhythm.  Respiratory: Effort normal, non-labored breathing  Neuro: Mental Status: Patient is awake, globally aphasia.  Speech is gibberish nonsensical words.  Not able to follow 1 simple midline commands.  Does not  mimic. Cranial Nerves: II: blinks to threat bilaterally III,IV, VI: EOMI without ptosis or diploplia.  V: Facial sensation is symmetric to temperature VII: Facial movement is symmetric resting and smiling VIII: Hearing is intact to voice X: Palate elevates symmetrically XI: Shoulder shrug is symmetric. XII: Tongue protrudes midline without atrophy or fasciculations.  Motor: Tone is normal. Bulk is normal. 5/5 strength was present in all four extremities.  Though prefers to move left side more Sensory: Sensation is symmetric to light touch and temperature in the arms and legs. No extinction to DSS present.  Deep Tendon Reflexes: 2+ and symmetric in the biceps and patellae.  Plantars: Toes are downgoing bilaterally.  Cerebellar: FNF and HKS are intact bilaterally   ASSESSMENT/PLAN Mr. Jervon Ream is a 71 y.o. male with history of  HTN, HLD, chronic HFpEF, mod AI, BPH, OAB, gout who presented to [facility] with dysphasia and right-sided weakness. He was found by his roommate who had not seen him since 7pm on 7/3. MRI showed a left M2 infarct.  Consider a loop recorder vs 30 day monitor for long term cardiac monitoring. 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 Small vessel disease. MRI  Acute posterior left MCA territory infarct MRA  Positive for Left MCA posterior M2 branch poor flow or occlusion. 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 Distal Right ICA and Basilar arteries are up to 8 mm diameter. Evidence of superimposed intracranial atherosclerosis, including moderate bilateral distal ACA A2 segment stenosis. 2D Echo bubble study negative, EF 55-60% LDL 68 HgbA1c 5.6 VTE prophylaxis - lovenox    Diet   Diet Heart Room service appropriate? No; Fluid consistency: Thin   aspirin  81 mg daily prior to admission, now on aspirin 81 mg daily and clopidogrel 75 mg daily.  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 MD to update note as needed.   Patient presented with aphasia due to left MCA branch infarct left MCA branch occlusion likely of embolic etiology.  Unfortunately remains severely aphasic.  No family available at the bedside for discussion.   Recommend 30-day heart monitoring to look for paroxysmal A-fib at discharge.  Continue aspirin and Plavix for 3 weeks followed by Plavix alone and aggressive risk factor modification.  Physical occupational and speech therapies.  Discussed with Dr.Grunz.  Stroke team will sign off.  Kindly call for questions greater   Antony Contras, MD Medical Director [place] Pager: [phone] 12/11/2021 11:14 AM   To contact Stroke Continuity provider, please refer to [email or web address] After hours, contact General Neurology's

## 2021-12-11 NOTE — Progress Notes (Signed)
Inpatient Rehab Admissions Coordinator:    I spoke with Son, Legrand Como, and he states that family is not going to be able to provide 24/7 support. Pt. Will need SNF. CIR will sign off. TOC notified.   Clemens Catholic, [place], [place] Admissions Coordinator  [phone] ([place]) [phone] (office)

## 2021-12-12 DIAGNOSIS — G8929 Other chronic pain: Secondary | ICD-10-CM | POA: Diagnosis not present

## 2021-12-12 DIAGNOSIS — I5032 Chronic diastolic (congestive) heart failure: Secondary | ICD-10-CM

## 2021-12-12 DIAGNOSIS — M2550 Pain in unspecified joint: Secondary | ICD-10-CM | POA: Diagnosis not present

## 2021-12-12 DIAGNOSIS — I700 Atherosclerosis of aorta: Secondary | ICD-10-CM

## 2021-12-12 DIAGNOSIS — I77810 Thoracic aortic ectasia: Secondary | ICD-10-CM | POA: Diagnosis not present

## 2021-12-12 DIAGNOSIS — M109 Gout, unspecified: Secondary | ICD-10-CM | POA: Diagnosis not present

## 2021-12-12 DIAGNOSIS — I714 Abdominal aortic aneurysm, without rupture, unspecified: Secondary | ICD-10-CM | POA: Diagnosis not present

## 2021-12-12 DIAGNOSIS — N1832 Chronic kidney disease, stage 3b: Secondary | ICD-10-CM

## 2021-12-12 DIAGNOSIS — I679 Cerebrovascular disease, unspecified: Secondary | ICD-10-CM | POA: Diagnosis not present

## 2021-12-12 DIAGNOSIS — I4510 Unspecified right bundle-branch block: Secondary | ICD-10-CM | POA: Diagnosis not present

## 2021-12-12 DIAGNOSIS — R1312 Dysphagia, oropharyngeal phase: Secondary | ICD-10-CM | POA: Diagnosis not present

## 2021-12-12 DIAGNOSIS — N138 Other obstructive and reflux uropathy: Secondary | ICD-10-CM

## 2021-12-12 DIAGNOSIS — M25561 Pain in right knee: Secondary | ICD-10-CM | POA: Diagnosis not present

## 2021-12-12 DIAGNOSIS — I639 Cerebral infarction, unspecified: Secondary | ICD-10-CM

## 2021-12-12 DIAGNOSIS — M170 Bilateral primary osteoarthritis of knee: Secondary | ICD-10-CM | POA: Diagnosis not present

## 2021-12-12 DIAGNOSIS — I10 Essential (primary) hypertension: Secondary | ICD-10-CM

## 2021-12-12 DIAGNOSIS — M6281 Muscle weakness (generalized): Secondary | ICD-10-CM | POA: Diagnosis not present

## 2021-12-12 DIAGNOSIS — R498 Other voice and resonance disorders: Secondary | ICD-10-CM | POA: Diagnosis not present

## 2021-12-12 DIAGNOSIS — R2689 Other abnormalities of gait and mobility: Secondary | ICD-10-CM | POA: Diagnosis not present

## 2021-12-12 DIAGNOSIS — I6930 Unspecified sequelae of cerebral infarction: Secondary | ICD-10-CM | POA: Diagnosis not present

## 2021-12-12 DIAGNOSIS — R6889 Other general symptoms and signs: Secondary | ICD-10-CM | POA: Diagnosis not present

## 2021-12-12 DIAGNOSIS — Z9981 Dependence on supplemental oxygen: Secondary | ICD-10-CM | POA: Diagnosis not present

## 2021-12-12 DIAGNOSIS — E7800 Pure hypercholesterolemia, unspecified: Secondary | ICD-10-CM

## 2021-12-12 DIAGNOSIS — I351 Nonrheumatic aortic (valve) insufficiency: Secondary | ICD-10-CM

## 2021-12-12 DIAGNOSIS — R001 Bradycardia, unspecified: Secondary | ICD-10-CM | POA: Diagnosis not present

## 2021-12-12 DIAGNOSIS — Z7401 Bed confinement status: Secondary | ICD-10-CM | POA: Diagnosis not present

## 2021-12-12 DIAGNOSIS — N401 Enlarged prostate with lower urinary tract symptoms: Secondary | ICD-10-CM

## 2021-12-12 DIAGNOSIS — R4701 Aphasia: Secondary | ICD-10-CM | POA: Diagnosis not present

## 2021-12-12 DIAGNOSIS — R262 Difficulty in walking, not elsewhere classified: Secondary | ICD-10-CM | POA: Diagnosis not present

## 2021-12-12 DIAGNOSIS — I119 Hypertensive heart disease without heart failure: Secondary | ICD-10-CM | POA: Diagnosis not present

## 2021-12-12 DIAGNOSIS — N189 Chronic kidney disease, unspecified: Secondary | ICD-10-CM | POA: Diagnosis not present

## 2021-12-12 DIAGNOSIS — E876 Hypokalemia: Secondary | ICD-10-CM | POA: Diagnosis not present

## 2021-12-12 LAB — BASIC METABOLIC PANEL
Anion gap: 14 (ref 5–15)
BUN: 22 mg/dL (ref 8–23)
CO2: 23 mmol/L (ref 22–32)
Calcium: 9.6 mg/dL (ref 8.9–10.3)
Chloride: 103 mmol/L (ref 98–111)
Creatinine, Ser: 2.41 mg/dL — ABNORMAL HIGH (ref 0.61–1.24)
GFR, Estimated: 28 mL/min — ABNORMAL LOW (ref >60)
Glucose, Bld: 154 mg/dL — ABNORMAL HIGH (ref 70–99)
Potassium: 3.7 mmol/L (ref 3.5–5.1)
Sodium: 140 mmol/L (ref 135–145)

## 2021-12-12 MED ORDER — TRAMADOL HCL 50 MG PO TABS: 50.0000 mg | ORAL_TABLET | Freq: Two times a day (BID) | ORAL | 0 refills | Status: DC | PRN

## 2021-12-12 MED ORDER — ATORVASTATIN CALCIUM 80 MG PO TABS
80.0000 mg | ORAL_TABLET | Freq: Every day | ORAL | Status: AC
Start: 2021-12-12 — End: ?

## 2021-12-12 MED ORDER — CLOPIDOGREL BISULFATE 75 MG PO TABS: 75.0000 mg | ORAL_TABLET | Freq: Every day | ORAL | Status: AC

## 2021-12-12 MED ORDER — ASPIRIN EC 81 MG PO TBEC: 81.0000 mg | DELAYED_RELEASE_TABLET | Freq: Every day | ORAL | 0 refills | Status: AC

## 2021-12-12 MED ORDER — PREDNISONE 20 MG PO TABS: 40.0000 mg | ORAL_TABLET | Freq: Every day | ORAL | Status: DC

## 2021-12-12 NOTE — Progress Notes (Signed)
[place] auth received [phone], valid 7/8-7/11) and [facility] is able to accept pt today. MD updated.   Wandra Feinstein, MSW, LCSW [phone] (coverage)

## 2021-12-12 NOTE — Discharge Summary (Signed)
Physician Discharge Summary   Patient: [name] MRN: [id number] DOB: 1950/08/13  Admit date:     12/08/2021  Discharge date: 12/12/21  Discharge Physician: Patrecia Pour   PCP: Biagio Borg, MD   Recommendations at discharge:  Recommend repeat CMP in the next week to monitor renal function, potassium, and bilirubin levels.  Follow up with neurology, [facility], in 6-8 weeks for post-stroke care. Discharged on DAPT x3 weeks, then plavix alone, atorvastatin, and plans to pursue 30-day cardiac monitoring. Continue prednisone, allopurinol, and colchicine at SNF for acute on chronic gout affecting right hand which is much improved. Suggest surveillance of 2m aortic root and 366mascending aortic dilatation.  Follow up with PCP after SNF discharge for routine care.   Discharge Diagnoses: Principal Problem:   Acute [facility] (cerebrovascular accident) [facility] Problems:   Essential hypertension   Chronic kidney disease, stage 3b (HCC)   Hyperlipidemia   Hypokalemia   Acute gouty arthritis   BPH with obstruction/lower urinary tract symptoms   Aortic atherosclerosis (HCC)   Chronic pain of right knee   Aortic insufficiency   Right bundle branch block   Aortic root dilatation (HCC)   Chronic heart failure with [facility] ejection fraction (HFpEF) ([facility]  Hospital Course: PiKmarion Rawls a [age].o. male with a history of HTN, HLD, chronic HFpEF, mod AI, BPH, OAB, gout [facility] CT head was [facility]acutre, [facility] occlusion with infarct. Neurology is consulted, DAPT initiated, and SNF disposition is sought as he remains in need of PT, OT, and speech therapy services due to persistent symptoms. On 7/7, he developed acute gout of right hand which has improved significantly with steroid initiation. Details discussed below.  Assessment and Plan: Acute left MCA branch (M2) CVA: With resultant  significant expressive aphasia and some right-sided weakness which has improved but not resolved. No CES or shunt on echo with preserved LVEF. - Neurology consulted, recommends 30-day cardiac monitoring at discharge with high suspicion for embolic etiology. No atrial fibrillation noted throughout hospitalization on cardiac telemetry. Spoke with cardiology, [facility], who will arrange this after discharge. - Continue ASA, plavix x3 weeks, then plavix alone. - Continue high-intensity statin. LDL is 68. HbA1c 5.6%.  - PT/OT/SLP > pursuing SNF    Aortic root dilatation: 4143my echo. Also 80m35mcending thoracic aorta dilatation.  - Surveillance recommended.    HTN:  - Permissive HTN for now. Pharmacy unable to confirm home meds, though recent outpatient visit with cardiology reviewed. Will initiate reported meds from that time of norvasc '10mg'$ , metoprolol '50mg'$  BID   HLD:  - Continue atorvastatin at increased dose of '80mg'$  from '40mg'$ . LDL is at goal at 68.    Chronic HFpEF, moderate AI: Echo with mild concentric LVH, indeterminate diastolic function, LVEF 55-654-27%rmal RV size and function. Mild to moderate AI - Appears compensated currently.    Gout: Acute attack on digits of right hand. - Started IV steroid with significant improvement, recommend continuing prednisone with taper as directed by SNF provider based on symptoms.  - Continue allopurinol and colchicine.   BPH with LUTS:  - Reported prior notes show he has I/O caths at home. Suggest continued intermittent catheterizations.   Stage IIIb CKD: With history of recurrent obstructive uropathy.  - Monitor, avoid nephrotoxins, NSAIDs.   Chronic right knee pain, usually walks with cane. Will monitor.  - Follow up with Dr.  Rhona Raider is planned per outpatient sports medicine notes.   Hypokalemia: Resolved. Continue home baseline supplement.   Hyperbilirubinemia: Monitor at follow up. No evidence of hemolysis or other LFT abnormalities,  ?Gilbert's.    Obesity: Estimated body mass index is 34.18 kg/m    Consultants: Neurology Procedures performed: Echocardiogram  Disposition: Skilled nursing facility Diet recommendation:  Cardiac diet DISCHARGE MEDICATION: Allergies as of 12/12/2021   No Known Allergies      Medication List     TAKE these medications    acetaminophen 500 MG tablet Commonly known as: TYLENOL Take 1,000 mg by mouth every 6 (six) hours as needed for moderate pain.   allopurinol 100 MG tablet Commonly known as: ZYLOPRIM Take 2 tablets (200 mg total) by mouth daily.   amLODipine 10 MG tablet Commonly known as: NORVASC Take 1 tablet (10 mg total) by mouth daily.   aspirin EC 81 MG tablet Take 1 tablet (81 mg total) by mouth daily for 21 days.   atorvastatin 80 MG tablet Commonly known as: LIPITOR Take 1 tablet (80 mg total) by mouth daily. What changed:  medication strength how much to take   cholecalciferol 25 MCG (1000 UNIT) tablet Commonly known as: VITAMIN D3 Take 1,000 Units by mouth daily.   clopidogrel 75 MG tablet Commonly known as: PLAVIX Take 1 tablet (75 mg total) by mouth daily. Start taking on: December 13, 2021   colchicine 0.6 MG tablet Take 1 tablet (0.6 mg total) by mouth daily.   hydroxypropyl methylcellulose / hypromellose 2.5 % ophthalmic solution Commonly known as: ISOPTO TEARS / GONIOVISC Place 1-2 drops into both eyes 3 (three) times daily as needed (dry/irritated eyes.).   lactulose 10 GM/15ML solution Commonly known as: CHRONULAC TAKE 15 TO 30 MLS BY MOUTH ONCE DAILY What changed: See the new instructions.   meclizine 25 MG tablet Commonly known as: ANTIVERT Take 1 tablet (25 mg total) by mouth 2 (two) times daily as needed for up to 20 doses for dizziness.   metoprolol tartrate 50 MG tablet Commonly known as: LOPRESSOR Take 1 tablet by mouth twice daily   multivitamin with minerals Tabs tablet Take 1 tablet by mouth daily.   Potassium 99 MG  Tabs Take 99 mg by mouth daily.   predniSONE 20 MG tablet Commonly known as: DELTASONE Take 2 tablets (40 mg total) by mouth daily with breakfast. Start taking on: December 13, 2021   traMADol 50 MG tablet Commonly known as: ULTRAM Take 1 tablet (50 mg total) by mouth every 12 (twelve) hours as needed for severe pain. What changed:  when to take this reasons to take this        Follow-up Information     Biagio Borg, MD. Schedule an appointment as soon as possible for a visit.   Specialties: Internal Medicine, Radiology Contact information: [place] [address] [phone]         Garvin Fila, MD. Schedule an appointment as soon as possible for a visit in 2 month(s).   Specialties: Neurology, Radiology Contact information: [address] [place] [address] [phone]                Discharge Exam: BP (!) 146/103   Pulse 71   Temp 98.5 F (36.9 C) (Oral)   Resp 20   Wt 117.5 kg   SpO2 91%   BMI 34.18 kg/m   71yo M alert, interactive, with expressive aphasia consisting of word salad.  Regular, no edema Clear, nonlabored Nontender, soft, +BS Right hand much less swollen and no significant tenderness to palpation (drastic improvement from yesterday). No erythema noted. Bony hypertrophy of both knees without effusions or tenderness to palpation.  Condition at discharge: stable  The results of significant diagnostics from this hospitalization (including imaging, microbiology, ancillary and laboratory) are listed below for reference.   Imaging Studies: ECHOCARDIOGRAM COMPLETE BUBBLE STUDY  Result Date: 12/09/2021    ECHOCARDIOGRAM REPORT   Patient Name:   MACYN REMMERT Date of Exam: 12/09/2021 Medical Rec #:  [id number]    Height:       73.0 in Accession #:    [id number]   Weight:       259.0 lb Date of Birth:  1950-10-24    BSA:          2.402 m Patient Age:    89 years     BP:           160/91 mmHg Patient Gender: M             HR:           83 bpm. Exam Location:  Inpatient Procedure: 2D Echo, Cardiac Doppler, Color Doppler and Saline Contrast Bubble            Study Indications:    Stroke  History:        Patient has prior history of Echocardiogram examinations. Risk                 Factors:Hypertension.  Sonographer:    Jyl Heinz Referring Phys: [id number] [place]  1. Left ventricular ejection fraction, by estimation, is 55 to 60%. The left ventricle has normal function. The left ventricle has no regional wall motion abnormalities. There is mild concentric left ventricular hypertrophy. Left ventricular diastolic parameters are indeterminate.  2. Right ventricular systolic function is normal. The right ventricular size is normal.  3. The mitral valve is normal in structure. No evidence of mitral valve regurgitation. No evidence of mitral stenosis.  4. The aortic valve is tricuspid. There is mild thickening of the aortic valve. Aortic valve regurgitation is mild to moderate. Aortic valve sclerosis/calcification is present, without any evidence of aortic stenosis.  5. There is mild dilatation of the ascending aorta, measuring 38 mm. There is moderate dilatation of the aortic root, measuring 41 mm.  6. The inferior vena cava is normal in size with greater than 50% respiratory variability, suggesting right atrial pressure of 3 mmHg. FINDINGS  Left Ventricle: Left ventricular ejection fraction, by estimation, is 55 to 60%. The left ventricle has normal function. The left ventricle has no regional wall motion abnormalities. The left ventricular internal cavity size was normal in size. There is  mild concentric left ventricular hypertrophy. Left ventricular diastolic parameters are indeterminate. Right Ventricle: The right ventricular size is normal. No increase in right ventricular wall thickness. Right ventricular systolic function is normal. Left Atrium: Left atrial size was normal in size. Right Atrium: Right atrial  size was normal in size. Pericardium: There is no evidence of pericardial effusion. Mitral Valve: The mitral valve is normal in structure. No evidence of mitral valve regurgitation. No evidence of mitral valve stenosis. Tricuspid Valve: The tricuspid valve is normal in structure. Tricuspid valve regurgitation is not demonstrated. No evidence of tricuspid stenosis. Aortic Valve: The aortic valve is tricuspid. There is mild thickening of the aortic valve. Aortic valve regurgitation is mild to moderate. Aortic regurgitation PHT measures 452  msec. Aortic valve sclerosis/calcification is present, without any evidence of aortic stenosis. Aortic valve peak gradient measures 8.6 mmHg. Pulmonic Valve: The pulmonic valve was normal in structure. Pulmonic valve regurgitation is not visualized. No evidence of pulmonic stenosis. Aorta: There is mild dilatation of the ascending aorta, measuring 38 mm. There is moderate dilatation of the aortic root, measuring 41 mm. Venous: The inferior vena cava is normal in size with greater than 50% respiratory variability, suggesting right atrial pressure of 3 mmHg. IAS/Shunts: No atrial level shunt detected by color flow Doppler. Agitated saline contrast was given intravenously to evaluate for intracardiac shunting.  LEFT VENTRICLE PLAX 2D LVIDd:         5.40 cm      Diastology LVIDs:         3.80 cm      LV e' medial:    7.46 cm/s LV PW:         1.10 cm      LV E/e' medial:  10.3 LV IVS:        1.30 cm      LV e' lateral:   7.77 cm/s LVOT diam:     2.30 cm      LV E/e' lateral: 9.9 LV SV:         101 LV SV Index:   42 LVOT Area:     4.15 cm  LV Volumes (MOD) LV vol d, MOD A2C: 115.0 ml LV vol d, MOD A4C: 144.0 ml LV vol s, MOD A2C: 48.6 ml LV vol s, MOD A4C: 60.4 ml LV SV MOD A2C:     66.4 ml LV SV MOD A4C:     144.0 ml LV SV MOD BP:      74.3 ml RIGHT VENTRICLE             IVC RV Basal diam:  3.30 cm     IVC diam: 1.80 cm RV S prime:     10.30 cm/s TAPSE (M-mode): 1.6 cm LEFT ATRIUM              Index        RIGHT ATRIUM           Index LA diam:        4.60 cm 1.92 cm/m   RA Area:     15.20 cm LA Vol (A2C):   67.3 ml 28.02 ml/m  RA Volume:   34.20 ml  14.24 ml/m LA Vol (A4C):   73.4 ml 30.56 ml/m LA Biplane Vol: 71.4 ml 29.73 ml/m  AORTIC VALVE AV Area (Vmax): 3.87 cm AV Vmax:        147.00 cm/s AV Peak Grad:   8.6 mmHg LVOT Vmax:      137.00 cm/s LVOT Vmean:     102.000 cm/s LVOT VTI:       0.242 m AI PHT:         452 msec  AORTA Ao Root diam: 4.10 cm Ao Asc diam:  3.80 cm MITRAL VALVE MV Area (PHT): 7.82 cm    SHUNTS MV Decel Time: 97 msec     Systemic VTI:  0.24 m MV E velocity: 76.60 cm/s  Systemic Diam: 2.30 cm MV A velocity: 57.20 cm/s MV E/A ratio:  1.34 Kardie Tobb DO Electronically signed by Berniece Salines DO Signature Date/Time: 12/09/2021/1:02:52 PM    Final    MR ANGIO HEAD WO CONTRAST  Result Date: 12/09/2021 CLINICAL DATA:  71 year old male with confusion, right side weakness. Left  MCA infarct on MRI. EXAM: MRA HEAD WITHOUT CONTRAST TECHNIQUE: Angiographic images of the Circle of Willis were acquired using MRA technique without intravenous contrast. COMPARISON:  Brain MRI and neck MRA today reported separately. FINDINGS: Anterior circulation: Antegrade flow in dolichoectatic ICA siphons. Infundibulum of the supraclinoid right ICA on series 5, image 112. The vessel measures up to 8 mm diameter. 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 Siphon irregularity in keeping with some atherosclerosis, but no significant siphon stenosis. Patent carotid termini, MCA and ACA origins. Tortuous and ectatic A1 segments measure up to 4 mm diameter. Diminutive anterior communicating artery. There is moderate distal ACA A2 segment stenosis on series 1038, image 1. Right MCA M1 segment bifurcates without stenosis. Right MCA branches are attack and mildly irregular. Left MCA M1 segment bifurcates early without stenosis. There is moderate to severe  attenuation of a posterior left MCA M2 branch on series 1044, image 1, probably series 5, image 140 of the source images. That branch may be occluded throughout. Other left MCA branches appear patent with mild irregularity. Posterior circulation: Antegrade flow in the posterior circulation with dolichoectatic vertebrobasilar system. Distal right vertebral artery is 6 mm diameter. Basilar artery is up to 8 mm diameter. Ectatic basilar tip but no discrete aneurysm. No stenosis. SCA and PCA origins are patent. Posterior communicating arteries are diminutive or absent. Bilateral PCA branches appear within normal limits. Anatomic variants: None significant. Other: Brain MRI today reported separately. IMPRESSION: 1. Positive for Left MCA posterior M2 branch poor flow or occlusion. 2. 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 Distal Right ICA and Basilar arteries are up to 8 mm diameter. 3. Evidence of superimposed intracranial atherosclerosis, including moderate bilateral distal ACA A2 segment stenosis. Electronically Signed   By: Genevie Ann M.D.   On: 12/09/2021 05:30   MR ANGIO NECK WO CONTRAST  Result Date: 12/09/2021 CLINICAL DATA:  71 year old male with confusion, right side weakness. Left MCA infarct on MRI. EXAM: MRA NECK WITHOUT CONTRAST TECHNIQUE: Angiographic images of the neck were acquired using MRA technique without intravenous contrast. Carotid stenosis measurements (when applicable) are obtained utilizing NASCET criteria, using the distal internal carotid diameter as the denominator. COMPARISON:  Brain MRI today reported separately. Neck CT 04/27/2018. FINDINGS: Aortic arch: Not well evaluated. Right carotid system: Antegrade flow. Visible right CCA is patent and mildly tortuous. Right carotid bifurcation appears capacious. Mildly tortuous cervical right ICA, no stenosis identified. Left carotid system:  Antegrade Flow. Similar tortuosity. No stenosis identified. Vertebral arteries: Fairly codominant and tortuous bilateral cervical vertebral arteries with antegrade flow signal to the skull base. No stenosis identified. 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 IMPRESSION: 1. Patent cervical carotid and vertebral arteries with tortuosity. No stenosis identified. 2. 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 Electronically Signed   By: Genevie Ann M.D.   On: 12/09/2021 05:17   MR BRAIN WO CONTRAST  Result Date: 12/09/2021 CLINICAL DATA:  71 year old male with confusion, right side weakness. EXAM: MRI HEAD WITHOUT CONTRAST TECHNIQUE: Multiplanar, multiecho pulse sequences of the brain and surrounding structures were obtained without intravenous contrast. COMPARISON:  Plain head CT 12/08/2021. FINDINGS: Brain: Confluent roughly 6 cm area of restricted diffusion in the posterior left MCA territory including lateral perirolandic cortex, post centri parietal lobe and the superior sensory strip. Ischemia also tracks into the  posterior left insula. See series 7, image 56). Early cytotoxic edema. No convincing acute hemorrhage. No other vascular territory ischemia. Patchy and confluent bilateral cerebral white matter T2 and FLAIR hyperintensity. Occasional chronic micro hemorrhages in the bilateral deep gray nuclei, white matter, right cerebellum. Brainstem and cerebellum are least affected. No cortical encephalomalacia is identified. No midline shift, mass effect, evidence of mass lesion, ventriculomegaly, extra-axial collection or acute intracranial hemorrhage. Cervicomedullary junction and pituitary are within normal limits. 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 See MRA today reported separately. Skull and upper cervical spine:  Visualized bone marrow signal is within normal limits. Some degenerative changes in the visible cervical spine. Sinuses/Orbits: Mildly Disconjugate gaze. Right maxillary mucous retention cyst but other paranasal Visualized paranasal sinuses and mastoids are stable and well aerated. Other: Grossly normal visible internal auditory structures. Negative visible scalp and face. IMPRESSION: 1. Acute posterior Left MCA territory infarct. No associated hemorrhage or mass effect. 2. 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 See MRA today reported separately. 3. 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 Electronically Signed   By: Genevie Ann M.D.   On: 12/09/2021 05:14   DG Pelvis 1-2 Views  Result Date: 12/09/2021 CLINICAL DATA:  Encounter for imaging to screen for metal prior to MRI. EXAM: PELVIS - 1-2 VIEW COMPARISON:  None Available. FINDINGS: There is no evidence of pelvic fracture or diastasis. Mild-to-moderate degenerative changes are noted at the hips bilaterally. No radiopaque foreign body. IMPRESSION: No radiopaque foreign body. Electronically Signed   By: Brett Fairy M.D.   On: 12/09/2021 04:31   DG Chest 1 View  Result Date: 12/09/2021 CLINICAL DATA:  Image discrete for metal prior to MRI. EXAM: CHEST  1 VIEW; ABDOMEN - 1 VIEW COMPARISON:  10/20/2019. FINDINGS: The heart is enlarged and the mediastinal contour stable. Minimal atelectasis is noted at the left lung base. No effusion or pneumothorax. No acute osseous abnormality. Nonobstructive bowel-gas pattern. Multiple surgical clips are noted in the epigastric and left upper quadrant regions. No unusual calcification. Degenerative changes are present in the lumbar spine. IMPRESSION: 1. Multiple surgical clips in the epigastric and left upper quadrant region. 2. Mild atelectasis at the left lung base. 3. Cardiomegaly. 4. Nonobstructive bowel-gas pattern. Electronically  Signed   By: Brett Fairy M.D.   On: 12/09/2021 04:27   DG Abd 1 View  Result Date: 12/09/2021 CLINICAL DATA:  Image discrete for metal prior to MRI. EXAM: CHEST  1 VIEW; ABDOMEN - 1 VIEW COMPARISON:  10/20/2019. FINDINGS: The heart is enlarged and the mediastinal contour stable. Minimal atelectasis is noted at the left lung base. No effusion or pneumothorax. No acute osseous abnormality. Nonobstructive bowel-gas pattern. Multiple surgical clips are noted in the epigastric and left upper quadrant regions. No unusual calcification. Degenerative changes are present in the lumbar spine. IMPRESSION: 1. Multiple surgical clips in the epigastric and left upper quadrant region. 2. Mild atelectasis at the left lung base. 3. Cardiomegaly. 4. Nonobstructive bowel-gas pattern. Electronically Signed   By: Brett Fairy M.D.   On: 12/09/2021 04:27   CT Head Wo Contrast  Result Date: 12/08/2021 CLINICAL DATA:  Altered mental status, confusion EXAM: CT HEAD WITHOUT CONTRAST TECHNIQUE: Contiguous axial images were obtained from the base of the skull through the vertex without intravenous contrast. RADIATION DOSE REDUCTION: This exam was performed according to the departmental dose-optimization  program which includes automated exposure control, adjustment of the mA and/or kV according to patient size and/or use of iterative reconstruction technique. COMPARISON:  CT head 05/22/2020 FINDINGS: Brain: There is no acute intracranial hemorrhage, extra-axial fluid collection, or acute infarct. Parenchymal volume is within normal limits for age. The ventricles are normal in size. Patchy and confluent hypodensity throughout the subcortical and periventricular white matter likely reflects sequela of chronic white matter microangiopathy. There is no mass lesion.  There is no mass effect or midline shift. Vascular: The intracranial ICAs and posterior circulation are ectatic with the left supraclinoid ICA measuring up to 7 mm in the  basilar artery measuring up to 7 mm, similar to the prior study. No dense vessel is seen. Skull: Normal. Negative for fracture or focal lesion. Sinuses/Orbits: The imaged paranasal sinuses are clear. The imaged globes and orbits are unremarkable. Other: None. IMPRESSION: 1. No acute intracranial pathology. Unchanged chronic white matter microangiopathy. 2. 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 Electronically Signed   By: Valetta Mole M.D.   On: 12/08/2021 19:33    Microbiology: Results for orders placed or performed during the hospital encounter of 12/08/21  Resp Panel by RT-PCR (Flu A&B, Covid) Anterior Nasal Swab     Status: None   Collection Time: 12/08/21  6:41 PM   Specimen: Anterior Nasal Swab  Result Value Ref Range Status   SARS Coronavirus 2 by RT PCR NEGATIVE NEGATIVE Final    Comment: (NOTE) SARS-CoV-2 target nucleic acids are NOT DETECTED.  The SARS-CoV-2 RNA is generally detectable in upper respiratory specimens during the acute phase of infection. The lowest concentration of SARS-CoV-2 viral copies this assay can detect is 138 copies/mL. A negative result does not preclude SARS-Cov-2 infection and should not be used as the sole basis for treatment or other patient management decisions. A negative result may occur with  improper specimen collection/handling, submission of specimen other than nasopharyngeal swab, presence of viral mutation(s) within the areas targeted by this assay, and inadequate number of viral copies(<138 copies/mL). A negative result must be combined with clinical observations, patient history, and epidemiological information. The expected result is Negative.  Fact Sheet for Patients:  EntrepreneurPulse.com.au  Fact Sheet for Healthcare Providers:  IncredibleEmployment.be  This test is no t yet approved or cleared by the [place] FDA and  has been authorized for detection and/or  diagnosis of SARS-CoV-2 by FDA under an Emergency Use Authorization (EUA). This EUA will remain  in effect (meaning this test can be used) for the duration of the COVID-19 declaration under Section 564(b)(1) of the Act, 21 U.S.C.section 360bbb-3(b)(1), unless the authorization is terminated  or revoked sooner.       Influenza A by PCR NEGATIVE NEGATIVE Final   Influenza B by PCR NEGATIVE NEGATIVE Final    Comment: (NOTE) The Xpert Xpress SARS-CoV-2/FLU/RSV plus assay is intended as an aid in the diagnosis of influenza from Nasopharyngeal swab specimens and should not be used as a sole basis for treatment. Nasal washings and aspirates are unacceptable for Xpert Xpress SARS-CoV-2/FLU/RSV testing.  Fact Sheet for Patients: EntrepreneurPulse.com.au  Fact Sheet for Healthcare Providers: IncredibleEmployment.be  This test is not yet approved or cleared by the [place] FDA and has been authorized for detection and/or diagnosis of SARS-CoV-2 by FDA under an Emergency Use Authorization (EUA). This EUA will remain in effect (meaning this test can be used) for the duration of the COVID-19 declaration under Section 564(b)(1) of the Act, 21 U.S.C. section 360bbb-3(b)(1),  unless the authorization is terminated or revoked.  Performed at [facility], [place] [address] [place], [place] [address]     Labs: CBC: Recent Labs  Lab 12/08/21 1841 12/08/21 1845 12/09/21 0507  WBC 6.4  --  5.9  NEUTROABS 1.8  --  2.5  HGB 15.4 15.6 15.6  HCT 45.5 46.0 47.0  MCV 87.7  --  86.4  PLT 282  --  629   Basic Metabolic Panel: Recent Labs  Lab 12/08/21 1841 12/08/21 1845 12/09/21 0507 12/11/21 0133 12/12/21 0433  NA 141 143 144 142 140  K 3.0* 3.1* 3.0* 3.3* 3.7  CL 103 101 104 104 103  CO2 29  --  '27 26 23  '$ GLUCOSE 98 93 82 103* 154*  BUN '20 18 15 18 22  '$ CREATININE 1.74* 2.30* 1.94* 2.20* 2.41*  CALCIUM 9.2  --  9.2 9.1 9.6   MG  --   --  1.8  --   --   PHOS  --   --  2.5  --   --    Liver Function Tests: Recent Labs  Lab 12/08/21 1841 12/09/21 0507 12/11/21 0133  AST '19 19 20  '$ ALT '15 15 11  '$ ALKPHOS 100 90 81  BILITOT 1.0 1.3* 1.5*  PROT 7.8 7.2 6.7  ALBUMIN 4.1 3.5 3.5   CBG: Recent Labs  Lab 12/08/21 1833  GLUCAP 83    Discharge time spent: greater than 30 minutes.  Signed: Patrecia Pour, MD [facility] 12/12/2021

## 2021-12-12 NOTE — TOC Transition Note (Signed)
Transition of Care [facility]) - CM/SW Discharge Note   Patient Details  Name: [name] MRN: [id number] Date of Birth: January 26, 1951  Transition [facility] Care [facility]) CM/SW Contact:  Amador Cunas, [place] Phone Number: 12/12/2021, 2:39 PM   Clinical Narrative: Pt for dc to [facility] today. Spoke to [place] in admissions who confirmed they are prepared to admit pt to room 1105B. Packet complete (RN to place paper Rx for tramadol in packet when signed) and RN provided with number for report. Pt's dtr aware of dc and reports agreeable. SW signing off at dc.   Wandra Feinstein, MSW, LCSW [phone] (coverage)      Final next level of care: Skilled Nursing Facility Barriers to Discharge: No Barriers Identified   Patient Goals and CMS Choice   CMS Medicare.gov Compare Post Acute Care list provided to:: Patient Represenative (must comment) Choice offered to / list presented to : Adult Children  Discharge Placement              Patient chooses bed at: [facility] Patient to be transferred to facility by: Theodore Name of family member notified: Amestine/dtr Patient and family notified of of transfer: 12/12/21  Discharge Plan and Services In-house Referral: Clinical Social Work Discharge Planning Services: CM Consult Post Acute Care Choice: [place]                               Social Determinants of Health (SDOH) Interventions     Readmission Risk Interventions    10/22/2019   12:04 PM  Readmission Risk Prevention Plan  Transportation Screening Complete  PCP or Specialist Appt within 3-5 Days Not Complete  Not Complete comments pending medical stability  HRI or Home Care Consult Complete  Social Work Consult for [place] Planning/Counseling Complete  Palliative Care Screening Not Applicable  Medication Review Press photographer) Referral to Pharmacy

## 2021-12-13 ENCOUNTER — Other Ambulatory Visit: Payer: Self-pay | Admitting: Physician Assistant

## 2021-12-13 DIAGNOSIS — I639 Cerebral infarction, unspecified: Secondary | ICD-10-CM

## 2021-12-14 ENCOUNTER — Other Ambulatory Visit: Payer: Self-pay | Admitting: Physician Assistant

## 2021-12-14 DIAGNOSIS — I639 Cerebral infarction, unspecified: Secondary | ICD-10-CM

## 2021-12-14 DIAGNOSIS — I480 Paroxysmal atrial fibrillation: Secondary | ICD-10-CM

## 2021-12-15 ENCOUNTER — Ambulatory Visit: Payer: Medicare Other | Admitting: Family Medicine

## 2021-12-15 DIAGNOSIS — I714 Abdominal aortic aneurysm, without rupture, unspecified: Secondary | ICD-10-CM | POA: Diagnosis not present

## 2021-12-15 DIAGNOSIS — M109 Gout, unspecified: Secondary | ICD-10-CM | POA: Diagnosis not present

## 2021-12-15 DIAGNOSIS — I5032 Chronic diastolic (congestive) heart failure: Secondary | ICD-10-CM | POA: Diagnosis not present

## 2021-12-15 DIAGNOSIS — N1832 Chronic kidney disease, stage 3b: Secondary | ICD-10-CM | POA: Diagnosis not present

## 2021-12-15 DIAGNOSIS — I6930 Unspecified sequelae of cerebral infarction: Secondary | ICD-10-CM | POA: Diagnosis not present

## 2021-12-15 DIAGNOSIS — R4701 Aphasia: Secondary | ICD-10-CM | POA: Diagnosis not present

## 2021-12-15 DIAGNOSIS — I679 Cerebrovascular disease, unspecified: Secondary | ICD-10-CM | POA: Diagnosis not present

## 2021-12-15 DIAGNOSIS — I77810 Thoracic aortic ectasia: Secondary | ICD-10-CM | POA: Diagnosis not present

## 2021-12-17 ENCOUNTER — Other Ambulatory Visit: Payer: Self-pay | Admitting: *Deleted

## 2021-12-17 DIAGNOSIS — I10 Essential (primary) hypertension: Secondary | ICD-10-CM | POA: Diagnosis not present

## 2021-12-17 NOTE — Patient Outreach (Signed)
Per [place] eligible member currently resides in [facility].  Screened for potential [facility] care coordination/care management services as a benefit of [name] insurance plan and PCP with [name]. [name] was active with [facility] services prior.   [name] admitted to SNF on 12/12/21 after hospitalization for CVA.   Facility site visit to [facility] skilled nursing facility. Met with SNF social workers and therapist. [name] is from home with roommate. [name] has aphasia and has some confusion. Family meeting was today. Daughter lives in [place]. Son lives locally. Therapy reports [name] has poor safety awareness with functional tasks. Will likely need LTC.  Will continue to follow.    Marthenia Rolling, MSN, RN,BSN [place] Acute Care Coordinator [phone] [facility]) [phone]  (Toll free office)

## 2021-12-18 ENCOUNTER — Ambulatory Visit (HOSPITAL_COMMUNITY): Payer: Medicare Other | Attending: Cardiology

## 2021-12-18 DIAGNOSIS — I5032 Chronic diastolic (congestive) heart failure: Secondary | ICD-10-CM | POA: Diagnosis not present

## 2021-12-18 DIAGNOSIS — I4510 Unspecified right bundle-branch block: Secondary | ICD-10-CM | POA: Insufficient documentation

## 2021-12-18 DIAGNOSIS — Z9981 Dependence on supplemental oxygen: Secondary | ICD-10-CM | POA: Diagnosis not present

## 2021-12-18 DIAGNOSIS — I351 Nonrheumatic aortic (valve) insufficiency: Secondary | ICD-10-CM | POA: Diagnosis not present

## 2021-12-18 DIAGNOSIS — M109 Gout, unspecified: Secondary | ICD-10-CM | POA: Diagnosis not present

## 2021-12-18 DIAGNOSIS — N1832 Chronic kidney disease, stage 3b: Secondary | ICD-10-CM | POA: Diagnosis not present

## 2021-12-18 LAB — ECHOCARDIOGRAM COMPLETE
Area-P 1/2: 2.56 cm2
P 1/2 time: 854 msec
S' Lateral: 4.1 cm

## 2021-12-22 DIAGNOSIS — I5032 Chronic diastolic (congestive) heart failure: Secondary | ICD-10-CM | POA: Diagnosis not present

## 2021-12-22 DIAGNOSIS — M109 Gout, unspecified: Secondary | ICD-10-CM | POA: Diagnosis not present

## 2021-12-22 DIAGNOSIS — R4701 Aphasia: Secondary | ICD-10-CM | POA: Diagnosis not present

## 2021-12-22 DIAGNOSIS — N1832 Chronic kidney disease, stage 3b: Secondary | ICD-10-CM | POA: Diagnosis not present

## 2021-12-22 DIAGNOSIS — R001 Bradycardia, unspecified: Secondary | ICD-10-CM | POA: Diagnosis not present

## 2021-12-24 ENCOUNTER — Other Ambulatory Visit: Payer: Self-pay | Admitting: *Deleted

## 2021-12-24 NOTE — Progress Notes (Signed)
[name] [address] [place] Phone: [phone] Subjective:   [name], am serving as a scribe for Dr. Hulan Saas.  I'm seeing this patient by the request  of:  Biagio Borg, MD  CC: knee pain   [id number]  10/01/2021 Chronic problem with exacerbation.  Discussed with patient again that I do feel that surgical intervention would be best.  Patient will still consider it.  I would like to avoid doing this too frequently if possible.  Patient will call us if any worsening pain again, patient knows during recent any worsening significant pain or swelling to seek medical attention immediately.  Updated 12/29/2021 [name] is a 71 y.o. male coming in with complaint of knee pain. Swelling in legs.  Patient has been in a long-term care facility after patient's stroke.  Reviewed discharge summary and patient does seem to have a CMET. Patient is having difficulty doing some walking secondary to the amount of pain in his knees.  Feels like that is more concerning with the decreased range of motion noted in the possible stroke.     Past Medical History:  Diagnosis Date   Anemia    normal Fe, nl B12, nl retic, nl EPO July '13   Aortic regurgitation    Moderate AI 04/17/19 echo   Blood transfusion without reported diagnosis    BPH (benign prostatic hyperplasia)    Chronic back pain    Chronic kidney disease    CKD III, obstructive nephropathy   Diverticulosis    Dysuria    Elevated PSA, greater than or equal to 20 ng/ml June '13   PSA 107   Hemorrhoids, internal, with bleeding, prolapse 09/19/2014   Hyperlipidemia 05/29/2014   Hypertension    Obstructive uropathy 11/27/2015   Pre-diabetes    per patient "reduced sugar intake"   Renal insufficiency    Tuberculosis    h/o PPD +   UTI (urinary tract infection)    Vitamin D deficiency 09/13/2017   Past Surgical History:  Procedure Laterality Date   CARPAL TUNNEL RELEASE  Right    COLONOSCOPY     HEMORRHOID BANDING     INSERTION OF MESH N/A 10/17/2019   Procedure: Insertion Of Mesh;  Surgeon: Ralene Ok, MD;  Location: [facility];  Service: General;  Laterality: N/A;   SPLENECTOMY     XI ROBOTIC ASSISTED SIMPLE PROSTATECTOMY N/A 03/09/2019   Procedure: XI ROBOTIC ASSISTED SIMPLE PROSTATECTOMY;  Surgeon: Cleon Gustin, MD;  Location: [facility];  Service: Urology;  Laterality: N/A;   XI ROBOTIC ASSISTED VENTRAL HERNIA N/A 10/17/2019   Procedure: XI ROBOTIC ASSISTED INCISIONAL HERNIA REPAIR WITH MESH;  Surgeon: Ralene Ok, MD;  Location: [facility];  Service: General;  Laterality: N/A;   Social History   Socioeconomic History   Marital status: Single    Spouse name: Not on file   Number of children: 2   Years of education: 20   Highest education level: Some college, no degree  Occupational History   Occupation: Lobbyist: [employer]    Comment: unemployed  Tobacco Use   Smoking status: Former    Types: Cigarettes    Quit date: 02/26/1980    Years since quitting: 41.8   Smokeless tobacco: Never  Vaping Use   Vaping Use: Never used  Substance and Sexual Activity   Alcohol use: No    Alcohol/week: 0.0 standard drinks of alcohol   Drug  use: No   Sexual activity: Not Currently  Other Topics Concern   Not on file  Social History Narrative   Native of [place], raised poor farming community. . To [place] '78 - finished College, all but Arts administrator. Married - wife in [place], blocked from [place] to [place]. 1 son '90; 1 dtr '93. 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 Resources depleted.   Right handed.   Caffeine Hot daily one daily.   Social Determinants of Health   Financial Resource Strain: Low Risk  (08/10/2021)   Overall Financial Resource Strain (CARDIA)    Difficulty of Paying Living Expenses: Not hard at all  Food Insecurity: No Food Insecurity (08/10/2021)   Hunger Vital Sign    Worried  About Running Out of Food in the Last Year: Never true    Ran Out of Food in the Last Year: Never true  Transportation Needs: No Transportation Needs (12/02/2021)   PRAPARE - Hydrologist (Medical): No    Lack of Transportation (Non-Medical): No  Physical Activity: Sufficiently Active (08/10/2021)   Exercise Vital Sign    Days of Exercise per Week: 5 days    Minutes of Exercise per Session: 30 min  Stress: No Stress Concern Present (08/10/2021)   [place]    Feeling of Stress : Not at all  Social Connections: Unknown (08/10/2021)   Social Connection and Isolation Panel [NHANES]    Frequency of Communication with Friends and Family: More than three times a week    Frequency of Social Gatherings with Friends and Family: Once a week    Attends Religious Services: Never    Marine scientist or Organizations: No    Attends Music therapist: Never    Marital Status: Not on file   No Known Allergies Family History  Problem Relation Age of Onset   Diabetes Mother    Stroke Brother    Hearing loss Brother    Colon cancer Neg Hx    Rectal cancer Neg Hx    Stomach cancer Neg Hx    Esophageal cancer Neg Hx     Current Outpatient Medications (Endocrine & Metabolic):    predniSONE (DELTASONE) 20 MG tablet, Take 2 tablets (40 mg total) by mouth daily with breakfast.  Current Outpatient Medications (Cardiovascular):    amLODipine (NORVASC) 10 MG tablet, Take 1 tablet (10 mg total) by mouth daily.   atorvastatin (LIPITOR) 80 MG tablet, Take 1 tablet (80 mg total) by mouth daily.   metoprolol tartrate (LOPRESSOR) 50 MG tablet, Take 1 tablet by mouth twice daily   Current Outpatient Medications (Analgesics):    acetaminophen (TYLENOL) 500 MG tablet, Take 1,000 mg by mouth every 6 (six) hours as needed for moderate pain.    allopurinol (ZYLOPRIM) 100 MG tablet, Take 2 tablets (200 mg  total) by mouth daily.   aspirin EC 81 MG tablet, Take 1 tablet (81 mg total) by mouth daily for 21 days.   colchicine 0.6 MG tablet, Take 1 tablet (0.6 mg total) by mouth daily.   traMADol (ULTRAM) 50 MG tablet, Take 1 tablet (50 mg total) by mouth every 12 (twelve) hours as needed for severe pain.  Current Outpatient Medications (Hematological):    clopidogrel (PLAVIX) 75 MG tablet, Take 1 tablet (75 mg total) by mouth daily.  Current Outpatient Medications (Other):    cholecalciferol (VITAMIN D3) 25 MCG (1000 UNIT) tablet,  Take 1,000 Units by mouth daily.   hydroxypropyl methylcellulose / hypromellose (ISOPTO TEARS / GONIOVISC) 2.5 % ophthalmic solution, Place 1-2 drops into both eyes 3 (three) times daily as needed (dry/irritated eyes.).   lactulose (CHRONULAC) 10 GM/15ML solution, TAKE 15 TO 30 MLS BY MOUTH ONCE DAILY (Patient taking differently: Take 10-20 g by mouth daily.)   meclizine (ANTIVERT) 25 MG tablet, Take 1 tablet (25 mg total) by mouth 2 (two) times daily as needed for up to 20 doses for dizziness.   Multiple Vitamin (MULTIVITAMIN WITH MINERALS) TABS tablet, Take 1 tablet by mouth daily.   Potassium 99 MG TABS, Take 99 mg by mouth daily.    Reviewed prior external information including notes and imaging from  primary care provider As well as notes that were available from care everywhere and other healthcare systems.  Reviewed notes from patient's cardiologist.  In addition to this on July 4 patient was in the hospital for strokelike symptoms.  Patient did have an MR angio of the head showing the patient did have some poor blood flow in the left MCA, patient did have an MRI of the brain showing an acute posterior left MCA infarct patient is now on Plavix.   Past medical history, social, surgical and family history all reviewed in electronic medical record.  No pertanent information unless stated regarding to the chief complaint.   Review of Systems:  No headache, visual  changes, nausea, vomiting, diarrhea, constipation, dizziness, abdominal pain, skin rash, fevers, chills, night sweats, weight loss, swollen lymph nodes, chest pain, shortness of breath, mood changes. POSITIVE muscle aches, body aches, joint swelling  Objective  Blood pressure 112/70, pulse 89, height '6\' 1"'$  (1.854 m), SpO2 94 %.   General: No apparent distress alert and oriented x3 mood and affect normal, dressed appropriately.  HEENT: Pupils equal, extraocular movements intact  Respiratory: Patient's speak in full sentences and does not appear short of breath patient does have some mild aphasia noted though that is different than his baseline.  Patient is wearing the nasal cannula but if the oxygen is off at the moment. Lower extremity swelling noted.  Patient's knees significant effusion noted on the right greater than left.  Patient does have instability noted right greater than left as well.   Limited muscular skeletal ultrasound was performed and interpreted by Hulan Saas, M  Limited ultrasound of patient's right knee and does have some swelling noted.  Seems to be more around the chronic synovitis noted.  No signs of any infectious etiology. 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 Right knee was prepped with alcohol swab and utilizing anterolateral approach, patient's right knee space was injected with 4:1  marcaine 0.5%: Kenalog '40mg'$ /dL. Patient tolerated the procedure well without immediate complications.  After informed written and verbal consent, patient was seated on exam table. Left knee was prepped with alcohol swab and utilizing anterolateral approach, patient's left knee space was injected with 4:1  marcaine 0.5%: Kenalog '40mg'$ /dL. Patient tolerated the procedure well without immediate complications.   Impression and Recommendations:    The above documentation has been reviewed and  is accurate and complete Lyndal Pulley, DO

## 2021-12-24 NOTE — Patient Outreach (Signed)
THN Post- Acute Care Coordinator follow up. Per [place] eligible member currently resides in [facility] Rehab SNF.  Screening  for potential [facility] care management/care coordination services a benefit of insurance plan and PCP with [employer] at [place]..  Facility site visit to [facility] skilled nursing facility. Met with [facility] SNF social workers and Verdis Frederickson, Marketing executive. Business office is working with daughter that lives in [place] with applying for [place]. Has son that lives locally but has not been involved with care. Continues to make progress with therapy. Has aphasia. Will need long term care.   Will continue to follow.   Marthenia Rolling, MSN, RN,BSN [place] Acute Care Coordinator [phone] [facility]) [phone]  (Toll free office)

## 2021-12-25 ENCOUNTER — Encounter: Payer: Self-pay | Admitting: Cardiovascular Disease

## 2021-12-29 ENCOUNTER — Ambulatory Visit: Payer: Self-pay

## 2021-12-29 ENCOUNTER — Encounter: Payer: Self-pay | Admitting: Family Medicine

## 2021-12-29 ENCOUNTER — Ambulatory Visit (INDEPENDENT_AMBULATORY_CARE_PROVIDER_SITE_OTHER): Payer: Medicare Other | Admitting: Family Medicine

## 2021-12-29 VITALS — BP 112/70 | HR 89 | Ht 73.0 in

## 2021-12-29 DIAGNOSIS — M25561 Pain in right knee: Secondary | ICD-10-CM

## 2021-12-29 DIAGNOSIS — M170 Bilateral primary osteoarthritis of knee: Secondary | ICD-10-CM

## 2021-12-29 DIAGNOSIS — G8929 Other chronic pain: Secondary | ICD-10-CM

## 2021-12-29 DIAGNOSIS — I639 Cerebral infarction, unspecified: Secondary | ICD-10-CM | POA: Diagnosis not present

## 2021-12-29 DIAGNOSIS — M2550 Pain in unspecified joint: Secondary | ICD-10-CM | POA: Diagnosis not present

## 2021-12-29 LAB — COMPREHENSIVE METABOLIC PANEL
ALT: 17 U/L (ref 0–53)
AST: 20 U/L (ref 0–37)
Albumin: 3.3 g/dL — ABNORMAL LOW (ref 3.5–5.2)
Alkaline Phosphatase: 94 U/L (ref 39–117)
BUN: 21 mg/dL (ref 6–23)
CO2: 33 mEq/L — ABNORMAL HIGH (ref 19–32)
Calcium: 8.6 mg/dL (ref 8.4–10.5)
Chloride: 101 mEq/L (ref 96–112)
Creatinine, Ser: 1.96 mg/dL — ABNORMAL HIGH (ref 0.40–1.50)
GFR: 33.77 mL/min — ABNORMAL LOW (ref >60.00)
Glucose, Bld: 163 mg/dL — ABNORMAL HIGH (ref 70–99)
Potassium: 3.5 mEq/L (ref 3.5–5.1)
Sodium: 141 mEq/L (ref 135–145)
Total Bilirubin: 1.3 mg/dL — ABNORMAL HIGH (ref 0.2–1.2)
Total Protein: 6.5 g/dL (ref 6.0–8.3)

## 2021-12-29 NOTE — Assessment & Plan Note (Signed)
Patient is to be following up with neurology in the near future.  In a long-term care facility.  Possibly moving to [place].

## 2021-12-29 NOTE — Assessment & Plan Note (Signed)
Bilateral knees injected today.  Chronic problem with exacerbation.  Patient does have fairly significant aphasia.  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  Will get CMET to further evaluate blood glucose, kidney function as well.  Follow-up with me again in 2 to 3 months possibility that patient may be moving

## 2021-12-29 NOTE — Patient Instructions (Signed)
Labs today Follow up with Neurology Injections in both knees today See you again in 8 weeks

## 2021-12-31 ENCOUNTER — Other Ambulatory Visit: Payer: Self-pay | Admitting: *Deleted

## 2021-12-31 DIAGNOSIS — Z9981 Dependence on supplemental oxygen: Secondary | ICD-10-CM | POA: Diagnosis not present

## 2021-12-31 DIAGNOSIS — I679 Cerebrovascular disease, unspecified: Secondary | ICD-10-CM | POA: Diagnosis not present

## 2021-12-31 NOTE — Patient Outreach (Addendum)
THN Post- Acute Care Coordinator follow up. Per [place] eligible member currently resides in  [facility] SN[facility].  Screening for potential [facility] care coordination/care management services as a benefit of [employer] plan and PCP with [facility].   Met with Kirstin and Irine Seal, SNF social workers at [facility]. Mr. Ates last covered day is today. Lost appeal. His daughter has applied for Medicaid. Will likely remain LTC. Resident remains confused.   No identifiable THN needs at this time.    Marthenia Rolling, MSN, RN,BSN [place] Acute Care Coordinator [phone] [facility]) [phone]  (Toll free office)

## 2022-01-04 DIAGNOSIS — I639 Cerebral infarction, unspecified: Secondary | ICD-10-CM | POA: Diagnosis not present

## 2022-01-04 DIAGNOSIS — R2689 Other abnormalities of gait and mobility: Secondary | ICD-10-CM | POA: Diagnosis not present

## 2022-01-04 DIAGNOSIS — M6281 Muscle weakness (generalized): Secondary | ICD-10-CM | POA: Diagnosis not present

## 2022-01-04 DIAGNOSIS — R262 Difficulty in walking, not elsewhere classified: Secondary | ICD-10-CM | POA: Diagnosis not present

## 2022-01-05 DIAGNOSIS — Z9981 Dependence on supplemental oxygen: Secondary | ICD-10-CM | POA: Diagnosis not present

## 2022-01-05 DIAGNOSIS — N1832 Chronic kidney disease, stage 3b: Secondary | ICD-10-CM | POA: Diagnosis not present

## 2022-01-05 DIAGNOSIS — I6930 Unspecified sequelae of cerebral infarction: Secondary | ICD-10-CM | POA: Diagnosis not present

## 2022-01-05 DIAGNOSIS — I130 Hypertensive heart and chronic kidney disease with heart failure and stage 1 through stage 4 chronic kidney disease, or unspecified chronic kidney disease: Secondary | ICD-10-CM | POA: Diagnosis not present

## 2022-01-05 DIAGNOSIS — I509 Heart failure, unspecified: Secondary | ICD-10-CM | POA: Diagnosis not present

## 2022-01-05 DIAGNOSIS — M109 Gout, unspecified: Secondary | ICD-10-CM | POA: Diagnosis not present

## 2022-01-06 DIAGNOSIS — I639 Cerebral infarction, unspecified: Secondary | ICD-10-CM | POA: Diagnosis not present

## 2022-01-06 DIAGNOSIS — R262 Difficulty in walking, not elsewhere classified: Secondary | ICD-10-CM | POA: Diagnosis not present

## 2022-01-06 DIAGNOSIS — R2689 Other abnormalities of gait and mobility: Secondary | ICD-10-CM | POA: Diagnosis not present

## 2022-01-06 DIAGNOSIS — M6281 Muscle weakness (generalized): Secondary | ICD-10-CM | POA: Diagnosis not present

## 2022-01-07 ENCOUNTER — Other Ambulatory Visit: Payer: Self-pay | Admitting: *Deleted

## 2022-01-07 DIAGNOSIS — M6281 Muscle weakness (generalized): Secondary | ICD-10-CM | POA: Diagnosis not present

## 2022-01-07 DIAGNOSIS — I639 Cerebral infarction, unspecified: Secondary | ICD-10-CM | POA: Diagnosis not present

## 2022-01-07 DIAGNOSIS — R262 Difficulty in walking, not elsewhere classified: Secondary | ICD-10-CM | POA: Diagnosis not present

## 2022-01-07 DIAGNOSIS — R2689 Other abnormalities of gait and mobility: Secondary | ICD-10-CM | POA: Diagnosis not present

## 2022-01-07 NOTE — Patient Outreach (Signed)
[place] Coordinator follow up.   Facility site visit to [facility] and [place]. Met with Kirstin, SNF SW who reports Mr. Bierlein has transitioned to LTC under Medicaid pending.  No identifiable care coordinaton/care management services.    Marthenia Rolling, MSN, RN,BSN [place] Acute Care Coordinator [phone] [facility]) [phone]  (Toll free office)

## 2022-01-08 ENCOUNTER — Telehealth: Payer: Self-pay

## 2022-01-08 DIAGNOSIS — R262 Difficulty in walking, not elsewhere classified: Secondary | ICD-10-CM | POA: Diagnosis not present

## 2022-01-08 DIAGNOSIS — M6281 Muscle weakness (generalized): Secondary | ICD-10-CM | POA: Diagnosis not present

## 2022-01-08 DIAGNOSIS — I110 Hypertensive heart disease with heart failure: Secondary | ICD-10-CM | POA: Diagnosis not present

## 2022-01-08 DIAGNOSIS — I679 Cerebrovascular disease, unspecified: Secondary | ICD-10-CM | POA: Diagnosis not present

## 2022-01-08 DIAGNOSIS — G8191 Hemiplegia, unspecified affecting right dominant side: Secondary | ICD-10-CM | POA: Diagnosis not present

## 2022-01-08 DIAGNOSIS — I351 Nonrheumatic aortic (valve) insufficiency: Secondary | ICD-10-CM

## 2022-01-08 DIAGNOSIS — R2689 Other abnormalities of gait and mobility: Secondary | ICD-10-CM | POA: Diagnosis not present

## 2022-01-08 DIAGNOSIS — Z9981 Dependence on supplemental oxygen: Secondary | ICD-10-CM | POA: Diagnosis not present

## 2022-01-08 DIAGNOSIS — N1832 Chronic kidney disease, stage 3b: Secondary | ICD-10-CM | POA: Diagnosis not present

## 2022-01-08 DIAGNOSIS — I639 Cerebral infarction, unspecified: Secondary | ICD-10-CM | POA: Diagnosis not present

## 2022-01-08 DIAGNOSIS — R109 Unspecified abdominal pain: Secondary | ICD-10-CM | POA: Diagnosis not present

## 2022-01-08 NOTE — Telephone Encounter (Signed)
Still have not reached patient. Letter was mailed asking him to call office. Order placed at this time for cardiac MRI.

## 2022-01-08 NOTE — Telephone Encounter (Signed)
-----   Message from Thayer Headings, MD sent at 12/18/2021  5:54 PM EDT ----- Normal left ventricular systolic function.  We were not able to assess diastolic function Mildly elevated pulmonary pressure at 41 mmHg Moderate aortic valve regurgitation Mild aortic dilatation  Dr. Nechama Guard recommends cardiac MRI to quantitate the aortic insufficiency

## 2022-01-10 DIAGNOSIS — R262 Difficulty in walking, not elsewhere classified: Secondary | ICD-10-CM | POA: Diagnosis not present

## 2022-01-10 DIAGNOSIS — I639 Cerebral infarction, unspecified: Secondary | ICD-10-CM | POA: Diagnosis not present

## 2022-01-10 DIAGNOSIS — M6281 Muscle weakness (generalized): Secondary | ICD-10-CM | POA: Diagnosis not present

## 2022-01-10 DIAGNOSIS — R2689 Other abnormalities of gait and mobility: Secondary | ICD-10-CM | POA: Diagnosis not present

## 2022-01-11 DIAGNOSIS — R52 Pain, unspecified: Secondary | ICD-10-CM | POA: Diagnosis not present

## 2022-01-11 DIAGNOSIS — I110 Hypertensive heart disease with heart failure: Secondary | ICD-10-CM | POA: Diagnosis not present

## 2022-01-11 DIAGNOSIS — R609 Edema, unspecified: Secondary | ICD-10-CM | POA: Diagnosis not present

## 2022-01-11 DIAGNOSIS — I5032 Chronic diastolic (congestive) heart failure: Secondary | ICD-10-CM | POA: Diagnosis not present

## 2022-01-11 DIAGNOSIS — M79601 Pain in right arm: Secondary | ICD-10-CM | POA: Diagnosis not present

## 2022-01-11 DIAGNOSIS — M79606 Pain in leg, unspecified: Secondary | ICD-10-CM | POA: Diagnosis not present

## 2022-01-11 DIAGNOSIS — M109 Gout, unspecified: Secondary | ICD-10-CM | POA: Diagnosis not present

## 2022-01-12 DIAGNOSIS — M6281 Muscle weakness (generalized): Secondary | ICD-10-CM | POA: Diagnosis not present

## 2022-01-12 DIAGNOSIS — R2689 Other abnormalities of gait and mobility: Secondary | ICD-10-CM | POA: Diagnosis not present

## 2022-01-12 DIAGNOSIS — R262 Difficulty in walking, not elsewhere classified: Secondary | ICD-10-CM | POA: Diagnosis not present

## 2022-01-12 DIAGNOSIS — E119 Type 2 diabetes mellitus without complications: Secondary | ICD-10-CM | POA: Diagnosis not present

## 2022-01-12 DIAGNOSIS — I639 Cerebral infarction, unspecified: Secondary | ICD-10-CM | POA: Diagnosis not present

## 2022-01-13 ENCOUNTER — Emergency Department (HOSPITAL_COMMUNITY): Payer: Medicare Other

## 2022-01-13 ENCOUNTER — Other Ambulatory Visit: Payer: Self-pay

## 2022-01-13 ENCOUNTER — Inpatient Hospital Stay (HOSPITAL_COMMUNITY)
Admission: EM | Admit: 2022-01-13 | Discharge: 2022-01-27 | DRG: 871 | Disposition: A | Discharge status: Skilled Nursing Facility | Payer: Medicare Other | Attending: Internal Medicine | Admitting: Internal Medicine

## 2022-01-13 ENCOUNTER — Encounter (HOSPITAL_COMMUNITY): Payer: Self-pay | Admitting: Family Medicine

## 2022-01-13 DIAGNOSIS — M6281 Muscle weakness (generalized): Secondary | ICD-10-CM | POA: Diagnosis not present

## 2022-01-13 DIAGNOSIS — R509 Fever, unspecified: Principal | ICD-10-CM

## 2022-01-13 DIAGNOSIS — K648 Other hemorrhoids: Secondary | ICD-10-CM | POA: Diagnosis present

## 2022-01-13 DIAGNOSIS — R609 Edema, unspecified: Secondary | ICD-10-CM | POA: Diagnosis not present

## 2022-01-13 DIAGNOSIS — R339 Retention of urine, unspecified: Secondary | ICD-10-CM | POA: Diagnosis not present

## 2022-01-13 DIAGNOSIS — R109 Unspecified abdominal pain: Secondary | ICD-10-CM | POA: Diagnosis not present

## 2022-01-13 DIAGNOSIS — E861 Hypovolemia: Secondary | ICD-10-CM | POA: Diagnosis not present

## 2022-01-13 DIAGNOSIS — M7989 Other specified soft tissue disorders: Secondary | ICD-10-CM | POA: Diagnosis not present

## 2022-01-13 DIAGNOSIS — G8929 Other chronic pain: Secondary | ICD-10-CM | POA: Diagnosis present

## 2022-01-13 DIAGNOSIS — I714 Abdominal aortic aneurysm, without rupture, unspecified: Secondary | ICD-10-CM | POA: Diagnosis present

## 2022-01-13 DIAGNOSIS — M109 Gout, unspecified: Secondary | ICD-10-CM | POA: Diagnosis present

## 2022-01-13 DIAGNOSIS — E669 Obesity, unspecified: Secondary | ICD-10-CM | POA: Diagnosis present

## 2022-01-13 DIAGNOSIS — Z9081 Acquired absence of spleen: Secondary | ICD-10-CM

## 2022-01-13 DIAGNOSIS — I7123 Aneurysm of the descending thoracic aorta, without rupture: Secondary | ICD-10-CM | POA: Diagnosis not present

## 2022-01-13 DIAGNOSIS — Z7902 Long term (current) use of antithrombotics/antiplatelets: Secondary | ICD-10-CM

## 2022-01-13 DIAGNOSIS — Z87891 Personal history of nicotine dependence: Secondary | ICD-10-CM

## 2022-01-13 DIAGNOSIS — R1310 Dysphagia, unspecified: Secondary | ICD-10-CM | POA: Diagnosis not present

## 2022-01-13 DIAGNOSIS — L0201 Cutaneous abscess of face: Secondary | ICD-10-CM | POA: Diagnosis not present

## 2022-01-13 DIAGNOSIS — K921 Melena: Secondary | ICD-10-CM | POA: Diagnosis not present

## 2022-01-13 DIAGNOSIS — I358 Other nonrheumatic aortic valve disorders: Secondary | ICD-10-CM | POA: Diagnosis present

## 2022-01-13 DIAGNOSIS — R338 Other retention of urine: Secondary | ICD-10-CM | POA: Diagnosis present

## 2022-01-13 DIAGNOSIS — D72829 Elevated white blood cell count, unspecified: Secondary | ICD-10-CM

## 2022-01-13 DIAGNOSIS — R400 Somnolence: Secondary | ICD-10-CM | POA: Diagnosis not present

## 2022-01-13 DIAGNOSIS — Z8673 Personal history of transient ischemic attack (TIA), and cerebral infarction without residual deficits: Secondary | ICD-10-CM

## 2022-01-13 DIAGNOSIS — R262 Difficulty in walking, not elsewhere classified: Secondary | ICD-10-CM | POA: Diagnosis not present

## 2022-01-13 DIAGNOSIS — I130 Hypertensive heart and chronic kidney disease with heart failure and stage 1 through stage 4 chronic kidney disease, or unspecified chronic kidney disease: Secondary | ICD-10-CM | POA: Diagnosis not present

## 2022-01-13 DIAGNOSIS — I471 Supraventricular tachycardia: Secondary | ICD-10-CM | POA: Diagnosis not present

## 2022-01-13 DIAGNOSIS — Z7952 Long term (current) use of systemic steroids: Secondary | ICD-10-CM

## 2022-01-13 DIAGNOSIS — K6389 Other specified diseases of intestine: Secondary | ICD-10-CM | POA: Diagnosis not present

## 2022-01-13 DIAGNOSIS — Z8546 Personal history of malignant neoplasm of prostate: Secondary | ICD-10-CM

## 2022-01-13 DIAGNOSIS — R2681 Unsteadiness on feet: Secondary | ICD-10-CM | POA: Diagnosis not present

## 2022-01-13 DIAGNOSIS — N139 Obstructive and reflux uropathy, unspecified: Secondary | ICD-10-CM | POA: Diagnosis not present

## 2022-01-13 DIAGNOSIS — R6889 Other general symptoms and signs: Secondary | ICD-10-CM | POA: Diagnosis not present

## 2022-01-13 DIAGNOSIS — I4729 Other ventricular tachycardia: Secondary | ICD-10-CM | POA: Diagnosis not present

## 2022-01-13 DIAGNOSIS — B999 Unspecified infectious disease: Secondary | ICD-10-CM | POA: Diagnosis not present

## 2022-01-13 DIAGNOSIS — I700 Atherosclerosis of aorta: Secondary | ICD-10-CM | POA: Diagnosis not present

## 2022-01-13 DIAGNOSIS — N138 Other obstructive and reflux uropathy: Secondary | ICD-10-CM | POA: Diagnosis not present

## 2022-01-13 DIAGNOSIS — J159 Unspecified bacterial pneumonia: Secondary | ICD-10-CM | POA: Diagnosis present

## 2022-01-13 DIAGNOSIS — A419 Sepsis, unspecified organism: Secondary | ICD-10-CM | POA: Diagnosis not present

## 2022-01-13 DIAGNOSIS — I69320 Aphasia following cerebral infarction: Secondary | ICD-10-CM

## 2022-01-13 DIAGNOSIS — R140 Abdominal distension (gaseous): Secondary | ICD-10-CM | POA: Diagnosis not present

## 2022-01-13 DIAGNOSIS — K5731 Diverticulosis of large intestine without perforation or abscess with bleeding: Secondary | ICD-10-CM | POA: Diagnosis present

## 2022-01-13 DIAGNOSIS — R410 Disorientation, unspecified: Secondary | ICD-10-CM

## 2022-01-13 DIAGNOSIS — G459 Transient cerebral ischemic attack, unspecified: Secondary | ICD-10-CM | POA: Diagnosis not present

## 2022-01-13 DIAGNOSIS — K5730 Diverticulosis of large intestine without perforation or abscess without bleeding: Secondary | ICD-10-CM | POA: Diagnosis not present

## 2022-01-13 DIAGNOSIS — K625 Hemorrhage of anus and rectum: Secondary | ICD-10-CM | POA: Diagnosis not present

## 2022-01-13 DIAGNOSIS — K567 Ileus, unspecified: Secondary | ICD-10-CM | POA: Diagnosis not present

## 2022-01-13 DIAGNOSIS — I639 Cerebral infarction, unspecified: Secondary | ICD-10-CM | POA: Diagnosis not present

## 2022-01-13 DIAGNOSIS — G9340 Encephalopathy, unspecified: Secondary | ICD-10-CM | POA: Diagnosis not present

## 2022-01-13 DIAGNOSIS — N1832 Chronic kidney disease, stage 3b: Secondary | ICD-10-CM | POA: Diagnosis not present

## 2022-01-13 DIAGNOSIS — I5032 Chronic diastolic (congestive) heart failure: Secondary | ICD-10-CM | POA: Diagnosis present

## 2022-01-13 DIAGNOSIS — E876 Hypokalemia: Secondary | ICD-10-CM | POA: Diagnosis present

## 2022-01-13 DIAGNOSIS — Z20822 Contact with and (suspected) exposure to covid-19: Secondary | ICD-10-CM | POA: Diagnosis present

## 2022-01-13 DIAGNOSIS — Z823 Family history of stroke: Secondary | ICD-10-CM

## 2022-01-13 DIAGNOSIS — Z833 Family history of diabetes mellitus: Secondary | ICD-10-CM

## 2022-01-13 DIAGNOSIS — E785 Hyperlipidemia, unspecified: Secondary | ICD-10-CM | POA: Diagnosis not present

## 2022-01-13 DIAGNOSIS — Z7401 Bed confinement status: Secondary | ICD-10-CM | POA: Diagnosis not present

## 2022-01-13 DIAGNOSIS — R4182 Altered mental status, unspecified: Secondary | ICD-10-CM | POA: Diagnosis present

## 2022-01-13 DIAGNOSIS — I77810 Thoracic aortic ectasia: Secondary | ICD-10-CM | POA: Diagnosis not present

## 2022-01-13 DIAGNOSIS — G9341 Metabolic encephalopathy: Secondary | ICD-10-CM | POA: Diagnosis present

## 2022-01-13 DIAGNOSIS — R6520 Severe sepsis without septic shock: Secondary | ICD-10-CM | POA: Diagnosis not present

## 2022-01-13 DIAGNOSIS — D631 Anemia in chronic kidney disease: Secondary | ICD-10-CM | POA: Diagnosis not present

## 2022-01-13 DIAGNOSIS — R000 Tachycardia, unspecified: Secondary | ICD-10-CM | POA: Diagnosis not present

## 2022-01-13 DIAGNOSIS — M25462 Effusion, left knee: Secondary | ICD-10-CM | POA: Diagnosis not present

## 2022-01-13 DIAGNOSIS — Z743 Need for continuous supervision: Secondary | ICD-10-CM | POA: Diagnosis not present

## 2022-01-13 DIAGNOSIS — I5033 Acute on chronic diastolic (congestive) heart failure: Secondary | ICD-10-CM | POA: Diagnosis present

## 2022-01-13 DIAGNOSIS — N179 Acute kidney failure, unspecified: Secondary | ICD-10-CM | POA: Diagnosis present

## 2022-01-13 DIAGNOSIS — M25562 Pain in left knee: Secondary | ICD-10-CM | POA: Diagnosis not present

## 2022-01-13 DIAGNOSIS — N400 Enlarged prostate without lower urinary tract symptoms: Secondary | ICD-10-CM | POA: Diagnosis present

## 2022-01-13 DIAGNOSIS — I10 Essential (primary) hypertension: Secondary | ICD-10-CM | POA: Diagnosis not present

## 2022-01-13 DIAGNOSIS — R4701 Aphasia: Secondary | ICD-10-CM

## 2022-01-13 DIAGNOSIS — N261 Atrophy of kidney (terminal): Secondary | ICD-10-CM | POA: Diagnosis not present

## 2022-01-13 DIAGNOSIS — N390 Urinary tract infection, site not specified: Secondary | ICD-10-CM | POA: Diagnosis present

## 2022-01-13 DIAGNOSIS — I69351 Hemiplegia and hemiparesis following cerebral infarction affecting right dominant side: Secondary | ICD-10-CM

## 2022-01-13 DIAGNOSIS — R3914 Feeling of incomplete bladder emptying: Secondary | ICD-10-CM

## 2022-01-13 DIAGNOSIS — R2689 Other abnormalities of gait and mobility: Secondary | ICD-10-CM | POA: Diagnosis not present

## 2022-01-13 DIAGNOSIS — N401 Enlarged prostate with lower urinary tract symptoms: Secondary | ICD-10-CM | POA: Diagnosis not present

## 2022-01-13 DIAGNOSIS — N281 Cyst of kidney, acquired: Secondary | ICD-10-CM | POA: Diagnosis not present

## 2022-01-13 DIAGNOSIS — J9811 Atelectasis: Secondary | ICD-10-CM | POA: Diagnosis present

## 2022-01-13 DIAGNOSIS — R0689 Other abnormalities of breathing: Secondary | ICD-10-CM | POA: Diagnosis not present

## 2022-01-13 DIAGNOSIS — Z79899 Other long term (current) drug therapy: Secondary | ICD-10-CM

## 2022-01-13 DIAGNOSIS — R0600 Dyspnea, unspecified: Secondary | ICD-10-CM | POA: Diagnosis not present

## 2022-01-13 DIAGNOSIS — Z6831 Body mass index (BMI) 31.0-31.9, adult: Secondary | ICD-10-CM

## 2022-01-13 DIAGNOSIS — K529 Noninfective gastroenteritis and colitis, unspecified: Secondary | ICD-10-CM | POA: Diagnosis present

## 2022-01-13 DIAGNOSIS — R5381 Other malaise: Secondary | ICD-10-CM

## 2022-01-13 LAB — CBC
HCT: 37.6 % — ABNORMAL LOW (ref 39.0–52.0)
Hemoglobin: 12.4 g/dL — ABNORMAL LOW (ref 13.0–17.0)
MCH: 29.1 pg (ref 26.0–34.0)
MCHC: 33 g/dL (ref 30.0–36.0)
MCV: 88.3 fL (ref 80.0–100.0)
Platelets: 245 10*3/uL (ref 150–400)
RBC: 4.26 MIL/uL (ref 4.22–5.81)
RDW: 15 % (ref 11.5–15.5)
WBC: 13.2 10*3/uL — ABNORMAL HIGH (ref 4.0–10.5)
nRBC: 0 % (ref 0.0–0.2)

## 2022-01-13 LAB — BASIC METABOLIC PANEL
Anion gap: 11 (ref 5–15)
BUN: 27 mg/dL — ABNORMAL HIGH (ref 8–23)
CO2: 25 mmol/L (ref 22–32)
Calcium: 8.3 mg/dL — ABNORMAL LOW (ref 8.9–10.3)
Chloride: 103 mmol/L (ref 98–111)
Creatinine, Ser: 2.83 mg/dL — ABNORMAL HIGH (ref 0.61–1.24)
GFR, Estimated: 23 mL/min — ABNORMAL LOW (ref >60)
Glucose, Bld: 185 mg/dL — ABNORMAL HIGH (ref 70–99)
Potassium: 3.2 mmol/L — ABNORMAL LOW (ref 3.5–5.1)
Sodium: 139 mmol/L (ref 135–145)

## 2022-01-13 LAB — CBC WITH DIFFERENTIAL/PLATELET
Abs Immature Granulocytes: 0.08 10*3/uL — ABNORMAL HIGH (ref 0.00–0.07)
Basophils Absolute: 0 10*3/uL (ref 0.0–0.1)
Basophils Relative: 0 %
Eosinophils Absolute: 0 10*3/uL (ref 0.0–0.5)
Eosinophils Relative: 0 %
HCT: 39.6 % (ref 39.0–52.0)
Hemoglobin: 13.1 g/dL (ref 13.0–17.0)
Immature Granulocytes: 1 %
Lymphocytes Relative: 8 %
Lymphs Abs: 1.1 10*3/uL (ref 0.7–4.0)
MCH: 29.1 pg (ref 26.0–34.0)
MCHC: 33.1 g/dL (ref 30.0–36.0)
MCV: 88 fL (ref 80.0–100.0)
Monocytes Absolute: 0.9 10*3/uL (ref 0.1–1.0)
Monocytes Relative: 6 %
Neutro Abs: 11.6 10*3/uL — ABNORMAL HIGH (ref 1.7–7.7)
Neutrophils Relative %: 85 %
Platelets: 251 10*3/uL (ref 150–400)
RBC: 4.5 MIL/uL (ref 4.22–5.81)
RDW: 15 % (ref 11.5–15.5)
WBC: 13.7 10*3/uL — ABNORMAL HIGH (ref 4.0–10.5)
nRBC: 0 % (ref 0.0–0.2)

## 2022-01-13 LAB — COMPREHENSIVE METABOLIC PANEL
ALT: 21 U/L (ref 0–44)
AST: 33 U/L (ref 15–41)
Albumin: 3.1 g/dL — ABNORMAL LOW (ref 3.5–5.0)
Alkaline Phosphatase: 93 U/L (ref 38–126)
Anion gap: 11 (ref 5–15)
BUN: 27 mg/dL — ABNORMAL HIGH (ref 8–23)
CO2: 25 mmol/L (ref 22–32)
Calcium: 8.5 mg/dL — ABNORMAL LOW (ref 8.9–10.3)
Chloride: 102 mmol/L (ref 98–111)
Creatinine, Ser: 2.82 mg/dL — ABNORMAL HIGH (ref 0.61–1.24)
GFR, Estimated: 23 mL/min — ABNORMAL LOW (ref >60)
Glucose, Bld: 205 mg/dL — ABNORMAL HIGH (ref 70–99)
Potassium: 2.9 mmol/L — ABNORMAL LOW (ref 3.5–5.1)
Sodium: 138 mmol/L (ref 135–145)
Total Bilirubin: 1.9 mg/dL — ABNORMAL HIGH (ref 0.3–1.2)
Total Protein: 6.9 g/dL (ref 6.5–8.1)

## 2022-01-13 LAB — URINALYSIS, ROUTINE W REFLEX MICROSCOPIC
Bilirubin Urine: NEGATIVE
Glucose, UA: NEGATIVE mg/dL
Hgb urine dipstick: NEGATIVE
Ketones, ur: NEGATIVE mg/dL
Nitrite: NEGATIVE
Protein, ur: 30 mg/dL — AB
Specific Gravity, Urine: 1.013 (ref 1.005–1.030)
pH: 5 (ref 5.0–8.0)

## 2022-01-13 LAB — MAGNESIUM: Magnesium: 1.8 mg/dL (ref 1.7–2.4)

## 2022-01-13 LAB — AMMONIA: Ammonia: <10 umol/L (ref 9–35)

## 2022-01-13 LAB — PROTIME-INR
INR: 1.2 (ref 0.8–1.2)
Prothrombin Time: 15.5 seconds — ABNORMAL HIGH (ref 11.4–15.2)

## 2022-01-13 LAB — LACTIC ACID, PLASMA
Lactic Acid, Venous: 1.6 mmol/L (ref 0.5–1.9)
Lactic Acid, Venous: 2.6 mmol/L (ref 0.5–1.9)

## 2022-01-13 LAB — RESP PANEL BY RT-PCR (FLU A&B, COVID) ARPGX2
Influenza A by PCR: NEGATIVE
Influenza B by PCR: NEGATIVE
SARS Coronavirus 2 by RT PCR: NEGATIVE

## 2022-01-13 MED ORDER — VANCOMYCIN HCL 1500 MG/300ML IV SOLN: 1500.0000 mg | INTRAVENOUS | Status: DC

## 2022-01-13 MED ORDER — METRONIDAZOLE 500 MG/100ML IV SOLN
500.0000 mg | Freq: Once | INTRAVENOUS | Status: AC
  Administered 2022-01-13: 500 mg via INTRAVENOUS
  Filled 2022-01-13: qty 100

## 2022-01-13 MED ORDER — ENSURE ENLIVE PO LIQD
237.0000 mL | Freq: Two times a day (BID) | ORAL | Status: DC
  Administered 2022-01-14 – 2022-01-27 (×14): 237 mL via ORAL

## 2022-01-13 MED ORDER — LACTATED RINGERS IV BOLUS (SEPSIS)
1000.0000 mL | Freq: Once | INTRAVENOUS | Status: AC
Start: 2022-01-13 — End: 2022-01-13
  Administered 2022-01-13: 1000 mL via INTRAVENOUS

## 2022-01-13 MED ORDER — SODIUM CHLORIDE 0.9 % IV SOLN
2.0000 g | Freq: Once | INTRAVENOUS | Status: AC
  Administered 2022-01-13: 2 g via INTRAVENOUS
  Filled 2022-01-13: qty 12.5

## 2022-01-13 MED ORDER — SODIUM CHLORIDE 0.9 % IV SOLN
2.0000 g | Freq: Two times a day (BID) | INTRAVENOUS | Status: DC
  Administered 2022-01-14 – 2022-01-16 (×5): 2 g via INTRAVENOUS
  Filled 2022-01-13 (×5): qty 12.5

## 2022-01-13 MED ORDER — METRONIDAZOLE 500 MG/100ML IV SOLN
500.0000 mg | Freq: Two times a day (BID) | INTRAVENOUS | Status: DC
  Administered 2022-01-14 – 2022-01-15 (×3): 500 mg via INTRAVENOUS
  Filled 2022-01-13 (×3): qty 100

## 2022-01-13 MED ORDER — IOHEXOL 300 MG/ML  SOLN: 100.0000 mL | Freq: Once | INTRAMUSCULAR | Status: DC | PRN

## 2022-01-13 MED ORDER — POTASSIUM CHLORIDE CRYS ER 20 MEQ PO TBCR
40.0000 meq | EXTENDED_RELEASE_TABLET | Freq: Once | ORAL | Status: AC
  Administered 2022-01-13: 40 meq via ORAL
  Filled 2022-01-13: qty 2

## 2022-01-13 MED ORDER — ENOXAPARIN SODIUM 40 MG/0.4ML IJ SOSY
40.0000 mg | PREFILLED_SYRINGE | INTRAMUSCULAR | Status: DC
  Administered 2022-01-13 – 2022-01-26 (×14): 40 mg via SUBCUTANEOUS
  Filled 2022-01-13 (×14): qty 0.4

## 2022-01-13 MED ORDER — LACTATED RINGERS IV SOLN
INTRAVENOUS | Status: DC
Start: 2022-01-13 — End: 2022-01-14

## 2022-01-13 MED ORDER — POTASSIUM CHLORIDE 10 MEQ/100ML IV SOLN
10.0000 meq | Freq: Once | INTRAVENOUS | Status: AC
  Administered 2022-01-13: 10 meq via INTRAVENOUS
  Filled 2022-01-13: qty 100

## 2022-01-13 MED ORDER — VANCOMYCIN HCL 2000 MG/400ML IV SOLN
2000.0000 mg | Freq: Once | INTRAVENOUS | Status: AC
  Administered 2022-01-13: 2000 mg via INTRAVENOUS
  Filled 2022-01-13: qty 400

## 2022-01-13 NOTE — Assessment & Plan Note (Signed)
Continue home antihypertensives 

## 2022-01-13 NOTE — Assessment & Plan Note (Signed)
Continue Plavix.  ?

## 2022-01-13 NOTE — Assessment & Plan Note (Addendum)
Acute metabolic encephalopathy with reported fever in the field presenting with tachycardia, tachypnea and leukocytosis.  Sepsis of unclear etiology. -Unclear source of infection although reports diarrhea and CT A/P shows fast transition state of colon. Obtain GI stool panel.  -CT A/P also with questionable pneumonia  -UA is pending  -Lactulose noted on med list-obtain ammonia level -continue vancomycin, Flagyl and cefepime.  Patient has history of splenectomy.

## 2022-01-13 NOTE — Assessment & Plan Note (Signed)
Noted to have expressive aphasia and worsening right hand weakness on exam.  These of the same symptoms he presented with an recent hospitalization with for his recent left MCA stroke. -Unclear if this is residual vs recrudescence of his stroke from infection vs new stroke -obtain MRI brain wo contrast

## 2022-01-13 NOTE — H&P (Signed)
History and Physical    Patient: [name] GMW:[id number] DOB: 09/30/1950 DOA: 01/13/2022 DOS: the patient was seen and examined on 01/13/2022 PCP: Biagio Borg, MD  Patient coming from: Rehab   Chief Complaint:  Chief Complaint  Patient presents with   Fever   HPI: [name] is a 71 y.o. male with medical history significant of HTN, HLD , HFpEF, BPH, CKD 3b, recent CVA who presents with altered mental status.  Currently only alert and oriented to self. Also has expressive aphasia. He was recently hospitalized from 7/5-7/8 with expressive aphasia and right-sided weakness.  He was found to have a new acute left MCA branch at that time with high suspicion for embolic etiology and was recommended 30-day cardiac monitor at discharge.  He was then sent to rehab.  He is sent in today reportedly for fever of up to 102F, altered mental status and abdominal pain.  Reportedly is ambulatory but is not ambulatory today.  Currently only complaining of bilateral knee pain.  Also reports having diarrhea but denies abdominal pain. Reports cough.   In the ED, he had temperature of 99.9 reportedly after receiving Tylenol by EMS for fever of 102F.  He was tachycardic up to 109, tachypneic but normotensive on room air.  Has leukocytosis of 13.7.Hypokalemia of 2.9, glucose of 205.  Creatinine elevated 2.82 from prior of 1.96.  Mildly elevated total bilirubin of 1.9 with normal LFTs.  CT A/P with bibasilar atelectasis with superimposed developing infection/inflammation not excluded.  Fast transition status:.  No acute diverticulitis.  There is incidental finding of suprarenal and descending thoracic aortic aneurysm.  Review of Systems: unable to review all systems due to the inability of the patient to answer questions. Past Medical History:  Diagnosis Date   Anemia    normal Fe, nl B12, nl retic, nl EPO July '13   Aortic regurgitation    Moderate AI 04/17/19 echo   Blood transfusion without reported  diagnosis    BPH (benign prostatic hyperplasia)    Chronic back pain    Chronic kidney disease    CKD III, obstructive nephropathy   Diverticulosis    Dysuria    Elevated PSA, greater than or equal to 20 ng/ml June '13   PSA 107   Hemorrhoids, internal, with bleeding, prolapse 09/19/2014   Hyperlipidemia 05/29/2014   Hypertension    Obstructive uropathy 11/27/2015   Pre-diabetes    per patient "reduced sugar intake"   Renal insufficiency    Tuberculosis    h/o PPD +   UTI (urinary tract infection)    Vitamin D deficiency 09/13/2017   Past Surgical History:  Procedure Laterality Date   CARPAL TUNNEL RELEASE Right    COLONOSCOPY     HEMORRHOID BANDING     INSERTION OF MESH N/A 10/17/2019   Procedure: Insertion Of Mesh;  Surgeon: Ralene Ok, MD;  Location: [facility];  Service: General;  Laterality: N/A;   SPLENECTOMY     XI ROBOTIC ASSISTED SIMPLE PROSTATECTOMY N/A 03/09/2019   Procedure: XI ROBOTIC ASSISTED SIMPLE PROSTATECTOMY;  Surgeon: Cleon Gustin, MD;  Location: [facility];  Service: Urology;  Laterality: N/A;   XI ROBOTIC ASSISTED VENTRAL HERNIA N/A 10/17/2019   Procedure: XI ROBOTIC ASSISTED INCISIONAL HERNIA REPAIR WITH MESH;  Surgeon: Ralene Ok, MD;  Location: [facility];  Service: General;  Laterality: N/A;   Social History:  reports that he quit smoking about 41 years ago. His smoking use included cigarettes. He has never used smokeless tobacco.  He reports that he does not drink alcohol and does not use drugs.  No Known Allergies  Family History  Problem Relation Age of Onset   Diabetes Mother    Stroke Brother    Hearing loss Brother    Colon cancer Neg Hx    Rectal cancer Neg Hx    Stomach cancer Neg Hx    Esophageal cancer Neg Hx     Prior to Admission medications   Medication Sig Start Date End Date Taking? Authorizing Provider  acetaminophen (TYLENOL) 500 MG tablet Take 1,000 mg by mouth every 6 (six) hours as needed for moderate pain.     [provider]  allopurinol (ZYLOPRIM) 100 MG tablet Take 2 tablets (200 mg total) by mouth daily. 12/10/20   Biagio Borg, MD  amLODipine (NORVASC) 10 MG tablet Take 1 tablet (10 mg total) by mouth daily. 12/10/20   Biagio Borg, MD  atorvastatin (LIPITOR) 80 MG tablet Take 1 tablet (80 mg total) by mouth daily. 12/12/21   Patrecia Pour, MD  cholecalciferol (VITAMIN D3) 25 MCG (1000 UNIT) tablet Take 1,000 Units by mouth daily.    [provider]  clopidogrel (PLAVIX) 75 MG tablet Take 1 tablet (75 mg total) by mouth daily. 12/13/21   Patrecia Pour, MD  colchicine 0.6 MG tablet Take 1 tablet (0.6 mg total) by mouth daily. 12/10/20   Biagio Borg, MD  hydroxypropyl methylcellulose / hypromellose (ISOPTO TEARS / GONIOVISC) 2.5 % ophthalmic solution Place 1-2 drops into both eyes 3 (three) times daily as needed (dry/irritated eyes.). 11/12/19   Biagio Borg, MD  lactulose (CHRONULAC) 10 GM/15ML solution TAKE 15 TO 30 MLS BY MOUTH ONCE DAILY Patient taking differently: Take 10-20 g by mouth daily. 07/31/21   Gatha Mayer, MD  meclizine (ANTIVERT) 25 MG tablet Take 1 tablet (25 mg total) by mouth 2 (two) times daily as needed for up to 20 doses for dizziness. 05/22/20   Lennice Sites, DO  metoprolol tartrate (LOPRESSOR) 50 MG tablet Take 1 tablet by mouth twice daily 08/04/21   Biagio Borg, MD  Multiple Vitamin (MULTIVITAMIN WITH MINERALS) TABS tablet Take 1 tablet by mouth daily.    [provider]  Potassium 99 MG TABS Take 99 mg by mouth daily.     [provider]  predniSONE (DELTASONE) 20 MG tablet Take 2 tablets (40 mg total) by mouth daily with breakfast. 12/13/21   Patrecia Pour, MD  traMADol (ULTRAM) 50 MG tablet Take 1 tablet (50 mg total) by mouth every 12 (twelve) hours as needed for severe pain. 12/12/21   Patrecia Pour, MD    Physical Exam: Vitals:   01/13/22 1900 01/13/22 1930 01/13/22 2010 01/13/22 2055  BP: 128/82 136/87  (!) 147/89  Pulse: 90 89  91  Resp: '17  20  20  '$ Temp:   98.2 F (36.8 C) 98.1 F (36.7 C)  TempSrc:    Oral  SpO2: 93% 93%  95%  Weight:      Height:       Constitutional: NAD, calm, comfortable, nontoxic-appearing elderly male laying in bed Eyes: lids and conjunctivae normal ENMT: Mucous membranes are moist.  Neck: normal, supple Respiratory: clear to auscultation bilaterally, no wheezing, no crackles. Normal respiratory effort. No accessory muscle use.  Cardiovascular: Regular rate and rhythm, no murmurs / rubs / gallops.  Nonpitting edema of bilateral lower ankles.   Abdomen: no tenderness, no rebound tenderness, guarding or  rigidity, bowel sounds positive.  Musculoskeletal: no clubbing / cyanosis. No joint deformity upper and lower extremities.  Normal muscle tone.  Pain to palpation of bilateral knees but no significant edema, erythema or increase warmth to palpation.  Skin: no rashes, lesions, ulcers.  Neurologic: CN 2-12 grossly intact.  Weak bilateral hand grip worse on the right. Able to lift up bilateral UE without drift. Expressive aphasia. No facial asymmetry.  Alert and oriented only to self. Psychiatric: . Normal mood. Data Reviewed:  See HPI  Assessment and Plan: * AMS (altered mental status) Acute metabolic encephalopathy with reported fever in the field presenting with tachycardia, tachypnea and leukocytosis.  Sepsis of unclear etiology. -Unclear source of infection although reports diarrhea and CT A/P shows fast transition state of colon. Obtain GI stool panel.  -CT A/P also with questionable pneumonia  -UA is pending  -Lactulose noted on med list-obtain ammonia level -continue vancomycin, Flagyl and cefepime.  Patient has history of splenectomy.  History of CVA (cerebrovascular accident) Continue Plavix  Expressive aphasia Noted to have expressive aphasia and worsening right hand weakness on exam.  These of the same symptoms he presented with an recent hospitalization with for his recent left MCA  stroke. -Unclear if this is residual vs recrudescence of his stroke from infection vs new stroke -obtain MRI brain wo contrast  Chronic heart failure with preserved ejection fraction (HFpEF) (HCC) Stable.  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 - Recommend follow-up ultrasound every 3 years  Hypokalemia Repleted with IV and oral K  AKI (acute kidney injury) ([place]) AKI on Stage IIIb CKD -Creatinine elevated 2.82 from prior of 1.96.  -has hx of obstructive uropathy requiring in and out cath -Has foley in place now. Monitor and avoid nephrotoxic agents. Getting sepsis resuscitation IV fluids overnight.  BPH (benign prostatic hyperplasia) With LUTS requiring In-N-Out cath - Foley catheter placed in the ED  Essential hypertension Continue home antihypertensives      Advance Care Planning:   Code Status: Full Code   Consults: none  Family Communication: none at bedside  Severity of Illness: The appropriate patient status for this patient is OBSERVATION. Observation status is judged to be reasonable and necessary in order to provide the required intensity of service to ensure the patient's safety. The patient's presenting symptoms, physical exam findings, and initial radiographic and laboratory data in the context of their medical condition is felt to place them at decreased risk for further clinical deterioration. Furthermore, it is anticipated that the patient will be medically stable for discharge from the hospital within 2 midnights of admission.   Author: Orene Desanctis, DO 01/13/2022 9:48 PM  For on call review [email or web address].

## 2022-01-13 NOTE — Assessment & Plan Note (Signed)
Stable

## 2022-01-13 NOTE — Sepsis Progress Note (Signed)
ELink tracking the Code Sepsis. 

## 2022-01-13 NOTE — ED Triage Notes (Signed)
Ems brings pt in from [facility] for fever, abdominal distension, not acting like himself. No BM in 2 days per facility.

## 2022-01-13 NOTE — Plan of Care (Signed)
  Problem: Nutrition: Goal: Adequate nutrition will be maintained Outcome: Progressing   

## 2022-01-13 NOTE — Assessment & Plan Note (Signed)
With LUTS requiring In-N-Out cath - Foley catheter placed in the ED

## 2022-01-13 NOTE — Assessment & Plan Note (Signed)
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 - Recommend follow-up ultrasound every 3 years

## 2022-01-13 NOTE — ED Provider Notes (Signed)
[facility] DEPT Provider Note   CSN: [id number] Arrival date & time: 01/13/22  1453     History  Chief Complaint  Patient presents with   Fever    Marquelle Gibbon is a 71 y.o. male.  Pt is a 71 yo male with a pmhx significant for CVA (admitted 7/4-7/8 for CVA), HTN, UTI, CKD, HLD, and aortic insuff.  Pt was sent to SNF after his stroke and has been there since the stroke.  The facility said he's had fever of 102, abd pain, and has been acting strangely.  EMS gave pt tylenol en route.  Pt has right knee pain which is chronic and abd pain.       Home Medications Prior to Admission medications   Medication Sig Start Date End Date Taking? Authorizing Provider  acetaminophen (TYLENOL) 500 MG tablet Take 1,000 mg by mouth every 6 (six) hours as needed for moderate pain.     [provider]  allopurinol (ZYLOPRIM) 100 MG tablet Take 2 tablets (200 mg total) by mouth daily. 12/10/20   Biagio Borg, MD  amLODipine (NORVASC) 10 MG tablet Take 1 tablet (10 mg total) by mouth daily. 12/10/20   Biagio Borg, MD  atorvastatin (LIPITOR) 80 MG tablet Take 1 tablet (80 mg total) by mouth daily. 12/12/21   Patrecia Pour, MD  cholecalciferol (VITAMIN D3) 25 MCG (1000 UNIT) tablet Take 1,000 Units by mouth daily.    [provider]  clopidogrel (PLAVIX) 75 MG tablet Take 1 tablet (75 mg total) by mouth daily. 12/13/21   Patrecia Pour, MD  colchicine 0.6 MG tablet Take 1 tablet (0.6 mg total) by mouth daily. 12/10/20   Biagio Borg, MD  hydroxypropyl methylcellulose / hypromellose (ISOPTO TEARS / GONIOVISC) 2.5 % ophthalmic solution Place 1-2 drops into both eyes 3 (three) times daily as needed (dry/irritated eyes.). 11/12/19   Biagio Borg, MD  lactulose (CHRONULAC) 10 GM/15ML solution TAKE 15 TO 30 MLS BY MOUTH ONCE DAILY Patient taking differently: Take 10-20 g by mouth daily. 07/31/21   Gatha Mayer, MD  meclizine (ANTIVERT) 25 MG tablet Take 1 tablet (25 mg  total) by mouth 2 (two) times daily as needed for up to 20 doses for dizziness. 05/22/20   Lennice Sites, DO  metoprolol tartrate (LOPRESSOR) 50 MG tablet Take 1 tablet by mouth twice daily 08/04/21   Biagio Borg, MD  Multiple Vitamin (MULTIVITAMIN WITH MINERALS) TABS tablet Take 1 tablet by mouth daily.    [provider]  Potassium 99 MG TABS Take 99 mg by mouth daily.     [provider]  predniSONE (DELTASONE) 20 MG tablet Take 2 tablets (40 mg total) by mouth daily with breakfast. 12/13/21   Patrecia Pour, MD  traMADol (ULTRAM) 50 MG tablet Take 1 tablet (50 mg total) by mouth every 12 (twelve) hours as needed for severe pain. 12/12/21   Patrecia Pour, MD      Allergies    Patient has no known allergies.    Review of Systems   Review of Systems  Gastrointestinal:  Positive for abdominal pain.  Neurological:  Positive for weakness.  All other systems reviewed and are negative.   Physical Exam Updated Vital Signs BP 136/87   Pulse 89   Temp 99.9 F (37.7 C) (Rectal)   Resp 20   Ht '6\' 1"'$  (1.854 m)   Wt 115 kg   SpO2 93%   BMI  33.45 kg/m  Physical Exam Vitals and nursing note reviewed.  Constitutional:      Appearance: Normal appearance.  HENT:     Head: Normocephalic and atraumatic.     Right Ear: External ear normal.     Left Ear: External ear normal.     Nose: Nose normal.     Mouth/Throat:     Mouth: Mucous membranes are dry.  Eyes:     Extraocular Movements: Extraocular movements intact.     Conjunctiva/sclera: Conjunctivae normal.     Pupils: Pupils are equal, round, and reactive to light.  Cardiovascular:     Rate and Rhythm: Regular rhythm. Tachycardia present.     Pulses: Normal pulses.     Heart sounds: Normal heart sounds.  Pulmonary:     Effort: Pulmonary effort is normal.     Breath sounds: Normal breath sounds.  Abdominal:     General: Abdomen is flat. Bowel sounds are normal. There is distension.     Palpations: Abdomen is soft.      Tenderness: There is abdominal tenderness.  Musculoskeletal:     Cervical back: Normal range of motion and neck supple.     Comments: Right knee swelling  Skin:    General: Skin is warm.     Capillary Refill: Capillary refill takes 2 to 3 seconds.  Neurological:     Mental Status: He is alert. He is disoriented.     Comments: Right leg weakness (chronic)     ED Results / Procedures / Treatments   Labs (all labs ordered are listed, but only abnormal results are displayed) Labs Reviewed  COMPREHENSIVE METABOLIC PANEL - Abnormal; Notable for the following components:      Result Value   Potassium 2.9 (*)    Glucose, Bld 205 (*)    BUN 27 (*)    Creatinine, Ser 2.82 (*)    Calcium 8.5 (*)    Albumin 3.1 (*)    Total Bilirubin 1.9 (*)    GFR, Estimated 23 (*)    All other components within normal limits  CBC WITH DIFFERENTIAL/PLATELET - Abnormal; Notable for the following components:   WBC 13.7 (*)    Neutro Abs 11.6 (*)    Abs Immature Granulocytes 0.08 (*)    All other components within normal limits  PROTIME-INR - Abnormal; Notable for the following components:   Prothrombin Time 15.5 (*)    All other components within normal limits  URINALYSIS, ROUTINE W REFLEX MICROSCOPIC - Abnormal; Notable for the following components:   Color, Urine AMBER (*)    APPearance HAZY (*)    Protein, ur 30 (*)    Leukocytes,Ua TRACE (*)    Bacteria, UA RARE (*)    All other components within normal limits  RESP PANEL BY RT-PCR (FLU A&B, COVID) ARPGX2  CULTURE, BLOOD (ROUTINE X 2)  CULTURE, BLOOD (ROUTINE X 2)  URINE CULTURE  LACTIC ACID, PLASMA  MAGNESIUM  LACTIC ACID, PLASMA    EKG EKG Interpretation  Date/Time:  Wednesday January 13 2022 15:25:43 EDT Ventricular Rate:  110 PR Interval:  159 QRS Duration: 160 QT Interval:  379 QTC Calculation: 513 R Axis:   -68 Text Interpretation: Fast sinus arrhythmia RBBB and LAFB No significant change since last tracing Confirmed by  Isla Pence [phone]) on 01/13/2022 3:59:14 PM  Radiology CT ABDOMEN PELVIS WO CONTRAST  Result Date: 01/13/2022 CLINICAL DATA:  Sepsis. "Ems brings pt in from [place] for fever, abdominal distension, not acting  like himself. No BM in 2 days per facility." sepsis EXAM: CT ABDOMEN AND PELVIS WITHOUT CONTRAST TECHNIQUE: Multidetector CT imaging of the abdomen and pelvis was performed following the standard protocol without IV contrast. RADIATION DOSE REDUCTION: This exam was performed according to the departmental dose-optimization program which includes automated exposure control, adjustment of the mA and/or kV according to patient size and/or use of iterative reconstruction technique. COMPARISON:  CT abdomen pelvis 05/22/2020 FINDINGS: Lower chest: Bibasilar atelectasis. Enlarged descending thoracic aorta measuring up 3.6 cm. Hepatobiliary: Grossly stable 1.1 cm fluid density lesion within the liver likely representing a simple hepatic cyst. Status post cholecystectomy. No biliary dilatation. Pancreas: No focal lesion. Normal pancreatic contour. No surrounding inflammatory changes. No main pancreatic ductal dilatation. Spleen: Status post splenectomy. Likely splenosis within the left upper quadrant (2:25, 5:71). Adrenals/Urinary Tract: No adrenal nodule bilaterally. Atrophic right kidney. Bilateral renal cortical scarring. Fluid density lesions within the kidneys likely represent simple renal cysts. Simple renal cysts, in the absence of clinically indicated signs/symptoms, require no independent follow-up. Subcentimeter hypodensities are too small to characterize. No nephrolithiasis and no hydronephrosis. No ureterolithiasis or hydroureter. The urinary bladder is unremarkable. Stomach/Bowel: Stomach is within normal limits. No evidence of bowel wall thickening or dilatation. Colonic diverticulosis with no acute diverticulitis. Fluid density within the lumen of the rectum. Appendix appears normal.  Vascular/Lymphatic: Suprarenal abdominal aorta with an enlarged caliber caliber measuring up to 3.3 cm. Infrarenal abdominal aorta normal in caliber. Mild-to-moderate atherosclerotic plaque of the aorta and its branches. No abdominal, pelvic, or inguinal lymphadenopathy. Reproductive: TURP procedure with enlarged prostate measuring up to 6 cm. Other: No intraperitoneal free fluid. No intraperitoneal free gas. No organized fluid collection. Musculoskeletal: No abdominal wall hernia or abnormality. No suspicious lytic or blastic osseous lesions. No acute displaced fracture. Multilevel degenerative changes of the spine. IMPRESSION: 1. Bibasilar atelectasis with superimposed developing infection/inflammation not excluded. 2. Fast transition state of the colon. 3. Colonic diverticulosis with no acute diverticulitis. 4. 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 Recommend follow-up ultrasound every 3 years. This recommendation follows ACR consensus guidelines: White Paper of the ACR Incidental Findings Committee II on Vascular Findings. J Am Coll Radiol 2013; 10:789-794. 5. 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 6. Atrophic right kidney. 7. Prostatomegaly status post TURP 8.  Aortic Atherosclerosis (ICD10-I70.0). Electronically Signed   By: Iven Finn M.D.   On: 01/13/2022 17:25   DG Chest Port 1 View  Result Date: 01/13/2022 CLINICAL DATA:  Possible sepsis. EXAM: PORTABLE CHEST 1 VIEW COMPARISON:  12/09/2021 FINDINGS: Stable enlarged cardiac silhouette and tortuous aorta. A poor inspiration is again demonstrated. Decreased mild atelectasis at the left lung base and interval mild atelectasis at the right lung base. Unremarkable bones. IMPRESSION: Poor inspiration with mild bibasilar atelectasis, decreased on the left and new on the right. No evidence of pneumonia. Electronically Signed   By: Claudie Revering M.D.   On: 01/13/2022 15:47    Procedures Procedures    Medications Ordered in  ED Medications  lactated ringers infusion ( Intravenous New Bag/Given 01/13/22 1541)  iohexol (OMNIPAQUE) 300 MG/ML solution 100 mL (has no administration in time range)  metroNIDAZOLE (FLAGYL) IVPB 500 mg (0 mg Intravenous Stopped 01/13/22 1722)  lactated ringers bolus 1,000 mL (1,000 mLs Intravenous New Bag/Given 01/13/22 1539)  ceFEPIme (MAXIPIME) 2 g in sodium chloride 0.9 % 100 mL IVPB (0 g Intravenous Stopped 01/13/22 1617)  vancomycin (VANCOREADY) IVPB 2000 mg/400 mL (2,000 mg Intravenous New Bag/Given 01/13/22 1730)  potassium chloride  10 mEq in 100 mL IVPB (0 mEq Intravenous Stopped 01/13/22 1827)    ED Course/ Medical Decision Making/ A&P                           Medical Decision Making Amount and/or Complexity of Data Reviewed Labs: ordered. Radiology: ordered. ECG/medicine tests: ordered.  Risk Prescription drug management. Decision regarding hospitalization.   This patient presents to the ED for concern of sepsis, this involves an extensive number of treatment options, and is a complaint that carries with it a high risk of complications and morbidity.  The differential diagnosis includes sepsis, infection   Co morbidities that complicate the patient evaluation  CVA (admitted 7/4-7/8 for CVA), HTN, UTI, CKD, HLD, and aortic insuff   Additional history obtained:  Additional history obtained from epic chart review External records from outside source obtained and reviewed including EMS report   Lab Tests:  I Ordered, and personally interpreted labs.  The pertinent results include:  cbc with wbc elevated at 13.7; inr 1.2; cmp with K low at 2.9, glucose elevated at 205, bun 27 and Cr 2.82 (2 weeks ago it was 1.96)   Imaging Studies ordered:  I ordered imaging studies including CXR  I independently visualized and interpreted imaging which showed  Unremarkable bones.    IMPRESSION:  Poor inspiration with mild bibasilar atelectasis, decreased on the  left and new on the  right. No evidence of pneumonia.   I agree with the radiologist interpretation   Cardiac Monitoring:  The patient was maintained on a cardiac monitor.  I personally viewed and interpreted the cardiac monitored which showed an underlying rhythm of: sinus tachy initially; now nsr   Medicines ordered and prescription drug management:  I ordered medication including ivfs and unkn source abx  for possible sepsis  Reevaluation of the patient after these medicines showed that the patient improved I have reviewed the patients home medicines and have made adjustments as needed   Test Considered:  ct   Critical Interventions:  foley   Consultations Obtained:  I requested consultation with the hospitalist (Dr. Flossie Buffy),  and discussed lab and imaging findings as well as pertinent plan - she will admit   Problem List / ED Course:  Febrile illness:  code sepsis called, but bp is ok and LA is ok.  Pt's fever is down after tylenol.  Pt may have pna seen on CXR.  Pt is d/w Dr. Flossie Buffy for admission. Hypokalemia:  pt given IV Kcl AKI:  pt given IVFs Urinary retention:  foley placed and urine drained.   Reevaluation:  After the interventions noted above, I reevaluated the patient and found that they have :improved   Social Determinants of Health:  Lives in SNF   Dispostion:  After consideration of the diagnostic results and the patients response to treatment, I feel that the patent would benefit from admission.    CRITICAL CARE Performed by: Isla Pence   Total critical care time: 30 minutes  Critical care time was exclusive of separately billable procedures and treating other patients.  Critical care was necessary to treat or prevent imminent or life-threatening deterioration.  Critical care was time spent personally by me on the following activities: development of treatment plan with patient and/or surrogate as well as nursing, discussions with consultants, evaluation of  patient's response to treatment, examination of patient, obtaining history from patient or surrogate, ordering and performing treatments and interventions, ordering and review  of laboratory studies, ordering and review of radiographic studies, pulse oximetry and re-evaluation of patient's condition.         Final Clinical Impression(s) / ED Diagnoses Final diagnoses:  Febrile illness  Hypokalemia  Urinary retention  AKI (acute kidney injury) [facility])    Rx / [facility] Orders ED Discharge Orders     None         Isla Pence, MD 01/13/22 1945

## 2022-01-13 NOTE — Progress Notes (Signed)
Pharmacy Antibiotic Note  [name] is a 71 y.o. male admitted on 01/13/2022 with medical history significant of HTN, HLD , HFpEF, BPH, CKD 3b, recent CVA who presents with altered mental status.  He recently hospitalized from 7/5-7/8 with expressive aphasia and right-sided weakness.  He was found to have a new acute left MCA branch at that time with high suspicion for embolic etiology and was recommended 30-day cardiac monitor at discharge.  He was then sent to rehab.  Pharmacy has been consulted to dose vancomycin and cefepime for sepsis, received 1st doses in the ED  Plan: Vancomycin '1500mg'$  IV q48h (AUC 484.2, Scr 2.82) Cefepime 2gm IV q12h Flagyl per MD  Height: '6\' 1"'$  (185.4 cm) Weight: 115 kg (253 lb 8.5 oz) IBW/kg (Calculated) : 79.9  Temp (24hrs), Avg:98.7 F (37.1 C), Min:98.1 F (36.7 C), Max:99.9 F (37.7 C)  Recent Labs  Lab 01/13/22 1512  WBC 13.7*  CREATININE 2.82*  LATICACIDVEN 1.6    Estimated Creatinine Clearance: 31.9 mL/min (A) (by C-G formula based on SCr of 2.82 mg/dL (H)).    No Known Allergies  Antimicrobials this admission: 8/9 vanc >> 8/9 cefepime >> 8/9 flagyl >>  Dose adjustments this admission:   Microbiology results: 8/9 BCx:  8/9 UCx:   8/9 GI PCR:   Thank you for allowing pharmacy to be a part of this patient's care.  Dolly Rias RPh 01/13/2022, 10:02 PM

## 2022-01-13 NOTE — Assessment & Plan Note (Signed)
AKI on Stage IIIb CKD -Creatinine elevated 2.82 from prior of 1.96.  -has hx of obstructive uropathy requiring in and out cath -Has foley in place now. Monitor and avoid nephrotoxic agents. Getting sepsis resuscitation IV fluids overnight.

## 2022-01-13 NOTE — Progress Notes (Signed)
Pt given 3oz of water to determine dysphagia screening. Pt was able to drink all 3 oz without stopping and was able to drink all of the water without stopping or coughing afterward. It is in this nurse's opinion that the patient has no issues swallowing and will now administer oral medications.

## 2022-01-13 NOTE — Assessment & Plan Note (Signed)
Repleted with IV and oral K

## 2022-01-13 NOTE — Progress Notes (Signed)
A consult was received from an ED physician for cefepime and vancomycin per pharmacy dosing.  The patient's profile has been reviewed for ht/wt/allergies/indication/available labs.  A one time order has been placed for cefepime 2g IV x1 and vancomycin '2000mg'$  IV x1.    Further antibiotics/pharmacy consults should be ordered by admitting physician if indicated.                       Thank you,  Dimple Nanas, PharmD, BCPS 01/13/2022 3:19 PM

## 2022-01-14 ENCOUNTER — Observation Stay (HOSPITAL_COMMUNITY): Payer: Medicare Other

## 2022-01-14 DIAGNOSIS — G9341 Metabolic encephalopathy: Secondary | ICD-10-CM

## 2022-01-14 DIAGNOSIS — G9340 Encephalopathy, unspecified: Secondary | ICD-10-CM | POA: Diagnosis not present

## 2022-01-14 DIAGNOSIS — K5730 Diverticulosis of large intestine without perforation or abscess without bleeding: Secondary | ICD-10-CM | POA: Diagnosis not present

## 2022-01-14 DIAGNOSIS — D72829 Elevated white blood cell count, unspecified: Secondary | ICD-10-CM | POA: Diagnosis not present

## 2022-01-14 DIAGNOSIS — M25462 Effusion, left knee: Secondary | ICD-10-CM | POA: Diagnosis not present

## 2022-01-14 DIAGNOSIS — R4701 Aphasia: Secondary | ICD-10-CM | POA: Diagnosis not present

## 2022-01-14 DIAGNOSIS — K648 Other hemorrhoids: Secondary | ICD-10-CM | POA: Diagnosis not present

## 2022-01-14 DIAGNOSIS — N138 Other obstructive and reflux uropathy: Secondary | ICD-10-CM | POA: Diagnosis present

## 2022-01-14 DIAGNOSIS — I77810 Thoracic aortic ectasia: Secondary | ICD-10-CM | POA: Diagnosis not present

## 2022-01-14 DIAGNOSIS — J159 Unspecified bacterial pneumonia: Secondary | ICD-10-CM | POA: Diagnosis present

## 2022-01-14 DIAGNOSIS — I700 Atherosclerosis of aorta: Secondary | ICD-10-CM | POA: Diagnosis present

## 2022-01-14 DIAGNOSIS — R140 Abdominal distension (gaseous): Secondary | ICD-10-CM | POA: Diagnosis not present

## 2022-01-14 DIAGNOSIS — Z20822 Contact with and (suspected) exposure to covid-19: Secondary | ICD-10-CM | POA: Diagnosis present

## 2022-01-14 DIAGNOSIS — E785 Hyperlipidemia, unspecified: Secondary | ICD-10-CM | POA: Diagnosis present

## 2022-01-14 DIAGNOSIS — I5032 Chronic diastolic (congestive) heart failure: Secondary | ICD-10-CM | POA: Diagnosis not present

## 2022-01-14 DIAGNOSIS — K625 Hemorrhage of anus and rectum: Secondary | ICD-10-CM | POA: Diagnosis not present

## 2022-01-14 DIAGNOSIS — R410 Disorientation, unspecified: Secondary | ICD-10-CM | POA: Diagnosis not present

## 2022-01-14 DIAGNOSIS — I714 Abdominal aortic aneurysm, without rupture, unspecified: Secondary | ICD-10-CM | POA: Diagnosis present

## 2022-01-14 DIAGNOSIS — N179 Acute kidney failure, unspecified: Secondary | ICD-10-CM | POA: Diagnosis present

## 2022-01-14 DIAGNOSIS — I5033 Acute on chronic diastolic (congestive) heart failure: Secondary | ICD-10-CM | POA: Diagnosis present

## 2022-01-14 DIAGNOSIS — I7123 Aneurysm of the descending thoracic aorta, without rupture: Secondary | ICD-10-CM | POA: Diagnosis present

## 2022-01-14 DIAGNOSIS — I471 Supraventricular tachycardia: Secondary | ICD-10-CM | POA: Diagnosis not present

## 2022-01-14 DIAGNOSIS — I69320 Aphasia following cerebral infarction: Secondary | ICD-10-CM | POA: Diagnosis not present

## 2022-01-14 DIAGNOSIS — R5381 Other malaise: Secondary | ICD-10-CM

## 2022-01-14 DIAGNOSIS — K921 Melena: Secondary | ICD-10-CM | POA: Diagnosis not present

## 2022-01-14 DIAGNOSIS — E669 Obesity, unspecified: Secondary | ICD-10-CM | POA: Diagnosis present

## 2022-01-14 DIAGNOSIS — A419 Sepsis, unspecified organism: Secondary | ICD-10-CM | POA: Diagnosis present

## 2022-01-14 DIAGNOSIS — R6520 Severe sepsis without septic shock: Secondary | ICD-10-CM | POA: Diagnosis not present

## 2022-01-14 DIAGNOSIS — Z8673 Personal history of transient ischemic attack (TIA), and cerebral infarction without residual deficits: Secondary | ICD-10-CM | POA: Diagnosis not present

## 2022-01-14 DIAGNOSIS — D631 Anemia in chronic kidney disease: Secondary | ICD-10-CM | POA: Diagnosis present

## 2022-01-14 DIAGNOSIS — K5731 Diverticulosis of large intestine without perforation or abscess with bleeding: Secondary | ICD-10-CM | POA: Diagnosis present

## 2022-01-14 DIAGNOSIS — K567 Ileus, unspecified: Secondary | ICD-10-CM | POA: Diagnosis not present

## 2022-01-14 DIAGNOSIS — K6389 Other specified diseases of intestine: Secondary | ICD-10-CM | POA: Diagnosis not present

## 2022-01-14 DIAGNOSIS — N1832 Chronic kidney disease, stage 3b: Secondary | ICD-10-CM | POA: Diagnosis present

## 2022-01-14 DIAGNOSIS — M25562 Pain in left knee: Secondary | ICD-10-CM | POA: Diagnosis not present

## 2022-01-14 DIAGNOSIS — R400 Somnolence: Secondary | ICD-10-CM | POA: Diagnosis not present

## 2022-01-14 DIAGNOSIS — I69351 Hemiplegia and hemiparesis following cerebral infarction affecting right dominant side: Secondary | ICD-10-CM | POA: Diagnosis not present

## 2022-01-14 DIAGNOSIS — N390 Urinary tract infection, site not specified: Secondary | ICD-10-CM | POA: Diagnosis present

## 2022-01-14 DIAGNOSIS — I358 Other nonrheumatic aortic valve disorders: Secondary | ICD-10-CM | POA: Diagnosis present

## 2022-01-14 DIAGNOSIS — R0600 Dyspnea, unspecified: Secondary | ICD-10-CM | POA: Diagnosis not present

## 2022-01-14 DIAGNOSIS — N139 Obstructive and reflux uropathy, unspecified: Secondary | ICD-10-CM | POA: Diagnosis not present

## 2022-01-14 DIAGNOSIS — M7989 Other specified soft tissue disorders: Secondary | ICD-10-CM | POA: Diagnosis not present

## 2022-01-14 DIAGNOSIS — I4729 Other ventricular tachycardia: Secondary | ICD-10-CM | POA: Diagnosis not present

## 2022-01-14 DIAGNOSIS — L0201 Cutaneous abscess of face: Secondary | ICD-10-CM | POA: Diagnosis not present

## 2022-01-14 DIAGNOSIS — I130 Hypertensive heart and chronic kidney disease with heart failure and stage 1 through stage 4 chronic kidney disease, or unspecified chronic kidney disease: Secondary | ICD-10-CM | POA: Diagnosis present

## 2022-01-14 DIAGNOSIS — R338 Other retention of urine: Secondary | ICD-10-CM | POA: Diagnosis not present

## 2022-01-14 DIAGNOSIS — R3914 Feeling of incomplete bladder emptying: Secondary | ICD-10-CM | POA: Diagnosis not present

## 2022-01-14 DIAGNOSIS — I10 Essential (primary) hypertension: Secondary | ICD-10-CM | POA: Diagnosis not present

## 2022-01-14 DIAGNOSIS — J9811 Atelectasis: Secondary | ICD-10-CM | POA: Diagnosis present

## 2022-01-14 DIAGNOSIS — B999 Unspecified infectious disease: Secondary | ICD-10-CM | POA: Diagnosis not present

## 2022-01-14 DIAGNOSIS — N401 Enlarged prostate with lower urinary tract symptoms: Secondary | ICD-10-CM | POA: Diagnosis not present

## 2022-01-14 DIAGNOSIS — Z87891 Personal history of nicotine dependence: Secondary | ICD-10-CM | POA: Diagnosis not present

## 2022-01-14 DIAGNOSIS — E876 Hypokalemia: Secondary | ICD-10-CM | POA: Diagnosis present

## 2022-01-14 DIAGNOSIS — I639 Cerebral infarction, unspecified: Secondary | ICD-10-CM | POA: Diagnosis not present

## 2022-01-14 DIAGNOSIS — R109 Unspecified abdominal pain: Secondary | ICD-10-CM | POA: Diagnosis not present

## 2022-01-14 DIAGNOSIS — E861 Hypovolemia: Secondary | ICD-10-CM | POA: Diagnosis not present

## 2022-01-14 LAB — COMPREHENSIVE METABOLIC PANEL
ALT: 16 U/L (ref 0–44)
AST: 29 U/L (ref 15–41)
Albumin: 2.4 g/dL — ABNORMAL LOW (ref 3.5–5.0)
Alkaline Phosphatase: 82 U/L (ref 38–126)
Anion gap: 7 (ref 5–15)
BUN: 31 mg/dL — ABNORMAL HIGH (ref 8–23)
CO2: 26 mmol/L (ref 22–32)
Calcium: 8.2 mg/dL — ABNORMAL LOW (ref 8.9–10.3)
Chloride: 106 mmol/L (ref 98–111)
Creatinine, Ser: 2.71 mg/dL — ABNORMAL HIGH (ref 0.61–1.24)
GFR, Estimated: 24 mL/min — ABNORMAL LOW (ref >60)
Glucose, Bld: 106 mg/dL — ABNORMAL HIGH (ref 70–99)
Potassium: 3.5 mmol/L (ref 3.5–5.1)
Sodium: 139 mmol/L (ref 135–145)
Total Bilirubin: 1 mg/dL (ref 0.3–1.2)
Total Protein: 5.8 g/dL — ABNORMAL LOW (ref 6.5–8.1)

## 2022-01-14 LAB — CBC WITH DIFFERENTIAL/PLATELET
Abs Immature Granulocytes: 0.08 10*3/uL — ABNORMAL HIGH (ref 0.00–0.07)
Basophils Absolute: 0 10*3/uL (ref 0.0–0.1)
Basophils Relative: 0 %
Eosinophils Absolute: 0 10*3/uL (ref 0.0–0.5)
Eosinophils Relative: 0 %
HCT: 35 % — ABNORMAL LOW (ref 39.0–52.0)
Hemoglobin: 11.5 g/dL — ABNORMAL LOW (ref 13.0–17.0)
Immature Granulocytes: 1 %
Lymphocytes Relative: 10 %
Lymphs Abs: 1.4 10*3/uL (ref 0.7–4.0)
MCH: 29.1 pg (ref 26.0–34.0)
MCHC: 32.9 g/dL (ref 30.0–36.0)
MCV: 88.6 fL (ref 80.0–100.0)
Monocytes Absolute: 1.2 10*3/uL — ABNORMAL HIGH (ref 0.1–1.0)
Monocytes Relative: 9 %
Neutro Abs: 10.9 10*3/uL — ABNORMAL HIGH (ref 1.7–7.7)
Neutrophils Relative %: 80 %
Platelets: 242 10*3/uL (ref 150–400)
RBC: 3.95 MIL/uL — ABNORMAL LOW (ref 4.22–5.81)
RDW: 14.8 % (ref 11.5–15.5)
WBC: 13.6 10*3/uL — ABNORMAL HIGH (ref 4.0–10.5)
nRBC: 0 % (ref 0.0–0.2)

## 2022-01-14 LAB — LACTIC ACID, PLASMA
Lactic Acid, Venous: 0.9 mmol/L (ref 0.5–1.9)
Lactic Acid, Venous: 0.9 mmol/L (ref 0.5–1.9)

## 2022-01-14 LAB — C-REACTIVE PROTEIN: CRP: 34.1 mg/dL — ABNORMAL HIGH (ref <1.0)

## 2022-01-14 LAB — PROCALCITONIN: Procalcitonin: 0.62 ng/mL

## 2022-01-14 LAB — MAGNESIUM: Magnesium: 1.9 mg/dL (ref 1.7–2.4)

## 2022-01-14 LAB — MRSA NEXT GEN BY PCR, NASAL: MRSA by PCR Next Gen: NOT DETECTED

## 2022-01-14 MED ORDER — TAMSULOSIN HCL 0.4 MG PO CAPS
0.4000 mg | ORAL_CAPSULE | Freq: Every day | ORAL | Status: DC
  Administered 2022-01-14 – 2022-01-26 (×13): 0.4 mg via ORAL
  Filled 2022-01-14 (×13): qty 1

## 2022-01-14 MED ORDER — CLOPIDOGREL BISULFATE 75 MG PO TABS
75.0000 mg | ORAL_TABLET | Freq: Every day | ORAL | Status: DC
Start: 2022-01-14 — End: 2022-01-22
  Administered 2022-01-14 – 2022-01-21 (×8): 75 mg via ORAL
  Filled 2022-01-14 (×8): qty 1

## 2022-01-14 MED ORDER — ACETAMINOPHEN 325 MG PO TABS
650.0000 mg | ORAL_TABLET | Freq: Four times a day (QID) | ORAL | Status: DC | PRN
  Administered 2022-01-14 – 2022-01-27 (×10): 650 mg via ORAL
  Filled 2022-01-14 (×10): qty 2

## 2022-01-14 MED ORDER — SODIUM CHLORIDE 0.9 % IV SOLN: INTRAVENOUS | Status: DC

## 2022-01-14 MED ORDER — CHLORHEXIDINE GLUCONATE CLOTH 2 % EX PADS
6.0000 | MEDICATED_PAD | Freq: Every day | CUTANEOUS | Status: DC
  Administered 2022-01-14 – 2022-01-27 (×14): 6 via TOPICAL

## 2022-01-14 NOTE — Progress Notes (Addendum)
PROGRESS NOTE    [name]  XFG:[id number] DOB: April 03, 1951 DOA: 01/13/2022 PCP: Biagio Borg, MD   Brief Narrative:  71 y.o. 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  On presentation, EMS reported temperature of 102.  In the ED, he was tachycardic, tachypneic with mild leukocytosis and creatinine elevated to 2.82 from prior of 1.96.  CT of abdomen and pelvis showed bibasilar atelectasis with superimposed developing infection/inflammation not excluded with fresh transition state of the colon, no diverticulitis.  He was started on IV fluids and antibiotics.  Assessment & Plan:   Acute metabolic encephalopathy -Possibly from fever/sepsis.  Still slow to respond this morning but more awake.  Still has expressive aphasia which is probably his baseline. -Monitor mental status.  Fall precautions. -SLP eval. -PT eval.  Possible sepsis: Present on admission; unclear etiology Questionable bacterial pneumonia Questionable infective diarrhea Possible UTI: Present on admission -Presented with fever, tachycardia, tachypnea and leukocytosis with possibility of sepsis from possible pneumonia versus infective diarrhea versus UTI.  UA showing pyuria. -Follow cultures.  Follow GI PCR.  Will also check stool for C. difficile.  CT of abdomen/pelvis as above. -Continue IV fluids and broad-spectrum antibiotics for now.  Leukocytosis--monitor  Normocytic anemia From anemia of chronic disease.  Hemoglobin stable.  Monitor  Acute kidney injury on CKD stage IIIb -Creatinine 2.82 on presentation; was 1.96 on discharge last month -Continue IV fluids.  Creatinine 2.71 this morning.  Repeat a.m. labs.  No hydronephrosis on CT of abdomen.  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  Patient possibly has residual aphasia with aphasia and right-sided weakness from prior stroke.  Unclear if the patient is having a new stroke.  MRI of brain is pending. -Continue Plavix.  Outpatient follow-up with neurology.  Chronic diastolic heart failure -Currently compensated.  Strict input and output.  Daily weights.  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  Will need in and out cath if has symptoms of retention.  Start Flomax.  Might need outpatient urology evaluation  Essential hypertension -Monitor blood pressure.  Physical deconditioning -PT eval.  Hypokalemia -Resolved  Obesity -Outpatient follow-up  DVT prophylaxis: Lovenox Code Status: Full Family Communication: None at bedside Disposition Plan: Status is: Observation The patient will require care spanning > 2 midnights and should be moved to inpatient because: Of severity of illness.  Need for IV antibiotics and fluids.    Consultants: None  Procedures: None  Antimicrobials: Cefepime, Flagyl and vancomycin from 01/13/2022 onwards   Subjective: Patient seen and examined at bedside.  Awake, tries to answer some questions but has expressive aphasia and speech is mostly incomprehensible.  No overnight fever, vomiting, agitation reported.  Complains of some abdominal pain.  Objective: Vitals:   01/13/22 2055 01/14/22 0059 01/14/22 0455 01/14/22 1013  BP: (!) 147/89 122/80 136/79 133/80  Pulse: 91 79 79 82  Resp: '20 18  19  '$ Temp: 98.1 F (36.7 C) 98.2 F (36.8 C) 97.6 F (36.4 C) 97.7 F (36.5 C)  TempSrc: Oral Oral Oral Oral  SpO2: 95%  97% 97% 96%  Weight:      Height:         Intake/Output Summary (Last 24 hours) at 01/14/2022 1121 Last data filed at 01/14/2022 0433 Gross per 24 hour  Intake 1565.09 ml  Output 1700 ml  Net -134.91 ml   Filed Weights   01/13/22 1523  Weight: 115 kg    Examination:  General exam: Appears calm and comfortable.  Looks chronically ill and deconditioned.  Currently on room air. Respiratory system: Bilateral decreased breath sounds at bases with some scattered crackles Cardiovascular system: S1 & S2 heard, Rate controlled Gastrointestinal system: Abdomen is obese, nondistended, soft and mildly tender in the lower quadrant.  Normal bowel sounds heard. Extremities: No cyanosis, clubbing; trace lower extremity edema present  Central nervous system: Awake, has some expressive aphasia and speech is mostly incomprehensible.  No focal neurological deficits.  Right-sided weakness present Skin: No rashes, lesions or ulcers Psychiatry: Flat affect.  No signs of agitation.    Data Reviewed: I have personally reviewed following labs and imaging studies  CBC: Recent Labs  Lab 01/13/22 1512 01/13/22 2132 01/14/22 0814  WBC 13.7* 13.2* 13.6*  NEUTROABS 11.6*  --  10.9*  HGB 13.1 12.4* 11.5*  HCT 39.6 37.6* 35.0*  MCV 88.0 88.3 88.6  PLT 251 245 765   Basic Metabolic Panel: Recent Labs  Lab 01/13/22 1512 01/13/22 2132 01/14/22 0507  NA 138 139 139  K 2.9* 3.2* 3.5  CL 102 103 106  CO2 '25 25 26  '$ GLUCOSE 205* 185* 106*  BUN 27* 27* 31*  CREATININE 2.82* 2.83* 2.71*  CALCIUM 8.5* 8.3* 8.2*  MG 1.8  --  1.9   GFR: Estimated Creatinine Clearance: 33.2 mL/min (A) (by C-G formula based on SCr of 2.71 mg/dL (H)). Liver Function Tests: Recent Labs  Lab 01/13/22 1512 01/14/22 0507  AST 33 29  ALT 21 16  ALKPHOS 93 82  BILITOT 1.9* 1.0  PROT 6.9 5.8*  ALBUMIN 3.1* 2.4*   No results for input(s): "LIPASE", "AMYLASE" in the last 168 hours. Recent Labs  Lab 01/13/22 2133  AMMONIA <10   Coagulation  Profile: Recent Labs  Lab 01/13/22 1512  INR 1.2   Cardiac Enzymes: No results for input(s): "CKTOTAL", "CKMB", "CKMBINDEX", "TROPONINI" in the last 168 hours. BNP (last 3 results) No results for input(s): "PROBNP" in the last 8760 hours. HbA1C: No results for input(s): "HGBA1C" in the last 72 hours. CBG: No results for input(s): "GLUCAP" in the last 168 hours. Lipid Profile: No results for input(s): "CHOL", "HDL", "LDLCALC", "TRIG", "CHOLHDL", "LDLDIRECT" in the last 72 hours. Thyroid Function Tests: No results for input(s): "TSH", "T4TOTAL", "FREET4", "T3FREE", "THYROIDAB" in the last 72 hours. Anemia Panel: No results for input(s): "VITAMINB12", "FOLATE", "FERRITIN", "TIBC", "IRON", "RETICCTPCT" in the last 72 hours. Sepsis Labs: Recent Labs  Lab 01/13/22 1512 01/13/22 2133 01/14/22 0032 01/14/22 0507  PROCALCITON  --   --   --  0.62  LATICACIDVEN 1.6 2.6* 0.9 0.9    Recent Results (from the past 240 hour(s))  Resp Panel by RT-PCR (Flu A&B, Covid) Anterior Nasal Swab     Status: None   Collection Time: 01/13/22  3:12 PM   Specimen: Anterior Nasal Swab  Result Value Ref Range Status   SARS Coronavirus 2 by RT PCR NEGATIVE NEGATIVE Final    Comment: (NOTE) SARS-CoV-2 target nucleic acids are NOT DETECTED.  The SARS-CoV-2 RNA is generally detectable in upper respiratory specimens during the acute phase of  infection. The lowest concentration of SARS-CoV-2 viral copies this assay can detect is 138 copies/mL. A negative result does not preclude SARS-Cov-2 infection and should not be used as the sole basis for treatment or other patient management decisions. A negative result may occur with  improper specimen collection/handling, submission of specimen other than nasopharyngeal swab, presence of viral mutation(s) within the areas targeted by this assay, and inadequate number of viral copies(<138 copies/mL). A negative result must be combined with clinical observations,  patient history, and epidemiological information. The expected result is Negative.  Fact Sheet for Patients:  EntrepreneurPulse.com.au  Fact Sheet for Healthcare Providers:  IncredibleEmployment.be  This test is no t yet approved or cleared by the [place] FDA and  has been authorized for detection and/or diagnosis of SARS-CoV-2 by FDA under an Emergency Use Authorization (EUA). This EUA will remain  in effect (meaning this test can be used) for the duration of the COVID-19 declaration under Section 564(b)(1) of the Act, 21 U.S.C.section 360bbb-3(b)(1), unless the authorization is terminated  or revoked sooner.       Influenza A by PCR NEGATIVE NEGATIVE Final   Influenza B by PCR NEGATIVE NEGATIVE Final    Comment: (NOTE) The Xpert Xpress SARS-CoV-2/FLU/RSV plus assay is intended as an aid in the diagnosis of influenza from Nasopharyngeal swab specimens and should not be used as a sole basis for treatment. Nasal washings and aspirates are unacceptable for Xpert Xpress SARS-CoV-2/FLU/RSV testing.  Fact Sheet for Patients: EntrepreneurPulse.com.au  Fact Sheet for Healthcare Providers: IncredibleEmployment.be  This test is not yet approved or cleared by the [place] FDA and has been authorized for detection and/or diagnosis of SARS-CoV-2 by FDA under an Emergency Use Authorization (EUA). This EUA will remain in effect (meaning this test can be used) for the duration of the COVID-19 declaration under Section 564(b)(1) of the Act, 21 U.S.C. section 360bbb-3(b)(1), unless the authorization is terminated or revoked.  Performed at [facility], [place] [address] [place], [place] [address]   Blood Culture (routine x 2)     Status: None (Preliminary result)   Collection Time: 01/13/22  3:12 PM   Specimen: BLOOD  Result Value Ref Range Status   Specimen Description   Final    BLOOD  SITE NOT SPECIFIED Performed at [facility] [address] [place], [place] [address]    Special Requests   Final    BOTTLES DRAWN AEROBIC AND ANAEROBIC Blood Culture adequate volume Performed at [facility] [address] [place], [place] [address]    Culture   Final    NO GROWTH < 24 HOURS Performed at [facility] [address], [place], [place] [address]    Report Status PENDING  Incomplete  Blood Culture (routine x 2)     Status: None (Preliminary result)   Collection Time: 01/13/22  3:40 PM   Specimen: BLOOD  Result Value Ref Range Status   Specimen Description   Final    BLOOD SITE NOT SPECIFIED Performed at [facility] [address] [place], [place] [address]    Special Requests   Final    BOTTLES DRAWN AEROBIC AND ANAEROBIC Blood Culture results may not be optimal due to an inadequate volume of blood received in culture bottles Performed at [facility] [address] [place],  [address]    Culture   Final    NO GROWTH < 12 HOURS Performed at [facility], [place] [address] [place], [place] [address]    Report Status PENDING  Incomplete     Scheduled Meds:  clopidogrel  75 mg Oral Daily   enoxaparin (LOVENOX) injection  40 mg Subcutaneous Q24H   feeding supplement  237 mL Oral BID BM   Continuous Infusions:  ceFEPime (MAXIPIME) IV 2 g (01/14/22 0338)   metronidazole 500 mg (01/14/22 0430)   [START ON 01/15/2022] vancomycin            Aline August, MD [facility] 01/14/2022, 11:21 AM

## 2022-01-14 NOTE — TOC Initial Note (Signed)
Transition of Care [facility]) - Initial/Assessment Note    Patient Details  Name: [name] MRN: [id number] Date of Birth: 12/15/1950  [facility] CM/SW Contact:    Vassie Moselle, LCSW Phone Number: 01/14/2022, 9:58 AM  Clinical Narrative:                 Pt coming from [facility] and is able to return to their facility once medically stable.   Expected Discharge Plan: Skilled Nursing Facility Barriers to Discharge: Continued Medical Work up   Patient Goals and CMS Choice     Choice offered to / list presented to : Patient  Expected Discharge Plan and Services Expected Discharge Plan: Skilled Nursing Facility In-house Referral: Clinical Social Work Discharge Planning Services: CM Consult Post Acute Care Choice: [place] Living arrangements for the past 2 months: [place]                 DME Arranged: N/A DME Agency: NA                  Prior Living Arrangements/Services Living arrangements for the past 2 months: [place] Lives with:: Facility Resident Patient language and need for interpreter reviewed:: Yes Do you feel safe going back to the place where you live?: Yes      Need for Family Participation in Patient Care: Yes (Comment) Care giver support system in place?: No (comment)   Criminal Activity/Legal Involvement Pertinent to Current Situation/Hospitalization: No - Comment as needed  Activities of Daily Living Home Assistive Devices/Equipment: Other (Comment) (pt unable to state what equipment he uses) ADL Screening (condition at time of admission) Patient's cognitive ability adequate to safely complete daily activities?: No Is the patient deaf or have difficulty hearing?: Yes (troubl) Does the patient have difficulty seeing, even when wearing glasses/contacts?: No Does the patient have difficulty concentrating, remembering, or making decisions?: Yes Patient able to express need for  assistance with ADLs?: Yes Does the patient have difficulty dressing or bathing?: Yes Independently performs ADLs?: No Communication: Independent Dressing (OT): Needs assistance Is this a change from baseline?: Pre-admission baseline Grooming: Independent Feeding: Independent Bathing: Needs assistance Is this a change from baseline?: Pre-admission baseline Toileting: Needs assistance Is this a change from baseline?: Pre-admission baseline In/Out Bed: Needs assistance Is this a change from baseline?: Pre-admission baseline Walks in Home: Dependent Is this a change from baseline?: Pre-admission baseline Does the patient have difficulty walking or climbing stairs?: Yes Weakness of Legs: Both Weakness of Arms/Hands: Both  Permission Sought/Granted                  Emotional Assessment       Orientation: : Oriented to Self Alcohol / Substance Use: Not Applicable Psych Involvement: No (comment)  Admission diagnosis:  Hypokalemia [E87.6] Urinary retention [R33.9] AKI (acute kidney injury) ([place]) [N17.9] Febrile illness [R50.9] AMS (altered mental status) [R41.82] Patient Active Problem List   Diagnosis Date Noted   AMS (altered mental status) 01/13/2022   Expressive aphasia 01/13/2022   Sepsis ([place]) 01/13/2022   History of CVA (cerebrovascular accident) 01/13/2022   Aortic root dilatation ([place]) 12/12/2021   Chronic heart failure with preserved ejection fraction (HFpEF) ([place]) 12/12/2021   Acute CVA (cerebrovascular accident) ([place]) 12/08/2021   Aortic insufficiency 11/30/2021   Right bundle branch block 11/30/2021   Olecranon bursitis, right elbow 06/13/2021   Rupture of UCL of left thumb 03/18/2021   Aortic atherosclerosis ([place]) 12/10/2020  Chronic pain of right knee 12/10/2020   Bilateral elbow joint pain 07/09/2020   Acute renal failure superimposed on chronic kidney disease ([place]) 11/12/2019   Dry eyes 11/12/2019   S/P hernia repair 10/17/2019   Abdominal pain  07/06/2019   BPH with obstruction/lower urinary tract symptoms 03/09/2019   Clot retention of urine 02/11/2019   Vitamin D deficiency 09/13/2017   Urinary tract infection without hematuria 07/23/2017   Low back pain 07/03/2017   Dizziness 06/30/2017   Acute gouty arthritis 12/01/2016   Degenerative arthritis of knee, bilateral 11/18/2016   Urinary retention due to benign prostatic hyperplasia 10/24/2016   Mass of right side of neck 10/20/2016   Gout 10/20/2016   Right knee pain 10/12/2016   Hypokalemia 05/28/2016   Encounter for well adult exam with abnormal findings 11/27/2015   Obstructive uropathy 11/27/2015   Elevated PSA 11/27/2015   Acute pyelonephritis 05/27/2015   Generalized bloating 05/27/2015   Chronic constipation 05/27/2015   Chest pain 05/27/2015   AKI (acute kidney injury) ([place]) 05/27/2015   Acute sinus infection 11/22/2014   Cough 09/25/2014   Hemorrhoids, internal, with bleeding, prolapse 09/19/2014   Chronic UTI 05/29/2014   Hyperlipidemia 05/29/2014   Lumbar radiculopathy 05/23/2014   Lumbar stenosis 11/20/2013   Abnormal glucose 11/06/2013   Unspecified hereditary and idiopathic peripheral neuropathy 01/22/2013   Cardiomyopathy due to hypertension ([place]) 12/28/2011   Chronic kidney disease, stage 3b ([place]) 11/24/2011   Hematuria 11/24/2011   Hydronephrosis 11/24/2011   Anemia 11/24/2011   BRADYCARDIA 02/05/2010   TINEA PEDIS 01/29/2010   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   PERIPHERAL EDEMA 01/29/2010   ABNORMAL ELECTROCARDIOGRAM 01/29/2010   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   Osteoarthrosis, generalized, 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   ONYCHOMYCOSIS, TOENAILS 10/02/2007   BPH (benign prostatic hyperplasia) 10/02/2007   Essential hypertension 03/06/2007   PCP:  Biagio Borg, MD Pharmacy:   [facility] [place] [address] Phone: [phone] Fax:  [phone]     Social Determinants of Health (SDOH) Interventions    Readmission Risk Interventions    10/22/2019   12:04 PM  Readmission Risk Prevention Plan  Transportation Screening Complete  PCP or Specialist Appt within 3-5 Days Not Complete  Not Complete comments pending medical stability  HRI or Home Care Consult Complete  Social Work Consult for [place] Planning/Counseling Complete  Palliative Care Screening Not Applicable  Medication Review (RN Care Manager) Referral to Pharmacy

## 2022-01-14 NOTE — Evaluation (Signed)
Clinical/Bedside Swallow Evaluation Patient Details  Name: [name] MRN: [id number] Date of Birth: 05-04-1951  Today's Date: 01/14/2022 Time: SLP Start Time (ACUTE ONLY): 1702 SLP Stop Time (ACUTE ONLY): 1715 SLP Time Calculation (min) (ACUTE ONLY): 13 min  Past Medical History:  Past Medical History:  Diagnosis Date   Anemia    normal Fe, nl B12, nl retic, nl EPO July '13   Aortic regurgitation    Moderate AI 04/17/19 echo   Blood transfusion without reported diagnosis    BPH (benign prostatic hyperplasia)    Chronic back pain    Chronic kidney disease    CKD III, obstructive nephropathy   Diverticulosis    Dysuria    Elevated PSA, greater than or equal to 20 ng/ml June '13   PSA 107   Hemorrhoids, internal, with bleeding, prolapse 09/19/2014   Hyperlipidemia 05/29/2014   Hypertension    Obstructive uropathy 11/27/2015   Pre-diabetes    per patient "reduced sugar intake"   Renal insufficiency    Tuberculosis    h/o PPD +   UTI (urinary tract infection)    Vitamin D deficiency 09/13/2017   Past Surgical History:  Past Surgical History:  Procedure Laterality Date   CARPAL TUNNEL RELEASE Right    COLONOSCOPY     HEMORRHOID BANDING     INSERTION OF MESH N/A 10/17/2019   Procedure: Insertion Of Mesh;  Surgeon: Ralene Ok, MD;  Location: [place];  Service: General;  Laterality: N/A;   SPLENECTOMY     XI ROBOTIC ASSISTED SIMPLE PROSTATECTOMY N/A 03/09/2019   Procedure: XI ROBOTIC ASSISTED SIMPLE PROSTATECTOMY;  Surgeon: Cleon Gustin, MD;  Location: [facility];  Service: Urology;  Laterality: N/A;   XI ROBOTIC ASSISTED VENTRAL HERNIA N/A 10/17/2019   Procedure: XI ROBOTIC ASSISTED INCISIONAL HERNIA REPAIR WITH MESH;  Surgeon: Ralene Ok, MD;  Location: [place];  Service: General;  Laterality: N/A;   HPI:  Pt is a 71 yo male adm to [facility] from [facility] with AMS - diagnosed with Acute metabolic encephalopathy, Possibly from fever/sepsis per md note.   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   He was recently hospitalized from 7/5-7/8 with expressive aphasia and right-sided weakness. MRI 01/14/2022 Resolving infarct in the left parietal lobe. No new acute infarct  compared with the recent MRI.    Assessment / Plan / Recommendation  Clinical Impression  Patient presents with normal oropharyngeal swallow based on clinical swallow evaluation. He was able to feed himself with his left hand *easier than right due to CVA impact* . No focal CN deficits present.  Slight orange tinged lingual coating present.   He was observed with water, medication with water with RN, applejuice and solids.  Adequate mastication with full oral clearance noted and pt easily passed 3 ounce water screen.  Despite his oral motor planning deficits, his swallow is intact. Recommend coninue regular/thin diet as tolerated. No SLP follow up for swallow indicated. SLP Visit Diagnosis: Dysphagia, unspecified (R13.10)    Aspiration Risk  Mild aspiration risk    Diet Recommendation Regular;Thin liquid   Liquid Administration via: Cup;Straw Medication Administration: Whole meds with liquid Supervision: Patient able to self feed Compensations: Slow rate;Small sips/bites Postural Changes: Seated upright at 90 degrees    Other  Recommendations Oral Care Recommendations: Oral care BID    Recommendations for follow up therapy are one component  of a multi-disciplinary discharge planning process, led by the attending physician.  Recommendations may be updated based on patient status, additional functional criteria and insurance authorization.  Follow up Recommendations No SLP follow up      Assistance Recommended at Discharge None  Functional Status Assessment Patient has not had a recent decline in their functional status  Frequency and Duration            Prognosis         Swallow Study   General Date of Onset: 01/14/22 HPI: Pt is a 71 yo male adm to [facility] from [facility] with AMS - diagnosed with Acute metabolic encephalopathy, Possibly from fever/sepsis per md note.   Pt has aphasia due to his recent left MCA branch CVA, Presented with fever, tachycardia, tachypnea and leukocytosis with possibility of sepsis from possible pneumonia.   He was recently hospitalized from 7/5-7/8 with expressive aphasia and right-sided weakness. MRI 01/14/2022 Resolving infarct in the left parietal lobe. No new acute infarct  compared with the recent MRI. Type of Study: Bedside Swallow Evaluation Previous Swallow Assessment: [facility] speech [facility]a/tx during july 2023 when he had cva Diet Prior to this Study: Regular;Thin liquids Temperature Spikes Noted: No Respiratory Status: Room air History of Recent Intubation: No Behavior/Cognition: Alert;Cooperative;Pleasant mood Oral Cavity Assessment: Other (comment) (coating on his tongue, provided dental brushing which he effectively conducted using left hand - dropped brush with his right hand from effects of cva) Oral Care Completed by SLP: Yes (pt conducted with slp assist) Oral Cavity - Dentition: Adequate natural dentition Vision: Functional for self-feeding Self-Feeding Abilities: Able to feed self Patient Positioning: Upright in bed Baseline Vocal Quality: Normal Volitional Cough: Strong Volitional Swallow: Unable to elicit    Oral/Motor/Sensory Function Overall Oral Motor/Sensory Function: Other (comment) (motor planning issues noted as pt evidenced by pt smiling instead of protruding his tongue - - even with visual cue to mimic -)   Ice Chips Ice chips: Not tested   Thin Liquid Thin Liquid: Within functional limits Presentation: Self Fed;Straw    Nectar Thick Nectar Thick Liquid: Not tested   Honey Thick Honey Thick Liquid: Not tested   Puree Puree: Not tested   Solid     Solid: Within  functional limits Presentation: Self Fredirick Lathe 01/14/2022,6:05 PM  Kathleen Lime, MS [place] Office [phone] Pager [phone]

## 2022-01-14 NOTE — Progress Notes (Signed)
Pt has not had any urine this evening despite getting LR at 159m/h, so this nurse bladder scanned the patient. Pt had 2365mon the bladder scan. Paged on call physician who wrote orders to straight cath patient as needed. This nurse performed an intermittent catheterization and drained 40025mrom bladder. Pt states he feels relief. Pt has history of BPH and this nurse will continue to monitor.

## 2022-01-14 NOTE — Progress Notes (Signed)
Initial Nutrition Assessment  DOCUMENTATION CODES:   Obesity unspecified  INTERVENTION:   -Ensure Plus High Protein po BID, each supplement provides 350 kcal and 20 grams of protein.    NUTRITION DIAGNOSIS:   Increased nutrient needs related to chronic illness as evidenced by estimated needs.  GOAL:   Patient will meet greater than or equal to 90% of their needs  MONITOR:   PO intake, Supplement acceptance, Labs, Weight trends, I & O's  REASON FOR ASSESSMENT:   Malnutrition Screening Tool    ASSESSMENT:   71 y.o. 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  Pt with expressive aphasia, difficult to understand, unable to gather history. Per chart review, pt currently consuming 100% of meals. Ensure supplements have been ordered. Noted allergies to beef and chicken, also pt doesn't consuming pork.  Per weight records, weight has been trending down but not significantly.  Medications reviewed.  Labs reviewed.  NUTRITION - FOCUSED PHYSICAL EXAM:  No depletions noted.  Diet Order:   Diet Order             Diet Heart Room service appropriate? Yes; Fluid consistency: Thin  Diet effective now                   EDUCATION NEEDS:   No education needs have been identified at this time  Skin:  Skin Assessment: Reviewed RN Assessment  Last BM:  8/10 -type 6  Height:   Ht Readings from Last 1 Encounters:  01/13/22 '6\' 1"'$  (1.854 m)    Weight:   Wt Readings from Last 1 Encounters:  01/13/22 115 kg    BMI:  Body mass index is 33.45 kg/m.  Estimated Nutritional Needs:   Kcal:  2000-2200  Protein:  95-105g  Fluid:  2L/day  Clayton Bibles, MS, RD, LDN Inpatient Clinical Dietitian Contact  information available via Amion

## 2022-01-15 DIAGNOSIS — N179 Acute kidney failure, unspecified: Secondary | ICD-10-CM | POA: Diagnosis not present

## 2022-01-15 DIAGNOSIS — N1832 Chronic kidney disease, stage 3b: Secondary | ICD-10-CM

## 2022-01-15 DIAGNOSIS — N401 Enlarged prostate with lower urinary tract symptoms: Secondary | ICD-10-CM | POA: Diagnosis not present

## 2022-01-15 DIAGNOSIS — I77810 Thoracic aortic ectasia: Secondary | ICD-10-CM | POA: Diagnosis not present

## 2022-01-15 DIAGNOSIS — G9341 Metabolic encephalopathy: Secondary | ICD-10-CM

## 2022-01-15 LAB — CBC WITH DIFFERENTIAL/PLATELET
Abs Immature Granulocytes: 0.06 10*3/uL (ref 0.00–0.07)
Basophils Absolute: 0 10*3/uL (ref 0.0–0.1)
Basophils Relative: 0 %
Eosinophils Absolute: 0 10*3/uL (ref 0.0–0.5)
Eosinophils Relative: 0 %
HCT: 36.6 % — ABNORMAL LOW (ref 39.0–52.0)
Hemoglobin: 12.1 g/dL — ABNORMAL LOW (ref 13.0–17.0)
Immature Granulocytes: 1 %
Lymphocytes Relative: 17 %
Lymphs Abs: 1.8 10*3/uL (ref 0.7–4.0)
MCH: 29 pg (ref 26.0–34.0)
MCHC: 33.1 g/dL (ref 30.0–36.0)
MCV: 87.8 fL (ref 80.0–100.0)
Monocytes Absolute: 1 10*3/uL (ref 0.1–1.0)
Monocytes Relative: 9 %
Neutro Abs: 8 10*3/uL — ABNORMAL HIGH (ref 1.7–7.7)
Neutrophils Relative %: 73 %
Platelets: 275 10*3/uL (ref 150–400)
RBC: 4.17 MIL/uL — ABNORMAL LOW (ref 4.22–5.81)
RDW: 14.8 % (ref 11.5–15.5)
WBC: 10.9 10*3/uL — ABNORMAL HIGH (ref 4.0–10.5)
nRBC: 0 % (ref 0.0–0.2)

## 2022-01-15 LAB — URINE CULTURE: Culture: NO GROWTH

## 2022-01-15 LAB — COMPREHENSIVE METABOLIC PANEL
ALT: 19 U/L (ref 0–44)
AST: 27 U/L (ref 15–41)
Albumin: 2.5 g/dL — ABNORMAL LOW (ref 3.5–5.0)
Alkaline Phosphatase: 79 U/L (ref 38–126)
Anion gap: 7 (ref 5–15)
BUN: 32 mg/dL — ABNORMAL HIGH (ref 8–23)
CO2: 26 mmol/L (ref 22–32)
Calcium: 8.4 mg/dL — ABNORMAL LOW (ref 8.9–10.3)
Chloride: 109 mmol/L (ref 98–111)
Creatinine, Ser: 2.13 mg/dL — ABNORMAL HIGH (ref 0.61–1.24)
GFR, Estimated: 32 mL/min — ABNORMAL LOW (ref >60)
Glucose, Bld: 108 mg/dL — ABNORMAL HIGH (ref 70–99)
Potassium: 3.1 mmol/L — ABNORMAL LOW (ref 3.5–5.1)
Sodium: 142 mmol/L (ref 135–145)
Total Bilirubin: 0.9 mg/dL (ref 0.3–1.2)
Total Protein: 6.2 g/dL — ABNORMAL LOW (ref 6.5–8.1)

## 2022-01-15 LAB — PROCALCITONIN: Procalcitonin: 0.29 ng/mL

## 2022-01-15 LAB — MAGNESIUM: Magnesium: 1.8 mg/dL (ref 1.7–2.4)

## 2022-01-15 MED ORDER — POTASSIUM CHLORIDE CRYS ER 20 MEQ PO TBCR
40.0000 meq | EXTENDED_RELEASE_TABLET | ORAL | Status: AC
  Administered 2022-01-15 (×2): 40 meq via ORAL
  Filled 2022-01-15 (×2): qty 2

## 2022-01-15 MED ORDER — SENNOSIDES-DOCUSATE SODIUM 8.6-50 MG PO TABS
1.0000 | ORAL_TABLET | Freq: Two times a day (BID) | ORAL | Status: DC
  Administered 2022-01-15 – 2022-01-17 (×5): 1 via ORAL
  Filled 2022-01-15 (×5): qty 1

## 2022-01-15 MED ORDER — TRAMADOL HCL 50 MG PO TABS
50.0000 mg | ORAL_TABLET | Freq: Two times a day (BID) | ORAL | Status: DC | PRN
  Administered 2022-01-15 – 2022-01-26 (×9): 50 mg via ORAL
  Filled 2022-01-15 (×9): qty 1

## 2022-01-15 NOTE — Evaluation (Signed)
Physical Therapy Evaluation Patient Details Name: [name] MRN: [id number] DOB: 10-30-1950 Today's Date: 01/15/2022  History of Present Illness  71 y.o. 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  On presentation, EMS reported temperature of 102.  In the ED, he was tachycardic, tachypneic with mild leukocytosis and creatinine elevated. Dx of acute metabolic encephalopathy, possible sepsis (PNA vs infectious diarrhea vs UTI).  Clinical Impression  Pt admitted with above diagnosis. Pt was unable to tolerate any mobility due to severe pain in B feet/LEs with minimal movement. He is oriented to self only, follows 1 step commands inconsistently. Noted expressive aphasia. Pt's baseline with mobility is unknown as he's unable to provide this and no family was present. Pt currently with functional limitations due to the deficits listed below (see PT Problem List). Pt will benefit from skilled PT to increase their independence and safety with mobility to allow discharge to the venue listed below.          Recommendations for follow up therapy are one component of a multi-disciplinary discharge planning process, led by the attending physician.  Recommendations may be updated based on patient status, additional functional criteria and insurance authorization.  Follow Up Recommendations Skilled nursing-short term rehab (<3 hours/day)      Assistance Recommended at Discharge Frequent or constant Supervision/Assistance  Patient can return home with the following  Two people to help with walking and/or transfers;Two people to help with bathing/dressing/bathroom;Assistance with cooking/housework;Direct supervision/assist for medications management;Assist  for transportation;Help with stairs or ramp for entrance;Direct supervision/assist for financial management    Equipment Recommendations None recommended by PT  Recommendations for Other Services       Functional Status Assessment Patient has had a recent decline in their functional status and demonstrates the ability to make significant improvements in function in a reasonable and predictable amount of time.     Precautions / Restrictions Precautions Precautions: Fall Restrictions Weight Bearing Restrictions: No      Mobility  Bed Mobility               General bed mobility comments: unable to perform 2* severe pain with attempted movement of BLEs    Transfers                   General transfer comment: unable to perform 2* severe pain with attempted movement of BLEs    Ambulation/Gait                  Stairs            Wheelchair Mobility    Modified Rankin (Stroke Patients Only)       Balance                                             Pertinent Vitals/Pain Pain Assessment Pain Assessment: Faces Faces Pain Scale: Hurts whole lot Breathing: occasional labored breathing, short period of hyperventilation Negative Vocalization: occasional moan/groan, low speech, negative/disapproving quality Facial Expression: sad, frightened, frown Body Language: tense, distressed pacing, fidgeting Consolability: distracted or reassured by voice/touch PAINAD Score: 5 Pain Location: B feet Pain Descriptors / Indicators: Grimacing, Guarding Pain Intervention(s): Limited  activity within patient's tolerance, Monitored during session, Premedicated before session, Other (comment) (pt had tylenol ~2 hrs prior to PT session, notified RN that pt's pain was not well managed)    Home Living Family/patient expects to be discharged to:: Skilled nursing facility                   Additional Comments: pt with poor cognition and unable  to answer any questions regarding home set-up or PLOF with no family/caregivers present throughout. Oriented to self only.    Prior Function Prior Level of Function : Patient poor historian/Family not available             Mobility Comments: unable to determine ADLs Comments: unable to determine     Hand Dominance   Dominant Hand: Right    Extremity/Trunk Assessment   Upper Extremity Assessment Upper Extremity Assessment: Defer to OT evaluation    Lower Extremity Assessment Lower Extremity Assessment: Difficult to assess due to impaired cognition;RLE deficits/detail;LLE deficits/detail RLE Deficits / Details: attempted to advance BLEs to EOB but pt c/o severe pain with minimal movement RLE: Unable to fully assess due to pain LLE Deficits / Details: attempted to advance BLEs to EOB but pt c/o severe pain with minimal movement LLE: Unable to fully assess due to pain       Communication   Communication: Expressive difficulties (expressive aphasia)  Cognition Arousal/Alertness: Awake/alert Behavior During Therapy: WFL for tasks assessed/performed Overall Cognitive Status: No family/caregiver present to determine baseline cognitive functioning                                 General Comments: oriented to self only, able to state his name but not his birthdate, not able to state location, inconsistent with following 1 step commands.        General Comments      Exercises     Assessment/Plan    PT Assessment Patient needs continued PT services  PT Problem List Decreased mobility;Decreased activity tolerance;Pain       PT Treatment Interventions Therapeutic activities;Therapeutic exercise;Functional mobility training;Balance training;Patient/family education;Gait training    PT Goals (Current goals can be found in the Care Plan section)  Acute Rehab PT Goals PT Goal Formulation: Patient unable to participate in goal setting Time For Goal Achievement:  01/29/22 Potential to Achieve Goals: Fair    Frequency Min 2X/week     Co-evaluation               AM-PAC PT "6 Clicks" Mobility  Outcome Measure Help needed turning from your back to your side while in a flat bed without using bedrails?: Total Help needed moving from lying on your back to sitting on the side of a flat bed without using bedrails?: Total Help needed moving to and from a bed to a chair (including a wheelchair)?: Total Help needed standing up from a chair using your arms (e.g., wheelchair or bedside chair)?: Total Help needed to walk in hospital room?: Total Help needed climbing 3-5 steps with a railing? : Total 6 Click Score: 6    End of Session   Activity Tolerance: Patient limited by pain Patient left: in bed;with bed alarm set;with call bell/phone within reach Nurse Communication: Mobility status;Other (comment) (pain not well managed) PT Visit Diagnosis: Difficulty in walking, not elsewhere classified (R26.2);Pain    Time: 1006-1020 PT Time Calculation (min) (ACUTE ONLY): 14 min   Charges:  PT Evaluation $PT Eval Moderate Complexity: 1 Mod         Philomena Doheny PT 01/15/2022  Acute Rehabilitation Services  Office [phone]

## 2022-01-15 NOTE — Plan of Care (Signed)

## 2022-01-15 NOTE — Progress Notes (Signed)
PROGRESS NOTE    [name]  PTW:[id number] DOB: [birth year] DOA: 01/13/2022 PCP: Biagio Borg, MD   Brief Narrative:  71 y.o. 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  On presentation, EMS reported temperature of 102.  In the ED, he was tachycardic, tachypneic with mild leukocytosis and creatinine elevated to 2.82 from prior of 1.96.  CT of abdomen and pelvis showed bibasilar atelectasis with superimposed developing infection/inflammation not excluded with fresh transition state of the colon, no diverticulitis.  He was started on IV fluids and antibiotics.  Assessment & Plan:   Acute metabolic encephalopathy -Possibly from fever/sepsis.   -Improving.  Monitor mental status.  Still has expressive aphasia which is probably his baseline. -Fall precautions. -Diet as per SLP recommendations. -PT eval.  Possible sepsis: Present on admission; unclear etiology Questionable bacterial pneumonia Questionable infective diarrhea Possible UTI: Present on admission -Presented with fever, tachycardia, tachypnea and leukocytosis with possibility of sepsis from possible pneumonia versus infective diarrhea versus UTI.  UA showing pyuria. -Blood and urine cultures negative so far.  No significant diarrhea since admission: GI PCR and stool for C. difficile testing have not been done because of the same.  CT of abdomen/pelvis as above. -Continue IV fluids and broad-spectrum antibiotics for now.  Vancomycin discontinued on 01/14/2022. -Temperature max of 100.3 over the last 24 hours.  Leukocytosis--resolved  Normocytic anemia --From anemia of chronic disease.  Hemoglobin stable.  Monitor  Acute kidney injury on CKD stage IIIb -Creatinine 2.83 on  presentation; was 1.96 on discharge last month -Currently on IV fluids.  Creatinine improving to 2.13 this morning.  Repeat a.m. labs.  No hydronephrosis on CT of abdomen. 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  Patient possibly has residual aphasia with aphasia and right-sided weakness from prior stroke.  Unclear if the patient is having a new stroke.  MRI of brain is pending. -Continue Plavix.  Outpatient follow-up with neurology.  Chronic diastolic heart failure -Currently compensated.  Strict input and output.  Daily weights.  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  Patient required in and out cath few times and subsequently Foley catheter placed on 01/14/2022.  Flomax started on 01/14/2022.  Will need outpatient urology follow-up.  Essential hypertension -Monitor blood pressure.  Physical deconditioning -PT eval. patient will have to return back to SNF.  TOC following.  Hypokalemia -Replace.  Repeat a.m. labs.  Obesity -Outpatient follow-up  DVT prophylaxis: Lovenox Code Status: Full Family Communication: None at bedside Disposition Plan: Status is: inpatient because: Of severity of illness.  Need for IV antibiotics and fluids.    Consultants: None  Procedures: None  Antimicrobials: Cefepime, Flagyl from 01/13/2022 onwards   Subjective: Patient seen and examined at bedside.  Poor historian.  Awake, tries to answer some questions but has expressive aphasia.  No overnight fever, vomiting, seizures reported.  Objective: Vitals:   01/14/22 1013 01/14/22 1512 01/14/22 2003 01/15/22 0701  BP: 133/80  124/85 (!) 152/82 (!) 141/89  Pulse:  82 81 86 (!) 102  Resp: '19 16 18 18  '$ Temp: 97.7 F (36.5 C) 98.3 F (36.8 C) 98.3 F (36.8 C) 100.3 F (37.9 C)  TempSrc: Oral Oral Oral Oral  SpO2: 96% 96% 97% 95%  Weight:      Height:        Intake/Output Summary (Last 24 hours) at 01/15/2022 0824 Last data filed at 01/15/2022 0721 Gross per 24 hour  Intake 3359.72 ml  Output 750 ml  Net 2609.72 ml    Filed Weights   01/13/22 1523  Weight: 115 kg    Examination:  General: On room air.  No distress.  Looks chronically ill and deconditioned. ENT/neck: No thyromegaly.  JVD is not elevated  respiratory: Decreased breath sounds at bases bilaterally with some crackles; no wheezing  CVS: S1-S2 heard, rate controlled Abdominal: Soft, nontender, slightly distended; no organomegaly, normal bowel sounds are heard Extremities: Trace lower extremity edema; no cyanosis  CNS: Awake, still has some expressive aphasia and speech is mostly incomprehensible.  Right-sided weakness present.   Lymph: No obvious lymphadenopathy Skin: No obvious ecchymosis/lesions  psych: Extremely flat affect.  Not agitated currently.   Musculoskeletal: No obvious joint swelling/deformity GU: foley catheter present     Data Reviewed: I have personally reviewed following labs and imaging studies  CBC: Recent Labs  Lab 01/13/22 1512 01/13/22 2132 01/14/22 0814 01/15/22 0651  WBC 13.7* 13.2* 13.6* 10.9*  NEUTROABS 11.6*  --  10.9* 8.0*  HGB 13.1 12.4* 11.5* 12.1*  HCT 39.6 37.6* 35.0* 36.6*  MCV 88.0 88.3 88.6 87.8  PLT 251 245 242 017    Basic Metabolic Panel: Recent Labs  Lab 01/13/22 1512 01/13/22 2132 01/14/22 0507 01/15/22 0651  NA 138 139 139 142  K 2.9* 3.2* 3.5 3.1*  CL 102 103 106 109  CO2 '25 25 26 26  '$ GLUCOSE 205* 185* 106* 108*  BUN 27* 27* 31* 32*  CREATININE 2.82* 2.83* 2.71* 2.13*  CALCIUM 8.5* 8.3* 8.2* 8.4*  MG 1.8  --  1.9 1.8    GFR: Estimated Creatinine Clearance: 42.2  mL/min (A) (by C-G formula based on SCr of 2.13 mg/dL (H)). Liver Function Tests: Recent Labs  Lab 01/13/22 1512 01/14/22 0507 01/15/22 0651  AST 33 29 27  ALT '21 16 19  '$ ALKPHOS 93 82 79  BILITOT 1.9* 1.0 0.9  PROT 6.9 5.8* 6.2*  ALBUMIN 3.1* 2.4* 2.5*    No results for input(s): "LIPASE", "AMYLASE" in the last 168 hours. Recent Labs  Lab 01/13/22 2133  AMMONIA <10    Coagulation Profile: Recent Labs  Lab 01/13/22 1512  INR 1.2    Cardiac Enzymes: No results for input(s): "CKTOTAL", "CKMB", "CKMBINDEX", "TROPONINI" in the last 168 hours. BNP (last 3 results) No results for input(s): "PROBNP" in the last 8760 hours. HbA1C: No results for input(s): "HGBA1C" in the last 72 hours. CBG: No results for input(s): "GLUCAP" in the last 168 hours. Lipid Profile: No results for input(s): "CHOL", "HDL", "LDLCALC", "TRIG", "CHOLHDL", "LDLDIRECT" in the last 72 hours. Thyroid Function Tests: No results for input(s): "TSH", "T4TOTAL", "FREET4", "T3FREE", "THYROIDAB" in the last 72 hours. Anemia Panel: No results for input(s): "VITAMINB12", "FOLATE", "FERRITIN", "TIBC", "IRON", "RETICCTPCT" in the last 72 hours. Sepsis Labs: Recent Labs  Lab 01/13/22 1512 01/13/22 2133 01/14/22 0032 01/14/22 0507 01/15/22 0651  PROCALCITON  --   --   --  0.62 0.29  LATICACIDVEN 1.6 2.6* 0.9 0.9  --      Recent  Results (from the past 240 hour(s))  Resp Panel by RT-PCR (Flu A&B, Covid) Anterior Nasal Swab     Status: None   Collection Time: 01/13/22  3:12 PM   Specimen: Anterior Nasal Swab  Result Value Ref Range Status   SARS Coronavirus 2 by RT PCR NEGATIVE NEGATIVE Final    Comment: (NOTE) SARS-CoV-2 target nucleic acids are NOT DETECTED.  The SARS-CoV-2 RNA is generally detectable in upper respiratory specimens during the acute phase of infection. The lowest concentration of SARS-CoV-2 viral copies this assay can detect is 138 copies/mL. A negative result does not preclude  SARS-Cov-2 infection and should not be used as the sole basis for treatment or other patient management decisions. A negative result may occur with  improper specimen collection/handling, submission of specimen other than nasopharyngeal swab, presence of viral mutation(s) within the areas targeted by this assay, and inadequate number of viral copies(<138 copies/mL). A negative result must be combined with clinical observations, patient history, and epidemiological information. The expected result is Negative.  Fact Sheet for Patients:  EntrepreneurPulse.com.au  Fact Sheet for Healthcare Providers:  IncredibleEmployment.be  This test is no t yet approved or cleared by the [place] FDA and  has been authorized for detection and/or diagnosis of SARS-CoV-2 by FDA under an Emergency Use Authorization (EUA). This EUA will remain  in effect (meaning this test can be used) for the duration of the COVID-19 declaration under Section 564(b)(1) of the Act, 21 U.S.C.section 360bbb-3(b)(1), unless the authorization is terminated  or revoked sooner.       Influenza A by PCR NEGATIVE NEGATIVE Final   Influenza B by PCR NEGATIVE NEGATIVE Final    Comment: (NOTE) The Xpert Xpress SARS-CoV-2/FLU/RSV plus assay is intended as an aid in the diagnosis of influenza from Nasopharyngeal swab specimens and should not be used as a sole basis for treatment. Nasal washings and aspirates are unacceptable for Xpert Xpress SARS-CoV-2/FLU/RSV testing.  Fact Sheet for Patients: EntrepreneurPulse.com.au  Fact Sheet for Healthcare Providers: IncredibleEmployment.be  This test is not yet approved or cleared by the [place] FDA and has been authorized for detection and/or diagnosis of SARS-CoV-2 by FDA under an Emergency Use Authorization (EUA). This EUA will remain in effect (meaning this test can be used) for the duration of  the COVID-19 declaration under Section 564(b)(1) of the Act, 21 U.S.C. section 360bbb-3(b)(1), unless the authorization is terminated or revoked.  Performed at [facility], [place] [address], [place], [place] [address]   Blood Culture (routine x 2)     Status: None (Preliminary result)   Collection Time: 01/13/22  3:12 PM   Specimen: BLOOD  Result Value Ref Range Status   Specimen Description   Final    BLOOD SITE NOT SPECIFIED Performed at [facility] [address] [place], [place] [address]    Special Requests   Final    BOTTLES DRAWN AEROBIC AND ANAEROBIC Blood Culture adequate volume Performed at [facility] [address] [place], [place] [address]    Culture   Final    NO GROWTH 2 DAYS Performed at [facility] [address], [place], [place] [address]    Report Status PENDING  Incomplete  Blood Culture (routine x 2)     Status: None (Preliminary result)   Collection Time: 01/13/22  3:40 PM   Specimen: BLOOD  Result Value Ref Range Status   Specimen Description   Final    BLOOD SITE  NOT SPECIFIED Performed at [facility] [address], [place], [place] [address]    Special Requests   Final    BOTTLES DRAWN AEROBIC AND ANAEROBIC Blood Culture results may not be optimal due to an inadequate volume of blood received in culture bottles Performed at [facility] [address], [place], [place] [address]    Culture   Final    NO GROWTH 2 DAYS Performed at [facility] [address] [place], [place] [address]    Report Status PENDING  Incomplete  MRSA Next Gen by PCR, Nasal     Status: None   Collection Time: 01/14/22 10:02 AM   Specimen: Nasal Mucosa; Nasal Swab  Result Value Ref Range Status   MRSA by PCR Next Gen NOT DETECTED NOT DETECTED Final    Comment: (NOTE) The GeneXpert MRSA Assay (FDA approved for NASAL specimens only), is one component  of a comprehensive MRSA colonization surveillance program. It is not intended to diagnose MRSA infection nor to guide or monitor treatment for MRSA infections. Test performance is not FDA approved in patients less than 17 years old. Performed at [facility], [place] [address], [place], [place] [address]      Scheduled Meds:  Chlorhexidine Gluconate Cloth  6 each Topical Daily   clopidogrel  75 mg Oral Daily   enoxaparin (LOVENOX) injection  40 mg Subcutaneous Q24H   feeding supplement  237 mL Oral BID BM   tamsulosin  0.4 mg Oral QPC supper   Continuous Infusions:  sodium chloride 100 mL/hr at 01/14/22 1232   ceFEPime (MAXIPIME) IV 2 g (01/15/22 0306)   metronidazole 500 mg (01/15/22 0357)          Aline August, MD [facility] 01/15/2022, 8:24 AM

## 2022-01-16 ENCOUNTER — Inpatient Hospital Stay (HOSPITAL_COMMUNITY): Payer: Medicare Other

## 2022-01-16 DIAGNOSIS — I77810 Thoracic aortic ectasia: Secondary | ICD-10-CM | POA: Diagnosis not present

## 2022-01-16 DIAGNOSIS — R338 Other retention of urine: Secondary | ICD-10-CM

## 2022-01-16 DIAGNOSIS — N179 Acute kidney failure, unspecified: Secondary | ICD-10-CM | POA: Diagnosis not present

## 2022-01-16 DIAGNOSIS — D72829 Elevated white blood cell count, unspecified: Secondary | ICD-10-CM

## 2022-01-16 DIAGNOSIS — G9341 Metabolic encephalopathy: Secondary | ICD-10-CM | POA: Diagnosis not present

## 2022-01-16 LAB — MAGNESIUM: Magnesium: 1.8 mg/dL (ref 1.7–2.4)

## 2022-01-16 LAB — CBC WITH DIFFERENTIAL/PLATELET
Abs Immature Granulocytes: 0.07 10*3/uL (ref 0.00–0.07)
Basophils Absolute: 0 10*3/uL (ref 0.0–0.1)
Basophils Relative: 0 %
Eosinophils Absolute: 0.1 10*3/uL (ref 0.0–0.5)
Eosinophils Relative: 1 %
HCT: 38.2 % — ABNORMAL LOW (ref 39.0–52.0)
Hemoglobin: 12.5 g/dL — ABNORMAL LOW (ref 13.0–17.0)
Immature Granulocytes: 1 %
Lymphocytes Relative: 18 %
Lymphs Abs: 1.9 10*3/uL (ref 0.7–4.0)
MCH: 29.3 pg (ref 26.0–34.0)
MCHC: 32.7 g/dL (ref 30.0–36.0)
MCV: 89.7 fL (ref 80.0–100.0)
Monocytes Absolute: 0.9 10*3/uL (ref 0.1–1.0)
Monocytes Relative: 9 %
Neutro Abs: 7.4 10*3/uL (ref 1.7–7.7)
Neutrophils Relative %: 71 %
Platelets: 325 10*3/uL (ref 150–400)
RBC: 4.26 MIL/uL (ref 4.22–5.81)
RDW: 15.2 % (ref 11.5–15.5)
WBC: 10.5 10*3/uL (ref 4.0–10.5)
nRBC: 0 % (ref 0.0–0.2)

## 2022-01-16 LAB — BASIC METABOLIC PANEL
Anion gap: 9 (ref 5–15)
BUN: 27 mg/dL — ABNORMAL HIGH (ref 8–23)
CO2: 24 mmol/L (ref 22–32)
Calcium: 8.5 mg/dL — ABNORMAL LOW (ref 8.9–10.3)
Chloride: 111 mmol/L (ref 98–111)
Creatinine, Ser: 1.84 mg/dL — ABNORMAL HIGH (ref 0.61–1.24)
GFR, Estimated: 39 mL/min — ABNORMAL LOW (ref >60)
Glucose, Bld: 112 mg/dL — ABNORMAL HIGH (ref 70–99)
Potassium: 3.4 mmol/L — ABNORMAL LOW (ref 3.5–5.1)
Sodium: 144 mmol/L (ref 135–145)

## 2022-01-16 LAB — PROCALCITONIN: Procalcitonin: 0.32 ng/mL

## 2022-01-16 MED ORDER — POLYETHYLENE GLYCOL 3350 17 G PO PACK
17.0000 g | PACK | Freq: Every day | ORAL | Status: DC
  Administered 2022-01-16 – 2022-01-27 (×10): 17 g via ORAL
  Filled 2022-01-16 (×11): qty 1

## 2022-01-16 MED ORDER — SODIUM CHLORIDE 0.9 % IV SOLN
2.0000 g | INTRAVENOUS | Status: AC
  Administered 2022-01-16 – 2022-01-17 (×2): 2 g via INTRAVENOUS
  Filled 2022-01-16 (×2): qty 20

## 2022-01-16 MED ORDER — MORPHINE SULFATE (PF) 2 MG/ML IV SOLN
2.0000 mg | INTRAVENOUS | Status: DC | PRN
  Administered 2022-01-16 – 2022-01-22 (×9): 2 mg via INTRAVENOUS
  Filled 2022-01-16 (×9): qty 1

## 2022-01-16 MED ORDER — BISACODYL 10 MG RE SUPP
10.0000 mg | Freq: Every day | RECTAL | Status: DC | PRN
  Filled 2022-01-16: qty 1

## 2022-01-16 MED ORDER — POTASSIUM CHLORIDE CRYS ER 20 MEQ PO TBCR
40.0000 meq | EXTENDED_RELEASE_TABLET | Freq: Once | ORAL | Status: AC
  Administered 2022-01-16: 40 meq via ORAL
  Filled 2022-01-16: qty 2

## 2022-01-16 NOTE — Progress Notes (Signed)
PROGRESS NOTE    [name]  IOE:[id number] DOB: 07-16-1950 DOA: 01/13/2022 PCP: Biagio Borg, MD   Brief Narrative:  71 y.o. 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  On presentation, EMS reported temperature of 102.  In the ED, he was tachycardic, tachypneic with mild leukocytosis and creatinine elevated to 2.82 from prior of 1.96.  CT of abdomen and pelvis showed bibasilar atelectasis with superimposed developing infection/inflammation not excluded with fresh transition state of the colon, no diverticulitis.  He was started on IV fluids and antibiotics.  Assessment & Plan:   Acute metabolic encephalopathy -Possibly from fever/sepsis.   -Improving.  Monitor mental status.  Still has expressive aphasia which is probably his baseline. -Fall precautions. -Diet as per SLP recommendations. -PT recommends SNF placement.  Possible sepsis: Present on admission; unclear etiology Questionable bacterial pneumonia Questionable infective diarrhea Possible UTI: Present on admission -Presented with fever, tachycardia, tachypnea and leukocytosis with possibility of sepsis from possible pneumonia versus infective diarrhea versus UTI.  UA showing pyuria. -Blood and urine cultures negative so far.  No significant diarrhea since admission: GI PCR and stool for C. difficile testing have not been done because of the same.  CT of abdomen/pelvis as above. -Continue cefepime.  Flagyl discontinued on 01/15/2022.  Vancomycin discontinued on 01/14/2022. -Temperature max of 100.3 over the last 24 hours.  Leukocytosis--resolved  Normocytic anemia --From anemia of chronic disease.  Hemoglobin stable.  Monitor  Acute kidney injury on CKD stage  IIIb -Creatinine 2.83 on presentation; was 1.96 on discharge last month -Currently on IV fluids.  Creatinine improving to 1.84 this morning.  Repeat a.m. labs.  No hydronephrosis on CT of abdomen. 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  Patient possibly has residual aphasia with aphasia and right-sided weakness from prior stroke.  Unclear if the patient is having a new stroke.  MRI of brain is pending. -Continue Plavix.  Outpatient follow-up with neurology.  Chronic diastolic heart failure -Currently compensated.  Strict input and output.  Daily weights.  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  Patient required in and out cath few times and subsequently Foley catheter placed on 01/14/2022.  Flomax started on 01/14/2022.  Will need outpatient urology follow-up.  Essential hypertension -Monitor blood pressure.  Physical deconditioning -PT eval. patient will have to return back to SNF.  TOC following.  Hypokalemia -Replace.  Repeat a.m. labs.  Obesity -Outpatient follow-up  DVT prophylaxis: Lovenox Code Status: Full Family Communication: None at bedside Disposition Plan: Status is: inpatient because: Of severity of illness.  Need for IV antibiotics and fluids.    Consultants: None  Procedures: None  Antimicrobials:  Anti-infectives (From admission, onward)    Start     Dose/Rate Route Frequency Ordered Stop   01/15/22 1600  vancomycin (VANCOREADY) IVPB 1500 mg/300 mL  Status:  Discontinued        1,500 mg 150 mL/hr over 120 Minutes Intravenous Every 48 hours 01/13/22 2204 01/14/22 1430   01/14/22  0400  metroNIDAZOLE (FLAGYL) IVPB 500 mg  Status:  Discontinued        500 mg 100 mL/hr over 60 Minutes Intravenous Every 12 hours 01/13/22 2136 01/15/22 1444   01/14/22 0400  ceFEPIme (MAXIPIME) 2 g in sodium chloride 0.9 % 100 mL IVPB        2 g 200 mL/hr over 30 Minutes Intravenous Every 12 hours 01/13/22 2204     01/13/22 1530  ceFEPIme (MAXIPIME) 2 g in sodium chloride 0.9 % 100 mL IVPB        2 g 200 mL/hr over 30 Minutes Intravenous  Once 01/13/22 1523 01/13/22 1617   01/13/22 1530  vancomycin (VANCOREADY) IVPB 2000 mg/400 mL        2,000 mg 200 mL/hr over 120 Minutes Intravenous  Once 01/13/22 1525 01/13/22 2016   01/13/22 1515  metroNIDAZOLE (FLAGYL) IVPB 500 mg        500 mg 100 mL/hr over 60 Minutes Intravenous  Once 01/13/22 1513 01/13/22 1722         Subjective: Patient seen and examined at bedside.  Poor historian.  Awake, tries to answer some questions but still has expressive aphasia.  Feels constipated.  No agitation, fever, vomiting or chest pain reported.  Objective: Vitals:   01/15/22 1319 01/15/22 2015 01/16/22 0500 01/16/22 0558  BP: (!) 146/93 (!) 165/95  (!) 156/94  Pulse: 93 91  (!) 109  Resp: 20 (!) 22  20  Temp: 98.9 F (37.2 C) 98.6 F (37 C)  100.1 F (37.8 C)  TempSrc: Oral Oral  Oral  SpO2: 96% 97%  95%  Weight:   118 kg   Height:        Intake/Output Summary (Last 24 hours) at 01/16/2022 0828 Last data filed at 01/16/2022 0500 Gross per 24 hour  Intake 2099.43 ml  Output 3800 ml  Net -1700.57 ml    Filed Weights   01/13/22 1523 01/16/22 0500  Weight: 115 kg 118 kg    Examination:  General: No acute distress currently.  Still on room air.  Looks chronically ill and deconditioned. ENT/neck: No palpable neck masses or JVD elevation respiratory: Bilateral decreased breath sounds at bases with scattered crackles CVS: Most rate controlled; S1 and S2 are heard  abdominal: Soft, nontender, still has some distention; no organomegaly,  bowel sounds heard normally  extremities: No clubbing; bilateral lower extremity edema present CNS: Alert; still has some expressive aphasia and speech is mostly incomprehensible.  Right-sided weakness still present.   Lymph: No cervical lymphadenopathy noted Skin: No obvious rashes/petechiae psych: Currently not agitated.  Flat affect mostly.    Musculoskeletal: No obvious joint tenderness/swelling GU: Still has Foley catheter     Data Reviewed: I have personally reviewed following labs and imaging studies  CBC: Recent Labs  Lab 01/13/22 1512 01/13/22 2132 01/14/22 0814 01/15/22 0651 01/16/22 0525  WBC 13.7* 13.2* 13.6* 10.9* 10.5  NEUTROABS 11.6*  --  10.9* 8.0* 7.4  HGB 13.1 12.4* 11.5* 12.1* 12.5*  HCT 39.6 37.6* 35.0* 36.6* 38.2*  MCV 88.0 88.3 88.6 87.8 89.7  PLT 251 245 242 275 211    Basic Metabolic Panel: Recent Labs  Lab 01/13/22 1512 01/13/22 2132 01/14/22 0507 01/15/22 0651 01/16/22 0525  NA 138 139 139 142 144  K 2.9* 3.2* 3.5 3.1* 3.4*  CL 102 103 106 109 111  CO2 '25 25 26 26 24  '$ GLUCOSE 205* 185* 106* 108* 112*  BUN 27* 27* 31* 32* 27*  CREATININE 2.82* 2.83* 2.71* 2.13* 1.84*  CALCIUM 8.5* 8.3* 8.2* 8.4* 8.5*  MG 1.8  --  1.9 1.8 1.8    GFR: Estimated Creatinine Clearance: 49.5 mL/min (A) (by C-G formula based on SCr of 1.84 mg/dL (H)). Liver Function Tests: Recent Labs  Lab 01/13/22 1512 01/14/22 0507 01/15/22 0651  AST 33 29 27  ALT '21 16 19  '$ ALKPHOS 93 82 79  BILITOT 1.9* 1.0 0.9  PROT 6.9 5.8* 6.2*  ALBUMIN 3.1* 2.4* 2.5*    No results for input(s): "LIPASE", "AMYLASE" in the last 168 hours. Recent Labs  Lab 01/13/22 2133  AMMONIA <10    Coagulation Profile: Recent Labs  Lab 01/13/22 1512  INR 1.2    Cardiac Enzymes: No results for input(s): "CKTOTAL", "CKMB", "CKMBINDEX", "TROPONINI" in the last 168 hours. BNP (last 3 results) No results for input(s): "PROBNP" in the last 8760 hours. HbA1C: No results for  input(s): "HGBA1C" in the last 72 hours. CBG: No results for input(s): "GLUCAP" in the last 168 hours. Lipid Profile: No results for input(s): "CHOL", "HDL", "LDLCALC", "TRIG", "CHOLHDL", "LDLDIRECT" in the last 72 hours. Thyroid Function Tests: No results for input(s): "TSH", "T4TOTAL", "FREET4", "T3FREE", "THYROIDAB" in the last 72 hours. Anemia Panel: No results for input(s): "VITAMINB12", "FOLATE", "FERRITIN", "TIBC", "IRON", "RETICCTPCT" in the last 72 hours. Sepsis Labs: Recent Labs  Lab 01/13/22 1512 01/13/22 2133 01/14/22 0032 01/14/22 0507 01/15/22 0651 01/16/22 0525  PROCALCITON  --   --   --  0.62 0.29 0.32  LATICACIDVEN 1.6 2.6* 0.9 0.9  --   --      Recent Results (from the past 240 hour(s))  Resp Panel by RT-PCR (Flu A&B, Covid) Anterior Nasal Swab     Status: None   Collection Time: 01/13/22  3:12 PM   Specimen: Anterior Nasal Swab  Result Value Ref Range Status   SARS Coronavirus 2 by RT PCR NEGATIVE NEGATIVE Final    Comment: (NOTE) SARS-CoV-2 target nucleic acids are NOT DETECTED.  The SARS-CoV-2 RNA is generally detectable in upper respiratory specimens during the acute phase of infection. The lowest concentration of SARS-CoV-2 viral copies this assay can detect is 138 copies/mL. A negative result does not preclude SARS-Cov-2 infection and should not be used as the sole basis for treatment or other patient management decisions. A negative result may occur with  improper specimen collection/handling, submission of specimen other than nasopharyngeal swab, presence of viral mutation(s) within the areas targeted by this assay, and inadequate number of viral copies(<138 copies/mL). A negative result must be combined with clinical observations, patient history, and epidemiological information. The expected result is Negative.  Fact Sheet for Patients:  EntrepreneurPulse.com.au  Fact Sheet for Healthcare Providers:   IncredibleEmployment.be  This test is no t yet approved or cleared by the [place] FDA and  has been authorized for detection and/or diagnosis of SARS-CoV-2 by FDA under an Emergency Use Authorization (EUA). This EUA will remain  in effect (meaning this test can be used) for the duration of the COVID-19 declaration under Section 564(b)(1) of the Act, 21 U.S.C.section 360bbb-3(b)(1), unless the authorization is terminated  or revoked sooner.       Influenza A by PCR NEGATIVE NEGATIVE Final   Influenza B by PCR NEGATIVE NEGATIVE Final    Comment: (NOTE) The Xpert Xpress SARS-CoV-2/FLU/RSV plus assay is intended as an aid in the diagnosis of influenza from Nasopharyngeal swab specimens and should not be used as a sole basis for treatment. Nasal washings  and aspirates are unacceptable for Xpert Xpress SARS-CoV-2/FLU/RSV testing.  Fact Sheet for Patients: EntrepreneurPulse.com.au  Fact Sheet for Healthcare Providers: IncredibleEmployment.be  This test is not yet approved or cleared by the [place] FDA and has been authorized for detection and/or diagnosis of SARS-CoV-2 by FDA under an Emergency Use Authorization (EUA). This EUA will remain in effect (meaning this test can be used) for the duration of the COVID-19 declaration under Section 564(b)(1) of the Act, 21 U.S.C. section 360bbb-3(b)(1), unless the authorization is terminated or revoked.  Performed at [facility], [place] [address], [place], [place] [address]   Blood Culture (routine x 2)     Status: None (Preliminary result)   Collection Time: 01/13/22  3:12 PM   Specimen: BLOOD  Result Value Ref Range Status   Specimen Description   Final    BLOOD SITE NOT SPECIFIED Performed at [facility] [address], [place], [place] [address]    Special Requests   Final    BOTTLES DRAWN AEROBIC AND ANAEROBIC Blood Culture  adequate volume Performed at [facility] [address], [place], [place] [address]    Culture   Final    NO GROWTH 3 DAYS Performed at [facility], [place] [address], [place], [place] [address]    Report Status PENDING  Incomplete  Blood Culture (routine x 2)     Status: None (Preliminary result)   Collection Time: 01/13/22  3:40 PM   Specimen: BLOOD  Result Value Ref Range Status   Specimen Description   Final    BLOOD SITE NOT SPECIFIED Performed at [facility] [address], [place], [place] [address]    Special Requests   Final    BOTTLES DRAWN AEROBIC AND ANAEROBIC Blood Culture results may not be optimal due to an inadequate volume of blood received in culture bottles Performed at [facility] [address] [place], [place] [address]    Culture   Final    NO GROWTH 3 DAYS Performed at [facility], [place] [address], [place], [place] [address]    Report Status PENDING  Incomplete  Urine Culture     Status: None   Collection Time: 01/13/22  6:45 PM   Specimen: In/Out Cath Urine  Result Value Ref Range Status   Specimen Description   Final    IN/OUT CATH URINE Performed at [facility] [address] [place], [place] [address]    Special Requests   Final    NONE Performed at [facility], [place] [address], [place], [place] [address]    Culture   Final    NO GROWTH Performed at [facility], [place] [address], [place], [place] [address]    Report Status 01/15/2022 FINAL  Final  MRSA Next Gen by PCR, Nasal     Status: None   Collection Time: 01/14/22 10:02 AM   Specimen: Nasal Mucosa; Nasal Swab  Result Value Ref Range Status   MRSA by PCR Next Gen NOT DETECTED NOT DETECTED Final    Comment: (NOTE) The GeneXpert MRSA Assay (FDA approved for NASAL specimens only), is one component of a comprehensive MRSA colonization surveillance program. It is not  intended to diagnose MRSA infection nor to guide or monitor treatment for MRSA infections. Test performance is not FDA approved in patients less than 57 years old. Performed at [facility], [place] [address] [place],  [place] [address]      Scheduled Meds:  Chlorhexidine Gluconate Cloth  6 each Topical Daily   clopidogrel  75 mg Oral Daily   enoxaparin (LOVENOX) injection  40 mg Subcutaneous Q24H   feeding supplement  237 mL Oral BID BM   senna-docusate  1 tablet Oral BID   tamsulosin  0.4 mg Oral QPC supper   Continuous Infusions:  sodium chloride 75 mL/hr at 01/15/22 0923   ceFEPime (MAXIPIME) IV 2 g (01/16/22 0311)          Aline August, MD [facility] 01/16/2022, 8:28 AM

## 2022-01-16 NOTE — Progress Notes (Signed)
Pt continues slightly tachy at 115 at this time. MD made aware. New orders received and noted.

## 2022-01-16 NOTE — Plan of Care (Signed)

## 2022-01-16 NOTE — Progress Notes (Signed)
Writer noted swelling to patient's right neck. MD made aware and new orders received and noted. Pt will receive CT can of this area.

## 2022-01-17 DIAGNOSIS — N179 Acute kidney failure, unspecified: Secondary | ICD-10-CM | POA: Diagnosis not present

## 2022-01-17 DIAGNOSIS — R338 Other retention of urine: Secondary | ICD-10-CM | POA: Diagnosis not present

## 2022-01-17 DIAGNOSIS — G9341 Metabolic encephalopathy: Secondary | ICD-10-CM | POA: Diagnosis not present

## 2022-01-17 DIAGNOSIS — I77810 Thoracic aortic ectasia: Secondary | ICD-10-CM | POA: Diagnosis not present

## 2022-01-17 LAB — CBC WITH DIFFERENTIAL/PLATELET
Abs Immature Granulocytes: 0.15 10*3/uL — ABNORMAL HIGH (ref 0.00–0.07)
Basophils Absolute: 0.1 10*3/uL (ref 0.0–0.1)
Basophils Relative: 1 %
Eosinophils Absolute: 0.1 10*3/uL (ref 0.0–0.5)
Eosinophils Relative: 1 %
HCT: 36.7 % — ABNORMAL LOW (ref 39.0–52.0)
Hemoglobin: 12 g/dL — ABNORMAL LOW (ref 13.0–17.0)
Immature Granulocytes: 1 %
Lymphocytes Relative: 17 %
Lymphs Abs: 2.3 10*3/uL (ref 0.7–4.0)
MCH: 28.7 pg (ref 26.0–34.0)
MCHC: 32.7 g/dL (ref 30.0–36.0)
MCV: 87.8 fL (ref 80.0–100.0)
Monocytes Absolute: 1.2 10*3/uL — ABNORMAL HIGH (ref 0.1–1.0)
Monocytes Relative: 9 %
Neutro Abs: 10.1 10*3/uL — ABNORMAL HIGH (ref 1.7–7.7)
Neutrophils Relative %: 71 %
Platelets: 380 10*3/uL (ref 150–400)
RBC: 4.18 MIL/uL — ABNORMAL LOW (ref 4.22–5.81)
RDW: 15.6 % — ABNORMAL HIGH (ref 11.5–15.5)
WBC: 13.9 10*3/uL — ABNORMAL HIGH (ref 4.0–10.5)
nRBC: 0 % (ref 0.0–0.2)

## 2022-01-17 LAB — COMPREHENSIVE METABOLIC PANEL
ALT: 19 U/L (ref 0–44)
AST: 26 U/L (ref 15–41)
Albumin: 2.3 g/dL — ABNORMAL LOW (ref 3.5–5.0)
Alkaline Phosphatase: 86 U/L (ref 38–126)
Anion gap: 7 (ref 5–15)
BUN: 28 mg/dL — ABNORMAL HIGH (ref 8–23)
CO2: 25 mmol/L (ref 22–32)
Calcium: 8.7 mg/dL — ABNORMAL LOW (ref 8.9–10.3)
Chloride: 111 mmol/L (ref 98–111)
Creatinine, Ser: 1.96 mg/dL — ABNORMAL HIGH (ref 0.61–1.24)
GFR, Estimated: 36 mL/min — ABNORMAL LOW (ref >60)
Glucose, Bld: 112 mg/dL — ABNORMAL HIGH (ref 70–99)
Potassium: 3.5 mmol/L (ref 3.5–5.1)
Sodium: 143 mmol/L (ref 135–145)
Total Bilirubin: 0.8 mg/dL (ref 0.3–1.2)
Total Protein: 6.2 g/dL — ABNORMAL LOW (ref 6.5–8.1)

## 2022-01-17 LAB — C-REACTIVE PROTEIN: CRP: 34.9 mg/dL — ABNORMAL HIGH (ref <1.0)

## 2022-01-17 LAB — MAGNESIUM: Magnesium: 1.9 mg/dL (ref 1.7–2.4)

## 2022-01-17 MED ORDER — SENNOSIDES-DOCUSATE SODIUM 8.6-50 MG PO TABS
2.0000 | ORAL_TABLET | Freq: Two times a day (BID) | ORAL | Status: DC
  Administered 2022-01-17 – 2022-01-27 (×15): 2 via ORAL
  Filled 2022-01-17 (×18): qty 2

## 2022-01-17 NOTE — Plan of Care (Signed)

## 2022-01-17 NOTE — Progress Notes (Signed)
PROGRESS NOTE    [name]  JHE:[id number] DOB: 07-23-1950 DOA: 01/13/2022 PCP: Biagio Borg, MD   Brief Narrative:  71 y.o. 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  On presentation, EMS reported temperature of 102.  In the ED, he was tachycardic, tachypneic with mild leukocytosis and creatinine elevated to 2.82 from prior of 1.96.  CT of abdomen and pelvis showed bibasilar atelectasis with superimposed developing infection/inflammation not excluded with fresh transition state of the colon, no diverticulitis.  He was started on IV fluids and antibiotics.  Assessment & Plan:   Acute metabolic encephalopathy -Possibly from fever/sepsis.   -Improving.  Monitor mental status.  Still has expressive aphasia which is probably his baseline. -Fall precautions. -Diet as per SLP recommendations. -PT recommends SNF placement.  Possible sepsis: Present on admission; unclear etiology Questionable bacterial pneumonia Questionable infective diarrhea Possible UTI: Present on admission -Presented with fever, tachycardia, tachypnea and leukocytosis with possibility of sepsis from possible pneumonia versus infective diarrhea versus UTI.  UA showing pyuria. -Blood and urine cultures negative so far.  No significant diarrhea since admission: GI PCR and stool for C. difficile testing have not been done because of the same.  CT of abdomen/pelvis as above. -Cefepime has been switched to IV Rocephin.  Flagyl discontinued on 01/15/2022.  Vancomycin discontinued on 01/14/2022. -No temperature spikes over the last 24 hours.  Leukocytosis--mild.  Monitor intermittently.  Normocytic anemia --From anemia of chronic disease.  Hemoglobin stable.  Monitor  Acute  kidney injury on CKD stage IIIb -Creatinine 2.83 on presentation; was 1.96 on discharge last month -Treated with IV fluids.  Creatinine 1.96 this morning.  Repeat a.m. labs.  No hydronephrosis on CT of abdomen. -IV fluids have been discontinued.   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  Patient possibly has residual aphasia with aphasia and right-sided weakness from prior stroke.  Unclear if the patient is having a new stroke.  MRI of brain is pending. -Continue Plavix.  Outpatient follow-up with neurology.  Chronic diastolic heart failure -Currently compensated.  Strict input and output.  Daily weights.  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  Patient required in and out cath few times and subsequently Foley catheter placed on 01/14/2022.  Flomax started on 01/14/2022.  Will need outpatient urology follow-up.  Essential hypertension -Monitor blood pressure.  Physical deconditioning -PT eval. patient will have to return back to SNF.  TOC following.  Hypokalemia -Improved.  Obesity -Outpatient follow-up  Chronic knee pain -Follows up with orthopedic/sports medicine as an outpatient for recurrent steroid injections.  Outpatient follow-up with orthopedics.  DVT prophylaxis: Lovenox Code Status: Full Family Communication: None at bedside Disposition Plan: Status is: inpatient because: Of severity of illness.  Need for IV antibiotics.  Possible discharge in 1 to 2 days if clinically improves.  Consultants: None  Procedures: None  Antimicrobials:  Anti-infectives (From admission, onward)    Start     Dose/Rate Route Frequency Ordered Stop    01/16/22 1500  cefTRIAXone (ROCEPHIN) 2 g in sodium chloride  0.9 % 100 mL IVPB        2 g 200 mL/hr over 30 Minutes Intravenous Every 24 hours 01/16/22 1343 01/18/22 1459   01/15/22 1600  vancomycin (VANCOREADY) IVPB 1500 mg/300 mL  Status:  Discontinued        1,500 mg 150 mL/hr over 120 Minutes Intravenous Every 48 hours 01/13/22 2204 01/14/22 1430   01/14/22 0400  metroNIDAZOLE (FLAGYL) IVPB 500 mg  Status:  Discontinued        500 mg 100 mL/hr over 60 Minutes Intravenous Every 12 hours 01/13/22 2136 01/15/22 1444   01/14/22 0400  ceFEPIme (MAXIPIME) 2 g in sodium chloride 0.9 % 100 mL IVPB  Status:  Discontinued        2 g 200 mL/hr over 30 Minutes Intravenous Every 12 hours 01/13/22 2204 01/16/22 1343   01/13/22 1530  ceFEPIme (MAXIPIME) 2 g in sodium chloride 0.9 % 100 mL IVPB        2 g 200 mL/hr over 30 Minutes Intravenous  Once 01/13/22 1523 01/13/22 1617   01/13/22 1530  vancomycin (VANCOREADY) IVPB 2000 mg/400 mL        2,000 mg 200 mL/hr over 120 Minutes Intravenous  Once 01/13/22 1525 01/13/22 2016   01/13/22 1515  metroNIDAZOLE (FLAGYL) IVPB 500 mg        500 mg 100 mL/hr over 60 Minutes Intravenous  Once 01/13/22 1513 01/13/22 1722         Subjective: Patient seen and examined at bedside.  Poor historian.  Awake, tries to answer some questions but still has expressive aphasia.  No seizures, vomiting or chest pain reported. Objective: Vitals:   01/16/22 1353 01/16/22 2017 01/17/22 0343 01/17/22 0400  BP: (!) 147/95 (!) 144/93 (!) 139/91   Pulse: (!) 102 (!) 109 100   Resp: 20 20    Temp: 98.5 F (36.9 C) 99.1 F (37.3 C) 98.6 F (37 C)   TempSrc: Oral Oral Oral   SpO2:  94% 97%   Weight:    118.2 kg  Height:        Intake/Output Summary (Last 24 hours) at 01/17/2022 0832 Last data filed at 01/17/2022 0500 Gross per 24 hour  Intake 220 ml  Output 2950 ml  Net -2730 ml    Filed Weights   01/13/22 1523 01/16/22 0500 01/17/22 0400  Weight: 115 kg 118 kg  118.2 kg    Examination:  General: Room air currently.  No distress.  Looks chronically ill and deconditioned. ENT/neck: JVD not elevated.  Right and left mandible/upper neck soft mass palpable respiratory: Decreased breath sounds at bases bilaterally, no wheezing  CVS: S1-S2 heard; intermittently tachycardic  abdominal: Soft, nontender, mild distention present; no organomegaly, normal bowel sounds heard.   Extremities: Trace bilateral lower extremity edema present; no cyanosis  CNS: Awake; still has some expressive aphasia and speech is mostly incomprehensible.  Right-sided weakness still present.   Lymph: No palpable lymphadenopathy noted  skin: No obvious ecchymosis/ulcers  psych: Affect is flat.  Not agitated Musculoskeletal: Mild bilateral knee tenderness and swelling present. GU: Indwelling foley catheter present.    Data Reviewed: I have personally reviewed following labs and imaging studies  CBC: Recent Labs  Lab 01/13/22 1512 01/13/22 2132 01/14/22 0814 01/15/22 0651 01/16/22 0525 01/17/22 0609  WBC 13.7* 13.2* 13.6* 10.9* 10.5 13.9*  NEUTROABS 11.6*  --  10.9* 8.0* 7.4 10.1*  HGB 13.1 12.4* 11.5* 12.1* 12.5* 12.0*  HCT 39.6 37.6* 35.0* 36.6* 38.2* 36.7*  MCV 88.0 88.3 88.6 87.8 89.7 87.8  PLT 251 245 242 275 325 833    Basic Metabolic Panel: Recent Labs  Lab 01/13/22 1512 01/13/22 2132 01/14/22 0507 01/15/22 0651 01/16/22 0525 01/17/22 0609  NA 138 139 139 142 144 143  K 2.9* 3.2* 3.5 3.1* 3.4* 3.5  CL 102 103 106 109 111 111  CO2 '25 25 26 26 24 25  '$ GLUCOSE 205* 185* 106* 108* 112* 112*  BUN 27* 27* 31* 32* 27* 28*  CREATININE 2.82* 2.83* 2.71* 2.13* 1.84* 1.96*  CALCIUM 8.5* 8.3* 8.2* 8.4* 8.5* 8.7*  MG 1.8  --  1.9 1.8 1.8 1.9    GFR: Estimated Creatinine Clearance: 46.5 mL/min (A) (by C-G formula based on SCr of 1.96 mg/dL (H)). Liver Function Tests: Recent Labs  Lab 01/13/22 1512 01/14/22 0507 01/15/22 0651 01/17/22 0609  AST 33 '29 27  26  '$ ALT '21 16 19 19  '$ ALKPHOS 93 82 79 86  BILITOT 1.9* 1.0 0.9 0.8  PROT 6.9 5.8* 6.2* 6.2*  ALBUMIN 3.1* 2.4* 2.5* 2.3*    No results for input(s): "LIPASE", "AMYLASE" in the last 168 hours. Recent Labs  Lab 01/13/22 2133  AMMONIA <10    Coagulation Profile: Recent Labs  Lab 01/13/22 1512  INR 1.2    Cardiac Enzymes: No results for input(s): "CKTOTAL", "CKMB", "CKMBINDEX", "TROPONINI" in the last 168 hours. BNP (last 3 results) No results for input(s): "PROBNP" in the last 8760 hours. HbA1C: No results for input(s): "HGBA1C" in the last 72 hours. CBG: No results for input(s): "GLUCAP" in the last 168 hours. Lipid Profile: No results for input(s): "CHOL", "HDL", "LDLCALC", "TRIG", "CHOLHDL", "LDLDIRECT" in the last 72 hours. Thyroid Function Tests: No results for input(s): "TSH", "T4TOTAL", "FREET4", "T3FREE", "THYROIDAB" in the last 72 hours. Anemia Panel: No results for input(s): "VITAMINB12", "FOLATE", "FERRITIN", "TIBC", "IRON", "RETICCTPCT" in the last 72 hours. Sepsis Labs: Recent Labs  Lab 01/13/22 1512 01/13/22 2133 01/14/22 0032 01/14/22 0507 01/15/22 0651 01/16/22 0525  PROCALCITON  --   --   --  0.62 0.29 0.32  LATICACIDVEN 1.6 2.6* 0.9 0.9  --   --      Recent Results (from the past 240 hour(s))  Resp Panel by RT-PCR (Flu A&B, Covid) Anterior Nasal Swab     Status: None   Collection Time: 01/13/22  3:12 PM   Specimen: Anterior Nasal Swab  Result Value Ref Range Status   SARS Coronavirus 2 by RT PCR NEGATIVE NEGATIVE Final    Comment: (NOTE) SARS-CoV-2 target nucleic acids are NOT DETECTED.  The SARS-CoV-2 RNA is generally detectable in upper respiratory specimens during the acute phase of infection. The lowest concentration of SARS-CoV-2 viral copies this assay can detect is 138 copies/mL. A negative result does not preclude SARS-Cov-2 infection and should not be used as the sole basis for treatment or other patient management decisions. A  negative result may occur with  improper specimen collection/handling, submission of specimen other than nasopharyngeal swab, presence of viral mutation(s) within the areas targeted by this assay, and inadequate number of viral copies(<138 copies/mL). A negative result must be combined with clinical observations, patient history, and epidemiological information. The expected result is Negative.  Fact Sheet for Patients:  EntrepreneurPulse.com.au  Fact Sheet for Healthcare Providers:  IncredibleEmployment.be  This test is no t yet approved or cleared by the [place] FDA and  has been authorized for detection and/or diagnosis of SARS-CoV-2 by FDA under an Emergency Use Authorization (EUA). This  EUA will remain  in effect (meaning this test can be used) for the duration of the COVID-19 declaration under Section 564(b)(1) of the Act, 21 U.S.C.section 360bbb-3(b)(1), unless the authorization is terminated  or revoked sooner.       Influenza A by PCR NEGATIVE NEGATIVE Final   Influenza B by PCR NEGATIVE NEGATIVE Final    Comment: (NOTE) The Xpert Xpress SARS-CoV-2/FLU/RSV plus assay is intended as an aid in the diagnosis of influenza from Nasopharyngeal swab specimens and should not be used as a sole basis for treatment. Nasal washings and aspirates are unacceptable for Xpert Xpress SARS-CoV-2/FLU/RSV testing.  Fact Sheet for Patients: EntrepreneurPulse.com.au  Fact Sheet for Healthcare Providers: IncredibleEmployment.be  This test is not yet approved or cleared by the [place] FDA and has been authorized for detection and/or diagnosis of SARS-CoV-2 by FDA under an Emergency Use Authorization (EUA). This EUA will remain in effect (meaning this test can be used) for the duration of the COVID-19 declaration under Section 564(b)(1) of the Act, 21 U.S.C. section 360bbb-3(b)(1), unless the authorization  is terminated or revoked.  Performed at [facility], [place] [address] [place], [place] [address]   Blood Culture (routine x 2)     Status: None (Preliminary result)   Collection Time: 01/13/22  3:12 PM   Specimen: BLOOD  Result Value Ref Range Status   Specimen Description   Final    BLOOD SITE NOT SPECIFIED Performed at [place] [address], [place], [place] [address]    Special Requests   Final    BOTTLES DRAWN AEROBIC AND ANAEROBIC Blood Culture adequate volume Performed at [place] [address], [place], [place] [address]    Culture   Final    NO GROWTH 3 DAYS Performed at [facility], [place] [address] [place], [place] [address]    Report Status PENDING  Incomplete  Blood Culture (routine x 2)     Status: None (Preliminary result)   Collection Time: 01/13/22  3:40 PM   Specimen: BLOOD  Result Value Ref Range Status   Specimen Description   Final    BLOOD SITE NOT SPECIFIED Performed at [place] [address] [place], [place] [address]    Special Requests   Final    BOTTLES DRAWN AEROBIC AND ANAEROBIC Blood Culture results may not be optimal due to an inadequate volume of blood received in culture bottles Performed at [place] [address] [place], [place] [address]    Culture   Final    NO GROWTH 3 DAYS Performed at [facility], [place] [address] [place], [place] [address]    Report Status PENDING  Incomplete  Urine Culture     Status: None   Collection Time: 01/13/22  6:45 PM   Specimen: In/Out Cath Urine  Result Value Ref Range Status   Specimen Description   Final    IN/OUT CATH URINE Performed at [place] [address] [place], [place] [address]    Special Requests   Final    NONE Performed at [facility], [place] [address] [place], [place] [address]    Culture   Final    NO  GROWTH Performed at [facility], [place] [address], [place], [place] [address]    Report Status 01/15/2022 FINAL  Final  MRSA Next Gen by PCR, Nasal     Status: None  Collection Time: 01/14/22 10:02 AM   Specimen: Nasal Mucosa; Nasal Swab  Result Value Ref Range Status   MRSA by PCR Next Gen NOT DETECTED NOT DETECTED Final    Comment: (NOTE) The GeneXpert MRSA Assay (FDA approved for NASAL specimens only), is one component of a comprehensive MRSA colonization surveillance program. It is not intended to diagnose MRSA infection nor to guide or monitor treatment for MRSA infections. Test performance is not FDA approved in patients less than 31 years old. Performed at [facility], [place] [address] [place], [place] [address]      Scheduled Meds:  Chlorhexidine Gluconate Cloth  6 each Topical Daily   clopidogrel  75 mg Oral Daily   enoxaparin (LOVENOX) injection  40 mg Subcutaneous Q24H   feeding supplement  237 mL Oral BID BM   polyethylene glycol  17 g Oral Daily   senna-docusate  1 tablet Oral BID   tamsulosin  0.4 mg Oral QPC supper   Continuous Infusions:  cefTRIAXone (ROCEPHIN)  IV Stopped (01/16/22 1522)          Aline August, MD [facility] 01/17/2022, 8:32 AM

## 2022-01-18 DIAGNOSIS — R338 Other retention of urine: Secondary | ICD-10-CM | POA: Diagnosis not present

## 2022-01-18 DIAGNOSIS — G9341 Metabolic encephalopathy: Secondary | ICD-10-CM | POA: Diagnosis not present

## 2022-01-18 DIAGNOSIS — N179 Acute kidney failure, unspecified: Secondary | ICD-10-CM | POA: Diagnosis not present

## 2022-01-18 DIAGNOSIS — I77810 Thoracic aortic ectasia: Secondary | ICD-10-CM | POA: Diagnosis not present

## 2022-01-18 LAB — CBC WITH DIFFERENTIAL/PLATELET
Abs Immature Granulocytes: 0.2 10*3/uL — ABNORMAL HIGH (ref 0.00–0.07)
Basophils Absolute: 0.1 10*3/uL (ref 0.0–0.1)
Basophils Relative: 1 %
Eosinophils Absolute: 0.1 10*3/uL (ref 0.0–0.5)
Eosinophils Relative: 1 %
HCT: 36.5 % — ABNORMAL LOW (ref 39.0–52.0)
Hemoglobin: 12.1 g/dL — ABNORMAL LOW (ref 13.0–17.0)
Immature Granulocytes: 1 %
Lymphocytes Relative: 16 %
Lymphs Abs: 2.4 10*3/uL (ref 0.7–4.0)
MCH: 29.4 pg (ref 26.0–34.0)
MCHC: 33.2 g/dL (ref 30.0–36.0)
MCV: 88.8 fL (ref 80.0–100.0)
Monocytes Absolute: 1.2 10*3/uL — ABNORMAL HIGH (ref 0.1–1.0)
Monocytes Relative: 8 %
Neutro Abs: 11.4 10*3/uL — ABNORMAL HIGH (ref 1.7–7.7)
Neutrophils Relative %: 73 %
Platelets: 429 10*3/uL — ABNORMAL HIGH (ref 150–400)
RBC: 4.11 MIL/uL — ABNORMAL LOW (ref 4.22–5.81)
RDW: 15.9 % — ABNORMAL HIGH (ref 11.5–15.5)
WBC: 15.4 10*3/uL — ABNORMAL HIGH (ref 4.0–10.5)
nRBC: 0 % (ref 0.0–0.2)

## 2022-01-18 LAB — CULTURE, BLOOD (ROUTINE X 2)
Culture: NO GROWTH
Culture: NO GROWTH
Special Requests: ADEQUATE

## 2022-01-18 LAB — BASIC METABOLIC PANEL
Anion gap: 9 (ref 5–15)
BUN: 34 mg/dL — ABNORMAL HIGH (ref 8–23)
CO2: 24 mmol/L (ref 22–32)
Calcium: 8.6 mg/dL — ABNORMAL LOW (ref 8.9–10.3)
Chloride: 110 mmol/L (ref 98–111)
Creatinine, Ser: 2.03 mg/dL — ABNORMAL HIGH (ref 0.61–1.24)
GFR, Estimated: 34 mL/min — ABNORMAL LOW (ref >60)
Glucose, Bld: 96 mg/dL (ref 70–99)
Potassium: 3.3 mmol/L — ABNORMAL LOW (ref 3.5–5.1)
Sodium: 143 mmol/L (ref 135–145)

## 2022-01-18 LAB — MAGNESIUM: Magnesium: 2 mg/dL (ref 1.7–2.4)

## 2022-01-18 MED ORDER — SODIUM CHLORIDE 0.9 % IV SOLN: INTRAVENOUS | Status: DC | PRN

## 2022-01-18 MED ORDER — POTASSIUM CHLORIDE CRYS ER 20 MEQ PO TBCR
40.0000 meq | EXTENDED_RELEASE_TABLET | Freq: Once | ORAL | Status: AC
  Administered 2022-01-18: 40 meq via ORAL
  Filled 2022-01-18: qty 2

## 2022-01-18 MED ORDER — SODIUM CHLORIDE 0.9 % IV SOLN
2.0000 g | Freq: Every day | INTRAVENOUS | Status: DC
  Administered 2022-01-18 – 2022-01-19 (×2): 2 g via INTRAVENOUS
  Filled 2022-01-18 (×2): qty 20

## 2022-01-18 MED ORDER — FUROSEMIDE 10 MG/ML IJ SOLN
60.0000 mg | Freq: Once | INTRAMUSCULAR | Status: AC
  Administered 2022-01-18: 60 mg via INTRAVENOUS
  Filled 2022-01-18: qty 6

## 2022-01-18 NOTE — NC FL2 (Signed)
[place] LEVEL OF CARE SCREENING TOOL     IDENTIFICATION  Patient Name: [name] Birthdate: 01-28-1951 Sex: male Admission Date (Current Location): 01/13/2022  [facility] and [place] Number:  Herbalist and Address:  [facility],  [place], [place]      Provider Number: [id number]  Attending Physician Name and Address:  Aline August, MD  Relative Name and Phone Number:  Ladonna Snide [phone]    Current Level of Care: Hospital Recommended Level of Care: [place] Prior Approval Number:    Date Approved/Denied:   PASRR Number: [id number]  Discharge Plan: SNF    Current Diagnoses: Patient Active Problem List   Diagnosis Date Noted   Acute metabolic encephalopathy 11/28/7626   Leukocytosis 01/14/2022   Obesity (BMI 30-39.9) 01/14/2022   Physical deconditioning 01/14/2022   AMS (altered mental status) 01/13/2022   Expressive aphasia 01/13/2022   Sepsis ([place]) 01/13/2022   History of CVA (cerebrovascular accident) 01/13/2022   Aortic root dilatation ([place]) 12/12/2021   Chronic heart failure with preserved ejection fraction (HFpEF) ([place]) 12/12/2021   Acute CVA (cerebrovascular accident) ([place]) 12/08/2021   Aortic insufficiency 11/30/2021   Right bundle branch block 11/30/2021   Olecranon bursitis, right elbow 06/13/2021   Rupture of UCL of left thumb 03/18/2021   Aortic atherosclerosis ([place]) 12/10/2020   Chronic pain of right knee 12/10/2020   Bilateral elbow joint pain 07/09/2020   Acute renal failure superimposed on chronic kidney disease ([place]) 11/12/2019   Dry eyes 11/12/2019   S/P hernia repair 10/17/2019   Abdominal pain 07/06/2019   BPH with obstruction/lower urinary tract symptoms 03/09/2019   Clot retention of urine 02/11/2019   Vitamin D deficiency 09/13/2017   Urinary tract infection without hematuria 07/23/2017   Low back pain 07/03/2017   Dizziness 06/30/2017   Acute gouty  arthritis 12/01/2016   Degenerative arthritis of knee, bilateral 11/18/2016   Acute urinary retention 10/24/2016   Mass of right side of neck 10/20/2016   Gout 10/20/2016   Right knee pain 10/12/2016   Hypokalemia 05/28/2016   Encounter for well adult exam with abnormal findings 11/27/2015   Obstructive uropathy 11/27/2015   Elevated PSA 11/27/2015   Acute pyelonephritis 05/27/2015   Generalized bloating 05/27/2015   Chronic constipation 05/27/2015   Chest pain 05/27/2015   AKI (acute kidney injury) ([place]) 05/27/2015   Acute sinus infection 11/22/2014   Cough 09/25/2014   Hemorrhoids, internal, with bleeding, prolapse 09/19/2014   Chronic UTI 05/29/2014   Hyperlipidemia 05/29/2014   Lumbar radiculopathy 05/23/2014   Lumbar stenosis 11/20/2013   Abnormal glucose 11/06/2013   Unspecified hereditary and idiopathic peripheral neuropathy 01/22/2013   Cardiomyopathy due to hypertension ([place]) 12/28/2011   Chronic kidney disease, stage 3b ([place]) 11/24/2011   Hematuria 11/24/2011   Hydronephrosis 11/24/2011   Anemia 11/24/2011   BRADYCARDIA 02/05/2010   TINEA PEDIS 01/29/2010   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   PERIPHERAL EDEMA 01/29/2010   ABNORMAL ELECTROCARDIOGRAM 01/29/2010   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   Osteoarthrosis, generalized, 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   ONYCHOMYCOSIS, TOENAILS 10/02/2007   BPH (benign prostatic hyperplasia) 10/02/2007   Essential hypertension 03/06/2007    Orientation RESPIRATION BLADDER Height & Weight     Self  Normal Continent Weight: 260 lb 9.3 oz (118.2 kg) Height:  '6\' 1"'$  (185.4 cm)  BEHAVIORAL SYMPTOMS/MOOD NEUROLOGICAL BOWEL NUTRITION STATUS      Incontinent Diet (Regular)  AMBULATORY STATUS COMMUNICATION OF NEEDS Skin   Limited Assist Verbally  Normal                       Personal Care Assistance Level of Assistance  Bathing, Feeding, Dressing Bathing Assistance: Limited assistance Feeding assistance:  Independent Dressing Assistance: Limited assistance     Functional Limitations Info  Sight, Hearing, Speech Sight Info: Adequate Hearing Info: Impaired Speech Info: Adequate    SPECIAL CARE FACTORS FREQUENCY  PT (By licensed PT), OT (By licensed OT)     PT Frequency: 5x/wk OT Frequency: 5x/wk            Contractures Contractures Info: Not present    Additional Factors Info  Code Status, Allergies Code Status Info: FULL Allergies Info: Chicken Meat (Diagnostic), Chicken Protein, Pork-derived Products           Current Medications (01/18/2022):  This is the current hospital active medication list Current Facility-Administered Medications  Medication Dose Route Frequency Provider Last Rate Last Admin   acetaminophen (TYLENOL) tablet 650 mg  650 mg Oral Q6H PRN Aline August, MD   650 mg at 01/18/22 0413   bisacodyl (DULCOLAX) suppository 10 mg  10 mg Rectal Daily PRN Aline August, MD       Chlorhexidine Gluconate Cloth 2 % PADS 6 each  6 each Topical Daily Aline August, MD   6 each at 01/18/22 0929   clopidogrel (PLAVIX) tablet 75 mg  75 mg Oral Daily Tu, Ching T, DO   75 mg at 01/18/22 0929   enoxaparin (LOVENOX) injection 40 mg  40 mg Subcutaneous Q24H Tu, Ching T, DO   40 mg at 01/17/22 2122   feeding supplement (ENSURE ENLIVE / ENSURE PLUS) liquid 237 mL  237 mL Oral BID BM Alekh, Kshitiz, MD   237 mL at 01/18/22 0935   iohexol (OMNIPAQUE) 300 MG/ML solution 100 mL  100 mL Intravenous Once PRN Isla Pence, MD       morphine (PF) 2 MG/ML injection 2 mg  2 mg Intravenous Q2H PRN Aline August, MD   2 mg at 01/18/22 0139   polyethylene glycol (MIRALAX / GLYCOLAX) packet 17 g  17 g Oral Daily Starla Link, Kshitiz, MD   17 g at 01/18/22 0929   senna-docusate (Senokot-S) tablet 2 tablet  2 tablet Oral BID Aline August, MD   2 tablet at 01/18/22 0929   tamsulosin (FLOMAX) capsule 0.4 mg  0.4 mg Oral QPC supper Aline August, MD   0.4 mg at 01/17/22 1730   traMADol  (ULTRAM) tablet 50 mg  50 mg Oral Q12H PRN Aline August, MD   50 mg at 01/18/22 8563     Discharge Medications: Please see discharge summary for a list of discharge medications.  Relevant Imaging Results:  Relevant Lab Results:   Additional Information SSN: [id number]  Vassie Moselle, LCSW

## 2022-01-18 NOTE — Plan of Care (Signed)

## 2022-01-18 NOTE — TOC Progression Note (Signed)
Transition of Care [facility]) - Progression Note    Patient Details  Name: [name] MRN: [id number] Date of Birth: Dec 14, 1950  [facility] CM/SW [place], [place] Phone Number: 01/18/2022, 9:57 AM  Clinical Narrative:    CSW spoke with [place] to inform pt is medically stable to return to SNF. Insurance authorization has been requested and is currently pending approval.  CSW spoke with pt's son Griffin Basil [phone]) and updated him on discharge plans. Pt's son is agreeable to pt returning to SNF.    Expected Discharge Plan: Skilled Nursing Facility Barriers to Discharge: Continued Medical Work up  Expected Discharge Plan and Services Expected Discharge Plan: [place] In-house Referral: Clinical Social Work Discharge Planning Services: CM Consult Post Acute Care Choice: [place] Living arrangements for the past 2 months: [place]                 DME Arranged: N/A DME Agency: NA                   Social Determinants of Health (SDOH) Interventions    Readmission Risk Interventions    10/22/2019   12:04 PM  Readmission Risk Prevention Plan  Transportation Screening Complete  PCP or Specialist Appt within 3-5 Days Not Complete  Not Complete comments pending medical stability  HRI or Home Care Consult Complete  Social Work Consult for [place] Planning/Counseling Complete  Palliative Care Screening Not Applicable  Medication Review Press photographer) Referral to Pharmacy

## 2022-01-18 NOTE — Progress Notes (Addendum)
PROGRESS NOTE    [name]  DEY:[id number] DOB: 09-16-1950 DOA: 01/13/2022 PCP: Biagio Borg, MD   Brief Narrative:  71 y.o. 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  On presentation, EMS reported temperature of 102.  In the ED, he was tachycardic, tachypneic with mild leukocytosis and creatinine elevated to 2.82 from prior of 1.96.  CT of abdomen and pelvis showed bibasilar atelectasis with superimposed developing infection/inflammation not excluded with fresh transition state of the colon, no diverticulitis.  He was started on IV fluids and antibiotics.  Blood and urine cultures negative so far.  He required Foley catheter placement for urinary retention during the hospitalization.  Will need outpatient urology evaluation and follow-up.  PT recommended SNF placement.  Assessment & Plan:   Acute metabolic encephalopathy -Possibly from fever/sepsis.   -Improving.  Monitor mental status.  Still has expressive aphasia which is probably his baseline. -Fall precautions. -Diet as per SLP recommendations. -PT recommends SNF placement.  Possible sepsis: Present on admission; unclear etiology Questionable bacterial pneumonia Questionable infective diarrhea Possible UTI: Present on admission -Presented with fever, tachycardia, tachypnea and leukocytosis with possibility of sepsis from possible pneumonia versus infective diarrhea versus UTI.  UA showing pyuria. -Blood and urine cultures negative so far.  No significant diarrhea since admission: GI PCR and stool for C. difficile testing have not been done because of the same.  CT of abdomen/pelvis as above. -Cefepime was switched to to IV Rocephin.  Continue Rocephin for now.  Flagyl  discontinued on 01/15/2022.  Vancomycin discontinued on 01/14/2022. -No temperature spikes over the last 48 hours.  Leukocytosis--slight worsening of leukocytosis.  Repeat labs.  Normocytic anemia --From anemia of chronic disease.  Hemoglobin stable.  Monitor  Acute kidney injury on CKD stage IIIb -Creatinine 2.83 on presentation; was 1.96 on discharge last month -Treated with IV fluids.  Creatinine 2.03 this morning.  Repeat a.m. labs.  No hydronephrosis on CT of abdomen. -IV fluids have been discontinued.   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  Patient possibly has residual aphasia with aphasia and right-sided weakness from prior stroke. MRI of brain was negative for any stroke. -Continue Plavix.  Outpatient follow-up with neurology.  Chronic diastolic heart failure -Has slightly swollen lower extremities.  Strict input and output.  Daily weights.  We will give 1 dose of IV Lasix.  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  Patient required in and out cath few times and subsequently Foley catheter placed on 01/14/2022.  Flomax started on 01/14/2022.  Will need outpatient urology follow-up.  Essential hypertension -Monitor blood pressure.  Physical deconditioning -PT eval. patient will have to return back to SNF.  TOC following.  Hypokalemia -Replace.  Repeat a.m. labs.  Obesity -Outpatient follow-up  Chronic knee pain -Follows up with orthopedic/sports medicine as an outpatient for recurrent steroid injections.  Outpatient follow-up with orthopedics.  DVT prophylaxis: Lovenox Code Status: Full Family Communication: None at bedside Disposition  Plan: Status is: inpatient because: Of severity of illness.  Possible discharge to SNF  in 1 to 2 days if clinically improves, leukocytosis improves.  Consultants: None  Procedures: None  Antimicrobials:  Anti-infectives (From admission, onward)    Start     Dose/Rate Route Frequency Ordered Stop   01/16/22 1500  cefTRIAXone (ROCEPHIN) 2 g in sodium chloride 0.9 % 100 mL IVPB        2 g 200 mL/hr over 30 Minutes Intravenous Every 24 hours 01/16/22 1343 01/17/22 1635   01/15/22 1600  vancomycin (VANCOREADY) IVPB 1500 mg/300 mL  Status:  Discontinued        1,500 mg 150 mL/hr over 120 Minutes Intravenous Every 48 hours 01/13/22 2204 01/14/22 1430   01/14/22 0400  metroNIDAZOLE (FLAGYL) IVPB 500 mg  Status:  Discontinued        500 mg 100 mL/hr over 60 Minutes Intravenous Every 12 hours 01/13/22 2136 01/15/22 1444   01/14/22 0400  ceFEPIme (MAXIPIME) 2 g in sodium chloride 0.9 % 100 mL IVPB  Status:  Discontinued        2 g 200 mL/hr over 30 Minutes Intravenous Every 12 hours 01/13/22 2204 01/16/22 1343   01/13/22 1530  ceFEPIme (MAXIPIME) 2 g in sodium chloride 0.9 % 100 mL IVPB        2 g 200 mL/hr over 30 Minutes Intravenous  Once 01/13/22 1523 01/13/22 1617   01/13/22 1530  vancomycin (VANCOREADY) IVPB 2000 mg/400 mL        2,000 mg 200 mL/hr over 120 Minutes Intravenous  Once 01/13/22 1525 01/13/22 2016   01/13/22 1515  metroNIDAZOLE (FLAGYL) IVPB 500 mg        500 mg 100 mL/hr over 60 Minutes Intravenous  Once 01/13/22 1513 01/13/22 1722         Subjective: Patient seen and examined at bedside.  Poor historian.  Awake, tries to answer some questions but still has expressive aphasia.  No fever, vomiting or seizures reported.   Objective: Vitals:   01/17/22 1538 01/17/22 1928 01/18/22 0407 01/18/22 0412  BP: (!) 146/97 (!) 163/93 (!) 146/89   Pulse: (!) 110 (!) 105 (!) 104   Resp: '20 20 20   '$ Temp: 98.3 F (36.8 C) 99.9 F (37.7 C)  99.2 F (37.3 C)  TempSrc: Oral  Oral    SpO2: 95% 95% 94%   Weight:      Height:        Intake/Output Summary (Last 24 hours) at 01/18/2022 1115 Last data filed at 01/18/2022 0539 Gross per 24 hour  Intake 550 ml  Output 975 ml  Net -425 ml    Filed Weights   01/13/22 1523 01/16/22 0500 01/17/22 0400  Weight: 115 kg 118 kg 118.2 kg    Examination:  General: No acute distress.  On room air.  Looks chronically ill and deconditioned. ENT/neck: JVD is currently not elevated.  Right angle of mandible/upper neck soft mass palpable respiratory: Bilateral decreased sounds at bases with some basilar crackles.  CVS: Tachycardic; S1 and S2 heard abdominal: Soft, nontender, distended slightly; no organomegaly, bowel sounds heard  extremities: No clubbing; bilateral lower extremity edema present CNS: Alert; expressive aphasia present.  Sentences are incomprehensible.  Right-sided weakness still present.   Lymph: No obvious cervical lymphadenopathy palpable  skin: No obvious rashes/petechiae psych: No signs of agitation.  Flat affect.   Musculoskeletal: Mild bilateral knee tenderness and swelling present. GU: Indwelling foley catheter present.    Data Reviewed: I have personally reviewed following labs and imaging studies  CBC: Recent Labs  Lab 01/14/22 0814 01/15/22 0651 01/16/22 0525 01/17/22 0609 01/18/22 0456  WBC 13.6* 10.9* 10.5 13.9* 15.4*  NEUTROABS 10.9* 8.0* 7.4 10.1* 11.4*  HGB 11.5* 12.1* 12.5* 12.0* 12.1*  HCT 35.0* 36.6* 38.2* 36.7* 36.5*  MCV 88.6 87.8 89.7 87.8 88.8  PLT 242 275 325 380 429*    Basic Metabolic Panel: Recent Labs  Lab 01/14/22 0507 01/15/22 0651 01/16/22 0525 01/17/22 0609 01/18/22 0456  NA 139 142 144 143 143  K 3.5 3.1* 3.4* 3.5 3.3*  CL 106 109 111 111 110  CO2 '26 26 24 25 24  '$ GLUCOSE 106* 108* 112* 112* 96  BUN 31* 32* 27* 28* 34*  CREATININE 2.71* 2.13* 1.84* 1.96* 2.03*  CALCIUM 8.2* 8.4* 8.5* 8.7* 8.6*  MG 1.9 1.8 1.8 1.9 2.0    GFR: Estimated  Creatinine Clearance: 44.9 mL/min (A) (by C-G formula based on SCr of 2.03 mg/dL (H)). Liver Function Tests: Recent Labs  Lab 01/13/22 1512 01/14/22 0507 01/15/22 0651 01/17/22 0609  AST 33 '29 27 26  '$ ALT '21 16 19 19  '$ ALKPHOS 93 82 79 86  BILITOT 1.9* 1.0 0.9 0.8  PROT 6.9 5.8* 6.2* 6.2*  ALBUMIN 3.1* 2.4* 2.5* 2.3*    No results for input(s): "LIPASE", "AMYLASE" in the last 168 hours. Recent Labs  Lab 01/13/22 2133  AMMONIA <10    Coagulation Profile: Recent Labs  Lab 01/13/22 1512  INR 1.2    Cardiac Enzymes: No results for input(s): "CKTOTAL", "CKMB", "CKMBINDEX", "TROPONINI" in the last 168 hours. BNP (last 3 results) No results for input(s): "PROBNP" in the last 8760 hours. HbA1C: No results for input(s): "HGBA1C" in the last 72 hours. CBG: No results for input(s): "GLUCAP" in the last 168 hours. Lipid Profile: No results for input(s): "CHOL", "HDL", "LDLCALC", "TRIG", "CHOLHDL", "LDLDIRECT" in the last 72 hours. Thyroid Function Tests: No results for input(s): "TSH", "T4TOTAL", "FREET4", "T3FREE", "THYROIDAB" in the last 72 hours. Anemia Panel: No results for input(s): "VITAMINB12", "FOLATE", "FERRITIN", "TIBC", "IRON", "RETICCTPCT" in the last 72 hours. Sepsis Labs: Recent Labs  Lab 01/13/22 1512 01/13/22 2133 01/14/22 0032 01/14/22 0507 01/15/22 0651 01/16/22 0525  PROCALCITON  --   --   --  0.62 0.29 0.32  LATICACIDVEN 1.6 2.6* 0.9 0.9  --   --      Recent Results (from the past 240 hour(s))  Resp Panel by RT-PCR (Flu A&B, Covid) Anterior Nasal Swab     Status: None   Collection Time: 01/13/22  3:12 PM   Specimen: Anterior Nasal Swab  Result Value Ref Range Status   SARS Coronavirus 2 by RT PCR NEGATIVE NEGATIVE Final    Comment: (NOTE) SARS-CoV-2 target nucleic acids are NOT DETECTED.  The SARS-CoV-2 RNA is generally detectable in upper respiratory specimens during the acute phase of infection. The lowest concentration of SARS-CoV-2 viral  copies this assay can detect is 138 copies/mL. A negative result does not preclude SARS-Cov-2 infection and should not be used as the sole basis for treatment or other patient management decisions. A negative result may occur with  improper specimen collection/handling, submission of specimen other than nasopharyngeal swab, presence of viral mutation(s) within the areas targeted by this assay, and inadequate number of viral copies(<138 copies/mL). A negative result must be combined with clinical observations, patient history, and epidemiological information. The expected result is Negative.  Fact Sheet for Patients:  EntrepreneurPulse.com.au  Fact Sheet for Healthcare Providers:  IncredibleEmployment.be  This test is no t yet approved or cleared  by the [place] and  has been authorized for detection and/or diagnosis of SARS-CoV-2 by FDA under an Emergency Use Authorization (EUA). This EUA will remain  in effect (meaning this test can be used) for the duration of the COVID-19 declaration under Section 564(b)(1) of the Act, 21 U.S.C.section 360bbb-3(b)(1), unless the authorization is terminated  or revoked sooner.       Influenza A by PCR NEGATIVE NEGATIVE Final   Influenza B by PCR NEGATIVE NEGATIVE Final    Comment: (NOTE) The Xpert Xpress SARS-CoV-2/FLU/RSV plus assay is intended as an aid in the diagnosis of influenza from Nasopharyngeal swab specimens and should not be used as a sole basis for treatment. Nasal washings and aspirates are unacceptable for Xpert Xpress SARS-CoV-2/FLU/RSV testing.  Fact Sheet for Patients: EntrepreneurPulse.com.au  Fact Sheet for Healthcare Providers: IncredibleEmployment.be  This test is not yet approved or cleared by the [place] FDA and has been authorized for detection and/or diagnosis of SARS-CoV-2 by FDA under an Emergency Use Authorization (EUA). This  EUA will remain in effect (meaning this test can be used) for the duration of the COVID-19 declaration under Section 564(b)(1) of the Act, 21 U.S.C. section 360bbb-3(b)(1), unless the authorization is terminated or revoked.  Performed at [facility], [place] [address], [place], [place] [address]   Blood Culture (routine x 2)     Status: None   Collection Time: 01/13/22  3:12 PM   Specimen: BLOOD  Result Value Ref Range Status   Specimen Description   Final    BLOOD SITE NOT SPECIFIED Performed at [place] [address] [place], [place] [address]    Special Requests   Final    BOTTLES DRAWN AEROBIC AND ANAEROBIC Blood Culture adequate volume Performed at [place] [address], [place], [place] [address]    Culture   Final    NO GROWTH 5 DAYS Performed at [facility], [place] [address] [place], [place] [address]    Report Status 01/18/2022 FINAL  Final  Blood Culture (routine x 2)     Status: None   Collection Time: 01/13/22  3:40 PM   Specimen: BLOOD  Result Value Ref Range Status   Specimen Description   Final    BLOOD SITE NOT SPECIFIED Performed at [place] [address], [place], [place] [address]    Special Requests   Final    BOTTLES DRAWN AEROBIC AND ANAEROBIC Blood Culture results may not be optimal due to an inadequate volume of blood received in culture bottles Performed at [place] [address], [place], [place] [address]    Culture   Final    NO GROWTH 5 DAYS Performed at [facility], [place] [address], [place], [place] [address]    Report Status 01/18/2022 FINAL  Final  Urine Culture     Status: None   Collection Time: 01/13/22  6:45 PM   Specimen: In/Out Cath Urine  Result Value Ref Range Status   Specimen Description   Final    IN/OUT CATH URINE Performed at [place] [address] [place], [place]  [address]    Special Requests   Final    NONE Performed at [facility], [place] [address], [place], [place] [address]    Culture   Final    NO GROWTH Performed at [facility], [place] [address], [place],  [address]  Report Status 01/15/2022 FINAL  Final  MRSA Next Gen by PCR, Nasal     Status: None   Collection Time: 01/14/22 10:02 AM   Specimen: Nasal Mucosa; Nasal Swab  Result Value Ref Range Status   MRSA by PCR Next Gen NOT DETECTED NOT DETECTED Final    Comment: (NOTE) The GeneXpert MRSA Assay (FDA approved for NASAL specimens only), is one component of a comprehensive MRSA colonization surveillance program. It is not intended to diagnose MRSA infection nor to guide or monitor treatment for MRSA infections. Test performance is not FDA approved in patients less than 58 years old. Performed at [facility], [place] [address] [place], [place] [address]      Scheduled Meds:  Chlorhexidine Gluconate Cloth  6 each Topical Daily   clopidogrel  75 mg Oral Daily   enoxaparin (LOVENOX) injection  40 mg Subcutaneous Q24H   feeding supplement  237 mL Oral BID BM   polyethylene glycol  17 g Oral Daily   senna-docusate  2 tablet Oral BID   tamsulosin  0.4 mg Oral QPC supper   Continuous Infusions:          Aline August, MD [facility] 01/18/2022, 11:15 AM

## 2022-01-18 NOTE — Progress Notes (Signed)
Physical Therapy Treatment Patient Details Name: [name] MRN: [id number] DOB: 1950/11/13 Today's Date: 01/18/2022   History of Present Illness 71 y.o. 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  On presentation, EMS reported temperature of 102.  In the ED, he was tachycardic, tachypneic with mild leukocytosis and creatinine elevated. Dx of acute metabolic encephalopathy, possible sepsis (PNA vs infectious diarrhea vs UTI).    PT Comments    Pt is cooperative and attempts to assist self as much as able. He remains severely limited by bil LE pain R>L. Pt reports incr comfort on PT departure. Will continue to follow and progress as tolerated.    Recommendations for follow up therapy are one component of a multi-disciplinary discharge planning process, led by the attending physician.  Recommendations may be updated based on patient status, additional functional criteria and insurance authorization.  Follow Up Recommendations  Skilled nursing-short term rehab (<3 hours/day) Can patient physically be transported by private vehicle: No   Assistance Recommended at Discharge    Patient can return home with the following Two people to help with walking and/or transfers;Two people to help with bathing/dressing/bathroom;Assistance with cooking/housework;Direct supervision/assist for medications management;Assist for transportation;Help with stairs or ramp for entrance;Direct supervision/assist for financial management   Equipment Recommendations  None recommended by PT    Recommendations for Other Services       Precautions / Restrictions Precautions Precautions: Fall Precaution Comments: severe knee pain Restrictions Weight Bearing  Restrictions: No     Mobility  Bed Mobility Overal bed mobility: Needs Assistance Bed Mobility: Supine to Sit, Sit to Supine, Rolling Rolling: Min assist, Mod assist   Supine to sit: Max assist, +2 for physical assistance, +2 for safety/equipment, HOB elevated Sit to supine: Max assist, Total assist, +2 for physical assistance, +2 for safety/equipment   General bed mobility comments: incr time d/t pain. pt is able to self assist with LLE, with incr time able to self assist with trunk elevationhowever still required +2 max to complete transition. bed soaked with some bodily substance, gelationous material over pt's back (?? enema?) NT notified. PT and tech changed bed    Transfers                   General transfer comment: unable to perform 2* severe pain with attempted movement of BLEs    Ambulation/Gait                   Stairs             Wheelchair Mobility    Modified Rankin (Stroke Patients Only)       Balance                                            Cognition Arousal/Alertness: Awake/alert Behavior During Therapy: WFL for tasks assessed/performed Overall Cognitive Status: No family/caregiver present to determine baseline cognitive functioning                                 General Comments: able to communicate in [place], per RN speaks  speaks [place] and  4-5 other languages        Exercises      General Comments        Pertinent Vitals/Pain Pain Assessment Pain Assessment: Faces Faces Pain Scale: Hurts worst Pain Descriptors / Indicators: Grimacing, Guarding Pain Intervention(s): Limited activity within patient's tolerance, Monitored during session, Repositioned, Utilized relaxation techniques    Home Living                          Prior Function            PT Goals (current goals can now be found in the care plan section) Acute Rehab PT Goals PT Goal Formulation: With  patient Time For Goal Achievement: 01/29/22 Potential to Achieve Goals: Fair Progress towards PT goals: Progressing toward goals (slowly)    Frequency    Min 2X/week      PT Plan Current plan remains appropriate    Co-evaluation              AM-PAC PT "6 Clicks" Mobility   Outcome Measure  Help needed turning from your back to your side while in a flat bed without using bedrails?: Total Help needed moving from lying on your back to sitting on the side of a flat bed without using bedrails?: Total Help needed moving to and from a bed to a chair (including a wheelchair)?: Total Help needed standing up from a chair using your arms (e.g., wheelchair or bedside chair)?: Total Help needed to walk in hospital room?: Total Help needed climbing 3-5 steps with a railing? : Total 6 Click Score: 6    End of Session   Activity Tolerance: Patient limited by pain Patient left: in bed;with bed alarm set;with call bell/phone within reach Nurse Communication: Mobility status;Other (comment) (bed change) PT Visit Diagnosis: Difficulty in walking, not elsewhere classified (R26.2);Pain Pain - Right/Left: Right (bil R>L) Pain - part of body: Leg     Time: 8325-4982 PT Time Calculation (min) (ACUTE ONLY): 21 min  Charges:  $Therapeutic Activity: 8-22 mins                     Baxter Flattery, PT  Acute Rehab Dept [facility]) [phone]  [facility] Weekend Pager [facility] only)  [phone]  01/18/2022    [facility] 01/18/2022, 2:05 PM

## 2022-01-19 ENCOUNTER — Inpatient Hospital Stay (HOSPITAL_COMMUNITY): Payer: Medicare Other

## 2022-01-19 ENCOUNTER — Encounter (HOSPITAL_COMMUNITY): Payer: Self-pay | Admitting: Internal Medicine

## 2022-01-19 DIAGNOSIS — I471 Supraventricular tachycardia: Secondary | ICD-10-CM

## 2022-01-19 DIAGNOSIS — G9341 Metabolic encephalopathy: Secondary | ICD-10-CM | POA: Diagnosis not present

## 2022-01-19 DIAGNOSIS — Z8673 Personal history of transient ischemic attack (TIA), and cerebral infarction without residual deficits: Secondary | ICD-10-CM | POA: Diagnosis not present

## 2022-01-19 DIAGNOSIS — I5033 Acute on chronic diastolic (congestive) heart failure: Secondary | ICD-10-CM | POA: Diagnosis not present

## 2022-01-19 DIAGNOSIS — I77810 Thoracic aortic ectasia: Secondary | ICD-10-CM | POA: Diagnosis not present

## 2022-01-19 DIAGNOSIS — R338 Other retention of urine: Secondary | ICD-10-CM | POA: Diagnosis not present

## 2022-01-19 DIAGNOSIS — N179 Acute kidney failure, unspecified: Secondary | ICD-10-CM | POA: Diagnosis not present

## 2022-01-19 LAB — CBC WITH DIFFERENTIAL/PLATELET
Abs Immature Granulocytes: 0.29 10*3/uL — ABNORMAL HIGH (ref 0.00–0.07)
Basophils Absolute: 0.1 10*3/uL (ref 0.0–0.1)
Basophils Relative: 1 %
Eosinophils Absolute: 0.1 10*3/uL (ref 0.0–0.5)
Eosinophils Relative: 1 %
HCT: 35.1 % — ABNORMAL LOW (ref 39.0–52.0)
Hemoglobin: 11.5 g/dL — ABNORMAL LOW (ref 13.0–17.0)
Immature Granulocytes: 2 %
Lymphocytes Relative: 15 %
Lymphs Abs: 2 10*3/uL (ref 0.7–4.0)
MCH: 28.9 pg (ref 26.0–34.0)
MCHC: 32.8 g/dL (ref 30.0–36.0)
MCV: 88.2 fL (ref 80.0–100.0)
Monocytes Absolute: 1 10*3/uL (ref 0.1–1.0)
Monocytes Relative: 8 %
Neutro Abs: 9.6 10*3/uL — ABNORMAL HIGH (ref 1.7–7.7)
Neutrophils Relative %: 73 %
Platelets: 506 10*3/uL — ABNORMAL HIGH (ref 150–400)
RBC: 3.98 MIL/uL — ABNORMAL LOW (ref 4.22–5.81)
RDW: 15.9 % — ABNORMAL HIGH (ref 11.5–15.5)
WBC: 13.2 10*3/uL — ABNORMAL HIGH (ref 4.0–10.5)
nRBC: 0.2 % (ref 0.0–0.2)

## 2022-01-19 LAB — MAGNESIUM: Magnesium: 1.9 mg/dL (ref 1.7–2.4)

## 2022-01-19 LAB — BASIC METABOLIC PANEL
Anion gap: 8 (ref 5–15)
BUN: 43 mg/dL — ABNORMAL HIGH (ref 8–23)
CO2: 25 mmol/L (ref 22–32)
Calcium: 8.7 mg/dL — ABNORMAL LOW (ref 8.9–10.3)
Chloride: 110 mmol/L (ref 98–111)
Creatinine, Ser: 2.17 mg/dL — ABNORMAL HIGH (ref 0.61–1.24)
GFR, Estimated: 32 mL/min — ABNORMAL LOW (ref >60)
Glucose, Bld: 103 mg/dL — ABNORMAL HIGH (ref 70–99)
Potassium: 3.2 mmol/L — ABNORMAL LOW (ref 3.5–5.1)
Sodium: 143 mmol/L (ref 135–145)

## 2022-01-19 MED ORDER — POTASSIUM CHLORIDE CRYS ER 20 MEQ PO TBCR
40.0000 meq | EXTENDED_RELEASE_TABLET | ORAL | Status: AC
  Administered 2022-01-19 (×2): 40 meq via ORAL
  Filled 2022-01-19 (×2): qty 2

## 2022-01-19 MED ORDER — SENNOSIDES-DOCUSATE SODIUM 8.6-50 MG PO TABS: 2.0000 | ORAL_TABLET | Freq: Two times a day (BID) | ORAL | 0 refills | Status: DC

## 2022-01-19 MED ORDER — POLYETHYLENE GLYCOL 3350 17 G PO PACK: 17.0000 g | PACK | Freq: Every day | ORAL | 0 refills | Status: DC

## 2022-01-19 MED ORDER — METOPROLOL TARTRATE 25 MG PO TABS
25.0000 mg | ORAL_TABLET | Freq: Two times a day (BID) | ORAL | Status: DC
  Administered 2022-01-19 – 2022-01-27 (×17): 25 mg via ORAL
  Filled 2022-01-19 (×18): qty 1

## 2022-01-19 MED ORDER — TRAMADOL HCL 50 MG PO TABS: 50.0000 mg | ORAL_TABLET | Freq: Two times a day (BID) | ORAL | 0 refills | Status: AC | PRN

## 2022-01-19 MED ORDER — ALLOPURINOL 100 MG PO TABS: 100.0000 mg | ORAL_TABLET | Freq: Every day | ORAL | Status: AC

## 2022-01-19 MED ORDER — FUROSEMIDE 10 MG/ML IJ SOLN
60.0000 mg | Freq: Once | INTRAMUSCULAR | Status: AC
Start: 2022-01-19 — End: 2022-01-19
  Administered 2022-01-19: 60 mg via INTRAVENOUS
  Filled 2022-01-19: qty 6

## 2022-01-19 NOTE — Progress Notes (Signed)
   01/19/22 0244  Assess: MEWS Score  Temp 99.2 F (37.3 C)  BP (!) 150/89  MAP (mmHg) 105  Pulse Rate (!) 104  Resp (!) 22  SpO2 94 %  O2 Device Room Air  Assess: MEWS Score  MEWS Temp 0  MEWS Systolic 0  MEWS Pulse 1  MEWS RR 1  MEWS LOC 0  MEWS Score 2  MEWS Score Color Yellow  Assess: if the MEWS score is Yellow or Red  Were vital signs taken at a resting state? Yes  Focused Assessment No change from prior assessment  Does the patient meet 2 or more of the SIRS criteria? No  MEWS guidelines implemented *See Row Information* Yes  Treat  MEWS Interventions Administered scheduled meds/treatments  Notify: Charge Nurse/RN  Name of Charge Nurse/RN Notified Verdis Frederickson RN  Date Charge Nurse/RN Notified 01/19/22  Time Charge Nurse/RN Notified 0246  Assess: SIRS CRITERIA  SIRS Temperature  0  SIRS Pulse 1  SIRS Respirations  1  SIRS WBC 1  SIRS Score Sum  3

## 2022-01-19 NOTE — Progress Notes (Signed)
PROGRESS NOTE    [name]  FBP:[id number] DOB: January 17, 1951 DOA: 01/13/2022 PCP: Biagio Borg, MD   Brief Narrative:  71 y.o. 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  On presentation, EMS reported temperature of 102.  In the ED, he was tachycardic, tachypneic with mild leukocytosis and creatinine elevated to 2.82 from prior of 1.96.  CT of abdomen and pelvis showed bibasilar atelectasis with superimposed developing infection/inflammation not excluded with fresh transition state of the colon, no diverticulitis.  He was started on IV fluids and antibiotics.  Blood and urine cultures negative so far.  He required Foley catheter placement for urinary retention during the hospitalization.  Will need outpatient urology evaluation and follow-up.  PT recommended SNF placement.   Assessment & Plan:   Acute metabolic encephalopathy -Possibly from fever/sepsis.   -Improving.  Monitor mental status.  Still has expressive aphasia which is probably his baseline. -Fall precautions. -Diet as per SLP recommendations. -PT recommends SNF placement.  Possible sepsis: Present on admission; unclear etiology Questionable bacterial pneumonia Questionable infective diarrhea Possible UTI: Present on admission -Presented with fever, tachycardia, tachypnea and leukocytosis with possibility of sepsis from possible pneumonia versus infective diarrhea versus UTI.  UA showing pyuria. -Blood and urine cultures negative so far.  No significant diarrhea since admission: GI PCR and stool for C. difficile testing have not been done because of the same.  CT of abdomen/pelvis as above. -Cefepime was switched to to IV Rocephin.  Today is day #7 of IV antibiotics.  DC  antibiotics today and monitor.  Flagyl discontinued on 01/15/2022.  Vancomycin discontinued on 01/14/2022. -No temperature spikes over the last few days.  Leukocytosis--improving.  Repeat labs.  Normocytic anemia --From anemia of chronic disease.  Hemoglobin stable.  Monitor  Acute kidney injury on CKD stage IIIb -Creatinine 2.83 on presentation; was 1.96 on discharge last month -Treated with IV fluids.  Creatinine 2.17 this morning.  Repeat a.m. labs.  No hydronephrosis on CT of abdomen. -IV fluids have been discontinued.  Patient received a dose of IV Lasix on 01/18/2022 for possible volume overload.  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  Patient possibly has residual aphasia with aphasia and right-sided weakness from prior stroke. MRI of brain was negative for any stroke. -Continue Plavix.  Outpatient follow-up with neurology.  SVT -Patient had an episode of SVT with heart rate going up to 170 on 01/18/2022.  Cardiology has been consulted: Has been started on metoprolol.  Follow recommendations.  Chronic diastolic heart failure -Has slightly swollen lower extremities.  Strict input and output.  Daily weights.  Patient received 1 dose of IV Lasix on 01/18/2021.  Further diuretics as per cardiology if needed.  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  Patient required in and out cath few times and subsequently Foley catheter placed on 01/14/2022.  Flomax started on 01/14/2022.  Will need outpatient urology follow-up.  Essential hypertension -Monitor blood pressure.  Physical deconditioning -patient will have to return back to SNF.  TOC  following.  Hypokalemia -  Replace.  Repeat a.m. labs.  Obesity -Outpatient follow-up  Chronic knee pain -Follows up with orthopedic/sports medicine as an outpatient for recurrent steroid injections.  Outpatient follow-up with orthopedics.  DVT prophylaxis: Lovenox Code Status: Full Family Communication: None at bedside Disposition Plan: Status is: inpatient because: Of severity of illness.  Possible discharge to SNF tomorrow if remains stable and if cleared by cardiology.  Consultants: None  Procedures: None  Antimicrobials:  Anti-infectives (From admission, onward)    Start     Dose/Rate Route Frequency Ordered Stop   01/18/22 1130  cefTRIAXone (ROCEPHIN) 2 g in sodium chloride 0.9 % 100 mL IVPB        2 g 200 mL/hr over 30 Minutes Intravenous Daily 01/18/22 1122     01/16/22 1500  cefTRIAXone (ROCEPHIN) 2 g in sodium chloride 0.9 % 100 mL IVPB        2 g 200 mL/hr over 30 Minutes Intravenous Every 24 hours 01/16/22 1343 01/17/22 1635   01/15/22 1600  vancomycin (VANCOREADY) IVPB 1500 mg/300 mL  Status:  Discontinued        1,500 mg 150 mL/hr over 120 Minutes Intravenous Every 48 hours 01/13/22 2204 01/14/22 1430   01/14/22 0400  metroNIDAZOLE (FLAGYL) IVPB 500 mg  Status:  Discontinued        500 mg 100 mL/hr over 60 Minutes Intravenous Every 12 hours 01/13/22 2136 01/15/22 1444   01/14/22 0400  ceFEPIme (MAXIPIME) 2 g in sodium chloride 0.9 % 100 mL IVPB  Status:  Discontinued        2 g 200 mL/hr over 30 Minutes Intravenous Every 12 hours 01/13/22 2204 01/16/22 1343   01/13/22 1530  ceFEPIme (MAXIPIME) 2 g in sodium chloride 0.9 % 100 mL IVPB        2 g 200 mL/hr over 30 Minutes Intravenous  Once 01/13/22 1523 01/13/22 1617   01/13/22 1530  vancomycin (VANCOREADY) IVPB 2000 mg/400 mL        2,000 mg 200 mL/hr over 120 Minutes Intravenous  Once 01/13/22 1525 01/13/22 2016   01/13/22 1515  metroNIDAZOLE (FLAGYL) IVPB 500 mg        500 mg 100 mL/hr over 60 Minutes  Intravenous  Once 01/13/22 1513 01/13/22 1722         Subjective: Patient seen and examined at bedside.  Poor historian.  Awake, tries to answer some questions but still has expressive aphasia.  No agitation, vomiting, seizures or fever reported.   Objective: Vitals:   01/18/22 2120 01/19/22 0244 01/19/22 0444 01/19/22 0600  BP:  (!) 150/89 123/81   Pulse:  (!) 104 95   Resp:  (!) 22 (!) 21   Temp: 98 F (36.7 C) 99.2 F (37.3 C) (!) 97.5 F (36.4 C)   TempSrc:  Oral Oral   SpO2:  94%    Weight:    108.5 kg  Height:        Intake/Output Summary (Last 24 hours) at 01/19/2022 1245 Last data filed at 01/19/2022 1048 Gross per 24 hour  Intake 475.6 ml  Output 2100 ml  Net -1624.4 ml    Filed Weights   01/16/22 0500 01/17/22 0400 01/19/22 0600  Weight: 118 kg 118.2 kg 108.5 kg    Examination:  General: Still on room air.  No distress.  Looks chronically ill and deconditioned. ENT/neck: No JVD elevation noted currently.  Right angle of mandible/upper neck soft mass palpable respiratory: Decreased breath sounds at bases bilaterally with intermittent tachypnea and  some scattered crackles  CVS: S1-S2 heard; mild intermittent tachycardia present abdominal: Soft, nontender, still distended, no organomegaly, normal bowel sounds heard  extremities: Lower extremity edema present; no cyanosis  CNS: Awake; expressive aphasia present.  Sentences are incomprehensible.  Right-sided weakness still present.   Lymph: No palpable lymphadenopathy skin: No obvious ecchymosis/lesions  psych: Affect is flat.  Currently showing no signs of agitation.  Musculoskeletal: Mild bilateral knee tenderness and swelling present. GU: Indwelling foley catheter present.    Data Reviewed: I have personally reviewed following labs and imaging studies  CBC: Recent Labs  Lab 01/15/22 0651 01/16/22 0525 01/17/22 0609 01/18/22 0456 01/19/22 0657  WBC 10.9* 10.5 13.9* 15.4* 13.2*  NEUTROABS 8.0* 7.4  10.1* 11.4* 9.6*  HGB 12.1* 12.5* 12.0* 12.1* 11.5*  HCT 36.6* 38.2* 36.7* 36.5* 35.1*  MCV 87.8 89.7 87.8 88.8 88.2  PLT 275 325 380 429* 506*    Basic Metabolic Panel: Recent Labs  Lab 01/15/22 0651 01/16/22 0525 01/17/22 0609 01/18/22 0456 01/19/22 0657  NA 142 144 143 143 143  K 3.1* 3.4* 3.5 3.3* 3.2*  CL 109 111 111 110 110  CO2 '26 24 25 24 25  '$ GLUCOSE 108* 112* 112* 96 103*  BUN 32* 27* 28* 34* 43*  CREATININE 2.13* 1.84* 1.96* 2.03* 2.17*  CALCIUM 8.4* 8.5* 8.7* 8.6* 8.7*  MG 1.8 1.8 1.9 2.0 1.9    GFR: Estimated Creatinine Clearance: 40.3 mL/min (A) (by C-G formula based on SCr of 2.17 mg/dL (H)). Liver Function Tests: Recent Labs  Lab 01/13/22 1512 01/14/22 0507 01/15/22 0651 01/17/22 0609  AST 33 '29 27 26  '$ ALT '21 16 19 19  '$ ALKPHOS 93 82 79 86  BILITOT 1.9* 1.0 0.9 0.8  PROT 6.9 5.8* 6.2* 6.2*  ALBUMIN 3.1* 2.4* 2.5* 2.3*    No results for input(s): "LIPASE", "AMYLASE" in the last 168 hours. Recent Labs  Lab 01/13/22 2133  AMMONIA <10    Coagulation Profile: Recent Labs  Lab 01/13/22 1512  INR 1.2    Cardiac Enzymes: No results for input(s): "CKTOTAL", "CKMB", "CKMBINDEX", "TROPONINI" in the last 168 hours. BNP (last 3 results) No results for input(s): "PROBNP" in the last 8760 hours. HbA1C: No results for input(s): "HGBA1C" in the last 72 hours. CBG: No results for input(s): "GLUCAP" in the last 168 hours. Lipid Profile: No results for input(s): "CHOL", "HDL", "LDLCALC", "TRIG", "CHOLHDL", "LDLDIRECT" in the last 72 hours. Thyroid Function Tests: No results for input(s): "TSH", "T4TOTAL", "FREET4", "T3FREE", "THYROIDAB" in the last 72 hours. Anemia Panel: No results for input(s): "VITAMINB12", "FOLATE", "FERRITIN", "TIBC", "IRON", "RETICCTPCT" in the last 72 hours. Sepsis Labs: Recent Labs  Lab 01/13/22 1512 01/13/22 2133 01/14/22 0032 01/14/22 0507 01/15/22 0651 01/16/22 0525  PROCALCITON  --   --   --  0.62 0.29 0.32   LATICACIDVEN 1.6 2.6* 0.9 0.9  --   --      Recent Results (from the past 240 hour(s))  Resp Panel by RT-PCR (Flu A&B, Covid) Anterior Nasal Swab     Status: None   Collection Time: 01/13/22  3:12 PM   Specimen: Anterior Nasal Swab  Result Value Ref Range Status   SARS Coronavirus 2 by RT PCR NEGATIVE NEGATIVE Final    Comment: (NOTE) SARS-CoV-2 target nucleic acids are NOT DETECTED.  The SARS-CoV-2 RNA is generally detectable in upper respiratory specimens during the acute phase of infection. The lowest concentration of SARS-CoV-2 viral copies this assay can detect is 138 copies/mL. A negative result  does not preclude SARS-Cov-2 infection and should not be used as the sole basis for treatment or other patient management decisions. A negative result may occur with  improper specimen collection/handling, submission of specimen other than nasopharyngeal swab, presence of viral mutation(s) within the areas targeted by this assay, and inadequate number of viral copies(<138 copies/mL). A negative result must be combined with clinical observations, patient history, and epidemiological information. The expected result is Negative.  Fact Sheet for Patients:  EntrepreneurPulse.com.au  Fact Sheet for Healthcare Providers:  IncredibleEmployment.be  This test is no t yet approved or cleared by the [place] FDA and  has been authorized for detection and/or diagnosis of SARS-CoV-2 by FDA under an Emergency Use Authorization (EUA). This EUA will remain  in effect (meaning this test can be used) for the duration of the COVID-19 declaration under Section 564(b)(1) of the Act, 21 U.S.C.section 360bbb-3(b)(1), unless the authorization is terminated  or revoked sooner.       Influenza A by PCR NEGATIVE NEGATIVE Final   Influenza B by PCR NEGATIVE NEGATIVE Final    Comment: (NOTE) The Xpert Xpress SARS-CoV-2/FLU/RSV plus assay is intended as an  aid in the diagnosis of influenza from Nasopharyngeal swab specimens and should not be used as a sole basis for treatment. Nasal washings and aspirates are unacceptable for Xpert Xpress SARS-CoV-2/FLU/RSV testing.  Fact Sheet for Patients: EntrepreneurPulse.com.au  Fact Sheet for Healthcare Providers: IncredibleEmployment.be  This test is not yet approved or cleared by the [place] FDA and has been authorized for detection and/or diagnosis of SARS-CoV-2 by FDA under an Emergency Use Authorization (EUA). This EUA will remain in effect (meaning this test can be used) for the duration of the COVID-19 declaration under Section 564(b)(1) of the Act, 21 U.S.C. section 360bbb-3(b)(1), unless the authorization is terminated or revoked.  Performed at [facility], [place] [address], [place], [place] [address]   Blood Culture (routine x 2)     Status: None   Collection Time: 01/13/22  3:12 PM   Specimen: BLOOD  Result Value Ref Range Status   Specimen Description   Final    BLOOD SITE NOT SPECIFIED Performed at [place] [address] [place], [place] [address]    Special Requests   Final    BOTTLES DRAWN AEROBIC AND ANAEROBIC Blood Culture adequate volume Performed at [facility] [address] [place], [place] [address]    Culture   Final    NO GROWTH 5 DAYS Performed at [facility], [place] [address], [place], [place] [address]    Report Status 01/18/2022 FINAL  Final  Blood Culture (routine x 2)     Status: None   Collection Time: 01/13/22  3:40 PM   Specimen: BLOOD  Result Value Ref Range Status   Specimen Description   Final    BLOOD SITE NOT SPECIFIED Performed at [facility] [place] [address], [place], [place] [address]    Special Requests   Final    BOTTLES DRAWN AEROBIC AND ANAEROBIC Blood Culture results may not be optimal due to an inadequate  volume of blood received in culture bottles Performed at [facility] [address] [place], [place] [address]    Culture   Final    NO GROWTH 5 DAYS Performed at [facility], [place] [address], [place],  [address]    Report Status 01/18/2022 FINAL  Final  Urine Culture  Status: None   Collection Time: 01/13/22  6:45 PM   Specimen: In/Out Cath Urine  Result Value Ref Range Status   Specimen Description   Final    IN/OUT CATH URINE Performed at [facility], [place] [address] [place], [place] [address]    Special Requests   Final    NONE Performed at [facility], [place] [address], [place], [place] [address]    Culture   Final    NO GROWTH Performed at [facility], [place] [address], [place], [place] [address]    Report Status 01/15/2022 FINAL  Final  MRSA Next Gen by PCR, Nasal     Status: None   Collection Time: 01/14/22 10:02 AM   Specimen: Nasal Mucosa; Nasal Swab  Result Value Ref Range Status   MRSA by PCR Next Gen NOT DETECTED NOT DETECTED Final    Comment: (NOTE) The GeneXpert MRSA Assay (FDA approved for NASAL specimens only), is one component of a comprehensive MRSA colonization surveillance program. It is not intended to diagnose MRSA infection nor to guide or monitor treatment for MRSA infections. Test performance is not FDA approved in patients less than 88 years old. Performed at [facility], [place] [address], [place],  [address]      Scheduled Meds:  Chlorhexidine Gluconate Cloth  6 each Topical Daily   clopidogrel  75 mg Oral Daily   enoxaparin (LOVENOX) injection  40 mg Subcutaneous Q24H   feeding supplement  237 mL Oral BID BM   metoprolol tartrate  25 mg Oral BID   polyethylene glycol  17 g Oral Daily   potassium chloride  40 mEq Oral Q4H   senna-docusate  2 tablet Oral BID   tamsulosin  0.4 mg Oral QPC supper   Continuous Infusions:  sodium  chloride 10 mL/hr at 01/18/22 1506   cefTRIAXone (ROCEPHIN)  IV 2 g (01/19/22 0815)           Aline August, MD [facility] 01/19/2022, 12:45 PM

## 2022-01-19 NOTE — Consult Note (Signed)
   [facility] CM Inpatient Consult   01/19/2022  [name] 1950-11-10 [id number]  [facility] [[facility]] Patient: [name] Liaison remote coverage review for high risk   Primary Care Provider:  Biagio Borg, MD, [place] at [facility] , is an embedded provider with a Chronic Care Management team and program, and is listed for the transition of care follow up and appointments.  Patient was screened for Embedded practice service needs for care management for readmission prevention with high risk score.  Patient is being evaluated for the return of a skilled nursing facility level of care recently from [facility]. Currently, inpatient TOC notes and [facility] PAC RNCM for patient to eventually for LTC, Medicaid in process noted. Reviewed inpatient [facility] team notes for barriers to care and transition.  Plan: Patient's current TOC needs for post hospital needs.are to be met at a skilled nursing facility level of care. If patient returns to a [facility] affiliated SNF will request [facility] RN PAC to continue to follow.  Please contact for further questions,  Natividad Brood, RN BSN [facility] Liaison  [phone] business mobile phone Toll free office [phone]  Fax number: [phone] [email or web address]

## 2022-01-19 NOTE — Consult Note (Addendum)
Cardiology Consultation:   Patient ID: [name] MRN: [id number]; DOB: [birth year]  Admit date: 01/13/2022 Date of Consult: 01/19/2022  PCP:  Biagio Borg, MD   [facility] Providers Cardiologist:  Mertie Moores, MD        Patient Profile:   [name] is a 71 y.o. male originally from [place] with a hx of prostate cancer, AI,  chronic HFpEF,  CKD, HTN, RBBB and CVA who is being seen 01/19/2022 for the evaluation of CHF at the request of Dr. Starla Link.  History of Present Illness:   [name] with above hx and recent Echo in July of normal EF 55-60%, mildly elevated pulmonary pressure at 41 mmHg, moderate AI and mild aortic dilatation at 40 mm.  Plan for cardiac MRI to quantitate aortic insuff.  No cardiac cath but nuc study in 2020 with small defect of severe severity in apex location.  Considered intermediate risk due to EF 38% but echo with normal EF.   Recently hospitalized (12/08/21-12/12/21) for acute CVA,  MRI which showed M2 branch occlusion with infarct.  Neurology recommended 30 day event monitor with high suspicion for embolic etiology.  Placed on Plavix.   No hx of a fib and none noted with the July hospitalization.   I do not see that monitor was placed.  He had gout flare up with hospitalization as well.    Pt was admitted 01/13/22 with altered mental status.  He was sent to [facility] from [facility] for fever, abd distension and fever to 102F.  K+ was 2.9 and replaced.  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 Also noted atrophic Rt kidney, aortic atherosclerosis.   +leukocytosis, possible PNA.      Since that time pt had slowly improved, and expressive aphasia continues and is baseline.  He did have UTI on admit.   Neg blood and urine cultures. Treated with ABX now on IV Rocephin. + AKI treated with IV fluids and now improved.    Labs today Na 143, K+ 3.2 BUN 43 Cr 2.17 Mg+ 1.8  CRP 34.9  WBC 13.2 hgb  11.5  plts 506  PCXR 01/13/22 IMPRESSION: Poor inspiration with mild bibasilar atelectasis, decreased on the left and new on the right. No evidence of pneumonia.  MRI of Head 01/14/22 IMPRESSION: Resolving infarct in the left parietal lobe. No new acute infarct compared with the recent MRI.  Moderate chronic microvascular ischemia. Numerous chronic microhemorrhage foci in the brain. Correlate with hypertension history.  Dolichoectasia circle-of-Willis  EKG:  The EKG was personally reviewed and demonstrates:  ST 110 with RBBB, LAFB and freq PACs on admit similar to June and July EKG but faster rate.  Telemetry:  Telemetry was personally reviewed and demonstrates:   SR with 8 beats of SVT yesterday.  Another episode of 13 beats rapid at 190.    Yesterday pt with lower ext edema and rec'd 1 dose of IV lasix 60 mg.  Neg 2353 yesterday.       BP today 150/89 to 123/81 P upper 90s to low 100s afebrile R 16-22 Difficult to understand pt but he was glad to see Dr. Elmarie Shiley staff   Past Medical History:  Diagnosis Date   Anemia    normal Fe, nl B12, nl retic, nl EPO July '13   Aortic regurgitation    Moderate AI 04/17/19 echo   Blood transfusion without reported diagnosis    BPH (benign  prostatic hyperplasia)    Chronic back pain    Chronic kidney disease    CKD III, obstructive nephropathy   Diverticulosis    Dysuria    Elevated PSA, greater than or equal to 20 ng/ml June '13   PSA 107   Hemorrhoids, internal, with bleeding, prolapse 09/19/2014   Hyperlipidemia 05/29/2014   Hypertension    Obstructive uropathy 11/27/2015   Pre-diabetes    per patient "reduced sugar intake"   Renal insufficiency    Tuberculosis    h/o PPD +   UTI (urinary tract infection)    Vitamin D deficiency 09/13/2017    Past Surgical History:  Procedure Laterality Date   CARPAL TUNNEL RELEASE Right    COLONOSCOPY     HEMORRHOID BANDING     INSERTION OF MESH N/A 10/17/2019   Procedure: Insertion Of Mesh;   Surgeon: Ralene Ok, MD;  Location: [place];  Service: General;  Laterality: N/A;   SPLENECTOMY     XI ROBOTIC ASSISTED SIMPLE PROSTATECTOMY N/A 03/09/2019   Procedure: XI ROBOTIC ASSISTED SIMPLE PROSTATECTOMY;  Surgeon: Cleon Gustin, MD;  Location: [facility];  Service: Urology;  Laterality: N/A;   XI ROBOTIC ASSISTED VENTRAL HERNIA N/A 10/17/2019   Procedure: XI ROBOTIC ASSISTED INCISIONAL HERNIA REPAIR WITH MESH;  Surgeon: Ralene Ok, MD;  Location: [place];  Service: General;  Laterality: N/A;     Home Medications:  Prior to Admission medications   Medication Sig Start Date End Date Taking? Authorizing Provider  acetaminophen (TYLENOL) 500 MG tablet Take 1,000 mg by mouth every 6 (six) hours as needed (Reason not listed on MAR).   Yes [provider]  allopurinol (ZYLOPRIM) 100 MG tablet Take 2 tablets (200 mg total) by mouth daily. Patient taking differently: Take 100 mg by mouth daily. 12/10/20  Yes Biagio Borg, MD  amLODipine (NORVASC) 10 MG tablet Take 1 tablet (10 mg total) by mouth daily. 12/10/20  Yes Biagio Borg, MD  atorvastatin (LIPITOR) 80 MG tablet Take 1 tablet (80 mg total) by mouth daily. Patient taking differently: Take 80 mg by mouth at bedtime. 12/12/21  Yes Patrecia Pour, MD  cholecalciferol (VITAMIN D3) 25 MCG (1000 UNIT) tablet Take 1,000 Units by mouth daily.   Yes [provider]  clopidogrel (PLAVIX) 75 MG tablet Take 1 tablet (75 mg total) by mouth daily. 12/13/21  Yes Patrecia Pour, MD  hydrALAZINE (APRESOLINE) 10 MG tablet Take 10 mg by mouth 2 (two) times daily. 01/08/22  Yes [provider]  lactulose (CHRONULAC) 10 GM/15ML solution TAKE 15 TO 30 MLS BY MOUTH ONCE DAILY Patient taking differently: Take 10-20 g by mouth daily. 07/31/21  Yes Gatha Mayer, MD  metoprolol tartrate (LOPRESSOR) 50 MG tablet Take 1 tablet by mouth twice daily Patient taking differently: Take 50 mg by mouth 2 (two) times daily. 08/04/21  Yes Biagio Borg, MD  Multiple Vitamin (MULTIVITAMIN WITH MINERALS) TABS tablet Take 1 tablet by mouth daily.   Yes [provider]  polyvinyl alcohol (ARTIFICIAL TEARS) 1.4 % ophthalmic solution Place 2 drops into both eyes as needed (Reason not listed on MAR).   Yes [provider]  potassium chloride (KLOR-CON) 10 MEQ tablet Take 10 mEq by mouth See admin instructions. Take 10 mEq once daily on Monday, Wednesday, and Friday 01/07/22  Yes [provider]  predniSONE (DELTASONE) 20 MG tablet Take 2 tablets (40 mg total) by mouth daily with breakfast. Patient taking differently: Take 20  mg by mouth daily. 01/11/22-01/17/22 12/13/21  Yes Patrecia Pour, MD  tamsulosin (FLOMAX) 0.4 MG CAPS capsule Take 0.4 mg by mouth daily. 01/08/22  Yes [provider]  Allopurinol 200 MG TABS Take 200 mg by mouth daily. Patient not taking: Reported on 01/14/2022    [provider]  aspirin EC 81 MG tablet Take 81 mg by mouth daily. For 21 days Patient not taking: Reported on 01/14/2022    [provider]  bisacodyl (DULCOLAX) 10 MG suppository Place 10 mg rectally once. Patient not taking: Reported on 01/14/2022    [provider]  colchicine 0.6 MG tablet Take 1 tablet (0.6 mg total) by mouth daily. Patient not taking: Reported on 01/14/2022 12/10/20   Biagio Borg, MD  hydrALAZINE (APRESOLINE) 25 MG tablet Take 25 mg by mouth once. Patient not taking: Reported on 01/14/2022    [provider]  hydroxypropyl methylcellulose / hypromellose (ISOPTO TEARS / GONIOVISC) 2.5 % ophthalmic solution Place 1-2 drops into both eyes 3 (three) times daily as needed (dry/irritated eyes.). Patient not taking: Reported on 01/14/2022 11/12/19   Biagio Borg, MD  meclizine (ANTIVERT) 25 MG tablet Take 1 tablet (25 mg total) by mouth 2 (two) times daily as needed for up to 20 doses for dizziness. Patient not taking: Reported on 01/14/2022 05/22/20   Lennice Sites, DO  Potassium 99 MG  TABS Take 99 mg by mouth daily.  Patient not taking: Reported on 01/14/2022    [provider]  traMADol (ULTRAM) 50 MG tablet Take 1 tablet (50 mg total) by mouth every 12 (twelve) hours as needed for severe pain. Patient not taking: Reported on 01/14/2022 12/12/21   Patrecia Pour, MD    Inpatient Medications: Scheduled Meds:  Chlorhexidine Gluconate Cloth  6 each Topical Daily   clopidogrel  75 mg Oral Daily   enoxaparin (LOVENOX) injection  40 mg Subcutaneous Q24H   feeding supplement  237 mL Oral BID BM   polyethylene glycol  17 g Oral Daily   senna-docusate  2 tablet Oral BID   tamsulosin  0.4 mg Oral QPC supper   Continuous Infusions:  sodium chloride 10 mL/hr at 01/18/22 1506   cefTRIAXone (ROCEPHIN)  IV Stopped (01/18/22 1335)   PRN Meds: sodium chloride, acetaminophen, bisacodyl, iohexol, morphine injection, traMADol  Allergies:    Allergies  Allergen Reactions   Chicken Meat (Diagnostic) Other (See Comments)    Not listed on MAR   Chicken Protein     No chicken products No eggs No port   Pork-Derived Products Other (See Comments)    Social History:   Social History   Socioeconomic History   Marital status: Single    Spouse name: Not on file   Number of children: 2   Years of education: 20   Highest education level: Some college, no degree  Occupational History   Occupation: Lobbyist: [employer]    Comment: unemployed  Tobacco Use   Smoking status: Former    Types: Cigarettes    Quit date: 02/26/1980    Years since quitting: 41.9   Smokeless tobacco: Never  Vaping Use   Vaping Use: Never used  Substance and Sexual Activity   Alcohol use: No    Alcohol/week: 0.0 standard drinks of alcohol   Drug use: No   Sexual activity: Not Currently  Other Topics Concern   Not on file  Social History Narrative   Native of [place], raised poor  farming community. . To [place] '78 - finished College, all but Arts administrator. Married -  wife in [place], blocked from [place] to [place]. 1 son '90; 1 dtr '93. 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 Resources depleted.   Right handed.   Caffeine Hot daily one daily.   Social Determinants of Health   Financial Resource Strain: Low Risk  (08/10/2021)   Overall Financial Resource Strain (CARDIA)    Difficulty of Paying Living Expenses: Not hard at all  Food Insecurity: No Food Insecurity (08/10/2021)   Hunger Vital Sign    Worried About Running Out of Food in the Last Year: Never true    Ran Out of Food in the Last Year: Never true  Transportation Needs: No Transportation Needs (12/02/2021)   PRAPARE - Hydrologist (Medical): No    Lack of Transportation (Non-Medical): No  Physical Activity: Sufficiently Active (08/10/2021)   Exercise Vital Sign    Days of Exercise per Week: 5 days    Minutes of Exercise per Session: 30 min  Stress: No Stress Concern Present (08/10/2021)   [place]    Feeling of Stress : Not at all  Social Connections: Unknown (08/10/2021)   Social Connection and Isolation Panel [NHANES]    Frequency of Communication with Friends and Family: More than three times a week    Frequency of Social Gatherings with Friends and Family: Once a week    Attends Religious Services: Never    Marine scientist or Organizations: No    Attends Archivist Meetings: Never    Marital Status: Not on file  Intimate Partner Violence: Not At Risk (08/10/2021)   Humiliation, Afraid, Rape, and Kick questionnaire    Fear of Current or Ex-Partner: No    Emotionally Abused: No    Physically Abused: No    Sexually Abused: No    Family History:    Family History  Problem Relation Age of Onset   Diabetes Mother    Stroke Brother    Hearing loss Brother    Colon cancer Neg Hx    Rectal cancer Neg Hx    Stomach cancer Neg Hx    Esophageal cancer Neg  Hx      ROS:  Please see the history of present illness.  General:admitted with fever possible sepsis no weight changes Skin:no rashes or ulcers HEENT:no blurred vision, no congestion CV:see HPI PUL:see HPI GI:no diarrhea constipation or melena, no indigestion GU:no hematuria, no dysuria, hx obstructive urolpathy. MS:no joint pain, no claudication Neuro:no syncope, no lightheadedness recent CVA with expressive aphasia Endo:pre- diabetes, no thyroid disease  All other ROS reviewed and negative.     Physical Exam/Data:   Vitals:   01/18/22 2120 01/19/22 0244 01/19/22 0444 01/19/22 0600  BP:  (!) 150/89 123/81   Pulse:  (!) 104 95   Resp:  (!) 22 (!) 21   Temp: 98 F (36.7 C) 99.2 F (37.3 C) (!) 97.5 F (36.4 C)   TempSrc:  Oral Oral   SpO2:  94%    Weight:    108.5 kg  Height:        Intake/Output Summary (Last 24 hours) at 01/19/2022 0755 Last data filed at 01/19/2022 0301 Gross per 24 hour  Intake 355.6 ml  Output 2100 ml  Net -1744.4 ml      01/19/2022    6:00 AM 01/17/2022  4:00 AM 01/16/2022    5:00 AM  Last 3 Weights  Weight (lbs) 239 lb 3.2 oz 260 lb 9.3 oz 260 lb 2.3 oz  Weight (kg) 108.5 kg 118.2 kg 118 kg     Body mass index is 31.56 kg/m.  General:  Well nourished, well developed, in no acute distress HEENT: normal Neck: mild JVD Vascular: No carotid bruits; Distal pulses 2+ bilaterally Cardiac:  normal S1, S2; RRR; no murmur gallup rub or click Lungs:  clear to auscultation bilaterally, no wheezing, rhonchi or rales -anteriorly Abd: soft, nontender, no hepatomegaly  Ext: + edema with feet, + knee swelling on lt with pain Musculoskeletal:  No deformities, BUE and BLE strength normal and equal Lt knee with some swelling Skin: warm and dry  Neuro:  difficulty with speech follows commands, no focal abnormalities noted Psych:  Normal affect    Relevant CV Studies: Echo 12/18/21 IMPRESSIONS     1. Left ventricular ejection fraction, by  estimation, is 55 to 60%. The  left ventricle has normal function. The left ventricle has no regional  wall motion abnormalities. There is mild left ventricular hypertrophy.  Left ventricular diastolic parameters  are indeterminate.   2. Right ventricular systolic function is normal. The right ventricular  size is normal. There is mildly elevated pulmonary artery systolic  pressure. The estimated right ventricular systolic pressure is 82.9 mmHg.   3. The mitral valve is normal in structure. Trivial mitral valve  regurgitation.   4. The aortic valve was not well visualized. Aortic valve regurgitation  is moderate. Aortic valve sclerosis/calcification is present, without any  evidence of aortic stenosis.   5. Aortic dilatation noted. There is dilatation of the aortic root,  measuring 41 mm. There is dilatation of the ascending aorta, measuring 40  mm.   6. The inferior vena cava is dilated in size with <50% respiratory  variability, suggesting right atrial pressure of 15 mmHg.   Conclusion(s)/Recommendation(s): AI appears at least moderate. Recommend  cardiac MRI to quantify AI severity.   FINDINGS   Left Ventricle: Left ventricular ejection fraction, by estimation, is 55  to 60%. The left ventricle has normal function. The left ventricle has no  regional wall motion abnormalities. The left ventricular internal cavity  size was normal in size. There is   mild left ventricular hypertrophy. Left ventricular diastolic parameters  are indeterminate.   Right Ventricle: The right ventricular size is normal. No increase in  right ventricular wall thickness. Right ventricular systolic function is  normal. There is mildly elevated pulmonary artery systolic pressure. The  tricuspid regurgitant velocity is 2.58   m/s, and with an assumed right atrial pressure of 15 mmHg, the estimated  right ventricular systolic pressure is 93.7 mmHg.   Left Atrium: Left atrial size was normal in size.    Right Atrium: Right atrial size was normal in size.   Pericardium: There is no evidence of pericardial effusion.   Mitral Valve: The mitral valve is normal in structure. Trivial mitral  valve regurgitation.   Tricuspid Valve: The tricuspid valve is normal in structure. Tricuspid  valve regurgitation is trivial.   Aortic Valve: The aortic valve was not well visualized. Aortic valve  regurgitation is moderate. Aortic regurgitation PHT measures 854 msec.  Aortic valve sclerosis/calcification is present, without any evidence of  aortic stenosis.   Pulmonic Valve: The pulmonic valve was not well visualized. Pulmonic valve  regurgitation is mild.   Aorta: Aortic dilatation noted. There is dilatation of  the aortic root,  measuring 41 mm. There is dilatation of the ascending aorta, measuring 40  mm.   Venous: The inferior vena cava is dilated in size with less than 50%  respiratory variability, suggesting right atrial pressure of 15 mmHg.   IAS/Shunts: The interatrial septum was not well visualized.      Echo with bubble study 12/09/21 IMPRESSIONS     1. Left ventricular ejection fraction, by estimation, is 55 to 60%. The  left ventricle has normal function. The left ventricle has no regional  wall motion abnormalities. There is mild concentric left ventricular  hypertrophy. Left ventricular diastolic  parameters are indeterminate.   2. Right ventricular systolic function is normal. The right ventricular  size is normal.   3. The mitral valve is normal in structure. No evidence of mitral valve  regurgitation. No evidence of mitral stenosis.   4. The aortic valve is tricuspid. There is mild thickening of the aortic  valve. Aortic valve regurgitation is mild to moderate. Aortic valve  sclerosis/calcification is present, without any evidence of aortic  stenosis.   5. There is mild dilatation of the ascending aorta, measuring 38 mm.  There is moderate dilatation of the aortic  root, measuring 41 mm.   6. The inferior vena cava is normal in size with greater than 50%  respiratory variability, suggesting right atrial pressure of 3 mmHg.   FINDINGS   Left Ventricle: Left ventricular ejection fraction, by estimation, is 55  to 60%. The left ventricle has normal function. The left ventricle has no  regional wall motion abnormalities. The left ventricular internal cavity  size was normal in size. There is   mild concentric left ventricular hypertrophy. Left ventricular diastolic  parameters are indeterminate.   Right Ventricle: The right ventricular size is normal. No increase in  right ventricular wall thickness. Right ventricular systolic function is  normal.   Left Atrium: Left atrial size was normal in size.   Right Atrium: Right atrial size was normal in size.   Pericardium: There is no evidence of pericardial effusion.   Mitral Valve: The mitral valve is normal in structure. No evidence of  mitral valve regurgitation. No evidence of mitral valve stenosis.   Tricuspid Valve: The tricuspid valve is normal in structure. Tricuspid  valve regurgitation is not demonstrated. No evidence of tricuspid  stenosis.   Aortic Valve: The aortic valve is tricuspid. There is mild thickening of  the aortic valve. Aortic valve regurgitation is mild to moderate. Aortic  regurgitation PHT measures 452 msec. Aortic valve sclerosis/calcification  is present, without any evidence  of aortic stenosis. Aortic valve peak gradient measures 8.6 mmHg.   Pulmonic Valve: The pulmonic valve was normal in structure. Pulmonic valve  regurgitation is not visualized. No evidence of pulmonic stenosis.   Aorta: There is mild dilatation of the ascending aorta, measuring 38 mm.  There is moderate dilatation of the aortic root, measuring 41 mm.   Venous: The inferior vena cava is normal in size with greater than 50%  respiratory variability, suggesting right atrial pressure of 3 mmHg.    IAS/Shunts: No atrial level shunt detected by color flow Doppler. Agitated  saline contrast was given intravenously to evaluate for intracardiac  shunting.    Laboratory Data:  High Sensitivity Troponin:  No results for input(s): "TROPONINIHS" in the last 720 hours.   Chemistry Recent Labs  Lab 01/16/22 0525 01/17/22 0609 01/18/22 0456  NA 144 143 143  K 3.4* 3.5 3.3*  CL 111 111 110  CO2 '24 25 24  '$ GLUCOSE 112* 112* 96  BUN 27* 28* 34*  CREATININE 1.84* 1.96* 2.03*  CALCIUM 8.5* 8.7* 8.6*  MG 1.8 1.9 2.0  GFRNONAA 39* 36* 34*  ANIONGAP '9 7 9    '$ Recent Labs  Lab 01/14/22 0507 01/15/22 0651 01/17/22 0609  PROT 5.8* 6.2* 6.2*  ALBUMIN 2.4* 2.5* 2.3*  AST '29 27 26  '$ ALT '16 19 19  '$ ALKPHOS 82 79 86  BILITOT 1.0 0.9 0.8   Lipids No results for input(s): "CHOL", "TRIG", "HDL", "LABVLDL", "LDLCALC", "CHOLHDL" in the last 168 hours.  Hematology Recent Labs  Lab 01/17/22 0609 01/18/22 0456 01/19/22 0657  WBC 13.9* 15.4* 13.2*  RBC 4.18* 4.11* 3.98*  HGB 12.0* 12.1* 11.5*  HCT 36.7* 36.5* 35.1*  MCV 87.8 88.8 88.2  MCH 28.7 29.4 28.9  MCHC 32.7 33.2 32.8  RDW 15.6* 15.9* 15.9*  PLT 380 429* 506*   Thyroid No results for input(s): "TSH", "FREET4" in the last 168 hours.  BNPNo results for input(s): "BNP", "PROBNP" in the last 168 hours.  DDimer No results for input(s): "DDIMER" in the last 168 hours.   Radiology/Studies:  CT MAXILLOFACIAL WO CONTRAST  Result Date: 01/16/2022 CLINICAL DATA:  Fever.  Maxillofacial abscess. EXAM: CT MAXILLOFACIAL WITHOUT CONTRAST TECHNIQUE: Multidetector CT imaging of the maxillofacial structures was performed. Multiplanar CT image reconstructions were also generated. RADIATION DOSE REDUCTION: This exam was performed according to the departmental dose-optimization program which includes automated exposure control, adjustment of the mA and/or kV according to patient size and/or use of iterative reconstruction technique. COMPARISON:   None Available. FINDINGS: Osseous: Acute osseous abnormality is present. The mandible is intact and located. No acute or healing fractures are present. Degenerative changes are present in the upper cervical spine. Orbits: The globes and orbits are within normal limits. Sinuses: A polyp or mucous retention cyst is present in the right maxillary sinus. The paranasal sinuses and mastoid air cells are otherwise clear. Soft tissues: Soft tissues the face are unremarkable. No focal inflammatory changes are present. Salivary glands and ducts are within normal limits. Upper neck is within normal limits as well. Limited intracranial: Atherosclerotic calcifications are present within the cavernous internal carotid arteries. No acute intracranial abnormality is present. IMPRESSION: 1. No acute or healing fractures. 2. Polyp or mucous retention cyst in the right maxillary sinus. 3. Atherosclerosis. 4. Degenerative changes in the upper cervical spine. Electronically Signed   By: San Morelle M.D.   On: 01/16/2022 16:22   DG Knee 1-2 Views Left  Result Date: 01/16/2022 CLINICAL DATA:  Left knee swelling with pain EXAM: LEFT KNEE - 1-2 VIEW COMPARISON:  None. FINDINGS: There is anterior soft tissue swelling and joint effusion. There is no acute fracture or dislocation identified. There is tricompartmental osteophyte formation. There is patellofemoral compartment joint space narrowing. No focal osseous lesions are seen. IMPRESSION: 1. Anterior knee soft tissue swelling and joint effusion. 2. No acute fracture or dislocation. 3. Tricompartmental osteoarthrosis. Electronically Signed   By: Ronney Asters M.D.   On: 01/16/2022 15:53     Assessment and Plan:   Acute on chronic diastolic CHF with volume overload this admit with treatment of possible sepsis and AKI.  Treated with 60 mg IV lasix yesterday.  Remains volume overloaded, would give IV Lasix 60 mg today.  Check chest x-ray, BNP SVT on monitor.  With recent CVA  concern for embolic cause- pt was to wear 30 day event monitor. No  a fib seen with hospitalization in July.  Now with runs 8-13 beats of SVT. Continue to monitor on tele. Will check on event monitor   pt has been on metoprolol 50 BID  but stopped with this admit with illness would add back at this point at 25 mg twice daily Acute metabolic encephalopathy due to possible sepsis, per IM  no acute changes on MRI of head Possible sepsis, on ABXs + UTI on admit, cultures neg. Per IM AKI on adit with Cr 2.83 had improved  to 1.93 but today 2.17 Hypokalemia K+ has been low freq this admit with replacement. Today 3.2 and replacement ordered Recent CVA has been in rehab and on plavix per neuro. MRI this admit stable AI - plans for cMRI is scheduled for Oct to eval his aortic insuff.  RBBB chronic No hx of MI CAD, neg nuc study for ischemia in 2020   Risk Assessment/Risk Scores:        New York Heart Association (NYHA) Functional Class NYHA Class III        For questions or updates, please contact [facility] Please consult [email or web address] for contact info under    Signed, Cecilie Kicks, NP  01/19/2022 7:55 AM   Patient seen and examined.  Agree with above documentation.  [name] is a 71 year old male with a history of aortic insufficiency, chronic diastolic heart failure, CKD, CVA, prostate cancer, hypertension who were consulted for evaluation of heart failure at the request of Dr. Starla Link.  He was hospitalized in July 2023 with acute CVA.  Had plan for 30-day monitor but does not appear this was done.  He was admitted 01/13/2022 with altered mental status.  Found to have fever up to 102 F.  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  Had AKI on admission with creatinine up to 2.82 from prior 1.96.  He was treated for possible pneumonia.  He had been getting IV fluids, yesterday he was given IV Lasix due to concern for volume overload.  Yesterday he had episode of SVT on  monitor with heart rate up to 170.  Vitals today notable for BP 123/81, pulse 95, SPO2 94% on room air.  Labs notable for creatinine 2.17 (1.84 > 1.96 > 2.03 > 2.17), potassium 3.2, WBC 13.2, hemoglobin 11.5, platelets 506, procalcitonin 0.29 > 0.32.  EKG 01/15/2022 shows sinus tachycardia with PACs, rate 110, right bundle branch block, left anterior fascicular block.  On exam, patient is oriented to person only, tachycardic, regular rhythm, no murmurs, diminished breath sounds, trace lower extremity edema, + JVD.  For his acute on chronic diastolic heart failure, he remains hypervolemic on exam.  Will diurese with IV Lasix 60 mg today.  Potassium low this morning, being repleted.  Planning cardiac MRI as outpatient to evaluate aortic regurgitation.  May be able to do as inpatient once more euvolemic.  For his SVT, restarted metoprolol 25 mg twice daily.  He will need 30 day monitor on discharge to evaluate for A-fib given his recent CVA.  Donald Heinz, MD

## 2022-01-19 NOTE — TOC Progression Note (Addendum)
Transition of Care [facility]) - Progression Note    Patient Details  Name: [name] MRN: [id number] Date of Birth: 07-29-1950  Transition of Care [facility]) CM/SW Contact  Ross Ludwig, [place] Phone Number: 01/19/2022, 10:37 AM  Clinical Narrative:    Patient is from [facility] was under short term rehab, but was denied rehab days in July.  Per [facility] patient and family have started the Medicaid process for LTC.  Per [facility], patient can return.  CSW contacted Bernadene Bell to get status on insurance approval, per [employer], insurance is pending and has gone to the Market researcher to review.  CSW uploaded updated therapy notes for patient.  1:00pm  CSW received message from [facility] that they are wanting to complete a peer to peer for patient to get approval for SNF placement.  1:15pm  CSW received a message from attending physician that patient is not medically read for discharge today.  CSW contacted Bernadene Bell and spoke to the case worker Tarry Kos, and informed her that patient is not medically ready for discharge now.  CSW requested to withdraw the authorization.  Per insurance company they will withdraw the authorization per CSW request.  Once patient is medically ready for discharge, insurance authorization will have to be started again.  CSW updated attending physician.       Expected Discharge Plan: Skilled Nursing Facility Barriers to Discharge: Continued Medical Work up  Expected Discharge Plan and Services Expected Discharge Plan: [place] In-house Referral: Clinical Social Work Discharge Planning Services: CM Consult Post Acute Care Choice: [employer] Living arrangements for the past 2 months: [place]                 DME Arranged: N/A DME Agency: NA                   Social Determinants of Health (SDOH) Interventions    Readmission Risk Interventions    10/22/2019   12:04 PM  Readmission Risk Prevention  Plan  Transportation Screening Complete  PCP or Specialist Appt within 3-5 Days Not Complete  Not Complete comments pending medical stability  HRI or Home Care Consult Complete  Social Work Consult for [place] Planning/Counseling Complete  Palliative Care Screening Not Applicable  Medication Review [employer]) Referral to Pharmacy

## 2022-01-20 DIAGNOSIS — R410 Disorientation, unspecified: Secondary | ICD-10-CM | POA: Diagnosis not present

## 2022-01-20 DIAGNOSIS — I471 Supraventricular tachycardia: Secondary | ICD-10-CM | POA: Diagnosis not present

## 2022-01-20 DIAGNOSIS — Z8673 Personal history of transient ischemic attack (TIA), and cerebral infarction without residual deficits: Secondary | ICD-10-CM | POA: Diagnosis not present

## 2022-01-20 DIAGNOSIS — I4729 Other ventricular tachycardia: Secondary | ICD-10-CM

## 2022-01-20 DIAGNOSIS — N179 Acute kidney failure, unspecified: Secondary | ICD-10-CM | POA: Diagnosis not present

## 2022-01-20 DIAGNOSIS — I5033 Acute on chronic diastolic (congestive) heart failure: Secondary | ICD-10-CM | POA: Diagnosis not present

## 2022-01-20 LAB — CBC WITH DIFFERENTIAL/PLATELET
Abs Immature Granulocytes: 0.24 10*3/uL — ABNORMAL HIGH (ref 0.00–0.07)
Basophils Absolute: 0.1 10*3/uL (ref 0.0–0.1)
Basophils Relative: 1 %
Eosinophils Absolute: 0.2 10*3/uL (ref 0.0–0.5)
Eosinophils Relative: 2 %
HCT: 35.7 % — ABNORMAL LOW (ref 39.0–52.0)
Hemoglobin: 11.7 g/dL — ABNORMAL LOW (ref 13.0–17.0)
Immature Granulocytes: 2 %
Lymphocytes Relative: 19 %
Lymphs Abs: 2 10*3/uL (ref 0.7–4.0)
MCH: 29.2 pg (ref 26.0–34.0)
MCHC: 32.8 g/dL (ref 30.0–36.0)
MCV: 89 fL (ref 80.0–100.0)
Monocytes Absolute: 0.9 10*3/uL (ref 0.1–1.0)
Monocytes Relative: 9 %
Neutro Abs: 6.8 10*3/uL (ref 1.7–7.7)
Neutrophils Relative %: 67 %
Platelets: 550 10*3/uL — ABNORMAL HIGH (ref 150–400)
RBC: 4.01 MIL/uL — ABNORMAL LOW (ref 4.22–5.81)
RDW: 16 % — ABNORMAL HIGH (ref 11.5–15.5)
WBC: 10.1 10*3/uL (ref 4.0–10.5)
nRBC: 0 % (ref 0.0–0.2)

## 2022-01-20 LAB — BASIC METABOLIC PANEL
Anion gap: 9 (ref 5–15)
BUN: 40 mg/dL — ABNORMAL HIGH (ref 8–23)
CO2: 27 mmol/L (ref 22–32)
Calcium: 8.7 mg/dL — ABNORMAL LOW (ref 8.9–10.3)
Chloride: 109 mmol/L (ref 98–111)
Creatinine, Ser: 2.07 mg/dL — ABNORMAL HIGH (ref 0.61–1.24)
GFR, Estimated: 34 mL/min — ABNORMAL LOW (ref >60)
Glucose, Bld: 107 mg/dL — ABNORMAL HIGH (ref 70–99)
Potassium: 3.2 mmol/L — ABNORMAL LOW (ref 3.5–5.1)
Sodium: 145 mmol/L (ref 135–145)

## 2022-01-20 LAB — MAGNESIUM: Magnesium: 1.9 mg/dL (ref 1.7–2.4)

## 2022-01-20 LAB — BRAIN NATRIURETIC PEPTIDE: B Natriuretic Peptide: 41.7 pg/mL (ref 0.0–100.0)

## 2022-01-20 MED ORDER — FUROSEMIDE 40 MG PO TABS
40.0000 mg | ORAL_TABLET | Freq: Every day | ORAL | Status: DC
  Administered 2022-01-20 – 2022-01-27 (×8): 40 mg via ORAL
  Filled 2022-01-20 (×8): qty 1

## 2022-01-20 MED ORDER — POTASSIUM CHLORIDE CRYS ER 20 MEQ PO TBCR
40.0000 meq | EXTENDED_RELEASE_TABLET | Freq: Once | ORAL | Status: AC
Start: 2022-01-20 — End: 2022-01-20
  Administered 2022-01-20: 40 meq via ORAL
  Filled 2022-01-20: qty 2

## 2022-01-20 NOTE — Progress Notes (Signed)
Progress Note  Patient Name: [name] Date of Encounter: 01/20/2022  [facility] Cardiologist: Mertie Moores, MD   Subjective   Net -1.4 L yesterday, -5.8 L on admission.  Creatinine stable at 2.07.  Patient with expressive aphasia but reporting knee pain  Inpatient Medications    Scheduled Meds:  Chlorhexidine Gluconate Cloth  6 each Topical Daily   clopidogrel  75 mg Oral Daily   enoxaparin (LOVENOX) injection  40 mg Subcutaneous Q24H   feeding supplement  237 mL Oral BID BM   metoprolol tartrate  25 mg Oral BID   polyethylene glycol  17 g Oral Daily   senna-docusate  2 tablet Oral BID   tamsulosin  0.4 mg Oral QPC supper   Continuous Infusions:  sodium chloride 10 mL/hr at 01/18/22 1506   PRN Meds: sodium chloride, acetaminophen, bisacodyl, iohexol, morphine injection, traMADol   Vital Signs    Vitals:   01/19/22 2058 01/20/22 0413 01/20/22 0520 01/20/22 0857  BP: 124/87 (!) 148/93  (!) 140/88  Pulse: 95 93  98  Resp: '19 20  20  '$ Temp: 98.8 F (37.1 C) 99.3 F (37.4 C)  97.8 F (36.6 C)  TempSrc:    Oral  SpO2: 96% 95%  95%  Weight:   109.2 kg   Height:        Intake/Output Summary (Last 24 hours) at 01/20/2022 1443 Last data filed at 01/20/2022 1208 Gross per 24 hour  Intake 957.38 ml  Output 2801 ml  Net -1843.62 ml      01/20/2022    5:20 AM 01/19/2022    6:00 AM 01/17/2022    4:00 AM  Last 3 Weights  Weight (lbs) 240 lb 11.9 oz 239 lb 3.2 oz 260 lb 9.3 oz  Weight (kg) 109.2 kg 108.5 kg 118.2 kg      Telemetry    Normal sinus rhythm, NSVT x4 beats- Personally Reviewed  ECG     No new ECG- Personally Reviewed  Physical Exam   GEN: No acute distress.   Neck: Mildly elevated JVD Cardiac: RRR, no murmurs, rubs, or gallops.  Respiratory: Clear to auscultation bilaterally. GI: Soft, nontender, non-distended  MS: Trace edema Neuro:  Expressive aphasia Psych: Unable to assess  Labs    High Sensitivity Troponin:  No results for  input(s): "TROPONINIHS" in the last 720 hours.   Chemistry Recent Labs  Lab 01/14/22 0507 01/15/22 9485 01/16/22 0525 01/17/22 0609 01/18/22 0456 01/19/22 0657 01/20/22 0744  NA 139 142   < > 143 143 143 145  K 3.5 3.1*   < > 3.5 3.3* 3.2* 3.2*  CL 106 109   < > 111 110 110 109  CO2 26 26   < > '25 24 25 27  '$ GLUCOSE 106* 108*   < > 112* 96 103* 107*  BUN 31* 32*   < > 28* 34* 43* 40*  CREATININE 2.71* 2.13*   < > 1.96* 2.03* 2.17* 2.07*  CALCIUM 8.2* 8.4*   < > 8.7* 8.6* 8.7* 8.7*  MG 1.9 1.8   < > 1.9 2.0 1.9 1.9  PROT 5.8* 6.2*  --  6.2*  --   --   --   ALBUMIN 2.4* 2.5*  --  2.3*  --   --   --   AST 29 27  --  26  --   --   --   ALT 16 19  --  19  --   --   --  ALKPHOS 82 79  --  86  --   --   --   BILITOT 1.0 0.9  --  0.8  --   --   --   GFRNONAA 24* 32*   < > 36* 34* 32* 34*  ANIONGAP 7 7   < > '7 9 8 9   '$ < > = values in this interval not displayed.    Lipids No results for input(s): "CHOL", "TRIG", "HDL", "LABVLDL", "LDLCALC", "CHOLHDL" in the last 168 hours.  Hematology Recent Labs  Lab 01/18/22 0456 01/19/22 0657 01/20/22 0744  WBC 15.4* 13.2* 10.1  RBC 4.11* 3.98* 4.01*  HGB 12.1* 11.5* 11.7*  HCT 36.5* 35.1* 35.7*  MCV 88.8 88.2 89.0  MCH 29.4 28.9 29.2  MCHC 33.2 32.8 32.8  RDW 15.9* 15.9* 16.0*  PLT 429* 506* 550*   Thyroid No results for input(s): "TSH", "FREET4" in the last 168 hours.  BNP Recent Labs  Lab 01/20/22 0744  BNP 41.7    DDimer No results for input(s): "DDIMER" in the last 168 hours.   Radiology    DG CHEST PORT 1 VIEW  Result Date: 01/19/2022 CLINICAL DATA:  Dyspnea. EXAM: PORTABLE CHEST 1 VIEW COMPARISON:  01/13/2022 FINDINGS: Hypoventilation with mild atelectasis in the lung bases similar to the prior study. No acute infiltrate or effusion. Negative for heart failure. IMPRESSION: Hypoventilation with mild atelectasis in the lung bases. Electronically Signed   By: Franchot Gallo M.D.   On: 01/19/2022 15:42    Cardiac Studies      Patient Profile     71 y.o. male with a history of aortic insufficiency, chronic diastolic heart failure, CKD, CVA, prostate cancer, hypertension who were consulted for evaluation of heart failure   Assessment & Plan    Acute on chronic diastolic heart failure: Volume overloaded due to IV fluid resuscitation for sepsis.  Received IV Lasix 60 mg on 8/14 and 8/15.  Volume status improved, appears mildly hypovolemic -Transition to p.o. Lasix 40 mg daily  SVT: Having brief runs of SVT.  Had been on metoprolol 50 mg twice daily at home.  Restarted metoprolol 25 mg twice daily  CVA: Had recent CVA and has been in rehab and on Plavix per neurology.  Aortic regurgitation: Moderate AI on recent echo, recommended MRI to quantify aortic regurgitation.  Scheduled for outpatient.  AKI: Creatinine 2.83 on admission, now stable at 2.07 which appears about baseline  NSVT: Maintain K >4, mag >2  For questions or updates, please contact [facility] Please consult [email or web address] for contact info under        Signed, Donato Heinz, MD  01/20/2022, 2:43 PM

## 2022-01-20 NOTE — Plan of Care (Signed)
No acute events this shift. Problem: Clinical Measurements: Goal: Diagnostic test results will improve Outcome: Progressing Goal: Respiratory complications will improve Outcome: Progressing Goal: Cardiovascular complication will be avoided Outcome: Progressing   Problem: Activity: Goal: Risk for activity intolerance will decrease Outcome: Progressing   Problem: Nutrition: Goal: Adequate nutrition will be maintained Outcome: Progressing   Problem: Coping: Goal: Level of anxiety will decrease Outcome: Progressing   Problem: Elimination: Goal: Will not experience complications related to bowel motility Outcome: Progressing Goal: Will not experience complications related to urinary retention Outcome: Progressing   Problem: Pain Managment: Goal: General experience of comfort will improve Outcome: Progressing

## 2022-01-20 NOTE — Progress Notes (Signed)
Physical Therapy Treatment Patient Details Name: [name] MRN: [id number] DOB: [birth year] Today's Date: 01/20/2022   History of Present Illness 71 y.o. 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  On presentation, EMS reported temperature of 102.  In the ED, he was tachycardic, tachypneic with mild leukocytosis and creatinine elevated. Dx of acute metabolic encephalopathy, possible sepsis (PNA vs infectious diarrhea vs UTI).    PT Comments    Pt asleep upon entry but agreeable to be seen. Reporting pain in BLE, L>R today with edema in both. Required min assist for rolling in bed, mod assist +2 for bed mobility otherwise secondary to pain and weakness. Pt sat EOB for 5+min without assistance to complete some BUE and BLE exercises. Pt required assistance to complete RLE and RUE exercises. After exercises were completed, pt complained of pain and fatigue and asked to be returned to bed; pt placed in chair position in bed with bed alarm on upon exit. Pt is progressing and discharge destination remains appropriate. We will continue to follow acutely.    Recommendations for follow up therapy are one component of a multi-disciplinary discharge planning process, led by the attending physician.  Recommendations may be updated based on patient status, additional functional criteria and insurance authorization.  Follow Up Recommendations  Skilled nursing-short term rehab (<3 hours/day) Can patient physically be transported by private vehicle: No   Assistance Recommended at Discharge Frequent or constant Supervision/Assistance  Patient can return home with the following Two people to help with walking and/or transfers;Two people to help with  bathing/dressing/bathroom;Assistance with cooking/housework;Direct supervision/assist for medications management;Assist for transportation;Help with stairs or ramp for entrance;Direct supervision/assist for financial management   Equipment Recommendations  None recommended by PT    Recommendations for Other Services       Precautions / Restrictions Precautions Precautions: Fall Precaution Comments: severe knee pain Restrictions Weight Bearing Restrictions: No     Mobility  Bed Mobility Overal bed mobility: Needs Assistance Bed Mobility: Supine to Sit, Sit to Supine, Rolling Rolling: Min assist   Supine to sit: +2 for physical assistance, +2 for safety/equipment, HOB elevated, Mod assist Sit to supine: +2 for physical assistance, +2 for safety/equipment, Mod assist   General bed mobility comments: Incr time d/t pain. pt is able to self assist with LLE using BUE, with incr time able to self assist with trunk, mod assist to bring BLE off bed, grimacing in pain. Pt able to maintain upright sitting without assistance for 43mn on EOB, completing some BUE and BLE exercises in sitting without any assistance from PT. Pt complaining of abdominal pain and fatigue so returned to supine    Transfers                   General transfer comment: unable to perform 2* severe pain with attempted movement of BLEs    Ambulation/Gait               General Gait Details: unsafe at present   Stairs             Wheelchair Mobility    Modified Rankin (Stroke Patients Only)       Balance  Cognition Arousal/Alertness: Awake/alert Behavior During Therapy: WFL for tasks assessed/performed Overall Cognitive Status: No family/caregiver present to determine baseline cognitive functioning                                 General Comments: able to communicate in [place], per RN speaks  speaks [place] and  4-5 other languages, dysarthria, expressive aphasia, some perseveration        Exercises General Exercises - Upper Extremity Digit Composite Flexion: AROM, Right, 5 reps, Other (comment) (Making a fist) General Exercises - Lower Extremity Ankle Circles/Pumps: AROM, Both, 5 reps Hip Flexion/Marching: AROM, AAROM, Both, 10 reps, Seated (Pt completed L actively without assist, R with some assist from PT)    General Comments        Pertinent Vitals/Pain Pain Assessment Pain Assessment: Faces Faces Pain Scale: Hurts even more Breathing: normal Negative Vocalization: none Facial Expression: smiling or inexpressive Body Language: relaxed Consolability: no need to console PAINAD Score: 0 Pain Location: B feet, L>R Pain Descriptors / Indicators: Grimacing, Guarding Pain Intervention(s): Limited activity within patient's tolerance, Monitored during session, Repositioned    Home Living                          Prior Function            PT Goals (current goals can now be found in the care plan section) Acute Rehab PT Goals PT Goal Formulation: With patient Time For Goal Achievement: 01/29/22 Potential to Achieve Goals: Fair Progress towards PT goals: Progressing toward goals    Frequency    Min 2X/week      PT Plan Current plan remains appropriate    Co-evaluation              AM-PAC PT "6 Clicks" Mobility   Outcome Measure  Help needed turning from your back to your side while in a flat bed without using bedrails?: A Lot Help needed moving from lying on your back to sitting on the side of a flat bed without using bedrails?: A Lot Help needed moving to and from a bed to a chair (including a wheelchair)?: Total Help needed standing up from a chair using your arms (e.g., wheelchair or bedside chair)?: Total Help needed to walk in hospital room?: Total Help needed climbing 3-5 steps with a railing? : Total 6 Click Score: 8    End of Session    Activity Tolerance: Patient limited by pain Patient left: in bed;with bed alarm set;with call bell/phone within reach (in chair position) Nurse Communication: Mobility status PT Visit Diagnosis: Difficulty in walking, not elsewhere classified (R26.2);Pain Pain - Right/Left: Right (bil R>L) Pain - part of body: Leg     Time: 0867-6195 PT Time Calculation (min) (ACUTE ONLY): 20 min  Charges:                        Coolidge Breeze, PT, DPT [place] [facility] Department Office: [phone] Pager: [phone]   Coolidge Breeze 01/20/2022, 3:19 PM

## 2022-01-20 NOTE — Progress Notes (Signed)
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  On presentation, EMS reported temperature of 102.  In the ED, he was tachycardic, tachypneic with mild leukocytosis and creatinine elevated to 2.82 from prior of 1.96.  CT of abdomen and pelvis showed bibasilar atelectasis with superimposed developing infection/inflammation not excluded with fresh transition state of the colon, no diverticulitis.  He was started on IV fluids and antibiotics.  Blood and urine cultures negative so far.  He required Foley catheter placement for urinary retention  Subjective / 24h Interval events: Doing well today, still complains of swelling in his legs  Assesement and Plan: Principal Problem:   AMS (altered mental status) Active Problems:   Obstructive uropathy   Essential hypertension   BPH (benign prostatic hyperplasia)   Chronic kidney disease, stage 3b (HCC)   AKI (acute kidney injury) ([place])   Hypokalemia   Acute urinary retention   Aortic root dilatation (HCC)   Chronic heart failure with preserved ejection fraction (HFpEF) (HCC)   Expressive aphasia   Sepsis ([place])   History of CVA (cerebrovascular accident)   Acute metabolic encephalopathy   Leukocytosis   Obesity (BMI 30-39.9)   Physical deconditioning   SVT (supraventricular tachycardia) (HCC)   Acute on chronic diastolic heart failure (HCC)   Principal problem SIRS, possible sepsis, POA-unclear etiology, there was questionable bacterial pneumonia, infective diarrhea and concern for UTI.  He had fever, tachycardia, tachypnea and elevated  white count on admission.  Cultures have remained negative so far, GI pathogen panel and C. difficile were not run because diarrhea resolved.  Has completed 7 days of empiric antibiotics, now monitored off.  Cultures have remained negative  Active problems Acute metabolic encephalopathy-possibly from fever, sepsis.  Improved, suspect close to baseline.  Still has expressive aphasia from prior stroke.  Leukocytosis-WBC normalized  Normocytic anemia-From anemia of chronic disease. Hemoglobin stable. Monitor  Acute kidney injury on CKD stage IIIb -Creatinine 2.83 on presentation; was 1.96 on discharge last month.  Has received IV fluids initially, now discontinued.  Received Lasix 8/14 and 8/15 per cardiology due to fluid overload.  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  Patient possibly has residual aphasia with aphasia and right-sided weakness from prior stroke. MRI of brain during this admission was negative for any stroke. Continue Plavix.  Outpatient follow-up with neurology.   SVT -Patient had an episode of SVT with heart rate going up to 170 on 01/18/2022.  Cardiology has been consulted: Has been started on metoprolol.  Follow recommendations.   Chronic diastolic heart failure -Has slightly swollen lower extremities.  Strict input and output.  Daily weights.  Patient received Lasix 8/14, 8/15.  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 Recommend follow-up ultrasound every 3 years   BPH, acute urinary retention -With lower urinary tract symptoms.  Patient required in and out cath few  times and subsequently Foley catheter placed on 01/14/2022.  Flomax started on  01/14/2022.  Voiding trial today since UTI has been treated   Essential hypertension -Monitor blood pressure.   Physical deconditioning -patient will have to return back to SNF.  TOC following.  Hypokalemia -replace again today   Obesity -Outpatient follow-up   Chronic knee pain -Follows up with orthopedic/sports medicine as an outpatient for recurrent steroid injections.  Outpatient follow-up with orthopedics.  Scheduled Meds:  Chlorhexidine Gluconate Cloth  6 each Topical Daily   clopidogrel  75 mg Oral Daily   enoxaparin (LOVENOX) injection  40 mg Subcutaneous Q24H   feeding supplement  237 mL Oral BID BM   metoprolol tartrate  25 mg Oral BID   polyethylene glycol  17 g Oral Daily   senna-docusate  2 tablet Oral BID   tamsulosin  0.4 mg Oral QPC supper   Continuous Infusions:  sodium chloride 10 mL/hr at 01/18/22 1506   PRN Meds:.sodium chloride, acetaminophen, bisacodyl, iohexol, morphine injection, traMADol  Diet Orders (From admission, onward)     Start     Ordered   01/19/22 1741  Diet Heart Room service appropriate? Yes with Assist; Fluid consistency: Thin  Diet effective now       Question Answer Comment  Room service appropriate? Yes with Assist   Fluid consistency: Thin      01/19/22 1740            DVT prophylaxis: enoxaparin (LOVENOX) injection 40 mg Start: 01/13/22 2230   Lab Results  Component Value Date   PLT 550 (H) 01/20/2022      Code Status: Full Code  Family Communication: No family at bedside  Status is: Inpatient Remains inpatient appropriate because: Voiding trial today, await cardiology input   Level of care: Telemetry  Consultants:  cards   Objective: Vitals:   01/19/22 2058 01/20/22 0413 01/20/22 0520 01/20/22 0857  BP: 124/87 (!) 148/93  (!) 140/88  Pulse: 95 93  98  Resp: '19 20  20  '$ Temp: 98.8 F (37.1 C) 99.3 F (37.4 C)  97.8 F (36.6 C)  TempSrc:    Oral  SpO2: 96% 95%  95%  Weight:   109.2 kg   Height:         Intake/Output Summary (Last 24 hours) at 01/20/2022 1139 Last data filed at 01/20/2022 0857 Gross per 24 hour  Intake 957.38 ml  Output 2200 ml  Net -1242.62 ml   Wt Readings from Last 3 Encounters:  01/20/22 109.2 kg  12/09/21 117.5 kg  11/30/21 113.6 kg    Examination:  Constitutional: NAD Eyes: no scleral icterus ENMT: Mucous membranes are moist.  Neck: normal, supple Respiratory: clear to auscultation bilaterally, no wheezing, no crackles.  Cardiovascular: Regular rate and rhythm, no murmurs / rubs / gallops. Trace edema Abdomen: non distended, no tenderness. Bowel sounds positive.  Musculoskeletal: no clubbing / cyanosis.  Skin: no rashes Neurologic: non focal   Data Reviewed: I have independently reviewed following labs and imaging studies   CBC Recent Labs  Lab 01/16/22 0525 01/17/22 0609 01/18/22 0456 01/19/22 0657 01/20/22 0744  WBC 10.5 13.9* 15.4* 13.2* 10.1  HGB 12.5* 12.0* 12.1* 11.5* 11.7*  HCT 38.2* 36.7* 36.5* 35.1* 35.7*  PLT 325 380 429* 506* 550*  MCV 89.7 87.8 88.8 88.2 89.0  MCH 29.3 28.7 29.4 28.9 29.2  MCHC 32.7 32.7 33.2 32.8 32.8  RDW 15.2 15.6* 15.9* 15.9* 16.0*  LYMPHSABS 1.9 2.3 2.4 2.0 2.0  MONOABS 0.9 1.2* 1.2* 1.0 0.9  EOSABS 0.1 0.1 0.1 0.1 0.2  BASOSABS 0.0 0.1 0.1 0.1 0.1    Recent Labs  Lab 01/13/22 1512 01/13/22 1520 01/13/22 2132 01/13/22 2133 01/14/22 0032 01/14/22 0507 01/15/22 0651 01/16/22 0525 01/17/22 0609 01/18/22 0456 01/19/22 0657 01/20/22 0744  NA 138  --    < >  --   --  139 142 144 143 143 143 145  K 2.9*  --    < >  --   --  3.5 3.1* 3.4* 3.5 3.3* 3.2* 3.2*  CL 102  --    < >  --   --  106 109 111 111 110 110 109  CO2 25  --    < >  --   --  '26 26 24 25 24 25 27  '$ GLUCOSE 205*  --    < >  --   --  106* 108* 112* 112* 96 103* 107*  BUN 27*  --    < >  --   --  31* 32* 27* 28* 34* 43* 40*  CREATININE 2.82*  --    < >  --   --  2.71* 2.13* 1.84* 1.96* 2.03* 2.17* 2.07*  CALCIUM 8.5*  --    < >   --   --  8.2* 8.4* 8.5* 8.7* 8.6* 8.7* 8.7*  AST 33  --   --   --   --  29 27  --  26  --   --   --   ALT 21  --   --   --   --  16 19  --  19  --   --   --   ALKPHOS 93  --   --   --   --  82 79  --  86  --   --   --   BILITOT 1.9*  --   --   --   --  1.0 0.9  --  0.8  --   --   --   ALBUMIN 3.1*  --   --   --   --  2.4* 2.5*  --  2.3*  --   --   --   MG 1.8  --   --   --   --  1.9 1.8 1.8 1.9 2.0 1.9 1.9  CRP  --  34.1*  --   --   --   --   --   --  34.9*  --   --   --   PROCALCITON  --   --   --   --   --  0.62 0.29 0.32  --   --   --   --   LATICACIDVEN 1.6  --   --  2.6* 0.9 0.9  --   --   --   --   --   --   INR 1.2  --   --   --   --   --   --   --   --   --   --   --   AMMONIA  --   --   --  <10  --   --   --   --   --   --   --   --   BNP  --   --   --   --   --   --   --   --   --   --   --  41.7   < > = values in this interval not displayed.    ------------------------------------------------------------------------------------------------------------------ No results for input(s): "CHOL", "HDL", "LDLCALC", "TRIG", "CHOLHDL", "LDLDIRECT" in the last 72 hours.  Lab Results  Component Value Date   HGBA1C 5.6 12/09/2021   ------------------------------------------------------------------------------------------------------------------ No results for input(s): "TSH", "T4TOTAL", "T3FREE", "THYROIDAB" in the last 72 hours.  Invalid input(s): "FREET3"  Cardiac Enzymes No results for input(s): "CKMB", "TROPONINI", "MYOGLOBIN" in the last 168 hours.  Invalid input(s): "CK" ------------------------------------------------------------------------------------------------------------------    Component Value Date/Time   BNP 41.7 01/20/2022 0744    CBG: No results for input(s): "GLUCAP" in the last 168 hours.  Recent Results (from the past 240 hour(s))  Resp Panel by RT-PCR (Flu A&B, Covid) Anterior Nasal Swab     Status: None   Collection Time: 01/13/22  3:12 PM   Specimen:  Anterior Nasal Swab  Result Value Ref Range Status   SARS Coronavirus 2 by RT PCR NEGATIVE NEGATIVE Final    Comment: (NOTE) SARS-CoV-2 target nucleic acids are NOT DETECTED.  The SARS-CoV-2 RNA is generally detectable in upper respiratory specimens during the acute phase of infection. The lowest concentration of SARS-CoV-2 viral copies this assay can detect is 138 copies/mL. A negative result does not preclude SARS-Cov-2 infection and should not be used as the sole basis for treatment or other patient management decisions. A negative result may occur with  improper specimen collection/handling, submission of specimen other than nasopharyngeal swab, presence of viral mutation(s) within the areas targeted by this assay, and inadequate number of viral copies(<138 copies/mL). A negative result must be combined with clinical observations, patient history, and epidemiological information. The expected result is Negative.  Fact Sheet for Patients:  EntrepreneurPulse.com.au  Fact Sheet for Healthcare Providers:  IncredibleEmployment.be  This test is no t yet approved or cleared by the [place] FDA and  has been authorized for detection and/or diagnosis of SARS-CoV-2 by FDA under an Emergency Use Authorization (EUA). This EUA will remain  in effect (meaning this test can be used) for the duration of the COVID-19 declaration under Section 564(b)(1) of the Act, 21 U.S.C.section 360bbb-3(b)(1), unless the authorization is terminated  or revoked sooner.       Influenza A by PCR NEGATIVE NEGATIVE Final   Influenza B by PCR NEGATIVE NEGATIVE Final    Comment: (NOTE) The Xpert Xpress SARS-CoV-2/FLU/RSV plus assay is intended as an aid in the diagnosis of influenza from Nasopharyngeal swab specimens and should not be used as a sole basis for treatment. Nasal washings and aspirates are unacceptable for Xpert Xpress SARS-CoV-2/FLU/RSV testing.  Fact  Sheet for Patients: EntrepreneurPulse.com.au  Fact Sheet for Healthcare Providers: IncredibleEmployment.be  This test is not yet approved or cleared by the [place] FDA and has been authorized for detection and/or diagnosis of SARS-CoV-2 by FDA under an Emergency Use Authorization (EUA). This EUA will remain in effect (meaning this test can be used) for the duration of the COVID-19 declaration under Section 564(b)(1) of the Act, 21 U.S.C. section 360bbb-3(b)(1), unless the authorization is terminated or revoked.  Performed at [facility], [place] [address] [place], [place] [address]   Blood Culture (routine x 2)     Status: None   Collection Time: 01/13/22  3:12 PM   Specimen: BLOOD  Result Value Ref Range Status   Specimen Description   Final    BLOOD SITE NOT SPECIFIED Performed at [facility] [address] [place], [place] [address]  Special Requests   Final    BOTTLES DRAWN AEROBIC AND ANAEROBIC Blood Culture adequate volume Performed at [facility] [address] [place], [place] [address]    Culture   Final    NO GROWTH 5 DAYS Performed at [facility], [place] [address], [place], [place] [address]    Report Status 01/18/2022 FINAL  Final  Blood Culture (routine x 2)     Status: None   Collection Time: 01/13/22  3:40 PM   Specimen: BLOOD  Result Value Ref Range Status   Specimen Description   Final    BLOOD SITE NOT SPECIFIED Performed at [facility] [address], [place], [place] [address]    Special Requests   Final    BOTTLES DRAWN AEROBIC AND ANAEROBIC Blood Culture results may not be optimal due to an inadequate volume of blood received in culture bottles Performed at [facility] [address], [place], [place] [address]    Culture   Final    NO GROWTH 5 DAYS Performed at [facility], [place] [address] [place], [place] [address]    Report Status 01/18/2022 FINAL  Final  Urine Culture     Status: None   Collection Time: 01/13/22  6:45 PM   Specimen: In/Out Cath Urine  Result Value Ref Range Status   Specimen Description   Final    IN/OUT CATH URINE Performed at [facility] [address] [place], [place] [address]    Special Requests   Final    NONE Performed at [facility], [place] [address], [place], [place] [address]    Culture   Final    NO GROWTH Performed at [facility], [place] [address], [place], [place] [address]    Report Status 01/15/2022 FINAL  Final  MRSA Next Gen by PCR, Nasal     Status: None   Collection Time: 01/14/22 10:02 AM   Specimen: Nasal Mucosa; Nasal Swab  Result Value Ref Range Status   MRSA by PCR Next Gen NOT DETECTED NOT DETECTED Final    Comment: (NOTE) The GeneXpert MRSA Assay (FDA approved for NASAL specimens only), is one component of a comprehensive MRSA colonization surveillance program. It is not intended to diagnose MRSA infection nor to guide or monitor treatment for MRSA infections. Test performance is not FDA approved in patients less than 10 years old. Performed at [facility], [place] [address], [place], [place] [address]      Radiology Studies: DG CHEST PORT 1 VIEW  Result Date: 01/19/2022 CLINICAL DATA:  Dyspnea. EXAM: PORTABLE CHEST 1 VIEW COMPARISON:  01/13/2022 FINDINGS: Hypoventilation with mild atelectasis in the lung bases similar to the prior study. No acute infiltrate or effusion. Negative for heart failure. IMPRESSION: Hypoventilation with mild atelectasis in the lung bases. Electronically Signed   By: Franchot Gallo M.D.   On: 01/19/2022 15:42     Marzetta Board, MD, PhD [facility]  Between 7 am - 7 pm I am available, please contact me via Amion (for emergencies) or Securechat (non urgent messages)  Between 7 pm - 7 am I am not available, please  contact night coverage MD/APP via Amion

## 2022-01-20 NOTE — TOC Progression Note (Addendum)
Transition of Care [facility]) - Progression Note    Patient Details  Name: [name] MRN: [id number] Date of Birth: [birth year]  [facility]) CM/SW [place], LCSW Phone Number: 01/20/2022, 11:54 AM  Clinical Narrative:    Pt medically ready to return to SNF. Insurance authorization has been requested and currently pending approval.   Update 1345: Pt's insurance authorization request is being sent to the medical director for review as pt has been unable to ambulate with PT due to knee pain.   Expected Discharge Plan: Skilled Nursing Facility Barriers to Discharge: Continued Medical Work up  Expected Discharge Plan and Services Expected Discharge Plan: [place] In-house Referral: Clinical Social Work Discharge Planning Services: CM Consult Post Acute Care Choice: [place] Living arrangements for the past 2 months: [place]                 DME Arranged: N/A DME Agency: NA                   Social Determinants of Health (SDOH) Interventions    Readmission Risk Interventions    10/22/2019   12:04 PM  Readmission Risk Prevention Plan  Transportation Screening Complete  PCP or Specialist Appt within 3-5 Days Not Complete  Not Complete comments pending medical stability  HRI or Home Care Consult Complete  Social Work Consult for [place] Planning/Counseling Complete  Palliative Care Screening Not Applicable  Medication Review Press photographer) Referral to Pharmacy

## 2022-01-20 NOTE — Progress Notes (Signed)
Removed foley. Blood in stool, notified MD

## 2022-01-21 ENCOUNTER — Other Ambulatory Visit: Payer: Self-pay | Admitting: Student

## 2022-01-21 ENCOUNTER — Inpatient Hospital Stay (HOSPITAL_COMMUNITY): Payer: Medicare Other

## 2022-01-21 DIAGNOSIS — R410 Disorientation, unspecified: Secondary | ICD-10-CM | POA: Diagnosis not present

## 2022-01-21 DIAGNOSIS — Z8673 Personal history of transient ischemic attack (TIA), and cerebral infarction without residual deficits: Secondary | ICD-10-CM | POA: Diagnosis not present

## 2022-01-21 DIAGNOSIS — I5033 Acute on chronic diastolic (congestive) heart failure: Secondary | ICD-10-CM | POA: Diagnosis not present

## 2022-01-21 DIAGNOSIS — I4729 Other ventricular tachycardia: Secondary | ICD-10-CM | POA: Diagnosis not present

## 2022-01-21 DIAGNOSIS — I471 Supraventricular tachycardia: Secondary | ICD-10-CM | POA: Diagnosis not present

## 2022-01-21 DIAGNOSIS — I639 Cerebral infarction, unspecified: Secondary | ICD-10-CM

## 2022-01-21 LAB — BASIC METABOLIC PANEL
Anion gap: 6 (ref 5–15)
BUN: 39 mg/dL — ABNORMAL HIGH (ref 8–23)
CO2: 27 mmol/L (ref 22–32)
Calcium: 8.8 mg/dL — ABNORMAL LOW (ref 8.9–10.3)
Chloride: 111 mmol/L (ref 98–111)
Creatinine, Ser: 2.07 mg/dL — ABNORMAL HIGH (ref 0.61–1.24)
GFR, Estimated: 34 mL/min — ABNORMAL LOW (ref >60)
Glucose, Bld: 101 mg/dL — ABNORMAL HIGH (ref 70–99)
Potassium: 2.9 mmol/L — ABNORMAL LOW (ref 3.5–5.1)
Sodium: 144 mmol/L (ref 135–145)

## 2022-01-21 LAB — CBC
HCT: 35.5 % — ABNORMAL LOW (ref 39.0–52.0)
Hemoglobin: 11.5 g/dL — ABNORMAL LOW (ref 13.0–17.0)
MCH: 29.4 pg (ref 26.0–34.0)
MCHC: 32.4 g/dL (ref 30.0–36.0)
MCV: 90.8 fL (ref 80.0–100.0)
Platelets: 546 10*3/uL — ABNORMAL HIGH (ref 150–400)
RBC: 3.91 MIL/uL — ABNORMAL LOW (ref 4.22–5.81)
RDW: 16.3 % — ABNORMAL HIGH (ref 11.5–15.5)
WBC: 9.1 10*3/uL (ref 4.0–10.5)
nRBC: 0 % (ref 0.0–0.2)

## 2022-01-21 MED ORDER — PANTOPRAZOLE SODIUM 40 MG PO TBEC
40.0000 mg | DELAYED_RELEASE_TABLET | Freq: Every day | ORAL | Status: DC
  Administered 2022-01-21 – 2022-01-22 (×2): 40 mg via ORAL
  Filled 2022-01-21 (×2): qty 1

## 2022-01-21 MED ORDER — POTASSIUM CHLORIDE CRYS ER 20 MEQ PO TBCR
40.0000 meq | EXTENDED_RELEASE_TABLET | ORAL | Status: AC
  Administered 2022-01-21 (×2): 40 meq via ORAL
  Filled 2022-01-21 (×2): qty 2

## 2022-01-21 NOTE — Progress Notes (Addendum)
Nutrition Follow-up  DOCUMENTATION CODES:   Obesity unspecified  INTERVENTION:  - continue Ensure BID.   NUTRITION DIAGNOSIS:   Increased nutrient needs related to chronic illness as evidenced by estimated needs. -ongoing  GOAL:   Patient will meet greater than or equal to 90% of their needs -unmet  MONITOR:   PO intake, Supplement acceptance, Labs, Weight trends, I & O's   ASSESSMENT:   (P) 71 y.o. 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  He is noted to be a/o to self only. Patient with hx of CVA with residual expressive aphasia and R-sided weakness.  Patient has been eating 0-25% (mainly 25%) at meals from breakfast on 8/13 through dinner on 8/16. Flow sheet documentation indicates he ate 75% of breakfast today. Ensure ordered BID and he has been accepting supplement 75% of the time offered.  Weight yesterday was -19 lb compared to weight on 8/12. Non-pitting edema to BLE documented in the edema section of flow sheet. Will continue to monitor weight trends.   Labs reviewed; K: 2.9 mmol/l, BUN: 39 mg/dl, creatinine: 2.07 mg/dl, GFR: 34 ml/min.  Medications reviewed; 40 mg IV lasix/day, 2 tablets senokot BID, 17 g miralax/day.   Diet Order:   Diet Order             Diet Heart Room service appropriate? Yes with Assist; Fluid consistency: Thin  Diet effective now                   EDUCATION NEEDS:   No education needs have been identified at this time  Skin:  Skin Assessment: Reviewed RN Assessment  Last BM:  8/16 (type 7 x1, medium amount)  Height:   Ht Readings from Last 1 Encounters:  01/13/22 '6\' 1"'$  (1.854 m)    Weight:   Wt Readings from Last 1 Encounters:  01/20/22 109.2 kg     BMI:   Body mass index is 31.76 kg/m.  Estimated Nutritional Needs:  Kcal:  2000-2200 Protein:  95-105g Fluid:  2L/day      Jarome Matin, MS, RD, LDN, CNSC Registered Dietitian II Inpatient Clinical Nutrition RD pager # and on-call/weekend pager # available in [place]

## 2022-01-21 NOTE — Progress Notes (Signed)
Physical Therapy Treatment Patient Details Name: [name] MRN: [id number] DOB: 1950/12/02 Today's Date: 01/21/2022   History of Present Illness 71 y.o. 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  On presentation, EMS reported temperature of 102.  In the ED, he was tachycardic, tachypneic with mild leukocytosis and creatinine elevated. Dx of acute metabolic encephalopathy, possible sepsis (PNA vs infectious diarrhea vs UTI).    PT Comments    Pt supine in bed with covers and gown off abdomen, reporting abdominal bloating and pain but still concerned for BLE pain and edema, reporting pain RLE>LLE. Pt required mod assist +2 for bed mobility including rolling to complete pericare as pt was incontinent of BM. uring pericare, noted two additional skin tears on proximal posterior thighs, RN notified. Attempted sit to stand transfer with RW and then STEDY, pt demonstrated extensor pattern with posterior lean so despite total assist +2 was not able to complete transfer, discontinued attempts, recommend Hoyer lift at this time.  Discharge destination remains appropriate, we will continue to follow acutely.     Recommendations for follow up therapy are one component of a multi-disciplinary discharge planning process, led by the attending physician.  Recommendations may be updated based on patient status, additional functional criteria and insurance authorization.  Follow Up Recommendations  Skilled nursing-short term rehab (<3 hours/day) Can patient physically be transported by private vehicle: No   Assistance Recommended at Discharge Frequent or constant Supervision/Assistance  Patient can return home with the following Two people to help with  walking and/or transfers;Two people to help with bathing/dressing/bathroom;Assistance with cooking/housework;Direct supervision/assist for medications management;Assist for transportation;Help with stairs or ramp for entrance;Direct supervision/assist for financial management   Equipment Recommendations  None recommended by PT    Recommendations for Other Services       Precautions / Restrictions Precautions Precautions: Fall Precaution Comments: severe knee pain Restrictions Weight Bearing Restrictions: No     Mobility  Bed Mobility Overal bed mobility: Needs Assistance Bed Mobility: Supine to Sit, Sit to Supine, Rolling Rolling: Mod assist, +2 for physical assistance   Supine to sit: HOB elevated, Min assist Sit to supine: +2 for physical assistance, +2 for safety/equipment, Mod assist   General bed mobility comments: Supine to Sit: min guard with Incr time d/t pain. pt is able to self assist with LLE using BUE, with incr time able to self assist with trunk,grimacing in pain. Sit to supine: mod assist +2 for bringing BLE into bed and controlling trunk lowering to bed, pt grimacing in pain. Rolling: pt required mod assist +2 for rolling to L secondary to RUE and RLE weakness. Mod assist to roll R as pt able to increase effort. Pt completed rolling 2x to each side. Pericare completed as pt had BM, further mobility deferred.    Transfers Overall transfer level: Needs assistance Equipment used: Rolling walker (2 wheels), Ambulation equipment used (STEDY) Transfers: Sit to/from Stand Sit to Stand: From elevated surface, Total assist, +2 physical assistance, +2 safety/equipment           General transfer comment: Pt attempted sit to stand transfer total of 5x. 2 attempts with RW from elevated surface were not successful despite total assist +2 for physical assist and multimodal cuing. 3xattempts with STEDY, pt able to lift BLE onto  STEDY platform without assistance, but despite  total assist +2 for physical assist, elevated surface, pt demonstrating posterior lean / extensor pattern of all extremities possibly secondary to pain/fear. Transfer attempts discontinued, pt incontinent of BM so completed rolling for pericare. Further mobility deferred; recommend use of Hoyer lift at this time.    Ambulation/Gait               General Gait Details: unsafe at present   Stairs             Wheelchair Mobility    Modified Rankin (Stroke Patients Only)       Balance                                            Cognition Arousal/Alertness: Awake/alert Behavior During Therapy: WFL for tasks assessed/performed Overall Cognitive Status: No family/caregiver present to determine baseline cognitive functioning                                 General Comments: able to communicate in [place], per RN speaks  speaks [place] and 4-5 other languages, dysarthria, expressive aphasia, some perseveration        Exercises      General Comments        Pertinent Vitals/Pain Pain Assessment Pain Assessment: Faces Faces Pain Scale: Hurts even more Breathing: normal Negative Vocalization: none Facial Expression: smiling or inexpressive Body Language: relaxed Consolability: no need to console PAINAD Score: 0 Pain Location: B feet, L>R Pain Descriptors / Indicators: Grimacing, Guarding Pain Intervention(s): Limited activity within patient's tolerance, Monitored during session, Repositioned    Home Living                          Prior Function            PT Goals (current goals can now be found in the care plan section) Acute Rehab PT Goals PT Goal Formulation: With patient Time For Goal Achievement: 01/29/22 Potential to Achieve Goals: Fair Progress towards PT goals: Not progressing toward goals - comment (Pt unable to transition to OOB therapy.)    Frequency    Min 2X/week      PT Plan Current plan  remains appropriate    Co-evaluation              AM-PAC PT "6 Clicks" Mobility   Outcome Measure  Help needed turning from your back to your side while in a flat bed without using bedrails?: A Lot Help needed moving from lying on your back to sitting on the side of a flat bed without using bedrails?: A Lot Help needed moving to and from a bed to a chair (including a wheelchair)?: Total Help needed standing up from a chair using your arms (e.g., wheelchair or bedside chair)?: Total Help needed to walk in hospital room?: Total Help needed climbing 3-5 steps with a railing? : Total 6 Click Score: 8    End of Session Equipment Utilized During Treatment: Gait belt;Other (comment) (STEDY) Activity Tolerance: Patient limited by pain;Patient limited by fatigue Patient left: in bed;with bed alarm set;with call bell/phone within reach (in chair position) Nurse Communication: Mobility status PT Visit Diagnosis: Difficulty in walking, not elsewhere classified (R26.2);Pain Pain - Right/Left: Right (bil R>L) Pain - part of body:  Leg     Time: 1610-9604 PT Time Calculation (min) (ACUTE ONLY): 31 min  Charges:  $Therapeutic Activity: 23-37 mins                     Coolidge Breeze, PT, DPT [facility] Department Office: [phone] Pager: [phone]   Coolidge Breeze 01/21/2022, 11:39 AM

## 2022-01-21 NOTE — Progress Notes (Signed)
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  On presentation, EMS reported temperature of 102.  In the ED, he was tachycardic, tachypneic with mild leukocytosis and creatinine elevated to 2.82 from prior of 1.96.  CT of abdomen and pelvis showed bibasilar atelectasis with superimposed developing infection/inflammation not excluded with fresh transition state of the colon, no diverticulitis.  He was started on IV fluids and antibiotics.  Blood and urine cultures negative so far.  He required Foley catheter placement for urinary retention  Subjective / 24h Interval events: Complains of abdominal distention today.  No chest pain, no shortness of breath.  Assesement and Plan: Principal Problem:   AMS (altered mental status) Active Problems:   Obstructive uropathy   Essential hypertension   BPH (benign prostatic hyperplasia)   Chronic kidney disease, stage 3b (HCC)   AKI (acute kidney injury) ([place])   Hypokalemia   Acute urinary retention   Aortic root dilatation (HCC)   Chronic heart failure with preserved ejection fraction (HFpEF) (HCC)   Expressive aphasia   Sepsis ([place])   History of CVA (cerebrovascular accident)   Acute metabolic encephalopathy   Leukocytosis   Obesity (BMI 30-39.9)   Physical deconditioning   SVT (supraventricular tachycardia) (HCC)   Acute on chronic diastolic heart failure (HCC)   NSVT (nonsustained ventricular tachycardia) (HCC)   Principal problem SIRS, possible sepsis, POA-unclear etiology, there was questionable bacterial pneumonia, infective  diarrhea and concern for UTI.  He had fever, tachycardia, tachypnea and elevated white count on admission.  Cultures have remained negative so far, GI pathogen panel and C. difficile were not run because diarrhea resolved.  Has completed 7 days of empiric antibiotics, now monitored off.  Culture is still negative  Active problems Acute metabolic encephalopathy-possibly from fever, sepsis.  Improved, suspect close to baseline.  Still has expressive aphasia from prior stroke.  Leukocytosis-WBC normalized  Normocytic anemia-From anemia of chronic disease. Hemoglobin stable. Monitor  Abdominal distention-patient on episode of black stools yesterday.  Hemoglobin has remained stable however.  Not clear of clinical significance.  Still distended this morning, obtain plain x-ray.  Of note, he came to the hospital with abdominal distention and a CT scan of the abdomen pelvis was fairly unremarkable.  Acute kidney injury on CKD stage IIIb -Creatinine 2.83 on presentation; was 1.96 on discharge last month.  Has received IV fluids initially, now discontinued.  Received Lasix 8/14 and 8/15 per cardiology due to fluid overload.  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  Patient possibly has residual aphasia with aphasia and right-sided weakness from prior stroke. MRI of brain during this admission was negative for any stroke. Continue Plavix.  Outpatient follow-up with neurology.   SVT -Patient had an episode of SVT with heart rate going up to 170 on 01/18/2022.  Cardiology has been consulted: Continue metoprolol   Chronic diastolic heart failure -Has slightly swollen lower extremities.  Strict input and output.  Daily weights.  Patient  received Lasix 8/14, 8/15.  Status better.  Continue p.o. 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 Recommend follow-up ultrasound every 3 years   BPH, acute urinary retention -With lower urinary tract symptoms.  Patient required in and out cath few times and subsequently Foley catheter placed on 01/14/2022.  Flomax started on 01/14/2022.  Failed voiding trial 8/16, he will need to be discharged with a Foley catheter and have outpatient urology follow-up   Essential hypertension -Monitor blood pressure.   Physical deconditioning -patient will have to return back to SNF.  TOC following.  Hypokalemia -replace again today   Obesity -Outpatient follow-up   Chronic knee pain -Follows up with orthopedic/sports medicine as an outpatient for recurrent steroid injections.  Outpatient follow-up with orthopedics.  Scheduled Meds:  Chlorhexidine Gluconate Cloth  6 each Topical Daily   clopidogrel  75 mg Oral Daily   enoxaparin (LOVENOX) injection  40 mg Subcutaneous Q24H   feeding supplement  237 mL Oral BID BM   furosemide  40 mg Oral Daily   metoprolol tartrate  25 mg Oral BID   polyethylene glycol  17 g Oral Daily   potassium chloride  40 mEq Oral Q2H   senna-docusate  2 tablet Oral BID   tamsulosin  0.4 mg Oral QPC supper   Continuous Infusions:  sodium chloride 10 mL/hr at 01/18/22 1506   PRN Meds:.sodium chloride, acetaminophen, bisacodyl, iohexol, morphine injection, traMADol  Diet Orders (From admission, onward)     Start     Ordered   01/19/22 1741  Diet Heart Room service appropriate? Yes with Assist; Fluid consistency: Thin  Diet effective now       Question Answer Comment  Room service appropriate? Yes with Assist   Fluid consistency: Thin      01/19/22 1740            DVT prophylaxis: enoxaparin (LOVENOX) injection 40 mg Start: 01/13/22 2230   Lab Results  Component Value Date   PLT 546 (H) 01/21/2022      Code Status: Full  Code  Family Communication: No family at bedside  Status is: Inpatient Remains inpatient appropriate because: Voiding trial today, await cardiology input   Level of care: Telemetry  Consultants:  cards   Objective: Vitals:   01/20/22 1728 01/20/22 1953 01/20/22 2146 01/21/22 0403  BP: 138/76 (!) 125/91 127/88 125/72  Pulse: 87 100 (!) 103 97  Resp: '18 20  19  '$ Temp: 97.9 F (36.6 C) 99.3 F (37.4 C)  99 F (37.2 C)  TempSrc: Oral     SpO2: 96% 96%  95%  Weight:      Height:        Intake/Output Summary (Last 24 hours) at 01/21/2022 1133 Last data filed at 01/21/2022 1026 Gross per 24 hour  Intake 358 ml  Output 1301 ml  Net -943 ml    Wt Readings from Last 3 Encounters:  01/20/22 109.2 kg  12/09/21 117.5 kg  11/30/21 113.6 kg    Examination:  Constitutional: NAD Eyes: lids and conjunctivae normal, no scleral icterus ENMT: mmm Neck: normal, supple Respiratory: clear to auscultation bilaterally, no wheezing, no crackles. Normal respiratory effort.  Cardiovascular: Regular rate and rhythm, no murmurs / rubs / gallops. No LE edema. Abdomen: soft, no distention, no tenderness, mild distention noted. Bowel sounds positive.  Skin: no rashes Neurologic: no focal deficits, equal strength   Data Reviewed: I  have independently reviewed following labs and imaging studies   CBC Recent Labs  Lab 01/16/22 0525 01/17/22 0609 01/18/22 0456 01/19/22 0657 01/20/22 0744 01/21/22 0551  WBC 10.5 13.9* 15.4* 13.2* 10.1 9.1  HGB 12.5* 12.0* 12.1* 11.5* 11.7* 11.5*  HCT 38.2* 36.7* 36.5* 35.1* 35.7* 35.5*  PLT 325 380 429* 506* 550* 546*  MCV 89.7 87.8 88.8 88.2 89.0 90.8  MCH 29.3 28.7 29.4 28.9 29.2 29.4  MCHC 32.7 32.7 33.2 32.8 32.8 32.4  RDW 15.2 15.6* 15.9* 15.9* 16.0* 16.3*  LYMPHSABS 1.9 2.3 2.4 2.0 2.0  --   MONOABS 0.9 1.2* 1.2* 1.0 0.9  --   EOSABS 0.1 0.1 0.1 0.1 0.2  --   BASOSABS 0.0 0.1 0.1 0.1 0.1  --      Recent Labs  Lab 01/15/22 0651  01/16/22 0525 01/17/22 0609 01/18/22 0456 01/19/22 0657 01/20/22 0744 01/21/22 0555  NA 142 144 143 143 143 145 144  K 3.1* 3.4* 3.5 3.3* 3.2* 3.2* 2.9*  CL 109 111 111 110 110 109 111  CO2 '26 24 25 24 25 27 27  '$ GLUCOSE 108* 112* 112* 96 103* 107* 101*  BUN 32* 27* 28* 34* 43* 40* 39*  CREATININE 2.13* 1.84* 1.96* 2.03* 2.17* 2.07* 2.07*  CALCIUM 8.4* 8.5* 8.7* 8.6* 8.7* 8.7* 8.8*  AST 27  --  26  --   --   --   --   ALT 19  --  19  --   --   --   --   ALKPHOS 79  --  86  --   --   --   --   BILITOT 0.9  --  0.8  --   --   --   --   ALBUMIN 2.5*  --  2.3*  --   --   --   --   MG 1.8 1.8 1.9 2.0 1.9 1.9  --   CRP  --   --  34.9*  --   --   --   --   PROCALCITON 0.29 0.32  --   --   --   --   --   BNP  --   --   --   --   --  41.7  --      ------------------------------------------------------------------------------------------------------------------ No results for input(s): "CHOL", "HDL", "LDLCALC", "TRIG", "CHOLHDL", "LDLDIRECT" in the last 72 hours.  Lab Results  Component Value Date   HGBA1C 5.6 12/09/2021   ------------------------------------------------------------------------------------------------------------------ No results for input(s): "TSH", "T4TOTAL", "T3FREE", "THYROIDAB" in the last 72 hours.  Invalid input(s): "FREET3"  Cardiac Enzymes No results for input(s): "CKMB", "TROPONINI", "MYOGLOBIN" in the last 168 hours.  Invalid input(s): "CK" ------------------------------------------------------------------------------------------------------------------    Component Value Date/Time   BNP 41.7 01/20/2022 0744    CBG: No results for input(s): "GLUCAP" in the last 168 hours.  Recent Results (from the past 240 hour(s))  Resp Panel by RT-PCR (Flu A&B, Covid) Anterior Nasal Swab     Status: None   Collection Time: 01/13/22  3:12 PM   Specimen: Anterior Nasal Swab  Result Value Ref Range Status   SARS Coronavirus 2 by RT PCR NEGATIVE NEGATIVE Final     Comment: (NOTE) SARS-CoV-2 target nucleic acids are NOT DETECTED.  The SARS-CoV-2 RNA is generally detectable in upper respiratory specimens during the acute phase of infection. The lowest concentration of SARS-CoV-2 viral copies this assay can detect is 138 copies/mL. A negative result does not preclude SARS-Cov-2 infection and  should not be used as the sole basis for treatment or other patient management decisions. A negative result may occur with  improper specimen collection/handling, submission of specimen other than nasopharyngeal swab, presence of viral mutation(s) within the areas targeted by this assay, and inadequate number of viral copies(<138 copies/mL). A negative result must be combined with clinical observations, patient history, and epidemiological information. The expected result is Negative.  Fact Sheet for Patients:  EntrepreneurPulse.com.au  Fact Sheet for Healthcare Providers:  IncredibleEmployment.be  This test is no t yet approved or cleared by the [place] FDA and  has been authorized for detection and/or diagnosis of SARS-CoV-2 by FDA under an Emergency Use Authorization (EUA). This EUA will remain  in effect (meaning this test can be used) for the duration of the COVID-19 declaration under Section 564(b)(1) of the Act, 21 U.S.C.section 360bbb-3(b)(1), unless the authorization is terminated  or revoked sooner.       Influenza A by PCR NEGATIVE NEGATIVE Final   Influenza B by PCR NEGATIVE NEGATIVE Final    Comment: (NOTE) The Xpert Xpress SARS-CoV-2/FLU/RSV plus assay is intended as an aid in the diagnosis of influenza from Nasopharyngeal swab specimens and should not be used as a sole basis for treatment. Nasal washings and aspirates are unacceptable for Xpert Xpress SARS-CoV-2/FLU/RSV testing.  Fact Sheet for Patients: EntrepreneurPulse.com.au  Fact Sheet for Healthcare  Providers: IncredibleEmployment.be  This test is not yet approved or cleared by the [place] FDA and has been authorized for detection and/or diagnosis of SARS-CoV-2 by FDA under an Emergency Use Authorization (EUA). This EUA will remain in effect (meaning this test can be used) for the duration of the COVID-19 declaration under Section 564(b)(1) of the Act, 21 U.S.C. section 360bbb-3(b)(1), unless the authorization is terminated or revoked.  Performed at [facility], [place] [address] [place], [place] [address]   Blood Culture (routine x 2)     Status: None   Collection Time: 01/13/22  3:12 PM   Specimen: BLOOD  Result Value Ref Range Status   Specimen Description   Final    BLOOD SITE NOT SPECIFIED Performed at [facility] [address], [place], [place] [address]    Special Requests   Final    BOTTLES DRAWN AEROBIC AND ANAEROBIC Blood Culture adequate volume Performed at [facility] [address] [place], [place] [address]    Culture   Final    NO GROWTH 5 DAYS Performed at [facility], [place] [address] [place], [place] [address]    Report Status 01/18/2022 FINAL  Final  Blood Culture (routine x 2)     Status: None   Collection Time: 01/13/22  3:40 PM   Specimen: BLOOD  Result Value Ref Range Status   Specimen Description   Final    BLOOD SITE NOT SPECIFIED Performed at [facility] [address], [place], [place] [address]    Special Requests   Final    BOTTLES DRAWN AEROBIC AND ANAEROBIC Blood Culture results may not be optimal due to an inadequate volume of blood received in culture bottles Performed at [facility] [address] [place], [place] [address]    Culture   Final    NO GROWTH 5 DAYS Performed at [facility], [place] [address], [place],  [address]    Report Status 01/18/2022 FINAL  Final  Urine Culture      Status: None  Collection Time: 01/13/22  6:45 PM   Specimen: In/Out Cath Urine  Result Value Ref Range Status   Specimen Description   Final    IN/OUT CATH URINE Performed at [facility], [place] [address] [place], [place] [address]    Special Requests   Final    NONE Performed at [facility], [place] [address] [place], [place] [address]    Culture   Final    NO GROWTH Performed at [facility], [place] [address] [place], [place] [address]    Report Status 01/15/2022 FINAL  Final  MRSA Next Gen by PCR, Nasal     Status: None   Collection Time: 01/14/22 10:02 AM   Specimen: Nasal Mucosa; Nasal Swab  Result Value Ref Range Status   MRSA by PCR Next Gen NOT DETECTED NOT DETECTED Final    Comment: (NOTE) The GeneXpert MRSA Assay (FDA approved for NASAL specimens only), is one component of a comprehensive MRSA colonization surveillance program. It is not intended to diagnose MRSA infection nor to guide or monitor treatment for MRSA infections. Test performance is not FDA approved in patients less than 76 years old. Performed at [facility], [place] [address] [place], [place] [address]      Radiology Studies: No results found.   Marzetta Board, MD, PhD [facility]  Between 7 am - 7 pm I am available, please contact me via Amion (for emergencies) or Securechat (non urgent messages)  Between 7 pm - 7 am I am not available, please contact night coverage MD/APP via Amion

## 2022-01-21 NOTE — TOC Progression Note (Addendum)
Transition of Care [facility]) - Progression Note    Patient Details  Name: [name] MRN: [id number] Date of Birth: 1950-12-14  Transition of Care [facility]) CM/SW [place], LCSW Phone Number: 01/21/2022, 11:09 AM  Clinical Narrative:    Insurance authorization still pending. Faxed additional records for review.  Update 1245: Pt's insurance authorization has been sent for peer to peer review. Peer to peer deadline is 8/18 at [facility]. Call in number is [phone] option 5.   Update 1350: Pt's insurance denied SNF and state that they feel pt will be able to receive appropriate services from his LTC with intermittent therapy. Pt's family is able to appeal this process. CSW provided pt's son Legrand Como Felixx with appeal information and encouraged him to complete appeal today.    Expected Discharge Plan: Skilled Nursing Facility Barriers to Discharge: Continued Medical Work up  Expected Discharge Plan and Services Expected Discharge Plan: [place] In-house Referral: Clinical Social Work Discharge Planning Services: CM Consult Post Acute Care Choice: [place] Living arrangements for the past 2 months: [place]                 DME Arranged: N/A DME Agency: NA                   Social Determinants of Health (SDOH) Interventions    Readmission Risk Interventions    10/22/2019   12:04 PM  Readmission Risk Prevention Plan  Transportation Screening Complete  PCP or Specialist Appt within 3-5 Days Not Complete  Not Complete comments pending medical stability  HRI or Home Care Consult Complete  Social Work Consult for [place] Planning/Counseling Complete  Palliative Care Screening Not Applicable  Medication Review Press [employer]) Referral to Pharmacy

## 2022-01-21 NOTE — Progress Notes (Signed)
Ordered 30 day Event Monitor for further evaluation of stroke per Dr. Gardiner Rhyme. Please see his rounding note from today for more information.  Darreld Mclean, PA-C 01/21/2022 10:06 PM

## 2022-01-21 NOTE — Progress Notes (Signed)
Progress Note  Patient Name: [name] Date of Encounter: 01/21/2022  [facility] Cardiologist: Mertie Moores, MD   Subjective   Creatinine stable at 2.07. Potassium 2.9, being repleted.  Patient with expressive aphasia but reporting knee and abdomen swelling  Inpatient Medications    Scheduled Meds:  Chlorhexidine Gluconate Cloth  6 each Topical Daily   clopidogrel  75 mg Oral Daily   enoxaparin (LOVENOX) injection  40 mg Subcutaneous Q24H   feeding supplement  237 mL Oral BID BM   furosemide  40 mg Oral Daily   metoprolol tartrate  25 mg Oral BID   polyethylene glycol  17 g Oral Daily   potassium chloride  40 mEq Oral Q2H   senna-docusate  2 tablet Oral BID   tamsulosin  0.4 mg Oral QPC supper   Continuous Infusions:  sodium chloride 10 mL/hr at 01/18/22 1506   PRN Meds: sodium chloride, acetaminophen, bisacodyl, iohexol, morphine injection, traMADol   Vital Signs    Vitals:   01/20/22 1728 01/20/22 1953 01/20/22 2146 01/21/22 0403  BP: 138/76 (!) 125/91 127/88 125/72  Pulse: 87 100 (!) 103 97  Resp: '18 20  19  '$ Temp: 97.9 F (36.6 C) 99.3 F (37.4 C)  99 F (37.2 C)  TempSrc: Oral     SpO2: 96% 96%  95%  Weight:      Height:        Intake/Output Summary (Last 24 hours) at 01/21/2022 1117 Last data filed at 01/21/2022 1026 Gross per 24 hour  Intake 358 ml  Output 1301 ml  Net -943 ml       01/20/2022    5:20 AM 01/19/2022    6:00 AM 01/17/2022    4:00 AM  Last 3 Weights  Weight (lbs) 240 lb 11.9 oz 239 lb 3.2 oz 260 lb 9.3 oz  Weight (kg) 109.2 kg 108.5 kg 118.2 kg      Telemetry    Normal sinus rhythm, NSVT x8 beats- Personally Reviewed  ECG     No new ECG- Personally Reviewed  Physical Exam   GEN: No acute distress.   Neck: Mildly elevated JVD Cardiac: RRR, no murmurs, rubs, or gallops.  Respiratory: Clear to auscultation bilaterally. GI: Soft, nontender, non-distended  MS: Trace edema Neuro:  Expressive aphasia Psych: Unable  to assess  Labs    High Sensitivity Troponin:  No results for input(s): "TROPONINIHS" in the last 720 hours.   Chemistry Recent Labs  Lab 01/15/22 0651 01/16/22 0525 01/17/22 0609 01/18/22 0456 01/19/22 0657 01/20/22 0744 01/21/22 0555  NA 142   < > 143 143 143 145 144  K 3.1*   < > 3.5 3.3* 3.2* 3.2* 2.9*  CL 109   < > 111 110 110 109 111  CO2 26   < > '25 24 25 27 27  '$ GLUCOSE 108*   < > 112* 96 103* 107* 101*  BUN 32*   < > 28* 34* 43* 40* 39*  CREATININE 2.13*   < > 1.96* 2.03* 2.17* 2.07* 2.07*  CALCIUM 8.4*   < > 8.7* 8.6* 8.7* 8.7* 8.8*  MG 1.8   < > 1.9 2.0 1.9 1.9  --   PROT 6.2*  --  6.2*  --   --   --   --   ALBUMIN 2.5*  --  2.3*  --   --   --   --   AST 27  --  26  --   --   --   --  ALT 19  --  19  --   --   --   --   ALKPHOS 79  --  86  --   --   --   --   BILITOT 0.9  --  0.8  --   --   --   --   GFRNONAA 32*   < > 36* 34* 32* 34* 34*  ANIONGAP 7   < > '7 9 8 9 6   '$ < > = values in this interval not displayed.     Lipids No results for input(s): "CHOL", "TRIG", "HDL", "LABVLDL", "LDLCALC", "CHOLHDL" in the last 168 hours.  Hematology Recent Labs  Lab 01/19/22 0657 01/20/22 0744 01/21/22 0551  WBC 13.2* 10.1 9.1  RBC 3.98* 4.01* 3.91*  HGB 11.5* 11.7* 11.5*  HCT 35.1* 35.7* 35.5*  MCV 88.2 89.0 90.8  MCH 28.9 29.2 29.4  MCHC 32.8 32.8 32.4  RDW 15.9* 16.0* 16.3*  PLT 506* 550* 546*    Thyroid No results for input(s): "TSH", "FREET4" in the last 168 hours.  BNP Recent Labs  Lab 01/20/22 0744  BNP 41.7     DDimer No results for input(s): "DDIMER" in the last 168 hours.   Radiology    DG CHEST PORT 1 VIEW  Result Date: 01/19/2022 CLINICAL DATA:  Dyspnea. EXAM: PORTABLE CHEST 1 VIEW COMPARISON:  01/13/2022 FINDINGS: Hypoventilation with mild atelectasis in the lung bases similar to the prior study. No acute infiltrate or effusion. Negative for heart failure. IMPRESSION: Hypoventilation with mild atelectasis in the lung bases. Electronically  Signed   By: Franchot Gallo M.D.   On: 01/19/2022 15:42    Cardiac Studies     Patient Profile     71 y.o. male with a history of aortic insufficiency, chronic diastolic heart failure, CKD, CVA, prostate cancer, hypertension who were consulted for evaluation of heart failure   Assessment & Plan    Acute on chronic diastolic heart failure: Volume overloaded due to IV fluid resuscitation for sepsis.  Received IV Lasix 60 mg on 8/14 and 8/15.  Volume status improved, appears mildly hypovolemic -Continue p.o. Lasix 40 mg daily  SVT: Having brief runs of SVT.  Had been on metoprolol 50 mg twice daily at home.  Restarted metoprolol 25 mg twice daily  CVA: Had recent CVA and has been in rehab and on Plavix per neurology. -Recommend 30 day monitor on discharge to evaluate for Afib  Aortic regurgitation: Moderate AI on recent echo, recommended MRI to quantify aortic regurgitation.  Scheduled for outpatient.  AKI: Creatinine 2.83 on admission, now stable at 2.07 which appears about baseline  NSVT: Maintain K >4, mag >2  [facility] will sign off.   Medication Recommendations:  Lasix 40 mg daily, metoprolol 25 mg twice daily Other recommendations (labs, testing, etc): Plan 30-day monitor on discharge to evaluate A-fib burden.  BMET within 1 week Follow up as an outpatient: Will schedule   For questions or updates, please contact [place] Please consult [email or web address] for contact info under        Signed, Donato Heinz, MD  01/21/2022, 11:17 AM

## 2022-01-22 ENCOUNTER — Inpatient Hospital Stay (HOSPITAL_COMMUNITY): Payer: Medicare Other

## 2022-01-22 DIAGNOSIS — I5033 Acute on chronic diastolic (congestive) heart failure: Secondary | ICD-10-CM | POA: Diagnosis not present

## 2022-01-22 DIAGNOSIS — I471 Supraventricular tachycardia: Secondary | ICD-10-CM

## 2022-01-22 DIAGNOSIS — Z8673 Personal history of transient ischemic attack (TIA), and cerebral infarction without residual deficits: Secondary | ICD-10-CM | POA: Diagnosis not present

## 2022-01-22 DIAGNOSIS — I4729 Other ventricular tachycardia: Secondary | ICD-10-CM | POA: Diagnosis not present

## 2022-01-22 DIAGNOSIS — R400 Somnolence: Secondary | ICD-10-CM | POA: Diagnosis not present

## 2022-01-22 LAB — BASIC METABOLIC PANEL
Anion gap: 7 (ref 5–15)
BUN: 38 mg/dL — ABNORMAL HIGH (ref 8–23)
CO2: 26 mmol/L (ref 22–32)
Calcium: 9.1 mg/dL (ref 8.9–10.3)
Chloride: 112 mmol/L — ABNORMAL HIGH (ref 98–111)
Creatinine, Ser: 2.14 mg/dL — ABNORMAL HIGH (ref 0.61–1.24)
GFR, Estimated: 32 mL/min — ABNORMAL LOW (ref >60)
Glucose, Bld: 100 mg/dL — ABNORMAL HIGH (ref 70–99)
Potassium: 3.7 mmol/L (ref 3.5–5.1)
Sodium: 145 mmol/L (ref 135–145)

## 2022-01-22 LAB — MAGNESIUM: Magnesium: 1.9 mg/dL (ref 1.7–2.4)

## 2022-01-22 LAB — CBC
HCT: 36.7 % — ABNORMAL LOW (ref 39.0–52.0)
Hemoglobin: 11.8 g/dL — ABNORMAL LOW (ref 13.0–17.0)
MCH: 28.8 pg (ref 26.0–34.0)
MCHC: 32.2 g/dL (ref 30.0–36.0)
MCV: 89.5 fL (ref 80.0–100.0)
Platelets: 553 10*3/uL — ABNORMAL HIGH (ref 150–400)
RBC: 4.1 MIL/uL — ABNORMAL LOW (ref 4.22–5.81)
RDW: 16.2 % — ABNORMAL HIGH (ref 11.5–15.5)
WBC: 9.5 10*3/uL (ref 4.0–10.5)
nRBC: 0 % (ref 0.0–0.2)

## 2022-01-22 MED ORDER — PANTOPRAZOLE SODIUM 40 MG IV SOLR
40.0000 mg | Freq: Every day | INTRAVENOUS | Status: DC
  Administered 2022-01-22 – 2022-01-24 (×3): 40 mg via INTRAVENOUS
  Filled 2022-01-22 (×3): qty 10

## 2022-01-22 NOTE — Plan of Care (Signed)
  Problem: Health Behavior/Discharge Planning: Goal: Ability to manage health-related needs will improve Outcome: Progressing   Problem: Clinical Measurements: Goal: Ability to maintain clinical measurements within normal limits will improve Outcome: Progressing Goal: Will remain free from infection Outcome: Progressing Goal: Diagnostic test results will improve Outcome: Progressing Goal: Respiratory complications will improve Outcome: Progressing Goal: Cardiovascular complication will be avoided Outcome: Progressing   Problem: Activity: Goal: Risk for activity intolerance will decrease Outcome: Progressing   Problem: Nutrition: Goal: Adequate nutrition will be maintained Outcome: Progressing   Problem: Coping: Goal: Level of anxiety will decrease Outcome: Progressing   Problem: Pain Managment: Goal: General experience of comfort will improve Outcome: Progressing   Problem: Safety: Goal: Ability to remain free from injury will improve Outcome: Progressing   Problem: Skin Integrity: Goal: Risk for impaired skin integrity will decrease Outcome: Progressing

## 2022-01-22 NOTE — Progress Notes (Signed)
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  On presentation, EMS reported temperature of 102.  In the ED, he was tachycardic, tachypneic with mild leukocytosis and creatinine elevated to 2.82 from prior of 1.96.  CT of abdomen and pelvis showed bibasilar atelectasis with superimposed developing infection/inflammation not excluded with fresh transition state of the colon, no diverticulitis.  He was started on IV fluids and antibiotics.  Blood and urine cultures negative so far.  He required Foley catheter placement for urinary retention  Subjective / 24h Interval events: A lot more distended today and uncomfortable.  Assesement and Plan: Principal Problem:   AMS (altered mental status) Active Problems:   Obstructive uropathy   Essential hypertension   BPH (benign prostatic hyperplasia)   Chronic kidney disease, stage 3b (HCC)   AKI (acute kidney injury) ([place])   Hypokalemia   Acute urinary retention   Aortic root dilatation (HCC)   Chronic heart failure with preserved ejection fraction (HFpEF) (HCC)   Expressive aphasia   Sepsis ([place])   History of CVA (cerebrovascular accident)   Acute metabolic encephalopathy   Leukocytosis   Obesity (BMI 30-39.9)   Physical deconditioning   SVT (supraventricular tachycardia) (HCC)   Acute on chronic diastolic heart failure (HCC)   NSVT (nonsustained ventricular tachycardia) (HCC)   Principal problem SIRS, possible sepsis, POA-unclear etiology, there was questionable bacterial pneumonia, infective diarrhea and concern for UTI.  He had  fever, tachycardia, tachypnea and elevated white count on admission.  Cultures have remained negative so far, GI pathogen panel and C. difficile were not run because diarrhea resolved.  Has completed 7 days of empiric antibiotics, now monitored off.  Culture is still negative  Active problems Acute metabolic encephalopathy-possibly from fever, sepsis.  Improved, suspect close to baseline.  Still has expressive aphasia from prior stroke.  Leukocytosis-WBC normalized  Normocytic anemia-From anemia of chronic disease. Hemoglobin stable. Monitor  Abdominal distention-worsening distention today.  Obtain a CT scan of the abdomen.  Supportive care  Acute kidney injury on CKD stage IIIb -Creatinine 2.83 on presentation; was 1.96 on discharge last month.  Has received IV fluids initially, now discontinued.  Received Lasix 8/14 and 8/15 per cardiology due to fluid overload.  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  Patient possibly has residual aphasia with aphasia and right-sided weakness from prior stroke. MRI of brain during this admission was negative for any stroke. Continue Plavix.  Outpatient follow-up with neurology.   SVT -Patient had an episode of SVT with heart rate going up to 170 on 01/18/2022.  Cardiology has been consulted: Continue metoprolol   Chronic diastolic heart failure -Has slightly swollen lower extremities.  Strict input and output.  Daily weights.  Patient received Lasix 8/14, 8/15.  Status better.  Continue p.o. 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 Recommend follow-up ultrasound every 3 years   BPH, acute urinary retention -With lower  urinary tract symptoms.  Patient required in and out cath  few times and subsequently Foley catheter placed on 01/14/2022.  Flomax started on 01/14/2022.  Failed voiding trial 8/16, he will need to be discharged with a Foley catheter and have outpatient urology follow-up   Essential hypertension -Monitor blood pressure.   Physical deconditioning -patient will have to return back to SNF.  TOC following.  Hypokalemia -replace again today   Obesity -Outpatient follow-up   Chronic knee pain -Follows up with orthopedic/sports medicine as an outpatient for recurrent steroid injections.  Outpatient follow-up with orthopedics.  Scheduled Meds:  Chlorhexidine Gluconate Cloth  6 each Topical Daily   clopidogrel  75 mg Oral Daily   enoxaparin (LOVENOX) injection  40 mg Subcutaneous Q24H   feeding supplement  237 mL Oral BID BM   furosemide  40 mg Oral Daily   metoprolol tartrate  25 mg Oral BID   pantoprazole  40 mg Oral Daily   polyethylene glycol  17 g Oral Daily   senna-docusate  2 tablet Oral BID   tamsulosin  0.4 mg Oral QPC supper   Continuous Infusions:  sodium chloride 10 mL/hr at 01/18/22 1506   PRN Meds:.sodium chloride, acetaminophen, bisacodyl, iohexol, morphine injection, traMADol  Diet Orders (From admission, onward)     Start     Ordered   01/22/22 0141  Diet clear liquid Room service appropriate? Yes; Fluid consistency: Thin  Diet effective now       Question Answer Comment  Room service appropriate? Yes   Fluid consistency: Thin      01/22/22 0140            DVT prophylaxis: enoxaparin (LOVENOX) injection 40 mg Start: 01/13/22 2230   Lab Results  Component Value Date   PLT 546 (H) 01/21/2022      Code Status: Full Code  Family Communication: No family at bedside  Status is: Inpatient Remains inpatient appropriate because: Worsening abdominal distention   Level of care: Telemetry  Consultants:  cards   Objective: Vitals:   01/21/22 1215 01/21/22 2035 01/22/22 0500 01/22/22 0542  BP: 129/85 134/87   124/62  Pulse: 92 89  83  Resp: (!) '22 20  20  '$ Temp: 98.6 F (37 C) 98.6 F (37 C)  98 F (36.7 C)  TempSrc: Oral   Oral  SpO2: 99% 96%  97%  Weight:   108.3 kg   Height:        Intake/Output Summary (Last 24 hours) at 01/22/2022 1228 Last data filed at 01/22/2022 1225 Gross per 24 hour  Intake 240 ml  Output 1500 ml  Net -1260 ml    Wt Readings from Last 3 Encounters:  01/22/22 108.3 kg  12/09/21 117.5 kg  11/30/21 113.6 kg    Examination:  Constitutional: NAD Eyes: lids and conjunctivae normal, no scleral icterus ENMT: mmm Neck: normal, supple Respiratory: clear to auscultation bilaterally, no wheezing, no crackles. Normal respiratory effort.  Cardiovascular: Regular rate and rhythm, no murmurs / rubs / gallops. No LE edema. Abdomen: soft, distended, no tenderness. Bowel sounds positive.  Skin: no rashes Neurologic: no focal deficits, equal strength  Data Reviewed: I have independently reviewed following labs and imaging studies   CBC Recent Labs  Lab 01/16/22 0525 01/17/22 0609 01/18/22 0456 01/19/22 0657 01/20/22 0744 01/21/22 0551  WBC 10.5 13.9* 15.4* 13.2* 10.1 9.1  HGB 12.5* 12.0* 12.1* 11.5* 11.7* 11.5*  HCT 38.2* 36.7* 36.5* 35.1* 35.7* 35.5*  PLT 325 380 429* 506*  550* 546*  MCV 89.7 87.8 88.8 88.2 89.0 90.8  MCH 29.3 28.7 29.4 28.9 29.2 29.4  MCHC 32.7 32.7 33.2 32.8 32.8 32.4  RDW 15.2 15.6* 15.9* 15.9* 16.0* 16.3*  LYMPHSABS 1.9 2.3 2.4 2.0 2.0  --   MONOABS 0.9 1.2* 1.2* 1.0 0.9  --   EOSABS 0.1 0.1 0.1 0.1 0.2  --   BASOSABS 0.0 0.1 0.1 0.1 0.1  --      Recent Labs  Lab 01/16/22 0525 01/17/22 0609 01/18/22 0456 01/19/22 0657 01/20/22 0744 01/21/22 0555  NA 144 143 143 143 145 144  K 3.4* 3.5 3.3* 3.2* 3.2* 2.9*  CL 111 111 110 110 109 111  CO2 '24 25 24 25 27 27  '$ GLUCOSE 112* 112* 96 103* 107* 101*  BUN 27* 28* 34* 43* 40* 39*  CREATININE 1.84* 1.96* 2.03* 2.17* 2.07* 2.07*  CALCIUM 8.5* 8.7* 8.6* 8.7* 8.7* 8.8*  AST  --   26  --   --   --   --   ALT  --  19  --   --   --   --   ALKPHOS  --  86  --   --   --   --   BILITOT  --  0.8  --   --   --   --   ALBUMIN  --  2.3*  --   --   --   --   MG 1.8 1.9 2.0 1.9 1.9  --   CRP  --  34.9*  --   --   --   --   PROCALCITON 0.32  --   --   --   --   --   BNP  --   --   --   --  41.7  --      ------------------------------------------------------------------------------------------------------------------ No results for input(s): "CHOL", "HDL", "LDLCALC", "TRIG", "CHOLHDL", "LDLDIRECT" in the last 72 hours.  Lab Results  Component Value Date   HGBA1C 5.6 12/09/2021   ------------------------------------------------------------------------------------------------------------------ No results for input(s): "TSH", "T4TOTAL", "T3FREE", "THYROIDAB" in the last 72 hours.  Invalid input(s): "FREET3"  Cardiac Enzymes No results for input(s): "CKMB", "TROPONINI", "MYOGLOBIN" in the last 168 hours.  Invalid input(s): "CK" ------------------------------------------------------------------------------------------------------------------    Component Value Date/Time   BNP 41.7 01/20/2022 0744    CBG: No results for input(s): "GLUCAP" in the last 168 hours.  Recent Results (from the past 240 hour(s))  Resp Panel by RT-PCR (Flu A&B, Covid) Anterior Nasal Swab     Status: None   Collection Time: 01/13/22  3:12 PM   Specimen: Anterior Nasal Swab  Result Value Ref Range Status   SARS Coronavirus 2 by RT PCR NEGATIVE NEGATIVE Final    Comment: (NOTE) SARS-CoV-2 target nucleic acids are NOT DETECTED.  The SARS-CoV-2 RNA is generally detectable in upper respiratory specimens during the acute phase of infection. The lowest concentration of SARS-CoV-2 viral copies this assay can detect is 138 copies/mL. A negative result does not preclude SARS-Cov-2 infection and should not be used as the sole basis for treatment or other patient management decisions. A negative  result may occur with  improper specimen collection/handling, submission of specimen other than nasopharyngeal swab, presence of viral mutation(s) within the areas targeted by this assay, and inadequate number of viral copies(<138 copies/mL). A negative result must be combined with clinical observations, patient history, and epidemiological information. The expected result is Negative.  Fact Sheet for Patients:  EntrepreneurPulse.com.au  Fact Sheet for  Healthcare Providers:  IncredibleEmployment.be  This test is no t yet approved or cleared by the [place] and  has been authorized for detection and/or diagnosis of SARS-CoV-2 by FDA under an Emergency Use Authorization (EUA). This EUA will remain  in effect (meaning this test can be used) for the duration of the COVID-19 declaration under Section 564(b)(1) of the Act, 21 U.S.C.section 360bbb-3(b)(1), unless the authorization is terminated  or revoked sooner.       Influenza A by PCR NEGATIVE NEGATIVE Final   Influenza B by PCR NEGATIVE NEGATIVE Final    Comment: (NOTE) The Xpert Xpress SARS-CoV-2/FLU/RSV plus assay is intended as an aid in the diagnosis of influenza from Nasopharyngeal swab specimens and should not be used as a sole basis for treatment. Nasal washings and aspirates are unacceptable for Xpert Xpress SARS-CoV-2/FLU/RSV testing.  Fact Sheet for Patients: EntrepreneurPulse.com.au  Fact Sheet for Healthcare Providers: IncredibleEmployment.be  This test is not yet approved or cleared by the [place] FDA and has been authorized for detection and/or diagnosis of SARS-CoV-2 by FDA under an Emergency Use Authorization (EUA). This EUA will remain in effect (meaning this test can be used) for the duration of the COVID-19 declaration under Section 564(b)(1) of the Act, 21 U.S.C. section 360bbb-3(b)(1), unless the authorization is  terminated or revoked.  Performed at [facility], [place] [address] [place], [place] [address]   Blood Culture (routine x 2)     Status: None   Collection Time: 01/13/22  3:12 PM   Specimen: BLOOD  Result Value Ref Range Status   Specimen Description   Final    BLOOD SITE NOT SPECIFIED Performed at [place] [address], [place], [place] [address]    Special Requests   Final    BOTTLES DRAWN AEROBIC AND ANAEROBIC Blood Culture adequate volume Performed at [place] [address] [place], [place] [address]    Culture   Final    NO GROWTH 5 DAYS Performed at [facility], [place] [address] [place], [place] [address]    Report Status 01/18/2022 FINAL  Final  Blood Culture (routine x 2)     Status: None   Collection Time: 01/13/22  3:40 PM   Specimen: BLOOD  Result Value Ref Range Status   Specimen Description   Final    BLOOD SITE NOT SPECIFIED Performed at [facility] [address] [place], [place] [address]    Special Requests   Final    BOTTLES DRAWN AEROBIC AND ANAEROBIC Blood Culture results may not be optimal due to an inadequate volume of blood received in culture bottles Performed at [facility] [address], [place], [place] [address]    Culture   Final    NO GROWTH 5 DAYS Performed at [facility], [place] [address] [place], [place] [address]    Report Status 01/18/2022 FINAL  Final  Urine Culture     Status: None   Collection Time: 01/13/22  6:45 PM   Specimen: In/Out Cath Urine  Result Value Ref Range Status   Specimen Description   Final    IN/OUT CATH URINE Performed at [facility] [address], [place], [place] [address]    Special Requests   Final    NONE Performed at [facility], [place] [address], [place],  [address]    Culture   Final    NO GROWTH  Performed at [place], [place] [address], [place], [place] [address]    Report Status 01/15/2022 FINAL  Final  MRSA Next Gen by PCR, Nasal     Status: None   Collection Time: 01/14/22 10:02 AM   Specimen: Nasal Mucosa; Nasal Swab  Result Value Ref Range Status   MRSA by PCR Next Gen NOT DETECTED NOT DETECTED Final    Comment: (NOTE) The GeneXpert MRSA Assay (FDA approved for NASAL specimens only), is one component of a comprehensive MRSA colonization surveillance program. It is not intended to diagnose MRSA infection nor to guide or monitor treatment for MRSA infections. Test performance is not FDA approved in patients less than 73 years old. Performed at [facility], [place] [address], [place], [place] [address]      Radiology Studies: No results found.   Marzetta Board, MD, PhD [facility]  Between 7 am - 7 pm I am available, please contact me via Amion (for emergencies) or Securechat (non urgent messages)  Between 7 pm - 7 am I am not available, please contact night coverage MD/APP via Amion

## 2022-01-22 NOTE — Consult Note (Addendum)
Referring Provider: Dr. Darrow Bussing Primary Care Physician:  Biagio Borg, MD Primary Gastroenterologist:  Dr. Silvano Rusk   Reason for Consultation: GI bleed, sigmoid mass   HPI: [name] is a 71 y.o. 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   He was admitted to the hospital 12/08/2021 with dysphagia and right-sided weakness.  He was diagnosed with acute left MCA branch CVA per brain MRI. Neurology was consulted with recommendations for 30-day cardiac monitoring due to a high suspicion for embolic etiology.  No atrial fibrillation was noted throughout his hospital course.  He was discharged to rehab 12/12/2021 on Plavix and aspirin for 3 weeks then only Plavix.  He was readmitted to the hospital 01/13/2022 with a fever of 102F, altered mental status and abdominal pain. Infectious process of unclear etiology. WBC 13.7. AKI with Cr 2.82 up from 1.96.  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 Blood and urine cultures were negative.  He was treated empirically with IV antibiotics.  He developed SVT which stabilized on Metoprolol per cardiology.  He had 1 episode of melena 2 days ago and he was placed on PPI. His hemoglobin level remained stable.  Today,  he complained of significant abdominal distension with generalized abdominal pain then passed a dark stool with red blood in it.  CTAP without contrast showed a mildly distended air-filled colon and rectum with moderate wall thickening to the proximal sigmoid colon which may represent focal inflammation but neoplastic process could not be  excluded.  A GI consult was requested for further evaluation regarding GI bleed/melenic stool  and red blood per the rectum in the setting of abdominal distention and possible sigmoid mass.  Labs today: WBC 9.5.  Hemoglobin 11.8. Hematocrit 36.7.  Platelet 553.  Labs 01/21/2022: Hemoglobin 11.7.  Hematocrit 35.7.  Sodium 144.  Potassium 2.9.  Glucose 101.  BUN 39.  Creatinine 2.07.  Calcium 8.8.  Labs 01/17/2022: BUN 28.  Creatinine 1.96.  Total bili 0.8.  Alk phos 86.  AST 26.  ALT 19.  Last dose of Plavix was given at 10 am on 01/21/2022  He has expressive aphasia therefore he is unable to accurately provide any pertinent details regarding current symptoms or past medical history.  No family at the bedside.  I attempted to call his contact relative Shanon Brow Addae at [phone] but he did not answer.  He was last seen by Dr. Carlean Purl in our GI clinic 09/03/2020 due to having chronic constipation and a fecal impaction 07/2020.  He was started on lactulose daily and Dulcolax as needed.  Linzess was not affordable.   PAST GI PROCEDURES:   Colonoscopy 08/22/2014: 1. There was mild diverticulosis noted throughout the entire examined colon 2. Internal hemorrhoids 3. The colon was redundant 4. The examination was otherwise normal -Bowel prep was good -10-year recall colonoscopy  Colonoscopy 11/30/2006: Scattered diverticulosis throughout the colon   ECHO 12/18/2021: Left ventricular ejection fraction, by estimation, is 55 to 60%. The left ventricle has normal function. The left ventricle has no regional wall motion abnormalities. There is mild left ventricular hypertrophy. Left ventricular diastolic parameters are indeterminate. 1. Right ventricular systolic function is normal. The right ventricular size is  normal. There is mildly elevated pulmonary artery systolic pressure. The estimated right ventricular systolic pressure is 25.9 mmHg. 2. 3. The mitral valve is normal in structure. Trivial  mitral valve regurgitation. The aortic valve was not well visualized. Aortic valve regurgitation is moderate. Aortic valve sclerosis/calcification is present, without any evidence of aortic stenosis. 4. Aortic dilatation noted. 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 There is dilatation of the ascending aorta, measuring 40 mm. 5. The inferior vena cava is dilated in size with <50% respiratory variability, suggesting right atrial pressure of 15 mmHg.  Past Medical History:  Diagnosis Date   Anemia    normal Fe, nl B12, nl retic, nl EPO July '13   Aortic regurgitation    Moderate AI 04/17/19 echo   Blood transfusion without reported diagnosis    BPH (benign prostatic hyperplasia)    Chronic back pain    Chronic kidney disease    CKD III, obstructive nephropathy   Diverticulosis    Dysuria    Elevated PSA, greater than or equal to 20 ng/ml June '13   PSA 107   Hemorrhoids, internal, with bleeding, prolapse 09/19/2014   Hyperlipidemia 05/29/2014   Hypertension    Obstructive uropathy 11/27/2015   Pre-diabetes    per patient "reduced sugar intake"   Renal insufficiency    Tuberculosis    h/o PPD +   UTI (urinary tract infection)    Vitamin D deficiency 09/13/2017    Past Surgical History:  Procedure Laterality Date   CARPAL TUNNEL RELEASE Right    COLONOSCOPY     HEMORRHOID BANDING     INSERTION OF MESH N/A 10/17/2019   Procedure: Insertion Of Mesh;  Surgeon: Ralene Ok, MD;  Location: [facility];  Service: General;  Laterality: N/A;   SPLENECTOMY     XI ROBOTIC ASSISTED SIMPLE PROSTATECTOMY N/A 03/09/2019   Procedure: XI ROBOTIC ASSISTED SIMPLE PROSTATECTOMY;  Surgeon: Cleon Gustin, MD;  Location: [facility];  Service: Urology;  Laterality: N/A;   XI ROBOTIC ASSISTED VENTRAL HERNIA N/A 10/17/2019   Procedure: XI ROBOTIC ASSISTED INCISIONAL HERNIA REPAIR WITH MESH;  Surgeon: Ralene Ok, MD;  Location: [facility];  Service: General;  Laterality: N/A;     Prior to Admission medications   Medication Sig Start Date End Date Taking? Authorizing Provider  acetaminophen (TYLENOL) 500 MG tablet Take 1,000 mg by mouth every 6 (six) hours as needed (Reason not listed on MAR).   Yes [provider]  amLODipine (NORVASC) 10 MG tablet Take 1 tablet (10 mg total) by mouth daily. 12/10/20  Yes Biagio Borg, MD  atorvastatin (LIPITOR) 80 MG tablet Take 1 tablet (80 mg total) by mouth daily. Patient taking differently: Take 80 mg by mouth at bedtime. 12/12/21  Yes Patrecia Pour, MD  cholecalciferol (VITAMIN D3) 25 MCG (1000 UNIT) tablet Take 1,000 Units by mouth daily.   Yes [provider]  clopidogrel (PLAVIX) 75 MG tablet Take 1 tablet (75 mg total) by mouth daily. 12/13/21  Yes Patrecia Pour, MD  hydrALAZINE (APRESOLINE) 10 MG tablet Take 10 mg by mouth 2 (two) times daily. 01/08/22  Yes [provider]  lactulose (CHRONULAC) 10 GM/15ML solution TAKE 15 TO 30 MLS BY MOUTH ONCE DAILY Patient taking differently: Take 10-20 g by mouth daily. 07/31/21  Yes Gatha Mayer, MD  metoprolol tartrate (LOPRESSOR) 50 MG tablet Take 1 tablet by mouth twice daily Patient taking differently: Take 50 mg by mouth 2 (two) times  daily. 08/04/21  Yes Biagio Borg, MD  Multiple Vitamin (MULTIVITAMIN WITH MINERALS) TABS tablet Take 1 tablet by mouth daily.   Yes [provider]  polyvinyl alcohol (ARTIFICIAL TEARS) 1.4 % ophthalmic solution Place 2 drops into both eyes as needed (Reason not listed on MAR).   Yes [provider]  potassium chloride (KLOR-CON) 10 MEQ tablet Take 10 mEq by mouth See admin instructions. Take 10 mEq once daily on Monday, Wednesday, and Friday 01/07/22  Yes [provider]  predniSONE (DELTASONE) 20 MG tablet Take 2 tablets (40 mg total) by mouth daily with breakfast. Patient taking differently: Take 20 mg by mouth daily. 01/11/22-01/17/22 12/13/21  Yes Patrecia Pour, MD  tamsulosin (FLOMAX) 0.4 MG CAPS  capsule Take 0.4 mg by mouth daily. 01/08/22  Yes [provider]  allopurinol (ZYLOPRIM) 100 MG tablet Take 1 tablet (100 mg total) by mouth daily. 01/19/22   Aline August, MD  Allopurinol 200 MG TABS Take 200 mg by mouth daily. Patient not taking: Reported on 01/14/2022    [provider]  aspirin EC 81 MG tablet Take 81 mg by mouth daily. For 21 days Patient not taking: Reported on 01/14/2022    [provider]  bisacodyl (DULCOLAX) 10 MG suppository Place 10 mg rectally once. Patient not taking: Reported on 01/14/2022    [provider]  colchicine 0.6 MG tablet Take 1 tablet (0.6 mg total) by mouth daily. Patient not taking: Reported on 01/14/2022 12/10/20   Biagio Borg, MD  hydrALAZINE (APRESOLINE) 25 MG tablet Take 25 mg by mouth once. Patient not taking: Reported on 01/14/2022    [provider]  hydroxypropyl methylcellulose / hypromellose (ISOPTO TEARS / GONIOVISC) 2.5 % ophthalmic solution Place 1-2 drops into both eyes 3 (three) times daily as needed (dry/irritated eyes.). Patient not taking: Reported on 01/14/2022 11/12/19   Biagio Borg, MD  meclizine (ANTIVERT) 25 MG tablet Take 1 tablet (25 mg total) by mouth 2 (two) times daily as needed for up to 20 doses for dizziness. Patient not taking: Reported on 01/14/2022 05/22/20   Lennice Sites, DO  polyethylene glycol (MIRALAX / GLYCOLAX) 17 g packet Take 17 g by mouth daily. 01/20/22   Aline August, MD  Potassium 99 MG TABS Take 99 mg by mouth daily.  Patient not taking: Reported on 01/14/2022    [provider]  senna-docusate (SENOKOT-S) 8.6-50 MG tablet Take 2 tablets by mouth 2 (two) times daily. 01/19/22   Aline August, MD  traMADol (ULTRAM) 50 MG tablet Take 1 tablet (50 mg total) by mouth every 12 (twelve) hours as needed for severe pain. 01/19/22   Aline August, MD    Current Facility-Administered Medications  Medication Dose Route Frequency Provider Last Rate Last Admin    0.9 %  sodium chloride infusion   Intravenous PRN Aline August, MD 10 mL/hr at 01/18/22 1506 Infusion Verify at 01/18/22 1506   acetaminophen (TYLENOL) tablet 650 mg  650 mg Oral Q6H PRN Aline August, MD   650 mg at 01/19/22 1455   bisacodyl (DULCOLAX) suppository 10 mg  10 mg Rectal Daily PRN Aline August, MD       Chlorhexidine Gluconate Cloth 2 % PADS 6 each  6 each Topical Daily Aline August, MD   6 each at 01/22/22 0849   enoxaparin (LOVENOX) injection 40 mg  40 mg Subcutaneous Q24H Tu, Ching T, DO   40 mg at 01/21/22 2114   feeding supplement (ENSURE ENLIVE /  ENSURE PLUS) liquid 237 mL  237 mL Oral BID BM Alekh, Kshitiz, MD   237 mL at 01/21/22 1412   furosemide (LASIX) tablet 40 mg  40 mg Oral Daily Donato Heinz, MD   40 mg at 01/21/22 1017   iohexol (OMNIPAQUE) 300 MG/ML solution 100 mL  100 mL Intravenous Once PRN Isla Pence, MD       metoprolol tartrate (LOPRESSOR) tablet 25 mg  25 mg Oral BID Isaiah Serge, NP   25 mg at 01/21/22 2114   morphine (PF) 2 MG/ML injection 2 mg  2 mg Intravenous Q2H PRN Aline August, MD   2 mg at 01/22/22 0158   pantoprazole (PROTONIX) EC tablet 40 mg  40 mg Oral Daily Caren Griffins, MD   40 mg at 01/21/22 1410   polyethylene glycol (MIRALAX / GLYCOLAX) packet 17 g  17 g Oral Daily Aline August, MD   17 g at 01/21/22 1017   senna-docusate (Senokot-S) tablet 2 tablet  2 tablet Oral BID Aline August, MD   2 tablet at 01/21/22 2115   tamsulosin (FLOMAX) capsule 0.4 mg  0.4 mg Oral QPC supper Aline August, MD   0.4 mg at 01/21/22 2018   traMADol (ULTRAM) tablet 50 mg  50 mg Oral Q12H PRN Aline August, MD   50 mg at 01/20/22 0853    Allergies as of 01/13/2022 - Review Complete 01/13/2022  Allergen Reaction Noted   Beef-derived products  01/13/2022   Chicken meat (diagnostic)  01/13/2022    Family History  Problem Relation Age of Onset   Diabetes Mother    Stroke Brother    Hearing loss Brother    Colon cancer Neg  Hx    Rectal cancer Neg Hx    Stomach cancer Neg Hx    Esophageal cancer Neg Hx     Social History   Socioeconomic History   Marital status: Single    Spouse name: Not on file   Number of children: 2   Years of education: 20   Highest education level: Some college, no degree  Occupational History   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 . To [place] '78 - finished College, all but Arts administrator. Married - wife in [place], blocked from [place] to [place]. 1 son '90; 1 dtr '93. 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 Resources depleted.   Right handed.   Caffeine Hot daily one daily.   Social Determinants of Health   Financial Resource Strain: Low Risk  (08/10/2021)   Overall Financial Resource Strain (CARDIA)    Difficulty of Paying Living Expenses: Not hard at all  Food Insecurity: No Food Insecurity (08/10/2021)   Hunger Vital Sign    Worried About Running Out of Food in the Last Year: Never true    Ran Out of Food in the Last Year: Never true  Transportation Needs: No Transportation  Needs (12/02/2021)   PRAPARE - Hydrologist (Medical): No    Lack of Transportation (Non-Medical): No  Physical Activity: Sufficiently Active (08/10/2021)   Exercise Vital Sign    Days of Exercise per Week: 5 days    Minutes of Exercise per Session: 30 min  Stress: No Stress Concern Present (08/10/2021)   Superior    Feeling of Stress : Not at all  Social Connections: Unknown (08/10/2021)   Social Connection and Isolation Panel [NHANES]    Frequency of Communication with Friends and Family: More than three times a week    Frequency of Social Gatherings with Friends and Family: Once a week    Attends Religious Services: Never    Marine scientist or Organizations: No    Attends Archivist Meetings: Never    Marital Status: Not on file  Intimate Partner Violence: Not At Risk (08/10/2021)   Humiliation, Afraid, Rape, and Kick questionnaire    Fear of Current or Ex-Partner: No    Emotionally Abused: No    Physically Abused: No    Sexually Abused: No    Review of Systems: Patient has expressive dysphagia which significantly limits his response to questions therefore review of systems is incomplete.  Physical Exam: Vital signs in last 24 hours: Temp:  [97.6 F (36.4 C)-98.6 F (37 C)] 97.6 F (36.4 C) (08/18 1229) Pulse Rate:  [83-95] 95 (08/18 1229) Resp:  [18-20] 18 (08/18 1229) BP: (124-137)/(62-92) 137/92 (08/18 1229) SpO2:  [96 %-97 %] 96 % (08/18 1229) Weight:  [108.3 kg] 108.3 kg (08/18 0500) Last BM Date : 01/22/22 General:  Alert 71 year old male in no acute distress. Head:  Normocephalic and atraumatic. Eyes:  No scleral icterus. Conjunctiva pink. Ears:  Normal auditory acuity. Nose:  No deformity, discharge or lesions. Mouth:  Dentition intact. No ulcers or lesions.  Neck:  Supple. No lymphadenopathy or thyromegaly.  Lungs: Sounds clear, decreased in the bases bilaterally. Heart: Regular rate and rhythm, no murmurs. Abdomen: Distended. Gurgling bowel sounds x 4 quads. Generalized abdominal tenderness rebound or guarding.  Midline abdominal scar intact. Rectal: Deferred. Musculoskeletal:  Symmetrical without gross deformities.  Pulses:  Normal pulses noted. Extremities: Bilateral lower extremities with 2+ edema. Neurologic:  Alert to name. Moves all  extremities with R weaker than left.  Skin:  Intact without significant lesions or rashes. Psych:  Alert and cooperative. Normal mood and affect.  Intake/Output from previous day: 08/17 0701 - 08/18 0700 In: 358 [P.O.:358] Out: 1200 [Urine:1200] Intake/Output this shift: Total I/O In: -  Out: 300 [Urine:300]  Lab Results: Recent Labs    01/20/22 0744 01/21/22 0551  WBC 10.1 9.1  HGB 11.7* 11.5*  HCT 35.7* 35.5*  PLT 550* 546*   BMET Recent Labs    01/20/22 0744 01/21/22 0555  NA 145 144  K 3.2* 2.9*  CL 109 111  CO2 27 27  GLUCOSE 107* 101*  BUN 40* 39*  CREATININE 2.07* 2.07*  CALCIUM 8.7* 8.8*   LFT No results for input(s): "PROT", "ALBUMIN", "AST", "ALT", "ALKPHOS", "BILITOT", "BILIDIR", "IBILI" in the last 72 hours. PT/INR No results for input(s): "LABPROT", "INR" in the last 72 hours. Hepatitis Panel No results for input(s): "HEPBSAG", "HCVAB", "HEPAIGM", "HEPBIGM" in the last 72 hours.    Studies/Results: CT ABDOMEN PELVIS WO CONTRAST  Result Date: 01/22/2022 CLINICAL DATA:  Abdominal pain. EXAM: CT ABDOMEN AND PELVIS WITHOUT CONTRAST TECHNIQUE: Multidetector  CT imaging of the abdomen and pelvis was performed following the standard protocol without IV contrast. RADIATION DOSE REDUCTION: This exam was performed according to the departmental dose-optimization program which includes automated exposure control, adjustment of the mA and/or kV according to patient size and/or use of iterative reconstruction technique. COMPARISON:  January 13, 2022. FINDINGS: Lower chest: No acute abnormality. Hepatobiliary: No focal liver abnormality is seen. No gallstones, gallbladder wall thickening, or biliary dilatation. Pancreas: Unremarkable. No pancreatic ductal dilatation or surrounding inflammatory changes. Spleen: Status post splenectomy. Adrenals/Urinary Tract: Adrenal glands appear normal. Severe right renal atrophy is noted. Stable left renal cysts are noted. No further  follow-up is required. No hydronephrosis or renal obstruction is noted. Urinary bladder is decompressed secondary to Foley catheter. Stomach/Bowel: Stomach is unremarkable. The appendix is unremarkable. No small bowel dilatation is noted. Mildly distended and air-filled colon is noted. There is moderate wall thickening of the proximal sigmoid colon which may represent focal inflammation, but neoplasm cannot be excluded. Vascular/Lymphatic: Aortic atherosclerosis. No enlarged abdominal or pelvic lymph nodes. Reproductive: Stable mild prostatic enlargement. Other: No ascites is noted. Postsurgical changes are seen involving the anterior abdominal wall consistent with prior hernia repair. Small fat containing periumbilical hernia is noted. Musculoskeletal: No acute or significant osseous findings. IMPRESSION: Mildly distended and air-filled colon is noted as well as the rectum. There is noted moderate wall thickening of the proximal sigmoid colon which may represent focal inflammation, but neoplasm cannot be excluded. Sigmoidoscopy is recommended for further evaluation. Severe right renal atrophy is noted. Stable left renal cysts are noted for which no further follow-up is required. Hydronephrosis or renal obstruction is noted. Urinary bladder is decompressed secondary to Foley catheter. Status post splenectomy. Surgical changes are seen involving anterior abdominal wall consistent with prior hernia repair. Aortic Atherosclerosis (ICD10-I70.0). Electronically Signed   By: Marijo Conception M.D.   On: 01/22/2022 12:54   DG Abd Portable 2V  Result Date: 01/21/2022 CLINICAL DATA:  Abdominal pain EXAM: PORTABLE ABDOMEN - 2 VIEW COMPARISON:  Abdomen radiograph done on 12/09/2021 and CT done on 01/13/2022 FINDINGS: There is moderate to marked gaseous distention of colon. Ascending colon measures 10 cm in diameter. Surgical clips are noted in epigastrium and left upper quadrant. There is interval decrease in amount of stool  in colon. IMPRESSION: There is moderate to marked gaseous distention of colon suggesting possible ileus. There is no significant small bowel dilation. Electronically Signed   By: Elmer Picker M.D.   On: 01/21/2022 13:00    IMPRESSION/PLAN:  48) 71 year old male admitted to the hospital with fever, AMS and abdominal pain. Infectious work up was unrevealing and his initial CTAP was negative for any acute intra-abdominal/pelvic infectious pathology. His generalized abdominal pain  progressively worsened with significant abdominal distension. CTAP without contrast today showed a mildly distended air-filled colon and rectum with moderate wall thickening to the proximal sigmoid colon which may represent focal inflammation but neoplastic process could not be excluded. Query developing ileus. Colonoscopy 08/2014 showed diverticulosis throughout the colon -Clear liquid diet -Consider flexible sigmoidoscopy vs colonoscopy, await further recommendations per Dr. Lyndel Safe -Abdominal xray in am -Limit narcotic use -BMP and magnesium level  2) GI bleed. He passed a melenic stool x 1 two days ago. Today he passed a darker stool with red blood x 1. Last dose of Plavix was 10am on 01/21/2022. Admission Hg 13.1 continues to drift downward -> Hg today 11.8.  -Continue to hold Plavix -PPI IV  -? EGD,  await further recommendations per Dr. Lyndel Safe  3) Acute on chronic anemia, secondary to CKD, GI bleed.   4) Acute metabolic encephalopathy (likely from sepsis) confounded by expressive aphasia/recent CVA  5) S/P acute left MCA branch CVA with right sided weakness and aphasia. On Plavix and ASA x 3 weeks then only Plavix. Last dose of Plavix was given at 10 am on 01/21/2022. Normal swallow study per speech pathologist 01/14/2022.  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 K+ 2.9 on 01/21/2022, received KCL 40 meq po -BMP and magnesium level as ordered above     Noralyn Pick  01/22/2022, 2:54PM   Attending physician's note   I have taken history, reviewed the chart and examined the patient. I performed a substantive portion of this encounter, including complete performance of at least one of the key components, in conjunction with the APP. I agree with the Advanced Practitioner's note, impression and recommendations.   71yrold with expressive aphasia d/t recent embolic CVA 73/0/8168on plavix since Adm with SIRS/sepsis with metabolic encephalopathy, AKI, urinary retention req foley. Not on any A/Bs. GI being consulted d/t GI bleed with NCCT showing mod Px sigmoid colitis vs mass with distention. Hb stable. HD stable. Had H/O ?melenotic stool.  H/O chronic constipation with fecal impaction. Neg colon 08/2014 except for redundancy. Comorbidities include HTN, CKD 3, dCHF (EF 55-60%), SVT  Plan: -Check stool for GI pathogen, C diff  -Monitor and correct electrolytes. Keep K>4, Mg>2. Give IV please. -Serial x-ray KUB's (developing ileus) -IV Protonix -Agree to hold Plavix until GI bleeding resolves. Note last plavix dose 8/17 -May require FS/EGD near D/C.  Earlier, if continued bleeding.  Dr DLoletha Carrowtaking over the service tomorrow.   RCarmell Austria MD LVelora HecklerGI [phone]

## 2022-01-22 NOTE — TOC Progression Note (Signed)
Transition of Care [facility]) - Progression Note    Patient Details  Name: [name] MRN: [id number] Date of Birth: 1950/07/08  Transition of Care [facility] CM/SW [place], [place] Phone Number: 01/22/2022, 12:43 PM  Clinical Narrative:    CSW spoke with [facility] regarding insurance denial and pt returning to their facility for LTC. They state that pt currently has no payor source as pt's Medicaid is pending and state family has been noncompliant with providing information for pt's Medicaid. They will be contacting their administrator and business office to determine if pt is able to return.  CSW contacted pt's son and informed him of concerns with pt returning to [facility]. Pt's son is to call [place] to provide needed information.    Expected Discharge Plan: Skilled Nursing Facility Barriers to Discharge: Continued Medical Work up  Expected Discharge Plan and Services Expected Discharge Plan: [place] In-house Referral: Clinical Social Work Discharge Planning Services: CM Consult Post Acute Care Choice: [place] Living arrangements for the past 2 months: [place]                 DME Arranged: N/A DME Agency: NA                   Social Determinants of Health (SDOH) Interventions    Readmission Risk Interventions    10/22/2019   12:04 PM  Readmission Risk Prevention Plan  Transportation Screening Complete  PCP or Specialist Appt within 3-5 Days Not Complete  Not Complete comments pending medical stability  HRI or Home Care Consult Complete  Social Work Consult for [place] Planning/Counseling Complete  Palliative Care Screening Not Applicable  Medication Review Press [employer]) Referral to Pharmacy

## 2022-01-22 NOTE — Progress Notes (Signed)
Physical Therapy Treatment Patient Details Name: [name] MRN: [id number] DOB: 1950/11/11 Today's Date: 01/22/2022   History of Present Illness 71 y.o. 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  On presentation, EMS reported temperature of 102.  In the ED, he was tachycardic, tachypneic with mild leukocytosis and creatinine elevated. Dx of acute metabolic encephalopathy, possible sepsis (PNA vs infectious diarrhea vs UTI).    PT Comments    Pt mobilizing much better today, only requiring supervision and increased time to complete supine to sit bed mobility. Pt sat EOB for 34mn to perform self-care tasks and complete extremity exercises, demonstrated good sitting balance as time progressed, initially required min assist to correct posterior lean but then was able to sit under own power with only supervision for safety. Required mod assist +2 to return to supine. R hand presents with increased edema as compared to prior sessions this week, performed some light write and finger PROM, provided squeeze ball, and encouraged pt to continue attempting to utilize RUE and RLE as well; pt would benefit from OT consult, order placed, would appreciate input. Pt is making incremental progress. Discharge destination remains appropriate. We will continue to work with him acutely.    Recommendations for follow up therapy are one component of a multi-disciplinary discharge planning process, led by the attending physician.  Recommendations may be updated based on patient status, additional functional criteria and insurance authorization.  Follow Up Recommendations  Skilled nursing-short term rehab (<3 hours/day) Can patient physically be transported by private  vehicle: No   Assistance Recommended at Discharge Frequent or constant Supervision/Assistance  Patient can return home with the following Two people to help with walking and/or transfers;Two people to help with bathing/dressing/bathroom;Assistance with cooking/housework;Direct supervision/assist for medications management;Assist for transportation;Help with stairs or ramp for entrance;Direct supervision/assist for financial management   Equipment Recommendations  None recommended by PT    Recommendations for Other Services OT consult     Precautions / Restrictions Precautions Precautions: Fall Precaution Comments: severe knee pain Restrictions Weight Bearing Restrictions: No     Mobility  Bed Mobility Overal bed mobility: Needs Assistance Bed Mobility: Supine to Sit, Sit to Supine     Supine to sit: HOB elevated, Supervision Sit to supine: +2 for physical assistance, Mod assist   General bed mobility comments: Supine to Sit: supervision with Incr time d/t pain. pt is able to self assist RLE using BUE, with incr time able to self assist with trunk,grimacing in pain. Sit to supine: mod assist +2 for bringing BLE into bed and controlling trunk lowering to bed, pt grimacing in pain. Pt sat EOB for 348m performing BUE activities.    Transfers                   General transfer comment: deferred    Ambulation/Gait               General Gait Details: unsafe at present   Stairs             Wheelchair Mobility    Modified Rankin (Stroke Patients Only)       Balance Overall balance assessment: Needs assistance Sitting-balance support: Feet supported, No upper extremity supported Sitting balance-Leahy Scale: Fair Sitting balance - Comments: Pt required min assist for  first 32mn to provide correction of very mild posterior lean, but for the remaining ~239m no assist at all. Postural control: Posterior lean     Standing balance comment: unable at  present                            Cognition Arousal/Alertness: Awake/alert Behavior During Therapy: WFL for tasks assessed/performed Overall Cognitive Status: No family/caregiver present to determine baseline cognitive functioning                                 General Comments: able to communicate in [place] RN speaks  speaks [place] 4-5 other languages, dysarthria, expressive aphasia, some perseveration        Exercises General Exercises - Upper Extremity Shoulder Flexion: AROM, Both, 10 reps, Seated (Lifting cardboard bucket) Elbow Flexion: AROM, Right, 10 reps, Seated Elbow Extension: AROM, Right, 10 reps, Seated Digit Composite Flexion: AROM, Right, Other (comment), 15 reps (Making a fist, first around squeeze ball and then around therapist fingers) General Exercises - Lower Extremity Ankle Circles/Pumps: Both, 10 reps, AAROM, Seated Long Arc Quad: AAROM, Both, 10 reps, Seated (Pt required more assist R>L) Other Exercises Other Exercises: Pt sat EOB for 3090mto perform handwashing, facewashing, and seated BUE and BLE exercises. Pt required mild min assist for maintaining upright trunk but then was able to complete support self with only supervision.    General Comments        Pertinent Vitals/Pain Pain Assessment Pain Assessment: Faces Faces Pain Scale: Hurts whole lot Breathing: normal Negative Vocalization: none Facial Expression: smiling or inexpressive Body Language: relaxed Consolability: no need to console PAINAD Score: 0 Pain Location: abdomen Pain Descriptors / Indicators: Grimacing, Moaning Pain Intervention(s): Monitored during session, Repositioned, Limited activity within patient's tolerance    Home Living                          Prior Function            PT Goals (current goals can now be found in the care plan section) Acute Rehab PT Goals PT Goal Formulation: With patient Time For Goal Achievement:  01/29/22 Potential to Achieve Goals: Fair Progress towards PT goals: Progressing toward goals    Frequency    Min 2X/week      PT Plan Current plan remains appropriate    Co-evaluation              AM-PAC PT "6 Clicks" Mobility   Outcome Measure  Help needed turning from your back to your side while in a flat bed without using bedrails?: A Lot Help needed moving from lying on your back to sitting on the side of a flat bed without using bedrails?: A Little Help needed moving to and from a bed to a chair (including a wheelchair)?: Total Help needed standing up from a chair using your arms (e.g., wheelchair or bedside chair)?: Total Help needed to walk in hospital room?: Total Help needed climbing 3-5 steps with a railing? : Total 6 Click Score: 9    End of Session   Activity Tolerance: Patient tolerated treatment well;No increased pain Patient left: in bed;with call bell/phone within reach Nurse Communication: Mobility status PT Visit Diagnosis: Difficulty in walking, not elsewhere classified (R26.2);Pain;Hemiplegia and hemiparesis Hemiplegia - Right/Left: Right Pain - Right/Left: Right (bil R>L) Pain -  part of body: Leg     Time: 1115-1155 PT Time Calculation (min) (ACUTE ONLY): 40 min  Charges:  $Therapeutic Exercise: 8-22 mins $Therapeutic Activity: 8-22 mins $Self Care/Home Management: 8-22                     Coolidge Breeze, PT, DPT [place] [facility] Department Office: [phone] Pager: [phone]   Coolidge Breeze 01/22/2022, 1:20 PM

## 2022-01-22 NOTE — Care Management Important Message (Signed)
Important Message  Patient Details IM Letter given to Patient. Name: [name] MRN: [id number] Date of Birth: 1951-03-31   Medicare Important Message Given:  Yes     Kerin Salen 01/22/2022, 11:12 AM

## 2022-01-23 ENCOUNTER — Inpatient Hospital Stay (HOSPITAL_COMMUNITY): Payer: Medicare Other

## 2022-01-23 DIAGNOSIS — K567 Ileus, unspecified: Secondary | ICD-10-CM | POA: Diagnosis not present

## 2022-01-23 DIAGNOSIS — I4729 Other ventricular tachycardia: Secondary | ICD-10-CM | POA: Diagnosis not present

## 2022-01-23 DIAGNOSIS — I5033 Acute on chronic diastolic (congestive) heart failure: Secondary | ICD-10-CM | POA: Diagnosis not present

## 2022-01-23 DIAGNOSIS — R140 Abdominal distension (gaseous): Secondary | ICD-10-CM | POA: Diagnosis not present

## 2022-01-23 DIAGNOSIS — I471 Supraventricular tachycardia: Secondary | ICD-10-CM | POA: Diagnosis not present

## 2022-01-23 DIAGNOSIS — Z8673 Personal history of transient ischemic attack (TIA), and cerebral infarction without residual deficits: Secondary | ICD-10-CM | POA: Diagnosis not present

## 2022-01-23 LAB — BASIC METABOLIC PANEL
Anion gap: 7 (ref 5–15)
BUN: 36 mg/dL — ABNORMAL HIGH (ref 8–23)
CO2: 27 mmol/L (ref 22–32)
Calcium: 8.7 mg/dL — ABNORMAL LOW (ref 8.9–10.3)
Chloride: 109 mmol/L (ref 98–111)
Creatinine, Ser: 1.89 mg/dL — ABNORMAL HIGH (ref 0.61–1.24)
GFR, Estimated: 37 mL/min — ABNORMAL LOW (ref >60)
Glucose, Bld: 91 mg/dL (ref 70–99)
Potassium: 3.5 mmol/L (ref 3.5–5.1)
Sodium: 143 mmol/L (ref 135–145)

## 2022-01-23 LAB — MAGNESIUM: Magnesium: 1.8 mg/dL (ref 1.7–2.4)

## 2022-01-23 LAB — CBC
HCT: 35.7 % — ABNORMAL LOW (ref 39.0–52.0)
Hemoglobin: 11.6 g/dL — ABNORMAL LOW (ref 13.0–17.0)
MCH: 29.1 pg (ref 26.0–34.0)
MCHC: 32.5 g/dL (ref 30.0–36.0)
MCV: 89.7 fL (ref 80.0–100.0)
Platelets: 559 10*3/uL — ABNORMAL HIGH (ref 150–400)
RBC: 3.98 MIL/uL — ABNORMAL LOW (ref 4.22–5.81)
RDW: 16 % — ABNORMAL HIGH (ref 11.5–15.5)
WBC: 9.4 10*3/uL (ref 4.0–10.5)
nRBC: 0 % (ref 0.0–0.2)

## 2022-01-23 NOTE — Evaluation (Signed)
Occupational Therapy Evaluation Patient Details Name: [name] MRN: [id number] DOB: 01-Jul-1950 Today's Date: 01/23/2022   History of Present Illness 71 y.o. 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  On presentation, EMS reported temperature of 102.  In the ED, he was tachycardic, tachypneic with mild leukocytosis and creatinine elevated. Dx of acute metabolic encephalopathy, possible sepsis (PNA vs infectious diarrhea vs UTI).   Clinical Impression   Patient is a 71 year old male who was noted to have had recent CVA with transition to SNF for rehab. Patient was noted to have increased confusion and pain during session with no family present. Patient was able to sit EOB and attempt to engage in ADL tasks with increased physical assistance. Patient was noted to have decreased functional activity tolerance, decreased endurance, decreased sitting balance, increased pain,decreased standing balance, decreased safety awareness, and decreased knowledge of AD/AE impacting participation in ADLs. Patient would continue to benefit from skilled OT services at this time while admitted and after d/c to address noted deficits in order to improve overall safety and independence in ADLs.        Recommendations for follow up therapy are one component of a multi-disciplinary discharge planning process, led by the attending physician.  Recommendations may be updated based on patient status, additional functional criteria and insurance authorization.   Follow Up Recommendations  Skilled nursing-short term rehab (<3 hours/day)    Assistance Recommended at Discharge Frequent or constant Supervision/Assistance  Patient can return home with the following Two  people to help with walking and/or transfers;Two people to help with bathing/dressing/bathroom;Direct supervision/assist for medications management;Help with stairs or ramp for entrance;Assist for transportation;Direct supervision/assist for financial management;Assistance with cooking/housework;Assistance with feeding    Functional Status Assessment  Patient has had a recent decline in their functional status and demonstrates the ability to make significant improvements in function in a reasonable and predictable amount of time.  Equipment Recommendations  Other (comment) (defer to next level of care)    Recommendations for Other Services       Precautions / Restrictions Precautions Precautions: Fall Precaution Comments: severe knee pain, extends when attempting to stand. Restrictions Weight Bearing Restrictions: No      Mobility Bed Mobility Overal bed mobility: Needs Assistance Bed Mobility: Supine to Sit, Sit to Supine, Rolling Rolling: Mod assist, +2 for physical assistance   Supine to sit: HOB elevated, Supervision Sit to supine: +2 for physical assistance, Mod assist   General bed mobility comments: Rolling: pt required mod assist +2 for rolling R & L for pericare, NT present to collect stool sample and assist. Supine to Sit: supervision with Incr time d/t pain. pt is able to self assist RLE using BUE, with incr time able to self assist with trunk,grimacing in pain. Sit to supine: mod assist +2 for bringing BLE into bed and controlling trunk lowering to bed, pt grimacing in pain. Pt able to self-scoot up in bed using BUE and BLE with moderate multimodal cuing.    Transfers                          Balance Overall balance assessment: Needs assistance Sitting-balance support: Feet supported, No upper extremity supported Sitting balance-Leahy Scale: Fair Sitting balance - Comments: Pt  required min assist for first 17mn to provide correction of very mild posterior  lean, but for the remaining ~263m no assist at all. Postural control: Posterior lean     Standing balance comment: unable at present                           ADL either performed or assessed with clinical judgement   ADL Overall ADL's : Needs assistance/impaired Eating/Feeding: Set up;Sitting Eating/Feeding Details (indicate cue type and reason): to take sips of grape juice Grooming: Oral care;Minimal assistance Grooming Details (indicate cue type and reason): sittign EOB with cues not to swallow the mouthwash. Upper Body Bathing: Sitting;Moderate assistance   Lower Body Bathing: Total assistance;Bed level   Upper Body Dressing : Moderate assistance;Sitting   Lower Body Dressing: Bed level;Total assistance   Toilet Transfer: +2 for physical assistance;+2 for safety/equipment Toilet Transfer Details (indicate cue type and reason): uanble to attempt standign with patients current pain levels and cog. patient was able to sit EOB for about 20 mins to engage in ADl tasks and BUE exercises. Toileting- Clothing Manipulation and Hygiene: Bed level;Total assistance Toileting - Clothing Manipulation Details (indicate cue type and reason): patient was noted to have had BM prior to arrival with max A +2 for hygiene and rollling tasks             Vision   Additional Comments: unable to formally assess with patients confusion     Perception     Praxis      Pertinent Vitals/Pain Pain Assessment Pain Assessment: Faces Faces Pain Scale: Hurts worst Pain Location: abdomen Pain Descriptors / Indicators: Grimacing, Moaning, Discomfort, Crying Pain Intervention(s): Limited activity within patient's tolerance, Monitored during session, Repositioned     Hand Dominance Right   Extremity/Trunk Assessment Upper Extremity Assessment Upper Extremity Assessment: RUE deficits/detail;LUE deficits/detail RUE Deficits / Details: noted to have some weakness sitting EOB with grossly  able to pick up arm holdign pillow with BE to above 90 degrees. patient had difficulty following cues for formal MMT. H/o stroke effecting R side. able to grossly squeeze smal stress ball. LUE Deficits / Details: patient was noted to have edema balls posterior olecranon process that were hot. nurse made aware. bilaterally   Lower Extremity Assessment Lower Extremity Assessment: Defer to PT evaluation   Cervical / Trunk Assessment Cervical / Trunk Assessment:  (noted to have increased edema under R side of chin near mandibular joint to skill)   Communication Communication Communication: Expressive difficulties (noted to speak in different languages at times.)   Cognition Arousal/Alertness: Awake/alert Behavior During Therapy: [facility] assessed/performed Overall Cognitive Status: No family/caregiver present to determine baseline cognitive functioning                                 General Comments: patient reported 2027 for year and april 27th for day of week, patient also noted to speak in different languages at times during session.     General Comments       Exercises     Shoulder Instructions      Home Living Family/patient expects to be discharged to:: Skilled nursing facility                                 Additional Comments: pt with poor cognition and unable to  answer any questions regarding home set-up or PLOF with no family/caregivers present throughout. Oriented to self only.      Prior Functioning/Environment Prior Level of Function : Patient poor historian/Family not available             Mobility Comments: unable to determine ADLs Comments: unable to determine        OT Problem List: Decreased activity tolerance;Impaired balance (sitting and/or standing);Decreased safety awareness;Cardiopulmonary status limiting activity;Decreased knowledge of precautions;Decreased knowledge of use of DME or AE;Pain;Impaired UE functional  use;Decreased range of motion;Decreased strength;Decreased coordination      OT Treatment/Interventions: Self-care/ADL training;Therapeutic exercise;Neuromuscular education;Energy conservation;DME and/or AE instruction;Therapeutic activities;Balance training;Patient/family education    OT Goals(Current goals can be found in the care plan section) Acute Rehab OT Goals Patient Stated Goal: to drink some grape juice OT Goal Formulation: Patient unable to participate in goal setting Time For Goal Achievement: 02/06/22 Potential to Achieve Goals: Fair  OT Frequency: Min 2X/week    Co-evaluation PT/OT/SLP Co-Evaluation/Treatment: Yes Reason for Co-Treatment: Complexity of the patient's impairments (multi-system involvement);Necessary to address cognition/behavior during functional activity PT goals addressed during session: Mobility/safety with mobility OT goals addressed during session: ADL's and self-care      AM-PAC OT "6 Clicks" Daily Activity     Outcome Measure Help from another person eating meals?: A Little Help from another person taking care of personal grooming?: A Little Help from another person toileting, which includes using toliet, bedpan, or urinal?: Total Help from another person bathing (including washing, rinsing, drying)?: Total Help from another person to put on and taking off regular upper body clothing?: A Lot Help from another person to put on and taking off regular lower body clothing?: A Lot 6 Click Score: 12   End of Session Nurse Communication: Other (comment) (concerns over edema posterior to elbows with increased heat and edema on R side of neck/under chin)  Activity Tolerance: Patient limited by pain Patient left: in bed;with call bell/phone within reach;with bed alarm set  OT Visit Diagnosis: Unsteadiness on feet (R26.81);Other abnormalities of gait and mobility (R26.89);Pain;Muscle weakness (generalized) (M62.81)                Time: 4128-7867 OT Time  Calculation (min): 39 min Charges:  OT General Charges $OT Visit: 1 Visit OT Evaluation $OT Eval Moderate Complexity: 1 Mod OT Treatments $Self Care/Home Management : 8-22 mins  Jackelyn Poling OTR/L, MS Acute Rehabilitation Department Office# [phone] Pager# [phone]   Marcellina Millin 01/23/2022, 4:43 PM

## 2022-01-23 NOTE — Progress Notes (Addendum)
[facility] Progress Note  Chief Complaint: Abnormal imaging GI tract, colonic and small bowel ileus  History:  Signout received from Dr. Lyndel Safe, and additional chart review performed.  I also personally reviewed the images from his most recent abdominal pelvic CT scan and today's KUB. He denies abdominal pain, he is hungry, has not yet passed gas or bowel movement.  History limited somewhat by his aphasia.  No nursing reports of GI bleeding since he was seen in GI consultation yesterday. ROS: Denies chest pain or dyspnea He had relief of lower abdominal discomfort after urinary catheter placement. Objective:   Current Facility-Administered Medications:    0.9 %  sodium chloride infusion, , Intravenous, PRN, Starla Link, Kshitiz, MD, Last Rate: 10 mL/hr at 01/18/22 1506, Infusion Verify at 01/18/22 1506   acetaminophen (TYLENOL) tablet 650 mg, 650 mg, Oral, Q6H PRN, Starla Link, Kshitiz, MD, 650 mg at 01/19/22 1455   bisacodyl (DULCOLAX) suppository 10 mg, 10 mg, Rectal, Daily PRN, Starla Link, Kshitiz, MD   Chlorhexidine Gluconate Cloth 2 % PADS 6 each, 6 each, Topical, Daily, Alekh, Kshitiz, MD, 6 each at 01/23/22 1048   enoxaparin (LOVENOX) injection 40 mg, 40 mg, Subcutaneous, Q24H, Tu, Ching T, DO, 40 mg at 01/22/22 2115   feeding supplement (ENSURE ENLIVE / ENSURE PLUS) liquid 237 mL, 237 mL, Oral, BID BM, Alekh, Kshitiz, MD, 237 mL at 01/21/22 1412   furosemide (LASIX) tablet 40 mg, 40 mg, Oral, Daily, Donato Heinz, MD, 40 mg at 01/23/22 1048   iohexol (OMNIPAQUE) 300 MG/ML solution 100 mL, 100 mL, Intravenous, Once PRN, Isla Pence, MD   metoprolol tartrate (LOPRESSOR) tablet 25 mg, 25 mg, Oral, BID, Cecilie Kicks R, NP, 25 mg at 01/23/22 1048   morphine (PF) 2 MG/ML injection 2 mg, 2 mg, Intravenous, Q2H PRN, Starla Link, Kshitiz, MD, 2 mg at 01/22/22 0158   pantoprazole (PROTONIX) injection 40 mg, 40 mg, Intravenous, QHS, Kennedy-Smith, Colleen M, NP, 40 mg at 01/22/22 2114   polyethylene  glycol (MIRALAX / GLYCOLAX) packet 17 g, 17 g, Oral, Daily, Alekh, Kshitiz, MD, 17 g at 01/23/22 1049   senna-docusate (Senokot-S) tablet 2 tablet, 2 tablet, Oral, BID, Alekh, Kshitiz, MD, 2 tablet at 01/23/22 1048   tamsulosin (FLOMAX) capsule 0.4 mg, 0.4 mg, Oral, QPC supper, Alekh, Kshitiz, MD, 0.4 mg at 01/22/22 1810   traMADol (ULTRAM) tablet 50 mg, 50 mg, Oral, Q12H PRN, Starla Link, Kshitiz, MD, 50 mg at 01/20/22 0853   sodium chloride 10 mL/hr at 01/18/22 1506     Vital signs in last 24 hrs: Vitals:   01/22/22 1229 01/22/22 2019  BP: (!) 137/92 (!) 144/89  Pulse: 95 95  Resp: 18 20  Temp: 97.6 F (36.4 C) 99.4 F (37.4 C)  SpO2: 96% 100%    Intake/Output Summary (Last 24 hours) at 01/23/2022 1248 Last data filed at 01/22/2022 1657 Gross per 24 hour  Intake 260 ml  Output 550 ml  Net -290 ml     Physical Exam Chronically ill-appearing elderly man laying in bed.  He is in no acute distress. Cardiac: RRR without murmurs, S1S2 heard, trace peripheral edema Pulm: clear to auscultation bilaterally, normal RR and effort noted Abdomen: softly and mildly distended, no tenderness, with active bowel sounds of normal character Skin; warm and dry, no jaundice  Recent Labs:     Latest Ref Rng & Units 01/23/2022    5:37 AM 01/22/2022    1:27 PM 01/21/2022    5:51 AM  CBC  WBC 4.0 -  10.5 K/uL 9.4  9.5  9.1   Hemoglobin 13.0 - 17.0 g/dL 11.6  11.8  11.5   Hematocrit 39.0 - 52.0 % 35.7  36.7  35.5   Platelets 150 - 400 K/uL 559  553  546     No results for input(s): "INR" in the last 168 hours.    Latest Ref Rng & Units 01/23/2022    5:37 AM 01/22/2022    1:26 PM 01/21/2022    5:55 AM  CMP  Glucose 70 - 99 mg/dL 91  100  101   BUN 8 - 23 mg/dL 36  38  39   Creatinine 0.61 - 1.24 mg/dL 1.89  2.14  2.07   Sodium 135 - 145 mmol/L 143  145  144   Potassium 3.5 - 5.1 mmol/L 3.5  3.7  2.9   Chloride 98 - 111 mmol/L 109  112  111   CO2 22 - 32 mmol/L '27  26  27   '$ Calcium 8.9 - 10.3  mg/dL 8.7  9.1  8.8      Radiologic studies: CLINICAL DATA:  Possible ileus   EXAM: PORTABLE ABDOMEN - 1 VIEW   COMPARISON:  01/21/2022   FINDINGS: Left-lateral abdomen is excluded from the field of view.   Improved gaseous distension of the small bowel and colon. No bowel dilatation to suggest obstruction. No evidence of pneumoperitoneum, portal venous gas or pneumatosis.   No pathologic calcifications along the expected course of the ureters.   No acute osseous abnormality.   IMPRESSION: 1. Improved gaseous distension of the small bowel and colon likely reflecting a resolving ileus.     Electronically Signed   By: Kathreen Devoid M.D.   On: 01/23/2022 09:04   Assessment & Plan  Assessment:  Colonic and small bowel ileus.  I believe this is due to his recent illnesses and debility.  It does not appear likely be neoplastic or other mechanical obstruction based on my review of the CT scan.  Personally reviewed today's KUB shows that it is improving with no small bowel dilatation, and there is a lesser degree of colonic dilatation seen.  Unclear reports of GI bleeding.  If the reported dark or black stools were GI bleeding, there does not appear to be any ongoing now.  Hemoglobin is stable, and I would forego any endoscopic procedures at this point.  Plan: Continue clear liquid diet for now, I will add some liquid protein calorie supplements. Encourage ambulation is much as possible, keep magnesium and potassium normal. I will see him tomorrow and decide about diet advancement based on his clinical progress.   Nelida Meuse III Office: [phone]

## 2022-01-23 NOTE — Progress Notes (Signed)
OT Cancellation Note  Patient Details Name: [name] MRN: [id number] DOB: 04-18-1951   Cancelled Treatment:    Reason Eval/Treat Not Completed: Patient declined, no reason specified Patient declined reporting " not now I dont feel good". Patient did not clarify what was not feeling well at this time. Nurse made aware. OT to continue to follow and check back as schedule will allow.  Jackelyn Poling OTR/L, [facility] Office# [phone] Pager# [phone]  01/23/2022, 1:50 PM

## 2022-01-23 NOTE — Progress Notes (Signed)
PROGRESS NOTE  [name] VEH:[id number] DOB: 1951-06-02 DOA: 01/13/2022 PCP: Biagio Borg, MD   LOS: 9 days   Brief Narrative / Interim history: 71 year old male with medical history significant of HTN, HLD , HFpEF, BPH, CKD 3b, recent admission and discharged from 12/09/2021-12/12/2021 for acute left MCA branch possible embolic CVA resulting in expressive aphasia and right-sided weakness with discharge to SNF presented with fever and altered mental status.  On presentation, EMS reported temperature of 102.  In the ED, he was tachycardic, tachypneic with mild leukocytosis and creatinine elevated to 2.82 from prior of 1.96.  CT of abdomen and pelvis showed bibasilar atelectasis with superimposed developing infection/inflammation not excluded with fresh transition state of the colon, no diverticulitis.  He was started on IV fluids and antibiotics.  Blood and urine cultures negative so far.  He required Foley catheter placement for urinary retention  Subjective / 24h Interval events: Distention appears better.  Assesement and Plan: Principal Problem:   AMS (altered mental status) Active Problems:   Obstructive uropathy   Essential hypertension   BPH (benign prostatic hyperplasia)   Chronic kidney disease, stage 3b (HCC)   AKI (acute kidney injury) ([place])   Hypokalemia   Acute urinary retention   Aortic root dilatation (HCC)   Chronic heart failure with preserved ejection fraction (HFpEF) (HCC)   Expressive aphasia   Sepsis ([place])   History of CVA (cerebrovascular accident)   Acute metabolic encephalopathy   Leukocytosis   Obesity (BMI 30-39.9)   Physical deconditioning   SVT (supraventricular tachycardia) (HCC)   Acute on chronic diastolic heart failure (HCC)   NSVT (nonsustained ventricular tachycardia) (HCC)   Principal problem SIRS, possible sepsis, POA-unclear etiology, there was questionable bacterial pneumonia, infective diarrhea and concern for UTI.  He had fever, tachycardia,  tachypnea and elevated white count on admission.  Cultures have remained negative so far, GI pathogen panel and C. difficile were not run because diarrhea resolved.  Has completed 7 days of empiric antibiotics, now monitored off.  Sugars have remained negative  Active problems Acute metabolic encephalopathy-possibly from fever, sepsis.  Improved, suspect close to baseline.  Still has expressive aphasia from prior stroke.  Leukocytosis-WBC normalized  Normocytic anemia-From anemia of chronic disease. Hemoglobin stable. Monitor  Abdominal distention-better today, abdominal x-ray this morning with improved distention suggesting a resolving ileus  Concern for sigmoid colon mass, concern for lower GI bleed-based on the CT scan done 8/18 and appearance of the stool.  GI consulted, defer further work-up to them.  For now Plavix is on hold.  Hemoglobin is stable  Acute kidney injury on CKD stage IIIb -Creatinine 2.83 on presentation; was 1.96 on discharge last month.  Has received IV fluids initially, now discontinued.  Received Lasix 8/14 and 8/15 per cardiology due to fluid overload.  Has now stabilized and returned to baseline, currently on oral furosemide and creatinine this morning is 1.89  History of CVA with residual expressive aphasia and right-sided weakness -Was hospitalized last month because of above and subsequently discharged to SNF.  Patient possibly has residual aphasia with aphasia and right-sided weakness from prior stroke. MRI of brain during this admission was negative for any stroke. Outpatient follow-up with neurology.  Plavix now on brief hold due to concern for GI bleed   SVT -Patient had an episode of SVT with heart rate going up to 170 on 01/18/2022.  Cardiology has been consulted: Continue metoprolol   Chronic diastolic heart failure -Has slightly swollen lower extremities.  Strict  input and output.  Daily weights.  Patient received Lasix 8/14, 8/15.  Status better.  Continue  p.o. 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 Recommend follow-up ultrasound every 3 years   BPH, acute urinary retention -With lower urinary tract symptoms.  Patient required in and out cath few times and subsequently Foley catheter placed on 01/14/2022.  Flomax started on 01/14/2022.  Failed voiding trial 8/16, he will need to be discharged with a Foley catheter and have outpatient urology follow-up   Essential hypertension -Monitor blood pressure.   Physical deconditioning -patient will have to return back to SNF.  TOC following.  Hypokalemia -replace again today   Obesity -Outpatient follow-up   Chronic knee pain -Follows up with orthopedic/sports medicine as an outpatient for recurrent steroid injections.  Outpatient follow-up with orthopedics.  Scheduled Meds:  Chlorhexidine Gluconate Cloth  6 each Topical Daily   enoxaparin (LOVENOX) injection  40 mg Subcutaneous Q24H   feeding supplement  237 mL Oral BID BM   furosemide  40 mg Oral Daily   metoprolol tartrate  25 mg Oral BID   pantoprazole (PROTONIX) IV  40 mg Intravenous QHS   polyethylene glycol  17 g Oral Daily   senna-docusate  2 tablet Oral BID   tamsulosin  0.4 mg Oral QPC supper   Continuous Infusions:  sodium chloride 10 mL/hr at 01/18/22 1506   PRN Meds:.sodium chloride, acetaminophen, bisacodyl, iohexol, morphine injection, traMADol  Diet Orders (From admission, onward)     Start     Ordered   01/22/22 0141  Diet clear liquid Room service appropriate? Yes; Fluid consistency: Thin  Diet effective now       Question Answer Comment  Room service appropriate? Yes   Fluid consistency: Thin      01/22/22 0140            DVT prophylaxis: enoxaparin (LOVENOX) injection 40 mg Start: 01/13/22 2230   Lab Results  Component Value Date   PLT 559 (H) 01/23/2022      Code Status: Full  Code  Family Communication: No family at bedside  Status is: Inpatient Remains inpatient appropriate because: Worsening abdominal distention   Level of care: Telemetry  Consultants:  cards   Objective: Vitals:   01/22/22 0542 01/22/22 1229 01/22/22 2019 01/23/22 0500  BP: 124/62 (!) 137/92 (!) 144/89   Pulse: 83 95 95   Resp: '20 18 20   '$ Temp: 98 F (36.7 C) 97.6 F (36.4 C) 99.4 F (37.4 C)   TempSrc: Oral Oral Oral   SpO2: 97% 96% 100%   Weight:    109.2 kg  Height:        Intake/Output Summary (Last 24 hours) at 01/23/2022 1121 Last data filed at 01/22/2022 1657 Gross per 24 hour  Intake 260 ml  Output 850 ml  Net -590 ml    Wt Readings from Last 3 Encounters:  01/23/22 109.2 kg  12/09/21 117.5 kg  11/30/21 113.6 kg    Examination:  Constitutional: NAD Eyes: lids and conjunctivae normal, no scleral icterus ENMT: mmm Neck: normal, supple Respiratory: clear to auscultation bilaterally, no wheezing, no crackles. Normal respiratory effort.  Cardiovascular: Regular rate and rhythm, no murmurs / rubs / gallops. No LE edema. Abdomen: soft, mild distention-improved, no tenderness. Bowel sounds positive.  Skin: no rashes Neurologic: no focal deficits, equal strength  Data Reviewed: I have independently reviewed following labs and imaging studies  CBC Recent Labs  Lab 01/17/22 0609 01/18/22 0456 01/19/22 0657 01/20/22 0744 01/21/22 0551 01/22/22 1327 01/23/22 0537  WBC 13.9* 15.4* 13.2* 10.1 9.1 9.5 9.4  HGB 12.0* 12.1* 11.5* 11.7* 11.5* 11.8* 11.6*  HCT 36.7* 36.5* 35.1* 35.7* 35.5* 36.7* 35.7*  PLT 380 429* 506* 550* 546* 553* 559*  MCV 87.8 88.8 88.2 89.0 90.8 89.5 89.7  MCH 28.7 29.4 28.9 29.2 29.4 28.8 29.1  MCHC 32.7 33.2 32.8 32.8 32.4 32.2 32.5  RDW 15.6* 15.9* 15.9* 16.0* 16.3* 16.2* 16.0*  LYMPHSABS 2.3 2.4 2.0 2.0  --   --   --   MONOABS 1.2* 1.2* 1.0 0.9  --   --   --   EOSABS 0.1 0.1 0.1 0.2  --   --   --   BASOSABS 0.1 0.1 0.1 0.1   --   --   --      Recent Labs  Lab 01/17/22 0609 01/18/22 0456 01/19/22 0657 01/20/22 0744 01/21/22 0555 01/22/22 1326 01/23/22 0537  NA 143 143 143 145 144 145 143  K 3.5 3.3* 3.2* 3.2* 2.9* 3.7 3.5  CL 111 110 110 109 111 112* 109  CO2 '25 24 25 27 27 26 27  '$ GLUCOSE 112* 96 103* 107* 101* 100* 91  BUN 28* 34* 43* 40* 39* 38* 36*  CREATININE 1.96* 2.03* 2.17* 2.07* 2.07* 2.14* 1.89*  CALCIUM 8.7* 8.6* 8.7* 8.7* 8.8* 9.1 8.7*  AST 26  --   --   --   --   --   --   ALT 19  --   --   --   --   --   --   ALKPHOS 86  --   --   --   --   --   --   BILITOT 0.8  --   --   --   --   --   --   ALBUMIN 2.3*  --   --   --   --   --   --   MG 1.9 2.0 1.9 1.9  --  1.9 1.8  CRP 34.9*  --   --   --   --   --   --   BNP  --   --   --  41.7  --   --   --      ------------------------------------------------------------------------------------------------------------------ No results for input(s): "CHOL", "HDL", "LDLCALC", "TRIG", "CHOLHDL", "LDLDIRECT" in the last 72 hours.  Lab Results  Component Value Date   HGBA1C 5.6 12/09/2021   ------------------------------------------------------------------------------------------------------------------ No results for input(s): "TSH", "T4TOTAL", "T3FREE", "THYROIDAB" in the last 72 hours.  Invalid input(s): "FREET3"  Cardiac Enzymes No results for input(s): "CKMB", "TROPONINI", "MYOGLOBIN" in the last 168 hours.  Invalid input(s): "CK" ------------------------------------------------------------------------------------------------------------------    Component Value Date/Time   BNP 41.7 01/20/2022 0744    CBG: No results for input(s): "GLUCAP" in the last 168 hours.  Recent Results (from the past 240 hour(s))  Resp Panel by RT-PCR (Flu A&B, Covid) Anterior Nasal Swab     Status: None   Collection Time: 01/13/22  3:12 PM   Specimen: Anterior Nasal Swab  Result Value Ref Range Status   SARS Coronavirus 2 by RT PCR NEGATIVE  NEGATIVE Final    Comment: (NOTE) SARS-CoV-2 target nucleic acids are NOT DETECTED.  The SARS-CoV-2 RNA is generally detectable in upper respiratory specimens during the acute phase of infection. The lowest concentration of SARS-CoV-2 viral copies this assay can detect is  138 copies/mL. A negative result does not preclude SARS-Cov-2 infection and should not be used as the sole basis for treatment or other patient management decisions. A negative result may occur with  improper specimen collection/handling, submission of specimen other than nasopharyngeal swab, presence of viral mutation(s) within the areas targeted by this assay, and inadequate number of viral copies(<138 copies/mL). A negative result must be combined with clinical observations, patient history, and epidemiological information. The expected result is Negative.  Fact Sheet for Patients:  EntrepreneurPulse.com.au  Fact Sheet for Healthcare Providers:  IncredibleEmployment.be  This test is no t yet approved or cleared by the [place] FDA and  has been authorized for detection and/or diagnosis of SARS-CoV-2 by FDA under an Emergency Use Authorization (EUA). This EUA will remain  in effect (meaning this test can be used) for the duration of the COVID-19 declaration under Section 564(b)(1) of the Act, 21 U.S.C.section 360bbb-3(b)(1), unless the authorization is terminated  or revoked sooner.       Influenza A by PCR NEGATIVE NEGATIVE Final   Influenza B by PCR NEGATIVE NEGATIVE Final    Comment: (NOTE) The Xpert Xpress SARS-CoV-2/FLU/RSV plus assay is intended as an aid in the diagnosis of influenza from Nasopharyngeal swab specimens and should not be used as a sole basis for treatment. Nasal washings and aspirates are unacceptable for Xpert Xpress SARS-CoV-2/FLU/RSV testing.  Fact Sheet for Patients: EntrepreneurPulse.com.au  Fact Sheet for Healthcare  Providers: IncredibleEmployment.be  This test is not yet approved or cleared by the [place] FDA and has been authorized for detection and/or diagnosis of SARS-CoV-2 by FDA under an Emergency Use Authorization (EUA). This EUA will remain in effect (meaning this test can be used) for the duration of the COVID-19 declaration under Section 564(b)(1) of the Act, 21 U.S.C. section 360bbb-3(b)(1), unless the authorization is terminated or revoked.  Performed at [facility], [place] [address], [place], [place] [address]   Blood Culture (routine x 2)     Status: None   Collection Time: 01/13/22  3:12 PM   Specimen: BLOOD  Result Value Ref Range Status   Specimen Description   Final    BLOOD SITE NOT SPECIFIED Performed at [facility] [address], [place], [place] [address]    Special Requests   Final    BOTTLES DRAWN AEROBIC AND ANAEROBIC Blood Culture adequate volume Performed at [facility] [address], [place], [place] [address]    Culture   Final    NO GROWTH 5 DAYS Performed at [facility], [place] [address], [place], [place] [address]    Report Status 01/18/2022 FINAL  Final  Blood Culture (routine x 2)     Status: None   Collection Time: 01/13/22  3:40 PM   Specimen: BLOOD  Result Value Ref Range Status   Specimen Description   Final    BLOOD SITE NOT SPECIFIED Performed at [facility] [address], [place], [place] [address]    Special Requests   Final    BOTTLES DRAWN AEROBIC AND ANAEROBIC Blood Culture results may not be optimal due to an inadequate volume of blood received in culture bottles Performed at [facility] [address] [place], [place] [address]    Culture   Final    NO GROWTH 5 DAYS Performed at [facility], [place] [address] [place], [place] [address]    Report Status 01/18/2022 FINAL  Final  Urine Culture      Status: None   Collection Time: 01/13/22  6:45 PM   Specimen: In/Out Cath Urine  Result Value Ref Range Status   Specimen Description   Final    IN/OUT CATH URINE Performed at [facility], [place] [address] [place], [place] [address]    Special Requests   Final    NONE Performed at [facility], [place] [address], [place], [place] [address]    Culture   Final    NO GROWTH Performed at [facility], [place] [address] [place], [place] [address]    Report Status 01/15/2022 FINAL  Final  MRSA Next Gen by PCR, Nasal     Status: None   Collection Time: 01/14/22 10:02 AM   Specimen: Nasal Mucosa; Nasal Swab  Result Value Ref Range Status   MRSA by PCR Next Gen NOT DETECTED NOT DETECTED Final    Comment: (NOTE) The GeneXpert MRSA Assay (FDA approved for NASAL specimens only), is one component of a comprehensive MRSA colonization surveillance program. It is not intended to diagnose MRSA infection nor to guide or monitor treatment for MRSA infections. Test performance is not FDA approved in patients less than 52 years old. Performed at [facility], [place] [address] [place], [place] [address]      Radiology Studies: DG Abd Portable 1V  Result Date: 01/23/2022 CLINICAL DATA:  Possible ileus EXAM: PORTABLE ABDOMEN - 1 VIEW COMPARISON:  01/21/2022 FINDINGS: Left-lateral abdomen is excluded from the field of view. Improved gaseous distension of the small bowel and colon. No bowel dilatation to suggest obstruction. No evidence of pneumoperitoneum, portal venous gas or pneumatosis. No pathologic calcifications along the expected course of the ureters. No acute osseous abnormality. IMPRESSION: 1. Improved gaseous distension of the small bowel and colon likely reflecting a resolving ileus. Electronically Signed   By: Kathreen Devoid M.D.   On: 01/23/2022 09:04   CT ABDOMEN PELVIS WO CONTRAST  Result Date: 01/22/2022 CLINICAL DATA:  Abdominal  pain. EXAM: CT ABDOMEN AND PELVIS WITHOUT CONTRAST TECHNIQUE: Multidetector CT imaging of the abdomen and pelvis was performed following the standard protocol without IV contrast. RADIATION DOSE REDUCTION: This exam was performed according to the departmental dose-optimization program which includes automated exposure control, adjustment of the mA and/or kV according to patient size and/or use of iterative reconstruction technique. COMPARISON:  January 13, 2022. FINDINGS: Lower chest: No acute abnormality. Hepatobiliary: No focal liver abnormality is seen. No gallstones, gallbladder wall thickening, or biliary dilatation. Pancreas: Unremarkable. No pancreatic ductal dilatation or surrounding inflammatory changes. Spleen: Status post splenectomy. Adrenals/Urinary Tract: Adrenal glands appear normal. Severe right renal atrophy is noted. Stable left renal cysts are noted. No further follow-up is required. No hydronephrosis or renal obstruction is noted. Urinary bladder is decompressed secondary to Foley catheter. Stomach/Bowel: Stomach is unremarkable. The appendix is unremarkable. No small bowel dilatation is noted. Mildly distended and air-filled colon is noted. There is moderate wall thickening of the proximal sigmoid colon which may represent focal inflammation, but neoplasm cannot be excluded. Vascular/Lymphatic: Aortic atherosclerosis. No enlarged abdominal or pelvic lymph nodes. Reproductive: Stable mild prostatic enlargement. Other: No ascites is noted. Postsurgical changes are seen involving the anterior abdominal wall consistent with prior hernia repair. Small fat containing periumbilical hernia is noted. Musculoskeletal: No acute or significant osseous findings. IMPRESSION: Mildly distended and air-filled colon is noted as well as the rectum. There is noted moderate wall thickening of the proximal sigmoid  colon which may represent focal inflammation, but neoplasm cannot be excluded. Sigmoidoscopy is  recommended for further evaluation. Severe right renal atrophy is noted. Stable left renal cysts are noted for which no further follow-up is required. Hydronephrosis or renal obstruction is noted. Urinary bladder is decompressed secondary to Foley catheter. Status post splenectomy. Surgical changes are seen involving anterior abdominal wall consistent with prior hernia repair. Aortic Atherosclerosis (ICD10-I70.0). Electronically Signed   By: Marijo Conception M.D.   On: 01/22/2022 12:54     Marzetta Board, MD, PhD [facility]  Between 7 am - 7 pm I am available, please contact me via Amion (for emergencies) or Securechat (non urgent messages)  Between 7 pm - 7 am I am not available, please contact night coverage MD/APP via Amion

## 2022-01-23 NOTE — Progress Notes (Signed)
Physical Therapy Treatment Patient Details Name: [name] MRN: [id number] DOB: [birth year] Today's Date: 01/23/2022   History of Present Illness 71 y.o. 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  On presentation, EMS reported temperature of 102.  In the ED, he was tachycardic, tachypneic with mild leukocytosis and creatinine elevated. Dx of acute metabolic encephalopathy, possible sepsis (PNA vs infectious diarrhea vs UTI).    PT Comments    Pt seen in co-treat with OT. Pt presented with increased swelling in R jaw compared to previous sessions yesterday and reporting pain/swelling/warmth in bilateral elbows, RN notified. Pt required mod assist +2 for bed mobility including rolling in bed to complete pericare--excepting supine to sit as pt required supervision for safety and increased time.  Pt completed BUE and BLE exercises with max cuing, R hand appears less swollen than yesterday though BLE are more swollen. Pt is not progressing out of bed at this time and continues to report abdominal pain; PT heard bowel sounds while standing >49f away and at one point pt crying out and grimacing in pain during wave of abdominal pain. We will continue to work with the pt acutely.    Recommendations for follow up therapy are one component of a multi-disciplinary discharge planning process, led by the attending physician.  Recommendations may be updated based on patient status, additional functional criteria and insurance authorization.  Follow Up Recommendations  Skilled nursing-short term rehab (<3 hours/day) Can patient physically be transported by private vehicle: No   Assistance Recommended at Discharge Frequent or constant Supervision/Assistance   Patient can return home with the following Two people to help with walking and/or transfers;Two people to help with bathing/dressing/bathroom;Assistance with cooking/housework;Direct supervision/assist for medications management;Assist for transportation;Help with stairs or ramp for entrance;Direct supervision/assist for financial management   Equipment Recommendations  None recommended by PT    Recommendations for Other Services       Precautions / Restrictions Precautions Precautions: Fall Precaution Comments: severe knee pain, extends when attempting to stand. Restrictions Weight Bearing Restrictions: No (pt is bedbound)     Mobility  Bed Mobility Overal bed mobility: Needs Assistance Bed Mobility: Supine to Sit, Sit to Supine, Rolling Rolling: Mod assist, +2 for physical assistance   Supine to sit: HOB elevated, Supervision Sit to supine: +2 for physical assistance, Mod assist   General bed mobility comments: Rolling: pt required mod assist +2 for rolling R & L for pericare, NT present to collect stool sample and assist. Supine to Sit: supervision with Incr time d/t pain. pt is able to self assist RLE using BUE, with incr time able to self assist with trunk,grimacing in pain. Sit to supine: mod assist +2 for bringing BLE into bed and controlling trunk lowering to bed, pt grimacing in pain. Pt able to self-scoot up in bed using BUE and BLE with moderate multimodal cuing.    Transfers                  General transfer comment: deferred    Ambulation/Gait               General Gait Details: unsafe at present   Stairs             Wheelchair Mobility    Modified Rankin (Stroke Patients  Only)       Balance Overall balance assessment: Needs assistance Sitting-balance support: Feet supported, No upper extremity supported Sitting balance-Leahy Scale: Fair Postural control: Posterior lean     Standing balance comment: unable at present                             Cognition Arousal/Alertness: Awake/alert Behavior During Therapy: WFL for tasks assessed/performed Overall Cognitive Status: No family/caregiver present to determine baseline cognitive functioning                                 General Comments: patient reported 2027 for year and april 27th for day of weelk        Exercises General Exercises - Lower Extremity Ankle Circles/Pumps: Both, 10 reps, AAROM, Seated Long Arc Quad: AAROM, Both, 10 reps, Seated (Pt required more assist R>L) Hip Flexion/Marching: AROM, AAROM, Both, 10 reps, Seated (Pt completed L actively without assist, R with some assist from PT) Other Exercises Other Exercises: Pt sat EOB for 20+ min to perform OT eval and seated BUE and BLE exercises.    General Comments        Pertinent Vitals/Pain Pain Assessment Pain Assessment: Faces Faces Pain Scale: Hurts worst Breathing: normal Negative Vocalization: none Facial Expression: smiling or inexpressive Body Language: relaxed Consolability: no need to console PAINAD Score: 0 Pain Location: abdomen Pain Descriptors / Indicators: Grimacing, Moaning, Discomfort, Crying Pain Intervention(s): Limited activity within patient's tolerance, Monitored during session, Repositioned    Home Living                          Prior Function            PT Goals (current goals can now be found in the care plan section) Acute Rehab PT Goals Patient Stated Goal: none stated PT Goal Formulation: With patient Time For Goal Achievement: 01/29/22 Potential to Achieve Goals: Fair Progress towards PT goals: Not progressing toward goals - comment    Frequency    Min 2X/week      PT Plan Current plan remains appropriate    Co-evaluation PT/OT/SLP Co-Evaluation/Treatment: Yes Reason for Co-Treatment: For patient/therapist safety;Necessary to address cognition/behavior during functional activity;Complexity of the  patient's impairments (multi-system involvement) PT goals addressed during session: Mobility/safety with mobility;Strengthening/ROM        AM-PAC PT "6 Clicks" Mobility   Outcome Measure  Help needed turning from your back to your side while in a flat bed without using bedrails?: A Lot Help needed moving from lying on your back to sitting on the side of a flat bed without using bedrails?: A Little Help needed moving to and from a bed to a chair (including a wheelchair)?: Total Help needed standing up from a chair using your arms (e.g., wheelchair or bedside chair)?: Total Help needed to walk in hospital room?: Total Help needed climbing 3-5 steps with a railing? : Total 6 Click Score: 9    End of Session   Activity Tolerance: Patient limited by fatigue Patient left: in bed;with call bell/phone within reach;with bed alarm set Nurse Communication: Mobility status PT Visit Diagnosis: Difficulty in walking, not elsewhere classified (R26.2);Pain;Hemiplegia and hemiparesis Hemiplegia - Right/Left: Right Pain - Right/Left: Right (bil R>L) Pain - part of body: Leg (abdomen)     Time: 3295-1884 PT Time Calculation (min) (ACUTE ONLY): 37  min  Charges:                        Coolidge Breeze, PT, DPT [facility] Office: [phone] Pager: [phone]   Coolidge Breeze 01/23/2022, 4:04 PM

## 2022-01-24 DIAGNOSIS — R410 Disorientation, unspecified: Secondary | ICD-10-CM | POA: Diagnosis not present

## 2022-01-24 DIAGNOSIS — K567 Ileus, unspecified: Secondary | ICD-10-CM | POA: Diagnosis not present

## 2022-01-24 DIAGNOSIS — K921 Melena: Secondary | ICD-10-CM | POA: Diagnosis not present

## 2022-01-24 LAB — BASIC METABOLIC PANEL
Anion gap: 4 — ABNORMAL LOW (ref 5–15)
BUN: 32 mg/dL — ABNORMAL HIGH (ref 8–23)
CO2: 27 mmol/L (ref 22–32)
Calcium: 8.8 mg/dL — ABNORMAL LOW (ref 8.9–10.3)
Chloride: 112 mmol/L — ABNORMAL HIGH (ref 98–111)
Creatinine, Ser: 1.78 mg/dL — ABNORMAL HIGH (ref 0.61–1.24)
GFR, Estimated: 40 mL/min — ABNORMAL LOW (ref >60)
Glucose, Bld: 87 mg/dL (ref 70–99)
Potassium: 3.1 mmol/L — ABNORMAL LOW (ref 3.5–5.1)
Sodium: 143 mmol/L (ref 135–145)

## 2022-01-24 LAB — CBC
HCT: 34.3 % — ABNORMAL LOW (ref 39.0–52.0)
Hemoglobin: 11.1 g/dL — ABNORMAL LOW (ref 13.0–17.0)
MCH: 29.1 pg (ref 26.0–34.0)
MCHC: 32.4 g/dL (ref 30.0–36.0)
MCV: 89.8 fL (ref 80.0–100.0)
Platelets: 544 10*3/uL — ABNORMAL HIGH (ref 150–400)
RBC: 3.82 MIL/uL — ABNORMAL LOW (ref 4.22–5.81)
RDW: 15.9 % — ABNORMAL HIGH (ref 11.5–15.5)
WBC: 9.1 10*3/uL (ref 4.0–10.5)
nRBC: 0 % (ref 0.0–0.2)

## 2022-01-24 LAB — MAGNESIUM: Magnesium: 1.7 mg/dL (ref 1.7–2.4)

## 2022-01-24 MED ORDER — CLOPIDOGREL BISULFATE 75 MG PO TABS
75.0000 mg | ORAL_TABLET | Freq: Every day | ORAL | Status: DC
  Administered 2022-01-24 – 2022-01-27 (×3): 75 mg via ORAL
  Filled 2022-01-24 (×4): qty 1

## 2022-01-24 MED ORDER — POTASSIUM CHLORIDE CRYS ER 20 MEQ PO TBCR
40.0000 meq | EXTENDED_RELEASE_TABLET | Freq: Once | ORAL | Status: AC
  Administered 2022-01-24: 40 meq via ORAL
  Filled 2022-01-24: qty 2

## 2022-01-24 MED ORDER — MAGNESIUM SULFATE 2 GM/50ML IV SOLN
2.0000 g | Freq: Once | INTRAVENOUS | Status: AC
  Administered 2022-01-24: 2 g via INTRAVENOUS
  Filled 2022-01-24: qty 50

## 2022-01-24 MED ORDER — POTASSIUM CHLORIDE CRYS ER 20 MEQ PO TBCR
40.0000 meq | EXTENDED_RELEASE_TABLET | Freq: Once | ORAL | Status: AC
Start: 2022-01-24 — End: 2022-01-24
  Administered 2022-01-24: 40 meq via ORAL
  Filled 2022-01-24: qty 2

## 2022-01-24 NOTE — Progress Notes (Signed)
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  On presentation, EMS reported temperature of 102.  In the ED, he was tachycardic, tachypneic with mild leukocytosis and creatinine elevated to 2.82 from prior of 1.96.  CT of abdomen and pelvis showed bibasilar atelectasis with superimposed developing infection/inflammation not excluded with fresh transition state of the colon, no diverticulitis.  He was started on IV fluids and antibiotics.  Blood and urine cultures negative so far.  He required Foley catheter placement for urinary retention  Subjective / 24h Interval events: Still a little abdominal pain, no nausea or vomiting.  No further diarrhea  Assesement and Plan: Principal Problem:   AMS (altered mental status) Active Problems:   Obstructive uropathy   Essential hypertension   BPH (benign prostatic hyperplasia)   Chronic kidney disease, stage 3b (HCC)   AKI (acute kidney injury) ([place])   Hypokalemia   Acute urinary retention   Aortic root dilatation (HCC)   Chronic heart failure with preserved ejection fraction (HFpEF) (HCC)   Expressive aphasia   Sepsis ([place])   History of CVA (cerebrovascular accident)   Acute metabolic encephalopathy   Leukocytosis   Obesity (BMI 30-39.9)   Physical deconditioning   SVT (supraventricular tachycardia) (HCC)   Acute on chronic diastolic heart failure (HCC)   NSVT (nonsustained ventricular tachycardia) (HCC)   Principal problem SIRS, possible sepsis, POA-unclear etiology, there was questionable bacterial pneumonia, infective diarrhea  and concern for UTI.  He had fever, tachycardia, tachypnea and elevated white count on admission.  Cultures have remained negative so far, GI pathogen panel and C. difficile were not run because diarrhea resolved.  Has completed 7 days of empiric antibiotics, now monitored off.  Sugars have remained negative  Active problems Acute metabolic encephalopathy-possibly from fever, sepsis.  Improved, suspect close to baseline.  Still has expressive aphasia from prior stroke.  Leukocytosis-WBC normalized  Normocytic anemia-From anemia of chronic disease. Hemoglobin stable. Monitor  Abdominal distention-overall better.  Allow regular diet  Concern for sigmoid colon mass, concern for lower GI bleed-based on the CT scan done 8/18 and appearance of the stool.  GI consulted, has not had any more bleeding and hemoglobin has remained stable, Plavix was on hold, discussed with Dr. Loletha Carrow and will resume today  Acute kidney injury on CKD stage IIIb -Creatinine 2.83 on presentation; was 1.96 on discharge last month.  Has received IV fluids initially, now discontinued.  Received Lasix 8/14 and 8/15 per cardiology due to fluid overload.  Creatinine stabilized, currently at baseline  History of CVA with residual expressive aphasia and right-sided weakness -Was hospitalized last month because of above and subsequently discharged to SNF.  Patient possibly has residual aphasia with aphasia and right-sided weakness from prior stroke. MRI of brain during this admission was negative for any stroke. Outpatient follow-up with neurology.  Resume Plavix today   SVT -Patient had an episode of SVT with heart rate going up to 170 on 01/18/2022.  Cardiology has been consulted: Continue metoprolol   Chronic diastolic heart failure -Has slightly swollen lower extremities.  Strict input and output.  Daily weights.  Patient received Lasix  8/14, 8/15.  Status better.  Continue p.o. 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 Recommend follow-up ultrasound every 3 years   BPH, acute urinary retention -With lower urinary tract symptoms.  Patient required in and out cath few times and subsequently Foley catheter placed on 01/14/2022.  Flomax started on 01/14/2022.  Failed voiding trial 8/16, he will need to be discharged with a Foley catheter and have outpatient urology follow-up   Essential hypertension -Monitor blood pressure.   Physical deconditioning -patient will have to return back to SNF.  TOC following.  Hypokalemia -replace again today   Obesity -Outpatient follow-up   Chronic knee pain -Follows up with orthopedic/sports medicine as an outpatient for recurrent steroid injections.  Outpatient follow-up with orthopedics.  Scheduled Meds:  Chlorhexidine Gluconate Cloth  6 each Topical Daily   clopidogrel  75 mg Oral Daily   enoxaparin (LOVENOX) injection  40 mg Subcutaneous Q24H   feeding supplement  237 mL Oral BID BM   furosemide  40 mg Oral Daily   metoprolol tartrate  25 mg Oral BID   pantoprazole (PROTONIX) IV  40 mg Intravenous QHS   polyethylene glycol  17 g Oral Daily   potassium chloride  40 mEq Oral Once   senna-docusate  2 tablet Oral BID   tamsulosin  0.4 mg Oral QPC supper   Continuous Infusions:  sodium chloride 10 mL/hr at 01/18/22 1506   PRN Meds:.sodium chloride, acetaminophen, bisacodyl, iohexol, morphine injection, traMADol  Diet Orders (From admission, onward)     Start     Ordered   01/24/22 0939  Diet Heart Room service appropriate? Yes; Fluid consistency: Thin  Diet effective now       Question Answer Comment  Room service appropriate? Yes   Fluid consistency: Thin      01/24/22 0938            DVT prophylaxis: enoxaparin (LOVENOX) injection 40 mg Start: 01/13/22 2230   Lab Results  Component Value Date   PLT 544 (H) 01/24/2022      Code Status: Full  Code  Family Communication: No family at bedside  Status is: Inpatient Remains inpatient appropriate because: Severity of illness   Level of care: Telemetry  Consultants:  cards   Objective: Vitals:   01/23/22 1302 01/23/22 2153 01/24/22 0643 01/24/22 1238  BP: (!) 155/94 (!) 150/93 (!) 136/99 (!) 147/86  Pulse: 82 91 76 79  Resp:  '18 15 20  '$ Temp:  99.5 F (37.5 C) 98.3 F (36.8 C) 98.7 F (37.1 C)  TempSrc:  Oral Oral Oral  SpO2: 100% 94% 98% 99%  Weight:      Height:        Intake/Output Summary (Last 24 hours) at 01/24/2022 1427 Last data filed at 01/24/2022 1300 Gross per 24 hour  Intake 940 ml  Output 1350 ml  Net -410 ml    Wt Readings from Last 3 Encounters:  01/23/22 109.2 kg  12/09/21 117.5 kg  11/30/21 113.6 kg    Examination:  Constitutional: NAD Eyes: lids and conjunctivae normal, no scleral icterus ENMT: mmm Neck: normal, supple Respiratory: clear to auscultation bilaterally, no wheezing, no crackles. Normal respiratory effort.  Cardiovascular: Regular rate and rhythm, no murmurs / rubs / gallops. No LE edema. Abdomen: soft, mildly distended, no tenderness. Bowel sounds positive.  Skin: no rashes Neurologic: no focal deficits, equal strength   Data Reviewed: I  have independently reviewed following labs and imaging studies   CBC Recent Labs  Lab 01/18/22 0456 01/19/22 0657 01/20/22 0744 01/21/22 0551 01/22/22 1327 01/23/22 0537 01/24/22 0521  WBC 15.4* 13.2* 10.1 9.1 9.5 9.4 9.1  HGB 12.1* 11.5* 11.7* 11.5* 11.8* 11.6* 11.1*  HCT 36.5* 35.1* 35.7* 35.5* 36.7* 35.7* 34.3*  PLT 429* 506* 550* 546* 553* 559* 544*  MCV 88.8 88.2 89.0 90.8 89.5 89.7 89.8  MCH 29.4 28.9 29.2 29.4 28.8 29.1 29.1  MCHC 33.2 32.8 32.8 32.4 32.2 32.5 32.4  RDW 15.9* 15.9* 16.0* 16.3* 16.2* 16.0* 15.9*  LYMPHSABS 2.4 2.0 2.0  --   --   --   --   MONOABS 1.2* 1.0 0.9  --   --   --   --   EOSABS 0.1 0.1 0.2  --   --   --   --   BASOSABS 0.1 0.1 0.1  --   --    --   --      Recent Labs  Lab 01/19/22 0657 01/20/22 0744 01/21/22 0555 01/22/22 1326 01/23/22 0537 01/24/22 0521  NA 143 145 144 145 143 143  K 3.2* 3.2* 2.9* 3.7 3.5 3.1*  CL 110 109 111 112* 109 112*  CO2 '25 27 27 26 27 27  '$ GLUCOSE 103* 107* 101* 100* 91 87  BUN 43* 40* 39* 38* 36* 32*  CREATININE 2.17* 2.07* 2.07* 2.14* 1.89* 1.78*  CALCIUM 8.7* 8.7* 8.8* 9.1 8.7* 8.8*  MG 1.9 1.9  --  1.9 1.8 1.7  BNP  --  41.7  --   --   --   --      ------------------------------------------------------------------------------------------------------------------ No results for input(s): "CHOL", "HDL", "LDLCALC", "TRIG", "CHOLHDL", "LDLDIRECT" in the last 72 hours.  Lab Results  Component Value Date   HGBA1C 5.6 12/09/2021   ------------------------------------------------------------------------------------------------------------------ No results for input(s): "TSH", "T4TOTAL", "T3FREE", "THYROIDAB" in the last 72 hours.  Invalid input(s): "FREET3"  Cardiac Enzymes No results for input(s): "CKMB", "TROPONINI", "MYOGLOBIN" in the last 168 hours.  Invalid input(s): "CK" ------------------------------------------------------------------------------------------------------------------    Component Value Date/Time   BNP 41.7 01/20/2022 0744    CBG: No results for input(s): "GLUCAP" in the last 168 hours.  No results found for this or any previous visit (from the past 240 hour(s)).    Radiology Studies: No results found.   Marzetta Board, MD, PhD [facility]  Between 7 am - 7 pm I am available, please contact me via Amion (for emergencies) or Securechat (non urgent messages)  Between 7 pm - 7 am I am not available, please contact night coverage MD/APP via Amion

## 2022-01-24 NOTE — Progress Notes (Addendum)
[facility] Progress Note  Chief Complaint: Ileus  History:  No clinical events since I saw him yesterday.  No reports of further GI bleeding, no nausea vomiting.  Tolerating liquid diet without difficulty.  States he is hungry, denies abdominal pain. He does not think he has passed gas or bowel movement, not clear if that is accurate. ROS: Somewhat limited by his aphasia, but he seems to deny chest pain or dyspnea. Objective:   Current Facility-Administered Medications:    0.9 %  sodium chloride infusion, , Intravenous, PRN, Starla Link, Kshitiz, MD, Last Rate: 10 mL/hr at 01/18/22 1506, Infusion Verify at 01/18/22 1506   acetaminophen (TYLENOL) tablet 650 mg, 650 mg, Oral, Q6H PRN, Starla Link, Kshitiz, MD, 650 mg at 01/19/22 1455   bisacodyl (DULCOLAX) suppository 10 mg, 10 mg, Rectal, Daily PRN, Starla Link, Kshitiz, MD   Chlorhexidine Gluconate Cloth 2 % PADS 6 each, 6 each, Topical, Daily, Alekh, Kshitiz, MD, 6 each at 01/23/22 1048   enoxaparin (LOVENOX) injection 40 mg, 40 mg, Subcutaneous, Q24H, Tu, Ching T, DO, 40 mg at 01/23/22 2225   feeding supplement (ENSURE ENLIVE / ENSURE PLUS) liquid 237 mL, 237 mL, Oral, BID BM, Alekh, Kshitiz, MD, 237 mL at 01/23/22 1755   furosemide (LASIX) tablet 40 mg, 40 mg, Oral, Daily, Donato Heinz, MD, 40 mg at 01/23/22 1048   iohexol (OMNIPAQUE) 300 MG/ML solution 100 mL, 100 mL, Intravenous, Once PRN, Isla Pence, MD   magnesium sulfate IVPB 2 g 50 mL, 2 g, Intravenous, Once, Gherghe, Vella Redhead, MD   metoprolol tartrate (LOPRESSOR) tablet 25 mg, 25 mg, Oral, BID, Cecilie Kicks R, NP, 25 mg at 01/23/22 2218   morphine (PF) 2 MG/ML injection 2 mg, 2 mg, Intravenous, Q2H PRN, Starla Link, Kshitiz, MD, 2 mg at 01/22/22 0158   pantoprazole (PROTONIX) injection 40 mg, 40 mg, Intravenous, QHS, Kennedy-Smith, Colleen M, NP, 40 mg at 01/23/22 2218   polyethylene glycol (MIRALAX / GLYCOLAX) packet 17 g, 17 g, Oral, Daily, Alekh, Kshitiz, MD, 17 g at 01/23/22 1049    potassium chloride SA (KLOR-CON M) CR tablet 40 mEq, 40 mEq, Oral, Once, Gherghe, Costin M, MD   potassium chloride SA (KLOR-CON M) CR tablet 40 mEq, 40 mEq, Oral, Once, Gherghe, Vella Redhead, MD   senna-docusate (Senokot-S) tablet 2 tablet, 2 tablet, Oral, BID, Starla Link, Kshitiz, MD, 2 tablet at 01/23/22 2218   tamsulosin (FLOMAX) capsule 0.4 mg, 0.4 mg, Oral, QPC supper, Alekh, Kshitiz, MD, 0.4 mg at 01/23/22 1752   traMADol (ULTRAM) tablet 50 mg, 50 mg, Oral, Q12H PRN, Starla Link, Kshitiz, MD, 50 mg at 01/23/22 2225   sodium chloride 10 mL/hr at 01/18/22 1506   magnesium sulfate bolus IVPB       Vital signs in last 24 hrs: Vitals:   01/23/22 2153 01/24/22 0643  BP: (!) 150/93 (!) 136/99  Pulse: 91 76  Resp: 18 15  Temp: 99.5 F (37.5 C) 98.3 F (36.8 C)  SpO2: 94% 98%    Intake/Output Summary (Last 24 hours) at 01/24/2022 0933 Last data filed at 01/23/2022 1804 Gross per 24 hour  Intake 760 ml  Output 1051 ml  Net -291 ml     Physical Exam  Cardiac: RRR without murmurs Pulm: clear to auscultation bilaterally, normal RR and effort noted Abdomen: soft, mildly distended without tenderness, with active bowel sounds.  Very active bowel sounds Overall, abdominal exam similar to yesterday. Recent Labs:     Latest Ref Rng & Units 01/24/2022    5:21  AM 01/23/2022    5:37 AM 01/22/2022    1:27 PM  CBC  WBC 4.0 - 10.5 K/uL 9.1  9.4  9.5   Hemoglobin 13.0 - 17.0 g/dL 11.1  11.6  11.8   Hematocrit 39.0 - 52.0 % 34.3  35.7  36.7   Platelets 150 - 400 K/uL 544  559  553     No results for input(s): "INR" in the last 168 hours.    Latest Ref Rng & Units 01/24/2022    5:21 AM 01/23/2022    5:37 AM 01/22/2022    1:26 PM  CMP  Glucose 70 - 99 mg/dL 87  91  100   BUN 8 - 23 mg/dL 32  36  38   Creatinine 0.61 - 1.24 mg/dL 1.78  1.89  2.14   Sodium 135 - 145 mmol/L 143  143  145   Potassium 3.5 - 5.1 mmol/L 3.1  3.5  3.7   Chloride 98 - 111 mmol/L 112  109  112   CO2 22 - 32 mmol/L '27  27   26   '$ Calcium 8.9 - 10.3 mg/dL 8.8  8.7  9.1      Radiologic studies:   Assessment & Plan  Assessment: Colon and small bowel ileus related to acute infectious illness with recent CVA and debility.  Slowly improving. Reported lower GI bleeding, none last 48 hours, Plavix on hold  Plan: Resume Plavix today No current plans for endoscopic procedures. Regular diet (heart healthy with thin liquids, which was the last diet he was on when seen by dietitian 3 days ago) Continue to mobilize him as much as possible, replace potassium Our service will follow.  (D/w Dr. Cruzita Lederer)  Nelida Meuse III Office: [phone]

## 2022-01-25 DIAGNOSIS — B999 Unspecified infectious disease: Secondary | ICD-10-CM | POA: Diagnosis not present

## 2022-01-25 DIAGNOSIS — I471 Supraventricular tachycardia: Secondary | ICD-10-CM | POA: Diagnosis not present

## 2022-01-25 DIAGNOSIS — I4729 Other ventricular tachycardia: Secondary | ICD-10-CM | POA: Diagnosis not present

## 2022-01-25 DIAGNOSIS — K567 Ileus, unspecified: Secondary | ICD-10-CM | POA: Diagnosis not present

## 2022-01-25 DIAGNOSIS — I5033 Acute on chronic diastolic (congestive) heart failure: Secondary | ICD-10-CM | POA: Diagnosis not present

## 2022-01-25 DIAGNOSIS — Z8673 Personal history of transient ischemic attack (TIA), and cerebral infarction without residual deficits: Secondary | ICD-10-CM | POA: Diagnosis not present

## 2022-01-25 LAB — GASTROINTESTINAL PANEL BY PCR, STOOL (REPLACES STOOL CULTURE)

## 2022-01-25 MED ORDER — PANTOPRAZOLE SODIUM 40 MG PO TBEC
40.0000 mg | DELAYED_RELEASE_TABLET | Freq: Every day | ORAL | Status: DC
  Administered 2022-01-25 – 2022-01-27 (×3): 40 mg via ORAL
  Filled 2022-01-25 (×3): qty 1

## 2022-01-25 NOTE — Progress Notes (Signed)
Occupational Therapy Treatment Patient Details Name: [name] MRN: [id number] DOB: 10/14/1950 Today's Date: 01/25/2022   History of present illness 71 y.o. 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  On presentation, EMS reported temperature of 102.  In the ED, he was tachycardic, tachypneic with mild leukocytosis and creatinine elevated. Dx of acute metabolic encephalopathy, possible sepsis (PNA vs infectious diarrhea vs UTI).   OT comments  Patient was noted to have better use of RUE on this date with less edema in BUE noted. Patient was able to engage in reaching and tossing small ball with RUE with no LOB on this date. Patient would continue to benefit from skilled OT services at this time while admitted and after d/c to address noted deficits in order to improve overall safety and independence in ADLs.     Recommendations for follow up therapy are one component of a multi-disciplinary discharge planning process, led by the attending physician.  Recommendations may be updated based on patient status, additional functional criteria and insurance authorization.    Follow Up Recommendations  Skilled nursing-short term rehab (<3 hours/day)    Assistance Recommended at Discharge Frequent or constant Supervision/Assistance  Patient can return home with the following  Two people to help with walking and/or transfers;Two people to help with bathing/dressing/bathroom;Direct supervision/assist for medications management;Help with stairs or ramp for entrance;Assist for transportation;Direct supervision/assist for financial management;Assistance with cooking/housework;Assistance with feeding   Equipment Recommendations       Recommendations for  Other Services      Precautions / Restrictions Precautions Precautions: Fall Precaution Comments: severe knee pain, extends when attempting to stand. Restrictions Weight Bearing Restrictions: No       Mobility Bed Mobility Overal bed mobility: Needs Assistance Bed Mobility: Supine to Sit, Sit to Supine, Rolling Rolling: Min assist, +2 for physical assistance   Supine to sit: HOB elevated, Supervision Sit to supine: +2 for physical assistance, Mod assist   General bed mobility comments: Rolling: pt required min assist +2 for rolling R & L for pericare, NT present to assist. Supine to Sit: supervision with Incr time d/t pain. pt is able to self assist RLE using BUE, with incr time able to self assist with trunk,grimacing in pain. Sit to supine: mod assist +2 for bringing BLE into bed and controlling trunk lowering to bed, pt grimacing in pain. Pt able to self-scoot up in bed using BUE and BLE with moderate multimodal cuing.    Transfers                         Balance Overall balance assessment: Needs assistance Sitting-balance support: Feet supported, No upper extremity supported Sitting balance-Leahy Scale: Fair         Standing balance comment: unable to progress past 1 inch off bed                           ADL either performed or assessed with clinical judgement   ADL Overall ADL's : Needs assistance/impaired     Grooming: Wash/dry face;Set up;Sitting Grooming Details (indicate cue type and reason): EOB with increased time and min guard  Toilet Transfer Details (indicate cue type and reason): patient attempted standing with inability to clear bottom off bed usign recliner and HH assistance TD. patient attempted x3 with about 1 inch off matress.           General ADL Comments: patient was able to keep sitting balance with min guard for ball toss using RUE to increase ROM of RUE. patient was able to participate in task  with increased time with better ac uracy crossing midline with increased time.    Extremity/Trunk Assessment              Vision       Perception     Praxis      Cognition Arousal/Alertness: Awake/alert Behavior During Therapy: WFL for tasks assessed/performed Overall Cognitive Status: No family/caregiver present to determine baseline cognitive functioning                                 General Comments: patient knew to continue to ask for NT at start of session to get what he was asking for        Exercises      Shoulder Instructions       General Comments      Pertinent Vitals/ Pain       Pain Assessment Pain Assessment: Faces Faces Pain Scale: Hurts even more Pain Location: abdomen, BLE Pain Descriptors / Indicators: Grimacing, Discomfort, Moaning, Guarding Pain Intervention(s): Limited activity within patient's tolerance, Monitored during session, Repositioned  Home Living                                          Prior Functioning/Environment              Frequency  Min 2X/week        Progress Toward Goals  OT Goals(current goals can now be found in the care plan section)  Progress towards OT goals: Progressing toward goals     Plan Discharge plan remains appropriate    Co-evaluation    PT/OT/SLP Co-Evaluation/Treatment: Yes Reason for Co-Treatment: To address functional/ADL transfers;Complexity of the patient's impairments (multi-system involvement);For patient/therapist safety PT goals addressed during session: Mobility/safety with mobility OT goals addressed during session: ADL's and self-care      AM-PAC OT "6 Clicks" Daily Activity     Outcome Measure   Help from another person eating meals?: A Little Help from another person taking care of personal grooming?: A Little Help from another person toileting, which includes using toliet, bedpan, or urinal?: Total Help from another person bathing  (including washing, rinsing, drying)?: Total Help from another person to put on and taking off regular upper body clothing?: A Lot Help from another person to put on and taking off regular lower body clothing?: Total 6 Click Score: 11    End of Session Equipment Utilized During Treatment: Gait belt  OT Visit Diagnosis: Unsteadiness on feet (R26.81);Other abnormalities of gait and mobility (R26.89);Pain;Muscle weakness (generalized) (M62.81)   Activity Tolerance Patient limited by pain   Patient Left in bed;with call bell/phone within reach;with bed alarm set   Nurse Communication Other (comment) (ok to participate in session)        Time: 8338-2505 OT Time Calculation (min): 35 min  Charges: OT General Charges $OT Visit: 1 Visit OT Treatments $Self Care/Home Management : 8-22  mins  Jackelyn Poling OTR/L, [place] [facility] Office# [phone] Pager# [phone]   Marcellina Millin 01/25/2022, 3:31 PM

## 2022-01-25 NOTE — Progress Notes (Addendum)
Daily Progress Note  Hospital Day: 52  Chief Complaint:   Brief History [name] is a 71 y.o. 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 Admitted earlier this month with fever, AMS and abdominal pain. We saw him in consult 8/18 for evaluation of blood in stool ( on plavix) and NCCT showing moderate sigmoid colitis vs mass with distention.    Assessment / Plan   # Abnormal CT scan  showing sigmoid colitis vrs mass with distention. Most likely diffuse ileus Dr. Loletha Carrow had reviewed images a couple of days ago and felt findings unlikely due to neoplasm / obstruction but rather an ileus likely related to recent illnesses / debility.  8/19 KUB shows improved gaseous distention / resolving ileus. Patient continues to have lower abdominal discomfort but clinically things are otherwise improving. No vomiting, having BMs (liquid), abdomen soft with + bowel sounds  Hypokalemia being treated by [facility] Previously ordered stool studies are pending Tolerating regular diet Continue senna BID, daily miralax  # ? GI bleed. No evidence for ongoing bleeding.  Hgb stable at 11.1 though overall it is down from baseline of 13-15 as recently as July 2023.  Per nursing he had a loose, brown stool on night shift.  Plavix resumed yesterday Continue PPI, will change to PO Monitor for bleeding  # SIS , possible sepsis. Unclear etiology.  Stool studies are pending. He completed 7 days of antibiotics.   # Recent CVA / expressive aphasia.   # Normocytic anemia.  Hgb was 13-15 as recent as July 2023. Now at 11.1. Stable overall for now.  Monitor for bleeding   Subjective   No vomiting. Tolerated breakfast. Having loose, non-bloody BM. Does have generalized lower abdominal discomfort  Objective    Endoscopic studies:   2016 colonoscopy  There was mild diverticulosis noted throughout the entire examined colon 2. Internal hemorrhoids 3. The colon was redundant 4. The examination was otherwise normal    Imaging:  DG Abd Portable 1V  Result Date: 01/23/2022 CLINICAL DATA:  Possible ileus EXAM: PORTABLE ABDOMEN - 1 VIEW COMPARISON:  01/21/2022 FINDINGS: Left-lateral abdomen is excluded from the field of view. Improved gaseous distension of the small bowel and colon. No bowel dilatation to suggest obstruction. No evidence of pneumoperitoneum, portal venous gas or pneumatosis. No pathologic calcifications along the expected course of the ureters. No acute osseous abnormality. IMPRESSION: 1. Improved gaseous distension of the small bowel and colon likely reflecting a resolving ileus. Electronically Signed   By: Kathreen Devoid M.D.   On: 01/23/2022 09:04   CT ABDOMEN PELVIS WO CONTRAST  Result Date: 01/22/2022 CLINICAL DATA:  Abdominal pain. EXAM: CT ABDOMEN AND PELVIS WITHOUT CONTRAST TECHNIQUE: Multidetector CT imaging of the abdomen and pelvis was performed following the standard protocol without IV contrast. RADIATION DOSE REDUCTION: This exam was performed according to the departmental dose-optimization program which includes automated exposure control, adjustment of the mA and/or kV according to patient size and/or use of iterative reconstruction technique. COMPARISON:  January 13, 2022. FINDINGS: Lower chest: No acute abnormality. Hepatobiliary: No focal liver abnormality is seen. No gallstones, gallbladder wall thickening, or biliary dilatation. Pancreas: Unremarkable. No pancreatic ductal dilatation or surrounding inflammatory changes. Spleen: Status post splenectomy. Adrenals/Urinary Tract: Adrenal glands appear normal. Severe right renal atrophy is noted. Stable left renal cysts  are noted. No further follow-up is required. No hydronephrosis or renal obstruction is noted. Urinary bladder is  decompressed secondary to Foley catheter. Stomach/Bowel: Stomach is unremarkable. The appendix is unremarkable. No small bowel dilatation is noted. Mildly distended and air-filled colon is noted. There is moderate wall thickening of the proximal sigmoid colon which may represent focal inflammation, but neoplasm cannot be excluded. Vascular/Lymphatic: Aortic atherosclerosis. No enlarged abdominal or pelvic lymph nodes. Reproductive: Stable mild prostatic enlargement. Other: No ascites is noted. Postsurgical changes are seen involving the anterior abdominal wall consistent with prior hernia repair. Small fat containing periumbilical hernia is noted. Musculoskeletal: No acute or significant osseous findings. IMPRESSION: Mildly distended and air-filled colon is noted as well as the rectum. There is noted moderate wall thickening of the proximal sigmoid colon which may represent focal inflammation, but neoplasm cannot be excluded. Sigmoidoscopy is recommended for further evaluation. Severe right renal atrophy is noted. Stable left renal cysts are noted for which no further follow-up is required. Hydronephrosis or renal obstruction is noted. Urinary bladder is decompressed secondary to Foley catheter. Status post splenectomy. Surgical changes are seen involving anterior abdominal wall consistent with prior hernia repair. Aortic Atherosclerosis (ICD10-I70.0). Electronically Signed   By: Marijo Conception M.D.   On: 01/22/2022 12:54   DG Abd Portable 2V  Result Date: 01/21/2022 CLINICAL DATA:  Abdominal pain EXAM: PORTABLE ABDOMEN - 2 VIEW COMPARISON:  Abdomen radiograph done on 12/09/2021 and CT done on 01/13/2022 FINDINGS: There is moderate to marked gaseous distention of colon. Ascending colon measures 10 cm in diameter. Surgical clips are noted in epigastrium and left upper quadrant. There is interval decrease in amount of stool in colon. IMPRESSION: There is moderate to marked gaseous distention of colon suggesting  possible ileus. There is no significant small bowel dilation. Electronically Signed   By: Elmer Picker M.D.   On: 01/21/2022 13:00    Lab Results: Recent Labs    01/22/22 1327 01/23/22 0537 01/24/22 0521  WBC 9.5 9.4 9.1  HGB 11.8* 11.6* 11.1*  HCT 36.7* 35.7* 34.3*  PLT 553* 559* 544*   BMET Recent Labs    01/22/22 1326 01/23/22 0537 01/24/22 0521  NA 145 143 143  K 3.7 3.5 3.1*  CL 112* 109 112*  CO2 '26 27 27  '$ GLUCOSE 100* 91 87  BUN 38* 36* 32*  CREATININE 2.14* 1.89* 1.78*  CALCIUM 9.1 8.7* 8.8*   LFT No results for input(s): "PROT", "ALBUMIN", "AST", "ALT", "ALKPHOS", "BILITOT", "BILIDIR", "IBILI" in the last 72 hours. PT/INR No results for input(s): "LABPROT", "INR" in the last 72 hours.   Scheduled inpatient medications:   Chlorhexidine Gluconate Cloth  6 each Topical Daily   clopidogrel  75 mg Oral Daily   enoxaparin (LOVENOX) injection  40 mg Subcutaneous Q24H   feeding supplement  237 mL Oral BID BM   furosemide  40 mg Oral Daily   metoprolol tartrate  25 mg Oral BID   pantoprazole (PROTONIX) IV  40 mg Intravenous QHS   polyethylene glycol  17 g Oral Daily   senna-docusate  2 tablet Oral BID   tamsulosin  0.4 mg Oral QPC supper   Continuous inpatient infusions:   sodium chloride 10 mL/hr at 01/18/22 1506   PRN inpatient medications: sodium chloride, acetaminophen, bisacodyl, iohexol, morphine injection, traMADol  Vital signs in last 24 hours: Temp:  [98.7 F (37.1 C)-100 F (37.8 C)] 99.4 F (37.4 C) (08/21 0348) Pulse Rate:  [79-87] 81 (08/21 0348) Resp:  [  20-21] 21 (08/21 0348) BP: (141-147)/(77-86) 141/77 (08/21 0348) SpO2:  [95 %-99 %] 95 % (08/21 0348) Weight:  [104.9 kg] 104.9 kg (08/21 0512) Last BM Date : 01/22/22  Intake/Output Summary (Last 24 hours) at 01/25/2022 0945 Last data filed at 01/25/2022 0933 Gross per 24 hour  Intake 520 ml  Output 1200 ml  Net -680 ml    Intake/Output from previous day: 08/20 0701 - 08/21  0700 In: 880 [P.O.:880] Out: 1900 [Urine:1900] Intake/Output this shift: Total I/O In: 120 [P.O.:120] Out: -    Physical Exam:  General: Alert male in NAD Heart:  Regular rate and rhythm. a Pulmonary: Normal respiratory effort Abdomen: Soft but protuberant, nontender, active bowel sounds.  Neurologic: Alert and oriented Psych: Pleasant. Cooperative.    Principal Problem:   AMS (altered mental status) Active Problems:   Essential hypertension   BPH (benign prostatic hyperplasia)   Chronic kidney disease, stage 3b (HCC)   AKI (acute kidney injury) ([place])   Obstructive uropathy   Hypokalemia   Acute urinary retention   Aortic root dilatation (HCC)   Chronic heart failure with preserved ejection fraction (HFpEF) (HCC)   Expressive aphasia   Sepsis ([place])   History of CVA (cerebrovascular accident)   Acute metabolic encephalopathy   Leukocytosis   Obesity (BMI 30-39.9)   Physical deconditioning   SVT (supraventricular tachycardia) (HCC)   Acute on chronic diastolic heart failure (HCC)   NSVT (nonsustained ventricular tachycardia) ([place])     LOS: 11 days   Tye Savoy ,NP 01/25/2022, 9:45 AM

## 2022-01-25 NOTE — TOC Progression Note (Signed)
Transition of Care [facility]) - Progression Note    Patient Details  Name: [name] MRN: [id number] Date of Birth: [birth year]  [facility] CM/SW [place], LCSW Phone Number: 01/25/2022, 10:42 AM  Clinical Narrative:    CSW spoke with [facility] and confirmed pt is able to return to their facility for LTC.   Expected Discharge Plan: Skilled Nursing Facility Barriers to Discharge: Continued Medical Work up  Expected Discharge Plan and Services Expected Discharge Plan: [place] In-house Referral: Clinical Social Work Discharge Planning Services: CM Consult Post Acute Care Choice: [place] Living arrangements for the past 2 months: [place]                 DME Arranged: N/A DME Agency: NA                   Social Determinants of Health (SDOH) Interventions    Readmission Risk Interventions    10/22/2019   12:04 PM  Readmission Risk Prevention Plan  Transportation Screening Complete  PCP or Specialist Appt within 3-5 Days Not Complete  Not Complete comments pending medical stability  HRI or Home Care Consult Complete  Social Work Consult for [place] Planning/Counseling Complete  Palliative Care Screening Not Applicable  Medication Review [employer]) Referral to Pharmacy

## 2022-01-25 NOTE — Plan of Care (Signed)

## 2022-01-25 NOTE — Progress Notes (Signed)
Physical Therapy Treatment Patient Details Name: [name] MRN: [id number] DOB: 10-31-1950 Today's Date: 01/25/2022   History of Present Illness 71 y.o. 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  On presentation, EMS reported temperature of 102.  In the ED, he was tachycardic, tachypneic with mild leukocytosis and creatinine elevated. Dx of acute metabolic encephalopathy, possible sepsis (PNA vs infectious diarrhea vs UTI).    PT Comments    Pt seen in co-treat session with OT secondary to +2 mobility status and complexity of impairments. Focus of PT session was BLE range of motion exercises especially ankle pumps to assist with edema, pt able to perform all BLE actively with only verbal cuing, no physical assist provided. Pt remains supervision for supine to sit, min assist +2 for rolling for pericare, and mod assist +2 for returning to supine position in bed. Pt sat EOB for 20+min for BLE and BUE exercises outside of BOS. Attempted sit to stand transfer with total assist +2 and use of recliner back for BUE support but pt reporting increased pain and inability to support weight on RLE, after 2 trials discontinued. Pt is making slow, incremental progress. Discharge destination remains appropriate, we will continue to follow acutely.     Recommendations for follow up therapy are one component of a multi-disciplinary discharge planning process, led by the attending physician.  Recommendations may be updated based on patient status, additional functional criteria and insurance authorization.  Follow Up Recommendations  Skilled nursing-short term rehab (<3 hours/day) Can patient physically be transported by private vehicle: No   Assistance  Recommended at Discharge Frequent or constant Supervision/Assistance  Patient can return home with the following Two people to help with walking and/or transfers;Two people to help with bathing/dressing/bathroom;Assistance with cooking/housework;Direct supervision/assist for medications management;Assist for transportation;Help with stairs or ramp for entrance;Direct supervision/assist for financial management   Equipment Recommendations  None recommended by PT    Recommendations for Other Services OT consult     Precautions / Restrictions Precautions Precautions: Fall Precaution Comments: severe knee pain, extends when attempting to stand. Restrictions Weight Bearing Restrictions: No     Mobility  Bed Mobility Overal bed mobility: Needs Assistance Bed Mobility: Supine to Sit, Sit to Supine, Rolling Rolling: Min assist, +2 for physical assistance   Supine to sit: HOB elevated, Supervision Sit to supine: +2 for physical assistance, Mod assist   General bed mobility comments: Rolling: pt required min assist +2 for rolling R & L for pericare, NT present to assist. Supine to Sit: supervision with Incr time d/t pain. pt is able to self assist RLE using BUE, with incr time able to self assist with trunk,grimacing in pain. Sit to supine: mod assist +2 for bringing BLE into bed and controlling trunk lowering to bed, pt grimacing in pain. Pt able to self-scoot up in bed using BUE and BLE with moderate multimodal cuing.    Transfers Overall transfer level: Needs assistance Equipment used: 2 person hand held assist (with recliner) Transfers: Sit to/from Stand Sit to Stand: From elevated surface, Total assist, +2 physical assistance, +2 safety/equipment           General transfer comment: Attempted sit to stand transfer from elevated surface with total assist +2, max encouragement, and BUE on recliner back. Pt unable  to lift bottom off bed despite two attempts, reporting increased pain,  transfer discontinued.    Ambulation/Gait               General Gait Details: unsafe at present   Stairs             Wheelchair Mobility    Modified Rankin (Stroke Patients Only)       Balance Overall balance assessment: Needs assistance Sitting-balance support: Feet supported, No upper extremity supported Sitting balance-Leahy Scale: Fair Sitting balance - Comments: Pt required min assist for first 71mn to provide correction of very mild posterior lean, but for the remaining ~249m no assist at all. Postural control: Posterior lean     Standing balance comment: unable at present                            Cognition Arousal/Alertness: Awake/alert Behavior During Therapy[facility]/performed Overall Cognitive Status: No family/caregiver present to determine baseline cognitive functioning                                 General Comments: patient reported 2027 for year and april 27th for day of week, patient also noted to speak in different languages at times during session.        Exercises General Exercises - Lower Extremity Ankle Circles/Pumps: Both, 10 reps, AROM, Supine Long Arc Quad: Both, Seated, AROM, Other reps (comment) (6 reps) Other Exercises Other Exercises: Pt sat EOB for 20+ min to performseated BUE and BLE exercises outside of BOS    General Comments        Pertinent Vitals/Pain Pain Assessment Pain Assessment: Faces Faces Pain Scale: Hurts even more Breathing: normal Negative Vocalization: none Facial Expression: smiling or inexpressive Body Language: relaxed Consolability: no need to console PAINAD Score: 0 Pain Location: abdomen, BLE Pain Descriptors / Indicators: Grimacing, Moaning, Discomfort, Crying Pain Intervention(s): Limited activity within patient's tolerance, Monitored during session, Repositioned    Home Living                          Prior Function             PT Goals (current goals can now be found in the care plan section) Acute Rehab PT Goals Patient Stated Goal: none stated PT Goal Formulation: With patient Time For Goal Achievement: 01/29/22 Potential to Achieve Goals: Fair Progress towards PT goals: Progressing toward goals (very slowly, unable to transition to OOB)    Frequency    Min 2X/week      PT Plan Current plan remains appropriate    Co-evaluation PT/OT/SLP Co-Evaluation/Treatment: Yes Reason for Co-Treatment: Complexity of the patient's impairments (multi-system involvement);Necessary to address cognition/behavior during functional activity PT goals addressed during session: Mobility/safety with mobility;Balance;Strengthening/ROM        AM-PAC PT "6 Clicks" Mobility   Outcome Measure  Help needed turning from your back to your side while in a flat bed without using bedrails?: A Lot Help needed moving from lying on your back to sitting on the side of a flat bed without using bedrails?: A Little Help needed moving to and from a bed to a chair (including a wheelchair)?: Total Help needed standing up from a chair using your arms (e.g., wheelchair or bedside chair)?: Total Help needed to walk in hospital room?: Total Help needed climbing  3-5 steps with a railing? : Total 6 Click Score: 9    End of Session Equipment Utilized During Treatment: Gait belt Activity Tolerance: Patient limited by fatigue;Patient limited by pain Patient left: in bed;with call bell/phone within reach;with bed alarm set Nurse Communication: Mobility status PT Visit Diagnosis: Difficulty in walking, not elsewhere classified (R26.2);Pain;Hemiplegia and hemiparesis Hemiplegia - Right/Left: Right Pain - Right/Left: Right (bil R>L) Pain - part of body: Leg (abdomen)     Time: 1610-9604 PT Time Calculation (min) (ACUTE ONLY): 39 min  Charges:  $Therapeutic Exercise: 8-22 mins $Therapeutic Activity: 8-22 mins                     Coolidge Breeze, PT, DPT [facility] Department Office: [phone] Pager: [phone]   Coolidge Breeze 01/25/2022, 3:25 PM

## 2022-01-25 NOTE — Care Management Important Message (Signed)
Important Message  Patient Details IM Letter placed in Patients room. Name: [name] MRN: [id number] Date of Birth: November 27, 1950   Medicare Important Message Given:  Yes     Kerin Salen 01/25/2022, 11:03 AM

## 2022-01-25 NOTE — Progress Notes (Signed)
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  On presentation, EMS reported temperature of 102.  In the ED, he was tachycardic, tachypneic with mild leukocytosis and creatinine elevated to 2.82 from prior of 1.96.  CT of abdomen and pelvis showed bibasilar atelectasis with superimposed developing infection/inflammation not excluded with fresh transition state of the colon, no diverticulitis.  He was started on IV fluids and antibiotics.  Blood and urine cultures negative so far.  He required Foley catheter placement for urinary retention  Subjective / 24h Interval events: Complains of mild abdominal distention. Having BMs, no nausea/vomiting.   Assesement and Plan: Principal Problem:   AMS (altered mental status) Active Problems:   Obstructive uropathy   Essential hypertension   BPH (benign prostatic hyperplasia)   Chronic kidney disease, stage 3b (HCC)   AKI (acute kidney injury) ([place])   Hypokalemia   Acute urinary retention   Aortic root dilatation (HCC)   Chronic heart failure with preserved ejection fraction (HFpEF) (HCC)   Expressive aphasia   Sepsis ([place])   History of CVA (cerebrovascular accident)   Acute metabolic encephalopathy   Leukocytosis   Obesity (BMI 30-39.9)   Physical deconditioning   SVT (supraventricular tachycardia) (HCC)   Acute on chronic diastolic heart failure (HCC)   NSVT (nonsustained ventricular tachycardia) (HCC)   Principal problem SIRS, possible sepsis, POA-unclear etiology, there was questionable bacterial pneumonia, infective diarrhea  and concern for UTI.  He had fever, tachycardia, tachypnea and elevated white count on admission.  Cultures have remained negative so far, GI pathogen panel and C. difficile were not run because diarrhea resolved.  Has completed 7 days of empiric antibiotics, now monitored off.  Sugars have remained negative  Active problems Acute metabolic encephalopathy-possibly from fever, sepsis. Improved, suspect close to baseline.  Still has expressive aphasia from prior stroke.  Leukocytosis-WBC normalized  Normocytic anemia-From anemia of chronic disease. Hemoglobin stable. Monitor  Abdominal distention-overall better.  Allow regular diet  Concern for sigmoid colon mass, concern for lower GI bleed-based on the CT scan done 8/18 and appearance of the stool.  GI consulted, has not had any more bleeding and hemoglobin has remained stable, Plavix was on hold, now resumed on 8/20.  If no further bleed, could potentially discharge tomorrow.  GI reviewed the scan, they are not as concerned about the sigmoid colon mass, especially that the CT scan the week prior did not reveal that  Acute kidney injury on CKD stage IIIb -Creatinine 2.83 on presentation; was 1.96 on discharge last month.  Has received IV fluids initially, now discontinued.  Received Lasix 8/14 and 8/15 per cardiology due to fluid overload.  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  Patient possibly has residual aphasia with aphasia and right-sided weakness from prior stroke. MRI of brain during this admission was negative for any stroke. Outpatient follow-up with neurology.  Continue Plavix   SVT -Patient had an episode of SVT with heart rate going up to 170 on 01/18/2022.  Cardiology has been consulted: Continue metoprolol   Chronic diastolic heart failure -Has slightly swollen lower  extremities.  Strict input and output.  Daily weights.  Patient received Lasix 8/14, 8/15.  Status better.  Continue p.o. 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 Recommend follow-up ultrasound every 3 years   BPH, acute urinary retention -With lower urinary tract symptoms.  Patient required in and out cath few times and subsequently Foley catheter placed on 01/14/2022.  Flomax started on 01/14/2022.  Failed voiding trial 8/16, he will need to be discharged with a Foley catheter and have outpatient urology follow-up   Essential hypertension -Monitor blood pressure.   Physical deconditioning -patient will have to return back to SNF.  TOC following.  Hypokalemia -replace again today   Obesity -Outpatient follow-up   Chronic knee pain -Follows up with orthopedic/sports medicine as an outpatient for recurrent steroid injections.  Outpatient follow-up with orthopedics.  Scheduled Meds:  Chlorhexidine Gluconate Cloth  6 each Topical Daily   clopidogrel  75 mg Oral Daily   enoxaparin (LOVENOX) injection  40 mg Subcutaneous Q24H   feeding supplement  237 mL Oral BID BM   furosemide  40 mg Oral Daily   metoprolol tartrate  25 mg Oral BID   pantoprazole  40 mg Oral Daily   polyethylene glycol  17 g Oral Daily   senna-docusate  2 tablet Oral BID   tamsulosin  0.4 mg Oral QPC supper   Continuous Infusions:  sodium chloride 10 mL/hr at 01/18/22 1506   PRN Meds:.sodium chloride, acetaminophen, bisacodyl, iohexol, morphine injection, traMADol  Diet Orders (From admission, onward)     Start     Ordered   01/24/22 0939  Diet Heart Room service appropriate? Yes; Fluid consistency: Thin  Diet effective now       Question Answer Comment  Room service appropriate? Yes   Fluid consistency: Thin      01/24/22 0938            DVT prophylaxis: enoxaparin (LOVENOX) injection 40 mg  Start: 01/13/22 2230   Lab Results  Component Value Date   PLT 544 (H) 01/24/2022      Code Status: Full Code  Family Communication: No family at bedside  Status is: Inpatient Remains inpatient appropriate because: Severity of illness   Level of care: Telemetry  Consultants:  cards   Objective: Vitals:   01/24/22 1958 01/25/22 0348 01/25/22 0512 01/25/22 1005  BP: (!) 143/86 (!) 141/77  (!) 120/92  Pulse: 87 81  82  Resp: 20 (!) 21  16  Temp: 100 F (37.8 C) 99.4 F (37.4 C)  97.8 F (36.6 C)  TempSrc:  Oral  Oral  SpO2: 97% 95%  97%  Weight:   104.9 kg   Height:        Intake/Output Summary (Last 24 hours) at 01/25/2022 1132 Last data filed at 01/25/2022 0933 Gross per 24 hour  Intake 520 ml  Output 1200 ml  Net -680 ml    Wt Readings from Last 3 Encounters:  01/25/22 104.9 kg  12/09/21 117.5 kg  11/30/21 113.6 kg    Examination:  Constitutional: NAD Eyes: lids and conjunctivae normal, no scleral icterus ENMT: mmm Neck: normal, supple Respiratory: clear to auscultation bilaterally, no wheezing, no crackles. Normal respiratory effort.  Cardiovascular: Regular rate and rhythm, no murmurs / rubs / gallops. No LE edema. Abdomen: soft, no distention, no  tenderness. Bowel sounds positive.  Skin: no rashes Neurologic: no new focal deficits   Data Reviewed: I have independently reviewed following labs and imaging studies   CBC Recent Labs  Lab 01/19/22 0657 01/20/22 0744 01/21/22 0551 01/22/22 1327 01/23/22 0537 01/24/22 0521  WBC 13.2* 10.1 9.1 9.5 9.4 9.1  HGB 11.5* 11.7* 11.5* 11.8* 11.6* 11.1*  HCT 35.1* 35.7* 35.5* 36.7* 35.7* 34.3*  PLT 506* 550* 546* 553* 559* 544*  MCV 88.2 89.0 90.8 89.5 89.7 89.8  MCH 28.9 29.2 29.4 28.8 29.1 29.1  MCHC 32.8 32.8 32.4 32.2 32.5 32.4  RDW 15.9* 16.0* 16.3* 16.2* 16.0* 15.9*  LYMPHSABS 2.0 2.0  --   --   --   --   MONOABS 1.0 0.9  --   --   --   --   EOSABS 0.1 0.2  --   --   --   --   BASOSABS 0.1  0.1  --   --   --   --      Recent Labs  Lab 01/19/22 0657 01/20/22 0744 01/21/22 0555 01/22/22 1326 01/23/22 0537 01/24/22 0521  NA 143 145 144 145 143 143  K 3.2* 3.2* 2.9* 3.7 3.5 3.1*  CL 110 109 111 112* 109 112*  CO2 '25 27 27 26 27 27  '$ GLUCOSE 103* 107* 101* 100* 91 87  BUN 43* 40* 39* 38* 36* 32*  CREATININE 2.17* 2.07* 2.07* 2.14* 1.89* 1.78*  CALCIUM 8.7* 8.7* 8.8* 9.1 8.7* 8.8*  MG 1.9 1.9  --  1.9 1.8 1.7  BNP  --  41.7  --   --   --   --      ------------------------------------------------------------------------------------------------------------------ No results for input(s): "CHOL", "HDL", "LDLCALC", "TRIG", "CHOLHDL", "LDLDIRECT" in the last 72 hours.  Lab Results  Component Value Date   HGBA1C 5.6 12/09/2021   ------------------------------------------------------------------------------------------------------------------ No results for input(s): "TSH", "T4TOTAL", "T3FREE", "THYROIDAB" in the last 72 hours.  Invalid input(s): "FREET3"  Cardiac Enzymes No results for input(s): "CKMB", "TROPONINI", "MYOGLOBIN" in the last 168 hours.  Invalid input(s): "CK" ------------------------------------------------------------------------------------------------------------------    Component Value Date/Time   BNP 41.7 01/20/2022 0744    CBG: No results for input(s): "GLUCAP" in the last 168 hours.  No results found for this or any previous visit (from the past 240 hour(s)).    Radiology Studies: No results found.   Marzetta Board, MD, PhD [facility]  Between 7 am - 7 pm I am available, please contact me via Amion (for emergencies) or Securechat (non urgent messages)  Between 7 pm - 7 am I am not available, please contact night coverage MD/APP via Amion

## 2022-01-26 ENCOUNTER — Encounter (HOSPITAL_COMMUNITY)
Admission: EM | Disposition: A | Discharge status: Skilled Nursing Facility | Payer: Self-pay | Source: Home / Self Care | Attending: Internal Medicine

## 2022-01-26 ENCOUNTER — Encounter (HOSPITAL_COMMUNITY): Payer: Self-pay | Admitting: Internal Medicine

## 2022-01-26 ENCOUNTER — Inpatient Hospital Stay (HOSPITAL_COMMUNITY): Payer: Medicare Other | Admitting: Anesthesiology

## 2022-01-26 DIAGNOSIS — K648 Other hemorrhoids: Secondary | ICD-10-CM | POA: Diagnosis not present

## 2022-01-26 DIAGNOSIS — K5730 Diverticulosis of large intestine without perforation or abscess without bleeding: Secondary | ICD-10-CM

## 2022-01-26 DIAGNOSIS — R410 Disorientation, unspecified: Secondary | ICD-10-CM | POA: Diagnosis not present

## 2022-01-26 DIAGNOSIS — Z87891 Personal history of nicotine dependence: Secondary | ICD-10-CM

## 2022-01-26 DIAGNOSIS — K625 Hemorrhage of anus and rectum: Secondary | ICD-10-CM

## 2022-01-26 DIAGNOSIS — I10 Essential (primary) hypertension: Secondary | ICD-10-CM

## 2022-01-26 HISTORY — PX: FLEXIBLE SIGMOIDOSCOPY: SHX5431

## 2022-01-26 LAB — BASIC METABOLIC PANEL
Anion gap: 6 (ref 5–15)
BUN: 26 mg/dL — ABNORMAL HIGH (ref 8–23)
CO2: 26 mmol/L (ref 22–32)
Calcium: 8.5 mg/dL — ABNORMAL LOW (ref 8.9–10.3)
Chloride: 108 mmol/L (ref 98–111)
Creatinine, Ser: 1.67 mg/dL — ABNORMAL HIGH (ref 0.61–1.24)
GFR, Estimated: 43 mL/min — ABNORMAL LOW (ref >60)
Glucose, Bld: 106 mg/dL — ABNORMAL HIGH (ref 70–99)
Potassium: 3 mmol/L — ABNORMAL LOW (ref 3.5–5.1)
Sodium: 140 mmol/L (ref 135–145)

## 2022-01-26 LAB — CBC
HCT: 33.2 % — ABNORMAL LOW (ref 39.0–52.0)
Hemoglobin: 10.8 g/dL — ABNORMAL LOW (ref 13.0–17.0)
MCH: 29.2 pg (ref 26.0–34.0)
MCHC: 32.5 g/dL (ref 30.0–36.0)
MCV: 89.7 fL (ref 80.0–100.0)
Platelets: 494 10*3/uL — ABNORMAL HIGH (ref 150–400)
RBC: 3.7 MIL/uL — ABNORMAL LOW (ref 4.22–5.81)
RDW: 15.8 % — ABNORMAL HIGH (ref 11.5–15.5)
WBC: 8.2 10*3/uL (ref 4.0–10.5)
nRBC: 0 % (ref 0.0–0.2)

## 2022-01-26 SURGERY — SIGMOIDOSCOPY, FLEXIBLE
Anesthesia: Monitor Anesthesia Care

## 2022-01-26 MED ORDER — HYDROCORTISONE ACETATE 25 MG RE SUPP
25.0000 mg | Freq: Every day | RECTAL | Status: DC
  Administered 2022-01-26: 25 mg via RECTAL
  Filled 2022-01-26: qty 1

## 2022-01-26 MED ORDER — FLEET ENEMA 7-19 GM/118ML RE ENEM
ENEMA | RECTAL | Status: AC
  Filled 2022-01-26: qty 2

## 2022-01-26 MED ORDER — POTASSIUM CHLORIDE CRYS ER 20 MEQ PO TBCR
40.0000 meq | EXTENDED_RELEASE_TABLET | ORAL | Status: AC
  Filled 2022-01-26: qty 2

## 2022-01-26 MED ORDER — SODIUM CHLORIDE 0.9 % IV SOLN: INTRAVENOUS | Status: DC

## 2022-01-26 MED ORDER — PROPOFOL 1000 MG/100ML IV EMUL
INTRAVENOUS | Status: AC
  Filled 2022-01-26: qty 100

## 2022-01-26 MED ORDER — FLEET ENEMA 7-19 GM/118ML RE ENEM
2.0000 | ENEMA | Freq: Once | RECTAL | Status: AC
  Administered 2022-01-26: 2 via RECTAL

## 2022-01-26 MED ORDER — PROPOFOL 10 MG/ML IV BOLUS
INTRAVENOUS | Status: DC | PRN
  Administered 2022-01-26: 20 mg via INTRAVENOUS

## 2022-01-26 MED ORDER — PROPOFOL 500 MG/50ML IV EMUL
INTRAVENOUS | Status: DC | PRN
  Administered 2022-01-26: 130 ug/kg/min via INTRAVENOUS

## 2022-01-26 NOTE — Plan of Care (Signed)

## 2022-01-26 NOTE — Progress Notes (Signed)
Physical Therapy Treatment Patient Details Name: [name] MRN: [id number] DOB: Feb 20, 1951 Today's Date: 01/26/2022   History of Present Illness 71 y.o. 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  On presentation, EMS reported temperature of 102.  In the ED, he was tachycardic, tachypneic with mild leukocytosis and creatinine elevated. Dx of acute metabolic encephalopathy, possible sepsis (PNA vs infectious diarrhea vs UTI).    PT Comments    Pt reporting increased RUE and BLE swelling, though does report that bilateral elbows and right TMJ area is feeling better. Pt required additional assistance for bed mobility as compared to previous sessions, min assist to bring RLE off bed. Pt sat for ~55mn EOB to perform exercises focusing on RUE and BLE, encouraged pt to continue exercises outside of therapy session, RN notified as well. Pt required mod assist +2 to return to bed but was able to scoot up in supine with verbal cues, no physical assist required. Pt is making very slow, incremental progress. We will continue to follow acutely.     Recommendations for follow up therapy are one component of a multi-disciplinary discharge planning process, led by the attending physician.  Recommendations may be updated based on patient status, additional functional criteria and insurance authorization.  Follow Up Recommendations  Skilled nursing-short term rehab (<3 hours/day) Can patient physically be transported by private vehicle: No   Assistance Recommended at Discharge Frequent or constant Supervision/Assistance  Patient can return home with the following Two people to help with walking and/or transfers;Two people to help with  bathing/dressing/bathroom;Assistance with cooking/housework;Direct supervision/assist for medications management;Assist for transportation;Help with stairs or ramp for entrance;Direct supervision/assist for financial management   Equipment Recommendations  None recommended by PT    Recommendations for Other Services OT consult     Precautions / Restrictions Precautions Precautions: Fall Precaution Comments: severe knee pain, extends when attempting to stand. Restrictions Weight Bearing Restrictions: No     Mobility  Bed Mobility Overal bed mobility: Needs Assistance Bed Mobility: Supine to Sit, Sit to Supine, Rolling     Supine to sit: HOB elevated, Min assist Sit to supine: +2 for physical assistance, Mod assist   General bed mobility comments: Supine to Sit: min assist for bringing RLE off bed with Incr time d/t pain. With incr time able to self assist with trunk,grimacing in pain. Sit to supine: mod assist +2 for bringing BLE into bed and controlling trunk lowering to bed, pt grimacing in pain. Pt able to self-scoot up in bed using BUE and BLE with moderate multimodal cuing.    Transfers                   General transfer comment: Not attempted today    Ambulation/Gait               General Gait Details: unsafe at present   Stairs             Wheelchair Mobility    Modified Rankin (Stroke Patients Only)       Balance Overall balance assessment: Needs assistance Sitting-balance support: Feet supported, No upper extremity supported Sitting balance-Leahy Scale: Fair Sitting balance - Comments: Pt required min assist for first 574m to provide correction of very mild posterior lean, but for the remaining ~2565mno  assist at all. Postural control: Posterior lean     Standing balance comment: unable to progress past 1 inch off bed                            Cognition Arousal/Alertness: Awake/alert Behavior During Therapy: WFL for  tasks assessed/performed Overall Cognitive Status: No family/caregiver present to determine baseline cognitive functioning                                          Exercises General Exercises - Upper Extremity Digit Composite Flexion: AROM, Right, Other (comment), 20 reps (Making a fist around squeeze ball) General Exercises - Lower Extremity Ankle Circles/Pumps: Both, 10 reps, AROM, Seated Long Arc Quad: Both, Seated, AROM, 10 reps, AAROM, Left, Right (L: AROM, R: AAROM with PT assisting) Hip Flexion/Marching:  (Pt completed L actively without assist, R with some assist from PT) Other Exercises Other Exercises: Pt sat EOB for 20+ min to perform seated RUE and BLE exercises outside of BOS, tossing ball to therapist with only RUE, attempting to catch, R and L 10 attempts to each side, no physical assist provided    General Comments        Pertinent Vitals/Pain Pain Assessment Pain Assessment: Faces Faces Pain Scale: Hurts even more Breathing: normal Negative Vocalization: none Facial Expression: smiling or inexpressive Body Language: relaxed Consolability: no need to console PAINAD Score: 0 Pain Location: abdomen, BLE Pain Descriptors / Indicators: Grimacing, Discomfort, Moaning, Guarding Pain Intervention(s): Limited activity within patient's tolerance, Monitored during session, Repositioned    Home Living                          Prior Function            PT Goals (current goals can now be found in the care plan section) Acute Rehab PT Goals Patient Stated Goal: none stated PT Goal Formulation: With patient Time For Goal Achievement: 01/29/22 Potential to Achieve Goals: Fair Progress towards PT goals: Progressing toward goals (very slowly)    Frequency    Min 2X/week      PT Plan Current plan remains appropriate    Co-evaluation              AM-PAC PT "6 Clicks" Mobility   Outcome Measure  Help needed turning from your  back to your side while in a flat bed without using bedrails?: A Lot Help needed moving from lying on your back to sitting on the side of a flat bed without using bedrails?: A Little Help needed moving to and from a bed to a chair (including a wheelchair)?: Total Help needed standing up from a chair using your arms (e.g., wheelchair or bedside chair)?: Total Help needed to walk in hospital room?: Total Help needed climbing 3-5 steps with a railing? : Total 6 Click Score: 9    End of Session   Activity Tolerance: Patient limited by fatigue;Patient limited by pain Patient left: in bed;with call bell/phone within reach;with bed alarm set Nurse Communication: Mobility status PT Visit Diagnosis: Difficulty in walking, not elsewhere classified (R26.2);Pain;Hemiplegia and hemiparesis Hemiplegia - Right/Left: Right Pain - Right/Left: Right (bil R>L) Pain - part of body: Leg (abdomen)     Time: 5732-2025 PT Time Calculation (min) (ACUTE ONLY): 28 min  Charges:  $Therapeutic Exercise:  8-22 mins $Therapeutic Activity: 8-22 mins                     Coolidge Breeze, PT, DPT [facility] Office: [phone] Pager: [phone]   Coolidge Breeze 01/26/2022, 1:30 PM

## 2022-01-26 NOTE — Anesthesia Postprocedure Evaluation (Signed)
Anesthesia Post Note  Patient: [name], advertising  Procedure(s) Performed: [place]     Patient location during evaluation: PACU Anesthesia Type: MAC Level of consciousness: awake and alert Pain management: pain level controlled Vital Signs Assessment: post-procedure vital signs reviewed and stable Respiratory status: spontaneous breathing, nonlabored ventilation and respiratory function stable Cardiovascular status: blood pressure returned to baseline and stable Postop Assessment: no apparent nausea or vomiting Anesthetic complications: no   No notable events documented.  Last Vitals:  Vitals:   01/26/22 1520 01/26/22 1535  BP: (!) 146/81 (!) 135/91  Pulse: 69 69  Resp: 18 18  Temp:  (!) 36.4 C  SpO2: 98% 96%    Last Pain:  Vitals:   01/26/22 1535  TempSrc: Oral  PainSc:                  [name]

## 2022-01-26 NOTE — Anesthesia Preprocedure Evaluation (Signed)
Anesthesia Evaluation  Patient identified by MRN, date of birth, ID band Patient awake    Reviewed: Allergy & Precautions, NPO status , Patient's Chart, lab work & pertinent test results, reviewed documented beta blocker date and time   History of Anesthesia Complications Negative for: history of anesthetic complications  Airway Mallampati: I  TM Distance: >3 FB Neck ROM: Full    Dental  (+) Dental Advisory Given, Edentulous Upper   Pulmonary former smoker,  10/13/2019 SARS coronavirus NEG   breath sounds clear to auscultation       Cardiovascular hypertension, Pt. on medications and Pt. on home beta blockers (-) angina Rhythm:Regular Rate:Normal  '20 ECHO: EF 55-60%, moderate AI, no AS   Neuro/Psych CVA    GI/Hepatic Neg liver ROS, Incisional hernia   Endo/Other    Renal/GU Renal InsufficiencyRenal disease (creat 2.07)     Musculoskeletal  (+) Arthritis ,   Abdominal (+) + obese,   Peds  Hematology  (+) Blood dyscrasia, anemia ,   Anesthesia Other Findings   Reproductive/Obstetrics                             Anesthesia Physical  Anesthesia Plan  ASA: III  Anesthesia Plan: MAC   Post-op Pain Management: Minimal or no pain anticipated   Induction: Intravenous  PONV Risk Score and Plan: 1 and Ondansetron and Treatment may vary due to age or medical condition  Airway Management Planned: Nasal Cannula  Additional Equipment: None  Intra-op Plan:   Post-operative Plan: Extubation in OR  Informed Consent: I have reviewed the patients History and Physical, chart, labs and discussed the procedure including the risks, benefits and alternatives for the proposed anesthesia with the patient or authorized representative who has indicated his/her understanding and acceptance.     Dental advisory given  Plan Discussed with: CRNA and Surgeon  Anesthesia Plan Comments: (PAT note  written 10/15/2019 by Myra Gianotti, PA-C. )        Anesthesia Quick Evaluation

## 2022-01-26 NOTE — Progress Notes (Signed)
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  On presentation, EMS reported temperature of 102.  In the ED, he was tachycardic, tachypneic with mild leukocytosis and creatinine elevated to 2.82 from prior of 1.96.  CT of abdomen and pelvis showed bibasilar atelectasis with superimposed developing infection/inflammation not excluded with fresh transition state of the colon, no diverticulitis.  He was started on IV fluids and antibiotics.  Blood and urine cultures negative so far.  He required Foley catheter placement for urinary retention.  Hospital course complicated by intermittent diarrhea with occasional blood, Plavix was initially on hold for few days and GI was consulted.  His bleeding seems to have resolved, Plavix was resumed but had another bloody bowel movement 8/22  Subjective / 24h Interval events: Still complains of mild abdominal distention, also complains of his chronic bilateral knee pain  Assesement and Plan: Principal Problem:   AMS (altered mental status) Active Problems:   Obstructive uropathy   Essential hypertension   BPH (benign prostatic hyperplasia)   Chronic kidney disease, stage 3b (HCC)   AKI (acute kidney injury) ([place])   Hypokalemia   Acute urinary retention   Aortic root dilatation (HCC)   Chronic heart failure with preserved ejection fraction (HFpEF) (HCC)   Expressive aphasia   Sepsis ([place])   History of CVA (cerebrovascular accident)   Acute metabolic encephalopathy   Leukocytosis   Obesity (BMI 30-39.9)   Physical deconditioning   SVT  (supraventricular tachycardia) (HCC)   Acute on chronic diastolic heart failure (HCC)   NSVT (nonsustained ventricular tachycardia) ([place])   Ileus due to infection [facility])   Principal problem SIRS, possible sepsis, POA-unclear etiology, there was questionable bacterial pneumonia, infective diarrhea and concern for UTI.  He had fever, tachycardia, tachypnea and elevated white count on admission.  Cultures have remained negative so far, GI pathogen panel and C. difficile were not run initially because diarrhea resolved.  Has completed 7 days of empiric antibiotics, now monitored off.  Sugars have remained negative.  With recurrence of his diarrhea GI pathogen panel was sent again 8/20 and it was negative.  Active problems Concern for sigmoid colon mass, concern for lower GI bleed-patient had 2 episodes of loose stools with blood last week a day apart, GI consulted and evaluated.  His Plavix was held, he has not had any further bleeding and no procedures were planned.  Plavix was resumed on 8/20, however this morning 8/22 he has been having recurrent bloody bowel movement per bedside RN.  Discussed with GI, they will reevaluate.  Hold Plavix, make n.p.o. of note, patient had a CT scan of the abdomen and pelvis on admission that did not show any concerns for sigmoid colon mass, however scan a week later raise that possibility.  Appreciate GI input  Acute metabolic encephalopathy-possibly from fever, sepsis. Improved, suspect close to baseline.  Still has expressive aphasia from prior stroke.  Leukocytosis-WBC normalized  Normocytic anemia-From anemia of chronic disease.  Hemoglobin overall stable but drifting down over the last few days.  Monitor  Abdominal distention-overall better.  Allow regular diet  Acute kidney injury on CKD stage  IIIb -Creatinine 2.83 on presentation; was 1.96 on discharge last month.  Has received IV fluids initially, now discontinued.  Received Lasix 8/14 and 8/15 per  cardiology due to fluid overload.  Creatinine has stabilized and has remained at baseline, most recent value 1.78.  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  Patient possibly has residual aphasia with aphasia and right-sided weakness from prior stroke. MRI of brain during this admission was negative for any stroke. Outpatient follow-up with neurology.  Plavix now on hold   SVT -Patient had an episode of SVT with heart rate going up to 170 on 01/18/2022.  Cardiology has been consulted, has been placed on metoprolol, continue   Chronic diastolic heart failure -Has slightly swollen lower extremities.  Strict input and output.  Daily weights.  Patient received Lasix 8/14, 8/15.  Status better.  Continue p.o. 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 Recommend follow-up ultrasound every 3 years   BPH, acute urinary retention -With lower urinary tract symptoms.  Patient required in and out cath few times and subsequently Foley catheter placed on 01/14/2022.  Flomax started on 01/14/2022.  Failed voiding trial 8/16, he will need to be discharged with a Foley catheter and have outpatient urology follow-up   Essential hypertension -Monitor blood pressure.   Physical deconditioning -patient will have to return back to SNF.  TOC following.  Hypokalemia -continue to monitor and replace as indicated   Obesity -Outpatient follow-up   Chronic knee pain -Follows up with orthopedic/sports medicine as an outpatient for recurrent steroid injections.  Outpatient follow-up with orthopedics.  Scheduled Meds:  Chlorhexidine Gluconate Cloth  6 each Topical Daily   clopidogrel  75 mg Oral Daily   enoxaparin (LOVENOX) injection  40 mg Subcutaneous Q24H   feeding supplement   237 mL Oral BID BM   furosemide  40 mg Oral Daily   metoprolol tartrate  25 mg Oral BID   pantoprazole  40 mg Oral Daily   polyethylene glycol  17 g Oral Daily   senna-docusate  2 tablet Oral BID   tamsulosin  0.4 mg Oral QPC supper   Continuous Infusions:  sodium chloride 10 mL/hr at 01/18/22 1506   PRN Meds:.sodium chloride, acetaminophen, bisacodyl, iohexol, morphine injection, traMADol  Diet Orders (From admission, onward)     Start     Ordered   01/24/22 0939  Diet Heart Room service appropriate? Yes; Fluid consistency: Thin  Diet effective now       Question Answer Comment  Room service appropriate? Yes   Fluid consistency: Thin      01/24/22 0938            DVT prophylaxis: enoxaparin (LOVENOX) injection 40 mg Start: 01/13/22 2230   Lab Results  Component Value Date   PLT 494 (H) 01/26/2022      Code Status: Full Code  Family Communication: No family at bedside  Status is: Inpatient Remains inpatient appropriate because: Severity of illness   Level of care: Telemetry  Consultants:  cards   Objective: Vitals:   01/25/22 2223 01/26/22 0435 01/26/22 0436 01/26/22 0957  BP: (!) 142/88 (!) 138/90  (!) 150/89  Pulse: 86 75  74  Resp:  16    Temp: 99.4 F (37.4 C) 98.7 F (37.1 C)  98 F (36.7 C)  TempSrc: Oral Oral  Oral  SpO2:  93%  100%  Weight:   106.6 kg   Height:        Intake/Output Summary (Last 24 hours) at 01/26/2022 1111 Last data filed at 01/26/2022 0086 Gross per 24 hour  Intake 240 ml  Output 1550 ml  Net -1310 ml    Wt Readings from Last 3 Encounters:  01/26/22 106.6 kg  12/09/21 117.5 kg  11/30/21 113.6 kg    Examination:  Constitutional: NAD Eyes: lids and conjunctivae normal, no scleral icterus ENMT: mmm Neck: normal, supple Respiratory: clear to auscultation bilaterally, no wheezing, no crackles. Normal respiratory effort.  Cardiovascular: Regular rate and rhythm, no murmurs / rubs / gallops. No LE  edema. Abdomen: soft, mild distention, no tenderness. Bowel sounds positive.  Skin: no rashes Neurologic: no focal deficits, equal strength   Data Reviewed: I have independently reviewed following labs and imaging studies   CBC Recent Labs  Lab 01/20/22 0744 01/21/22 0551 01/22/22 1327 01/23/22 0537 01/24/22 0521 01/26/22 0902  WBC 10.1 9.1 9.5 9.4 9.1 8.2  HGB 11.7* 11.5* 11.8* 11.6* 11.1* 10.8*  HCT 35.7* 35.5* 36.7* 35.7* 34.3* 33.2*  PLT 550* 546* 553* 559* 544* 494*  MCV 89.0 90.8 89.5 89.7 89.8 89.7  MCH 29.2 29.4 28.8 29.1 29.1 29.2  MCHC 32.8 32.4 32.2 32.5 32.4 32.5  RDW 16.0* 16.3* 16.2* 16.0* 15.9* 15.8*  LYMPHSABS 2.0  --   --   --   --   --   MONOABS 0.9  --   --   --   --   --   EOSABS 0.2  --   --   --   --   --   BASOSABS 0.1  --   --   --   --   --      Recent Labs  Lab 01/20/22 0744 01/21/22 0555 01/22/22 1326 01/23/22 0537 01/24/22 0521  NA 145 144 145 143 143  K 3.2* 2.9* 3.7 3.5 3.1*  CL 109 111 112* 109 112*  CO2 '27 27 26 27 27  '$ GLUCOSE 107* 101* 100* 91 87  BUN 40* 39* 38* 36* 32*  CREATININE 2.07* 2.07* 2.14* 1.89* 1.78*  CALCIUM 8.7* 8.8* 9.1 8.7* 8.8*  MG 1.9  --  1.9 1.8 1.7  BNP 41.7  --   --   --   --      ------------------------------------------------------------------------------------------------------------------ No results for input(s): "CHOL", "HDL", "LDLCALC", "TRIG", "CHOLHDL", "LDLDIRECT" in the last 72 hours.  Lab Results  Component Value Date   HGBA1C 5.6 12/09/2021   ------------------------------------------------------------------------------------------------------------------ No results for input(s): "TSH", "T4TOTAL", "T3FREE", "THYROIDAB" in the last 72 hours.  Invalid input(s): "FREET3"  Cardiac Enzymes No results for input(s): "CKMB", "TROPONINI", "MYOGLOBIN" in the last 168 hours.  Invalid input(s):  "CK" ------------------------------------------------------------------------------------------------------------------    Component Value Date/Time   BNP 41.7 01/20/2022 0744    CBG: No results for input(s): "GLUCAP" in the last 168 hours.  Recent Results (from the past 240 hour(s))  Gastrointestinal Panel by PCR , Stool     Status: None   Collection Time: 01/24/22 12:40 PM   Specimen: Stool  Result Value Ref Range Status   Campylobacter species NOT DETECTED NOT DETECTED Final   Plesimonas shigelloides NOT DETECTED NOT DETECTED Final   Salmonella species NOT DETECTED NOT DETECTED Final   Yersinia enterocolitica NOT DETECTED NOT DETECTED Final   Vibrio species NOT DETECTED NOT DETECTED Final   Vibrio cholerae NOT DETECTED NOT  DETECTED Final   Enteroaggregative E coli (EAEC) NOT DETECTED NOT DETECTED Final   Enteropathogenic E coli (EPEC) NOT DETECTED NOT DETECTED Final   Enterotoxigenic E coli (ETEC) NOT DETECTED NOT DETECTED Final   Shiga like toxin producing E coli (STEC) NOT DETECTED NOT DETECTED Final   Shigella/Enteroinvasive E coli (EIEC) NOT DETECTED NOT DETECTED Final   Cryptosporidium NOT DETECTED NOT DETECTED Final   Cyclospora cayetanensis NOT DETECTED NOT DETECTED Final   Entamoeba histolytica NOT DETECTED NOT DETECTED Final   Giardia lamblia NOT DETECTED NOT DETECTED Final   Adenovirus F40/41 NOT DETECTED NOT DETECTED Final   Astrovirus NOT DETECTED NOT DETECTED Final   Norovirus GI/GII NOT DETECTED NOT DETECTED Final   Rotavirus A NOT DETECTED NOT DETECTED Final   Sapovirus (I, II, IV, and V) NOT DETECTED NOT DETECTED Final    Comment: Performed at [facility], [address] [place], [place] [address]      Radiology Studies: No results found.   Marzetta Board, MD, PhD [facility]  Between 7 am - 7 pm I am available, please contact me via Amion (for emergencies) or Securechat (non urgent messages)  Between 7 pm - 7 am I am not  available, please contact night coverage MD/APP via Amion

## 2022-01-26 NOTE — Op Note (Signed)
[facility] Patient Name: [name] Procedure Date: 01/26/2022 MRN: [id number] Attending MD: Jerene Bears , MD Date of Birth: Apr 06, 1951 CSN: [id number] Age: 71 Admit Type: Inpatient Procedure:                Flexible Sigmoidoscopy Indications:              Rectal bleeding (low vol) in setting of Plavix and                            Lovenox prophylaxis dose; previous CT abd                            suggesting possible sigmoid thickening Providers:                Lajuan Lines. Hilarie Fredrickson, MD, Mikey College, RN, Hinton Dyer                            Technician, Technician Referring MD:             [facility] Medicines:                Monitored Anesthesia Care Complications:            No immediate complications. Estimated Blood Loss:     Estimated blood loss: none. Procedure:                Pre-Anesthesia Assessment:                           - Prior to the procedure, a History and Physical                            was performed, and patient medications and                            allergies were reviewed. The patient's tolerance of                            previous anesthesia was also reviewed. The risks                            and benefits of the procedure and the sedation                            options and risks were discussed with the patient.                            All questions were answered, and informed consent                            was obtained. Prior Anticoagulants: The patient has                            taken Plavix (clopidogrel), last dose was 1 day  prior to procedure. ASA Grade Assessment: III - A                            patient with severe systemic disease. After                            reviewing the risks and benefits, the patient was                            deemed in satisfactory condition to undergo the                            procedure.                           After obtaining  informed consent, the scope was                            passed under direct vision. The [id number]-[id number] ([id number])                            Olympus endoscope was introduced through the anus                            and advanced to the descending colon. The flexible                            sigmoidoscopy was accomplished without difficulty.                            The patient tolerated the procedure well. The                            quality of the bowel preparation was adequate. Scope In: 2:39:05 PM Scope Out: 2:47:12 PM Total Procedure Duration: 0 hours 8 minutes 7 seconds  Findings:      Hemorrhoids were found on perianal exam. No masses on DRE.      Semi-solid solid stool was found in the sigmoid colon and in the       descending colon, making complete visualization not possible in some       segments though no masses or polyps were seen. No colitis and visualized       mucosa is normal.      Multiple small and large-mouthed diverticula were found in the sigmoid       colon. Non-bleeding.      Internal hemorrhoids were found during retroflexion. The hemorrhoids       were medium-sized. Impression:               - Light brown, non-bloody, non-melenic stool in the                            sigmoid colon and in the descending colon.                           - Diverticulosis in the  sigmoid colon without                            bleeding.                           - No polyps, masses or colitis seen.                           - Internal hemorrhoids. Most likely source of small                            volume, intermittent, BRBPR.                           - No specimens collected. Moderate Sedation:      N/A Recommendation:           - Return patient to hospital ward for ongoing care.                           - Advance diet as tolerated.                           - Can treat internal hemorrhoids with 3-5 nights                            hydrocortisone 25 mg suppository  and then prep H                            OTC suppositories as needed after discharge.                           - Continue present medications and okay for Plavix.                           - GI will sign off. Call if questions. Procedure Code(s):        --- Professional ---                           [phone], Sigmoidoscopy, flexible; diagnostic,                            including collection of specimen(s) by brushing or                            washing, when performed (separate procedure) Diagnosis Code(s):        --- Professional ---                           K64.8, Other hemorrhoids                           K62.5, Hemorrhage of anus and rectum                           K57.30, Diverticulosis of large intestine  without                            perforation or abscess without bleeding CPT copyright 2019 [employer]. All rights reserved. The codes documented in this report are preliminary and upon coder review may  be revised to meet current compliance requirements. Jerene Bears, MD 01/26/2022 3:10:08 PM This report has been signed electronically. Number of Addenda: 0

## 2022-01-26 NOTE — Progress Notes (Signed)
Daily Progress Note  Hospital Day: 14  Chief Complaint: rectal bleeding, abdominal distention, abnormal CT scan  Brief History Luisangel Wainright is a 71 y.o. 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 Admitted earlier this month with fever, AMS and abdominal pain. We saw him in consult 8/18 for evaluation of blood in stool ( on plavix) and NCCT showing moderate sigmoid colitis vs mass with distention   Assessment / Plan  # Abnormal CT scan  showing sigmoid colitis vrs mass with distention.  Most likely diffuse ileus which is resolving. No vomiting, having BMs (liquid), abdomen soft with + bowel sounds  GI path panel negative.  Tolerating regular diet Continue senna BID, daily miralax   # GI bleed. Previously there were reports of black stool and red blood. Plavix held , no further bleeding. Resumed Plavix Sunday and today with blood in stool    Hemodynamically stable. Hgb down slightly overnight from 11.1 to 10.8. Overall hgb is down from his baseline of 13-15 as recently as July 2023.  Per nursing he had a loose, brown stool with red blood this am. No melena.  Make NPO, holding this am Plavix dose. Will discuss with Dr. Hilarie Fredrickson to see if he wants to proceed with a Flexible sigmoidoscopy to evaluate the bleeding and the wall thickening of sigmoid on CT scan.    # SIS , possible sepsis. Unclear etiology.    # Recent CVA / expressive aphasia.     Subjective  He offers that he has abdominal pain only when asked. He complains of pain in his knees and legs.    Objective   Imaging:  DG Abd Portable 1V  Result Date: 01/23/2022 CLINICAL DATA:  Possible ileus EXAM: PORTABLE ABDOMEN - 1 VIEW COMPARISON:  01/21/2022 FINDINGS: Left-lateral abdomen is excluded from the field of view.  Improved gaseous distension of the small bowel and colon. No bowel dilatation to suggest obstruction. No evidence of pneumoperitoneum, portal venous gas or pneumatosis. No pathologic calcifications along the expected course of the ureters. No acute osseous abnormality. IMPRESSION: 1. Improved gaseous distension of the small bowel and colon likely reflecting a resolving ileus. Electronically Signed   By: Kathreen Devoid M.D.   On: 01/23/2022 09:04   CT ABDOMEN PELVIS WO CONTRAST  Result Date: 01/22/2022 CLINICAL DATA:  Abdominal pain. EXAM: CT ABDOMEN AND PELVIS WITHOUT CONTRAST TECHNIQUE: Multidetector CT imaging of the abdomen and pelvis was performed following the standard protocol without IV contrast. RADIATION DOSE REDUCTION: This exam was performed according to the departmental dose-optimization program which includes automated exposure control, adjustment of the mA and/or kV according to patient size and/or use of iterative reconstruction technique. COMPARISON:  January 13, 2022. FINDINGS: Lower chest: No acute abnormality. Hepatobiliary: No focal liver abnormality is seen. No gallstones, gallbladder wall thickening, or biliary dilatation. Pancreas: Unremarkable. No pancreatic ductal dilatation or surrounding inflammatory changes. Spleen: Status post splenectomy. Adrenals/Urinary Tract: Adrenal glands appear normal. Severe right renal atrophy is noted. Stable left renal cysts are noted. No further follow-up is required. No hydronephrosis or renal obstruction is noted. Urinary bladder is decompressed secondary to Foley catheter. Stomach/Bowel: Stomach is unremarkable. The appendix is unremarkable. No small bowel dilatation is noted. Mildly distended and air-filled colon is noted. There is moderate wall thickening of the proximal sigmoid colon which  may represent focal inflammation, but neoplasm cannot be excluded. Vascular/Lymphatic: Aortic atherosclerosis. No enlarged abdominal or pelvic lymph nodes.  Reproductive: Stable mild prostatic enlargement. Other: No ascites is noted. Postsurgical changes are seen involving the anterior abdominal wall consistent with prior hernia repair. Small fat containing periumbilical hernia is noted. Musculoskeletal: No acute or significant osseous findings. IMPRESSION: Mildly distended and air-filled colon is noted as well as the rectum. There is noted moderate wall thickening of the proximal sigmoid colon which may represent focal inflammation, but neoplasm cannot be excluded. Sigmoidoscopy is recommended for further evaluation. Severe right renal atrophy is noted. Stable left renal cysts are noted for which no further follow-up is required. Hydronephrosis or renal obstruction is noted. Urinary bladder is decompressed secondary to Foley catheter. Status post splenectomy. Surgical changes are seen involving anterior abdominal wall consistent with prior hernia repair. Aortic Atherosclerosis (ICD10-I70.0). Electronically Signed   By: Marijo Conception M.D.   On: 01/22/2022 12:54    Lab Results: Recent Labs    01/24/22 0521 01/26/22 0902  WBC 9.1 8.2  HGB 11.1* 10.8*  HCT 34.3* 33.2*  PLT 544* 494*   BMET Recent Labs    01/24/22 0521  NA 143  K 3.1*  CL 112*  CO2 27  GLUCOSE 87  BUN 32*  CREATININE 1.78*  CALCIUM 8.8*   LFT No results for input(s): "PROT", "ALBUMIN", "AST", "ALT", "ALKPHOS", "BILITOT", "BILIDIR", "IBILI" in the last 72 hours. PT/INR No results for input(s): "LABPROT", "INR" in the last 72 hours.   Scheduled inpatient medications:   Chlorhexidine Gluconate Cloth  6 each Topical Daily   clopidogrel  75 mg Oral Daily   enoxaparin (LOVENOX) injection  40 mg Subcutaneous Q24H   feeding supplement  237 mL Oral BID BM   furosemide  40 mg Oral Daily   metoprolol tartrate  25 mg Oral BID   pantoprazole  40 mg Oral Daily   polyethylene glycol  17 g Oral Daily   senna-docusate  2 tablet Oral BID   tamsulosin  0.4 mg Oral QPC supper    Continuous inpatient infusions:   sodium chloride 10 mL/hr at 01/18/22 1506   PRN inpatient medications: sodium chloride, acetaminophen, bisacodyl, iohexol, morphine injection, traMADol  Vital signs in last 24 hours: Temp:  [97.8 F (36.6 C)-99.4 F (37.4 C)] 98 F (36.7 C) (08/22 0957) Pulse Rate:  [73-86] 74 (08/22 0957) Resp:  [16-20] 16 (08/22 0435) BP: (120-151)/(79-92) 150/89 (08/22 0957) SpO2:  [93 %-100 %] 100 % (08/22 0957) Weight:  [106.6 kg] 106.6 kg (08/22 0436) Last BM Date : 01/25/22  Intake/Output Summary (Last 24 hours) at 01/26/2022 1005 Last data filed at 01/26/2022 9675 Gross per 24 hour  Intake 240 ml  Output 1550 ml  Net -1310 ml    Intake/Output from previous day: 08/21 0701 - 08/22 0700 In: 360 [P.O.:360] Out: 1150 [Urine:1150] Intake/Output this shift: Total I/O In: -  Out: 400 [Urine:400]   Physical Exam:  General: Alert male in NAD Heart:  Regular rate and rhythm.  Pulmonary: Normal respiratory effort Abdomen: Soft, nondistended, nontender. Normal bowel sounds.  Rectal: only scant amount of light brown residue in vault on DRE. Neurologic: Alert  Psych: Pleasant. Cooperative.    Principal Problem:   AMS (altered mental status) Active Problems:   Essential hypertension   BPH (benign prostatic hyperplasia)   Chronic kidney disease, stage 3b (HCC)   AKI (acute kidney injury) ([place])   Obstructive uropathy   Hypokalemia   Acute  urinary retention   Aortic root dilatation (HCC)   Chronic heart failure with preserved ejection fraction (HFpEF) (HCC)   Expressive aphasia   Sepsis ([place])   History of CVA (cerebrovascular accident)   Acute metabolic encephalopathy   Leukocytosis   Obesity (BMI 30-39.9)   Physical deconditioning   SVT (supraventricular tachycardia) (HCC)   Acute on chronic diastolic heart failure (HCC)   NSVT (nonsustained ventricular tachycardia) ([place])   Ileus due to infection ([place])     LOS: 12 days   Tye Savoy ,NP 01/26/2022, 10:05 AM

## 2022-01-26 NOTE — Transfer of Care (Signed)
Immediate Anesthesia Transfer of Care Note  Patient: [name]  Procedure(s) Performed: FLEXIBLE SIGMOIDOSCOPY  Patient Location: PACU  Anesthesia Type:MAC  Level of Consciousness: sedated  Airway & Oxygen Therapy: Patient Spontanous Breathing and Patient connected to face mask oxygen  Post-op Assessment: Report given to RN and Post -op Vital signs reviewed and stable  Post vital signs: Reviewed and stable  Last Vitals:  Vitals Value Taken Time  BP    Temp    Pulse    Resp    SpO2      Last Pain:  Vitals:   01/26/22 1329  TempSrc: Temporal  PainSc: 7       Patients Stated Pain Goal: 0 (14/23/95 3202)  Complications: No notable events documented.

## 2022-01-27 ENCOUNTER — Encounter (HOSPITAL_COMMUNITY): Payer: Self-pay | Admitting: Internal Medicine

## 2022-01-27 DIAGNOSIS — R338 Other retention of urine: Secondary | ICD-10-CM | POA: Diagnosis not present

## 2022-01-27 DIAGNOSIS — N179 Acute kidney failure, unspecified: Secondary | ICD-10-CM | POA: Diagnosis not present

## 2022-01-27 DIAGNOSIS — G9341 Metabolic encephalopathy: Secondary | ICD-10-CM | POA: Diagnosis not present

## 2022-01-27 DIAGNOSIS — I5033 Acute on chronic diastolic (congestive) heart failure: Secondary | ICD-10-CM | POA: Diagnosis not present

## 2022-01-27 LAB — CBC WITH DIFFERENTIAL/PLATELET
Abs Immature Granulocytes: 0.04 10*3/uL (ref 0.00–0.07)
Basophils Absolute: 0.1 10*3/uL (ref 0.0–0.1)
Basophils Relative: 1 %
Eosinophils Absolute: 0.1 10*3/uL (ref 0.0–0.5)
Eosinophils Relative: 1 %
HCT: 32.1 % — ABNORMAL LOW (ref 39.0–52.0)
Hemoglobin: 10.5 g/dL — ABNORMAL LOW (ref 13.0–17.0)
Immature Granulocytes: 1 %
Lymphocytes Relative: 31 %
Lymphs Abs: 2.3 10*3/uL (ref 0.7–4.0)
MCH: 29.2 pg (ref 26.0–34.0)
MCHC: 32.7 g/dL (ref 30.0–36.0)
MCV: 89.2 fL (ref 80.0–100.0)
Monocytes Absolute: 0.7 10*3/uL (ref 0.1–1.0)
Monocytes Relative: 9 %
Neutro Abs: 4.2 10*3/uL (ref 1.7–7.7)
Neutrophils Relative %: 57 %
Platelets: 492 10*3/uL — ABNORMAL HIGH (ref 150–400)
RBC: 3.6 MIL/uL — ABNORMAL LOW (ref 4.22–5.81)
RDW: 15.6 % — ABNORMAL HIGH (ref 11.5–15.5)
WBC: 7.3 10*3/uL (ref 4.0–10.5)
nRBC: 0 % (ref 0.0–0.2)

## 2022-01-27 LAB — COMPREHENSIVE METABOLIC PANEL
ALT: 25 U/L (ref 0–44)
AST: 24 U/L (ref 15–41)
Albumin: 2.3 g/dL — ABNORMAL LOW (ref 3.5–5.0)
Alkaline Phosphatase: 68 U/L (ref 38–126)
Anion gap: 7 (ref 5–15)
BUN: 25 mg/dL — ABNORMAL HIGH (ref 8–23)
CO2: 26 mmol/L (ref 22–32)
Calcium: 8.4 mg/dL — ABNORMAL LOW (ref 8.9–10.3)
Chloride: 107 mmol/L (ref 98–111)
Creatinine, Ser: 1.62 mg/dL — ABNORMAL HIGH (ref 0.61–1.24)
GFR, Estimated: 45 mL/min — ABNORMAL LOW (ref >60)
Glucose, Bld: 94 mg/dL (ref 70–99)
Potassium: 3.5 mmol/L (ref 3.5–5.1)
Sodium: 140 mmol/L (ref 135–145)
Total Bilirubin: 0.8 mg/dL (ref 0.3–1.2)
Total Protein: 6.1 g/dL — ABNORMAL LOW (ref 6.5–8.1)

## 2022-01-27 LAB — MAGNESIUM: Magnesium: 1.7 mg/dL (ref 1.7–2.4)

## 2022-01-27 MED ORDER — SENNOSIDES-DOCUSATE SODIUM 8.6-50 MG PO TABS: 2.0000 | ORAL_TABLET | Freq: Two times a day (BID) | ORAL | Status: AC

## 2022-01-27 MED ORDER — HYDROCORTISONE ACETATE 25 MG RE SUPP: 25.0000 mg | Freq: Every day | RECTAL | 0 refills | Status: AC

## 2022-01-27 MED ORDER — POTASSIUM CHLORIDE CRYS ER 20 MEQ PO TBCR
40.0000 meq | EXTENDED_RELEASE_TABLET | Freq: Once | ORAL | Status: AC
  Administered 2022-01-27: 40 meq via ORAL
  Filled 2022-01-27: qty 2

## 2022-01-27 MED ORDER — ACETAMINOPHEN 325 MG PO TABS: 650.0000 mg | ORAL_TABLET | Freq: Four times a day (QID) | ORAL | Status: AC | PRN

## 2022-01-27 MED ORDER — PHENYLEPHRINE-MINERAL OIL-PET 0.25-14-74.9 % RE OINT: 1.0000 | TOPICAL_OINTMENT | Freq: Two times a day (BID) | RECTAL | Status: AC | PRN

## 2022-01-27 MED ORDER — MAGNESIUM SULFATE IN D5W 1-5 GM/100ML-% IV SOLN
1.0000 g | Freq: Once | INTRAVENOUS | Status: AC
  Administered 2022-01-27: 1 g via INTRAVENOUS
  Filled 2022-01-27 (×2): qty 100

## 2022-01-27 MED ORDER — FUROSEMIDE 40 MG PO TABS: 40.0000 mg | ORAL_TABLET | Freq: Every day | ORAL | Status: AC

## 2022-01-27 MED ORDER — POLYETHYLENE GLYCOL 3350 17 G PO PACK: 17.0000 g | PACK | Freq: Every day | ORAL | Status: AC

## 2022-01-27 NOTE — Discharge Summary (Signed)
Physician Discharge Summary  [name] MEQ:[id number] DOB: [birth year]  PCP: Biagio Borg, MD  Admitted from: SNF Discharged to: SNF  Admit date: 01/13/2022 Discharge date: 01/27/2022  Recommendations for Outpatient Follow-up:    Follow-up Information     Nahser, Wonda Cheng, MD Follow up.   Specialty: Cardiology Why: Follow-up with Cardiology scheduled for 9/11/023 at 10:40am. Please arrive 15 minutes early for check-in. If this date/time does not work for you, please call our office to reschedule. Contact information: [place] [address] [phone]         MD at SNF. Schedule an appointment as soon as possible for a visit.   Why: To be seen in 3 to 4 days with repeat labs (CBC & BMP).        Pyrtle, Lajuan Lines, MD. Schedule an appointment as soon as possible for a visit.   Specialty: Gastroenterology Why: As needed, If symptoms worsen.  For recurrent worsening rectal bleeding. Contact information: [address] [place] [address] [phone]         Biagio Borg, MD. Schedule an appointment as soon as possible for a visit.   Specialties: Internal Medicine, Radiology Why: To be seen upon discharge from SNF. Contact information: [place] [address] [phone]         [facility]. Schedule an appointment as soon as possible for a visit.   Why: To be seen in the next 1 to 2 weeks for evaluation and management of acute urinary retention that required Foley catheterization. Contact information: [place]                 Home Health: None    Equipment/Devices: TBD at [facility]    Discharge Condition: Improved and stable.   Code Status: Full Code Diet recommendation:  Discharge Diet Orders (From admission, onward)     Start     Ordered   01/27/22 0000  Diet - low sodium heart healthy        01/27/22 1001              Discharge Diagnoses:  Principal Problem:   AMS (altered mental status) Active Problems:   Obstructive uropathy   Essential hypertension   BPH (benign prostatic hyperplasia)   Chronic kidney disease, stage 3b (HCC)   AKI (acute kidney injury) ([place])   Hypokalemia   Acute urinary retention   Aortic root dilatation (HCC)   Chronic heart failure with preserved ejection fraction (HFpEF) (HCC)   Expressive aphasia   Sepsis ([place])   History of CVA (cerebrovascular accident)   Acute metabolic encephalopathy   Leukocytosis   Obesity (BMI 30-39.9)   Physical deconditioning   SVT (supraventricular tachycardia) (HCC)   Acute on chronic diastolic heart failure (HCC)   NSVT (nonsustained ventricular tachycardia) (HCC)   Ileus due to infection [facility])   Rectal bleeding   Brief Summary: 71 year old male with medical history significant of HTN, HLD , HFpEF, BPH, CKD 3b, recent admission and discharged from 12/09/2021-12/12/2021 for acute left MCA branch possible embolic CVA resulting in expressive aphasia and right-sided weakness with discharge to SNF presented with fever and altered mental status.  On presentation, EMS reported temperature of 102.  In the ED, he was tachycardic, tachypneic with mild leukocytosis and creatinine elevated to 2.82 from prior of 1.96.  CT of abdomen and pelvis showed bibasilar atelectasis with superimposed developing infection/inflammation  not excluded with fresh transition state of the colon, no diverticulitis.  He was started on IV fluids and antibiotics.  Blood and urine cultures.  He required Foley catheter placement for urinary retention.  Hospital course complicated by intermittent diarrhea with occasional blood, Plavix was initially on hold for few days and GI was consulted.  His bleeding seemed to have resolved, Plavix was resumed but had another bloody bowel movement 8/22.  [facility] was reconsulted, underwent flexible sigmoidoscopy with etiology of rectal bleeding felt  to be due to hemorrhoids.  Assessment and plan:  Principal problem: SIRS, possible sepsis, POA-unclear etiology, there was questionable bacterial pneumonia, infective diarrhea and concern for UTI.  He had fever, tachycardia, tachypnea and elevated white count on admission.  Blood and urine cultures negative.  GI panel PCR negative.  COVID-19 and flu panel PCR negative.  C. difficile sample never sent.  Diarrhea resolved.  Has completed 7 days of empiric antibiotics, now monitored off.     Active problems Concern for sigmoid colon mass, concern for lower GI bleed-patient had 2 episodes of loose stools with blood last week a day apart, GI consulted and evaluated.  His Plavix was held, he had not had any further bleeding and no procedures were planned.  Plavix was resumed on 8/20, however the morning of 8/22 he has been having recurrent bloody bowel movement per bedside RN.  CT scan of the abdomen and pelvis on admission that did not show any concerns for sigmoid colon mass, however scan a week later raise that possibility.  [facility] follow-up appreciated.  Plavix briefly held.  Underwent flexible sigmoid anoscopy on 8/22 and rectal bleeding was felt to be hemorrhoidal, GI recommended advancing diet as tolerated, okay to continue Plavix, management of hemorrhoids as noted below.  No further rectal bleeding since yesterday morning.  Hemoglobin stable.   Acute metabolic encephalopathy-possibly from fever, sepsis. Improved, suspect close to baseline.  Still has expressive aphasia from prior stroke.   Leukocytosis-WBC normalized  Normocytic anemia-From anemia of chronic disease.  Hemoglobin overall stable but drifting down over the last few days.  Monitor   Abdominal distention-overall better.  Allow regular diet  Acute kidney injury on CKD stage IIIb -Creatinine 2.83 on presentation; was 1.96 on discharge last month.  Has received IV fluids initially, now discontinued.  Received Lasix 8/14 and 8/15 per  cardiology due to fluid overload and then has remained on oral Lasix.  Creatinine has stabilized and has remained at baseline, most recent value 1.6 range for the last 2 days.  Follow BMP periodically as outpatient.   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  Patient possibly has residual aphasia with aphasia and right-sided weakness from prior stroke. MRI of brain during this admission was negative for any stroke. Outpatient follow-up with neurology.  Plavix resumed postprocedure as per [facility] MD recommendations.   SVT -Patient had an episode of SVT with heart rate going up to 170 on 01/18/2022.  Cardiology has been consulted, has been placed on metoprolol, continue.  Will replace low K and magnesium some more prior to DC.   Chronic diastolic heart failure -Has slightly swollen lower extremities.  Strict input and output.  Daily weights.  Patient received Lasix 8/14, 8/15.  Continue oral Lasix at DC.  Has outpatient follow-up with cardiology.    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 Recommend follow-up ultrasound every 3 years.  Recommend close outpatient follow-up with PCP regarding this.   BPH, acute urinary retention -With lower urinary tract symptoms.  Patient required in and out cath few times and subsequently Foley catheter placed on 01/14/2022.  Flomax started on 01/14/2022.  Failed voiding trial 8/16, he will need to be discharged with a Foley catheter and have outpatient urology follow-up   Essential hypertension -Monitor blood pressure.  Continue antihypertensives as below.   Physical deconditioning -patient will have to return back to SNF.  TOC following.  Hypokalemia -continue to monitor and replace as indicated   Body mass index is 31.01 kg/m./Obesity  -Outpatient follow-up   Chronic knee pain -Follows up with orthopedic/sports medicine as an outpatient for recurrent steroid injections.  Outpatient follow-up with orthopedics.  Consultations: [facility] gastroenterology Cardiology  Procedures: Flexible sigmoidoscopy 8/22.   Discharge Instructions  Discharge Instructions     (HEART FAILURE PATIENTS) Call MD:  Anytime you have any of the following symptoms: 1) 3 pound weight gain in 24 hours or 5 pounds in 1 week 2) shortness of breath, with or without a dry hacking cough 3) swelling in the hands, feet or stomach 4) if you have to sleep on extra pillows at night in order to breathe.   Complete by: As directed    Call MD for:   Complete by: As directed    Recurrent rectal bleeding.   Call MD for:  difficulty breathing, headache or visual disturbances   Complete by: As directed    Call MD for:  extreme fatigue   Complete by: As directed    Call MD for:  persistant dizziness or light-headedness   Complete by: As directed    Call MD for:  persistant nausea and vomiting   Complete by: As directed    Call MD for:  severe uncontrolled pain   Complete by: As directed    Call MD for:  temperature >100.4   Complete by: As directed    Diet - low sodium heart healthy   Complete by: As directed    Increase activity slowly   Complete by: As directed    No wound care   Complete by: As directed         Medication List     STOP taking these medications    aspirin EC 81 MG tablet   bisacodyl 10 MG suppository Commonly known as: DULCOLAX   colchicine 0.6 MG tablet   hydroxypropyl methylcellulose / hypromellose 2.5 % ophthalmic solution Commonly known as: ISOPTO TEARS / GONIOVISC   lactulose 10 GM/15ML solution Commonly known as: CHRONULAC   meclizine 25 MG tablet Commonly known as: ANTIVERT   Potassium 99 MG Tabs   predniSONE 20 MG tablet Commonly known as: DELTASONE       TAKE these medications    acetaminophen 325  MG tablet Commonly known as: TYLENOL Take 2 tablets (650 mg total) by mouth every 6 (six) hours as needed for mild pain, moderate pain, fever or headache. What changed:  medication strength how much to take reasons to take this   allopurinol 100 MG tablet Commonly known as: ZYLOPRIM Take 1 tablet (100 mg total) by mouth daily. What changed:  how much to take Another medication with the same name was removed. Continue taking this medication, and follow the directions you see here.   amLODipine 10 MG tablet Commonly known as: NORVASC Take 1 tablet (10 mg total) by mouth daily.  Artificial Tears 1.4 % ophthalmic solution Generic drug: polyvinyl alcohol Place 2 drops into both eyes as needed (Reason not listed on MAR).   atorvastatin 80 MG tablet Commonly known as: LIPITOR Take 1 tablet (80 mg total) by mouth daily. What changed: when to take this   cholecalciferol 25 MCG (1000 UNIT) tablet Commonly known as: VITAMIN D3 Take 1,000 Units by mouth daily.   clopidogrel 75 MG tablet Commonly known as: PLAVIX Take 1 tablet (75 mg total) by mouth daily.   furosemide 40 MG tablet Commonly known as: LASIX Take 1 tablet (40 mg total) by mouth daily. Start taking on: January 28, 2022   hydrALAZINE 10 MG tablet Commonly known as: APRESOLINE Take 10 mg by mouth 2 (two) times daily. What changed: Another medication with the same name was removed. Continue taking this medication, and follow the directions you see here.   hydrocortisone 25 MG suppository Commonly known as: ANUSOL-HC Place 1 suppository (25 mg total) rectally at bedtime for 2 days.   metoprolol tartrate 50 MG tablet Commonly known as: LOPRESSOR Take 1 tablet by mouth twice daily   multivitamin with minerals Tabs tablet Take 1 tablet by mouth daily.   phenylephrine-shark liver oil-mineral oil-petrolatum 0.25-14-74.9 % rectal ointment Commonly known as: PREPARATION H Place 1 Application rectally 2 (two) times  daily as needed for hemorrhoids. Start taking on: January 30, 2022   polyethylene glycol 17 g packet Commonly known as: MIRALAX / GLYCOLAX Take 17 g by mouth daily.   potassium chloride 10 MEQ tablet Commonly known as: KLOR-CON Take 10 mEq by mouth See admin instructions. Take 10 mEq once daily on Monday, Wednesday, and Friday   senna-docusate 8.6-50 MG tablet Commonly known as: Senokot-S Take 2 tablets by mouth 2 (two) times daily.   tamsulosin 0.4 MG Caps capsule Commonly known as: FLOMAX Take 0.4 mg by mouth daily.   traMADol 50 MG tablet Commonly known as: ULTRAM Take 1 tablet (50 mg total) by mouth every 12 (twelve) hours as needed for severe pain.       Allergies  Allergen Reactions   Chicken Meat (Diagnostic) Other (See Comments)    Not listed on MAR   Chicken Protein     No chicken products No eggs No port   Pork-Derived Products Other (See Comments)      Procedures/Studies: DG Abd Portable 1V  Result Date: 01/23/2022 CLINICAL DATA:  Possible ileus EXAM: PORTABLE ABDOMEN - 1 VIEW COMPARISON:  01/21/2022 FINDINGS: Left-lateral abdomen is excluded from the field of view. Improved gaseous distension of the small bowel and colon. No bowel dilatation to suggest obstruction. No evidence of pneumoperitoneum, portal venous gas or pneumatosis. No pathologic calcifications along the expected course of the ureters. No acute osseous abnormality. IMPRESSION: 1. Improved gaseous distension of the small bowel and colon likely reflecting a resolving ileus. Electronically Signed   By: Kathreen Devoid M.D.   On: 01/23/2022 09:04   CT ABDOMEN PELVIS WO CONTRAST  Result Date: 01/22/2022 CLINICAL DATA:  Abdominal pain. EXAM: CT ABDOMEN AND PELVIS WITHOUT CONTRAST TECHNIQUE: Multidetector CT imaging of the abdomen and pelvis was performed following the standard protocol without IV contrast. RADIATION DOSE REDUCTION: This exam was performed according to the departmental dose-optimization  program which includes automated exposure control, adjustment of the mA and/or kV according to patient size and/or use of iterative reconstruction technique. COMPARISON:  January 13, 2022. FINDINGS: Lower chest: No acute abnormality. Hepatobiliary: No focal liver abnormality is seen. No gallstones, gallbladder wall  thickening, or biliary dilatation. Pancreas: Unremarkable. No pancreatic ductal dilatation or surrounding inflammatory changes. Spleen: Status post splenectomy. Adrenals/Urinary Tract: Adrenal glands appear normal. Severe right renal atrophy is noted. Stable left renal cysts are noted. No further follow-up is required. No hydronephrosis or renal obstruction is noted. Urinary bladder is decompressed secondary to Foley catheter. Stomach/Bowel: Stomach is unremarkable. The appendix is unremarkable. No small bowel dilatation is noted. Mildly distended and air-filled colon is noted. There is moderate wall thickening of the proximal sigmoid colon which may represent focal inflammation, but neoplasm cannot be excluded. Vascular/Lymphatic: Aortic atherosclerosis. No enlarged abdominal or pelvic lymph nodes. Reproductive: Stable mild prostatic enlargement. Other: No ascites is noted. Postsurgical changes are seen involving the anterior abdominal wall consistent with prior hernia repair. Small fat containing periumbilical hernia is noted. Musculoskeletal: No acute or significant osseous findings. IMPRESSION: Mildly distended and air-filled colon is noted as well as the rectum. There is noted moderate wall thickening of the proximal sigmoid colon which may represent focal inflammation, but neoplasm cannot be excluded. Sigmoidoscopy is recommended for further evaluation. Severe right renal atrophy is noted. Stable left renal cysts are noted for which no further follow-up is required. Hydronephrosis or renal obstruction is noted. Urinary bladder is decompressed secondary to Foley catheter. Status post splenectomy.  Surgical changes are seen involving anterior abdominal wall consistent with prior hernia repair. Aortic Atherosclerosis (ICD10-I70.0). Electronically Signed   By: Marijo Conception M.D.   On: 01/22/2022 12:54   DG Abd Portable 2V  Result Date: 01/21/2022 CLINICAL DATA:  Abdominal pain EXAM: PORTABLE ABDOMEN - 2 VIEW COMPARISON:  Abdomen radiograph done on 12/09/2021 and CT done on 01/13/2022 FINDINGS: There is moderate to marked gaseous distention of colon. Ascending colon measures 10 cm in diameter. Surgical clips are noted in epigastrium and left upper quadrant. There is interval decrease in amount of stool in colon. IMPRESSION: There is moderate to marked gaseous distention of colon suggesting possible ileus. There is no significant small bowel dilation. Electronically Signed   By: Elmer Picker M.D.   On: 01/21/2022 13:00   DG CHEST PORT 1 VIEW  Result Date: 01/19/2022 CLINICAL DATA:  Dyspnea. EXAM: PORTABLE CHEST 1 VIEW COMPARISON:  01/13/2022 FINDINGS: Hypoventilation with mild atelectasis in the lung bases similar to the prior study. No acute infiltrate or effusion. Negative for heart failure. IMPRESSION: Hypoventilation with mild atelectasis in the lung bases. Electronically Signed   By: Franchot Gallo M.D.   On: 01/19/2022 15:42   CT MAXILLOFACIAL WO CONTRAST  Result Date: 01/16/2022 CLINICAL DATA:  Fever.  Maxillofacial abscess. EXAM: CT MAXILLOFACIAL WITHOUT CONTRAST TECHNIQUE: Multidetector CT imaging of the maxillofacial structures was performed. Multiplanar CT image reconstructions were also generated. RADIATION DOSE REDUCTION: This exam was performed according to the departmental dose-optimization program which includes automated exposure control, adjustment of the mA and/or kV according to patient size and/or use of iterative reconstruction technique. COMPARISON:  None Available. FINDINGS: Osseous: Acute osseous abnormality is present. The mandible is intact and located. No acute or  healing fractures are present. Degenerative changes are present in the upper cervical spine. Orbits: The globes and orbits are within normal limits. Sinuses: A polyp or mucous retention cyst is present in the right maxillary sinus. The paranasal sinuses and mastoid air cells are otherwise clear. Soft tissues: Soft tissues the face are unremarkable. No focal inflammatory changes are present. Salivary glands and ducts are within normal limits. Upper neck is within normal limits as well. Limited intracranial: Atherosclerotic calcifications  are present within the cavernous internal carotid arteries. No acute intracranial abnormality is present. IMPRESSION: 1. No acute or healing fractures. 2. Polyp or mucous retention cyst in the right maxillary sinus. 3. Atherosclerosis. 4. Degenerative changes in the upper cervical spine. Electronically Signed   By: San Morelle M.D.   On: 01/16/2022 16:22   DG Knee 1-2 Views Left  Result Date: 01/16/2022 CLINICAL DATA:  Left knee swelling with pain EXAM: LEFT KNEE - 1-2 VIEW COMPARISON:  None. FINDINGS: There is anterior soft tissue swelling and joint effusion. There is no acute fracture or dislocation identified. There is tricompartmental osteophyte formation. There is patellofemoral compartment joint space narrowing. No focal osseous lesions are seen. IMPRESSION: 1. Anterior knee soft tissue swelling and joint effusion. 2. No acute fracture or dislocation. 3. Tricompartmental osteoarthrosis. Electronically Signed   By: Ronney Asters M.D.   On: 01/16/2022 15:53   MR BRAIN WO CONTRAST  Result Date: 01/14/2022 CLINICAL DATA:  Acute neuro deficit.  Rule out stroke EXAM: MRI HEAD WITHOUT CONTRAST TECHNIQUE: Multiplanar, multiecho pulse sequences of the brain and surrounding structures were obtained without intravenous contrast. COMPARISON:  MRI head 12/09/2021 FINDINGS: Brain: Subacute infarct in the left parietal lobe shows continued healing. This involves the posterior  insula on the left and the left parietal cortex and white matter. No associated hemorrhage. No mass-effect. No new acute infarct compared with the prior study. Moderate chronic microvascular ischemic change in the white matter and pons. Numerous areas of chronic microhemorrhage in the brain bilaterally most likely due to chronic hypertension. Vascular: Diffuse arterial dilatation throughout the circle-of-Willis. The supraclinoid internal carotid artery measures 9 mm on the right and 10 mm on the left. The mid basilar measures 9 mm in diameter. This is chronic and unchanged. Skull and upper cervical spine: No focal abnormality. Sinuses/Orbits: Mild mucosal edema paranasal sinuses. Negative orbit Other: None IMPRESSION: Resolving infarct in the left parietal lobe. No new acute infarct compared with the recent MRI. Moderate chronic microvascular ischemia. Numerous chronic microhemorrhage foci in the brain. Correlate with hypertension history. Dolichoectasia circle-of-Willis Electronically Signed   By: Franchot Gallo M.D.   On: 01/14/2022 10:20   CT ABDOMEN PELVIS WO CONTRAST  Result Date: 01/13/2022 CLINICAL DATA:  Sepsis. "Ems brings pt in from [place] for fever, abdominal distension, not acting like himself. No BM in 2 days per facility." sepsis EXAM: CT ABDOMEN AND PELVIS WITHOUT CONTRAST TECHNIQUE: Multidetector CT imaging of the abdomen and pelvis was performed following the standard protocol without IV contrast. RADIATION DOSE REDUCTION: This exam was performed according to the departmental dose-optimization program which includes automated exposure control, adjustment of the mA and/or kV according to patient size and/or use of iterative reconstruction technique. COMPARISON:  CT abdomen pelvis 05/22/2020 FINDINGS: Lower chest: Bibasilar atelectasis. Enlarged descending thoracic aorta measuring up 3.6 cm. Hepatobiliary: Grossly stable 1.1 cm fluid density lesion within the liver likely  representing a simple hepatic cyst. Status post cholecystectomy. No biliary dilatation. Pancreas: No focal lesion. Normal pancreatic contour. No surrounding inflammatory changes. No main pancreatic ductal dilatation. Spleen: Status post splenectomy. Likely splenosis within the left upper quadrant (2:25, 5:71). Adrenals/Urinary Tract: No adrenal nodule bilaterally. Atrophic right kidney. Bilateral renal cortical scarring. Fluid density lesions within the kidneys likely represent simple renal cysts. Simple renal cysts, in the absence of clinically indicated signs/symptoms, require no independent follow-up. Subcentimeter hypodensities are too small to characterize. No nephrolithiasis and no hydronephrosis. No ureterolithiasis or hydroureter. The urinary bladder is  unremarkable. Stomach/Bowel: Stomach is within normal limits. No evidence of bowel wall thickening or dilatation. Colonic diverticulosis with no acute diverticulitis. Fluid density within the lumen of the rectum. Appendix appears normal. Vascular/Lymphatic: Suprarenal abdominal aorta with an enlarged caliber caliber measuring up to 3.3 cm. Infrarenal abdominal aorta normal in caliber. Mild-to-moderate atherosclerotic plaque of the aorta and its branches. No abdominal, pelvic, or inguinal lymphadenopathy. Reproductive: TURP procedure with enlarged prostate measuring up to 6 cm. Other: No intraperitoneal free fluid. No intraperitoneal free gas. No organized fluid collection. Musculoskeletal: No abdominal wall hernia or abnormality. No suspicious lytic or blastic osseous lesions. No acute displaced fracture. Multilevel degenerative changes of the spine. IMPRESSION: 1. Bibasilar atelectasis with superimposed developing infection/inflammation not excluded. 2. Fast transition state of the colon. 3. Colonic diverticulosis with no acute diverticulitis. 4. 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 Recommend follow-up ultrasound every 3 years. This recommendation  follows ACR consensus guidelines: White Paper of the ACR Incidental Findings Committee II on Vascular Findings. J Am Coll Radiol 2013; 10:789-794. 5. 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 6. Atrophic right kidney. 7. Prostatomegaly status post TURP 8.  Aortic Atherosclerosis (ICD10-I70.0). Electronically Signed   By: Iven Finn M.D.   On: 01/13/2022 17:25   DG Chest Port 1 View  Result Date: 01/13/2022 CLINICAL DATA:  Possible sepsis. EXAM: PORTABLE CHEST 1 VIEW COMPARISON:  12/09/2021 FINDINGS: Stable enlarged cardiac silhouette and tortuous aorta. A poor inspiration is again demonstrated. Decreased mild atelectasis at the left lung base and interval mild atelectasis at the right lung base. Unremarkable bones. IMPRESSION: Poor inspiration with mild bibasilar atelectasis, decreased on the left and new on the right. No evidence of pneumonia. Electronically Signed   By: Claudie Revering M.D.   On: 01/13/2022 15:47   [place] LIMITED JOINT SPACE STRUCTURES LOW RIGHT(NO LINKED CHARGES)  Result Date: 01/03/2022 Limited muscular skeletal ultrasound was performed and interpreted by Hulan Saas, M Limited ultrasound of patient's right knee and does have some swelling noted.  Seems to be more around the chronic synovitis noted.  No signs of any infectious etiology. 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  Patient's RN at bedside, no further rectal bleeding since prior to sigmoidoscopy yesterday.  No other acute issues reported.  Patient able to follow simple instructions.  Discharge Exam:  Vitals:   01/26/22 1520 01/26/22 1535 01/26/22 2051 01/27/22 0348  BP: (!) 146/81 (!) 135/91 (!) 149/91 126/84  Pulse: 69 69 78 80  Resp: '18 18 17 19  '$ Temp:  (!) 97.5 F (36.4 C) 98.5 F (36.9 C) 99.2 F (37.3 C)  TempSrc:  Oral    SpO2: 98% 96% 98% 95%  Weight:      Height:        General: Pt lying comfortably in bed  & appears in no obvious distress. Cardiovascular: S1 & S2 heard, RRR, S1/S2 +. No murmurs, rubs, gallops or clicks. No JVD.  Trace bilateral ankle edema.  Telemetry personally reviewed: Sinus rhythm with BBB morphology, some PVCs and occasional NSVT. Respiratory: Clear to auscultation without wheezing, rhonchi or crackles. No increased work of breathing. Abdominal:  Non distended, non tender & soft. No organomegaly or masses appreciated. Normal bowel sounds heard. CNS: Alert and oriented.  Expressive aphasia.  No facial asymmetry. Extremities: no edema, no cyanosis.  Bilateral upper extremities grade 5 x 5 power.  Left lower extremity grade 4+ by 5 power.  Right  lower extremity great 2 to 3 x 5 power.  Right knee with mild chronic boggy swelling without acute findings.    The results of significant diagnostics from this hospitalization (including imaging, microbiology, ancillary and laboratory) are listed below for reference.     Microbiology: Recent Results (from the past 240 hour(s))  Gastrointestinal Panel by PCR , Stool     Status: None   Collection Time: 01/24/22 12:40 PM   Specimen: Stool  Result Value Ref Range Status   Campylobacter species NOT DETECTED NOT DETECTED Final   Plesimonas shigelloides NOT DETECTED NOT DETECTED Final   Salmonella species NOT DETECTED NOT DETECTED Final   Yersinia enterocolitica NOT DETECTED NOT DETECTED Final   Vibrio species NOT DETECTED NOT DETECTED Final   Vibrio cholerae NOT DETECTED NOT DETECTED Final   Enteroaggregative E coli (EAEC) NOT DETECTED NOT DETECTED Final   Enteropathogenic E coli (EPEC) NOT DETECTED NOT DETECTED Final   Enterotoxigenic E coli (ETEC) NOT DETECTED NOT DETECTED Final   Shiga like toxin producing E coli (STEC) NOT DETECTED NOT DETECTED Final   Shigella/Enteroinvasive E coli (EIEC) NOT DETECTED NOT DETECTED Final   Cryptosporidium NOT DETECTED NOT DETECTED Final   Cyclospora cayetanensis NOT DETECTED NOT DETECTED Final    Entamoeba histolytica NOT DETECTED NOT DETECTED Final   Giardia lamblia NOT DETECTED NOT DETECTED Final   Adenovirus F40/41 NOT DETECTED NOT DETECTED Final   Astrovirus NOT DETECTED NOT DETECTED Final   Norovirus GI/GII NOT DETECTED NOT DETECTED Final   Rotavirus A NOT DETECTED NOT DETECTED Final   Sapovirus (I, II, IV, and V) NOT DETECTED NOT DETECTED Final    Comment: Performed at [facility], [address] [place], [place] [address]     Labs: CBC: Recent Labs  Lab 01/22/22 1327 01/23/22 0537 01/24/22 0521 01/26/22 0902 01/27/22 0639  WBC 9.5 9.4 9.1 8.2 7.3  NEUTROABS  --   --   --   --  4.2  HGB 11.8* 11.6* 11.1* 10.8* 10.5*  HCT 36.7* 35.7* 34.3* 33.2* 32.1*  MCV 89.5 89.7 89.8 89.7 89.2  PLT 553* 559* 544* 494* 492*    Basic Metabolic Panel: Recent Labs  Lab 01/22/22 1326 01/23/22 0537 01/24/22 0521 01/26/22 1049 01/27/22 0639  NA 145 143 143 140 140  K 3.7 3.5 3.1* 3.0* 3.5  CL 112* 109 112* 108 107  CO2 '26 27 27 26 26  '$ GLUCOSE 100* 91 87 106* 94  BUN 38* 36* 32* 26* 25*  CREATININE 2.14* 1.89* 1.78* 1.67* 1.62*  CALCIUM 9.1 8.7* 8.8* 8.5* 8.4*  MG 1.9 1.8 1.7  --  1.7    Liver Function Tests: Recent Labs  Lab 01/27/22 0639  AST 24  ALT 25  ALKPHOS 68  BILITOT 0.8  PROT 6.1*  ALBUMIN 2.3*     Urinalysis    Component Value Date/Time   COLORURINE AMBER (A) 01/13/2022 1845   APPEARANCEUR HAZY (A) 01/13/2022 1845   LABSPEC 1.013 01/13/2022 1845   PHURINE 5.0 01/13/2022 1845   GLUCOSEU NEGATIVE 01/13/2022 1845   GLUCOSEU NEGATIVE 06/12/2021 1510   HGBUR NEGATIVE 01/13/2022 1845   HGBUR negative 01/29/2010 0000   BILIRUBINUR NEGATIVE 01/13/2022 1845   BILIRUBINUR neg 11/26/2017 1304   KETONESUR NEGATIVE 01/13/2022 1845   PROTEINUR 30 (A) 01/13/2022 1845   UROBILINOGEN 0.2 06/12/2021 1510   NITRITE NEGATIVE 01/13/2022 1845   LEUKOCYTESUR TRACE (A) 01/13/2022 1845   Attempted to reach patient's daughter and relative listed on  patient's chart  without success.   Time coordinating discharge: 40 minutes  SIGNED:  Vernell Leep, MD,  FACP, [facility], [facility], [facility] (Care Management Physician Certified). [facility]  To contact the attending provider between 7A-7P or the covering provider during after hours 7P-7A, please log into the web site [email or web address] and access using universal [place] password for that web site. If you do not have the password, please call the hospital operator.

## 2022-01-27 NOTE — Discharge Instructions (Signed)

## 2022-01-27 NOTE — TOC Transition Note (Signed)
Transition of Care [facility]) - CM/SW Discharge Note   Patient Details  Name: [name] MRN: [id number] Date of Birth: 05-Sep-1950  Transition of Care [facility]) CM/SW Contact:  Vassie Moselle, LCSW Phone Number: 01/27/2022, 10:28 AM   Clinical Narrative:    Pt is to return to [facility]. Pt's insurance was denied for SNF placement at their facility. Pt will be going to room 1105B. RN to call report to [phone]. Pt will be transported to facility via [facility]. Pt's son made aware of transfer and is agreeable to this plan.    Final next level of care: Long Term Nursing Home Barriers to Discharge: Barriers Resolved   Patient Goals and CMS Choice Patient states their goals for this hospitalization and ongoing recovery are:: To return to [place] offered to / list presented to : Patient, Adult Children  Discharge Placement   Existing PASRR number confirmed : 01/18/22          Patient chooses bed at: [facility] Patient to be transferred to facility by: [facility] Name of family member notified: Legrand Como Felixx Patient and family notified of of transfer: 01/27/22  Discharge Plan and Services In-house Referral: Clinical Social Work Discharge Planning Services: [employer] Consult Post Acute Care Choice: [place]          DME Arranged: N/A DME Agency: NA                  Social Determinants of Health (SDOH) Interventions     Readmission Risk Interventions    01/27/2022   10:23 AM 10/22/2019   12:04 PM  Readmission Risk Prevention Plan  Transportation Screening Complete Complete  PCP or Specialist Appt within 3-5 Days  Not Complete  Not Complete comments  pending medical stability  HRI or [place]  Complete  Social Work Consult for [place] Planning/Counseling  Complete  Palliative Care Screening  Not Applicable  Medication Review [employer]) Complete Referral to Pharmacy  PCP or Specialist appointment within 3-5 days of  discharge Complete   HRI or [place] Complete   SW Recovery Care/Counseling Consult Complete   Palliative Care Screening Not [place] Complete

## 2022-01-27 NOTE — Progress Notes (Signed)
Patient discharging via [facility] to [facility].

## 2022-01-28 DIAGNOSIS — R6510 Systemic inflammatory response syndrome (SIRS) of non-infectious origin without acute organ dysfunction: Secondary | ICD-10-CM | POA: Diagnosis not present

## 2022-01-28 DIAGNOSIS — I714 Abdominal aortic aneurysm, without rupture, unspecified: Secondary | ICD-10-CM | POA: Diagnosis not present

## 2022-01-28 DIAGNOSIS — R4701 Aphasia: Secondary | ICD-10-CM | POA: Diagnosis not present

## 2022-01-28 DIAGNOSIS — I471 Supraventricular tachycardia: Secondary | ICD-10-CM | POA: Diagnosis not present

## 2022-01-28 DIAGNOSIS — I5032 Chronic diastolic (congestive) heart failure: Secondary | ICD-10-CM | POA: Diagnosis not present

## 2022-01-28 DIAGNOSIS — I679 Cerebrovascular disease, unspecified: Secondary | ICD-10-CM | POA: Diagnosis not present

## 2022-01-28 DIAGNOSIS — M109 Gout, unspecified: Secondary | ICD-10-CM | POA: Diagnosis not present

## 2022-01-28 DIAGNOSIS — I77810 Thoracic aortic ectasia: Secondary | ICD-10-CM | POA: Diagnosis not present

## 2022-01-28 DIAGNOSIS — Z87898 Personal history of other specified conditions: Secondary | ICD-10-CM | POA: Diagnosis not present

## 2022-01-28 DIAGNOSIS — R339 Retention of urine, unspecified: Secondary | ICD-10-CM | POA: Diagnosis not present

## 2022-01-28 DIAGNOSIS — Z87448 Personal history of other diseases of urinary system: Secondary | ICD-10-CM | POA: Diagnosis not present

## 2022-02-01 DIAGNOSIS — A419 Sepsis, unspecified organism: Secondary | ICD-10-CM | POA: Diagnosis not present

## 2022-02-01 DIAGNOSIS — R600 Localized edema: Secondary | ICD-10-CM | POA: Diagnosis not present

## 2022-02-01 DIAGNOSIS — R2681 Unsteadiness on feet: Secondary | ICD-10-CM | POA: Diagnosis not present

## 2022-02-01 DIAGNOSIS — I639 Cerebral infarction, unspecified: Secondary | ICD-10-CM | POA: Diagnosis not present

## 2022-02-01 DIAGNOSIS — R5381 Other malaise: Secondary | ICD-10-CM | POA: Diagnosis not present

## 2022-02-01 DIAGNOSIS — R4702 Dysphasia: Secondary | ICD-10-CM | POA: Diagnosis not present

## 2022-02-01 DIAGNOSIS — I509 Heart failure, unspecified: Secondary | ICD-10-CM | POA: Diagnosis not present

## 2022-02-03 DIAGNOSIS — Z79899 Other long term (current) drug therapy: Secondary | ICD-10-CM | POA: Diagnosis not present

## 2022-02-03 DIAGNOSIS — E119 Type 2 diabetes mellitus without complications: Secondary | ICD-10-CM | POA: Diagnosis not present

## 2022-02-05 DIAGNOSIS — R4701 Aphasia: Secondary | ICD-10-CM | POA: Diagnosis not present

## 2022-02-05 DIAGNOSIS — R2681 Unsteadiness on feet: Secondary | ICD-10-CM | POA: Diagnosis not present

## 2022-02-05 DIAGNOSIS — I639 Cerebral infarction, unspecified: Secondary | ICD-10-CM | POA: Diagnosis not present

## 2022-02-05 DIAGNOSIS — R1310 Dysphagia, unspecified: Secondary | ICD-10-CM | POA: Diagnosis not present

## 2022-02-05 DIAGNOSIS — A419 Sepsis, unspecified organism: Secondary | ICD-10-CM | POA: Diagnosis not present

## 2022-02-05 DIAGNOSIS — R0989 Other specified symptoms and signs involving the circulatory and respiratory systems: Secondary | ICD-10-CM | POA: Diagnosis not present

## 2022-02-05 DIAGNOSIS — Z931 Gastrostomy status: Secondary | ICD-10-CM | POA: Diagnosis not present

## 2022-02-05 DIAGNOSIS — E611 Iron deficiency: Secondary | ICD-10-CM | POA: Diagnosis not present

## 2022-02-05 DIAGNOSIS — M109 Gout, unspecified: Secondary | ICD-10-CM | POA: Diagnosis not present

## 2022-02-05 DIAGNOSIS — Z9981 Dependence on supplemental oxygen: Secondary | ICD-10-CM | POA: Diagnosis not present

## 2022-02-05 DIAGNOSIS — I471 Supraventricular tachycardia: Secondary | ICD-10-CM | POA: Diagnosis not present

## 2022-02-05 DIAGNOSIS — I5032 Chronic diastolic (congestive) heart failure: Secondary | ICD-10-CM | POA: Diagnosis not present

## 2022-02-05 DIAGNOSIS — R262 Difficulty in walking, not elsewhere classified: Secondary | ICD-10-CM | POA: Diagnosis not present

## 2022-02-05 DIAGNOSIS — M6281 Muscle weakness (generalized): Secondary | ICD-10-CM | POA: Diagnosis not present

## 2022-02-05 DIAGNOSIS — Z8719 Personal history of other diseases of the digestive system: Secondary | ICD-10-CM | POA: Diagnosis not present

## 2022-02-05 DIAGNOSIS — E876 Hypokalemia: Secondary | ICD-10-CM | POA: Diagnosis not present

## 2022-02-05 DIAGNOSIS — R634 Abnormal weight loss: Secondary | ICD-10-CM | POA: Diagnosis not present

## 2022-02-07 DIAGNOSIS — M6281 Muscle weakness (generalized): Secondary | ICD-10-CM | POA: Diagnosis not present

## 2022-02-07 DIAGNOSIS — A419 Sepsis, unspecified organism: Secondary | ICD-10-CM | POA: Diagnosis not present

## 2022-02-07 DIAGNOSIS — R4701 Aphasia: Secondary | ICD-10-CM | POA: Diagnosis not present

## 2022-02-07 DIAGNOSIS — R2681 Unsteadiness on feet: Secondary | ICD-10-CM | POA: Diagnosis not present

## 2022-02-07 DIAGNOSIS — R262 Difficulty in walking, not elsewhere classified: Secondary | ICD-10-CM | POA: Diagnosis not present

## 2022-02-07 DIAGNOSIS — R1310 Dysphagia, unspecified: Secondary | ICD-10-CM | POA: Diagnosis not present

## 2022-02-07 DIAGNOSIS — I639 Cerebral infarction, unspecified: Secondary | ICD-10-CM | POA: Diagnosis not present

## 2022-02-08 DIAGNOSIS — A419 Sepsis, unspecified organism: Secondary | ICD-10-CM | POA: Diagnosis not present

## 2022-02-08 DIAGNOSIS — I10 Essential (primary) hypertension: Secondary | ICD-10-CM | POA: Diagnosis not present

## 2022-02-08 DIAGNOSIS — I639 Cerebral infarction, unspecified: Secondary | ICD-10-CM | POA: Diagnosis not present

## 2022-02-08 DIAGNOSIS — R262 Difficulty in walking, not elsewhere classified: Secondary | ICD-10-CM | POA: Diagnosis not present

## 2022-02-08 DIAGNOSIS — M6281 Muscle weakness (generalized): Secondary | ICD-10-CM | POA: Diagnosis not present

## 2022-02-08 DIAGNOSIS — R1310 Dysphagia, unspecified: Secondary | ICD-10-CM | POA: Diagnosis not present

## 2022-02-08 DIAGNOSIS — R2681 Unsteadiness on feet: Secondary | ICD-10-CM | POA: Diagnosis not present

## 2022-02-08 DIAGNOSIS — R4701 Aphasia: Secondary | ICD-10-CM | POA: Diagnosis not present

## 2022-02-09 DIAGNOSIS — M6281 Muscle weakness (generalized): Secondary | ICD-10-CM | POA: Diagnosis not present

## 2022-02-09 DIAGNOSIS — R4701 Aphasia: Secondary | ICD-10-CM | POA: Diagnosis not present

## 2022-02-09 DIAGNOSIS — I639 Cerebral infarction, unspecified: Secondary | ICD-10-CM | POA: Diagnosis not present

## 2022-02-09 DIAGNOSIS — A419 Sepsis, unspecified organism: Secondary | ICD-10-CM | POA: Diagnosis not present

## 2022-02-09 DIAGNOSIS — R2681 Unsteadiness on feet: Secondary | ICD-10-CM | POA: Diagnosis not present

## 2022-02-09 DIAGNOSIS — R600 Localized edema: Secondary | ICD-10-CM | POA: Diagnosis not present

## 2022-02-09 DIAGNOSIS — I509 Heart failure, unspecified: Secondary | ICD-10-CM | POA: Diagnosis not present

## 2022-02-09 DIAGNOSIS — R1310 Dysphagia, unspecified: Secondary | ICD-10-CM | POA: Diagnosis not present

## 2022-02-09 DIAGNOSIS — R4702 Dysphasia: Secondary | ICD-10-CM | POA: Diagnosis not present

## 2022-02-09 DIAGNOSIS — R262 Difficulty in walking, not elsewhere classified: Secondary | ICD-10-CM | POA: Diagnosis not present

## 2022-02-09 DIAGNOSIS — R5381 Other malaise: Secondary | ICD-10-CM | POA: Diagnosis not present

## 2022-02-10 DIAGNOSIS — R4701 Aphasia: Secondary | ICD-10-CM | POA: Diagnosis not present

## 2022-02-10 DIAGNOSIS — R262 Difficulty in walking, not elsewhere classified: Secondary | ICD-10-CM | POA: Diagnosis not present

## 2022-02-10 DIAGNOSIS — M6281 Muscle weakness (generalized): Secondary | ICD-10-CM | POA: Diagnosis not present

## 2022-02-10 DIAGNOSIS — Z79899 Other long term (current) drug therapy: Secondary | ICD-10-CM | POA: Diagnosis not present

## 2022-02-10 DIAGNOSIS — A419 Sepsis, unspecified organism: Secondary | ICD-10-CM | POA: Diagnosis not present

## 2022-02-10 DIAGNOSIS — R1310 Dysphagia, unspecified: Secondary | ICD-10-CM | POA: Diagnosis not present

## 2022-02-10 DIAGNOSIS — I639 Cerebral infarction, unspecified: Secondary | ICD-10-CM | POA: Diagnosis not present

## 2022-02-10 DIAGNOSIS — E119 Type 2 diabetes mellitus without complications: Secondary | ICD-10-CM | POA: Diagnosis not present

## 2022-02-10 DIAGNOSIS — R2681 Unsteadiness on feet: Secondary | ICD-10-CM | POA: Diagnosis not present

## 2022-02-11 DIAGNOSIS — R1310 Dysphagia, unspecified: Secondary | ICD-10-CM | POA: Diagnosis not present

## 2022-02-11 DIAGNOSIS — I5032 Chronic diastolic (congestive) heart failure: Secondary | ICD-10-CM | POA: Diagnosis not present

## 2022-02-11 DIAGNOSIS — I639 Cerebral infarction, unspecified: Secondary | ICD-10-CM | POA: Diagnosis not present

## 2022-02-11 DIAGNOSIS — E119 Type 2 diabetes mellitus without complications: Secondary | ICD-10-CM | POA: Diagnosis not present

## 2022-02-11 DIAGNOSIS — M6281 Muscle weakness (generalized): Secondary | ICD-10-CM | POA: Diagnosis not present

## 2022-02-11 DIAGNOSIS — R262 Difficulty in walking, not elsewhere classified: Secondary | ICD-10-CM | POA: Diagnosis not present

## 2022-02-11 DIAGNOSIS — Z79899 Other long term (current) drug therapy: Secondary | ICD-10-CM | POA: Diagnosis not present

## 2022-02-11 DIAGNOSIS — E876 Hypokalemia: Secondary | ICD-10-CM | POA: Diagnosis not present

## 2022-02-11 DIAGNOSIS — R4701 Aphasia: Secondary | ICD-10-CM | POA: Diagnosis not present

## 2022-02-11 DIAGNOSIS — A419 Sepsis, unspecified organism: Secondary | ICD-10-CM | POA: Diagnosis not present

## 2022-02-11 DIAGNOSIS — N1832 Chronic kidney disease, stage 3b: Secondary | ICD-10-CM | POA: Diagnosis not present

## 2022-02-11 DIAGNOSIS — R2681 Unsteadiness on feet: Secondary | ICD-10-CM | POA: Diagnosis not present

## 2022-02-11 DIAGNOSIS — I130 Hypertensive heart and chronic kidney disease with heart failure and stage 1 through stage 4 chronic kidney disease, or unspecified chronic kidney disease: Secondary | ICD-10-CM | POA: Diagnosis not present

## 2022-02-12 DIAGNOSIS — Z79899 Other long term (current) drug therapy: Secondary | ICD-10-CM | POA: Diagnosis not present

## 2022-02-12 DIAGNOSIS — M6281 Muscle weakness (generalized): Secondary | ICD-10-CM | POA: Diagnosis not present

## 2022-02-12 DIAGNOSIS — I10 Essential (primary) hypertension: Secondary | ICD-10-CM | POA: Diagnosis not present

## 2022-02-12 DIAGNOSIS — R1310 Dysphagia, unspecified: Secondary | ICD-10-CM | POA: Diagnosis not present

## 2022-02-12 DIAGNOSIS — A419 Sepsis, unspecified organism: Secondary | ICD-10-CM | POA: Diagnosis not present

## 2022-02-12 DIAGNOSIS — I639 Cerebral infarction, unspecified: Secondary | ICD-10-CM | POA: Diagnosis not present

## 2022-02-12 DIAGNOSIS — R262 Difficulty in walking, not elsewhere classified: Secondary | ICD-10-CM | POA: Diagnosis not present

## 2022-02-12 DIAGNOSIS — R2681 Unsteadiness on feet: Secondary | ICD-10-CM | POA: Diagnosis not present

## 2022-02-12 DIAGNOSIS — R4701 Aphasia: Secondary | ICD-10-CM | POA: Diagnosis not present

## 2022-02-13 DIAGNOSIS — I119 Hypertensive heart disease without heart failure: Secondary | ICD-10-CM | POA: Diagnosis not present

## 2022-02-14 ENCOUNTER — Encounter: Payer: Self-pay | Admitting: Cardiovascular Disease

## 2022-02-14 DIAGNOSIS — R1310 Dysphagia, unspecified: Secondary | ICD-10-CM | POA: Diagnosis not present

## 2022-02-14 DIAGNOSIS — R262 Difficulty in walking, not elsewhere classified: Secondary | ICD-10-CM | POA: Diagnosis not present

## 2022-02-14 DIAGNOSIS — R2681 Unsteadiness on feet: Secondary | ICD-10-CM | POA: Diagnosis not present

## 2022-02-14 DIAGNOSIS — I639 Cerebral infarction, unspecified: Secondary | ICD-10-CM | POA: Diagnosis not present

## 2022-02-14 DIAGNOSIS — A419 Sepsis, unspecified organism: Secondary | ICD-10-CM | POA: Diagnosis not present

## 2022-02-14 DIAGNOSIS — R4701 Aphasia: Secondary | ICD-10-CM | POA: Diagnosis not present

## 2022-02-14 DIAGNOSIS — M6281 Muscle weakness (generalized): Secondary | ICD-10-CM | POA: Diagnosis not present

## 2022-02-14 NOTE — Progress Notes (Unsigned)
Cardiology Office Note:    Date:  02/16/2022   ID:  [name], DOB 04-02-1951, MRN [id number]  PCP:  Biagio Borg, MD  Cardiologist:  Elaya Droege Electrophysiologist:  None   Referring MD: Biagio Borg, MD   Chief Complaint  Patient presents with   Congestive Heart Failure         Problem List 1. Chest pain  2. CKD -  3.  HTN   Oct. 30, 2020    [name] is a 70 y.o. male with a hx of chest discomfort.   Originally from [place]    We performed a stress Myoview study.  He was found to have apical thinning.  Ejection fraction was calculated to be 38%.  Echocardiogram from 2018 revealed normal left ventricular systolic function.  No significant CP .   Has slight cp.  Has not been exercising much due to his prostate cancer on Oct. 2, 2020   Is having some leg pain - started after his robotic prostate surgery ( oct. 2, 2020 )  Has been to the ER,   Had venous duplex which was negative for DVT He was found to be anemic and he started on multivitamin with iron. Has to do I/O self cath due to prostate enlargement .  Has significant CKD    November 30, 2021 [name] is a 71 year old gentleman with a history of chest pain, anemia and CKD.  Echocardiogram from November, 2020 reveals normal left ventricular systolic function.  He has grade 1 diastolic dysfunction, moderate aortic insufficiency.  We had proposed getting a repeat echocardiogram in 2021 but that has not been done yet.  Still having knee limitations  No CP or dyspnea   Sept. 11, 2023  [name] is seen for follow up of his CP, anemia, CKD  Has had a stroke since I saw him. Was in the hospital for a week or so  Had an acute left MCA embolic CVA   Speaking is difficult  Moderate AI    Past Medical History:  Diagnosis Date   Anemia    normal Fe, nl B12, nl retic, nl EPO July '13   Aortic regurgitation    Moderate AI 04/17/19 echo   Blood transfusion without reported diagnosis    BPH (benign prostatic hyperplasia)     Chronic back pain    Chronic kidney disease    CKD III, obstructive nephropathy   Diverticulosis    Dysuria    Elevated PSA, greater than or equal to 20 ng/ml June '13   PSA 107   Hemorrhoids, internal, with bleeding, prolapse 09/19/2014   Hyperlipidemia 05/29/2014   Hypertension    Obstructive uropathy 11/27/2015   Pre-diabetes    per patient "reduced sugar intake"   Renal insufficiency    Tuberculosis    h/o PPD +   UTI (urinary tract infection)    Vitamin D deficiency 09/13/2017    Past Surgical History:  Procedure Laterality Date   CARPAL TUNNEL RELEASE Right    COLONOSCOPY     FLEXIBLE SIGMOIDOSCOPY N/A 01/26/2022   Procedure: FLEXIBLE SIGMOIDOSCOPY;  Surgeon: Jerene Bears, MD;  Location: [facility];  Service: Gastroenterology;  Laterality: N/A;   HEMORRHOID BANDING     INSERTION OF MESH N/A 10/17/2019   Procedure: Insertion Of Mesh;  Surgeon: Ralene Ok, MD;  Location: [facility];  Service: General;  Laterality: N/A;   SPLENECTOMY     XI ROBOTIC ASSISTED SIMPLE PROSTATECTOMY N/A 03/09/2019   Procedure: XI ROBOTIC ASSISTED  SIMPLE PROSTATECTOMY;  Surgeon: Cleon Gustin, MD;  Location: [facility];  Service: Urology;  Laterality: N/A;   XI ROBOTIC ASSISTED VENTRAL HERNIA N/A 10/17/2019   Procedure: XI ROBOTIC ASSISTED INCISIONAL HERNIA REPAIR WITH MESH;  Surgeon: Ralene Ok, MD;  Location: [facility];  Service: General;  Laterality: N/A;    Current Medications: Current Meds  Medication Sig   acetaminophen (TYLENOL) 325 MG tablet Take 2 tablets (650 mg total) by mouth every 6 (six) hours as needed for mild pain, moderate pain, fever or headache.   allopurinol (ZYLOPRIM) 100 MG tablet Take 1 tablet (100 mg total) by mouth daily.   amLODipine (NORVASC) 10 MG tablet Take 1 tablet (10 mg total) by mouth daily.   atorvastatin (LIPITOR) 80 MG tablet Take 1 tablet (80 mg total) by mouth daily.   cholecalciferol (VITAMIN D3) 25 MCG (1000 UNIT) tablet Take 1,000 Units by mouth  daily.   clopidogrel (PLAVIX) 75 MG tablet Take 1 tablet (75 mg total) by mouth daily.   diclofenac Sodium (VOLTAREN) 1 % GEL Apply 3 g topically 2 (two) times daily.   furosemide (LASIX) 40 MG tablet Take 1 tablet (40 mg total) by mouth daily.   hydrALAZINE (APRESOLINE) 10 MG tablet Take 10 mg by mouth 2 (two) times daily.   magnesium oxide (MAG-OX) 400 (240 Mg) MG tablet Take 400 mg by mouth daily.   metoprolol tartrate (LOPRESSOR) 50 MG tablet Take 1 tablet by mouth twice daily   Multiple Vitamin (MULTIVITAMIN WITH MINERALS) TABS tablet Take 1 tablet by mouth daily.   phenylephrine-shark liver oil-mineral oil-petrolatum (PREPARATION H) 0.25-14-74.9 % rectal ointment Place 1 Application rectally 2 (two) times daily as needed for hemorrhoids.   polyethylene glycol (MIRALAX / GLYCOLAX) 17 g packet Take 17 g by mouth daily.   polyvinyl alcohol (ARTIFICIAL TEARS) 1.4 % ophthalmic solution Place 2 drops into both eyes as needed (Reason not listed on MAR).   potassium chloride SA (KLOR-CON M) 20 MEQ tablet Take 20 mEq by mouth daily.   senna-docusate (SENOKOT-S) 8.6-50 MG tablet Take 2 tablets by mouth 2 (two) times daily.   tamsulosin (FLOMAX) 0.4 MG CAPS capsule Take 0.4 mg by mouth daily.   traMADol (ULTRAM) 50 MG tablet Take 1 tablet (50 mg total) by mouth every 12 (twelve) hours as needed for severe pain.     Allergies:   Chicken meat (diagnostic), Chicken protein, and Pork-derived products   Social History   Socioeconomic History   Marital status: Single    Spouse name: Not on file   Number of children: 2   Years of education: 20   Highest education level: Some college, no degree  Occupational History   Occupation: Lobbyist: [employer]    Comment: unemployed  Tobacco Use   Smoking status: Former    Types: Cigarettes    Quit date: 02/26/1980    Years since quitting: 42.0   Smokeless tobacco: Never  Vaping Use   Vaping Use: Never used  Substance and Sexual  Activity   Alcohol use: No    Alcohol/week: 0.0 standard drinks of alcohol   Drug use: No   Sexual activity: Not Currently  Other Topics Concern   Not on file  Social History Narrative   Native of [place], raised poor farming community. . To [place] '78 - finished College, all but Arts administrator. Married - wife in [place], blocked from [place] to [place]. 1 son '90; 1 dtr '93. 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 Resources depleted.   Right handed.   Caffeine Hot daily one daily.   Social Determinants of Health   Financial Resource Strain: Low Risk  (08/10/2021)   Overall Financial Resource Strain (CARDIA)    Difficulty of Paying Living Expenses: Not hard at all  Food Insecurity: No Food Insecurity (08/10/2021)   Hunger Vital Sign    Worried About Running Out of Food in the Last Year: Never true    Ran Out of Food in the Last Year: Never true  Transportation Needs: No Transportation Needs (12/02/2021)   PRAPARE - Hydrologist (Medical): No    Lack of Transportation (Non-Medical): No  Physical Activity: Sufficiently Active (08/10/2021)   Exercise Vital Sign    Days of Exercise per Week: 5 days    Minutes of Exercise per Session: 30 min  Stress: No Stress Concern Present (08/10/2021)   [place]    Feeling of Stress : Not at all  Social Connections: Unknown (08/10/2021)   Social Connection and Isolation Panel [NHANES]    Frequency of Communication with Friends and Family: More than three times a week    Frequency of Social Gatherings with Friends and Family: Once a week    Attends Religious Services: Never    Marine scientist or Organizations: No    Attends Music therapist: Never    Marital Status: Not on file     Family History: The patient's family history includes Diabetes in his mother; Hearing loss in his brother; Stroke in his brother.  There is no history of Colon cancer, Rectal cancer, Stomach cancer, or Esophageal cancer.  ROS:   Please see the history of present illness.     All other systems reviewed and are negative.  EKGs/Labs/Other Studies Reviewed:    The following studies were reviewed today:   EKG:      Recent Labs: 06/12/2021: TSH 1.92 01/20/2022: B Natriuretic Peptide 41.7 01/27/2022: ALT 25; BUN 25; Creatinine, Ser 1.62; Hemoglobin 10.5; Magnesium 1.7; Platelets 492; Potassium 3.5; Sodium 140  Recent Lipid Panel    Component Value Date/Time   CHOL 123 12/09/2021 0507   TRIG 36 12/09/2021 0507   HDL 48 12/09/2021 0507   CHOLHDL 2.6 12/09/2021 0507   VLDL 7 12/09/2021 0507   LDLCALC 68 12/09/2021 0507    Physical Exam:    Physical Exam: Blood pressure 118/76, pulse 75, height '6\' 1"'$  (1.854 m), SpO2 96 %.       GEN:  elderly male,  has had a stroke,  examined in wheelchair,  no acute distress HEENT: Normal NECK: No JVD; No carotid bruits LYMPHATICS: No lymphadenopathy CARDIAC: RRR  , soft systlic and diastolic murmur  RESPIRATORY:  Clear to auscultation without rales, wheezing or rhonchi  ABDOMEN: Soft, non-tender, non-distended MUSCULOSKELETAL:  1+ leg edema  No deformity  SKIN: Warm and dry NEUROLOGIC: Examined in the wheelchair.  Has expressive aphasia.  Symptoms and signs are consistent with previous stroke.     ASSESSMENT:    1. Acute CVA (cerebrovascular accident) ([facility])      PLAN:       Aortic insufficiency:   stable at this point   2.  stroke:   will get a 30 day event monitor to look for Afib    .    3.  Chronic kidney disease:        4.  Hypertension:   Bp is well controlled.   5.  Leg edema: He is spending a lot of his time with his legs in a dependent position.  He is spending lots of time in the wheelchair.  I encouraged him to elevate his legs.  He needs to avoid salt.    Medication Adjustments/Labs and Tests Ordered: Current medicines are reviewed at  length with the patient today.  Concerns regarding medicines are outlined above.  Orders Placed This Encounter  Procedures   CARDIAC EVENT MONITOR   No orders of the defined types were placed in this encounter.   Patient Instructions  Medication Instructions:  Your physician recommends that you continue on your current medications as directed. Please refer to the Current Medication list given to you today.  *If you need a refill on your cardiac medications before your next appointment, please call your pharmacy*   Testing/Procedures: Your physician has recommended that you wear an event monitor. Event monitors are medical devices that record the heart's electrical activity. Doctors most often [place] these monitors to diagnose arrhythmias. Arrhythmias are problems with the speed or rhythm of the heartbeat. The monitor is a small, portable device. You can wear one while you do your normal daily activities. This is usually used to diagnose what is causing palpitations/syncope (passing out).    Follow-Up: At [facility], you and your health needs are our priority.  As part of our continuing mission to provide you with exceptional heart care, we have created designated Provider Care Teams.  These Care Teams include your primary Cardiologist (physician) and Advanced Practice Providers (APPs -  Physician Assistants and Nurse Practitioners) who all work together to provide you with the care you need, when you need it.  We recommend signing up for the patient portal called "MyChart".  Sign up information is provided on this After Visit Summary.  MyChart is used to connect with patients for Virtual Visits (Telemedicine).  Patients are able to view lab/test results, encounter notes, upcoming appointments, etc.  Non-urgent messages can be sent to your provider as well.   To learn more about what you can do with MyChart, go to [email or web address].    Your next appointment:   6 month(s)  The  format for your next appointment:   In Person  Provider:   Mertie Moores, MD    Other Instructions [employer] Cardiac Event Monitor Instructions Your physician has requested you wear your cardiac event monitor for ____30_ days, (1-30). [employer] may call or text to confirm a shipping address. The monitor will be sent to a land address via UPS. [employer] will not ship a monitor to a PO BOX. It typically takes 3-5 days to receive your monitor after it has been enrolled. [employer] will assist with USPS tracking if your package is delayed. The telephone number for [employer] is [phone]. Once you have received your monitor, please review the enclosed instructions. Instruction tutorials can also be viewed under help and settings on the enclosed cell phone. Your monitor has already been registered assigning a specific monitor serial # to you.  Applying the monitor Remove cell phone from case and turn it on. The cell phone works as Dealer and needs to be within [employer] of you at all times. The cell phone will need to be charged on a daily basis. We recommend you plug the cell phone into the enclosed charger at your bedside table every night.  Monitor batteries: You will receive two monitor batteries labelled #  1 and #2. These are your recorders. Plug battery #2 onto the second connection on the enclosed charger. Keep one battery on the charger at all times. This will keep the monitor battery deactivated. It will also keep it fully charged for when you need to switch your monitor batteries. A small light will be blinking on the battery emblem when it is charging. The light on the battery emblem will remain on when the battery is fully charged.  Open package of a Monitor strip. Insert battery #1 into black hood on strip and gently squeeze monitor battery onto connection as indicated in instruction booklet. Set aside while preparing skin.  Choose location for your strip,  vertical or horizontal, as indicated in the instruction booklet. Shave to remove all hair from location. There cannot be any lotions, oils, powders, or colognes on skin where monitor is to be applied. Wipe skin clean with enclosed Saline wipe. Dry skin completely.  Peel paper labeled #1 off the back of the Monitor strip exposing the adhesive. Place the monitor on the chest in the vertical or horizontal position shown in the instruction booklet. One arrow on the monitor strip must be pointing upward. Carefully remove paper labeled #2, attaching remainder of strip to your skin. Try not to create any folds or wrinkles in the strip as you apply it.  Firmly press and release the circle in the center of the monitor battery. You will hear a small beep. This is turning the monitor battery on. The heart emblem on the monitor battery will light up every 5 seconds if the monitor battery in turned on and connected to the patient securely. Do not push and hold the circle down as this turns the monitor battery off. The cell phone will locate the monitor battery. A screen will appear on the cell phone checking the connection of your monitor strip. This may read poor connection initially but change to good connection within the next minute. Once your monitor accepts the connection you will hear a series of 3 beeps followed by a climbing crescendo of beeps. A screen will appear on the cell phone showing the two monitor strip placement options. Touch the picture that demonstrates where you applied the monitor strip.  Your monitor strip and battery are waterproof. You are able to shower, bathe, or swim with the monitor on. They just ask you do not submerge deeper than 3 feet underwater. We recommend removing the monitor if you are swimming in a lake, river, or ocean.  Your monitor battery will need to be switched to a fully charged monitor battery approximately once a week. The cell phone will alert you of an  action which needs to be made.  On the cell phone, tap for details to reveal connection status, monitor battery status, and cell phone battery status. The green dots indicates your monitor is in good status. A red dot indicates there is something that needs your attention.  To record a symptom, click the circle on the monitor battery. In 30-60 seconds a list of symptoms will appear on the cell phone. Select your symptom and tap save. Your monitor will record a sustained or significant arrhythmia regardless of you clicking the button. Some patients do not feel the heart rhythm irregularities. [employer] will notify us of any serious or critical events.  Refer to instruction booklet for instructions on switching batteries, changing strips, the Do not disturb or Pause features, or any additional questions.  Call [employer] at [phone], to confirm  your monitor is transmitting and record your baseline. They will answer any questions you may have regarding the monitor instructions at that time.  Returning the monitor to [place] all equipment back into blue box. Peel off strip of paper to expose adhesive and close box securely. There is a prepaid UPS shipping label on this box. Drop in a UPS drop box, or at a UPS facility like [employer]. You may also contact [employer] to arrange UPS to pick up monitor package at your home.         Signed, Mertie Moores, MD  02/16/2022 7:58 AM    [place]

## 2022-02-15 ENCOUNTER — Encounter: Payer: Self-pay | Admitting: Cardiovascular Disease

## 2022-02-15 ENCOUNTER — Ambulatory Visit: Payer: Medicare Other | Attending: Cardiovascular Disease | Admitting: Cardiovascular Disease

## 2022-02-15 VITALS — BP 118/76 | HR 75 | Ht 73.0 in

## 2022-02-15 DIAGNOSIS — M6281 Muscle weakness (generalized): Secondary | ICD-10-CM | POA: Diagnosis not present

## 2022-02-15 DIAGNOSIS — R2681 Unsteadiness on feet: Secondary | ICD-10-CM | POA: Diagnosis not present

## 2022-02-15 DIAGNOSIS — R1310 Dysphagia, unspecified: Secondary | ICD-10-CM | POA: Diagnosis not present

## 2022-02-15 DIAGNOSIS — I639 Cerebral infarction, unspecified: Secondary | ICD-10-CM

## 2022-02-15 DIAGNOSIS — R262 Difficulty in walking, not elsewhere classified: Secondary | ICD-10-CM | POA: Diagnosis not present

## 2022-02-15 DIAGNOSIS — R4701 Aphasia: Secondary | ICD-10-CM | POA: Diagnosis not present

## 2022-02-15 DIAGNOSIS — A419 Sepsis, unspecified organism: Secondary | ICD-10-CM | POA: Diagnosis not present

## 2022-02-15 NOTE — Patient Instructions (Signed)
Medication Instructions:  Your physician recommends that you continue on your current medications as directed. Please refer to the Current Medication list given to you today.  *If you need a refill on your cardiac medications before your next appointment, please call your pharmacy*   Testing/Procedures: Your physician has recommended that you wear an event monitor. Event monitors are medical devices that record the heart's electrical activity. Doctors most often [place] these monitors to diagnose arrhythmias. Arrhythmias are problems with the speed or rhythm of the heartbeat. The monitor is a small, portable device. You can wear one while you do your normal daily activities. This is usually used to diagnose what is causing palpitations/syncope (passing out).    Follow-Up: At  [facility], you and your health needs are our priority.  As part of our continuing mission to provide you with exceptional heart care, we have created designated Provider Care Teams.  These Care Teams include your primary Cardiologist (physician) and Advanced Practice Providers (APPs -  Physician Assistants and Nurse Practitioners) who all work together to provide you with the care you need, when you need it.  We recommend signing up for the patient portal called "MyChart".  Sign up information is provided on this After Visit Summary.  MyChart is used to connect with patients for Virtual Visits (Telemedicine).  Patients are able to view lab/test results, encounter notes, upcoming appointments, etc.  Non-urgent messages can be sent to your provider as well.   To learn more about what you can do with MyChart, go to [email or web address].    Your next appointment:   6 month(s)  The format for your next appointment:   In Person  Provider:   Mertie Moores, MD    Other Instructions [employer] Cardiac Event Monitor Instructions Your physician has requested you wear your cardiac event monitor for ____30_ days,  (1-30). [employer] may call or text to confirm a shipping address. The monitor will be sent to a land address via UPS. [employer] will not ship a monitor to a PO BOX. It typically takes 3-5 days to receive your monitor after it has been enrolled. [employer] will assist with USPS tracking if your package is delayed. The telephone number for [employer] is [phone]. Once you have received your monitor, please review the enclosed instructions. Instruction tutorials can also be viewed under help and settings on the enclosed cell phone. Your monitor has already been registered assigning a specific monitor serial # to you.  Applying the monitor Remove cell phone from case and turn it on. The cell phone works as Dealer and needs to be within [employer] of you at all times. The cell phone will need to be charged on a daily basis. We recommend you plug the cell phone into the enclosed charger at your bedside table every night.  Monitor batteries: You will receive two monitor batteries labelled #1 and #2. These are your recorders. Plug battery #2 onto the second connection on the enclosed charger. Keep one battery on the charger at all times. This will keep the monitor battery deactivated. It will also keep it fully charged for when you need to switch your monitor batteries. A small light will be blinking on the battery emblem when it is charging. The light on the battery emblem will remain on when the battery is fully charged.  Open package of a Monitor strip. Insert battery #1 into black hood on strip and gently squeeze monitor battery onto connection as indicated in instruction booklet. Set aside  while preparing skin.  Choose location for your strip, vertical or horizontal, as indicated in the instruction booklet. Shave to remove all hair from location. There cannot be any lotions, oils, powders, or colognes on skin where monitor is to be applied. Wipe skin clean with enclosed Saline  wipe. Dry skin completely.  Peel paper labeled #1 off the back of the Monitor strip exposing the adhesive. Place the monitor on the chest in the vertical or horizontal position shown in the instruction booklet. One arrow on the monitor strip must be pointing upward. Carefully remove paper labeled #2, attaching remainder of strip to your skin. Try not to create any folds or wrinkles in the strip as you apply it.  Firmly press and release the circle in the center of the monitor battery. You will hear a small beep. This is turning the monitor battery on. The heart emblem on the monitor battery will light up every 5 seconds if the monitor battery in turned on and connected to the patient securely. Do not push and hold the circle down as this turns the monitor battery off. The cell phone will locate the monitor battery. A screen will appear on the cell phone checking the connection of your monitor strip. This may read poor connection initially but change to good connection within the next minute. Once your monitor accepts the connection you will hear a series of 3 beeps followed by a climbing crescendo of beeps. A screen will appear on the cell phone showing the two monitor strip placement options. Touch the picture that demonstrates where you applied the monitor strip.  Your monitor strip and battery are waterproof. You are able to shower, bathe, or swim with the monitor on. They just ask you do not submerge deeper than 3 feet underwater. We recommend removing the monitor if you are swimming in a lake, river, or ocean.  Your monitor battery will need to be switched to a fully charged monitor battery approximately once a week. The cell phone will alert you of an action which needs to be made.  On the cell phone, tap for details to reveal connection status, monitor battery status, and cell phone battery status. The green dots indicates your monitor is in good status. A red dot indicates there is  something that needs your attention.  To record a symptom, click the circle on the monitor battery. In 30-60 seconds a list of symptoms will appear on the cell phone. Select your symptom and tap save. Your monitor will record a sustained or significant arrhythmia regardless of you clicking the button. Some patients do not feel the heart rhythm irregularities. [employer] will notify us of any serious or critical events.  Refer to instruction booklet for instructions on switching batteries, changing strips, the Do not disturb or Pause features, or any additional questions.  Call [employer] at [phone], to confirm your monitor is transmitting and record your baseline. They will answer any questions you may have regarding the monitor instructions at that time.  Returning the monitor to [place] all equipment back into blue box. Peel off strip of paper to expose adhesive and close box securely. There is a prepaid UPS shipping label on this box. Drop in a UPS drop box, or at a UPS facility like [employer]. You may also contact [employer] to arrange UPS to pick up monitor package at your home.

## 2022-02-16 ENCOUNTER — Telehealth: Payer: Self-pay | Admitting: *Deleted

## 2022-02-16 ENCOUNTER — Other Ambulatory Visit: Payer: Self-pay | Admitting: Cardiovascular Disease

## 2022-02-16 ENCOUNTER — Encounter: Payer: Self-pay | Admitting: *Deleted

## 2022-02-16 DIAGNOSIS — R2681 Unsteadiness on feet: Secondary | ICD-10-CM | POA: Diagnosis not present

## 2022-02-16 DIAGNOSIS — A419 Sepsis, unspecified organism: Secondary | ICD-10-CM | POA: Diagnosis not present

## 2022-02-16 DIAGNOSIS — I639 Cerebral infarction, unspecified: Secondary | ICD-10-CM

## 2022-02-16 DIAGNOSIS — R1310 Dysphagia, unspecified: Secondary | ICD-10-CM | POA: Diagnosis not present

## 2022-02-16 DIAGNOSIS — M6281 Muscle weakness (generalized): Secondary | ICD-10-CM | POA: Diagnosis not present

## 2022-02-16 DIAGNOSIS — R4701 Aphasia: Secondary | ICD-10-CM | POA: Diagnosis not present

## 2022-02-16 DIAGNOSIS — R262 Difficulty in walking, not elsewhere classified: Secondary | ICD-10-CM | POA: Diagnosis not present

## 2022-02-16 DIAGNOSIS — I4891 Unspecified atrial fibrillation: Secondary | ICD-10-CM

## 2022-02-16 DIAGNOSIS — I4510 Unspecified right bundle-branch block: Secondary | ICD-10-CM

## 2022-02-16 NOTE — Telephone Encounter (Signed)
Spoke with nurses station at [facility]. Patient enrolled for [employer] to ship a 30 day cardiac event monitor to their facility.  [facility] in care of [facility], [place] [place], [place]  [address] [phone]  Attn: Unit Coordinator, Dairl Ponder, RN.  Letter with instructions mailed to same address.

## 2022-02-17 DIAGNOSIS — R4702 Dysphasia: Secondary | ICD-10-CM | POA: Diagnosis not present

## 2022-02-17 DIAGNOSIS — R2681 Unsteadiness on feet: Secondary | ICD-10-CM | POA: Diagnosis not present

## 2022-02-17 DIAGNOSIS — I639 Cerebral infarction, unspecified: Secondary | ICD-10-CM | POA: Diagnosis not present

## 2022-02-17 DIAGNOSIS — M6281 Muscle weakness (generalized): Secondary | ICD-10-CM | POA: Diagnosis not present

## 2022-02-17 DIAGNOSIS — I5032 Chronic diastolic (congestive) heart failure: Secondary | ICD-10-CM | POA: Diagnosis not present

## 2022-02-17 DIAGNOSIS — I509 Heart failure, unspecified: Secondary | ICD-10-CM | POA: Diagnosis not present

## 2022-02-17 DIAGNOSIS — R262 Difficulty in walking, not elsewhere classified: Secondary | ICD-10-CM | POA: Diagnosis not present

## 2022-02-17 DIAGNOSIS — R5381 Other malaise: Secondary | ICD-10-CM | POA: Diagnosis not present

## 2022-02-17 DIAGNOSIS — D631 Anemia in chronic kidney disease: Secondary | ICD-10-CM | POA: Diagnosis not present

## 2022-02-17 DIAGNOSIS — R4701 Aphasia: Secondary | ICD-10-CM | POA: Diagnosis not present

## 2022-02-17 DIAGNOSIS — I471 Supraventricular tachycardia: Secondary | ICD-10-CM | POA: Diagnosis not present

## 2022-02-17 DIAGNOSIS — R600 Localized edema: Secondary | ICD-10-CM | POA: Diagnosis not present

## 2022-02-17 DIAGNOSIS — E876 Hypokalemia: Secondary | ICD-10-CM | POA: Diagnosis not present

## 2022-02-17 DIAGNOSIS — N1832 Chronic kidney disease, stage 3b: Secondary | ICD-10-CM | POA: Diagnosis not present

## 2022-02-17 DIAGNOSIS — R1310 Dysphagia, unspecified: Secondary | ICD-10-CM | POA: Diagnosis not present

## 2022-02-17 DIAGNOSIS — E611 Iron deficiency: Secondary | ICD-10-CM | POA: Diagnosis not present

## 2022-02-17 DIAGNOSIS — A419 Sepsis, unspecified organism: Secondary | ICD-10-CM | POA: Diagnosis not present

## 2022-02-18 DIAGNOSIS — R1310 Dysphagia, unspecified: Secondary | ICD-10-CM | POA: Diagnosis not present

## 2022-02-18 DIAGNOSIS — R2681 Unsteadiness on feet: Secondary | ICD-10-CM | POA: Diagnosis not present

## 2022-02-18 DIAGNOSIS — M6281 Muscle weakness (generalized): Secondary | ICD-10-CM | POA: Diagnosis not present

## 2022-02-18 DIAGNOSIS — R262 Difficulty in walking, not elsewhere classified: Secondary | ICD-10-CM | POA: Diagnosis not present

## 2022-02-18 DIAGNOSIS — I69398 Other sequelae of cerebral infarction: Secondary | ICD-10-CM | POA: Diagnosis not present

## 2022-02-18 DIAGNOSIS — I639 Cerebral infarction, unspecified: Secondary | ICD-10-CM | POA: Diagnosis not present

## 2022-02-18 DIAGNOSIS — A419 Sepsis, unspecified organism: Secondary | ICD-10-CM | POA: Diagnosis not present

## 2022-02-18 DIAGNOSIS — I351 Nonrheumatic aortic (valve) insufficiency: Secondary | ICD-10-CM | POA: Diagnosis not present

## 2022-02-18 DIAGNOSIS — R5381 Other malaise: Secondary | ICD-10-CM | POA: Diagnosis not present

## 2022-02-18 DIAGNOSIS — R4701 Aphasia: Secondary | ICD-10-CM | POA: Diagnosis not present

## 2022-02-18 NOTE — Progress Notes (Unsigned)
[name] [name] [address] [place] Phone: [phone] Subjective:   IVilma [name], am serving as a scribe for Dr. Hulan Saas.  I'm seeing this patient by the request  of:  Biagio Borg, MD  CC: knee pain follow up   NTI:[id number]  12/29/2021 Patient is to be following up with neurology in the near future.  In a long-term care facility.  Possibly moving to [place].  Bilateral knees injected today.  Chronic problem with exacerbation.  Patient does have fairly significant aphasia.  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  Will get CMET to further evaluate blood glucose, kidney function as well.  Follow-up with me again in 2 to 3 months possibility that patient may be moving  Updated 02/23/2022 [name] is a 71 y.o. male coming in with complaint of bilateral knee pain. Not doing too well today. Knees are swollen like usual.  No other issues.  Patient has not been as much active.  States that he has been trying to work with the physical therapist and his rehabilitation center.  Does not know if it has been helpful or not.       Past Medical History:  Diagnosis Date   Anemia    normal Fe, nl B12, nl retic, nl EPO July '13   Aortic regurgitation    Moderate AI 04/17/19 echo   Blood transfusion without reported diagnosis    BPH (benign prostatic hyperplasia)    Chronic back pain    Chronic kidney disease    CKD III, obstructive nephropathy   Diverticulosis    Dysuria    Elevated PSA, greater than or equal to 20 ng/ml June '13   PSA 107   Hemorrhoids, internal, with bleeding, prolapse 09/19/2014   Hyperlipidemia 05/29/2014   Hypertension    Obstructive uropathy 11/27/2015   Pre-diabetes    per patient "reduced sugar intake"   Renal insufficiency    Tuberculosis    h/o PPD +   UTI (urinary tract infection)    Vitamin D deficiency 09/13/2017   Past Surgical History:  Procedure  Laterality Date   CARPAL TUNNEL RELEASE Right    COLONOSCOPY     FLEXIBLE SIGMOIDOSCOPY N/A 01/26/2022   Procedure: FLEXIBLE SIGMOIDOSCOPY;  Surgeon: Jerene Bears, MD;  Location: [facility];  Service: Gastroenterology;  Laterality: N/A;   HEMORRHOID BANDING     INSERTION OF MESH N/A 10/17/2019   Procedure: Insertion Of Mesh;  Surgeon: Ralene Ok, MD;  Location: [facility];  Service: General;  Laterality: N/A;   SPLENECTOMY     XI ROBOTIC ASSISTED SIMPLE PROSTATECTOMY N/A 03/09/2019   Procedure: XI ROBOTIC ASSISTED SIMPLE PROSTATECTOMY;  Surgeon: Cleon Gustin, MD;  Location: [facility];  Service: Urology;  Laterality: N/A;   XI ROBOTIC ASSISTED VENTRAL HERNIA N/A 10/17/2019   Procedure: XI ROBOTIC ASSISTED INCISIONAL HERNIA REPAIR WITH MESH;  Surgeon: Ralene Ok, MD;  Location: [facility];  Service: General;  Laterality: N/A;   Social History   Socioeconomic History   Marital status: Single    Spouse name: Not on file   Number of children: 2   Years of education: 20   Highest education level: Some college, no degree  Occupational History   Occupation: Lobbyist: [employer]    Comment: unemployed  Tobacco Use   Smoking status: Former    Types: Cigarettes    Quit  date: 02/26/1980    Years since quitting: 42.0   Smokeless tobacco: Never  Vaping Use   Vaping Use: Never used  Substance and Sexual Activity   Alcohol use: No    Alcohol/week: 0.0 standard drinks of alcohol   Drug use: No   Sexual activity: Not Currently  Other Topics Concern   Not on file  Social History Narrative   Native of [place], raised poor farming community. . To [place] '78 - finished College, all but Arts administrator. Married - wife in [place], blocked from [facility] to [place]. 1 son '90; 1 dtr '93. 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 Resources depleted.   Right handed.   Caffeine Hot daily one daily.   Social Determinants of Health   Financial  Resource Strain: Low Risk  (08/10/2021)   Overall Financial Resource Strain (CARDIA)    Difficulty of Paying Living Expenses: Not hard at all  Food Insecurity: No Food Insecurity (08/10/2021)   Hunger Vital Sign    Worried About Running Out of Food in the Last Year: Never true    Ran Out of Food in the Last Year: Never true  Transportation Needs: No Transportation Needs (12/02/2021)   PRAPARE - Hydrologist (Medical): No    Lack of Transportation (Non-Medical): No  Physical Activity: Sufficiently Active (08/10/2021)   Exercise Vital Sign    Days of Exercise per Week: 5 days    Minutes of Exercise per Session: 30 min  Stress: No Stress Concern Present (08/10/2021)   [place]    Feeling of Stress : Not at all  Social Connections: Unknown (08/10/2021)   Social Connection and Isolation Panel [NHANES]    Frequency of Communication with Friends and Family: More than three times a week    Frequency of Social Gatherings with Friends and Family: Once a week    Attends Religious Services: Never    Marine scientist or Organizations: No    Attends Music therapist: Never    Marital Status: Not on file   Allergies  Allergen Reactions   Chicken Meat (Diagnostic) Other (See Comments)    Not listed on MAR   Chicken Protein     No chicken products No eggs No port   Pork-Derived Products Other (See Comments)   Family History  Problem Relation Age of Onset   Diabetes Mother    Stroke Brother    Hearing loss Brother    Colon cancer Neg Hx    Rectal cancer Neg Hx    Stomach cancer Neg Hx    Esophageal cancer Neg Hx      Current Outpatient Medications (Cardiovascular):    amLODipine (NORVASC) 10 MG tablet, Take 1 tablet (10 mg total) by mouth daily.   atorvastatin (LIPITOR) 80 MG tablet, Take 1 tablet (80 mg total) by mouth daily.   furosemide (LASIX) 40 MG tablet, Take 1 tablet (40  mg total) by mouth daily.   hydrALAZINE (APRESOLINE) 10 MG tablet, Take 10 mg by mouth 2 (two) times daily.   metoprolol tartrate (LOPRESSOR) 50 MG tablet, Take 1 tablet by mouth twice daily   Current Outpatient Medications (Analgesics):    acetaminophen (TYLENOL) 325 MG tablet, Take 2 tablets (650 mg total) by mouth every 6 (six) hours as needed for mild pain, moderate pain, fever or headache.   allopurinol (ZYLOPRIM) 100 MG tablet, Take 1 tablet (100 mg total)  by mouth daily.   traMADol (ULTRAM) 50 MG tablet, Take 1 tablet (50 mg total) by mouth every 12 (twelve) hours as needed for severe pain.  Current Outpatient Medications (Hematological):    clopidogrel (PLAVIX) 75 MG tablet, Take 1 tablet (75 mg total) by mouth daily.  Current Outpatient Medications (Other):    cholecalciferol (VITAMIN D3) 25 MCG (1000 UNIT) tablet, Take 1,000 Units by mouth daily.   diclofenac Sodium (VOLTAREN) 1 % GEL, Apply 3 g topically 2 (two) times daily.   magnesium oxide (MAG-OX) 400 (240 Mg) MG tablet, Take 400 mg by mouth daily.   Multiple Vitamin (MULTIVITAMIN WITH MINERALS) TABS tablet, Take 1 tablet by mouth daily.   phenylephrine-shark liver oil-mineral oil-petrolatum (PREPARATION H) 0.25-14-74.9 % rectal ointment, Place 1 Application rectally 2 (two) times daily as needed for hemorrhoids.   polyethylene glycol (MIRALAX / GLYCOLAX) 17 g packet, Take 17 g by mouth daily.   polyvinyl alcohol (ARTIFICIAL TEARS) 1.4 % ophthalmic solution, Place 2 drops into both eyes as needed (Reason not listed on MAR).   potassium chloride SA (KLOR-CON M) 20 MEQ tablet, Take 20 mEq by mouth daily.   senna-docusate (SENOKOT-S) 8.6-50 MG tablet, Take 2 tablets by mouth 2 (two) times daily.   tamsulosin (FLOMAX) 0.4 MG CAPS capsule, Take 0.4 mg by mouth daily.   Reviewed prior external information including notes and imaging from  primary care provider As well as notes that were available from care everywhere and other  healthcare systems.  Past medical history, social, surgical and family history all reviewed in electronic medical record.  No pertanent information unless stated regarding to the chief complaint.   Review of Systems:  No headache, visual changes, nausea, vomiting, diarrhea, constipation, dizziness, abdominal pain, skin rash, fevers, chills, night sweats, weight loss, swollen lymph nodes, chest pain, shortness of breath, mood changes. POSITIVE muscle aches body aches, joint swelling  Objective  Blood pressure 114/68, pulse 81, height '6\' 1"'$  (1.854 m), SpO2 96 %.   General: No apparent distress alert and oriented x3 mood and affect normal, dressed appropriately.  Patient is having most some difficulty with his speech. HEENT: Pupils equal, extraocular movements intact  Respiratory: Patient's speak in full sentences and does not appear short of breath  Patient is sitting in a wheelchair at the most of the time.  Does have lower extremity swelling noted. Knee exam bilaterally shows the patient does have an effusion bilaterally.  Limited range of motion in all planes.  Does have some instability noted with valgus and varus force  After informed written and verbal consent, patient was seated on exam table. Right knee was prepped with alcohol swab and utilizing anterolateral approach, patient's right knee space was injected with 4:1  marcaine 0.5%: Kenalog '40mg'$ /dL. Patient tolerated the procedure well without immediate complications.  After informed written and verbal consent, patient was seated on exam table. Left knee was prepped with alcohol swab and utilizing anterolateral approach, patient's left knee space was injected with 4:1  marcaine 0.5%: Kenalog '40mg'$ /dL. Patient tolerated the procedure well without immediate complications.     Impression and Recommendations:    The above documentation has been reviewed and is accurate and complete Lyndal Pulley, DO

## 2022-02-19 DIAGNOSIS — Z79899 Other long term (current) drug therapy: Secondary | ICD-10-CM | POA: Diagnosis not present

## 2022-02-19 DIAGNOSIS — A419 Sepsis, unspecified organism: Secondary | ICD-10-CM | POA: Diagnosis not present

## 2022-02-19 DIAGNOSIS — I639 Cerebral infarction, unspecified: Secondary | ICD-10-CM | POA: Diagnosis not present

## 2022-02-19 DIAGNOSIS — R2681 Unsteadiness on feet: Secondary | ICD-10-CM | POA: Diagnosis not present

## 2022-02-19 DIAGNOSIS — R1310 Dysphagia, unspecified: Secondary | ICD-10-CM | POA: Diagnosis not present

## 2022-02-19 DIAGNOSIS — M6281 Muscle weakness (generalized): Secondary | ICD-10-CM | POA: Diagnosis not present

## 2022-02-19 DIAGNOSIS — R262 Difficulty in walking, not elsewhere classified: Secondary | ICD-10-CM | POA: Diagnosis not present

## 2022-02-19 DIAGNOSIS — R4701 Aphasia: Secondary | ICD-10-CM | POA: Diagnosis not present

## 2022-02-20 DIAGNOSIS — R2681 Unsteadiness on feet: Secondary | ICD-10-CM | POA: Diagnosis not present

## 2022-02-20 DIAGNOSIS — I639 Cerebral infarction, unspecified: Secondary | ICD-10-CM | POA: Diagnosis not present

## 2022-02-20 DIAGNOSIS — A419 Sepsis, unspecified organism: Secondary | ICD-10-CM | POA: Diagnosis not present

## 2022-02-20 DIAGNOSIS — R262 Difficulty in walking, not elsewhere classified: Secondary | ICD-10-CM | POA: Diagnosis not present

## 2022-02-20 DIAGNOSIS — R4701 Aphasia: Secondary | ICD-10-CM | POA: Diagnosis not present

## 2022-02-20 DIAGNOSIS — M6281 Muscle weakness (generalized): Secondary | ICD-10-CM | POA: Diagnosis not present

## 2022-02-20 DIAGNOSIS — R1310 Dysphagia, unspecified: Secondary | ICD-10-CM | POA: Diagnosis not present

## 2022-02-21 DIAGNOSIS — R2681 Unsteadiness on feet: Secondary | ICD-10-CM | POA: Diagnosis not present

## 2022-02-21 DIAGNOSIS — I639 Cerebral infarction, unspecified: Secondary | ICD-10-CM | POA: Diagnosis not present

## 2022-02-21 DIAGNOSIS — A419 Sepsis, unspecified organism: Secondary | ICD-10-CM | POA: Diagnosis not present

## 2022-02-21 DIAGNOSIS — M6281 Muscle weakness (generalized): Secondary | ICD-10-CM | POA: Diagnosis not present

## 2022-02-21 DIAGNOSIS — R4701 Aphasia: Secondary | ICD-10-CM | POA: Diagnosis not present

## 2022-02-21 DIAGNOSIS — R262 Difficulty in walking, not elsewhere classified: Secondary | ICD-10-CM | POA: Diagnosis not present

## 2022-02-21 DIAGNOSIS — R1310 Dysphagia, unspecified: Secondary | ICD-10-CM | POA: Diagnosis not present

## 2022-02-22 DIAGNOSIS — I639 Cerebral infarction, unspecified: Secondary | ICD-10-CM | POA: Diagnosis not present

## 2022-02-22 DIAGNOSIS — R262 Difficulty in walking, not elsewhere classified: Secondary | ICD-10-CM | POA: Diagnosis not present

## 2022-02-22 DIAGNOSIS — A419 Sepsis, unspecified organism: Secondary | ICD-10-CM | POA: Diagnosis not present

## 2022-02-22 DIAGNOSIS — M6281 Muscle weakness (generalized): Secondary | ICD-10-CM | POA: Diagnosis not present

## 2022-02-22 DIAGNOSIS — R2681 Unsteadiness on feet: Secondary | ICD-10-CM | POA: Diagnosis not present

## 2022-02-22 DIAGNOSIS — R4701 Aphasia: Secondary | ICD-10-CM | POA: Diagnosis not present

## 2022-02-22 DIAGNOSIS — R1310 Dysphagia, unspecified: Secondary | ICD-10-CM | POA: Diagnosis not present

## 2022-02-23 ENCOUNTER — Ambulatory Visit (INDEPENDENT_AMBULATORY_CARE_PROVIDER_SITE_OTHER): Payer: Medicare Other | Admitting: Family Medicine

## 2022-02-23 ENCOUNTER — Ambulatory Visit: Payer: Self-pay

## 2022-02-23 VITALS — BP 114/68 | HR 81 | Ht 73.0 in

## 2022-02-23 DIAGNOSIS — R4701 Aphasia: Secondary | ICD-10-CM | POA: Diagnosis not present

## 2022-02-23 DIAGNOSIS — M25561 Pain in right knee: Secondary | ICD-10-CM

## 2022-02-23 DIAGNOSIS — G8929 Other chronic pain: Secondary | ICD-10-CM

## 2022-02-23 DIAGNOSIS — R2681 Unsteadiness on feet: Secondary | ICD-10-CM | POA: Diagnosis not present

## 2022-02-23 DIAGNOSIS — R262 Difficulty in walking, not elsewhere classified: Secondary | ICD-10-CM | POA: Diagnosis not present

## 2022-02-23 DIAGNOSIS — A419 Sepsis, unspecified organism: Secondary | ICD-10-CM | POA: Diagnosis not present

## 2022-02-23 DIAGNOSIS — R1310 Dysphagia, unspecified: Secondary | ICD-10-CM | POA: Diagnosis not present

## 2022-02-23 DIAGNOSIS — M6281 Muscle weakness (generalized): Secondary | ICD-10-CM | POA: Diagnosis not present

## 2022-02-23 DIAGNOSIS — I639 Cerebral infarction, unspecified: Secondary | ICD-10-CM | POA: Diagnosis not present

## 2022-02-23 DIAGNOSIS — M170 Bilateral primary osteoarthritis of knee: Secondary | ICD-10-CM | POA: Diagnosis not present

## 2022-02-23 NOTE — Patient Instructions (Signed)
Injection in both knees today  Anytime you're laying down try to get feet above heart

## 2022-02-23 NOTE — Assessment & Plan Note (Signed)
Significant arthritic changes bilaterally.  Bilateral injections given today.  Warned of potential side effects.  Patient is not quite the same and not as active anymore sitting in the wheelchair than usual.  This seems to be secondary to his CVA.  Discussed with patient that at this time we are just helping with the pain with these injections.  Patient likely is not a surgical candidate at the moment.  Can repeat these injections every 8 to 10 weeks if needed.

## 2022-02-24 ENCOUNTER — Ambulatory Visit: Payer: Medicare Other | Attending: Cardiovascular Disease

## 2022-02-24 DIAGNOSIS — R262 Difficulty in walking, not elsewhere classified: Secondary | ICD-10-CM | POA: Diagnosis not present

## 2022-02-24 DIAGNOSIS — I509 Heart failure, unspecified: Secondary | ICD-10-CM | POA: Diagnosis not present

## 2022-02-24 DIAGNOSIS — I639 Cerebral infarction, unspecified: Secondary | ICD-10-CM | POA: Diagnosis not present

## 2022-02-24 DIAGNOSIS — I4510 Unspecified right bundle-branch block: Secondary | ICD-10-CM

## 2022-02-24 DIAGNOSIS — R1310 Dysphagia, unspecified: Secondary | ICD-10-CM | POA: Diagnosis not present

## 2022-02-24 DIAGNOSIS — R5381 Other malaise: Secondary | ICD-10-CM | POA: Diagnosis not present

## 2022-02-24 DIAGNOSIS — M6281 Muscle weakness (generalized): Secondary | ICD-10-CM | POA: Diagnosis not present

## 2022-02-24 DIAGNOSIS — A419 Sepsis, unspecified organism: Secondary | ICD-10-CM | POA: Diagnosis not present

## 2022-02-24 DIAGNOSIS — R600 Localized edema: Secondary | ICD-10-CM | POA: Diagnosis not present

## 2022-02-24 DIAGNOSIS — R2681 Unsteadiness on feet: Secondary | ICD-10-CM | POA: Diagnosis not present

## 2022-02-24 DIAGNOSIS — I4891 Unspecified atrial fibrillation: Secondary | ICD-10-CM

## 2022-02-24 DIAGNOSIS — R4701 Aphasia: Secondary | ICD-10-CM | POA: Diagnosis not present

## 2022-02-24 DIAGNOSIS — R4702 Dysphasia: Secondary | ICD-10-CM | POA: Diagnosis not present

## 2022-02-25 DIAGNOSIS — R4701 Aphasia: Secondary | ICD-10-CM | POA: Diagnosis not present

## 2022-02-25 DIAGNOSIS — I639 Cerebral infarction, unspecified: Secondary | ICD-10-CM | POA: Diagnosis not present

## 2022-02-25 DIAGNOSIS — R1310 Dysphagia, unspecified: Secondary | ICD-10-CM | POA: Diagnosis not present

## 2022-02-25 DIAGNOSIS — R2681 Unsteadiness on feet: Secondary | ICD-10-CM | POA: Diagnosis not present

## 2022-02-25 DIAGNOSIS — A419 Sepsis, unspecified organism: Secondary | ICD-10-CM | POA: Diagnosis not present

## 2022-02-25 DIAGNOSIS — R262 Difficulty in walking, not elsewhere classified: Secondary | ICD-10-CM | POA: Diagnosis not present

## 2022-02-25 DIAGNOSIS — M6281 Muscle weakness (generalized): Secondary | ICD-10-CM | POA: Diagnosis not present

## 2022-02-28 ENCOUNTER — Telehealth: Payer: Self-pay | Admitting: Home Health

## 2022-02-28 DIAGNOSIS — R000 Tachycardia, unspecified: Secondary | ICD-10-CM | POA: Diagnosis not present

## 2022-02-28 DIAGNOSIS — R404 Transient alteration of awareness: Secondary | ICD-10-CM | POA: Diagnosis not present

## 2022-02-28 DIAGNOSIS — I499 Cardiac arrhythmia, unspecified: Secondary | ICD-10-CM | POA: Diagnosis not present

## 2022-02-28 DIAGNOSIS — Z743 Need for continuous supervision: Secondary | ICD-10-CM | POA: Diagnosis not present

## 2022-02-28 DIAGNOSIS — I469 Cardiac arrest, cause unspecified: Secondary | ICD-10-CM | POA: Diagnosis not present

## 2022-03-07 NOTE — Telephone Encounter (Signed)
[employer] rep called after hour line today, reporting patient had asystole on 5:58AM on 2022/03/09, there was some a fib episodes noted at the end of recording, 911 was dispatched, officer arrived at the scene reports patient was deceased upon their assessment.

## 2022-03-07 DEATH — deceased

## 2022-03-29 ENCOUNTER — Ambulatory Visit (HOSPITAL_COMMUNITY): Admission: RE | Admit: 2022-03-29 | Payer: Medicare Other | Source: Ambulatory Visit

## 2022-04-08 ENCOUNTER — Telehealth: Payer: Self-pay | Admitting: Cardiovascular Disease

## 2022-04-08 NOTE — Telephone Encounter (Signed)
Unable to reach patient at primary number. Called contact Shanon Brow on DPR who states that patient passed away 03-06-22. There is a note from on call NP from this date when [place] picked up asystole on monitor: 03-06-22 XZ   06-Mar-2022  7:52 AM Margie Billet, NP routed this conversation to Nahser, Wonda Cheng, MD Margie Billet, NP   Carollee Sires   Mar 06, 2022  7:52 AM Note [employer] rep called after hour line today, reporting patient had asystole on 5:58AM on 03-06-22, there was some a fib episodes noted at the end of recording, 911 was dispatched, officer arrived at the scene reports patient was deceased upon their assessment.      Will route to Medical Records Dept/[facility] so that they can update chart to reflect deceased status.

## 2022-04-08 NOTE — Telephone Encounter (Signed)
-----   Message from Thayer Headings, MD sent at 04/07/2022  1:46 PM EDT ----- Event monitor shows atrial fib DC plavix  Start eliquis 5 mg  po BID

## 2022-04-27 ENCOUNTER — Ambulatory Visit: Payer: Medicare Other | Admitting: Family Medicine
